# Patient Record
Sex: Female | Born: 1985 | Race: Black or African American | Hispanic: No | Marital: Single
Health system: Southern US, Academic
[De-identification: ages and names within clinical notes are randomized; demographics above are authoritative.]

## PROBLEM LIST (undated history)

## (undated) ENCOUNTER — Inpatient Hospital Stay (HOSPITAL_COMMUNITY): Payer: Self-pay

## (undated) ENCOUNTER — Telehealth

## (undated) ENCOUNTER — Encounter

## (undated) ENCOUNTER — Ambulatory Visit

## (undated) ENCOUNTER — Encounter: Attending: Nephrology | Primary: Nephrology

## (undated) ENCOUNTER — Inpatient Hospital Stay

## (undated) ENCOUNTER — Ambulatory Visit: Attending: Obstetrics & Gynecology | Primary: Obstetrics & Gynecology

## (undated) ENCOUNTER — Ambulatory Visit: Payer: MEDICARE

## (undated) ENCOUNTER — Telehealth: Payer: MEDICARE

## (undated) ENCOUNTER — Encounter: Attending: Obstetrics & Gynecology | Primary: Obstetrics & Gynecology

## (undated) ENCOUNTER — Encounter: Attending: Infectious Disease | Primary: Infectious Disease

## (undated) ENCOUNTER — Telehealth: Attending: Nephrology | Primary: Nephrology

## (undated) ENCOUNTER — Encounter: Attending: Emergency | Primary: Emergency

## (undated) ENCOUNTER — Ambulatory Visit: Payer: MEDICARE | Attending: Nephrology | Primary: Nephrology

## (undated) ENCOUNTER — Ambulatory Visit: Payer: MEDICARE | Attending: Obstetrics & Gynecology | Primary: Obstetrics & Gynecology

## (undated) ENCOUNTER — Encounter: Attending: Physician Assistant | Primary: Physician Assistant

## (undated) ENCOUNTER — Telehealth
Attending: Student in an Organized Health Care Education/Training Program | Primary: Student in an Organized Health Care Education/Training Program

## (undated) ENCOUNTER — Telehealth: Attending: Emergency | Primary: Emergency

## (undated) ENCOUNTER — Telehealth: Attending: Clinical | Primary: Clinical

## (undated) ENCOUNTER — Ambulatory Visit: Payer: MEDICARE | Attending: Infectious Disease | Primary: Infectious Disease

## (undated) ENCOUNTER — Ambulatory Visit: Payer: MEDICARE | Attending: Emergency | Primary: Emergency

## (undated) ENCOUNTER — Encounter: Payer: MEDICARE | Attending: Nephrology | Primary: Nephrology

## (undated) ENCOUNTER — Encounter: Attending: Maternal & Fetal Medicine | Primary: Maternal & Fetal Medicine

## (undated) ENCOUNTER — Encounter: Attending: Clinical | Primary: Clinical

## (undated) ENCOUNTER — Ambulatory Visit: Payer: MEDICARE | Attending: Hematology | Primary: Hematology

## (undated) ENCOUNTER — Encounter
Attending: Student in an Organized Health Care Education/Training Program | Primary: Student in an Organized Health Care Education/Training Program

## (undated) ENCOUNTER — Ambulatory Visit
Attending: Student in an Organized Health Care Education/Training Program | Primary: Student in an Organized Health Care Education/Training Program

## (undated) ENCOUNTER — Other Ambulatory Visit

## (undated) ENCOUNTER — Ambulatory Visit
Payer: MEDICARE | Attending: Student in an Organized Health Care Education/Training Program | Primary: Student in an Organized Health Care Education/Training Program

## (undated) ENCOUNTER — Ambulatory Visit: Payer: MEDICARE | Attending: Clinical | Primary: Clinical

## (undated) ENCOUNTER — Ambulatory Visit: Attending: Nephrology | Primary: Nephrology

## (undated) DIAGNOSIS — R519 Headache, unspecified: Secondary | ICD-10-CM

## (undated) DIAGNOSIS — N189 Chronic kidney disease, unspecified: Secondary | ICD-10-CM

## (undated) DIAGNOSIS — A0472 Enterocolitis due to Clostridium difficile, not specified as recurrent: Secondary | ICD-10-CM

## (undated) DIAGNOSIS — F329 Major depressive disorder, single episode, unspecified: Secondary | ICD-10-CM

## (undated) DIAGNOSIS — D649 Anemia, unspecified: Secondary | ICD-10-CM

## (undated) DIAGNOSIS — D573 Sickle-cell trait: Secondary | ICD-10-CM

## (undated) DIAGNOSIS — I10 Essential (primary) hypertension: Secondary | ICD-10-CM

## (undated) DIAGNOSIS — R51 Headache: Secondary | ICD-10-CM

## (undated) DIAGNOSIS — E11319 Type 2 diabetes mellitus with unspecified diabetic retinopathy without macular edema: Secondary | ICD-10-CM

## (undated) DIAGNOSIS — K219 Gastro-esophageal reflux disease without esophagitis: Secondary | ICD-10-CM

## (undated) DIAGNOSIS — F32A Depression, unspecified: Secondary | ICD-10-CM

## (undated) HISTORY — PX: KIDNEY TRANSPLANT: SHX239

## (undated) HISTORY — DX: Headache, unspecified: R51.9

## (undated) HISTORY — DX: Major depressive disorder, single episode, unspecified: F32.9

## (undated) HISTORY — DX: Sickle-cell trait: D57.3

## (undated) HISTORY — PX: TONSILLECTOMY AND ADENOIDECTOMY: SHX28

## (undated) HISTORY — PX: TONSILLECTOMY: SUR1361

## (undated) HISTORY — DX: Depression, unspecified: F32.A

## (undated) HISTORY — DX: Headache: R51

## (undated) MED ORDER — DIFICID 200 MG TABLET: 200 mg | tablet | Freq: Two times a day (BID) | 2 refills | 30 days

## (undated) MED ORDER — PEG 3350-ELECTROLYTES 236 GRAM-22.74 GRAM-6.74 GRAM-5.86 GRAM SOLUTION: mL | 0 refills | 0 days | Status: CN

---
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
## 2007-08-06 ENCOUNTER — Inpatient Hospital Stay (HOSPITAL_COMMUNITY): Admission: EM | Admit: 2007-08-06 | Discharge: 2007-08-08 | Payer: Self-pay | Admitting: Internal Medicine

## 2007-12-02 ENCOUNTER — Emergency Department (HOSPITAL_COMMUNITY): Admission: EM | Admit: 2007-12-02 | Discharge: 2007-12-02 | Payer: Self-pay | Admitting: Family Medicine

## 2008-02-25 ENCOUNTER — Other Ambulatory Visit: Admission: RE | Admit: 2008-02-25 | Discharge: 2008-02-25 | Payer: Self-pay | Admitting: Obstetrics and Gynecology

## 2009-10-15 ENCOUNTER — Emergency Department (HOSPITAL_COMMUNITY): Admission: EM | Admit: 2009-10-15 | Discharge: 2009-10-15 | Payer: Self-pay | Admitting: Family Medicine

## 2009-10-20 ENCOUNTER — Inpatient Hospital Stay (HOSPITAL_COMMUNITY): Admission: AD | Admit: 2009-10-20 | Discharge: 2009-10-20 | Payer: Self-pay | Admitting: Obstetrics & Gynecology

## 2009-10-20 ENCOUNTER — Ambulatory Visit: Payer: Self-pay | Admitting: Gynecology

## 2010-04-26 LAB — WET PREP, GENITAL: Yeast Wet Prep HPF POC: NONE SEEN

## 2010-04-26 LAB — GLUCOSE, CAPILLARY: Glucose-Capillary: 318 mg/dL — ABNORMAL HIGH (ref 70–99)

## 2010-04-26 LAB — GC/CHLAMYDIA PROBE AMP, GENITAL: Chlamydia, DNA Probe: POSITIVE — AB

## 2010-06-26 NOTE — H&P (Signed)
NAMELAURAASHLEY, [name]:  [id number]   MEDICAL RECORD NO.:  [id number]          PATIENT TYPE:  EMS   LOCATION:  [facility]                         FACILITY:  [facility]   PHYSICIAN:  Edythe Lynn, M.D.       DATE OF BIRTH:  10/08/1985   DATE OF ADMISSION:  08/05/2007  DATE OF DISCHARGE:                              HISTORY & PHYSICAL   PRIMARY CARE PHYSICIAN:  This patient does not have a primary care  physician.   PRIMARY ENDOCRINOLOGIST:  Dr. Elayne Snare.   CHIEF COMPLAINT:  Feeling sick.   HISTORY OF PRESENT ILLNESS:  Christine Reed is a 25 year old woman with past  medical history of diabetes mellitus type 1, who presents today to the  emergency room with complaints of sore throat, feeling sick, and that  was followed by abdominal pain and nausea.  She reports that her CBG  machine was reading high.  The patient states that she has been in  contact with her cousin who was diagnosed with swine flu at [place].  The patient denies any dyspnea or cough.   PAST MEDICAL HISTORY:  Diabetes mellitus type 1 and episode of DKA.   HOME MEDICATIONS:  1. Lantus 25 units twice a day.  2. Humalog sliding scale before each meal.   ALLERGIES:  No known drug allergies.   SOCIAL HISTORY:  The patient goes to college.  She does not smoke  cigarettes.  Does not drink alcohol.   FAMILY HISTORY:  Noncontributory.   REVIEW OF SYSTEMS:  As per HPI, all other systems reviewed negative.   PHYSICAL EXAM:  VITAL SIGNS:  On admission, temperature 101.1, blood  pressure 117/74, heart rate 130, respiratory rate above 25, and  saturation 97% on room air.  GENERAL APPEARANCE:  A well-developed, well-nourished in no acute  distress.  Alert and oriented to place, person, and time.  HEENT:  Head:  Normocephalic and atraumatic.  Pupils are equal, round,  reactive to light and accommodation.  Extraocular movements are intact.  Throat:  Clear.  NECK:  Supple.  No JVD.  CHEST:   Coarse but clear.  No wheezes or rhonchi or crackles.  HEART:  Tachycardiac and regular without murmurs, rubs, or gallops.  ABDOMEN:  Soft.  Minimal diffuse tenderness.  No rebound.  No guarding.  Bowel sounds are present.  LOWER EXTREMITIES:  Without edema.   Exam of the back of the throat indicate presence of some small vesicles.   ASSESSMENT AND PLAN:  1. Diabetic ketoacidosis.  Plan is to obtain STAT BMET to further      document.  The patient has ketones in the urine more than 80 and      she smells of acetone and she has CBGs above 500.  Her i-STAT in      the emergency room documented bicarbonate of 20 but she has got a      gap of about 14.  We will place the patient in step down unit.      Place her on  generous intravenous fluids as  well as oral water and      start her on a insulin drip.  2. Probable influenza.  Christine Reed has been in contact with someone      diagnosed with H1N1 novel influenza virus.  I have discussed her      case with Dr. Lars Mage from the infectious disease and we have      decided treat the patient with Tamiflu 75 mg twice a day for 5      days.  The patient's respiratory status will be closely monitored      and if she decompensates further we will send PCR testing from      nasal secretions to make sure that this indeed is the H1N1      influenza strain.      Edythe Lynn, M.D.  Electronically Signed     SL/MEDQ  D:  08/05/2007  T:  08/06/2007  Job:  [id number]   cc:   Elayne Snare, M.D.

## 2010-06-26 NOTE — Discharge Summary (Signed)
Christine Reed, Christine Reed                 ACCOUNT NO.:  [id number]   MEDICAL RECORD NO.:  [id number]         PATIENT TYPE:  LINP   LOCATION:                               FACILITY:  [facility]   PHYSICIAN:  Hind I Elsaid, MD      DATE OF BIRTH:  Aug 03, 1985   DATE OF ADMISSION:  08/06/2007  DATE OF DISCHARGE:  08/08/2007                               DISCHARGE SUMMARY   DISCHARGE DIAGNOSES:  1. Diabetic ketoacidosis.  2. Diabetes mellitus type 1.  3. Dehydration.  4. H1N1 virus,  swine flu positive.   MEDICATIONS:  1. Insulin Lantus 28 units in the morning and 25 units at bedtime.  2. Humalog 3 units t.i.d. subcu.  3. Insulin sliding-scale coverage.  4. Tamiflu 75 mg twice daily for 3 days.   DIAGNOSTICS:  Chest x-ray:  Bronchitic changes.   CONSULTATIONS:  None.   HISTORY OF PRESENT ILLNESS:  1. This is a 25 year old female with past medical history significant      for diabetes mellitus type 1, who presented to the emergency room      withfeeling sick, upper quadrant abdominal pain and nausea.  Her      CBG machine was reading very high.  She was admitted to step down .      The patient found to be in DKA.  The patient admitted to the      hospital and started on IV fluids and IV insulin drip with frequent      CBG.  The patient out of DKA and started resuming her diabetic      regimen.  Her CBG returned to baseline of 103 to 98.  Her      hemoglobin found to be 10.1 which is significantly high.  The      patient will be discharged with insulin Lantus 28 units subcu. in      the morning and 25 units subcu. at bedtime.  The patient was      advised to follow next week with Dr. Elayne Snare for further      adjustment of her CBG.  2. Possible H1N1 flu virus positive.  The patient started on Tamiflu.      At this time, H1N1 result is positive.  The patient was advised to      stay home and avoid public place during this duration of time.      During hospitalization, respiratory status  remained stable.  No      evidence of deteriorating on her breathing.   DISPOSITION:  The patient will be discharged home.   FOLLOW UP:  The patient will be asked to follow up with Dr. Elayne Snare  within 1 week.      Hind Franco Collet, MD  Electronically Signed     HIE/MEDQ  D:  08/08/2007  T:  08/08/2007  Job:  [id number]   cc:   Elayne Snare, M.D.  Fax: [phone]

## 2010-10-09 ENCOUNTER — Emergency Department (HOSPITAL_COMMUNITY)
Admission: EM | Admit: 2010-10-09 | Discharge: 2010-10-10 | Disposition: A | Discharge status: Home / Self Care | Payer: 59 | Attending: Emergency Medicine | Admitting: Emergency Medicine

## 2010-10-09 DIAGNOSIS — E109 Type 1 diabetes mellitus without complications: Secondary | ICD-10-CM | POA: Insufficient documentation

## 2010-10-09 DIAGNOSIS — Z794 Long term (current) use of insulin: Secondary | ICD-10-CM | POA: Insufficient documentation

## 2010-10-09 DIAGNOSIS — Z91199 Patient's noncompliance with other medical treatment and regimen due to unspecified reason: Secondary | ICD-10-CM | POA: Insufficient documentation

## 2010-10-09 DIAGNOSIS — Z9119 Patient's noncompliance with other medical treatment and regimen: Secondary | ICD-10-CM | POA: Insufficient documentation

## 2010-10-09 LAB — DIFFERENTIAL
Basophils Absolute: 0 10*3/uL (ref 0.0–0.1)
Eosinophils Absolute: 0.1 10*3/uL (ref 0.0–0.7)
Lymphocytes Relative: 25 % (ref 12–46)
Lymphs Abs: 3 10*3/uL (ref 0.7–4.0)
Neutrophils Relative %: 71 % (ref 43–77)

## 2010-10-09 LAB — POCT I-STAT, CHEM 8
BUN: 13 mg/dL (ref 6–23)
Calcium, Ion: 1.16 mmol/L (ref 1.12–1.32)
Chloride: 101 mEq/L (ref 96–112)
Potassium: 4.3 mEq/L (ref 3.5–5.1)

## 2010-10-09 LAB — GLUCOSE, CAPILLARY
Glucose-Capillary: 498 mg/dL — ABNORMAL HIGH (ref 70–99)
Glucose-Capillary: 515 mg/dL — ABNORMAL HIGH (ref 70–99)

## 2010-10-09 LAB — CBC
HCT: 34.2 % — ABNORMAL LOW (ref 36.0–46.0)
MCV: 84.2 fL (ref 78.0–100.0)
Platelets: 386 10*3/uL (ref 150–400)
RBC: 4.06 MIL/uL (ref 3.87–5.11)
WBC: 12.4 10*3/uL — ABNORMAL HIGH (ref 4.0–10.5)

## 2010-10-10 LAB — GLUCOSE, CAPILLARY: Glucose-Capillary: 307 mg/dL — ABNORMAL HIGH (ref 70–99)

## 2010-11-08 LAB — URINALYSIS, ROUTINE W REFLEX MICROSCOPIC
Glucose, UA: >1000 — AB
Ketones, ur: >80 — AB
Leukocytes, UA: NEGATIVE
Nitrite: NEGATIVE
Protein, ur: NEGATIVE
Urobilinogen, UA: 0.2

## 2010-11-08 LAB — DIFFERENTIAL
Basophils Absolute: 0
Basophils Relative: 0
Basophils Relative: 0
Eosinophils Absolute: 0.1
Eosinophils Relative: 1
Lymphocytes Relative: 17
Lymphs Abs: 0.7
Neutro Abs: 4.8
Neutrophils Relative %: 69
Neutrophils Relative %: 82 — ABNORMAL HIGH

## 2010-11-08 LAB — BASIC METABOLIC PANEL
BUN: 2 — ABNORMAL LOW
BUN: 5 — ABNORMAL LOW
BUN: 6
CO2: 15 — ABNORMAL LOW
CO2: 21
CO2: 22
CO2: 23
CO2: 24
CO2: 24
CO2: 24
CO2: 26
CO2: 27
Calcium: 8.2 — ABNORMAL LOW
Calcium: 8.4
Calcium: 8.7
Chloride: 104
Chloride: 104
Chloride: 104
Chloride: 104
Chloride: 105
Chloride: 106
Chloride: 108
Chloride: 108
Chloride: 108
Chloride: 109
Chloride: 112
Creatinine, Ser: 1.15
GFR calc Af Amer: >60
GFR calc Af Amer: >60
GFR calc Af Amer: >60
GFR calc Af Amer: >60
GFR calc Af Amer: >60
GFR calc Af Amer: >60
GFR calc Af Amer: >60
GFR calc Af Amer: >60
GFR calc non Af Amer: >60
GFR calc non Af Amer: >60
GFR calc non Af Amer: >60
GFR calc non Af Amer: >60
GFR calc non Af Amer: >60
GFR calc non Af Amer: >60
GFR calc non Af Amer: >60
Glucose, Bld: 281 — ABNORMAL HIGH
Glucose, Bld: 135 — ABNORMAL HIGH
Glucose, Bld: 138 — ABNORMAL HIGH
Glucose, Bld: 166 — ABNORMAL HIGH
Glucose, Bld: 171 — ABNORMAL HIGH
Glucose, Bld: 80
Potassium: 2.9 — ABNORMAL LOW
Potassium: 3.1 — ABNORMAL LOW
Potassium: 3.2 — ABNORMAL LOW
Potassium: 3.4 — ABNORMAL LOW
Potassium: 3.5
Potassium: 3.5
Potassium: 3.6
Potassium: 3.6
Potassium: 3.7
Potassium: 3.8
Potassium: 3.8
Potassium: 4.1
Sodium: 139
Sodium: 136
Sodium: 138
Sodium: 138
Sodium: 139
Sodium: 139
Sodium: 139
Sodium: 140
Sodium: 140
Sodium: 140
Sodium: 140

## 2010-11-08 LAB — IRON AND TIBC
Saturation Ratios: 8 — ABNORMAL LOW
TIBC: 293

## 2010-11-08 LAB — CBC
HCT: 37.7
HCT: 28.7 — ABNORMAL LOW
Hemoglobin: 10.1 — ABNORMAL LOW
Hemoglobin: 9.8 — ABNORMAL LOW
MCHC: 34.5
MCHC: 35.1
MCHC: 34.1
MCHC: 34.1
MCV: 90.1
Platelets: 301
RBC: 3.22 — ABNORMAL LOW
RBC: 3.2 — ABNORMAL LOW
RDW: 13.7
RDW: 13.3
WBC: 4.9

## 2010-11-08 LAB — POCT I-STAT, CHEM 8
Calcium, Ion: 1.08 — ABNORMAL LOW
Creatinine, Ser: 0.9
Glucose, Bld: 579
HCT: 39
Hemoglobin: 13.3
TCO2: 20

## 2010-11-08 LAB — RETICULOCYTES
RBC.: 3.57 — ABNORMAL LOW
Retic Count, Absolute: 39.3

## 2010-11-08 LAB — URINE MICROSCOPIC-ADD ON

## 2010-11-08 LAB — H1N1 SCREEN (PCR): H1N1 Virus Scrn: DETECTED

## 2010-11-08 LAB — FOLATE: Folate: 10

## 2011-06-02 ENCOUNTER — Emergency Department (HOSPITAL_COMMUNITY): Payer: 59

## 2011-06-02 ENCOUNTER — Encounter (HOSPITAL_COMMUNITY): Payer: Self-pay | Admitting: Emergency Medicine

## 2011-06-02 ENCOUNTER — Emergency Department (HOSPITAL_COMMUNITY)
Admission: EM | Admit: 2011-06-02 | Discharge: 2011-06-02 | Disposition: A | Discharge status: Home / Self Care | Payer: 59 | Attending: Emergency Medicine | Admitting: Emergency Medicine

## 2011-06-02 DIAGNOSIS — M545 Low back pain, unspecified: Secondary | ICD-10-CM | POA: Insufficient documentation

## 2011-06-02 DIAGNOSIS — E119 Type 2 diabetes mellitus without complications: Secondary | ICD-10-CM | POA: Insufficient documentation

## 2011-06-02 DIAGNOSIS — R51 Headache: Secondary | ICD-10-CM | POA: Insufficient documentation

## 2011-06-02 DIAGNOSIS — M4850XA Collapsed vertebra, not elsewhere classified, site unspecified, initial encounter for fracture: Secondary | ICD-10-CM

## 2011-06-02 DIAGNOSIS — M79609 Pain in unspecified limb: Secondary | ICD-10-CM | POA: Insufficient documentation

## 2011-06-02 DIAGNOSIS — R739 Hyperglycemia, unspecified: Secondary | ICD-10-CM

## 2011-06-02 DIAGNOSIS — M546 Pain in thoracic spine: Secondary | ICD-10-CM | POA: Insufficient documentation

## 2011-06-02 DIAGNOSIS — R079 Chest pain, unspecified: Secondary | ICD-10-CM | POA: Insufficient documentation

## 2011-06-02 DIAGNOSIS — S22009A Unspecified fracture of unspecified thoracic vertebra, initial encounter for closed fracture: Secondary | ICD-10-CM | POA: Insufficient documentation

## 2011-06-02 LAB — CBC
HCT: 35 % — ABNORMAL LOW (ref 36.0–46.0)
MCH: 29.3 pg (ref 26.0–34.0)
MCHC: 34.3 g/dL (ref 30.0–36.0)
MCV: 85.4 fL (ref 78.0–100.0)
RDW: 13.6 % (ref 11.5–15.5)

## 2011-06-02 LAB — DIFFERENTIAL
Basophils Absolute: 0 10*3/uL (ref 0.0–0.1)
Basophils Relative: 0 % (ref 0–1)
Eosinophils Absolute: 0 10*3/uL (ref 0.0–0.7)
Eosinophils Relative: 0 % (ref 0–5)
Monocytes Absolute: 1.4 10*3/uL — ABNORMAL HIGH (ref 0.1–1.0)
Monocytes Relative: 6 % (ref 3–12)
Neutro Abs: 21.1 10*3/uL — ABNORMAL HIGH (ref 1.7–7.7)

## 2011-06-02 LAB — BLOOD GAS, VENOUS
Bicarbonate: 21.3 mEq/L (ref 20.0–24.0)
FIO2: 0.21 %
pCO2, Ven: 39.6 mmHg — ABNORMAL LOW (ref 45.0–50.0)
pH, Ven: 7.35 — ABNORMAL HIGH (ref 7.250–7.300)
pO2, Ven: 53.8 mmHg — ABNORMAL HIGH (ref 30.0–45.0)

## 2011-06-02 LAB — URINALYSIS, ROUTINE W REFLEX MICROSCOPIC
Bilirubin Urine: NEGATIVE
Ketones, ur: 40 mg/dL — AB
Nitrite: NEGATIVE
Urobilinogen, UA: 0.2 mg/dL (ref 0.0–1.0)

## 2011-06-02 LAB — URINE MICROSCOPIC-ADD ON

## 2011-06-02 LAB — BASIC METABOLIC PANEL
BUN: 11 mg/dL (ref 6–23)
Chloride: 98 mEq/L (ref 96–112)
Creatinine, Ser: 0.63 mg/dL (ref 0.50–1.10)
GFR calc Af Amer: >90 mL/min (ref >90)
Glucose, Bld: 605 mg/dL (ref 70–99)

## 2011-06-02 LAB — POCT PREGNANCY, URINE: Preg Test, Ur: NEGATIVE

## 2011-06-02 LAB — GLUCOSE, CAPILLARY
Glucose-Capillary: >600 mg/dL (ref 70–99)
Glucose-Capillary: 370 mg/dL — ABNORMAL HIGH (ref 70–99)
Glucose-Capillary: 239 mg/dL — ABNORMAL HIGH (ref 70–99)

## 2011-06-02 MED ORDER — HYDROCODONE-ACETAMINOPHEN 5-325 MG PO TABS
1.0000 | ORAL_TABLET | Freq: Once | ORAL | Status: AC
  Administered 2011-06-02: 1 via ORAL
  Filled 2011-06-02: qty 1

## 2011-06-02 MED ORDER — HYDROCODONE-ACETAMINOPHEN 5-325 MG PO TABS: 1.0000 | ORAL_TABLET | Freq: Four times a day (QID) | ORAL | Status: AC | PRN

## 2011-06-02 MED ORDER — ONDANSETRON HCL 4 MG/2ML IJ SOLN: 4.0000 mg | Freq: Once | INTRAMUSCULAR | Status: DC

## 2011-06-02 MED ORDER — SODIUM CHLORIDE 0.9 % IV BOLUS (SEPSIS)
1000.0000 mL | Freq: Once | INTRAVENOUS | Status: AC
  Administered 2011-06-02: 1000 mL via INTRAVENOUS

## 2011-06-02 MED ORDER — SODIUM CHLORIDE 0.9 % IV SOLN
INTRAVENOUS | Status: DC
  Administered 2011-06-02: 4.9 [IU]/h via INTRAVENOUS
  Administered 2011-06-02: 2.1 [IU]/h via INTRAVENOUS
  Administered 2011-06-02: 3.1 [IU]/h via INTRAVENOUS
  Administered 2011-06-02: 5.5 [IU]/h via INTRAVENOUS
  Filled 2011-06-02: qty 1

## 2011-06-02 MED ORDER — FLUCONAZOLE 150 MG PO TABS
150.0000 mg | ORAL_TABLET | Freq: Once | ORAL | Status: AC
  Administered 2011-06-02: 150 mg via ORAL
  Filled 2011-06-02: qty 1
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
## 2011-06-02 NOTE — ED Notes (Signed)
2nd attempt to obtain labs pt remains in x-ray

## 2011-06-02 NOTE — ED Notes (Signed)
[id number] Expected date:<BR> Expected time:<BR> Means of arrival:<BR> Comments:<BR> For [facility] pt
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
## 2011-06-02 NOTE — ED Notes (Signed)
Pt now calm and resting, mother at bedside. Pt denies nausea at this time, will hold Zofran, call bell within reach.

## 2011-06-02 NOTE — ED Notes (Addendum)
Pt POC CBG reading HI. [facility] RN notified of results
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
## 2011-06-18 NOTE — ED Notes (Signed)
Patient brought in FMLA paper to be filled out.Unable to fill out/need to be filled out by PCP.Several attempts made to contact patient to notify her of same without any success.

## 2012-02-18 ENCOUNTER — Ambulatory Visit: Payer: 59 | Admitting: Emergency Medicine

## 2012-02-18 VITALS — BP 112/68 | HR 114 | Temp 98.7°F | Resp 16 | Ht 65.5 in | Wt 150.0 lb

## 2012-02-18 DIAGNOSIS — Z91199 Patient's noncompliance with other medical treatment and regimen due to unspecified reason: Secondary | ICD-10-CM

## 2012-02-18 DIAGNOSIS — E1065 Type 1 diabetes mellitus with hyperglycemia: Secondary | ICD-10-CM

## 2012-02-18 DIAGNOSIS — Z9119 Patient's noncompliance with other medical treatment and regimen: Secondary | ICD-10-CM

## 2012-02-18 DIAGNOSIS — IMO0002 Reserved for concepts with insufficient information to code with codable children: Secondary | ICD-10-CM

## 2012-02-18 DIAGNOSIS — L02214 Cutaneous abscess of groin: Secondary | ICD-10-CM

## 2012-02-18 DIAGNOSIS — L02219 Cutaneous abscess of trunk, unspecified: Secondary | ICD-10-CM

## 2012-02-18 DIAGNOSIS — IMO0001 Reserved for inherently not codable concepts without codable children: Secondary | ICD-10-CM

## 2012-02-18 LAB — POCT CBC
Lymph, poc: 2.5 (ref 0.6–3.4)
MCH, POC: 28.9 pg (ref 27–31.2)
MCHC: 32.4 g/dL (ref 31.8–35.4)
MCV: 89.1 fL (ref 80–97)
MID (cbc): 0.7 (ref 0–0.9)
POC LYMPH PERCENT: 17.8 %L (ref 10–50)
POC MID %: 4.7 %M (ref 0–12)
Platelet Count, POC: 366 10*3/uL (ref 142–424)
RBC: 4.12 M/uL (ref 4.04–5.48)
WBC: 14.2 10*3/uL — AB (ref 4.6–10.2)

## 2012-02-18 LAB — COMPREHENSIVE METABOLIC PANEL
Albumin: 3.7 g/dL (ref 3.5–5.2)
Alkaline Phosphatase: 107 U/L (ref 39–117)
BUN: 10 mg/dL (ref 6–23)
Calcium: 9.3 mg/dL (ref 8.4–10.5)
Creat: 0.76 mg/dL (ref 0.50–1.10)
Glucose, Bld: 393 mg/dL — ABNORMAL HIGH (ref 70–99)
Potassium: 4.6 mEq/L (ref 3.5–5.3)

## 2012-02-18 LAB — LIPID PANEL
HDL: 66 mg/dL (ref >39)
Total CHOL/HDL Ratio: 2.6 Ratio
Triglycerides: 127 mg/dL (ref <150)

## 2012-02-18 MED ORDER — HYDROCODONE-ACETAMINOPHEN 5-325 MG PO TABS: 1.0000 | ORAL_TABLET | ORAL | Status: DC | PRN

## 2012-02-18 MED ORDER — SULFAMETHOXAZOLE-TRIMETHOPRIM 800-160 MG PO TABS: 1.0000 | ORAL_TABLET | Freq: Two times a day (BID) | ORAL | Status: DC

## 2012-02-18 NOTE — Progress Notes (Signed)
[facility] [address] [place] [address] [phone]  Date:  02/18/2012   Name:  Christine Reed   DOB:  1985/12/01   MRN:  [id number]  PCP:  No primary provider on file.    Chief Complaint: Cyst   History of Present Illness:  Christine Reed is a 27 y.o. very pleasant female patient who presents with the following:  Noncompliant IDDM with abscess left mons.  Draining spontaneously at home.  No fever or chills.  Says her sugar is elevated but normally runs in 300-400 range.  Does not see an endocrinologist  There is no problem list on file for this patient.   Past Medical History  Diagnosis Date  . Diabetes mellitus     History reviewed. No pertinent past surgical history.  History  Substance Use Topics  . Smoking status: Never Smoker   . Smokeless tobacco: Not on file  . Alcohol Use: No    Family History  Problem Relation Age of Onset  . Cancer Maternal Grandmother   . Diabetes Maternal Grandfather     No Known Allergies  Medication list has been reviewed and updated.  Current Outpatient Prescriptions on File Prior to Visit  Medication Sig Dispense Refill  . insulin glargine (LANTUS) 100 UNIT/ML injection Inject into the skin at bedtime.      . insulin lispro (HUMALOG) 100 UNIT/ML injection Inject into the skin 3 (three) times daily before meals. Sliding scale        Review of Systems:  As per HPI, otherwise negative.    Physical Examination: Filed Vitals:   02/18/12 0931  BP: 112/68  Pulse: 114  Temp: 98.7 F (37.1 C)  Resp: 16   Filed Vitals:   02/18/12 0931  Height: 5' 5.5" (1.664 m)  Weight: 150 lb (68.04 kg)   Body mass index is 24.58 kg/(m^2). Ideal Body Weight: Weight in (lb) to have BMI = 25: 152.2    GEN: WDWN, NAD, Non-toxic, Alert & Oriented x 3 HEENT: Atraumatic, Normocephalic.  Ears and Nose: No external deformity. EXTR: No clubbing/cyanosis/edema NEURO: Normal gait.  PSYCH: Normally interactive.  Conversant. Not depressed or anxious appearing.  Calm demeanor.  GROIN:  Left mons abscess.  3 x 2 cm  Assessment and Plan: Aspirated and sent for culture Noncompliant IDDM Abscess groin Labs Septra vicodin Follow up tomorrow. Endocrine consult for management  Roselee Culver, MD

## 2012-02-18 NOTE — Patient Instructions (Signed)
Abscess An abscess is an infected area that contains a collection of pus and debris. It can occur in almost any part of the body. An abscess is also known as a furuncle or boil. CAUSES   An abscess occurs when tissue gets infected. This can occur from blockage of oil or sweat glands, infection of hair follicles, or a minor injury to the skin. As the body tries to fight the infection, pus collects in the area and creates pressure under the skin. This pressure causes pain. People with weakened immune systems have difficulty fighting infections and get certain abscesses more often.   SYMPTOMS Usually an abscess develops on the skin and becomes a painful mass that is red, warm, and tender. If the abscess forms under the skin, you may feel a moveable soft area under the skin. Some abscesses break open (rupture) on their own, but most will continue to get worse without care. The infection can spread deeper into the body and eventually into the bloodstream, causing you to feel ill.   DIAGNOSIS   Your caregiver will take your medical history and perform a physical exam. A sample of fluid may also be taken from the abscess to determine what is causing your infection. TREATMENT   Your caregiver may prescribe antibiotic medicines to fight the infection. However, taking antibiotics alone usually does not cure an abscess. Your caregiver may need to make a small cut (incision) in the abscess to drain the pus. In some cases, gauze is packed into the abscess to reduce pain and to continue draining the area. HOME CARE INSTRUCTIONS    Only take over-the-counter or prescription medicines for pain, discomfort, or fever as directed by your caregiver.   If you were prescribed antibiotics, take them as directed. Finish them even if you start to feel better.   If gauze is used, follow your caregiver's directions for changing the gauze.   To avoid spreading the infection:   Keep your draining abscess covered with a  bandage.   Wash your hands well.   Do not share personal care items, towels, or whirlpools with others.   Avoid skin contact with others.   Keep your skin and clothes clean around the abscess.   Keep all follow-up appointments as directed by your caregiver.  SEEK MEDICAL CARE IF:    You have increased pain, swelling, redness, fluid drainage, or bleeding.   You have muscle aches, chills, or a general ill feeling.   You have a fever.  MAKE SURE YOU:    Understand these instructions.   Will watch your condition.   Will get help right away if you are not doing well or get worse.  Document Released: 11/07/2004 Document Revised: 07/30/2011 Document Reviewed: 04/12/2011 [employer] Patient Information 2013 [employer].    

## 2012-02-18 NOTE — Progress Notes (Signed)
Reviewed and agree.

## 2012-02-19 ENCOUNTER — Ambulatory Visit (INDEPENDENT_AMBULATORY_CARE_PROVIDER_SITE_OTHER): Payer: 59 | Admitting: Emergency Medicine

## 2012-02-19 VITALS — BP 118/80 | HR 99 | Temp 98.2°F | Resp 16 | Ht 66.0 in | Wt 155.2 lb

## 2012-02-19 DIAGNOSIS — L02219 Cutaneous abscess of trunk, unspecified: Secondary | ICD-10-CM

## 2012-02-19 DIAGNOSIS — E1010 Type 1 diabetes mellitus with ketoacidosis without coma: Secondary | ICD-10-CM | POA: Insufficient documentation

## 2012-02-19 DIAGNOSIS — E1065 Type 1 diabetes mellitus with hyperglycemia: Secondary | ICD-10-CM

## 2012-02-19 DIAGNOSIS — L02214 Cutaneous abscess of groin: Secondary | ICD-10-CM

## 2012-02-19 DIAGNOSIS — IMO0001 Reserved for inherently not codable concepts without codable children: Secondary | ICD-10-CM

## 2012-02-19 DIAGNOSIS — IMO0002 Reserved for concepts with insufficient information to code with codable children: Secondary | ICD-10-CM

## 2012-02-19 MED ORDER — AMOXICILLIN 500 MG PO CAPS: 500.0000 mg | ORAL_CAPSULE | Freq: Three times a day (TID) | ORAL | Status: DC

## 2012-02-19 NOTE — Progress Notes (Signed)
[facility] [address], [place] [address] [phone]  Date:  02/19/2012   Name:  Christine Reed   DOB:  [birth year]   MRN:  [id number]  PCP:  No primary provider on file.    Chief Complaint: Wound Check   History of Present Illness:  Christine Reed is a 27 y.o. very pleasant female patient who presents with the following:  Wound check.  Little interval improvement since yesterday.  Not willing to have I&D.  No fever or chills.  There is no problem list on file for this patient.   Past Medical History  Diagnosis Date  . Diabetes mellitus     No past surgical history on file.  History  Substance Use Topics  . Smoking status: Never Smoker   . Smokeless tobacco: Not on file  . Alcohol Use: No    Family History  Problem Relation Age of Onset  . Cancer Maternal Grandmother   . Diabetes Maternal Grandfather     No Known Allergies  Medication list has been reviewed and updated.  Current Outpatient Prescriptions on File Prior to Visit  Medication Sig Dispense Refill  . HYDROcodone-acetaminophen (NORCO) 5-325 MG per tablet Take 1 tablet by mouth every 4 (four) hours as needed for pain.  30 tablet  0  . insulin glargine (LANTUS) 100 UNIT/ML injection Inject into the skin at bedtime.      . insulin lispro (HUMALOG) 100 UNIT/ML injection Inject into the skin 3 (three) times daily before meals. Sliding scale      . sulfamethoxazole-trimethoprim (BACTRIM DS,SEPTRA DS) 800-160 MG per tablet Take 1 tablet by mouth 2 (two) times daily.  20 tablet  0    Review of Systems:  As per HPI, otherwise negative.    Physical Examination: Filed Vitals:   02/19/12 1543  BP: 118/80  Pulse: 99  Temp: 98.2 F (36.8 C)  Resp: 16   Filed Vitals:   02/19/12 1543  Height: 5\' 6"  (1.676 m)  Weight: 155 lb 3.2 oz (70.398 kg)   Body mass index is 25.05 kg/(m^2). Ideal Body Weight: Weight in (lb) to have BMI = 25: 154.6    GEN: WDWN, NAD, Non-toxic, Alert &  Oriented x 3 HEENT: Atraumatic, Normocephalic.  Ears and Nose: No external deformity. EXTR: No clubbing/cyanosis/edema NEURO: Normal gait.  PSYCH: Normally interactive. Conversant. Not depressed or anxious appearing.  Calm demeanor.  GENITALIA:  Tender mass in pubis.  Draining.  Some improvement with treatment.  Assessment and Plan: Abscess Culture favors antibiotic change will add amoxicillin Follow up for failure to improve tomorrow  Roselee Culver, MD

## 2012-02-20 ENCOUNTER — Ambulatory Visit (INDEPENDENT_AMBULATORY_CARE_PROVIDER_SITE_OTHER): Payer: 59 | Admitting: Physician Assistant

## 2012-02-20 VITALS — BP 121/79 | HR 103 | Temp 97.5°F | Resp 18 | Ht 66.0 in | Wt 155.0 lb

## 2012-02-20 DIAGNOSIS — R1032 Left lower quadrant pain: Secondary | ICD-10-CM

## 2012-02-20 DIAGNOSIS — L0291 Cutaneous abscess, unspecified: Secondary | ICD-10-CM

## 2012-02-20 LAB — WOUND CULTURE

## 2012-02-20 NOTE — Patient Instructions (Signed)
Apply at warm compress to the area for 15-20 minutes 3-4 times daily.  Since the hydrocodone is causing nausea, use ibuprofen as needed for pain.  Continue the antibiotic as prescribed and work to get your blood sugar under control.

## 2012-02-20 NOTE — Progress Notes (Signed)
  Subjective:    Patient ID: Christine Reed, female    DOB: 1986/02/02, 27 y.o.   MRN: [id number]  HPI This 27 y.o. female presents for evaluation of cellulitis/abscess of the LEFT mons pubis.  See Dr. Tonette Bihari previous notes.  She has refused I&D at visits the last several days.  Wound culture was taken after needle aspiration at a previous visit, revealing Group B strep.  She's tolerating the antibiotics well, but has nausea with hydrocodone so has stopped taking it.  Her cousin, who is an NP at [facility] [employer], is present with her today.  No fever, chills.  She reports less pain today than yesterday.  She is reluctantly ready for I&D today.   Past Medical History  Diagnosis Date  . Diabetes mellitus     History reviewed. No pertinent past surgical history.  Prior to Admission medications   Medication Sig Start Date End Date Taking? Authorizing Provider  amoxicillin (AMOXIL) 500 MG capsule Take 1 capsule (500 mg total) by mouth 3 (three) times daily. 02/19/12  Yes Ellison Carwin, MD  insulin glargine (LANTUS) 100 UNIT/ML injection Inject into the skin at bedtime.   Yes Historical Provider, MD  insulin lispro (HUMALOG) 100 UNIT/ML injection Inject into the skin 3 (three) times daily before meals. Sliding scale   Yes Historical Provider, MD  sulfamethoxazole-trimethoprim (BACTRIM DS,SEPTRA DS) 800-160 MG per tablet Take 1 tablet by mouth 2 (two) times daily. 02/18/12  Yes Ellison Carwin, MD  HYDROcodone-acetaminophen (NORCO) 5-325 MG per tablet Take 1 tablet by mouth every 4 (four) hours as needed for pain. 02/18/12   Ellison Carwin, MD    No Known Allergies  History   Social History  . Marital Status: Single    Spouse Name: N/A    Number of Children: N/A  . Years of Education: N/A   Occupational History  . Call center   Social History Main Topics  . Smoking status: Never Smoker   . Smokeless tobacco: Not on file  . Alcohol Use: No  . Drug Use: No   Family History    Problem Relation Age of Onset  . Cancer Maternal Grandmother   . Diabetes Maternal Grandfather    Review of Systems As above.    Objective:   Physical Exam BP 121/79  Pulse 103  Temp 97.5 F (36.4 C) (Oral)  Resp 18  Ht 5\' 6"  (1.676 m)  Wt 155 lb (70.308 kg)  BMI 25.02 kg/m2  SpO2 99%  LMP 01/27/2012 WDWNBF, A&O x 3.  Verbal Consent Obtained. Local anesthesia with 4 cc of 2% lidocaine plain.  11 blade used to incise the lesion centrally.  Purulence expressed. Irrigated wound with 6 cc of 2% lidocaine and packed with 1/4 inch plain packing.  Cleansed and dressed.     Assessment & Plan:   1. Cellulitis and abscess   2. Left groin pain    Continue antibiotics.  Warm compresses.  RTC 48 hours for wound care, sooner if needed.

## 2012-02-22 ENCOUNTER — Ambulatory Visit (INDEPENDENT_AMBULATORY_CARE_PROVIDER_SITE_OTHER): Payer: 59 | Admitting: Physician Assistant

## 2012-02-22 VITALS — BP 110/80 | HR 110 | Temp 98.1°F | Resp 16 | Ht 66.0 in | Wt 155.0 lb

## 2012-02-22 DIAGNOSIS — L02219 Cutaneous abscess of trunk, unspecified: Secondary | ICD-10-CM

## 2012-02-22 DIAGNOSIS — L03319 Cellulitis of trunk, unspecified: Secondary | ICD-10-CM

## 2012-02-22 NOTE — Progress Notes (Signed)
Reviewed and agree.

## 2012-02-22 NOTE — Progress Notes (Signed)
  Subjective:    Patient ID: Christine Reed, female    DOB: August 02, 1985, 28 y.o.   MRN: [id number]  HPI This 27 y.o. female presents for evaluation of cellulitis/abscess LEFT groin/mons pubis, s/p I&D 02/20/2012.  She was initially started on Septra empirically, and then advised to change to Amoxicillin when the results of a culture obtained via needle aspiration revealed Group B strep.  Unfortunately, she did not start the Amoxicillin.  She notes some improvement since the I&D, but the area is still tender and she has pain when she's up for very long. No fever, chills.  Review of Systems As above.    Objective:   Physical Exam BP 110/80  Pulse 110  Temp 98.1 F (36.7 C) (Oral)  Resp 16  Ht 5\' 6"  (1.676 m)  Wt 155 lb (70.308 kg)  BMI 25.02 kg/m2  SpO2 100%  LMP 01/27/2012 WDWNBF, A&O x 3.  Accompanied by her mother. Dressing and packing removed.  No erythema.  LEFT inguinal nodes remain palpable, but are less tender and smaller than last exam.  Induration also lessened, but the area is quite tender. Small amount of purulence and moderate blood expressed.  Irrigated with 3 cc 1% lidocaine plain.  Gently repacked with 1/4 inch plain packing.  Dressed.      Assessment & Plan:   1. Cellulitis and abscess of trunk    Patient Instructions  Start the Amoxicillin. Continue using the warm compresses.   RTC 2 days for wound care.

## 2012-02-22 NOTE — Patient Instructions (Signed)
Start the Amoxicillin. Continue using the warm compresses.

## 2012-02-24 ENCOUNTER — Ambulatory Visit: Payer: 59 | Admitting: Physician Assistant

## 2012-02-24 VITALS — BP 116/80 | HR 109 | Temp 97.5°F | Resp 18 | Ht 65.5 in | Wt 149.9 lb

## 2012-02-24 DIAGNOSIS — L02219 Cutaneous abscess of trunk, unspecified: Secondary | ICD-10-CM

## 2012-02-24 DIAGNOSIS — L03319 Cellulitis of trunk, unspecified: Secondary | ICD-10-CM

## 2012-02-24 NOTE — Progress Notes (Signed)
  Subjective:    Patient ID: Christine Reed, female    DOB: [birth year], 27 y.o.   MRN: [id number]  HPI   Christine Reed is a 27 yr old female here for follow up on abscess I&D'd here 02/20/12.  Pt states overall she feels that she is improving but that "today is not a good day".  States she has been hurting more, feels "intense pressure" when she stands up.  Has been using 600mg  ibuprofen BID for pain relief.  Did not tolerate the Norco as originally prescribed.  States that she is taking the amoxicillin as directed and tolerating it well.  Denies nausea, vomiting, fever or chills.  Does state that her period started yesterday and is heavier than normal, wonders if this is related.    Review of Systems  Constitutional: Negative for fever and chills.  HENT: Negative.   Respiratory: Negative.   Cardiovascular: Negative.   Gastrointestinal: Negative.   Musculoskeletal: Negative.   Skin: Positive for wound (mons pubis).  Neurological: Negative.        Objective:   Physical Exam  Vitals reviewed. Constitutional: She is oriented to person, place, and time. She appears well-developed and well-nourished. No distress.  HENT:  Head: Normocephalic and atraumatic.  Eyes: Conjunctivae normal are normal. No scleral icterus.  Pulmonary/Chest: Effort normal.  Neurological: She is alert and oriented to person, place, and time.  Skin: Skin is warm and dry.     Psychiatric: She has a normal mood and affect. Her behavior is normal.      Filed Vitals:   02/24/12 2030  BP: 116/80  Pulse: 109  Temp: 97.5 F (36.4 C)  Resp: 18        Wound Care: Dressing and packing removed.  Packing saturated with blood.  Unable to express any purulent material from the wound.  The area is still quite tender.  Irrigated with 6cc 2% plain lidocaine.  Gently repacked with 1/4 inch plain packing.  The wound still accommodates several centimeters packing.  Dressing applied.  Pt tolerated well.    Assessment & Plan:    1. Cellulitis and abscess of trunk     Christine Reed is a 27 yr old female here for recheck of abscess that was drained 02/20/12.  Pt states that she is improving overall.  Still exquisitely tender. Unable to express any purulence from the wound.  Gently repacked and dressed.  Encouraged pt to continue full course of abx and to continue warm compresses.  Will have her RTC in 48 hours for recheck/repacking.  Fast track card given.

## 2012-02-26 ENCOUNTER — Ambulatory Visit (INDEPENDENT_AMBULATORY_CARE_PROVIDER_SITE_OTHER): Payer: 59 | Admitting: Physician Assistant

## 2012-02-26 VITALS — BP 116/76 | HR 97 | Temp 98.4°F | Resp 16 | Ht 65.0 in | Wt 153.0 lb

## 2012-02-26 DIAGNOSIS — L02219 Cutaneous abscess of trunk, unspecified: Secondary | ICD-10-CM

## 2012-02-26 DIAGNOSIS — L03319 Cellulitis of trunk, unspecified: Secondary | ICD-10-CM

## 2012-02-26 NOTE — Progress Notes (Signed)
Patient ID: Christine Reed MRN: [id number], DOB: 02-Mar-1985 27 y.o. Date of Encounter: 02/26/2012, 7:52 PM  Chief Complaint: Wound care   See previous note  HPI: 27 y.o. y/o female presents for wound care s/p I&D on 02/20/12.   Doing well No issues or complaints Afebrile/ no chills No nausea or vomiting Tolerating antibiotics.   Pain improved, but still complains of discomfort with standing or bending at the waist.   Daily dressing change Previous note reviewed  Past Medical History  Diagnosis Date  . Diabetes mellitus      Home Meds: Prior to Admission medications   Medication Sig Start Date End Date Taking? Authorizing Provider  amoxicillin (AMOXIL) 500 MG capsule Take 1 capsule (500 mg total) by mouth 3 (three) times daily. 02/19/12   Ellison Carwin, MD  HYDROcodone-acetaminophen (NORCO) 5-325 MG per tablet Take 1 tablet by mouth every 4 (four) hours as needed for pain. 02/18/12   Ellison Carwin, MD  ibuprofen (ADVIL,MOTRIN) 600 MG tablet Take 600 mg by mouth every 8 (eight) hours as needed.    Historical Provider, MD  insulin glargine (LANTUS) 100 UNIT/ML injection Inject into the skin at bedtime.    Historical Provider, MD  insulin lispro (HUMALOG) 100 UNIT/ML injection Inject into the skin 3 (three) times daily before meals. Sliding scale    Historical Provider, MD  sulfamethoxazole-trimethoprim (BACTRIM DS,SEPTRA DS) 800-160 MG per tablet Take 1 tablet by mouth 2 (two) times daily. 02/18/12   Ellison Carwin, MD    Allergies: No Known Allergies  ROS: Constitutional: Afebrile, no chills Cardiovascular: negative for chest pain or palpitations Dermatological: Positive for wound. Negative for erythema or warmth. Positive for pain.   GI: No nausea or vomiting   EXAM: Physical Exam: Blood pressure 116/76, pulse 97, temperature 98.4 F (36.9 C), resp. rate 16, height 5\' 5"  (1.651 m), weight 153 lb (69.4 kg), last menstrual period 02/23/2012., Body mass index is 25.46  kg/(m^2). General: Well developed, well nourished, in no acute distress. Nontoxic appearing. Head: Normocephalic, atraumatic, sclera non-icteric.  Neck: Supple. Lungs: Breathing is unlabored. Heart: Normal rate. Skin:  Warm and moist. Dressing and packing in place. Minimal induration.  No erythema, , or tenderness to palpation. Neuro: Alert and oriented X 3. Moves all extremities spontaneously. Normal gait.  Psych:  Responds to questions appropriately with a normal affect.       PROCEDURE: Dressing and packing removed. No purulence expressed Wound bed healthy Irrigated with 1% plain lidocaine 5 cc. Repacked with small amount of 1/4 plain packing.  Dressing applied  LAB: Culture: Group B strep  A/P: 27 y.o. y/o female with trunk cellulitis/abscess as above s/p I&D on 02/20/12.   Wound care per above Continue amoxicillin.   Pain well controlled Daily dressing changes Continue warm compresses  Recheck 48 hours  Signed, Georgiann Mccoy, PA-C 02/26/2012 7:52 PM

## 2012-02-29 ENCOUNTER — Ambulatory Visit (INDEPENDENT_AMBULATORY_CARE_PROVIDER_SITE_OTHER): Payer: 59 | Admitting: Physician Assistant

## 2012-02-29 VITALS — BP 120/79 | HR 98 | Temp 97.8°F | Resp 16 | Ht 65.0 in | Wt 153.0 lb

## 2012-02-29 DIAGNOSIS — L02219 Cutaneous abscess of trunk, unspecified: Secondary | ICD-10-CM

## 2012-02-29 DIAGNOSIS — L03319 Cellulitis of trunk, unspecified: Secondary | ICD-10-CM

## 2012-02-29 DIAGNOSIS — R102 Pelvic and perineal pain: Secondary | ICD-10-CM

## 2012-02-29 LAB — POCT URINE PREGNANCY: Preg Test, Ur: NEGATIVE

## 2012-02-29 LAB — POCT CBC
HCT, POC: 36 % — AB (ref 37.7–47.9)
Hemoglobin: 11.5 g/dL — AB (ref 12.2–16.2)
MCH, POC: 28.6 pg (ref 27–31.2)
MCV: 89.5 fL (ref 80–97)
RBC: 4.02 M/uL — AB (ref 4.04–5.48)
WBC: 8.2 10*3/uL (ref 4.6–10.2)

## 2012-02-29 NOTE — Progress Notes (Signed)
Patient ID: Christine Reed MRN: [id number], DOB: 09-26-1985 26 y.o. Date of Encounter: 02/29/2012, 3:33 PM  Primary Physician: Reginia Forts, MD  Chief Complaint: Wound care   See previous note  HPI: 27 y.o. y/o female presents for wound care s/p I&D on 02/20/12 Doing well No issues or complaints Afebrile/ no chills No nausea or vomiting Tolerating Amoxicillin Has finished Bactrim Pain continues with standing and when bending at the waist She does state this is slowly improving Daily dressing change Previous notes reviewed  Past Medical History  Diagnosis Date  . Diabetes mellitus      Home Meds: Prior to Admission medications   Medication Sig Start Date End Date Taking? Authorizing Provider  amoxicillin (AMOXIL) 500 MG capsule Take 1 capsule (500 mg total) by mouth 3 (three) times daily. 02/19/12  Yes Ellison Carwin, MD  HYDROcodone-acetaminophen (NORCO) 5-325 MG per tablet Take 1 tablet by mouth every 4 (four) hours as needed for pain. 02/18/12  Yes Ellison Carwin, MD  ibuprofen (ADVIL,MOTRIN) 600 MG tablet Take 600 mg by mouth every 8 (eight) hours as needed.   Yes Historical Provider, MD  insulin lispro (HUMALOG) 100 UNIT/ML injection Inject into the skin 3 (three) times daily before meals. Sliding scale   Yes Historical Provider, MD  sulfamethoxazole-trimethoprim (BACTRIM DS,SEPTRA DS) 800-160 MG per tablet Take 1 tablet by mouth 2 (two) times daily. 02/18/12  Yes Ellison Carwin, MD  insulin glargine (LANTUS) 100 UNIT/ML injection Inject into the skin at bedtime.    Historical Provider, MD    Allergies: No Known Allergies  ROS: Constitutional: Afebrile, no chills Cardiovascular: negative for chest pain or palpitations Dermatological: Positive for wound and pain. Negative for erythema, or warmth GI: No nausea or vomiting   EXAM: Physical Exam: Blood pressure 120/79, pulse 98, temperature 97.8 F (36.6 C), temperature source Oral, resp. rate 16, height 5\' 5"  (1.651  m), weight 153 lb (69.4 kg), last menstrual period 02/23/2012, SpO2 100.00%., Body mass index is 25.46 kg/(m^2). General: Well developed, well nourished, in no acute distress. Nontoxic appearing. Head: Normocephalic, atraumatic, sclera non-icteric.  Neck: Supple. Lungs: Breathing is unlabored. Heart: Normal rate. Skin:  Warm and moist. Dressing and packing in place. No induration or erythema. There is considerable tenderness to palpation superior to the incision site. There is no erythema, induration, fluctuance, swelling, or lesion along this location.  Neuro: Alert and oriented X 3. Moves all extremities spontaneously. Normal gait.  Psych:  Responds to questions appropriately with a normal affect.   PROCEDURE: Dressing and packing removed. No purulence expressed Wound bed healthy Wound bed has healed up to the skin surface Irrigated with 1% plain lidocaine 5 cc. Wound probed not revealing any tracks superiorly that would lead to a loculated lesion. No further packing required  LAB: Results for orders placed in visit on 02/29/12  POCT URINE PREGNANCY      Component Value Range   Preg Test, Ur Negative    POCT CBC      Component Value Range   WBC 8.2  4.6 - 10.2 K/uL   Lymph, poc 2.4  0.6 - 3.4   POC LYMPH PERCENT 29.6  10 - 50 %L   MID (cbc) 0.4  0 - 0.9   POC MID % 4.7  0 - 12 %M   POC Granulocyte 5.4  2 - 6.9   Granulocyte percent 65.7  37 - 80 %G   RBC 4.02 (*) 4.04 - 5.48 M/uL   Hemoglobin 11.5 (*) 12.2 -  16.2 g/dL   HCT, POC 36.0 (*) 37.7 - 47.9 %   MCV 89.5  80 - 97 fL   MCH, POC 28.6  27 - 31.2 pg   MCHC 31.9  31.8 - 35.4 g/dL   RDW, POC 13.6     Platelet Count, POC 455 (*) 142 - 424 K/uL   MPV 8.8  0 - 99.8 fL     Culture:   A/P: 27 y.o. y/o female with cellulitis/abscess as above s/p I&D on 02/20/12 -Christine Reed was in room with me for entire office visit, exam, and procedure -Wound care per above -Patient to have a pelvic ultrasound on 03/02/12 to rule out  loculation and evaluate etiology of her pain, as it is unclear at this time.  -Continue Amoxicillin -Given 4 Bactrim tabs -Incision site and surrounding tissue is unremarkable -RTC/ER precautions -Discussed with Dr. Linna Darner  Signed, Christell Faith, PA-C 02/29/2012 3:33 PM

## 2012-03-02 ENCOUNTER — Ambulatory Visit
Admission: RE | Admit: 2012-03-02 | Discharge: 2012-03-02 | Disposition: A | Discharge status: Home / Self Care | Payer: 59 | Source: Ambulatory Visit | Attending: Physician Assistant | Admitting: Physician Assistant

## 2012-03-02 DIAGNOSIS — R102 Pelvic and perineal pain: Secondary | ICD-10-CM

## 2012-03-02 NOTE — Addendum Note (Signed)
Addended by: Kem Boroughs D on: 03/02/2012 09:45 AM   Modules accepted: Orders

## 2012-03-03 ENCOUNTER — Telehealth: Payer: Self-pay

## 2012-03-03 NOTE — Telephone Encounter (Signed)
Gave pt results of her [place] tests and plan to continue warm compresses and RTC for recheck tomorrow. Pt reports no fevers and agreed to RTC tomorrow.

## 2012-03-03 NOTE — Telephone Encounter (Signed)
Please call patient with MRI results   [phone]

## 2012-03-04 ENCOUNTER — Ambulatory Visit (INDEPENDENT_AMBULATORY_CARE_PROVIDER_SITE_OTHER): Payer: 59 | Admitting: Physician Assistant

## 2012-03-04 VITALS — BP 112/80 | HR 86 | Temp 98.8°F | Resp 16 | Ht 65.0 in | Wt 153.0 lb

## 2012-03-04 DIAGNOSIS — R599 Enlarged lymph nodes, unspecified: Secondary | ICD-10-CM

## 2012-03-04 DIAGNOSIS — R1030 Lower abdominal pain, unspecified: Secondary | ICD-10-CM

## 2012-03-04 DIAGNOSIS — IMO0001 Reserved for inherently not codable concepts without codable children: Secondary | ICD-10-CM

## 2012-03-04 DIAGNOSIS — L02214 Cutaneous abscess of groin: Secondary | ICD-10-CM

## 2012-03-04 DIAGNOSIS — IMO0002 Reserved for concepts with insufficient information to code with codable children: Secondary | ICD-10-CM

## 2012-03-04 DIAGNOSIS — R109 Unspecified abdominal pain: Secondary | ICD-10-CM

## 2012-03-04 DIAGNOSIS — E1065 Type 1 diabetes mellitus with hyperglycemia: Secondary | ICD-10-CM

## 2012-03-04 DIAGNOSIS — L03319 Cellulitis of trunk, unspecified: Secondary | ICD-10-CM

## 2012-03-04 DIAGNOSIS — L02219 Cutaneous abscess of trunk, unspecified: Secondary | ICD-10-CM

## 2012-03-04 NOTE — Progress Notes (Addendum)
Patient ID: Christine Reed MRN: [id number], DOB: 1985-10-16 27 y.o. Date of Encounter: 03/04/2012, 7:39 PM  Primary Physician: Reginia Forts, MD  Chief Complaint: Wound care   See previous note  HPI: 27 y.o. y/o female presents for wound care s/p I&D on 02/20/12 Doing well Afebrile/ no chills No nausea or vomiting Pain improving Had pelvic and transvaginal ultrasound on 03/02/12 that revealed a questionable residual collapsed cyst vs hematoma, less likely abscess.  Has been using heating pad and heated wash cloth over area for warm compresses.  Taking ibuprofen once a day, usually in the morning, "After that, I don't need it."  Blood sugars are improving, but still uncontrolled. Fasting sugars have leveled off around 200-250. Regimen currently includes SSI and Lantus 25 units QHS. She has an appointment with endocrinology first part of February.   Past Medical History  Diagnosis Date  . Diabetes mellitus      Home Meds: Prior to Admission medications   Medication Sig Start Date End Date Taking? Authorizing Provider  ibuprofen (ADVIL,MOTRIN) 600 MG tablet Take 600 mg by mouth every 8 (eight) hours as needed.   Yes Historical Provider, MD  insulin glargine (LANTUS) 100 UNIT/ML injection Inject into the skin at bedtime.   Yes Historical Provider, MD  insulin lispro (HUMALOG) 100 UNIT/ML injection Inject into the skin 3 (three) times daily before meals. Sliding scale   Yes Historical Provider, MD  sulfamethoxazole-trimethoprim (BACTRIM DS,SEPTRA DS) 800-160 MG per tablet Take 1 tablet by mouth 2 (two) times daily. 02/18/12  Yes Ellison Carwin, MD  amoxicillin (AMOXIL) 500 MG capsule Take 1 capsule (500 mg total) by mouth 3 (three) times daily. 02/19/12   Ellison Carwin, MD  HYDROcodone-acetaminophen (NORCO) 5-325 MG per tablet Take 1 tablet by mouth every 4 (four) hours as needed for pain. 02/18/12   Ellison Carwin, MD    Allergies: No Known Allergies  ROS: Constitutional: Afebrile,  no chills Cardiovascular: negative for chest pain or palpitations Dermatological: positive for pain. negative for wound, erythema, or warmth  GI: No nausea or vomiting   EXAM: Physical Exam: Blood pressure 112/80, pulse 86, temperature 98.8 F (37.1 C), temperature source Oral, resp. rate 16, height 5\' 5"  (1.651 m), weight 153 lb (69.4 kg), last menstrual period 02/23/2012., Body mass index is 25.46 kg/(m^2). General: Well developed, well nourished, in no acute distress. Nontoxic appearing. Head: Normocephalic, atraumatic, sclera non-icteric.  Neck: Supple. Lungs: Breathing is unlabored. Heart: Normal rate. Skin:  Warm and moist. No induration or erythema. Tenderness to palpation remains superior to original incision site. This is slightly improved from last visit. No signs infection.  Neuro: Alert and oriented X 3. Moves all extremities spontaneously. Normal gait.  Psych:  Responds to questions appropriately with a normal affect.   Angie McFarland in room for exam  LAB: Culture: GBS  A/P: 27 y.o. y/o female with resolved cellulitis/abscess as above s/p I&D on 02/20/12, IDDM, and groin pain 1) Cellulitis/abscess groin -Resolved  2) Groin pain -Continue warm compresses -Ibuprofen bid prn -Recheck 03/10/12, sooner if worse -Discussed ultrasound results with patient again. Will plan to have a follow up ultrasound in one month. Order placed -Return to clinic precautions  3) IDDM -Referral has been made for patient to be seen by Endocrinology -At home fasting blood sugars have leveled off at 200-250 -Currently taking Lantus 25 units QHS -Increase Lantus by 2 units every 2 days until fasting blood sugar of 150 or seen by Endocrinology    Signed, Christell Faith,  PA-C 03/04/2012 7:39 PM    Discussed with patient during office visit that she had developed vaginal candidiasis secondary to her antibiotics. Forgot to send in Diflucan the previous night. Diflucan now sent in. Please call  patient and notify her that it has been sent in.  Christell Faith, PA-C 03/05/2012 10:17 AM

## 2012-03-05 ENCOUNTER — Other Ambulatory Visit: Payer: Self-pay | Admitting: Radiology

## 2012-03-05 DIAGNOSIS — R102 Pelvic and perineal pain: Secondary | ICD-10-CM

## 2012-03-05 MED ORDER — FLUCONAZOLE 150 MG PO TABS: 150.0000 mg | ORAL_TABLET | Freq: Once | ORAL | Status: DC

## 2012-03-05 NOTE — Addendum Note (Signed)
Addended by: Rise Mu on: 03/05/2012 10:17 AM   Modules accepted: Orders

## 2012-03-06 DIAGNOSIS — Z0271 Encounter for disability determination: Secondary | ICD-10-CM

## 2012-03-09 ENCOUNTER — Ambulatory Visit (INDEPENDENT_AMBULATORY_CARE_PROVIDER_SITE_OTHER): Payer: 59 | Admitting: Physician Assistant

## 2012-03-09 VITALS — BP 117/84 | HR 108 | Temp 98.0°F | Resp 12 | Ht 65.0 in | Wt 154.0 lb

## 2012-03-09 DIAGNOSIS — R1030 Lower abdominal pain, unspecified: Secondary | ICD-10-CM

## 2012-03-09 DIAGNOSIS — R109 Unspecified abdominal pain: Secondary | ICD-10-CM

## 2012-03-09 LAB — POCT CBC
Lymph, poc: 3.5 — AB (ref 0.6–3.4)
MCH, POC: 28.6 pg (ref 27–31.2)
MCHC: 32.2 g/dL (ref 31.8–35.4)
MCV: 89 fL (ref 80–97)
MID (cbc): 0.4 (ref 0–0.9)
POC LYMPH PERCENT: 46.9 %L (ref 10–50)
Platelet Count, POC: 389 10*3/uL (ref 142–424)
RDW, POC: 13.2 %
WBC: 7.5 10*3/uL (ref 4.6–10.2)

## 2012-03-09 MED ORDER — TRAMADOL HCL 50 MG PO TABS: 50.0000 mg | ORAL_TABLET | Freq: Three times a day (TID) | ORAL | Status: DC | PRN

## 2012-03-09 NOTE — Progress Notes (Signed)
Patient ID: Christine Reed MRN: [id number], DOB: 14-Jul-1985, 27 y.o. Date of Encounter: 03/09/2012, 8:50 PM  Primary Physician: Reginia Forts, MD  Chief Complaint: Follow up pelvic/groin pain  HPI: 27 y.o. year old female with history below presents for follow up of groin/pelvic pain. Since her last office visit on 03/04/12 her discomfort has remained "about the same." She had a pelvic ultrasound on 03/02/12 read as "11 x 5 x 10 mm subcutaneous collection in the left lateral to the mons pubis, without significant associated hypervascularity, question residual collapsed cyst versus hematoma, less likely  Abscess." However today her pain did worsen. She states it now radiates up her inguinal fold towards her left flank. She has remained afebrile and without chills. No swelling, or erythema. She has been using the heating pad and taking ibuprofen one to two times per day which helps. She has not been sexually active since any of this began in early January and had a negative urine HCG on 02/29/2012. She did have some mild spotting on 1/24 and 1/25 that resolved, otherwise no vaginal complaints. No dysuria, frequency, or urgency. "I feel fine other than this." She does have a routine gynecology office on 03/12/12.  Her fasting blood sugars have improved from 250 to 180. She has an appointment with endocrinology on 03/13/12. No polydipsia, polyuria, or nocturia. Has been increasing her Lantus by 2 units every two days. She does state that in the afternoon around lunch time her blood sugar will still spike around the 250 range.    Past Medical History  Diagnosis Date  . Diabetes mellitus      Home Meds: Prior to Admission medications   Medication Sig Start Date End Date Taking? Authorizing Provider         ibuprofen (ADVIL,MOTRIN) 600 MG tablet Take 600 mg by mouth every 8 (eight) hours as needed.   Yes Historical Provider, MD  insulin glargine (LANTUS) 100 UNIT/ML injection Inject into the skin at  bedtime.   Yes Historical Provider, MD  insulin lispro (HUMALOG) 100 UNIT/ML injection Inject into the skin 3 (three) times daily before meals. Sliding scale   Yes Historical Provider, MD                         Allergies: No Known Allergies  History   Social History  . Marital Status: Single    Spouse Name: N/A    Number of Children: N/A  . Years of Education: N/A   Occupational History  . Not on file.   Social History Main Topics  . Smoking status: Never Smoker   . Smokeless tobacco: Not on file  . Alcohol Use: No  . Drug Use: No  . Sexually Active:    Other Topics Concern  . Not on file   Social History Narrative  . No narrative on file     Review of Systems: Constitutional: negative for chills, fever, night sweats, weight changes, or fatigue  Cardiovascular: negative for chest pain or palpitations Respiratory: negative for hemoptysis, wheezing, shortness of breath, or cough Abdominal: positive for abdominal pain. negative for nausea, vomiting, or diarrhea Genitourinary: negative for dysuria, urinary frequency, urinary urgency, or nocturia Vaginal: positive for pelvic pain and spotting. Negative for dyspareunia, or discharge    Dermatological: negative for rash Neurologic: negative for headache, dizziness, or syncope All other systems reviewed and are otherwise negative with the exception to those above and in the HPI.   Physical Exam: Blood  pressure 117/84, pulse 108, temperature 98 F (36.7 C), temperature source Oral, resp. rate 12, height 5\' 5"  (1.651 m), weight 154 lb (69.854 kg), last menstrual period 02/23/2012, SpO2 99.00%., Body mass index is 25.63 kg/(m^2). General: Well developed, well nourished, in no acute distress. Well appearing.  Head: Normocephalic, atraumatic, eyes without discharge, sclera non-icteric, nares are without discharge.   Neck: Supple. Full ROM.  Lungs: Breathing is unlabored. Heart: Regular rate. Abdomen: Soft, non-distended. TTP  LLQ extending into left inguinal region and left mons pubis. No hepatosplenomegaly. No rebound/guarding. No obvious abdominal masses. Negative table jar. No erythema, induration, fluctuance, lesion, or wound along the mons pubis. Original incision site resolved.   Msk:  Strength and tone normal for age. Extremities/Skin: Warm and dry. No clubbing or cyanosis. No edema. No rashes or suspicious lesions. Neuro: Alert and oriented X 3. Moves all extremities spontaneously. Gait is normal. CNII-XII grossly in tact. Psych:  Responds to questions appropriately with a normal affect.   Timoteo Expose in room for exam.  Labs: Results for orders placed in visit on 03/09/12  POCT CBC      Component Value Range   WBC 7.5  4.6 - 10.2 K/uL   Lymph, poc 3.5 (*) 0.6 - 3.4   POC LYMPH PERCENT 46.9  10 - 50 %L   MID (cbc) 0.4  0 - 0.9   POC MID % 5.6  0 - 12 %M   POC Granulocyte 3.6  2 - 6.9   Granulocyte percent 47.5  37 - 80 %G   RBC 4.05  4.04 - 5.48 M/uL   Hemoglobin 11.6 (*) 12.2 - 16.2 g/dL   HCT, POC 36.0 (*) 37.7 - 47.9 %   MCV 89.0  80 - 97 fL   MCH, POC 28.6  27 - 31.2 pg   MCHC 32.2  31.8 - 35.4 g/dL   RDW, POC 13.2     Platelet Count, POC 389  142 - 424 K/uL   MPV 8.1  0 - 99.8 fL   Culture: GBS  ASSESSMENT AND PLAN:  27 y.o. year old female with resolved cellulitis/abscess as above s/p aspiration on 02/18/12 and  I&D on 02/20/12, groin pain, and IDDM.  1) Cellulitis/abscess s/p I&D 02/20/12 -Resolved  2) Groin pain -CT abdomen and pelvis 03/10/12 with call report -Ultram 50 mg 1 po tid prn pain #30 no RF -Warm compresses -Further evaluation and treatment pending CT -Discussed with patient there are no outward signs of infection or abscess that would indicate need for I&D at this time. She agrees with this plan.  -Discussed with Dr. Linna Darner  3) IDDM -Has appointment with endocrinology 03/13/12 -Fasting blood sugars have improved to 180 -Continue titration of Lantus, further treatment  deferred to endocrinology     Signed, Christell Faith, PA-C 03/09/2012 8:50 PM

## 2012-03-11 ENCOUNTER — Ambulatory Visit
Admission: RE | Admit: 2012-03-11 | Discharge: 2012-03-11 | Disposition: A | Discharge status: Home / Self Care | Payer: 59 | Source: Ambulatory Visit | Attending: Physician Assistant | Admitting: Physician Assistant

## 2012-03-11 ENCOUNTER — Telehealth: Payer: Self-pay | Admitting: Radiology

## 2012-03-11 DIAGNOSIS — R109 Unspecified abdominal pain: Secondary | ICD-10-CM

## 2012-03-11 DIAGNOSIS — R1030 Lower abdominal pain, unspecified: Secondary | ICD-10-CM

## 2012-03-11 MED ORDER — IOHEXOL 300 MG/ML  SOLN
100.0000 mL | Freq: Once | INTRAMUSCULAR | Status: AC | PRN
  Administered 2012-03-11: 100 mL via INTRAVENOUS

## 2012-03-11 NOTE — Telephone Encounter (Signed)
[facility] called, patients CT report is in system. I spoke to Christell Faith, [place] and he will call patient with CT report today. Please advise if anything further is needed with this patient. Thank you

## 2012-03-11 NOTE — Telephone Encounter (Signed)
Please see detailed note attached to CT report. I have already spoken to patient and will close this encounter.

## 2012-03-16 ENCOUNTER — Telehealth: Payer: Self-pay | Admitting: Physician Assistant

## 2012-03-16 NOTE — Telephone Encounter (Signed)
Spoke with patient. She is doing better. Discomfort continues to improve. She did see her gynecologist last week, but did not talk about the above. She will continue with current treatment and will call in one week with an update. She is released to go back to work. Please fax return to work letter to [phone].  Christell Faith, PA-C 03/16/2012 9:50 AM

## 2012-03-16 NOTE — Telephone Encounter (Signed)
Faxed

## 2012-03-20 ENCOUNTER — Telehealth: Payer: Self-pay

## 2012-03-20 NOTE — Telephone Encounter (Signed)
Please advise, you have released patient to return to work, however she does not feel she can return at this time.

## 2012-03-20 NOTE — Telephone Encounter (Signed)
Patient states she is not asking for an extension out of work, she wants all her records sent to her employer, she states they did not get all her records. Christine Reed

## 2012-03-20 NOTE — Telephone Encounter (Signed)
Patient had stated on the phone with me that she was feeling better and that she was ready to go back to work. Unfortunately, I do not have objective data to take her out of work at this time. If she feels that she cannot go back to work she will have to come back in and be reevaluated.

## 2012-03-20 NOTE — Telephone Encounter (Signed)
I will not send her records to her employer. She may request a printed copy of her records for pick up through medical records.

## 2012-03-20 NOTE — Telephone Encounter (Signed)
FOR RYAN PT WOULD LIKE TO SPEAK WITH YOU REGARDING HER DISABILITY. YOU HAD HER GOING BACK TO WORK ON Monday, BUT SHE IS QUITE SURE SHE ISN'T READY I DO HAVE PAPERWORK TO BE FILLED OUT ON HER IF YOU NEED PLEASE CALL [phone]

## 2012-03-24 NOTE — Telephone Encounter (Signed)
Spoke with patient. States this is a disability request and she has already spoken with Rollen Sox and was told her records were already ready for pickup. She just hasn't had a chance to come by and get them yet.

## 2012-04-13 ENCOUNTER — Telehealth: Payer: Self-pay | Admitting: Physician Assistant

## 2012-04-13 NOTE — Telephone Encounter (Signed)
Please call the patient. She has submitted a form requesting an accomodation to assist in performing her essential job functions. She has been cleared return to full duty. Based on that I cannot fill this form out. If she is still having issues she needs to let us know so we can refer her to the appropriate specialist.

## 2012-04-13 NOTE — Telephone Encounter (Signed)
Left message for her to call me back to advise.

## 2012-04-15 NOTE — Telephone Encounter (Signed)
Called [employer] again left message to advise can not fill out the form for her based on the exam performed, she should be cleared to return to full duty.

## 2012-05-12 ENCOUNTER — Telehealth: Payer: Self-pay

## 2012-05-12 NOTE — Telephone Encounter (Signed)
FOR RYAN: PT KNEW WE HAD DONE SOME FMLA PAPERS FOR HER AND IT WAS APPROVED BY HER JOB. HOWEVER SHE WAS ALSO OOW FEB 4TH,5TH,6 AND 7TH. WASN'T FEELING GOOD AND NEED THAT TIME OR ELSE SHE WILL LOSE HER JOB. PLEASE CALL [phone] AND LEAVE MESSAGE IF SHE DOESN'T ANSWER

## 2012-05-12 NOTE — Telephone Encounter (Signed)
Patient advised she was released to return prior to these dates. To do this now is considered fraudulent. Left message per her request. This WILL NOT be done for her. Discussed with Christell Faith.

## 2012-05-14 ENCOUNTER — Telehealth: Payer: Self-pay | Admitting: Radiology

## 2012-05-14 NOTE — Telephone Encounter (Signed)
Patient called back today upset she can not get work notes from Feb. 3-10th. She wants this, I advised her again we can not provide her work notes for these dates. Shealynn Saulnier

## 2012-05-14 NOTE — Telephone Encounter (Signed)
Reviewed all records pertaining to this issue. Appropriate steps have been made. Patient was advised to RTW on 03/16/2012 per Christell Faith, PA-C. Recorded are multiple conversations over the last couple of months where the patient has been advised we would be unable to write the note she is requesting.

## 2012-05-14 NOTE — Telephone Encounter (Signed)
Please see previous messages, patient upset because Christine Reed will not give her out of work from Christine Reed. Have advised patient she was cleared to return to work on Christine 3rd and this can not be extended. She became very angry on the phone, states she will talk to her lawyer, and she was raising her voice and cursing. To you FYI

## 2012-05-24 ENCOUNTER — Ambulatory Visit: Payer: 59

## 2012-06-16 ENCOUNTER — Telehealth: Payer: Self-pay

## 2013-02-11 HISTORY — PX: OTHER SURGICAL HISTORY: SHX169

## 2013-10-26 NOTE — Telephone Encounter (Signed)
ERROR

## 2015-04-11 LAB — OB RESULTS CONSOLE GC/CHLAMYDIA
Chlamydia: NEGATIVE
GC PROBE AMP, GENITAL: NEGATIVE

## 2015-04-11 LAB — CYTOLOGY - PAP
PAP SMEAR: NEGATIVE
Pap: NEGATIVE

## 2015-06-14 LAB — OB RESULTS CONSOLE HIV ANTIBODY (ROUTINE TESTING): HIV: NONREACTIVE

## 2015-06-14 LAB — OB RESULTS CONSOLE ABO/RH: RH TYPE: POSITIVE

## 2015-06-14 LAB — OB RESULTS CONSOLE HEPATITIS B SURFACE ANTIGEN: HEP B S AG: NEGATIVE

## 2015-06-14 LAB — OB RESULTS CONSOLE ANTIBODY SCREEN: ANTIBODY SCREEN: NEGATIVE

## 2015-06-14 LAB — OB RESULTS CONSOLE RPR: RPR: NONREACTIVE

## 2015-06-14 LAB — OB RESULTS CONSOLE RUBELLA ANTIBODY, IGM: RUBELLA: IMMUNE

## 2015-06-15 ENCOUNTER — Ambulatory Visit (HOSPITAL_COMMUNITY): Payer: Self-pay

## 2015-06-15 ENCOUNTER — Other Ambulatory Visit (HOSPITAL_COMMUNITY): Payer: Self-pay | Admitting: Obstetrics and Gynecology

## 2015-06-15 DIAGNOSIS — Z3A13 13 weeks gestation of pregnancy: Secondary | ICD-10-CM

## 2015-06-15 DIAGNOSIS — O24911 Unspecified diabetes mellitus in pregnancy, first trimester: Secondary | ICD-10-CM

## 2015-06-15 DIAGNOSIS — N19 Unspecified kidney failure: Secondary | ICD-10-CM

## 2015-06-15 DIAGNOSIS — Z3682 Encounter for antenatal screening for nuchal translucency: Secondary | ICD-10-CM

## 2015-06-16 ENCOUNTER — Encounter: Payer: Self-pay | Admitting: *Deleted

## 2015-06-16 DIAGNOSIS — O0990 Supervision of high risk pregnancy, unspecified, unspecified trimester: Secondary | ICD-10-CM

## 2015-06-16 DIAGNOSIS — O24919 Unspecified diabetes mellitus in pregnancy, unspecified trimester: Secondary | ICD-10-CM | POA: Insufficient documentation

## 2015-06-16 DIAGNOSIS — O24019 Pre-existing diabetes mellitus, type 1, in pregnancy, unspecified trimester: Secondary | ICD-10-CM

## 2015-06-21 ENCOUNTER — Encounter (HOSPITAL_COMMUNITY): Payer: Self-pay | Admitting: Obstetrics and Gynecology

## 2015-06-24 ENCOUNTER — Encounter (HOSPITAL_COMMUNITY): Payer: Self-pay | Admitting: *Deleted

## 2015-06-24 ENCOUNTER — Inpatient Hospital Stay (HOSPITAL_COMMUNITY)
Admission: AD | Admit: 2015-06-24 | Discharge: 2015-06-28 | DRG: 781 | Disposition: A | Discharge status: Home / Self Care | Payer: BLUE CROSS/BLUE SHIELD | Source: Ambulatory Visit | Attending: Obstetrics and Gynecology | Admitting: Obstetrics and Gynecology

## 2015-06-24 DIAGNOSIS — O99011 Anemia complicating pregnancy, first trimester: Secondary | ICD-10-CM | POA: Diagnosis present

## 2015-06-24 DIAGNOSIS — O24019 Pre-existing diabetes mellitus, type 1, in pregnancy, unspecified trimester: Secondary | ICD-10-CM

## 2015-06-24 DIAGNOSIS — O24011 Pre-existing diabetes mellitus, type 1, in pregnancy, first trimester: Secondary | ICD-10-CM | POA: Diagnosis present

## 2015-06-24 DIAGNOSIS — E1165 Type 2 diabetes mellitus with hyperglycemia: Secondary | ICD-10-CM | POA: Diagnosis present

## 2015-06-24 DIAGNOSIS — O10211 Pre-existing hypertensive chronic kidney disease complicating pregnancy, first trimester: Secondary | ICD-10-CM | POA: Diagnosis present

## 2015-06-24 DIAGNOSIS — D573 Sickle-cell trait: Secondary | ICD-10-CM | POA: Diagnosis present

## 2015-06-24 DIAGNOSIS — Z833 Family history of diabetes mellitus: Secondary | ICD-10-CM | POA: Diagnosis not present

## 2015-06-24 DIAGNOSIS — E1022 Type 1 diabetes mellitus with diabetic chronic kidney disease: Secondary | ICD-10-CM | POA: Diagnosis present

## 2015-06-24 DIAGNOSIS — O2301 Infections of kidney in pregnancy, first trimester: Secondary | ICD-10-CM | POA: Diagnosis present

## 2015-06-24 DIAGNOSIS — O1002 Pre-existing essential hypertension complicating childbirth: Secondary | ICD-10-CM | POA: Diagnosis present

## 2015-06-24 DIAGNOSIS — Z794 Long term (current) use of insulin: Secondary | ICD-10-CM

## 2015-06-24 DIAGNOSIS — I129 Hypertensive chronic kidney disease with stage 1 through stage 4 chronic kidney disease, or unspecified chronic kidney disease: Secondary | ICD-10-CM | POA: Diagnosis present

## 2015-06-24 DIAGNOSIS — O0990 Supervision of high risk pregnancy, unspecified, unspecified trimester: Secondary | ICD-10-CM

## 2015-06-24 DIAGNOSIS — Z3A11 11 weeks gestation of pregnancy: Secondary | ICD-10-CM | POA: Diagnosis not present

## 2015-06-24 DIAGNOSIS — N189 Chronic kidney disease, unspecified: Secondary | ICD-10-CM | POA: Diagnosis present

## 2015-06-24 DIAGNOSIS — IMO0002 Reserved for concepts with insufficient information to code with codable children: Secondary | ICD-10-CM | POA: Diagnosis present

## 2015-06-24 LAB — URINALYSIS, ROUTINE W REFLEX MICROSCOPIC
Bilirubin Urine: NEGATIVE
Glucose, UA: >1000 mg/dL — AB
Ketones, ur: NEGATIVE mg/dL
LEUKOCYTES UA: NEGATIVE
Nitrite: NEGATIVE
SPECIFIC GRAVITY, URINE: 1.02 (ref 1.005–1.030)
pH: 6.5 (ref 5.0–8.0)

## 2015-06-24 LAB — FERRITIN: Ferritin: 61 ng/mL (ref 11–307)

## 2015-06-24 LAB — GLUCOSE, CAPILLARY
GLUCOSE-CAPILLARY: 197 mg/dL — AB (ref 65–99)
GLUCOSE-CAPILLARY: 104 mg/dL — AB (ref 65–99)
Glucose-Capillary: 130 mg/dL — ABNORMAL HIGH (ref 65–99)
Glucose-Capillary: 54 mg/dL — ABNORMAL LOW (ref 65–99)
Glucose-Capillary: 183 mg/dL — ABNORMAL HIGH (ref 65–99)

## 2015-06-24 LAB — COMPREHENSIVE METABOLIC PANEL
ALT: 16 U/L (ref 14–54)
AST: 18 U/L (ref 15–41)
Albumin: 1.9 g/dL — ABNORMAL LOW (ref 3.5–5.0)
Alkaline Phosphatase: 48 U/L (ref 38–126)
Anion gap: 5 (ref 5–15)
BUN: 17 mg/dL (ref 6–20)
CHLORIDE: 109 mmol/L (ref 101–111)
CO2: 22 mmol/L (ref 22–32)
CREATININE: 1.51 mg/dL — AB (ref 0.44–1.00)
Calcium: 8.4 mg/dL — ABNORMAL LOW (ref 8.9–10.3)
GFR calc Af Amer: 54 mL/min — ABNORMAL LOW (ref >60)
GFR calc non Af Amer: 46 mL/min — ABNORMAL LOW (ref >60)
Glucose, Bld: 221 mg/dL — ABNORMAL HIGH (ref 65–99)
Potassium: 4.2 mmol/L (ref 3.5–5.1)
Sodium: 136 mmol/L (ref 135–145)
Total Bilirubin: 0.5 mg/dL (ref 0.3–1.2)
Total Protein: 5.5 g/dL — ABNORMAL LOW (ref 6.5–8.1)

## 2015-06-24 LAB — CBC WITH DIFFERENTIAL/PLATELET
BASOS PCT: 0 %
Basophils Absolute: 0 10*3/uL (ref 0.0–0.1)
EOS ABS: 0.1 10*3/uL (ref 0.0–0.7)
Eosinophils Relative: 1 %
HCT: 19.9 % — ABNORMAL LOW (ref 36.0–46.0)
HEMOGLOBIN: 7.1 g/dL — AB (ref 12.0–15.0)
LYMPHS ABS: 1.9 10*3/uL (ref 0.7–4.0)
Lymphocytes Relative: 20 %
MCH: 28.9 pg (ref 26.0–34.0)
MCHC: 35.7 g/dL (ref 30.0–36.0)
MCV: 80.9 fL (ref 78.0–100.0)
Monocytes Absolute: 0.2 10*3/uL (ref 0.1–1.0)
Monocytes Relative: 2 %
NEUTROS PCT: 77 %
Neutro Abs: 7.1 10*3/uL (ref 1.7–7.7)
Platelets: 275 10*3/uL (ref 150–400)
RBC: 2.46 MIL/uL — AB (ref 3.87–5.11)
RDW: 13.8 % (ref 11.5–15.5)
WBC: 9.4 10*3/uL (ref 4.0–10.5)

## 2015-06-24 LAB — TYPE AND SCREEN
ABO/RH(D): O POS
ANTIBODY SCREEN: NEGATIVE

## 2015-06-24 LAB — URINE MICROSCOPIC-ADD ON

## 2015-06-24 LAB — WET PREP, GENITAL
Sperm: NONE SEEN
TRICH WET PREP: NONE SEEN
YEAST WET PREP: NONE SEEN

## 2015-06-24 LAB — ABO/RH: ABO/RH(D): O POS

## 2015-06-24 MED ORDER — INSULIN ASPART 100 UNIT/ML ~~LOC~~ SOLN: 0.0000 [IU] | Freq: Every day | SUBCUTANEOUS | Status: DC

## 2015-06-24 MED ORDER — DOCUSATE SODIUM 100 MG PO CAPS
100.0000 mg | ORAL_CAPSULE | Freq: Every day | ORAL | Status: DC
  Administered 2015-06-25 – 2015-06-28 (×4): 100 mg via ORAL
  Filled 2015-06-24 (×6): qty 1

## 2015-06-24 MED ORDER — ZOLPIDEM TARTRATE 5 MG PO TABS: 5.0000 mg | ORAL_TABLET | Freq: Every evening | ORAL | Status: DC | PRN

## 2015-06-24 MED ORDER — CALCIUM CARBONATE ANTACID 500 MG PO CHEW
2.0000 | CHEWABLE_TABLET | ORAL | Status: DC | PRN
  Administered 2015-06-25: 400 mg via ORAL
  Filled 2015-06-24 (×2): qty 2

## 2015-06-24 MED ORDER — INSULIN NPH (HUMAN) (ISOPHANE) 100 UNIT/ML ~~LOC~~ SUSP
12.0000 [IU] | Freq: Every morning | SUBCUTANEOUS | Status: DC
  Administered 2015-06-25 – 2015-06-26 (×2): 12 [IU] via SUBCUTANEOUS
  Filled 2015-06-24: qty 10

## 2015-06-24 MED ORDER — PRENATAL MULTIVITAMIN CH
1.0000 | ORAL_TABLET | Freq: Every day | ORAL | Status: DC
  Administered 2015-06-25 – 2015-06-28 (×4): 1 via ORAL
  Filled 2015-06-24 (×6): qty 1

## 2015-06-24 MED ORDER — CEFAZOLIN SODIUM-DEXTROSE 2-4 GM/100ML-% IV SOLN
2.0000 g | Freq: Three times a day (TID) | INTRAVENOUS | Status: AC
  Administered 2015-06-24 – 2015-06-26 (×6): 2 g via INTRAVENOUS
  Filled 2015-06-24 (×6): qty 100

## 2015-06-24 MED ORDER — INSULIN ASPART 100 UNIT/ML ~~LOC~~ SOLN
0.0000 [IU] | Freq: Three times a day (TID) | SUBCUTANEOUS | Status: DC
  Administered 2015-06-24: 2 [IU] via SUBCUTANEOUS
  Administered 2015-06-25: 3 [IU] via SUBCUTANEOUS

## 2015-06-24 MED ORDER — ACETAMINOPHEN 325 MG PO TABS
650.0000 mg | ORAL_TABLET | ORAL | Status: DC | PRN
  Administered 2015-06-25 – 2015-06-27 (×4): 650 mg via ORAL
  Filled 2015-06-24 (×4): qty 2

## 2015-06-24 NOTE — Progress Notes (Signed)
Patient states " I don't feel real well, could we check my blood sugar?"  CBG 54, hypoglycemic protocol initiated.  Repeat CBG 104 with symptoms resolving.  Patient ordering her dinner.

## 2015-06-24 NOTE — H&P (Signed)
30 y.o. yo G1P0 [redacted]w[redacted]d who presented to MAU with cramping and bleeding for two days.  Her US shows IUP at 11 weeks with +FHTs.  Pt is now classified as Renal Diabetic- pt had recent 24 hour urine protein of 8 gms and Cr of 1.5.  Pt's A1C this past week was 8.3.  She has no hx of hypertension but her BPs on her first visit to the office and here are HTN range.  She is sickle cell trait positive.   Today, her glucose is 221 and her urine has WBCs on microscopic.  She also has a H/H of 7.1/19 and a WBC of 9.4.  She is afebile but her temp is 99.3.  I will admit her for glucose control and IV antibiotics for possible evolving kidney infection, given her renal compromise.    Past Medical History  Diagnosis Date  . Diabetes mellitus     diagnosed at age 12   Past Surgical History  Procedure Laterality Date  . Tonsillectomy and adenoidectomy    . Lipo suction  2015    Social History   Social History  . Marital Status: Single    Spouse Name: N/A  . Number of Children: N/A  . Years of Education: N/A   Occupational History  . Not on file.   Social History Main Topics  . Smoking status: Never Smoker   . Smokeless tobacco: Not on file  . Alcohol Use: No  . Drug Use: No  . Sexual Activity: Not on file   Other Topics Concern  . Not on file   Social History Narrative    No current facility-administered medications on file prior to encounter.   No current outpatient prescriptions on file prior to encounter.    Allergies  Allergen Reactions  . Iodine Rash    Oral and IV  . Nickel Rash   Filed Vitals:   06/24/15 1150 06/24/15 1209  BP: 138/94 135/86  Pulse: 94   Temp: 99.3 F (37.4 C)   TempSrc: Oral   Resp: 18   Height: 5\' 5"  (1.651 m)   Weight: 73.936 kg (163 lb)     Lungs: clear to ascultation Cor:  RRR Abdomen:  soft, nontender, nondistended. Ex:  no cords, erythema Pelvic:  Per fellow, closed cervix and normal size uterus for 11 weeks.  Results for orders placed  or performed during the hospital encounter of 06/24/15 (from the past 24 hour(s))  Urinalysis, Routine w reflex microscopic (not at [facility])     Status: Abnormal   Collection Time: 06/24/15 11:30 AM  Result Value Ref Range   Color, Urine YELLOW YELLOW   APPearance CLEAR CLEAR   Specific Gravity, Urine 1.020 1.005 - 1.030   pH 6.5 5.0 - 8.0   Glucose, UA >1000 (A) NEGATIVE mg/dL   Hgb urine dipstick MODERATE (A) NEGATIVE   Bilirubin Urine NEGATIVE NEGATIVE   Ketones, ur NEGATIVE NEGATIVE mg/dL   Protein, ur >300 (A) NEGATIVE mg/dL   Nitrite NEGATIVE NEGATIVE   Leukocytes, UA NEGATIVE NEGATIVE  Urine microscopic-add on     Status: Abnormal   Collection Time: 06/24/15 11:30 AM  Result Value Ref Range   Squamous Epithelial / LPF 0-5 (A) NONE SEEN   WBC, UA 6-30 0 - 5 WBC/hpf   RBC / HPF 0-5 0 - 5 RBC/hpf   Bacteria, UA FEW (A) NONE SEEN  Wet prep, genital     Status: Abnormal   Collection Time: 06/24/15 12:10 PM  Result  Value Ref Range   Yeast Wet Prep HPF POC NONE SEEN NONE SEEN   Trich, Wet Prep NONE SEEN NONE SEEN   Clue Cells Wet Prep HPF POC PRESENT (A) NONE SEEN   WBC, Wet Prep HPF POC MODERATE (A) NONE SEEN   Sperm NONE SEEN   Glucose, capillary     Status: Abnormal   Collection Time: 06/24/15 12:12 PM  Result Value Ref Range   Glucose-Capillary 197 (H) 65 - 99 mg/dL  Comprehensive metabolic panel     Status: Abnormal   Collection Time: 06/24/15 12:21 PM  Result Value Ref Range   Sodium 136 135 - 145 mmol/L   Potassium 4.2 3.5 - 5.1 mmol/L   Chloride 109 101 - 111 mmol/L   CO2 22 22 - 32 mmol/L   Glucose, Bld 221 (H) 65 - 99 mg/dL   BUN 17 6 - 20 mg/dL   Creatinine, Ser 1.51 (H) 0.44 - 1.00 mg/dL   Calcium 8.4 (L) 8.9 - 10.3 mg/dL   Total Protein 5.5 (L) 6.5 - 8.1 g/dL   Albumin 1.9 (L) 3.5 - 5.0 g/dL   AST 18 15 - 41 U/L   ALT 16 14 - 54 U/L   Alkaline Phosphatase 48 38 - 126 U/L   Total Bilirubin 0.5 0.3 - 1.2 mg/dL   GFR calc non Af Amer 46 (L) >60 mL/min   GFR  calc Af Amer 54 (L) >60 mL/min   Anion gap 5 5 - 15  CBC with Differential/Platelet     Status: Abnormal   Collection Time: 06/24/15 12:21 PM  Result Value Ref Range   WBC 9.4 4.0 - 10.5 K/uL   RBC 2.46 (L) 3.87 - 5.11 MIL/uL   Hemoglobin 7.1 (L) 12.0 - 15.0 g/dL   HCT 19.9 (L) 36.0 - 46.0 %   MCV 80.9 78.0 - 100.0 fL   MCH 28.9 26.0 - 34.0 pg   MCHC 35.7 30.0 - 36.0 g/dL   RDW 13.8 11.5 - 15.5 %   Platelets 275 150 - 400 K/uL   Neutrophils Relative % 77 %   Neutro Abs 7.1 1.7 - 7.7 K/uL   Lymphocytes Relative 20 %   Lymphs Abs 1.9 0.7 - 4.0 K/uL   Monocytes Relative 2 %   Monocytes Absolute 0.2 0.1 - 1.0 K/uL   Eosinophils Relative 1 %   Eosinophils Absolute 0.1 0.0 - 0.7 K/uL   Basophils Relative 0 %   Basophils Absolute 0.0 0.0 - 0.1 K/uL  Type and screen     Status: None   Collection Time: 06/24/15 12:21 PM  Result Value Ref Range   ABO/RH(D) O POS    Antibody Screen NEG    Sample Expiration 06/27/2015    [place] prelim- +FHTs  A:  30 y.o. G1P0 [redacted]w[redacted]d weeks with CHTN, chronic type 1 DM, class R for renal impairment.   P: 1.  Glucose control: pt admitted for control with sliding scale.  Plan for sugars to be above 60s but below 150s.  Diabetic diet and diabetes consult. 2.  Renal impairment: will have Renal in hospital consult done on Monday. 3.  Possible pyelo: will start Ancef for 48 hours. 4.  Anemia: Pt is sickle trait positive but her anemia may be driven by her kidney impairment.  Ferritin pending and will ask Renal for further recommendations. 5.  EKG for eval of heart secondary Type 1 DM. 6.  MFM consult in hospital consult on Monday.   7.  Pt  has optho and has been treated for "nerve damage" in past but was unable to tell me if it was proliferative diabetic retinopathy.  Pt will get visit with her optho post discharge.   8.  Possible hypertension: no treatment needed if BPs remain <150/100. 9.  Watch vaginal bleeding for now.    Darey Hershberger A

## 2015-06-24 NOTE — MAU Note (Signed)
Type 1 diabetic, had 24 hr urine for kidney functions, numbers came back abnormal.  So doctor wanted me to come into hospital  on Friday.  BS have been running in the 200's today and yesterday.  Today noticed bleeding and upper stomach burning.  Normal bowel movements.  Denies burning with urination.

## 2015-06-24 NOTE — MAU Provider Note (Signed)
MAU HISTORY AND PHYSICAL  Chief Complaint:  Vaginal Bleeding and Abdominal Cramping   Christine Reed is a 30 y.o.  G1P0 with IUP at [redacted]w[redacted]d presenting for Vaginal Bleeding and Abdominal Cramping  For the past 24 hours has had vaginal bleeding. Initially like a period, now spotting. Having mild upper abdominal discomfort, no pelvic pain/crampint. No fever or chills. No dysuria or flank pain. Mild nausea, no vomiting, no diarrhea. Has an appetite.  On nph and insulin regular. Takes n just in the morning. Glucose frequently around 200 when checks.  Previously [facility] patient, had u/s there, viable iup. Has [facility] scheduled 5/25.  Past Medical History  Diagnosis Date  . Diabetes mellitus     diagnosed at age 30    Past Surgical History  Procedure Laterality Date  . Tonsillectomy and adenoidectomy    . Lipo suction  2015    Family History  Problem Relation Age of Onset  . Cancer Maternal Grandmother     colon and breast  . Diabetes Maternal Grandfather   . Diabetes Mother   . Diabetes Father   . Diabetes Paternal Grandmother   . Diabetes Paternal Grandfather     Social History  Substance Use Topics  . Smoking status: Never Smoker   . Smokeless tobacco: None  . Alcohol Use: No    Allergies  Allergen Reactions  . Iodine Rash    Oral and IV  . Nickel Rash    Prescriptions prior to admission  Medication Sig Dispense Refill Last Dose  . diphenhydrAMINE (BENADRYL) 25 MG tablet Take 25 mg by mouth every 6 (six) hours as needed for allergies.    Past Month at Unknown time  . insulin NPH Human (HUMULIN N,NOVOLIN N) 100 UNIT/ML injection Inject 12 Units into the skin every morning.   06/24/2015 at Unknown time  . insulin regular (NOVOLIN R,HUMULIN R) 100 units/mL injection Inject into the skin 3 (three) times daily before meals.   06/24/2015 at Unknown time  . Prenatal Vit-Fe Fumarate-FA (MULTIVITAMIN-PRENATAL) 27-0.8 MG TABS tablet Take 1 tablet by mouth daily at 12 noon.    06/24/2015 at Unknown time    Review of Systems - Negative except for what is mentioned in HPI.  Physical Exam  Blood pressure 135/86, pulse 94, temperature 99.3 F (37.4 C), temperature source Oral, resp. rate 18, height 5\' 5"  (1.651 m), weight 163 lb (73.936 kg), last menstrual period 04/03/2015. GENERAL: Well-developed, well-nourished female in no acute distress.  LUNGS: Clear to auscultation bilaterally.  HEART: Regular rate and rhythm. ABDOMEN: Soft, nontender, nondistended, gravid.  EXTREMITIES: Nontender, no edema, 2+ distal pulses. GU: normal external genitalia, scant blood in vagina, cervix closed, normal cervix FHT:  160s   Labs: Results for orders placed or performed during the hospital encounter of 06/24/15 (from the past 24 hour(s))  Urinalysis, Routine w reflex microscopic (not at [facility])   Collection Time: 06/24/15 11:30 AM  Result Value Ref Range   Color, Urine YELLOW YELLOW   APPearance CLEAR CLEAR   Specific Gravity, Urine 1.020 1.005 - 1.030   pH 6.5 5.0 - 8.0   Glucose, UA >1000 (A) NEGATIVE mg/dL   Hgb urine dipstick MODERATE (A) NEGATIVE   Bilirubin Urine NEGATIVE NEGATIVE   Ketones, ur NEGATIVE NEGATIVE mg/dL   Protein, ur >300 (A) NEGATIVE mg/dL   Nitrite NEGATIVE NEGATIVE   Leukocytes, UA NEGATIVE NEGATIVE  Urine microscopic-add on   Collection Time: 06/24/15 11:30 AM  Result Value Ref Range  Squamous Epithelial / LPF 0-5 (A) NONE SEEN   WBC, UA 6-30 0 - 5 WBC/hpf   RBC / HPF 0-5 0 - 5 RBC/hpf   Bacteria, UA FEW (A) NONE SEEN  Wet prep, genital   Collection Time: 06/24/15 12:10 PM  Result Value Ref Range   Yeast Wet Prep HPF POC NONE SEEN NONE SEEN   Trich, Wet Prep NONE SEEN NONE SEEN   Clue Cells Wet Prep HPF POC PRESENT (A) NONE SEEN   WBC, Wet Prep HPF POC MODERATE (A) NONE SEEN   Sperm NONE SEEN   Glucose, capillary   Collection Time: 06/24/15 12:12 PM  Result Value Ref Range   Glucose-Capillary 197 (H) 65 - 99 mg/dL  Comprehensive  metabolic panel   Collection Time: 06/24/15 12:21 PM  Result Value Ref Range   Sodium 136 135 - 145 mmol/L   Potassium 4.2 3.5 - 5.1 mmol/L   Chloride 109 101 - 111 mmol/L   CO2 22 22 - 32 mmol/L   Glucose, Bld 221 (H) 65 - 99 mg/dL   BUN 17 6 - 20 mg/dL   Creatinine, Ser 1.51 (H) 0.44 - 1.00 mg/dL   Calcium 8.4 (L) 8.9 - 10.3 mg/dL   Total Protein 5.5 (L) 6.5 - 8.1 g/dL   Albumin 1.9 (L) 3.5 - 5.0 g/dL   AST 18 15 - 41 U/L   ALT 16 14 - 54 U/L   Alkaline Phosphatase 48 38 - 126 U/L   Total Bilirubin 0.5 0.3 - 1.2 mg/dL   GFR calc non Af Amer 46 (L) >60 mL/min   GFR calc Af Amer 54 (L) >60 mL/min   Anion gap 5 5 - 15  CBC with Differential/Platelet   Collection Time: 06/24/15 12:21 PM  Result Value Ref Range   WBC 9.4 4.0 - 10.5 K/uL   RBC 2.46 (L) 3.87 - 5.11 MIL/uL   Hemoglobin 7.1 (L) 12.0 - 15.0 g/dL   HCT 19.9 (L) 36.0 - 46.0 %   MCV 80.9 78.0 - 100.0 fL   MCH 28.9 26.0 - 34.0 pg   MCHC 35.7 30.0 - 36.0 g/dL   RDW 13.8 11.5 - 15.5 %   Platelets 275 150 - 400 K/uL   Neutrophils Relative % 77 %   Neutro Abs 7.1 1.7 - 7.7 K/uL   Lymphocytes Relative 20 %   Lymphs Abs 1.9 0.7 - 4.0 K/uL   Monocytes Relative 2 %   Monocytes Absolute 0.2 0.1 - 1.0 K/uL   Eosinophils Relative 1 %   Eosinophils Absolute 0.1 0.0 - 0.7 K/uL   Basophils Relative 0 %   Basophils Absolute 0.0 0.0 - 0.1 K/uL  Type and screen   Collection Time: 06/24/15 12:21 PM  Result Value Ref Range   ABO/RH(D) O POS    Antibody Screen NEG    Sample Expiration 06/27/2015     Imaging Studies:  No results found.  Assessment: Christine Reed is  30 y.o. G1P0 at [redacted]w[redacted]d presents with Vaginal Bleeding and Abdominal Cramping On exam small amount blood in vagina, but normal FHTs, so suspect probable subchorionic hematoma. Patient is chronically ill-appearing, glucose is ~200, and shows evidence of CKD (Cr 1.5 with reported proteinuria on recent 24-hour urine) and H 7.1 (likely multifactorial, with  poorly-controlled DM and CKD contributing). Do not think DKA given normal gap and bicarb. Discussed w/ Dr. Philis Pique, with plan for admission for glucose control.   Christine Reed 5/13/20171:32 PM

## 2015-06-24 NOTE — Progress Notes (Signed)
Noted that blood sugars have been 197, 130 mg/dl. With patient being pregnant at [redacted] weeks and insulin dependent, may want to consider changing CBG monitoring to fasting and 2 hour postprandials and use the Novolog correction scale to cover. Patient's insulin requirements may change due to pregnancy. Basal NPH may need to be split dosages in the morning and at HS. Called staff RN to speak to her about postprandial blood sugars.    Another formula used with pregnant patients with diabetes: If fasting blood sugar is >90 mg/dl and 2 hr. Postprandial glucose is >120 mg/dl: Calculate patient's insulin requirement based on patient's weight (kg) and # weeks gestation. Calculate total daily dose units of insulin per 24 hours from formula:  Body weight 73 kg. TDD = 73 x 0.7 units/kg = 51.1  Per TDD of 50 units, patient would get NPH 8 units every am and at HS. Correction scale: based on TDD would start at 91-120 mg/dl and the range would be 2 units to 14-16 units CHO coverage: 1:10 kg  Will continue to monitor blood sugars while in the hospital. Harvel Ricks RN BSN CDE

## 2015-06-25 LAB — CULTURE, OB URINE

## 2015-06-25 LAB — GLUCOSE, CAPILLARY
GLUCOSE-CAPILLARY: 222 mg/dL — AB (ref 65–99)
GLUCOSE-CAPILLARY: 154 mg/dL — AB (ref 65–99)
GLUCOSE-CAPILLARY: 195 mg/dL — AB (ref 65–99)
Glucose-Capillary: 166 mg/dL — ABNORMAL HIGH (ref 65–99)
Glucose-Capillary: 180 mg/dL — ABNORMAL HIGH (ref 65–99)
Glucose-Capillary: 214 mg/dL — ABNORMAL HIGH (ref 65–99)

## 2015-06-25 MED ORDER — INSULIN ASPART 100 UNIT/ML ~~LOC~~ SOLN
0.0000 [IU] | Freq: Three times a day (TID) | SUBCUTANEOUS | Status: DC
  Administered 2015-06-26: 11 [IU] via SUBCUTANEOUS

## 2015-06-25 MED ORDER — FAMOTIDINE 20 MG PO TABS
20.0000 mg | ORAL_TABLET | Freq: Two times a day (BID) | ORAL | Status: DC
  Administered 2015-06-25 – 2015-06-28 (×7): 20 mg via ORAL
  Filled 2015-06-25 (×7): qty 1

## 2015-06-25 MED ORDER — INSULIN ASPART 100 UNIT/ML ~~LOC~~ SOLN: 0.0000 [IU] | Freq: Every day | SUBCUTANEOUS | Status: DC

## 2015-06-25 MED ORDER — INSULIN ASPART 100 UNIT/ML ~~LOC~~ SOLN
0.0000 [IU] | Freq: Three times a day (TID) | SUBCUTANEOUS | Status: DC
  Administered 2015-06-25: 2 [IU] via SUBCUTANEOUS
  Administered 2015-06-25 (×2): 4 [IU] via SUBCUTANEOUS

## 2015-06-25 MED ORDER — INSULIN ASPART 100 UNIT/ML ~~LOC~~ SOLN: 0.0000 [IU] | Freq: Three times a day (TID) | SUBCUTANEOUS | Status: DC

## 2015-06-25 NOTE — Progress Notes (Signed)
30 y.o. G1P0 [redacted]w[redacted]d HD#1 admitted for 11 WKS, BLEEDING, BURNING STOMACH PAIN.  Pt currently stable with no c/o this morning.  Pt's sugar was 54 just before dinner last night- pt hadn't eaten at 6 pm- resolved with snack and food. Sugar at 2 pm had been 130. Postprandial sugar was 183 and AM fasting was 166.  Filed Vitals:   06/24/15 1419 06/24/15 1808 06/24/15 2153 06/25/15 0608  BP: 140/84 122/85 128/83 129/82  Pulse: 86 102 87 88  Temp: 98.4 F (36.9 C) 98.5 F (36.9 C) 99.9 F (37.7 C) 99.1 F (37.3 C)  TempSrc: Oral Axillary Oral Oral  Resp: 18 18 16 14   Height:      Weight:    72.577 kg (160 lb)  SpO2: 100% 100% 100% 100%    Lungs CTA Cor RRR Abd  Soft, gravid, nontender Ex SCDs FHTs  pending today.  Results for orders placed or performed during the hospital encounter of 06/24/15 (from the past 24 hour(s))  Urinalysis, Routine w reflex microscopic (not at [facility])     Status: Abnormal   Collection Time: 06/24/15 11:30 AM  Result Value Ref Range   Color, Urine YELLOW YELLOW   APPearance CLEAR CLEAR   Specific Gravity, Urine 1.020 1.005 - 1.030   pH 6.5 5.0 - 8.0   Glucose, UA >1000 (Christine Reed) NEGATIVE mg/dL   Hgb urine dipstick MODERATE (Christine Reed) NEGATIVE   Bilirubin Urine NEGATIVE NEGATIVE   Ketones, ur NEGATIVE NEGATIVE mg/dL   Protein, ur >300 (Christine Reed) NEGATIVE mg/dL   Nitrite NEGATIVE NEGATIVE   Leukocytes, UA NEGATIVE NEGATIVE  Urine microscopic-add on     Status: Abnormal   Collection Time: 06/24/15 11:30 AM  Result Value Ref Range   Squamous Epithelial / LPF 0-5 (Christine Reed) NONE SEEN   WBC, UA 6-30 0 - 5 WBC/hpf   RBC / HPF 0-5 0 - 5 RBC/hpf   Bacteria, UA FEW (Christine Reed) NONE SEEN  Wet prep, genital     Status: Abnormal   Collection Time: 06/24/15 12:10 PM  Result Value Ref Range   Yeast Wet Prep HPF POC NONE SEEN NONE SEEN   Trich, Wet Prep NONE SEEN NONE SEEN   Clue Cells Wet Prep HPF POC PRESENT (Christine Reed) NONE SEEN   WBC, Wet Prep HPF POC MODERATE (Christine Reed) NONE SEEN   Sperm NONE SEEN   Glucose,  capillary     Status: Abnormal   Collection Time: 06/24/15 12:12 PM  Result Value Ref Range   Glucose-Capillary 197 (H) 65 - 99 mg/dL  Comprehensive metabolic panel     Status: Abnormal   Collection Time: 06/24/15 12:21 PM  Result Value Ref Range   Sodium 136 135 - 145 mmol/L   Potassium 4.2 3.5 - 5.1 mmol/L   Chloride 109 101 - 111 mmol/L   CO2 22 22 - 32 mmol/L   Glucose, Bld 221 (H) 65 - 99 mg/dL   BUN 17 6 - 20 mg/dL   Creatinine, Ser 1.51 (H) 0.44 - 1.00 mg/dL   Calcium 8.4 (L) 8.9 - 10.3 mg/dL   Total Protein 5.5 (L) 6.5 - 8.1 g/dL   Albumin 1.9 (L) 3.5 - 5.0 g/dL   AST 18 15 - 41 U/L   ALT 16 14 - 54 U/L   Alkaline Phosphatase 48 38 - 126 U/L   Total Bilirubin 0.5 0.3 - 1.2 mg/dL   GFR calc non Af Amer 46 (L) >60 mL/min   GFR calc Af Amer 54 (L) >60 mL/min  Anion gap 5 5 - 15  CBC with Differential/Platelet     Status: Abnormal   Collection Time: 06/24/15 12:21 PM  Result Value Ref Range   WBC 9.4 4.0 - 10.5 K/uL   RBC 2.46 (L) 3.87 - 5.11 MIL/uL   Hemoglobin 7.1 (L) 12.0 - 15.0 g/dL   HCT 19.9 (L) 36.0 - 46.0 %   MCV 80.9 78.0 - 100.0 fL   MCH 28.9 26.0 - 34.0 pg   MCHC 35.7 30.0 - 36.0 g/dL   RDW 13.8 11.5 - 15.5 %   Platelets 275 150 - 400 K/uL   Neutrophils Relative % 77 %   Neutro Abs 7.1 1.7 - 7.7 K/uL   Lymphocytes Relative 20 %   Lymphs Abs 1.9 0.7 - 4.0 K/uL   Monocytes Relative 2 %   Monocytes Absolute 0.2 0.1 - 1.0 K/uL   Eosinophils Relative 1 %   Eosinophils Absolute 0.1 0.0 - 0.7 K/uL   Basophils Relative 0 %   Basophils Absolute 0.0 0.0 - 0.1 K/uL  Type and screen     Status: None   Collection Time: 06/24/15 12:21 PM  Result Value Ref Range   ABO/RH(D) O POS    Antibody Screen NEG    Sample Expiration 06/27/2015   ABO/Rh     Status: None   Collection Time: 06/24/15 12:21 PM  Result Value Ref Range   ABO/RH(D) O POS   Ferritin     Status: None   Collection Time: 06/24/15 12:25 PM  Result Value Ref Range   Ferritin 61 11 - 307 ng/mL   Glucose, capillary     Status: Abnormal   Collection Time: 06/24/15  2:15 PM  Result Value Ref Range   Glucose-Capillary 130 (H) 65 - 99 mg/dL  Glucose, capillary     Status: Abnormal   Collection Time: 06/24/15  5:59 PM  Result Value Ref Range   Glucose-Capillary 54 (L) 65 - 99 mg/dL  Glucose, capillary     Status: Abnormal   Collection Time: 06/24/15  6:32 PM  Result Value Ref Range   Glucose-Capillary 104 (H) 65 - 99 mg/dL  Glucose, capillary     Status: Abnormal   Collection Time: 06/24/15  9:52 PM  Result Value Ref Range   Glucose-Capillary 183 (H) 65 - 99 mg/dL  Glucose, capillary     Status: Abnormal   Collection Time: 06/25/15  8:31 AM  Result Value Ref Range   Glucose-Capillary 166 (H) 65 - 99 mg/dL    Christine Reed:  HD#1  [redacted]w[redacted]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  Here for glucose control and IV antibiotics- Day 2; still has low grade temps.  P: 1. Glucose control: pt admitted for control with sliding scale. Plan for sugars to be above 60s but below 150s. Diabetic diet and diabetes consult.  D/w pt again importance of eating on Christine Reed regular schedule.  Will d/w nurses to follow sliding scale ONLY FOR PREPRANDIAL SUGARS unless CBG>200. 2. Renal impairment: will have Renal in hospital consult done on Monday. 3. Possible pyelo: will start Ancef for 48 hours. 4. Anemia: Pt is sickle trait positive but her anemia may be driven by her kidney impairment. Ferritin is normal -61- and will ask Renal for further recommendations. 5. EKG for eval of heart secondary Type 1 DM- normal sinus rhythm. 6. MFM consult in hospital consult on Monday.  7. Pt has optho and has been treated for "nerve damage" in past but was unable  to tell me if it was proliferative diabetic retinopathy. Pt will get visit with her optho post discharge.  8. Possible hypertension: no treatment needed if BPs remain <150/100. 9. Watch vaginal bleeding for now.    Christine Reed Christine Reed

## 2015-06-25 NOTE — Progress Notes (Signed)
Consults ordered in [place].  However, due to weekend status, did NOT call MFM or Renal directly.  Will need to be done on Monday.

## 2015-06-25 NOTE — Plan of Care (Signed)
Problem: Skin Integrity: Goal: Risk for impaired skin integrity will decrease Outcome: Completed/Met Date Met:  06/25/15 Patient is very mobile and can take care of her daily care needs.

## 2015-06-26 ENCOUNTER — Ambulatory Visit (HOSPITAL_COMMUNITY): Payer: BLUE CROSS/BLUE SHIELD

## 2015-06-26 LAB — GLUCOSE, CAPILLARY
GLUCOSE-CAPILLARY: 130 mg/dL — AB (ref 65–99)
GLUCOSE-CAPILLARY: 132 mg/dL — AB (ref 65–99)
GLUCOSE-CAPILLARY: 316 mg/dL — AB (ref 65–99)
Glucose-Capillary: 144 mg/dL — ABNORMAL HIGH (ref 65–99)
Glucose-Capillary: 58 mg/dL — ABNORMAL LOW (ref 65–99)
Glucose-Capillary: 94 mg/dL (ref 65–99)

## 2015-06-26 LAB — PROTEIN / CREATININE RATIO, URINE
Creatinine, Urine: 121 mg/dL
Protein Creatinine Ratio: 6.19 mg/mg{Cre} — ABNORMAL HIGH (ref 0.00–0.15)
TOTAL PROTEIN, URINE: 749 mg/dL

## 2015-06-26 LAB — GC/CHLAMYDIA PROBE AMP (~~LOC~~) NOT AT ARMC
CHLAMYDIA, DNA PROBE: NEGATIVE
NEISSERIA GONORRHEA: NEGATIVE

## 2015-06-26 MED ORDER — INSULIN ASPART 100 UNIT/ML ~~LOC~~ SOLN
0.0000 [IU] | Freq: Four times a day (QID) | SUBCUTANEOUS | Status: DC
  Administered 2015-06-26: 2 [IU] via SUBCUTANEOUS

## 2015-06-26 MED ORDER — INSULIN NPH (HUMAN) (ISOPHANE) 100 UNIT/ML ~~LOC~~ SUSP
8.0000 [IU] | Freq: Two times a day (BID) | SUBCUTANEOUS | Status: DC
  Administered 2015-06-26 – 2015-06-28 (×4): 8 [IU] via SUBCUTANEOUS

## 2015-06-26 MED ORDER — INSULIN ASPART 100 UNIT/ML ~~LOC~~ SOLN
0.0000 [IU] | Freq: Three times a day (TID) | SUBCUTANEOUS | Status: DC
  Administered 2015-06-26: 3 [IU] via SUBCUTANEOUS
  Administered 2015-06-26: 4 [IU] via SUBCUTANEOUS
  Administered 2015-06-27: 3 [IU] via SUBCUTANEOUS
  Administered 2015-06-27 (×2): 1 [IU] via SUBCUTANEOUS
  Administered 2015-06-28 (×2): 3 [IU] via SUBCUTANEOUS

## 2015-06-26 MED ORDER — INSULIN ASPART 100 UNIT/ML ~~LOC~~ SOLN: 0.0000 [IU] | Freq: Three times a day (TID) | SUBCUTANEOUS | Status: DC

## 2015-06-26 NOTE — Progress Notes (Signed)
Diabetes Treatment Program Recommendations  ADA Standards of Care 2017 Diabetes in Pregnancy Target Glucose Ranges:  Fasting: 60 - 90 mg/dL Preprandial: 60 - 105 mg/dL 1 hr postprandial: Less than 140mg /dL (from first bite of meal) 2 hr postprandial: Less than 120 mg/dL (from first bit of meal)  Results for ERCILIA, RESCH (MRN [id number]) as of 06/26/2015 13:02  Ref. Range 06/25/2015 11:27 06/25/2015 14:08 06/25/2015 16:28 06/25/2015 19:14 06/25/2015 21:33 06/26/2015 00:05 06/26/2015 08:52  Glucose-Capillary Latest Ref Range: 65-99 mg/dL 222 (H) 154 (H) 195 (H) 180 (H) 214 (H) 130 (H) 316 (H)   Current diabetes medications orders:    Novolog moderate tid with meals and HS, NPH 12 units q AM  Note that patients blood sugars are greater than goal with fasting CBG=316 mg/dL.   According to medical record, patient weighs 73 kg and is 11wks 2days pregnant. She has history of Type 1 diabetes.  Based on "Diabetic Pregnant Patient" order set, patient's insulin needs are approximately 50 units total daily dose.   Please consider changing NPH to 8 units bid (in order to get 24 hour coverage).  Further patient will need carbohydrate coverage.  Consider 1 units for every 10 grams of CHO with meals.  Consider correcting blood sugars 2 post-prandial with 0-14 units correction scale (and d/c current correction scale).   Called and spoke with RN.  She states that she will let MD know upon rounding of diabetes coordinator's recommendations.  Thanks, Adah Perl, RN, BC-ADM Inpatient Diabetes Coordinator Pager [phone] (8a-5p)

## 2015-06-26 NOTE — Progress Notes (Signed)
Called and confirmed with Dr. Ouida Sills that post-prandial blood sugars are to be covered per MD order.  Thanks, Adah Perl, RN, BC-ADM Inpatient Diabetes Coordinator Pager [phone] (8a-5p)

## 2015-06-26 NOTE — Progress Notes (Addendum)
Called and spoke with Dr. Ouida Sills- regarding recommendations from Diabetes Coordinator.  Orders received.  Will follow.  Thanks, Adah Perl, RN, BC-ADM Inpatient Diabetes Coordinator Pager [phone] (8a-5p)

## 2015-06-26 NOTE — Consult Note (Addendum)
Renal Service Consult Note [facility]  SHILYNN RASCHER 06/26/2015 [place] Requesting Physician:  Dr Philis Pique  Reason for Consult:  Proteinuria HPI: The patient is a 30 y.o. year-old with type 1 DM, onset age 43 (52 now).  Patient recently found to be pregnant, estimated 11 weeks IUP.  First pregnancy, no hx of any prior HTN or renal disease.  UA shows > 300 proteinuria and serum albumin is low at 1.9.   Creat is 1.5, it was last tested here in Jan 2014 when it was 0.76.  Asked to see for renal insuff and proteinuria.       DM onset 2000.  Hx of "nerve damage" in the eye for which she got shots in her eye.  No known retinopathy or neuropathy.  Diabetic control per pt is off and on, has had recent A1C of 6, then 8, then 11.   Doesn't follow a healthy diet in general, likes to eat a lot of fast foods.  No hx of HTN.  BP's ok here.    NO hx CAD, pvd or CVA.  No abd pain, no dysuria, no nsaid's.      ROS  denies CP  no joint pain   no HA  no blurry vision  no rash  no diarrhea  no nausea/ vomiting  no dysuria  no difficulty voiding  no change in urine color    Past Medical History  Past Medical History  Diagnosis Date  . Diabetes mellitus     diagnosed at age 24   Past Surgical History  Past Surgical History  Procedure Laterality Date  . Tonsillectomy and adenoidectomy    . Lipo suction  2015   Family History  Family History  Problem Relation Age of Onset  . Cancer Maternal Grandmother     colon and breast  . Diabetes Maternal Grandfather   . Diabetes Mother   . Diabetes Father   . Diabetes Paternal Grandmother   . Diabetes Paternal Grandfather    Social History  reports that she has never smoked. She does not have any smokeless tobacco history on file. She reports that she does not drink alcohol or use illicit drugs. Allergies  Allergies  Allergen Reactions  . Iodine Rash    Oral and IV  . Nickel Rash   Home medications Prior to Admission  medications   Medication Sig Start Date End Date Taking? Authorizing Provider  diphenhydrAMINE (BENADRYL) 25 MG tablet Take 25 mg by mouth every 6 (six) hours as needed for allergies.    Yes Historical Provider, MD  insulin NPH Human (HUMULIN N,NOVOLIN N) 100 UNIT/ML injection Inject 12 Units into the skin every morning.   Yes Historical Provider, MD  insulin regular (NOVOLIN R,HUMULIN R) 100 units/mL injection Inject into the skin 3 (three) times daily before meals.   Yes Historical Provider, MD  Prenatal Vit-Fe Fumarate-FA (MULTIVITAMIN-PRENATAL) 27-0.8 MG TABS tablet Take 1 tablet by mouth daily at 12 noon.   Yes Historical Provider, MD   Liver Function Tests  Recent Labs Lab 06/24/15 1221  AST 18  ALT 16  ALKPHOS 48  BILITOT 0.5  PROT 5.5*  ALBUMIN 1.9*   No results for input(s): LIPASE, AMYLASE in the last 168 hours. CBC  Recent Labs Lab 06/24/15 1221  WBC 9.4  NEUTROABS 7.1  HGB 7.1*  HCT 19.9*  MCV 80.9  PLT 123XX123   Basic Metabolic Panel  Recent Labs Lab 06/24/15 1221  NA 136  K  4.2  CL 109  CO2 22  GLUCOSE 221*  BUN 17  CREATININE 1.51*  CALCIUM 8.4*    Filed Vitals:   06/25/15 2200 06/26/15 0547 06/26/15 1213 06/26/15 1259  BP: 123/82 129/77 117/76   Pulse: 91 91 94   Temp: 99.4 F (37.4 C) 98.9 F (37.2 C) 99.9 F (37.7 C) 98.8 F (37.1 C)  TempSrc: Oral Oral Oral Oral  Resp: 20 16 16    Height:      Weight:      SpO2: 100% 100% 100%    Exam Alert no distress No rash, cyanosis or gangrene Sclera anicteric, throat clear  No jvd or bruit Chest clear bilat RRR no MRG Abd soft ntnd no mass or ascites +bs GU defer MS no joint effusions or deformity Ext no LE or UE edema / no  wounds or ulcers Neuro is alert, Ox 3 , nf  UA > 1000 glu, >300 prot, 6-30 wbc, 0-5 rbc, 1.020 Alb 1.9   Glu 221  Date  Creat 2009  0.76 2012  0.80 2013  0.63 2014  0.76 May 2017 1.51   Assessment: 1. 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  Ne recent labs available. Last UA from 2013 had no protein, last creat from 2014 showed creat 0.76.  No indication for renal biopsy , this could be considered after delivery if there is concern for non-diabetic renal disease.  Supportive care.  Counseled that CKD in about 1/3 of cases can get worse/ progress w pregnancy.  No indication for BP medications. She may be at risk for VTE with significant hypoalbuminemia and neph syndrome. Will d/w my colleagues.  Avoid all ACEi/ ARB in pregnancy.  Get renal US, lipid panel and UPC ratio.  Will f/u tomorrow.  2. IUP 11 wks 3. TYpe 1 DM   Plan - as above  Kelly Splinter MD [employer] pager [phone]    cell [phone] 06/26/2015, 5:18 PM

## 2015-06-26 NOTE — Progress Notes (Signed)
Nutrition  Discussion with pt about typical diet at home and understanding of CHO counting Pt reports: ~ she understands her diabetic diet parameters, and how to count CHO's, but does  not follow diet or count CHO's ~ reports she has been instructed, prior to hospitalization, to dose insulin 3 units per 15 g of CHO ~ has had no appetite recently, but this is improved today and thinks the pepcid has helped  I reviewed changes in diet plan, now that she is pregnant. Pt repeated back to me twice the current CHO goals for each meal, (30 g B, 30 g snacks, 45-50 g L & D) I asked her to eliminate juice from diet and fruit at breakfast. If diagnosed with nephrotic syn, I asked her to moderate her protein intake, to 3 - 4  oz per meal and no more that 16 oz of milk each day. Typical protein prescription for nephrotic syn when pregnant is 1 - 1.2 g/kg/day (  70-80 g/day)  Pt to ask to speak to RD again if questions arise.  Christine Reed M.Fredderick Severance LDN Neonatal Nutrition Support Specialist/RD III Pager [phone]      Phone [phone]

## 2015-06-27 ENCOUNTER — Inpatient Hospital Stay (HOSPITAL_COMMUNITY): Payer: BLUE CROSS/BLUE SHIELD

## 2015-06-27 ENCOUNTER — Encounter (HOSPITAL_COMMUNITY): Payer: Self-pay | Admitting: *Deleted

## 2015-06-27 LAB — GLUCOSE, CAPILLARY
GLUCOSE-CAPILLARY: 108 mg/dL — AB (ref 65–99)
GLUCOSE-CAPILLARY: 77 mg/dL (ref 65–99)
GLUCOSE-CAPILLARY: 125 mg/dL — AB (ref 65–99)
GLUCOSE-CAPILLARY: 155 mg/dL — AB (ref 65–99)
Glucose-Capillary: 175 mg/dL — ABNORMAL HIGH (ref 65–99)
Glucose-Capillary: 118 mg/dL — ABNORMAL HIGH (ref 65–99)

## 2015-06-27 LAB — URINE MICROSCOPIC-ADD ON: Bacteria, UA: NONE SEEN

## 2015-06-27 LAB — LIPID PANEL
CHOLESTEROL: 232 mg/dL — AB (ref 0–200)
HDL: 69 mg/dL (ref >40)
LDL CALC: 131 mg/dL — AB (ref 0–99)
Total CHOL/HDL Ratio: 3.4 RATIO
Triglycerides: 162 mg/dL — ABNORMAL HIGH (ref <150)
VLDL: 32 mg/dL (ref 0–40)

## 2015-06-27 LAB — URINALYSIS, ROUTINE W REFLEX MICROSCOPIC
BILIRUBIN URINE: NEGATIVE
Glucose, UA: NEGATIVE mg/dL
KETONES UR: NEGATIVE mg/dL
Leukocytes, UA: NEGATIVE
NITRITE: NEGATIVE
SPECIFIC GRAVITY, URINE: 1.015 (ref 1.005–1.030)
pH: 7 (ref 5.0–8.0)

## 2015-06-27 MED ORDER — INSULIN ASPART 100 UNIT/ML ~~LOC~~ SOLN
2.0000 [IU] | Freq: Three times a day (TID) | SUBCUTANEOUS | Status: DC
  Administered 2015-06-27 – 2015-06-28 (×2): 2 [IU] via SUBCUTANEOUS

## 2015-06-27 NOTE — Progress Notes (Signed)
Nutrition: consult for low sodium diet education  Pt provided with " My food plan for Gestational Diabetes" which will aid in CHO counting as well as identification of high sodium foods to avoid. Additional handout onlow sodium diet proved to pt, which includes list of high sodium foods to avoid. Discussed daily sodium intake limits. Reading of labels.  Answered pts questions.  Weyman Rodney M.Fredderick Severance LDN Neonatal Nutrition Support Specialist/RD III Pager [phone]      Phone [phone]

## 2015-06-27 NOTE — Progress Notes (Signed)
30 y.o. G1P0 [redacted]w[redacted]d HD#3 admitted for 11 WKS, BLEEDING, BURNING STOMACH PAIN.  Pt currently stable with no c/o and no more bleeding.  Filed Vitals:   06/26/15 1259 06/26/15 1741 06/26/15 2145 06/27/15 0537  BP:  128/79 127/78 125/82  Pulse:  88 88 94  Temp: 98.8 F (37.1 C) 99 F (37.2 C) 98.6 F (37 C) 99 F (37.2 C)  TempSrc: Oral Oral Oral Oral  Resp:  16 16 15   Height:      Weight:      SpO2:  100% 100% 100%    Lungs CTA Cor RRR Abd  Soft, gravid, nontender Ex SCDs FHTs  present   Results for orders placed or performed during the hospital encounter of 06/24/15 (from the past 24 hour(s))  Glucose, capillary     Status: Abnormal   Collection Time: 06/26/15  8:52 AM  Result Value Ref Range   Glucose-Capillary 316 (H) 65 - 99 mg/dL   Comment 1 Notify RN   Glucose, capillary     Status: Abnormal   Collection Time: 06/26/15  1:36 PM  Result Value Ref Range   Glucose-Capillary 132 (H) 65 - 99 mg/dL  Glucose, capillary     Status: Abnormal   Collection Time: 06/26/15  3:40 PM  Result Value Ref Range   Glucose-Capillary 144 (H) 65 - 99 mg/dL  Glucose, capillary     Status: Abnormal   Collection Time: 06/26/15  8:27 PM  Result Value Ref Range   Glucose-Capillary 58 (L) 65 - 99 mg/dL  Glucose, capillary     Status: None   Collection Time: 06/26/15  9:50 PM  Result Value Ref Range   Glucose-Capillary 94 65 - 99 mg/dL   Comment 1 Document in Chart   Protein / creatinine ratio, urine     Status: Abnormal   Collection Time: 06/26/15 11:00 PM  Result Value Ref Range   Creatinine, Urine 121.00 mg/dL   Total Protein, Urine 749 mg/dL   Protein Creatinine Ratio 6.19 (H) 0.00 - 0.15 mg/mg[Cre]    A:  HD#3  [redacted]w[redacted]d with Type 1 DM and renal insufficiency.  P: 1.  Appreciate Renal consult.  Renal US done- awaiting report.  Would also appreciate recommendations regarding anemia from renal disease as well. 2.  Sugars are very brittle- varying between 58 to 316.  May need  endocrine consult.  3.   Urine culture was multiple species.  Ancef stopped at 48 hours.  Marc Sivertsen A

## 2015-06-27 NOTE — Consult Note (Signed)
MFM consult  30 yr old G1P0 at [redacted]w[redacted]d with type I diabetes class F referred by Dr. Philis Pique for consult.  Past OB hx: none PMH: diagnosed with type I diabetes at age 83; has had multiple episodes of DKA; borderline hypertension; nephrotic range proteinuria PSH: tonsillectomy; liposuction Medications: insulin, PNV Allergies: KNDA Social hx: negative  O: BPs 114/76-140/94  Labs significant for: urine protein/creatinine ratio of 6.19; creatinine 1.5, hemoglobin 7.1, ferritin 61  I counseled the patient as follows: 1. Diabetes: Discussed increased risks in pregnancy include: fetal macrosomia, fetal growth restriction, shoulder dystocia, and increased risk of requiring a Cesarean delivery. There is also an increased risk of developing preeclampsia during the pregnancy. There is an increased risk of congenital malformations related to level of diabetic control in the first trimester (we do not have a recent hemoblobin A1C) I discussed there is an increased risk of stillbirth, neonatal hypoglycemia, neonatal jaundice, and neonatal electrolyte disturbances. I recommend strict glucose control maintaining fasting blood sugars <90 and 2 hour postprandial values <120. Patient is followed by Endocrinology- however has not seen them since becoming pregnant. Is currently on  NPH 8 units in the am and qhs and sliding scale novolog with meals. Instructed to see her Endocrinologist asap and would recommend review of her sugars at least weekly. Recommend patient call immediately with sugars <60 or >200. Discussed risk of DKA. Encouraged regular eating times. I recommend starting fetal kick counts- at [redacted] weeks gestation. I recommend starting antenatal testing with either weekly biophysical profiles or twice weekly nonstress tests and weekly amniotic fluid index starting at 32 weeks.  I recommend following fetal growth every 4 weeks. I recommend delivery by estimated due date or sooner if clinically  indicated. Baseline LFTs and platelets are normal. Recommend check TSH, hemoglobin A1C, EKG, maternal echocardiogram The patient should have an Ophthamology exam if not recently performed. Recommend check hemoglobin A1C every trimester.  Given the increased risk of congenital anomalies recommend fetal echocardiogram at 18-20 weeks. 2. Proteinuria: - creatinine is 1.5 with P/C ratio of 6.19 - recommend follow creatinine at least monthly; discussed possibility of worsening renal function in pregnancy - likely due to poor diabetic control - is followed by Nephrology - discussed increased risk of fetal growth restriction and preeclampsia - recommend low dose aspirin 81mg /day- as use in a high risk population has been shown to reduce risk of preeclampsia by 30% - given nephrotic range proteinuria and hypoalbuminemia patient is at increased risk of thromboembolism - recommend prophylactic lovenox 40mg  qd until 6 weeks postpartum- instructed patient must hold 24 hours prior to induction and should hold if any concerns of bleeding, rupture of membranes, or labor until evaluated by OB 3. Anemia: - likely due to chronic kidney disease - will defer to Nephrology if erythropoietin is indicated - normal ferritin level - would recommend transfusion for hgb <7 3. Borderline hypertension: - patient does not have prior diagnosis of hypertension - has had 2 mild range blood pressures in the hospital I discussed that women with preexistent hypertension are at increased risk of adverse pregnancy outcome, including preeclampsia, abruption, fetal growth restriction, and perinatal death. The risk increases with severity of hypertension and presence of end-organ damage. Risk of superimposed preeclampsia is 10 to 25 %; risk of abruption is 0.7 to 1.5 %; and risk of fetal growth restriction is 8 to 16 %. I also discussed that risk of preterm delivery is increased and is usually iatrogenic from the complications listed  above. Risk  of preterm delivery in women with chronic hypertension is 12-34%. There is also an increased risk of requiring C section for the above reasons. Patient' hypertension is currently managed without medication. .I would recommend targeting therapy to keep systolic blood pressures 123456 and diastolic blood pressures 99991111. If medical management is required would recommend labetalol or nifedipine. I recommend obtaining baseline studies: EKG, 24 hour urine for total protein, CBC, AST, ALT, BUN, creatinine, and uric acid. I recommend fetal surveillance as above and low dose aspirin as above. Recommend close surveillance for the development of signs/symptoms of preeclampsia. 4. Patient has first trimester screen scheduled for 5/25. 5. Recommend offer maternal serum AFP at 15-20 weeks. 6. Recommend fetal anatomic survey at 18-20 weeks.  I spent a total of 45 minutes with the patient of which >50% was in face to face consultation. Please call with questions.  [place], MD

## 2015-06-27 NOTE — Progress Notes (Signed)
Update:  1.  Pt c/o CVAT this am- UA rechecked and was negative.  CC culture sent again.  Pain probably from back ache.  2.  Renal indicated no acute treatment now but suggested pt may need to have anticoagulation for higher risk of thrombosis in pregancy with serum albumin less than 2.  Will defer to MFM. Will need to watch BPs and Na- last Na on 5-13 was 136.    3.  MFM- pt still waiting for consult.  Will need their recommendations re: anemia from renal insufficiency and need for anticoag/prophylaxis for thromboembolism secondary low serum albumin.  4.  D/w DM coordinator- pt is very brittle and needs to have insulin only when she is actually eating, especially since she does not eat on a very regular schedule.  She agrees and will get ss insulin that will be based on pts carb intake with meals.  Can adjust scale as pt gets used to doing this.  Sugars today were much better with less lows and less very highs.  Pt was asking for mid afternoon snack at appropriate time.   5.  Will repeat CMET tomorrow for serum albumin and Na.

## 2015-06-27 NOTE — Progress Notes (Addendum)
To speak with patient regarding home insulin regimen.    Diabetes in Pregnancy Target Glucose Ranges:  Fasting: 60 - 90 mg/dL Preprandial: 60 - 105 mg/dL 1 hr postprandial: Less than 140mg /dL (from first bite of meal) 2 hr postprandial: Less than 120 mg/dL (from first bite of meal)  Insulin:   NPH 8 units with breakfast and 8 units at bedtime.  Novolog 1 unit for every 10 grams of carbohydrate.  Check blood sugars before eating and cover with Novolog if greater than 150 mg/dL. 150-200 mg/dL- 2 units,   201-250 mg/dL-4 units,  251-300 mg/dL-6 units,  301-350 mg/dL-8 units,  If greater than 351 mg/dL Give 11 units and Call MD for further orders-Cover patient's blood sugars prior to eating meal.  Per dietician Recommendations:  CHO goals for each meal  Breakfast- 30 grams CHO Snacks- 30 grams CHO Lunch-45-50 grams CHO Dinner- 45-50 grams CHO   **Eliminate juice from diet and fruit at breakfast.  Call for any blood sugars greater than 200 mg/dL or less than 60 mg/dL.  Please write down all blood sugars with times and bring to all doctor's appointments.  Please also bring glucose meter.   Reviewed instructions with Dr. Philis Pique and MD from MFM and they agreed with plan.  Patient see's Dr. Chalmers Cater and states that she is willing to see her during pregnancy.  Called and spoke with Dr. Almetta Lovely nurse regarding whether they can follow patient during pregnancy.  She states she will send message to Dr. Chalmers Cater to discuss. Patient already has appointment on June 1 at 10:45 a.  Nurse will ask Dr. Chalmers Cater if patient needs to be seen sooner.  She states that patient needs to know that she needs to be seen by Dr. Chalmers Cater at least monthly and maybe more often and that coming to appointments is very important.  She also asked that D/C summary be faxed to Dr. Chalmers Cater at [phone]. Caren Griffins.  Reviewed instructions with patient and explained importance of follow-up. She verbalized understanding and was able  to teach back.  Gave MD's copy of regimen and patient. Will follow-up on 06/28/15.   Thanks, Adah Perl, RN, BC-ADM Inpatient Diabetes Coordinator Pager [phone] (8a-5p)

## 2015-06-27 NOTE — Progress Notes (Addendum)
Diabetes Treatment Program Recommendations  ADA Standards of Care 2017 Diabetes in Pregnancy Target Glucose Ranges:  Fasting: 60 - 90 mg/dL Preprandial: 60 - 105 mg/dL 1 hr postprandial: Less than 140mg /dL (from first bite of meal) 2 hr postprandial: Less than 120 mg/dL (from first bite of meal)  Results for Christine Reed, Christine Reed (MRN [id number]) as of 06/27/2015 10:12  Ref. Range 06/26/2015 08:52 06/26/2015 13:36 06/26/2015 15:40 06/26/2015 20:27 06/26/2015 21:50 06/27/2015 08:17  Glucose-Capillary Latest Ref Range: 65-99 mg/dL 316 (H) 132 (H) 144 (H) 58 (L) 94 108 (H)   Note fasting blood sugar improved this morning.  Patient did have mild hypoglycemic episode on 06/26/15 of 58 mg/dL. However patient did receive 12 units NPH yesterday morning. NPH dose changed to 8 units bid yesterday and carbohydrate coverage added 1 unit for every 10 grams of CHO.  Discussed with Dr. Philis Pique and she would like to not cover postprandial blood sugars at this time and instead cover pre-prandials at 150 mg/dL in an effort to avoid hypoglycemia.   Per verbal order changed Novolog correction scale to start at 151 mg/dL per MD order.  Will see patient this afternoon to discuss insulin regimen with her at home.  Will need follow-up with CDE regarding insulin/diabetes management after d/c also.  Thanks, Adah Perl, RN, BC-ADM Inpatient Diabetes Coordinator Pager [phone] (8a-5p)

## 2015-06-27 NOTE — Progress Notes (Signed)
  [facility] Progress Note   Subjective: no complaints  Filed Vitals:   06/26/15 1741 06/26/15 2145 06/27/15 0537 06/27/15 0806  BP: 128/79 127/78 125/82   Pulse: 88 88 94   Temp: 99 F (37.2 C) 98.6 F (37 C) 99 F (37.2 C)   TempSrc: Oral Oral Oral   Resp: 16 16 15    Height:      Weight:    72.349 kg (159 lb 8 oz)  SpO2: 100% 100% 100%     Inpatient medications: . docusate sodium  100 mg Oral Daily  . famotidine  20 mg Oral BID  . insulin aspart  0-6 Units Subcutaneous TID WC  . insulin aspart  2-11 Units Subcutaneous TID WC  . insulin NPH Human  8 Units Subcutaneous BID AC & HS  . prenatal multivitamin  1 tablet Oral Q1200     acetaminophen, calcium carbonate, zolpidem  Exam: Alert no distress No rash, cyanosis or gangrene Sclera anicteric, throat clear  No jvd or bruit Chest clear bilat RRR no MRG Abd soft ntnd no mass or ascites +bs GU defer MS no joint effusions or deformity Ext no LE or UE edema / no wounds or ulcers Neuro is alert, Ox 3 , nf  UA > 1000 glu, >300 prot, 6-30 wbc, 0-5 rbc, 1.020 Alb 1.9 Glu 221  DateCreat 20090.76 20120.80 20130.63 20140.76 May 20171.51   Assessment: 1. Renal insufficiency - prob underlying diabetic nephropathy from long-standing DM type 1.  Main issues during pregnancy will be Na control, BP control. There is significantly increased risk of eclampsia and preeclampsia given underlying CKD. Another issue to consider is higher risk of venous thromboembolism in pregnant patients with nephrotic syndrome; some experts recommend treating with prophylactic anticoagulation particularly when serum albumin is less than 2. Will defer this decision to primary team.  Have arranged for renal f/u appt in mid June. Will sign off. Please call w questions.  2. IUP 11 wks 3. TYpe 1 DM   Plan - as above   Kelly Splinter  MD [facility] pager [phone]    cell [phone] 06/27/2015, 11:38 AM    Recent Labs Lab 06/24/15 1221  NA 136  K 4.2  CL 109  CO2 22  GLUCOSE 221*  BUN 17  CREATININE 1.51*  CALCIUM 8.4*    Recent Labs Lab 06/24/15 1221  AST 18  ALT 16  ALKPHOS 48  BILITOT 0.5  PROT 5.5*  ALBUMIN 1.9*    Recent Labs Lab 06/24/15 1221  WBC 9.4  NEUTROABS 7.1  HGB 7.1*  HCT 19.9*  MCV 80.9  PLT 275

## 2015-06-28 LAB — COMPREHENSIVE METABOLIC PANEL
ALT: 10 U/L — AB (ref 14–54)
AST: 16 U/L (ref 15–41)
Albumin: 1.7 g/dL — ABNORMAL LOW (ref 3.5–5.0)
Alkaline Phosphatase: 48 U/L (ref 38–126)
Anion gap: 6 (ref 5–15)
BILIRUBIN TOTAL: 0.6 mg/dL (ref 0.3–1.2)
BUN: 21 mg/dL — ABNORMAL HIGH (ref 6–20)
CALCIUM: 8 mg/dL — AB (ref 8.9–10.3)
CHLORIDE: 107 mmol/L (ref 101–111)
CO2: 20 mmol/L — ABNORMAL LOW (ref 22–32)
CREATININE: 1.42 mg/dL — AB (ref 0.44–1.00)
GFR, EST AFRICAN AMERICAN: 58 mL/min — AB (ref >60)
GFR, EST NON AFRICAN AMERICAN: 50 mL/min — AB (ref >60)
Glucose, Bld: 134 mg/dL — ABNORMAL HIGH (ref 65–99)
Potassium: 3.8 mmol/L (ref 3.5–5.1)
Sodium: 133 mmol/L — ABNORMAL LOW (ref 135–145)
TOTAL PROTEIN: 5.2 g/dL — AB (ref 6.5–8.1)

## 2015-06-28 LAB — GLUCOSE, CAPILLARY
GLUCOSE-CAPILLARY: 159 mg/dL — AB (ref 65–99)
GLUCOSE-CAPILLARY: 131 mg/dL — AB (ref 65–99)
Glucose-Capillary: 137 mg/dL — ABNORMAL HIGH (ref 65–99)

## 2015-06-28 LAB — URINE CULTURE

## 2015-06-28 MED ORDER — INSULIN ASPART 100 UNIT/ML ~~LOC~~ SOLN: 0.0000 [IU] | Freq: Three times a day (TID) | SUBCUTANEOUS | Status: DC

## 2015-06-28 MED ORDER — FAMOTIDINE 20 MG PO TABS: 20.0000 mg | ORAL_TABLET | Freq: Two times a day (BID) | ORAL | Status: DC

## 2015-06-28 MED ORDER — INSULIN LISPRO 100 UNIT/ML ~~LOC~~ SOLN: 2.0000 [IU] | Freq: Three times a day (TID) | SUBCUTANEOUS | Status: DC

## 2015-06-28 MED ORDER — ASPIRIN 81 MG PO CHEW: 81.0000 mg | CHEWABLE_TABLET | Freq: Once | ORAL | Status: DC

## 2015-06-28 MED ORDER — ENOXAPARIN SODIUM 40 MG/0.4ML ~~LOC~~ SOLN: 40.0000 mg | SUBCUTANEOUS | Status: DC

## 2015-06-28 MED ORDER — ENOXAPARIN SODIUM 40 MG/0.4ML ~~LOC~~ SOLN
40.0000 mg | SUBCUTANEOUS | Status: DC
  Administered 2015-06-28: 40 mg via SUBCUTANEOUS
  Filled 2015-06-28 (×2): qty 0.4

## 2015-06-28 MED ORDER — INSULIN NPH (HUMAN) (ISOPHANE) 100 UNIT/ML ~~LOC~~ SUSP: 8.0000 [IU] | Freq: Two times a day (BID) | SUBCUTANEOUS | Status: DC

## 2015-06-28 MED ORDER — ASPIRIN 81 MG PO CHEW
81.0000 mg | CHEWABLE_TABLET | Freq: Once | ORAL | Status: AC
  Administered 2015-06-28: 81 mg via ORAL
  Filled 2015-06-28: qty 1

## 2015-06-28 MED ORDER — INSULIN NPH (HUMAN) (ISOPHANE) 100 UNIT/ML ~~LOC~~ SUSP: SUBCUTANEOUS | Status: DC

## 2015-06-28 MED ORDER — INSULIN ASPART 100 UNIT/ML ~~LOC~~ SOLN: 2.0000 [IU] | Freq: Three times a day (TID) | SUBCUTANEOUS | Status: DC

## 2015-06-28 MED ORDER — INSULIN ISOPHANE HUMAN 100 UNIT/ML KWIKPEN: PEN_INJECTOR | SUBCUTANEOUS | Status: DC

## 2015-06-28 MED ORDER — INSULIN LISPRO 100 UNIT/ML (KWIKPEN): PEN_INJECTOR | SUBCUTANEOUS | Status: DC

## 2015-06-28 MED FILL — FAMOTIDINE 20 MG TABLET: 20 | 30 days supply | Qty: 60 | Fill #0

## 2015-06-28 MED FILL — UNIFINE PENTIPS 31GX3/16: 31G X 5 MM | 25 days supply | Qty: 100 | Fill #0

## 2015-06-28 MED FILL — BD UF INS SYR 1 ML 31GX5/16: 31G X 5/16" | 25 days supply | Qty: 100 | Fill #0

## 2015-06-28 MED FILL — HUMALOG 100 UNITS/ML KWIKPE: 100 | 45 days supply | Qty: 15 | Fill #0

## 2015-06-28 MED FILL — ENOXAPARIN 40 MG/0.4 ML SYR: 40 | 30 days supply | Qty: 12 | Fill #0

## 2015-06-28 NOTE — Progress Notes (Signed)
Patient ID: Christine Reed, female   DOB: May 06, 1985, 30 y.o.   MRN: [id number]   30 yo G1P0 @ 11+4 with Class F Diabetes mellitus HD#4  S: Pt overall feeling better. Comfortable with current plan for glucose management and anticoagulation O:  Filed Vitals:   06/27/15 1200 06/27/15 1757 06/27/15 2219 06/28/15 0524  BP: 124/80 129/86 123/77 130/82  Pulse: 91 92 99 94  Temp: 99 F (37.2 C) 100 F (37.8 C) 98.6 F (37 C) 99 F (37.2 C)  TempSrc: Oral Oral Oral Oral  Resp: 16 16 16 18   Height:      Weight:      SpO2: 100% 100% 100% 100%   AOX3 Normal work of breathing Results for orders placed or performed during the hospital encounter of 06/24/15 (from the past 24 hour(s))  Glucose, capillary     Status: None   Collection Time: 06/27/15 10:56 AM  Result Value Ref Range   Glucose-Capillary 77 65 - 99 mg/dL  Urinalysis, Routine w reflex microscopic (not at [facility])     Status: Abnormal   Collection Time: 06/27/15 11:00 AM  Result Value Ref Range   Color, Urine YELLOW YELLOW   APPearance CLEAR CLEAR   Specific Gravity, Urine 1.015 1.005 - 1.030   pH 7.0 5.0 - 8.0   Glucose, UA NEGATIVE NEGATIVE mg/dL   Hgb urine dipstick SMALL (A) NEGATIVE   Bilirubin Urine NEGATIVE NEGATIVE   Ketones, ur NEGATIVE NEGATIVE mg/dL   Protein, ur >300 (A) NEGATIVE mg/dL   Nitrite NEGATIVE NEGATIVE   Leukocytes, UA NEGATIVE NEGATIVE  Urine microscopic-add on     Status: Abnormal   Collection Time: 06/27/15 11:00 AM  Result Value Ref Range   Squamous Epithelial / LPF 0-5 (A) NONE SEEN   WBC, UA 0-5 0 - 5 WBC/hpf   RBC / HPF 0-5 0 - 5 RBC/hpf   Bacteria, UA NONE SEEN NONE SEEN  Glucose, capillary     Status: Abnormal   Collection Time: 06/27/15  1:25 PM  Result Value Ref Range   Glucose-Capillary 175 (H) 65 - 99 mg/dL  Glucose, capillary     Status: Abnormal   Collection Time: 06/27/15  3:22 PM  Result Value Ref Range   Glucose-Capillary 118 (H) 65 - 99 mg/dL  Glucose, capillary     Status:  Abnormal   Collection Time: 06/27/15  6:27 PM  Result Value Ref Range   Glucose-Capillary 125 (H) 65 - 99 mg/dL  Glucose, capillary     Status: Abnormal   Collection Time: 06/27/15  9:47 PM  Result Value Ref Range   Glucose-Capillary 155 (H) 65 - 99 mg/dL  Comprehensive metabolic panel     Status: Abnormal   Collection Time: 06/28/15  5:30 AM  Result Value Ref Range   Sodium 133 (L) 135 - 145 mmol/L   Potassium 3.8 3.5 - 5.1 mmol/L   Chloride 107 101 - 111 mmol/L   CO2 20 (L) 22 - 32 mmol/L   Glucose, Bld 134 (H) 65 - 99 mg/dL   BUN 21 (H) 6 - 20 mg/dL   Creatinine, Ser 1.42 (H) 0.44 - 1.00 mg/dL   Calcium 8.0 (L) 8.9 - 10.3 mg/dL   Total Protein 5.2 (L) 6.5 - 8.1 g/dL   Albumin 1.7 (L) 3.5 - 5.0 g/dL   AST 16 15 - 41 U/L   ALT 10 (L) 14 - 54 U/L   Alkaline Phosphatase 48 38 - 126 U/L   Total Bilirubin  0.6 0.3 - 1.2 mg/dL   GFR calc non Af Amer 50 (L) >60 mL/min   GFR calc Af Amer 58 (L) >60 mL/min   Anion gap 6 5 - 15  Glucose, capillary     Status: Abnormal   Collection Time: 06/28/15  8:52 AM  Result Value Ref Range   Glucose-Capillary 159 (H) 65 - 99 mg/dL   A/P: 30 yo G1P0 @ 11+4 with Class F Diabetes mellitus 1) Continue NPH & Novolog as recommended by Diabetes coordinator 2) ASA 81 mg 3) Start lovenox 40mg  SQ daily until 6 week PP 4) Obtain 12 lead EKG prior to Discharge 5) Last optho exam with Dr. Zadie Rhine approximately 1 year ago. D/W patient need for repeat exam in near future d/t pregnancy 6) Continue PNV, in addition to anemia of chronic disease pt also has sickle cell trait 7) Plan D/C home today

## 2015-06-28 NOTE — Progress Notes (Signed)
Call received from Dr. Almetta Lovely office. Patient has appointment at 10:15 am tomorrow 5/18 (needs to be there at 10:00a) for diabetes management.    Please fax d/c summary and labs to Dr. Almetta Lovely office, Attn: Caren [phone].  Thanks, Adah Perl, RN, BC-ADM Inpatient Diabetes Coordinator Pager [phone] (8a-5p)

## 2015-06-28 NOTE — Progress Notes (Signed)
D/c summary faxed to Dr. Almetta Lovely office at their request. Christine Reed

## 2015-06-28 NOTE — Discharge Summary (Signed)
Obstetric Discharge Summary Reason for Admission: Class F Diabetes Mellitus Prenatal Procedures: ultrasound Intrapartum Procedures: IV abx, Nephrology Consult, MFM consult, Diabetes Coordinator  HEMOGLOBIN  Date Value Ref Range Status  06/24/2015 7.1* 12.0 - 15.0 g/dL Final  03/09/2012 11.6* 12.2 - 16.2 g/dL Final   HCT  Date Value Ref Range Status  06/24/2015 19.9* 36.0 - 46.0 % Final   HCT, POC  Date Value Ref Range Status  03/09/2012 36.0* 37.7 - 47.9 % Final    Physical Exam:  General: alert, cooperative and appears stated age Results for orders placed or performed during the hospital encounter of 06/24/15 (from the past 24 hour(s))  Glucose, capillary     Status: None   Collection Time: 06/27/15 10:56 AM  Result Value Ref Range   Glucose-Capillary 77 65 - 99 mg/dL  Urinalysis, Routine w reflex microscopic (not at [facility])     Status: Abnormal   Collection Time: 06/27/15 11:00 AM  Result Value Ref Range   Color, Urine YELLOW YELLOW   APPearance CLEAR CLEAR   Specific Gravity, Urine 1.015 1.005 - 1.030   pH 7.0 5.0 - 8.0   Glucose, UA NEGATIVE NEGATIVE mg/dL   Hgb urine dipstick SMALL (A) NEGATIVE   Bilirubin Urine NEGATIVE NEGATIVE   Ketones, ur NEGATIVE NEGATIVE mg/dL   Protein, ur >300 (A) NEGATIVE mg/dL   Nitrite NEGATIVE NEGATIVE   Leukocytes, UA NEGATIVE NEGATIVE  Urine microscopic-add on     Status: Abnormal   Collection Time: 06/27/15 11:00 AM  Result Value Ref Range   Squamous Epithelial / LPF 0-5 (A) NONE SEEN   WBC, UA 0-5 0 - 5 WBC/hpf   RBC / HPF 0-5 0 - 5 RBC/hpf   Bacteria, UA NONE SEEN NONE SEEN  Glucose, capillary     Status: Abnormal   Collection Time: 06/27/15  1:25 PM  Result Value Ref Range   Glucose-Capillary 175 (H) 65 - 99 mg/dL  Glucose, capillary     Status: Abnormal   Collection Time: 06/27/15  3:22 PM  Result Value Ref Range   Glucose-Capillary 118 (H) 65 - 99 mg/dL  Glucose, capillary     Status: Abnormal   Collection Time: 06/27/15   6:27 PM  Result Value Ref Range   Glucose-Capillary 125 (H) 65 - 99 mg/dL  Glucose, capillary     Status: Abnormal   Collection Time: 06/27/15  9:47 PM  Result Value Ref Range   Glucose-Capillary 155 (H) 65 - 99 mg/dL  Comprehensive metabolic panel     Status: Abnormal   Collection Time: 06/28/15  5:30 AM  Result Value Ref Range   Sodium 133 (L) 135 - 145 mmol/L   Potassium 3.8 3.5 - 5.1 mmol/L   Chloride 107 101 - 111 mmol/L   CO2 20 (L) 22 - 32 mmol/L   Glucose, Bld 134 (H) 65 - 99 mg/dL   BUN 21 (H) 6 - 20 mg/dL   Creatinine, Ser 1.42 (H) 0.44 - 1.00 mg/dL   Calcium 8.0 (L) 8.9 - 10.3 mg/dL   Total Protein 5.2 (L) 6.5 - 8.1 g/dL   Albumin 1.7 (L) 3.5 - 5.0 g/dL   AST 16 15 - 41 U/L   ALT 10 (L) 14 - 54 U/L   Alkaline Phosphatase 48 38 - 126 U/L   Total Bilirubin 0.6 0.3 - 1.2 mg/dL   GFR calc non Af Amer 50 (L) >60 mL/min   GFR calc Af Amer 58 (L) >60 mL/min   Anion gap  6 5 - 15  Glucose, capillary     Status: Abnormal   Collection Time: 06/28/15  8:52 AM  Result Value Ref Range   Glucose-Capillary 159 (H) 65 - 99 mg/dL  '  Discharge Diagnoses: Class F Diabetes Mellitus, High Risk Pregnancy, Anemia of chronic disease  Discharge Information: Date: 06/28/2015 Activity: unrestricted Diet: routine Medications: PNV, ASA 81 MG, Lovenox 40mg , Pepcid 20mg , Humulog & Humulin Insulin at previously instructed doses Condition: improved Instructions: refer to practice specific booklet Discharge to: home Follow-up Information    Follow up with Donetta Potts, MD On 07/27/2015.   Specialty:  Nephrology   Why:  Arrive at 9:45 am on June 15th, 2017.  This is follow up appt with kidney doctor.     Contact information:   [place] [address] [phone]       Follow up with [place], [place], DO On 07/03/2015.   Specialty:  Family Medicine   Why:  You have an appointment with the [facility] @ 7:45 on 07/03/2015   Contact  information:   [place] [address] [phone]       Follow up with Jacelyn Pi, MD On 06/29/2015.   Specialty:  Endocrinology   Why:  Follow-up with Dr. Chalmers Cater at 10:15 on 06/29/2015   Contact information:   [place] [address] [phone]        Christine Reed H. 06/28/2015, 10:48 AM

## 2015-06-28 NOTE — Progress Notes (Signed)
Spoke with Caren Griffins from Dr. Almetta Lovely office.  She wanted to know when patient would be d/c'd.  Told her possibly today.  She states she will call back with appointment.  She states that Dr. Chalmers Cater will need to see patient at least every month.  Also talked with RN, Abby.  She states that correction scale had 2 values for 150-200 mg/dL.  Scale corrected as it was entered in error.  Called case manager Lannette.  Note that patient has insurance-[employer] through [employer] with high co-pays.  She has been unable to afford analog insulins due to high co-pay.  Attempting to find co-pay card for Novolog or voucher for Humalog.  Lanette states she will call financial counselor also.    Will follow.  Thanks, Adah Perl, RN, BC-ADM Inpatient Diabetes Coordinator Pager [phone] (8a-5p)

## 2015-06-28 NOTE — Progress Notes (Signed)
Spoke briefly with patient regarding home diabetes management.  She seems confident with diabetes regimen that has been prescribed. Reminded her of appointment tomorrow with Dr. Chalmers Cater.  She will be using Humalog insulin pen and Novolin NPH-vial.  Asked RN to call MD as patient will need insulin pen needles also at d/c.    Thanks, Adah Perl, RN, BC-ADM Inpatient Diabetes Coordinator Pager [phone] (8a-5p)

## 2015-06-28 NOTE — Care Management (Signed)
CM spoke to Dr. Mignon Pine regarding patient being sent home on Lovenox.  CM called [facility] outpatient pharmacy to find out cost when dc home.  Cost for patient per month will be 12$ month.  Reviewed this with patient and patient stated she will be able to afford that and verbalized understanding.  [facility] pharmacist stated that she should be able to go to any outpatient pharmacy that patient desires and get this copay.  Diabetes Coordinator will be back around noon to give patient voucher for insulin- humalog.

## 2015-06-28 NOTE — Care Management (Signed)
CM spoke to [place] here at [facility] regarding this patient asking her to see patient to see if patient would qualify for possible Medicaid.  Plans to see patient in room this am.  Will continue to follow.

## 2015-07-03 ENCOUNTER — Encounter: Payer: Self-pay | Admitting: Obstetrics and Gynecology

## 2015-07-03 ENCOUNTER — Encounter: Payer: Self-pay | Admitting: *Deleted

## 2015-07-06 ENCOUNTER — Ambulatory Visit (HOSPITAL_COMMUNITY)
Admission: RE | Admit: 2015-07-06 | Discharge: 2015-07-06 | Disposition: A | Discharge status: Home / Self Care | Payer: BLUE CROSS/BLUE SHIELD | Source: Ambulatory Visit | Attending: Obstetrics and Gynecology | Admitting: Obstetrics and Gynecology

## 2015-07-06 ENCOUNTER — Ambulatory Visit (HOSPITAL_COMMUNITY): Payer: Self-pay

## 2015-07-06 ENCOUNTER — Encounter (HOSPITAL_COMMUNITY): Payer: Self-pay

## 2015-07-06 VITALS — BP 136/80 | HR 91 | Wt 168.2 lb

## 2015-07-06 DIAGNOSIS — N19 Unspecified kidney failure: Secondary | ICD-10-CM

## 2015-07-06 DIAGNOSIS — Z36 Encounter for antenatal screening of mother: Secondary | ICD-10-CM | POA: Diagnosis not present

## 2015-07-06 DIAGNOSIS — Z3A12 12 weeks gestation of pregnancy: Secondary | ICD-10-CM | POA: Diagnosis not present

## 2015-07-06 DIAGNOSIS — O24011 Pre-existing diabetes mellitus, type 1, in pregnancy, first trimester: Secondary | ICD-10-CM | POA: Insufficient documentation

## 2015-07-06 DIAGNOSIS — O0991 Supervision of high risk pregnancy, unspecified, first trimester: Secondary | ICD-10-CM | POA: Diagnosis present

## 2015-07-06 DIAGNOSIS — Z3682 Encounter for antenatal screening for nuchal translucency: Secondary | ICD-10-CM

## 2015-07-06 DIAGNOSIS — Z3A13 13 weeks gestation of pregnancy: Secondary | ICD-10-CM

## 2015-07-06 DIAGNOSIS — O24911 Unspecified diabetes mellitus in pregnancy, first trimester: Secondary | ICD-10-CM

## 2015-07-06 DIAGNOSIS — O24019 Pre-existing diabetes mellitus, type 1, in pregnancy, unspecified trimester: Secondary | ICD-10-CM

## 2015-07-06 DIAGNOSIS — O0990 Supervision of high risk pregnancy, unspecified, unspecified trimester: Secondary | ICD-10-CM

## 2015-07-12 ENCOUNTER — Encounter (HOSPITAL_COMMUNITY): Payer: Self-pay | Admitting: *Deleted

## 2015-07-12 ENCOUNTER — Inpatient Hospital Stay (HOSPITAL_COMMUNITY): Payer: BLUE CROSS/BLUE SHIELD

## 2015-07-12 ENCOUNTER — Inpatient Hospital Stay (HOSPITAL_COMMUNITY)
Admission: AD | Admit: 2015-07-12 | Discharge: 2015-07-12 | Disposition: A | Discharge status: Home / Self Care | Payer: BLUE CROSS/BLUE SHIELD | Source: Ambulatory Visit | Attending: Obstetrics & Gynecology | Admitting: Obstetrics & Gynecology

## 2015-07-12 DIAGNOSIS — Z794 Long term (current) use of insulin: Secondary | ICD-10-CM | POA: Insufficient documentation

## 2015-07-12 DIAGNOSIS — Z7982 Long term (current) use of aspirin: Secondary | ICD-10-CM | POA: Diagnosis not present

## 2015-07-12 DIAGNOSIS — D649 Anemia, unspecified: Secondary | ICD-10-CM | POA: Diagnosis not present

## 2015-07-12 DIAGNOSIS — O468X1 Other antepartum hemorrhage, first trimester: Secondary | ICD-10-CM

## 2015-07-12 DIAGNOSIS — E109 Type 1 diabetes mellitus without complications: Secondary | ICD-10-CM | POA: Diagnosis not present

## 2015-07-12 DIAGNOSIS — O0991 Supervision of high risk pregnancy, unspecified, first trimester: Secondary | ICD-10-CM

## 2015-07-12 DIAGNOSIS — O209 Hemorrhage in early pregnancy, unspecified: Secondary | ICD-10-CM

## 2015-07-12 DIAGNOSIS — O99011 Anemia complicating pregnancy, first trimester: Secondary | ICD-10-CM | POA: Insufficient documentation

## 2015-07-12 DIAGNOSIS — O4691 Antepartum hemorrhage, unspecified, first trimester: Secondary | ICD-10-CM

## 2015-07-12 DIAGNOSIS — O24011 Pre-existing diabetes mellitus, type 1, in pregnancy, first trimester: Secondary | ICD-10-CM | POA: Insufficient documentation

## 2015-07-12 DIAGNOSIS — O10011 Pre-existing essential hypertension complicating pregnancy, first trimester: Secondary | ICD-10-CM | POA: Diagnosis not present

## 2015-07-12 DIAGNOSIS — O418X10 Other specified disorders of amniotic fluid and membranes, first trimester, not applicable or unspecified: Secondary | ICD-10-CM

## 2015-07-12 DIAGNOSIS — Z3A13 13 weeks gestation of pregnancy: Secondary | ICD-10-CM | POA: Diagnosis not present

## 2015-07-12 HISTORY — DX: Essential (primary) hypertension: I10

## 2015-07-12 LAB — COMPREHENSIVE METABOLIC PANEL
ALT: 17 U/L (ref 14–54)
ANION GAP: 6 (ref 5–15)
AST: 17 U/L (ref 15–41)
Albumin: 1.9 g/dL — ABNORMAL LOW (ref 3.5–5.0)
Alkaline Phosphatase: 38 U/L (ref 38–126)
BILIRUBIN TOTAL: 0.5 mg/dL (ref 0.3–1.2)
BUN: 19 mg/dL (ref 6–20)
CALCIUM: 8.3 mg/dL — AB (ref 8.9–10.3)
CO2: 22 mmol/L (ref 22–32)
CREATININE: 1.6 mg/dL — AB (ref 0.44–1.00)
Chloride: 110 mmol/L (ref 101–111)
GFR, EST AFRICAN AMERICAN: 50 mL/min — AB (ref >60)
GFR, EST NON AFRICAN AMERICAN: 43 mL/min — AB (ref >60)
GLUCOSE: 146 mg/dL — AB (ref 65–99)
POTASSIUM: 4.4 mmol/L (ref 3.5–5.1)
Sodium: 138 mmol/L (ref 135–145)
Total Protein: 5.4 g/dL — ABNORMAL LOW (ref 6.5–8.1)

## 2015-07-12 LAB — URINE MICROSCOPIC-ADD ON: WBC UA: NONE SEEN WBC/hpf (ref 0–5)

## 2015-07-12 LAB — URINALYSIS, ROUTINE W REFLEX MICROSCOPIC
BILIRUBIN URINE: NEGATIVE
GLUCOSE, UA: 250 mg/dL — AB
Ketones, ur: NEGATIVE mg/dL
Leukocytes, UA: NEGATIVE
Nitrite: NEGATIVE
PH: 7 (ref 5.0–8.0)
Protein, ur: >300 mg/dL — AB
SPECIFIC GRAVITY, URINE: 1.015 (ref 1.005–1.030)

## 2015-07-12 LAB — GLUCOSE, CAPILLARY: Glucose-Capillary: 147 mg/dL — ABNORMAL HIGH (ref 65–99)

## 2015-07-12 LAB — CBC
HEMATOCRIT: 18.1 % — AB (ref 36.0–46.0)
Hemoglobin: 6.5 g/dL — CL (ref 12.0–15.0)
MCH: 30.1 pg (ref 26.0–34.0)
MCHC: 35.9 g/dL (ref 30.0–36.0)
MCV: 83.8 fL (ref 78.0–100.0)
Platelets: 276 10*3/uL (ref 150–400)
RBC: 2.16 MIL/uL — ABNORMAL LOW (ref 3.87–5.11)
RDW: 14.3 % (ref 11.5–15.5)
WBC: 9.4 10*3/uL (ref 4.0–10.5)

## 2015-07-12 NOTE — MAU Note (Addendum)
C/o vaginal bleeding that started this AM; moderate, bright red bleeding; c/o high blood sugar; pt is type I diabetic; fasting sugar was 300 this Am -then pt took her insulin; [facility],pregnancy; elevated blood pressure;

## 2015-07-12 NOTE — MAU Provider Note (Signed)
History     CSN: [id number]  Arrival date and time: 07/12/15 [facility]   None      Chief Complaint  Patient presents with  . Vaginal Bleeding   HPI  Christine Reed is a 30 y.o. G1P0 at [redacted]w[redacted]d who presents with vaginal bleeding. PMH significant for type 1 DM & possible CKD. Pt was previously admitted to [facility] for BS control & nephrology consult 2 weeks ago. Was transferred to high risk clinic from [facility] ob but missed her first appt. Has appt with [place] 6/5.  Pt states she vomited this morning and immediately passed a large blood clot. Pt continued to bleed after that & reports bleeding through her clothes. States bleeding has slowed down since arriving to MAU. Reports some lower abdominal cramping.  Fasting BS was 300 this morning. States this is elevated from her norm. Took insulin PTA. States hasn't eaten since last night.   OB History    Gravida Para Term Preterm AB TAB SAB Ectopic Multiple Living   1               Past Medical History  Diagnosis Date  . Diabetes mellitus     diagnosed at age 87  . Hypertension     Past Surgical History  Procedure Laterality Date  . Tonsillectomy and adenoidectomy    . Lipo suction  2015    Family History  Problem Relation Age of Onset  . Cancer Maternal Grandmother     colon and breast  . Diabetes Maternal Grandfather   . Diabetes Mother   . Diabetes Father   . Diabetes Paternal Grandmother   . Diabetes Paternal Grandfather     Social History  Substance Use Topics  . Smoking status: Never Smoker   . Smokeless tobacco: Never Used  . Alcohol Use: No    Allergies:  Allergies  Allergen Reactions  . Iodine Rash    Oral and IV  . Nickel Rash    Prescriptions prior to admission  Medication Sig Dispense Refill Last Dose  . aspirin 81 MG chewable tablet Chew 1 tablet (81 mg total) by mouth once. 90 tablet 4 07/12/2015 at Unknown time  . enoxaparin (LOVENOX) 40 MG/0.4ML injection Inject 0.4 mLs (40 mg total) into the skin  daily. 30 Syringe 11 07/11/2015 at Unknown time  . famotidine (PEPCID) 20 MG tablet Take 1 tablet (20 mg total) by mouth 2 (two) times daily. 60 tablet 11 07/12/2015 at Unknown time  . insulin aspart (NOVOLOG) 100 UNIT/ML injection Inject 8 Units into the skin 2 (two) times daily before a meal.   07/12/2015 at Unknown time  . insulin lispro (HUMALOG KWIKPEN) 100 UNIT/ML KiwkPen Inject 2-11 units into skin 3 (three) times daily with meals 15 mL 11 07/12/2015 at Unknown time  . insulin NPH Human (HUMULIN N) 100 UNIT/ML injection 8 units into skin 2 (two) times a day at 8 am and 10pm 10 mL 11 07/12/2015 at Unknown time  . insulin NPH Human (HUMULIN N,NOVOLIN N) 100 UNIT/ML injection Inject 0.08 mLs (8 Units total) into the skin 2 (two) times daily at 8 am and 10 pm. 10 mL 11 07/12/2015 at Unknown time  . Insulin NPH, Human,, Isophane, (HUMULIN N KWIKPEN) 100 UNIT/ML Kiwkpen Inject 8 units into skin 2 (two) times a day at 8 am and 10 pm 15 mL 11 07/12/2015 at Unknown time  . Prenatal Vit-Fe Fumarate-FA (MULTIVITAMIN-PRENATAL) 27-0.8 MG TABS tablet Take 1 tablet by mouth daily at  12 noon.   07/11/2015 at Unknown time  . insulin aspart (NOVOLOG) 100 UNIT/ML injection Inject 0-6 Units into the skin 3 (three) times daily with meals. (Patient not taking: Reported on 07/12/2015) 10 mL 11 Not Taking at Unknown time  . insulin aspart (NOVOLOG) 100 UNIT/ML injection Inject 2-11 Units into the skin 3 (three) times daily with meals. (Patient not taking: Reported on 07/12/2015) 10 mL 11 Not Taking at Unknown time  . insulin lispro (HUMALOG) 100 UNIT/ML injection Inject 0.02-0.11 mLs (2-11 Units total) into the skin 3 (three) times daily before meals. (Patient not taking: Reported on 07/12/2015) 10 mL 11 Not Taking at Unknown time    Review of Systems  Constitutional: Negative.   Respiratory: Negative for shortness of breath.   Cardiovascular: Negative for palpitations.  Gastrointestinal: Positive for nausea and abdominal  pain. Negative for diarrhea and constipation.  Genitourinary: Negative for dysuria.       + vaginal bleeding  Neurological: Negative for headaches.   Physical Exam   Blood pressure 158/94, pulse 90, temperature 99.1 F (37.3 C), temperature source Oral, resp. rate 18, height 5\' 5"  (1.651 m), weight 167 lb (75.751 kg), last menstrual period 04/03/2015.  Physical Exam  Nursing note and vitals reviewed. Constitutional: She is oriented to person, place, and time. She appears well-developed and well-nourished. No distress.  HENT:  Head: Normocephalic and atraumatic.  Eyes: Conjunctivae are normal. Right eye exhibits no discharge. Left eye exhibits no discharge. No scleral icterus.  Neck: Normal range of motion.  Cardiovascular: Normal rate, regular rhythm and normal heart sounds.   No murmur heard. Respiratory: Effort normal and breath sounds normal. No respiratory distress. She has no wheezes.  GI: Soft. Bowel sounds are normal. She exhibits distension. There is no tenderness.  Genitourinary: There is bleeding (small amount of dark red blood) in the vagina.  Cervix closed  Neurological: She is alert and oriented to person, place, and time.  Skin: Skin is warm and dry. She is not diaphoretic.  Psychiatric: She has a normal mood and affect. Her behavior is normal. Judgment and thought content normal.    MAU Course  Procedures Results for orders placed or performed during the hospital encounter of 07/12/15 (from the past 24 hour(s))  Urinalysis, Routine w reflex microscopic (not at [facility])     Status: Abnormal   Collection Time: 07/12/15  8:45 AM  Result Value Ref Range   Color, Urine YELLOW YELLOW   APPearance CLEAR CLEAR   Specific Gravity, Urine 1.015 1.005 - 1.030   pH 7.0 5.0 - 8.0   Glucose, UA 250 (A) NEGATIVE mg/dL   Hgb urine dipstick LARGE (A) NEGATIVE   Bilirubin Urine NEGATIVE NEGATIVE   Ketones, ur NEGATIVE NEGATIVE mg/dL   Protein, ur >300 (A) NEGATIVE mg/dL   Nitrite  NEGATIVE NEGATIVE   Leukocytes, UA NEGATIVE NEGATIVE  Urine microscopic-add on     Status: Abnormal   Collection Time: 07/12/15  8:45 AM  Result Value Ref Range   Squamous Epithelial / LPF 0-5 (A) NONE SEEN   WBC, UA NONE SEEN 0 - 5 WBC/hpf   RBC / HPF 0-5 0 - 5 RBC/hpf   Bacteria, UA RARE (A) NONE SEEN  Glucose, capillary     Status: Abnormal   Collection Time: 07/12/15  9:16 AM  Result Value Ref Range   Glucose-Capillary 147 (H) 65 - 99 mg/dL  CBC     Status: Abnormal   Collection Time: 07/12/15  9:22 AM  Result  Value Ref Range   WBC 9.4 4.0 - 10.5 K/uL   RBC 2.16 (L) 3.87 - 5.11 MIL/uL   Hemoglobin 6.5 (LL) 12.0 - 15.0 g/dL   HCT 18.1 (L) 36.0 - 46.0 %   MCV 83.8 78.0 - 100.0 fL   MCH 30.1 26.0 - 34.0 pg   MCHC 35.9 30.0 - 36.0 g/dL   RDW 14.3 11.5 - 15.5 %   Platelets 276 150 - 400 K/uL  Comprehensive metabolic panel     Status: Abnormal   Collection Time: 07/12/15  9:22 AM  Result Value Ref Range   Sodium 138 135 - 145 mmol/L   Potassium 4.4 3.5 - 5.1 mmol/L   Chloride 110 101 - 111 mmol/L   CO2 22 22 - 32 mmol/L   Glucose, Bld 146 (H) 65 - 99 mg/dL   BUN 19 6 - 20 mg/dL   Creatinine, Ser 1.60 (H) 0.44 - 1.00 mg/dL   Calcium 8.3 (L) 8.9 - 10.3 mg/dL   Total Protein 5.4 (L) 6.5 - 8.1 g/dL   Albumin 1.9 (L) 3.5 - 5.0 g/dL   AST 17 15 - 41 U/L   ALT 17 14 - 54 U/L   Alkaline Phosphatase 38 38 - 126 U/L   Total Bilirubin 0.5 0.3 - 1.2 mg/dL   GFR calc non Af Amer 43 (L) >60 mL/min   GFR calc Af Amer 50 (L) >60 mL/min   Anion gap 6 5 - 15   US Ob Comp Less 14 Wks  07/12/2015  CLINICAL DATA:  Vaginal bleeding since this morning. Early pregnancy. EXAM: OBSTETRIC <14 WK [place] AND TRANSVAGINAL OB US TECHNIQUE: Both transabdominal and transvaginal ultrasound examinations were performed for complete evaluation of the gestation as well as the maternal uterus, adnexal regions, and pelvic cul-de-sac. Transvaginal technique was performed to assess early pregnancy. COMPARISON:   07/06/2015 FINDINGS: Intrauterine gestational sac: Single Yolk sac:  Not visualized Embryo:  Present Cardiac Activity: Present Heart Rate: 159  bpm CRL:  74  mm   13 w   4 d                  [place] EDC: 01/13/2016 Subchorionic hemorrhage: 4.7 x 3.1 x 3.2 cm subchorionic hemorrhage. Maternal uterus/adnexae: Unremarkable appearance of the ovaries. IMPRESSION: Single living IUP as above. Small to moderate size subchorionic hemorrhage. Electronically Signed   By: Logan Bores M.D.   On: 07/12/2015 11:08    MDM O positive CBG 147 FHT 168 by doppler Ultrasound shows small to moderate sized [place] Hgb 6.5 S/w Dr. Roselie Awkward regarding pt & labs. Will come to MAU & speak with patient regarding admission  Assessment and Plan  Vaginal bleeding with subchorionic hemorrhage at [redacted]w[redacted]d. Declines admission for observation at this time Class R DM, type 1, reports fair control but was elevated this AM Anemia of chronic disease, renal insufficiency. Refuses transfusion, will start iron supplement daily. Bleeding precautions given Continue current Lovenox dose per pharmacy recommendation F/U as scheduled at [facility] on 07/17/15 Woodroe Mode, MD 07/12/2015   A: 1. Type 1 diabetes mellitus during pregnancy, antepartum, first trimester   2. Supervision of high risk pregnancy, antepartum, first trimester   3. Vaginal bleeding in pregnancy, first trimester   4. Anemia affecting pregnancy in first trimester   5. Subchorionic hematoma in first trimester     P: Discharge home Pelvic rest Iron rich diet Continue lovenox Iron supplement -- ferrous sulfate 325 TID Discussed reasons to return Keep [facility] appt  on Monday  Jorje Guild 07/12/2015, 10:01 AM

## 2015-07-12 NOTE — Discharge Instructions (Signed)
Take iron supplement -- ferrous sulfate 325 mg, 3 times per day ° °Iron-Rich Diet °Iron is a mineral that helps your body to produce hemoglobin. Hemoglobin is a protein in your red blood cells that carries oxygen to your body's tissues. Eating too little iron may cause you to feel weak and tired, and it can increase your risk for infection. Eating enough iron is necessary for your body's metabolism, muscle function, and nervous system. °Iron is naturally found in many foods. It can also be added to foods or fortified in foods. There are two types of dietary iron: °· Heme iron. Heme iron is absorbed by the body more easily than nonheme iron. Heme iron is found in meat, poultry, and fish. °· Nonheme iron. Nonheme iron is found in dietary supplements, iron-fortified grains, beans, and vegetables. °You may need to follow an iron-rich diet if: °· You have been diagnosed with iron deficiency or iron-deficiency anemia. °· You have a condition that prevents you from absorbing dietary iron, such as: °¨ Infection in your intestines. °¨ Celiac disease. This involves long-lasting (chronic) inflammation of your intestines. °· You do not eat enough iron. °· You eat a diet that is high in foods that impair iron absorption. °· You have lost a lot of blood. °· You have heavy bleeding during your menstrual cycle. °· You are pregnant. °WHAT IS MY PLAN? °Your health care provider may help you to determine how much iron you need per day based on your condition. Generally, when a person consumes sufficient amounts of iron in the diet, the following iron needs are met: °· Men. °¨ 14-18 years old: 11 mg per day. °¨ 19-50 years old: 8 mg per day. °· Women.   °¨ 14-18 years old: 15 mg per day. °¨ 19-50 years old: 18 mg per day. °¨ Over 50 years old: 8 mg per day. °¨ Pregnant women: 27 mg per day. °¨ Breastfeeding women: 9 mg per day. °WHAT DO I NEED TO KNOW ABOUT AN IRON-RICH DIET? °· Eat fresh fruits and vegetables that are high in vitamin  C along with foods that are high in iron. This will help increase the amount of iron that your body absorbs from food, especially with foods containing nonheme iron. Foods that are high in vitamin C include oranges, peppers, tomatoes, and mango. °· Take iron supplements only as directed by your health care provider. Overdose of iron can be life-threatening. If you were prescribed iron supplements, take them with orange juice or a vitamin C supplement. °· Cook foods in pots and pans that are made from iron.   °· Eat nonheme iron-containing foods alongside foods that are high in heme iron. This helps to improve your iron absorption.   °· Certain foods and drinks contain compounds that impair iron absorption. Avoid eating these foods in the same meal as iron-rich foods or with iron supplements. These include: °¨ Coffee, black tea, and red wine. °¨ Milk, dairy products, and foods that are high in calcium. °¨ Beans, soybeans, and peas. °¨ Whole grains. °· When eating foods that contain both nonheme iron and compounds that impair iron absorption, follow these tips to absorb iron better.   °¨ Soak beans overnight before cooking. °¨ Soak whole grains overnight and drain them before using. °¨ Ferment flours before baking, such as using yeast in bread dough. °WHAT FOODS CAN I EAT? °Grains  °Iron-fortified breakfast cereal. Iron-fortified whole-wheat bread. Enriched rice. Sprouted grains. °Vegetables  °Spinach. Potatoes with skin. Green peas. Broccoli. Red and green bell peppers.   Fermented vegetables. Fruits Prunes. Raisins. Oranges. Strawberries. Mango. Grapefruit. Meats and Other Protein Sources Beef liver. Oysters. Beef. Shrimp. [place]. Chicken. [place]. Sardines. Chickpeas. Nuts. Tofu. Beverages Tomato juice. Fresh orange juice. Prune juice. Hibiscus tea. Fortified instant breakfast shakes. Condiments Tahini. Fermented soy sauce. Sweets and Desserts Black-strap molasses.  Other Wheat germ. The items listed  above may not be a complete list of recommended foods or beverages. Contact your dietitian for more options. WHAT FOODS ARE NOT RECOMMENDED? Grains Whole grains. Bran cereal. Bran flour. Oats. Vegetables Artichokes. Brussels sprouts. Kale. Fruits Blueberries. Raspberries. Strawberries. Figs. Meats and Other Protein Sources Soybeans. Products made from soy protein. Dairy Milk. Cream. Cheese. Yogurt. Cottage cheese. Beverages Coffee. Black tea. Red wine. Sweets and Desserts Cocoa. Chocolate. Ice cream. Other Basil. Oregano. Parsley. The items listed above may not be a complete list of foods and beverages to avoid. Contact your dietitian for more information.   This information is not intended to replace advice given to you by your health care provider. Make sure you discuss any questions you have with your health care provider.   Document Released: 09/11/2004 Document Revised: 02/18/2014 Document Reviewed: 08/25/2013 Elsevier Interactive Patient Education 2016 [place] Hematoma A subchorionic hematoma is a gathering of blood between the outer wall of the placenta and the inner wall of the womb (uterus). The placenta is the organ that connects the fetus to the wall of the uterus. The placenta performs the feeding, breathing (oxygen to the fetus), and waste removal (excretory work) of the fetus.  Subchorionic hematoma is the most common abnormality found on a result from ultrasonography done during the first trimester or early second trimester of pregnancy. If there has been little or no vaginal bleeding, early small hematomas usually shrink on their own and do not affect your baby or pregnancy. The blood is gradually absorbed over 1-2 weeks. When bleeding starts later in pregnancy or the hematoma is larger or occurs in an older pregnant woman, the outcome may not be as good. Larger hematomas may get bigger, which increases the chances for miscarriage.  Subchorionic hematoma also increases the risk of premature detachment of the placenta from the uterus, preterm (premature) labor, and stillbirth. HOME CARE INSTRUCTIONS  Stay on bed rest if your health care provider recommends this. Although bed rest will not prevent more bleeding or prevent a miscarriage, your health care provider may recommend bed rest until you are advised otherwise.  Avoid heavy lifting (more than 10 lb [4.5 kg]), exercise, sexual intercourse, or douching as directed by your health care provider.  Keep track of the number of pads you use each day and how soaked (saturated) they are. Write down this information.  Do not use tampons.  Keep all follow-up appointments as directed by your health care provider. Your health care provider may ask you to have follow-up blood tests or ultrasound tests or both. SEEK IMMEDIATE MEDICAL CARE IF:  You have severe cramps in your stomach, back, abdomen, or pelvis.  You have a fever.  You pass large clots or tissue. Save any tissue for your health care provider to look at.  Your bleeding increases or you become lightheaded, feel weak, or have fainting episodes.   This information is not intended to replace advice given to you by your health care provider. Make sure you discuss any questions you have with your health care provider.   Document Released: 05/15/2006 Document Revised: 02/18/2014 Document Reviewed: 08/27/2012 Elsevier Interactive Patient Education 2016 Elsevier  Inc.    Pelvic Rest Pelvic rest is sometimes recommended for women when:   The placenta is partially or completely covering the opening of the cervix (placenta previa).  There is bleeding between the uterine wall and the amniotic sac in the first trimester (subchorionic hemorrhage).  The cervix begins to open without labor starting (incompetent cervix, cervical insufficiency).  The labor is too early (preterm labor). HOME CARE INSTRUCTIONS  Do not have  sexual intercourse, stimulation, or an orgasm.  Do not use tampons, douche, or put anything in the vagina.  Do not lift anything over 10 pounds (4.5 kg).  Avoid strenuous activity or straining your pelvic muscles. SEEK MEDICAL CARE IF:  You have any vaginal bleeding during pregnancy. Treat this as a potential emergency.  You have cramping pain felt low in the stomach (stronger than menstrual cramps).  You notice vaginal discharge (watery, mucus, or bloody).  You have a low, dull backache.  There are regular contractions or uterine tightening. SEEK IMMEDIATE MEDICAL CARE IF: You have vaginal bleeding and have placenta previa.    This information is not intended to replace advice given to you by your health care provider. Make sure you discuss any questions you have with your health care provider.   Document Released: 05/25/2010 Document Revised: 04/22/2011 Document Reviewed: 08/01/2014 Elsevier Interactive Patient Education [employer].

## 2015-07-12 NOTE — MAU Note (Signed)
Critical value of Hemoglobin 6.5 reported by lab to RN and then to MAU provider;

## 2015-07-13 ENCOUNTER — Other Ambulatory Visit (HOSPITAL_COMMUNITY): Payer: Self-pay

## 2015-07-17 ENCOUNTER — Encounter: Payer: Self-pay | Admitting: Obstetrics and Gynecology

## 2015-07-17 ENCOUNTER — Encounter: Payer: Self-pay | Admitting: Family Medicine

## 2015-07-17 ENCOUNTER — Ambulatory Visit (INDEPENDENT_AMBULATORY_CARE_PROVIDER_SITE_OTHER): Payer: BLUE CROSS/BLUE SHIELD | Admitting: Obstetrics and Gynecology

## 2015-07-17 ENCOUNTER — Encounter: Payer: BLUE CROSS/BLUE SHIELD | Attending: Family Medicine | Admitting: Dietician

## 2015-07-17 VITALS — BP 133/85 | HR 95 | Wt 166.0 lb

## 2015-07-17 DIAGNOSIS — O24919 Unspecified diabetes mellitus in pregnancy, unspecified trimester: Secondary | ICD-10-CM

## 2015-07-17 DIAGNOSIS — E109 Type 1 diabetes mellitus without complications: Secondary | ICD-10-CM

## 2015-07-17 DIAGNOSIS — D638 Anemia in other chronic diseases classified elsewhere: Secondary | ICD-10-CM | POA: Diagnosis present

## 2015-07-17 DIAGNOSIS — O24012 Pre-existing diabetes mellitus, type 1, in pregnancy, second trimester: Secondary | ICD-10-CM

## 2015-07-17 DIAGNOSIS — O468X1 Other antepartum hemorrhage, first trimester: Secondary | ICD-10-CM

## 2015-07-17 DIAGNOSIS — O468X9 Other antepartum hemorrhage, unspecified trimester: Secondary | ICD-10-CM

## 2015-07-17 DIAGNOSIS — O418X90 Other specified disorders of amniotic fluid and membranes, unspecified trimester, not applicable or unspecified: Secondary | ICD-10-CM | POA: Insufficient documentation

## 2015-07-17 LAB — POCT URINALYSIS DIP (DEVICE)
Bilirubin Urine: NEGATIVE
GLUCOSE, UA: NEGATIVE mg/dL
Ketones, ur: NEGATIVE mg/dL
Leukocytes, UA: NEGATIVE
Nitrite: NEGATIVE
Specific Gravity, Urine: 1.02 (ref 1.005–1.030)
UROBILINOGEN UA: 0.2 mg/dL (ref 0.0–1.0)
pH: 5.5 (ref 5.0–8.0)

## 2015-07-17 LAB — CBC
HCT: 18.6 % — ABNORMAL LOW (ref 35.0–45.0)
Hemoglobin: 6.1 g/dL — CL (ref 11.7–15.5)
MCH: 29.5 pg (ref 27.0–33.0)
MCHC: 32.8 g/dL (ref 32.0–36.0)
MCV: 89.9 fL (ref 80.0–100.0)
MPV: 9 fL (ref 7.5–12.5)
PLATELETS: 291 10*3/uL (ref 140–400)
RBC: 2.07 MIL/uL — ABNORMAL LOW (ref 3.80–5.10)
RDW: 15.2 % — AB (ref 11.0–15.0)
WBC: 7.3 10*3/uL (ref 3.8–10.8)

## 2015-07-17 LAB — HEMOGLOBIN A1C
HEMOGLOBIN A1C: 6.2 % — AB (ref <5.7)
Mean Plasma Glucose: 131 mg/dL

## 2015-07-17 LAB — TSH: TSH: 3.07 m[IU]/L

## 2015-07-17 NOTE — Progress Notes (Signed)
Patient ID: Christine Reed, female   DOB: 1985/05/06, 30 y.o.   MRN: [id number]   Called by lab for a critical value.   Lab Results  Component Value Date   HGB 6.1* 07/17/2015   HGB 6.5* 07/12/2015   HGB 7.1* 06/24/2015    Anemia is longstanding and chronic. She will likely need feraheme in addition to possible other modalities to improve hgb.   Caren Macadam, MD

## 2015-07-17 NOTE — Progress Notes (Signed)
U/S scheduled 07/05 @ 815 am. Fetal echo scheduled with Dr Filbert Schilder 07/27 @ [place] appt scheduled with [place] 06/26 @10am . Endocrine appt scheduled with [facility] Endocrinology 07/05 @ 11am.

## 2015-07-17 NOTE — Progress Notes (Signed)
Diabetes Educator 07/17/15 Hx of type 1 DM for 17 years.  Has seen Dr. Chalmers Cater in the past.  Currently not consistently seeing endocrine MD.  Diagnosed as [redacted] weeks pregnant.  Currently, not consistently checking blood glucose.  Has the [employer] Prime meter and has 3 readings.  Last evening a 135 mg. On 6/5 at 5:am was 85 and 7:00am 90 mg/dl. Ha Insulin to Carb/Glucose scale as prescribed by Dr. Chalmers Cater in the past: NPH is 8 units in AM and 8 units in PM with Humalog quick pen pre-meals using carb count and sliding scale 10 carbs= 1 unit of Humalog Sliding scale: 100-150 = 2 units, 151-200 = 4 units, 201-250= 6 units, 251- 300 = 8 units, 301-350 = 11 units. Currently is posted to receive appointments for endocrinologist, eye exam.                                              Currently c/o "some vision issues" upon questioning.   Review of the changes of blood glucose with pregnancy.   Review of the glucose testing times and the glucose goals of 60-90 mg/dl fasting and 2 hr post meal of <120 mg/dl. Instructed to bring meter and blood glucose log to all clinic appointments. Review of need for regular walking/exercise for glucose control. Brief review of carbs, portion size, carb counting. Provided dietary guidelines and possible menu for carb restriction. Instructed to omit concentrated. Cooperative and demonstrates a willingness to work at controlling blood glucose. To bring her book to clinic Monday, June 12, and I will consult with MD regarding changes in Insulin. Maggie Ethyl Vila RN, RD, LDN

## 2015-07-17 NOTE — Progress Notes (Signed)
Subjective:  Christine Reed is a 30 y.o. G1P0 at [redacted]w[redacted]d being seen today for initial prenatal care.  She is currently monitored for the following issues for this high-risk pregnancy and has Diabetes mellitus, insulin dependent (IDDM), uncontrolled ([place]); Type 1 diabetes mellitus during pregnancy, antepartum; Supervision of high risk pregnancy, antepartum; and Uncontrolled diabetes mellitus ([place]) on her problem list.  Patient reports vaginal discharge. Says has chronic bv. After most recent dx didn't get treated..  Contractions: Not present. Vag. Bleeding: None.   . Denies leaking of fluid.   Takes NPH 8 units bid and humalog pen sliding scale. Checks glucose fasting in morning, before and 2 hours PP, and nighttime. And as needed  The following portions of the patient's history were reviewed and updated as appropriate: allergies, current medications, past family history, past medical history, past social history, past surgical history and problem list. Problem list updated.  Objective:   Filed Vitals:   07/17/15 0829  BP: 133/85  Pulse: 95  Weight: 166 lb (75.297 kg)    Fetal Status:           General:  Alert, oriented and cooperative. Patient is in no acute distress.  Skin: Skin is warm and dry. No rash noted.   Cardiovascular: Normal heart rate noted  Respiratory: Normal respiratory effort, no problems with respiration noted  Abdomen: Soft, gravid, appropriate for gestational age. Pain/Pressure: Present     Pelvic: Vag. Bleeding: None Vag D/C Character: White   Cervical exam deferred        Extremities: Normal range of motion.  Edema: Mild pitting, slight indentation  Mental Status: Normal mood and affect. Normal behavior. Normal judgment and thought content.   Urinalysis: Urine Protein: 3+ Urine Glucose: Negative  Assessment and Plan:  Pregnancy: G1P0 at [redacted]w[redacted]d  # Type R DM - long discussion w/ patient of risks - start writing down glucose values - dm educator today. Neither i  nor dm educator can access glucose recordings from this reader. Question whether patient is checking as asked. Will start recording in log book and return in 1 week w/ dm educator - anatomy scan and fetal echo - endocrine consult ordered - ophtho consult ordered - continue aspirin and lovenox - a1c, tsh, and tpo antibodies  # Anemia - of chronic disease - nephrology consult - repeat CBC, if less than 7 will recommend transfusion  # discharge - wet prep  F/u dm educator 1 week, hrob 2 wks  Preterm labor symptoms and general obstetric precautions including but not limited to vaginal bleeding, contractions, leaking of fluid and fetal movement were reviewed in detail with the patient. Please refer to After Visit Summary for other counseling recommendations.    Gwynne Edinger, MD

## 2015-07-17 NOTE — Progress Notes (Signed)
C/o of vaginal d/c w/ odor Urine: moderate blood

## 2015-07-18 LAB — THYROID PEROXIDASE ANTIBODY: THYROID PEROXIDASE ANTIBODY: 3 [IU]/mL (ref <9)

## 2015-07-18 LAB — WET PREP, GENITAL
Trich, Wet Prep: NONE SEEN
YEAST WET PREP: NONE SEEN

## 2015-07-19 ENCOUNTER — Telehealth: Payer: Self-pay | Admitting: General Practice

## 2015-07-19 DIAGNOSIS — B9689 Other specified bacterial agents as the cause of diseases classified elsewhere: Secondary | ICD-10-CM

## 2015-07-19 DIAGNOSIS — N760 Acute vaginitis: Principal | ICD-10-CM

## 2015-07-19 MED ORDER — METRONIDAZOLE 500 MG PO TABS: 500.0000 mg | ORAL_TABLET | Freq: Two times a day (BID) | ORAL | Status: DC

## 2015-07-19 NOTE — Telephone Encounter (Signed)
Per Dr Si Raider, patient has severe anemia and needs to come to hospital asap to be admitted for blood transfusion. Called patient and informed her of results & recommendations. Patient verbalized understanding & states she cannot come till Saturday because she cannot miss anymore work. Discussed importance of coming in asap. Patient verbalized understanding & states she cannot come any sooner but will come first thing Saturday morning. Patient asked about wet prep results. Informed patient of results & that we would send antibiotic to pharmacy. Patient verbalized understanding & had no questions

## 2015-07-20 LAB — HM DIABETES EYE EXAM

## 2015-07-22 ENCOUNTER — Inpatient Hospital Stay (HOSPITAL_COMMUNITY)
Admission: AD | Admit: 2015-07-22 | Discharge: 2015-07-22 | Disposition: A | Discharge status: Home / Self Care | Payer: BLUE CROSS/BLUE SHIELD | Source: Ambulatory Visit | Attending: Family Medicine | Admitting: Family Medicine

## 2015-07-22 ENCOUNTER — Encounter (HOSPITAL_COMMUNITY): Payer: Self-pay

## 2015-07-22 DIAGNOSIS — Z3A15 15 weeks gestation of pregnancy: Secondary | ICD-10-CM | POA: Diagnosis not present

## 2015-07-22 DIAGNOSIS — O162 Unspecified maternal hypertension, second trimester: Secondary | ICD-10-CM | POA: Insufficient documentation

## 2015-07-22 DIAGNOSIS — D509 Iron deficiency anemia, unspecified: Secondary | ICD-10-CM | POA: Diagnosis not present

## 2015-07-22 DIAGNOSIS — O99012 Anemia complicating pregnancy, second trimester: Secondary | ICD-10-CM

## 2015-07-22 DIAGNOSIS — E119 Type 2 diabetes mellitus without complications: Secondary | ICD-10-CM | POA: Diagnosis not present

## 2015-07-22 DIAGNOSIS — Z794 Long term (current) use of insulin: Secondary | ICD-10-CM | POA: Insufficient documentation

## 2015-07-22 DIAGNOSIS — D638 Anemia in other chronic diseases classified elsewhere: Secondary | ICD-10-CM

## 2015-07-22 DIAGNOSIS — Z7982 Long term (current) use of aspirin: Secondary | ICD-10-CM | POA: Insufficient documentation

## 2015-07-22 DIAGNOSIS — O10919 Unspecified pre-existing hypertension complicating pregnancy, unspecified trimester: Secondary | ICD-10-CM | POA: Diagnosis present

## 2015-07-22 DIAGNOSIS — O10912 Unspecified pre-existing hypertension complicating pregnancy, second trimester: Secondary | ICD-10-CM

## 2015-07-22 DIAGNOSIS — O24312 Unspecified pre-existing diabetes mellitus in pregnancy, second trimester: Secondary | ICD-10-CM | POA: Insufficient documentation

## 2015-07-22 LAB — RETICULOCYTES
RBC.: 2.04 MIL/uL — AB (ref 3.87–5.11)
RETIC CT PCT: 3.3 % — AB (ref 0.4–3.1)
Retic Count, Absolute: 67.3 10*3/uL (ref 19.0–186.0)

## 2015-07-22 LAB — GLUCOSE, CAPILLARY: Glucose-Capillary: 102 mg/dL — ABNORMAL HIGH (ref 65–99)

## 2015-07-22 LAB — VITAMIN B12: VITAMIN B 12: 376 pg/mL (ref 180–914)

## 2015-07-22 LAB — IRON AND TIBC
Iron: 68 ug/dL (ref 28–170)
SATURATION RATIOS: 22 % (ref 10.4–31.8)
TIBC: 309 ug/dL (ref 250–450)
UIBC: 241 ug/dL

## 2015-07-22 LAB — FERRITIN: FERRITIN: 56 ng/mL (ref 11–307)

## 2015-07-22 LAB — FOLATE: Folate: 22.2 ng/mL (ref >5.9)

## 2015-07-22 MED ORDER — SODIUM CHLORIDE 0.9 % IV SOLN
510.0000 mg | Freq: Once | INTRAVENOUS | Status: AC
  Administered 2015-07-22: 510 mg via INTRAVENOUS
  Filled 2015-07-22: qty 17

## 2015-07-22 MED ORDER — SODIUM CHLORIDE 0.9 % IV SOLN
INTRAVENOUS | Status: DC
  Administered 2015-07-22: 11:00:00 via INTRAVENOUS

## 2015-07-22 NOTE — MAU Note (Signed)
Patient here for possible blood transfusion. 

## 2015-07-22 NOTE — MAU Provider Note (Signed)
History     CSN: [id number]  Arrival date and time: 07/22/15 [id number]   First Provider Initiated Contact with Patient 07/22/15 1032      No chief complaint on file.  HPI   [name] is 30 y.o. female G1P0 @ [redacted]w[redacted]d with a history of DM presenting here for an iron transfusion. The patient has severe anemia and was instructed to come here for evaluation.  Patient taking iron TID with each meal.  She denies dizziness, shortness of breath, or chest pain.   OB History    Gravida Para Term Preterm AB TAB SAB Ectopic Multiple Living   1               Past Medical History  Diagnosis Date  . Diabetes mellitus     diagnosed at age 43  . Hypertension     Past Surgical History  Procedure Laterality Date  . Tonsillectomy and adenoidectomy    . Lipo suction  2015  . Tonsillectomy      Family History  Problem Relation Age of Onset  . Cancer Maternal Grandmother     colon and breast  . Diabetes Maternal Grandfather   . Diabetes Mother   . Diabetes Father   . Diabetes Paternal Grandmother   . Diabetes Paternal Grandfather     Social History  Substance Use Topics  . Smoking status: Never Smoker   . Smokeless tobacco: Never Used  . Alcohol Use: No    Allergies:  Allergies  Allergen Reactions  . Iodine Rash    Oral and IV  . Nickel Rash    Prescriptions prior to admission  Medication Sig Dispense Refill Last Dose  . aspirin 81 MG chewable tablet Chew 1 tablet (81 mg total) by mouth once. 90 tablet 4 07/22/2015 at Unknown time  . enoxaparin (LOVENOX) 40 MG/0.4ML injection Inject 0.4 mLs (40 mg total) into the skin daily. 30 Syringe 11 07/21/2015 at Unknown time  . famotidine (PEPCID) 20 MG tablet Take 1 tablet (20 mg total) by mouth 2 (two) times daily. 60 tablet 11 07/22/2015 at Unknown time  . insulin aspart (NOVOLOG) 100 UNIT/ML injection Inject 8 Units into the skin 2 (two) times daily before a meal.   07/22/2015 at Unknown time  . insulin lispro (HUMALOG KWIKPEN) 100  UNIT/ML KiwkPen Inject 2-11 units into skin 3 (three) times daily with meals 15 mL 11 07/22/2015 at Unknown time  . insulin NPH Human (HUMULIN N,NOVOLIN N) 100 UNIT/ML injection Inject 0.08 mLs (8 Units total) into the skin 2 (two) times daily at 8 am and 10 pm. 10 mL 11 07/22/2015 at Unknown time  . metroNIDAZOLE (FLAGYL) 500 MG tablet Take 1 tablet (500 mg total) by mouth 2 (two) times daily. 14 tablet 0 07/22/2015 at Unknown time  . Prenatal Vit-Fe Fumarate-FA (MULTIVITAMIN-PRENATAL) 27-0.8 MG TABS tablet Take 1 tablet by mouth daily at 12 noon.   07/22/2015 at Unknown time   Results for orders placed or performed during the hospital encounter of 07/22/15 (from the past 48 hour(s))  Vitamin B12     Status: None   Collection Time: 07/22/15 10:51 AM  Result Value Ref Range   Vitamin B-12 376 180 - 914 pg/mL    Comment: (NOTE) This assay is not validated for testing neonatal or myeloproliferative syndrome specimens for Vitamin B12 levels. Performed at [facility]   Iron and TIBC     Status: None   Collection Time: 07/22/15 10:51 AM  Result  Value Ref Range   Iron 68 28 - 170 ug/dL   TIBC 309 250 - 450 ug/dL   Saturation Ratios 22 10.4 - 31.8 %   UIBC 241 ug/dL    Comment: Performed at [facility]  Ferritin     Status: None   Collection Time: 07/22/15 10:51 AM  Result Value Ref Range   Ferritin 56 11 - 307 ng/mL    Comment: Performed at [facility]  Reticulocytes     Status: Abnormal   Collection Time: 07/22/15 10:52 AM  Result Value Ref Range   Retic Ct Pct 3.3 (H) 0.4 - 3.1 %   RBC. 2.04 (L) 3.87 - 5.11 MIL/uL   Retic Count, Manual 67.3 19.0 - 186.0 K/uL  Glucose, capillary     Status: Abnormal   Collection Time: 07/22/15 11:27 AM  Result Value Ref Range   Glucose-Capillary 102 (H) 65 - 99 mg/dL    Review of Systems  Constitutional: Positive for malaise/fatigue.  Respiratory: Negative for shortness of breath.   Cardiovascular: Negative for chest pain.   Gastrointestinal: Negative for abdominal pain.  Neurological: Negative for dizziness and weakness.   Physical Exam   Blood pressure 140/90, pulse 88, temperature 98.1 F (36.7 C), temperature source Oral, resp. rate 16, weight 170 lb (77.111 kg), last menstrual period 04/03/2015, SpO2 100 %.  Physical Exam  Constitutional: She is oriented to person, place, and time. She appears well-developed and well-nourished. No distress.  Cardiovascular: Normal rate and normal heart sounds.   Musculoskeletal: Normal range of motion.  Neurological: She is alert and oriented to person, place, and time.  Skin: She is not diaphoretic. There is pallor.    MAU Course  Procedures  None  MDM  Orthostatic vitals normal NS bolus Feraheme infusion 510 mg IV Pulse ox 100% on RA.   Anemia panel ordered and pending.  BP elevated in office and today in MAU:  5/31: 158/94 6/10: 131/91 6/10: 140/90  Assessment and Plan    A:   1. Anemia, chronic disease   2. Chronic hypertension in pregnancy, second trimester             P:  Discharge home in stable condition Continue ASA daily  Return to MAU with any worsening symptoms/ dizziness  Patient to return next Saturday for iron infusion; patient unable to go to the [facility] during the Week due to work schedule.  Follow up [facility] as scheduled    Lezlie Lye, NP 07/22/2015 5:20 PM

## 2015-07-22 NOTE — Discharge Instructions (Signed)
Anemia, Nonspecific Anemia is a condition in which the concentration of red blood cells or hemoglobin in the blood is below normal. Hemoglobin is a substance in red blood cells that carries oxygen to the tissues of the body. Anemia results in not enough oxygen reaching these tissues.  CAUSES  Common causes of anemia include:   Excessive bleeding. Bleeding may be internal or external. This includes excessive bleeding from periods (in women) or from the intestine.   Poor nutrition.   Chronic kidney, thyroid, and liver disease.  Bone marrow disorders that decrease red blood cell production.  Cancer and treatments for cancer.  HIV, AIDS, and their treatments.  Spleen problems that increase red blood cell destruction.  Blood disorders.  Excess destruction of red blood cells due to infection, medicines, and autoimmune disorders. SIGNS AND SYMPTOMS   Minor weakness.   Dizziness.   Headache.  Palpitations.   Shortness of breath, especially with exercise.   Paleness.  Cold sensitivity.  Indigestion.  Nausea.  Difficulty sleeping.  Difficulty concentrating. Symptoms may occur suddenly or they may develop slowly.  DIAGNOSIS  Additional blood tests are often needed. These help your health care provider determine the best treatment. Your health care provider will check your stool for blood and look for other causes of blood loss.  TREATMENT  Treatment varies depending on the cause of the anemia. Treatment can include:   Supplements of iron, vitamin B12, or folic acid.   Hormone medicines.   A blood transfusion. This may be needed if blood loss is severe.   Hospitalization. This may be needed if there is significant continual blood loss.   Dietary changes.  Spleen removal. HOME CARE INSTRUCTIONS Keep all follow-up appointments. It often takes many weeks to correct anemia, and having your health care provider check on your condition and your response to  treatment is very important. SEEK IMMEDIATE MEDICAL CARE IF:   You develop extreme weakness, shortness of breath, or chest pain.   You become dizzy or have trouble concentrating.  You develop heavy vaginal bleeding.   You develop a rash.   You have bloody or black, tarry stools.   You faint.   You vomit up blood.   You vomit repeatedly.   You have abdominal pain.  You have a fever or persistent symptoms for more than 2-3 days.   You have a fever and your symptoms suddenly get worse.   You are dehydrated.  MAKE SURE YOU:  Understand these instructions.  Will watch your condition.  Will get help right away if you are not doing well or get worse.   This information is not intended to replace advice given to you by your health care provider. Make sure you discuss any questions you have with your health care provider.   Document Released: 03/07/2004 Document Revised: 09/30/2012 Document Reviewed: 07/24/2012 Elsevier Interactive Patient Education 2016 [employer]  

## 2015-07-24 ENCOUNTER — Encounter: Payer: Self-pay | Admitting: Physician Assistant

## 2015-07-24 ENCOUNTER — Ambulatory Visit: Payer: BLUE CROSS/BLUE SHIELD

## 2015-07-24 DIAGNOSIS — E11319 Type 2 diabetes mellitus with unspecified diabetic retinopathy without macular edema: Secondary | ICD-10-CM | POA: Insufficient documentation

## 2015-07-25 ENCOUNTER — Telehealth: Payer: Self-pay

## 2015-07-25 NOTE — Telephone Encounter (Signed)
Per Dr. Nehemiah Settle, pt missed appt with [facility] and needs to bring in BS log book, pt needs to remain at office until provider reviews log due to pt may need insulin dosage change.  Called pt and LM to return call to the Clinics.

## 2015-07-25 NOTE — Telephone Encounter (Signed)
Pt return call and pt informed me that she has rescheduled her Diabetes Education appt for next Monday.  I advised pt to please bring her sugar log just in cause her insulin does not need to be changed before Monday.  Pt stated that she will try to bring in tomorrow before noon.  Notified front desk that pt may come before noon.

## 2015-07-28 MED FILL — FAMOTIDINE 20 MG TABLET: 20 | 30 days supply | Qty: 60 | Fill #1

## 2015-07-29 ENCOUNTER — Inpatient Hospital Stay (HOSPITAL_COMMUNITY)
Admission: AD | Admit: 2015-07-29 | Discharge: 2015-07-29 | Disposition: A | Discharge status: Home / Self Care | Payer: BLUE CROSS/BLUE SHIELD | Source: Ambulatory Visit | Attending: Obstetrics and Gynecology | Admitting: Obstetrics and Gynecology

## 2015-07-29 DIAGNOSIS — E109 Type 1 diabetes mellitus without complications: Secondary | ICD-10-CM | POA: Diagnosis not present

## 2015-07-29 DIAGNOSIS — N289 Disorder of kidney and ureter, unspecified: Secondary | ICD-10-CM | POA: Diagnosis not present

## 2015-07-29 DIAGNOSIS — O24919 Unspecified diabetes mellitus in pregnancy, unspecified trimester: Secondary | ICD-10-CM

## 2015-07-29 DIAGNOSIS — O26832 Pregnancy related renal disease, second trimester: Secondary | ICD-10-CM | POA: Diagnosis not present

## 2015-07-29 DIAGNOSIS — Z3A16 16 weeks gestation of pregnancy: Secondary | ICD-10-CM | POA: Diagnosis not present

## 2015-07-29 DIAGNOSIS — D649 Anemia, unspecified: Secondary | ICD-10-CM | POA: Diagnosis present

## 2015-07-29 DIAGNOSIS — O24012 Pre-existing diabetes mellitus, type 1, in pregnancy, second trimester: Secondary | ICD-10-CM | POA: Diagnosis not present

## 2015-07-29 DIAGNOSIS — O99012 Anemia complicating pregnancy, second trimester: Secondary | ICD-10-CM | POA: Diagnosis not present

## 2015-07-29 DIAGNOSIS — Z7901 Long term (current) use of anticoagulants: Secondary | ICD-10-CM | POA: Diagnosis not present

## 2015-07-29 DIAGNOSIS — Z79899 Other long term (current) drug therapy: Secondary | ICD-10-CM | POA: Insufficient documentation

## 2015-07-29 DIAGNOSIS — Z833 Family history of diabetes mellitus: Secondary | ICD-10-CM | POA: Insufficient documentation

## 2015-07-29 DIAGNOSIS — O0991 Supervision of high risk pregnancy, unspecified, first trimester: Secondary | ICD-10-CM

## 2015-07-29 DIAGNOSIS — O10912 Unspecified pre-existing hypertension complicating pregnancy, second trimester: Secondary | ICD-10-CM

## 2015-07-29 DIAGNOSIS — D509 Iron deficiency anemia, unspecified: Secondary | ICD-10-CM

## 2015-07-29 LAB — CBC
HCT: 16.5 % — ABNORMAL LOW (ref 36.0–46.0)
HEMOGLOBIN: 5.9 g/dL — AB (ref 12.0–15.0)
MCH: 30.9 pg (ref 26.0–34.0)
MCHC: 35.8 g/dL (ref 30.0–36.0)
MCV: 86.4 fL (ref 78.0–100.0)
PLATELETS: 236 10*3/uL (ref 150–400)
RBC: 1.91 MIL/uL — AB (ref 3.87–5.11)
RDW: 14.1 % (ref 11.5–15.5)
WBC: 11.3 10*3/uL — AB (ref 4.0–10.5)

## 2015-07-29 MED ORDER — SODIUM CHLORIDE 0.9 % IV SOLN
510.0000 mg | Freq: Once | INTRAVENOUS | Status: AC
  Administered 2015-07-29: 510 mg via INTRAVENOUS
  Filled 2015-07-29: qty 17

## 2015-07-29 NOTE — MAU Note (Signed)
RN spoke with Dr. Ilda Basset about pt's lab results and POC. Dr. Ilda Basset will discuss with pt at her scheduled appointment on Monday.

## 2015-07-29 NOTE — MAU Provider Note (Signed)
History     CSN: [id number]  Arrival date and time: 07/29/15 [id number]   First Provider Initiated Contact with Patient 07/29/15 [phone]      No chief complaint on file.  HPI Christine Reed 30 y.o.  [redacted]w[redacted]d Client comes to MAU today for second weekly iron infusion.  Had last infusion last Saturday.  Has HGB of 6 although other labs drawn for anemia panel seem to be within normal limits.  Client is now in [facility] as a transfer from [facility].  She has Type 1 diabetes which is difficult to control and renal insufficiency.  Was hospitalized once in this pregnancy on May 13.  Had renal consult done then and was seen this past Thursday by [facility].  Has another appointment at [facility] on July 15.  Has an appointment with a new endocrinologist on July 5 - Dr. Dwyane Dee.  (unable to see Dr. Bubba Camp as has an outstanding bill in that office.)  Next appointment in the [facility] is Monday with the nutritionist.  She will bring the diary of her blood sugars.  Has had lots of low blood sugars which she then treats and then her sugar rebounds and is high.  Has had some headaches with her low blood sugars which do not resolve when the blood sugar is back up.    OB History    Gravida Para Term Preterm AB TAB SAB Ectopic Multiple Living   1               Past Medical History  Diagnosis Date  . Diabetes mellitus     diagnosed at age 41  . Hypertension     Past Surgical History  Procedure Laterality Date  . Tonsillectomy and adenoidectomy    . Lipo suction  2015  . Tonsillectomy      Family History  Problem Relation Age of Onset  . Cancer Maternal Grandmother     colon and breast  . Diabetes Maternal Grandfather   . Diabetes Mother   . Diabetes Father   . Diabetes Paternal Grandmother   . Diabetes Paternal Grandfather     Social History  Substance Use Topics  . Smoking status: Never Smoker   . Smokeless tobacco: Never Used  . Alcohol Use: No    Allergies:   Allergies  Allergen Reactions  . Iodine Rash    Oral and IV  . Nickel Rash    Prescriptions prior to admission  Medication Sig Dispense Refill Last Dose  . aspirin 81 MG chewable tablet Chew 1 tablet (81 mg total) by mouth once. 90 tablet 4 07/29/2015 at Unknown time  . diphenhydrAMINE (BENADRYL) 25 MG tablet Take 25 mg by mouth every 6 (six) hours as needed for itching.   Past Week at Unknown time  . enoxaparin (LOVENOX) 40 MG/0.4ML injection Inject 0.4 mLs (40 mg total) into the skin daily. 30 Syringe 11 Past Week at Unknown time  . famotidine (PEPCID) 20 MG tablet Take 1 tablet (20 mg total) by mouth 2 (two) times daily. 60 tablet 11 07/28/2015 at Unknown time  . insulin lispro (HUMALOG KWIKPEN) 100 UNIT/ML KiwkPen Inject 2-11 units into skin 3 (three) times daily with meals 15 mL 11 07/29/2015 at Unknown time  . insulin NPH Human (HUMULIN N,NOVOLIN N) 100 UNIT/ML injection Inject 0.08 mLs (8 Units total) into the skin 2 (two) times daily at 8 am and 10 pm. 10 mL 11 07/29/2015 at Unknown time  .  Prenatal Vit-Fe Fumarate-FA (MULTIVITAMIN-PRENATAL) 27-0.8 MG TABS tablet Take 1 tablet by mouth daily at 12 noon.   07/29/2015 at Unknown time  . metroNIDAZOLE (FLAGYL) 500 MG tablet Take 1 tablet (500 mg total) by mouth 2 (two) times daily. (Patient not taking: Reported on 07/29/2015) 14 tablet 0 07/22/2015 at Unknown time    Review of Systems  Cardiovascular: Negative for palpitations.  Gastrointestinal: Negative for abdominal pain.       Has bruising from Lovenox injections.  Genitourinary:       No vaginal discharge. No vaginal bleeding. No dysuria.  Neurological: Positive for headaches. Negative for dizziness.   Physical Exam   Blood pressure 134/84, pulse 98, temperature 98 F (36.7 C), temperature source Oral, resp. rate 18, height 5\' 5"  (1.651 m), weight 167 lb 9.6 oz (76.023 kg), last menstrual period 04/03/2015.  Physical Exam  Nursing note and vitals reviewed. Constitutional: She  is oriented to person, place, and time. She appears well-developed and well-nourished.  HENT:  Head: Normocephalic.  Eyes: EOM are normal.  Neck: Neck supple.  GI: Soft. There is no tenderness.  Bruising noted at lovenox injection sites.  On the right side a firm area underneath the skin is palpated.  FHT by doppler is 155.  Musculoskeletal: Normal range of motion.  Neurological: She is alert and oriented to person, place, and time.  Skin: Skin is warm and dry.  Psychiatric: She has a normal mood and affect.    MAU Course  Procedures Results for orders placed or performed during the hospital encounter of 07/29/15 (from the past 24 hour(s))  CBC     Status: Abnormal   Collection Time: 07/29/15  9:43 AM  Result Value Ref Range   WBC 11.3 (H) 4.0 - 10.5 K/uL   RBC 1.91 (L) 3.87 - 5.11 MIL/uL   Hemoglobin 5.9 (LL) 12.0 - 15.0 g/dL   HCT 16.5 (L) 36.0 - 46.0 %   MCV 86.4 78.0 - 100.0 fL   MCH 30.9 26.0 - 34.0 pg   MCHC 35.8 30.0 - 36.0 g/dL   RDW 14.1 11.5 - 15.5 %   Platelets 236 150 - 400 K/uL    MDM Consult with Dr. Ilda Basset and will proceed with iron infusion today.  Iron infusion completed.   Assessment and Plan  16w pregnancy Diabetes Renal insufficiency  Plan Keep all scheduled appointments. Return with any cramping or bleeding.  Kwamane Whack 07/29/2015, 10:26 AM

## 2015-07-29 NOTE — Discharge Instructions (Signed)
Keep all your appointments as scheduled.  Bring your blood sugar recordings to your appointment on Monday. Continue to manage your blood sugars.  Do not drive if your blood sugar is low.

## 2015-07-29 NOTE — MAU Note (Signed)
Iron infusion

## 2015-07-31 ENCOUNTER — Encounter: Payer: BLUE CROSS/BLUE SHIELD | Admitting: Dietician

## 2015-07-31 ENCOUNTER — Ambulatory Visit (INDEPENDENT_AMBULATORY_CARE_PROVIDER_SITE_OTHER): Payer: BLUE CROSS/BLUE SHIELD | Admitting: Obstetrics and Gynecology

## 2015-07-31 VITALS — BP 136/88 | HR 96 | Temp 98.7°F | Wt 168.5 lb

## 2015-07-31 DIAGNOSIS — O24012 Pre-existing diabetes mellitus, type 1, in pregnancy, second trimester: Secondary | ICD-10-CM | POA: Diagnosis not present

## 2015-07-31 DIAGNOSIS — D638 Anemia in other chronic diseases classified elsewhere: Secondary | ICD-10-CM

## 2015-07-31 DIAGNOSIS — O10912 Unspecified pre-existing hypertension complicating pregnancy, second trimester: Secondary | ICD-10-CM | POA: Diagnosis not present

## 2015-07-31 DIAGNOSIS — E10319 Type 1 diabetes mellitus with unspecified diabetic retinopathy without macular edema: Secondary | ICD-10-CM

## 2015-07-31 DIAGNOSIS — O0992 Supervision of high risk pregnancy, unspecified, second trimester: Secondary | ICD-10-CM

## 2015-07-31 DIAGNOSIS — O24919 Unspecified diabetes mellitus in pregnancy, unspecified trimester: Secondary | ICD-10-CM | POA: Diagnosis not present

## 2015-07-31 LAB — POCT URINALYSIS DIP (DEVICE)
BILIRUBIN URINE: NEGATIVE
Glucose, UA: NEGATIVE mg/dL
KETONES UR: NEGATIVE mg/dL
LEUKOCYTES UA: NEGATIVE
Nitrite: NEGATIVE
Protein, ur: >=300 mg/dL — AB
Specific Gravity, Urine: 1.02 (ref 1.005–1.030)
Urobilinogen, UA: 0.2 mg/dL (ref 0.0–1.0)
pH: 6 (ref 5.0–8.0)

## 2015-07-31 NOTE — Progress Notes (Signed)
Subjective:  Christine Reed is a 30 y.o. G1P0 at [redacted]w[redacted]d being seen today for ongoing prenatal care.  She is currently monitored for the following issues for this high-risk pregnancy and has T1DM - Type F; Supervision of high risk pregnancy, antepartum; Anemia, chronic disease; Chronic hypertension with exacerbation during pregnancy in second trimester; and Diabetic retinopathy ([place]) on her problem list.  Patient reports no complaints.   Contractions: Irritability. Vag. Bleeding: None.  Movement: Absent. Denies leaking of fluid.   The following portions of the patient's history were reviewed and updated as appropriate: allergies, current medications, past family history, past medical history, past social history, past surgical history and problem list. Problem list updated.  Objective:   Filed Vitals:   07/31/15 0931  BP: 136/88  Pulse: 96  Temp: 98.7 F (37.1 C)  Weight: 168 lb 8 oz (76.431 kg)    Fetal Status: Fetal Heart Rate (bpm): 145   Movement: Absent     General:  Alert, oriented and cooperative. Patient is in no acute distress.  Skin: Skin is warm and dry. No rash noted.   Cardiovascular: Normal heart rate noted  Respiratory: Normal respiratory effort, no problems with respiration noted  Abdomen: Soft, gravid, appropriate for gestational age. Pain/Pressure: Absent     Pelvic:  Cervical exam deferred        Extremities: Normal range of motion.  Edema: None  Mental Status: Normal mood and affect. Normal behavior. Normal judgment and thought content.   Urinalysis: Urine Protein: 4+ Urine Glucose: Negative  Assessment and Plan:  Pregnancy: G1P0 at [redacted]w[redacted]d  1. Supervision of high risk pregnancy, antepartum, second trimester Routine care. Patient amenable to AFP today. Has anatomy scan with MFM on 7-5  2. Pre-existing type 1 diabetes mellitus during pregnancy in second trimester -BS log (<100, <120s x 3) with a few sporadic ones in the 170s-low 200s with patient able to remember  that it was diet related, forgot to take insulin dose, etc.  -Currently on NPH 8/8 and SSI carb counts 10:1 for Humalog and usually around 3-4/5-6/5-6 units for meals -Has early July Endocrine appointment -Stressed to her the importance of good DM control, to include maternal fetal risks to include risk of IUFD, anomalies and lower threshold for her going into DKA (high 100s) with pregnancy - Referral to Nutrition and Diabetes Services -continue ppx lovenox given baseline proteinuria  3. Diabetic retinopathy associated with type 1 diabetes mellitus, macular edema presence unspecified, unspecified retinopathy severity ([place]) Ask patient nv regarding optho visit  4. Anemia, chronic disease -Asymptomatic. Will send note to Nephro providers, re doing transfusion vs them doing EPO, given iron likely isn't to be too helpful given her decreased renal function. She states they took blood work from her the last time she was there a few days ago, such as to check for lupus; she has repeat appointment with them on 6/29  5. Diabetes mellitus complicating pregnancy, unspecified trimester ([place]) See above  6. Chronic hypertension with exacerbation during pregnancy in second trimester See above  Patient not understanding why such a high risk pregnancy given the fact that she feels fine. I told her that her baseline co morbidities puts her and fetus at risk for many issues,with the main ones being fetal anomalies, worsening of her cardiac and renal function, to include pre-eclampsia and preterm labor, whether medically indicated or spontaneous. I told her the best thing she can do is to continue to keep her appointments and have good BS control  and follow ours recommendations which she is very amenable to.   Preterm labor symptoms and general obstetric precautions including but not limited to vaginal bleeding, contractions, leaking of fluid and fetal movement were reviewed in detail with the patient. Please  refer to After Visit Summary for other counseling recommendations.   2wk RTC  Aletha Halim, MD

## 2015-07-31 NOTE — Progress Notes (Signed)
07/31/15 Diabetes Educator Seen for F/U today.  She is frustrated with feeling "tired and sick/nauseated all the time."  Has not seen the endocrinologist at this time has an appointment on July 5th with Dr. Dwyane Dee at [facility]. Fasting levels range from [id number]2.  Some of the extreme highs result when she awakens nauseated and vomiting, then will have a high when she checks glucose. Having issues with glucose levels being slow to respond to the juice that she uses to treat the lose.  Notes that with treating a 38 low, in 15 minutes it was 37 and in 15 minutes later, she had only come up to 38 mg/dl. Having problems rapidly responding and then ends up with over treating or accumulating glucose and with have highs in the 250, 339 range.   Recommended that she try using glucose gel R/T the juice box juice to treat her low. Insist that she is using food labels for her carb counting. Ask that since she will only use a diary to record blood glucose levels, that she design a page that will include columns for both her glucose reading and her carb count and the item that she used to treat the glucose low. Bring the new log to all clinic appointments and especially to appointment with the endo, Dr. Dwyane Dee. She needs to get beyond the nausea.  I feel that she can be more stable without the nausea.  She Shaneque Merkle need a slight change in her insulin/carb ratio.  Dr. Dwyane Dee can help [place] with this. Maggie Faatimah Spielberg, RN, RD, LDN

## 2015-08-02 MED FILL — ENOXAPARIN 40 MG/0.4 ML SYR: 40 | 30 days supply | Qty: 12 | Fill #1

## 2015-08-03 ENCOUNTER — Telehealth: Payer: Self-pay | Admitting: Obstetrics and Gynecology

## 2015-08-03 LAB — AFP, QUAD SCREEN
AFP: 19.8 ng/mL
Curr Gest Age: 16.3 weeks
Down Syndrome Scr Risk Est: 1:771 {titer}
HCG, Total: 56.5 IU/mL
INH: 176.5 pg/mL
Interpretation-AFP: NEGATIVE
MOM FOR HCG: 1.49
MoM for AFP: 0.67
MoM for INH: 1.07
Open Spina bifida: NEGATIVE
Tri 18 Scr Risk Est: NEGATIVE
Trisomy 18 (Edward) Syndrome Interp.: 1:42000 {titer}
UE3 MOM: 0.89
UE3 VALUE: 0.77 ng/mL

## 2015-08-03 NOTE — Telephone Encounter (Signed)
[facility] called and Dr. Marval Regal out of the office this and next week but they will ask another physician re: EPO vs doing a blood transfusion.  Durene Romans MD Attending Center for [employer] [facility] [employer])

## 2015-08-03 NOTE — Telephone Encounter (Signed)
[facility] called and recommended transfusion. Message sent to pool for two units PRBCs. Patient aware.  Durene Romans MD Attending Center for [employer])

## 2015-08-07 ENCOUNTER — Encounter: Payer: Self-pay | Admitting: Physician Assistant

## 2015-08-07 ENCOUNTER — Encounter: Payer: Self-pay | Admitting: Obstetrics & Gynecology

## 2015-08-07 ENCOUNTER — Telehealth: Payer: Self-pay | Admitting: Obstetrics and Gynecology

## 2015-08-07 DIAGNOSIS — E1121 Type 2 diabetes mellitus with diabetic nephropathy: Secondary | ICD-10-CM | POA: Insufficient documentation

## 2015-08-07 DIAGNOSIS — N183 Chronic kidney disease, stage 3 unspecified: Secondary | ICD-10-CM | POA: Insufficient documentation

## 2015-08-07 NOTE — Telephone Encounter (Signed)
OB Telephone Note Re: outpatient blood transfusion, house coverage states that MAU does this. I spoke to them and they said that she can show up when convenient for her  for two units of PRBCs, due to her work schedule. Patient called and told this information and may come by after work today to update her type and screen and get set up for two units for crossmatched blood and then come back at a particular date and time or she may come and do it all at once tonight.  Durene Romans MD Attending Center for [employer])

## 2015-08-12 ENCOUNTER — Encounter (HOSPITAL_COMMUNITY): Payer: Self-pay

## 2015-08-12 ENCOUNTER — Observation Stay (HOSPITAL_COMMUNITY)
Admission: AD | Admit: 2015-08-12 | Discharge: 2015-08-12 | Disposition: A | Discharge status: Home / Self Care | Payer: BLUE CROSS/BLUE SHIELD | Source: Ambulatory Visit | Attending: Family Medicine | Admitting: Family Medicine

## 2015-08-12 DIAGNOSIS — I129 Hypertensive chronic kidney disease with stage 1 through stage 4 chronic kidney disease, or unspecified chronic kidney disease: Secondary | ICD-10-CM | POA: Insufficient documentation

## 2015-08-12 DIAGNOSIS — D649 Anemia, unspecified: Secondary | ICD-10-CM | POA: Diagnosis not present

## 2015-08-12 DIAGNOSIS — Z3A18 18 weeks gestation of pregnancy: Secondary | ICD-10-CM | POA: Diagnosis not present

## 2015-08-12 DIAGNOSIS — Z7982 Long term (current) use of aspirin: Secondary | ICD-10-CM | POA: Diagnosis not present

## 2015-08-12 DIAGNOSIS — O24012 Pre-existing diabetes mellitus, type 1, in pregnancy, second trimester: Secondary | ICD-10-CM | POA: Insufficient documentation

## 2015-08-12 DIAGNOSIS — N183 Chronic kidney disease, stage 3 (moderate): Secondary | ICD-10-CM | POA: Insufficient documentation

## 2015-08-12 DIAGNOSIS — O10212 Pre-existing hypertensive chronic kidney disease complicating pregnancy, second trimester: Secondary | ICD-10-CM | POA: Insufficient documentation

## 2015-08-12 DIAGNOSIS — O99012 Anemia complicating pregnancy, second trimester: Principal | ICD-10-CM

## 2015-08-12 LAB — PREPARE RBC (CROSSMATCH)

## 2015-08-12 LAB — GLUCOSE, CAPILLARY
GLUCOSE-CAPILLARY: 101 mg/dL — AB (ref 65–99)
Glucose-Capillary: 100 mg/dL — ABNORMAL HIGH (ref 65–99)
Glucose-Capillary: 158 mg/dL — ABNORMAL HIGH (ref 65–99)

## 2015-08-12 LAB — CBC
HCT: 18 % — ABNORMAL LOW (ref 36.0–46.0)
HEMOGLOBIN: 6.6 g/dL — AB (ref 12.0–15.0)
MCH: 32 pg (ref 26.0–34.0)
MCHC: 36.7 g/dL — AB (ref 30.0–36.0)
MCV: 87.4 fL (ref 78.0–100.0)
Platelets: 235 10*3/uL (ref 150–400)
RBC: 2.06 MIL/uL — AB (ref 3.87–5.11)
RDW: 13.8 % (ref 11.5–15.5)
WBC: 11.6 10*3/uL — ABNORMAL HIGH (ref 4.0–10.5)

## 2015-08-12 LAB — HEMOGLOBIN AND HEMATOCRIT, BLOOD
HEMATOCRIT: 24.6 % — AB (ref 36.0–46.0)
Hemoglobin: 8.9 g/dL — ABNORMAL LOW (ref 12.0–15.0)

## 2015-08-12 MED ORDER — ACETAMINOPHEN 325 MG PO TABS: 650.0000 mg | ORAL_TABLET | ORAL | Status: DC | PRN

## 2015-08-12 MED ORDER — PRENATAL MULTIVITAMIN CH: 1.0000 | ORAL_TABLET | Freq: Every day | ORAL | Status: DC

## 2015-08-12 MED ORDER — SODIUM CHLORIDE 0.9 % IV SOLN
Freq: Once | INTRAVENOUS | Status: AC
  Administered 2015-08-12: 10:00:00 via INTRAVENOUS

## 2015-08-12 MED ORDER — CALCIUM CARBONATE ANTACID 500 MG PO CHEW: 2.0000 | CHEWABLE_TABLET | ORAL | Status: DC | PRN

## 2015-08-12 MED ORDER — ZOLPIDEM TARTRATE 5 MG PO TABS: 5.0000 mg | ORAL_TABLET | Freq: Every evening | ORAL | Status: DC | PRN

## 2015-08-12 MED ORDER — FAMOTIDINE 20 MG PO TABS: 20.0000 mg | ORAL_TABLET | Freq: Two times a day (BID) | ORAL | Status: DC

## 2015-08-12 MED ORDER — ACETAMINOPHEN 325 MG PO TABS
650.0000 mg | ORAL_TABLET | Freq: Once | ORAL | Status: AC
  Administered 2015-08-12: 650 mg via ORAL
  Filled 2015-08-12: qty 2

## 2015-08-12 MED ORDER — DOCUSATE SODIUM 100 MG PO CAPS: 100.0000 mg | ORAL_CAPSULE | Freq: Every day | ORAL | Status: DC

## 2015-08-12 MED ORDER — METRONIDAZOLE 500 MG PO TABS: 500.0000 mg | ORAL_TABLET | Freq: Two times a day (BID) | ORAL | Status: DC

## 2015-08-12 MED ORDER — INSULIN NPH (HUMAN) (ISOPHANE) 100 UNIT/ML ~~LOC~~ SUSP
8.0000 [IU] | Freq: Two times a day (BID) | SUBCUTANEOUS | Status: DC
  Filled 2015-08-12: qty 10

## 2015-08-12 MED ORDER — DIPHENHYDRAMINE HCL 25 MG PO CAPS
25.0000 mg | ORAL_CAPSULE | Freq: Once | ORAL | Status: AC
  Administered 2015-08-12: 25 mg via ORAL
  Filled 2015-08-12: qty 1

## 2015-08-12 MED ORDER — SODIUM CHLORIDE 0.9 % IV SOLN
INTRAVENOUS | Status: DC
  Administered 2015-08-12: 12:00:00 via INTRAVENOUS

## 2015-08-12 MED ORDER — INSULIN ASPART 100 UNIT/ML ~~LOC~~ SOLN
0.0000 [IU] | Freq: Three times a day (TID) | SUBCUTANEOUS | Status: DC
  Administered 2015-08-12: 0 [IU] via SUBCUTANEOUS
  Administered 2015-08-12: 3 [IU] via SUBCUTANEOUS

## 2015-08-12 NOTE — Discharge Summary (Signed)
OB Discharge Summary     Patient Name: Christine Reed DOB: Apr 09, 1985 MRN: [id number]  Date of admission: 08/12/2015 Delivering MD: This patient has no babies on file.  Date of discharge: 08/12/2015  Admitting diagnosis: BLOOD TRANSFUSION Intrauterine pregnancy: [redacted]w[redacted]d     Secondary diagnosis:  Active Problems:   Anemia affecting pregnancy in second trimester, antepartum  Additional problems: none     Discharge diagnosis: same                                   Hospital course:  Patient observed for blood transfusion.  Received 3 units of blood.  Hemoglobin now 8.9.  Patient discharged to home.  Physical exam  Filed Vitals:   08/12/15 1443 08/12/15 1459 08/12/15 1608 08/12/15 1638  BP: 135/80 152/91 158/90 152/89  Pulse: 79 80 76 77  Temp: 98.7 F (37.1 C) 98.4 F (36.9 C) 98.2 F (36.8 C)   TempSrc: Oral Oral Oral   Resp: 16 18 20    Height:      Weight:       Exam unchanged from this morning.  Labs: Lab Results  Component Value Date   WBC 11.6* 08/12/2015   HGB 8.9* 08/12/2015   HCT 24.6* 08/12/2015   MCV 87.4 08/12/2015   PLT 235 08/12/2015   CMP Latest Ref Rng 07/12/2015  Glucose 65 - 99 mg/dL 146(H)  BUN 6 - 20 mg/dL 19  Creatinine 0.44 - 1.00 mg/dL 1.60(H)  Sodium 135 - 145 mmol/L 138  Potassium 3.5 - 5.1 mmol/L 4.4  Chloride 101 - 111 mmol/L 110  CO2 22 - 32 mmol/L 22  Calcium 8.9 - 10.3 mg/dL 8.3(L)  Total Protein 6.5 - 8.1 g/dL 5.4(L)  Total Bilirubin 0.3 - 1.2 mg/dL 0.5  Alkaline Phos 38 - 126 U/L 38  AST 15 - 41 U/L 17  ALT 14 - 54 U/L 17    Discharge instruction: per After Visit Summary and "Baby and Me Booklet".  After visit meds:    Medication List    TAKE these medications        acetaminophen 500 MG tablet  Commonly known as:  TYLENOL  Take 500 mg by mouth every 6 (six) hours as needed for mild pain or headache.     aspirin 81 MG chewable tablet  Chew 81 mg by mouth daily.     diphenhydrAMINE 25 MG tablet  Commonly known as:   BENADRYL  Take 25 mg by mouth every 6 (six) hours as needed for itching or allergies.     enoxaparin 40 MG/0.4ML injection  Commonly known as:  LOVENOX  Inject 40 mg into the skin at bedtime.     famotidine 20 MG tablet  Commonly known as:  PEPCID  Take 1 tablet (20 mg total) by mouth 2 (two) times daily.     HUMALOG KWIKPEN 100 UNIT/ML KiwkPen  Generic drug:  insulin lispro  Inject 2-11 Units into the skin 3 (three) times daily with meals. Pt uses per sliding scale.     insulin NPH Human 100 UNIT/ML injection  Commonly known as:  HUMULIN N,NOVOLIN N  Inject 0.08 mLs (8 Units total) into the skin 2 (two) times daily at 8 am and 10 pm.     multivitamin-prenatal 27-0.8 MG Tabs tablet  Take 1 tablet by mouth daily.        Diet: routine diet  Activity: Advance as  tolerated. Pelvic rest for 6 weeks.   Follow up Appt:Future Appointments Date Time Provider Amanda  08/14/2015 10:45 AM [place]  08/16/2015 8:15 AM [place] [facility]  08/16/2015 11:15 AM Elayne Snare, MD [facility] None  08/28/2015 10:40 AM Gwynne Edinger, MD [facility]     08/12/2015 Marcelino Campos JEHIEL, DO

## 2015-08-12 NOTE — H&P (Signed)
Christine Reed is a 30 y.o. female presenting for blood transfusion due to anemia.  See note below for details.  Patient states she feels fine, but " a little tired".   Lives alone in an apartment but is currently staying with her mother "till she gets stronger".  Note from Dr Ilda Basset Office: Christine Reed is a 30 y.o. G1P0 at [redacted]w[redacted]d being seen today for ongoing prenatal care. She is currently monitored for the following issues for this high-risk pregnancy and has T1DM - Type F; Supervision of high risk pregnancy, antepartum; Anemia, chronic disease; Chronic hypertension with exacerbation during pregnancy in second trimester; and Diabetic retinopathy ([place]) on her problem list.  1. Supervision of high risk pregnancy, antepartum, second trimester Routine care. Patient amenable to AFP today. Has anatomy scan with MFM on 7-5  2. Pre-existing type 1 diabetes mellitus during pregnancy in second trimester -BS log (<100, <120s x 3) with a few sporadic ones in the 170s-low 200s with patient able to remember that it was diet related, forgot to take insulin dose, etc.  -Currently on NPH 8/8 and SSI carb counts 10:1 for Humalog and usually around 3-4/5-6/5-6 units for meals -Has early July Endocrine appointment -Stressed to her the importance of good DM control, to include maternal fetal risks to include risk of IUFD, anomalies and lower threshold for her going into DKA (high 100s) with pregnancy - Referral to Nutrition and Diabetes Services -continue ppx lovenox given baseline proteinuria  3. Diabetic retinopathy associated with type 1 diabetes mellitus, macular edema presence unspecified, unspecified retinopathy severity ([place]) Ask patient nv regarding optho visit  4. Anemia, chronic disease -Asymptomatic. Will send note to Nephro providers, re doing transfusion vs them doing EPO, given iron likely isn't to be too helpful given her decreased renal function. She states they took blood work from her the last  time she was there a few days ago, such as to check for lupus; she has repeat appointment with them on 6/29  5. Diabetes mellitus complicating pregnancy, unspecified trimester ([place]) See above  6. Chronic hypertension with exacerbation during pregnancy in second trimester See above  Patient not understanding why such a high risk pregnancy given the fact that she feels fine. I told her that her baseline co morbidities puts her and fetus at risk for many issues,with the main ones being fetal anomalies, worsening of her cardiac and renal function, to include pre-eclampsia and preterm labor, whether medically indicated or spontaneous. I told her the best thing she can do is to continue to keep her appointments and have good BS control and follow ours recommendations which she is very amenable to.   Maternal Medical History:  Reason for admission: Nausea.    OB History    Gravida Para Term Preterm AB TAB SAB Ectopic Multiple Living   1              Past Medical History  Diagnosis Date  . Diabetes mellitus     diagnosed at age 27  . Hypertension    Past Surgical History  Procedure Laterality Date  . Tonsillectomy and adenoidectomy    . Lipo suction  2015  . Tonsillectomy     Family History: family history includes Cancer in her maternal grandmother; Diabetes in her father, maternal grandfather, mother, paternal grandfather, and paternal grandmother. Social History:  reports that she has never smoked. She has never used smokeless tobacco. She reports that she does not drink alcohol or use illicit drugs.  Prenatal Transfer Tool  Maternal Diabetes: Yes:  Diabetes Type:  Pre-pregnancy, Insulin/Medication controlled Genetic Screening: Normal Maternal Ultrasounds/Referrals: Normal Fetal Ultrasounds or other Referrals:  None Maternal Substance Abuse:  No Significant Maternal Medications:  Meds include: Other:  Significant Maternal Lab Results:  None Other Comments:  Type I DM treated  by Endo and OB  Review of Systems  Constitutional: Negative for fever, chills and malaise/fatigue.  Respiratory: Negative for shortness of breath.   Gastrointestinal: Negative for nausea, vomiting, abdominal pain, diarrhea and constipation.  Musculoskeletal: Negative for back pain.  Neurological: Positive for weakness. Negative for dizziness and headaches.      Blood pressure 146/86, pulse 88, temperature 98.5 F (36.9 C), temperature source Oral, resp. rate 20, height 5\' 5"  (1.651 m), weight 168 lb (76.204 kg), last menstrual period 04/03/2015. Maternal Exam:  Abdomen: Patient reports no abdominal tenderness. Fundal height is 18.    Introitus: Amniotic fluid character: not assessed.     Fetal Exam Fetal Monitor Review: Baseline rate: 153 by doppler.      Physical Exam  Constitutional: She is oriented to person, place, and time. She appears well-developed and well-nourished. No distress.  HENT:  Head: Normocephalic.  Neck: Normal range of motion. Neck supple.  Cardiovascular: Normal rate, regular rhythm and normal heart sounds.  Exam reveals no gallop and no friction rub.   No murmur heard. Respiratory: Effort normal and breath sounds normal. No respiratory distress. She has no wheezes. She has no rales.  GI: Soft. She exhibits no distension. There is no tenderness. There is no rebound and no guarding.  Genitourinary:  Vaginal exam not indicated  Musculoskeletal: Normal range of motion. She exhibits edema (trace).  Neurological: She is alert and oriented to person, place, and time.  Skin: Skin is warm and dry.  Psychiatric: She has a normal mood and affect.    Prenatal labs: ABO, Rh: --/--/O POS, O POS (05/13 1221) Antibody: NEG (05/13 1221) Rubella: Immune (05/03 0000) RPR: Nonreactive (05/03 0000)  HBsAg: Negative (05/03 0000)  HIV: Non-reactive (05/03 0000)  GBS:     Assessment/Plan: [facility] at [redacted]w[redacted]d  Type I diabetes, takes insulin scheduled and SS with Carb  Counting Diabetic retinopathy Anemia Chronic Kidney Disease, renal insufficiency, Stage 3 (nephropathy) Chronic Hypertension with exacerbation in pregnancy  Admit to observation for Transfusion Transfuse 3 units PRBC Dr Nehemiah Settle has been in to see patient Plan discharge after transfusion    [facility] 08/12/2015, 9:52 AM

## 2015-08-13 LAB — TYPE AND SCREEN
ABO/RH(D): O POS
ANTIBODY SCREEN: NEGATIVE
UNIT DIVISION: 0
Unit division: 0
Unit division: 0

## 2015-08-14 ENCOUNTER — Ambulatory Visit: Payer: BLUE CROSS/BLUE SHIELD

## 2015-08-16 ENCOUNTER — Ambulatory Visit (INDEPENDENT_AMBULATORY_CARE_PROVIDER_SITE_OTHER): Payer: BLUE CROSS/BLUE SHIELD | Admitting: Endocrinology

## 2015-08-16 ENCOUNTER — Ambulatory Visit (HOSPITAL_COMMUNITY)
Admission: RE | Admit: 2015-08-16 | Discharge: 2015-08-16 | Disposition: A | Discharge status: Home / Self Care | Payer: BLUE CROSS/BLUE SHIELD | Source: Ambulatory Visit | Attending: Obstetrics and Gynecology | Admitting: Obstetrics and Gynecology

## 2015-08-16 ENCOUNTER — Encounter: Payer: Self-pay | Admitting: Endocrinology

## 2015-08-16 ENCOUNTER — Other Ambulatory Visit: Payer: Self-pay | Admitting: Obstetrics and Gynecology

## 2015-08-16 ENCOUNTER — Ambulatory Visit (HOSPITAL_COMMUNITY): Payer: BLUE CROSS/BLUE SHIELD

## 2015-08-16 VITALS — BP 128/82 | HR 89 | Ht 65.0 in | Wt 176.0 lb

## 2015-08-16 DIAGNOSIS — O468X9 Other antepartum hemorrhage, unspecified trimester: Secondary | ICD-10-CM

## 2015-08-16 DIAGNOSIS — O24919 Unspecified diabetes mellitus in pregnancy, unspecified trimester: Secondary | ICD-10-CM

## 2015-08-16 DIAGNOSIS — O0990 Supervision of high risk pregnancy, unspecified, unspecified trimester: Secondary | ICD-10-CM

## 2015-08-16 DIAGNOSIS — D638 Anemia in other chronic diseases classified elsewhere: Secondary | ICD-10-CM

## 2015-08-16 DIAGNOSIS — E1065 Type 1 diabetes mellitus with hyperglycemia: Secondary | ICD-10-CM | POA: Diagnosis not present

## 2015-08-16 DIAGNOSIS — O2692 Pregnancy related conditions, unspecified, second trimester: Secondary | ICD-10-CM | POA: Insufficient documentation

## 2015-08-16 DIAGNOSIS — O10012 Pre-existing essential hypertension complicating pregnancy, second trimester: Secondary | ICD-10-CM | POA: Diagnosis not present

## 2015-08-16 DIAGNOSIS — Z3A18 18 weeks gestation of pregnancy: Secondary | ICD-10-CM | POA: Insufficient documentation

## 2015-08-16 DIAGNOSIS — O24012 Pre-existing diabetes mellitus, type 1, in pregnancy, second trimester: Secondary | ICD-10-CM | POA: Diagnosis present

## 2015-08-16 DIAGNOSIS — O418X90 Other specified disorders of amniotic fluid and membranes, unspecified trimester, not applicable or unspecified: Secondary | ICD-10-CM

## 2015-08-16 DIAGNOSIS — Z36 Encounter for antenatal screening of mother: Secondary | ICD-10-CM | POA: Insufficient documentation

## 2015-08-16 NOTE — Progress Notes (Addendum)
Patient ID: Christine Reed, female   DOB: 1986-01-22, 30 y.o.   MRN: [id number]           Reason for Appointment : Consultation for Type 1 Diabetes  History of Present Illness          Diagnosis: Type 1 diabetes mellitus, date of diagnosis: 2000         Previous history:   She was initially started on NPH and Regular Insulin and subsequently was on Lantus and Humalog Usually has had poor control but records are not available for the last few years from her previous endocrinologist In April her Lantus was changed Novolin N presumably because of tendency to low sugars during the night  Recent history:   INSULIN regimen is: Humalog at meals carbohydrate coverage 1:10, correction factor II units per 50 mg over 150 NPH 8 units twice a day   Current management, blood sugar patterns and problems identified:    She was found to have early pregnancy in 5/17 and is now referred here for further management  She is using a Generic monitor for testing and keeping the record in a diary.  FASTING blood sugars are quite variable, recently ranging from 82-236, usually not below 100  Blood sugars are relatively lower at lunchtime and occasionally has low blood sugars  Suppertime blood sugars are recently low about half the time and otherwise mildly increased  Bedtime blood sugars are mildly increased but variable  She will tend to have a significant rebound sometimes when she has a low blood sugar  She thinks she is having hypoglycemia fairly frequently during the day and occasionally overnight.  She is usually treating low sugars with juice but sometimes this does not work quickly and at times will have severe symptoms where she would feel very weak and needs assistance or treatment  She is adjusting her Humalog insulin based on her abided intake and she thinks she is able to estimate her carbohydrates Fairly well with the materials she was given in the hospital.  Usually not adjusting insulin  for higher fat meals  Occasionally appears to have a low sugar after doing a correction for high reading   Glucose monitoring:  is being done 3 times a day         Glucometer:  [employer] Blood Glucose readings from meter review of diary:  Mean values apply above for all meters except median for One Touch  PRE-MEAL Fasting Lunch Dinner Bedtime Overall  Glucose range: 82-236  39-180  43-164  62-410    Mean/median:        Hypoglycemia:  occurs at Various times Factors causing hyperglycemia:Excessive insulin Symptoms of hypoglycemia:Weakness Treatment of hypoglycemia:As above          Self-care: The diet that the patient has been following is: Carbohydrate counting Usually eating cream of wheat at breakfast, mostly salad at lunch and dinner is usually at 5 PM with fish, rice and broccoli Snacks usually are apple or crackers          Exercise:  one hour dance lessons in the evenings after supper.  Usually having.  Fruit before going for exercise and no hypoglycemia with exercise          Dietician consultation: Most recent: 5/17 in the hospital .         CDE consultation:?  Diabetes labs:  Lab Results  Component Value Date   HGBA1C 6.2* 07/17/2015   HGBA1C 12.1 02/18/2012   HGBA1C *  08/07/2007    10.1 (NOTE)   The ADA recommends the following therapeutic goal for glycemic   control related to Hgb A1C measurement:   Goal of Therapy:   < 7.0% Hgb A1C   Reference: American Diabetes Association: Clinical Practice   Recommendations 2008, Diabetes Care,  2008, 31:(Suppl 1).   Lab Results  Component Value Date   LDLCALC 131* 06/27/2015   CREATININE 1.60* 07/12/2015   Microalbumin ratio 5011 done in 2/17  No results found for: MICRALBCREAT     Medication List       This list is accurate as of: 08/16/15  1:09 PM.  Always use your most recent med list.               acetaminophen 500 MG tablet  Commonly known as:  TYLENOL  Take 500 mg by mouth every 6 (six) hours as  needed for mild pain or headache.     aspirin 81 MG chewable tablet  Chew 81 mg by mouth daily.     diphenhydrAMINE 25 MG tablet  Commonly known as:  BENADRYL  Take 25 mg by mouth every 6 (six) hours as needed for itching or allergies.     enoxaparin 40 MG/0.4ML injection  Commonly known as:  LOVENOX  Inject 40 mg into the skin at bedtime.     famotidine 20 MG tablet  Commonly known as:  PEPCID  Take 1 tablet (20 mg total) by mouth 2 (two) times daily.     HUMALOG KWIKPEN 100 UNIT/ML KiwkPen  Generic drug:  insulin lispro  Inject 2-11 Units into the skin 3 (three) times daily with meals. Pt uses per sliding scale.     insulin NPH Human 100 UNIT/ML injection  Commonly known as:  HUMULIN N,NOVOLIN N  Inject 0.08 mLs (8 Units total) into the skin 2 (two) times daily at 8 am and 10 pm.     multivitamin-prenatal 27-0.8 MG Tabs tablet  Take 1 tablet by mouth daily.        Allergies:  Allergies  Allergen Reactions  . Iodine Rash  . Nickel Rash    Past Medical History  Diagnosis Date  . Diabetes mellitus     diagnosed at age 64  . Hypertension     Past Surgical History  Procedure Laterality Date  . Tonsillectomy and adenoidectomy    . Lipo suction  2015  . Tonsillectomy      Family History  Problem Relation Age of Onset  . Cancer Maternal Grandmother     colon and breast  . Diabetes Maternal Grandfather   . Diabetes Mother   . Diabetes Father   . Diabetes Paternal Grandmother   . Diabetes Paternal Grandfather     Social History:  reports that she has never smoked. She has never used smokeless tobacco. She reports that she does not drink alcohol or use illicit drugs.      Review of Systems  Eyes: Positive for blurred vision.       Better now  Cardiovascular: Positive for leg swelling.  Gastrointestinal: Negative for diarrhea.  Genitourinary: Positive for frequency and nocturia.  Neurological: Negative for numbness and tingling.        Lipids:     LABS:  Admission on 08/12/2015, Discharged on 08/12/2015  Component Date Value Ref Range Status  . ABO/RH(D) 08/12/2015 O POS   Final  . Antibody Screen 08/12/2015 NEG   Final  . Sample Expiration 08/12/2015 08/15/2015   Final  . Unit  Number 08/12/2015 [id number]   Final  . Blood Component Type 08/12/2015 RED CELLS,LR   Final  . Unit division 08/12/2015 00   Final  . Status of Unit 08/12/2015 ISSUED,FINAL   Final  . Transfusion Status 08/12/2015 OK TO TRANSFUSE   Final  . Crossmatch Result 08/12/2015 Compatible   Final  . Unit Number 08/12/2015 [id number]   Final  . Blood Component Type 08/12/2015 RED CELLS,LR   Final  . Unit division 08/12/2015 00   Final  . Status of Unit 08/12/2015 ISSUED,FINAL   Final  . Transfusion Status 08/12/2015 OK TO TRANSFUSE   Final  . Crossmatch Result 08/12/2015 Compatible   Final  . Unit Number 08/12/2015 [id number]   Final  . Blood Component Type 08/12/2015 RED CELLS,LR   Final  . Unit division 08/12/2015 00   Final  . Status of Unit 08/12/2015 ISSUED,FINAL   Final  . Transfusion Status 08/12/2015 OK TO TRANSFUSE   Final  . Crossmatch Result 08/12/2015 Compatible   Final  . WBC 08/12/2015 11.6* 4.0 - 10.5 K/uL Final  . RBC 08/12/2015 2.06* 3.87 - 5.11 MIL/uL Final  . Hemoglobin 08/12/2015 6.6* 12.0 - 15.0 g/dL Final   Comment: REPEATED TO VERIFY CRITICAL RESULT CALLED TO, READ BACK BY AND VERIFIED WITH: WANDA OSBORNE RN.@[email or web address]  ON 7.1.17 BY TCALDWELL MT   . HCT 08/12/2015 18.0* 36.0 - 46.0 % Final  . MCV 08/12/2015 87.4  78.0 - 100.0 fL Final  . MCH 08/12/2015 32.0  26.0 - 34.0 pg Final  . MCHC 08/12/2015 36.7* 30.0 - 36.0 g/dL Final  . RDW 08/12/2015 13.8  11.5 - 15.5 % Final  . Platelets 08/12/2015 235  150 - 400 K/uL Final  . Order Confirmation 08/12/2015 ORDER PROCESSED BY BLOOD BANK   Final  . Glucose-Capillary 08/12/2015 100* 65 - 99 mg/dL Final  . Comment 1 08/12/2015 Notify RN   Final  . Glucose-Capillary 08/12/2015 101*  65 - 99 mg/dL Final  . Comment 1 08/12/2015 Notify RN   Final  . Hemoglobin 08/12/2015 8.9* 12.0 - 15.0 g/dL Final   Comment: REPEATED TO VERIFY DELTA CHECK NOTED POST TRANSFUSION SPECIMEN   . HCT 08/12/2015 24.6* 36.0 - 46.0 % Final  . Glucose-Capillary 08/12/2015 158* 65 - 99 mg/dL Final  . Comment 1 08/12/2015 Notify RN   Final    Physical Examination:  BP 128/82 mmHg  Pulse 89  Ht 5\' 5"  (1.651 m)  Wt 176 lb (79.833 kg)  BMI 29.29 kg/m2  SpO2 97%  LMP 04/03/2015  GENERAL: Averagely built and nourished HEENT:         Eye exam shows normal external appearance. Fundus exam shows a few scattered exudates, no obvious hemorrhages  Oral exam shows normal mucosa .  NECK:         there is no lymphadenopathy.   Thyroid is not enlarged and no nodules felt.   LUNGS:         Chest is symmetrical. Lungs are clear to auscultation.Marland Kitchen   HEART:         Heart sounds:  S1 and S2 are normal. No murmurs or clicks heard., no S3 or S4.   ABDOMEN:  no distention present. Liver and spleen are not palpable. No other mass or tenderness present.  EXTREMITIES:     There is no edema. No skin lesions present.Marland Kitchen  NEUROLOGICAL:        Vibration sense is mildly reduced in toes. Ankle jerks are normal bilaterally.  Diabetic Foot Exam - Simple   Simple Foot Form  Diabetic Foot exam was performed with the following findings:  Yes   Visual Inspection  No deformities, no ulcerations, no other skin breakdown bilaterally:  Yes  Sensation Testing  Intact to touch and monofilament testing bilaterally:  Yes  Pulse Check  Posterior Tibialis and Dorsalis pulse intact bilaterally:  Yes  Comments          MUSCULOSKELETAL:       There is no enlargement or deformity of the joints.  SKIN:       No rash, lesions or abnormal pigmentation       ASSESSMENT:  Diabetes type 1,Poorly controlled Although her A1c is 6.2 her blood sugars are poorly controlled with tendency to hypoglycemia frequently Currently on NPH  and Humalog insulin She does appear to be doing fairly well with carbohydrate counting to assess her mealtime insulin requirement  Problems identified:  Variable blood sugars at all times  Inadequate control of fasting glucose with current NPH at bedtime along with occasional nocturnal hypoglycemia  Tendency to frequent hypoglycemia during the day probably related to use of the NPH in the morning along with inadequate protein intake at times especially breakfast  Tendency to relatively severe hypoglycemia occasionally although has not had lost consciousness for this  Difficult to assess carbohydrate coverage at meals that she does not have many postprandial readings on her monitor.  Currently using a Generic monitor which cannot be downloaded today  Probably getting excessive correction of high readings and also not reducing insulin when blood sugars are low normal before meals  Complications: Retinopathy, nephropathy  PLAN:    Switch NPH to Levemir insulin twice a day as below.  Patient was instructed on adjustment of Levemir insulin dose based on fasting blood sugar.  Also discussed how Levemir insulin is different from NPH with more consistent basal insulin action, less fluctuation and hypoglycemia and to provide ease of control of fasting blood sugar.  Will also be able to adjust mealtime insulin more accurately without defect of NPH peak during the day  Glucose target in the morning to be 90 or less  For now continue carbohydrate coverage 1:10 but this probably will need to be modified based on her postprandial blood sugars which will be analyzed on her download on the next visit  Start using Contour glucose meter  Discussed appropriate treatment of hypoglycemia, she can keep glucose gel and he and also will prescribe glucagon for mother to use if needed  Consultation with diabetes educator as soon as possible  Correction factor 1: 50 for now  Follow-up in 3  weeks  Balanced meals with some protein at each meal and also with snacks especially at bedtime  Continue having a carbohydrate snack for exercise  Patient Instructions   LEVEMIR insulin: Starts with 8 units morning and bedtime Increase the doses by 1 unit twice a day based on the fasting blood sugar every 3 days until morning sugars are consistently below 90  NOVOLOG or Humalog: Continue 1 unit per 10 g carbohydrate at breakfast and supper   Correction factor: Increased insulin by 1 unit for every 50 points over 100 If blood sugar is below 80 reduce the dose by 1 unit  Continue checking blood sugars before and after meals and somewhat bedtime.  Mark readings after meals on the meter    Counseling time on subjects discussed above is over 50% of today's 60 minute visit   Christine Reed 08/16/2015,  1:09 PM   Note: This note was prepared with Estate agent. Any transcriptional errors that result from this process are unintentional.

## 2015-08-16 NOTE — Patient Instructions (Addendum)
LEVEMIR insulin: Starts with 8 units morning and bedtime Increase the doses by 1 unit twice a day based on the fasting blood sugar every 3 days until morning sugars are consistently below 90  NOVOLOG or Humalog: Continue 1 unit per 10 g carbohydrate at breakfast and supper   Correction factor: Increased insulin by 1 unit for every 50 points over 100 If blood sugar is below 80 reduce the dose by 1 unit  Continue checking blood sugars before and after meals and somewhat bedtime.  Mark readings after meals on the meter

## 2015-08-17 ENCOUNTER — Telehealth: Payer: Self-pay | Admitting: Endocrinology

## 2015-08-17 ENCOUNTER — Other Ambulatory Visit: Payer: Self-pay

## 2015-08-17 MED ORDER — INSULIN DETEMIR 100 UNIT/ML FLEXPEN: PEN_INJECTOR | SUBCUTANEOUS | Status: DC

## 2015-08-17 MED ORDER — GLUCAGON (RDNA) 1 MG IJ KIT: 1.0000 mg | PACK | Freq: Once | INTRAMUSCULAR | Status: DC | PRN

## 2015-08-17 MED ORDER — GLUCOSE BLOOD VI STRP: ORAL_STRIP | Status: DC

## 2015-08-17 MED ORDER — BAYER CONTOUR NEXT MONITOR W/DEVICE KIT: PACK | Status: DC

## 2015-08-17 NOTE — Telephone Encounter (Signed)
I contacted the pt and left a vm advising glucagon can be administered sq.

## 2015-08-17 NOTE — Telephone Encounter (Signed)
Patient can't do Intravenous, with medication  glucagon (GLUCAGON EMERGENCY) 1 MG injection, wondering if she could do IM or subcutaneus

## 2015-08-18 ENCOUNTER — Encounter: Payer: Self-pay | Admitting: *Deleted

## 2015-08-28 ENCOUNTER — Ambulatory Visit (INDEPENDENT_AMBULATORY_CARE_PROVIDER_SITE_OTHER): Payer: BLUE CROSS/BLUE SHIELD | Admitting: Obstetrics and Gynecology

## 2015-08-28 ENCOUNTER — Encounter: Payer: Self-pay | Admitting: Clinical

## 2015-08-28 VITALS — BP 140/90 | HR 90 | Wt 171.0 lb

## 2015-08-28 DIAGNOSIS — O24012 Pre-existing diabetes mellitus, type 1, in pregnancy, second trimester: Secondary | ICD-10-CM

## 2015-08-28 DIAGNOSIS — O0992 Supervision of high risk pregnancy, unspecified, second trimester: Secondary | ICD-10-CM

## 2015-08-28 DIAGNOSIS — E1021 Type 1 diabetes mellitus with diabetic nephropathy: Secondary | ICD-10-CM

## 2015-08-28 LAB — POCT URINALYSIS DIP (DEVICE)
Bilirubin Urine: NEGATIVE
Glucose, UA: NEGATIVE mg/dL
Ketones, ur: NEGATIVE mg/dL
Leukocytes, UA: NEGATIVE
Nitrite: NEGATIVE
Protein, ur: >=300 mg/dL — AB
Specific Gravity, Urine: 1.02 (ref 1.005–1.030)
Urobilinogen, UA: 0.2 mg/dL (ref 0.0–1.0)
pH: 6 (ref 5.0–8.0)

## 2015-08-28 NOTE — Progress Notes (Signed)
Subjective:  Christine Reed is a 30 y.o. G1P0 at [redacted]w[redacted]d being seen today for ongoing prenatal care.  She is currently monitored for the following issues for this high-risk pregnancy and has T1DM - Type F; Supervision of high risk pregnancy, antepartum; Anemia, chronic disease; Chronic hypertension during pregnancy, antepartum; Diabetic retinopathy ([place]); Diabetic nephropathy (HCC); CKD (chronic kidney disease) stage 3, GFR 30-59 ml/min; and Anemia affecting pregnancy in second trimester, antepartum on her problem list.  Patient reports no complaints.  Contractions: Not present. Vag. Bleeding: None.  Movement: Present. Denies leaking of fluid.   The following portions of the patient's history were reviewed and updated as appropriate: allergies, current medications, past family history, past medical history, past social history, past surgical history and problem list. Problem list updated.  Objective:   Filed Vitals:   08/28/15 1151 08/28/15 1152  BP: 138/93 140/90  Pulse: 90   Weight: 171 lb (77.565 kg)     Fetal Status: Fetal Heart Rate (bpm): 145   Movement: Present     General:  Alert, oriented and cooperative. Patient is in no acute distress.  Skin: Skin is warm and dry. No rash noted.   Cardiovascular: Normal heart rate noted  Respiratory: Normal respiratory effort, no problems with respiration noted  Abdomen: Soft, gravid, appropriate for gestational age. Pain/Pressure: Present     Pelvic:  Cervical exam deferred        Extremities: Normal range of motion.  Edema: Trace  Mental Status: Normal mood and affect. Normal behavior. Normal judgment and thought content.   Urinalysis: Urine Protein: 3+ Urine Glucose: Negative  Assessment and Plan:  Pregnancy: G1P0 at [redacted]w[redacted]d  1. Supervision of high risk pregnancy in second trimester - [place] MFM OB FOLLOW UP; Future - echo scheduled  2. Diabetic nephropathy associated with type 1 diabetes mellitus ([place]) - followed by nephrology  3. t1dm -  glucose appropriate, followed by endocrine, on levemir and short-acting  4. Anemia - s/p transfusion 2 wks ago, followed by nephro, considering   5. chtn - bp appropriate, not on meds  Preterm labor symptoms and general obstetric precautions including but not limited to vaginal bleeding, contractions, leaking of fluid and fetal movement were reviewed in detail with the patient. Please refer to After Visit Summary for other counseling recommendations.  F/u 2 wks   Gwynne Edinger, MD

## 2015-08-28 NOTE — Progress Notes (Signed)
Urine: moderate blood Pt c/o stomach pain and bilateral arm pain

## 2015-09-01 MED FILL — FAMOTIDINE 20 MG TABLET: 20 | 30 days supply | Qty: 60 | Fill #2

## 2015-09-01 MED FILL — HUMALOG 100 UNITS/ML KWIKPE: 100 | 45 days supply | Qty: 15 | Fill #1

## 2015-09-07 ENCOUNTER — Ambulatory Visit (INDEPENDENT_AMBULATORY_CARE_PROVIDER_SITE_OTHER): Payer: BLUE CROSS/BLUE SHIELD | Admitting: Endocrinology

## 2015-09-07 ENCOUNTER — Encounter: Payer: Self-pay | Admitting: Endocrinology

## 2015-09-07 VITALS — BP 140/82 | HR 91 | Wt 178.0 lb

## 2015-09-07 DIAGNOSIS — E1065 Type 1 diabetes mellitus with hyperglycemia: Secondary | ICD-10-CM

## 2015-09-07 NOTE — Progress Notes (Signed)
Patient ID: Christine Reed, female   DOB: 01/20/1986, 30 y.o.   MRN: [id number]           Reason for Appointment : Follow-up for Type 1 Diabetes  History of Present Illness          Diagnosis: Type 1 diabetes mellitus, date of diagnosis: 2000         Previous history:   She was initially started on NPH and Regular Insulin and subsequently was on Lantus and Humalog Usually has had poor control but records are not available for the last few years from her previous endocrinologist In April her Lantus was changed Novolin N presumably because of tendency to low sugars during the night  Recent history:   INSULIN regimen is: Humalog at meals carbohydrate coverage 1:10, correction factor 1 units per 50 mg over 150 Levemir 9 units at 8 am and 9 pm  Current management, blood sugar patterns and problems identified:    She was switched from NPH to Levemir insulin on her initial consultation on 08/16/15  FASTING blood sugars are much less variable since then although almost consistently over 90  She has not adjusted her Levemir in the evening based on fasting readings as suggested on the last visit  Although she is not very active during the day she has a tendency to low blood sugars between about 10 AM-3 PM  She is not getting any protein to her breakfast and only eating a carbohydrate meal  She has sporadic high readings in the mornings before and after breakfast and sometimes after lunch or dinner and not clear why  Some of her high readings are rebound from a low sugar  Has not had any excessive overcorrection of high readings after changing her correction factor to 1:15  She has had a couple of significant episodes of low blood sugars, last night when she took 2 units for her 30 g carbohydrate intake which was after her dance class and pre-meal blood sugar of only 79  Glucose monitoring:  is being done 3-5 times a day         Glucometer:  Contour Blood Glucose readings from meter  review of download : Mean values apply above for all meters except median for One Touch  PRE-MEAL Fasting Lunch Dinner Bedtime Overall  Glucose range: 66-365  42-160  33-264  39-399    Mean/median: 149    135  122    Hypoglycemia:  occurs at Various times as above Factors causing hyperglycemia:Excessive insulin Symptoms of hypoglycemia:Weakness Treatment of hypoglycemia:As above          Self-care: The diet that the patient has been following is: Carbohydrate counting Usually eating cream of wheat at breakfast, mostly salad at lunch and dinner is usually at 8 PM with fish, rice and broccoli Snacks usually are apple or crackers          Exercise:  one hour dance lessons in the evenings after supper.  Usually having.  Fruit before going for exercise and no hypoglycemia with exercise          Dietician consultation: Most recent: 5/17 in the hospital .         CDE consultation:?  Diabetes labs:  Lab Results  Component Value Date   HGBA1C 6.2 (H) 07/17/2015   HGBA1C 12.1 02/18/2012   HGBA1C (H) 08/07/2007    10.1 (NOTE)   The ADA recommends the following therapeutic goal for glycemic   control related to Hgb  A1C measurement:   Goal of Therapy:   < 7.0% Hgb A1C   Reference: American Diabetes Association: Clinical Practice   Recommendations 2008, Diabetes Care,  2008, 31:(Suppl 1).   Lab Results  Component Value Date   LDLCALC 131 (H) 06/27/2015   CREATININE 1.60 (H) 07/12/2015   Microalbumin ratio 5011 done in 2/17  No results found for: [facility]     Medication List       Accurate as of 09/07/15  4:27 PM. Always use your most recent med list.          acetaminophen 500 MG tablet Commonly known as:  TYLENOL Take 500 mg by mouth every 6 (six) hours as needed for mild pain or headache.   aspirin 81 MG chewable tablet Chew 81 mg by mouth daily.   BAYER CONTOUR NEXT MONITOR w/Device Kit Use to check blood sugar 6 times per day.   diphenhydrAMINE 25 MG  tablet Commonly known as:  BENADRYL Take 25 mg by mouth every 6 (six) hours as needed for itching or allergies.   enoxaparin 40 MG/0.4ML injection Commonly known as:  LOVENOX Inject 40 mg into the skin at bedtime.   famotidine 20 MG tablet Commonly known as:  PEPCID Take 1 tablet (20 mg total) by mouth 2 (two) times daily.   glucagon 1 MG injection Commonly known as:  GLUCAGON EMERGENCY Inject 1 mg into the vein once as needed.   glucose blood test strip Commonly known as:  BAYER CONTOUR NEXT TEST Use to check blood sugar 6 times per day.   HUMALOG KWIKPEN 100 UNIT/ML KiwkPen Generic drug:  insulin lispro Inject 2-11 Units into the skin 3 (three) times daily with meals. Pt uses per sliding scale.   Insulin Detemir 100 UNIT/ML Pen Commonly known as:  LEVEMIR Inject 8 units twice daily.   multivitamin-prenatal 27-0.8 MG Tabs tablet Take 1 tablet by mouth daily.       Allergies:  Allergies  Allergen Reactions  . Iodine Rash  . Nickel Rash    Past Medical History:  Diagnosis Date  . Diabetes mellitus    diagnosed at age 21  . Hypertension     Past Surgical History:  Procedure Laterality Date  . lipo suction  2015  . TONSILLECTOMY    . TONSILLECTOMY AND ADENOIDECTOMY      Family History  Problem Relation Age of Onset  . Cancer Maternal Grandmother     colon and breast  . Diabetes Maternal Grandfather   . Diabetes Mother   . Diabetes Father   . Diabetes Paternal Grandmother   . Diabetes Paternal Grandfather     Social History:  reports that she has never smoked. She has never used smokeless tobacco. She reports that she does not drink alcohol or use drugs.      Review of Systems     LABS:  No visits with results within 1 Week(s) from this visit.  Latest known visit with results is:  Routine Prenatal on 08/28/2015  Component Date Value Ref Range Status  . Glucose, UA 08/28/2015 NEGATIVE  NEGATIVE mg/dL Final  . Bilirubin Urine 08/28/2015  NEGATIVE  NEGATIVE Final  . Ketones, ur 08/28/2015 NEGATIVE  NEGATIVE mg/dL Final  . Specific Gravity, Urine 08/28/2015 1.020  1.005 - 1.030 Final  . Hgb urine dipstick 08/28/2015 MODERATE* NEGATIVE Final  . pH 08/28/2015 6.0  5.0 - 8.0 Final  . Protein, ur 08/28/2015 >=300* NEGATIVE mg/dL Final  . Urobilinogen, UA 08/28/2015 0.2  0.0 -  1.0 mg/dL Final  . Nitrite 08/28/2015 NEGATIVE  NEGATIVE Final  . Leukocytes, UA 08/28/2015 NEGATIVE  NEGATIVE Final    Physical Examination:  LMP 04/03/2015   ASSESSMENT:  Diabetes type 1,Poorly controlled Although her A1c is 6.2 her blood sugars are poorly controlled with tendency to hypoglycemia frequently Currently on NPH and Humalog insulin She does appear to be doing fairly well with carbohydrate counting to assess her mealtime insulin requirement  Problems identified:  Not getting any protein at breakfast  Fasting blood sugars are still above target of 60-90  Blood sugars are tending to be lower during the day before lunch and supper  Occasional tendency to hypoglycemia late at night and relatively low readings overall recently after evening meal.  Some hypoglycemia is when she is more active in the evening with her dance class  Periodically he still has high readings at various times, some of them are rebound from low sugar    PLAN:    Switch doses of Levemir to 7 in the morning and 11 in the evening  May need to adjust evening dose further based on morning readings  Current back to 1:15, carbohydrate ratio at suppertime  May skip suppertime insulin in the evenings after dance lessons if blood sugar is low normal  Follow-up in 3 weeks  Balanced meals with some protein at each meal and also with snacks especially at breakfast, she thinks she can eat of lean meat in the morning  Try having a carbohydrate snack for exercise  Recommend consultation with nurse educator also  There are no Patient Instructions on file for this  visit.  Counseling time on subjects discussed above is over 50% of today's 25 minute visit   Vyron Fronczak 09/07/2015, 4:27 PM   Note: This note was prepared with Dragon voice recognition system technology. Any transcriptional errors that result from this process are unintentional.

## 2015-09-07 NOTE — Patient Instructions (Addendum)
Levemir 7 units am an 11 in pm  Add a protein  In am  Dinner carb ratio 1:15

## 2015-09-08 ENCOUNTER — Other Ambulatory Visit (HOSPITAL_COMMUNITY): Payer: Self-pay

## 2015-09-11 ENCOUNTER — Encounter (HOSPITAL_COMMUNITY)
Admission: RE | Admit: 2015-09-11 | Discharge: 2015-09-11 | Disposition: A | Discharge status: Home / Self Care | Payer: BLUE CROSS/BLUE SHIELD | Source: Ambulatory Visit | Attending: Nephrology | Admitting: Nephrology

## 2015-09-11 ENCOUNTER — Observation Stay (HOSPITAL_COMMUNITY)
Admission: AD | Admit: 2015-09-11 | Discharge: 2015-09-12 | DRG: 781 | Disposition: A | Discharge status: Home / Self Care | Payer: BLUE CROSS/BLUE SHIELD | Source: Ambulatory Visit | Attending: Obstetrics and Gynecology | Admitting: Obstetrics and Gynecology

## 2015-09-11 ENCOUNTER — Encounter (HOSPITAL_COMMUNITY): Payer: Self-pay | Admitting: Anesthesiology

## 2015-09-11 ENCOUNTER — Telehealth: Payer: Self-pay

## 2015-09-11 ENCOUNTER — Encounter (HOSPITAL_COMMUNITY): Payer: Self-pay

## 2015-09-11 DIAGNOSIS — Z794 Long term (current) use of insulin: Secondary | ICD-10-CM

## 2015-09-11 DIAGNOSIS — E109 Type 1 diabetes mellitus without complications: Secondary | ICD-10-CM

## 2015-09-11 DIAGNOSIS — Z3A22 22 weeks gestation of pregnancy: Secondary | ICD-10-CM

## 2015-09-11 DIAGNOSIS — N183 Chronic kidney disease, stage 3 unspecified: Secondary | ICD-10-CM

## 2015-09-11 DIAGNOSIS — O162 Unspecified maternal hypertension, second trimester: Secondary | ICD-10-CM

## 2015-09-11 DIAGNOSIS — O10912 Unspecified pre-existing hypertension complicating pregnancy, second trimester: Secondary | ICD-10-CM | POA: Diagnosis present

## 2015-09-11 DIAGNOSIS — D638 Anemia in other chronic diseases classified elsewhere: Secondary | ICD-10-CM

## 2015-09-11 DIAGNOSIS — D649 Anemia, unspecified: Secondary | ICD-10-CM | POA: Diagnosis not present

## 2015-09-11 DIAGNOSIS — O24012 Pre-existing diabetes mellitus, type 1, in pregnancy, second trimester: Secondary | ICD-10-CM

## 2015-09-11 DIAGNOSIS — Z809 Family history of malignant neoplasm, unspecified: Secondary | ICD-10-CM

## 2015-09-11 DIAGNOSIS — O24919 Unspecified diabetes mellitus in pregnancy, unspecified trimester: Secondary | ICD-10-CM

## 2015-09-11 DIAGNOSIS — E10319 Type 1 diabetes mellitus with unspecified diabetic retinopathy without macular edema: Secondary | ICD-10-CM | POA: Diagnosis present

## 2015-09-11 DIAGNOSIS — Z9889 Other specified postprocedural states: Secondary | ICD-10-CM | POA: Diagnosis not present

## 2015-09-11 DIAGNOSIS — R809 Proteinuria, unspecified: Secondary | ICD-10-CM | POA: Diagnosis not present

## 2015-09-11 DIAGNOSIS — Z833 Family history of diabetes mellitus: Secondary | ICD-10-CM | POA: Diagnosis not present

## 2015-09-11 DIAGNOSIS — E1021 Type 1 diabetes mellitus with diabetic nephropathy: Secondary | ICD-10-CM

## 2015-09-11 DIAGNOSIS — O99012 Anemia complicating pregnancy, second trimester: Principal | ICD-10-CM

## 2015-09-11 DIAGNOSIS — O0990 Supervision of high risk pregnancy, unspecified, unspecified trimester: Secondary | ICD-10-CM

## 2015-09-11 DIAGNOSIS — O10919 Unspecified pre-existing hypertension complicating pregnancy, unspecified trimester: Secondary | ICD-10-CM

## 2015-09-11 LAB — CBC
HCT: 22.3 % — ABNORMAL LOW (ref 36.0–46.0)
Hemoglobin: 7.8 g/dL — ABNORMAL LOW (ref 12.0–15.0)
MCH: 30.6 pg (ref 26.0–34.0)
MCHC: 35 g/dL (ref 30.0–36.0)
MCV: 87.5 fL (ref 78.0–100.0)
PLATELETS: 229 10*3/uL (ref 150–400)
RBC: 2.55 MIL/uL — AB (ref 3.87–5.11)
RDW: 12.4 % (ref 11.5–15.5)
WBC: 10.6 10*3/uL — ABNORMAL HIGH (ref 4.0–10.5)

## 2015-09-11 LAB — PREPARE RBC (CROSSMATCH)

## 2015-09-11 MED ORDER — INSULIN DETEMIR 100 UNIT/ML ~~LOC~~ SOLN
11.0000 [IU] | Freq: Every day | SUBCUTANEOUS | Status: DC
  Administered 2015-09-12: 7 [IU] via SUBCUTANEOUS
  Filled 2015-09-11: qty 0.11

## 2015-09-11 MED ORDER — LIDOCAINE HCL 1 % IJ SOLN
INTRAMUSCULAR | Status: AC
Start: 2015-09-11 — End: 2015-09-11
  Filled 2015-09-11: qty 20

## 2015-09-11 MED ORDER — CALCIUM CARBONATE ANTACID 500 MG PO CHEW: 2.0000 | CHEWABLE_TABLET | ORAL | Status: DC | PRN

## 2015-09-11 MED ORDER — DOCUSATE SODIUM 100 MG PO CAPS: 100.0000 mg | ORAL_CAPSULE | Freq: Every day | ORAL | Status: DC

## 2015-09-11 MED ORDER — ACETAMINOPHEN 325 MG PO TABS
650.0000 mg | ORAL_TABLET | Freq: Once | ORAL | Status: AC
  Administered 2015-09-11: 650 mg via ORAL
  Filled 2015-09-11: qty 2

## 2015-09-11 MED ORDER — FAMOTIDINE 20 MG PO TABS
20.0000 mg | ORAL_TABLET | Freq: Two times a day (BID) | ORAL | Status: DC
  Administered 2015-09-11: 20 mg via ORAL
  Filled 2015-09-11: qty 1

## 2015-09-11 MED ORDER — EPOETIN ALFA 20000 UNIT/ML IJ SOLN
20000.0000 [IU] | INTRAMUSCULAR | Status: DC
  Administered 2015-09-11: 20000 [IU] via SUBCUTANEOUS

## 2015-09-11 MED ORDER — DIPHENHYDRAMINE HCL 25 MG PO CAPS
25.0000 mg | ORAL_CAPSULE | Freq: Once | ORAL | Status: AC
  Administered 2015-09-11: 25 mg via ORAL
  Filled 2015-09-11: qty 1

## 2015-09-11 MED ORDER — SODIUM CHLORIDE 0.9 % IV SOLN
Freq: Once | INTRAVENOUS | Status: AC
  Administered 2015-09-11: 23:00:00 via INTRAVENOUS

## 2015-09-11 MED ORDER — ASPIRIN 81 MG PO CHEW
81.0000 mg | CHEWABLE_TABLET | Freq: Every day | ORAL | Status: DC
  Filled 2015-09-11 (×2): qty 1

## 2015-09-11 MED ORDER — ACETAMINOPHEN 325 MG PO TABS
650.0000 mg | ORAL_TABLET | ORAL | Status: DC | PRN
Start: 2015-09-11 — End: 2015-09-12

## 2015-09-11 MED ORDER — EPOETIN ALFA 20000 UNIT/ML IJ SOLN
INTRAMUSCULAR | Status: AC
  Filled 2015-09-11: qty 1

## 2015-09-11 MED ORDER — PRENATAL MULTIVITAMIN CH: 1.0000 | ORAL_TABLET | Freq: Every day | ORAL | Status: DC

## 2015-09-11 MED ORDER — INSULIN DETEMIR 100 UNIT/ML ~~LOC~~ SOLN
7.0000 [IU] | Freq: Every day | SUBCUTANEOUS | Status: DC
  Filled 2015-09-11: qty 0.07

## 2015-09-11 MED ORDER — INSULIN DETEMIR 100 UNIT/ML ~~LOC~~ SOLN
9.0000 [IU] | Freq: Two times a day (BID) | SUBCUTANEOUS | Status: DC
  Filled 2015-09-11 (×2): qty 0.09

## 2015-09-11 MED ORDER — ENOXAPARIN SODIUM 40 MG/0.4ML ~~LOC~~ SOLN: 40.0000 mg | Freq: Every day | SUBCUTANEOUS | Status: DC

## 2015-09-11 MED ORDER — ENOXAPARIN SODIUM 40 MG/0.4ML ~~LOC~~ SOLN
40.0000 mg | Freq: Every day | SUBCUTANEOUS | Status: DC
  Administered 2015-09-12: 40 mg via SUBCUTANEOUS
  Filled 2015-09-11: qty 0.4

## 2015-09-11 NOTE — Telephone Encounter (Signed)
Received call from [facility] regarding this patient hemoglobin 7.8. They would like to know if patient should get another transfusion since her iron level are 7.8. I have discuss with the provider who has reviewed the chart and at this time patient does not need another transfusion.Patient is scheduled to come in on 09/19/2015. This will be address at this time.

## 2015-09-11 NOTE — Telephone Encounter (Signed)
Returned call to patient in regards to patient symptoms patient stated she is having blurred vision and feeling very fatigue at this time. Per Dr.Mumuw patient should go ahead and get 1-2 units of blood. She has been instructed to go directly at this time.

## 2015-09-11 NOTE — Progress Notes (Signed)
Patient states she is [redacted] weeks pregnant and wants to be sure it is ok to receive Procrit today. States that she was told that Dr. Marval Regal would speak with md at the high risk clinic that she goes to. I called [facility] and spoke with Crystal, who in turn checked with Dr. Jimmy Footman. (Dr. Marval Regal out of office) and with Dr. Lanell Matar at the high risk clinic. Ok to give Procrit as ordered. Patient made aware and verbalizes understanding.

## 2015-09-11 NOTE — MAU Note (Signed)
Report called to [facility] unit regarding pt. RN receiving pt unable to take her right now. Pt to sit in lobby until able to go upstairs to room 306

## 2015-09-11 NOTE — H&P (Signed)
Christine Reed is a 30 y.o. female G1P0 at [redacted]w[redacted]d presenting for blood transfusion. Patient is being followed at [facility] at [facility] hospital. Prenatal care complicated by Type I DM- class F, retinopathy, CHTN and severe anemia. This is her second admission for blood transfusion during this pregnancy. She is also being followed by nephrology team and received a dose of epo this morning. Patient reports feeling generalized fatigue, feeling dizzy at times over the past week. She denies any chest pain or shortness of breath. She reports good fetal movement. She denies contractions, vaginal bleeding or leakage of fluid.  OB History    Gravida Para Term Preterm AB Living   1             SAB TAB Ectopic Multiple Live Births                 Past Medical History:  Diagnosis Date  . Diabetes mellitus    diagnosed at age 32  . Hypertension    Past Surgical History:  Procedure Laterality Date  . lipo suction  2015  . TONSILLECTOMY    . TONSILLECTOMY AND ADENOIDECTOMY     Family History: family history includes Cancer in her maternal grandmother; Diabetes in her father, maternal grandfather, mother, paternal grandfather, and paternal grandmother. Social History:  reports that she has never smoked. She has never used smokeless tobacco. She reports that she does not drink alcohol or use drugs.     Maternal Diabetes: Yes:  Diabetes Type:  Pre-pregnancy Genetic Screening: Normal Maternal Ultrasounds/Referrals: Normal Fetal Ultrasounds or other Referrals:  None Maternal Substance Abuse:  No Significant Maternal Medications:  None Significant Maternal Lab Results:  None Other Comments:  None  ROS History   Blood pressure 133/85, pulse 92, temperature 99.5 F (37.5 C), temperature source Oral, resp. rate 18, last menstrual period 04/03/2015, SpO2 100 %. Exam Physical Exam  GENERAL: Well-developed, well-nourished female in no acute distress.  HEENT: Normocephalic, atraumatic. Sclerae anicteric.   NECK: Supple. Normal thyroid.  LUNGS: Clear to auscultation bilaterally.  HEART: Regular rate and rhythm. ABDOMEN: Soft, nontender, gravid PELVIC: Not indicated EXTREMITIES: No cyanosis, clubbing, or edema, 2+ distal pulses.  Prenatal labs: ABO, Rh: --/--/O POS (07/01 [id number]) Antibody: NEG (07/01 [id number]) Rubella: Immune (05/03 0000) RPR: Nonreactive (05/03 0000)  HBsAg: Negative (05/03 0000)  HIV: Non-reactive (05/03 0000)  GBS:     Assessment/Plan: 30 yo G1P0 at [redacted]w[redacted]d with symptomatic anemia 1) Anemia - Will transfuse 2 units pRBC  2) Type I DM - Will restart current home regimen and monitor CBG - Will continue lovenox for prophylaxis secondary to severe proteinuria   3) CHTN - Will monitor BP and start methyldopa as recommended if needed  4) Fetal status - Will obtain fetal dopplers q shift - Routine antepartum care  Christine Reed 09/11/2015, 8:53 PM

## 2015-09-11 NOTE — MAU Note (Signed)
Pt to be a direct admit to women's unit

## 2015-09-11 NOTE — MAU Note (Signed)
Pt presents stating she is here for a blood transfusion. States her hemoglobin was 7 today and they sent her here for a blood transfusion. States she is having visual changes and dizziness and feeling off balance today. Denies pain. Denies vaginal bleeding or discharge. Reports good fetal movement.

## 2015-09-11 NOTE — Discharge Instructions (Signed)
Epoetin Alfa injection What is this medicine? EPOETIN ALFA (e POE e tin AL fa) helps your body make more red blood cells. This medicine is used to treat anemia caused by chronic kidney failure, cancer chemotherapy, or HIV-therapy. It may also be used before surgery if you have anemia. This medicine may be used for other purposes; ask your health care provider or pharmacist if you have questions. What should I tell my health care provider before I take this medicine? They need to know if you have any of these conditions: -blood clotting disorders -cancer patient not on chemotherapy -cystic fibrosis -heart disease, such as angina or heart failure -hemoglobin level of 12 g/dL or greater -high blood pressure -low levels of folate, iron, or vitamin B12 -seizures -an unusual or allergic reaction to erythropoietin, albumin, benzyl alcohol, hamster proteins, other medicines, foods, dyes, or preservatives -pregnant or trying to get pregnant -breast-feeding How should I use this medicine? This medicine is for injection into a vein or under the skin. It is usually given by a health care professional in a hospital or clinic setting. If you get this medicine at home, you will be taught how to prepare and give this medicine. Use exactly as directed. Take your medicine at regular intervals. Do not take your medicine more often than directed. It is important that you put your used needles and syringes in a special sharps container. Do not put them in a trash can. If you do not have a sharps container, call your pharmacist or healthcare provider to get one. Talk to your pediatrician regarding the use of this medicine in children. While this drug may be prescribed for selected conditions, precautions do apply. Overdosage: If you think you have taken too much of this medicine contact a poison control center or emergency room at once. NOTE: This medicine is only for you. Do not share this medicine with  others. What if I miss a dose? If you miss a dose, take it as soon as you can. If it is almost time for your next dose, take only that dose. Do not take double or extra doses. What may interact with this medicine? Do not take this medicine with any of the following medications: -darbepoetin alfa This list may not describe all possible interactions. Give your health care provider a list of all the medicines, herbs, non-prescription drugs, or dietary supplements you use. Also tell them if you smoke, drink alcohol, or use illegal drugs. Some items may interact with your medicine. What should I watch for while using this medicine? Visit your prescriber or health care professional for regular checks on your progress and for the needed blood tests and blood pressure measurements. It is especially important for the doctor to make sure your hemoglobin level is in the desired range, to limit the risk of potential side effects and to give you the best benefit. Keep all appointments for any recommended tests. Check your blood pressure as directed. Ask your doctor what your blood pressure should be and when you should contact him or her. As your body makes more red blood cells, you may need to take iron, folic acid, or vitamin B supplements. Ask your doctor or health care provider which products are right for you. If you have kidney disease continue dietary restrictions, even though this medication can make you feel better. Talk with your doctor or health care professional about the foods you eat and the vitamins that you take. What side effects may I notice   from receiving this medicine? Side effects that you should report to your doctor or health care professional as soon as possible: -allergic reactions like skin rash, itching or hives, swelling of the face, lips, or tongue -breathing problems -changes in vision -chest pain -confusion, trouble speaking or understanding -feeling faint or lightheaded,  falls -high blood pressure -muscle aches or pains -pain, swelling, warmth in the leg -rapid weight gain -severe headaches -sudden numbness or weakness of the face, arm or leg -trouble walking, dizziness, loss of balance or coordination -seizures (convulsions) -swelling of the ankles, feet, hands -unusually weak or tired Side effects that usually do not require medical attention (report to your doctor or health care professional if they continue or are bothersome): -diarrhea -fever, chills (flu-like symptoms) -headaches -nausea, vomiting -redness, stinging, or swelling at site where injected This list may not describe all possible side effects. Call your doctor for medical advice about side effects. You may report side effects to FDA at 1-800-FDA-1088. Where should I keep my medicine? Keep out of the reach of children. Store in a refrigerator between 2 and 8 degrees C (36 and 46 degrees F). Do not freeze or shake. Throw away any unused portion if using a single-dose vial. Multi-dose vials can be kept in the refrigerator for up to 21 days after the initial dose. Throw away unused medicine. NOTE: This sheet is a summary. It may not cover all possible information. If you have questions about this medicine, talk to your doctor, pharmacist, or health care provider.    2016, Elsevier/Gold Standard. (2008-01-12 10:25:44)  

## 2015-09-12 DIAGNOSIS — O99012 Anemia complicating pregnancy, second trimester: Secondary | ICD-10-CM | POA: Diagnosis not present

## 2015-09-12 DIAGNOSIS — E109 Type 1 diabetes mellitus without complications: Secondary | ICD-10-CM | POA: Diagnosis not present

## 2015-09-12 DIAGNOSIS — D649 Anemia, unspecified: Secondary | ICD-10-CM | POA: Diagnosis not present

## 2015-09-12 DIAGNOSIS — O24012 Pre-existing diabetes mellitus, type 1, in pregnancy, second trimester: Secondary | ICD-10-CM | POA: Diagnosis not present

## 2015-09-12 LAB — CBC WITH DIFFERENTIAL/PLATELET
Basophils Absolute: 0 10*3/uL (ref 0.0–0.1)
Basophils Relative: 0 %
Eosinophils Absolute: 0.2 10*3/uL (ref 0.0–0.7)
Eosinophils Relative: 2 %
HEMATOCRIT: 25 % — AB (ref 36.0–46.0)
HEMOGLOBIN: 9.1 g/dL — AB (ref 12.0–15.0)
LYMPHS ABS: 2.6 10*3/uL (ref 0.7–4.0)
Lymphocytes Relative: 23 %
MCH: 30.2 pg (ref 26.0–34.0)
MCHC: 36.4 g/dL — AB (ref 30.0–36.0)
MCV: 83.1 fL (ref 78.0–100.0)
MONOS PCT: 3 %
Monocytes Absolute: 0.4 10*3/uL (ref 0.1–1.0)
NEUTROS ABS: 8.1 10*3/uL — AB (ref 1.7–7.7)
NEUTROS PCT: 72 %
Platelets: 205 10*3/uL (ref 150–400)
RBC: 3.01 MIL/uL — AB (ref 3.87–5.11)
RDW: 13.5 % (ref 11.5–15.5)
WBC: 11.3 10*3/uL — AB (ref 4.0–10.5)

## 2015-09-12 LAB — GLUCOSE, CAPILLARY: GLUCOSE-CAPILLARY: 132 mg/dL — AB (ref 65–99)

## 2015-09-12 NOTE — Progress Notes (Addendum)
Inpatient Diabetes Program Recommendations  AACE/ADA: New Consensus Statement on Inpatient Glycemic Control (2015)  Target Ranges:  Prepandial:   less than 140 mg/dL      Peak postprandial:   less than 180 mg/dL (1-2 hours)      Critically ill patients:  140 - 180 mg/dL   NO CBG MONITORING in chart since admitted  Review of Glycemic Control  Diabetes history: DM1 (absolutely insulin dependant)  Outpatient Diabetes medications: Per home medication list in chart- Levemir 7 units QAM, Levemir 11 units QHS, Humalog 2-11 units TID with meals Current orders for Inpatient glycemic control: Levemir 7 units QAM, Levemir 11 units QHS  Inpatient Diabetes Program Recommendations: Correction (SSI): Please use the Diabetic Pregnant Patient order set to order CBGs with Novolog correction (0-16 units) QID (fasting and 2 hour post prandial). Insulin - Meal Coverage: Please order Novolog 0-5 units TID with meals (using 1 unit for every 15 grams of carbohydrates). Diet: Please change diet to Gestational Carbohydrate Modified.  NOTE: Talked with Lavella Lemons, RN and asked that she discuss recommendations with MD.  Thanks, Barnie Alderman, RN, MSN, CDE Diabetes Coordinator Inpatient Diabetes Program [phone] (Team Pager from [place] to [place]) [phone] ([facility] office) [phone] [facility] office) [phone] [facility] office)

## 2015-09-12 NOTE — Progress Notes (Signed)
Pt is discharged in the care of husband,with N.T. Escort. Denies any pain or discomfort. Diabetic teaching continues with good understanding. Pt understands all discharged instructions well.States she feel better now.

## 2015-09-12 NOTE — Discharge Summary (Signed)
Physician Discharge Summary  Patient ID: Christine Reed MRN: [id number] DOB/AGE: 18-May-1985 30 y.o.  Admit date: 09/11/2015 Discharge date: 09/12/2015  Admission Diagnoses:Chronic anemia, symptomatic  Discharge Diagnoses: same Active Problems:   Symptomatic anemia  Patient Active Problem List   Diagnosis Date Noted  . Symptomatic anemia 09/11/2015  . Anemia affecting pregnancy in second trimester, antepartum 08/12/2015  . Diabetic nephropathy ([place]) 08/07/2015  . CKD (chronic kidney disease) stage 3, GFR 30-59 ml/min 08/07/2015  . Diabetic retinopathy ([place]) 07/24/2015  . Chronic hypertension during pregnancy, antepartum 07/22/2015  . Anemia, chronic disease 07/17/2015  . T1DM - Type F 06/16/2015  . Supervision of high risk pregnancy, antepartum 06/16/2015     Discharged Condition: good  Hospital Course: Christine Reed is a 30 y.o. female G1P0 at 3w2dpresenting for blood transfusion. [facility] Prenatal care complicated by Type I DM- class F, retinopathy, CHTN and severe anemia. This is her second admission for blood transfusion during this pregnancy. She is also being followed by nephrology team and received a dose of epo this morning. Patient reports feeling generalized fatigue, feeling dizzy at times over the past week. She denies any chest pain or shortness of breath. She reports good fetal movement. She denies contractions, vaginal bleeding or leakage of fluid.          OB History    Gravida Para Term Preterm AB Living   1             SAB TAB Ectopic Multiple Live Births                     Past Medical History:  Diagnosis Date  . Diabetes mellitus    diagnosed at age 30 . Hypertension         Past Surgical History:  Procedure Laterality Date  . lipo suction  2015  . TONSILLECTOMY    . TONSILLECTOMY AND ADENOIDECTOMY     Family History: family history includes Cancer in her maternal grandmother;  Diabetes in her father, maternal grandfather, mother, paternal grandfather, and paternal grandmother. Social History:  reports that she has never smoked. She has never used smokeless tobacco. She reports that she does not drink alcohol or use drugs.     Maternal Diabetes: Yes:  Diabetes Type:  Pre-pregnancy Genetic Screening: Normal Maternal Ultrasounds/Referrals: Normal Fetal Ultrasounds or other Referrals:  None Maternal Substance Abuse:  No Significant Maternal Medications:  None Significant Maternal Lab Results:  None Other Comments:  None  ROS History   Blood pressure 133/85, pulse 92, temperature 99.5 F (37.5 C), temperature source Oral, resp. rate 18, last menstrual period 04/03/2015, SpO2 100 %. Exam Physical Exam  GENERAL: Well-developed, well-nourished female in no acute distress.  HEENT: Normocephalic, atraumatic. Sclerae anicteric.  NECK: Supple. Normal thyroid.  LUNGS: Clear to auscultation bilaterally.  HEART: Regular rate and rhythm. ABDOMEN: Soft, nontender, gravid PELVIC: Not indicated EXTREMITIES: No cyanosis, clubbing, or edema, 2+ distal pulses.  Prenatal labs: ABO, Rh: --/--/O POS (07/01 02060 Antibody: NEG (07/01 01561 Rubella: Immune (05/03 0000) RPR: Nonreactive (05/03 0000)  HBsAg: Negative (05/03 0000)  HIV: Non-reactive (05/03 0000)  GBS:     Assessment/Plan: 30yo G1P0 at 220w2dith symptomatic anemia 1) Anemia - Will transfuse 2 units pRBC  2) Type I DM - Will restart current home regimen and monitor CBG - Will continue lovenox for prophylaxis secondary to severe proteinuria  3) CHTN - Will monitor BP and start methyldopa as recommended if needed  4) Fetal status - Will obtain fetal dopplers q shift - Routine antepartum care  CONSTANT,PEGGY 09/11/2015, 8:53 PM   Consults: None  Significant Diagnostic Studies: labs:  CBC    Component Value Date/Time   WBC 11.3 (H) 09/12/2015 0721   RBC 3.01 (L)  09/12/2015 0721   HGB 9.1 (L) 09/12/2015 0721   HCT 25.0 (L) 09/12/2015 0721   PLT 205 09/12/2015 0721   MCV 83.1 09/12/2015 0721   MCV 89.0 03/09/2012 2049   MCH 30.2 09/12/2015 0721   MCHC 36.4 (H) 09/12/2015 0721   RDW 13.5 09/12/2015 0721   LYMPHSABS 2.6 09/12/2015 0721   MONOABS 0.4 09/12/2015 0721   EOSABS 0.2 09/12/2015 0721   BASOSABS 0.0 09/12/2015 0721     Treatments: transfusion 2 units PRBC  Discharge Exam: Blood pressure 129/80, pulse 82, temperature 98.6 F (37 C), temperature source Oral, resp. rate 16, height '5\' 5"'  (1.651 m), weight 80.1 kg (176 lb 8 oz), last menstrual period 04/03/2015, SpO2 100 %. General appearance: alert, cooperative and no distress Extremities: extremities normal, atraumatic, no cyanosis or edema  Disposition: 01-Home or Self Care     Medication List    TAKE these medications   acetaminophen 500 MG tablet Commonly known as:  TYLENOL Take 500 mg by mouth every 6 (six) hours as needed for mild pain or headache.   aspirin 81 MG chewable tablet Chew 81 mg by mouth daily.   BAYER CONTOUR NEXT MONITOR w/Device Kit Use to check blood sugar 6 times per day.   diphenhydrAMINE 25 MG tablet Commonly known as:  BENADRYL Take 25 mg by mouth every 6 (six) hours as needed for itching or allergies.   enoxaparin 40 MG/0.4ML injection Commonly known as:  LOVENOX Inject 40 mg into the skin at bedtime.   famotidine 20 MG tablet Commonly known as:  PEPCID Take 1 tablet (20 mg total) by mouth 2 (two) times daily.   glucagon 1 MG injection Commonly known as:  GLUCAGON EMERGENCY Inject 1 mg into the vein once as needed.   glucose blood test strip Commonly known as:  BAYER CONTOUR NEXT TEST Use to check blood sugar 6 times per day.   HUMALOG KWIKPEN 100 UNIT/ML KiwkPen Generic drug:  insulin lispro Inject 2-11 Units into the skin 3 (three) times daily with meals. Pt uses per sliding scale.   Insulin Detemir 100 UNIT/ML Pen Commonly known  as:  LEVEMIR Inject 8 units twice daily. What changed:  how much to take  how to take this  when to take this  additional instructions   multivitamin-prenatal 27-0.8 MG Tabs tablet Take 1 tablet by mouth daily.        Signed: Teonna Coonan 09/12/2015, 8:50 AM

## 2015-09-12 NOTE — Discharge Instructions (Signed)
Anemia, Nonspecific Anemia is a condition in which the concentration of red blood cells or hemoglobin in the blood is below normal. Hemoglobin is a substance in red blood cells that carries oxygen to the tissues of the body. Anemia results in not enough oxygen reaching these tissues.  CAUSES  Common causes of anemia include:   Excessive bleeding. Bleeding may be internal or external. This includes excessive bleeding from periods (in women) or from the intestine.   Poor nutrition.   Chronic kidney, thyroid, and liver disease.  Bone marrow disorders that decrease red blood cell production.  Cancer and treatments for cancer.  HIV, AIDS, and their treatments.  Spleen problems that increase red blood cell destruction.  Blood disorders.  Excess destruction of red blood cells due to infection, medicines, and autoimmune disorders. SIGNS AND SYMPTOMS   Minor weakness.   Dizziness.   Headache.  Palpitations.   Shortness of breath, especially with exercise.   Paleness.  Cold sensitivity.  Indigestion.  Nausea.  Difficulty sleeping.  Difficulty concentrating. Symptoms may occur suddenly or they may develop slowly.  DIAGNOSIS  Additional blood tests are often needed. These help your health care provider determine the best treatment. Your health care provider will check your stool for blood and look for other causes of blood loss.  TREATMENT  Treatment varies depending on the cause of the anemia. Treatment can include:   Supplements of iron, vitamin B12, or folic acid.   Hormone medicines.   A blood transfusion. This may be needed if blood loss is severe.   Hospitalization. This may be needed if there is significant continual blood loss.   Dietary changes.  Spleen removal. HOME CARE INSTRUCTIONS Keep all follow-up appointments. It often takes many weeks to correct anemia, and having your health care provider check on your condition and your response to  treatment is very important. SEEK IMMEDIATE MEDICAL CARE IF:   You develop extreme weakness, shortness of breath, or chest pain.   You become dizzy or have trouble concentrating.  You develop heavy vaginal bleeding.   You develop a rash.   You have bloody or black, tarry stools.   You faint.   You vomit up blood.   You vomit repeatedly.   You have abdominal pain.  You have a fever or persistent symptoms for more than 2-3 days.   You have a fever and your symptoms suddenly get worse.   You are dehydrated.  MAKE SURE YOU:  Understand these instructions.  Will watch your condition.  Will get help right away if you are not doing well or get worse.   This information is not intended to replace advice given to you by your health care provider. Make sure you discuss any questions you have with your health care provider.   Document Released: 03/07/2004 Document Revised: 09/30/2012 Document Reviewed: 07/24/2012 Elsevier Interactive Patient Education 2016 [employer]  

## 2015-09-13 LAB — TYPE AND SCREEN
ABO/RH(D): O POS
ANTIBODY SCREEN: NEGATIVE
UNIT DIVISION: 0
UNIT DIVISION: 0

## 2015-09-19 ENCOUNTER — Encounter (HOSPITAL_COMMUNITY): Payer: Self-pay

## 2015-09-19 ENCOUNTER — Ambulatory Visit (HOSPITAL_COMMUNITY)
Admission: RE | Admit: 2015-09-19 | Discharge: 2015-09-19 | Disposition: A | Discharge status: Home / Self Care | Payer: BLUE CROSS/BLUE SHIELD | Source: Ambulatory Visit | Attending: Obstetrics and Gynecology | Admitting: Obstetrics and Gynecology

## 2015-09-19 ENCOUNTER — Other Ambulatory Visit: Payer: Self-pay | Admitting: Obstetrics and Gynecology

## 2015-09-19 DIAGNOSIS — O24012 Pre-existing diabetes mellitus, type 1, in pregnancy, second trimester: Secondary | ICD-10-CM

## 2015-09-19 DIAGNOSIS — Z3A23 23 weeks gestation of pregnancy: Secondary | ICD-10-CM | POA: Diagnosis not present

## 2015-09-19 DIAGNOSIS — O10919 Unspecified pre-existing hypertension complicating pregnancy, unspecified trimester: Secondary | ICD-10-CM

## 2015-09-19 DIAGNOSIS — O10012 Pre-existing essential hypertension complicating pregnancy, second trimester: Secondary | ICD-10-CM | POA: Diagnosis not present

## 2015-09-19 DIAGNOSIS — Z1389 Encounter for screening for other disorder: Secondary | ICD-10-CM

## 2015-09-19 DIAGNOSIS — O0992 Supervision of high risk pregnancy, unspecified, second trimester: Secondary | ICD-10-CM

## 2015-09-19 DIAGNOSIS — Z36 Encounter for antenatal screening of mother: Secondary | ICD-10-CM | POA: Diagnosis not present

## 2015-09-19 HISTORY — DX: Chronic kidney disease, unspecified: N18.9

## 2015-09-27 ENCOUNTER — Ambulatory Visit (INDEPENDENT_AMBULATORY_CARE_PROVIDER_SITE_OTHER): Payer: BLUE CROSS/BLUE SHIELD | Admitting: Obstetrics & Gynecology

## 2015-09-27 ENCOUNTER — Encounter: Payer: Self-pay | Admitting: Obstetrics & Gynecology

## 2015-09-27 VITALS — BP 128/89 | HR 87 | Wt 178.1 lb

## 2015-09-27 DIAGNOSIS — D638 Anemia in other chronic diseases classified elsewhere: Secondary | ICD-10-CM

## 2015-09-27 DIAGNOSIS — N183 Chronic kidney disease, stage 3 unspecified: Secondary | ICD-10-CM

## 2015-09-27 DIAGNOSIS — O10919 Unspecified pre-existing hypertension complicating pregnancy, unspecified trimester: Secondary | ICD-10-CM

## 2015-09-27 DIAGNOSIS — O24912 Unspecified diabetes mellitus in pregnancy, second trimester: Secondary | ICD-10-CM

## 2015-09-27 DIAGNOSIS — O10912 Unspecified pre-existing hypertension complicating pregnancy, second trimester: Secondary | ICD-10-CM

## 2015-09-27 DIAGNOSIS — O0990 Supervision of high risk pregnancy, unspecified, unspecified trimester: Secondary | ICD-10-CM

## 2015-09-27 LAB — POCT URINALYSIS DIP (DEVICE)
Bilirubin Urine: NEGATIVE
GLUCOSE, UA: 100 mg/dL — AB
Ketones, ur: NEGATIVE mg/dL
Leukocytes, UA: NEGATIVE
NITRITE: NEGATIVE
PH: 7 (ref 5.0–8.0)
Specific Gravity, Urine: 1.025 (ref 1.005–1.030)
Urobilinogen, UA: 0.2 mg/dL (ref 0.0–1.0)

## 2015-09-27 NOTE — Progress Notes (Signed)
Subjective:  Christine Reed is a 30 y.o. S AA  G1P0 (son, [name])  at [redacted]w[redacted]d being seen today for ongoing prenatal care.  She is currently monitored for the following issues for this high-risk pregnancy and has T1DM - Type F; Supervision of high risk pregnancy, antepartum; Anemia, chronic disease; Chronic hypertension during pregnancy, antepartum; Diabetic retinopathy ([place]); Diabetic nephropathy (HCC); CKD (chronic kidney disease) stage 3, GFR 30-59 ml/min; Anemia affecting pregnancy in second trimester, antepartum; and Symptomatic anemia on her problem list.  Patient reports no complaints.  Contractions: Not present.  .  Movement: Present. Denies leaking of fluid.   The following portions of the patient's history were reviewed and updated as appropriate: allergies, current medications, past family history, past medical history, past social history, past surgical history and problem list. Problem list updated.  Objective:   Vitals:   09/27/15 1245  BP: 128/89  Pulse: 87  Weight: 178 lb 1.6 oz (80.8 kg)    Fetal Status: Fetal Heart Rate (bpm): 135   Movement: Present     General:  Alert, oriented and cooperative. Patient is in no acute distress.  Skin: Skin is warm and dry. No rash noted.   Cardiovascular: Normal heart rate noted  Respiratory: Normal respiratory effort, no problems with respiration noted  Abdomen: Soft, gravid, appropriate for gestational age. Pain/Pressure: Present     Pelvic:  Cervical exam deferred        Extremities: Normal range of motion.  Edema: Trace  Mental Status: Normal mood and affect. Normal behavior. Normal judgment and thought content.   Urinalysis:      Assessment and Plan:  Pregnancy: G1P0 at [redacted]w[redacted]d  1. Supervision of high risk pregnancy, antepartum, unspecified trimester - MFM u/s 10-03-25  2. CKD (chronic kidney disease) stage 3, GFR 30-59 ml/min   3. Chronic hypertension during pregnancy, antepartum - good BPs, on baby asa daily  4.  Anemia, chronic disease - taking MVI, on epo  5. Type F DM - appt with Dr. Dwyane Dee 09-29-15 - sugars are all over the place but overall, mostly in the 100s  Needs a note to wear sneakers at work.   Preterm labor symptoms and general obstetric precautions including but not limited to vaginal bleeding, contractions, leaking of fluid and fetal movement were reviewed in detail with the patient. Please refer to After Visit Summary for other counseling recommendations.  No Follow-up on file.   Emily Filbert, MD

## 2015-09-29 ENCOUNTER — Encounter: Payer: Self-pay | Admitting: Endocrinology

## 2015-09-29 ENCOUNTER — Ambulatory Visit (INDEPENDENT_AMBULATORY_CARE_PROVIDER_SITE_OTHER): Payer: BLUE CROSS/BLUE SHIELD | Admitting: Endocrinology

## 2015-09-29 ENCOUNTER — Other Ambulatory Visit: Payer: Self-pay | Admitting: Endocrinology

## 2015-09-29 ENCOUNTER — Encounter: Payer: BLUE CROSS/BLUE SHIELD | Attending: Family Medicine | Admitting: Nutrition

## 2015-09-29 VITALS — BP 110/72 | HR 107 | Ht 65.0 in | Wt 178.0 lb

## 2015-09-29 DIAGNOSIS — D638 Anemia in other chronic diseases classified elsewhere: Secondary | ICD-10-CM | POA: Insufficient documentation

## 2015-09-29 DIAGNOSIS — O24919 Unspecified diabetes mellitus in pregnancy, unspecified trimester: Secondary | ICD-10-CM | POA: Diagnosis present

## 2015-09-29 DIAGNOSIS — E1065 Type 1 diabetes mellitus with hyperglycemia: Secondary | ICD-10-CM

## 2015-09-29 DIAGNOSIS — O468X9 Other antepartum hemorrhage, unspecified trimester: Secondary | ICD-10-CM | POA: Insufficient documentation

## 2015-09-29 DIAGNOSIS — O24912 Unspecified diabetes mellitus in pregnancy, second trimester: Secondary | ICD-10-CM

## 2015-09-29 LAB — CULTURE, OB URINE: ORGANISM ID, BACTERIA: NO GROWTH

## 2015-09-29 LAB — POCT GLYCOSYLATED HEMOGLOBIN (HGB A1C): HEMOGLOBIN A1C: 6.2

## 2015-09-29 NOTE — Progress Notes (Signed)
Patient ID: Christine Reed, female   DOB: [birth year], 28 y.o.   MRN: [id number]           Reason for Appointment : Follow-up for Type 1 Diabetes  History of Present Illness          Diagnosis: Type 1 diabetes mellitus, date of diagnosis: 2000         Previous history:   She was initially started on NPH and Regular Insulin and subsequently was on Lantus and Humalog Usually has had poor control but records are not available for the last few years from her previous endocrinologist In April her Lantus was changed Novolin N presumably because of tendency to low sugars during the night  Recent history:   INSULIN regimen is: Humalog at meals carbohydrate coverage 1:10, correction factor 1 units per 50 mg over 150 Levemir 7 units at 8 am and 11 at 9 pm  A1c is in the upper normal range at 6.2 now  Current management, blood sugar patterns and problems identified:    She was switched from NPH to Levemir insulin on her initial consultation on 08/16/15  FASTING blood sugars are still not at target even though her Levemir was increased on the last visit, she does not know how to increase this on her own  Again fasting blood sugars are somewhat variable and not clear why she has some significantly high readings  She is eating a 30 g snack in between meals as directed by her previous nurse educator and not covering these with boluses  HYPOGLYCEMIA: This has been less often with sporadic low sugars mid morning and a couple of times after lunch, lowest reading 57.She does have less hypoglycemia compared to her last visit especially after meals but still will get occasional low readings when she is not eating as much as planned for her insulin dose regularly with her morning nausea now  She is not exercising much, only once a week going for her dance  POSTPRANDIAL readings are quite variable, usually not high compared to the preprandial readings with some sporadic high readings  She thinks she can  count carbohydrates fairly well  Glucose monitoring:  is being done 3-5 times a day         Glucometer:  Contour Blood Glucose readings from meter review of download  Mean values apply above for all meters except median for One Touch  PRE-MEAL Fasting Lunch Dinner Bedtime Overall  Glucose range: 71-295   119-182  132-278    Mean/median: 132   152  185  149     Hypoglycemia:  occurs As above Factors causing hyperglycemia: Excessive insulin, sometimes increased activity Symptoms of hypoglycemia:Weakness Treatment of hypoglycemia:As above          Self-care: The diet that the patient has been following is: Carbohydrate counting Usually eating cream of wheat at breakfast, mostly salad at lunch and dinner is usually at 8 PM with fish, rice and broccoli Snacks usually are apple or crackers          Exercise:  one hour dance lessons in the evenings after supper.  Usually having.  Fruit before going for exercise and no hypoglycemia with exercise          Dietician consultation: Most recent: 5/17 in the hospital .         CDE consultation:?  Wt Readings from Last 3 Encounters:  09/29/15 178 lb (80.7 kg)  09/27/15 178 lb 1.6 oz (80.8 kg)  09/19/15  178 lb 9.6 oz (81 kg)    Diabetes labs:  Lab Results  Component Value Date   HGBA1C 6.2 09/29/2015   HGBA1C 6.2 (H) 07/17/2015   HGBA1C 12.1 02/18/2012   Lab Results  Component Value Date   LDLCALC 131 (H) 06/27/2015   CREATININE 1.60 (H) 07/12/2015   Microalbumin ratio 5011 done in 2/17  No results found for: [facility]     Medication List       Accurate as of 09/29/15  1:14 PM. Always use your most recent med list.          acetaminophen 500 MG tablet Commonly known as:  TYLENOL Take 500 mg by mouth every 6 (six) hours as needed for mild pain or headache.   aspirin 81 MG chewable tablet Chew 81 mg by mouth daily.   BAYER CONTOUR NEXT MONITOR w/Device Kit Use to check blood sugar 6 times per day.     diphenhydrAMINE 25 MG tablet Commonly known as:  BENADRYL Take 25 mg by mouth every 6 (six) hours as needed for itching or allergies.   enoxaparin 40 MG/0.4ML injection Commonly known as:  LOVENOX Inject 40 mg into the skin at bedtime.   famotidine 20 MG tablet Commonly known as:  PEPCID Take 1 tablet (20 mg total) by mouth 2 (two) times daily.   glucagon 1 MG injection Commonly known as:  GLUCAGON EMERGENCY Inject 1 mg into the vein once as needed.   glucose blood test strip Commonly known as:  BAYER CONTOUR NEXT TEST Use to check blood sugar 6 times per day.   HUMALOG KWIKPEN 100 UNIT/ML KiwkPen Generic drug:  insulin lispro Inject 2-11 Units into the skin 3 (three) times daily with meals. Pt uses per sliding scale.   Insulin Detemir 100 UNIT/ML Pen Commonly known as:  LEVEMIR Inject 8 units twice daily.   multivitamin-prenatal 27-0.8 MG Tabs tablet Take 1 tablet by mouth daily.       Allergies:  Allergies  Allergen Reactions  . Iodine Rash  . Nickel Rash    Past Medical History:  Diagnosis Date  . Chronic kidney disease   . Diabetes mellitus    diagnosed at age 21  . Hypertension     Past Surgical History:  Procedure Laterality Date  . lipo suction  2015  . TONSILLECTOMY    . TONSILLECTOMY AND ADENOIDECTOMY      Family History  Problem Relation Age of Onset  . Cancer Maternal Grandmother     colon and breast  . Diabetes Maternal Grandfather   . Diabetes Mother   . Diabetes Father   . Diabetes Paternal Grandmother   . Diabetes Paternal Grandfather     Social History:  reports that she has never smoked. She has never used smokeless tobacco. She reports that she does not drink alcohol or use drugs.      Review of Systems     LABS:  Office Visit on 09/29/2015  Component Date Value Ref Range Status  . Hemoglobin A1C 09/29/2015 6.2   Final  Routine Prenatal on 09/27/2015  Component Date Value Ref Range Status  . Organism ID, Bacteria  09/29/2015 NO GROWTH   Final   Comment: NO GROUP B STREP (S.AGALACTIAE) ISOLATED Culture based screening of vaginal/anorectal swabs at 35 to [redacted] weeks gestation is required to rule out the carriage of Group B Streptococcus.   . Glucose, UA 09/27/2015 100* NEGATIVE mg/dL Final  . Bilirubin Urine 09/27/2015 NEGATIVE  NEGATIVE Final  .  Ketones, ur 09/27/2015 NEGATIVE  NEGATIVE mg/dL Final  . Specific Gravity, Urine 09/27/2015 1.025  1.005 - 1.030 Final  . Hgb urine dipstick 09/27/2015 SMALL* NEGATIVE Final  . pH 09/27/2015 7.0  5.0 - 8.0 Final  . Protein, ur 09/27/2015 >=300* NEGATIVE mg/dL Final  . Urobilinogen, UA 09/27/2015 0.2  0.0 - 1.0 mg/dL Final  . Nitrite 09/27/2015 NEGATIVE  NEGATIVE Final  . Leukocytes, UA 09/27/2015 NEGATIVE  NEGATIVE Final    Physical Examination:  BP 110/72   Pulse (!) 107   Ht _0  (1.651 m)   Wt 178 lb (80.7 kg)   LMP 04/03/2015   SpO2 98%   BMI 29.62 kg/m   ASSESSMENT:  Diabetes type 1,Previously uncontrolled See history of present illness for detailed discussion of current diabetes management, blood sugar patterns and problems identified Although her A1c is 6.2 her blood sugars are are not at target for her pregnancy She has variable readings at all times Does have mildly increased fasting readings overall and sporadic high post pen or readings Also tends to be having higher readings before supper and this may be related to an afternoon snack not covered by insulin She does have less hypoglycemia compared to her last visit especially after meals but still will get occasional low readings when she is not eating as much as planned for her insulin dose regularly with her morning nausea now   PLAN:    Switch doses of Levemir to 8 in the morning and 12 in the evening  May need to adjust evening Levemir dose further based on morning reading patterns, discussed target of under 90  She will be a good candidate for insulin pump especially since  she has had poor control prepregnancy also and will be interested in doing this long-term  No change in current mealtime coverage but she will need to take extra coverage for snacks if she has them during the day  Check blood sugars more often, currently not doing adequate monitoring and not consistently  She will have consultation with nurse educator today  Patient Instructions  Increase Levemir to 8 units in the morning and 12 in the evening  Check more sugars before and after evening meal  Need to keep blood sugars under 140 after meals and under 90 before breakfast   Counseling time on subjects discussed above is over 50% of today's 25 minute visit   Delyle Weider 09/29/2015, 1:14 PM   Note: This note was prepared with Dragon voice recognition system technology. Any transcriptional errors that result from this process are unintentional.

## 2015-09-29 NOTE — Patient Instructions (Signed)
Increase Levemir to 8 units in the morning and 12 in the evening  Check more sugars before and after evening meal  Need to keep blood sugars under 140 after meals and under 90 before breakfast

## 2015-10-04 ENCOUNTER — Encounter (HOSPITAL_COMMUNITY)
Admission: RE | Admit: 2015-10-04 | Discharge: 2015-10-04 | Disposition: A | Discharge status: Home / Self Care | Payer: BLUE CROSS/BLUE SHIELD | Source: Ambulatory Visit | Attending: Nephrology | Admitting: Nephrology

## 2015-10-04 DIAGNOSIS — N183 Chronic kidney disease, stage 3 unspecified: Secondary | ICD-10-CM

## 2015-10-04 DIAGNOSIS — D638 Anemia in other chronic diseases classified elsewhere: Secondary | ICD-10-CM | POA: Diagnosis not present

## 2015-10-04 LAB — CBC
HCT: 26.5 % — ABNORMAL LOW (ref 36.0–46.0)
Hemoglobin: 9.2 g/dL — ABNORMAL LOW (ref 12.0–15.0)
MCH: 30.1 pg (ref 26.0–34.0)
MCHC: 34.7 g/dL (ref 30.0–36.0)
MCV: 86.6 fL (ref 78.0–100.0)
PLATELETS: 244 10*3/uL (ref 150–400)
RBC: 3.06 MIL/uL — ABNORMAL LOW (ref 3.87–5.11)
RDW: 13.1 % (ref 11.5–15.5)
WBC: 13.2 10*3/uL — AB (ref 4.0–10.5)

## 2015-10-04 MED ORDER — EPOETIN ALFA 20000 UNIT/ML IJ SOLN
INTRAMUSCULAR | Status: AC
  Administered 2015-10-04: 20000 [IU]
  Filled 2015-10-04: qty 1

## 2015-10-04 MED ORDER — EPOETIN ALFA 20000 UNIT/ML IJ SOLN: 20000.0000 [IU] | INTRAMUSCULAR | Status: DC

## 2015-10-04 MED FILL — FAMOTIDINE 20 MG TABLET: 20 | 30 days supply | Qty: 60 | Fill #3

## 2015-10-06 ENCOUNTER — Encounter: Payer: Self-pay | Admitting: *Deleted

## 2015-10-06 ENCOUNTER — Telehealth: Payer: Self-pay | Admitting: Nutrition

## 2015-10-06 NOTE — Patient Instructions (Signed)
Take insulin for bedtime snacks Look over information on all of the pumps and call me if you have questions.

## 2015-10-06 NOTE — Progress Notes (Signed)
Opened in error

## 2015-10-06 NOTE — Assessment & Plan Note (Signed)
This patient is [redacted] weeks pregnant.  We reviewed her new insulin dose, and she re verbalized that she will take 8u of Levemir in the AM and 12u at HS. We discussed insulin pump therapy--the advantages and disadvantages of this.  She was shown the different models and we discussed the advantages of each model  She was given brochures on each model to review and she will let me know what model she chooses. We disussed how pump therapy works and the need to count carbohydrates.  She says she knows how  To do this, and that she takes 1u for every 10 grams.  She also does a correction and says adds 1 unit for every 50 points she is above 150.   She was congratulated on her hard work for doing this, although looking at her readings, I am not sure she is doing this.  I asked her if she gives insulin for her bedtime snack of 30 carbs and she does not.  She was told that she needs to do this with all food eaten, unless she is treating a low blood sugar.  She reported a good understanding of this and had no final questions.

## 2015-10-17 ENCOUNTER — Ambulatory Visit (INDEPENDENT_AMBULATORY_CARE_PROVIDER_SITE_OTHER): Payer: BLUE CROSS/BLUE SHIELD | Admitting: Certified Nurse Midwife

## 2015-10-17 ENCOUNTER — Inpatient Hospital Stay (HOSPITAL_COMMUNITY): Payer: BLUE CROSS/BLUE SHIELD

## 2015-10-17 ENCOUNTER — Encounter (HOSPITAL_COMMUNITY): Payer: Self-pay | Admitting: *Deleted

## 2015-10-17 ENCOUNTER — Inpatient Hospital Stay (HOSPITAL_COMMUNITY)
Admission: AD | Admit: 2015-10-17 | Discharge: 2015-10-22 | DRG: 765 | Disposition: A | Discharge status: Home / Self Care | Payer: BLUE CROSS/BLUE SHIELD | Source: Ambulatory Visit | Attending: Family Medicine | Admitting: Family Medicine

## 2015-10-17 VITALS — BP 148/95 | HR 88 | Wt 182.0 lb

## 2015-10-17 DIAGNOSIS — O24912 Unspecified diabetes mellitus in pregnancy, second trimester: Secondary | ICD-10-CM

## 2015-10-17 DIAGNOSIS — E669 Obesity, unspecified: Secondary | ICD-10-CM | POA: Diagnosis present

## 2015-10-17 DIAGNOSIS — I10 Essential (primary) hypertension: Secondary | ICD-10-CM

## 2015-10-17 DIAGNOSIS — Z23 Encounter for immunization: Secondary | ICD-10-CM

## 2015-10-17 DIAGNOSIS — O10912 Unspecified pre-existing hypertension complicating pregnancy, second trimester: Secondary | ICD-10-CM

## 2015-10-17 DIAGNOSIS — Z3A27 27 weeks gestation of pregnancy: Secondary | ICD-10-CM | POA: Diagnosis not present

## 2015-10-17 DIAGNOSIS — N183 Chronic kidney disease, stage 3 unspecified: Secondary | ICD-10-CM | POA: Diagnosis present

## 2015-10-17 DIAGNOSIS — D638 Anemia in other chronic diseases classified elsewhere: Secondary | ICD-10-CM | POA: Diagnosis present

## 2015-10-17 DIAGNOSIS — O24312 Unspecified pre-existing diabetes mellitus in pregnancy, second trimester: Secondary | ICD-10-CM | POA: Diagnosis not present

## 2015-10-17 DIAGNOSIS — E10319 Type 1 diabetes mellitus with unspecified diabetic retinopathy without macular edema: Secondary | ICD-10-CM | POA: Diagnosis present

## 2015-10-17 DIAGNOSIS — O114 Pre-existing hypertension with pre-eclampsia, complicating childbirth: Secondary | ICD-10-CM | POA: Diagnosis not present

## 2015-10-17 DIAGNOSIS — O1022 Pre-existing hypertensive chronic kidney disease complicating childbirth: Secondary | ICD-10-CM | POA: Diagnosis not present

## 2015-10-17 DIAGNOSIS — E1021 Type 1 diabetes mellitus with diabetic nephropathy: Secondary | ICD-10-CM | POA: Diagnosis present

## 2015-10-17 DIAGNOSIS — E119 Type 2 diabetes mellitus without complications: Secondary | ICD-10-CM | POA: Diagnosis not present

## 2015-10-17 DIAGNOSIS — O9902 Anemia complicating childbirth: Secondary | ICD-10-CM | POA: Diagnosis present

## 2015-10-17 DIAGNOSIS — I129 Hypertensive chronic kidney disease with stage 1 through stage 4 chronic kidney disease, or unspecified chronic kidney disease: Secondary | ICD-10-CM | POA: Diagnosis present

## 2015-10-17 DIAGNOSIS — Z7982 Long term (current) use of aspirin: Secondary | ICD-10-CM | POA: Diagnosis not present

## 2015-10-17 DIAGNOSIS — O10212 Pre-existing hypertensive chronic kidney disease complicating pregnancy, second trimester: Secondary | ICD-10-CM | POA: Diagnosis present

## 2015-10-17 DIAGNOSIS — O2402 Pre-existing diabetes mellitus, type 1, in childbirth: Secondary | ICD-10-CM | POA: Diagnosis present

## 2015-10-17 DIAGNOSIS — O24919 Unspecified diabetes mellitus in pregnancy, unspecified trimester: Secondary | ICD-10-CM

## 2015-10-17 DIAGNOSIS — IMO0002 Reserved for concepts with insufficient information to code with codable children: Secondary | ICD-10-CM

## 2015-10-17 DIAGNOSIS — O10919 Unspecified pre-existing hypertension complicating pregnancy, unspecified trimester: Secondary | ICD-10-CM

## 2015-10-17 DIAGNOSIS — Z6830 Body mass index (BMI) 30.0-30.9, adult: Secondary | ICD-10-CM | POA: Diagnosis not present

## 2015-10-17 DIAGNOSIS — Z833 Family history of diabetes mellitus: Secondary | ICD-10-CM | POA: Diagnosis not present

## 2015-10-17 DIAGNOSIS — Z794 Long term (current) use of insulin: Secondary | ICD-10-CM

## 2015-10-17 DIAGNOSIS — R188 Other ascites: Secondary | ICD-10-CM | POA: Diagnosis present

## 2015-10-17 DIAGNOSIS — E1121 Type 2 diabetes mellitus with diabetic nephropathy: Secondary | ICD-10-CM | POA: Diagnosis present

## 2015-10-17 DIAGNOSIS — R030 Elevated blood-pressure reading, without diagnosis of hypertension: Secondary | ICD-10-CM | POA: Diagnosis present

## 2015-10-17 DIAGNOSIS — N049 Nephrotic syndrome with unspecified morphologic changes: Secondary | ICD-10-CM

## 2015-10-17 DIAGNOSIS — O0992 Supervision of high risk pregnancy, unspecified, second trimester: Secondary | ICD-10-CM

## 2015-10-17 HISTORY — DX: Anemia, unspecified: D64.9

## 2015-10-17 HISTORY — DX: Type 2 diabetes mellitus with unspecified diabetic retinopathy without macular edema: E11.319

## 2015-10-17 LAB — COMPREHENSIVE METABOLIC PANEL
ALBUMIN: 1.4 g/dL — AB (ref 3.5–5.0)
ALT: 28 U/L (ref 14–54)
ANION GAP: 4 — AB (ref 5–15)
AST: 33 U/L (ref 15–41)
Alkaline Phosphatase: 57 U/L (ref 38–126)
BUN: 28 mg/dL — AB (ref 6–20)
CHLORIDE: 112 mmol/L — AB (ref 101–111)
CO2: 19 mmol/L — AB (ref 22–32)
Calcium: 8.2 mg/dL — ABNORMAL LOW (ref 8.9–10.3)
Creatinine, Ser: 1.9 mg/dL — ABNORMAL HIGH (ref 0.44–1.00)
GFR calc Af Amer: 40 mL/min — ABNORMAL LOW (ref >60)
GFR calc non Af Amer: 35 mL/min — ABNORMAL LOW (ref >60)
GLUCOSE: 180 mg/dL — AB (ref 65–99)
POTASSIUM: 4.9 mmol/L (ref 3.5–5.1)
SODIUM: 135 mmol/L (ref 135–145)
Total Bilirubin: 0.6 mg/dL (ref 0.3–1.2)
Total Protein: 4.6 g/dL — ABNORMAL LOW (ref 6.5–8.1)

## 2015-10-17 LAB — URINALYSIS, ROUTINE W REFLEX MICROSCOPIC
Bilirubin Urine: NEGATIVE
Glucose, UA: 500 mg/dL — AB
KETONES UR: NEGATIVE mg/dL
LEUKOCYTES UA: NEGATIVE
NITRITE: NEGATIVE
Specific Gravity, Urine: 1.02 (ref 1.005–1.030)
pH: 6 (ref 5.0–8.0)

## 2015-10-17 LAB — CBC
HEMATOCRIT: 23.3 % — AB (ref 36.0–46.0)
Hemoglobin: 8.3 g/dL — ABNORMAL LOW (ref 12.0–15.0)
MCH: 30.2 pg (ref 26.0–34.0)
MCHC: 35.6 g/dL (ref 30.0–36.0)
MCV: 84.7 fL (ref 78.0–100.0)
PLATELETS: 196 10*3/uL (ref 150–400)
RBC: 2.75 MIL/uL — ABNORMAL LOW (ref 3.87–5.11)
RDW: 14.9 % (ref 11.5–15.5)
WBC: 12.8 10*3/uL — AB (ref 4.0–10.5)

## 2015-10-17 LAB — POCT URINALYSIS DIP (DEVICE)
Bilirubin Urine: NEGATIVE
GLUCOSE, UA: 500 mg/dL — AB
KETONES UR: NEGATIVE mg/dL
Leukocytes, UA: NEGATIVE
Nitrite: NEGATIVE
Protein, ur: >=300 mg/dL — AB
SPECIFIC GRAVITY, URINE: 1.02 (ref 1.005–1.030)
Urobilinogen, UA: 0.2 mg/dL (ref 0.0–1.0)
pH: 6 (ref 5.0–8.0)

## 2015-10-17 LAB — GLUCOSE, CAPILLARY
GLUCOSE-CAPILLARY: 210 mg/dL — AB (ref 65–99)
GLUCOSE-CAPILLARY: 222 mg/dL — AB (ref 65–99)
Glucose-Capillary: 189 mg/dL — ABNORMAL HIGH (ref 65–99)
Glucose-Capillary: 207 mg/dL — ABNORMAL HIGH (ref 65–99)

## 2015-10-17 LAB — URINE MICROSCOPIC-ADD ON: RBC / HPF: NONE SEEN RBC/hpf (ref 0–5)

## 2015-10-17 LAB — PROTEIN / CREATININE RATIO, URINE
CREATININE, URINE: 87 mg/dL
PROTEIN CREATININE RATIO: 9.09 mg/mg{creat} — AB (ref 0.00–0.15)
TOTAL PROTEIN, URINE: 791 mg/dL

## 2015-10-17 MED ORDER — LABETALOL HCL 5 MG/ML IV SOLN: 20.0000 mg | INTRAVENOUS | Status: DC | PRN

## 2015-10-17 MED ORDER — NIFEDIPINE ER OSMOTIC RELEASE 30 MG PO TB24
30.0000 mg | ORAL_TABLET | Freq: Two times a day (BID) | ORAL | Status: DC
  Administered 2015-10-17 – 2015-10-19 (×5): 30 mg via ORAL
  Filled 2015-10-17 (×5): qty 1

## 2015-10-17 MED ORDER — FAMOTIDINE 20 MG PO TABS
20.0000 mg | ORAL_TABLET | Freq: Two times a day (BID) | ORAL | Status: DC
  Administered 2015-10-17 – 2015-10-19 (×4): 20 mg via ORAL
  Filled 2015-10-17 (×4): qty 1

## 2015-10-17 MED ORDER — ZOLPIDEM TARTRATE 5 MG PO TABS: 5.0000 mg | ORAL_TABLET | Freq: Every evening | ORAL | Status: DC | PRN

## 2015-10-17 MED ORDER — INSULIN DETEMIR 100 UNIT/ML ~~LOC~~ SOLN
18.0000 [IU] | Freq: Two times a day (BID) | SUBCUTANEOUS | Status: DC
  Administered 2015-10-17: 18 [IU] via SUBCUTANEOUS
  Filled 2015-10-17 (×2): qty 0.18

## 2015-10-17 MED ORDER — BETAMETHASONE SOD PHOS & ACET 6 (3-3) MG/ML IJ SUSP
12.0000 mg | INTRAMUSCULAR | Status: DC
  Administered 2015-10-17: 12 mg via INTRAMUSCULAR
  Filled 2015-10-17: qty 2

## 2015-10-17 MED ORDER — ACETAMINOPHEN 500 MG PO TABS: 500.0000 mg | ORAL_TABLET | Freq: Four times a day (QID) | ORAL | Status: DC | PRN

## 2015-10-17 MED ORDER — ASPIRIN 81 MG PO CHEW
81.0000 mg | CHEWABLE_TABLET | Freq: Every day | ORAL | Status: DC
  Administered 2015-10-18: 81 mg via ORAL
  Filled 2015-10-17: qty 1

## 2015-10-17 MED ORDER — LACTATED RINGERS IV SOLN
INTRAVENOUS | Status: DC
  Administered 2015-10-17: 15:00:00 via INTRAVENOUS

## 2015-10-17 MED ORDER — LABETALOL HCL 100 MG PO TABS
200.0000 mg | ORAL_TABLET | Freq: Once | ORAL | Status: AC
  Administered 2015-10-17: 200 mg via ORAL
  Filled 2015-10-17: qty 2

## 2015-10-17 MED ORDER — CALCIUM CARBONATE ANTACID 500 MG PO CHEW: 2.0000 | CHEWABLE_TABLET | ORAL | Status: DC | PRN

## 2015-10-17 MED ORDER — DOCUSATE SODIUM 100 MG PO CAPS
100.0000 mg | ORAL_CAPSULE | Freq: Every day | ORAL | Status: DC
Start: 2015-10-18 — End: 2015-10-18
  Administered 2015-10-18: 100 mg via ORAL
  Filled 2015-10-17: qty 1

## 2015-10-17 MED ORDER — HYDRALAZINE HCL 20 MG/ML IJ SOLN: 5.0000 mg | INTRAMUSCULAR | Status: DC | PRN

## 2015-10-17 MED ORDER — ACETAMINOPHEN 325 MG PO TABS
650.0000 mg | ORAL_TABLET | ORAL | Status: DC | PRN
  Administered 2015-10-18 (×2): 650 mg via ORAL
  Filled 2015-10-17 (×2): qty 2

## 2015-10-17 MED ORDER — TETANUS-DIPHTH-ACELL PERTUSSIS 5-2.5-18.5 LF-MCG/0.5 IM SUSP
0.5000 mL | Freq: Once | INTRAMUSCULAR | Status: AC
  Administered 2015-10-17: 0.5 mL via INTRAMUSCULAR

## 2015-10-17 MED ORDER — ENOXAPARIN SODIUM 40 MG/0.4ML ~~LOC~~ SOLN
40.0000 mg | Freq: Every day | SUBCUTANEOUS | Status: DC
  Administered 2015-10-17: 40 mg via SUBCUTANEOUS
  Filled 2015-10-17 (×2): qty 0.4

## 2015-10-17 MED ORDER — PRENATAL MULTIVITAMIN CH: 1.0000 | ORAL_TABLET | Freq: Every day | ORAL | Status: DC

## 2015-10-17 MED ORDER — INSULIN ASPART 100 UNIT/ML ~~LOC~~ SOLN
0.0000 [IU] | Freq: Three times a day (TID) | SUBCUTANEOUS | Status: DC
  Administered 2015-10-17 – 2015-10-18 (×2): 7 [IU] via SUBCUTANEOUS

## 2015-10-17 NOTE — MAU Note (Signed)
Sent from Clinic with elevated BP's, hyperglycemia. C/O "stomach ache."

## 2015-10-17 NOTE — Progress Notes (Signed)
Patient reports usual headaches but denies blurry vision or dizziness; states she overall does not feel well today

## 2015-10-17 NOTE — Addendum Note (Signed)
Addended by: Phillip Heal, DEMETRICE A on: 10/17/2015 02:57 PM   Modules accepted: Orders

## 2015-10-17 NOTE — Progress Notes (Signed)
Subjective:  Christine Reed is a 30 y.o. G1P0 at [redacted]w[redacted]d being seen today for ongoing prenatal care.  She is currently monitored for the following issues for this high-risk pregnancy and has T1DM - Type F; Supervision of high risk pregnancy, antepartum; Anemia, chronic disease; Chronic hypertension during pregnancy, antepartum; Diabetic retinopathy ([place]); Diabetic nephropathy (HCC); CKD (chronic kidney disease) stage 3, GFR 30-59 ml/min; Anemia affecting pregnancy in second trimester, antepartum; and Symptomatic anemia on her problem list.  Patient reports diarrhea and vomiting since last night, tolerating po liquids this am. Increased LE edema over the last week. FBS this am 340. States "I just don't feel well"..  Contractions: Not present. Vag. Bleeding: None.  Movement: Present. Denies leaking of fluid. She denies headache and epigastric pain. Blood glucose values: 228 after dinner, 94 at hs (last night) The following portions of the patient's history were reviewed and updated as appropriate: allergies, current medications, past family history, past medical history, past social history, past surgical history and problem list. Problem list updated.  Objective:   Vitals:   10/17/15 1005 10/17/15 1011  BP: (!) 156/91 (!) 148/95  Pulse: 88   Weight: 182 lb (82.6 kg)   Repeat BP: 145/95  Fetal Status: Fetal Heart Rate (bpm): 154   Movement: Present     General:  Alert, oriented and cooperative. Patient is in no acute distress.  Skin: Skin is warm and dry. No rash noted.   Cardiovascular: Normal heart rate noted  Respiratory: Normal respiratory effort, no problems with respiration noted  Abdomen: Soft, gravid, appropriate for gestational age. Pain/Pressure: Absent     Pelvic: Vag. Bleeding: None     Cervical exam deferred        Extremities: Normal range of motion.  Edema: Moderate pitting, indentation subsides rapidly  Mental Status: Normal mood and affect. Normal behavior. Normal judgment and  thought content.  3+ pitting edema of BLE, 1+ pitting edema of lower abdomen Urinalysis: Urine Protein: 4+ Urine Glucose: 3+  Assessment and Plan:  Pregnancy: G1P0 at [redacted]w[redacted]d  1. Supervision of high risk pregnancy in second trimester - RPR - HIV antibody (with reflex) - Tdap (BOOSTRIX) injection 0.5 mL; Inject 0.5 mLs into the muscle once. - Flu Vaccine QUAD 36+ mos IM (Fluarix, Quad PF)  2. Supervision of high risk pregnancy, antepartum, second trimester - growth [place] tomorrow  3. Diabetes mellitus complicating pregnancy, second trimester ([place]) - poor control vs DKA  4. Chronic hypertension during pregnancy, antepartum - worsening chtn vs Pre-e  Preterm labor symptoms and general obstetric precautions including but not limited to vaginal bleeding, contractions, leaking of fluid and fetal movement were reviewed in detail with the patient. Please refer to After Visit Summary for other counseling recommendations.   To MAU now for evaluation Follow Up in 1 week in [facility] [place]

## 2015-10-17 NOTE — Progress Notes (Signed)
Subjective Christine Reed is a 30 yo G1P0 at 63w3dGA who was sent to the MAU from clinic with elevated blood pressures, hyperglycemia and stomach ache.  Patient reports diarrhea and vomiting since last night, tolerating po liquids this am. She has increased LE edema over the last week. FBS this am 340.  POCT at this visit is 189.  She took her Levemir and Humalog this am.  Blood glucose values: 228 after dinner, 94 at hs (last night)  She denies contractions, vaginal bleeding, vaginal discharge and reports fetal movement.  FHR monitoring currently reassuring.  She is being monitored for the following issues during this high-risk pregnancy: T1DM - Type F diagnosed in 2000; Supervision of high risk pregnancy, antepartum; Anemia, chronic disease; Chronic hypertension during pregnancy, antepartum; Diabetic retinopathy ([place]; Diabetic nephropathy (HCC); CKD (chronic kidney disease) stage 3, GFR 30-59 ml/min; Anemia affecting pregnancy in second trimester, antepartum; and Symptomatic anemia on her problem list.  She is being followed by an endocrinologist and nephrologist for her diabetes and chronic kidney disease.  OB History    Gravida Para Term Preterm AB Living   1             SAB TAB Ectopic Multiple Live Births                 Past Medical History:  Diagnosis Date  . Anemia   . Chronic kidney disease   . Diabetes mellitus    diagnosed at age 30 . Diabetic retinopathy ([place]   . Hypertension    No current facility-administered medications on file prior to encounter.    Current Outpatient Prescriptions on File Prior to Encounter  Medication Sig Dispense Refill  . acetaminophen (TYLENOL) 500 MG tablet Take 500 mg by mouth every 6 (six) hours as needed for mild pain or headache.    .Marland Kitchenaspirin 81 MG chewable tablet Chew 81 mg by mouth daily.    . diphenhydrAMINE (BENADRYL) 25 MG tablet Take 25 mg by mouth every 6 (six) hours as needed for itching or allergies.     .Marland Kitchenenoxaparin (LOVENOX) 40  MG/0.4ML injection Inject 40 mg into the skin at bedtime.    . famotidine (PEPCID) 20 MG tablet Take 1 tablet (20 mg total) by mouth 2 (two) times daily. 60 tablet 11  . Insulin Detemir (LEVEMIR) 100 UNIT/ML Pen Inject 8 units twice daily. (Patient taking differently: Inject 8-12 Units into the skin 2 (two) times daily. Inject 8 units in the morning and 12 units at night) 15 mL 4  . insulin lispro (HUMALOG KWIKPEN) 100 UNIT/ML KiwkPen Inject 2-11 Units into the skin 3 (three) times daily with meals. Pt uses per sliding scale.    . Prenatal Vit-Fe Fumarate-FA (MULTIVITAMIN-PRENATAL) 27-0.8 MG TABS tablet Take 1 tablet by mouth daily.     . Blood Glucose Monitoring Suppl (BAYER CONTOUR NEXT MONITOR) w/Device KIT Use to check blood sugar 6 times per day. 1 kit 1  . glucagon (GLUCAGON EMERGENCY) 1 MG injection Inject 1 mg into the vein once as needed. 1 each 12  . glucose blood (BAYER CONTOUR NEXT TEST) test strip Use to check blood sugar 6 times per day. 200 each 2   Review of Systems  Constitutional: Negative for chills and fever.  HENT: Negative for tinnitus.   Eyes: Negative for blurred vision and double vision.  Respiratory: Negative for cough, hemoptysis and shortness of breath.   Cardiovascular: Positive for leg swelling. Negative for chest pain and  palpitations.  Gastrointestinal: Positive for diarrhea, nausea and vomiting. Negative for blood in stool.  Genitourinary: Negative for dysuria.  Skin: Negative for rash.  Neurological: Negative for headaches.   Objective Vitals:   10/17/15 1229 10/17/15 1238  BP: (!) 164/107 (!) 172/102  Pulse: 88 79  Resp:    Temp:     Results for orders placed or performed during the hospital encounter of 10/17/15 (from the past 24 hour(s))  Urinalysis, Routine w reflex microscopic (not at [facility])     Status: Abnormal   Collection Time: 10/17/15 11:02 AM  Result Value Ref Range   Color, Urine YELLOW YELLOW   APPearance CLEAR CLEAR   Specific Gravity,  Urine 1.020 1.005 - 1.030   pH 6.0 5.0 - 8.0   Glucose, UA 500 (A) NEGATIVE mg/dL   Hgb urine dipstick LARGE (A) NEGATIVE   Bilirubin Urine NEGATIVE NEGATIVE   Ketones, ur NEGATIVE NEGATIVE mg/dL   Protein, ur >300 (A) NEGATIVE mg/dL   Nitrite NEGATIVE NEGATIVE   Leukocytes, UA NEGATIVE NEGATIVE  Protein / creatinine ratio, urine     Status: None (Preliminary result)   Collection Time: 10/17/15 11:02 AM  Result Value Ref Range   Creatinine, Urine 87.00 mg/dL   Total Protein, Urine PENDING mg/dL   Protein Creatinine Ratio PENDING 0.00 - 0.15 mg/mg[Cre]  Urine microscopic-add on     Status: Abnormal   Collection Time: 10/17/15 11:02 AM  Result Value Ref Range   Squamous Epithelial / LPF 0-5 (A) NONE SEEN   WBC, UA 6-30 0 - 5 WBC/hpf   RBC / HPF NONE SEEN 0 - 5 RBC/hpf   Bacteria, UA RARE (A) NONE SEEN  Glucose, capillary     Status: Abnormal   Collection Time: 10/17/15 11:25 AM  Result Value Ref Range   Glucose-Capillary 189 (H) 65 - 99 mg/dL  CBC     Status: Abnormal   Collection Time: 10/17/15 11:58 AM  Result Value Ref Range   WBC 12.8 (H) 4.0 - 10.5 K/uL   RBC 2.75 (L) 3.87 - 5.11 MIL/uL   Hemoglobin 8.3 (L) 12.0 - 15.0 g/dL   HCT 23.3 (L) 36.0 - 46.0 %   MCV 84.7 78.0 - 100.0 fL   MCH 30.2 26.0 - 34.0 pg   MCHC 35.6 30.0 - 36.0 g/dL   RDW 14.9 11.5 - 15.5 %   Platelets 196 150 - 400 K/uL   The pt has a documented prenatal history of proteinuria.   Physical Exam  Constitutional: She is oriented to person, place, and time. She appears well-developed and well-nourished. No distress.  HENT:  Head: Normocephalic.  Eyes: EOM are normal.  Cardiovascular: Normal rate and regular rhythm.   Pulmonary/Chest: Effort normal and breath sounds normal. No respiratory distress. She has no wheezes.  Abdominal: Soft. Bowel sounds are normal. There is no guarding.  Abdomen gravid.    Musculoskeletal: Normal range of motion. She exhibits edema.  Neurological: She is alert and  oriented to person, place, and time.  Skin: Skin is warm and dry. No erythema.  Psychiatric: She has a normal mood and affect. Her behavior is normal.   Assessment and Plan 1. Chronic hypertension during pregnancy     Labetolol 24m PO TID  2. Supervision of high risk pregnancy, antepartum, second trimester     [place] BPP of fetus     Continue fetal monitoring  3. Diabetes mellitus complicating pregnancy, second trimester      Continue insulin regimen

## 2015-10-17 NOTE — H&P (Signed)
ANTEPARTUM ADMISSION HISTORY AND PHYSICAL NOTE   History of Present Illness: Christine Reed is a 30 y.o. G1P0 at 63w3dadmitted for severe range BP not controlled with oral agents and uncontrolled diabetes class F. Patient reports the fetal movement as active. Patient reports uterine contraction  activity as none. Patient reports  vaginal bleeding as none. Patient describes fluid per vagina as None.  Subjective Christine Reed is a 30yo G1P0 at 262w3dA who was sent to the MAU from clinic with elevated blood pressures, hyperglycemia and stomach ache.  Patient reports diarrhea and vomiting since last night, tolerating po liquids this am. She has increased LE edema over the last week. FBS this am 340.  POCT at this visit is 189.  She took her Levemir and Humalog this am.  Blood glucose values: 228 after dinner, 94 at hs (last night)  She is being followed by an endocrinologist and nephrologist for her diabetes and chronic kidney disease.  She has baseline labs with a baseline Cr of 1.4 and a Pr/Cr ratio of approximately 6.   She denies intractable headache, SOB, RUQ pain. She does endorse severe swelling.   She is on lovenox due to her protenuria.  Patient Active Problem List   Diagnosis Date Noted  . Chronic hypertension 10/17/2015  . Symptomatic anemia 09/11/2015  . Anemia affecting pregnancy in second trimester, antepartum 08/12/2015  . Diabetic nephropathy ([place]06/26/2017  . CKD (chronic kidney disease) stage 3, GFR 30-59 ml/min 08/07/2015  . Diabetic retinopathy ([place]06/01/2016  . Chronic hypertension during pregnancy, antepartum 07/22/2015  . Anemia, chronic disease 07/17/2015  . T1DM - Type F 06/16/2015  . Supervision of high risk pregnancy, antepartum 06/16/2015    Past Medical History:  Diagnosis Date  . Anemia   . Chronic kidney disease   . Diabetes mellitus    diagnosed at age 30. Diabetic retinopathy ([place]CP[place]  . Hypertension     Past Surgical History:   Procedure Laterality Date  . lipo suction  2015  . TONSILLECTOMY    . TONSILLECTOMY AND ADENOIDECTOMY      OB History  Gravida Para Term Preterm AB Living  1            SAB TAB Ectopic Multiple Live Births               # Outcome Date GA Lbr Len/2nd Weight Sex Delivery Anes PTL Lv  1 Current               Social History   Social History  . Marital status: Single    Spouse name: N/A  . Number of children: N/A  . Years of education: N/A   Social History Main Topics  . Smoking status: Never Smoker  . Smokeless tobacco: Never Used  . Alcohol use No  . Drug use: No  . Sexual activity: Not Currently   Other Topics Concern  . None   Social History Narrative  . None    Family History  Problem Relation Age of Onset  . Cancer Maternal Grandmother     colon and breast  . Diabetes Maternal Grandfather   . Diabetes Mother   . Diabetes Father   . Diabetes Paternal Grandmother   . Diabetes Paternal Grandfather     Allergies  Allergen Reactions  . Iodine Rash  . Nickel Rash    Prescriptions Prior to Admission  Medication Sig Dispense Refill Last Dose  . acetaminophen (TYLENOL) 500  MG tablet Take 500 mg by mouth every 6 (six) hours as needed for mild pain or headache.   Past Month at Unknown time  . aspirin 81 MG chewable tablet Chew 81 mg by mouth daily.   10/17/2015 at Unknown time  . diphenhydrAMINE (BENADRYL) 25 MG tablet Take 25 mg by mouth every 6 (six) hours as needed for itching or allergies.    Past Month at Unknown time  . enoxaparin (LOVENOX) 40 MG/0.4ML injection Inject 40 mg into the skin at bedtime.   10/16/2015 at Unknown time  . famotidine (PEPCID) 20 MG tablet Take 1 tablet (20 mg total) by mouth 2 (two) times daily. 60 tablet 11 10/17/2015 at Unknown time  . Insulin Detemir (LEVEMIR) 100 UNIT/ML Pen Inject 8 units twice daily. (Patient taking differently: Inject 8-12 Units into the skin 2 (two) times daily. Inject 8 units in the morning and 12 units at  night) 15 mL 4 10/17/2015 at Unknown time  . insulin lispro (HUMALOG KWIKPEN) 100 UNIT/ML KiwkPen Inject 2-11 Units into the skin 3 (three) times daily with meals. Pt uses per sliding scale.   10/17/2015 at Unknown time  . Prenatal Vit-Fe Fumarate-FA (MULTIVITAMIN-PRENATAL) 27-0.8 MG TABS tablet Take 1 tablet by mouth daily.    10/16/2015 at Unknown time  . [DISCONTINUED] Blood Glucose Monitoring Suppl (BAYER CONTOUR NEXT MONITOR) w/Device KIT Use to check blood sugar 6 times per day. 1 kit 1 Taking  . [DISCONTINUED] glucagon (GLUCAGON EMERGENCY) 1 MG injection Inject 1 mg into the vein once as needed. 1 each 12 Taking  . [DISCONTINUED] glucose blood (BAYER CONTOUR NEXT TEST) test strip Use to check blood sugar 6 times per day. 200 each 2 Taking   Review of Systems  Constitutional: Negative for chills and fever.  HENT: Negative for congestion.   Eyes: Negative for blurred vision and double vision.  Respiratory: Negative for cough and hemoptysis.   Cardiovascular: Negative for chest pain and palpitations.  Gastrointestinal: Negative for abdominal pain, heartburn, nausea and vomiting.  Genitourinary: Negative for dysuria.  Skin: Negative for itching and rash.  Neurological: Negative for headaches.  Endo/Heme/Allergies: Negative for environmental allergies. Does not bruise/bleed easily.      Vitals:  BP 146/92   Pulse 90   Temp 98.3 F (36.8 C) (Oral)   Resp 20   LMP 04/03/2015  Physical Examination: CONSTITUTIONAL: Well-developed, well-nourished female in no acute distress.  HENT:  Normocephalic, atraumatic, External right and left ear normal. Oropharynx is clear and moist EYES: Conjunctivae and EOM are normal. Pupils are equal, round, and reactive to light. No scleral icterus.  NECK: Normal range of motion, supple, no masses SKIN: Skin is warm and dry. No rash noted. Not diaphoretic. No erythema. No pallor. [place]: Alert and oriented to person, place, and time. Normal reflexes, muscle  tone coordination. No cranial nerve deficit noted. PSYCHIATRIC: Normal mood and affect. Normal behavior. Normal judgment and thought content. CARDIOVASCULAR: Normal heart rate noted, regular rhythm RESPIRATORY: Effort and breath sounds normal, no problems with respiration noted ABDOMEN: Soft, nontender, nondistended, gravid. MUSCULOSKELETAL: Normal range of motion. No edema and no tenderness. 2+ distal pulses.  Fetal Monitoring:Baseline: 140, minimal variability, decleration noted. bpm Tocometer: Flat  Labs:  Results for orders placed or performed during the hospital encounter of 10/17/15 (from the past 24 hour(s))  Urinalysis, Routine w reflex microscopic (not at [facility])   Collection Time: 10/17/15 11:02 AM  Result Value Ref Range   Color, Urine YELLOW YELLOW   APPearance CLEAR  CLEAR   Specific Gravity, Urine 1.020 1.005 - 1.030   pH 6.0 5.0 - 8.0   Glucose, UA 500 (A) NEGATIVE mg/dL   Hgb urine dipstick LARGE (A) NEGATIVE   Bilirubin Urine NEGATIVE NEGATIVE   Ketones, ur NEGATIVE NEGATIVE mg/dL   Protein, ur >300 (A) NEGATIVE mg/dL   Nitrite NEGATIVE NEGATIVE   Leukocytes, UA NEGATIVE NEGATIVE  Protein / creatinine ratio, urine   Collection Time: 10/17/15 11:02 AM  Result Value Ref Range   Creatinine, Urine 87.00 mg/dL   Total Protein, Urine 791 mg/dL   Protein Creatinine Ratio 9.09 (H) 0.00 - 0.15 mg/mg[Cre]  Urine microscopic-add on   Collection Time: 10/17/15 11:02 AM  Result Value Ref Range   Squamous Epithelial / LPF 0-5 (A) NONE SEEN   WBC, UA 6-30 0 - 5 WBC/hpf   RBC / HPF NONE SEEN 0 - 5 RBC/hpf   Bacteria, UA RARE (A) NONE SEEN  Glucose, capillary   Collection Time: 10/17/15 11:25 AM  Result Value Ref Range   Glucose-Capillary 189 (H) 65 - 99 mg/dL  CBC   Collection Time: 10/17/15 11:58 AM  Result Value Ref Range   WBC 12.8 (H) 4.0 - 10.5 K/uL   RBC 2.75 (L) 3.87 - 5.11 MIL/uL   Hemoglobin 8.3 (L) 12.0 - 15.0 g/dL   HCT 23.3 (L) 36.0 - 46.0 %   MCV 84.7 78.0  - 100.0 fL   MCH 30.2 26.0 - 34.0 pg   MCHC 35.6 30.0 - 36.0 g/dL   RDW 14.9 11.5 - 15.5 %   Platelets 196 150 - 400 K/uL  Comprehensive metabolic panel   Collection Time: 10/17/15 11:58 AM  Result Value Ref Range   Sodium 135 135 - 145 mmol/L   Potassium 4.9 3.5 - 5.1 mmol/L   Chloride 112 (H) 101 - 111 mmol/L   CO2 19 (L) 22 - 32 mmol/L   Glucose, Bld 180 (H) 65 - 99 mg/dL   BUN 28 (H) 6 - 20 mg/dL   Creatinine, Ser 1.90 (H) 0.44 - 1.00 mg/dL   Calcium 8.2 (L) 8.9 - 10.3 mg/dL   Total Protein 4.6 (L) 6.5 - 8.1 g/dL   Albumin 1.4 (L) 3.5 - 5.0 g/dL   AST 33 15 - 41 U/L   ALT 28 14 - 54 U/L   Alkaline Phosphatase 57 38 - 126 U/L   Total Bilirubin 0.6 0.3 - 1.2 mg/dL   GFR calc non Af Amer 35 (L) >60 mL/min   GFR calc Af Amer 40 (L) >60 mL/min   Anion gap 4 (L) 5 - 15  Results for orders placed or performed in visit on 10/17/15 (from the past 24 hour(s))  POCT urinalysis dip (device)   Collection Time: 10/17/15  9:59 AM  Result Value Ref Range   Glucose, UA 500 (A) NEGATIVE mg/dL   Bilirubin Urine NEGATIVE NEGATIVE   Ketones, ur NEGATIVE NEGATIVE mg/dL   Specific Gravity, Urine 1.020 1.005 - 1.030   Hgb urine dipstick MODERATE (A) NEGATIVE   pH 6.0 5.0 - 8.0   Protein, ur >=300 (A) NEGATIVE mg/dL   Urobilinogen, UA 0.2 0.0 - 1.0 mg/dL   Nitrite NEGATIVE NEGATIVE   Leukocytes, UA NEGATIVE NEGATIVE    Imaging Studies: [place] [facility] Ob Follow Up  Result Date: 09/19/2015 OBSTETRICAL ULTRASOUND: This exam was performed within a [facility]. The OB US report was generated in the AS system, and faxed to the ordering physician.  This report is  available in the [facility]. See the AS Obstetric US report via the Image Link.    Assessment and Plan: Patient Active Problem List   Diagnosis Date Noted  . Chronic hypertension 10/17/2015  . Symptomatic anemia 09/11/2015  . Anemia affecting pregnancy in second trimester, antepartum 08/12/2015  . Diabetic nephropathy  ([place]) 08/07/2015  . CKD (chronic kidney disease) stage 3, GFR 30-59 ml/min 08/07/2015  . Diabetic retinopathy ([place]) 07/24/2015  . Chronic hypertension during pregnancy, antepartum 07/22/2015  . Anemia, chronic disease 07/17/2015  . T1DM - Type F 06/16/2015  . Supervision of high risk pregnancy, antepartum 06/16/2015   Admit to Antenatal Routine antenatal care  #1. CHTN: Pt with Cr increased from 1.4 at baseline to  1.9 today. Pr/Cr ratio increased from 6->9. BP did not respond to labetolol 468m in MAU. Will start IV and start hydralazine protocol. Placed on scheduled procardia 358mBID starting this PM. #2: DM class F: will increase detemir from 8 units in AM and 12 units PM to 18 units BID. Continue humalog coverage.  #3: Betamethasone given - expect to increase sugars, but given severe increase in Cr may need to deliver early.  #4: non-reassuring FHTs, BPP check today and was 8/8   NiJacquiline DoeMD  OB Fellow Faculty Practice, [facility]

## 2015-10-18 ENCOUNTER — Inpatient Hospital Stay (HOSPITAL_COMMUNITY)
Admission: RE | Admit: 2015-10-18 | Discharge: 2015-10-18 | Disposition: A | Discharge status: Home / Self Care | Payer: BLUE CROSS/BLUE SHIELD | Source: Ambulatory Visit | Attending: Obstetrics and Gynecology | Admitting: Obstetrics and Gynecology

## 2015-10-18 ENCOUNTER — Inpatient Hospital Stay (HOSPITAL_COMMUNITY): Payer: BLUE CROSS/BLUE SHIELD

## 2015-10-18 ENCOUNTER — Encounter (HOSPITAL_COMMUNITY): Payer: BLUE CROSS/BLUE SHIELD

## 2015-10-18 DIAGNOSIS — O24012 Pre-existing diabetes mellitus, type 1, in pregnancy, second trimester: Secondary | ICD-10-CM

## 2015-10-18 DIAGNOSIS — O10212 Pre-existing hypertensive chronic kidney disease complicating pregnancy, second trimester: Principal | ICD-10-CM

## 2015-10-18 LAB — BASIC METABOLIC PANEL
ANION GAP: 5 (ref 5–15)
BUN: 29 mg/dL — ABNORMAL HIGH (ref 6–20)
CHLORIDE: 109 mmol/L (ref 101–111)
CO2: 18 mmol/L — AB (ref 22–32)
Calcium: 8 mg/dL — ABNORMAL LOW (ref 8.9–10.3)
Creatinine, Ser: 1.94 mg/dL — ABNORMAL HIGH (ref 0.44–1.00)
GFR calc non Af Amer: 34 mL/min — ABNORMAL LOW (ref >60)
GFR, EST AFRICAN AMERICAN: 39 mL/min — AB (ref >60)
Glucose, Bld: 231 mg/dL — ABNORMAL HIGH (ref 65–99)
POTASSIUM: 4.7 mmol/L (ref 3.5–5.1)
Sodium: 132 mmol/L — ABNORMAL LOW (ref 135–145)

## 2015-10-18 LAB — GLUCOSE, CAPILLARY
GLUCOSE-CAPILLARY: 114 mg/dL — AB (ref 65–99)
GLUCOSE-CAPILLARY: 114 mg/dL — AB (ref 65–99)
GLUCOSE-CAPILLARY: 125 mg/dL — AB (ref 65–99)
GLUCOSE-CAPILLARY: 132 mg/dL — AB (ref 65–99)
Glucose-Capillary: 247 mg/dL — ABNORMAL HIGH (ref 65–99)
Glucose-Capillary: 207 mg/dL — ABNORMAL HIGH (ref 65–99)
Glucose-Capillary: 142 mg/dL — ABNORMAL HIGH (ref 65–99)
Glucose-Capillary: 125 mg/dL — ABNORMAL HIGH (ref 65–99)
Glucose-Capillary: 120 mg/dL — ABNORMAL HIGH (ref 65–99)
Glucose-Capillary: 128 mg/dL — ABNORMAL HIGH (ref 65–99)
Glucose-Capillary: 163 mg/dL — ABNORMAL HIGH (ref 65–99)
Glucose-Capillary: 165 mg/dL — ABNORMAL HIGH (ref 65–99)

## 2015-10-18 LAB — COMPREHENSIVE METABOLIC PANEL
ALBUMIN: 1.4 g/dL — AB (ref 3.5–5.0)
ALT: 24 U/L (ref 14–54)
AST: 23 U/L (ref 15–41)
Alkaline Phosphatase: 49 U/L (ref 38–126)
Anion gap: 5 (ref 5–15)
BUN: 28 mg/dL — AB (ref 6–20)
CHLORIDE: 110 mmol/L (ref 101–111)
CO2: 19 mmol/L — AB (ref 22–32)
Calcium: 8.1 mg/dL — ABNORMAL LOW (ref 8.9–10.3)
Creatinine, Ser: 1.93 mg/dL — ABNORMAL HIGH (ref 0.44–1.00)
GFR calc Af Amer: 40 mL/min — ABNORMAL LOW (ref >60)
GFR calc non Af Amer: 34 mL/min — ABNORMAL LOW (ref >60)
GLUCOSE: 123 mg/dL — AB (ref 65–99)
POTASSIUM: 4.5 mmol/L (ref 3.5–5.1)
Sodium: 134 mmol/L — ABNORMAL LOW (ref 135–145)
Total Bilirubin: 0.3 mg/dL (ref 0.3–1.2)
Total Protein: 4.9 g/dL — ABNORMAL LOW (ref 6.5–8.1)

## 2015-10-18 LAB — MAGNESIUM
Magnesium: 4.1 mg/dL — ABNORMAL HIGH (ref 1.7–2.4)
Magnesium: 4.6 mg/dL — ABNORMAL HIGH (ref 1.7–2.4)

## 2015-10-18 LAB — CBC
HCT: 21.4 % — ABNORMAL LOW (ref 36.0–46.0)
Hemoglobin: 7.7 g/dL — ABNORMAL LOW (ref 12.0–15.0)
MCH: 31.2 pg (ref 26.0–34.0)
MCHC: 36 g/dL (ref 30.0–36.0)
MCV: 86.6 fL (ref 78.0–100.0)
Platelets: 221 10*3/uL (ref 150–400)
RBC: 2.47 MIL/uL — ABNORMAL LOW (ref 3.87–5.11)
RDW: 14.1 % (ref 11.5–15.5)
WBC: 16.6 10*3/uL — ABNORMAL HIGH (ref 4.0–10.5)

## 2015-10-18 MED ORDER — INSULIN ASPART 100 UNIT/ML ~~LOC~~ SOLN
4.0000 [IU] | Freq: Three times a day (TID) | SUBCUTANEOUS | Status: DC
  Administered 2015-10-18: 4 [IU] via SUBCUTANEOUS

## 2015-10-18 MED ORDER — DEXTROSE IN LACTATED RINGERS 5 % IV SOLN
INTRAVENOUS | Status: DC
  Administered 2015-10-18 – 2015-10-19 (×2): via INTRAVENOUS

## 2015-10-18 MED ORDER — BETAMETHASONE SOD PHOS & ACET 6 (3-3) MG/ML IJ SUSP
12.0000 mg | Freq: Once | INTRAMUSCULAR | Status: AC
  Administered 2015-10-18: 12 mg via INTRAMUSCULAR
  Filled 2015-10-18: qty 2

## 2015-10-18 MED ORDER — HYDRALAZINE HCL 20 MG/ML IJ SOLN: 5.0000 mg | INTRAMUSCULAR | Status: DC | PRN

## 2015-10-18 MED ORDER — FUROSEMIDE 10 MG/ML IJ SOLN
20.0000 mg | Freq: Once | INTRAMUSCULAR | Status: AC
  Administered 2015-10-18: 20 mg via INTRAVENOUS
  Filled 2015-10-18: qty 2

## 2015-10-18 MED ORDER — INSULIN DETEMIR 100 UNIT/ML ~~LOC~~ SOLN
23.0000 [IU] | Freq: Two times a day (BID) | SUBCUTANEOUS | Status: DC
  Administered 2015-10-18: 23 [IU] via SUBCUTANEOUS
  Filled 2015-10-18: qty 0.23

## 2015-10-18 MED ORDER — SODIUM CHLORIDE 0.9 % IV SOLN
INTRAVENOUS | Status: DC
  Administered 2015-10-18: 0.5 [IU]/h via INTRAVENOUS
  Administered 2015-10-18: 0.6 [IU]/h via INTRAVENOUS
  Administered 2015-10-18: 1.4 [IU]/h via INTRAVENOUS
  Administered 2015-10-18: 3.1 [IU]/h via INTRAVENOUS
  Filled 2015-10-18: qty 2.5

## 2015-10-18 MED ORDER — MAGNESIUM SULFATE BOLUS VIA INFUSION
4.0000 g | Freq: Once | INTRAVENOUS | Status: AC
  Administered 2015-10-18: 4 g via INTRAVENOUS
  Filled 2015-10-18: qty 500

## 2015-10-18 MED ORDER — MAGNESIUM SULFATE 50 % IJ SOLN
1.0000 g/h | INTRAVENOUS | Status: DC
  Administered 2015-10-18: 1 g/h via INTRAVENOUS
  Filled 2015-10-18: qty 80

## 2015-10-18 MED ORDER — LACTATED RINGERS IV SOLN
INTRAVENOUS | Status: DC
  Administered 2015-10-18 – 2015-10-19 (×2): via INTRAVENOUS

## 2015-10-18 MED ORDER — LABETALOL HCL 5 MG/ML IV SOLN
20.0000 mg | INTRAVENOUS | Status: DC | PRN
Start: 2015-10-18 — End: 2015-10-19

## 2015-10-18 MED ORDER — INSULIN DETEMIR 100 UNIT/ML ~~LOC~~ SOLN
23.0000 [IU] | Freq: Two times a day (BID) | SUBCUTANEOUS | Status: DC
  Filled 2015-10-18: qty 0.23

## 2015-10-18 NOTE — Consult Note (Signed)
Renal Service Consult Note [place] 10/18/2015 [place] D Requesting Physician:  Dr. Nehemiah Settle  Reason for Consult:  T1DM patient w CKD, neph range proteinuria and HTN HPI: The patient is a 30 y.o. year-old with hx of T1DM onset age 39.  Seen in May '17 at 11 wks IUP with Cr 1.5, proteinuria , Alb 1.9. UPC ratio then was 6 gm.  Now patient admitted for progressive LE edema, rising Cr of 1.9 and serum albumin low at 1.6.  She has gross diffuse swelling of the legs and BP's have been high.  She is getting po Procardia 30 bid , and prn IV meds which haven't been required. She is 27 weeks IUP and getting corticosteroids in preparation for possible delivery soon.    Patient w/o specific c/o's.  No CP, cough or SOB, no abd pain.  +leg and hand swelling.   Current meds: betamethsone acetate '12mg'$  today and yest, lovenox, pepcid, insulin, labetalol 200 mg po , ProcardiaXL 30 bid, IV Mg 4g bolus today, MVI, LR at 87.5 ml/min, MgSO4  40 gm in 500 mL LR at 12.5 ml/hr    ROS  denies CP  no joint pain   no HA  no blurry vision  no rash  no diarrhea  no nausea/ vomiting  no dysuria  no difficulty voiding  no change in urine color    Past Medical History  Past Medical History:  Diagnosis Date  . Anemia   . Chronic kidney disease   . Diabetes mellitus    diagnosed at age 77  . Diabetic retinopathy ([place])   . Hypertension    Past Surgical History  Past Surgical History:  Procedure Laterality Date  . lipo suction  2015  . TONSILLECTOMY    . TONSILLECTOMY AND ADENOIDECTOMY     Family History  Family History  Problem Relation Age of Onset  . Cancer Maternal Grandmother     colon and breast  . Diabetes Maternal Grandfather   . Diabetes Mother   . Diabetes Father   . Diabetes Paternal Grandmother   . Diabetes Paternal Grandfather    Social History  reports that she has never smoked. She has never used smokeless tobacco. She reports that she does  not drink alcohol or use drugs. Allergies  Allergies  Allergen Reactions  . Iodine Rash  . Nickel Rash   Home medications Prior to Admission medications   Medication Sig Start Date End Date Taking? Authorizing Provider  acetaminophen (TYLENOL) 500 MG tablet Take 500 mg by mouth every 6 (six) hours as needed for mild pain or headache.   Yes Historical Provider, MD  aspirin 81 MG chewable tablet Chew 81 mg by mouth daily.   Yes Historical Provider, MD  diphenhydrAMINE (BENADRYL) 25 MG tablet Take 25 mg by mouth every 6 (six) hours as needed for itching or allergies.    Yes Historical Provider, MD  enoxaparin (LOVENOX) 40 MG/0.4ML injection Inject 40 mg into the skin at bedtime.   Yes Historical Provider, MD  famotidine (PEPCID) 20 MG tablet Take 1 tablet (20 mg total) by mouth 2 (two) times daily. 06/28/15  Yes Vanessa Kick, MD  Insulin Detemir (LEVEMIR) 100 UNIT/ML Pen Inject 8 units twice daily. Patient taking differently: Inject 8-12 Units into the skin 2 (two) times daily. Inject 8 units in the morning and 12 units at night 08/17/15  Yes Elayne Snare, MD  insulin lispro (HUMALOG KWIKPEN) 100 UNIT/ML KiwkPen Inject 2-11 Units into  the skin 3 (three) times daily with meals. Pt uses per sliding scale.   Yes Historical Provider, MD  Prenatal Vit-Fe Fumarate-FA (MULTIVITAMIN-PRENATAL) 27-0.8 MG TABS tablet Take 1 tablet by mouth daily.    Yes Historical Provider, MD   Liver Function Tests  Recent Labs Lab 10/17/15 1158  AST 33  ALT 28  ALKPHOS 57  BILITOT 0.6  PROT 4.6*  ALBUMIN 1.4*   No results for input(s): LIPASE, AMYLASE in the last 168 hours. CBC  Recent Labs Lab 10/17/15 1158  WBC 12.8*  HGB 8.3*  HCT 23.3*  MCV 84.7  PLT 263   Basic Metabolic Panel  Recent Labs Lab 10/17/15 1158 10/18/15 0533  NA 135 132*  K 4.9 4.7  CL 112* 109  CO2 19* 18*  GLUCOSE 180* 231*  BUN 28* 29*  CREATININE 1.90* 1.94*  CALCIUM 8.2* 8.0*   Iron/TIBC/Ferritin/ %Sat    Component  Value Date/Time   IRON 68 07/22/2015 1051   TIBC 309 07/22/2015 1051   FERRITIN 56 07/22/2015 1051   IRONPCTSAT 22 07/22/2015 1051    Vitals:   10/18/15 1224 10/18/15 1315 10/18/15 1325 10/18/15 1335  BP: (!) 151/93 (!) 155/92 (!) 145/91 (!) 148/88  Pulse: 91 95 95 96  Resp: _0 Temp: 98.4 F (36.9 C) 98.7 F (37.1 C)    TempSrc: Oral Oral    SpO2:  100% 100% 99%  Weight:      Height:       Exam Gen alert, no distress, lying flat No rash, cyanosis or gangrene Sclera anicteric, throat clear  No jvd or bruits Chest clear bilat to the bases, no rales or wheezing RRR no MRG Abd soft ntnd, early gravid appearance, +bs, no ascites GU defer MS no joint changes Ext 2+ diffuse edema of LE's, 1+ edema of hands/ forearms / no wounds or ulcers Neuro is alert, Ox 3 , nf  Na 132 K 4.7  Cr 1.94 eGFR 39   Hb 8  plt 196 LFT's ok  Alb 1.4   Tprot 4.6 UA - >300 prot, 6-30 wbc, no rbc's,  Urine PCR = 9.0 mg/mg  Assessment: 1.  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  Delivery is planned as soon as steroid course is completed.  Has been seen by MFM and BP and anticoag issues are addressed by them.  Can get lasix for diuresis post-delivery if needed.  No other suggestions, agree w current plan 2.  HTN under control w po meds, has prn IV"s ordered but has not required  3.  Type 1 DM 4.  IUP 27 wks 4D 5.  Edema - diffuse edema due to Na retention/ CKD and hypoalbuminemia.    Plan - as above  Kelly Splinter MD [facility] pager [phone]    cell [phone] 10/18/2015, 1:41 PM

## 2015-10-18 NOTE — Progress Notes (Signed)
Patient ID: NAI MAGAZINE, female   DOB: September 09, 1985, 30 y.o.   MRN: [id number] [place] ANTEPARTUM NOTE  KENNETHIA LYKES is a 30 y.o. G1P0 at [redacted]w[redacted]d  who is admitted CHTN with superimposed preeclampsia. Fetal presentation is cephalic. Length of Stay:  1  Days  Subjective: No complaints - no headaches, abdominal pain, nausea, vomiting. Patient reports good fetal movement.   She reports no uterine contractions She reports no bleeding  She reports no loss of fluid per vagina.  Vitals:  Blood pressure 136/84, pulse 100, temperature 98.3 F (36.8 C), temperature source Oral, resp. rate 18, height 5\' 5"  (1.651 m), weight 180 lb (81.6 kg), last menstrual period 04/03/2015. Physical Examination:  General appearance - alert, well appearing, and in no distress Heart: regular rate, no murmur Lungs: clear to auscultation bilaterally, no wheezing.  Fundal Height:  size equals dates Extremities: extremities normal, atraumatic, no cyanosis or edema with DTRs 2+ bilaterally Membranes:intact  Fetal Monitoring:  Baseline: 140 bpm, Variability: Good {> 6 bpm), Accelerations: Non-reactive but appropriate for gestational age and Decelerations: Variable: moderate  Labs:  Results for orders placed or performed during the hospital encounter of 10/17/15 (from the past 24 hour(s))  Urinalysis, Routine w reflex microscopic (not at [facility])   Collection Time: 10/17/15 11:02 AM  Result Value Ref Range   Color, Urine YELLOW YELLOW   APPearance CLEAR CLEAR   Specific Gravity, Urine 1.020 1.005 - 1.030   pH 6.0 5.0 - 8.0   Glucose, UA 500 (A) NEGATIVE mg/dL   Hgb urine dipstick LARGE (A) NEGATIVE   Bilirubin Urine NEGATIVE NEGATIVE   Ketones, ur NEGATIVE NEGATIVE mg/dL   Protein, ur >300 (A) NEGATIVE mg/dL   Nitrite NEGATIVE NEGATIVE   Leukocytes, UA NEGATIVE NEGATIVE  Protein / creatinine ratio, urine   Collection Time: 10/17/15 11:02 AM  Result Value Ref Range   Creatinine, Urine 87.00 mg/dL   Total  Protein, Urine 791 mg/dL   Protein Creatinine Ratio 9.09 (H) 0.00 - 0.15 mg/mg[Cre]  Urine microscopic-add on   Collection Time: 10/17/15 11:02 AM  Result Value Ref Range   Squamous Epithelial / LPF 0-5 (A) NONE SEEN   WBC, UA 6-30 0 - 5 WBC/hpf   RBC / HPF NONE SEEN 0 - 5 RBC/hpf   Bacteria, UA RARE (A) NONE SEEN  Glucose, capillary   Collection Time: 10/17/15 11:25 AM  Result Value Ref Range   Glucose-Capillary 189 (H) 65 - 99 mg/dL  CBC   Collection Time: 10/17/15 11:58 AM  Result Value Ref Range   WBC 12.8 (H) 4.0 - 10.5 K/uL   RBC 2.75 (L) 3.87 - 5.11 MIL/uL   Hemoglobin 8.3 (L) 12.0 - 15.0 g/dL   HCT 23.3 (L) 36.0 - 46.0 %   MCV 84.7 78.0 - 100.0 fL   MCH 30.2 26.0 - 34.0 pg   MCHC 35.6 30.0 - 36.0 g/dL   RDW 14.9 11.5 - 15.5 %   Platelets 196 150 - 400 K/uL  Comprehensive metabolic panel   Collection Time: 10/17/15 11:58 AM  Result Value Ref Range   Sodium 135 135 - 145 mmol/L   Potassium 4.9 3.5 - 5.1 mmol/L   Chloride 112 (H) 101 - 111 mmol/L   CO2 19 (L) 22 - 32 mmol/L   Glucose, Bld 180 (H) 65 - 99 mg/dL   BUN 28 (H) 6 - 20 mg/dL   Creatinine, Ser 1.90 (H) 0.44 - 1.00 mg/dL   Calcium 8.2 (L) 8.9 -  10.3 mg/dL   Total Protein 4.6 (L) 6.5 - 8.1 g/dL   Albumin 1.4 (L) 3.5 - 5.0 g/dL   AST 33 15 - 41 U/L   ALT 28 14 - 54 U/L   Alkaline Phosphatase 57 38 - 126 U/L   Total Bilirubin 0.6 0.3 - 1.2 mg/dL   GFR calc non Af Amer 35 (L) >60 mL/min   GFR calc Af Amer 40 (L) >60 mL/min   Anion gap 4 (L) 5 - 15  Type and screen [place]   Collection Time: 10/17/15  3:25 PM  Result Value Ref Range   ABO/RH(D) O POS    Antibody Screen NEG    Sample Expiration 10/20/2015   Glucose, capillary   Collection Time: 10/17/15  5:48 PM  Result Value Ref Range   Glucose-Capillary 210 (H) 65 - 99 mg/dL   Comment 1 Document in Chart   Glucose, capillary   Collection Time: 10/17/15  8:44 PM  Result Value Ref Range   Glucose-Capillary 207 (H) 65 - 99 mg/dL   Glucose, capillary   Collection Time: 10/17/15 10:02 PM  Result Value Ref Range   Glucose-Capillary 222 (H) 65 - 99 mg/dL  Basic metabolic panel   Collection Time: 10/18/15  5:33 AM  Result Value Ref Range   Sodium 132 (L) 135 - 145 mmol/L   Potassium 4.7 3.5 - 5.1 mmol/L   Chloride 109 101 - 111 mmol/L   CO2 18 (L) 22 - 32 mmol/L   Glucose, Bld 231 (H) 65 - 99 mg/dL   BUN 29 (H) 6 - 20 mg/dL   Creatinine, Ser 1.94 (H) 0.44 - 1.00 mg/dL   Calcium 8.0 (L) 8.9 - 10.3 mg/dL   GFR calc non Af Amer 34 (L) >60 mL/min   GFR calc Af Amer 39 (L) >60 mL/min   Anion gap 5 5 - 15  Results for orders placed or performed in visit on 10/17/15 (from the past 24 hour(s))  POCT urinalysis dip (device)   Collection Time: 10/17/15  9:59 AM  Result Value Ref Range   Glucose, UA 500 (A) NEGATIVE mg/dL   Bilirubin Urine NEGATIVE NEGATIVE   Ketones, ur NEGATIVE NEGATIVE mg/dL   Specific Gravity, Urine 1.020 1.005 - 1.030   Hgb urine dipstick MODERATE (A) NEGATIVE   pH 6.0 5.0 - 8.0   Protein, ur >=300 (A) NEGATIVE mg/dL   Urobilinogen, UA 0.2 0.0 - 1.0 mg/dL   Nitrite NEGATIVE NEGATIVE   Leukocytes, UA NEGATIVE NEGATIVE    Imaging Studies:      Medications:  Scheduled . aspirin  81 mg Oral Daily  . betamethasone acetate-betamethasone sodium phosphate  12 mg Intramuscular Q24H  . docusate sodium  100 mg Oral Daily  . enoxaparin  40 mg Subcutaneous QHS  . famotidine  20 mg Oral BID  . insulin aspart  0-20 Units Subcutaneous TID WC  . insulin aspart  4 Units Subcutaneous TID WC  . insulin detemir  23 Units Subcutaneous BID  . NIFEdipine  30 mg Oral BID  . prenatal multivitamin  1 tablet Oral Q1200   I have reviewed the patient's current medications.  ASSESSMENT: Patient Active Problem List   Diagnosis Date Noted  . Nephrotic syndrome 10/17/2015  . Ascites 10/17/2015  . Symptomatic anemia 09/11/2015  . Anemia affecting pregnancy in second trimester, antepartum 08/12/2015  . Diabetic  nephropathy ([place]) 08/07/2015  . CKD (chronic kidney disease) stage 3, GFR 30-59 ml/min 08/07/2015  . Diabetic  retinopathy ([place]) 07/24/2015  . Chronic hypertension during pregnancy, antepartum 07/22/2015  . Anemia, chronic disease 07/17/2015  . T1DM - Type F 06/16/2015  . Supervision of high risk pregnancy, antepartum 06/16/2015    PLAN: 1.  CHTN with superimposed preeclampsia  Continue nifedipine  Consult MFM  Consult neonatology  BMZ second dose this afternoon  Will follow Cr. 2.  Nephrotic Syndrome  Nephrology consulted 3.  Ascites 4.  T1DM  Increase levemir to 23 units BID  Start premeal aspart 4 units with each meal in addition to SSI.  Continue routine antenatal care.   [place], DO 10/18/2015,7:33 AM

## 2015-10-18 NOTE — Progress Notes (Signed)
L&D Note  10/18/2015 - 2:36 PM  30 y.o. G1 [redacted]w[redacted]d. Pregnancy complicated by:  Patient Active Problem List   Diagnosis Date Noted  . Nephrotic syndrome 10/17/2015  . Ascites 10/17/2015  . Symptomatic anemia 09/11/2015  . Anemia affecting pregnancy in second trimester, antepartum 08/12/2015  . Diabetic nephropathy ([place]) 08/07/2015  . CKD (chronic kidney disease) stage 3, GFR 30-59 ml/min 08/07/2015  . Diabetic retinopathy ([place]) 07/24/2015  . Chronic hypertension during pregnancy, antepartum 07/22/2015  . Anemia, chronic disease 07/17/2015  . T1DM - Type F 06/16/2015  . Supervision of high risk pregnancy, antepartum 06/16/2015    Christine Reed is admitted for superimposed pre-eclampsia with severe features (BP, worsening proteinuria and kidney function)   Subjective:  She denies any s/s of pre-x or Mg toxicity  Objective:   Vitals:   10/18/15 1325 10/18/15 1335 10/18/15 1345 10/18/15 1405  BP: (!) 145/91 (!) 148/88 (!) 143/89   Pulse: 95 96 98   Resp: 18 20 18 20   Temp:      TempSrc:      SpO2: 100% 99% 99%   Weight:      Height:        Current Vital Signs 24h Vital Sign Ranges  T 98.7 F (37.1 C) Temp  Avg: 98.6 F (37 C)  Min: 98.3 F (36.8 C)  Max: 99 F (37.2 C)  BP (!) 143/89 BP  Min: 136/84  Max: 161/97  HR 98 Pulse  Avg: 89.7  Min: 80  Max: 100  RR 20 Resp  Avg: 18.5  Min: 16  Max: 20  SaO2 99 %   SpO2  Avg: 99.5 %  Min: 99 %  Max: 100 %       24 Hour I/O Current Shift I/O  Time Ins Outs No intake/output data recorded. 09/06 0701 - 09/06 1900 In: 118.6 [I.V.:118.6] Out: 350 [Urine:350]   FHR: 140 baseline, ? Occasional accels, no decel, mod variability Toco: quiet Gen: NAD, facial edema GU: foley with approx 150mL UOP (clear)  Labs:   Recent Labs Lab 10/17/15 1158  WBC 12.8*  HGB 8.3*  HCT 23.3*  PLT 196    Recent Labs Lab 10/17/15 1158 10/18/15 0533  NA 135 132*  K 4.9 4.7  CL 112* 109  CO2 19* 18*  BUN 28* 29*  CREATININE 1.90*  1.94*  CALCIUM 8.2* 8.0*  PROT 4.6*  --   BILITOT 0.6  --   ALKPHOS 57  --   ALT 28  --   AST 33  --   GLUCOSE 180* 231*   Results for Christine Reed, Christine Reed (MRN [id number]) as of 10/18/2015 14:38  Ref. Range 10/17/2015 20:44 10/17/2015 22:02 10/18/2015 09:04 10/18/2015 12:01 10/18/2015 13:43  Glucose-Capillary Latest Ref Range: 65 - 99 mg/dL 207 (H) 222 (H) 247 (H) 207 (H) 142 (H)    Medications Current Facility-Administered Medications  Medication Dose Route Frequency Provider Last Rate Last Dose  . acetaminophen (TYLENOL) tablet 650 mg  650 mg Oral Q4H PRN Waldemar Dickens, MD   650 mg at 10/18/15 [id number]  . dextrose 5 % in lactated ringers infusion   Intravenous Continuous [facility], DO      . famotidine (PEPCID) tablet 20 mg  20 mg Oral BID Waldemar Dickens, MD   20 mg at 10/18/15 1012  . hydrALAZINE (APRESOLINE) injection 5-10 mg  5-10 mg Intravenous Q20 Min PRN Waldemar Dickens, MD      . insulin regular (  NOVOLIN R,HUMULIN R) 250 Units in sodium chloride 0.9 % 250 mL (1 Units/mL) infusion   Intravenous Continuous [facility], DO      . labetalol (NORMODYNE,TRANDATE) injection 20-40 mg  20-40 mg Intravenous Q10 min PRN Lauralyn Primes [place], DO      . lactated ringers infusion   Intravenous Continuous Aletha Halim, MD 87.5 mL/hr at 10/18/15 1315    . magnesium sulfate 40 g in lactated ringers 500 mL (0.08 g/mL) OB infusion  1 g/hr Intravenous Continuous [facility], DO 12.5 mL/hr at 10/18/15 1346 1 g/hr at 10/18/15 1346  . NIFEdipine (PROCARDIA-XL/ADALAT-CC/NIFEDICAL-XL) 24 hr tablet 30 mg  30 mg Oral BID Waldemar Dickens, MD   30 mg at 10/18/15 1012   Radiology: Elevated dopplers, cephalic, AFI normal EFW [id number] with small AC @ 85, EFW 879gm  Assessment & Plan:  Pt stable *IUP: category I tracing, fetal status reassuring *superimposed pre-x with severe features: for delivery. BMZ #2 given early @ 10am today. Will try to get  24hrs of effect s/p injection before proceeding with delivery. Will continue with CMP/CBC q8h and Mg level q4h and proceed with delivery with any worsening features -see MFM for full details -continue current BP regimen *Preterm: seen by NICU already. Not in labor *DM1: d/w endocrine prior and about to start gtt per protocol *Renal: seen by renal; appreciate recs for postpartum care *PPx: SCDs, lovenox held; last dose @ @ 2200 yesterday (40mg ) *Analgesia: no needs currently  Durene Romans MD Attending Center for [facility]

## 2015-10-18 NOTE — Progress Notes (Signed)
MFM consult, staff note   Severe preeclampsia superimposed on CHTN with renal deterioration: By way of consultation, I spoke to ? Christine Reed ? about the risks of severe preeclampsia in pregnancy. Given that she has persistent severe hypertension/persistent headache/edematous changes, she has superimposed preeclampsia with severe features.   I reviewed hypertension as a cause of uteroplacental insufficiency, with increased risk of IUGR, oligohydramnios, and stillbirth. I told her that her about her diagnosis of preeclampsia, describing the triad of increased blood pressure, proteinuria, and abnormal edema. Finally, I spoke about hypertension increasing the risk of placental abruption, especially in the setting of severe preeclampsia.   I reviewed the essential tenets in the most recent guidelines for management of hypertension in pregnancy in accordance with the [place] of Obstetrics and Gynecology expert opinion. We talked about the medical treatment of hypertension in pregnancy. I told her that beta-blockers and calcium channel blockers are commonly used to treat hypertension in pregnancy, and that both are felt to be safe for use in pregnancy. She should have her blood pressure carefully followed, and her medications adjusted to keep her BP in the target range of around 130-149/70-99 mm/Hg; ie, HTN should be treated (medication adjustment as you have made) for 150/100 or greater measurements for blood pressures are noted owing to known renal disease in this patient and to be consistent with current ACOG/SMFM guidelines (ie, guidelines are 160/110 in absence of renal/cardiac disease during pregnancy).  Chronic Renal Disease with a Creatinine of 1.4 (moderate) baseline, now increased to 1.9 is indicative of end organ involvement: I then explained patients with chronic renal insufficiency are at increased risk for preterm birth, preeclampsia, fetal growth restriction, low birth weight and  perinatal mortality. I discussed the outcome of her pregnancy is directly related to the degree of renal insufficiency. With a creatinine of 1.4, her risks were 48% for preterm delivery, 50-80% for preeclampsia (80% owing to diad of HTN-CRD), 35% for fetal growth restriction, 50% miscarriage/pregnancy loss early, and 36% perinatal mortality. Much of these risks are due to inability to exhibit the normal intravascular volume expansion required to support the growing fetus and maternal well-being, noting at moderate degree of impairment (Cr 1.4), she had a 50% chance of exhibiting attenuated gestational increment in GFR as well as some attenuation of intravascular volume expansion.  Accordingly, her increase of Cr to 1.9 and P:C ratio of 9 with edematous changes effectively categorize her as having renal deterioration, not amenable to expectant management beyond 48 hour to facilitate antenatal corticosteroids for the fetus.    I explained it is also very important to maintain a diastolic blood pressure below 150mmHg to avoid renal deterioration and preterm birth.   Given a creatinine of 1.4, she had a 40% risk for renal deterioration during pregnancy, 20% risk of persistent renal deterioration postpartum, and 2% risk of rapid decline to end stage renal disease requiring dialysis. Therefore, I recommend close monitoring of her blood pressure with aggressive antihypertensive management to keep blood pressures < 150/100.   Hence, I discussed accelerating the course of BMZ and initiating MgSO4 for eclampsia prophylaxis for the mom and CP prophylaxis for the fetus.  Given moderate renal impairment, I recommend strict I/O's with IV fluids restricted to 100cc/hr.  I would give MgSO4 4gm bolus with 1g/hr drip until 24 hours postpartum.  If further renal deterioration occurs, then I would further restrict IV fluids to 75cc/hr.    Given the fetal heart tracing demonstrating sporadic but non-recurrent decelerations  spontaneously, she would likely benefit from a contraction stress test to determine if induction of labor would be appropriate versus proceeding with cesarean delivery.  Of course, if the tracing becomes nonreassuring then cesarean delivery would be indicated regardless of timing.  Impressions:   1. SIUP at [redacted]w[redacted]d 2. Superimposed preeclampsia with severe features of severe HTN requiring adjustment of antihypertensive medication and neurologic symptom of persistent headache 3. renal impairment with evolving renal deterioration due to preeclampsia (unstable for expectant management beyond 48 hrs)  Recommendations: 1. Recommend delivery upon completion of steroids 2. MgSO4 4gm load with 1g/hr gtt until 24 hours postpartum 3. insulin gtt while NPO and through the steroid administration 4. NICU consult 5. ultrasound for EFW (to facilitate NICU counseling) 6. CST prior to beginning IOL.  If positive, then c/s is indicated.  If negative then IOL may be entertained, noting that it is reasonable to consider delivery by cesarean given high risk for failed IOL given severe preeclampsia at <30 weeks.   7. treat severe HTN (150/100) with labetalol, Procardia, or hydralazine as per your discretion 8. would recommend CEFM 9. strict I/O's (foley warranted) 10.  labs q8 hours to trend creatinine and monitor magnesium levels 11. would restrict IV fluids to 100cc/hr provided Cr <2.0 and UOP is appropriate.   If oligouria develops, Cr 2.0 or greater or I/O demonstrate fluid positivity, recommend fluid restriction to 75cc/hr 12. 72 hour postoperative/postpartum inpatient surveillance. 13. If she requires delivery by cesarean, recommend LMWH prophylaxis x 2 weeks (nephrotic syndrome/preeclampsia/pregnancy/surgery increase risk for DVT/PE) 14.  Given severe preeclampsia prior to 34 weeks warranting delivery, recommend antiphospholipid antibody syndrome panel.  If positive, she will require LMWH x 6 weeks postpartum +  repeat APS panel in 12 weeks 15.  reconsult MFM or ask questions as desired, noting we will remain available.  Time Spent: I spent in excess of 60 minutes in consultation with this patient to review records, evaluate her case, and provide her with an adequate discussion and education.  More than 50% of this time was spent in direct face-to-face counseling.  It was a pleasure seeing your patient in the office today.  Thank you for consultation. Please do not hesitate to contact our service for any further questions.   Thank you,  Christine Reed, Christine Cheadle, MD, MS, FACOG Assistant Professor Section of [facility]

## 2015-10-18 NOTE — Consult Note (Signed)
Asked by Dr.Pickens to provide prenatal consultation for patient at risk for preterm delivery due to chronic hypertension with superimposed preeclampsia and poorly controlled class F type 1 diabetes (with nephropathy). Mother is 30 y.o. G1 who is now 27.[redacted] weeks EGA, was admitted yesterday and given BMZ; also on MgSO4 and Procardia.  Maternal condition escalating so 2nd dose of BMZ to be given today and plan is to delivery tomorrow, but may deliver at any time if maternal condition warrants.  Discussed with patient and her mother usual expectations for preterm infant at 50 - [redacted] weeks gestation, including possible needs for DR resuscitation, respiratory support, IV access, and blood products. Projected possible length of stay in NICU until 37 - [redacted] wks EGA and diiscussed advantages of feeding with mother's milk.  She plans to pump postnatally.  Patient and her mother were attentive, had no questions but expressed feeling somewhat overwhelmed.  Thank you for consulting Neonatology.  Total time 30 minutes.  JWimmer, MD

## 2015-10-18 NOTE — Progress Notes (Signed)
EFM removed d/t tech into perform bedside U/S.

## 2015-10-18 NOTE — Progress Notes (Signed)
Patient ID: Christine Reed, female   DOB: 08-06-1985, 30 y.o.   MRN: [id number]  Called Dr. Dwyane Dee, patient's endocrinologist, regarding insulin management and blood sugar management for the patient, due to blood sugars are increasing. Dr. Dwyane Dee was in agreement to start the insulin gtt protocol, and to d/c tonight's dose of long acting insulin (Levemir). Dr. Ronnie Derby partner is on call and will be available for further phone consultation if needed.    Zenda Alpers, DO  OB Fellow Center for [facility], [facility]

## 2015-10-18 NOTE — Progress Notes (Signed)
I spent time with Christine Reed and her SO, Christine Reed as they continue to process that their son, Christine Reed, will be born much earlier than expected. Christine Reed has a good support system, including her mother and great aunt who came at the end of our time together. They were fearful about the uncertainty and what to expect from such an early delivery.  I provided information about the NICU and what to expect and that although this is very unexpected and new to them, our team is accustomed to this.   We will check in on them tomorrow and offer support before/after delivery, but please also page as needs arise.  [place], [place] Pager, [phone] 7:30 PM    10/18/15 1900  Clinical Encounter Type  Visited With Patient  Visit Type Spiritual support  Referral From Nurse  Spiritual Encounters  Spiritual Needs Emotional

## 2015-10-19 ENCOUNTER — Encounter (HOSPITAL_COMMUNITY): Payer: Self-pay | Admitting: Certified Registered Nurse Anesthetist

## 2015-10-19 ENCOUNTER — Inpatient Hospital Stay (HOSPITAL_COMMUNITY): Payer: BLUE CROSS/BLUE SHIELD | Admitting: Certified Registered Nurse Anesthetist

## 2015-10-19 ENCOUNTER — Encounter (HOSPITAL_COMMUNITY)
Admission: AD | Disposition: A | Discharge status: Home / Self Care | Payer: Self-pay | Source: Ambulatory Visit | Attending: Family Medicine

## 2015-10-19 ENCOUNTER — Encounter (HOSPITAL_COMMUNITY): Payer: Self-pay | Admitting: *Deleted

## 2015-10-19 DIAGNOSIS — O9902 Anemia complicating childbirth: Secondary | ICD-10-CM

## 2015-10-19 DIAGNOSIS — D638 Anemia in other chronic diseases classified elsewhere: Secondary | ICD-10-CM

## 2015-10-19 DIAGNOSIS — O2402 Pre-existing diabetes mellitus, type 1, in childbirth: Secondary | ICD-10-CM

## 2015-10-19 DIAGNOSIS — N183 Chronic kidney disease, stage 3 (moderate): Secondary | ICD-10-CM

## 2015-10-19 DIAGNOSIS — O114 Pre-existing hypertension with pre-eclampsia, complicating childbirth: Secondary | ICD-10-CM

## 2015-10-19 DIAGNOSIS — Z7982 Long term (current) use of aspirin: Secondary | ICD-10-CM

## 2015-10-19 DIAGNOSIS — Z794 Long term (current) use of insulin: Secondary | ICD-10-CM

## 2015-10-19 DIAGNOSIS — I129 Hypertensive chronic kidney disease with stage 1 through stage 4 chronic kidney disease, or unspecified chronic kidney disease: Secondary | ICD-10-CM

## 2015-10-19 DIAGNOSIS — E1021 Type 1 diabetes mellitus with diabetic nephropathy: Secondary | ICD-10-CM

## 2015-10-19 DIAGNOSIS — O10212 Pre-existing hypertensive chronic kidney disease complicating pregnancy, second trimester: Secondary | ICD-10-CM

## 2015-10-19 DIAGNOSIS — Z3A27 27 weeks gestation of pregnancy: Secondary | ICD-10-CM

## 2015-10-19 DIAGNOSIS — O1022 Pre-existing hypertensive chronic kidney disease complicating childbirth: Secondary | ICD-10-CM

## 2015-10-19 LAB — COMPREHENSIVE METABOLIC PANEL
ALBUMIN: 1.4 g/dL — AB (ref 3.5–5.0)
ALBUMIN: 1.3 g/dL — AB (ref 3.5–5.0)
ALK PHOS: 48 U/L (ref 38–126)
ALT: 24 U/L (ref 14–54)
ALT: 23 U/L (ref 14–54)
ANION GAP: 8 (ref 5–15)
AST: 23 U/L (ref 15–41)
AST: 21 U/L (ref 15–41)
Alkaline Phosphatase: 41 U/L (ref 38–126)
Anion gap: 5 (ref 5–15)
BILIRUBIN TOTAL: 0.4 mg/dL (ref 0.3–1.2)
BUN: 28 mg/dL — AB (ref 6–20)
BUN: 26 mg/dL — ABNORMAL HIGH (ref 6–20)
CHLORIDE: 109 mmol/L (ref 101–111)
CO2: 20 mmol/L — ABNORMAL LOW (ref 22–32)
CO2: 17 mmol/L — AB (ref 22–32)
CREATININE: 1.87 mg/dL — AB (ref 0.44–1.00)
Calcium: 8 mg/dL — ABNORMAL LOW (ref 8.9–10.3)
Calcium: 7.6 mg/dL — ABNORMAL LOW (ref 8.9–10.3)
Chloride: 110 mmol/L (ref 101–111)
Creatinine, Ser: 1.64 mg/dL — ABNORMAL HIGH (ref 0.44–1.00)
GFR calc Af Amer: 41 mL/min — ABNORMAL LOW (ref >60)
GFR calc non Af Amer: 42 mL/min — ABNORMAL LOW (ref >60)
GFR, EST AFRICAN AMERICAN: 48 mL/min — AB (ref >60)
GFR, EST NON AFRICAN AMERICAN: 36 mL/min — AB (ref >60)
GLUCOSE: 59 mg/dL — AB (ref 65–99)
Glucose, Bld: 124 mg/dL — ABNORMAL HIGH (ref 65–99)
POTASSIUM: 4.5 mmol/L (ref 3.5–5.1)
Potassium: 4.4 mmol/L (ref 3.5–5.1)
SODIUM: 134 mmol/L — AB (ref 135–145)
Sodium: 135 mmol/L (ref 135–145)
TOTAL PROTEIN: 5.2 g/dL — AB (ref 6.5–8.1)
Total Bilirubin: 0.3 mg/dL (ref 0.3–1.2)
Total Protein: 4.5 g/dL — ABNORMAL LOW (ref 6.5–8.1)

## 2015-10-19 LAB — GLUCOSE, CAPILLARY
GLUCOSE-CAPILLARY: 62 mg/dL — AB (ref 65–99)
GLUCOSE-CAPILLARY: 121 mg/dL — AB (ref 65–99)
GLUCOSE-CAPILLARY: 135 mg/dL — AB (ref 65–99)
GLUCOSE-CAPILLARY: 140 mg/dL — AB (ref 65–99)
GLUCOSE-CAPILLARY: 102 mg/dL — AB (ref 65–99)
GLUCOSE-CAPILLARY: 140 mg/dL — AB (ref 65–99)
GLUCOSE-CAPILLARY: 226 mg/dL — AB (ref 65–99)
Glucose-Capillary: 109 mg/dL — ABNORMAL HIGH (ref 65–99)
Glucose-Capillary: 128 mg/dL — ABNORMAL HIGH (ref 65–99)
Glucose-Capillary: 158 mg/dL — ABNORMAL HIGH (ref 65–99)
Glucose-Capillary: 187 mg/dL — ABNORMAL HIGH (ref 65–99)
Glucose-Capillary: 189 mg/dL — ABNORMAL HIGH (ref 65–99)
Glucose-Capillary: 196 mg/dL — ABNORMAL HIGH (ref 65–99)
Glucose-Capillary: 267 mg/dL — ABNORMAL HIGH (ref 65–99)
Glucose-Capillary: 55 mg/dL — ABNORMAL LOW (ref 65–99)
Glucose-Capillary: 65 mg/dL (ref 65–99)
Glucose-Capillary: 65 mg/dL (ref 65–99)
Glucose-Capillary: 66 mg/dL (ref 65–99)
Glucose-Capillary: 86 mg/dL (ref 65–99)
Glucose-Capillary: 89 mg/dL (ref 65–99)
Glucose-Capillary: 91 mg/dL (ref 65–99)

## 2015-10-19 LAB — MAGNESIUM
MAGNESIUM: 8.5 mg/dL — AB (ref 1.7–2.4)
MAGNESIUM: 4.7 mg/dL — AB (ref 1.7–2.4)
MAGNESIUM: 4.7 mg/dL — AB (ref 1.7–2.4)
Magnesium: 5 mg/dL — ABNORMAL HIGH (ref 1.7–2.4)
Magnesium: 5.5 mg/dL — ABNORMAL HIGH (ref 1.7–2.4)

## 2015-10-19 LAB — CBC
HEMATOCRIT: 22 % — AB (ref 36.0–46.0)
HEMATOCRIT: 21.2 % — AB (ref 36.0–46.0)
HEMOGLOBIN: 7.7 g/dL — AB (ref 12.0–15.0)
Hemoglobin: 8 g/dL — ABNORMAL LOW (ref 12.0–15.0)
MCH: 30.3 pg (ref 26.0–34.0)
MCH: 30.7 pg (ref 26.0–34.0)
MCHC: 36.4 g/dL — AB (ref 30.0–36.0)
MCHC: 36.3 g/dL — ABNORMAL HIGH (ref 30.0–36.0)
MCV: 83.3 fL (ref 78.0–100.0)
MCV: 84.5 fL (ref 78.0–100.0)
PLATELETS: 247 10*3/uL (ref 150–400)
Platelets: 245 10*3/uL (ref 150–400)
RBC: 2.64 MIL/uL — ABNORMAL LOW (ref 3.87–5.11)
RBC: 2.51 MIL/uL — AB (ref 3.87–5.11)
RDW: 14.6 % (ref 11.5–15.5)
RDW: 14.8 % (ref 11.5–15.5)
WBC: 17.6 10*3/uL — ABNORMAL HIGH (ref 4.0–10.5)
WBC: 17.6 10*3/uL — ABNORMAL HIGH (ref 4.0–10.5)

## 2015-10-19 LAB — PREPARE RBC (CROSSMATCH)

## 2015-10-19 SURGERY — Surgical Case
Anesthesia: Spinal

## 2015-10-19 SURGERY — Surgical Case
Anesthesia: Regional

## 2015-10-19 MED ORDER — INSULIN ASPART 100 UNIT/ML ~~LOC~~ SOLN: 0.0000 [IU] | Freq: Every day | SUBCUTANEOUS | Status: DC

## 2015-10-19 MED ORDER — SIMETHICONE 80 MG PO CHEW
80.0000 mg | CHEWABLE_TABLET | Freq: Three times a day (TID) | ORAL | Status: DC
  Administered 2015-10-20 – 2015-10-22 (×7): 80 mg via ORAL
  Filled 2015-10-19 (×6): qty 1

## 2015-10-19 MED ORDER — DIBUCAINE 1 % RE OINT: 1.0000 "application " | TOPICAL_OINTMENT | RECTAL | Status: DC | PRN

## 2015-10-19 MED ORDER — INSULIN ASPART 100 UNIT/ML ~~LOC~~ SOLN
0.0000 [IU] | Freq: Three times a day (TID) | SUBCUTANEOUS | Status: DC
  Administered 2015-10-20 – 2015-10-21 (×3): 2 [IU] via SUBCUTANEOUS

## 2015-10-19 MED ORDER — FUROSEMIDE 10 MG/ML IJ SOLN
INTRAMUSCULAR | Status: AC
  Filled 2015-10-19: qty 2

## 2015-10-19 MED ORDER — MEPERIDINE HCL 25 MG/ML IJ SOLN: 6.2500 mg | INTRAMUSCULAR | Status: DC | PRN

## 2015-10-19 MED ORDER — SODIUM CHLORIDE 0.9 % IV SOLN
INTRAVENOUS | Status: DC | PRN
  Administered 2015-10-19: 16:00:00 via INTRAVENOUS

## 2015-10-19 MED ORDER — PHENYLEPHRINE HCL 10 MG/ML IJ SOLN
INTRAVENOUS | Status: DC | PRN
  Administered 2015-10-19: 30 ug/min via INTRAVENOUS

## 2015-10-19 MED ORDER — HYDROMORPHONE HCL 1 MG/ML IJ SOLN
0.2500 mg | INTRAMUSCULAR | Status: DC | PRN
Start: 2015-10-19 — End: 2015-10-19

## 2015-10-19 MED ORDER — NALOXONE HCL 0.4 MG/ML IJ SOLN: 0.4000 mg | INTRAMUSCULAR | Status: DC | PRN

## 2015-10-19 MED ORDER — SENNOSIDES-DOCUSATE SODIUM 8.6-50 MG PO TABS
2.0000 | ORAL_TABLET | ORAL | Status: DC
  Administered 2015-10-19 – 2015-10-21 (×3): 2 via ORAL
  Filled 2015-10-19 (×3): qty 2

## 2015-10-19 MED ORDER — SODIUM CHLORIDE 0.9 % IR SOLN
Status: DC | PRN
  Administered 2015-10-19 (×2): 1

## 2015-10-19 MED ORDER — NALOXONE HCL 2 MG/2ML IJ SOSY: 1.0000 ug/kg/h | PREFILLED_SYRINGE | INTRAVENOUS | Status: DC | PRN

## 2015-10-19 MED ORDER — PROMETHAZINE HCL 25 MG/ML IJ SOLN: 6.2500 mg | INTRAMUSCULAR | Status: DC | PRN

## 2015-10-19 MED ORDER — SOD CITRATE-CITRIC ACID 500-334 MG/5ML PO SOLN
ORAL | Status: AC
Start: 2015-10-19 — End: 2015-10-20
  Filled 2015-10-19: qty 15

## 2015-10-19 MED ORDER — MAGNESIUM SULFATE 50 % IJ SOLN
1.0000 g/h | INTRAVENOUS | Status: AC
  Administered 2015-10-19: 1 g/h via INTRAVENOUS
  Filled 2015-10-19: qty 80

## 2015-10-19 MED ORDER — NALBUPHINE HCL 10 MG/ML IJ SOLN: 5.0000 mg | INTRAMUSCULAR | Status: DC | PRN

## 2015-10-19 MED ORDER — FENTANYL CITRATE (PF) 100 MCG/2ML IJ SOLN
INTRAMUSCULAR | Status: DC | PRN
  Administered 2015-10-19: 20 ug via INTRATHECAL

## 2015-10-19 MED ORDER — KETOROLAC TROMETHAMINE 30 MG/ML IJ SOLN: 30.0000 mg | Freq: Four times a day (QID) | INTRAMUSCULAR | Status: DC | PRN

## 2015-10-19 MED ORDER — MENTHOL 3 MG MT LOZG: 1.0000 | LOZENGE | OROMUCOSAL | Status: DC | PRN

## 2015-10-19 MED ORDER — DEXTROSE IN LACTATED RINGERS 5 % IV SOLN
INTRAVENOUS | Status: DC
  Administered 2015-10-19: 21:00:00 via INTRAVENOUS

## 2015-10-19 MED ORDER — DEXTROSE IN LACTATED RINGERS 5 % IV SOLN
INTRAVENOUS | Status: DC | PRN
Start: 2015-10-19 — End: 2015-10-19
  Administered 2015-10-19: 15:00:00 via INTRAVENOUS

## 2015-10-19 MED ORDER — ACETAMINOPHEN 500 MG PO TABS
1000.0000 mg | ORAL_TABLET | Freq: Four times a day (QID) | ORAL | Status: AC
  Administered 2015-10-19 – 2015-10-20 (×3): 1000 mg via ORAL
  Filled 2015-10-19 (×3): qty 2

## 2015-10-19 MED ORDER — FUROSEMIDE 10 MG/ML IJ SOLN
INTRAMUSCULAR | Status: DC | PRN
  Administered 2015-10-19: 20 mg via INTRAVENOUS

## 2015-10-19 MED ORDER — SCOPOLAMINE 1 MG/3DAYS TD PT72: MEDICATED_PATCH | TRANSDERMAL | Status: DC | PRN

## 2015-10-19 MED ORDER — MORPHINE SULFATE-NACL 0.5-0.9 MG/ML-% IV SOSY
PREFILLED_SYRINGE | INTRAVENOUS | Status: AC
  Filled 2015-10-19: qty 1

## 2015-10-19 MED ORDER — OXYTOCIN 10 UNIT/ML IJ SOLN
INTRAMUSCULAR | Status: AC
  Filled 2015-10-19: qty 4

## 2015-10-19 MED ORDER — OXYTOCIN 10 UNIT/ML IJ SOLN
INTRAMUSCULAR | Status: DC | PRN
  Administered 2015-10-19: 40 [IU] via INTRAVENOUS

## 2015-10-19 MED ORDER — SODIUM CHLORIDE 0.9 % IV SOLN
INTRAVENOUS | Status: DC
  Administered 2015-10-19: 0.4 [IU]/h via INTRAVENOUS
  Administered 2015-10-19: 0.8 [IU]/h via INTRAVENOUS
  Administered 2015-10-19: 6.2 [IU]/h via INTRAVENOUS
  Administered 2015-10-19: 3.3 [IU]/h via INTRAVENOUS
  Administered 2015-10-20: 4.1 [IU]/h via INTRAVENOUS
  Administered 2015-10-20: 6.4 [IU]/h via INTRAVENOUS
  Administered 2015-10-20: 0.5 [IU]/h via INTRAVENOUS
  Administered 2015-10-20: 0.1 [IU]/h via INTRAVENOUS
  Administered 2015-10-20: 2.1 [IU]/h via INTRAVENOUS
  Filled 2015-10-19: qty 2.5

## 2015-10-19 MED ORDER — NALBUPHINE HCL 10 MG/ML IJ SOLN: 5.0000 mg | Freq: Once | INTRAMUSCULAR | Status: DC | PRN

## 2015-10-19 MED ORDER — ONDANSETRON HCL 4 MG/2ML IJ SOLN
INTRAMUSCULAR | Status: AC
  Filled 2015-10-19: qty 2

## 2015-10-19 MED ORDER — ACETAMINOPHEN 10 MG/ML IV SOLN
1000.0000 mg | Freq: Once | INTRAVENOUS | Status: AC
  Administered 2015-10-19: 1000 mg via INTRAVENOUS
  Filled 2015-10-19: qty 100

## 2015-10-19 MED ORDER — LACTATED RINGERS IV SOLN
INTRAVENOUS | Status: DC
  Administered 2015-10-19: 19:00:00 via INTRAVENOUS

## 2015-10-19 MED ORDER — SCOPOLAMINE 1 MG/3DAYS TD PT72
MEDICATED_PATCH | TRANSDERMAL | Status: DC | PRN
  Administered 2015-10-19: 1 via TRANSDERMAL

## 2015-10-19 MED ORDER — IBUPROFEN 600 MG PO TABS: 600.0000 mg | ORAL_TABLET | Freq: Four times a day (QID) | ORAL | Status: DC

## 2015-10-19 MED ORDER — WITCH HAZEL-GLYCERIN EX PADS: 1.0000 "application " | MEDICATED_PAD | CUTANEOUS | Status: DC | PRN

## 2015-10-19 MED ORDER — ZOLPIDEM TARTRATE 5 MG PO TABS: 5.0000 mg | ORAL_TABLET | Freq: Every evening | ORAL | Status: DC | PRN

## 2015-10-19 MED ORDER — SCOPOLAMINE 1 MG/3DAYS TD PT72: 1.0000 | MEDICATED_PATCH | Freq: Once | TRANSDERMAL | Status: DC

## 2015-10-19 MED ORDER — INSULIN DETEMIR 100 UNIT/ML ~~LOC~~ SOLN
4.0000 [IU] | Freq: Two times a day (BID) | SUBCUTANEOUS | Status: DC
  Filled 2015-10-19 (×2): qty 0.04

## 2015-10-19 MED ORDER — SIMETHICONE 80 MG PO CHEW
80.0000 mg | CHEWABLE_TABLET | ORAL | Status: DC
  Administered 2015-10-19 – 2015-10-21 (×3): 80 mg via ORAL
  Filled 2015-10-19 (×3): qty 1

## 2015-10-19 MED ORDER — CEFAZOLIN SODIUM-DEXTROSE 2-4 GM/100ML-% IV SOLN
INTRAVENOUS | Status: AC
  Filled 2015-10-19: qty 100

## 2015-10-19 MED ORDER — MORPHINE SULFATE (PF) 0.5 MG/ML IJ SOLN
INTRAMUSCULAR | Status: DC | PRN
  Administered 2015-10-19: .2 mg via INTRATHECAL

## 2015-10-19 MED ORDER — BUPIVACAINE IN DEXTROSE 0.75-8.25 % IT SOLN
INTRATHECAL | Status: DC | PRN
  Administered 2015-10-19: 1.4 mL via INTRATHECAL

## 2015-10-19 MED ORDER — DIPHENHYDRAMINE HCL 25 MG PO CAPS
25.0000 mg | ORAL_CAPSULE | Freq: Four times a day (QID) | ORAL | Status: DC | PRN
  Administered 2015-10-20: 25 mg via ORAL

## 2015-10-19 MED ORDER — SCOPOLAMINE 1 MG/3DAYS TD PT72
MEDICATED_PATCH | TRANSDERMAL | Status: AC
  Filled 2015-10-19: qty 1

## 2015-10-19 MED ORDER — ACETAMINOPHEN 325 MG PO TABS
650.0000 mg | ORAL_TABLET | ORAL | Status: DC | PRN
  Administered 2015-10-21: 650 mg via ORAL
  Filled 2015-10-19 (×2): qty 2

## 2015-10-19 MED ORDER — KETOROLAC TROMETHAMINE 30 MG/ML IJ SOLN: 30.0000 mg | Freq: Once | INTRAMUSCULAR | Status: DC

## 2015-10-19 MED ORDER — SIMETHICONE 80 MG PO CHEW: 80.0000 mg | CHEWABLE_TABLET | ORAL | Status: DC | PRN

## 2015-10-19 MED ORDER — FENTANYL CITRATE (PF) 100 MCG/2ML IJ SOLN
INTRAMUSCULAR | Status: AC
  Filled 2015-10-19: qty 2

## 2015-10-19 MED ORDER — TETANUS-DIPHTH-ACELL PERTUSSIS 5-2.5-18.5 LF-MCG/0.5 IM SUSP: 0.5000 mL | Freq: Once | INTRAMUSCULAR | Status: DC

## 2015-10-19 MED ORDER — OXYTOCIN 40 UNITS IN LACTATED RINGERS INFUSION - SIMPLE MED: 2.5000 [IU]/h | INTRAVENOUS | Status: AC

## 2015-10-19 MED ORDER — IBUPROFEN 600 MG PO TABS: 600.0000 mg | ORAL_TABLET | Freq: Four times a day (QID) | ORAL | Status: DC | PRN

## 2015-10-19 MED ORDER — ONDANSETRON HCL 4 MG/2ML IJ SOLN
4.0000 mg | Freq: Three times a day (TID) | INTRAMUSCULAR | Status: DC | PRN
Start: 2015-10-19 — End: 2015-10-22

## 2015-10-19 MED ORDER — COCONUT OIL OIL: 1.0000 "application " | TOPICAL_OIL | Status: DC | PRN

## 2015-10-19 MED ORDER — SODIUM CHLORIDE 0.9 % IV SOLN: 10.0000 mL/h | Freq: Once | INTRAVENOUS | Status: DC

## 2015-10-19 MED ORDER — DIPHENHYDRAMINE HCL 50 MG/ML IJ SOLN: 12.5000 mg | INTRAMUSCULAR | Status: DC | PRN

## 2015-10-19 MED ORDER — BUPIVACAINE HCL (PF) 0.5 % IJ SOLN
INTRAMUSCULAR | Status: DC | PRN
  Administered 2015-10-19: 20 mL

## 2015-10-19 MED ORDER — SODIUM CHLORIDE 0.9% FLUSH: 3.0000 mL | INTRAVENOUS | Status: DC | PRN

## 2015-10-19 MED ORDER — INSULIN DETEMIR 100 UNIT/ML ~~LOC~~ SOLN
4.0000 [IU] | Freq: Two times a day (BID) | SUBCUTANEOUS | Status: DC
  Administered 2015-10-20 – 2015-10-22 (×5): 4 [IU] via SUBCUTANEOUS
  Filled 2015-10-19 (×7): qty 0.04

## 2015-10-19 MED ORDER — PRENATAL MULTIVITAMIN CH
1.0000 | ORAL_TABLET | Freq: Every day | ORAL | Status: DC
  Administered 2015-10-20 – 2015-10-21 (×2): 1 via ORAL
  Filled 2015-10-19 (×2): qty 1

## 2015-10-19 MED ORDER — ONDANSETRON HCL 4 MG/2ML IJ SOLN
INTRAMUSCULAR | Status: DC | PRN
  Administered 2015-10-19: 4 mg via INTRAVENOUS

## 2015-10-19 MED ORDER — SOD CITRATE-CITRIC ACID 500-334 MG/5ML PO SOLN
30.0000 mL | Freq: Once | ORAL | Status: AC
  Administered 2015-10-19: 30 mL via ORAL

## 2015-10-19 MED ORDER — BUPIVACAINE HCL (PF) 0.5 % IJ SOLN
INTRAMUSCULAR | Status: AC
  Filled 2015-10-19: qty 30

## 2015-10-19 MED ORDER — CEFAZOLIN SODIUM-DEXTROSE 2-3 GM-% IV SOLR
INTRAVENOUS | Status: DC | PRN
  Administered 2015-10-19: 2 g via INTRAVENOUS

## 2015-10-19 MED ORDER — DIPHENHYDRAMINE HCL 25 MG PO CAPS
25.0000 mg | ORAL_CAPSULE | ORAL | Status: DC | PRN
  Filled 2015-10-19: qty 1

## 2015-10-19 SURGICAL SUPPLY — 41 items
APL SKNCLS STERI-STRIP NONHPOA (GAUZE/BANDAGES/DRESSINGS) ×1
BENZOIN TINCTURE PRP APPL 2/3 (GAUZE/BANDAGES/DRESSINGS) ×2 IMPLANT
CHLORAPREP W/TINT 26ML (MISCELLANEOUS) ×2 IMPLANT
CLAMP CORD UMBIL (MISCELLANEOUS) IMPLANT
CLOTH BEACON ORANGE TIMEOUT ST (SAFETY) ×2 IMPLANT
CLSR STERI-STRIP ANTIMIC 1/2X4 (GAUZE/BANDAGES/DRESSINGS) ×1 IMPLANT
DRAPE C SECTION CLR SCREEN (DRAPES) ×2 IMPLANT
DRSG OPSITE POSTOP 4X10 (GAUZE/BANDAGES/DRESSINGS) ×2 IMPLANT
ELECT REM PT RETURN 9FT ADLT (ELECTROSURGICAL) ×2
ELECTRODE REM PT RTRN 9FT ADLT (ELECTROSURGICAL) ×1 IMPLANT
EXTRACTOR VACUUM M CUP 4 TUBE (SUCTIONS) IMPLANT
GLOVE BIO SURGEON STRL SZ7.5 (GLOVE) ×2 IMPLANT
GLOVE BIOGEL PI IND STRL 7.0 (GLOVE) ×1 IMPLANT
GLOVE BIOGEL PI INDICATOR 7.0 (GLOVE) ×1
GOWN STRL REUS W/TWL 2XL LVL3 (GOWN DISPOSABLE) ×2 IMPLANT
GOWN STRL REUS W/TWL LRG LVL3 (GOWN DISPOSABLE) ×4 IMPLANT
KIT ABG SYR 3ML LUER SLIP (SYRINGE) IMPLANT
NDL HYPO 25X5/8 SAFETYGLIDE (NEEDLE) IMPLANT
NEEDLE HYPO 22GX1.5 SAFETY (NEEDLE) ×2 IMPLANT
NEEDLE HYPO 25X5/8 SAFETYGLIDE (NEEDLE) ×2 IMPLANT
NS IRRIG 1000ML POUR BTL (IV SOLUTION) ×2 IMPLANT
PACK C SECTION WH (CUSTOM PROCEDURE TRAY) ×2 IMPLANT
PAD OB MATERNITY 4.3X12.25 (PERSONAL CARE ITEMS) ×2 IMPLANT
PENCIL SMOKE EVAC W/HOLSTER (ELECTROSURGICAL) ×2 IMPLANT
RTRCTR C-SECT PINK 25CM LRG (MISCELLANEOUS) ×2 IMPLANT
STRIP CLOSURE SKIN 1/2X4 (GAUZE/BANDAGES/DRESSINGS) ×2 IMPLANT
SUT CHROMIC 1 CTX 36 (SUTURE) ×4 IMPLANT
SUT VIC AB 1 CT1 27 (SUTURE) ×4
SUT VIC AB 1 CT1 27XBRD ANTBC (SUTURE) ×2 IMPLANT
SUT VIC AB 2-0 CT1 (SUTURE) ×2 IMPLANT
SUT VIC AB 2-0 CT1 27 (SUTURE) ×2
SUT VIC AB 2-0 CT1 TAPERPNT 27 (SUTURE) ×1 IMPLANT
SUT VIC AB 3-0 CT1 27 (SUTURE) ×4
SUT VIC AB 3-0 CT1 TAPERPNT 27 (SUTURE) ×2 IMPLANT
SUT VIC AB 3-0 SH 27 (SUTURE)
SUT VIC AB 3-0 SH 27X BRD (SUTURE) IMPLANT
SUT VIC AB 4-0 KS 27 (SUTURE) ×2 IMPLANT
SYR BULB IRRIGATION 50ML (SYRINGE) ×2 IMPLANT
SYR CONTROL 10ML LL (SYRINGE) ×2 IMPLANT
TOWEL OR 17X24 6PK STRL BLUE (TOWEL DISPOSABLE) ×2 IMPLANT
TRAY FOLEY CATH SILVER 14FR (SET/KITS/TRAYS/PACK) ×2 IMPLANT

## 2015-10-19 NOTE — Progress Notes (Signed)
OB Attending  Spoke to pt and family about proceeding toward delivery. Pt has previously been counseled by MFM for indications/reasonings for delivery. Methods of delivery reviewed with pt. R/B of each reviewed. Following discussion, pt desires to proceed to delivery via c section. Nursing and anesthesia notified. Will proceed when OR available.

## 2015-10-19 NOTE — Anesthesia Postprocedure Evaluation (Signed)
Anesthesia Post Note  Patient: Christine Reed  Procedure(s) Performed: Procedure(s) (LRB): CESAREAN SECTION (N/A)  Patient location during evaluation: PACU Anesthesia Type: Spinal Level of consciousness: awake Pain management: pain level controlled Vital Signs Assessment: post-procedure vital signs reviewed and stable Respiratory status: spontaneous breathing Cardiovascular status: stable Postop Assessment: no headache, no backache, spinal receding, patient able to bend at knees and no signs of nausea or vomiting Anesthetic complications: no     Last Vitals:  Vitals:   10/19/15 1730 10/19/15 1745  BP: 130/83 131/86  Pulse: 86 81  Resp: 14 13  Temp:      Last Pain:  Vitals:   10/19/15 1705  TempSrc: Oral  PainSc:    Pain Goal: Patients Stated Pain Goal: 3 (10/18/15 0810)               Ryenne Lynam JR,JOHN [place] Flow

## 2015-10-19 NOTE — Op Note (Signed)
Cesarean Section Procedure Note  10/17/2015 - 10/19/2015  7:18 PM  PATIENT:  Christine Reed  30 y.o. female  PRE-OPERATIVE DIAGNOSIS:  CESAREAN SECTION  POST-OPERATIVE DIAGNOSIS:  CESAREAN SECTION  PROCEDURE:  Procedure(s): CESAREAN SECTION (N/A)  SURGEON:  Surgeon(s) and Role:    * Chancy Milroy, MD - Primary  ASSISTANTS: Ree Shay Dereka Lueras, DO  ANESTHESIA:   spinal  EBL:  600 cc  URINE OUTPUT: 200 cc clear urine in foley catheter  IVF: 300 cc LR  BLOOD ADMINISTERED:none  DRAINS: none   LOCAL MEDICATIONS USED:  20cc 0.5% MARCAINE     SPECIMEN:  Source of Specimen:  Placenta  DISPOSITION OF SPECIMEN:  PATHOLOGY   Procedure Details   The patient was seen in the Holding Room. The risks, benefits, complications, treatment options, and expected outcomes were discussed with the patient.  The patient concurred with the proposed plan, giving informed consent.  The site of surgery properly noted/marked. The patient was taken to Operating Room # 9, identified as Christine Reed and the procedure verified as C-Section Delivery. A Time Out was held and the above information confirmed.  After induction of anesthesia, the patient was draped and prepped in the usual sterile manner. A Pfannenstiel incision was made and carried down through the subcutaneous tissue to the fascia. Fascial incision was made and extended transversely. The fascia was separated from the underlying rectus tissue superiorly and inferiorly. The peritoneum was identified and entered. Peritoneal incision was extended longitudinally. The utero-vesical peritoneal reflection was incised transversely and the bladder flap was bluntly freed from the lower uterine segment. A LOW TRANSVERSE uterine incision was made. Delivered from breech footling presentation was a 830 gram (1 lb 13.3 oz) Female with Apgar scores of 3 at one minute and 6 at five minutes. After the umbilical cord was clamped and cut cord blood was obtained for  evaluation. The placenta was removed intact and appeared normal. The uterine outline, tubes and ovaries appeared normal. The uterine incision was closed with running locked sutures of 0 Vicryl. Hemostasis was observed. Lavage was carried out until clear. The fascia was then reapproximated with running sutures of 2.0 and Monocryl. The skin was reapproximated with 4.0 Vicryl.  Instrument, sponge, and needle counts were correct prior the abdominal closure and at the conclusion of the case.   Complications:  None; patient tolerated the procedure well.  COUNTS:  YES  PLAN OF CARE: Admit to inpatient   PATIENT DISPOSITION:  PACU - hemodynamically stable.   BABY DISPOSITION: NICU   Delay start of Pharmacological VTE agent (>24hrs) due to surgical blood loss or risk of bleeding: yes             Disposition: PACU - hemodynamically stable.         Condition: stable   [place], [place] Fellow 10/19/2015 17:54

## 2015-10-19 NOTE — Progress Notes (Signed)
Patient ID: Christine Reed, female   DOB: 1985-08-27, 30 y.o.   MRN: [id number]  ANTEPARTUM PROGRESS NOTE  Christine Reed is a 30 y.o. G1P0 at 20w5dwho is admitted for induction of labor due to Pre-eclamptic toxemia of pregnancy...  Estimated Date of Delivery: 01/13/16 Fetal presentation is cephalic.  Length of Stay:  2 Days. Admitted 10/17/2015  Subjective: Patient reports good fetal movement.  She reports no uterine contractions, no bleeding and no loss of fluid per vagina. Has significant swelling still in legs, hands.   Vitals:  Blood pressure (!) 153/92, pulse 92, temperature 98.3 F (36.8 C), temperature source Oral, resp. rate 20, height 5' 5" (1.651 m), weight 188 lb 11.2 oz (85.6 kg), last menstrual period 04/03/2015, SpO2 99 %. Physical Examination: CONSTITUTIONAL: Well-developed, well-nourished female in no acute distress, obese.  HEENT:  Normocephalic, atraumatic SKIN: Skin is warm and dry. No rash noted.  NLee Vining Alert and oriented to person, place, and time.  PSYCHIATRIC: Normal mood and affect.  CARDIOVASCULAR: Normal heart rate noted, regular rhythm RESPIRATORY: Effort and breath sounds normal, no problems with respiration noted ABDOMEN: Soft, nontender, nondistended, gravid. EXTREMITIES: Pitting edema b/l LE, SCDs in place      Results for orders placed or performed during the hospital encounter of 10/17/15 (from the past 48 hour(s))  Type and screen [facility]    Status: None   Collection Time: 10/17/15  3:25 PM  Result Value Ref Range   ABO/RH(D) O POS    Antibody Screen NEG    Sample Expiration 10/20/2015   Glucose, capillary     Status: Abnormal   Collection Time: 10/17/15  5:48 PM  Result Value Ref Range   Glucose-Capillary 210 (H) 65 - 99 mg/dL   Comment 1 Document in Chart   Glucose, capillary     Status: Abnormal   Collection Time: 10/17/15  8:44 PM  Result Value Ref Range   Glucose-Capillary 207 (H) 65 - 99 mg/dL  Glucose,  capillary     Status: Abnormal   Collection Time: 10/17/15 10:02 PM  Result Value Ref Range   Glucose-Capillary 222 (H) 65 - 99 mg/dL  Basic metabolic panel     Status: Abnormal   Collection Time: 10/18/15  5:33 AM  Result Value Ref Range   Sodium 132 (L) 135 - 145 mmol/L   Potassium 4.7 3.5 - 5.1 mmol/L   Chloride 109 101 - 111 mmol/L   CO2 18 (L) 22 - 32 mmol/L   Glucose, Bld 231 (H) 65 - 99 mg/dL   BUN 29 (H) 6 - 20 mg/dL   Creatinine, Ser 1.94 (H) 0.44 - 1.00 mg/dL   Calcium 8.0 (L) 8.9 - 10.3 mg/dL   GFR calc non Af Amer 34 (L) >60 mL/min   GFR calc Af Amer 39 (L) >60 mL/min    Comment: (NOTE) The eGFR has been calculated using the CKD EPI equation. This calculation has not been validated in all clinical situations. eGFR's persistently <60 mL/min signify possible Chronic Kidney Disease.    Anion gap 5 5 - 15  Glucose, capillary     Status: Abnormal   Collection Time: 10/18/15  9:04 AM  Result Value Ref Range   Glucose-Capillary 247 (H) 65 - 99 mg/dL  Glucose, capillary     Status: Abnormal   Collection Time: 10/18/15 12:01 PM  Result Value Ref Range   Glucose-Capillary 207 (H) 65 - 99 mg/dL  Glucose, capillary  Status: Abnormal   Collection Time: 10/18/15  1:43 PM  Result Value Ref Range   Glucose-Capillary 142 (H) 65 - 99 mg/dL  Glucose, capillary     Status: Abnormal   Collection Time: 10/18/15  2:59 PM  Result Value Ref Range   Glucose-Capillary 114 (H) 65 - 99 mg/dL  Glucose, capillary     Status: Abnormal   Collection Time: 10/18/15  4:05 PM  Result Value Ref Range   Glucose-Capillary 125 (H) 65 - 99 mg/dL  Glucose, capillary     Status: Abnormal   Collection Time: 10/18/15  5:07 PM  Result Value Ref Range   Glucose-Capillary 114 (H) 65 - 99 mg/dL  Glucose, capillary     Status: Abnormal   Collection Time: 10/18/15  6:06 PM  Result Value Ref Range   Glucose-Capillary 120 (H) 65 - 99 mg/dL  Magnesium     Status: Abnormal   Collection Time: 10/18/15   6:56 PM  Result Value Ref Range   Magnesium 4.1 (H) 1.7 - 2.4 mg/dL  Comprehensive metabolic panel     Status: Abnormal   Collection Time: 10/18/15  6:56 PM  Result Value Ref Range   Sodium 134 (L) 135 - 145 mmol/L   Potassium 4.5 3.5 - 5.1 mmol/L   Chloride 110 101 - 111 mmol/L   CO2 19 (L) 22 - 32 mmol/L   Glucose, Bld 123 (H) 65 - 99 mg/dL   BUN 28 (H) 6 - 20 mg/dL   Creatinine, Ser 1.93 (H) 0.44 - 1.00 mg/dL   Calcium 8.1 (L) 8.9 - 10.3 mg/dL   Total Protein 4.9 (L) 6.5 - 8.1 g/dL   Albumin 1.4 (L) 3.5 - 5.0 g/dL   AST 23 15 - 41 U/L   ALT 24 14 - 54 U/L   Alkaline Phosphatase 49 38 - 126 U/L   Total Bilirubin 0.3 0.3 - 1.2 mg/dL   GFR calc non Af Amer 34 (L) >60 mL/min   GFR calc Af Amer 40 (L) >60 mL/min    Comment: (NOTE) The eGFR has been calculated using the CKD EPI equation. This calculation has not been validated in all clinical situations. eGFR's persistently <60 mL/min signify possible Chronic Kidney Disease.    Anion gap 5 5 - 15  CBC     Status: Abnormal   Collection Time: 10/18/15  6:56 PM  Result Value Ref Range   WBC 16.6 (H) 4.0 - 10.5 K/uL   RBC 2.47 (L) 3.87 - 5.11 MIL/uL   Hemoglobin 7.7 (L) 12.0 - 15.0 g/dL   HCT 21.4 (L) 36.0 - 46.0 %   MCV 86.6 78.0 - 100.0 fL   MCH 31.2 26.0 - 34.0 pg   MCHC 36.0 30.0 - 36.0 g/dL   RDW 14.1 11.5 - 15.5 %   Platelets 221 150 - 400 K/uL  Glucose, capillary     Status: Abnormal   Collection Time: 10/18/15  7:09 PM  Result Value Ref Range   Glucose-Capillary 128 (H) 65 - 99 mg/dL  Glucose, capillary     Status: Abnormal   Collection Time: 10/18/15  8:12 PM  Result Value Ref Range   Glucose-Capillary 125 (H) 65 - 99 mg/dL  Glucose, capillary     Status: Abnormal   Collection Time: 10/18/15  9:12 PM  Result Value Ref Range   Glucose-Capillary 132 (H) 65 - 99 mg/dL  Glucose, capillary     Status: Abnormal   Collection Time: 10/18/15 10:13 PM  Result Value  Ref Range   Glucose-Capillary 163 (H) 65 - 99 mg/dL   Glucose, capillary     Status: Abnormal   Collection Time: 10/18/15 11:01 PM  Result Value Ref Range   Glucose-Capillary 165 (H) 65 - 99 mg/dL   Comment 1 Notify RN   Magnesium     Status: Abnormal   Collection Time: 10/18/15 11:04 PM  Result Value Ref Range   Magnesium 4.6 (H) 1.7 - 2.4 mg/dL  Glucose, capillary     Status: Abnormal   Collection Time: 10/19/15 12:13 AM  Result Value Ref Range   Glucose-Capillary 128 (H) 65 - 99 mg/dL  Glucose, capillary     Status: None   Collection Time: 10/19/15  1:07 AM  Result Value Ref Range   Glucose-Capillary 91 65 - 99 mg/dL  Glucose, capillary     Status: None   Collection Time: 10/19/15  2:12 AM  Result Value Ref Range   Glucose-Capillary 65 65 - 99 mg/dL  Magnesium     Status: Abnormal   Collection Time: 10/19/15  2:46 AM  Result Value Ref Range   Magnesium 5.0 (H) 1.7 - 2.4 mg/dL  Comprehensive metabolic panel     Status: Abnormal   Collection Time: 10/19/15  2:46 AM  Result Value Ref Range   Sodium 135 135 - 145 mmol/L   Potassium 4.5 3.5 - 5.1 mmol/L   Chloride 110 101 - 111 mmol/L   CO2 20 (L) 22 - 32 mmol/L   Glucose, Bld 59 (L) 65 - 99 mg/dL   BUN 28 (H) 6 - 20 mg/dL   Creatinine, Ser 1.87 (H) 0.44 - 1.00 mg/dL   Calcium 8.0 (L) 8.9 - 10.3 mg/dL   Total Protein 5.2 (L) 6.5 - 8.1 g/dL   Albumin 1.4 (L) 3.5 - 5.0 g/dL   AST 23 15 - 41 U/L   ALT 24 14 - 54 U/L   Alkaline Phosphatase 48 38 - 126 U/L   Total Bilirubin 0.4 0.3 - 1.2 mg/dL   GFR calc non Af Amer 36 (L) >60 mL/min   GFR calc Af Amer 41 (L) >60 mL/min    Comment: (NOTE) The eGFR has been calculated using the CKD EPI equation. This calculation has not been validated in all clinical situations. eGFR's persistently <60 mL/min signify possible Chronic Kidney Disease.    Anion gap 5 5 - 15  CBC     Status: Abnormal   Collection Time: 10/19/15  2:46 AM  Result Value Ref Range   WBC 17.6 (H) 4.0 - 10.5 K/uL   RBC 2.64 (L) 3.87 - 5.11 MIL/uL   Hemoglobin 8.0  (L) 12.0 - 15.0 g/dL   HCT 22.0 (L) 36.0 - 46.0 %   MCV 83.3 78.0 - 100.0 fL   MCH 30.3 26.0 - 34.0 pg   MCHC 36.4 (H) 30.0 - 36.0 g/dL   RDW 14.6 11.5 - 15.5 %   Platelets 247 150 - 400 K/uL  Glucose, capillary     Status: Abnormal   Collection Time: 10/19/15  2:53 AM  Result Value Ref Range   Glucose-Capillary 55 (L) 65 - 99 mg/dL  Glucose, capillary     Status: None   Collection Time: 10/19/15  3:04 AM  Result Value Ref Range   Glucose-Capillary 66 65 - 99 mg/dL  Glucose, capillary     Status: None   Collection Time: 10/19/15  4:08 AM  Result Value Ref Range   Glucose-Capillary 65 65 - 99 mg/dL  Glucose, capillary     Status: Abnormal   Collection Time: 10/19/15  5:08 AM  Result Value Ref Range   Glucose-Capillary 62 (L) 65 - 99 mg/dL  Glucose, capillary     Status: None   Collection Time: 10/19/15  6:10 AM  Result Value Ref Range   Glucose-Capillary 89 65 - 99 mg/dL  Glucose, capillary     Status: None   Collection Time: 10/19/15  7:05 AM  Result Value Ref Range   Glucose-Capillary 86 65 - 99 mg/dL  Magnesium     Status: Abnormal   Collection Time: 10/19/15  7:06 AM  Result Value Ref Range   Magnesium 5.5 (H) 1.7 - 2.4 mg/dL  Glucose, capillary     Status: Abnormal   Collection Time: 10/19/15  8:18 AM  Result Value Ref Range   Glucose-Capillary 109 (H) 65 - 99 mg/dL  Glucose, capillary     Status: Abnormal   Collection Time: 10/19/15  9:19 AM  Result Value Ref Range   Glucose-Capillary 121 (H) 65 - 99 mg/dL  Glucose, capillary     Status: Abnormal   Collection Time: 10/19/15 10:23 AM  Result Value Ref Range   Glucose-Capillary 135 (H) 65 - 99 mg/dL  Magnesium     Status: Abnormal   Collection Time: 10/19/15 10:42 AM  Result Value Ref Range   Magnesium 8.5 (HH) 1.7 - 2.4 mg/dL    Comment: RESULTS CONFIRMED BY MANUAL DILUTION CRITICAL RESULT CALLED TO, READ BACK BY AND VERIFIED WITH: WALLS,J _0  ON [id number] BY FLEMINGS   Comprehensive metabolic panel      Status: Abnormal   Collection Time: 10/19/15 10:42 AM  Result Value Ref Range   Sodium 134 (L) 135 - 145 mmol/L   Potassium 4.4 3.5 - 5.1 mmol/L   Chloride 109 101 - 111 mmol/L   CO2 17 (L) 22 - 32 mmol/L   Glucose, Bld 124 (H) 65 - 99 mg/dL   BUN 26 (H) 6 - 20 mg/dL   Creatinine, Ser 1.64 (H) 0.44 - 1.00 mg/dL   Calcium 7.6 (L) 8.9 - 10.3 mg/dL   Total Protein 4.5 (L) 6.5 - 8.1 g/dL   Albumin 1.3 (L) 3.5 - 5.0 g/dL   AST 21 15 - 41 U/L   ALT 23 14 - 54 U/L   Alkaline Phosphatase 41 38 - 126 U/L   Total Bilirubin 0.3 0.3 - 1.2 mg/dL   GFR calc non Af Amer 42 (L) >60 mL/min   GFR calc Af Amer 48 (L) >60 mL/min    Comment: (NOTE) The eGFR has been calculated using the CKD EPI equation. This calculation has not been validated in all clinical situations. eGFR's persistently <60 mL/min signify possible Chronic Kidney Disease.    Anion gap 8 5 - 15  CBC     Status: Abnormal   Collection Time: 10/19/15 10:42 AM  Result Value Ref Range   WBC 17.6 (H) 4.0 - 10.5 K/uL   RBC 2.51 (L) 3.87 - 5.11 MIL/uL   Hemoglobin 7.7 (L) 12.0 - 15.0 g/dL   HCT 21.2 (L) 36.0 - 46.0 %   MCV 84.5 78.0 - 100.0 fL   MCH 30.7 26.0 - 34.0 pg   MCHC 36.3 (H) 30.0 - 36.0 g/dL   RDW 14.8 11.5 - 15.5 %   Platelets 245 150 - 400 K/uL  Glucose, capillary     Status: Abnormal   Collection Time: 10/19/15 11:20 AM  Result Value Ref Range   Glucose-Capillary  158 (H) 65 - 99 mg/dL  Glucose, capillary     Status: Abnormal   Collection Time: 10/19/15 12:36 PM  Result Value Ref Range   Glucose-Capillary 196 (H) 65 - 99 mg/dL    [place] Mfm Fetal Bpp Wo Non Stress  Result Date: 10/17/2015 OBSTETRICAL ULTRASOUND: This exam was performed within a [facility]. The OB US report was generated in the AS system, and faxed to the ordering physician.  This report is available in the [facility]. See the AS Obstetric US report via the Image Link.  [place] Mfm Ob Follow Up  Result Date: 10/18/2015 OBSTETRICAL  ULTRASOUND: This exam was performed within a [facility]. The OB US report was generated in the AS system, and faxed to the ordering physician.  This report is available in the [facility]. See the AS Obstetric US report via the Image Link.  [place] Mfm Ua Cord Doppler  Result Date: 10/18/2015 OBSTETRICAL ULTRASOUND: This exam was performed within a [facility]. The OB US report was generated in the AS system, and faxed to the ordering physician.  This report is available in the [facility]. See the AS Obstetric US report via the Image Link.   Current scheduled medications . famotidine  20 mg Oral BID  . NIFEdipine  30 mg Oral BID    I have reviewed the patient's current medications.  ASSESSMENT: Patient Active Problem List   Diagnosis Date Noted  . Nephrotic syndrome 10/17/2015  . Ascites 10/17/2015  . Symptomatic anemia 09/11/2015  . Anemia affecting pregnancy in second trimester, antepartum 08/12/2015  . Diabetic nephropathy ([place]) 08/07/2015  . CKD (chronic kidney disease) stage 3, GFR 30-59 ml/min 08/07/2015  . Diabetic retinopathy ([place]) 07/24/2015  . Chronic hypertension during pregnancy, antepartum 07/22/2015  . Anemia, chronic disease 07/17/2015  . T1DM - Type F 06/16/2015  . Supervision of high risk pregnancy, antepartum 06/16/2015    PLAN: 30 y.o. G1P0 110w5dhere for preE with severe features and GDM C/F.   Plan to start induction today with cytotec. PreE protocol as is, stable Continue insulin gtt and glucostabilizer protocol  EKatherine Basset DO OB Fellow Faculty Practice, [facility]

## 2015-10-19 NOTE — Transfer of Care (Signed)
Immediate Anesthesia Transfer of Care Note  Patient: Christine Reed  Procedure(s) Performed: Procedure(s): CESAREAN SECTION (N/A)  Patient Location: PACU  Anesthesia Type:Spinal  Level of Consciousness: awake, alert  and oriented  Airway & Oxygen Therapy: Patient Spontanous Breathing  Post-op Assessment: Report given to RN and Post -op Vital signs reviewed and stable  Post vital signs: Reviewed and stable  Last Vitals:  Vitals:   10/19/15 1405 10/19/15 1506  BP: (!) 138/92 (!) 154/96  Pulse: 100 (!) 102  Resp: 18   Temp: 37.1 C     Last Pain:  Vitals:   10/19/15 1500  TempSrc:   PainSc: 5       Patients Stated Pain Goal: 3 (84/16/60 6301)  Complications: No apparent anesthesia complications

## 2015-10-19 NOTE — Anesthesia Procedure Notes (Signed)
Spinal  Patient location during procedure: OR Start time: 10/19/2015 3:30 PM End time: 10/19/2015 3:39 PM Staffing Anesthesiologist: Lyn Hollingshead Performed: anesthesiologist  Preanesthetic Checklist Completed: patient identified, surgical consent, pre-op evaluation, timeout performed, IV checked, risks and benefits discussed and monitors and equipment checked Spinal Block Patient position: sitting Prep: site prepped and draped and DuraPrep Patient monitoring: heart rate, cardiac monitor, continuous pulse ox and blood pressure Approach: midline Location: L3-4 Injection technique: single-shot Needle Needle type: Sprotte  Needle gauge: 24 G Needle length: 9 cm Assessment Sensory level: T4 Additional Notes Needed 18G Touhy X 1 to 6cm followed by 24g Sprotte to + CSF

## 2015-10-19 NOTE — Anesthesia Preprocedure Evaluation (Signed)
Anesthesia Evaluation  Patient identified by MRN, date of birth, ID band Patient awake    Reviewed: Allergy & Precautions, H&P , NPO status , Patient's Chart, lab work & pertinent test results  Airway Mallampati: II  TM Distance: >3 FB Neck ROM: full    Dental no notable dental hx.    Pulmonary neg pulmonary ROS,    Pulmonary exam normal        Cardiovascular hypertension, negative cardio ROS Normal cardiovascular exam     Neuro/Psych negative neurological ROS  negative psych ROS   GI/Hepatic negative GI ROS, Neg liver ROS,   Endo/Other  negative endocrine ROSdiabetes  Renal/GU      Musculoskeletal   Abdominal (+) + obese,   Peds  Hematology   Anesthesia Other Findings   Reproductive/Obstetrics (+) Pregnancy                             Anesthesia Physical Anesthesia Plan  ASA: II  Anesthesia Plan: Spinal   Post-op Pain Management:    Induction:   Airway Management Planned:   Additional Equipment:   Intra-op Plan:   Post-operative Plan:   Informed Consent: I have reviewed the patients History and Physical, chart, labs and discussed the procedure including the risks, benefits and alternatives for the proposed anesthesia with the patient or authorized representative who has indicated his/her understanding and acceptance.     Plan Discussed with:   Anesthesia Plan Comments:         Anesthesia Quick Evaluation

## 2015-10-19 NOTE — Progress Notes (Signed)
  [facility] Progress Note   Subjective: feels bad, has a HA. BP's not any different than yesterday.  Going for C-section now.     Vitals:   10/19/15 1023 10/19/15 1119 10/19/15 1202 10/19/15 1405  BP: (!) 152/83 (!) 141/84 (!) 153/92 (!) 138/92  Pulse: 92 94 92 100  Resp: _0 Temp:    98.8 F (37.1 C)  TempSrc:      SpO2:      Weight:      Height:        Inpatient medications: . sodium chloride  10 mL/hr Intravenous Once  . famotidine  20 mg Oral BID  . NIFEdipine  30 mg Oral BID  . sodium citrate-citric acid       . dextrose 5% lactated ringers 75 mL/hr (10/19/15 0930)  . insulin (NOVOLIN-R) infusion 2.6 Units/hr (10/19/15 1403)  . lactated ringers 50 mL/hr at 10/19/15 0300  . magnesium sulfate Stopped (10/19/15 1149)   acetaminophen, hydrALAZINE, labetalol  Exam: Gen alert, no distress, lying flat, facial edema 2+ No rash, cyanosis or gangrene Sclera anicteric, throat clear  No jvd or bruits Chest clear bilat to the bases, no rales or wheezing RRR no MRG Abd soft ntnd, early gravid appearance, +bs, no ascites GU foley w clear urine MS no joint changes Ext 2+ diffuse edema of LE's, 1-2+ edema of arms Neuro is alert, Ox 3 , nf  Na 132 K 4.7  Cr 1.94 eGFR 39   Hb 8  plt 196 LFT's ok  Alb 1.4   Tprot 4.6 UA - >300 prot, 6-30 wbc, no rbc's,  Urine PCR = 9.0 mg/mg  Assessment: 1.  CKD III d/t diab nephropathy w neph syndrome, superimposed preeclampsia and acute on CRF  2.  HTN - under control w po meds under control w po medication 3.  Type 1 DM 4.  IUP 27 wks 5D  5.  Edema/ vol overload - diffuse edema due to Na retention/ CKD and hypoalbuminemia. After delivery would stop IVF's.   Plan - will follow   Kelly Splinter MD [facility] pager [phone]    cell [phone] 10/19/2015, 3:06 PM    Recent Labs Lab 10/18/15 1856 10/19/15 0246 10/19/15 1042  NA 134* 135 134*  K 4.5 4.5 4.4  CL 110 110 109  CO2 19* 20*  17*  GLUCOSE 123* 59* 124*  BUN 28* 28* 26*  CREATININE 1.93* 1.87* 1.64*  CALCIUM 8.1* 8.0* 7.6*    Recent Labs Lab 10/18/15 1856 10/19/15 0246 10/19/15 1042  AST _1 ALT _2 ALKPHOS 49 48 41  BILITOT 0.3 0.4 0.3  PROT 4.9* 5.2* 4.5*  ALBUMIN 1.4* 1.4* 1.3*    Recent Labs Lab 10/18/15 1856 10/19/15 0246 10/19/15 1042  WBC 16.6* 17.6* 17.6*  HGB 7.7* 8.0* 7.7*  HCT 21.4* 22.0* 21.2*  MCV 86.6 83.3 84.5  PLT 221 247 245   Iron/TIBC/Ferritin/ %Sat    Component Value Date/Time   IRON 68 07/22/2015 1051   TIBC 309 07/22/2015 1051   FERRITIN 56 07/22/2015 1051   IRONPCTSAT 22 07/22/2015 1051

## 2015-10-20 LAB — COMPREHENSIVE METABOLIC PANEL
ALT: 28 U/L (ref 14–54)
ANION GAP: 6 (ref 5–15)
AST: 27 U/L (ref 15–41)
Albumin: 1.2 g/dL — ABNORMAL LOW (ref 3.5–5.0)
Alkaline Phosphatase: 42 U/L (ref 38–126)
BUN: 28 mg/dL — AB (ref 6–20)
CHLORIDE: 109 mmol/L (ref 101–111)
CO2: 18 mmol/L — ABNORMAL LOW (ref 22–32)
Calcium: 7.3 mg/dL — ABNORMAL LOW (ref 8.9–10.3)
Creatinine, Ser: 1.99 mg/dL — ABNORMAL HIGH (ref 0.44–1.00)
GFR calc Af Amer: 38 mL/min — ABNORMAL LOW (ref >60)
GFR, EST NON AFRICAN AMERICAN: 33 mL/min — AB (ref >60)
Glucose, Bld: 80 mg/dL (ref 65–99)
POTASSIUM: 4.2 mmol/L (ref 3.5–5.1)
Sodium: 133 mmol/L — ABNORMAL LOW (ref 135–145)
TOTAL PROTEIN: 4 g/dL — AB (ref 6.5–8.1)
Total Bilirubin: 0.3 mg/dL (ref 0.3–1.2)

## 2015-10-20 LAB — MAGNESIUM
MAGNESIUM: 5.2 mg/dL — AB (ref 1.7–2.4)
MAGNESIUM: 5.6 mg/dL — AB (ref 1.7–2.4)
MAGNESIUM: 5.8 mg/dL — AB (ref 1.7–2.4)
MAGNESIUM: 6.6 mg/dL — AB (ref 1.7–2.4)

## 2015-10-20 LAB — GLUCOSE, CAPILLARY
GLUCOSE-CAPILLARY: 130 mg/dL — AB (ref 65–99)
GLUCOSE-CAPILLARY: 130 mg/dL — AB (ref 65–99)
GLUCOSE-CAPILLARY: 163 mg/dL — AB (ref 65–99)
GLUCOSE-CAPILLARY: 221 mg/dL — AB (ref 65–99)
GLUCOSE-CAPILLARY: 67 mg/dL (ref 65–99)
GLUCOSE-CAPILLARY: 77 mg/dL (ref 65–99)
GLUCOSE-CAPILLARY: 97 mg/dL (ref 65–99)
Glucose-Capillary: 87 mg/dL (ref 65–99)
Glucose-Capillary: 74 mg/dL (ref 65–99)
Glucose-Capillary: 68 mg/dL (ref 65–99)
Glucose-Capillary: 70 mg/dL (ref 65–99)

## 2015-10-20 MED ORDER — OXYCODONE HCL 5 MG PO TABS
5.0000 mg | ORAL_TABLET | ORAL | Status: DC | PRN
  Administered 2015-10-20 – 2015-10-21 (×2): 5 mg via ORAL
  Filled 2015-10-20 (×4): qty 1

## 2015-10-20 MED ORDER — OXYCODONE HCL 5 MG PO TABS
10.0000 mg | ORAL_TABLET | ORAL | Status: DC | PRN
  Administered 2015-10-22 (×2): 10 mg via ORAL
  Filled 2015-10-20 (×2): qty 2

## 2015-10-20 MED ORDER — SODIUM CHLORIDE 0.9 % IV SOLN
INTRAVENOUS | Status: DC
  Administered 2015-10-20: 10:00:00 via INTRAVENOUS

## 2015-10-20 MED ORDER — INSULIN DETEMIR 100 UNIT/ML ~~LOC~~ SOLN
4.0000 [IU] | Freq: Once | SUBCUTANEOUS | Status: AC
  Administered 2015-10-20: 4 [IU] via SUBCUTANEOUS
  Filled 2015-10-20: qty 0.04

## 2015-10-20 MED ORDER — SODIUM CHLORIDE 0.9 % IV SOLN
Freq: Once | INTRAVENOUS | Status: AC
  Administered 2015-10-20: 20:00:00 via INTRAVENOUS

## 2015-10-20 MED ORDER — LABETALOL HCL 200 MG PO TABS
200.0000 mg | ORAL_TABLET | Freq: Two times a day (BID) | ORAL | Status: DC
  Administered 2015-10-20 – 2015-10-22 (×4): 200 mg via ORAL
  Filled 2015-10-20 (×4): qty 1

## 2015-10-20 MED ORDER — SODIUM CHLORIDE 0.9% FLUSH
3.0000 mL | INTRAVENOUS | Status: DC | PRN
  Administered 2015-10-20: 3 mL via INTRAVENOUS
  Filled 2015-10-20: qty 3

## 2015-10-20 MED ORDER — FUROSEMIDE 10 MG/ML IJ SOLN
40.0000 mg | Freq: Once | INTRAMUSCULAR | Status: AC
  Administered 2015-10-20: 40 mg via INTRAVENOUS
  Filled 2015-10-20: qty 4

## 2015-10-20 NOTE — Progress Notes (Signed)
Subjective: Postpartum Day 1: Cesarean Delivery for Breech, Chronic HTN with SIPE, deteriorating renal function 1.4->1.9, Class R/F diabetes type I Patient reports incisional pain and tolerating PO.    Objective: Vital signs in last 24 hours: Temp:  [97.3 F (36.3 C)-98.8 F (37.1 C)] 98.6 F (37 C) (09/08 0648) Pulse Rate:  [79-102] 89 (09/08 0648) Resp:  [12-20] 18 (09/08 0648) BP: (117-154)/(73-96) 132/78 (09/08 0648) SpO2:  [99 %-100 %] 100 % (09/08 3419)  Physical Exam:  General: alert, cooperative, fatigued and no distress Lochia: appropriate Uterine Fundus: difficult to feel, small Incision: healing well, no significant drainage, minimal drainage on dressing DVT Evaluation: No evidence of DVT seen on physical exam. No cords or calf tenderness. Calf/Ankle edema is present.1+   Recent Labs  10/19/15 1042 10/20/15 0553  HGB 7.7* 6.6*  HCT 21.2* 17.6*    Assessment/Plan: Status post Cesarean section. Postoperative course complicated by anemia, Chronic,   Continue current care   1. Mag til 7 pm d/c ordered 2. Glucose stabilizer d/c'd CBG q 2h, will d.c glucose infusion as she's taking po. 3 consider transfusion 2 units  Braulio Kiedrowski V 10/20/2015, 8:07 AM

## 2015-10-20 NOTE — Progress Notes (Signed)
Hypoglycemic Event  CBG: 68 mg/dl  Treatment: 15 GM carbohydrate snack  Symptoms: None  Follow-up CBG: Time:0722 CBG Result:77mg /dl  Possible Reasons for Event: Inadequate meal intake  Comments/MD notified:0740    Carianna Lague, Christeen Douglas

## 2015-10-20 NOTE — Progress Notes (Signed)
Hgb 6.6 Dr Glo Herring on Unit and was made aware.

## 2015-10-20 NOTE — Progress Notes (Signed)
I visited with Shekera and her mother and FOB as they were leaving NICU.  She shared that she is feeling a bit better after seeing Logan, but experiencing some pain.  She was in good spirits, but appeared tired.  I normalized feelings of divided attention for grandmother and father.    Please page as further needs arise.  Donald Prose. Elyn Peers, M.Div. [facility] Chaplain Pager [phone] Office [phone]

## 2015-10-20 NOTE — Progress Notes (Signed)
CRITICAL VALUE ALERT  Critical value received:  Magnesium 6.6  Date of notification:  10/20/2015  Time of notification:  1905  Critical value read back:Yes.    Nurse who received alert:  Carey Bullocks   MD notified (1st page):  ferguson  Time of first page:  1907  MD notified (2nd page):  Time of second page:  Responding MD:  Glo Herring  Time MD responded:  Phone call answered at [place]  No new orders received.

## 2015-10-20 NOTE — Lactation Note (Signed)
This note was copied from a baby's chart. Lactation Consultation Note  Patient Name: Christine Reed NTIRW'E Date: 10/20/2015 Reason for consult: Initial assessment   Attempted initial consult on first time mom of 11 hour old NICU infant born at [place]. Mom om MgSo4 and is a Type 1 Diabetic.   Mom was very drowsy. She roused to say she wanted to pump and by the time the pump was set up in room she was snoring. Will follow up later today to begin pumping.   Bf Resources Handout, Providing Milk for Your Baby in NICU Booklet, colostrum collection containers, Hand Expression Handout, and # stickers left at bedside.    Maternal Data Formula Feeding for Exclusion: Yes Reason for exclusion: Mother's choice to formula and breast feed on admission Has patient been taught Hand Expression?: No Does the patient have breastfeeding experience prior to this delivery?: No  Feeding    LATCH Score/Interventions                      Lactation Tools Discussed/Used     Consult Status Consult Status: Follow-up Date: 10/20/15 Follow-up type: In-patient    [name] 10/20/2015, 8:38 AM

## 2015-10-20 NOTE — Progress Notes (Signed)
Pt was very tired after returning from NICU this afternoon at around 1400. I assessed her then and gave heat packs. She states that she really wants to rest at this time. I have been rounding on pt and her mother hourly since then and will continue throughout my shift. Pt was not awakened during this time period as she has requested and needs rest. Araceli Bouche, Glynis Smiles

## 2015-10-20 NOTE — Progress Notes (Signed)
Inpatient Diabetes Program Recommendations  AACE/ADA: New Consensus Statement on Inpatient Glycemic Control (2015)  Target Ranges:  Prepandial:   less than 140 mg/dL      Peak postprandial:   less than 180 mg/dL (1-2 hours)      Critically ill patients:  140 - 180 mg/dL   Lab Results  Component Value Date   GLUCAP 77 10/20/2015   HGBA1C 6.2 09/29/2015    Review of Glycemic Control  Diabetes history: DM 1 Outpatient Diabetes medications: Levemir and Humalog (Sees Dr. Dwyane Dee outpatient for DM) Current orders for Inpatient glycemic control: Levemir 4 units BID, Novolog Moderate scale (0-15 units) and Novolog HS scale  Inpatient Diabetes Program Recommendations:  Correction (SSI): With patient's renal function and hystory of DM type 1, Please reduce correction scale to Novolog Sensitive TID and add Novolog 3 units meal coverage TID (patient to eat at least 50% of meals for meal coverage).  Thanks,  Tama Headings RN, MSN, [facility] Inpatient Diabetes Coordinator Team Pager [phone] (8a-5p)

## 2015-10-20 NOTE — Anesthesia Postprocedure Evaluation (Signed)
Anesthesia Post Note  Patient: Christine Reed  Procedure(s) Performed: * No procedures listed *  Patient location during evaluation: [facility] Unit Anesthesia Type: Spinal Level of consciousness: awake and alert, oriented and patient cooperative Pain management: pain level controlled Vital Signs Assessment: post-procedure vital signs reviewed and stable Respiratory status: spontaneous breathing Cardiovascular status: stable Postop Assessment: no headache, patient able to bend at knees, no signs of nausea or vomiting and spinal receding Anesthetic complications: no Comments: Pt states pain score of 1.     Last Vitals:  Vitals:   10/20/15 0505 10/20/15 0648  BP: 121/84 132/78  Pulse: 86 89  Resp: 17 18  Temp: 36.9 C 37 C    Last Pain:  Vitals:   10/20/15 0648  TempSrc: Oral  PainSc:    Pain Goal: Patients Stated Pain Goal: 3 (10/18/15 0810)               Rico Sheehan

## 2015-10-20 NOTE — Plan of Care (Signed)
Problem: Activity: Goal: Risk for activity intolerance will decrease Outcome: Not Progressing Pt is still sleeping well in between assessments, still on Mg.   Problem: Bowel/Gastric: Goal: Will not experience complications related to bowel motility Outcome: Progressing Passing gas without complications, no BM since delivery.

## 2015-10-20 NOTE — Progress Notes (Addendum)
   [facility] Progress Note   Subjective: Christine Reed delivered yest, looks better today, resting . Creat is 1.99 today    Vitals:   10/20/15 1900 10/20/15 2015 10/20/15 2030 10/20/15 2228  BP:  134/75 139/79 (!) 151/97  Pulse:  94 93 91  Resp: '16 18 16 15  '$ Temp:  99 F (37.2 C) 98.9 F (37.2 C) 99 F (37.2 C)  TempSrc:  Oral Oral Oral  SpO2:  100% 100% 100%  Weight:      Height:        Inpatient medications: . insulin aspart  0-15 Units Subcutaneous TID WC  . insulin aspart  0-5 Units Subcutaneous QHS  . insulin detemir  4 Units Subcutaneous BID  . labetalol  200 mg Oral BID  . prenatal multivitamin  1 tablet Oral Q1200  . scopolamine  1 patch Transdermal Once  . senna-docusate  2 tablet Oral Q24H  . simethicone  80 mg Oral TID PC  . simethicone  80 mg Oral Q24H  . Tdap  0.5 mL Intramuscular Once   . sodium chloride 50 mL/hr at 10/20/15 1014  . insulin (NOVOLIN-R) infusion Stopped (10/20/15 0541)  . lactated ringers Stopped (10/19/15 2038)  . naLOXone [facility]) adult infusion for PRURITIS     acetaminophen, coconut oil, witch hazel-glycerin **AND** dibucaine, diphenhydrAMINE **OR** diphenhydrAMINE, diphenhydrAMINE, menthol-cetylpyridinium, nalbuphine **OR** nalbuphine, nalbuphine **OR** nalbuphine, naLOXone (NARCAN) adult infusion for PRURITIS, naloxone **AND** sodium chloride flush, ondansetron (ZOFRAN) IV, oxyCODONE **OR** oxyCODONE, simethicone, sodium chloride flush, zolpidem  Exam: Gen alert, no distress, facial edema resolved No rash, cyanosis or gangrene Sclera anicteric, throat clear  No jvd or bruits Chest clear bilat to the bases, no rales or wheezing RRR no MRG Abd soft ntnd GU foley w clear urine Ext 1-2+edema   Na 132 K 4.7  Cr 1.94 eGFR 39   Hb 8  plt 196 LFT's ok  Alb 1.4   Tprot 4.6 UA - >300 prot, 6-30 wbc, no rbc's,  Urine PCR = 9.0 mg/mg  Assessment: 1.  CKD 3 w superimposed preeclampsia - sp delivery, POD #1 2.  HTN - under control  w po meda 3.  Type 1 DM 4.  Edema/ vol overload - looks better on exam 5.  S/P C-sect  Plan - will follow   Kelly Splinter MD [facility] pager [phone]    cell [phone] 10/20/2015, 11:01 PM    Recent Labs Lab 10/19/15 0246 10/19/15 1042 10/20/15 0553  NA 135 134* 133*  K 4.5 4.4 4.2  CL 110 109 109  CO2 20* 17* 18*  GLUCOSE 59* 124* 80  BUN 28* 26* 28*  CREATININE 1.87* 1.64* 1.99*  CALCIUM 8.0* 7.6* 7.3*    Recent Labs Lab 10/19/15 0246 10/19/15 1042 10/20/15 0553  AST '23 21 27  '$ ALT '24 23 28  '$ ALKPHOS 48 41 42  BILITOT 0.4 0.3 0.3  PROT 5.2* 4.5* 4.0*  ALBUMIN 1.4* 1.3* 1.2*    Recent Labs Lab 10/19/15 0246 10/19/15 1042 10/20/15 0553  WBC 17.6* 17.6* 20.8*  HGB 8.0* 7.7* 6.6*  HCT 22.0* 21.2* 17.6*  MCV 83.3 84.5 85.0  PLT 247 245 220   Iron/TIBC/Ferritin/ %Sat    Component Value Date/Time   IRON 68 07/22/2015 1051   TIBC 309 07/22/2015 1051   FERRITIN 56 07/22/2015 1051   IRONPCTSAT 22 07/22/2015 1051

## 2015-10-21 LAB — CBC
HCT: 24.9 % — ABNORMAL LOW (ref 36.0–46.0)
HEMOGLOBIN: 8.8 g/dL — AB (ref 12.0–15.0)
MCH: 29.9 pg (ref 26.0–34.0)
MCHC: 35.3 g/dL (ref 30.0–36.0)
MCV: 84.7 fL (ref 78.0–100.0)
PLATELETS: 209 10*3/uL (ref 150–400)
RBC: 2.94 MIL/uL — AB (ref 3.87–5.11)
RDW: 14.5 % (ref 11.5–15.5)
WBC: 19.5 10*3/uL — AB (ref 4.0–10.5)

## 2015-10-21 LAB — TYPE AND SCREEN
ABO/RH(D): O POS
Antibody Screen: NEGATIVE
UNIT DIVISION: 0
Unit division: 0

## 2015-10-21 LAB — BASIC METABOLIC PANEL
ANION GAP: 4 — AB (ref 5–15)
BUN: 30 mg/dL — ABNORMAL HIGH (ref 6–20)
CO2: 22 mmol/L (ref 22–32)
Calcium: 7.2 mg/dL — ABNORMAL LOW (ref 8.9–10.3)
Chloride: 109 mmol/L (ref 101–111)
Creatinine, Ser: 1.96 mg/dL — ABNORMAL HIGH (ref 0.44–1.00)
GFR calc Af Amer: 39 mL/min — ABNORMAL LOW (ref >60)
GFR, EST NON AFRICAN AMERICAN: 34 mL/min — AB (ref >60)
GLUCOSE: 121 mg/dL — AB (ref 65–99)
POTASSIUM: 4.3 mmol/L (ref 3.5–5.1)
SODIUM: 135 mmol/L (ref 135–145)

## 2015-10-21 LAB — GLUCOSE, CAPILLARY
GLUCOSE-CAPILLARY: 93 mg/dL (ref 65–99)
GLUCOSE-CAPILLARY: 122 mg/dL — AB (ref 65–99)
GLUCOSE-CAPILLARY: 87 mg/dL (ref 65–99)
Glucose-Capillary: 61 mg/dL — ABNORMAL LOW (ref 65–99)

## 2015-10-21 LAB — RPR: RPR Ser Ql: NONREACTIVE

## 2015-10-21 MED ORDER — FUROSEMIDE 10 MG/ML IJ SOLN
40.0000 mg | Freq: Once | INTRAMUSCULAR | Status: AC
  Administered 2015-10-21: 40 mg via INTRAVENOUS
  Filled 2015-10-21: qty 4

## 2015-10-21 NOTE — Progress Notes (Signed)
  [place] [facility] Progress Note   Subjective: No c/o's . Creat is 1.96, unchanged.  5 L uop yesterday w lasix 40 mg x 1  Vitals:   10/20/15 2326 10/21/15 0127 10/21/15 0605 10/21/15 1058  BP: (!) 153/97 (!) 156/85 (!) 143/84 127/79  Pulse: 96 89 (!) 104 93  Resp: _0 Temp: 98.9 F (37.2 C) 99 F (37.2 C) 99 F (37.2 C) 99.2 F (37.3 C)  TempSrc: Oral Oral Oral Oral  SpO2: 100% 100% 98% 97%  Weight:   85.7 kg (189 lb)   Height:        Inpatient medications: . insulin aspart  0-15 Units Subcutaneous TID WC  . insulin aspart  0-5 Units Subcutaneous QHS  . insulin detemir  4 Units Subcutaneous BID  . labetalol  200 mg Oral BID  . prenatal multivitamin  1 tablet Oral Q1200  . scopolamine  1 patch Transdermal Once  . senna-docusate  2 tablet Oral Q24H  . simethicone  80 mg Oral TID PC  . simethicone  80 mg Oral Q24H  . Tdap  0.5 mL Intramuscular Once   . sodium chloride 50 mL/hr at 10/20/15 1014  . insulin (NOVOLIN-R) infusion Stopped (10/20/15 0541)  . lactated ringers Stopped (10/19/15 2038)  . naLOXone [facility]) adult infusion for PRURITIS     acetaminophen, coconut oil, witch hazel-glycerin **AND** dibucaine, diphenhydrAMINE **OR** diphenhydrAMINE, diphenhydrAMINE, menthol-cetylpyridinium, nalbuphine **OR** nalbuphine, nalbuphine **OR** nalbuphine, naLOXone (NARCAN) adult infusion for PRURITIS, naloxone **AND** sodium chloride flush, ondansetron (ZOFRAN) IV, oxyCODONE **OR** oxyCODONE, simethicone, sodium chloride flush, zolpidem  Exam: Gen alert, no distress, facial edema a little better No rash, cyanosis or gangrene Sclera anicteric, throat clear  No jvd or bruits Chest clear bilat to the bases, no rales or wheezing RRR no MRG Abd soft ntnd Ext 1-2+edema UE's/ LE's   Na 132 K 4.7  Cr 1.94 eGFR 39   Hb 8  plt 196 LFT's ok  Alb 1.4   Tprot 4.6 UA - >300 prot, 6-30 wbc, no rbc's,  Urine PCR = 9.0 mg/mg  Assessment: 1.  CKD 3 w superimposed  preeclampsia - sp C-section, POD #2 2.  HTN - under control w po meds 3.  Type 1 DM 4.  Edema/ vol overload - still swollen  Plan - repeat 40 mg IV Lasix x 1, dc'd IVF's   Kelly Splinter MD [facility] pager [phone]    cell [phone] 10/21/2015, 2:59 PM    Recent Labs Lab 10/19/15 1042 10/20/15 0553 10/21/15 0748  NA 134* 133* 135  K 4.4 4.2 4.3  CL 109 109 109  CO2 17* 18* 22  GLUCOSE 124* 80 121*  BUN 26* 28* 30*  CREATININE 1.64* 1.99* 1.96*  CALCIUM 7.6* 7.3* 7.2*    Recent Labs Lab 10/19/15 0246 10/19/15 1042 10/20/15 0553  AST _1 ALT _2 ALKPHOS 48 41 42  BILITOT 0.4 0.3 0.3  PROT 5.2* 4.5* 4.0*  ALBUMIN 1.4* 1.3* 1.2*    Recent Labs Lab 10/19/15 1042 10/20/15 0553 10/21/15 0520  WBC 17.6* 20.8* 19.5*  HGB 7.7* 6.6* 8.8*  HCT 21.2* 17.6* 24.9*  MCV 84.5 85.0 84.7  PLT 245 220 209   Iron/TIBC/Ferritin/ %Sat    Component Value Date/Time   IRON 68 07/22/2015 1051   TIBC 309 07/22/2015 1051   FERRITIN 56 07/22/2015 1051   IRONPCTSAT 22 07/22/2015 1051

## 2015-10-21 NOTE — Progress Notes (Signed)
Subjective: Postpartum Day 2: Cesarean Delivery PT PREFERENCE,  Chtn, superimposed preeclampsia, ClassF DM Anemia at 27 wk5 d Patient reports tolerating PO, + flatus and no problems voiding.   Pt on Levimir 4 u/bid, and Novolog q meal,  Pt received 2 units prbc w/o difficulty Objective: Vital signs in last 24 hours: Temp:  [98.6 F (37 C)-99 F (37.2 C)] 99 F (37.2 C) (09/09 0605) Pulse Rate:  [89-104] 104 (09/09 0605) Resp:  [12-18] 18 (09/09 0605) BP: (125-156)/(75-97) 143/84 (09/09 0605) SpO2:  [98 %-100 %] 98 % (09/09 0605) Weight:  [189 lb (85.7 kg)-191 lb (86.6 kg)] 189 lb (85.7 kg) (09/09 7902)  Intake/Output Summary (Last 24 hours) at 10/21/15 4097 Last data filed at 10/21/15 0616  Gross per 24 hour  Intake          2259.91 ml  Output             5312 ml  Net         -3052.09 ml    Physical Exam:  General: alert, cooperative and appears older than stated age 82: appropriate Uterine Fundus: firm Incision: healing well, no significant drainage, no dehiscence, no significant erythema DVT Evaluation: No evidence of DVT seen on physical exam.  todays CBC post transfusion pending CBC Latest Ref Rng & Units 10/20/2015 10/19/2015 10/19/2015  WBC 4.0 - 10.5 K/uL 20.8(H) 17.6(H) 17.6(H)  Hemoglobin 12.0 - 15.0 g/dL 6.6(LL) 7.7(L) 8.0(L)  Hematocrit 36.0 - 46.0 % 17.6(L) 21.2(L) 22.0(L)  Platelets 150 - 400 K/uL 220 245 247   CBG (last 3)   Recent Labs  10/20/15 1746 10/20/15 2208 10/21/15 0231  GLUCAP 70 67 93      Assessment/Plan: Status post Cesarean section. Postoperative course complicated by anemia, transfusion 2 units, CBC pending, Renal function stable.  Diabetes on levimir 4 u bid and novolog q meal, breast fdg. Continue current care. Probable d/c Sunday Chadd Tollison V 10/21/2015, 6:30 AM

## 2015-10-21 NOTE — Plan of Care (Signed)
Problem: Life Cycle: Goal: Risk for postpartum hemorrhage will decrease Outcome: Completed/Met Date Met: 11-12-2015 Vaginal bleeding minimal. Goal: Chance of risk for complications during the postpartum period will decrease Outcome: Progressing Magnesium drip discontinued and patient started on po blood pressure medication.  Problem: Nutritional: Goal: Dietary intake will improve Outcome: Completed/Met Date Met: 2015-11-12 Tolerates a Renal Modified Carb diet well.  Problem: Role Relationship: Goal: Ability to demonstrate positive interaction with newborn will improve Outcome: Completed/Met Date Met: 2015/11/12 Infant is in NICU but patient goes down to visit.  Problem: Bowel/Gastric: Goal: Gastrointestinal status will improve Outcome: Completed/Met Date Met: 2015-11-12 Has passed flatus.  Problem: Respiratory: Goal: Ability to maintain adequate ventilation will improve Outcome: Completed/Met Date Met: Nov 12, 2015 Sat's 100% room air.No SOB or DOE with activity.

## 2015-10-22 LAB — BASIC METABOLIC PANEL
Anion gap: 4 — ABNORMAL LOW (ref 5–15)
BUN: 27 mg/dL — AB (ref 6–20)
CHLORIDE: 108 mmol/L (ref 101–111)
CO2: 23 mmol/L (ref 22–32)
CREATININE: 1.81 mg/dL — AB (ref 0.44–1.00)
Calcium: 7.5 mg/dL — ABNORMAL LOW (ref 8.9–10.3)
GFR calc Af Amer: 43 mL/min — ABNORMAL LOW (ref >60)
GFR calc non Af Amer: 37 mL/min — ABNORMAL LOW (ref >60)
GLUCOSE: 107 mg/dL — AB (ref 65–99)
POTASSIUM: 4 mmol/L (ref 3.5–5.1)
Sodium: 135 mmol/L (ref 135–145)

## 2015-10-22 LAB — GLUCOSE, CAPILLARY
GLUCOSE-CAPILLARY: 101 mg/dL — AB (ref 65–99)
Glucose-Capillary: 129 mg/dL — ABNORMAL HIGH (ref 65–99)

## 2015-10-22 MED ORDER — OXYCODONE HCL 5 MG PO TABS: 5.0000 mg | ORAL_TABLET | ORAL | 0 refills | Status: DC | PRN

## 2015-10-22 MED ORDER — INSULIN DETEMIR 100 UNIT/ML FLEXPEN: PEN_INJECTOR | SUBCUTANEOUS | 4 refills | Status: DC

## 2015-10-22 MED ORDER — INSULIN ASPART 100 UNIT/ML ~~LOC~~ SOLN: 0.0000 [IU] | Freq: Three times a day (TID) | SUBCUTANEOUS | 11 refills | Status: DC

## 2015-10-22 MED ORDER — ENOXAPARIN SODIUM 40 MG/0.4ML ~~LOC~~ SOLN
40.0000 mg | SUBCUTANEOUS | Status: DC
  Administered 2015-10-22: 40 mg via SUBCUTANEOUS
  Filled 2015-10-22 (×2): qty 0.4

## 2015-10-22 MED ORDER — LABETALOL HCL 200 MG PO TABS: 200.0000 mg | ORAL_TABLET | Freq: Two times a day (BID) | ORAL | 2 refills | Status: DC

## 2015-10-22 NOTE — Discharge Instructions (Signed)
Cesarean Delivery, Care After Refer to this sheet in the next few weeks. These instructions provide you with information on caring for yourself after your procedure. Your health care provider may also give you specific instructions. Your treatment has been planned according to current medical practices, but problems sometimes occur. Call your health care provider if you have any problems or questions after you go home. HOME CARE INSTRUCTIONS  If you have an On-Q pump, remove it on the 5th day after your surgery, by removing the dressing/bandage and pulling the pump out. Cover the site where the pump strings came out with a band-aid, as needed.  Only take over-the-counter or prescription medications as directed by your health care provider.  Do not drink alcohol, especially if you are breastfeeding or taking medication to relieve pain.  Do not  smoke tobacco.  Continue to use good perineal care. Good perineal care includes:  Wiping your perineum from front to back.  Keeping your perineum clean.  Check your surgical cut (incision) daily for increased redness, drainage, swelling, or separation of skin.  Shower and clean your incision gently with soap and water every day, by letting warm and soapy water run over the incision, and then pat it dry. If your health care provider says it is okay, leave the incision uncovered. Use a bandage (dressing) if the incision is draining fluid or appears irritated. If the adhesive strips across the incision do not fall off within 7 days, carefully peel them off, after a shower.  Hug a pillow when coughing or sneezing until your incision is healed. This helps to relieve pain.  Do not use tampons, douches or have sexual intercourse, until your health care provider says it is okay.  Wear a well-fitting bra that provides breast support.  Limit wearing support panties or control-top hose.  Drink enough fluids to keep your urine clear or pale  yellow.  Eat high-fiber foods such as whole grain cereals and breads, brown rice, beans, and fresh fruits and vegetables every day. These foods may help prevent or relieve constipation.  Resume activities such as climbing stairs, driving, lifting, exercising, or traveling as directed by your health care provider.  Try to have someone help you with your household activities and your newborn for at least a few days after you leave the hospital.  Rest as much as possible. Try to rest or take a nap when your newborn is sleeping.  Increase your activities gradually.  Do not lift more than 15lbs until directed by a provider.  Keep all of your scheduled postpartum appointments. It is very important to keep your scheduled follow-up appointments. At these appointments, your health care provider will be checking to make sure that you are healing physically and emotionally. SEEK MEDICAL CARE IF:   You are passing large clots from your vagina. Save any clots to show your health care provider.  You have a foul smelling discharge from your vagina.  You have trouble urinating.  You are urinating frequently.  You have pain when you urinate.  You have a change in your bowel movements.  You have increasing redness, pain, or swelling near your incision.  You have pus draining from your incision.  Your incision is separating.  You have painful, hard, or reddened breasts.  You have a severe headache.  You have blurred vision or see spots.  You feel sad or depressed.  You have thoughts of hurting yourself or your newborn.  You have questions about your  care, the care of your newborn, or medications.  You are dizzy or light-headed.  You have a rash.  You have pain, redness, or swelling at the site of the removed intravenous access (IV) tube.  You have nausea or vomiting.  You stopped breastfeeding and have not had a menstrual period within 12 weeks of stopping.  You are not  breastfeeding and have not had a menstrual period within 12 weeks of delivery.  You have a fever. SEEK IMMEDIATE MEDICAL CARE IF:  You have persistent pain.  You have chest pain.  You have shortness of breath.  You faint.  You have leg pain.  You have stomach pain.  Your vaginal bleeding saturates 2 or more sanitary pads in 1 hour. MAKE SURE YOU:   Understand these instructions.  Will watch your condition.  Will get help right away if you are not doing well or get worse. Document Released: 10/20/2001 Document Revised: 06/14/2013 Document Reviewed: 09/25/2011 [facility] Patient Information 2015 [place], [place]. This information is not intended to replace advice given to you by your health care provider. Make sure you discuss any questions you have with your health care provider.

## 2015-10-22 NOTE — Discharge Summary (Signed)
Obstetrical Discharge Summary  Date of Admission: 10/17/2015 Date of Discharge: 10/22/2015  Primary OB: [facility]  Gestational Age at Delivery: [redacted]w[redacted]d   Antepartum complications: poorly controlled DM1, chronic kidney disease, nephropathy, chronic hypertension, chronic anemia Reason for Admission: pre-eclampsia with severe features Date of Delivery: 10/19/2015  Delivered By: Dr. Arlina Robes, MD Delivery Type: primary cesarean section, low transverse incision Hospital complications/course: Patient was admitted from clinic and given the degree of her kidney dysfunction, in consultation with MFM, the decision was made to move towards delivery. Prior to this, she was placed on proacardia XL30mg  bid, to control her blood pressures, given a course of betamethasone (2nd dose given 12 hours early, given the degree of her pre-eclampsia), started on magnesium for maternal and fetal benefit and placed on an insulin gtt; endocrine and nephrology were also consulted. She underwent an uncomplicated c-section and received 24 hours of magnesium postpartum. Anesthesia: spinal Placenta: Delivered and expressed via active management. Intact: yes. To pathology: yes.  Laceration: n/a Episiotomy: none EBL: 625mL Baby: Liveborn female, APGARs 3/6/7 , weight 830 g.    Discharge Diagnosis: Delivered. Improving creatinine and anemia  Postpartum course:  *Postpartum/postop: routine care. Pt meeting all PP goals. Contraception briefly d/w pt but can follow up at [facility] visit. She is O POS *Renal: improving. Pt told to call [facility] sometime in the next 1-2wks for PP follow up. She was approximately 4L negative on her I/O on discharge. CMP Latest Ref Rng & Units 10/22/2015 10/21/2015 10/20/2015  Glucose 65 - 99 mg/dL 107(H) 121(H) 80  BUN 6 - 20 mg/dL 27(H) 30(H) 28(H)  Creatinine 0.44 - 1.00 mg/dL 1.81(H) 1.96(H) 1.99(H)  Sodium 135 - 145 mmol/L 135 135 133(L)  Potassium 3.5 - 5.1  mmol/L 4.0 4.3 4.2  Chloride 101 - 111 mmol/L 108 109 109  CO2 22 - 32 mmol/L 23 22 18(L)  Calcium 8.9 - 10.3 mg/dL 7.5(L) 7.2(L) 7.3(L)  Total Protein 6.5 - 8.1 g/dL - - 4.0(L)  Total Bilirubin 0.3 - 1.2 mg/dL - - 0.3  Alkaline Phos 38 - 126 U/L - - 42  AST 15 - 41 U/L - - 27  ALT 14 - 54 U/L - - 28   *cHTN with superimposed severe pre-x: continue current regimen until can be seen by nephrology.  She received 24hrs of postpartum Magnesium. *PPx: lovenox 40mg  qday x 2 weeks due to her degree of nephropathy. Antiphospholipid Ab syndrome panel ordered per MFM recs and if + then will do 60wsk and not two weeks of lovenox. Will call pt with panel since it takes time for it to come back *DM1: well controlled on current regimen *Anemia: she received 2 units PRBCs and was asymptomatic on discharge CBC Latest Ref Rng & Units 10/21/2015 10/20/2015 10/19/2015  WBC 4.0 - 10.5 K/uL 19.5(H) 20.8(H) 17.6(H)  Hemoglobin 12.0 - 15.0 g/dL 8.8(L) 6.6(LL) 7.7(L)  Hematocrit 36.0 - 46.0 % 24.9(L) 17.6(L) 21.2(L)  Platelets 150 - 400 K/uL 209 220 245   Discharge Vital Signs:  Current Vital Signs 24h Vital Sign Ranges  T 99.1 F (37.3 C) Temp  Avg: 99.3 F (37.4 C)  Min: 99.1 F (37.3 C)  Max: 99.7 F (37.6 C)  BP 138/83 BP  Min: 127/79  Max: 143/79  HR 84 Pulse  Avg: 86.8  Min: 84  Max: 93  RR 20 Resp  Avg: 18.8  Min: 18  Max: 20  SaO2 98 % Not Delivered SpO2  Avg: 98.6 %  Min: 97 %  Max: 100 %       24 Hour I/O Current Shift I/O  Time Ins Outs 09/09 0701 - 09/10 0700 In: 1560 [P.O.:1560] Out: 3400 [Urine:3400] No intake/output data recorded.   Discharge Exam:  General: NAD Abdomen: +BS, soft, nttp, nd. C/d/i  Perineum: deferred Skin:  Warm and dry.  Cardiovascular: S1, S2 normal, no murmur, rub or gallop, regular rate and rhythm Respiratory:  Clear to auscultation bilateral. Normal respiratory effort Extremities: no c/c/e  Disposition: Home  Rh Immune globulin given: not applicable Rubella  vaccine given: not applicable Tdap vaccine given in AP or PP setting: ordered Flu vaccine given in AP or PP setting: not applicable  Contraception: discussed patient and will follow up at [facility] visit  Plan:  Christine Reed was discharged to home in good condition. Follow-up appointment with Social Work an Therapist, sports visit in 7-10 days for a mood and BP visit  Discharge Medications:   Medication List    STOP taking these medications   aspirin 81 MG chewable tablet   HUMALOG KWIKPEN 100 UNIT/ML KiwkPen Generic drug:  insulin lispro     TAKE these medications   acetaminophen 500 MG tablet Commonly known as:  TYLENOL Take 500 mg by mouth every 6 (six) hours as needed for mild pain or headache.   diphenhydrAMINE 25 MG tablet Commonly known as:  BENADRYL Take 25 mg by mouth every 6 (six) hours as needed for itching or allergies.   enoxaparin 40 MG/0.4ML injection Commonly known as:  LOVENOX Inject 40 mg into the skin at bedtime.   famotidine 20 MG tablet Commonly known as:  PEPCID Take 1 tablet (20 mg total) by mouth 2 (two) times daily.   insulin aspart 100 UNIT/ML injection Commonly known as:  novoLOG Inject 0-15 Units into the skin 3 (three) times daily with meals. Based on carb counting 10:1 sliding scale   Insulin Detemir 100 UNIT/ML Pen Commonly known as:  LEVEMIR Inject 4 units twice daily. What changed:  additional instructions   labetalol 200 MG tablet Commonly known as:  NORMODYNE Take 1 tablet (200 mg total) by mouth 2 (two) times daily.   multivitamin-prenatal 27-0.8 MG Tabs tablet Take 1 tablet by mouth daily.   oxyCODONE 5 MG immediate release tablet Commonly known as:  Oxy IR/ROXICODONE Take 1-2 tablets (5-10 mg total) by mouth every 4 (four) hours as needed for severe pain.       Durene Romans MD Attending [facility])

## 2015-10-22 NOTE — Progress Notes (Signed)
Daily Post Partum Note  Christine Reed is a 30 y.o. G1P0101 POD#3 s/p pLTCS @ [redacted]w[redacted]d for severe pre-x  Pregnancy c/b CKD, DM1, nephropathy, cHTN 24hr/overnight events:  none  Subjective:  No s/s of pre-x, pain controlled with PO meds, minimal lochia, +flatus.   Objective:    Current Vital Signs 24h Vital Sign Ranges  T 99.1 F (37.3 C) Temp  Avg: 99.3 F (37.4 C)  Min: 99.1 F (37.3 C)  Max: 99.7 F (37.6 C)  BP 138/83 BP  Min: 127/79  Max: 143/79  HR 84 Pulse  Avg: 86.8  Min: 84  Max: 93  RR 20 Resp  Avg: 18.8  Min: 18  Max: 20  SaO2 98 % Not Delivered SpO2  Avg: 98.6 %  Min: 97 %  Max: 100 %       24 Hour I/O Current Shift I/O  Time Ins Outs 09/09 0701 - 09/10 0700 In: 1560 [P.O.:1560] Out: 3400 [Urine:3400] No intake/output data recorded.   Patient is 3766mL negative since admit.   General: NAD Abdomen: +BS, soft, nttp, nd. C/d/i  Perineum: deferred Skin:  Warm and dry.  Cardiovascular: S1, S2 normal, no murmur, rub or gallop, regular rate and rhythm Respiratory:  Clear to auscultation bilateral. Normal respiratory effort Extremities: no c/c/e  Medications Current Facility-Administered Medications  Medication Dose Route Frequency Provider Last Rate Last Dose  . acetaminophen (TYLENOL) tablet 650 mg  650 mg Oral Q4H PRN [facility], DO   650 mg at 10/21/15 1406  . coconut oil  1 application Topical PRN Lauralyn Primes [place], DO      . witch hazel-glycerin (TUCKS) pad 1 application  1 application Topical PRN [facility], DO       And  . dibucaine (NUPERCAINAL) 1 % rectal ointment 1 application  1 application Rectal PRN [facility] [place], DO      . diphenhydrAMINE (BENADRYL) injection 12.5 mg  12.5 mg Intravenous Q4H PRN Lyn Hollingshead, MD       Or  . diphenhydrAMINE (BENADRYL) capsule 25 mg  25 mg Oral Q4H PRN Lyn Hollingshead, MD      . diphenhydrAMINE (BENADRYL) capsule 25 mg  25 mg Oral Q6H PRN [facility], DO    25 mg at 10/20/15 2015  . enoxaparin (LOVENOX) injection 40 mg  40 mg Subcutaneous Q24H Aletha Halim, MD      . insulin aspart (novoLOG) injection 0-15 Units  0-15 Units Subcutaneous TID [facility], DO   2 Units at 10/21/15 1337  . insulin aspart (novoLOG) injection 0-5 Units  0-5 Units Subcutaneous QHS [facility], DO      . insulin detemir (LEVEMIR) injection 4 Units  4 Units Subcutaneous BID [facility] [place], [place]   4 Units at 10/21/15 2202  . insulin regular (NOVOLIN R,HUMULIN R) 250 Units in sodium chloride 0.9 % 250 mL (1 Units/mL) infusion   Intravenous Continuous Guss Bunde, MD   Stopped at 10/20/15 [phone]  . labetalol (NORMODYNE) tablet 200 mg  200 mg Oral BID Jonnie Kind, MD   200 mg at 10/21/15 2204  . lactated ringers infusion   Intravenous Continuous Lauralyn Primes [place], DO   Stopped at 10/19/15 2038  . menthol-cetylpyridinium (CEPACOL) lozenge 3 mg  1 lozenge Oral Q2H PRN [facility], DO      . nalbuphine (NUBAIN) injection 5 mg  5 mg Intravenous Q4H PRN Lyn Hollingshead, MD  Or  . nalbuphine (NUBAIN) injection 5 mg  5 mg Subcutaneous Q4H PRN Lyn Hollingshead, MD      . nalbuphine (NUBAIN) injection 5 mg  5 mg Intravenous Once PRN Lyn Hollingshead, MD       Or  . nalbuphine (NUBAIN) injection 5 mg  5 mg Subcutaneous Once PRN Lyn Hollingshead, MD      . naloxone [facility]) 2 mg in dextrose 5 % 250 mL infusion  1-4 mcg/kg/hr Intravenous Continuous PRN Lyn Hollingshead, MD      . naloxone [facility]) injection 0.4 mg  0.4 mg Intravenous PRN Lyn Hollingshead, MD       And  . sodium chloride flush (NS) 0.9 % injection 3 mL  3 mL Intravenous PRN Lyn Hollingshead, MD      . ondansetron (ZOFRAN) injection 4 mg  4 mg Intravenous Q8H PRN Lyn Hollingshead, MD      . oxyCODONE (Oxy IR/ROXICODONE) immediate release tablet 5 mg  5 mg Oral Q4H PRN Jenne Pane Degele, MD   5 mg at 10/21/15 2049   Or  . oxyCODONE (Oxy  IR/ROXICODONE) immediate release tablet 10 mg  10 mg Oral Q4H PRN Jenne Pane Degele, MD      . prenatal multivitamin tablet 1 tablet  1 tablet Oral [address] [place], DO   1 tablet at 10/21/15 1338  . scopolamine (TRANSDERM-SCOP) 1 MG/3DAYS 1.5 mg  1 patch Transdermal Once Lyn Hollingshead, MD      . senna-docusate (Senokot-S) tablet 2 tablet  2 tablet Oral Q24H [facility], DO   2 tablet at 10/21/15 2328  . simethicone (MYLICON) chewable tablet 80 mg  80 mg Oral TID [facility], DO   80 mg at 10/21/15 1758  . simethicone (MYLICON) chewable tablet 80 mg  80 mg Oral Q24H [facility], DO   80 mg at 10/21/15 2328  . simethicone (MYLICON) chewable tablet 80 mg  80 mg Oral PRN [facility], DO      . sodium chloride flush (NS) 0.9 % injection 3 mL  3 mL Intravenous PRN Guss Bunde, MD   3 mL at 10/20/15 0547  . Tdap (BOOSTRIX) injection 0.5 mL  0.5 mL Intramuscular Once [facility], DO      . zolpidem [facility]) tablet 5 mg  5 mg Oral QHS PRN Lauralyn Primes [place], [place]        Recent Labs Lab 10/19/15 1042 10/20/15 0553 10/21/15 0520  WBC 17.6* 20.8* 19.5*  HGB 7.7* 6.6* 8.8*  HCT 21.2* 17.6* 24.9*  PLT 245 220 209    Recent Labs Lab 10/20/15 0553 10/21/15 0748 10/22/15 0604  NA 133* 135 135  K 4.2 4.3 4.0  CL 109 109 108  CO2 18* 22 23  BUN 28* 30* 27*  CREATININE 1.99* 1.96* 1.81*  GLUCOSE 80 121* 107*  CALCIUM 7.3* 7.2* 7.5*   Assessment & Plan:  Pt doing well  *Postpartum/postop: routine care. Pt meeting all PP goals. BC briefly d/w pt but can follow up at [facility] visit O POS *Renal: improving. Pt told to call [facility] this week for PP follow up *cHTN with superimposed severe pre-x: continue current regimen until can be seen by nephrology *PPx: lovenox 40mg  qday. Antiphospholipid Ab syndrome panel ordered per MFM recs and if + then will do 60wsk and not two weeks of lovenox. Will call pt with  panel since it takes time for it to come back *DM1: well controlled  on current regimen *Dispo: later today. Message sent to SW to have them see her as patient is interested in this for mood check in the clinic.   Durene Romans MD Attending Center for [facility])

## 2015-10-22 NOTE — Progress Notes (Signed)

## 2015-10-22 NOTE — Clinical Social Work Maternal (Signed)
  CLINICAL SOCIAL WORK MATERNAL/CHILD NOTE  Patient Details  Name: Christine Reed MRN: [id number] Date of Birth: 1985-12-11  Date:  10/22/2015  Clinical Social Worker Initiating Note:   (Rashiya Lofland lcsw) Date/ Time Initiated:   /      Child's Name:   Christine Reed)   Legal Guardian:  Mother   Need for Interpreter:  None   Date of Referral:  10/22/15     Reason for Referral:  Parental Support of Premature Babies < 69 weeks/or Critically Ill babies    Referral Source:  Other (Comment) (CSW)   Address:   ([address] [place] [phone])  Phone number:  [phone]   Household Members:  Parents, Relatives   Natural Supports (not living in the home):  Extended Family, Spouse/significant other   Professional Supports: None   Employment: Part-time (temp work)   Type of Work:     Education:  Database administrator Resources:  [employer], Multimedia programmer   Other Resources:      Cultural/Religious Considerations Which May Impact Care:  None noted.  Strengths:  Pediatrician chosen , Ability to meet basic needs , Home prepared for child    Risk Factors/Current Problems:  Adjustment to Illness    Cognitive State:  Alert    Mood/Affect:  Calm , Relaxed        CSW Assessment: CSW met with pt and her mother to complete psychosocial/premature birth _0 .5 weeks.  Pt has a PMH significant for HTN, CKD and DM I and baby needed to be delivered early (C-Section) due to mom's co-morbidities.  Mom believes that her son, Christine Reed, was doing "well" and that she had been down to the NICU to see him this am.  Mom expressed that she is scared/anxious about having a sick baby but was thankful that she has "lots of support" around her.  CSW validated pt's feelings and assured pt that Christine Reed was in "good hands" and would be well-taken care of during his stay at [facility] NICU.  MGM agreed with this and hopes that her "first grandbaby" can come home soon.  Pt lives at home with her mother and uncle  and baby will also be d/c'ing to [facility] home when medically appropriate.  Mom reports that, along with her family, FOB is also very supportive of her/baby. (FOB asleep at pt's bedside during assessment.) Mom was working a "temp job" PTA, but  plans on seeking alternative  Employment at some point, but is not sure when.  Pt does not foresee any transportation difficulties with getting to/from hospital to care for baby/meet with medical team.  Per pt, she has all necessary supplies/equipment for caring for [facility] when he is able to d/c home and home is prepared.  Pt plans to breast feed and has rented a breast pump from the hospital to take home with her.  Pediatrician has been chosen.  Pt does not endorse any history of any mental illness/medications, however PPD was discussed and pt is agreeable to f/u with her MD for any changes in mood/behavior, post d/c.   Role of CSW/NICU babies explained.  Pt encouraged to reach out to NICU CSW for support/assistance. CSW will continue to follow baby in NICU and will be available to support MOB/FOB as necessary and appropriate.  CSW Plan/Description:  Psychosocial Support and Ongoing Assessment of Needs    Chakira Jachim, Miachel Roux, LCSW 10/22/2015, 2:40 PM

## 2015-10-22 NOTE — Plan of Care (Signed)
Problem: Education: Goal: Knowledge of disease and its progression will improve Outcome: Completed/Met Date Met: 10/22/15 Patient has long standing hx of Diabetes and renal dysfunction and understands diet and lifestyle limitations.  Problem: Nutritional: Goal: Ability to make appriopriate dietary choices will improve Outcome: Completed/Met Date Met: 10/22/15 Patient is on a Renal and Modified carb diet and has not been eating outside of her diet.

## 2015-10-22 NOTE — Lactation Note (Signed)
This note was copied from a baby's chart. Lactation Consultation Note  Patient Name: Christine Reed [id number] Date: 10/22/2015 Reason for consult: Follow-up assessment;NICU baby;Infant < 6lbs;Pump rental Infant is 52 hours old & seen by [facility] for follow-up assessment. NICU baby; mom was in her room when [facility] visited. Mom stated she has been pumping but not as often as she should be due to painful cramping and sees nothing when she pumps. Mom interested in doing [facility] loaner pump so completed paperwork, took $30 cash & issued mom a DEBP until 11/01/15. [place] referral to be sent today. Mom stated she was unsure of how to hand express so LC talked mom through it & mom did so on her right breast for a few minutes with no drops seen & then switched to her left breast where a couple drops were noted. Mom was very excited to see milk; encouraged mom to rub it into her nipple since it was just a couple drops. Reinforced importance of pumping 8-12x in 24hrs for 15-20 mins followed by hand expressing for ~5 mins. Encouraged mom to pump before leaving hospital today. Mom reports no questions at this time. Reminded mom of [facility] number to call if she has any questions once home.  Maternal Data Has patient been taught Hand Expression?: Yes (drops noted from left breast)  Feeding Feeding Type: Donor Breast Milk  LATCH Score/Interventions                      Lactation Tools Discussed/Used WIC Program: Yes   Consult Status Consult Status: Complete    [name] 10/22/2015, 11:57 AM

## 2015-10-23 LAB — CBC
HCT: 17.6 % — ABNORMAL LOW (ref 36.0–46.0)
Hemoglobin: 6.6 g/dL — CL (ref 12.0–15.0)
MCH: 31.9 pg (ref 26.0–34.0)
MCHC: 37.5 g/dL — AB (ref 30.0–36.0)
MCV: 85 fL (ref 78.0–100.0)
Platelets: 220 10*3/uL (ref 150–400)
RBC: 2.07 MIL/uL — ABNORMAL LOW (ref 3.87–5.11)
RDW: 14.8 % (ref 11.5–15.5)
WBC: 20.8 10*3/uL — ABNORMAL HIGH (ref 4.0–10.5)

## 2015-10-26 LAB — ANTIPHOSPHOLIPID SYNDROME EVAL, BLD
ANTICARDIOLIPIN IGM: 17 [MPL'U]/mL — AB (ref 0–12)
Anticardiolipin IgA: <9 APL U/mL (ref 0–11)
Anticardiolipin IgG: <9 GPL U/mL (ref 0–14)
DRVVT: 49.6 s — ABNORMAL HIGH (ref 0.0–47.0)
PTT Lupus Anticoagulant: 39.7 s (ref 0.0–51.9)
Phosphatydalserine, IgA: 1 APS IgA (ref 0–20)
Phosphatydalserine, IgG: 6 GPS IgG (ref 0–11)
Phosphatydalserine, IgM: 16 MPS IgM (ref 0–25)

## 2015-10-26 LAB — DRVVT MIX: dRVVT Mix: 42.8 s (ref 0.0–47.0)

## 2015-10-27 ENCOUNTER — Ambulatory Visit (INDEPENDENT_AMBULATORY_CARE_PROVIDER_SITE_OTHER): Payer: BLUE CROSS/BLUE SHIELD | Admitting: Clinical

## 2015-10-27 ENCOUNTER — Encounter (HOSPITAL_COMMUNITY): Payer: Self-pay | Admitting: Certified Nurse Midwife

## 2015-10-27 ENCOUNTER — Encounter: Payer: BLUE CROSS/BLUE SHIELD | Admitting: Family Medicine

## 2015-10-27 ENCOUNTER — Inpatient Hospital Stay (HOSPITAL_COMMUNITY)
Admission: AD | Admit: 2015-10-27 | Discharge: 2015-10-27 | Disposition: A | Discharge status: Home / Self Care | Payer: BLUE CROSS/BLUE SHIELD | Source: Ambulatory Visit | Attending: Obstetrics and Gynecology | Admitting: Obstetrics and Gynecology

## 2015-10-27 ENCOUNTER — Ambulatory Visit: Payer: BLUE CROSS/BLUE SHIELD | Admitting: General Practice

## 2015-10-27 VITALS — BP 133/85 | HR 93 | Ht 65.0 in | Wt 180.0 lb

## 2015-10-27 DIAGNOSIS — F4322 Adjustment disorder with anxiety: Secondary | ICD-10-CM | POA: Diagnosis not present

## 2015-10-27 DIAGNOSIS — G44201 Tension-type headache, unspecified, intractable: Secondary | ICD-10-CM

## 2015-10-27 DIAGNOSIS — R51 Headache: Secondary | ICD-10-CM | POA: Diagnosis present

## 2015-10-27 DIAGNOSIS — O1495 Unspecified pre-eclampsia, complicating the puerperium: Secondary | ICD-10-CM

## 2015-10-27 DIAGNOSIS — Z0130 Encounter for examination of blood pressure without abnormal findings: Secondary | ICD-10-CM

## 2015-10-27 DIAGNOSIS — G44209 Tension-type headache, unspecified, not intractable: Secondary | ICD-10-CM | POA: Diagnosis not present

## 2015-10-27 LAB — CBC
HEMATOCRIT: 20.6 % — AB (ref 36.0–46.0)
Hemoglobin: 7.3 g/dL — ABNORMAL LOW (ref 12.0–15.0)
MCH: 30.7 pg (ref 26.0–34.0)
MCHC: 35.4 g/dL (ref 30.0–36.0)
MCV: 86.6 fL (ref 78.0–100.0)
PLATELETS: 222 10*3/uL (ref 150–400)
RBC: 2.38 MIL/uL — ABNORMAL LOW (ref 3.87–5.11)
RDW: 14 % (ref 11.5–15.5)
WBC: 12 10*3/uL — AB (ref 4.0–10.5)

## 2015-10-27 LAB — URINALYSIS, ROUTINE W REFLEX MICROSCOPIC
BILIRUBIN URINE: NEGATIVE
GLUCOSE, UA: 100 mg/dL — AB
KETONES UR: NEGATIVE mg/dL
Leukocytes, UA: NEGATIVE
Nitrite: NEGATIVE
Specific Gravity, Urine: 1.02 (ref 1.005–1.030)
pH: 6 (ref 5.0–8.0)

## 2015-10-27 LAB — COMPREHENSIVE METABOLIC PANEL
ALBUMIN: 1.3 g/dL — AB (ref 3.5–5.0)
ALT: 21 U/L (ref 14–54)
ANION GAP: 1 — AB (ref 5–15)
AST: 26 U/L (ref 15–41)
Alkaline Phosphatase: 50 U/L (ref 38–126)
BILIRUBIN TOTAL: 0.7 mg/dL (ref 0.3–1.2)
BUN: 19 mg/dL (ref 6–20)
CHLORIDE: 112 mmol/L — AB (ref 101–111)
CO2: 24 mmol/L (ref 22–32)
Calcium: 7.8 mg/dL — ABNORMAL LOW (ref 8.9–10.3)
Creatinine, Ser: 1.63 mg/dL — ABNORMAL HIGH (ref 0.44–1.00)
GFR calc Af Amer: 49 mL/min — ABNORMAL LOW (ref >60)
GFR calc non Af Amer: 42 mL/min — ABNORMAL LOW (ref >60)
GLUCOSE: 117 mg/dL — AB (ref 65–99)
POTASSIUM: 4.4 mmol/L (ref 3.5–5.1)
SODIUM: 137 mmol/L (ref 135–145)
TOTAL PROTEIN: 4.2 g/dL — AB (ref 6.5–8.1)

## 2015-10-27 LAB — PROTEIN / CREATININE RATIO, URINE
Creatinine, Urine: 131 mg/dL
PROTEIN CREATININE RATIO: 7.54 mg/mg{creat} — AB (ref 0.00–0.15)
Total Protein, Urine: 988 mg/dL

## 2015-10-27 LAB — URINE MICROSCOPIC-ADD ON

## 2015-10-27 MED ORDER — AMLODIPINE BESYLATE 5 MG PO TABS
5.0000 mg | ORAL_TABLET | Freq: Once | ORAL | Status: AC
  Administered 2015-10-27: 5 mg via ORAL
  Filled 2015-10-27: qty 1

## 2015-10-27 MED ORDER — DEXAMETHASONE SODIUM PHOSPHATE 10 MG/ML IJ SOLN
10.0000 mg | Freq: Once | INTRAMUSCULAR | Status: AC
  Administered 2015-10-27: 10 mg via INTRAVENOUS
  Filled 2015-10-27: qty 1

## 2015-10-27 MED ORDER — METOCLOPRAMIDE HCL 5 MG/ML IJ SOLN
10.0000 mg | Freq: Once | INTRAMUSCULAR | Status: AC
  Administered 2015-10-27: 10 mg via INTRAVENOUS
  Filled 2015-10-27: qty 2

## 2015-10-27 MED ORDER — DIPHENHYDRAMINE HCL 50 MG/ML IJ SOLN
25.0000 mg | Freq: Once | INTRAMUSCULAR | Status: AC
  Administered 2015-10-27: 25 mg via INTRAVENOUS
  Filled 2015-10-27: qty 1

## 2015-10-27 MED ORDER — AMLODIPINE BESYLATE 5 MG PO TABS
5.0000 mg | ORAL_TABLET | Freq: Every day | ORAL | Status: DC
Start: 2015-10-27 — End: 2015-10-27
  Administered 2015-10-27: 5 mg via ORAL
  Filled 2015-10-27 (×2): qty 1

## 2015-10-27 MED ORDER — AMLODIPINE BESYLATE 5 MG PO TABS: 10.0000 mg | ORAL_TABLET | Freq: Every day | ORAL | 1 refills | Status: DC

## 2015-10-27 MED ORDER — SODIUM CHLORIDE 0.9 % IV SOLN: INTRAVENOUS | Status: DC

## 2015-10-27 MED FILL — ENOXAPARIN 40 MG/0.4 ML SYR: 40 | 30 days supply | Qty: 12 | Fill #2

## 2015-10-27 NOTE — MAU Note (Signed)
Pt presents today with a HA and blurred vision. Pt is 8 day PP after a primary c/section at 27 weeks.

## 2015-10-27 NOTE — Progress Notes (Signed)
Patient here for blood pressure check today. Patient reports blurry vision and headaches for past 3 days. Patient does not appear to feel well. +4 pitting edema in legs and ankles. Discussed with Dr Ilda Basset who recommends patient go to MAU for further evaluation of labs to ensure adequate kidney function & iron levels. MAU notified. Discussed with patient who states she is going to go to NICU first then go to MAU. Patient had no questions

## 2015-10-27 NOTE — BH Specialist Note (Signed)
  ASSESSMENT: Pt currently experiencing Adjustment disorder with anxious mood. Pt needs to f/u OB and [facility]. Pt would benefit from psychoeducation and brief therapeutic interventions regarding coping with symptoms of anxiety.  Stage of Change: contemplative  PLAN: 1. F/U with behavioral health clinician in one month, or as needed, if symptoms increase 2. Psychiatric Medications: none 3. Behavioral recommendations:   -Continue to visit son in NICU daily -Consider Worry hour strategy, as discussed in office visit, to prioritize life stressors/worries -Practice daily relaxation breathing technique, as practiced in office visit, as often as needed  SUBJECTIVE: Pt. referrred by Dr Rip Harbour, for symptoms of anxiety Pt. reports the following symptoms/concerns: Pt states that she is having a difficult time adjusting to being away from her newborn son in the NICU; copes by "crying a lot" and visiting him daily. Pt feels overwhelmed with stress and anxiety, has good support from family. Duration of problem: Over one week Severity: moderate   OBJECTIVE: Orientation & Cognition: Oriented x3. Thought processes normal and appropriate to situation. Mood: low, teary Affect: appropriate Appearance: appropriate Risk of harm to self or others: no known risk of harm to self or others Substance use: 13 Assessments administered: PHQ9: 7/ GAD7:   Diagnosis: Adjusment disorder with anxious mood CPT Code: F43.22  -------------------------------------------- Other(s) present in the room: none  Time spent with patient in exam room: 50 minutes, 9:40-9:30am  Depression screen [facility] 2/9 10/17/2015 08/28/2015  Decreased Interest 2 1  Down, Depressed, Hopeless 1 1  PHQ - 2 Score 3 2  Altered sleeping 3 1  Tired, decreased energy 3 1  Change in appetite 3 2  Feeling bad or failure about yourself  0 1  Trouble concentrating 2 2  Moving slowly or fidgety/restless 0 1  Suicidal thoughts 0 0  PHQ-9 Score 14 10   GAD  7 : Generalized Anxiety Score 10/17/2015 08/28/2015  Nervous, Anxious, on Edge 2 1  Control/stop worrying 1 0  Worry too much - different things 1 1  Trouble relaxing 3 1  Restless 2 0  Easily annoyed or irritable 3 2  Afraid - awful might happen 0 1  Total GAD 7 Score 12 6

## 2015-10-27 NOTE — MAU Provider Note (Signed)
Chief Complaint: Headache and Blurred Vision   First Provider Initiated Contact with Patient 10/27/15 1241      SUBJECTIVE HPI: Christine Reed is a 30 y.o. G1P0100 delivered 8 days ago via cesarean section 2/2 preeclampsia with severe features , who presents to [facility] reporting headaches and blurred vision. Patient is postpartum, but had a pregnancy and intrapartum complicated by preeclampsia with severe features, diabetic retinopathy, and CKD. She states starting 3 days ago, she developed headaches, that would only go away when she slept, and blurry vision which is worsening.    Past Medical History:  Diagnosis Date  . Anemia   . Chronic kidney disease   . Diabetes mellitus    diagnosed at age 69  . Diabetic retinopathy ([place])   . Hypertension    OB History  Gravida Para Term Preterm AB Living  1 1   1       SAB TAB Ectopic Multiple Live Births        0      # Outcome Date GA Lbr Len/2nd Weight Sex Delivery Anes PTL Lv  1 Preterm 10/19/15 [redacted]w[redacted]d  1 lb 13.3 oz (0.83 kg) M CS-LTranv Spinal  LIV     Birth Comments: preterm     Past Surgical History:  Procedure Laterality Date  . CESAREAN SECTION N/A 10/19/2015   Procedure: CESAREAN SECTION;  Surgeon: Chancy Milroy, MD;  Location: [facility];  Service: Obstetrics;  Laterality: N/A;  . lipo suction  2015  . TONSILLECTOMY    . TONSILLECTOMY AND ADENOIDECTOMY     Social History   Social History  . Marital status: Single    Spouse name: N/A  . Number of children: N/A  . Years of education: N/A   Occupational History  . Not on file.   Social History Main Topics  . Smoking status: Never Smoker  . Smokeless tobacco: Never Used  . Alcohol use No  . Drug use: No  . Sexual activity: Not Currently   Other Topics Concern  . Not on file   Social History Narrative  . No narrative on file   No current facility-administered medications on file prior to encounter.    Current Outpatient Prescriptions on  File Prior to Encounter  Medication Sig Dispense Refill  . acetaminophen (TYLENOL) 500 MG tablet Take 500 mg by mouth every 6 (six) hours as needed for mild pain or headache.    . diphenhydrAMINE (BENADRYL) 25 MG tablet Take 25 mg by mouth every 6 (six) hours as needed for itching or allergies.     Marland Kitchen enoxaparin (LOVENOX) 40 MG/0.4ML injection Inject 40 mg into the skin at bedtime.    . famotidine (PEPCID) 20 MG tablet Take 1 tablet (20 mg total) by mouth 2 (two) times daily. 60 tablet 11  . insulin aspart (NOVOLOG) 100 UNIT/ML injection Inject 0-15 Units into the skin 3 (three) times daily with meals. Based on carb counting 10:1 sliding scale 10 mL 11  . Insulin Detemir (LEVEMIR) 100 UNIT/ML Pen Inject 4 units twice daily. 15 mL 4  . labetalol (NORMODYNE) 200 MG tablet Take 1 tablet (200 mg total) by mouth 2 (two) times daily. 30 tablet 2  . oxyCODONE (OXY IR/ROXICODONE) 5 MG immediate release tablet Take 1-2 tablets (5-10 mg total) by mouth every 4 (four) hours as needed for severe pain. 40 tablet 0  . Prenatal Vit-Fe Fumarate-FA (MULTIVITAMIN-PRENATAL) 27-0.8 MG TABS tablet Take 1 tablet by mouth daily.  Allergies  Allergen Reactions  . Iodine Rash  . Nickel Rash    I have reviewed the past Medical Hx, Surgical Hx, Social Hx, Allergies and Medications.   REVIEW OF SYSTEMS  OPHTHALMIC: negative for -  decreased vision, double vision, photophobia or scotomata. +Blurred vision. RESPIRATORY: no cough, shortness of breath, or wheezing CARDIOVASCULAR: no chest pain or dyspnea on exertion GASTROINTESTINAL: no abdominal pain, change in bowel habits, or black or bloody stools negative for - epigastric or RUQ pain GENITO-URINARY: no dysuria, trouble voiding, or hematuria negative for - genital discharge, vulvar/vaginal symptoms or vaginal bleeding MUSKULOSKELETAL: negative for - gait disturbance or swelling in ankle - bilateral, foot - bilateral and leg - bilateral NEUROLOGICAL: negative  for - dizziness, gait disturbance,  numbness/tingling or visual changes. +headaches DERMATOLOGICAL: negative   OBJECTIVE Patient Vitals for the past 24 hrs:  BP Temp Temp src Pulse Resp  10/27/15 1341 155/87 - - - -  10/27/15 1302 151/94 - - 86 -  10/27/15 1233 131/85 98.5 F (36.9 C) Oral 85 18    PHYSICAL EXAM Constitutional: Well-developed, well-nourished female in no acute distress.  Cardiovascular: normal rate Respiratory: normal rate and effort.  GI: Abd soft, non-tender, gravid appropriate for gestational age. Pos BS x 4 MS: Extremities nontender, no edema, normal ROM Neurologic: Alert and oriented x 4.  GU: Neg CVAT.   LAB RESULTS Results for orders placed or performed during the hospital encounter of 10/27/15 (from the past 24 hour(s))  Urinalysis, Routine w reflex microscopic (not at [facility])     Status: Abnormal   Collection Time: 10/27/15 12:27 PM  Result Value Ref Range   Color, Urine YELLOW YELLOW   APPearance HAZY (A) CLEAR   Specific Gravity, Urine 1.020 1.005 - 1.030   pH 6.0 5.0 - 8.0   Glucose, UA 100 (A) NEGATIVE mg/dL   Hgb urine dipstick LARGE (A) NEGATIVE   Bilirubin Urine NEGATIVE NEGATIVE   Ketones, ur NEGATIVE NEGATIVE mg/dL   Protein, ur >300 (A) NEGATIVE mg/dL   Nitrite NEGATIVE NEGATIVE   Leukocytes, UA NEGATIVE NEGATIVE  Protein / creatinine ratio, urine     Status: Abnormal   Collection Time: 10/27/15 12:27 PM  Result Value Ref Range   Creatinine, Urine 131.00 mg/dL   Total Protein, Urine 988 mg/dL   Protein Creatinine Ratio 7.54 (H) 0.00 - 0.15 mg/mg[Cre]  Urine microscopic-add on     Status: Abnormal   Collection Time: 10/27/15 12:27 PM  Result Value Ref Range   Squamous Epithelial / LPF 0-5 (A) NONE SEEN   WBC, UA 6-30 0 - 5 WBC/hpf   RBC / HPF 0-5 0 - 5 RBC/hpf   Bacteria, UA FEW (A) NONE SEEN   Urine-Other AMORPHOUS URATES/PHOSPHATES   CBC     Status: Abnormal   Collection Time: 10/27/15 12:49 PM  Result Value Ref Range   WBC  12.0 (H) 4.0 - 10.5 K/uL   RBC 2.38 (L) 3.87 - 5.11 MIL/uL   Hemoglobin 7.3 (L) 12.0 - 15.0 g/dL   HCT 20.6 (L) 36.0 - 46.0 %   MCV 86.6 78.0 - 100.0 fL   MCH 30.7 26.0 - 34.0 pg   MCHC 35.4 30.0 - 36.0 g/dL   RDW 14.0 11.5 - 15.5 %   Platelets 222 150 - 400 K/uL  Comprehensive metabolic panel     Status: Abnormal   Collection Time: 10/27/15 12:49 PM  Result Value Ref Range   Sodium 137 135 - 145 mmol/L  Potassium 4.4 3.5 - 5.1 mmol/L   Chloride 112 (H) 101 - 111 mmol/L   CO2 24 22 - 32 mmol/L   Glucose, Bld 117 (H) 65 - 99 mg/dL   BUN 19 6 - 20 mg/dL   Creatinine, Ser 1.63 (H) 0.44 - 1.00 mg/dL   Calcium 7.8 (L) 8.9 - 10.3 mg/dL   Total Protein 4.2 (L) 6.5 - 8.1 g/dL   Albumin 1.3 (L) 3.5 - 5.0 g/dL   AST 26 15 - 41 U/L   ALT 21 14 - 54 U/L   Alkaline Phosphatase 50 38 - 126 U/L   Total Bilirubin 0.7 0.3 - 1.2 mg/dL   GFR calc non Af Amer 42 (L) >60 mL/min   GFR calc Af Amer 49 (L) >60 mL/min   Anion gap 1 (L) 5 - 15    IMAGING [place] [facility] Fetal Bpp Wo Non Stress  Result Date: 10/17/2015 OBSTETRICAL ULTRASOUND: This exam was performed within a [facility]. The OB US report was generated in the AS system, and faxed to the ordering physician.  This report is available in the [facility]. See the AS Obstetric US report via the Image Link.  [place] Mfm Ob Follow Up  Result Date: 10/18/2015 OBSTETRICAL ULTRASOUND: This exam was performed within a [facility]. The OB US report was generated in the AS system, and faxed to the ordering physician.  This report is available in the [facility]. See the AS Obstetric US report via the Image Link.  [place] [facility] Ua Cord Doppler  Result Date: 10/18/2015 OBSTETRICAL ULTRASOUND: This exam was performed within a  Ultrasound Department. The OB US report was generated in the AS system, and faxed to the ordering physician.  This report is available in the [facility]. See the AS Obstetric US report via the  Image Link.   MAU COURSE PreE workup - patient is refusing to be straight cath for urine, cannot use UPrCr ratio due to postpartum bleeding. Amlodipine No severe range BPs Has already receive magnesium gtt during intrapartum period, not candidate for now Migraine cocktail  3:56 PM - Patient states headache and symptoms have improved. BP stable, no severe range pressures.   MDM Plan of care reviewed with patient, including labs and tests ordered and medical treatment.   ASSESSMENT 1. Preeclampsia in postpartum period   2. Intractable tension-type headache, unspecified chronicity pattern     PLAN Discharge home in stable condition. Switching BP meds from Labetalol 200 BID to Amlodipine 10mg  qday (meds sent to pharmacy). Follow up in office on Monday.     Medication List    STOP taking these medications   labetalol 200 MG tablet Commonly known as:  NORMODYNE     TAKE these medications   acetaminophen 500 MG tablet Commonly known as:  TYLENOL Take 500 mg by mouth every 6 (six) hours as needed for mild pain or headache.   amLODipine 5 MG tablet Commonly known as:  NORVASC Take 2 tablets (10 mg total) by mouth daily. Start taking on:  10/28/2015   diphenhydrAMINE 25 MG tablet Commonly known as:  BENADRYL Take 25 mg by mouth every 6 (six) hours as needed for itching or allergies.   enoxaparin 40 MG/0.4ML injection Commonly known as:  LOVENOX Inject 40 mg into the skin at bedtime.   famotidine 20 MG tablet Commonly known as:  PEPCID Take 1 tablet (20 mg total) by mouth 2 (two) times daily.   insulin aspart  100 UNIT/ML injection Commonly known as:  novoLOG Inject 0-15 Units into the skin 3 (three) times daily with meals. Based on carb counting 10:1 sliding scale   Insulin Detemir 100 UNIT/ML Pen Commonly known as:  LEVEMIR Inject 4 units twice daily.   multivitamin-prenatal 27-0.8 MG Tabs tablet Take 1 tablet by mouth daily.   oxyCODONE 5 MG immediate  release tablet Commonly known as:  Oxy IR/ROXICODONE Take 1-2 tablets (5-10 mg total) by mouth every 4 (four) hours as needed for severe pain.        [place], [place] 10/27/2015  3:01 PM

## 2015-10-27 NOTE — Discharge Instructions (Signed)
Hypertension Hypertension is another name for high blood pressure. High blood pressure forces your heart to work harder to pump blood. A blood pressure reading has two numbers, which includes a higher number over a lower number (example: 110/72). HOME CARE   Have your blood pressure rechecked by your doctor.  Only take medicine as told by your doctor. Follow the directions carefully. The medicine does not work as well if you skip doses. Skipping doses also puts you at risk for problems.  Do not smoke.  Monitor your blood pressure at home as told by your doctor. GET HELP IF:  You think you are having a reaction to the medicine you are taking.  You have repeat headaches or feel dizzy.  You have puffiness (swelling) in your ankles.  You have trouble with your vision. GET HELP RIGHT AWAY IF:   You get a very bad headache and are confused.  You feel weak, numb, or faint.  You get chest or belly (abdominal) pain.  You throw up (vomit).  You cannot breathe very well. MAKE SURE YOU:   Understand these instructions.  Will watch your condition.  Will get help right away if you are not doing well or get worse.   This information is not intended to replace advice given to you by your health care provider. Make sure you discuss any questions you have with your health care provider.   Document Released: 07/17/2007 Document Revised: 02/02/2013 Document Reviewed: 11/20/2012 Elsevier Interactive Patient Education 2016 Elsevier Inc[employer]   Preeclampsia and Eclampsia Preeclampsia is a serious condition that develops only during pregnancy. It is also called toxemia of pregnancy. This condition causes high blood pressure along with other symptoms, such as swelling and headaches. These may develop as the condition gets worse. Preeclampsia may occur 20 weeks or later into your pregnancy.  Diagnosing and treating preeclampsia early is very important. If not treated early, it can cause serious  problems for you and your baby. One problem it can lead to is eclampsia, which is a condition that causes muscle jerking or shaking (convulsions) in the mother. Delivering your baby is the best treatment for preeclampsia or eclampsia.  RISK FACTORS The cause of preeclampsia is not known. You may be more likely to develop preeclampsia if you have certain risk factors. These include:   Being pregnant for the first time.  Having preeclampsia in a past pregnancy.  Having a family history of preeclampsia.  Having high blood pressure.  Being pregnant with twins or triplets.  Being 26 or older.  Being African American.  Having kidney disease or diabetes.  Having medical conditions such as lupus or blood diseases.  Being very overweight (obese). SIGNS AND SYMPTOMS  The earliest signs of preeclampsia are:  High blood pressure.  Increased protein in your urine. Your health care provider will check for this at every prenatal visit. Other symptoms that can develop include:   Severe headaches.  Sudden weight gain.  Swelling of your hands, face, legs, and feet.  Feeling sick to your stomach (nauseous) and throwing up (vomiting).  Vision problems (blurred or double vision).  Numbness in your face, arms, legs, and feet.  Dizziness.  Slurred speech.  Sensitivity to bright lights.  Abdominal pain. DIAGNOSIS  There are no screening tests for preeclampsia. Your health care provider will ask you about symptoms and check for signs of preeclampsia during your prenatal visits. You may also have tests, including:  Urine testing.  Blood testing.  Checking your baby's  heart rate.  Checking the health of your baby and your placenta using images created with sound waves (ultrasound). TREATMENT  You can work out the best treatment approach together with your health care provider. It is very important to keep all prenatal appointments. If you have an increased risk of preeclampsia, you  may need more frequent prenatal exams.  Your health care provider may prescribe bed rest.  You may have to eat as little salt as possible.  You may need to take medicine to lower your blood pressure if the condition does not respond to more conservative measures.  You may need to stay in the hospital if your condition is severe. There, treatment will focus on controlling your blood pressure and fluid retention. You may also need to take medicine to prevent seizures.  If the condition gets worse, your baby may need to be delivered early to protect you and the baby. You may have your labor started with medicine (be induced), or you may have a cesarean delivery.  Preeclampsia usually goes away after the baby is born. HOME CARE INSTRUCTIONS   Only take over-the-counter or prescription medicines as directed by your health care provider.  Lie on your left side while resting. This keeps pressure off your baby.  Elevate your feet while resting.  Get regular exercise. Ask your health care provider what type of exercise is safe for you.  Avoid caffeine and alcohol.  Do not smoke.  Drink 6-8 glasses of water every day.  Eat a balanced diet that is low in salt. Do not add salt to your food.  Avoid stressful situations as much as possible.  Get plenty of rest and sleep.  Keep all prenatal appointments and tests as scheduled. SEEK MEDICAL CARE IF:  You are gaining more weight than expected.  You have any headaches, abdominal pain, or nausea.  You are bruising more than usual.  You feel dizzy or light-headed. SEEK IMMEDIATE MEDICAL CARE IF:   You develop sudden or severe swelling anywhere in your body. This usually happens in the legs.  You gain 5 lb (2.3 kg) or more in a week.  You have a severe headache, dizziness, problems with your vision, or confusion.  You have severe abdominal pain.  You have lasting nausea or vomiting.  You have a seizure.  You have trouble moving  any part of your body.  You develop numbness in your body.  You have trouble speaking.  You have any abnormal bleeding.  You develop a stiff neck.  You pass out. MAKE SURE YOU:   Understand these instructions.  Will watch your condition.  Will get help right away if you are not doing well or get worse.   This information is not intended to replace advice given to you by your health care provider. Make sure you discuss any questions you have with your health care provider.   Document Released: 01/26/2000 Document Revised: 02/02/2013 Document Reviewed: 11/20/2012 Elsevier Interactive Patient Education [employer].

## 2015-10-30 NOTE — Progress Notes (Signed)
Received a call from [place], from [facility], and was informed that she did a home visit with the patient today and checked her BP which was 140/86.  Melissa informed me that the pt is currently taking Norvasc 5 mg bid starting it on Friday.  She also stated that pt is c/o daily headache, backache pain r/t epidural, and also fluid on her ankles and feet.  Notified Dr. Hulan Fray, received recommendation of f/u BP check in one week.  Melissa notified pt and pt stated that she will be able to come in on September 25th @ 1100.  [place] office notified to schedule appt.

## 2015-10-31 ENCOUNTER — Other Ambulatory Visit (HOSPITAL_COMMUNITY): Payer: Self-pay | Admitting: *Deleted

## 2015-10-31 NOTE — Telephone Encounter (Signed)
Pt states that her she is asking for a tandem pump and the paperwork that was received by insurance is for [employer]tronic and was denied please assist pt

## 2015-11-01 ENCOUNTER — Encounter (HOSPITAL_COMMUNITY)
Admission: RE | Admit: 2015-11-01 | Discharge: 2015-11-01 | Disposition: A | Discharge status: Home / Self Care | Payer: BLUE CROSS/BLUE SHIELD | Source: Ambulatory Visit | Attending: Nephrology | Admitting: Nephrology

## 2015-11-01 DIAGNOSIS — N183 Chronic kidney disease, stage 3 unspecified: Secondary | ICD-10-CM

## 2015-11-01 DIAGNOSIS — D638 Anemia in other chronic diseases classified elsewhere: Secondary | ICD-10-CM | POA: Insufficient documentation

## 2015-11-01 LAB — IRON AND TIBC
IRON: 55 ug/dL (ref 28–170)
Saturation Ratios: 29 % (ref 10.4–31.8)
TIBC: 190 ug/dL — ABNORMAL LOW (ref 250–450)
UIBC: 135 ug/dL

## 2015-11-01 LAB — RENAL FUNCTION PANEL
ALBUMIN: 1.5 g/dL — AB (ref 3.5–5.0)
Anion gap: 3 — ABNORMAL LOW (ref 5–15)
BUN: 13 mg/dL (ref 6–20)
CO2: 23 mmol/L (ref 22–32)
CREATININE: 1.59 mg/dL — AB (ref 0.44–1.00)
Calcium: 8 mg/dL — ABNORMAL LOW (ref 8.9–10.3)
Chloride: 113 mmol/L — ABNORMAL HIGH (ref 101–111)
GFR calc Af Amer: 50 mL/min — ABNORMAL LOW (ref >60)
GFR, EST NON AFRICAN AMERICAN: 43 mL/min — AB (ref >60)
Glucose, Bld: 132 mg/dL — ABNORMAL HIGH (ref 65–99)
PHOSPHORUS: 3.7 mg/dL (ref 2.5–4.6)
POTASSIUM: 4.1 mmol/L (ref 3.5–5.1)
Sodium: 139 mmol/L (ref 135–145)

## 2015-11-01 LAB — FERRITIN: Ferritin: 1227 ng/mL — ABNORMAL HIGH (ref 11–307)

## 2015-11-01 LAB — POCT HEMOGLOBIN-HEMACUE: Hemoglobin: 7.5 g/dL — ABNORMAL LOW (ref 12.0–15.0)

## 2015-11-01 MED ORDER — EPOETIN ALFA 10000 UNIT/ML IJ SOLN
INTRAMUSCULAR | Status: AC
  Filled 2015-11-01: qty 1

## 2015-11-01 MED ORDER — EPOETIN ALFA 10000 UNIT/ML IJ SOLN
10000.0000 [IU] | INTRAMUSCULAR | Status: DC
  Administered 2015-11-01: 10000 [IU] via SUBCUTANEOUS

## 2015-11-02 ENCOUNTER — Telehealth: Payer: Self-pay | Admitting: Obstetrics and Gynecology

## 2015-11-02 NOTE — Telephone Encounter (Signed)
D/w pt re: labs and no need for more than 2wks of lovenox. Pt doing well and seen recently by nephro and baby is up to 2lbs, extubated and doing well   Durene Romans MD Attending Center for [facility])

## 2015-11-06 ENCOUNTER — Ambulatory Visit: Payer: BLUE CROSS/BLUE SHIELD | Admitting: *Deleted

## 2015-11-06 VITALS — BP 136/87 | HR 105

## 2015-11-06 DIAGNOSIS — Z0130 Encounter for examination of blood pressure without abnormal findings: Secondary | ICD-10-CM

## 2015-11-06 NOTE — Progress Notes (Signed)
Patient presents for BP check. Stated she still has headaches daily, unchanged. Blurred vision most often in the mornings but not right now. Noted 3+ edema bilaterally to lower extremities. Has just started on lasix per her nephrologist. BP currently 130s/80s-90s. Discussed case with Dr Ilda Basset who was satisfied with blood pressures today. States patient should f/u with PCP. Relayed this to patient and advised to call or come in if symptoms worsen. Understanding voiced.

## 2015-11-07 MED FILL — FAMOTIDINE 20 MG TABLET: 20 | 30 days supply | Qty: 60 | Fill #4

## 2015-11-14 ENCOUNTER — Other Ambulatory Visit (INDEPENDENT_AMBULATORY_CARE_PROVIDER_SITE_OTHER): Payer: BLUE CROSS/BLUE SHIELD

## 2015-11-14 DIAGNOSIS — E1065 Type 1 diabetes mellitus with hyperglycemia: Secondary | ICD-10-CM | POA: Diagnosis not present

## 2015-11-14 LAB — BASIC METABOLIC PANEL
BUN: 17 mg/dL (ref 6–23)
CHLORIDE: 111 meq/L (ref 96–112)
CO2: 28 meq/L (ref 19–32)
CREATININE: 1.75 mg/dL — AB (ref 0.40–1.20)
Calcium: 8.1 mg/dL — ABNORMAL LOW (ref 8.4–10.5)
GFR: 44.47 mL/min — ABNORMAL LOW (ref >60.00)
Glucose, Bld: 109 mg/dL — ABNORMAL HIGH (ref 70–99)
Potassium: 4.2 mEq/L (ref 3.5–5.1)
Sodium: 143 mEq/L (ref 135–145)

## 2015-11-15 ENCOUNTER — Ambulatory Visit (HOSPITAL_COMMUNITY): Payer: BLUE CROSS/BLUE SHIELD

## 2015-11-15 ENCOUNTER — Encounter (HOSPITAL_COMMUNITY)
Admission: RE | Admit: 2015-11-15 | Discharge: 2015-11-15 | Disposition: A | Discharge status: Home / Self Care | Payer: BLUE CROSS/BLUE SHIELD | Source: Ambulatory Visit | Attending: Nephrology | Admitting: Nephrology

## 2015-11-15 DIAGNOSIS — N183 Chronic kidney disease, stage 3 unspecified: Secondary | ICD-10-CM

## 2015-11-15 DIAGNOSIS — D638 Anemia in other chronic diseases classified elsewhere: Secondary | ICD-10-CM | POA: Diagnosis not present

## 2015-11-15 LAB — POCT HEMOGLOBIN-HEMACUE: Hemoglobin: 8.1 g/dL — ABNORMAL LOW (ref 12.0–15.0)

## 2015-11-15 LAB — C-PEPTIDE

## 2015-11-15 LAB — FRUCTOSAMINE: FRUCTOSAMINE: 156 umol/L (ref 0–285)

## 2015-11-15 MED ORDER — EPOETIN ALFA 10000 UNIT/ML IJ SOLN
10000.0000 [IU] | INTRAMUSCULAR | Status: DC
  Administered 2015-11-15: 10000 [IU] via SUBCUTANEOUS

## 2015-11-15 MED ORDER — EPOETIN ALFA 10000 UNIT/ML IJ SOLN
INTRAMUSCULAR | Status: AC
  Filled 2015-11-15: qty 1

## 2015-11-17 ENCOUNTER — Encounter: Payer: Self-pay | Admitting: Endocrinology

## 2015-11-17 ENCOUNTER — Ambulatory Visit (INDEPENDENT_AMBULATORY_CARE_PROVIDER_SITE_OTHER): Payer: BLUE CROSS/BLUE SHIELD | Admitting: Endocrinology

## 2015-11-17 VITALS — BP 144/83 | HR 102 | Temp 98.1°F | Resp 14 | Ht 65.0 in | Wt 172.2 lb

## 2015-11-17 DIAGNOSIS — E1065 Type 1 diabetes mellitus with hyperglycemia: Secondary | ICD-10-CM

## 2015-11-17 NOTE — Patient Instructions (Signed)
6 Levemir in pm  No correction at bedtime unless sugars stay >240

## 2015-11-17 NOTE — Progress Notes (Signed)
Patient ID: Christine Reed, female   DOB: [birth year], 30 y.o.   MRN: [id number]           Reason for Appointment : Follow-up for Type 1 Diabetes  History of Present Illness          Diagnosis: Type 1 diabetes mellitus, date of diagnosis: 2000         Previous history:   She was initially started on NPH and Regular Insulin and subsequently was on Lantus and Humalog Usually has had poor control but records are not available for the last few years from her previous endocrinologist In April her Lantus was changed Novolin N presumably because of tendency to low sugars during the night  Recent history:   INSULIN regimen is: Humalog at meals carbohydrate coverage 1:10, correction factor 1 units per 50 mg over 150 Levemir 8 units twice a day  A1c when last checked is in the upper normal range at 6.2   Current management, blood sugar patterns and problems identified:    She had cesarean section on 10/19/15  She has continued her Levemir insulin although taking less in the evening compared to when she was last seen  Blood sugars have been significantly labile since she came back home with a standard deviation of 100  FASTING blood sugars are variable including periods of hypoglycemia overnight; however does tend to have significant rebound if she has a low sugar during the night  Blood sugars in the afternoons and around lunchtime are usually fairly good although she was having afternoon hypoglycemia last week 3 times  Tends to have mostly high readings after her evening meal but these are variable and also had hypoglycemia twice  Hypoglycemia is being treated usually with juice or cereal, she thinks that glucose tablets did not bring the blood sugar up fast enough She is generally compliant with taking her insulin at mealtimes although now since she does not have the Novolog FlexPen she may occasionally miss mealtime coverage  Glucose monitoring:  is being done 3-5 times a day          Glucometer:  Contour Blood Glucose readings from meter review of download  Mean values apply above for all meters except median for One Touch  PRE-MEAL Fasting Lunch Afternoon  Bedtime Overall  Glucose range: 56-395  94-130  57-190  47-360    Mean/median:     147+/-100    Hypoglycemia:  occurs As above Factors causing hyperglycemia: Excessive insulin, sometimes increased activity Symptoms of hypoglycemia:Weakness Treatment of hypoglycemia:As above          Self-care: The diet that the patient has been following is: Carbohydrate counting Usually eating cream of wheat at breakfast, mostly salad at lunch and dinner is usually at 8 PM with fish, rice and broccoli Snacks usually are apple or crackers          Exercise:  None recently          Dietician consultation: Most recent: 5/17 in the hospital .         CDE consultation:?  Wt Readings from Last 3 Encounters:  11/17/15 172 lb 3.2 oz (78.1 kg)  10/27/15 180 lb (81.6 kg)  10/22/15 185 lb (83.9 kg)    Diabetes labs:  Lab Results  Component Value Date   HGBA1C 6.2 09/29/2015   HGBA1C 6.2 (H) 07/17/2015   HGBA1C 12.1 02/18/2012   Lab Results  Component Value Date   LDLCALC 131 (H) 06/27/2015   CREATININE 1.75 (  H) 11/14/2015   Microalbumin ratio 5011 done in 2/17  No results found for: [facility]     Medication List       Accurate as of 11/17/15  9:15 AM. Always use your most recent med list.          acetaminophen 500 MG tablet Commonly known as:  TYLENOL Take 500 mg by mouth every 6 (six) hours as needed for mild pain or headache.   amLODipine 5 MG tablet Commonly known as:  NORVASC Take 2 tablets (10 mg total) by mouth daily.   diphenhydrAMINE 25 MG tablet Commonly known as:  BENADRYL Take 25 mg by mouth every 6 (six) hours as needed for itching or allergies.   famotidine 20 MG tablet Commonly known as:  PEPCID Take 1 tablet (20 mg total) by mouth 2 (two) times daily.   furosemide 20 MG  tablet Commonly known as:  LASIX Take 20 mg by mouth daily.   insulin aspart 100 UNIT/ML injection Commonly known as:  novoLOG Inject 0-15 Units into the skin 3 (three) times daily with meals. Based on carb counting 10:1 sliding scale   Insulin Detemir 100 UNIT/ML Pen Commonly known as:  LEVEMIR Inject 4 units twice daily.   multivitamin-prenatal 27-0.8 MG Tabs tablet Take 1 tablet by mouth daily.   oxyCODONE 5 MG immediate release tablet Commonly known as:  Oxy IR/ROXICODONE Take 1-2 tablets (5-10 mg total) by mouth every 4 (four) hours as needed for severe pain.       Allergies:  Allergies  Allergen Reactions  . Iodine Rash  . Nickel Rash    Past Medical History:  Diagnosis Date  . Anemia   . Chronic kidney disease   . Diabetes mellitus    diagnosed at age 33  . Diabetic retinopathy ([place])   . Hypertension     Past Surgical History:  Procedure Laterality Date  . CESAREAN SECTION N/A 10/19/2015   Procedure: CESAREAN SECTION;  Surgeon: Chancy Milroy, MD;  Location: [place];  Service: Obstetrics;  Laterality: N/A;  . lipo suction  2015  . TONSILLECTOMY    . TONSILLECTOMY AND ADENOIDECTOMY      Family History  Problem Relation Age of Onset  . Cancer Maternal Grandmother     colon and breast  . Diabetes Maternal Grandfather   . Diabetes Mother   . Diabetes Father   . Diabetes Paternal Grandmother   . Diabetes Paternal Grandfather     Social History:  reports that she has never smoked. She has never used smokeless tobacco. She reports that she does not drink alcohol or use drugs.      Review of Systems  She has renal insufficiency and nephrotic syndrome followed by nephrologist   LABS:  Hospital Outpatient Visit on 11/15/2015  Component Date Value Ref Range Status  . Hemoglobin 11/15/2015 8.1* 12.0 - 15.0 g/dL Final  Lab on 11/14/2015  Component Date Value Ref Range Status  . C-Peptide 11/15/2015 <0.1* 1.1 - 4.4 ng/mL Final  . Sodium  11/14/2015 143  135 - 145 mEq/L Final  . Potassium 11/14/2015 4.2  3.5 - 5.1 mEq/L Final  . Chloride 11/14/2015 111  96 - 112 mEq/L Final  . CO2 11/14/2015 28  19 - 32 mEq/L Final  . Glucose, Bld 11/14/2015 109* 70 - 99 mg/dL Final  . BUN 11/14/2015 17  6 - 23 mg/dL Final  . Creatinine, Ser 11/14/2015 1.75* 0.40 - 1.20 mg/dL Final  . Calcium 11/14/2015 8.1*  8.4 - 10.5 mg/dL Final  . GFR 11/14/2015 44.47* >60.00 mL/min Final  . Fructosamine 11/15/2015 156  0 - 285 umol/L Final   Comment: Published reference interval for apparently healthy subjects between age 58 and 15 is 85 - 285 umol/L and in a poorly controlled diabetic population is 228 - 563 umol/L with a mean of 396 umol/L.     Physical Examination:  BP (!) 144/83   Pulse (!) 102   Temp 98.1 F (36.7 C)   Resp 14   Ht 5\' 5"  (1.651 m)   Wt 172 lb 3.2 oz (78.1 kg)   SpO2 98%   BMI 28.66 kg/m   ASSESSMENT:  Diabetes type 1 With labile blood sugars See history of present illness for detailed discussion of current diabetes management, blood sugar patterns and problems identified  Since her pregnancy her blood sugars have been more labile Has had less requirement for evening basal insulin but still is having some tendency to overnight hypoglycemia Postprandial readings are quite variable in the evenings after supper although recently better Has significant rebound after low blood sugars if she has hypoglycemia during the night or early morning  She is generally compliant with taking her insulin at mealtimes although now since she does not have the Novolog FlexPen she may occasionally miss mealtime coverage  Although her fructosamine is only 152 this is falsely low because of her low albumin  PLAN:    Reduce Levemir to 6 units in evening for now  She will start the [employer] insulin pump when available, discussed how this will be helpful in her control and avoiding hypoglycemia  She will get the NovoLog FlexPen to  improve compliance with mealtime insulin  No change in carbohydrate ratio  Avoid correcting high readings at bedtime unless over 240  Continue follow-up with nephrologist for hypertension  There are no Patient Instructions on file for this visit.  Counseling time on subjects discussed above is over 50% of today's 25 minute visit   Arieliz Latino 11/17/2015, 9:15 AM   Note: This note was prepared with Estate agent. Any transcriptional errors that result from this process are unintentional.

## 2015-11-26 ENCOUNTER — Other Ambulatory Visit: Payer: Self-pay | Admitting: Family Medicine

## 2015-11-27 ENCOUNTER — Encounter: Payer: Self-pay | Admitting: Family Medicine

## 2015-11-29 ENCOUNTER — Encounter (HOSPITAL_COMMUNITY)
Admission: RE | Admit: 2015-11-29 | Discharge: 2015-11-29 | Disposition: A | Discharge status: Home / Self Care | Payer: BLUE CROSS/BLUE SHIELD | Source: Ambulatory Visit | Attending: Nephrology | Admitting: Nephrology

## 2015-11-29 DIAGNOSIS — N183 Chronic kidney disease, stage 3 unspecified: Secondary | ICD-10-CM

## 2015-11-29 DIAGNOSIS — D638 Anemia in other chronic diseases classified elsewhere: Secondary | ICD-10-CM

## 2015-11-29 LAB — CBC
HCT: 24.9 % — ABNORMAL LOW (ref 36.0–46.0)
HEMOGLOBIN: 8.6 g/dL — AB (ref 12.0–15.0)
MCH: 30 pg (ref 26.0–34.0)
MCHC: 34.5 g/dL (ref 30.0–36.0)
MCV: 86.8 fL (ref 78.0–100.0)
Platelets: 260 10*3/uL (ref 150–400)
RBC: 2.87 MIL/uL — ABNORMAL LOW (ref 3.87–5.11)
RDW: 12.7 % (ref 11.5–15.5)
WBC: 8.9 10*3/uL (ref 4.0–10.5)

## 2015-11-29 MED ORDER — EPOETIN ALFA 10000 UNIT/ML IJ SOLN
10000.0000 [IU] | INTRAMUSCULAR | Status: DC
  Administered 2015-11-29: 10000 [IU] via SUBCUTANEOUS

## 2015-11-29 MED ORDER — EPOETIN ALFA 10000 UNIT/ML IJ SOLN
INTRAMUSCULAR | Status: AC
  Filled 2015-11-29: qty 1

## 2015-11-30 ENCOUNTER — Encounter: Payer: Self-pay | Admitting: Family

## 2015-11-30 ENCOUNTER — Ambulatory Visit (INDEPENDENT_AMBULATORY_CARE_PROVIDER_SITE_OTHER): Payer: Self-pay | Admitting: Clinical

## 2015-11-30 ENCOUNTER — Ambulatory Visit (INDEPENDENT_AMBULATORY_CARE_PROVIDER_SITE_OTHER): Payer: BLUE CROSS/BLUE SHIELD | Admitting: Family

## 2015-11-30 DIAGNOSIS — F4322 Adjustment disorder with anxiety: Secondary | ICD-10-CM

## 2015-11-30 MED ORDER — AMLODIPINE BESYLATE 10 MG PO TABS: 10.0000 mg | ORAL_TABLET | Freq: Every day | ORAL | 0 refills | Status: DC

## 2015-11-30 NOTE — BH Specialist Note (Signed)
Session Start time: 10:15   End Time: 10:35 Total Time:  20 minutes Type of Service: [place]: No.   Interpreter Name & Language: n/a # [facility] Visits July 2017-June 2018: 1st   SUBJECTIVE: Christine Reed is a 30 y.o. female  Pt. was referred by Kathrine Haddock for:  anxiety and depression. Pt. reports the following symptoms/concerns: Pt states that she is feeling much better since she has been able to see her baby daily in NICU; doing daily relaxation breathing exercises.  Duration of problem:  Over one month Severity: mild Previous treatment: none   OBJECTIVE: Mood: Appropriate & Affect: Appropriate Risk of harm to self or others: no known risk of harm to self or others Assessments administered: PHQ9: 8/ GAD7: 10  LIFE CONTEXT:  Family & Social: Supportive friends and family  School/ Work: [employer] Self-Care: Sleeping and eating well, staying busy and active Life changes: Baby in NICU What is important to pt/family (values): Family   GOALS ADDRESSED:  -Alleviate symptoms of anxiety  INTERVENTIONS: Strength-based   ASSESSMENT:  Pt currently experiencing Adjustment disorder with anxious mood.  Pt may benefit from continued brief interventions regarding coping with anxiety.      PLAN: 1. F/U with behavioral health clinician: as needed 2. Behavioral Health meds: none 3. Behavioral recommendations:  -Continue daily relaxation breathing -Consider using calming apps for additional self-care -Continue visiting son daily, and continue coaching cheer team 4. Referral: Brief Counseling/Psychotherapy 5. From scale of 1-10, how likely are you to follow plan: [place]:   Warm Hand Off Completed.        Depression screen [facility] 2/9 11/30/2015 10/27/2015 10/17/2015 08/28/2015  Decreased Interest 0 1 2 1   Down, Depressed, Hopeless 0 1 1 1   PHQ - 2 Score 0 2 3 2    Altered sleeping 2 1 3 1   Tired, decreased energy 2 2 3 1   Change in appetite 2 1 3 2   Feeling bad or failure about yourself  0 0 0 1  Trouble concentrating 1 1 2 2   Moving slowly or fidgety/restless 1 0 0 1  Suicidal thoughts 0 0 0 0  PHQ-9 Score 8 7 14 10    GAD 7 : Generalized Anxiety Score 11/30/2015 10/27/2015 10/17/2015 08/28/2015  Nervous, Anxious, on Edge 2 2 2 1   Control/stop worrying 2 2 1  0  Worry too much - different things 2 1 1 1   Trouble relaxing 2 2 3 1   Restless 2 2 2  0  Easily annoyed or irritable 2 2 3 2   Afraid - awful might happen 0 2 0 1  Total GAD 7 Score 12 13 12  6

## 2015-11-30 NOTE — Progress Notes (Signed)
Subjective:     Christine Reed is a 30 y.o. female who presents for a postpartum visit. She is 6 weeks postpartum following a low cervical transverse Cesarean section. I have fully reviewed the prenatal and intrapartum course. The delivery was at 27 gestational weeks. Outcome: c-section low transverse. Anesthesia: Spinal . Postpartum course has been uncomplicated. Baby's course has been complicated by admission in NICU, reports Rolla Plate is doing well, now 3 lbs. Baby is feeding by bottle. Bleeding not at this time. Bowel function is normal. Bladder function is normal. Patient is not sexually active. Contraception method is none.  Desires IUD, but would like to reschedule.  Patient reports getting follow-up for diabetes and renal disease with endocrinologist and nephrologist since delivery with scheduled follow-up.  Needs refill of Norvasc.  Postpartum depression screening: positive.  The following portions of the patient's history were reviewed and updated as appropriate: allergies, current medications, past family history, past medical history, past social history, past surgical history and problem list.  Review of Systems Pertinent items are noted in HPI.   Objective:   Vitals:   11/30/15 0904  BP: 117/76  Pulse: 89      Wt 164 lb (74.4 kg)   BMI 27.29 kg/m   General:  alert, cooperative and appears stated age   Breasts:  inspection negative, no nipple discharge or bleeding, no masses or nodularity palpable  Lungs: clear to auscultation bilaterally  Heart:  regular rate and rhythm, S1, S2 normal, no murmur, click, rub or gallop  Abdomen: soft, non-tender; bowel sounds normal; no masses,  no organomegaly; incision site without signs of infection; well approximated  Pelvic exam not indicated - not bleeding, no pain at vaginal area      Assessment:     Normal postpartum exam. Pap smear not done at today's visit.   Plan:    1. Contraception: plans to reschedule for IUD 2. RX Norvasc 10 mg  QD 3. Follow up in: 2 weeks or as needed.    Venia Carbon Michiel Cowboy, CNM

## 2015-12-07 MED FILL — FAMOTIDINE 20 MG TABLET: 20 | 30 days supply | Qty: 60 | Fill #5

## 2015-12-13 ENCOUNTER — Encounter (HOSPITAL_COMMUNITY)
Admission: RE | Admit: 2015-12-13 | Discharge: 2015-12-13 | Disposition: A | Discharge status: Home / Self Care | Payer: BLUE CROSS/BLUE SHIELD | Source: Ambulatory Visit | Attending: Nephrology | Admitting: Nephrology

## 2015-12-13 DIAGNOSIS — N183 Chronic kidney disease, stage 3 unspecified: Secondary | ICD-10-CM

## 2015-12-13 DIAGNOSIS — D638 Anemia in other chronic diseases classified elsewhere: Secondary | ICD-10-CM | POA: Diagnosis not present

## 2015-12-13 LAB — POCT HEMOGLOBIN-HEMACUE: HEMOGLOBIN: 8.9 g/dL — AB (ref 12.0–15.0)

## 2015-12-13 MED ORDER — EPOETIN ALFA 10000 UNIT/ML IJ SOLN
10000.0000 [IU] | INTRAMUSCULAR | Status: DC
  Administered 2015-12-13: 10000 [IU] via SUBCUTANEOUS

## 2015-12-13 MED ORDER — EPOETIN ALFA 10000 UNIT/ML IJ SOLN
INTRAMUSCULAR | Status: AC
  Filled 2015-12-13: qty 1

## 2015-12-14 ENCOUNTER — Other Ambulatory Visit: Payer: Self-pay | Admitting: Endocrinology

## 2015-12-15 ENCOUNTER — Other Ambulatory Visit (HOSPITAL_COMMUNITY): Payer: Self-pay | Admitting: Nephrology

## 2015-12-15 DIAGNOSIS — N049 Nephrotic syndrome with unspecified morphologic changes: Secondary | ICD-10-CM

## 2015-12-21 ENCOUNTER — Ambulatory Visit: Payer: Self-pay | Admitting: Family Medicine

## 2015-12-25 ENCOUNTER — Other Ambulatory Visit: Payer: Self-pay | Admitting: Radiology

## 2015-12-26 ENCOUNTER — Encounter (HOSPITAL_COMMUNITY): Payer: Self-pay

## 2015-12-26 ENCOUNTER — Ambulatory Visit (HOSPITAL_COMMUNITY)
Admission: RE | Admit: 2015-12-26 | Discharge: 2015-12-26 | Disposition: A | Discharge status: Home / Self Care | Payer: BLUE CROSS/BLUE SHIELD | Source: Ambulatory Visit | Attending: Nephrology | Admitting: Nephrology

## 2015-12-26 DIAGNOSIS — N183 Chronic kidney disease, stage 3 (moderate): Secondary | ICD-10-CM | POA: Diagnosis not present

## 2015-12-26 DIAGNOSIS — E1121 Type 2 diabetes mellitus with diabetic nephropathy: Secondary | ICD-10-CM | POA: Insufficient documentation

## 2015-12-26 DIAGNOSIS — Z794 Long term (current) use of insulin: Secondary | ICD-10-CM | POA: Insufficient documentation

## 2015-12-26 DIAGNOSIS — N049 Nephrotic syndrome with unspecified morphologic changes: Secondary | ICD-10-CM

## 2015-12-26 DIAGNOSIS — D649 Anemia, unspecified: Secondary | ICD-10-CM | POA: Insufficient documentation

## 2015-12-26 DIAGNOSIS — I129 Hypertensive chronic kidney disease with stage 1 through stage 4 chronic kidney disease, or unspecified chronic kidney disease: Secondary | ICD-10-CM | POA: Insufficient documentation

## 2015-12-26 DIAGNOSIS — E1122 Type 2 diabetes mellitus with diabetic chronic kidney disease: Secondary | ICD-10-CM | POA: Insufficient documentation

## 2015-12-26 LAB — PROTIME-INR
INR: 0.92
Prothrombin Time: 12.3 seconds (ref 11.4–15.2)

## 2015-12-26 LAB — CBC
HEMATOCRIT: 27.8 % — AB (ref 36.0–46.0)
HEMOGLOBIN: 9.6 g/dL — AB (ref 12.0–15.0)
MCH: 30 pg (ref 26.0–34.0)
MCHC: 34.5 g/dL (ref 30.0–36.0)
MCV: 86.9 fL (ref 78.0–100.0)
Platelets: 270 10*3/uL (ref 150–400)
RBC: 3.2 MIL/uL — ABNORMAL LOW (ref 3.87–5.11)
RDW: 12.8 % (ref 11.5–15.5)
WBC: 7.5 10*3/uL (ref 4.0–10.5)

## 2015-12-26 LAB — APTT: APTT: 29 s (ref 24–36)

## 2015-12-26 LAB — GLUCOSE, CAPILLARY: GLUCOSE-CAPILLARY: 192 mg/dL — AB (ref 65–99)

## 2015-12-26 LAB — PREGNANCY, URINE: PREG TEST UR: NEGATIVE

## 2015-12-26 MED ORDER — FENTANYL CITRATE (PF) 100 MCG/2ML IJ SOLN
INTRAMUSCULAR | Status: AC | PRN
  Administered 2015-12-26: 50 ug via INTRAVENOUS
  Administered 2015-12-26 (×2): 25 ug via INTRAVENOUS

## 2015-12-26 MED ORDER — SODIUM CHLORIDE 0.9 % IV SOLN
INTRAVENOUS | Status: AC | PRN
Start: 2015-12-26 — End: 2015-12-26
  Administered 2015-12-26: 10 mL/h via INTRAVENOUS

## 2015-12-26 MED ORDER — AMLODIPINE BESYLATE 10 MG PO TABS
10.0000 mg | ORAL_TABLET | Freq: Once | ORAL | Status: AC
  Administered 2015-12-26: 10 mg via ORAL
  Filled 2015-12-26: qty 1

## 2015-12-26 MED ORDER — SODIUM CHLORIDE 0.9 % IV SOLN: INTRAVENOUS | Status: DC

## 2015-12-26 MED ORDER — LIDOCAINE HCL 1 % IJ SOLN
INTRAMUSCULAR | Status: AC
  Filled 2015-12-26: qty 20

## 2015-12-26 MED ORDER — MIDAZOLAM HCL 2 MG/2ML IJ SOLN
INTRAMUSCULAR | Status: AC
  Filled 2015-12-26: qty 2

## 2015-12-26 MED ORDER — FENTANYL CITRATE (PF) 100 MCG/2ML IJ SOLN
INTRAMUSCULAR | Status: AC
  Filled 2015-12-26: qty 2

## 2015-12-26 MED ORDER — MIDAZOLAM HCL 2 MG/2ML IJ SOLN
INTRAMUSCULAR | Status: AC | PRN
Start: 2015-12-26 — End: 2015-12-26
  Administered 2015-12-26 (×2): 0.5 mg via INTRAVENOUS
  Administered 2015-12-26: 1 mg via INTRAVENOUS

## 2015-12-26 NOTE — H&P (Signed)
Chief Complaint: Patient was seen in consultation today for random renal biopsy at the request of [place]  Referring Physician(s): Coladonato,Joseph  Supervising Physician: Marybelle Killings  Patient Status: [facility] - Out-pt  History of Present Illness: Christine Reed is a 30 y.o. female   Approx 10 week postpartum (10/19/2015) Known CKD stage III Has been followed for proteinuria since May 2017 Hx Uncontrolled HTN and Diabetes  Follows with Dr Marval Regal Persistent proteinuria worsening  Nephrotic syndrome Now scheduled for random renal biopsy   Past Medical History:  Diagnosis Date  . Anemia   . Chronic kidney disease   . Diabetes mellitus    diagnosed at age 31  . Diabetic retinopathy ([place])   . Hypertension     Past Surgical History:  Procedure Laterality Date  . CESAREAN SECTION N/A 10/19/2015   Procedure: CESAREAN SECTION;  Surgeon: Chancy Milroy, MD;  Location: [place];  Service: Obstetrics;  Laterality: N/A;  . lipo suction  2015  . TONSILLECTOMY    . TONSILLECTOMY AND ADENOIDECTOMY      Allergies: Iodine and Nickel  Medications: Prior to Admission medications   Medication Sig Start Date End Date Taking? Authorizing Provider  acetaminophen (TYLENOL) 500 MG tablet Take 500 mg by mouth every 6 (six) hours as needed for mild pain or headache.   Yes Historical Provider, MD  amLODipine (NORVASC) 10 MG tablet Take 1 tablet (10 mg total) by mouth daily. 11/30/15  Yes Gwen Pounds, CNM  diphenhydrAMINE (BENADRYL) 25 MG tablet Take 25 mg by mouth every 6 (six) hours as needed for itching or allergies.    Yes Historical Provider, MD  famotidine (PEPCID) 20 MG tablet Take 1 tablet (20 mg total) by mouth 2 (two) times daily. 06/28/15  Yes Vanessa Kick, MD  furosemide (LASIX) 20 MG tablet Take 20 mg by mouth daily.   Yes Historical Provider, MD  insulin aspart (NOVOLOG) 100 UNIT/ML injection Inject 0-15 Units into the skin 3 (three) times daily with  meals. Based on carb counting 10:1 sliding scale 10/22/15  Yes Aletha Halim, MD  Insulin Detemir (LEVEMIR) 100 UNIT/ML Pen Inject 4 units twice daily. Patient taking differently: Inject 8 units twice daily. 10/22/15  Yes Aletha Halim, MD  Prenatal Vit-Fe Fumarate-FA (MULTIVITAMIN-PRENATAL) 27-0.8 MG TABS tablet Take 1 tablet by mouth daily.    Yes Historical Provider, MD  BAYER CONTOUR NEXT TEST test strip USE TEST STRIPS TO CHECK BLOOD SUGAR 6 TIMES DAILY. 12/15/15   Elayne Snare, MD     Family History  Problem Relation Age of Onset  . Cancer Maternal Grandmother     colon and breast  . Diabetes Maternal Grandfather   . Diabetes Mother   . Diabetes Father   . Diabetes Paternal Grandmother   . Diabetes Paternal Grandfather     Social History   Social History  . Marital status: Single    Spouse name: N/A  . Number of children: N/A  . Years of education: N/A   Social History Main Topics  . Smoking status: Never Smoker  . Smokeless tobacco: Never Used  . Alcohol use No  . Drug use: No  . Sexual activity: Not Currently   Other Topics Concern  . None   Social History Narrative  . None    Review of Systems: A 12 point ROS discussed and pertinent positives are indicated in the HPI above.  All other systems are negative.  Review of Systems  Constitutional: Negative for activity  change, fatigue and fever.  Respiratory: Negative for shortness of breath.   Gastrointestinal: Negative for abdominal pain.  Neurological: Negative for weakness.  Psychiatric/Behavioral: Negative for behavioral problems and confusion.    Vital Signs: BP (!) 141/95   Pulse 84   Temp 98.8 F (37.1 C) (Oral)   Resp 16   Ht 5\' 5"  (1.651 m)   Wt 160 lb (72.6 kg)   LMP 12/11/2015   SpO2 100%   Breastfeeding? No   BMI 26.63 kg/m   Physical Exam  Constitutional: She is oriented to person, place, and time. She appears well-nourished.  Cardiovascular: Normal rate and regular rhythm.     Pulmonary/Chest: Effort normal and breath sounds normal.  Abdominal: Soft. Bowel sounds are normal.  Musculoskeletal: Normal range of motion.  Neurological: She is alert and oriented to person, place, and time.  Skin: Skin is warm and dry.  Psychiatric: She has a normal mood and affect. Her behavior is normal. Judgment and thought content normal.  Nursing note and vitals reviewed.   Mallampati Score:  MD Evaluation Airway: WNL Heart: WNL Abdomen: WNL Chest/ Lungs: WNL ASA  Classification: 3 Mallampati/Airway Score: One  Imaging: No results found.  Labs:  CBC:  Recent Labs  10/21/15 0520 10/27/15 1249  11/15/15 1212 11/29/15 1302 12/13/15 1234 12/26/15 0610  WBC 19.5* 12.0*  --   --  8.9  --  7.5  HGB 8.8* 7.3*  < > 8.1* 8.6* 8.9* 9.6*  HCT 24.9* 20.6*  --   --  24.9*  --  27.8*  PLT 209 222  --   --  260  --  270  < > = values in this interval not displayed.  COAGS:  Recent Labs  12/26/15 0610  INR 0.92    BMP:  Recent Labs  10/21/15 0748 10/22/15 0604 10/27/15 1249 11/01/15 1355 11/14/15 1012  NA 135 135 137 139 143  K 4.3 4.0 4.4 4.1 4.2  CL 109 108 112* 113* 111  CO2 22 23 24 23 28   GLUCOSE 121* 107* 117* 132* 109*  BUN 30* 27* 19 13 17   CALCIUM 7.2* 7.5* 7.8* 8.0* 8.1*  CREATININE 1.96* 1.81* 1.63* 1.59* 1.75*  GFRNONAA 34* 37* 42* 43*  --   GFRAA 39* 43* 49* 50*  --     LIVER FUNCTION TESTS:  Recent Labs  10/19/15 0246 10/19/15 1042 10/20/15 0553 10/27/15 1249 11/01/15 1355  BILITOT 0.4 0.3 0.3 0.7  --   AST 23 21 27 26   --   ALT 24 23 28 21   --   ALKPHOS 48 41 42 50  --   PROT 5.2* 4.5* 4.0* 4.2*  --   ALBUMIN 1.4* 1.3* 1.2* 1.3* 1.5*    TUMOR MARKERS: No results for input(s): AFPTM, CEA, CA199, CHROMGRNA in the last 8760 hours.  Assessment and Plan:  CKD III Persistent proteinuria Nephrotic syndrome Now scheduled for random renal biopsy Risks and Benefits discussed with the patient including, but not limited to  bleeding, infection, damage to adjacent structures or low yield requiring additional tests. All of the patient's questions were answered, patient is agreeable to proceed. Consent signed and in chart.   Thank you for this interesting consult.  I greatly enjoyed meeting [name] and look forward to participating in their care.  A copy of this report was sent to the requesting provider on this date.  Electronically Signed: Rosann Gorum A 12/26/2015, 7:20 AM   I spent a total of  30  Minutes   in face to face in clinical consultation, greater than 50% of which was counseling/coordinating care for random renal biopsy

## 2015-12-26 NOTE — Discharge Instructions (Signed)
Percutaneous Kidney Biopsy, Care After °This sheet gives you information about how to care for yourself after your procedure. Your health care provider may also give you more specific instructions. If you have problems or questions, contact your health care provider. °What can I expect after the procedure? °After the procedure, it is common to have: °· Pain or soreness near the area where the needle went through your skin (biopsy site). °· Bright pink or cloudy urine for 24 hours after the procedure. °Follow these instructions at home: °Activity  °· Return to your normal activities as told by your health care provider. Ask your health care provider what activities are safe for you. °· Do not drive for 24 hours if you were given a medicine to help you relax (sedative). °· Do not lift anything that is heavier than 10 lb (4.5 kg) until your health care provider tells you that it is safe. °· Avoid activities that take a lot of effort (are strenuous) until your health care provider approves. Most people will have to wait 2 weeks before returning to activities such as exercise or sexual intercourse. °General instructions  °· Take over-the-counter and prescription medicines only as told by your health care provider. °· You may eat and drink after your procedure. Follow instructions from your health care provider about eating or drinking restrictions. °· Check your biopsy site every day for signs of infection. Check for: °¨ More redness, swelling, or pain. °¨ More fluid or blood. °¨ Warmth. °¨ Pus or a bad smell. °· Keep all follow-up visits as told by your health care provider. This is important. °Contact a health care provider if: °· You have more redness, swelling, or pain around your biopsy site. °· You have more fluid or blood coming from your biopsy site. °· Your biopsy site feels warm to the touch. °· You have pus or a bad smell coming from your biopsy site. °· You have blood in your urine more than 24 hours after  your procedure. °Get help right away if: °· You have dark red or brown urine. °· You have a fever. °· You are unable to urinate. °· You feel burning when you urinate. °· You feel faint. °· You have severe pain in your abdomen or side. °This information is not intended to replace advice given to you by your health care provider. Make sure you discuss any questions you have with your health care provider. °Document Released: 09/30/2012 Document Revised: 11/10/2015 Document Reviewed: 11/10/2015 °Elsevier Interactive Patient Education © 2017 [employer] ° °

## 2015-12-26 NOTE — Procedures (Signed)
Random renal core biopsy 16 gauge times 2 No comp/EBL

## 2015-12-27 ENCOUNTER — Encounter (HOSPITAL_COMMUNITY): Payer: Self-pay

## 2015-12-27 ENCOUNTER — Other Ambulatory Visit (HOSPITAL_COMMUNITY): Payer: Self-pay | Admitting: *Deleted

## 2015-12-28 ENCOUNTER — Inpatient Hospital Stay (HOSPITAL_COMMUNITY): Admission: RE | Admit: 2015-12-28 | Payer: Self-pay | Source: Ambulatory Visit

## 2016-01-01 ENCOUNTER — Encounter (HOSPITAL_COMMUNITY): Payer: Self-pay

## 2016-01-01 ENCOUNTER — Encounter: Payer: Self-pay | Admitting: Student

## 2016-01-01 ENCOUNTER — Ambulatory Visit (INDEPENDENT_AMBULATORY_CARE_PROVIDER_SITE_OTHER): Payer: BLUE CROSS/BLUE SHIELD | Admitting: Student

## 2016-01-01 VITALS — BP 130/81 | HR 65 | Wt 167.5 lb

## 2016-01-01 DIAGNOSIS — Z30430 Encounter for insertion of intrauterine contraceptive device: Secondary | ICD-10-CM

## 2016-01-01 DIAGNOSIS — Z3202 Encounter for pregnancy test, result negative: Secondary | ICD-10-CM

## 2016-01-01 LAB — POCT PREGNANCY, URINE: Preg Test, Ur: NEGATIVE

## 2016-01-01 MED ORDER — LEVONORGESTREL 18.6 MCG/DAY IU IUD
INTRAUTERINE_SYSTEM | Freq: Once | INTRAUTERINE | Status: AC
  Administered 2016-01-01: 15:00:00 via INTRAUTERINE

## 2016-01-01 NOTE — Progress Notes (Signed)
   [facility] PROCEDURE NOTE  Christine Reed is a 30 y.o. G1P0100 here for [place] IUD insertion. No GYN concerns.  Last pap smear was on 04/11/15 and was normal.  IUD Insertion Procedure Note Patient identified, informed consent performed, consent signed.   Discussed risks of irregular bleeding, cramping, infection, malpositioning or misplacement of the IUD outside the uterus which may require further procedure such as laparoscopy. Time out was performed.  Urine pregnancy test negative.  Speculum placed in the vagina.  Cervix visualized.  Cleaned with Betadine x 2.  Grasped anteriorly with a single tooth tenaculum.  Uterus sounded to 7 cm. Liletta IUD placed per manufacturer's recommendations.  Strings trimmed to 3 cm. Tenaculum was removed, good hemostasis noted.  Patient tolerated procedure well.   Patient was given post-procedure instructions.  She was advised to have backup contraception for one week.  Patient was also asked to check IUD strings periodically and follow up in 4 weeks for IUD check.

## 2016-01-01 NOTE — Addendum Note (Signed)
Addended by: Phillip Heal, DEMETRICE A on: 01/01/2016 03:22 PM   Modules accepted: Orders

## 2016-01-01 NOTE — Patient Instructions (Signed)
Intrauterine Device Insertion, Care After Refer to this sheet in the next few weeks. These instructions provide you with information on caring for yourself after your procedure. Your health care provider may also give you more specific instructions. Your treatment has been planned according to current medical practices, but problems sometimes occur. Call your health care provider if you have any problems or questions after your procedure. WHAT TO EXPECT AFTER THE PROCEDURE Insertion of the IUD may cause some discomfort, such as cramping. The cramping should improve after the IUD is in place. You may have bleeding after the procedure. This is normal. It varies from light spotting for a few days to menstrual-like bleeding. When the IUD is in place, a string will extend past the cervix into the vagina for 1-2 inches. The strings should not bother you or your partner. If they do, talk to your health care provider.  HOME CARE INSTRUCTIONS   Check your intrauterine device (IUD) to make sure it is in place before you resume sexual activity. You should be able to feel the strings. If you cannot feel the strings, something may be wrong. The IUD may have fallen out of the uterus, or the uterus may have been punctured (perforated) during placement. Also, if the strings are getting longer, it may mean that the IUD is being forced out of the uterus. You no longer have full protection from pregnancy if any of these problems occur.  You may resume sexual intercourse if you are not having problems with the IUD. The copper IUD is considered immediately effective, and the hormone IUD works right away if inserted within 7 days of your period starting. You will need to use a backup method of birth control for 7 days if the IUD in inserted at any other time in your cycle.  Continue to check that the IUD is still in place by feeling for the strings after every menstrual period.  You may need to take pain medicine such as  acetaminophen or ibuprofen. Only take medicines as directed by your health care provider. SEEK MEDICAL CARE IF:   You have bleeding that is heavier or lasts longer than a normal menstrual cycle.  You have a fever.  You have increasing cramps or abdominal pain not relieved with medicine.  You have abdominal pain that does not seem to be related to the same area of earlier cramping and pain.  You are lightheaded, unusually weak, or faint.  You have abnormal vaginal discharge or smells.  You have pain during sexual intercourse.  You cannot feel the IUD strings, or the IUD string has gotten longer.  You feel the IUD at the opening of the cervix in the vagina.  You think you are pregnant, or you miss your menstrual period.  The IUD string is hurting your sex partner. MAKE SURE YOU:  Understand these instructions.  Will watch your condition.  Will get help right away if you are not doing well or get worse. This information is not intended to replace advice given to you by your health care provider. Make sure you discuss any questions you have with your health care provider. Document Released: 09/26/2010 Document Revised: 11/18/2012 Document Reviewed: 07/19/2012 Elsevier Interactive Patient Education  2017 [employer].

## 2016-01-02 ENCOUNTER — Inpatient Hospital Stay (HOSPITAL_COMMUNITY): Admission: RE | Admit: 2016-01-02 | Payer: Self-pay | Source: Ambulatory Visit

## 2016-01-02 ENCOUNTER — Other Ambulatory Visit: Payer: Self-pay | Admitting: Family

## 2016-01-12 ENCOUNTER — Other Ambulatory Visit: Payer: BLUE CROSS/BLUE SHIELD

## 2016-01-17 ENCOUNTER — Ambulatory Visit: Payer: BLUE CROSS/BLUE SHIELD | Admitting: Endocrinology

## 2016-01-17 ENCOUNTER — Other Ambulatory Visit (INDEPENDENT_AMBULATORY_CARE_PROVIDER_SITE_OTHER): Payer: BLUE CROSS/BLUE SHIELD

## 2016-01-17 DIAGNOSIS — E1065 Type 1 diabetes mellitus with hyperglycemia: Secondary | ICD-10-CM | POA: Diagnosis not present

## 2016-01-17 LAB — LIPID PANEL
CHOL/HDL RATIO: 3
Cholesterol: 203 mg/dL — ABNORMAL HIGH (ref 0–200)
HDL: 74.9 mg/dL (ref >39.00)
LDL CALC: 106 mg/dL — AB (ref 0–99)
NonHDL: 128.57
Triglycerides: 113 mg/dL (ref 0.0–149.0)
VLDL: 22.6 mg/dL (ref 0.0–40.0)

## 2016-01-17 LAB — BASIC METABOLIC PANEL WITH GFR
BUN: 14 mg/dL (ref 6–23)
CO2: 24 meq/L (ref 19–32)
Calcium: 8 mg/dL — ABNORMAL LOW (ref 8.4–10.5)
Chloride: 109 meq/L (ref 96–112)
Creatinine, Ser: 1.81 mg/dL — ABNORMAL HIGH (ref 0.40–1.20)
GFR: 42.72 mL/min — ABNORMAL LOW
Glucose, Bld: 215 mg/dL — ABNORMAL HIGH (ref 70–99)
Potassium: 4 meq/L (ref 3.5–5.1)
Sodium: 136 meq/L (ref 135–145)

## 2016-01-17 LAB — HEMOGLOBIN A1C: Hgb A1c MFr Bld: 7.9 % — ABNORMAL HIGH (ref 4.6–6.5)

## 2016-01-23 ENCOUNTER — Encounter: Payer: Self-pay | Admitting: Endocrinology

## 2016-01-23 ENCOUNTER — Ambulatory Visit (INDEPENDENT_AMBULATORY_CARE_PROVIDER_SITE_OTHER): Payer: BLUE CROSS/BLUE SHIELD | Admitting: Endocrinology

## 2016-01-23 VITALS — BP 118/80 | HR 103 | Ht 65.0 in | Wt 161.0 lb

## 2016-01-23 DIAGNOSIS — E1065 Type 1 diabetes mellitus with hyperglycemia: Secondary | ICD-10-CM | POA: Diagnosis not present

## 2016-01-23 NOTE — Patient Instructions (Addendum)
10 Levemir in am  More sugars before supper  Keep insulin at room temp  Check sugar before dancing

## 2016-01-23 NOTE — Progress Notes (Signed)
Patient ID: Christine Reed, female   DOB: 08-May-1985, 30 y.o.   MRN: [id number]           Reason for Appointment : Follow-up for Type 1 Diabetes  History of Present Illness          Diagnosis: Type 1 diabetes mellitus, date of diagnosis: 2000         Previous history:   She was initially started on NPH and Regular Insulin and subsequently was on Lantus and Humalog Usually has had poor control but records are not available for the last few years from her previous endocrinologist In April her Lantus was changed Novolin N presumably because of tendency to low sugars during the night  Recent history:   INSULIN regimen is: Novolog at meals carbohydrate coverage 1:10, correction factor 1 units per 50 mg over 150 Levemir 8-morning and 6 units in evening  A1c previously was 6.2 but now up to 7.9  Current management, blood sugar patterns and problems identified:    She has checked her blood sugars less often than before and at irregular times  Since her sugars were relatively low fasting on the last visit her evening Levemir was reduced  She now said that she is noncompliant with her NOVOLOG since she is keeping this refrigerated and forgets to take it  She did not know that she can keep it at room temperature  FASTING blood sugars are checked somewhat irregularly and are variable but mostly slightly high.  She is checking only a couple of readings in the afternoon but usually not at suppertime which is around 8-10 PM  Since she is forgetting to take her insulin at suppertime usually she will take correction doses of NovoLog at bedtime when blood sugars are usually quite high  Has had low sugars with over correcting a high reading of 435 once She does have an insulin pump available but she has not scheduled her training  Glucose monitoring:  is being done 3-5 times a day         Glucometer:  Contour Blood Glucose readings from meter review of download  Mean values apply above for  all meters except median for One Touch  PRE-MEAL Fasting Lunch Dinner Bedtime Overall  Glucose range: 67-217    170-539  39-539   Mean/median:     224    Hypoglycemia:  occurs As above Factors causing hyperglycemia: Excessive insulin, sometimes increased activity Symptoms of hypoglycemia:Weakness Treatment of hypoglycemia:As above          Self-care: The diet that the patient has been following is: Carbohydrate counting Usually eating cream of wheat at breakfast, mostly salad at lunch and dinner is usually at 8-10 PM  Snacks usually are apple or crackers          Exercise: dancing 3x weekly           Dietician consultation: Most recent: 5/17 in the hospital .         CDE consultation:?  Wt Readings from Last 3 Encounters:  01/23/16 161 lb (73 kg)  01/01/16 167 lb 8 oz (76 kg)  12/26/15 160 lb (72.6 kg)    Diabetes labs:  Lab Results  Component Value Date   HGBA1C 7.9 (H) 01/17/2016   HGBA1C 6.2 09/29/2015   HGBA1C 6.2 (H) 07/17/2015   Lab Results  Component Value Date   LDLCALC 106 (H) 01/17/2016   CREATININE 1.81 (H) 01/17/2016   Microalbumin ratio 5011 done in 2/17  No  results found for: [facility]     Medication List       Accurate as of 01/23/16  9:54 AM. Always use your most recent med list.          acetaminophen 500 MG tablet Commonly known as:  TYLENOL Take 500 mg by mouth every 6 (six) hours as needed for mild pain or headache.   amLODipine 10 MG tablet Commonly known as:  NORVASC TAKE ONE TABLET BY MOUTH ONCE DAILY   BAYER CONTOUR NEXT TEST test strip Generic drug:  glucose blood USE TEST STRIPS TO CHECK BLOOD SUGAR 6 TIMES DAILY.   diphenhydrAMINE 25 MG tablet Commonly known as:  BENADRYL Take 25 mg by mouth every 6 (six) hours as needed for itching or allergies.   famotidine 20 MG tablet Commonly known as:  PEPCID Take 1 tablet (20 mg total) by mouth 2 (two) times daily.   furosemide 20 MG tablet Commonly known as:  LASIX Take  20 mg by mouth daily.   insulin aspart 100 UNIT/ML injection Commonly known as:  novoLOG Inject 0-15 Units into the skin 3 (three) times daily with meals. Based on carb counting 10:1 sliding scale   Insulin Detemir 100 UNIT/ML Pen Commonly known as:  LEVEMIR Inject 4 units twice daily.   multivitamin-prenatal 27-0.8 MG Tabs tablet Take 1 tablet by mouth daily.       Allergies:  Allergies  Allergen Reactions  . Iodine Rash  . Nickel Rash    Past Medical History:  Diagnosis Date  . Anemia   . Chronic kidney disease   . Diabetes mellitus    diagnosed at age 89  . Diabetic retinopathy ([place])   . Hypertension     Past Surgical History:  Procedure Laterality Date  . CESAREAN SECTION N/A 10/19/2015   Procedure: CESAREAN SECTION;  Surgeon: Chancy Milroy, MD;  Location: [place];  Service: Obstetrics;  Laterality: N/A;  . lipo suction  2015  . TONSILLECTOMY    . TONSILLECTOMY AND ADENOIDECTOMY      Family History  Problem Relation Age of Onset  . Cancer Maternal Grandmother     colon and breast  . Diabetes Maternal Grandfather   . Diabetes Mother   . Diabetes Father   . Diabetes Paternal Grandmother   . Diabetes Paternal Grandfather     Social History:  reports that she has never smoked. She has never used smokeless tobacco. She reports that she does not drink alcohol or use drugs.      Review of Systems   Today she is asking about numbness in the fingers of her right hand. This is mostly in the first fourth fingers and initially was mostly on waking up but now is in most of the time. No pain or tingling. Has not discussed with PCP   She has renal insufficiency and nephrotic syndrome followed by nephrologist   LABS:  Lab on 01/17/2016  Component Date Value Ref Range Status  . Sodium 01/17/2016 136  135 - 145 mEq/L Final  . Potassium 01/17/2016 4.0  3.5 - 5.1 mEq/L Final  . Chloride 01/17/2016 109  96 - 112 mEq/L Final  . CO2 01/17/2016 24  19  - 32 mEq/L Final  . Glucose, Bld 01/17/2016 215* 70 - 99 mg/dL Final  . BUN 01/17/2016 14  6 - 23 mg/dL Final  . Creatinine, Ser 01/17/2016 1.81* 0.40 - 1.20 mg/dL Final  . Calcium 01/17/2016 8.0* 8.4 - 10.5 mg/dL Final  .  GFR 01/17/2016 42.72* >60.00 mL/min Final  . Cholesterol 01/17/2016 203* 0 - 200 mg/dL Final  . Triglycerides 01/17/2016 113.0  0.0 - 149.0 mg/dL Final  . HDL 01/17/2016 74.90  >39.00 mg/dL Final  . VLDL 01/17/2016 22.6  0.0 - 40.0 mg/dL Final  . LDL Cholesterol 01/17/2016 106* 0 - 99 mg/dL Final  . Total CHOL/HDL Ratio 01/17/2016 3   Final  . NonHDL 01/17/2016 128.57   Final  . Hgb A1c MFr Bld 01/17/2016 7.9* 4.6 - 6.5 % Final    Physical Examination:  BP 118/80   Pulse (!) 103   Ht 5\' 5"  (1.651 m)   Wt 161 lb (73 kg)   LMP 12/11/2015   SpO2 98%   BMI 26.79 kg/m   She has normal monofilament sensation on the right fingers Tinel's sign negative No atrophy of the thenar or finger muscles  ASSESSMENT:  Diabetes type 1 With labile blood sugars See history of present illness for detailed discussion of current diabetes management, blood sugar patterns and problems identified  Since her pregnancy her blood sugars have been Poorly controlled She is also erratic with her regimen of checking blood sugars and taking mealtime insulin She thinks that she forgets to take her mealtime insulin because she does not have it on her kitchen counter and is keeping the vial refrigerated Advised her not to take excessive doses of insulin at bedtime Needs to also check sugars before going for exercise To adjust her morning Levemir will have to have her check blood sugars before supper regularly also  She does have the [employer] insulin pump and needs to be started on this    PLAN:    Start checking blood sugars 4 times a day  She can keep her Novolog at room temperature so that she can remember to take her insulin with every meal especially suppertime  Increase  LEVEMIR to 10 units in the morning since she probably has higher readings later in the day  Avoid full correction doses at bedtime  She will be set up to see Vaughan Basta for pump training  Check blood sugars pre-exercise  Call if getting excessive low sugars  Wrist splint for probable right carpal tunnel  Patient Instructions  10 Levemir in am  More sugars before supper  Check sugar before dancing   Counseling time on subjects discussed above is over 50% of today's 25 minute visit   Brigetta Beckstrom 01/23/2016, 9:54 AM   Note: This note was prepared with Dragon voice recognition system technology. Any transcriptional errors that result from this process are unintentional.

## 2016-01-30 ENCOUNTER — Encounter: Payer: Self-pay | Admitting: Student

## 2016-01-30 ENCOUNTER — Ambulatory Visit (INDEPENDENT_AMBULATORY_CARE_PROVIDER_SITE_OTHER): Payer: BLUE CROSS/BLUE SHIELD | Admitting: Student

## 2016-01-30 VITALS — BP 121/81 | HR 90 | Wt 156.3 lb

## 2016-01-30 DIAGNOSIS — Z30431 Encounter for routine checking of intrauterine contraceptive device: Secondary | ICD-10-CM

## 2016-01-30 NOTE — Patient Instructions (Signed)

## 2016-01-30 NOTE — Progress Notes (Signed)
     GYNECOLOGY OFFICE PROGRESS NOTE  History:  30 y.o. G1P0100 here today for today for IUD string check; Liletta IUD was placed  01/01/16. Patient reports vaginal bleeding daily since insertion.   The following portions of the patient's history were reviewed and updated as appropriate: allergies, current medications, past family history, past medical history, past social history, past surgical history and problem list.   Review of Systems:  Pertinent items are noted in HPI.   Objective:  Physical Exam Blood pressure 121/81, pulse 90, weight 156 lb 4.8 oz (70.9 kg), last menstrual period 12/31/2015, not currently breastfeeding. CONSTITUTIONAL: Well-developed, well-nourished female in no acute distress.  HENT:  Normocephalic, atraumatic. External right and left ear normal. Oropharynx is clear and moist EYES: Conjunctivae and EOM are normal. Pupils are equal, round, and reactive to light. No scleral icterus.  NECK: Normal range of motion, supple, no masses CARDIOVASCULAR: Normal heart rate noted RESPIRATORY: Effort and breath sounds normal, no problems with respiration noted ABDOMEN: Soft, no distention noted.   PELVIC: Normal appearing external genitalia; normal appearing vaginal mucosa and cervix.  IUD strings visualized, about 3 cm in length outside cervix.   Assessment & Plan:  Normal IUD check. Discussed incidence of unscheduled bleeding for the first 3-6 months (as discussed prior to IUD insertion). Management of unscheduled bleeding complicated by pt's J6EG, renal disease, & hypertension. Patient agreeable to continuing with the IUD. Will call for follow up if symptoms worsen or she would like it removed.  Patient to keep IUD in place for 3 years; can come in for removal if she desires pregnancy within the next 3 years. Routine preventative health maintenance measures emphasized.  Jorje Guild, NP

## 2016-02-15 ENCOUNTER — Other Ambulatory Visit: Payer: Self-pay | Admitting: Family

## 2016-02-26 ENCOUNTER — Encounter: Payer: Self-pay | Admitting: Nutrition

## 2016-02-27 ENCOUNTER — Ambulatory Visit: Payer: Self-pay | Admitting: Endocrinology

## 2016-02-28 ENCOUNTER — Ambulatory Visit: Payer: Self-pay | Admitting: Endocrinology

## 2016-03-01 ENCOUNTER — Emergency Department (HOSPITAL_COMMUNITY): Payer: Medicaid Other

## 2016-03-01 ENCOUNTER — Ambulatory Visit (INDEPENDENT_AMBULATORY_CARE_PROVIDER_SITE_OTHER)
Admission: EM | Admit: 2016-03-01 | Discharge: 2016-03-01 | Disposition: A | Discharge status: Home / Self Care | Payer: Medicaid Other | Source: Home / Self Care | Attending: Family Medicine | Admitting: Family Medicine

## 2016-03-01 ENCOUNTER — Inpatient Hospital Stay (HOSPITAL_COMMUNITY)
Admission: EM | Admit: 2016-03-01 | Discharge: 2016-03-04 | DRG: 638 | Disposition: A | Discharge status: Home / Self Care | Payer: Medicaid Other | Attending: Family Medicine | Admitting: Family Medicine

## 2016-03-01 ENCOUNTER — Encounter (HOSPITAL_COMMUNITY): Payer: Self-pay | Admitting: *Deleted

## 2016-03-01 ENCOUNTER — Encounter (HOSPITAL_COMMUNITY): Payer: Self-pay

## 2016-03-01 DIAGNOSIS — Z800 Family history of malignant neoplasm of digestive organs: Secondary | ICD-10-CM

## 2016-03-01 DIAGNOSIS — I129 Hypertensive chronic kidney disease with stage 1 through stage 4 chronic kidney disease, or unspecified chronic kidney disease: Secondary | ICD-10-CM | POA: Diagnosis present

## 2016-03-01 DIAGNOSIS — E1021 Type 1 diabetes mellitus with diabetic nephropathy: Secondary | ICD-10-CM | POA: Diagnosis present

## 2016-03-01 DIAGNOSIS — R197 Diarrhea, unspecified: Secondary | ICD-10-CM | POA: Diagnosis not present

## 2016-03-01 DIAGNOSIS — D631 Anemia in chronic kidney disease: Secondary | ICD-10-CM | POA: Diagnosis present

## 2016-03-01 DIAGNOSIS — E1022 Type 1 diabetes mellitus with diabetic chronic kidney disease: Secondary | ICD-10-CM | POA: Diagnosis present

## 2016-03-01 DIAGNOSIS — D638 Anemia in other chronic diseases classified elsewhere: Secondary | ICD-10-CM | POA: Diagnosis present

## 2016-03-01 DIAGNOSIS — Z794 Long term (current) use of insulin: Secondary | ICD-10-CM | POA: Diagnosis not present

## 2016-03-01 DIAGNOSIS — E860 Dehydration: Secondary | ICD-10-CM

## 2016-03-01 DIAGNOSIS — N049 Nephrotic syndrome with unspecified morphologic changes: Secondary | ICD-10-CM

## 2016-03-01 DIAGNOSIS — J101 Influenza due to other identified influenza virus with other respiratory manifestations: Secondary | ICD-10-CM | POA: Diagnosis present

## 2016-03-01 DIAGNOSIS — Z79899 Other long term (current) drug therapy: Secondary | ICD-10-CM

## 2016-03-01 DIAGNOSIS — N179 Acute kidney failure, unspecified: Secondary | ICD-10-CM | POA: Diagnosis present

## 2016-03-01 DIAGNOSIS — Z803 Family history of malignant neoplasm of breast: Secondary | ICD-10-CM

## 2016-03-01 DIAGNOSIS — Z833 Family history of diabetes mellitus: Secondary | ICD-10-CM | POA: Diagnosis not present

## 2016-03-01 DIAGNOSIS — R6889 Other general symptoms and signs: Secondary | ICD-10-CM | POA: Diagnosis present

## 2016-03-01 DIAGNOSIS — E10319 Type 1 diabetes mellitus with unspecified diabetic retinopathy without macular edema: Secondary | ICD-10-CM | POA: Diagnosis present

## 2016-03-01 DIAGNOSIS — J111 Influenza due to unidentified influenza virus with other respiratory manifestations: Secondary | ICD-10-CM

## 2016-03-01 DIAGNOSIS — N183 Chronic kidney disease, stage 3 unspecified: Secondary | ICD-10-CM | POA: Diagnosis present

## 2016-03-01 DIAGNOSIS — R112 Nausea with vomiting, unspecified: Secondary | ICD-10-CM

## 2016-03-01 DIAGNOSIS — E1010 Type 1 diabetes mellitus with ketoacidosis without coma: Secondary | ICD-10-CM | POA: Diagnosis present

## 2016-03-01 DIAGNOSIS — N184 Chronic kidney disease, stage 4 (severe): Secondary | ICD-10-CM | POA: Diagnosis present

## 2016-03-01 LAB — I-STAT VENOUS BLOOD GAS, ED
Acid-base deficit: 12 mmol/L — ABNORMAL HIGH (ref 0.0–2.0)
BICARBONATE: 13.6 mmol/L — AB (ref 20.0–28.0)
O2 Saturation: 78 %
PH VEN: 7.274 (ref 7.250–7.430)
PO2 VEN: 47 mmHg — AB (ref 32.0–45.0)
TCO2: 14 mmol/L (ref 0–100)
pCO2, Ven: 29.4 mmHg — ABNORMAL LOW (ref 44.0–60.0)

## 2016-03-01 LAB — CBC WITH DIFFERENTIAL/PLATELET
Basophils Absolute: 0 10*3/uL (ref 0.0–0.1)
Basophils Relative: 0 %
Eosinophils Absolute: 0 10*3/uL (ref 0.0–0.7)
Eosinophils Relative: 0 %
HCT: 22.3 % — ABNORMAL LOW (ref 36.0–46.0)
Hemoglobin: 7.8 g/dL — ABNORMAL LOW (ref 12.0–15.0)
Lymphocytes Relative: 23 %
Lymphs Abs: 1.3 10*3/uL (ref 0.7–4.0)
MCH: 30.4 pg (ref 26.0–34.0)
MCHC: 35 g/dL (ref 30.0–36.0)
MCV: 86.8 fL (ref 78.0–100.0)
Monocytes Absolute: 0.4 10*3/uL (ref 0.1–1.0)
Monocytes Relative: 6 %
Neutro Abs: 3.9 10*3/uL (ref 1.7–7.7)
Neutrophils Relative %: 71 %
Platelets: 237 10*3/uL (ref 150–400)
RBC: 2.57 MIL/uL — ABNORMAL LOW (ref 3.87–5.11)
RDW: 13 % (ref 11.5–15.5)
WBC: 5.6 10*3/uL (ref 4.0–10.5)

## 2016-03-01 LAB — POCT I-STAT, CHEM 8
BUN: 31 mg/dL — ABNORMAL HIGH (ref 6–20)
CHLORIDE: 109 mmol/L (ref 101–111)
Calcium, Ion: 1.21 mmol/L (ref 1.15–1.40)
Creatinine, Ser: 3.3 mg/dL — ABNORMAL HIGH (ref 0.44–1.00)
Glucose, Bld: 504 mg/dL (ref 65–99)
HEMATOCRIT: 23 % — AB (ref 36.0–46.0)
Hemoglobin: 7.8 g/dL — ABNORMAL LOW (ref 12.0–15.0)
POTASSIUM: 5.2 mmol/L — AB (ref 3.5–5.1)
SODIUM: 136 mmol/L (ref 135–145)
TCO2: 19 mmol/L (ref 0–100)

## 2016-03-01 LAB — COMPREHENSIVE METABOLIC PANEL
ALBUMIN: 1.9 g/dL — AB (ref 3.5–5.0)
ALT: 25 U/L (ref 14–54)
ANION GAP: 9 (ref 5–15)
AST: 27 U/L (ref 15–41)
Alkaline Phosphatase: 63 U/L (ref 38–126)
BILIRUBIN TOTAL: 0.9 mg/dL (ref 0.3–1.2)
BUN: 38 mg/dL — ABNORMAL HIGH (ref 6–20)
CHLORIDE: 109 mmol/L (ref 101–111)
CO2: 17 mmol/L — AB (ref 22–32)
Calcium: 8.1 mg/dL — ABNORMAL LOW (ref 8.9–10.3)
Creatinine, Ser: 3.12 mg/dL — ABNORMAL HIGH (ref 0.44–1.00)
GFR calc Af Amer: 22 mL/min — ABNORMAL LOW (ref >60)
GFR calc non Af Amer: 19 mL/min — ABNORMAL LOW (ref >60)
GLUCOSE: 507 mg/dL — AB (ref 65–99)
POTASSIUM: 5.3 mmol/L — AB (ref 3.5–5.1)
SODIUM: 135 mmol/L (ref 135–145)
TOTAL PROTEIN: 5.1 g/dL — AB (ref 6.5–8.1)

## 2016-03-01 LAB — POCT URINALYSIS DIP (DEVICE)
Bilirubin Urine: NEGATIVE
Glucose, UA: 500 mg/dL — AB
Ketones, ur: 15 mg/dL — AB
Leukocytes, UA: NEGATIVE
Nitrite: NEGATIVE
Protein, ur: >=300 mg/dL — AB
Specific Gravity, Urine: >=1.03 (ref 1.005–1.030)
Urobilinogen, UA: 0.2 mg/dL (ref 0.0–1.0)
pH: 6 (ref 5.0–8.0)

## 2016-03-01 LAB — RAPID URINE DRUG SCREEN, HOSP PERFORMED
AMPHETAMINES: NOT DETECTED
Barbiturates: NOT DETECTED
Benzodiazepines: NOT DETECTED
Cocaine: NOT DETECTED
Opiates: NOT DETECTED
TETRAHYDROCANNABINOL: NOT DETECTED

## 2016-03-01 LAB — BASIC METABOLIC PANEL
ANION GAP: 7 (ref 5–15)
BUN: 31 mg/dL — ABNORMAL HIGH (ref 6–20)
CALCIUM: 7.9 mg/dL — AB (ref 8.9–10.3)
CO2: 16 mmol/L — AB (ref 22–32)
Chloride: 113 mmol/L — ABNORMAL HIGH (ref 101–111)
Creatinine, Ser: 2.81 mg/dL — ABNORMAL HIGH (ref 0.44–1.00)
GFR calc non Af Amer: 22 mL/min — ABNORMAL LOW (ref >60)
GFR, EST AFRICAN AMERICAN: 25 mL/min — AB (ref >60)
Glucose, Bld: 205 mg/dL — ABNORMAL HIGH (ref 65–99)
POTASSIUM: 4 mmol/L (ref 3.5–5.1)
Sodium: 136 mmol/L (ref 135–145)

## 2016-03-01 LAB — CBG MONITORING, ED
GLUCOSE-CAPILLARY: 504 mg/dL — AB (ref 65–99)
GLUCOSE-CAPILLARY: 269 mg/dL — AB (ref 65–99)
Glucose-Capillary: 360 mg/dL — ABNORMAL HIGH (ref 65–99)
Glucose-Capillary: 228 mg/dL — ABNORMAL HIGH (ref 65–99)

## 2016-03-01 LAB — HCG, SERUM, QUALITATIVE: Preg, Serum: NEGATIVE

## 2016-03-01 LAB — INFLUENZA PANEL BY PCR (TYPE A & B)
Influenza A By PCR: POSITIVE — AB
Influenza B By PCR: NEGATIVE

## 2016-03-01 LAB — GLUCOSE, CAPILLARY
GLUCOSE-CAPILLARY: 140 mg/dL — AB (ref 65–99)
Glucose-Capillary: 186 mg/dL — ABNORMAL HIGH (ref 65–99)

## 2016-03-01 LAB — POCT PREGNANCY, URINE: Preg Test, Ur: NEGATIVE

## 2016-03-01 LAB — PREGNANCY, URINE: Preg Test, Ur: NEGATIVE

## 2016-03-01 LAB — RAPID STREP SCREEN (MED CTR MEBANE ONLY): Streptococcus, Group A Screen (Direct): NEGATIVE

## 2016-03-01 LAB — LIPASE, BLOOD: Lipase: 10 U/L — ABNORMAL LOW (ref 11–51)

## 2016-03-01 MED ORDER — SODIUM CHLORIDE 0.9 % IV SOLN
INTRAVENOUS | Status: DC
  Administered 2016-03-01: 22:00:00 via INTRAVENOUS

## 2016-03-01 MED ORDER — SODIUM CHLORIDE 0.9 % IV SOLN
INTRAVENOUS | Status: DC
  Administered 2016-03-01: 4.4 [IU]/h via INTRAVENOUS
  Filled 2016-03-01: qty 2.5

## 2016-03-01 MED ORDER — DEXTROSE-NACL 5-0.45 % IV SOLN
INTRAVENOUS | Status: DC
  Administered 2016-03-01 – 2016-03-02 (×2): via INTRAVENOUS

## 2016-03-01 MED ORDER — ONDANSETRON 4 MG PO TBDP
ORAL_TABLET | ORAL | Status: AC
  Filled 2016-03-01: qty 1

## 2016-03-01 MED ORDER — SODIUM CHLORIDE 0.9 % IV SOLN
INTRAVENOUS | Status: DC
  Administered 2016-03-01: 21:00:00 via INTRAVENOUS

## 2016-03-01 MED ORDER — OSELTAMIVIR PHOSPHATE 30 MG PO CAPS
30.0000 mg | ORAL_CAPSULE | Freq: Once | ORAL | Status: AC
  Administered 2016-03-01: 30 mg via ORAL
  Filled 2016-03-01 (×2): qty 1

## 2016-03-01 MED ORDER — HYDRALAZINE HCL 20 MG/ML IJ SOLN: 10.0000 mg | INTRAMUSCULAR | Status: DC | PRN

## 2016-03-01 MED ORDER — ONDANSETRON HCL 4 MG/2ML IJ SOLN
4.0000 mg | Freq: Once | INTRAMUSCULAR | Status: AC
  Administered 2016-03-01: 4 mg via INTRAMUSCULAR

## 2016-03-01 MED ORDER — HEPARIN SODIUM (PORCINE) 5000 UNIT/ML IJ SOLN
5000.0000 [IU] | Freq: Three times a day (TID) | INTRAMUSCULAR | Status: DC
  Administered 2016-03-01 – 2016-03-02 (×4): 5000 [IU] via SUBCUTANEOUS
  Filled 2016-03-01 (×6): qty 1

## 2016-03-01 MED ORDER — INSULIN REGULAR HUMAN 100 UNIT/ML IJ SOLN
INTRAMUSCULAR | Status: DC
  Filled 2016-03-01: qty 2.5

## 2016-03-01 MED ORDER — OSELTAMIVIR PHOSPHATE 30 MG PO CAPS
30.0000 mg | ORAL_CAPSULE | Freq: Every day | ORAL | Status: DC
Start: 2016-03-02 — End: 2016-03-04
  Administered 2016-03-02 – 2016-03-04 (×3): 30 mg via ORAL
  Filled 2016-03-01 (×4): qty 1

## 2016-03-01 MED ORDER — SODIUM CHLORIDE 0.9 % IV BOLUS (SEPSIS)
1000.0000 mL | Freq: Once | INTRAVENOUS | Status: AC
  Administered 2016-03-01: 1000 mL via INTRAVENOUS

## 2016-03-01 MED ORDER — ONDANSETRON HCL 4 MG/2ML IJ SOLN
INTRAMUSCULAR | Status: AC
  Filled 2016-03-01: qty 2

## 2016-03-01 MED ORDER — DEXTROSE-NACL 5-0.45 % IV SOLN
INTRAVENOUS | Status: DC
  Administered 2016-03-01: 21:00:00 via INTRAVENOUS

## 2016-03-01 NOTE — ED Provider Notes (Signed)
[facility] DEPT Provider Note   CSN: [id number] Arrival date & time: 03/01/16  1519     History   Chief Complaint Chief Complaint  Patient presents with  . Hyperglycemia  . URI  . Diarrhea    HPI ZYARA RILING is a 31 y.o. female.  HPI Patient presents with nausea vomiting diarrhea and hyperglycemia. Seen at urgent care and sent in for likely DKA. Sugar 500. Creatinine up from around 1.5-3.3. Has had some fevers. Has had cough and sore throat. Mild upper abdominal pain. Decreased appetite. Family members have had the nausea vomiting diarrhea 2. She's had very little appetite. Past Medical History:  Diagnosis Date  . Anemia   . Chronic kidney disease   . Diabetes mellitus    diagnosed at age 48  . Diabetic retinopathy ([place])   . Hypertension     Patient Active Problem List   Diagnosis Date Noted  . Nephrotic syndrome 10/17/2015  . Ascites 10/17/2015  . Symptomatic anemia 09/11/2015  . Anemia affecting pregnancy in second trimester, antepartum 08/12/2015  . Diabetic nephropathy ([place]) 08/07/2015  . CKD (chronic kidney disease) stage 3, GFR 30-59 ml/min 08/07/2015  . Diabetic retinopathy ([place]) 07/24/2015  . Chronic hypertension during pregnancy, antepartum 07/22/2015  . Anemia, chronic disease 07/17/2015  . T1DM - Type F 06/16/2015  . Supervision of high risk pregnancy, antepartum 06/16/2015    Past Surgical History:  Procedure Laterality Date  . CESAREAN SECTION N/A 10/19/2015   Procedure: CESAREAN SECTION;  Surgeon: Chancy Milroy, MD;  Location: [place];  Service: Obstetrics;  Laterality: N/A;  . lipo suction  2015  . TONSILLECTOMY    . TONSILLECTOMY AND ADENOIDECTOMY      OB History    Gravida Para Term Preterm AB Living   1 1   1        SAB TAB Ectopic Multiple Live Births         0         Home Medications    Prior to Admission medications   Medication Sig Start Date End Date Taking? Authorizing Provider  acetaminophen (TYLENOL) 500  MG tablet Take 500 mg by mouth every 6 (six) hours as needed for mild pain or headache.   Yes Historical Provider, MD  amLODipine (NORVASC) 10 MG tablet TAKE ONE TABLET BY MOUTH ONCE DAILY 02/16/16  Yes Gwen Pounds, CNM  benazepril (LOTENSIN) 10 MG tablet Take 10 mg by mouth daily.  01/16/16  Yes Historical Provider, MD  Dextromethorphan-Guaifenesin (TUSSIN DM PO) Take 10 mLs by mouth every 6 (six) hours as needed (cough/ cold symptoms).   Yes Historical Provider, MD  diphenhydrAMINE (BENADRYL) 25 MG tablet Take 25 mg by mouth every 6 (six) hours as needed for itching or allergies.    Yes Historical Provider, MD  furosemide (LASIX) 20 MG tablet Take 20 mg by mouth daily.   Yes Historical Provider, MD  Guaifenesin (MUCINEX MAXIMUM STRENGTH) 1200 MG TB12 Take 1,200 mg by mouth 2 (two) times daily as needed (cough).   Yes Historical Provider, MD  insulin aspart (NOVOLOG) 100 UNIT/ML injection Inject 0-15 Units into the skin 3 (three) times daily with meals. Based on carb counting 10:1 sliding scale 10/22/15  Yes Aletha Halim, MD  Insulin Detemir (LEVEMIR) 100 UNIT/ML Pen Inject 4 units twice daily. Patient taking differently: Inject 8-10 Units into the skin See admin instructions. Inject 10 units subcutaneously ever morning and 8 units at bedtime 10/22/15  Yes Aletha Halim, MD  BAYER CONTOUR NEXT TEST test strip USE TEST STRIPS TO CHECK BLOOD SUGAR 6 TIMES DAILY. 12/15/15   Elayne Snare, MD  famotidine (PEPCID) 20 MG tablet Take 1 tablet (20 mg total) by mouth 2 (two) times daily. Patient not taking: Reported on 03/01/2016 06/28/15   Vanessa Kick, MD    Family History Family History  Problem Relation Age of Onset  . Cancer Maternal Grandmother     colon and breast  . Diabetes Maternal Grandfather   . Diabetes Mother   . Diabetes Father   . Diabetes Paternal Grandmother   . Diabetes Paternal Grandfather     Social History Social History  Substance Use Topics  . Smoking status: Never Smoker    . Smokeless tobacco: Never Used  . Alcohol use No     Allergies   Iodine and Nickel   Review of Systems Review of Systems  Constitutional: Positive for appetite change.  HENT: Positive for congestion and sore throat.   Eyes: Negative for visual disturbance.  Respiratory: Positive for cough.   Gastrointestinal: Positive for diarrhea, nausea and vomiting.  Endocrine: Positive for polyuria.  Genitourinary: Negative for flank pain.  Musculoskeletal: Negative for back pain.  Skin: Negative for rash.  Neurological: Positive for weakness. Negative for numbness.  Psychiatric/Behavioral: Negative for confusion.     Physical Exam Updated Vital Signs BP 129/90   Pulse 101   Temp 98.9 F (37.2 C) (Oral)   Resp 16   Ht 5\' 5"  (1.651 m)   Wt 146 lb (66.2 kg)   LMP 02/18/2016 (Exact Date)   SpO2 100%   BMI 24.30 kg/m   Physical Exam  Constitutional: She appears well-developed.  HENT:  Head: Normocephalic.  Mouth/Throat: No oropharyngeal exudate.  Mild posterior pharyngeal erythema without exudate.  Neck: Neck supple.  Cardiovascular:  Mild tachycardia  Pulmonary/Chest: Effort normal.  Abdominal: Soft. There is no tenderness.  Musculoskeletal: She exhibits no edema.  Neurological: She is alert.  Skin: Skin is warm.  Psychiatric: She has a normal mood and affect.     ED Treatments / Results  Labs (all labs ordered are listed, but only abnormal results are displayed) Labs Reviewed  CBC WITH DIFFERENTIAL/PLATELET - Abnormal; Notable for the following:       Result Value   RBC 2.57 (*)    Hemoglobin 7.8 (*)    HCT 22.3 (*)    All other components within normal limits  COMPREHENSIVE METABOLIC PANEL - Abnormal; Notable for the following:    Potassium 5.3 (*)    CO2 17 (*)    Glucose, Bld 507 (*)    BUN 38 (*)    Creatinine, Ser 3.12 (*)    Calcium 8.1 (*)    Total Protein 5.1 (*)    Albumin 1.9 (*)    GFR calc non Af Amer 19 (*)    GFR calc Af Amer 22 (*)     All other components within normal limits  INFLUENZA PANEL BY PCR (TYPE A & B) - Abnormal; Notable for the following:    Influenza A By PCR POSITIVE (*)    All other components within normal limits  CBG MONITORING, ED - Abnormal; Notable for the following:    Glucose-Capillary 504 (*)    All other components within normal limits  I-STAT VENOUS BLOOD GAS, ED - Abnormal; Notable for the following:    pCO2, Ven 29.4 (*)    pO2, Ven 47.0 (*)    Bicarbonate 13.6 (*)  Acid-base deficit 12.0 (*)    All other components within normal limits  CBG MONITORING, ED - Abnormal; Notable for the following:    Glucose-Capillary 360 (*)    All other components within normal limits  RAPID STREP SCREEN (NOT AT [facility])  CULTURE, GROUP A STREP ([place])  CULTURE, GROUP A STREP [facility])  BLOOD GAS, VENOUS  LIPASE, BLOOD    EKG  EKG Interpretation None       Radiology Dg Chest 2 View  Result Date: 03/01/2016 CLINICAL DATA:  X 3 days patient reports cough, fever, diarrhea, vomiting. EXAM: CHEST  2 VIEW COMPARISON:  None. FINDINGS: Normal mediastinum and cardiac silhouette. Normal pulmonary vasculature. No evidence of effusion, infiltrate, or pneumothorax. No acute bony abnormality. IMPRESSION: No acute cardiopulmonary process. Electronically Signed   By: Suzy Bouchard M.D.   On: 03/01/2016 18:31    Procedures Procedures (including critical care time)  Medications Ordered in ED Medications  dextrose 5 %-0.45 % sodium chloride infusion (not administered)  insulin regular (NOVOLIN R,HUMULIN R) 250 Units in sodium chloride 0.9 % 250 mL (1 Units/mL) infusion (3 Units/hr Intravenous Rate/Dose Change 03/01/16 1848)  sodium chloride 0.9 % bolus 1,000 mL (0 mLs Intravenous Stopped 03/01/16 1845)    And  sodium chloride 0.9 % bolus 1,000 mL (1,000 mLs Intravenous New Bag/Given 03/01/16 1853)    And  0.9 %  sodium chloride infusion (not administered)  oseltamivir (TAMIFLU) capsule 30 mg (not administered)      Initial Impression / Assessment and Plan / ED Course  I have reviewed the triage vital signs and the nursing notes.  Pertinent labs & imaging results that were available during my care of the patient were reviewed by me and considered in my medical decision making (see chart for details).     Patient sent from urgent care. Nausea vomiting and diarrhea. Sugars high. Has had fevers. Found to be in a mild DKA. Bicarbonate 13 with normal pH. Also has influenza. Creatinine increased to 3.3 from a baseline of around 1.6. Will admit to internal medicine.  CRITICAL CARE Performed by: Mackie Pai Total critical care time: 30 minutes Critical care time was exclusive of separately billable procedures and treating other patients. Critical care was necessary to treat or prevent imminent or life-threatening deterioration. Critical care was time spent personally by me on the following activities: development of treatment plan with patient and/or surrogate as well as nursing, discussions with consultants, evaluation of patient's response to treatment, examination of patient, obtaining history from patient or surrogate, ordering and performing treatments and interventions, ordering and review of laboratory studies, ordering and review of radiographic studies, pulse oximetry and re-evaluation of patient's condition.   Final Clinical Impressions(s) / ED Diagnoses   Final diagnoses:  Influenza  Type 1 diabetes mellitus with ketoacidosis without coma ([place])  Acute kidney injury [facility])    New Prescriptions New Prescriptions   No medications on file     Davonna Belling, MD 03/01/16 [place]

## 2016-03-01 NOTE — Discharge Instructions (Addendum)
While here in clinic, a UA, Ua-pregnancy test, I-stat were obtained. You have also been given an injection of Zofran for nausea. Your laboratory values are abnormal and I recommend you go to the Emergency Room for further treatment and evaluation. Your Potassium was 5.2, BUN 31, SrCR 3.3, Glucose 504, and Hgb and Hct were 7.8 and 23 respectively. Please go to the ER for further treatment.

## 2016-03-01 NOTE — ED Notes (Signed)
Patient transported to X-ray 

## 2016-03-01 NOTE — ED Triage Notes (Signed)
Pt sent here from UC for poss ketoacidosis.  Type I diabetic, cbg of 504.  Initially was seen at [facility] for diarrhea, vomiting and upper respiratory s/s x 2 days.

## 2016-03-01 NOTE — H&P (Signed)
History and Physical    Christine Reed UXL:[id number] DOB: [birth year] DOA: 03/01/2016  PCP: Reginia Forts, MD  Patient coming from: Home.  Chief Complaint: Nausea vomiting and sore throat.  HPI: Christine Reed is a 31 y.o. female with history of diabetes mellitus type 1, hypertension, chronic kidney disease stage 3-4 was referred to the ER from urgent care center after patient was found to be having markedly elevated blood sugar. Patient states over the last 2 days patient has been having sore throat with nausea and vomiting. Had one episode of diarrhea. Has been having some lower abdominal discomfort. In the ER patient is found to have blood sugar more than 500 with VBG showing pH of 7.2. Patient was started on IV fluid bolus and IV insulin infusion for DKA. Patient's influenza PCR turned out to be positive for influenza A and was started on Tamiflu. Patient is being admitted for DKA and influenza.   ED Course: IV insulin infusion was started along with fluids for DKA. Tamiflu for influenza A.  Review of Systems: As per HPI, rest all negative.   Past Medical History:  Diagnosis Date  . Anemia   . Chronic kidney disease   . Diabetes mellitus    diagnosed at age 67  . Diabetic retinopathy ([place])   . Hypertension     Past Surgical History:  Procedure Laterality Date  . CESAREAN SECTION N/A 10/19/2015   Procedure: CESAREAN SECTION;  Surgeon: Chancy Milroy, MD;  Location: [place];  Service: Obstetrics;  Laterality: N/A;  . lipo suction  2015  . TONSILLECTOMY    . TONSILLECTOMY AND ADENOIDECTOMY       reports that she has never smoked. She has never used smokeless tobacco. She reports that she does not drink alcohol or use drugs.  Allergies  Allergen Reactions  . Iodine Rash  . Nickel Rash    Family History  Problem Relation Age of Onset  . Cancer Maternal Grandmother     colon and breast  . Diabetes Maternal Grandfather   . Diabetes Mother   . Diabetes Father     . Diabetes Paternal Grandmother   . Diabetes Paternal Grandfather     Prior to Admission medications   Medication Sig Start Date End Date Taking? Authorizing Provider  acetaminophen (TYLENOL) 500 MG tablet Take 500 mg by mouth every 6 (six) hours as needed for mild pain or headache.   Yes Historical Provider, MD  amLODipine (NORVASC) 10 MG tablet TAKE ONE TABLET BY MOUTH ONCE DAILY 02/16/16  Yes Gwen Pounds, CNM  benazepril (LOTENSIN) 10 MG tablet Take 10 mg by mouth daily.  01/16/16  Yes Historical Provider, MD  Dextromethorphan-Guaifenesin (TUSSIN DM PO) Take 10 mLs by mouth every 6 (six) hours as needed (cough/ cold symptoms).   Yes Historical Provider, MD  diphenhydrAMINE (BENADRYL) 25 MG tablet Take 25 mg by mouth every 6 (six) hours as needed for itching or allergies.    Yes Historical Provider, MD  furosemide (LASIX) 20 MG tablet Take 20 mg by mouth daily.   Yes Historical Provider, MD  Guaifenesin (MUCINEX MAXIMUM STRENGTH) 1200 MG TB12 Take 1,200 mg by mouth 2 (two) times daily as needed (cough).   Yes Historical Provider, MD  insulin aspart (NOVOLOG) 100 UNIT/ML injection Inject 0-15 Units into the skin 3 (three) times daily with meals. Based on carb counting 10:1 sliding scale 10/22/15  Yes Aletha Halim, MD  Insulin Detemir (LEVEMIR) 100 UNIT/ML Pen Inject 4 units  twice daily. Patient taking differently: Inject 8-10 Units into the skin See admin instructions. Inject 10 units subcutaneously ever morning and 8 units at bedtime 10/22/15  Yes Aletha Halim, MD  BAYER CONTOUR NEXT TEST test strip USE TEST STRIPS TO CHECK BLOOD SUGAR 6 TIMES DAILY. 12/15/15   Elayne Snare, MD  famotidine (PEPCID) 20 MG tablet Take 1 tablet (20 mg total) by mouth 2 (two) times daily. Patient not taking: Reported on 03/01/2016 06/28/15   Vanessa Kick, MD    Physical Exam: Vitals:   03/01/16 2030 03/01/16 2045 03/01/16 2100 03/01/16 2115  BP: 129/79 133/79 126/76 126/79  Pulse: 98 96 96 97  Resp:       Temp:      TempSrc:      SpO2: 100% 100% 100% 100%  Weight:      Height:          Constitutional: Moderately built and nourished. Vitals:   03/01/16 2030 03/01/16 2045 03/01/16 2100 03/01/16 2115  BP: 129/79 133/79 126/76 126/79  Pulse: 98 96 96 97  Resp:      Temp:      TempSrc:      SpO2: 100% 100% 100% 100%  Weight:      Height:       Eyes: Anicteric. No pallor. ENMT: No discharge from the ears eyes nose and mouth. Neck: No mass felt. No JVD appreciated. Respiratory: No rhonchi or crepitations. Cardiovascular: S1-S2 heard no murmurs appreciated. Abdomen: Soft nontender bowel sounds present. No guarding or rigidity. Musculoskeletal: No edema. No joint effusion. Skin: No rash. Skin appears warm. Neurologic: Alert awake oriented to time place and person. Moves all extremities. Psychiatric: Appears normal. Normal affect.   Labs on Admission: I have personally reviewed following labs and imaging studies  CBC:  Recent Labs Lab 03/01/16 1452 03/01/16 1804  WBC  --  5.6  NEUTROABS  --  3.9  HGB 7.8* 7.8*  HCT 23.0* 22.3*  MCV  --  86.8  PLT  --  101   Basic Metabolic Panel:  Recent Labs Lab 03/01/16 1452 03/01/16 1804  NA 136 135  K 5.2* 5.3*  CL 109 109  CO2  --  17*  GLUCOSE 504* 507*  BUN 31* 38*  CREATININE 3.30* 3.12*  CALCIUM  --  8.1*   GFR: Estimated Creatinine Clearance: 24.2 mL/min (by C-G formula based on SCr of 3.12 mg/dL (H)). Liver Function Tests:  Recent Labs Lab 03/01/16 1804  AST 27  ALT 25  ALKPHOS 63  BILITOT 0.9  PROT 5.1*  ALBUMIN 1.9*    Recent Labs Lab 03/01/16 1804  LIPASE 10*   No results for input(s): AMMONIA in the last 168 hours. Coagulation Profile: No results for input(s): INR, PROTIME in the last 168 hours. Cardiac Enzymes: No results for input(s): CKTOTAL, CKMB, CKMBINDEX, TROPONINI in the last 168 hours. BNP (last 3 results) No results for input(s): PROBNP in the last 8760 hours. HbA1C: No results  for input(s): HGBA1C in the last 72 hours. CBG:  Recent Labs Lab 03/01/16 1724 03/01/16 1841 03/01/16 1948 03/01/16 2108  GLUCAP 504* 360* 269* 228*   Lipid Profile: No results for input(s): CHOL, HDL, LDLCALC, TRIG, CHOLHDL, LDLDIRECT in the last 72 hours. Thyroid Function Tests: No results for input(s): TSH, T4TOTAL, FREET4, T3FREE, THYROIDAB in the last 72 hours. Anemia Panel: No results for input(s): VITAMINB12, FOLATE, FERRITIN, TIBC, IRON, RETICCTPCT in the last 72 hours. Urine analysis:    Component Value Date/Time  COLORURINE YELLOW 10/27/2015 1227   APPEARANCEUR HAZY (A) 10/27/2015 1227   LABSPEC >=1.030 03/01/2016 1421   PHURINE 6.0 03/01/2016 1421   GLUCOSEU 500 (A) 03/01/2016 1421   HGBUR MODERATE (A) 03/01/2016 1421   BILIRUBINUR NEGATIVE 03/01/2016 1421   KETONESUR 15 (A) 03/01/2016 1421   PROTEINUR >=300 (A) 03/01/2016 1421   UROBILINOGEN 0.2 03/01/2016 1421   NITRITE NEGATIVE 03/01/2016 1421   LEUKOCYTESUR NEGATIVE 03/01/2016 1421   Sepsis Labs: @LABRCNTIP (procalcitonin:4,lacticidven:4) ) Recent Results (from the past 240 hour(s))  Rapid strep screen (not at [facility])     Status: None   Collection Time: 03/01/16  5:17 PM  Result Value Ref Range Status   Streptococcus, Group A Screen (Direct) NEGATIVE NEGATIVE Final    Comment: (NOTE) A Rapid Antigen test may result negative if the antigen level in the sample is below the detection level of this test. The FDA has not cleared this test as a stand-alone test therefore the rapid antigen negative result has reflexed to a Group A Strep culture.      Radiological Exams on Admission: Dg Chest 2 View  Result Date: 03/01/2016 CLINICAL DATA:  X 3 days patient reports cough, fever, diarrhea, vomiting. EXAM: CHEST  2 VIEW COMPARISON:  None. FINDINGS: Normal mediastinum and cardiac silhouette. Normal pulmonary vasculature. No evidence of effusion, infiltrate, or pneumothorax. No acute bony abnormality. IMPRESSION:  No acute cardiopulmonary process. Electronically Signed   By: Suzy Bouchard M.D.   On: 03/01/2016 18:31    Assessment/Plan Principal Problem:   Type 1 diabetes mellitus with ketoacidosis without coma (HCC) Active Problems:   Anemia, chronic disease   CKD (chronic kidney disease) stage 3, GFR 30-59 ml/min   Nephrotic syndrome   DKA, type 1 (HCC)   Nausea & vomiting   Influenza A    1. DKA in diabetes mellitus type 1 - probably precipitated by influenza. Patient's anion gap is not markedly elevated but given the signs and symptoms and elevated blood sugar patient probably is developing early DKA for which patient is started on IV fluids as bolus and infusion and IV insulin infusion. Closely follow metabolic panel.  2. Influenza A+ probably causing nausea and vomiting and sore throat - I have discussed with pharmacy to dose Tamiflu given patient's chronic kidney disease. 3. Hypertension - since patient is nothing by mouth I have placed patient on when necessary IV hydralazine. 4. 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 Patient also takes Lasix which needs to be restarted once clinically appropriate. 5. Chronic anemia probably from chronic kidney disease - follow CBC.  Urine pregnancy is pending.   DVT prophylaxis: Heparin. Code Status: Full code.  Family Communication: Discussed with patient.  Disposition Plan: Home.  Consults called: None.  Admission status: Inpatient.    Rise Patience MD [facility] Pager [phone].  If 7PM-7AM, please contact night-coverage [email or web address] Password [facility]  03/01/2016, 9:51 PM

## 2016-03-01 NOTE — ED Provider Notes (Signed)
CSN: [id number]     Arrival date & time 03/01/16  1120 History   First MD Initiated Contact with Patient 03/01/16 1356     Chief Complaint  Patient presents with  . Vomiting   (Consider location/radiation/quality/duration/timing/severity/associated sxs/prior Treatment) 31 year old female presents to clinic with 3 day history of nausea, vomiting, and diarrhea, with abdominal pain along with cough. She reports she has been vomiting 5-6 times a day and had multiple episodes of diarrhea on the first two days but not today. She has not been able to eat but has had some water. She is also type 1 diabetic and reports her blood sugars have been elevated in th 400 mg/dl range.    The history is provided by the patient.    Past Medical History:  Diagnosis Date  . Anemia   . Chronic kidney disease   . Diabetes mellitus    diagnosed at age 89  . Diabetic retinopathy ([place])   . Hypertension    Past Surgical History:  Procedure Laterality Date  . CESAREAN SECTION N/A 10/19/2015   Procedure: CESAREAN SECTION;  Surgeon: Chancy Milroy, MD;  Location: [place];  Service: Obstetrics;  Laterality: N/A;  . lipo suction  2015  . TONSILLECTOMY    . TONSILLECTOMY AND ADENOIDECTOMY     Family History  Problem Relation Age of Onset  . Cancer Maternal Grandmother     colon and breast  . Diabetes Maternal Grandfather   . Diabetes Mother   . Diabetes Father   . Diabetes Paternal Grandmother   . Diabetes Paternal Grandfather    Social History  Substance Use Topics  . Smoking status: Never Smoker  . Smokeless tobacco: Never Used  . Alcohol use No   OB History    Gravida Para Term Preterm AB Living   1 1   1        SAB TAB Ectopic Multiple Live Births         0       Review of Systems  Constitutional: Positive for activity change, appetite change, chills, fatigue and fever.  HENT: Positive for congestion and rhinorrhea. Negative for sinus pressure and sore throat.   Respiratory:  Positive for cough. Negative for shortness of breath and wheezing.   Cardiovascular: Negative for chest pain.  Gastrointestinal: Positive for abdominal pain, diarrhea, nausea and vomiting.  Genitourinary: Negative.   Musculoskeletal: Positive for arthralgias and myalgias.  Neurological: Positive for dizziness and weakness. Negative for syncope, speech difficulty and numbness.    Allergies  Iodine and Nickel  Home Medications   Prior to Admission medications   Medication Sig Start Date End Date Taking? Authorizing Provider  acetaminophen (TYLENOL) 500 MG tablet Take 500 mg by mouth every 6 (six) hours as needed for mild pain or headache.   Yes Historical Provider, MD  amLODipine (NORVASC) 10 MG tablet TAKE ONE TABLET BY MOUTH ONCE DAILY 02/16/16  Yes Gwen Pounds, CNM  BAYER CONTOUR NEXT TEST test strip USE TEST STRIPS TO CHECK BLOOD SUGAR 6 TIMES DAILY. 12/15/15  Yes Elayne Snare, MD  benazepril (LOTENSIN) 10 MG tablet Take 1 tablet by mouth daily. 01/16/16  Yes Historical Provider, MD  diphenhydrAMINE (BENADRYL) 25 MG tablet Take 25 mg by mouth every 6 (six) hours as needed for itching or allergies.    Yes Historical Provider, MD  famotidine (PEPCID) 20 MG tablet Take 1 tablet (20 mg total) by mouth 2 (two) times daily. 06/28/15  Yes Vanessa Kick,  MD  furosemide (LASIX) 20 MG tablet Take 20 mg by mouth daily.   Yes Historical Provider, MD  insulin aspart (NOVOLOG) 100 UNIT/ML injection Inject 0-15 Units into the skin 3 (three) times daily with meals. Based on carb counting 10:1 sliding scale 10/22/15  Yes Aletha Halim, MD  Insulin Detemir (LEVEMIR) 100 UNIT/ML Pen Inject 4 units twice daily. Patient taking differently: 6-8 Units. Inject 8 units twice daily. 10/22/15  Yes Aletha Halim, MD  Prenatal Vit-Fe Fumarate-FA (MULTIVITAMIN-PRENATAL) 27-0.8 MG TABS tablet Take 1 tablet by mouth daily.    Yes Historical Provider, MD   Meds Ordered and Administered this Visit   Medications   ondansetron (ZOFRAN) injection 4 mg (4 mg Intramuscular Given 03/01/16 1412)    BP 138/85 (BP Location: Left Arm)   Pulse 112   Temp 100.7 F (38.2 C) (Oral)   Resp 16   LMP 02/18/2016 (Exact Date)   SpO2 98%   Breastfeeding? No  No data found.   Physical Exam  Constitutional: She is oriented to person, place, and time. She appears well-developed and well-nourished. She has a sickly appearance. She appears ill. She appears distressed.  HENT:  Head: Normocephalic.  Right Ear: External ear normal.  Left Ear: External ear normal.  Nose: Nose normal.  Mouth/Throat: Oropharynx is clear and moist.  Eyes: Pupils are equal, round, and reactive to light.  Cardiovascular: Regular rhythm.  Tachycardia present.   Pulmonary/Chest: Effort normal and breath sounds normal. No respiratory distress. She has no wheezes.  Abdominal: Soft. Bowel sounds are decreased. There is tenderness in the epigastric area. There is no rigidity, no rebound, no guarding, no CVA tenderness, no tenderness at McBurney's point and negative Murphy's sign.  Neurological: She is alert and oriented to person, place, and time.  Skin: Skin is warm and dry. Capillary refill takes less than 2 seconds. She is not diaphoretic. No erythema. No pallor.  Psychiatric: She has a normal mood and affect.  Nursing note and vitals reviewed.   Urgent Care Course     Procedures (including critical care time)  Labs Review Labs Reviewed  POCT URINALYSIS DIP (DEVICE) - Abnormal; Notable for the following:       Result Value   Glucose, UA 500 (*)    Ketones, ur 15 (*)    Hgb urine dipstick MODERATE (*)    Protein, ur >=300 (*)    All other components within normal limits  POCT I-STAT, CHEM 8 - Abnormal; Notable for the following:    Potassium 5.2 (*)    BUN 31 (*)    Creatinine, Ser 3.30 (*)    Glucose, Bld 504 (*)    Hemoglobin 7.8 (*)    HCT 23.0 (*)    All other components within normal limits  POCT PREGNANCY, URINE     Imaging Review No results found.   Visual Acuity Review  Right Eye Distance:   Left Eye Distance:   Bilateral Distance:    Right Eye Near:   Left Eye Near:    Bilateral Near:         MDM   1. Dehydration   2. Diabetic ketoacidosis without coma associated with type 1 diabetes mellitus ([place])   While here in clinic, a UA, Ua-pregnancy test, I-stat were obtained. You have also been given an injection of Zofran for nausea. Your laboratory values are abnormal and I recommend you go to the Emergency Room for further treatment and evaluation. Your Potassium was 5.2, BUN 31, SrCR 3.3,  Glucose 504, and Hgb and Hct were 7.8 and 23 respectively. Please go to the ER for further treatment.     Barnet Glasgow, NP 03/01/16 1504

## 2016-03-01 NOTE — ED Triage Notes (Signed)
Pt is diabetic and has had a stomach virus since wed. Vomiting, nausea, diarrhea, sore throat, cough, loss of appetite, fever but unsure of degree. Been taking tylenol, mucinex, immodium and phenergan. Helped with cough but nothing else.

## 2016-03-01 NOTE — ED Notes (Signed)
Pt still roomed in ED; admitting provider at bedside.

## 2016-03-02 DIAGNOSIS — E1010 Type 1 diabetes mellitus with ketoacidosis without coma: Principal | ICD-10-CM

## 2016-03-02 LAB — MRSA PCR SCREENING: MRSA by PCR: NEGATIVE

## 2016-03-02 LAB — BASIC METABOLIC PANEL
ANION GAP: 8 (ref 5–15)
ANION GAP: 5 (ref 5–15)
Anion gap: 7 (ref 5–15)
BUN: 31 mg/dL — ABNORMAL HIGH (ref 6–20)
BUN: 29 mg/dL — ABNORMAL HIGH (ref 6–20)
BUN: 27 mg/dL — ABNORMAL HIGH (ref 6–20)
CALCIUM: 8 mg/dL — AB (ref 8.9–10.3)
CHLORIDE: 116 mmol/L — AB (ref 101–111)
CHLORIDE: 116 mmol/L — AB (ref 101–111)
CHLORIDE: 115 mmol/L — AB (ref 101–111)
CO2: 17 mmol/L — AB (ref 22–32)
CO2: 17 mmol/L — AB (ref 22–32)
CO2: 18 mmol/L — ABNORMAL LOW (ref 22–32)
Calcium: 8 mg/dL — ABNORMAL LOW (ref 8.9–10.3)
Calcium: 8.1 mg/dL — ABNORMAL LOW (ref 8.9–10.3)
Creatinine, Ser: 2.68 mg/dL — ABNORMAL HIGH (ref 0.44–1.00)
Creatinine, Ser: 2.64 mg/dL — ABNORMAL HIGH (ref 0.44–1.00)
Creatinine, Ser: 2.64 mg/dL — ABNORMAL HIGH (ref 0.44–1.00)
GFR calc Af Amer: 27 mL/min — ABNORMAL LOW (ref >60)
GFR calc non Af Amer: 23 mL/min — ABNORMAL LOW (ref >60)
GFR calc non Af Amer: 23 mL/min — ABNORMAL LOW (ref >60)
GFR calc non Af Amer: 23 mL/min — ABNORMAL LOW (ref >60)
GFR, EST AFRICAN AMERICAN: 27 mL/min — AB (ref >60)
GFR, EST AFRICAN AMERICAN: 27 mL/min — AB (ref >60)
GLUCOSE: 110 mg/dL — AB (ref 65–99)
GLUCOSE: 132 mg/dL — AB (ref 65–99)
GLUCOSE: 175 mg/dL — AB (ref 65–99)
POTASSIUM: 4.2 mmol/L (ref 3.5–5.1)
POTASSIUM: 4.1 mmol/L (ref 3.5–5.1)
Potassium: 4.3 mmol/L (ref 3.5–5.1)
Sodium: 141 mmol/L (ref 135–145)
Sodium: 140 mmol/L (ref 135–145)
Sodium: 138 mmol/L (ref 135–145)

## 2016-03-02 LAB — GLUCOSE, CAPILLARY
GLUCOSE-CAPILLARY: 108 mg/dL — AB (ref 65–99)
GLUCOSE-CAPILLARY: 122 mg/dL — AB (ref 65–99)
GLUCOSE-CAPILLARY: 125 mg/dL — AB (ref 65–99)
GLUCOSE-CAPILLARY: 340 mg/dL — AB (ref 65–99)
Glucose-Capillary: 108 mg/dL — ABNORMAL HIGH (ref 65–99)
Glucose-Capillary: 111 mg/dL — ABNORMAL HIGH (ref 65–99)
Glucose-Capillary: 138 mg/dL — ABNORMAL HIGH (ref 65–99)
Glucose-Capillary: 147 mg/dL — ABNORMAL HIGH (ref 65–99)
Glucose-Capillary: 143 mg/dL — ABNORMAL HIGH (ref 65–99)
Glucose-Capillary: 174 mg/dL — ABNORMAL HIGH (ref 65–99)
Glucose-Capillary: 191 mg/dL — ABNORMAL HIGH (ref 65–99)

## 2016-03-02 LAB — CBC WITH DIFFERENTIAL/PLATELET
BASOS PCT: 0 %
Basophils Absolute: 0 10*3/uL (ref 0.0–0.1)
Eosinophils Absolute: 0.1 10*3/uL (ref 0.0–0.7)
Eosinophils Relative: 1 %
HEMATOCRIT: 18.9 % — AB (ref 36.0–46.0)
HEMOGLOBIN: 6.5 g/dL — AB (ref 12.0–15.0)
LYMPHS PCT: 43 %
Lymphs Abs: 1.9 10*3/uL (ref 0.7–4.0)
MCH: 29.5 pg (ref 26.0–34.0)
MCHC: 34.4 g/dL (ref 30.0–36.0)
MCV: 85.9 fL (ref 78.0–100.0)
MONO ABS: 0.3 10*3/uL (ref 0.1–1.0)
MONOS PCT: 8 %
NEUTROS ABS: 2.1 10*3/uL (ref 1.7–7.7)
NEUTROS PCT: 48 %
Platelets: 215 10*3/uL (ref 150–400)
RBC: 2.2 MIL/uL — ABNORMAL LOW (ref 3.87–5.11)
RDW: 13.1 % (ref 11.5–15.5)
WBC: 4.4 10*3/uL (ref 4.0–10.5)

## 2016-03-02 LAB — CBC
HCT: 21.9 % — ABNORMAL LOW (ref 36.0–46.0)
Hemoglobin: 7.6 g/dL — ABNORMAL LOW (ref 12.0–15.0)
MCH: 29.3 pg (ref 26.0–34.0)
MCHC: 34.7 g/dL (ref 30.0–36.0)
MCV: 84.6 fL (ref 78.0–100.0)
PLATELETS: 210 10*3/uL (ref 150–400)
RBC: 2.59 MIL/uL — ABNORMAL LOW (ref 3.87–5.11)
RDW: 14 % (ref 11.5–15.5)
WBC: 5.8 10*3/uL (ref 4.0–10.5)

## 2016-03-02 LAB — PREPARE RBC (CROSSMATCH)

## 2016-03-02 LAB — ABO/RH: ABO/RH(D): O POS

## 2016-03-02 MED ORDER — INSULIN ASPART 100 UNIT/ML ~~LOC~~ SOLN
3.0000 [IU] | Freq: Three times a day (TID) | SUBCUTANEOUS | Status: DC
  Administered 2016-03-02 – 2016-03-04 (×5): 3 [IU] via SUBCUTANEOUS

## 2016-03-02 MED ORDER — ACETAMINOPHEN 325 MG PO TABS
650.0000 mg | ORAL_TABLET | Freq: Once | ORAL | Status: AC
  Administered 2016-03-02: 650 mg via ORAL
  Filled 2016-03-02: qty 2

## 2016-03-02 MED ORDER — INSULIN DETEMIR 100 UNIT/ML ~~LOC~~ SOLN
4.0000 [IU] | Freq: Every day | SUBCUTANEOUS | Status: DC
  Administered 2016-03-02: 4 [IU] via SUBCUTANEOUS
  Filled 2016-03-02: qty 0.04

## 2016-03-02 MED ORDER — FUROSEMIDE 10 MG/ML IJ SOLN
20.0000 mg | Freq: Once | INTRAMUSCULAR | Status: AC
  Administered 2016-03-02: 20 mg via INTRAVENOUS
  Filled 2016-03-02: qty 2

## 2016-03-02 MED ORDER — INSULIN ASPART 100 UNIT/ML ~~LOC~~ SOLN
0.0000 [IU] | Freq: Three times a day (TID) | SUBCUTANEOUS | Status: DC
  Administered 2016-03-02: 7 [IU] via SUBCUTANEOUS
  Administered 2016-03-02: 2 [IU] via SUBCUTANEOUS
  Administered 2016-03-03: 1 [IU] via SUBCUTANEOUS
  Administered 2016-03-03: 7 [IU] via SUBCUTANEOUS
  Administered 2016-03-04: 1 [IU] via SUBCUTANEOUS

## 2016-03-02 MED ORDER — DIPHENHYDRAMINE HCL 25 MG PO CAPS
25.0000 mg | ORAL_CAPSULE | Freq: Once | ORAL | Status: AC
  Administered 2016-03-02: 25 mg via ORAL
  Filled 2016-03-02: qty 1

## 2016-03-02 MED ORDER — SODIUM CHLORIDE 0.9 % IV SOLN
Freq: Once | INTRAVENOUS | Status: AC
  Administered 2016-03-02: 11:00:00 via INTRAVENOUS

## 2016-03-02 MED ORDER — SODIUM CHLORIDE 0.9 % IV SOLN
INTRAVENOUS | Status: DC
  Administered 2016-03-02 – 2016-03-03 (×4): via INTRAVENOUS

## 2016-03-02 MED ORDER — SODIUM CHLORIDE 0.9 % IV SOLN: Freq: Once | INTRAVENOUS | Status: DC

## 2016-03-02 NOTE — Progress Notes (Signed)
Initial Nutrition Assessment  DOCUMENTATION CODES:   Severe malnutrition in context of acute illness/injury  INTERVENTION:  - Diet advancement as medically feasible. - RD will provide diet education, if needed, prior to d/c.  NUTRITION DIAGNOSIS:   Inadequate oral intake related to inability to eat as evidenced by NPO status.  GOAL:   Patient will meet greater than or equal to 90% of their needs  MONITOR:   Diet advancement, Weight trends, Labs, I & O's  REASON FOR ASSESSMENT:   Malnutrition Screening Tool  ASSESSMENT:   31 y.o. female with history of diabetes mellitus type 1, hypertension, chronic kidney disease stage 3-4 was referred to the ER from urgent care center after patient was found to be having markedly elevated blood sugar. Patient states over the last 2 days patient has been having sore throat with nausea and vomiting. Had one episode of diarrhea. Has been having some lower abdominal discomfort. In the ER patient is found to have blood sugar more than 500 with VBG showing pH of 7.2. Patient was started on IV fluid bolus and IV insulin infusion for DKA. Patient's influenza PCR turned out to be positive for influenza A and was started on Tamiflu. Patient is being admitted for DKA and influenza.   Pt seen for MST. BMI indicates normal weight/borderline overweight. Per notes, pt recently became pregnant; nutrition needs not adjusted for this based on pt still in the first trimester. She has been NPO since admission. Per pt report she has had decreased appetite and intakes for several days and had nothing PO in the 2 days leading up to hospitalization. Pt with ongoing N/V for 2 days PTA, no emesis since admission.   Unable to obtain much further information from pt at this time and unable to complete physical assessment at this time; will attempt to complete at follow-up and will also monitor for education-related needs at that time. Per chart review, pt has lost 8 lbs (5%  body weight) in the past 1 month which is significant for time frame. She meets criteria for severe malnutrition based on weight loss and <50% intakes for >/=5 days.  Medications reviewed; 20 mg IV Lasix x1 dose today, sliding scale Novolog, 3 units Novolog TID, 4 units Levemir/day. Labs reviewed; CBGs: 108-340 mg/dL, Cl: 116 mmol/L, BUN: 29 mg/dL, creatinine: 2.64 mg/dL, Ca: 8 mg/dL, GFR: 23 mL/min.   IVF: NS @ 75 mL/hr.    Diet Order:  Diet Carb Modified Fluid consistency: Thin; Room service appropriate? Yes  Skin:  Reviewed, no issues  Last BM:  1/17 (PTA)  Height:   Ht Readings from Last 1 Encounters:  03/01/16 5\' 5"  (1.651 m)    Weight:   Wt Readings from Last 1 Encounters:  03/01/16 148 lb 9.6 oz (67.4 kg)    Ideal Body Weight:  56.82 kg  BMI:  Body mass index is 24.73 kg/m.  Estimated Nutritional Needs:   Kcal:  1550-1750 (23-26 kcal/kg)  Protein:  65-75 grams  Fluid:  >/= 1.7 L/day  EDUCATION NEEDS:   Education needs no appropriate at this time    Jarome Matin, MS, RD, LDN, [facility] Clinical Dietitian Pager # [phone] After hours/weekend pager # [phone]

## 2016-03-02 NOTE — Plan of Care (Signed)
Problem: Metabolic: Goal: Ability to maintain appropriate glucose levels will improve Outcome: Progressing Pt started on an insulin drip to control CBG within normal range

## 2016-03-02 NOTE — Progress Notes (Signed)
PROGRESS NOTE    Christine Reed  JME:[id number] DOB: 1986-01-11 DOA: 03/01/2016 PCP: Reginia Forts, MD    Brief Narrative:  31 y/o ? 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 Pregnancy complicated recently by superimposed preeclampsia Previous admission 2009 for DKA  Patient referred from urgent care center's blood sugars in the 400 range with a complaint of nausea for throat Found to have that sugar more than 500 pH 7.2 and started on treatment for DKA Flu PCR was positive Baseline BUN/creatinine 14/1.8  on admission 31/3.3 Baseline hemoglobin was 7.8  on admission found to be 6.5--> transfused 1 unit packed red blood cells   Assessment & Plan:   Principal Problem:   Type 1 diabetes mellitus with ketoacidosis without coma (HCC) Active Problems:   Anemia, chronic disease   CKD (chronic kidney disease) stage 3, GFR 30-59 ml/min   Nephrotic syndrome   DKA, type 1 (HCC)   Nausea & vomiting   Influenza A   Mild DKA-anion gap has closed. See below Type 1 diabetes mellitus, nephropathy, retinopathy-placed on 4 units of Levemir daily at bedtime-home dose is 10 a.m. 8 PM. Continue sliding scale coverage and mealtime coverage H. influenzae A- continue Tamiflu, Tylenol for symptomatic relief Anemia in the setting of recent Pregnancy/dilutional component secondary to fluid for DKA-Hemoglobin 7.2-->6.5, transfuse 1 U prbc.  Baseline 8.9.  Repeat hemoglobin later Prior preeclampsia-the pressures are stable Acute kidney injury/biopsy-proven diabetic glomerulopathy- on admission 31/3.3, continue IV fluid 75 cc per hour currently 29/2.6.  Will probably need follow-up as an outpatient with nephrology Dr. Loletha Grayer   DVT prophylaxis: Lovenox Code Status: Full Family Communication: Discussed with mother Disposition  Plan: Inpatient stepped-down today   Consultants:   None  Procedures:     Antimicrobials:   Tamiflu    Subjective: Feels tired. Hungry as well No nausea no vomiting No chest pain No blurred nor double vision  Objective: Vitals:   03/01/16 2321 03/02/16 0000 03/02/16 0400 03/02/16 0722  BP: 113/73 120/77 124/76 131/80  Pulse: (!) 101 100 95 92  Resp: 16 18 16 15   Temp: 99.5 F (37.5 C)  98.5 F (36.9 C) 98.4 F (36.9 C)  TempSrc: Oral  Oral Oral  SpO2: 100% 100% 100% 100%  Weight:      Height:        Intake/Output Summary (Last 24 hours) at 03/02/16 0746 Last data filed at 03/02/16 0600  Gross per 24 hour  Intake          3717.32 ml  Output              800 ml  Net          2917.32 ml   Filed Weights   03/01/16 1602 03/01/16 2115  Weight: 66.2 kg (146 lb) 67.4 kg (148 lb 9.6 oz)    Examination:  General exam: Tired-appearing Respiratory system: Clear to auscultation Cardiovascular system: S1 & S2 heard, slightly tachycardic Gastrointestinal system: Abdomen is nondistended, soft and nontender Central nervous system: Alert and oriented. No focal neurological deficits. Extremities: Symmetric 5 x 5 power. Skin: No rashes, lesions or ulcers Psychiatry: Judgement and insight appear normal. Mood & affect appropriate.     Data Reviewed: I have personally reviewed following labs and imaging studies  CBC:  Recent Labs Lab 03/01/16 1452 03/01/16 1804 03/02/16 0543  WBC  --  5.6 4.4  NEUTROABS  --  3.9 2.1  HGB 7.8* 7.8* 6.5*  HCT 23.0* 22.3* 18.9*  MCV  --  86.8 85.9  PLT  --  237 782   Basic Metabolic Panel:  Recent Labs Lab 03/01/16 1452 03/01/16 1804 03/01/16 2209 03/02/16 0219 03/02/16 0543  NA 136 135 136 141 140  K 5.2* 5.3* 4.0 4.3 4.2  CL 109 109 113* 116* 116*  CO2  --  17* 16* 17* 17*  GLUCOSE 504* 507* 205* 110* 132*  BUN 31* 38* 31* 31* 29*  CREATININE 3.30* 3.12* 2.81* 2.68* 2.64*  CALCIUM  --  8.1* 7.9* 8.0* 8.0*    GFR: Estimated Creatinine Clearance: 28.5 mL/min (by C-G formula based on SCr of 2.64 mg/dL (H)). Liver Function Tests:  Recent Labs Lab 03/01/16 1804  AST 27  ALT 25  ALKPHOS 63  BILITOT 0.9  PROT 5.1*  ALBUMIN 1.9*    Recent Labs Lab 03/01/16 1804  LIPASE 10*   No results for input(s): AMMONIA in the last 168 hours. Coagulation Profile: No results for input(s): INR, PROTIME in the last 168 hours. Cardiac Enzymes: No results for input(s): CKTOTAL, CKMB, CKMBINDEX, TROPONINI in the last 168 hours. BNP (last 3 results) No results for input(s): PROBNP in the last 8760 hours. HbA1C: No results for input(s): HGBA1C in the last 72 hours. CBG:  Recent Labs Lab 03/02/16 0230 03/02/16 0336 03/02/16 0503 03/02/16 0613 03/02/16 0719  GLUCAP 108* 111* 122* 138* 147*   Lipid Profile: No results for input(s): CHOL, HDL, LDLCALC, TRIG, CHOLHDL, LDLDIRECT in the last 72 hours. Thyroid Function Tests: No results for input(s): TSH, T4TOTAL, FREET4, T3FREE, THYROIDAB in the last 72 hours. Anemia Panel: No results for input(s): VITAMINB12, FOLATE, FERRITIN, TIBC, IRON, RETICCTPCT in the last 72 hours. Sepsis Labs: No results for input(s): PROCALCITON, LATICACIDVEN in the last 168 hours.  Recent Results (from the past 240 hour(s))  Rapid strep screen (not at [facility])     Status: None   Collection Time: 03/01/16  5:17 PM  Result Value Ref Range Status   Streptococcus, Group A Screen (Direct) NEGATIVE NEGATIVE Final    Comment: (NOTE) A Rapid Antigen test may result negative if the antigen level in the sample is below the detection level of this test. The FDA has not cleared this test as a stand-alone test therefore the rapid antigen negative result has reflexed to a Group A Strep culture.   MRSA PCR Screening     Status: None   Collection Time: 03/01/16  9:59 PM  Result Value Ref Range Status   MRSA by PCR NEGATIVE NEGATIVE Final    Comment:        The GeneXpert MRSA  Assay (FDA approved for NASAL specimens only), is one component of a comprehensive MRSA colonization surveillance program. It is not intended to diagnose MRSA infection nor to guide or monitor treatment for MRSA infections.          Radiology Studies: Dg Chest 2 View  Result Date: 03/01/2016 CLINICAL DATA:  X 3 days patient reports cough, fever, diarrhea, vomiting. EXAM: CHEST  2 VIEW COMPARISON:  None. FINDINGS: Normal mediastinum and cardiac silhouette. Normal pulmonary vasculature. No evidence of effusion, infiltrate, or pneumothorax. No acute bony abnormality. IMPRESSION: No acute cardiopulmonary process. Electronically Signed   By: Suzy Bouchard M.D.   On: 03/01/2016 18:31        Scheduled Meds: . sodium chloride   Intravenous Once  . heparin  5,000  Units Subcutaneous Q8H  . oseltamivir  30 mg Oral Daily   Continuous Infusions: . sodium chloride Stopped (03/02/16 0447)  . dextrose 5 % and 0.45% NaCl 125 mL/hr at 03/02/16 0525  . insulin (NOVOLIN-R) infusion 0.5 Units/hr (03/02/16 0342)     LOS: 1 day    Time spent: [place], [place], MD [facility] Pager [phone]  If 7PM-7AM, please contact night-coverage [email or web address] Password [facility] 03/02/2016, 7:46 AM

## 2016-03-02 NOTE — Progress Notes (Signed)
CRITICAL VALUE ALERT  Critical value received: hgb 6.5  Date of notification:  03/02/2016  Time of notification:  06:39  Critical value read back:Yes  Nurse who received alert: Malen Gauze  MD notified (1st page): M. Lynch   Time of first page:  06:47  MD notified (2nd page):   Time of second page:  Responding MD:  M.Donnal Debar  Time MD responded:  06:54

## 2016-03-03 LAB — GLUCOSE, CAPILLARY
Glucose-Capillary: 316 mg/dL — ABNORMAL HIGH (ref 65–99)
Glucose-Capillary: 143 mg/dL — ABNORMAL HIGH (ref 65–99)
Glucose-Capillary: 77 mg/dL (ref 65–99)
Glucose-Capillary: 128 mg/dL — ABNORMAL HIGH (ref 65–99)

## 2016-03-03 LAB — COMPREHENSIVE METABOLIC PANEL
ALT: 43 U/L (ref 14–54)
AST: 69 U/L — ABNORMAL HIGH (ref 15–41)
Albumin: 1.6 g/dL — ABNORMAL LOW (ref 3.5–5.0)
Alkaline Phosphatase: 52 U/L (ref 38–126)
Anion gap: 3 — ABNORMAL LOW (ref 5–15)
BUN: 27 mg/dL — ABNORMAL HIGH (ref 6–20)
CO2: 19 mmol/L — ABNORMAL LOW (ref 22–32)
Calcium: 7.7 mg/dL — ABNORMAL LOW (ref 8.9–10.3)
Chloride: 114 mmol/L — ABNORMAL HIGH (ref 101–111)
Creatinine, Ser: 2.55 mg/dL — ABNORMAL HIGH (ref 0.44–1.00)
GFR calc Af Amer: 28 mL/min — ABNORMAL LOW (ref >60)
GFR calc non Af Amer: 24 mL/min — ABNORMAL LOW (ref >60)
Glucose, Bld: 282 mg/dL — ABNORMAL HIGH (ref 65–99)
Potassium: 4.3 mmol/L (ref 3.5–5.1)
Sodium: 136 mmol/L (ref 135–145)
Total Bilirubin: 0.7 mg/dL (ref 0.3–1.2)
Total Protein: 4.5 g/dL — ABNORMAL LOW (ref 6.5–8.1)

## 2016-03-03 LAB — TYPE AND SCREEN
ABO/RH(D): O POS
Antibody Screen: NEGATIVE
UNIT DIVISION: 0

## 2016-03-03 LAB — CBC
HCT: 22 % — ABNORMAL LOW (ref 36.0–46.0)
HEMATOCRIT: 19.5 % — AB (ref 36.0–46.0)
Hemoglobin: 6.7 g/dL — CL (ref 12.0–15.0)
Hemoglobin: 7.6 g/dL — ABNORMAL LOW (ref 12.0–15.0)
MCH: 29.6 pg (ref 26.0–34.0)
MCH: 29.1 pg (ref 26.0–34.0)
MCHC: 34.4 g/dL (ref 30.0–36.0)
MCHC: 34.5 g/dL (ref 30.0–36.0)
MCV: 86.3 fL (ref 78.0–100.0)
MCV: 84.3 fL (ref 78.0–100.0)
Platelets: 218 10*3/uL (ref 150–400)
Platelets: 206 10*3/uL (ref 150–400)
RBC: 2.26 MIL/uL — AB (ref 3.87–5.11)
RBC: 2.61 MIL/uL — ABNORMAL LOW (ref 3.87–5.11)
RDW: 13.1 % (ref 11.5–15.5)
RDW: 14.3 % (ref 11.5–15.5)
WBC: 4.2 10*3/uL (ref 4.0–10.5)
WBC: 5 10*3/uL (ref 4.0–10.5)

## 2016-03-03 MED ORDER — INSULIN DETEMIR 100 UNIT/ML ~~LOC~~ SOLN
6.0000 [IU] | Freq: Two times a day (BID) | SUBCUTANEOUS | Status: DC
  Administered 2016-03-03 – 2016-03-04 (×3): 6 [IU] via SUBCUTANEOUS
  Filled 2016-03-03 (×3): qty 0.06

## 2016-03-03 NOTE — Progress Notes (Signed)
PROGRESS NOTE    Christine Reed  IWO:[id number] DOB: 01/30/1986 DOA: 03/01/2016 PCP: Reginia Forts, MD    Brief Narrative:   31 y/o ? 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 Pregnancy complicated recently by superimposed preeclampsia Previous admission 2009 for DKA  Patient referred from urgent care center's blood sugars in the 400 range with a complaint of nausea for throat Found to have that sugar more than 500 pH 7.2 and started on treatment for DKA Flu PCR was positive Baseline BUN/creatinine 14/1.8  on admission 31/3.3 Baseline hemoglobin was 7.8  on admission found to be 6.5--> transfused 1 unit packed red blood cells   Assessment & Plan:   Principal Problem:   Type 1 diabetes mellitus with ketoacidosis without coma (HCC) Active Problems:   Anemia, chronic disease   CKD (chronic kidney disease) stage 3, GFR 30-59 ml/min   Nephrotic syndrome   DKA, type 1 (HCC)   Nausea & vomiting   Influenza A   Mild DKA-anion gap has closed. See below Type 1 diabetes mellitus, nephropathy, retinopathy-placed on 4 units of Levemir daily at bedtime-home dose is 10 a.m. 8 PM. Sugars 200-300, so increased levemir to 6 bid-continue to adjust as prn H. influenzae A- continue Tamiflu, Tylenol for symptomatic relief Anemia in the setting of recent Pregnancy/dilutional component secondary to fluid for DKA-Hemoglobin 7.2-->6.5, transfuse 1 U prbc 1/20-->7.6.  Baseline 8.9.  Repeat hemoglobin am 1/22 Prior preeclampsia-the pressures are stable Acute kidney injury/biopsy-proven diabetic glomerulopathy- on admission 31/3.3, continue IV fluid 75 cc per hour currently 29/2.6--->27/2.55.  Will probably need follow-up as an outpatient with nephrology Dr. Loletha Grayer   DVT prophylaxis: Lovenox Code Status: Full Family  Communication: Discussed with mother Disposition Plan: Inpatient stepped-down today   Consultants:   None  Procedures:     Antimicrobials:   Tamiflu    Subjective:  Looks, feels better A little stronger  still with mild cough No cp No n/v   Objective: Vitals:   03/03/16 0004 03/03/16 0343 03/03/16 0751 03/03/16 0809  BP: (!) 129/95 (!) 142/94  128/88  Pulse: 97 81 87 91  Resp: 14 14 12 14   Temp: 98.6 F (37 C) 98.5 F (36.9 C)  97.9 F (36.6 C)  TempSrc: Oral Oral Oral Oral  SpO2: 100% 97% 100% 100%  Weight:      Height:        Intake/Output Summary (Last 24 hours) at 03/03/16 0934 Last data filed at 03/03/16 0800  Gross per 24 hour  Intake             2200 ml  Output             3325 ml  Net            -1125 ml   Filed Weights   03/01/16 1602 03/01/16 2115  Weight: 66.2 kg (146 lb) 67.4 kg (148 lb 9.6 oz)    Examination:  General exam: appears to feel and look better Respiratory system: Clear to auscultation bilaterally Cardiovascular system: S1 & S2 heard, slightly tachycardic Gastrointestinal system: Abdomen is nondistended, soft and nontender Central nervous system: Alert and oriented. No focal neurological deficits. Extremities: Symmetric 5 x 5 power. Skin: No rashes, lesions or ulcers  Data Reviewed: I have personally reviewed following labs and imaging studies  CBC:  Recent Labs Lab 03/01/16 1804 03/02/16 0543 03/02/16  6213 03/02/16 1548 03/03/16 0251  WBC 5.6 4.4 4.2 5.8 5.0  NEUTROABS 3.9 2.1  --   --   --   HGB 7.8* 6.5* 6.7* 7.6* 7.6*  HCT 22.3* 18.9* 19.5* 21.9* 22.0*  MCV 86.8 85.9 86.3 84.6 84.3  PLT 237 215 218 210 086   Basic Metabolic Panel:  Recent Labs Lab 03/01/16 2209 03/02/16 0219 03/02/16 0543 03/02/16 0927 03/03/16 0251  NA 136 141 140 138 136  K 4.0 4.3 4.2 4.1 4.3  CL 113* 116* 116* 115* 114*  CO2 16* 17* 17* 18* 19*  GLUCOSE 205* 110* 132* 175* 282*  BUN 31* 31* 29* 27* 27*  CREATININE 2.81*  2.68* 2.64* 2.64* 2.55*  CALCIUM 7.9* 8.0* 8.0* 8.1* 7.7*   GFR: Estimated Creatinine Clearance: 29.6 mL/min (by C-G formula based on SCr of 2.55 mg/dL (H)). Liver Function Tests:  Recent Labs Lab 03/01/16 1804 03/03/16 0251  AST 27 69*  ALT 25 43  ALKPHOS 63 52  BILITOT 0.9 0.7  PROT 5.1* 4.5*  ALBUMIN 1.9* 1.6*    Recent Labs Lab 03/01/16 1804  LIPASE 10*   No results for input(s): AMMONIA in the last 168 hours. Coagulation Profile: No results for input(s): INR, PROTIME in the last 168 hours. Cardiac Enzymes: No results for input(s): CKTOTAL, CKMB, CKMBINDEX, TROPONINI in the last 168 hours. BNP (last 3 results) No results for input(s): PROBNP in the last 8760 hours. HbA1C: No results for input(s): HGBA1C in the last 72 hours. CBG:  Recent Labs Lab 03/02/16 0825 03/02/16 0936 03/02/16 1247 03/02/16 1636 03/03/16 0807  GLUCAP 143* 174* 340* 191* 316*   Lipid Profile: No results for input(s): CHOL, HDL, LDLCALC, TRIG, CHOLHDL, LDLDIRECT in the last 72 hours. Thyroid Function Tests: No results for input(s): TSH, T4TOTAL, FREET4, T3FREE, THYROIDAB in the last 72 hours. Anemia Panel: No results for input(s): VITAMINB12, FOLATE, FERRITIN, TIBC, IRON, RETICCTPCT in the last 72 hours. Sepsis Labs: No results for input(s): PROCALCITON, LATICACIDVEN in the last 168 hours.  Recent Results (from the past 240 hour(s))  Rapid strep screen (not at [facility])     Status: None   Collection Time: 03/01/16  5:17 PM  Result Value Ref Range Status   Streptococcus, Group A Screen (Direct) NEGATIVE NEGATIVE Final    Comment: (NOTE) A Rapid Antigen test may result negative if the antigen level in the sample is below the detection level of this test. The FDA has not cleared this test as a stand-alone test therefore the rapid antigen negative result has reflexed to a Group A Strep culture.   Culture, group A strep     Status: None (Preliminary result)   Collection Time: 03/01/16   5:17 PM  Result Value Ref Range Status   Specimen Description THROAT  Final   Special Requests NONE  Final   Culture TOO YOUNG TO READ  Final   Report Status PENDING  Incomplete  MRSA PCR Screening     Status: None   Collection Time: 03/01/16  9:59 PM  Result Value Ref Range Status   MRSA by PCR NEGATIVE NEGATIVE Final    Comment:        The GeneXpert MRSA Assay (FDA approved for NASAL specimens only), is one component of a comprehensive MRSA colonization surveillance program. It is not intended to diagnose MRSA infection nor to guide or monitor treatment for MRSA infections.          Radiology Studies: Dg Chest 2 View  Result Date:  03/01/2016 CLINICAL DATA:  X 3 days patient reports cough, fever, diarrhea, vomiting. EXAM: CHEST  2 VIEW COMPARISON:  None. FINDINGS: Normal mediastinum and cardiac silhouette. Normal pulmonary vasculature. No evidence of effusion, infiltrate, or pneumothorax. No acute bony abnormality. IMPRESSION: No acute cardiopulmonary process. Electronically Signed   By: Suzy Bouchard M.D.   On: 03/01/2016 18:31        Scheduled Meds: . sodium chloride   Intravenous Once  . heparin  5,000 Units Subcutaneous Q8H  . insulin aspart  0-9 Units Subcutaneous TID WC  . insulin aspart  3 Units Subcutaneous TID WC  . insulin detemir  4 Units Subcutaneous QHS  . oseltamivir  30 mg Oral Daily   Continuous Infusions: . sodium chloride 75 mL/hr at 03/02/16 2145     LOS: 2 days    Time spent: [place], [place], MD [facility] Pager [phone]  If 7PM-7AM, please contact night-coverage [email or web address] Password [facility] 03/03/2016, 9:34 AM

## 2016-03-04 ENCOUNTER — Encounter: Payer: Self-pay | Admitting: Family Medicine

## 2016-03-04 LAB — RETICULOCYTES
RBC.: 2.6 MIL/uL — ABNORMAL LOW (ref 3.87–5.11)
RETIC CT PCT: 0.9 % (ref 0.4–3.1)
Retic Count, Absolute: 23.4 10*3/uL (ref 19.0–186.0)

## 2016-03-04 LAB — GLUCOSE, CAPILLARY
Glucose-Capillary: 189 mg/dL — ABNORMAL HIGH (ref 65–99)
Glucose-Capillary: 136 mg/dL — ABNORMAL HIGH (ref 65–99)

## 2016-03-04 LAB — CBC WITH DIFFERENTIAL/PLATELET
Basophils Absolute: 0 10*3/uL (ref 0.0–0.1)
Basophils Relative: 0 %
Eosinophils Absolute: 0.1 10*3/uL (ref 0.0–0.7)
Eosinophils Relative: 2 %
HEMATOCRIT: 20.5 % — AB (ref 36.0–46.0)
HEMOGLOBIN: 7.2 g/dL — AB (ref 12.0–15.0)
LYMPHS ABS: 2.5 10*3/uL (ref 0.7–4.0)
LYMPHS PCT: 48 %
MCH: 29.5 pg (ref 26.0–34.0)
MCHC: 35.1 g/dL (ref 30.0–36.0)
MCV: 84 fL (ref 78.0–100.0)
MONOS PCT: 6 %
Monocytes Absolute: 0.3 10*3/uL (ref 0.1–1.0)
NEUTROS PCT: 44 %
Neutro Abs: 2.3 10*3/uL (ref 1.7–7.7)
Platelets: 208 10*3/uL (ref 150–400)
RBC: 2.44 MIL/uL — AB (ref 3.87–5.11)
RDW: 13.7 % (ref 11.5–15.5)
WBC: 5.3 10*3/uL (ref 4.0–10.5)

## 2016-03-04 LAB — BASIC METABOLIC PANEL
Anion gap: 4 — ABNORMAL LOW (ref 5–15)
BUN: 20 mg/dL (ref 6–20)
CHLORIDE: 117 mmol/L — AB (ref 101–111)
CO2: 18 mmol/L — AB (ref 22–32)
Calcium: 7.7 mg/dL — ABNORMAL LOW (ref 8.9–10.3)
Creatinine, Ser: 2.19 mg/dL — ABNORMAL HIGH (ref 0.44–1.00)
GFR calc non Af Amer: 29 mL/min — ABNORMAL LOW (ref >60)
GFR, EST AFRICAN AMERICAN: 34 mL/min — AB (ref >60)
Glucose, Bld: 155 mg/dL — ABNORMAL HIGH (ref 65–99)
POTASSIUM: 3.8 mmol/L (ref 3.5–5.1)
SODIUM: 139 mmol/L (ref 135–145)

## 2016-03-04 LAB — CULTURE, GROUP A STREP (THRC)

## 2016-03-04 LAB — IRON AND TIBC
IRON: 62 ug/dL (ref 28–170)
SATURATION RATIOS: 36 % — AB (ref 10.4–31.8)
TIBC: 171 ug/dL — AB (ref 250–450)
UIBC: 109 ug/dL

## 2016-03-04 LAB — FOLATE: Folate: 23 ng/mL (ref >5.9)

## 2016-03-04 LAB — FERRITIN: Ferritin: 841 ng/mL — ABNORMAL HIGH (ref 11–307)

## 2016-03-04 LAB — VITAMIN B12: VITAMIN B 12: 1257 pg/mL — AB (ref 180–914)

## 2016-03-04 MED ORDER — FUROSEMIDE 20 MG PO TABS: 20.0000 mg | ORAL_TABLET | ORAL | 0 refills | Status: DC

## 2016-03-04 MED ORDER — OSELTAMIVIR PHOSPHATE 30 MG PO CAPS: 30.0000 mg | ORAL_CAPSULE | Freq: Every day | ORAL | 0 refills | Status: DC

## 2016-03-04 NOTE — Progress Notes (Signed)
Discharge instructions, RX's, follow up appts, and school note explained and provided to patient. Patient left floor via wheelchair accompanied by staff no c/o pain or shortness of breath at d/c.  Araminta Zorn, Tivis Ringer, RN

## 2016-03-04 NOTE — Plan of Care (Signed)
Problem: Tissue Perfusion: Goal: Risk factors for ineffective tissue perfusion will decrease Outcome: Not Progressing Patient refusing heparin injections, stating they " hurt too much." Patient educated that they are important for preventing blood clots, but she still does not want them.

## 2016-03-04 NOTE — Discharge Summary (Signed)
Physician Discharge Summary  Christine Reed CNO:[id number] DOB: 1986/01/25 DOA: 03/01/2016  PCP: states she has none   Admit date: 03/01/2016 Discharge date: 03/04/2016  Time spent: 35 minutes  Recommendations for Outpatient Follow-up:  1. Recommend completion of Tamiflu 1/24 2. Changed Lasix(20 OD to every other day 3. Given Aranesp prior to discharge  Discharge Diagnoses:  Principal Problem:   Type 1 diabetes mellitus with ketoacidosis without coma ([place]) Active Problems:   Anemia, chronic disease   CKD (chronic kidney disease) stage 3, GFR 30-59 ml/min   Nephrotic syndrome   DKA, type 1 (HCC)   Nausea & vomiting   Influenza A   Discharge Condition: improved  Diet recommendation: diabetic  Filed Weights   03/01/16 1602 03/01/16 2115  Weight: 66.2 kg (146 lb) 67.4 kg (148 lb 9.6 oz)    History of present illness:   31 y/o ? 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 See below Type 1 diabetes mellitus, nephropathy, retinopathy-placed on 4 units of Levemir daily at bedtime-home dose is 10 a.m. 8 PM. Sugars 200-300, so increased levemir to 6 bid-changed on discharge to 10 u AM and  8u afternoon H. influenzae A- continue Tamiflu, Tylenol for symptomatic relief--stop date for Tamiflu  1/24 Anemia in the setting of recent Pregnancy/dilutional component secondary to fluid for DKA-Hemoglobin 7.2-->6.5, transfuse 1 U prbc 1/20-->7.6.  Baseline 8.9.  Repeat hemoglobin am 1/22--gets erythropoietin every 2 weeks so given a shot prior to discharge Prior preeclampsia-the pressures are stable Acute kidney injury/biopsy-proven diabetic glomerulopathy- on admission 31/3.3, continue IV fluid 75 cc per hour currently 29/2.6--->27/2.55.   changed Lasix dose to every other day on discharge    Consultations:  none  Discharge Exam: Vitals:   03/04/16 0351 03/04/16 0747  BP: (!) 136/95 (!) 138/95  Pulse: 79 92  Resp: 17 19  Temp: 98.3 F (36.8 C) 98.2 F (36.8 C)   Unremarkable  Tolerating diet Feels better   General: Alert pleasant oriented no apparent distress Cardiovascular: S1-S2 no murmur rub or gallop Respiratory: Clinically clear no added sound  Discharge Instructions   Discharge Instructions    Diet - low sodium heart healthy    Complete by:  As directed    Discharge instructions    Complete by:  As directed    Take meds as directed Finish the tamiflu Every other day lasix Need labs in about 2 weeks   Increase activity slowly    Complete by:  As directed      Current Discharge Medication List    START taking these medications   Details  oseltamivir (TAMIFLU) 30 MG capsule Take 1 capsule (30 mg total) by mouth daily. Qty: 4 capsule, Refills: 0      CONTINUE these medications which have CHANGED   Details  furosemide (LASIX) 20 MG tablet Take 1 tablet (20 mg total) by mouth  every other day. Qty: 30 tablet, Refills: 0      CONTINUE these medications which have NOT CHANGED   Details  acetaminophen (TYLENOL) 500 MG tablet Take 500 mg by mouth every 6 (six) hours as needed for mild pain or headache.    amLODipine (NORVASC) 10 MG tablet TAKE ONE TABLET BY MOUTH ONCE DAILY Qty: 30 tablet, Refills: 0    benazepril (LOTENSIN) 10 MG tablet Take 10 mg  by mouth daily.  Refills: 1    Dextromethorphan-Guaifenesin (TUSSIN DM PO) Take 10 mLs by mouth every 6 (six) hours as needed (cough/ cold symptoms).    diphenhydrAMINE (BENADRYL) 25 MG tablet Take 25 mg by mouth every 6 (six) hours as needed for itching or allergies.     Insulin Detemir (LEVEMIR) 100 UNIT/ML Pen Inject 4 units twice daily. Qty: 15 mL, Refills: 4    famotidine (PEPCID) 20 MG tablet Take 1 tablet (20 mg total) by mouth 2 (two) times daily. Qty: 60 tablet, Refills: 11      STOP taking these medications     Guaifenesin (MUCINEX MAXIMUM STRENGTH) 1200 MG TB12      insulin aspart (NOVOLOG) 100 UNIT/ML injection      BAYER CONTOUR NEXT TEST test strip        Allergies  Allergen Reactions  . Iodine Rash  . Nickel Rash      The results of significant diagnostics from this hospitalization (including imaging, microbiology, ancillary and laboratory) are listed below for reference.    Significant Diagnostic Studies: Dg Chest 2 View  Result Date: 03/01/2016 CLINICAL DATA:  X 3 days patient reports cough, fever, diarrhea, vomiting. EXAM: CHEST  2 VIEW COMPARISON:  None. FINDINGS: Normal mediastinum and cardiac silhouette. Normal pulmonary vasculature. No evidence of effusion, infiltrate, or pneumothorax. No acute bony abnormality. IMPRESSION: No acute cardiopulmonary process. Electronically Signed   By: Suzy Bouchard M.D.   On: 03/01/2016 18:31    Microbiology: Recent Results (from the past 240 hour(s))  Rapid strep screen (not at [facility])     Status: None   Collection Time: 03/01/16  5:17 PM  Result Value Ref Range Status   Streptococcus, Group A Screen (Direct) NEGATIVE NEGATIVE Final    Comment: (NOTE) A Rapid Antigen test may result negative if the antigen level in the sample is below the detection level of this test. The FDA has not cleared this test as a stand-alone test therefore the rapid antigen negative result has reflexed to a Group A Strep culture.    Culture, group A strep     Status: None (Preliminary result)   Collection Time: 03/01/16  5:17 PM  Result Value Ref Range Status   Specimen Description THROAT  Final   Special Requests NONE  Final   Culture CULTURE REINCUBATED FOR BETTER GROWTH  Final   Report Status PENDING  Incomplete  MRSA PCR Screening     Status: None   Collection Time: 03/01/16  9:59 PM  Result Value Ref Range Status   MRSA by PCR NEGATIVE NEGATIVE Final    Comment:        The GeneXpert MRSA Assay (FDA approved for NASAL specimens only), is one component of a comprehensive MRSA colonization surveillance program. It is not intended to diagnose MRSA infection nor to guide or monitor treatment for MRSA infections.      Labs: Basic Metabolic Panel:  Recent Labs Lab 03/02/16 0219 03/02/16 0543 03/02/16 0927 03/03/16 0251 03/04/16 0345  NA 141 140 138 136 139  K 4.3 4.2 4.1 4.3 3.8  CL 116* 116* 115* 114* 117*  CO2 17* 17* 18* 19* 18*  GLUCOSE 110* 132* 175* 282* 155*  BUN 31* 29* 27* 27* 20  CREATININE 2.68* 2.64* 2.64* 2.55* 2.19*  CALCIUM 8.0* 8.0* 8.1* 7.7* 7.7*   Liver Function Tests:  Recent Labs Lab 03/01/16 1804 03/03/16 0251  AST 27 69*  ALT 25 43  ALKPHOS 63 52  BILITOT 0.9 0.7  PROT 5.1* 4.5*  ALBUMIN 1.9* 1.6*    Recent Labs Lab 03/01/16 1804  LIPASE 10*   No results for input(s): AMMONIA in the last 168 hours. CBC:  Recent Labs Lab 03/01/16 1804 03/02/16 0543 03/02/16 0927 03/02/16 1548 03/03/16 0251 03/04/16 0345  WBC 5.6 4.4 4.2 5.8 5.0 5.3  NEUTROABS 3.9 2.1  --   --   --  2.3  HGB 7.8* 6.5* 6.7* 7.6* 7.6* 7.2*  HCT 22.3* 18.9* 19.5* 21.9* 22.0* 20.5*  MCV 86.8 85.9 86.3 84.6 84.3 84.0  PLT 237 215 218 210 206 208   Cardiac Enzymes: No results for input(s): CKTOTAL, CKMB, CKMBINDEX, TROPONINI in the last 168 hours. BNP: BNP (last 3 results) No results for input(s): BNP in the last 8760 hours.  ProBNP (last 3 results) No results for input(s):  PROBNP in the last 8760 hours.  CBG:  Recent Labs Lab 03/03/16 0807 03/03/16 1315 03/03/16 1734 03/03/16 2016 03/04/16 0744  GLUCAP 316* 143* 77 128* 136*       Signed:  Nita Sells MD   [facility] 03/04/2016, 8:16 AM

## 2016-03-20 NOTE — Telephone Encounter (Signed)
Pt said she has not had 3 days in a row when she can come in to start on this pump.  She promised to check her schedule when she leaves school to day and call me Monday to set up an appt. For the pump start

## 2016-04-12 ENCOUNTER — Other Ambulatory Visit: Payer: Self-pay | Admitting: Family

## 2016-07-01 ENCOUNTER — Ambulatory Visit (HOSPITAL_COMMUNITY)
Admission: RE | Admit: 2016-07-01 | Discharge: 2016-07-01 | Disposition: A | Discharge status: Home / Self Care | Payer: Medicaid Other | Source: Ambulatory Visit | Attending: Family Medicine | Admitting: Family Medicine

## 2016-07-01 DIAGNOSIS — N189 Chronic kidney disease, unspecified: Secondary | ICD-10-CM | POA: Diagnosis present

## 2016-07-01 DIAGNOSIS — D631 Anemia in chronic kidney disease: Secondary | ICD-10-CM | POA: Insufficient documentation

## 2016-07-01 LAB — ABO/RH: ABO/RH(D): O POS

## 2016-07-01 LAB — PREPARE RBC (CROSSMATCH)

## 2016-07-01 MED ORDER — SODIUM CHLORIDE 0.9 % IV SOLN
Freq: Once | INTRAVENOUS | Status: AC
  Administered 2016-07-01: 13:00:00 via INTRAVENOUS

## 2016-07-01 NOTE — Discharge Instructions (Signed)
Blood Transfusion, Adult, Care After This sheet gives you information about how to care for yourself after your procedure. Your health care provider may also give you more specific instructions. If you have problems or questions, contact your health care provider. What can I expect after the procedure? After your procedure, it is common to have:  Bruising and soreness where the IV tube was inserted.  Headache. Follow these instructions at home:  Take over-the-counter and prescription medicines only as told by your health care provider.  Return to your normal activities as told by your health care provider.  Follow instructions from your health care provider about how to take care of your IV insertion site. Make sure you:  Wash your hands with soap and water before you change your bandage (dressing). If soap and water are not available, use hand sanitizer.  Change your dressing as told by your health care provider.  Check your IV insertion site every day for signs of infection. Check for:  More redness, swelling, or pain.  More fluid or blood.  Warmth.  Pus or a bad smell. Contact a health care provider if:  You have more redness, swelling, or pain around the IV insertion site.  You have more fluid or blood coming from the IV insertion site.  Your IV insertion site feels warm to the touch.  You have pus or a bad smell coming from the IV insertion site.  Your urine turns pink, red, or brown.  You feel weak after doing your normal activities. Get help right away if:  You have signs of a serious allergic or immune system reaction, including:  Itchiness.  Hives.  Trouble breathing.  Anxiety.  Chest or lower back pain.  Fever, flushing, and chills.  Rapid pulse.  Rash.  Diarrhea.  Vomiting.  Dark urine.  Serious headache.  Dizziness.  Stiff neck.  Yellow coloration of the face or the white parts of the eyes (jaundice). This information is not  intended to replace advice given to you by your health care provider. Make sure you discuss any questions you have with your health care provider. Document Released: 02/18/2014 Document Revised: 09/27/2015 Document Reviewed: 08/14/2015 Elsevier Interactive Patient Education  2017 Elsevier Inc.  

## 2016-07-01 NOTE — Progress Notes (Signed)
Diagnosis: Anemia D63.1  Provider: Dr. Marval Regal  Procedure: Pt received 1 unit of PRBCs  Pt tolerated procedure well.  Post procedure: Pt alert, oriented and ambulatory to home at discharge.  D/C instructions given with verbal understanding.

## 2016-07-02 LAB — TYPE AND SCREEN
ABO/RH(D): O POS
ANTIBODY SCREEN: NEGATIVE
UNIT DIVISION: 0

## 2016-07-02 LAB — BPAM RBC
BLOOD PRODUCT EXPIRATION DATE: 201806122359
ISSUE DATE / TIME: 201805211231
UNIT TYPE AND RH: 5100

## 2016-07-22 ENCOUNTER — Other Ambulatory Visit: Payer: Self-pay | Admitting: Obstetrics and Gynecology

## 2016-07-25 LAB — CYTOLOGY - PAP

## 2016-08-01 ENCOUNTER — Other Ambulatory Visit (HOSPITAL_COMMUNITY): Payer: Self-pay | Admitting: *Deleted

## 2016-08-02 ENCOUNTER — Encounter (HOSPITAL_COMMUNITY)
Admission: RE | Admit: 2016-08-02 | Discharge: 2016-08-02 | Disposition: A | Discharge status: Home / Self Care | Payer: Medicaid Other | Source: Ambulatory Visit | Attending: Nephrology | Admitting: Nephrology

## 2016-08-02 DIAGNOSIS — D631 Anemia in chronic kidney disease: Secondary | ICD-10-CM | POA: Insufficient documentation

## 2016-08-02 DIAGNOSIS — N183 Chronic kidney disease, stage 3 unspecified: Secondary | ICD-10-CM

## 2016-08-02 DIAGNOSIS — D638 Anemia in other chronic diseases classified elsewhere: Secondary | ICD-10-CM

## 2016-08-02 LAB — RENAL FUNCTION PANEL
Albumin: 2.2 g/dL — ABNORMAL LOW (ref 3.5–5.0)
Anion gap: 5 (ref 5–15)
BUN: 28 mg/dL — ABNORMAL HIGH (ref 6–20)
CO2: 22 mmol/L (ref 22–32)
Calcium: 8.5 mg/dL — ABNORMAL LOW (ref 8.9–10.3)
Chloride: 115 mmol/L — ABNORMAL HIGH (ref 101–111)
Creatinine, Ser: 3.95 mg/dL — ABNORMAL HIGH (ref 0.44–1.00)
GFR, EST AFRICAN AMERICAN: 17 mL/min — AB (ref >60)
GFR, EST NON AFRICAN AMERICAN: 14 mL/min — AB (ref >60)
Glucose, Bld: 71 mg/dL (ref 65–99)
POTASSIUM: 4.2 mmol/L (ref 3.5–5.1)
Phosphorus: 4.1 mg/dL (ref 2.5–4.6)
Sodium: 142 mmol/L (ref 135–145)

## 2016-08-02 LAB — CBC
HCT: 22.4 % — ABNORMAL LOW (ref 36.0–46.0)
Hemoglobin: 7.8 g/dL — ABNORMAL LOW (ref 12.0–15.0)
MCH: 30.6 pg (ref 26.0–34.0)
MCHC: 34.8 g/dL (ref 30.0–36.0)
MCV: 87.8 fL (ref 78.0–100.0)
PLATELETS: 396 10*3/uL (ref 150–400)
RBC: 2.55 MIL/uL — ABNORMAL LOW (ref 3.87–5.11)
RDW: 12.2 % (ref 11.5–15.5)
WBC: 10.3 10*3/uL (ref 4.0–10.5)

## 2016-08-02 LAB — IRON AND TIBC
IRON: 118 ug/dL (ref 28–170)
Saturation Ratios: 50 % — ABNORMAL HIGH (ref 10.4–31.8)
TIBC: 238 ug/dL — AB (ref 250–450)
UIBC: 120 ug/dL

## 2016-08-02 LAB — PHOSPHORUS: PHOSPHORUS: 4.2 mg/dL (ref 2.5–4.6)

## 2016-08-02 LAB — FERRITIN: Ferritin: 1040 ng/mL — ABNORMAL HIGH (ref 11–307)

## 2016-08-02 MED ORDER — EPOETIN ALFA 20000 UNIT/ML IJ SOLN
INTRAMUSCULAR | Status: AC
  Filled 2016-08-02: qty 1

## 2016-08-02 MED ORDER — EPOETIN ALFA 20000 UNIT/ML IJ SOLN
20000.0000 [IU] | INTRAMUSCULAR | Status: DC
  Administered 2016-08-02: 20000 [IU] via SUBCUTANEOUS

## 2016-08-16 ENCOUNTER — Encounter (HOSPITAL_COMMUNITY)
Admission: RE | Admit: 2016-08-16 | Discharge: 2016-08-16 | Disposition: A | Discharge status: Home / Self Care | Payer: Medicaid Other | Source: Ambulatory Visit | Attending: Nephrology | Admitting: Nephrology

## 2016-08-16 DIAGNOSIS — N183 Chronic kidney disease, stage 3 unspecified: Secondary | ICD-10-CM

## 2016-08-16 DIAGNOSIS — D631 Anemia in chronic kidney disease: Secondary | ICD-10-CM | POA: Diagnosis present

## 2016-08-16 DIAGNOSIS — D638 Anemia in other chronic diseases classified elsewhere: Secondary | ICD-10-CM

## 2016-08-16 LAB — POCT HEMOGLOBIN-HEMACUE: Hemoglobin: 7.8 g/dL — ABNORMAL LOW (ref 12.0–15.0)

## 2016-08-16 MED ORDER — EPOETIN ALFA 20000 UNIT/ML IJ SOLN
INTRAMUSCULAR | Status: AC
  Administered 2016-08-16: 20000 [IU] via SUBCUTANEOUS
  Filled 2016-08-16: qty 1

## 2016-08-16 MED ORDER — EPOETIN ALFA 20000 UNIT/ML IJ SOLN
20000.0000 [IU] | INTRAMUSCULAR | Status: DC
  Administered 2016-08-16: 20000 [IU] via SUBCUTANEOUS

## 2016-08-16 NOTE — Progress Notes (Signed)
Hemocue 7.8 today and was 7.8 two weeks ago.  Pt denies Chest pain or seeing any bleeding.  Reported all of the above to [place] at [facility] and no new orders received.

## 2016-08-26 ENCOUNTER — Telehealth: Payer: Self-pay | Admitting: Family Medicine

## 2016-08-26 NOTE — Telephone Encounter (Signed)
Please advise if okay to refill? Last OV was 01/2016 and I do not see a note for a follow up visit.

## 2016-08-26 NOTE — Telephone Encounter (Signed)
**  Remind patient they can make refill requests via MyChart**  Medication refill request (Name & Dosage):  Glucometer Contour Next meter  Preferred pharmacy (Name & Address):  [facility] [address]  Other comments (if applicable):

## 2016-08-26 NOTE — Telephone Encounter (Signed)
Patient needs to be scheduled for follow up before refilling prescriptions

## 2016-08-26 NOTE — Telephone Encounter (Signed)
Called patient and left a voice message that she needs to call us back to make a follow up appointment before we can refill this prescription per Dr. Dwyane Dee.

## 2016-08-30 ENCOUNTER — Encounter (HOSPITAL_COMMUNITY)
Admission: RE | Admit: 2016-08-30 | Discharge: 2016-08-30 | Disposition: A | Discharge status: Home / Self Care | Payer: Medicaid Other | Source: Ambulatory Visit | Attending: Nephrology | Admitting: Nephrology

## 2016-08-30 DIAGNOSIS — N183 Chronic kidney disease, stage 3 unspecified: Secondary | ICD-10-CM

## 2016-08-30 DIAGNOSIS — D638 Anemia in other chronic diseases classified elsewhere: Secondary | ICD-10-CM

## 2016-08-30 LAB — CBC
HCT: 25.7 % — ABNORMAL LOW (ref 36.0–46.0)
Hemoglobin: 8.9 g/dL — ABNORMAL LOW (ref 12.0–15.0)
MCH: 30.2 pg (ref 26.0–34.0)
MCHC: 34.6 g/dL (ref 30.0–36.0)
MCV: 87.1 fL (ref 78.0–100.0)
PLATELETS: 303 10*3/uL (ref 150–400)
RBC: 2.95 MIL/uL — AB (ref 3.87–5.11)
RDW: 13.2 % (ref 11.5–15.5)
WBC: 6.7 10*3/uL (ref 4.0–10.5)

## 2016-08-30 LAB — PHOSPHORUS: PHOSPHORUS: 4.8 mg/dL — AB (ref 2.5–4.6)

## 2016-08-30 MED ORDER — EPOETIN ALFA 20000 UNIT/ML IJ SOLN
20000.0000 [IU] | INTRAMUSCULAR | Status: DC
  Administered 2016-08-30: 20000 [IU] via SUBCUTANEOUS

## 2016-08-30 MED ORDER — EPOETIN ALFA 20000 UNIT/ML IJ SOLN
INTRAMUSCULAR | Status: AC
  Administered 2016-08-30: 20000 [IU] via SUBCUTANEOUS
  Filled 2016-08-30: qty 1

## 2016-09-03 ENCOUNTER — Other Ambulatory Visit: Payer: Self-pay | Admitting: Endocrinology

## 2016-09-03 DIAGNOSIS — E1065 Type 1 diabetes mellitus with hyperglycemia: Secondary | ICD-10-CM

## 2016-09-04 ENCOUNTER — Other Ambulatory Visit (INDEPENDENT_AMBULATORY_CARE_PROVIDER_SITE_OTHER): Payer: Medicaid Other

## 2016-09-04 DIAGNOSIS — E1065 Type 1 diabetes mellitus with hyperglycemia: Secondary | ICD-10-CM

## 2016-09-04 LAB — MICROALBUMIN / CREATININE URINE RATIO
Creatinine,U: 103.7 mg/dL
Microalb Creat Ratio: 90.6 mg/g — ABNORMAL HIGH (ref 0.0–30.0)
Microalb, Ur: 93.9 mg/dL — ABNORMAL HIGH (ref 0.0–1.9)

## 2016-09-04 LAB — LIPID PANEL
CHOLESTEROL: 197 mg/dL (ref 0–200)
HDL: 82.7 mg/dL (ref >39.00)
LDL Cholesterol: 103 mg/dL — ABNORMAL HIGH (ref 0–99)
NonHDL: 114.03
TRIGLYCERIDES: 54 mg/dL (ref 0.0–149.0)
Total CHOL/HDL Ratio: 2
VLDL: 10.8 mg/dL (ref 0.0–40.0)

## 2016-09-04 LAB — HEMOGLOBIN A1C: HEMOGLOBIN A1C: 7.8 % — AB (ref 4.6–6.5)

## 2016-09-04 LAB — GLUCOSE, RANDOM: GLUCOSE: 129 mg/dL — AB (ref 70–99)

## 2016-09-06 ENCOUNTER — Other Ambulatory Visit: Payer: Self-pay

## 2016-09-06 DIAGNOSIS — Z01810 Encounter for preprocedural cardiovascular examination: Secondary | ICD-10-CM

## 2016-09-06 DIAGNOSIS — N185 Chronic kidney disease, stage 5: Secondary | ICD-10-CM

## 2016-09-09 ENCOUNTER — Ambulatory Visit (INDEPENDENT_AMBULATORY_CARE_PROVIDER_SITE_OTHER): Payer: Medicaid Other | Admitting: Endocrinology

## 2016-09-09 ENCOUNTER — Encounter: Payer: Self-pay | Admitting: Endocrinology

## 2016-09-09 ENCOUNTER — Other Ambulatory Visit: Payer: Self-pay

## 2016-09-09 VITALS — BP 120/78 | HR 72 | Ht 65.0 in | Wt 149.4 lb

## 2016-09-09 DIAGNOSIS — E1021 Type 1 diabetes mellitus with diabetic nephropathy: Secondary | ICD-10-CM

## 2016-09-09 DIAGNOSIS — E1065 Type 1 diabetes mellitus with hyperglycemia: Secondary | ICD-10-CM | POA: Diagnosis not present

## 2016-09-09 MED ORDER — GLUCOSE BLOOD VI STRP: ORAL_STRIP | 3 refills | Status: DC

## 2016-09-09 MED ORDER — ACCU-CHEK AVIVA PLUS W/DEVICE KIT: PACK | 1 refills | Status: DC

## 2016-09-09 NOTE — Patient Instructions (Signed)
Divide Carbs by 8   Levemir 11 in am and 10 and 10 at dinner

## 2016-09-09 NOTE — Progress Notes (Signed)
Patient ID: Christine Reed, female   DOB: 1985/05/10, 31 y.o.   MRN: [id number]           Reason for Appointment : Follow-up for Type 1 Diabetes  History of Present Illness          Diagnosis: Type 1 diabetes mellitus, date of diagnosis: 2000         Previous history:   She was initially started on NPH and Regular Insulin and subsequently was on Lantus and Humalog Usually has had poor control but records are not available for the last few years from her previous endocrinologist In April her Lantus was changed Novolin N presumably because of tendency to low sugars during the night  Recent history:   INSULIN regimen is: Novolog at meals carbohydrate coverage 1:10, correction factor 1 units per 50 mg over 150 Levemir 10-morning and 10 units in evening at bedtime  She has not been seen in follow-up for the last 8 months  A1c is 7.8, similar to her previous level  Current management, blood sugar patterns and problems identified:    She has checked her blood sugars with her family members monitor and cannot identify her blood sugars on this  She is not having a meter of her own as she lost it and did not ask for another one  She was told to start the Medtronic insulin pump that she has on her last visit but she did not want to do this and does not want to consider an insulin pump at this time  Not clear how often she is checking her blood sugars  HIGHEST blood sugars are reportedly after supper at bedtime when she does not know why as she does not have any unusual dietary habits at dinnertime or later  She is taking her insulin very consistently  She thinks her blood sugars are not getting low and only if she is skipping her lunch occasionally, she thinks she can have symptoms when blood sugars get low although previously would have blood sugars as low as 35   Glucose monitoring:  is being done 3-5 times a day         Glucometer:  Contour Blood Glucose readings from recall  Mean  values apply above for all meters except median for One Touch  PRE-MEAL Fasting Lunch Dinner Bedtime Overall  Glucose range: 100-170 160 62-160 200-400   Mean/median:         Hypoglycemia:  occurs As above Factors causing hypoglycemia: Excessive insulin, sometimes increased activity Symptoms of hypoglycemia:Weakness Treatment of hypoglycemia: As above          Self-care: The diet that the patient has been following is: Carbohydrate counting Usually eating cream of wheat at breakfast, mostly salad at lunch and dinner is usually at 8-10 PM  Snacks usually are apple or crackers          Exercise: May go  dancing.  2 3x weekly           Dietician consultation: Most recent: 5/17 in the hospital .         CDE consultation:?  Wt Readings from Last 3 Encounters:  09/09/16 149 lb 6.4 oz (67.8 kg)  03/01/16 148 lb 9.6 oz (67.4 kg)  01/30/16 156 lb 4.8 oz (70.9 kg)    Diabetes labs:  Lab Results  Component Value Date   HGBA1C 7.8 (H) 09/04/2016   HGBA1C 7.9 (H) 01/17/2016   HGBA1C 6.2 09/29/2015   Lab Results  Component Value Date   MICROALBUR 93.9 (H) 09/04/2016   LDLCALC 103 (H) 09/04/2016   CREATININE 3.95 (H) 08/02/2016   Microalbumin ratio 5011 done in 2/17  Lab Results  Component Value Date   MICRALBCREAT 90.6 (H) 09/04/2016     Allergies as of 09/09/2016      Reactions   Iodine Rash   Nickel Rash      Medication List       Accurate as of 09/09/16  5:09 PM. Always use your most recent med list.          ACCU-CHEK AVIVA PLUS w/Device Kit Use to check blood sugars 5 times daily   acetaminophen 500 MG tablet Commonly known as:  TYLENOL Take 500 mg by mouth every 6 (six) hours as needed for mild pain or headache.   amLODipine 10 MG tablet Commonly known as:  NORVASC TAKE ONE TABLET BY MOUTH ONCE DAILY   diphenhydrAMINE 25 MG tablet Commonly known as:  BENADRYL Take 25 mg by mouth every 6 (six) hours as needed for itching or allergies.   furosemide 20  MG tablet Commonly known as:  LASIX Take 1 tablet (20 mg total) by mouth every other day.   glucose blood test strip Commonly known as:  ACCU-CHEK AVIVA PLUS Use to check blood sugars 5 times daily.   Insulin Detemir 100 UNIT/ML Pen Commonly known as:  LEVEMIR Inject 4 units twice daily.   NOVOLOG 100 UNIT/ML injection Generic drug:  insulin aspart Inject 4-12 Units into the skin 3 (three) times daily before meals.       Allergies:  Allergies  Allergen Reactions  . Iodine Rash  . Nickel Rash    Past Medical History:  Diagnosis Date  . Anemia   . Chronic kidney disease   . Diabetes mellitus    diagnosed at age 61  . Diabetic retinopathy ([facility])   . Hypertension     Past Surgical History:  Procedure Laterality Date  . CESAREAN SECTION N/A 10/19/2015   Procedure: CESAREAN SECTION;  Surgeon: Chancy Milroy, MD;  Location: [facility];  Service: Obstetrics;  Laterality: N/A;  . lipo suction  2015  . TONSILLECTOMY    . TONSILLECTOMY AND ADENOIDECTOMY      Family History  Problem Relation Age of Onset  . Cancer Maternal Grandmother        colon and breast  . Diabetes Maternal Grandfather   . Diabetes Mother   . Diabetes Father   . Diabetes Paternal Grandmother   . Diabetes Paternal Grandfather     Social History:  reports that she has never smoked. She has never used smokeless tobacco. She reports that she does not drink alcohol or use drugs.      Review of Systems   She has renal insufficiency and nephrotic syndrome followed by nephrologist She has been told that she will need dialysis now  LABS:  Lab on 09/04/2016  Component Date Value Ref Range Status  . Hgb A1c MFr Bld 09/04/2016 7.8* 4.6 - 6.5 % Final   Glycemic Control Guidelines for People with Diabetes:Non Diabetic:  <6%Goal of Therapy: <7%Additional Action Suggested:  >8%   . Glucose, Bld 09/04/2016 129* 70 - 99 mg/dL Final  . Cholesterol 09/04/2016 197  0 - 200 mg/dL Final   ATP III  Classification       Desirable:  < 200 mg/dL               Borderline High:  200 -  239 mg/dL          High:  > = 240 mg/dL  . Triglycerides 09/04/2016 54.0  0.0 - 149.0 mg/dL Final   Normal:  <150 mg/dLBorderline High:  150 - 199 mg/dL  . HDL 09/04/2016 82.70  >39.00 mg/dL Final  . VLDL 09/04/2016 10.8  0.0 - 40.0 mg/dL Final  . LDL Cholesterol 09/04/2016 103* 0 - 99 mg/dL Final  . Total CHOL/HDL Ratio 09/04/2016 2   Final                  Men          Women1/2 Average Risk     3.4          3.3Average Risk          5.0          4.42X Average Risk          9.6          7.13X Average Risk          15.0          11.0                      . NonHDL 09/04/2016 114.03   Final   NOTE:  Non-HDL goal should be 30 mg/dL higher than patient's LDL goal (i.e. LDL goal of < 70 mg/dL, would have non-HDL goal of < 100 mg/dL)  . Microalb, Ur 09/04/2016 93.9* 0.0 - 1.9 mg/dL Final  . Creatinine,U 09/04/2016 103.7  mg/dL Final  . Microalb Creat Ratio 09/04/2016 90.6* 0.0 - 30.0 mg/g Final    Physical Examination:  BP 120/78   Pulse 72   Ht '5\' 5"'$  (1.651 m)   Wt 149 lb 6.4 oz (67.8 kg)   BMI 24.86 kg/m   Diabetic Foot Exam - Simple   Simple Foot Form Diabetic Foot exam was performed with the following findings:  Yes   Visual Inspection No deformities, no ulcerations, no other skin breakdown bilaterally:  Yes Sensation Testing Intact to touch and monofilament testing bilaterally:  Yes Pulse Check Posterior Tibialis and Dorsalis pulse intact bilaterally:  Yes Comments     ASSESSMENT:  Diabetes type 1 With labile blood sugars See history of present illness for detailed discussion of current diabetes management, blood sugar patterns and problems identified  Her A1c is still nearly 8% She has not brought her monitor and is currently not using a glucose monitor that is her own She claims that her blood sugars are fairly good except after supper but doubt if they are consistent Not clear if she is  getting only a short duration of action of Levemir in the morning since blood sugars are much higher late in the evening She may not be getting enough coverage for her evening meal also Although she is counting carbohydrates not clear how accurate she is with this   PLAN:    Start checking blood sugars 4 times a day, will start with a Accu-Chek meter  Also she can check coverage for the freestyle Libre systems  Discussed in detail the benefits of using a closed loop insulin pump systems such as the 670 Medtronic and discussed how this works Today discussed that we need to get her blood sugars better controlled and consistently because of her progressive renal failure also  She was given a brochure on the 6 70 pump and she will call to get the insurance coverage verified  Explained  to her that it's worthwhile using the pump compared to insulin injections especially for long-term improvement in her control and prevention of complications  In the meantime she will need to increase her Levemir to 11 units in the morning  She can try taking her evening dose at dinnertime  She will need to bring her monitor with blood sugars at the times indicated for download on each visit  She needs to try covering her evening meal with the 1:8 ratio  Patient Instructions  Divide Carbs by 8   Levemir 11 in am and 10 and 10 at dinner   Counseling time on subjects discussed above is over 50% of today's 25 minute visit   Jaclene Bartelt 09/09/2016, 5:09 PM   Note: This note was prepared with Dragon voice recognition system technology. Any transcriptional errors that result from this process are unintentional.

## 2016-09-17 ENCOUNTER — Encounter (HOSPITAL_COMMUNITY)
Admission: RE | Admit: 2016-09-17 | Discharge: 2016-09-17 | Disposition: A | Discharge status: Home / Self Care | Payer: Medicaid Other | Source: Ambulatory Visit | Attending: Nephrology | Admitting: Nephrology

## 2016-09-17 DIAGNOSIS — D638 Anemia in other chronic diseases classified elsewhere: Secondary | ICD-10-CM

## 2016-09-17 DIAGNOSIS — N183 Chronic kidney disease, stage 3 unspecified: Secondary | ICD-10-CM

## 2016-09-17 DIAGNOSIS — D631 Anemia in chronic kidney disease: Secondary | ICD-10-CM | POA: Insufficient documentation

## 2016-09-17 LAB — COMPREHENSIVE METABOLIC PANEL
ALK PHOS: 71 U/L (ref 38–126)
ALT: 26 U/L (ref 14–54)
ANION GAP: 7 (ref 5–15)
AST: 26 U/L (ref 15–41)
Albumin: 2.2 g/dL — ABNORMAL LOW (ref 3.5–5.0)
BUN: 35 mg/dL — AB (ref 6–20)
CO2: 18 mmol/L — ABNORMAL LOW (ref 22–32)
CREATININE: 4.71 mg/dL — AB (ref 0.44–1.00)
Calcium: 8.5 mg/dL — ABNORMAL LOW (ref 8.9–10.3)
Chloride: 114 mmol/L — ABNORMAL HIGH (ref 101–111)
GFR, EST AFRICAN AMERICAN: 14 mL/min — AB (ref >60)
GFR, EST NON AFRICAN AMERICAN: 12 mL/min — AB (ref >60)
GLUCOSE: 147 mg/dL — AB (ref 65–99)
Potassium: 4.1 mmol/L (ref 3.5–5.1)
SODIUM: 139 mmol/L (ref 135–145)
Total Bilirubin: 0.5 mg/dL (ref 0.3–1.2)
Total Protein: 5.4 g/dL — ABNORMAL LOW (ref 6.5–8.1)

## 2016-09-17 LAB — POCT HEMOGLOBIN-HEMACUE: Hemoglobin: 9.3 g/dL — ABNORMAL LOW (ref 12.0–15.0)

## 2016-09-17 MED ORDER — EPOETIN ALFA 20000 UNIT/ML IJ SOLN
INTRAMUSCULAR | Status: AC
  Administered 2016-09-17: 20000 [IU]
  Filled 2016-09-17: qty 1

## 2016-09-17 MED ORDER — EPOETIN ALFA 20000 UNIT/ML IJ SOLN: 20000.0000 [IU] | INTRAMUSCULAR | Status: DC

## 2016-09-18 ENCOUNTER — Encounter: Payer: Self-pay | Admitting: Surgery

## 2016-09-30 ENCOUNTER — Encounter: Payer: Self-pay | Admitting: Surgery

## 2016-09-30 ENCOUNTER — Ambulatory Visit (INDEPENDENT_AMBULATORY_CARE_PROVIDER_SITE_OTHER): Payer: Medicaid Other | Admitting: Surgery

## 2016-09-30 ENCOUNTER — Ambulatory Visit (HOSPITAL_COMMUNITY)
Admission: RE | Admit: 2016-09-30 | Discharge: 2016-09-30 | Disposition: A | Discharge status: Home / Self Care | Payer: Medicaid Other | Source: Ambulatory Visit | Attending: Surgery | Admitting: Surgery

## 2016-09-30 ENCOUNTER — Ambulatory Visit (INDEPENDENT_AMBULATORY_CARE_PROVIDER_SITE_OTHER)
Admission: RE | Admit: 2016-09-30 | Discharge: 2016-09-30 | Disposition: A | Discharge status: Home / Self Care | Payer: Medicaid Other | Source: Ambulatory Visit | Attending: Surgery | Admitting: Surgery

## 2016-09-30 VITALS — BP 155/101 | HR 93 | Ht 65.0 in | Wt 153.4 lb

## 2016-09-30 DIAGNOSIS — N185 Chronic kidney disease, stage 5: Secondary | ICD-10-CM | POA: Diagnosis present

## 2016-09-30 DIAGNOSIS — N184 Chronic kidney disease, stage 4 (severe): Secondary | ICD-10-CM | POA: Diagnosis not present

## 2016-09-30 DIAGNOSIS — Z01810 Encounter for preprocedural cardiovascular examination: Secondary | ICD-10-CM | POA: Insufficient documentation

## 2016-09-30 NOTE — Progress Notes (Signed)
 [facility]  Patient name: Christine Reed MRN: [id number] DOB: 04/19/1985 Sex: female   REQUESTING PROVIDER:    Dr. Coladonato   REASON FOR CONSULT:    HD access  HISTORY OF PRESENT ILLNESS:   Christine Reed is a 31 y.o. female, who is Referred today for evaluation of dialysis access.  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  She is right-handed.  PAST MEDICAL HISTORY    Past Medical History:  Diagnosis Date  . Anemia   . Chronic kidney disease   . Diabetes mellitus    diagnosed at age 13  . Diabetic retinopathy ([facility])   . Hypertension      FAMILY HISTORY   Family History  Problem Relation Age of Onset  . Cancer Maternal Grandmother        colon and breast  . Diabetes Maternal Grandfather   . Diabetes Mother   . Diabetes Father   . Diabetes Paternal Grandmother   . Diabetes Paternal Grandfather     SOCIAL HISTORY:   Social History   Social History  . Marital status: Single    Spouse name: N/A  . Number of children: N/A  . Years of education: N/A   Occupational History  . Not on file.   Social History Main Topics  . Smoking status: Never Smoker  . Smokeless tobacco: Never Used  . Alcohol use No  . Drug use: No  . Sexual activity: Not Currently   Other Topics Concern  . Not on file   Social History Narrative  . No narrative on file    ALLERGIES:    Allergies  Allergen Reactions  . Iodine Rash  . Nickel Rash    CURRENT MEDICATIONS:    Current Outpatient Prescriptions  Medication Sig Dispense Refill  . acetaminophen (TYLENOL) 500 MG tablet Take 500 mg by mouth every 6 (six) hours as needed for mild pain or headache.    . amLODipine (NORVASC) 10 MG tablet TAKE ONE TABLET BY MOUTH ONCE DAILY 30 tablet 0  . Blood Glucose Monitoring Suppl (ACCU-CHEK AVIVA PLUS) w/Device KIT Use to check blood sugars 5 times daily 1 kit 1  .  diphenhydrAMINE (BENADRYL) 25 MG tablet Take 25 mg by mouth every 6 (six) hours as needed for itching or allergies.     . furosemide (LASIX) 40 MG tablet Take 40 mg by mouth.    . glucose blood (ACCU-CHEK AVIVA PLUS) test strip Use to check blood sugars 5 times daily. 150 each 3  . insulin aspart (NOVOLOG) 100 UNIT/ML injection Inject 4-12 Units into the skin 3 (three) times daily before meals.    . Insulin Detemir (LEVEMIR) 100 UNIT/ML Pen Inject 4 units twice daily. (Patient taking differently: Inject 10 Units into the skin See admin instructions. Inject 10 units subcutaneously ever morning and 10 units at bedtime) 15 mL 4   No current facility-administered medications for this visit.     REVIEW OF SYSTEMS:   [X] denotes positive finding, [ ] denotes negative finding Cardiac  Comments:  Chest pain or chest pressure:    Shortness of breath upon exertion:    Short of breath when lying flat:    Irregular heart rhythm:        Vascular    Pain in calf, thigh, or hip brought on by ambulation:    Pain in feet at night that wakes you up from your sleep:       Blood clot in your veins:    Leg swelling:         Pulmonary    Oxygen at home:    Productive cough:     Wheezing:         Neurologic    Sudden weakness in arms or legs:     Sudden numbness in arms or legs:     Sudden onset of difficulty speaking or slurred speech:    Temporary loss of vision in one eye:     Problems with dizziness:         Gastrointestinal    Blood in stool:      Vomited blood:         Genitourinary    Burning when urinating:     Blood in urine:        Psychiatric    Major depression:         Hematologic    Bleeding problems:    Problems with blood clotting too easily:        Skin    Rashes or ulcers:        Constitutional    Fever or chills:     PHYSICAL EXAM:   Vitals:   09/30/16 1036  BP: (!) 155/101  Pulse: 93  SpO2: 100%  Weight: 153 lb 6.4 oz (69.6 kg)  Height: 5' 5" (1.651 m)     GENERAL: The patient is a well-nourished female, in no acute distress. The vital signs are documented above. CARDIAC: There is a regular rate and rhythm.  VASCULAR: Palpable right radial and brachial pulse PULMONARY: Nonlabored respirations MUSCULOSKELETAL: There are no major deformities or cyanosis. NEUROLOGIC: No focal weakness or paresthesias are detected. SKIN: There are no ulcers or rashes noted. PSYCHIATRIC: The patient has a normal affect.  STUDIES:   I have reviewed her vascular lab studies.  She has a high takeoff of the right radial artery in the axilla. Her best vein is the right cephalic vein with diameter measurements from 0.31-0.24  ASSESSMENT and PLAN   Chronic renal insufficiency: We discussed proceeded with a right brachiocephalic fistula.  Risks and benefits of procedure discussed with the patient clinically the risk of non-maturity, the risk of steal, at the need for future interventions.  Her procedure is been scheduled for Thursday, August 30   Wells Brabham, MD [facility] [place] Tel [phone] Pager [phone] 

## 2016-10-01 ENCOUNTER — Encounter (HOSPITAL_COMMUNITY)
Admission: RE | Admit: 2016-10-01 | Discharge: 2016-10-01 | Disposition: A | Discharge status: Home / Self Care | Payer: Medicaid Other | Source: Ambulatory Visit | Attending: Nephrology | Admitting: Nephrology

## 2016-10-01 DIAGNOSIS — N183 Chronic kidney disease, stage 3 unspecified: Secondary | ICD-10-CM

## 2016-10-01 DIAGNOSIS — D638 Anemia in other chronic diseases classified elsewhere: Secondary | ICD-10-CM

## 2016-10-01 LAB — COMPREHENSIVE METABOLIC PANEL
ALT: 38 U/L (ref 14–54)
ANION GAP: 6 (ref 5–15)
AST: 29 U/L (ref 15–41)
Albumin: 2.4 g/dL — ABNORMAL LOW (ref 3.5–5.0)
Alkaline Phosphatase: 68 U/L (ref 38–126)
BUN: 42 mg/dL — ABNORMAL HIGH (ref 6–20)
CO2: 20 mmol/L — ABNORMAL LOW (ref 22–32)
Calcium: 8.5 mg/dL — ABNORMAL LOW (ref 8.9–10.3)
Chloride: 111 mmol/L (ref 101–111)
Creatinine, Ser: 5.44 mg/dL — ABNORMAL HIGH (ref 0.44–1.00)
GFR calc Af Amer: 11 mL/min — ABNORMAL LOW (ref >60)
GFR, EST NON AFRICAN AMERICAN: 10 mL/min — AB (ref >60)
Glucose, Bld: 275 mg/dL — ABNORMAL HIGH (ref 65–99)
POTASSIUM: 4.5 mmol/L (ref 3.5–5.1)
Sodium: 137 mmol/L (ref 135–145)
TOTAL PROTEIN: 5.7 g/dL — AB (ref 6.5–8.1)
Total Bilirubin: 0.8 mg/dL (ref 0.3–1.2)

## 2016-10-01 LAB — FERRITIN: Ferritin: 186 ng/mL (ref 11–307)

## 2016-10-01 LAB — IRON AND TIBC
Iron: 76 ug/dL (ref 28–170)
SATURATION RATIOS: 28 % (ref 10.4–31.8)
TIBC: 267 ug/dL (ref 250–450)
UIBC: 191 ug/dL

## 2016-10-01 LAB — CBC
HCT: 29.5 % — ABNORMAL LOW (ref 36.0–46.0)
Hemoglobin: 10 g/dL — ABNORMAL LOW (ref 12.0–15.0)
MCH: 29.6 pg (ref 26.0–34.0)
MCHC: 33.9 g/dL (ref 30.0–36.0)
MCV: 87.3 fL (ref 78.0–100.0)
PLATELETS: 274 10*3/uL (ref 150–400)
RBC: 3.38 MIL/uL — ABNORMAL LOW (ref 3.87–5.11)
RDW: 13.7 % (ref 11.5–15.5)
WBC: 10.5 10*3/uL (ref 4.0–10.5)

## 2016-10-01 LAB — PHOSPHORUS: Phosphorus: 3.5 mg/dL (ref 2.5–4.6)

## 2016-10-01 MED ORDER — EPOETIN ALFA 20000 UNIT/ML IJ SOLN
INTRAMUSCULAR | Status: AC
  Filled 2016-10-01: qty 1

## 2016-10-01 MED ORDER — EPOETIN ALFA 20000 UNIT/ML IJ SOLN
20000.0000 [IU] | INTRAMUSCULAR | Status: DC
  Administered 2016-10-01: 20000 [IU] via SUBCUTANEOUS

## 2016-10-03 ENCOUNTER — Other Ambulatory Visit: Payer: Self-pay | Admitting: *Deleted

## 2016-10-08 ENCOUNTER — Encounter (HOSPITAL_COMMUNITY): Payer: Self-pay | Admitting: *Deleted

## 2016-10-08 NOTE — Progress Notes (Signed)
Pt denies SOB, chest pain, and being under the care of a cardiologist. Pt denies having a stress test, echo and cardiac cath. Pt denies having an EKG and chest x ray within the last year. Pt stated that MD advised that she take half dose of Levemir insulin ( 5 units) the night before surgery and no insulin DOS. Pt made aware to check BG every 2 hours prior to arrival to hospital on DOS. Pt made aware to treat a BG < 70 with 4 glucose tabs, wait 15 minutes after intervention to recheck BG, if BG remains < 70, call Short Stay unit to speak with a nurse. Pt made aware to stop taking otc vitamins, fish oil, herbal medications and NSAID's such as Advil, Aleve, Motrin, Ibuprofen, BC and Goody Powder. Pt verbalized understanding of all pre-op instructions.

## 2016-10-09 ENCOUNTER — Telehealth: Payer: Self-pay | Admitting: Nutrition

## 2016-10-09 NOTE — Telephone Encounter (Signed)
I phoned patient (3rd call for this), to see if she is ready to start her new pump.  She reported that she did not want to do pump therapy, and she is happy with injections.

## 2016-10-10 ENCOUNTER — Ambulatory Visit (HOSPITAL_COMMUNITY): Payer: Medicaid Other | Admitting: Anesthesiology

## 2016-10-10 ENCOUNTER — Encounter (HOSPITAL_COMMUNITY): Payer: Self-pay | Admitting: *Deleted

## 2016-10-10 ENCOUNTER — Encounter (HOSPITAL_COMMUNITY)
Admission: RE | Disposition: A | Discharge status: Home / Self Care | Payer: Self-pay | Source: Ambulatory Visit | Attending: Surgery

## 2016-10-10 ENCOUNTER — Ambulatory Visit (HOSPITAL_COMMUNITY)
Admission: RE | Admit: 2016-10-10 | Discharge: 2016-10-10 | Disposition: A | Discharge status: Home / Self Care | Payer: Medicaid Other | Source: Ambulatory Visit | Attending: Surgery | Admitting: Surgery

## 2016-10-10 DIAGNOSIS — K219 Gastro-esophageal reflux disease without esophagitis: Secondary | ICD-10-CM | POA: Diagnosis not present

## 2016-10-10 DIAGNOSIS — Z888 Allergy status to other drugs, medicaments and biological substances status: Secondary | ICD-10-CM | POA: Insufficient documentation

## 2016-10-10 DIAGNOSIS — Z794 Long term (current) use of insulin: Secondary | ICD-10-CM | POA: Diagnosis not present

## 2016-10-10 DIAGNOSIS — E1022 Type 1 diabetes mellitus with diabetic chronic kidney disease: Secondary | ICD-10-CM | POA: Diagnosis present

## 2016-10-10 DIAGNOSIS — N183 Chronic kidney disease, stage 3 unspecified: Secondary | ICD-10-CM

## 2016-10-10 DIAGNOSIS — I129 Hypertensive chronic kidney disease with stage 1 through stage 4 chronic kidney disease, or unspecified chronic kidney disease: Secondary | ICD-10-CM | POA: Insufficient documentation

## 2016-10-10 DIAGNOSIS — N184 Chronic kidney disease, stage 4 (severe): Secondary | ICD-10-CM | POA: Insufficient documentation

## 2016-10-10 DIAGNOSIS — N185 Chronic kidney disease, stage 5: Secondary | ICD-10-CM | POA: Diagnosis not present

## 2016-10-10 DIAGNOSIS — Z79899 Other long term (current) drug therapy: Secondary | ICD-10-CM | POA: Diagnosis not present

## 2016-10-10 DIAGNOSIS — Z91048 Other nonmedicinal substance allergy status: Secondary | ICD-10-CM | POA: Diagnosis not present

## 2016-10-10 DIAGNOSIS — E10319 Type 1 diabetes mellitus with unspecified diabetic retinopathy without macular edema: Secondary | ICD-10-CM | POA: Insufficient documentation

## 2016-10-10 HISTORY — PX: AV FISTULA PLACEMENT: SHX1204

## 2016-10-10 HISTORY — DX: Gastro-esophageal reflux disease without esophagitis: K21.9

## 2016-10-10 LAB — POCT I-STAT 4, (NA,K, GLUC, HGB,HCT)
Glucose, Bld: 164 mg/dL — ABNORMAL HIGH (ref 65–99)
HCT: 27 % — ABNORMAL LOW (ref 36.0–46.0)
Hemoglobin: 9.2 g/dL — ABNORMAL LOW (ref 12.0–15.0)
POTASSIUM: 3.9 mmol/L (ref 3.5–5.1)
SODIUM: 139 mmol/L (ref 135–145)

## 2016-10-10 LAB — HCG, SERUM, QUALITATIVE: PREG SERUM: NEGATIVE

## 2016-10-10 LAB — GLUCOSE, CAPILLARY: Glucose-Capillary: 184 mg/dL — ABNORMAL HIGH (ref 65–99)

## 2016-10-10 SURGERY — ARTERIOVENOUS (AV) FISTULA CREATION
Anesthesia: Monitor Anesthesia Care | Site: Arm Lower | Laterality: Right

## 2016-10-10 MED ORDER — FENTANYL CITRATE (PF) 100 MCG/2ML IJ SOLN
INTRAMUSCULAR | Status: DC | PRN
  Administered 2016-10-10 (×3): 25 ug via INTRAVENOUS
  Administered 2016-10-10 (×2): 50 ug via INTRAVENOUS

## 2016-10-10 MED ORDER — MIDAZOLAM HCL 2 MG/2ML IJ SOLN
INTRAMUSCULAR | Status: AC
  Filled 2016-10-10: qty 2

## 2016-10-10 MED ORDER — PHENYLEPHRINE HCL 10 MG/ML IJ SOLN
INTRAMUSCULAR | Status: DC | PRN
  Administered 2016-10-10: 120 ug via INTRAVENOUS

## 2016-10-10 MED ORDER — HEMOSTATIC AGENTS (NO CHARGE) OPTIME
TOPICAL | Status: DC | PRN
  Administered 2016-10-10: 1 via TOPICAL

## 2016-10-10 MED ORDER — LIDOCAINE-EPINEPHRINE (PF) 1 %-1:200000 IJ SOLN
INTRAMUSCULAR | Status: DC | PRN
  Administered 2016-10-10: 6.6 mL

## 2016-10-10 MED ORDER — MIDAZOLAM HCL 5 MG/5ML IJ SOLN
INTRAMUSCULAR | Status: DC | PRN
  Administered 2016-10-10: 2 mg via INTRAVENOUS

## 2016-10-10 MED ORDER — CHLORHEXIDINE GLUCONATE 4 % EX LIQD: 60.0000 mL | Freq: Once | CUTANEOUS | Status: DC

## 2016-10-10 MED ORDER — ONDANSETRON HCL 4 MG/2ML IJ SOLN
INTRAMUSCULAR | Status: AC
  Filled 2016-10-10: qty 2

## 2016-10-10 MED ORDER — HEPARIN SODIUM (PORCINE) 1000 UNIT/ML IJ SOLN
INTRAMUSCULAR | Status: AC
  Filled 2016-10-10: qty 1

## 2016-10-10 MED ORDER — PROPOFOL 1000 MG/100ML IV EMUL
INTRAVENOUS | Status: AC
  Filled 2016-10-10: qty 100

## 2016-10-10 MED ORDER — PROPOFOL 500 MG/50ML IV EMUL
INTRAVENOUS | Status: DC | PRN
  Administered 2016-10-10: 100 ug/kg/min via INTRAVENOUS

## 2016-10-10 MED ORDER — PROPOFOL 10 MG/ML IV BOLUS
INTRAVENOUS | Status: AC
  Filled 2016-10-10: qty 20

## 2016-10-10 MED ORDER — PROTAMINE SULFATE 10 MG/ML IV SOLN
INTRAVENOUS | Status: DC | PRN
  Administered 2016-10-10: 5 mg via INTRAVENOUS
  Administered 2016-10-10 (×2): 10 mg via INTRAVENOUS

## 2016-10-10 MED ORDER — OXYCODONE HCL 5 MG PO TABS: 5.0000 mg | ORAL_TABLET | Freq: Once | ORAL | Status: DC | PRN

## 2016-10-10 MED ORDER — OXYCODONE HCL 5 MG/5ML PO SOLN: 5.0000 mg | Freq: Once | ORAL | Status: DC | PRN

## 2016-10-10 MED ORDER — ONDANSETRON HCL 4 MG/2ML IJ SOLN: 4.0000 mg | Freq: Four times a day (QID) | INTRAMUSCULAR | Status: DC | PRN

## 2016-10-10 MED ORDER — HEPARIN SODIUM (PORCINE) 1000 UNIT/ML IJ SOLN
INTRAMUSCULAR | Status: DC | PRN
  Administered 2016-10-10: 3000 [IU] via INTRAVENOUS

## 2016-10-10 MED ORDER — DEXTROSE 5 % IV SOLN
1.5000 g | INTRAVENOUS | Status: AC
  Administered 2016-10-10: 1.5 g via INTRAVENOUS

## 2016-10-10 MED ORDER — SODIUM CHLORIDE 0.9 % IV SOLN
INTRAVENOUS | Status: DC | PRN
  Administered 2016-10-10: 500 mL

## 2016-10-10 MED ORDER — ONDANSETRON HCL 4 MG/2ML IJ SOLN
INTRAMUSCULAR | Status: DC | PRN
  Administered 2016-10-10: 4 mg via INTRAVENOUS

## 2016-10-10 MED ORDER — OXYCODONE HCL 5 MG PO TABS: 5.0000 mg | ORAL_TABLET | Freq: Four times a day (QID) | ORAL | 0 refills | Status: DC | PRN

## 2016-10-10 MED ORDER — FENTANYL CITRATE (PF) 100 MCG/2ML IJ SOLN
INTRAMUSCULAR | Status: AC
  Administered 2016-10-10: 50 ug via INTRAVENOUS
  Filled 2016-10-10: qty 2

## 2016-10-10 MED ORDER — 0.9 % SODIUM CHLORIDE (POUR BTL) OPTIME
TOPICAL | Status: DC | PRN
  Administered 2016-10-10: 1000 mL

## 2016-10-10 MED ORDER — FENTANYL CITRATE (PF) 100 MCG/2ML IJ SOLN
25.0000 ug | INTRAMUSCULAR | Status: DC | PRN
  Administered 2016-10-10: 50 ug via INTRAVENOUS

## 2016-10-10 MED ORDER — FENTANYL CITRATE (PF) 250 MCG/5ML IJ SOLN
INTRAMUSCULAR | Status: AC
  Filled 2016-10-10: qty 5

## 2016-10-10 MED ORDER — LIDOCAINE-EPINEPHRINE (PF) 1 %-1:200000 IJ SOLN
INTRAMUSCULAR | Status: AC
  Filled 2016-10-10: qty 30

## 2016-10-10 MED ORDER — DEXTROSE 5 % IV SOLN
INTRAVENOUS | Status: AC
  Filled 2016-10-10: qty 1.5

## 2016-10-10 MED ORDER — SODIUM CHLORIDE 0.9 % IV SOLN
INTRAVENOUS | Status: DC
  Administered 2016-10-10: 07:00:00 via INTRAVENOUS

## 2016-10-10 MED ORDER — LIDOCAINE 2% (20 MG/ML) 5 ML SYRINGE
INTRAMUSCULAR | Status: AC
  Filled 2016-10-10: qty 5

## 2016-10-10 SURGICAL SUPPLY — 32 items
ADH SKN CLS APL DERMABOND .7 (GAUZE/BANDAGES/DRESSINGS) ×1
ARMBAND PINK RESTRICT EXTREMIT (MISCELLANEOUS) ×2 IMPLANT
CANISTER SUCT 3000ML PPV (MISCELLANEOUS) ×2 IMPLANT
CLIP VESOCCLUDE MED 6/CT (CLIP) ×2 IMPLANT
CLIP VESOCCLUDE SM WIDE 6/CT (CLIP) ×2 IMPLANT
COVER PROBE W GEL 5X96 (DRAPES) ×2 IMPLANT
DERMABOND ADVANCED (GAUZE/BANDAGES/DRESSINGS) ×1
DERMABOND ADVANCED .7 DNX12 (GAUZE/BANDAGES/DRESSINGS) ×1 IMPLANT
ELECT REM PT RETURN 9FT ADLT (ELECTROSURGICAL) ×2
ELECTRODE REM PT RTRN 9FT ADLT (ELECTROSURGICAL) ×1 IMPLANT
GLOVE BIO SURGEON STRL SZ 6.5 (GLOVE) ×3 IMPLANT
GLOVE BIOGEL PI IND STRL 6.5 (GLOVE) IMPLANT
GLOVE BIOGEL PI IND STRL 7.5 (GLOVE) ×1 IMPLANT
GLOVE BIOGEL PI INDICATOR 6.5 (GLOVE) ×4
GLOVE BIOGEL PI INDICATOR 7.5 (GLOVE) ×1
GLOVE SURG SS PI 7.5 STRL IVOR (GLOVE) ×2 IMPLANT
GOWN STRL REUS W/ TWL LRG LVL3 (GOWN DISPOSABLE) ×2 IMPLANT
GOWN STRL REUS W/ TWL XL LVL3 (GOWN DISPOSABLE) ×1 IMPLANT
GOWN STRL REUS W/TWL LRG LVL3 (GOWN DISPOSABLE) ×6
GOWN STRL REUS W/TWL XL LVL3 (GOWN DISPOSABLE) ×2
HEMOSTAT SNOW SURGICEL 2X4 (HEMOSTASIS) ×1 IMPLANT
KIT BASIN OR (CUSTOM PROCEDURE TRAY) ×2 IMPLANT
KIT ROOM TURNOVER OR (KITS) ×2 IMPLANT
NS IRRIG 1000ML POUR BTL (IV SOLUTION) ×2 IMPLANT
PACK CV ACCESS (CUSTOM PROCEDURE TRAY) ×2 IMPLANT
PAD ARMBOARD 7.5X6 YLW CONV (MISCELLANEOUS) ×4 IMPLANT
SUT PROLENE 6 0 CC (SUTURE) ×3 IMPLANT
SUT VIC AB 3-0 SH 27 (SUTURE) ×2
SUT VIC AB 3-0 SH 27X BRD (SUTURE) ×1 IMPLANT
SUT VICRYL 4-0 PS2 18IN ABS (SUTURE) ×2 IMPLANT
UNDERPAD 30X30 (UNDERPADS AND DIAPERS) ×2 IMPLANT
WATER STERILE IRR 1000ML POUR (IV SOLUTION) ×2 IMPLANT

## 2016-10-10 NOTE — Op Note (Signed)
    Patient name: Christine Reed MRN: [id number] DOB: December 22, 1985 Sex: female  10/10/2016 Pre-operative Diagnosis: CKD Post-operative diagnosis:  Same Surgeon:  Annamarie Major Assistants:  S. Rhyne Procedure:   Right upper arm radiocephalic fistula Anesthesia:  Mac Blood Loss:  See anesthesia record Specimens:  None  Findings:  3 mm vein.  I used the right radial artery at the antecubital crease.  The brachial artery was deep and I did not like the position for fistula creation  Indications:  Patient comes in today for dialysis access.  Procedure:  The patient was identified in the holding area and taken to [facility]  The patient was then placed supine on the table. MAC anesthesia was administered.  The patient was prepped and draped in the usual sterile fashion.  A time out was called and antibiotics were administered.  Ultrasound was used to evaluate the cephalic vein.  It appeared to be of adequate caliber.  The radial artery and brachial artery were over top of each other in the antecubital space.  They appeared to be of the same diameter on ultrasound.  Next, a transverse incision was made at the antecubital crease.  I dissected out the cephalic vein.  This was a scarred and 3 mm vein but appeared to be healthy.  I then dissected out the radial artery.  This was a 2.5 mm artery.  I then exposed the brachial artery deeper to this.  I did not like the depth of the artery and the tunnel the fistula would have to create and therefore I elected to use a radial artery.  3000 units of heparin were given.  The vein was marked for orientation and ligated distally with 3-0 silk tie.  The radial artery was then occluded with Serafin clamps.  A #11 blade was used to make an arteriotomy which was extended longitudinally with Potts scissors.  The vein was cut the appropriate length and spatulated to fit the size of the arteriotomy.  A running anastomosis was created with 6-0 Prolene.  Prior to completion,  the appropriate flushing maneuvers were performed and the anastomosis was completed.  The patient had a good thrill within the fistula.  25 mg of protamine was given.  The incision was closed with 2 layers of 3-0 Vicryl followed by Dermabond.   Disposition:  To PACU stable   V. Annamarie Major, M.D. [facility] [place] Office: [phone] Pager:  [phone]

## 2016-10-10 NOTE — Anesthesia Postprocedure Evaluation (Signed)
Anesthesia Post Note  Patient: Christine Reed  Procedure(s) Performed: Procedure(s) (LRB): CREATION OF RIGHT ARM RADIOCEPHALIC ARTERIOVENOUS (AV) FISTULA (Right)     Patient location during evaluation: PACU Anesthesia Type: MAC Level of consciousness: awake and alert Pain management: pain level controlled Vital Signs Assessment: post-procedure vital signs reviewed and stable Respiratory status: spontaneous breathing, nonlabored ventilation, respiratory function stable and patient connected to nasal cannula oxygen Cardiovascular status: stable and blood pressure returned to baseline Postop Assessment: no signs of nausea or vomiting Anesthetic complications: no    Last Vitals:  Vitals:   10/10/16 1029 10/10/16 1036  BP:  128/84  Pulse: 89 90  Resp:  14  Temp:    SpO2: 100% 100%    Last Pain:  Vitals:   10/10/16 1036  TempSrc:   PainSc: [place]

## 2016-10-10 NOTE — Interval H&P Note (Signed)
History and Physical Interval Note:  10/10/2016 7:25 AM  Christine Reed  has presented today for surgery, with the diagnosis of chronic kidney disease stage 5  The various methods of treatment have been discussed with the patient and family. After consideration of risks, benefits and other options for treatment, the patient has consented to  Procedure(s): ARTERIOVENOUS (AV) FISTULA CREATION RIGHT ARM (Right) as a surgical intervention .  The patient's history has been reviewed, patient examined, no change in status, stable for surgery.  I have reviewed the patient's chart and labs.  Questions were answered to the patient's satisfaction.     Annamarie Major

## 2016-10-10 NOTE — H&P (View-Only) (Signed)
 [facility]  Patient name: Christine Reed MRN: [id number] DOB: 05/03/1985 Sex: female   REQUESTING PROVIDER:    Dr. Coladonato   REASON FOR CONSULT:    HD access  HISTORY OF PRESENT ILLNESS:   Christine Reed is a 31 y.o. female, who is Referred today for evaluation of dialysis access.  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  She is right-handed.  PAST MEDICAL HISTORY    Past Medical History:  Diagnosis Date  . Anemia   . Chronic kidney disease   . Diabetes mellitus    diagnosed at age 13  . Diabetic retinopathy (HCC)   . Hypertension      FAMILY HISTORY   Family History  Problem Relation Age of Onset  . Cancer Maternal Grandmother        colon and breast  . Diabetes Maternal Grandfather   . Diabetes Mother   . Diabetes Father   . Diabetes Paternal Grandmother   . Diabetes Paternal Grandfather     SOCIAL HISTORY:   Social History   Social History  . Marital status: Single    Spouse name: N/A  . Number of children: N/A  . Years of education: N/A   Occupational History  . Not on file.   Social History Main Topics  . Smoking status: Never Smoker  . Smokeless tobacco: Never Used  . Alcohol use No  . Drug use: No  . Sexual activity: Not Currently   Other Topics Concern  . Not on file   Social History Narrative  . No narrative on file    ALLERGIES:    Allergies  Allergen Reactions  . Iodine Rash  . Nickel Rash    CURRENT MEDICATIONS:    Current Outpatient Prescriptions  Medication Sig Dispense Refill  . acetaminophen (TYLENOL) 500 MG tablet Take 500 mg by mouth every 6 (six) hours as needed for mild pain or headache.    . amLODipine (NORVASC) 10 MG tablet TAKE ONE TABLET BY MOUTH ONCE DAILY 30 tablet 0  . Blood Glucose Monitoring Suppl (ACCU-CHEK AVIVA PLUS) w/Device KIT Use to check blood sugars 5 times daily 1 kit 1  .  diphenhydrAMINE (BENADRYL) 25 MG tablet Take 25 mg by mouth every 6 (six) hours as needed for itching or allergies.     . furosemide (LASIX) 40 MG tablet Take 40 mg by mouth.    . glucose blood (ACCU-CHEK AVIVA PLUS) test strip Use to check blood sugars 5 times daily. 150 each 3  . insulin aspart (NOVOLOG) 100 UNIT/ML injection Inject 4-12 Units into the skin 3 (three) times daily before meals.    . Insulin Detemir (LEVEMIR) 100 UNIT/ML Pen Inject 4 units twice daily. (Patient taking differently: Inject 10 Units into the skin See admin instructions. Inject 10 units subcutaneously ever morning and 10 units at bedtime) 15 mL 4   No current facility-administered medications for this visit.     REVIEW OF SYSTEMS:   [X] denotes positive finding, [ ] denotes negative finding Cardiac  Comments:  Chest pain or chest pressure:    Shortness of breath upon exertion:    Short of breath when lying flat:    Irregular heart rhythm:        Vascular    Pain in calf, thigh, or hip brought on by ambulation:    Pain in feet at night that wakes you up from your sleep:       Blood clot in your veins:    Leg swelling:         Pulmonary    Oxygen at home:    Productive cough:     Wheezing:         Neurologic    Sudden weakness in arms or legs:     Sudden numbness in arms or legs:     Sudden onset of difficulty speaking or slurred speech:    Temporary loss of vision in one eye:     Problems with dizziness:         Gastrointestinal    Blood in stool:      Vomited blood:         Genitourinary    Burning when urinating:     Blood in urine:        Psychiatric    Major depression:         Hematologic    Bleeding problems:    Problems with blood clotting too easily:        Skin    Rashes or ulcers:        Constitutional    Fever or chills:     PHYSICAL EXAM:   Vitals:   09/30/16 1036  BP: (!) 155/101  Pulse: 93  SpO2: 100%  Weight: 153 lb 6.4 oz (69.6 kg)  Height: 5' 5" (1.651 m)     GENERAL: The patient is a well-nourished female, in no acute distress. The vital signs are documented above. CARDIAC: There is a regular rate and rhythm.  VASCULAR: Palpable right radial and brachial pulse PULMONARY: Nonlabored respirations MUSCULOSKELETAL: There are no major deformities or cyanosis. NEUROLOGIC: No focal weakness or paresthesias are detected. SKIN: There are no ulcers or rashes noted. PSYCHIATRIC: The patient has a normal affect.  STUDIES:   I have reviewed her vascular lab studies.  She has a high takeoff of the right radial artery in the axilla. Her best vein is the right cephalic vein with diameter measurements from 0.31-0.24  ASSESSMENT and PLAN   Chronic renal insufficiency: We discussed proceeded with a right brachiocephalic fistula.  Risks and benefits of procedure discussed with the patient clinically the risk of non-maturity, the risk of steal, at the need for future interventions.  Her procedure is been scheduled for Thursday, August 30   Wells Admire Bunnell, MD [facility] [place] Tel [phone] Pager [phone] 

## 2016-10-10 NOTE — Transfer of Care (Signed)
Immediate Anesthesia Transfer of Care Note  Patient: Christine Reed  Procedure(s) Performed: Procedure(s): CREATION OF RIGHT ARM BRACHIOCEPHALIC ARTERIOVENOUS (AV) FISTULA (Right)  Patient Location: PACU  Anesthesia Type:MAC  Level of Consciousness: awake, patient cooperative and responds to stimulation  Airway & Oxygen Therapy: Patient Spontanous Breathing and Patient connected to nasal cannula oxygen  Post-op Assessment: Report given to RN and Post -op Vital signs reviewed and stable  Post vital signs: Reviewed and stable  Last Vitals:  Vitals:   10/10/16 0618 10/10/16 0619  BP:  136/85  Pulse: 89   Resp: 18   Temp: 36.8 C   SpO2: 100%     Last Pain:  Vitals:   10/10/16 0618  TempSrc: Oral      Patients Stated Pain Goal: 3 (08/65/78 4696)  Complications: No apparent anesthesia complications

## 2016-10-10 NOTE — Anesthesia Preprocedure Evaluation (Signed)
Anesthesia Evaluation  Patient identified by MRN, date of birth, ID band Patient awake    Reviewed: Allergy & Precautions, H&P , NPO status , Patient's Chart, lab work & pertinent test results  Airway Mallampati: II   Neck ROM: full    Dental   Pulmonary neg pulmonary ROS,    breath sounds clear to auscultation       Cardiovascular hypertension,  Rhythm:regular Rate:Normal     Neuro/Psych    GI/Hepatic GERD  ,  Endo/Other  diabetes, Type 1, Insulin Dependent  Renal/GU ESRFRenal disease     Musculoskeletal   Abdominal   Peds  Hematology  (+) anemia ,   Anesthesia Other Findings   Reproductive/Obstetrics                             Anesthesia Physical Anesthesia Plan  ASA: II  Anesthesia Plan: MAC   Post-op Pain Management:    Induction: Intravenous  PONV Risk Score and Plan: 2 and Ondansetron, Dexamethasone, Propofol infusion and Midazolam  Airway Management Planned: Simple Face Mask  Additional Equipment:   Intra-op Plan:   Post-operative Plan:   Informed Consent: I have reviewed the patients History and Physical, chart, labs and discussed the procedure including the risks, benefits and alternatives for the proposed anesthesia with the patient or authorized representative who has indicated his/her understanding and acceptance.     Plan Discussed with: CRNA, Anesthesiologist and Surgeon  Anesthesia Plan Comments:         Anesthesia Quick Evaluation

## 2016-10-10 NOTE — Discharge Instructions (Addendum)
° °  [facility]  Discharge Instructions  AV Fistula or Graft Surgery for Dialysis Access  Please refer to the following instructions for your post-procedure care. Your surgeon or physician assistant will discuss any changes with you.  Activity  You may drive the day following your surgery, if you are comfortable and no longer taking prescription pain medication. Resume full activity as the soreness in your incision resolves.  Bathing/Showering  You may shower after you go home. Keep your incision dry for 48 hours. Do not soak in a bathtub, hot tub, or swim until the incision heals completely. You may not shower if you have a hemodialysis catheter.  Incision Care  Clean your incision with mild soap and water after 48 hours. Pat the area dry with a clean towel. You do not need a bandage unless otherwise instructed. Do not apply any ointments or creams to your incision. You may have skin glue on your incision. Do not peel it off. It will come off on its own in about one week. Your arm may swell a bit after surgery. To reduce swelling use pillows to elevate your arm so it is above your heart. Your doctor will tell you if you need to lightly wrap your arm with an ACE bandage.  Diet  Resume your normal diet. There are not special food restrictions following this procedure. In order to heal from your surgery, it is CRITICAL to get adequate nutrition. Your body requires vitamins, minerals, and protein. Vegetables are the best source of vitamins and minerals. Vegetables also provide the perfect balance of protein. Processed food has little nutritional value, so try to avoid this.  Medications  Resume taking all of your medications. If your incision is causing pain, you may take over-the counter pain relievers such as acetaminophen (Tylenol). If you were prescribed a stronger pain medication, please be aware these medications can cause nausea and constipation. Prevent  nausea by taking the medication with a snack or meal. Avoid constipation by drinking plenty of fluids and eating foods with high amount of fiber, such as fruits, vegetables, and grains. Do not take Tylenol if you are taking prescription pain medications.  Follow up Your surgeon may want to see you in the office following your access surgery. If so, this will be arranged at the time of your surgery.  Please call us immediately for any of the following conditions:  Increased pain, redness, drainage (pus) from your incision site Fever of 101 degrees or higher Severe or worsening pain at your incision site Hand pain or numbness.  Reduce your risk of vascular disease:  Stop smoking. If you would like help, call [facility] at 1-800-QUIT-NOW 660-692-9756) or [place] [facility] [place] your cholesterol Maintain a desired weight Control your diabetes Keep your blood pressure down  Dialysis  It will take several weeks to several months for your new dialysis access to be ready for use. Your surgeon will determine when it is OK to use it. Your nephrologist will continue to direct your dialysis. You can continue to use your Permcath until your new access is ready for use.   10/10/2016 Christine Reed [id number] [birth year]  Surgeon(s): Serafina Mitchell, MD  Procedure(s): CREATION OF RIGHT UPPER ARM RADIAL CEPHALIC ARTERIOVENOUS (AV) FISTULA  x Do not stick fistula for 12 weeks    If you have any questions, please call the office at [phone].

## 2016-10-11 ENCOUNTER — Telehealth: Payer: Self-pay | Admitting: Surgery

## 2016-10-11 ENCOUNTER — Encounter (HOSPITAL_COMMUNITY): Payer: Self-pay | Admitting: Surgery

## 2016-10-11 NOTE — Telephone Encounter (Signed)
-----   Message from Mena Goes, RN sent at 10/10/2016 11:23 AM EDT ----- Regarding: 4-6 weeks w/ duplex   ----- Message ----- From: Serafina Mitchell, MD Sent: 10/10/2016   9:01 AM To: Vvs Charge Pool  10-10-2016:  Surgeon:  Annamarie Major Assistants:  S. Rhyne Procedure:   Right upper arm radiocephalic fistula   F/u 4-6 weeks with duplex

## 2016-10-11 NOTE — Telephone Encounter (Signed)
Spoke to pt for appt 10/15 [place] & OV, mailed letter

## 2016-10-15 ENCOUNTER — Inpatient Hospital Stay (HOSPITAL_COMMUNITY): Admission: RE | Admit: 2016-10-15 | Payer: Self-pay | Source: Ambulatory Visit

## 2016-10-22 ENCOUNTER — Ambulatory Visit: Payer: Self-pay | Admitting: Endocrinology

## 2016-10-23 ENCOUNTER — Ambulatory Visit
Admission: RE | Admit: 2016-10-23 | Discharge: 2016-10-23 | Disposition: A | Discharge status: Home / Self Care | Payer: MEDICAID

## 2016-10-23 ENCOUNTER — Ambulatory Visit: Admission: RE | Admit: 2016-10-23 | Discharge: 2016-10-23 | Payer: MEDICAID

## 2016-10-23 ENCOUNTER — Ambulatory Visit
Admission: RE | Admit: 2016-10-23 | Discharge: 2016-10-23 | Disposition: A | Discharge status: Home / Self Care | Payer: MEDICAID | Attending: Nephrology | Admitting: Nephrology

## 2016-10-23 DIAGNOSIS — N186 End stage renal disease: Secondary | ICD-10-CM

## 2016-10-23 DIAGNOSIS — E1022 Type 1 diabetes mellitus with diabetic chronic kidney disease: Secondary | ICD-10-CM

## 2016-10-23 DIAGNOSIS — Z01818 Encounter for other preprocedural examination: Secondary | ICD-10-CM

## 2016-10-23 DIAGNOSIS — Z9289 Personal history of other medical treatment: Secondary | ICD-10-CM

## 2016-10-23 DIAGNOSIS — Z7289 Other problems related to lifestyle: Secondary | ICD-10-CM

## 2016-10-23 DIAGNOSIS — I10 Essential (primary) hypertension: Secondary | ICD-10-CM

## 2016-10-23 DIAGNOSIS — E139 Other specified diabetes mellitus without complications: Principal | ICD-10-CM

## 2016-10-23 DIAGNOSIS — I158 Other secondary hypertension: Secondary | ICD-10-CM

## 2016-10-23 DIAGNOSIS — D573 Sickle-cell trait: Secondary | ICD-10-CM

## 2016-10-23 DIAGNOSIS — E108 Type 1 diabetes mellitus with unspecified complications: Principal | ICD-10-CM

## 2016-10-23 DIAGNOSIS — Z114 Encounter for screening for human immunodeficiency virus [HIV]: Secondary | ICD-10-CM

## 2016-10-23 DIAGNOSIS — Z6825 Body mass index (BMI) 25.0-25.9, adult: Secondary | ICD-10-CM

## 2016-10-23 DIAGNOSIS — N185 Chronic kidney disease, stage 5: Secondary | ICD-10-CM

## 2016-10-23 DIAGNOSIS — Z8759 Personal history of other complications of pregnancy, childbirth and the puerperium: Secondary | ICD-10-CM

## 2016-10-24 ENCOUNTER — Ambulatory Visit: Payer: Self-pay | Admitting: Endocrinology

## 2016-11-04 ENCOUNTER — Encounter (HOSPITAL_COMMUNITY)
Admission: RE | Admit: 2016-11-04 | Discharge: 2016-11-04 | Disposition: A | Discharge status: Home / Self Care | Payer: Self-pay | Source: Ambulatory Visit | Attending: Nephrology | Admitting: Nephrology

## 2016-11-04 DIAGNOSIS — D631 Anemia in chronic kidney disease: Secondary | ICD-10-CM | POA: Insufficient documentation

## 2016-11-04 DIAGNOSIS — N183 Chronic kidney disease, stage 3 (moderate): Secondary | ICD-10-CM | POA: Insufficient documentation

## 2016-11-13 ENCOUNTER — Other Ambulatory Visit: Payer: Self-pay

## 2016-11-13 DIAGNOSIS — Z48812 Encounter for surgical aftercare following surgery on the circulatory system: Secondary | ICD-10-CM

## 2016-11-13 DIAGNOSIS — N183 Chronic kidney disease, stage 3 unspecified: Secondary | ICD-10-CM

## 2016-11-25 ENCOUNTER — Encounter (HOSPITAL_COMMUNITY): Payer: Self-pay

## 2016-11-25 ENCOUNTER — Encounter: Payer: Self-pay | Admitting: Surgery

## 2016-11-29 ENCOUNTER — Encounter: Payer: Self-pay | Admitting: Endocrinology

## 2016-11-29 ENCOUNTER — Telehealth: Payer: Self-pay

## 2016-11-29 ENCOUNTER — Other Ambulatory Visit: Payer: Self-pay

## 2016-11-29 ENCOUNTER — Ambulatory Visit (INDEPENDENT_AMBULATORY_CARE_PROVIDER_SITE_OTHER): Payer: Self-pay | Admitting: Endocrinology

## 2016-11-29 VITALS — BP 138/82 | HR 101 | Ht 65.0 in | Wt 155.0 lb

## 2016-11-29 DIAGNOSIS — E1065 Type 1 diabetes mellitus with hyperglycemia: Secondary | ICD-10-CM

## 2016-11-29 LAB — POCT GLYCOSYLATED HEMOGLOBIN (HGB A1C): Hemoglobin A1C: 8.7

## 2016-11-29 MED ORDER — INSULIN GLARGINE 300 UNIT/ML ~~LOC~~ SOPN: 26.0000 [IU] | PEN_INJECTOR | Freq: Every day | SUBCUTANEOUS | 1 refills | Status: DC

## 2016-11-29 MED ORDER — ACCU-CHEK FASTCLIX LANCETS MISC: 0 refills | Status: DC

## 2016-11-29 MED ORDER — FREESTYLE LANCETS MISC: 12 refills | Status: DC

## 2016-11-29 MED ORDER — GLUCOSE BLOOD VI STRP: ORAL_STRIP | 3 refills | Status: DC

## 2016-11-29 NOTE — Progress Notes (Signed)
Patient ID: Christine Reed, female   DOB: [birth year], 31 y.o.   MRN: [id number]           Reason for Appointment : Follow-up for Type 1 Diabetes  History of Present Illness          Diagnosis: Type 1 diabetes mellitus, date of diagnosis: 2000         Previous history:   She was initially started on NPH and Regular Insulin and subsequently was on Lantus and Humalog Usually has had poor control but records are not available for the last few years from her previous endocrinologist In April her Lantus was changed Novolin N presumably because of tendency to low sugars during the night  Recent history:   INSULIN regimen is: Novolog at meals carbohydrate coverage 1:10, correction factor 1 units per 50 mg over 150 Levemir 10-morning and 11 units in evening at bedtime  She has not been seen in follow-up for the last 8 months  A1c is 7.8, similar to her previous level  Current management, blood sugar patterns and problems identified:    She has checked her blood sugars only in the last 10 days or so with the [employer] monitor  She was given a prescription for the Accu-Chek meter which he claims that [employer] did not give to her  Also she says that she is feeling very tired and she does not take her blood sugars much because of this  She said that after dialysis she gets very hungry and then she will eat frequently without taking insulin  Otherwise she may not have much appetite and may not eat consistently, does have one low normal blood sugar at lunchtime  She is checking her blood sugars mostly FASTING and these are mostly high but has 3 readings below 140  She has only sporadic blood sugar test late at night and these are markedly increased  Again she does not give a good history about whether she is taking both her Levemir and NovoLog consistently when she needs to  She completely refuses the idea of her freestyle libre sensor or any kind of pump and she does not want any object on  her body Also she did not increase her basal insulin as directed on her last visit in the morning, not clear if her Levemir is lasting all day    Glucose monitoring:  is being done 3-5 times a day         Glucometer:  Contour Blood Glucose readings from   Mean values apply above for all meters except median for One Touch  PRE-MEAL Fasting Lunch Dinner Bedtime Overall  Glucose range:  93-3 31  64-297   3 80-600    Average     267     Minimal hypoglycemia  Factors causing hypoglycemia: Excessive insulin, sometimes increased activity Symptoms of hypoglycemia:Weakness Treatment of hypoglycemia: As above          Self-care: The diet that the patient has been following is: Carbohydrate counting Usually eating cream of wheat at breakfast, mostly salad at lunch and dinner is usually at 8-10 PM  Snacks usually are apple or crackers          Exercise: none recently          Dietician consultation: Most recent: 5/17 in the hospital .         CDE consultation:?  Wt Readings from Last 3 Encounters:  11/29/16 155 lb (70.3 kg)  10/10/16 162 lb (73.5  kg)  09/30/16 153 lb 6.4 oz (69.6 kg)    Diabetes labs:  Lab Results  Component Value Date   HGBA1C 7.8 (H) 09/04/2016   HGBA1C 7.9 (H) 01/17/2016   HGBA1C 6.2 09/29/2015   Lab Results  Component Value Date   MICROALBUR 93.9 (H) 09/04/2016   LDLCALC 103 (H) 09/04/2016   CREATININE 5.44 (H) 10/01/2016   Microalbumin ratio 5011 done in 2/17  Lab Results  Component Value Date   MICRALBCREAT 90.6 (H) 09/04/2016     Allergies as of 11/29/2016      Reactions   Iodine Rash   Nickel Rash      Medication List       Accurate as of 11/29/16 10:43 AM. Always use your most recent med list.          ACCU-CHEK AVIVA PLUS w/Device Kit Use to check blood sugars 5 times daily   acetaminophen 500 MG tablet Commonly known as:  TYLENOL Take 1,000 mg by mouth every 6 (six) hours as needed for mild pain or headache.   amLODipine 10  MG tablet Commonly known as:  NORVASC TAKE ONE TABLET BY MOUTH ONCE DAILY   diphenhydrAMINE 25 MG tablet Commonly known as:  BENADRYL Take 25 mg by mouth every 6 (six) hours as needed for itching or allergies.   glucose blood test strip Commonly known as:  ACCU-CHEK AVIVA PLUS Use to check blood sugars 5 times daily.   Insulin Detemir 100 UNIT/ML Pen Commonly known as:  LEVEMIR Inject 4 units twice daily.   NOVOLOG 100 UNIT/ML injection Generic drug:  insulin aspart Inject 0-20 Units into the skin 3 (three) times daily before meals. Sliding scale       Allergies:  Allergies  Allergen Reactions  . Iodine Rash  . Nickel Rash    Past Medical History:  Diagnosis Date  . Anemia   . Chronic kidney disease   . Diabetes mellitus    diagnosed at age 14; Type 1  . Diabetic retinopathy ([place])   . GERD (gastroesophageal reflux disease)   . Hypertension     Past Surgical History:  Procedure Laterality Date  . AV FISTULA PLACEMENT Right 10/10/2016   Procedure: CREATION OF RIGHT ARM RADIOCEPHALIC ARTERIOVENOUS (AV) FISTULA;  Surgeon: Serafina Mitchell, MD;  Location: [facility];  Service: Vascular;  Laterality: Right;  . CESAREAN SECTION N/A 10/19/2015   Procedure: CESAREAN SECTION;  Surgeon: Chancy Milroy, MD;  Location: [facility];  Service: Obstetrics;  Laterality: N/A;  . lipo suction  2015  . TONSILLECTOMY    . TONSILLECTOMY AND ADENOIDECTOMY      Family History  Problem Relation Age of Onset  . Cancer Maternal Grandmother        colon and breast  . Diabetes Maternal Grandfather   . Diabetes Mother   . Diabetes Father   . Diabetes Paternal Grandmother   . Diabetes Paternal Grandfather     Social History:  reports that she has never smoked. She has never used smokeless tobacco. She reports that she does not drink alcohol or use drugs.      Review of Systems   She has renal insufficiency and nephrotic syndrome followed by nephrologist She has been told that  she will need dialysis now  LABS:  No visits with results within 1 Week(s) from this visit.  Latest known visit with results is:  Admission on 10/10/2016, Discharged on 10/10/2016  Component Date Value Ref Range Status  . Preg,  Serum 10/10/2016 NEGATIVE  NEGATIVE Final   Comment:        THE SENSITIVITY OF THIS METHODOLOGY IS >10 mIU/mL.   Marland Kitchen Sodium 10/10/2016 139  135 - 145 mmol/L Final  . Potassium 10/10/2016 3.9  3.5 - 5.1 mmol/L Final  . Glucose, Bld 10/10/2016 164* 65 - 99 mg/dL Final  . HCT 10/10/2016 27.0* 36.0 - 46.0 % Final  . Hemoglobin 10/10/2016 9.2* 12.0 - 15.0 g/dL Final  . Glucose-Capillary 10/10/2016 184* 65 - 99 mg/dL Final  . Comment 1 10/10/2016 Notify RN   Final  . Comment 2 10/10/2016 Document in Chart   Final    Physical Examination:  BP 138/82 (BP Location: Left Arm, Patient Position: Sitting, Cuff Size: Normal)   Pulse (!) 101   Ht '5\' 5"'  (1.651 m)   Wt 155 lb (70.3 kg)   SpO2 97%   BMI 25.79 kg/m    ASSESSMENT:  Diabetes type 1 With labile blood sugars See history of present illness for detailed discussion of current diabetes management, blood sugar patterns and problems identified  Her A1c is Now over 8%  She has not  been motivated to do much for her diabetes care Also she claimed that because of her fatigue and variable appetite she is not able to take insulin and check her sugars as directed Only recently has started using the [employer] meter difficult to identify any consistent pattern She has much higher readings at night but has only 3 readings for the last month Also she did not increase her basal insulin as directed on her last visit in the morning   influenza vaccination was refused by patient today  PLAN:    Start checking blood sugars 4 times a day, will start with a Accu-Chek meter that was given today  She will increase her carbohydrate coverage to the 1:8 ratio instead of 1:10 and this was emphasized  She does need to take as  many injections as needed for her NovoLog to cover all her meals and snacks especially when she is overeating in the afternoon after diagnosis  She will increase her Levemir to 12 units and been finished she will replace this with Toujeo  Discussed that this will hopefully give her better 24 control more effectively  The dose will be adjusted based on her fasting blood sugar patterns, to start with 24 units  She will see the diabetes educator for further evaluation and help with day-to-day management including diet  To be sure and take insulin for meals before eating   There are no Patient Instructions on file for this visit.  Counseling time on subjects discussed above is over 50% of today's 25 minute visit   Darean Rote 11/29/2016, 10:43 AM   Note: This note was prepared with Estate agent. Any transcriptional errors that result from this process are unintentional.

## 2016-11-29 NOTE — Patient Instructions (Addendum)
Take 1 unit per 8g carbs Novolog for all carbs u eat  Levemir in am will be 12 units   Toujeo 24 units to start when Levemir started and go up to 26 if am sugar >150

## 2016-12-06 NOTE — Telephone Encounter (Signed)
Error

## 2016-12-09 ENCOUNTER — Other Ambulatory Visit: Payer: Self-pay

## 2016-12-09 MED ORDER — ACCU-CHEK FASTCLIX LANCETS MISC: 0 refills | Status: DC

## 2016-12-16 ENCOUNTER — Encounter: Payer: Self-pay | Admitting: Surgery

## 2016-12-16 ENCOUNTER — Ambulatory Visit (HOSPITAL_COMMUNITY)
Admission: RE | Admit: 2016-12-16 | Discharge: 2016-12-16 | Disposition: A | Discharge status: Home / Self Care | Payer: Medicaid Other | Source: Ambulatory Visit | Attending: Surgery | Admitting: Surgery

## 2016-12-16 ENCOUNTER — Ambulatory Visit (INDEPENDENT_AMBULATORY_CARE_PROVIDER_SITE_OTHER): Payer: Self-pay | Admitting: Surgery

## 2016-12-16 VITALS — BP 151/99 | HR 85 | Temp 97.4°F | Resp 16 | Ht 65.0 in | Wt 149.0 lb

## 2016-12-16 DIAGNOSIS — N186 End stage renal disease: Secondary | ICD-10-CM

## 2016-12-16 DIAGNOSIS — Z992 Dependence on renal dialysis: Secondary | ICD-10-CM

## 2016-12-16 DIAGNOSIS — I82611 Acute embolism and thrombosis of superficial veins of right upper extremity: Secondary | ICD-10-CM | POA: Diagnosis not present

## 2016-12-16 DIAGNOSIS — Z48812 Encounter for surgical aftercare following surgery on the circulatory system: Secondary | ICD-10-CM | POA: Insufficient documentation

## 2016-12-16 DIAGNOSIS — N183 Chronic kidney disease, stage 3 unspecified: Secondary | ICD-10-CM

## 2016-12-16 NOTE — Progress Notes (Signed)
   Patient name: Christine Reed MRN: [id number] DOB: 02-20-1985 Sex: female  REASON FOR VISIT:     post op  HISTORY OF PRESENT ILLNESS:   Christine Reed is a 31 y.o. female returns today for her first postoperative visit.  She is status post right upper arm radiocephalic fistula (high takeoff of the right radial artery).  She has started dialysis through a catheter.  She has no complaints of steal  CURRENT MEDICATIONS:    Current Outpatient Medications  Medication Sig Dispense Refill  . ACCU-CHEK FASTCLIX LANCETS MISC As directed 100 each 0  . acetaminophen (TYLENOL) 500 MG tablet Take 1,000 mg by mouth every 6 (six) hours as needed for mild pain or headache.     Marland Kitchen amLODipine (NORVASC) 10 MG tablet TAKE ONE TABLET BY MOUTH ONCE DAILY 30 tablet 0  . Blood Glucose Monitoring Suppl (ACCU-CHEK AVIVA PLUS) w/Device KIT Use to check blood sugars 5 times daily (Patient taking differently: 1 each by Other route 4 (four) times daily. Use to check blood sugars 3-4 times daily) 1 kit 1  . diphenhydrAMINE (BENADRYL) 25 MG tablet Take 25 mg by mouth every 6 (six) hours as needed for itching or allergies.     Marland Kitchen glucose blood (ACCU-CHEK AVIVA PLUS) test strip Use to check blood sugars four times daily 150 each 3  . insulin aspart (NOVOLOG) 100 UNIT/ML injection Inject 0-20 Units into the skin 3 (three) times daily before meals. Sliding scale    . Insulin Detemir (LEVEMIR) 100 UNIT/ML Pen Inject 4 units twice daily. (Patient taking differently: Inject 10 Units into the skin 2 (two) times daily. 10 units in the morning & 10 units in the evening.) 15 mL 4  . Insulin Glargine (TOUJEO SOLOSTAR) 300 UNIT/ML SOPN Inject 26 Units into the skin daily. 3 mL 1   No current facility-administered medications for this visit.     REVIEW OF SYSTEMS:   _0  denotes positive finding, _1  denotes negative finding Cardiac  Comments:  Chest pain or chest pressure:    Shortness of breath  upon exertion:    Short of breath when lying flat:    Irregular heart rhythm:    Constitutional    Fever or chills:      PHYSICAL EXAM:   Vitals:   12/16/16 0924  BP: (!) 151/99  Pulse: 85  Resp: 16  Temp: (!) 97.4 F (36.3 C)  SpO2: 95%  Weight: 149 lb (67.6 kg)  Height: _2  (1.651 m)    GENERAL: The patient is a well-nourished female, in no acute distress. The vital signs are documented above. CARDIOVASCULAR: There is a regular rate and rhythm. PULMONARY: Non-labored respirations Palpable thrill within the fistula  STUDIES:   Duplex reveals that the vein has developed nicely.  Most diameter measurements are 0.9 cm.  There is soft and minimally echogenic thrombus about 3 cm in length up near the shoulder that does not cause any hemodynamic significance   MEDICAL ISSUES:   The fistula will be ready for use on 01/03/2017  Annamarie Major, MD [facility] [phone] Pager [phone]

## 2016-12-27 ENCOUNTER — Emergency Department (HOSPITAL_COMMUNITY)
Admission: EM | Admit: 2016-12-27 | Discharge: 2016-12-27 | Disposition: A | Discharge status: Home / Self Care | Payer: Medicaid Other | Attending: Emergency Medicine | Admitting: Emergency Medicine

## 2016-12-27 ENCOUNTER — Other Ambulatory Visit: Payer: Self-pay

## 2016-12-27 ENCOUNTER — Emergency Department (HOSPITAL_COMMUNITY): Payer: Medicaid Other

## 2016-12-27 ENCOUNTER — Encounter (HOSPITAL_COMMUNITY): Payer: Self-pay | Admitting: *Deleted

## 2016-12-27 DIAGNOSIS — E1022 Type 1 diabetes mellitus with diabetic chronic kidney disease: Secondary | ICD-10-CM | POA: Insufficient documentation

## 2016-12-27 DIAGNOSIS — I120 Hypertensive chronic kidney disease with stage 5 chronic kidney disease or end stage renal disease: Secondary | ICD-10-CM | POA: Diagnosis present

## 2016-12-27 DIAGNOSIS — Z992 Dependence on renal dialysis: Secondary | ICD-10-CM | POA: Diagnosis not present

## 2016-12-27 DIAGNOSIS — N186 End stage renal disease: Secondary | ICD-10-CM | POA: Insufficient documentation

## 2016-12-27 DIAGNOSIS — R5381 Other malaise: Secondary | ICD-10-CM | POA: Diagnosis not present

## 2016-12-27 DIAGNOSIS — I10 Essential (primary) hypertension: Secondary | ICD-10-CM

## 2016-12-27 DIAGNOSIS — R7309 Other abnormal glucose: Secondary | ICD-10-CM

## 2016-12-27 DIAGNOSIS — Z79899 Other long term (current) drug therapy: Secondary | ICD-10-CM | POA: Diagnosis not present

## 2016-12-27 LAB — CBC
HEMATOCRIT: 29.7 % — AB (ref 36.0–46.0)
Hemoglobin: 9.8 g/dL — ABNORMAL LOW (ref 12.0–15.0)
MCH: 29.5 pg (ref 26.0–34.0)
MCHC: 33 g/dL (ref 30.0–36.0)
MCV: 89.5 fL (ref 78.0–100.0)
PLATELETS: 316 10*3/uL (ref 150–400)
RBC: 3.32 MIL/uL — AB (ref 3.87–5.11)
RDW: 15.5 % (ref 11.5–15.5)
WBC: 9.4 10*3/uL (ref 4.0–10.5)

## 2016-12-27 LAB — I-STAT BETA HCG BLOOD, ED (MC, WL, AP ONLY): I-stat hCG, quantitative: <5 m[IU]/mL (ref <5)

## 2016-12-27 LAB — URINALYSIS, ROUTINE W REFLEX MICROSCOPIC
BILIRUBIN URINE: NEGATIVE
Bacteria, UA: NONE SEEN
Glucose, UA: >=500 mg/dL — AB
HGB URINE DIPSTICK: NEGATIVE
KETONES UR: NEGATIVE mg/dL
LEUKOCYTES UA: NEGATIVE
NITRITE: NEGATIVE
PH: 9 — AB (ref 5.0–8.0)
Protein, ur: >=300 mg/dL — AB
Specific Gravity, Urine: 1.013 (ref 1.005–1.030)

## 2016-12-27 LAB — CBG MONITORING, ED
Glucose-Capillary: 152 mg/dL — ABNORMAL HIGH (ref 65–99)
Glucose-Capillary: 222 mg/dL — ABNORMAL HIGH (ref 65–99)
Glucose-Capillary: 312 mg/dL — ABNORMAL HIGH (ref 65–99)

## 2016-12-27 LAB — BASIC METABOLIC PANEL
Anion gap: 8 (ref 5–15)
BUN: 30 mg/dL — ABNORMAL HIGH (ref 6–20)
CHLORIDE: 107 mmol/L (ref 101–111)
CO2: 25 mmol/L (ref 22–32)
CREATININE: 6.1 mg/dL — AB (ref 0.44–1.00)
Calcium: 9 mg/dL (ref 8.9–10.3)
GFR calc non Af Amer: 8 mL/min — ABNORMAL LOW (ref >60)
GFR, EST AFRICAN AMERICAN: 10 mL/min — AB (ref >60)
Glucose, Bld: 124 mg/dL — ABNORMAL HIGH (ref 65–99)
POTASSIUM: 4 mmol/L (ref 3.5–5.1)
SODIUM: 140 mmol/L (ref 135–145)

## 2016-12-27 LAB — PREGNANCY, URINE: PREG TEST UR: NEGATIVE

## 2016-12-27 MED ORDER — LABETALOL HCL 5 MG/ML IV SOLN
10.0000 mg | Freq: Once | INTRAVENOUS | Status: DC
  Filled 2016-12-27: qty 4

## 2016-12-27 MED ORDER — CLONIDINE HCL 0.1 MG PO TABS
0.1000 mg | ORAL_TABLET | Freq: Once | ORAL | Status: AC
  Administered 2016-12-27: 0.1 mg via ORAL
  Filled 2016-12-27: qty 1

## 2016-12-27 MED ORDER — INSULIN ASPART 100 UNIT/ML ~~LOC~~ SOLN: 0.0000 [IU] | Freq: Three times a day (TID) | SUBCUTANEOUS | 0 refills | Status: DC

## 2016-12-27 MED ORDER — AMLODIPINE BESYLATE 5 MG PO TABS
10.0000 mg | ORAL_TABLET | Freq: Once | ORAL | Status: AC
  Administered 2016-12-27: 10 mg via ORAL
  Filled 2016-12-27: qty 2

## 2016-12-27 NOTE — ED Notes (Signed)
The pt does not want anyhting else done  She just wants to go home.  Talking with dr Ellender Hose

## 2016-12-27 NOTE — ED Provider Notes (Signed)
[facility] EMERGENCY DEPARTMENT Provider Note   CSN: [id number] Arrival date & time: 12/27/16  3536     History   Chief Complaint Chief Complaint  Patient presents with  . Hypertension    HPI Christine Reed is a 31 y.o. female.  HPI   Christine Reed is a 31 y.o. female, with a history of type I DM, HTN, anemia, and GERD, and end-stage renal disease on dialysis, presenting to the ED with generalized malaise beginning this morning. States, "I just don't feel right."  Took her levemir and sliding scale Novolog around 8pm last night after eating dinner, as she normally would. Went to bed around 11PM.  States she woke up in the middle the night around 3 AM, "did not feel right," her blood sugar read "high" on her home meter, and patient took 10 units of her NovoLog.   Woke up again around 530 to 6 AM and noted that her blood sugar was 21.  States she felt "off."  Went to eat some yogurt and then from there her memory is hazy until the paramedics arrived.  BP 240/120 with EMS. Mother notes that the patient at one point was on the sofa and on 2 occasions "started shaking" for a few seconds and then was groggy afterward, however, patient states she remembers shaking and thus does not think she had seizure. Had chocolate milk, juice, cream of wheat, and oral glucose while ems at house between 8-9am.  Took 8 units novolog at 12:30pm due to BG over 400. Usually runs in low 200s in afternoon.   Was scheduled for dialysis today at 1320, but did not go because she was here in the ED.   Has had intermittent generalized headache today, no headache currently.   Denies recent illness, fever/chills, N/V/D, SOB, CP, abdominal pain, neuro deficits, dizziness, or any other complaints.    Goes to [employer] [facility] [employer] on [address]. On a Mon, Wed, Fri schedule. Went to dialysis as she normally would on Wednesday, November 14. States that because of the upcoming Thanksgiving holiday, she  will be on a temporary Sunday, Tuesday, Friday schedule and thus her next scheduled dialysis is November 18.  She has an appointment with her endocrinologist, Dr. Dwyane Dee, on Monday, November 19.    Past Medical History:  Diagnosis Date  . Anemia   . Chronic kidney disease   . Diabetes mellitus    diagnosed at age 29; Type 1  . Diabetic retinopathy ([place])   . GERD (gastroesophageal reflux disease)   . Hypertension     Patient Active Problem List   Diagnosis Date Noted  . DKA, type 1 ([place]) 03/01/2016  . Nausea & vomiting 03/01/2016  . Influenza A 03/01/2016  . Nephrotic syndrome 10/17/2015  . Ascites 10/17/2015  . Symptomatic anemia 09/11/2015  . Anemia affecting pregnancy in second trimester, antepartum 08/12/2015  . Diabetic nephropathy ([place]) 08/07/2015  . CKD (chronic kidney disease) stage 3, GFR 30-59 ml/min ([facility]) 08/07/2015  . Diabetic retinopathy ([place]) 07/24/2015  . Chronic hypertension during pregnancy, antepartum 07/22/2015  . Anemia, chronic disease 07/17/2015  . T1DM - Type F 06/16/2015  . Supervision of high risk pregnancy, antepartum 06/16/2015  . Type 1 diabetes mellitus with ketoacidosis without coma ([place]) 02/19/2012    Past Surgical History:  Procedure Laterality Date  . CESAREAN SECTION N/A 10/19/2015   Performed by Chancy Milroy, MD at [place]  . CREATION OF RIGHT ARM RADIOCEPHALIC ARTERIOVENOUS (AV) FISTULA  Right 10/10/2016   Performed by Serafina Mitchell, MD at [facility]  2015  . TONSILLECTOMY    . TONSILLECTOMY AND ADENOIDECTOMY      OB History    Gravida Para Term Preterm AB Living   _0 SAB TAB Ectopic Multiple Live Births         0         Home Medications    Prior to Admission medications   Medication Sig Start Date End Date Taking? Authorizing Provider  acetaminophen (TYLENOL) 500 MG tablet Take 1,000 mg by mouth every 6 (six) hours as needed for mild pain or headache.    Yes [provider]    amLODipine (NORVASC) 10 MG tablet TAKE ONE TABLET BY MOUTH ONCE DAILY 04/16/16  Yes Kathrine Haddock N, CNM  buPROPion (WELLBUTRIN XL) 150 MG 24 hr tablet Take 150 mg daily by mouth.   Yes [provider]  diphenhydrAMINE (BENADRYL) 25 MG tablet Take 25 mg by mouth every 6 (six) hours as needed for itching or allergies.    Yes [provider]  hydrOXYzine (VISTARIL) 25 MG capsule Take 25-50 mg at bedtime as needed by mouth (FOR SLEEP).   Yes [provider]  insulin aspart (NOVOLOG) 100 UNIT/ML injection Inject 0-20 Units into the skin 3 (three) times daily before meals. Sliding scale   Yes [provider]  Insulin Detemir (LEVEMIR) 100 UNIT/ML Pen Inject 4 units twice daily. Patient taking differently: Inject 10 Units into the skin 2 (two) times daily. 10 units in the morning & 10 units in the evening. 10/22/15  Yes Aletha Halim, MD  ACCU-CHEK FASTCLIX LANCETS MISC As directed 12/09/16   Elayne Snare, MD  Blood Glucose Monitoring Suppl (ACCU-CHEK AVIVA PLUS) w/Device KIT Use to check blood sugars 5 times daily Patient taking differently: 1 each by Other route 4 (four) times daily. Use to check blood sugars 3-4 times daily 09/09/16   Elayne Snare, MD  glucose blood (ACCU-CHEK AVIVA PLUS) test strip Use to check blood sugars four times daily 11/29/16   Elayne Snare, MD  Insulin Glargine (TOUJEO SOLOSTAR) 300 UNIT/ML SOPN Inject 26 Units into the skin daily. Patient not taking: Reported on 12/27/2016 11/29/16   Elayne Snare, MD    Family History Family History  Problem Relation Age of Onset  . Cancer Maternal Grandmother        colon and breast  . Diabetes Maternal Grandfather   . Diabetes Mother   . Diabetes Father   . Diabetes Paternal Grandmother   . Diabetes Paternal Grandfather     Social History Social History   Tobacco Use  . Smoking status: Never Smoker  . Smokeless tobacco: Never Used  Substance Use Topics  . Alcohol use: No  . Drug use: No      Allergies   Iodine and Nickel   Review of Systems Review of Systems  Constitutional: Negative for chills, diaphoresis and fever.       "I don't feel right."  Respiratory: Negative for shortness of breath.   Cardiovascular: Negative for chest pain.  Gastrointestinal: Negative for abdominal pain, diarrhea, nausea and vomiting.  Neurological: Negative for weakness and numbness.  All other systems reviewed and are negative.    Physical Exam Updated Vital Signs BP (!) 181/99 (BP Location: Left Arm)   Pulse 91   Temp 97.6 F (36.4 C) (Oral)   Resp 16  SpO2 100%   Physical Exam  Constitutional: She is oriented to person, place, and time. She appears well-developed and well-nourished. No distress.  HENT:  Head: Normocephalic and atraumatic.  Eyes: Conjunctivae and EOM are normal. Pupils are equal, round, and reactive to light.  Neck: Neck supple.  Cardiovascular: Normal rate, regular rhythm, normal heart sounds and intact distal pulses.  Pulmonary/Chest: Effort normal and breath sounds normal. No respiratory distress.  No increased work of breathing noted.  Patient speaks in full sentences without difficulty. Ambulated without dizziness, shortness of breath, chest pain, or other complaint.  No noted increased work of breathing or distress during ambulation.  Abdominal: Soft. There is no tenderness. There is no guarding.  Musculoskeletal: She exhibits no edema.  Lymphadenopathy:    She has no cervical adenopathy.  Neurological: She is alert and oriented to person, place, and time.  No sensory deficits.  No noted speech deficits. No aphasia. Patient handles oral secretions without difficulty. No noted swallowing defects.  Equal grip strength bilaterally. Strength 5/5 in the upper extremities. Strength 5/5 with flexion and extension of the hips, knees, and ankles bilaterally.  Patellar DTRs 2+ bilaterally. Negative Romberg. No gait disturbance.  Coordination intact  including heel to shin and finger to nose.  Cranial nerves III-XII grossly intact.  No facial droop.   Skin: Skin is warm and dry. Capillary refill takes less than 2 seconds. She is not diaphoretic.  Psychiatric: She has a normal mood and affect. Her behavior is normal.  Nursing note and vitals reviewed.    ED Treatments / Results  Labs (all labs ordered are listed, but only abnormal results are displayed) Labs Reviewed  BASIC METABOLIC PANEL - Abnormal; Notable for the following components:      Result Value   Glucose, Bld 124 (*)    BUN 30 (*)    Creatinine, Ser 6.10 (*)    GFR calc non Af Amer 8 (*)    GFR calc Af Amer 10 (*)    All other components within normal limits  CBC - Abnormal; Notable for the following components:   RBC 3.32 (*)    Hemoglobin 9.8 (*)    HCT 29.7 (*)    All other components within normal limits  URINALYSIS, ROUTINE W REFLEX MICROSCOPIC - Abnormal; Notable for the following components:   pH 9.0 (*)    Glucose, UA >=500 (*)    Protein, ur >=300 (*)    Squamous Epithelial / LPF 0-5 (*)    All other components within normal limits  CBG MONITORING, ED - Abnormal; Notable for the following components:   Glucose-Capillary 152 (*)    All other components within normal limits  CBG MONITORING, ED - Abnormal; Notable for the following components:   Glucose-Capillary 222 (*)    All other components within normal limits  I-STAT BETA HCG BLOOD, ED ([facility], [facility], [facility] ONLY)  POC URINE PREG, ED    EKG  EKG Interpretation None       Radiology No results found.  Procedures Procedures (including critical care time)  Medications Ordered in ED Medications  amLODipine (NORVASC) tablet 10 mg (10 mg Oral Given 12/27/16 1613)     Initial Impression / Assessment and Plan / ED Course  I have reviewed the triage vital signs and the nursing notes.  Pertinent labs & imaging results that were available during my care of the patient were reviewed by me and  considered in my medical decision making (see chart for details).  Clinical Course as of Dec 27 1701  Fri Dec 27, 2016  1615 Spoke with the patient about the proposed plan. Patient does not want an IV.  Will consult nephrology for proposed plan of adding additional PO BP medication.   [SJ]  [id number] Spoke with Dr. Joelyn Oms, nephrologist.  Recommends staying with her current regimen of home medications. Can have a dose of clonidine here.  States patient should call the dialysis center to try to get in for tomorrow.   [SJ]  [id number] Spoke with patient about the revised plan. Patient agrees to the plan and states she is ok with the plan for head CT.   [SJ]    Clinical Course User Index [SJ] Dorrance Sellick C, PA-C    Patient presents with symptomatic hypertension.  Low suspicion for hypertensive emergency.  No neurologic abnormalities noted.  Patient does not have noted indications for emergent dialysis. Although patient states she remembers the shaking event, her family is suspicious patient had a seizure and does not truly remember the event.  Suspect patient's hypoglycemic episode was due to the extra dose of insulin she took early this morning. Will have patient decrease her sliding scale regimen by one unit. Patient states she is out of her Novolog and requests a refiill, so we will write for this.    End of shift patient care handoff report given to Fenton Foy, EM resident.  Plan: Review response to clonidine. Review head CT. Discharge patient.   Findings and plan of care discussed with Duffy Bruce, MD.   Vitals:   12/27/16 1530 12/27/16 1600 12/27/16 1613 12/27/16 1615  BP: (!) 160/107 (!) 168/106 (!) 168/106 (!) 173/99  Pulse:  94    Resp:    17  Temp:      TempSrc:      SpO2:  96%       Final Clinical Impressions(s) / ED Diagnoses   Final diagnoses:  None    ED Discharge Orders    None        Lorayne Bender, PA-C 12/27/16 1704    Duffy Bruce, MD 12/28/16 2102

## 2016-12-27 NOTE — ED Notes (Signed)
The pt has agreed to wait  For the c-t results   But she is ready to go

## 2016-12-27 NOTE — Discharge Instructions (Addendum)
Please decrease your sliding scale insulin by ONE UNIT for each blood sugar range, starting today.  Please call your dialysis center to get in for a session tomorrow, November 17th.

## 2016-12-27 NOTE — ED Notes (Signed)
CBG : 312

## 2016-12-27 NOTE — ED Notes (Signed)
Offered food pt not interested

## 2016-12-27 NOTE — ED Notes (Signed)
Pt has not had her c-t yet  She does not want to wait any longer  She is hungry  I offered her a sandwich but she refused  She just wants to go home.

## 2016-12-27 NOTE — ED Triage Notes (Addendum)
Pt in via [facility], per report pt CBG read high this am & took insulin, pt went back to bed & found by her mother to have CBG 21 & pt given food and CBG 84 pta, pt A&O x4, pt c/o high blood pressure, denies n/v/d, pt gets HD and scheduled today

## 2016-12-27 NOTE — ED Notes (Signed)
Pt CBG was 152, notified Wendy(RN)

## 2016-12-27 NOTE — ED Notes (Signed)
The pt refuses the iv and iv med and she does not want anymore needle sticks

## 2016-12-30 ENCOUNTER — Ambulatory Visit: Payer: Self-pay | Admitting: Endocrinology

## 2017-01-01 ENCOUNTER — Encounter: Payer: Self-pay | Admitting: Nephrology

## 2017-01-09 ENCOUNTER — Ambulatory Visit
Admission: RE | Admit: 2017-01-09 | Discharge: 2017-01-22 | Disposition: A | Discharge status: Home / Self Care | Payer: MEDICAID

## 2017-01-09 ENCOUNTER — Ambulatory Visit
Admission: RE | Admit: 2017-01-09 | Discharge: 2017-01-09 | Disposition: A | Discharge status: Home / Self Care | Payer: MEDICAID

## 2017-01-09 DIAGNOSIS — N186 End stage renal disease: Secondary | ICD-10-CM

## 2017-01-09 DIAGNOSIS — R93429 Abnormal radiologic findings on diagnostic imaging of unspecified kidney: Principal | ICD-10-CM

## 2017-01-09 DIAGNOSIS — Z01818 Encounter for other preprocedural examination: Secondary | ICD-10-CM

## 2017-01-09 DIAGNOSIS — Z01810 Encounter for preprocedural cardiovascular examination: Secondary | ICD-10-CM

## 2017-01-13 ENCOUNTER — Other Ambulatory Visit: Payer: Self-pay

## 2017-01-13 ENCOUNTER — Telehealth: Payer: Self-pay | Admitting: Endocrinology

## 2017-01-13 MED ORDER — INSULIN DETEMIR 100 UNIT/ML FLEXPEN: PEN_INJECTOR | SUBCUTANEOUS | 0 refills | Status: DC

## 2017-01-13 NOTE — Telephone Encounter (Signed)
We can only give her 1 box of Levemir and she needs to make appointment for follow-up.  Let her know that if she does not come regularly she will be dismissed

## 2017-01-13 NOTE — Telephone Encounter (Signed)
Called patient and she stated that she never went on the [facility] and stated that the Toujeo is not covered by her insurance. She stated that she has been taking the Levemir and it is covered by her insurance and she is currently taking 10 units in the AM and 11 units in the PM.   Please advise if okay to fill the Levemir since she is out and if the current dose is okay.

## 2017-01-13 NOTE — Telephone Encounter (Signed)
I called the patient and let her know that I have sent over one box of the Levemir. I also made her an appointment to come in tomorrow at 4:15pm.

## 2017-01-13 NOTE — Telephone Encounter (Signed)
Pt called and insurance does not cover insulin. Needs to speak with someone to see if there is an a different insulin they will cover. Please advise

## 2017-01-14 ENCOUNTER — Ambulatory Visit: Payer: Self-pay | Admitting: Endocrinology

## 2017-02-06 ENCOUNTER — Ambulatory Visit
Admission: RE | Admit: 2017-02-06 | Discharge: 2017-02-06 | Disposition: A | Discharge status: Home / Self Care | Payer: MEDICAID

## 2017-02-06 ENCOUNTER — Ambulatory Visit
Admission: RE | Admit: 2017-02-06 | Discharge: 2017-02-06 | Disposition: A | Discharge status: Home / Self Care | Payer: MEDICAID | Attending: Registered" | Admitting: Registered"

## 2017-02-06 ENCOUNTER — Ambulatory Visit
Admission: RE | Admit: 2017-02-06 | Discharge: 2017-02-06 | Disposition: A | Discharge status: Home / Self Care | Payer: MEDICAID | Attending: Transplant Surgery | Admitting: Transplant Surgery

## 2017-02-06 ENCOUNTER — Ambulatory Visit
Admission: RE | Admit: 2017-02-06 | Discharge: 2017-02-06 | Disposition: A | Discharge status: Home / Self Care | Payer: MEDICAID | Attending: Pain Medicine | Admitting: Pain Medicine

## 2017-02-06 DIAGNOSIS — N186 End stage renal disease: Secondary | ICD-10-CM

## 2017-02-06 DIAGNOSIS — Z01810 Encounter for preprocedural cardiovascular examination: Secondary | ICD-10-CM

## 2017-02-06 DIAGNOSIS — Z01818 Encounter for other preprocedural examination: Secondary | ICD-10-CM

## 2017-02-06 DIAGNOSIS — R93429 Abnormal radiologic findings on diagnostic imaging of unspecified kidney: Principal | ICD-10-CM

## 2017-02-06 DIAGNOSIS — N185 Chronic kidney disease, stage 5: Principal | ICD-10-CM

## 2017-02-06 DIAGNOSIS — Z7682 Awaiting organ transplant status: Secondary | ICD-10-CM

## 2017-02-21 ENCOUNTER — Emergency Department (HOSPITAL_COMMUNITY): Payer: Medicare Other

## 2017-02-21 ENCOUNTER — Inpatient Hospital Stay (HOSPITAL_COMMUNITY)
Admission: EM | Admit: 2017-02-21 | Discharge: 2017-02-24 | DRG: 871 | Disposition: A | Discharge status: Home / Self Care | Payer: Medicare Other | Attending: Family Medicine | Admitting: Family Medicine

## 2017-02-21 ENCOUNTER — Other Ambulatory Visit: Payer: Self-pay

## 2017-02-21 ENCOUNTER — Encounter (HOSPITAL_COMMUNITY): Payer: Self-pay | Admitting: Emergency Medicine

## 2017-02-21 DIAGNOSIS — N19 Unspecified kidney failure: Secondary | ICD-10-CM | POA: Diagnosis present

## 2017-02-21 DIAGNOSIS — E860 Dehydration: Secondary | ICD-10-CM | POA: Diagnosis present

## 2017-02-21 DIAGNOSIS — E1110 Type 2 diabetes mellitus with ketoacidosis without coma: Secondary | ICD-10-CM | POA: Diagnosis present

## 2017-02-21 DIAGNOSIS — E1010 Type 1 diabetes mellitus with ketoacidosis without coma: Secondary | ICD-10-CM | POA: Diagnosis present

## 2017-02-21 DIAGNOSIS — K219 Gastro-esophageal reflux disease without esophagitis: Secondary | ICD-10-CM | POA: Diagnosis present

## 2017-02-21 DIAGNOSIS — D573 Sickle-cell trait: Secondary | ICD-10-CM | POA: Diagnosis present

## 2017-02-21 DIAGNOSIS — A4189 Other specified sepsis: Principal | ICD-10-CM | POA: Diagnosis present

## 2017-02-21 DIAGNOSIS — N2581 Secondary hyperparathyroidism of renal origin: Secondary | ICD-10-CM | POA: Diagnosis present

## 2017-02-21 DIAGNOSIS — N186 End stage renal disease: Secondary | ICD-10-CM | POA: Diagnosis present

## 2017-02-21 DIAGNOSIS — E875 Hyperkalemia: Secondary | ICD-10-CM

## 2017-02-21 DIAGNOSIS — Z992 Dependence on renal dialysis: Secondary | ICD-10-CM

## 2017-02-21 DIAGNOSIS — F329 Major depressive disorder, single episode, unspecified: Secondary | ICD-10-CM | POA: Diagnosis present

## 2017-02-21 DIAGNOSIS — Z79899 Other long term (current) drug therapy: Secondary | ICD-10-CM

## 2017-02-21 DIAGNOSIS — E10319 Type 1 diabetes mellitus with unspecified diabetic retinopathy without macular edema: Secondary | ICD-10-CM | POA: Diagnosis present

## 2017-02-21 DIAGNOSIS — E1022 Type 1 diabetes mellitus with diabetic chronic kidney disease: Secondary | ICD-10-CM | POA: Diagnosis present

## 2017-02-21 DIAGNOSIS — D638 Anemia in other chronic diseases classified elsewhere: Secondary | ICD-10-CM | POA: Diagnosis present

## 2017-02-21 DIAGNOSIS — R6889 Other general symptoms and signs: Secondary | ICD-10-CM | POA: Diagnosis present

## 2017-02-21 DIAGNOSIS — A419 Sepsis, unspecified organism: Secondary | ICD-10-CM | POA: Diagnosis present

## 2017-02-21 DIAGNOSIS — D631 Anemia in chronic kidney disease: Secondary | ICD-10-CM | POA: Diagnosis present

## 2017-02-21 DIAGNOSIS — R112 Nausea with vomiting, unspecified: Secondary | ICD-10-CM | POA: Diagnosis present

## 2017-02-21 DIAGNOSIS — B349 Viral infection, unspecified: Secondary | ICD-10-CM

## 2017-02-21 DIAGNOSIS — I120 Hypertensive chronic kidney disease with stage 5 chronic kidney disease or end stage renal disease: Secondary | ICD-10-CM | POA: Diagnosis present

## 2017-02-21 LAB — CBC WITH DIFFERENTIAL/PLATELET
BASOS PCT: 0 %
Basophils Absolute: 0 10*3/uL (ref 0.0–0.1)
EOS ABS: 0 10*3/uL (ref 0.0–0.7)
EOS PCT: 0 %
HCT: 35.3 % — ABNORMAL LOW (ref 36.0–46.0)
Hemoglobin: 12.3 g/dL (ref 12.0–15.0)
Lymphocytes Relative: 8 %
Lymphs Abs: 1.3 10*3/uL (ref 0.7–4.0)
MCH: 30.8 pg (ref 26.0–34.0)
MCHC: 34.8 g/dL (ref 30.0–36.0)
MCV: 88.5 fL (ref 78.0–100.0)
MONO ABS: 1.6 10*3/uL — AB (ref 0.1–1.0)
MONOS PCT: 9 %
Neutro Abs: 13.6 10*3/uL — ABNORMAL HIGH (ref 1.7–7.7)
Neutrophils Relative %: 83 %
PLATELETS: 269 10*3/uL (ref 150–400)
RBC: 3.99 MIL/uL (ref 3.87–5.11)
RDW: 15.8 % — AB (ref 11.5–15.5)
WBC: 16.5 10*3/uL — ABNORMAL HIGH (ref 4.0–10.5)

## 2017-02-21 LAB — COMPREHENSIVE METABOLIC PANEL
ALBUMIN: 3.4 g/dL — AB (ref 3.5–5.0)
ALK PHOS: 99 U/L (ref 38–126)
ALT: 29 U/L (ref 14–54)
AST: 30 U/L (ref 15–41)
Anion gap: 17 — ABNORMAL HIGH (ref 5–15)
BILIRUBIN TOTAL: 1 mg/dL (ref 0.3–1.2)
BUN: 53 mg/dL — ABNORMAL HIGH (ref 6–20)
CALCIUM: 8.4 mg/dL — AB (ref 8.9–10.3)
CO2: 21 mmol/L — AB (ref 22–32)
CREATININE: 8.96 mg/dL — AB (ref 0.44–1.00)
Chloride: 95 mmol/L — ABNORMAL LOW (ref 101–111)
GFR calc Af Amer: 6 mL/min — ABNORMAL LOW (ref >60)
GFR calc non Af Amer: 5 mL/min — ABNORMAL LOW (ref >60)
GLUCOSE: 366 mg/dL — AB (ref 65–99)
Potassium: 4.9 mmol/L (ref 3.5–5.1)
SODIUM: 133 mmol/L — AB (ref 135–145)
Total Protein: 7.1 g/dL (ref 6.5–8.1)

## 2017-02-21 LAB — CBG MONITORING, ED: Glucose-Capillary: 421 mg/dL — ABNORMAL HIGH (ref 65–99)

## 2017-02-21 LAB — I-STAT BETA HCG BLOOD, ED (MC, WL, AP ONLY)

## 2017-02-21 LAB — I-STAT CG4 LACTIC ACID, ED: Lactic Acid, Venous: 1.71 mmol/L (ref 0.5–1.9)

## 2017-02-21 MED ORDER — ONDANSETRON HCL 4 MG/2ML IJ SOLN
4.0000 mg | Freq: Once | INTRAMUSCULAR | Status: AC
  Administered 2017-02-21: 4 mg via INTRAVENOUS
  Filled 2017-02-21: qty 2

## 2017-02-21 MED ORDER — SODIUM CHLORIDE 0.9 % IV BOLUS (SEPSIS)
250.0000 mL | Freq: Once | INTRAVENOUS | Status: AC
  Administered 2017-02-21: 250 mL via INTRAVENOUS

## 2017-02-21 MED ORDER — ACETAMINOPHEN 325 MG PO TABS
650.0000 mg | ORAL_TABLET | Freq: Once | ORAL | Status: AC
  Administered 2017-02-21: 650 mg via ORAL
  Filled 2017-02-21: qty 2

## 2017-02-21 NOTE — ED Provider Notes (Signed)
[facility] EMERGENCY DEPARTMENT Provider Note   CSN: [id number] Arrival date & time: 02/21/17  1308     History   Chief Complaint Chief Complaint  Patient presents with  . Influenza    HPI Christine Reed is a 32 y.o. female.  The history is provided by the patient and medical records.  Influenza  Presenting symptoms: cough, diarrhea, nausea, sore throat and vomiting   Associated symptoms: nasal congestion      [name] with hx of anemia, CKD, DM, GERD, HTN, presenting to the ED for flu like symptoms.  States these were of sudden onset yesterday afternoon.  Specifically she has had cough, nasal congestion, sore throat, nausea, vomiting, and diarrhea.  States she has had very little oral intake.  Mother reports she is very fatigued, was up all night last night vomiting.  Her son has also been sick with similar symptoms.  She reports feeling warm but unsure of fever.  No abdominal pain, chest pain, or SOB.  Patient is on HD, usually MWF but did not go today because she was not feeling well.  She did get full dialysis Wednesday.  She idd not get a flu shot this past season--- states she got one the year before but still ended up with the flu so opted out.  Past Medical History:  Diagnosis Date  . Anemia   . Chronic kidney disease   . Diabetes mellitus    diagnosed at age 70; Type 1  . Diabetic retinopathy ([place])   . GERD (gastroesophageal reflux disease)   . Hypertension     Patient Active Problem List   Diagnosis Date Noted  . DKA, type 1 ([place]) 03/01/2016  . Nausea & vomiting 03/01/2016  . Influenza A 03/01/2016  . Nephrotic syndrome 10/17/2015  . Ascites 10/17/2015  . Symptomatic anemia 09/11/2015  . Anemia affecting pregnancy in second trimester, antepartum 08/12/2015  . Diabetic nephropathy ([place]) 08/07/2015  . CKD (chronic kidney disease) stage 3, GFR 30-59 ml/min (HCC) 08/07/2015  . Diabetic retinopathy ([place]) 07/24/2015  . Chronic hypertension  during pregnancy, antepartum 07/22/2015  . Anemia, chronic disease 07/17/2015  . T1DM - Type F 06/16/2015  . Supervision of high risk pregnancy, antepartum 06/16/2015  . Type 1 diabetes mellitus with ketoacidosis without coma ([place]) 02/19/2012    Past Surgical History:  Procedure Laterality Date  . AV FISTULA PLACEMENT Right 10/10/2016   Procedure: CREATION OF RIGHT ARM RADIOCEPHALIC ARTERIOVENOUS (AV) FISTULA;  Surgeon: Serafina Mitchell, MD;  Location: [facility];  Service: Vascular;  Laterality: Right;  . CESAREAN SECTION N/A 10/19/2015   Procedure: CESAREAN SECTION;  Surgeon: Chancy Milroy, MD;  Location: [facility];  Service: Obstetrics;  Laterality: N/A;  . lipo suction  2015  . TONSILLECTOMY    . TONSILLECTOMY AND ADENOIDECTOMY      OB History    Gravida Para Term Preterm AB Living   '1 1   1       ' SAB TAB Ectopic Multiple Live Births         0         Home Medications    Prior to Admission medications   Medication Sig Start Date End Date Taking? Authorizing Provider  amLODipine (NORVASC) 10 MG tablet TAKE ONE TABLET BY MOUTH ONCE DAILY 04/16/16  Yes Kathrine Haddock N, CNM  buPROPion (WELLBUTRIN XL) 150 MG 24 hr tablet Take 150 mg daily by mouth.   Yes [provider]  insulin  aspart (NOVOLOG) 100 UNIT/ML injection Inject 0-20 Units 3 (three) times daily before meals into the skin. Sliding scale 12/27/16  Yes Joy, Shawn C, PA-C  Insulin Detemir (LEVEMIR) 100 UNIT/ML Pen Inject 10 units in the morning and inject 11 units in the evening. Patient taking differently: Inject 10-11 Units into the skin See admin instructions. Inject 10 units in the morning and inject 11 units in the evening. 01/13/17  Yes Elayne Snare, MD  LOSARTAN POTASSIUM PO Take 1 tablet by mouth daily.   Yes [provider]  ACCU-CHEK FASTCLIX LANCETS MISC As directed 12/09/16   Elayne Snare, MD  Blood Glucose Monitoring Suppl (ACCU-CHEK AVIVA PLUS) w/Device KIT Use to check blood sugars 5 times  daily Patient taking differently: 1 each by Other route 4 (four) times daily. Use to check blood sugars 3-4 times daily 09/09/16   Elayne Snare, MD  glucose blood (ACCU-CHEK AVIVA PLUS) test strip Use to check blood sugars four times daily 11/29/16   Elayne Snare, MD  Insulin Glargine (TOUJEO SOLOSTAR) 300 UNIT/ML SOPN Inject 26 Units into the skin daily. Patient not taking: Reported on 12/27/2016 11/29/16   Elayne Snare, MD    Family History Family History  Problem Relation Age of Onset  . Cancer Maternal Grandmother        colon and breast  . Diabetes Maternal Grandfather   . Diabetes Mother   . Diabetes Father   . Diabetes Paternal Grandmother   . Diabetes Paternal Grandfather     Social History Social History   Tobacco Use  . Smoking status: Never Smoker  . Smokeless tobacco: Never Used  Substance Use Topics  . Alcohol use: No  . Drug use: No     Allergies   Ivp dye [iodinated diagnostic agents]; Iodine; and Nickel   Review of Systems Review of Systems  HENT: Positive for congestion and sore throat.   Respiratory: Positive for cough.   Gastrointestinal: Positive for diarrhea, nausea and vomiting.  All other systems reviewed and are negative.    Physical Exam Updated Vital Signs BP 131/80 (BP Location: Left Arm)   Pulse (!) 101   Temp 100.1 F (37.8 C) (Oral)   Resp 18   SpO2 97%   Physical Exam  Constitutional: She is oriented to person, place, and time. She appears well-developed and well-nourished.  Appears fatigued  HENT:  Head: Normocephalic and atraumatic.  Mouth/Throat: Oropharynx is clear and moist.  Eyes: Conjunctivae and EOM are normal. Pupils are equal, round, and reactive to light.  Neck: Normal range of motion.  Cardiovascular: Regular rhythm and normal heart sounds. Tachycardia present.  Pulmonary/Chest: Effort normal and breath sounds normal. No stridor. No respiratory distress.  Dialysis catheter right chest wall, site appears clean Dry  cough on exam Lungs clear, no distress  Abdominal: Soft. Bowel sounds are normal. There is no tenderness. There is no rebound.  Musculoskeletal: Normal range of motion.  Neurological: She is alert and oriented to person, place, and time.  Skin: Skin is warm and dry.  Psychiatric: She has a normal mood and affect.  Nursing note and vitals reviewed.    ED Treatments / Results  Labs (all labs ordered are listed, but only abnormal results are displayed) Labs Reviewed  COMPREHENSIVE METABOLIC PANEL - Abnormal; Notable for the following components:      Result Value   Sodium 133 (*)    Chloride 95 (*)    CO2 21 (*)    Glucose, Bld 366 (*)  BUN 53 (*)    Creatinine, Ser 8.96 (*)    Calcium 8.4 (*)    Albumin 3.4 (*)    GFR calc non Af Amer 5 (*)    GFR calc Af Amer 6 (*)    Anion gap 17 (*)    All other components within normal limits  CBC WITH DIFFERENTIAL/PLATELET - Abnormal; Notable for the following components:   WBC 16.5 (*)    HCT 35.3 (*)    RDW 15.8 (*)    Neutro Abs 13.6 (*)    Monocytes Absolute 1.6 (*)    All other components within normal limits  LACTIC ACID, PLASMA - Abnormal; Notable for the following components:   Lactic Acid, Venous 3.7 (*)    All other components within normal limits  BASIC METABOLIC PANEL - Abnormal; Notable for the following components:   Sodium 129 (*)    Chloride 97 (*)    CO2 16 (*)    Glucose, Bld 394 (*)    BUN 67 (*)    Creatinine, Ser 10.27 (*)    Calcium 7.9 (*)    GFR calc non Af Amer 5 (*)    GFR calc Af Amer 5 (*)    Anion gap 16 (*)    All other components within normal limits  CBG MONITORING, ED - Abnormal; Notable for the following components:   Glucose-Capillary 421 (*)    All other components within normal limits  I-STAT CHEM 8, ED - Abnormal; Notable for the following components:   Sodium 130 (*)    Potassium 6.1 (*)    Chloride 97 (*)    BUN 55 (*)    Creatinine, Ser 10.70 (*)    Glucose, Bld 515 (*)     Calcium, Ion 0.96 (*)    TCO2 21 (*)    Hemoglobin 11.2 (*)    HCT 33.0 (*)    All other components within normal limits  CBG MONITORING, ED - Abnormal; Notable for the following components:   Glucose-Capillary 473 (*)    All other components within normal limits  CULTURE, BLOOD (ROUTINE X 2)  CULTURE, BLOOD (ROUTINE X 2)  URINE CULTURE  RESPIRATORY PANEL BY PCR  RAPID STREP SCREEN (NOT AT [facility])  INFLUENZA PANEL BY PCR (TYPE A & B)  LACTIC ACID, PLASMA  PROCALCITONIN  URINALYSIS, ROUTINE W REFLEX MICROSCOPIC  HIV ANTIBODY (ROUTINE TESTING)  BASIC METABOLIC PANEL  BASIC METABOLIC PANEL  BASIC METABOLIC PANEL  I-STAT CG4 LACTIC ACID, ED  I-STAT BETA HCG BLOOD, ED ([facility], [facility], [facility] ONLY)    EKG  EKG Interpretation None       Radiology Dg Chest 2 View  Result Date: 02/21/2017 CLINICAL DATA:  Cough and fever.  Renal failure. EXAM: CHEST  2 VIEW COMPARISON:  March 01, 2016 FINDINGS: Central catheter tip is at the cavoatrial junction. No pneumothorax. Lungs are clear. Heart size and pulmonary vascularity are normal. No adenopathy. No bone lesions. IMPRESSION: Central catheter tip at cavoatrial junction. No pneumothorax. Lungs clear. Electronically Signed   By: Lowella Grip III M.D.   On: 02/21/2017 14:46    Procedures Procedures (including critical care time)  Medications Ordered in ED Medications  acetaminophen (TYLENOL) tablet 650 mg (not administered)  ondansetron (ZOFRAN) injection 4 mg (not administered)  amLODipine (NORVASC) tablet 10 mg (not administered)  buPROPion (WELLBUTRIN XL) 24 hr tablet 150 mg (not administered)  losartan (COZAAR) tablet 25 mg (not administered)  dextromethorphan-guaiFENesin (MUCINEX DM) 30-600 MG per 12 hr  tablet 1 tablet (not administered)  albuterol (PROVENTIL) (2.5 MG/3ML) 0.083% nebulizer solution 2.5 mg (not administered)  0.9 %  sodium chloride infusion ( Intravenous New Bag/Given 02/22/17 0351)  dextrose 5 %-0.45 % sodium chloride  infusion (not administered)  insulin regular (NOVOLIN R,HUMULIN R) 100 Units in sodium chloride 0.9 % 100 mL (1 Units/mL) infusion (4.1 Units/hr Intravenous New Bag/Given 02/22/17 0430)  heparin injection 5,000 Units (not administered)  hydrALAZINE (APRESOLINE) injection 5 mg (not administered)  zolpidem (AMBIEN) tablet 5 mg (not administered)  sodium chloride 0.9 % bolus 500 mL (not administered)  sodium chloride 0.9 % bolus 250 mL (0 mLs Intravenous Stopped 02/22/17 0052)  ondansetron (ZOFRAN) injection 4 mg (4 mg Intravenous Given 02/21/17 2331)  acetaminophen (TYLENOL) tablet 650 mg (650 mg Oral Given 02/21/17 2331)  insulin aspart (novoLOG) injection 10 Units (10 Units Intravenous Given 02/22/17 0253)  calcium gluconate 2 g in sodium chloride 0.9 % 100 mL IVPB (0 g Intravenous Stopped 02/22/17 0451)     Initial Impression / Assessment and Plan / ED Course  I have reviewed the triage vital signs and the nursing notes.  Pertinent labs & imaging results that were available during my care of the patient were reviewed by me and considered in my medical decision making (see chart for details).  32 year old female here with flulike symptoms for the past 24 hours.  Specifically she has had nasal congestion, sore throat, cough, nausea, vomiting, and diarrhea.  Mother reports she has had very little sleep as she was up vomiting.  She did miss dialysis today as she was not feeling well.  Patient does appear very tired on exam but is not lethargic.  She does have a low-grade fever and is tachycardic.  She denies any chest pain or shortness of breath presently.  Labs with hyperglycemia and elevated anion gap of 17.  Bicarb is mildly low at 21.  Although patient missed dialysis today, she does appear somewhat clinically dry, which I suspect is from her vomiting.  Her chest x-ray is clear and she does not have any signs of pulmonary edema.  We will plan for flu swab, 250cc bolus, zofran.  Will reassess.  Repeat  chemistry panel here with improved anion gap, however now K+ is 6.1.  EKG with peaked t-waves.  Patient did miss dialysis yesterday and will not get dialyzed again until Monday (2 days from now).  Will give dose of insulin here to try to help lower K+, but she will require admission.  Discussed with Dr. Blaine Hamper-- he will admit for ongoing care.  Have also spoken with Dr. Justin Mend (nephrology), he will help arrange dialysis for today.  Final Clinical Impressions(s) / ED Diagnoses   Final diagnoses:  Viral syndrome  Hyperkalemia    ED Discharge Orders    None       Larene Pickett, PA-C 02/22/17 0086    Veryl Speak, MD 02/22/17 [phone]

## 2017-02-21 NOTE — ED Triage Notes (Signed)
Pt to ER for evaluation of flu like symptoms onset yesterday - cough, sore throat, fever, nausea, and vomiting. Pt is on dialysis and is diabetic. Reports body aches. VSS

## 2017-02-22 ENCOUNTER — Other Ambulatory Visit: Payer: Self-pay

## 2017-02-22 DIAGNOSIS — F329 Major depressive disorder, single episode, unspecified: Secondary | ICD-10-CM | POA: Diagnosis present

## 2017-02-22 DIAGNOSIS — K219 Gastro-esophageal reflux disease without esophagitis: Secondary | ICD-10-CM | POA: Diagnosis present

## 2017-02-22 DIAGNOSIS — N19 Unspecified kidney failure: Secondary | ICD-10-CM | POA: Diagnosis not present

## 2017-02-22 DIAGNOSIS — N186 End stage renal disease: Secondary | ICD-10-CM

## 2017-02-22 DIAGNOSIS — D638 Anemia in other chronic diseases classified elsewhere: Secondary | ICD-10-CM | POA: Diagnosis not present

## 2017-02-22 DIAGNOSIS — E875 Hyperkalemia: Secondary | ICD-10-CM | POA: Insufficient documentation

## 2017-02-22 DIAGNOSIS — R6889 Other general symptoms and signs: Secondary | ICD-10-CM | POA: Diagnosis not present

## 2017-02-22 DIAGNOSIS — E10319 Type 1 diabetes mellitus with unspecified diabetic retinopathy without macular edema: Secondary | ICD-10-CM | POA: Diagnosis present

## 2017-02-22 DIAGNOSIS — B349 Viral infection, unspecified: Secondary | ICD-10-CM | POA: Diagnosis present

## 2017-02-22 DIAGNOSIS — E860 Dehydration: Secondary | ICD-10-CM | POA: Diagnosis present

## 2017-02-22 DIAGNOSIS — N2581 Secondary hyperparathyroidism of renal origin: Secondary | ICD-10-CM | POA: Diagnosis present

## 2017-02-22 DIAGNOSIS — I120 Hypertensive chronic kidney disease with stage 5 chronic kidney disease or end stage renal disease: Secondary | ICD-10-CM | POA: Diagnosis present

## 2017-02-22 DIAGNOSIS — E1110 Type 2 diabetes mellitus with ketoacidosis without coma: Secondary | ICD-10-CM | POA: Diagnosis present

## 2017-02-22 DIAGNOSIS — A419 Sepsis, unspecified organism: Secondary | ICD-10-CM | POA: Diagnosis not present

## 2017-02-22 DIAGNOSIS — E1022 Type 1 diabetes mellitus with diabetic chronic kidney disease: Secondary | ICD-10-CM | POA: Diagnosis present

## 2017-02-22 DIAGNOSIS — D573 Sickle-cell trait: Secondary | ICD-10-CM | POA: Diagnosis present

## 2017-02-22 DIAGNOSIS — Z992 Dependence on renal dialysis: Secondary | ICD-10-CM | POA: Diagnosis not present

## 2017-02-22 DIAGNOSIS — D631 Anemia in chronic kidney disease: Secondary | ICD-10-CM | POA: Diagnosis present

## 2017-02-22 DIAGNOSIS — R112 Nausea with vomiting, unspecified: Secondary | ICD-10-CM

## 2017-02-22 DIAGNOSIS — Z79899 Other long term (current) drug therapy: Secondary | ICD-10-CM | POA: Diagnosis not present

## 2017-02-22 DIAGNOSIS — E1010 Type 1 diabetes mellitus with ketoacidosis without coma: Secondary | ICD-10-CM

## 2017-02-22 DIAGNOSIS — A4189 Other specified sepsis: Secondary | ICD-10-CM | POA: Diagnosis present

## 2017-02-22 LAB — CBG MONITORING, ED
GLUCOSE-CAPILLARY: 473 mg/dL — AB (ref 65–99)
GLUCOSE-CAPILLARY: 152 mg/dL — AB (ref 65–99)
GLUCOSE-CAPILLARY: 204 mg/dL — AB (ref 65–99)
GLUCOSE-CAPILLARY: 177 mg/dL — AB (ref 65–99)
GLUCOSE-CAPILLARY: 180 mg/dL — AB (ref 65–99)
GLUCOSE-CAPILLARY: 132 mg/dL — AB (ref 65–99)
Glucose-Capillary: 238 mg/dL — ABNORMAL HIGH (ref 65–99)
Glucose-Capillary: 132 mg/dL — ABNORMAL HIGH (ref 65–99)
Glucose-Capillary: 178 mg/dL — ABNORMAL HIGH (ref 65–99)
Glucose-Capillary: 110 mg/dL — ABNORMAL HIGH (ref 65–99)

## 2017-02-22 LAB — URINALYSIS, ROUTINE W REFLEX MICROSCOPIC
Bilirubin Urine: NEGATIVE
HGB URINE DIPSTICK: NEGATIVE
Ketones, ur: 5 mg/dL — AB
LEUKOCYTES UA: NEGATIVE
NITRITE: NEGATIVE
Protein, ur: >=300 mg/dL — AB
Specific Gravity, Urine: 1.016 (ref 1.005–1.030)
pH: 5 (ref 5.0–8.0)

## 2017-02-22 LAB — RESPIRATORY PANEL BY PCR
Adenovirus: NOT DETECTED
BORDETELLA PERTUSSIS-RVPCR: NOT DETECTED
CHLAMYDOPHILA PNEUMONIAE-RVPPCR: NOT DETECTED
CORONAVIRUS HKU1-RVPPCR: NOT DETECTED
Coronavirus 229E: NOT DETECTED
Coronavirus NL63: NOT DETECTED
Coronavirus OC43: NOT DETECTED
INFLUENZA A-RVPPCR: NOT DETECTED
INFLUENZA B-RVPPCR: NOT DETECTED
Metapneumovirus: NOT DETECTED
Mycoplasma pneumoniae: NOT DETECTED
PARAINFLUENZA VIRUS 3-RVPPCR: NOT DETECTED
PARAINFLUENZA VIRUS 4-RVPPCR: NOT DETECTED
Parainfluenza Virus 1: NOT DETECTED
Parainfluenza Virus 2: NOT DETECTED
RHINOVIRUS / ENTEROVIRUS - RVPPCR: NOT DETECTED
Respiratory Syncytial Virus: NOT DETECTED

## 2017-02-22 LAB — BASIC METABOLIC PANEL
Anion gap: 16 — ABNORMAL HIGH (ref 5–15)
Anion gap: 14 (ref 5–15)
Anion gap: 15 (ref 5–15)
Anion gap: 15 (ref 5–15)
BUN: 67 mg/dL — AB (ref 6–20)
BUN: 66 mg/dL — ABNORMAL HIGH (ref 6–20)
BUN: 65 mg/dL — ABNORMAL HIGH (ref 6–20)
BUN: 64 mg/dL — ABNORMAL HIGH (ref 6–20)
CALCIUM: 7.9 mg/dL — AB (ref 8.9–10.3)
CALCIUM: 8.4 mg/dL — AB (ref 8.9–10.3)
CALCIUM: 8.2 mg/dL — AB (ref 8.9–10.3)
CALCIUM: 8.1 mg/dL — AB (ref 8.9–10.3)
CHLORIDE: 102 mmol/L (ref 101–111)
CHLORIDE: 104 mmol/L (ref 101–111)
CO2: 16 mmol/L — AB (ref 22–32)
CO2: 20 mmol/L — AB (ref 22–32)
CO2: 19 mmol/L — AB (ref 22–32)
CO2: 17 mmol/L — AB (ref 22–32)
CREATININE: 10.27 mg/dL — AB (ref 0.44–1.00)
CREATININE: 9.91 mg/dL — AB (ref 0.44–1.00)
CREATININE: 10.47 mg/dL — AB (ref 0.44–1.00)
Chloride: 97 mmol/L — ABNORMAL LOW (ref 101–111)
Chloride: 100 mmol/L — ABNORMAL LOW (ref 101–111)
Creatinine, Ser: 10.09 mg/dL — ABNORMAL HIGH (ref 0.44–1.00)
GFR calc Af Amer: 5 mL/min — ABNORMAL LOW (ref >60)
GFR calc Af Amer: 5 mL/min — ABNORMAL LOW (ref >60)
GFR calc Af Amer: 5 mL/min — ABNORMAL LOW (ref >60)
GFR calc Af Amer: 5 mL/min — ABNORMAL LOW (ref >60)
GFR calc non Af Amer: 5 mL/min — ABNORMAL LOW (ref >60)
GFR calc non Af Amer: 5 mL/min — ABNORMAL LOW (ref >60)
GFR calc non Af Amer: 4 mL/min — ABNORMAL LOW (ref >60)
GFR calc non Af Amer: 5 mL/min — ABNORMAL LOW (ref >60)
GLUCOSE: 394 mg/dL — AB (ref 65–99)
GLUCOSE: 137 mg/dL — AB (ref 65–99)
GLUCOSE: 187 mg/dL — AB (ref 65–99)
GLUCOSE: 120 mg/dL — AB (ref 65–99)
Potassium: 4.4 mmol/L (ref 3.5–5.1)
Potassium: 4.9 mmol/L (ref 3.5–5.1)
Potassium: 4.5 mmol/L (ref 3.5–5.1)
Potassium: 4.9 mmol/L (ref 3.5–5.1)
Sodium: 129 mmol/L — ABNORMAL LOW (ref 135–145)
Sodium: 134 mmol/L — ABNORMAL LOW (ref 135–145)
Sodium: 136 mmol/L (ref 135–145)
Sodium: 136 mmol/L (ref 135–145)

## 2017-02-22 LAB — I-STAT CHEM 8, ED
BUN: 55 mg/dL — ABNORMAL HIGH (ref 6–20)
CALCIUM ION: 0.96 mmol/L — AB (ref 1.15–1.40)
CHLORIDE: 97 mmol/L — AB (ref 101–111)
Creatinine, Ser: 10.7 mg/dL — ABNORMAL HIGH (ref 0.44–1.00)
GLUCOSE: 515 mg/dL — AB (ref 65–99)
HCT: 33 % — ABNORMAL LOW (ref 36.0–46.0)
Hemoglobin: 11.2 g/dL — ABNORMAL LOW (ref 12.0–15.0)
POTASSIUM: 6.1 mmol/L — AB (ref 3.5–5.1)
SODIUM: 130 mmol/L — AB (ref 135–145)
TCO2: 21 mmol/L — ABNORMAL LOW (ref 22–32)

## 2017-02-22 LAB — INFLUENZA PANEL BY PCR (TYPE A & B)
INFLBPCR: NEGATIVE
Influenza A By PCR: NEGATIVE

## 2017-02-22 LAB — PROCALCITONIN: Procalcitonin: 68.61 ng/mL

## 2017-02-22 LAB — LACTIC ACID, PLASMA
LACTIC ACID, VENOUS: 2.4 mmol/L — AB (ref 0.5–1.9)
Lactic Acid, Venous: 3.7 mmol/L (ref 0.5–1.9)

## 2017-02-22 LAB — RAPID STREP SCREEN (MED CTR MEBANE ONLY): Streptococcus, Group A Screen (Direct): NEGATIVE

## 2017-02-22 LAB — MRSA PCR SCREENING: MRSA by PCR: NEGATIVE

## 2017-02-22 LAB — GLUCOSE, CAPILLARY
GLUCOSE-CAPILLARY: 108 mg/dL — AB (ref 65–99)
GLUCOSE-CAPILLARY: 110 mg/dL — AB (ref 65–99)

## 2017-02-22 MED ORDER — DM-GUAIFENESIN ER 30-600 MG PO TB12
1.0000 | ORAL_TABLET | Freq: Two times a day (BID) | ORAL | Status: DC | PRN
  Administered 2017-02-22 – 2017-02-23 (×2): 1 via ORAL
  Filled 2017-02-22 (×2): qty 1

## 2017-02-22 MED ORDER — SODIUM CHLORIDE 0.9 % IV BOLUS (SEPSIS)
500.0000 mL | Freq: Once | INTRAVENOUS | Status: AC
  Administered 2017-02-22: 500 mL via INTRAVENOUS

## 2017-02-22 MED ORDER — ALBUTEROL SULFATE (2.5 MG/3ML) 0.083% IN NEBU: 2.5000 mg | INHALATION_SOLUTION | RESPIRATORY_TRACT | Status: DC | PRN

## 2017-02-22 MED ORDER — ZOLPIDEM TARTRATE 5 MG PO TABS
5.0000 mg | ORAL_TABLET | Freq: Every evening | ORAL | Status: DC | PRN
  Administered 2017-02-22: 5 mg via ORAL
  Filled 2017-02-22: qty 1

## 2017-02-22 MED ORDER — SODIUM CHLORIDE 0.9 % IV SOLN: 62.5000 mg | INTRAVENOUS | Status: DC

## 2017-02-22 MED ORDER — AMLODIPINE BESYLATE 10 MG PO TABS
10.0000 mg | ORAL_TABLET | Freq: Every day | ORAL | Status: DC
  Administered 2017-02-22 – 2017-02-23 (×2): 10 mg via ORAL
  Filled 2017-02-22 (×2): qty 1

## 2017-02-22 MED ORDER — ONDANSETRON HCL 4 MG/2ML IJ SOLN
4.0000 mg | Freq: Three times a day (TID) | INTRAMUSCULAR | Status: DC | PRN
Start: 2017-02-22 — End: 2017-02-24

## 2017-02-22 MED ORDER — SODIUM CHLORIDE 0.9 % IV SOLN
INTRAVENOUS | Status: DC
  Administered 2017-02-23: 04:00:00 via INTRAVENOUS

## 2017-02-22 MED ORDER — HYDRALAZINE HCL 20 MG/ML IJ SOLN: 5.0000 mg | INTRAMUSCULAR | Status: DC | PRN

## 2017-02-22 MED ORDER — SODIUM CHLORIDE 0.9 % IV SOLN
INTRAVENOUS | Status: DC
  Administered 2017-02-22: 04:00:00 via INTRAVENOUS

## 2017-02-22 MED ORDER — DEXTROSE-NACL 5-0.45 % IV SOLN
INTRAVENOUS | Status: DC
  Administered 2017-02-22: 06:00:00 via INTRAVENOUS

## 2017-02-22 MED ORDER — INSULIN ASPART 100 UNIT/ML ~~LOC~~ SOLN
10.0000 [IU] | Freq: Once | SUBCUTANEOUS | Status: AC
  Administered 2017-02-22: 10 [IU] via INTRAVENOUS
  Filled 2017-02-22: qty 1

## 2017-02-22 MED ORDER — HEPARIN SODIUM (PORCINE) 5000 UNIT/ML IJ SOLN
5000.0000 [IU] | Freq: Three times a day (TID) | INTRAMUSCULAR | Status: DC
  Administered 2017-02-22 – 2017-02-24 (×7): 5000 [IU] via SUBCUTANEOUS
  Filled 2017-02-22 (×7): qty 1

## 2017-02-22 MED ORDER — OSELTAMIVIR PHOSPHATE 75 MG PO CAPS: 75.0000 mg | ORAL_CAPSULE | Freq: Two times a day (BID) | ORAL | Status: DC

## 2017-02-22 MED ORDER — INSULIN DETEMIR 100 UNIT/ML ~~LOC~~ SOLN
8.0000 [IU] | Freq: Every day | SUBCUTANEOUS | Status: DC
  Filled 2017-02-22: qty 0.08

## 2017-02-22 MED ORDER — INSULIN DETEMIR 100 UNIT/ML ~~LOC~~ SOLN
10.0000 [IU] | Freq: Two times a day (BID) | SUBCUTANEOUS | Status: DC
  Administered 2017-02-22 – 2017-02-24 (×4): 10 [IU] via SUBCUTANEOUS
  Filled 2017-02-22 (×5): qty 0.1

## 2017-02-22 MED ORDER — SODIUM CHLORIDE 0.9 % IV SOLN
2.0000 g | Freq: Once | INTRAVENOUS | Status: AC
  Administered 2017-02-22: 2 g via INTRAVENOUS
  Filled 2017-02-22: qty 20

## 2017-02-22 MED ORDER — AMLODIPINE BESYLATE 5 MG PO TABS
10.0000 mg | ORAL_TABLET | Freq: Every day | ORAL | Status: DC
  Filled 2017-02-22: qty 2

## 2017-02-22 MED ORDER — LOSARTAN POTASSIUM 25 MG PO TABS
25.0000 mg | ORAL_TABLET | Freq: Every day | ORAL | Status: DC
  Filled 2017-02-22: qty 1

## 2017-02-22 MED ORDER — LOSARTAN POTASSIUM 50 MG PO TABS
25.0000 mg | ORAL_TABLET | Freq: Every day | ORAL | Status: DC
  Administered 2017-02-22 – 2017-02-23 (×2): 25 mg via ORAL
  Filled 2017-02-22 (×3): qty 1

## 2017-02-22 MED ORDER — INSULIN ASPART 100 UNIT/ML ~~LOC~~ SOLN: 0.0000 [IU] | Freq: Three times a day (TID) | SUBCUTANEOUS | Status: DC

## 2017-02-22 MED ORDER — ACETAMINOPHEN 325 MG PO TABS
650.0000 mg | ORAL_TABLET | Freq: Four times a day (QID) | ORAL | Status: DC | PRN
  Administered 2017-02-22 – 2017-02-24 (×4): 650 mg via ORAL
  Filled 2017-02-22 (×4): qty 2

## 2017-02-22 MED ORDER — BUPROPION HCL ER (XL) 150 MG PO TB24
150.0000 mg | ORAL_TABLET | Freq: Every day | ORAL | Status: DC
  Administered 2017-02-22 – 2017-02-24 (×3): 150 mg via ORAL
  Filled 2017-02-22 (×3): qty 1

## 2017-02-22 MED ORDER — CALCITRIOL 0.25 MCG PO CAPS: 1.5000 ug | ORAL_CAPSULE | ORAL | Status: DC

## 2017-02-22 MED ORDER — SODIUM CHLORIDE 0.9 % IV SOLN
INTRAVENOUS | Status: DC
  Administered 2017-02-22: 4.1 [IU]/h via INTRAVENOUS
  Filled 2017-02-22: qty 1

## 2017-02-22 MED ORDER — INSULIN ASPART 100 UNIT/ML ~~LOC~~ SOLN
0.0000 [IU] | Freq: Three times a day (TID) | SUBCUTANEOUS | Status: DC
  Administered 2017-02-23: 2 [IU] via SUBCUTANEOUS

## 2017-02-22 MED ORDER — INSULIN ASPART 100 UNIT/ML ~~LOC~~ SOLN
0.0000 [IU] | SUBCUTANEOUS | Status: DC
  Administered 2017-02-22: 2 [IU] via SUBCUTANEOUS
  Filled 2017-02-22: qty 1

## 2017-02-22 NOTE — H&P (Signed)
History and Physical    Christine Reed Christine Reed:[id number] DOB: [birth year] DOA: 02/21/2017  Referring MD/NP/PA:   PCP: Merrilee Seashore, MD   Patient coming from:  The patient is coming from home.  At baseline, pt is independent for most of ADL.  Chief Complaint: Flulike symptoms  HPI: Christine Reed is a 32 y.o. female with medical history significant of hypertension, type 1 diabetes, DKA, GERD, depression, anemia, ESRD-HD (MWF), who presents with flulike symptoms.  Patient states that she started having flulike symptoms since yesterday, including fever, chill, cough, sore throat, nausea, vomiting. She coughs up yellow colored mucus. No CP or SOB. She vomited at least 5 times, no blood in the vomitus. She had one loose stool bowel movement. Currently no diarrhea or abdominal pain. Patient states that she feels so weak that she missed dialysis on Friday. Patient denies symptoms of UTI or unilateral weakness. Of note, patient's son has been sick recently with similar symptoms except for lack of nausea or vomiting.  ED Course: pt was found to have WBC 16.5, lactic acid 1.71, positive Flu PCR for Flu A, negative pregnancy test, potassium 6.1, creatinine 10.7, BUN 55, bicarbonate 21, elevated anion gap 17, temperature 100.1, tachycardia, tachypnea, oxygen saturation 95% on room air, chest x-ray negative. Pt is admitted to SDU as inpt.  Review of Systems:   General: has fevers, chills, no body weight gain, has poor appetite, has fatigue HEENT: no blurry vision, hearing changes or sore throat Respiratory: no dyspnea, has coughing, no wheezing CV: no chest pain, no palpitations GI: has nausea, vomiting, no abdominal pain, diarrhea, constipation GU: no dysuria, burning on urination, increased urinary frequency, hematuria  Ext: has leg edema Neuro: no unilateral weakness, numbness, or tingling, no vision change or hearing loss Skin: no rash, no skin tear. MSK: No muscle spasm, no deformity, no  limitation of range of movement in spin Heme: No easy bruising.  Travel history: No recent long distant travel.  Allergy:  Allergies  Allergen Reactions  . Ivp Dye [Iodinated Diagnostic Agents] Other (See Comments)    Burning   . Iodine Rash  . Nickel Rash    Past Medical History:  Diagnosis Date  . Anemia   . Chronic kidney disease   . Diabetes mellitus    diagnosed at age 20; Type 1  . Diabetic retinopathy ([place])   . GERD (gastroesophageal reflux disease)   . Hypertension     Past Surgical History:  Procedure Laterality Date  . AV FISTULA PLACEMENT Right 10/10/2016   Procedure: CREATION OF RIGHT ARM RADIOCEPHALIC ARTERIOVENOUS (AV) FISTULA;  Surgeon: Serafina Mitchell, MD;  Location: [facility];  Service: Vascular;  Laterality: Right;  . CESAREAN SECTION N/A 10/19/2015   Procedure: CESAREAN SECTION;  Surgeon: Chancy Milroy, MD;  Location: [facility];  Service: Obstetrics;  Laterality: N/A;  . lipo suction  2015  . TONSILLECTOMY    . TONSILLECTOMY AND ADENOIDECTOMY      Social History:  reports that  has never smoked. she has never used smokeless tobacco. She reports that she does not drink alcohol or use drugs.  Family History:  Family History  Problem Relation Age of Onset  . Cancer Maternal Grandmother        colon and breast  . Diabetes Maternal Grandfather   . Diabetes Mother   . Diabetes Father   . Diabetes Paternal Grandmother   . Diabetes Paternal Grandfather      Prior to Admission medications  Medication Sig Start Date End Date Taking? Authorizing Provider  amLODipine (NORVASC) 10 MG tablet TAKE ONE TABLET BY MOUTH ONCE DAILY 04/16/16  Yes Kathrine Haddock N, CNM  buPROPion (WELLBUTRIN XL) 150 MG 24 hr tablet Take 150 mg daily by mouth.   Yes [provider]  insulin aspart (NOVOLOG) 100 UNIT/ML injection Inject 0-20 Units 3 (three) times daily before meals into the skin. Sliding scale 12/27/16  Yes Joy, Shawn C, PA-C  Insulin Detemir (LEVEMIR)  100 UNIT/ML Pen Inject 10 units in the morning and inject 11 units in the evening. Patient taking differently: Inject 10-11 Units into the skin See admin instructions. Inject 10 units in the morning and inject 11 units in the evening. 01/13/17  Yes Elayne Snare, MD  LOSARTAN POTASSIUM PO Take 1 tablet by mouth daily.   Yes [provider]  ACCU-CHEK FASTCLIX LANCETS MISC As directed 12/09/16   Elayne Snare, MD  Blood Glucose Monitoring Suppl (ACCU-CHEK AVIVA PLUS) w/Device KIT Use to check blood sugars 5 times daily Patient taking differently: 1 each by Other route 4 (four) times daily. Use to check blood sugars 3-4 times daily 09/09/16   Elayne Snare, MD  glucose blood (ACCU-CHEK AVIVA PLUS) test strip Use to check blood sugars four times daily 11/29/16   Elayne Snare, MD  Insulin Glargine (TOUJEO SOLOSTAR) 300 UNIT/ML SOPN Inject 26 Units into the skin daily. Patient not taking: Reported on 12/27/2016 11/29/16   Elayne Snare, MD    Physical Exam: Vitals:   02/22/17 0245 02/22/17 0315 02/22/17 0345 02/22/17 0425  BP: 135/84 128/79 127/79   Pulse: 99 (!) 107 (!) 101   Resp: (!) 21 (!) 23 19   Temp:      TempSrc:      SpO2: 95% 96% 96%   Weight:    67.6 kg (149 lb)  Height:    '5\' 5"'  (1.651 m)   General: Not in acute distress HEENT:       Eyes: PERRL, EOMI, no scleral icterus.       ENT: No discharge from the ears and nose, has pharynx injection, no tonsillar enlargement.        Neck: No JVD, no bruit, no mass felt. Heme: No neck lymph node enlargement. Cardiac: S1/S2, RRR, No murmurs, No gallops or rubs. Respiratory:  No rales, wheezing, rhonchi or rubs. GI: Soft, nondistended, nontender, no rebound pain, no organomegaly, BS present. GU: No hematuria Ext: 1+ pitting leg edema bilaterally. 2+DP/PT pulse bilaterally. Musculoskeletal: No joint deformities, No joint redness or warmth, no limitation of ROM in spin. Skin: No rashes.  Neuro: Alert, oriented X3, cranial nerves II-XII  grossly intact, moves all extremities normally. Psych: Patient is not psychotic, no suicidal or hemocidal ideation.  Labs on Admission: I have personally reviewed following labs and imaging studies  CBC: Recent Labs  Lab 02/21/17 1416 02/22/17 0232  WBC 16.5*  --   NEUTROABS 13.6*  --   HGB 12.3 11.2*  HCT 35.3* 33.0*  MCV 88.5  --   PLT 269  --    Basic Metabolic Panel: Recent Labs  Lab 02/21/17 1416 02/22/17 0232  NA 133* 130*  K 4.9 6.1*  CL 95* 97*  CO2 21*  --   GLUCOSE 366* 515*  BUN 53* 55*  CREATININE 8.96* 10.70*  CALCIUM 8.4*  --    GFR: Estimated Creatinine Clearance: 7 mL/min (A) (by C-G formula based on SCr of 10.7 mg/dL (H)). Liver Function Tests: Recent Labs  Lab  02/21/17 1416  AST 30  ALT 29  ALKPHOS 99  BILITOT 1.0  PROT 7.1  ALBUMIN 3.4*   No results for input(s): LIPASE, AMYLASE in the last 168 hours. No results for input(s): AMMONIA in the last 168 hours. Coagulation Profile: No results for input(s): INR, PROTIME in the last 168 hours. Cardiac Enzymes: No results for input(s): CKTOTAL, CKMB, CKMBINDEX, TROPONINI in the last 168 hours. BNP (last 3 results) No results for input(s): PROBNP in the last 8760 hours. HbA1C: No results for input(s): HGBA1C in the last 72 hours. CBG: Recent Labs  Lab 02/21/17 2225 02/22/17 0323  GLUCAP 421* 473*   Lipid Profile: No results for input(s): CHOL, HDL, LDLCALC, TRIG, CHOLHDL, LDLDIRECT in the last 72 hours. Thyroid Function Tests: No results for input(s): TSH, T4TOTAL, FREET4, T3FREE, THYROIDAB in the last 72 hours. Anemia Panel: No results for input(s): VITAMINB12, FOLATE, FERRITIN, TIBC, IRON, RETICCTPCT in the last 72 hours. Urine analysis:    Component Value Date/Time   COLORURINE YELLOW 12/27/2016 [place] 12/27/2016 1207   LABSPEC 1.013 12/27/2016 1207   PHURINE 9.0 (H) 12/27/2016 1207   GLUCOSEU >=500 (A) 12/27/2016 1207   HGBUR NEGATIVE 12/27/2016 1207    [place] 12/27/2016 1207   [place] 12/27/2016 1207   PROTEINUR >=300 (A) 12/27/2016 1207   UROBILINOGEN 0.2 03/01/2016 1421   NITRITE NEGATIVE 12/27/2016 1207   LEUKOCYTESUR NEGATIVE 12/27/2016 1207   Sepsis Labs: '@LABRCNTIP' (procalcitonin:4,lacticidven:4) )No results found for this or any previous visit (from the past 240 hour(s)).   Radiological Exams on Admission: Dg Chest 2 View  Result Date: 02/21/2017 CLINICAL DATA:  Cough and fever.  Renal failure. EXAM: CHEST  2 VIEW COMPARISON:  March 01, 2016 FINDINGS: Central catheter tip is at the cavoatrial junction. No pneumothorax. Lungs are clear. Heart size and pulmonary vascularity are normal. No adenopathy. No bone lesions. IMPRESSION: Central catheter tip at cavoatrial junction. No pneumothorax. Lungs clear. Electronically Signed   By: Lowella Grip III M.D.   On: 02/21/2017 14:46     EKG: Independently reviewed.  Sinus tachycardia, QTC 480, LAE, low voltage, mild T-wave peaking in II, V4-V6   Assessment/Plan Principal Problem:   DKA (diabetic ketoacidoses) (HCC) Active Problems:   Type 1 diabetes mellitus with ketoacidosis without coma (HCC)   Anemia, chronic disease   Nausea & vomiting   Flu-like symptoms   Sepsis ([place])   ESRD on hemodialysis ([place])   Uremia   DKA (diabetic ketoacidoses) ([place]): Initial BMP showed AG 17, the repeated BMP showed AG of 12, but blood sugar has increased up to 515. Will treat pt as DAK. This is likely triggered by viral infection.  - Admit to stepdown  - 250 mL of NS bolus in ED - start DKA protocol with BMP q4h - IVF: NS 75 cc/h; will switch to D5-1/2NS when CBG<250 - replete K as needed - Zofran prn nausea  - NPO   Flu-like symptoms and sepsis: pt has sick contact with her son, who has similar symptoms, likely due to viral infection. Flu pcr negagive, chest x-ray negative. Patient admits criteria for sepsis with leukocytosis, tachycardia and tachypnea. Lactic  acid is normal. Since patient is dialysis patient, will give aggressive IV fluids treatment. We'll hold antibiotics now. -Supportive care -IV fluid as above -When necessary Zofran for nausea -Tylenol for fever and pain -f/u spiratory virus panel and rapid strep screen test  Type 1 diabetes mellitus with ketoacidosis without coma ([place]): Last A1c 8.7,  poorly controled. Patient is taking Levemir and NovoLog at home. -On DKA protocol now  Anemia, chronic disease: hgb stable, 11.2 -f/u by CBC  Nausea & vomiting: possibley due to viral infection -prn zofran -check lipase   ESRD on hemodialysis (MWF) and Uremia: [name] on Friday. -EDP will consult renal for HD.  Hyperkalemia: Potassium 6.1 with mild T-wave peaking -treated with 2 g of calcium chloride -on insulin gtt, expecting improvement   DVT ppx: SQ Heparin     Code Status: Full code Family Communication: None at bed side.       Disposition Plan:  Anticipate discharge back to previous home environment Consults called:  none Admission status:    SDU/inpation       Date of Service 02/22/2017    Ivor Costa [facility] Pager [phone]  If 7PM-7AM, please contact night-coverage [email or web address] Password [facility] 02/22/2017, 4:59 AM

## 2017-02-22 NOTE — ED Notes (Signed)
Attempted secondary IV access x1, vein blew.

## 2017-02-22 NOTE — ED Notes (Signed)
Paged MD regarding glucostabilizer orders. Glucostabilizer wants to increase insulin to 1.4, per Dr. Quincy Simmonds ok to follow this order and will give levemir when it arrives from pharmacy. Will recheck CBG in 1 hour.

## 2017-02-22 NOTE — Progress Notes (Signed)
Patient lying in bed, complained of mid-back pain, medicated with tylenol.  Temperature orally is 99.6.  Right subclavian HDport intact, gauze dress present with occlusive, transparent dressing over top.  Right arm AV fistula positive bruit and thrill.  Left forearm PIV patient and intact. Normal saline infusing at 75 ml/hr.  Coughing noted (nonproductive) dry sounding, Mucinex given per prn order along with Ambien for insomnia.  Safety and comfort measures maintained.  Call bell within reach.  Will continue to monitor.

## 2017-02-22 NOTE — ED Notes (Signed)
Pt lunch tray ordered.

## 2017-02-22 NOTE — Consult Note (Signed)
[facility] Renal Consultation Note  Indication for Consultation:  Management of ESRD/hemodialysis; anemia, hypertension/volume and secondary hyperparathyroidism  HPI: Christine Reed is a 32 y.o. female with  ESRD 2/2 DM type 1 (dx 2000) //HTN( first HD 11/13/16 at [facility]),high risk preg with Csection/pre-eclampsia at 27 weeks - 10/2015 ,sickle cell trait, situational anxiety( on Wellbutrin)  Admitted with DKA, Flu like syndrome  = N/V, sore throat, chills,cough dry  (young son with similar symptoms), [name] yest sec symptoms  ,K=6.1 in Er  Bs and calcium tx with K this am 4.9 .Denies sob, cxr =no vol issues or on exam.     Currently in ER  Denying sob, cp or abdominal pain, tolerating po fluids.  BS now 137.She reports last HD without problems using RFA AVF. Denying uti symptoms ,comntiues to make urine. Noted compliant with OP HD schedule and contemplating   Home HD with dw With Dr Joelyn Oms .      Past Medical History:  Diagnosis Date  . Anemia   . Chronic kidney disease   . Diabetes mellitus    diagnosed at age 8; Type 1  . Diabetic retinopathy ([place])   . GERD (gastroesophageal reflux disease)   . Hypertension     Past Surgical History:  Procedure Laterality Date  . AV FISTULA PLACEMENT Right 10/10/2016   Procedure: CREATION OF RIGHT ARM RADIOCEPHALIC ARTERIOVENOUS (AV) FISTULA;  Surgeon: Serafina Mitchell, MD;  Location: [facility];  Service: Vascular;  Laterality: Right;  . CESAREAN SECTION N/A 10/19/2015   Procedure: CESAREAN SECTION;  Surgeon: Chancy Milroy, MD;  Location: [facility];  Service: Obstetrics;  Laterality: N/A;  . lipo suction  2015  . TONSILLECTOMY    . TONSILLECTOMY AND ADENOIDECTOMY        Family History  Problem Relation Age of Onset  . Cancer Maternal Grandmother        colon and breast  . Diabetes Maternal Grandfather   . Diabetes Mother   . Diabetes Father   . Diabetes Paternal Grandmother   . Diabetes Paternal Grandfather        reports that  has never smoked. she has never used smokeless tobacco. She reports that she does not drink alcohol or use drugs.   Allergies  Allergen Reactions  . Ivp Dye [Iodinated Diagnostic Agents] Other (See Comments)    Burning   . Iodine Rash  . Nickel Rash    Prior to Admission medications   Medication Sig Start Date End Date Taking? Authorizing Provider  amLODipine (NORVASC) 10 MG tablet TAKE ONE TABLET BY MOUTH ONCE DAILY 04/16/16  Yes Kathrine Haddock N, CNM  buPROPion (WELLBUTRIN XL) 150 MG 24 hr tablet Take 150 mg daily by mouth.   Yes [provider]  insulin aspart (NOVOLOG) 100 UNIT/ML injection Inject 0-20 Units 3 (three) times daily before meals into the skin. Sliding scale 12/27/16  Yes Joy, Shawn C, PA-C  Insulin Detemir (LEVEMIR) 100 UNIT/ML Pen Inject 10 units in the morning and inject 11 units in the evening. Patient taking differently: Inject 10-11 Units into the skin See admin instructions. Inject 10 units in the morning and inject 11 units in the evening. 01/13/17  Yes Elayne Snare, MD  losartan (COZAAR) 25 MG tablet Take 25 mg by mouth at bedtime.    Yes [provider]  [place] As directed 12/09/16   Elayne Snare, MD  Blood Glucose Monitoring Suppl (ACCU-CHEK AVIVA PLUS) w/Device KIT Use to  check blood sugars 5 times daily Patient taking differently: 1 each by Other route 4 (four) times daily. Use to check blood sugars 3-4 times daily 09/09/16   Elayne Snare, MD  glucose blood (ACCU-CHEK AVIVA PLUS) test strip Use to check blood sugars four times daily 11/29/16   Elayne Snare, MD  Insulin Glargine (TOUJEO SOLOSTAR) 300 UNIT/ML SOPN Inject 26 Units into the skin daily. Patient not taking: Reported on 12/27/2016 11/29/16   Elayne Snare, MD      Results for orders placed or performed during the hospital encounter of 02/21/17 (from the past 48 hour(s))  Influenza panel by PCR (type A & B)     Status: None   Collection Time: 02/21/17  12:46 AM  Result Value Ref Range   Influenza A By PCR NEGATIVE NEGATIVE   Influenza B By PCR NEGATIVE NEGATIVE    Comment: (NOTE) The Xpert Xpress Flu assay is intended as an aid in the diagnosis of  influenza and should not be used as a sole basis for treatment.  This  assay is FDA approved for nasopharyngeal swab specimens only. Nasal  washings and aspirates are unacceptable for Xpert Xpress Flu testing.   Comprehensive metabolic panel     Status: Abnormal   Collection Time: 02/21/17  2:16 PM  Result Value Ref Range   Sodium 133 (L) 135 - 145 mmol/L   Potassium 4.9 3.5 - 5.1 mmol/L   Chloride 95 (L) 101 - 111 mmol/L   CO2 21 (L) 22 - 32 mmol/L   Glucose, Bld 366 (H) 65 - 99 mg/dL   BUN 53 (H) 6 - 20 mg/dL   Creatinine, Ser 8.96 (H) 0.44 - 1.00 mg/dL   Calcium 8.4 (L) 8.9 - 10.3 mg/dL   Total Protein 7.1 6.5 - 8.1 g/dL   Albumin 3.4 (L) 3.5 - 5.0 g/dL   AST 30 15 - 41 U/L   ALT 29 14 - 54 U/L   Alkaline Phosphatase 99 38 - 126 U/L   Total Bilirubin 1.0 0.3 - 1.2 mg/dL   GFR calc non Af Amer 5 (L) >60 mL/min   GFR calc Af Amer 6 (L) >60 mL/min    Comment: (NOTE) The eGFR has been calculated using the CKD EPI equation. This calculation has not been validated in all clinical situations. eGFR's persistently <60 mL/min signify possible Chronic Kidney Disease.    Anion gap 17 (H) 5 - 15  CBC with Differential     Status: Abnormal   Collection Time: 02/21/17  2:16 PM  Result Value Ref Range   WBC 16.5 (H) 4.0 - 10.5 K/uL   RBC 3.99 3.87 - 5.11 MIL/uL   Hemoglobin 12.3 12.0 - 15.0 g/dL   HCT 35.3 (L) 36.0 - 46.0 %   MCV 88.5 78.0 - 100.0 fL   MCH 30.8 26.0 - 34.0 pg   MCHC 34.8 30.0 - 36.0 g/dL   RDW 15.8 (H) 11.5 - 15.5 %   Platelets 269 150 - 400 K/uL   Neutrophils Relative % 83 %   Neutro Abs 13.6 (H) 1.7 - 7.7 K/uL   Lymphocytes Relative 8 %   Lymphs Abs 1.3 0.7 - 4.0 K/uL   Monocytes Relative 9 %   Monocytes Absolute 1.6 (H) 0.1 - 1.0 K/uL   Eosinophils Relative 0  %   Eosinophils Absolute 0.0 0.0 - 0.7 K/uL   Basophils Relative 0 %   Basophils Absolute 0.0 0.0 - 0.1 K/uL  I-Stat CG4 Lactic Acid, ED  Status: None   Collection Time: 02/21/17  2:33 PM  Result Value Ref Range   Lactic Acid, Venous 1.71 0.5 - 1.9 mmol/L  I-Stat beta hCG blood, ED     Status: None   Collection Time: 02/21/17  2:43 PM  Result Value Ref Range   I-stat hCG, quantitative <5.0 <5 mIU/mL   Comment 3            Comment:   GEST. AGE      CONC.  (mIU/mL)   <=1 WEEK        5 - 50     2 WEEKS       50 - 500     3 WEEKS       100 - 10,000     4 WEEKS     1,000 - 30,000        FEMALE AND NON-PREGNANT FEMALE:     LESS THAN 5 mIU/mL   CBG monitoring, ED     Status: Abnormal   Collection Time: 02/21/17 10:25 PM  Result Value Ref Range   Glucose-Capillary 421 (H) 65 - 99 mg/dL  I-stat chem 8, ed     Status: Abnormal   Collection Time: 02/22/17  2:32 AM  Result Value Ref Range   Sodium 130 (L) 135 - 145 mmol/L   Potassium 6.1 (H) 3.5 - 5.1 mmol/L   Chloride 97 (L) 101 - 111 mmol/L   BUN 55 (H) 6 - 20 mg/dL   Creatinine, Ser 10.70 (H) 0.44 - 1.00 mg/dL   Glucose, Bld 515 (HH) 65 - 99 mg/dL   Calcium, Ion 0.96 (L) 1.15 - 1.40 mmol/L   TCO2 21 (L) 22 - 32 mmol/L   Hemoglobin 11.2 (L) 12.0 - 15.0 g/dL   HCT 33.0 (L) 36.0 - 46.0 %   Comment NOTIFIED PHYSICIAN   CBG monitoring, ED     Status: Abnormal   Collection Time: 02/22/17  3:23 AM  Result Value Ref Range   Glucose-Capillary 473 (H) 65 - 99 mg/dL  Lactic acid, plasma     Status: Abnormal   Collection Time: 02/22/17  3:54 AM  Result Value Ref Range   Lactic Acid, Venous 3.7 (HH) 0.5 - 1.9 mmol/L    Comment: CRITICAL RESULT CALLED TO, READ BACK BY AND VERIFIED WITH: Kasandra Knudsen [phone] [facility]   Procalcitonin     Status: None   Collection Time: 02/22/17  3:54 AM  Result Value Ref Range   Procalcitonin 68.61 ng/mL    Comment:        Interpretation: PCT >= 10 ng/mL: Important systemic inflammatory  response, almost exclusively due to severe bacterial sepsis or septic shock. (NOTE)       Sepsis PCT Algorithm           Lower Respiratory Tract                                      Infection PCT Algorithm    ----------------------------     ----------------------------         PCT < 0.25 ng/mL                PCT < 0.10 ng/mL         Strongly encourage             Strongly discourage   discontinuation of antibiotics    initiation of antibiotics    ----------------------------     -----------------------------  PCT 0.25 - 0.50 ng/mL            PCT 0.10 - 0.25 ng/mL               OR       >80% decrease in PCT            Discourage initiation of                                            antibiotics      Encourage discontinuation           of antibiotics    ----------------------------     -----------------------------         PCT >= 0.50 ng/mL              PCT 0.26 - 0.50 ng/mL                AND       <80% decrease in PCT             Encourage initiation of                                             antibiotics       Encourage continuation           of antibiotics    ----------------------------     -----------------------------        PCT >= 0.50 ng/mL                  PCT > 0.50 ng/mL               AND         increase in PCT                  Strongly encourage                                      initiation of antibiotics    Strongly encourage escalation           of antibiotics                                     -----------------------------                                           PCT <= 0.25 ng/mL                                                 OR                                        > 80% decrease in PCT  Discontinue / Do not initiate                                             antibiotics   Basic metabolic panel     Status: Abnormal   Collection Time: 02/22/17  3:54 AM  Result Value Ref Range   Sodium 129 (L) 135 - 145  mmol/L   Potassium 4.4 3.5 - 5.1 mmol/L    Comment: NO VISIBLE HEMOLYSIS   Chloride 97 (L) 101 - 111 mmol/L   CO2 16 (L) 22 - 32 mmol/L   Glucose, Bld 394 (H) 65 - 99 mg/dL   BUN 67 (H) 6 - 20 mg/dL   Creatinine, Ser 10.27 (H) 0.44 - 1.00 mg/dL   Calcium 7.9 (L) 8.9 - 10.3 mg/dL   GFR calc non Af Amer 5 (L) >60 mL/min   GFR calc Af Amer 5 (L) >60 mL/min    Comment: (NOTE) The eGFR has been calculated using the CKD EPI equation. This calculation has not been validated in all clinical situations. eGFR's persistently <60 mL/min signify possible Chronic Kidney Disease.    Anion gap 16 (H) 5 - 15  CBG monitoring, ED     Status: Abnormal   Collection Time: 02/22/17  5:57 AM  Result Value Ref Range   Glucose-Capillary 238 (H) 65 - 99 mg/dL  Rapid Strep Screen (Not at [facility])     Status: None   Collection Time: 02/22/17  6:10 AM  Result Value Ref Range   Streptococcus, Group A Screen (Direct) NEGATIVE NEGATIVE    Comment: (NOTE) A Rapid Antigen test may result negative if the antigen level in the sample is below the detection level of this test. The FDA has not cleared this test as a stand-alone test therefore the rapid antigen negative result has reflexed to a Group A Strep culture.   Lactic acid, plasma     Status: Abnormal   Collection Time: 02/22/17  6:26 AM  Result Value Ref Range   Lactic Acid, Venous 2.4 (HH) 0.5 - 1.9 mmol/L    Comment: CRITICAL RESULT CALLED TO, READ BACK BY AND VERIFIED WITHSuan Halter [phone] [facility]   CBG monitoring, ED     Status: Abnormal   Collection Time: 02/22/17  6:59 AM  Result Value Ref Range   Glucose-Capillary 152 (H) 65 - 99 mg/dL  Basic metabolic panel     Status: Abnormal   Collection Time: 02/22/17  7:45 AM  Result Value Ref Range   Sodium 134 (L) 135 - 145 mmol/L   Potassium 4.9 3.5 - 5.1 mmol/L   Chloride 100 (L) 101 - 111 mmol/L   CO2 20 (L) 22 - 32 mmol/L   Glucose, Bld 137 (H) 65 - 99 mg/dL   BUN 66 (H) 6 - 20 mg/dL    Creatinine, Ser 9.91 (H) 0.44 - 1.00 mg/dL   Calcium 8.4 (L) 8.9 - 10.3 mg/dL   GFR calc non Af Amer 5 (L) >60 mL/min   GFR calc Af Amer 5 (L) >60 mL/min    Comment: (NOTE) The eGFR has been calculated using the CKD EPI equation. This calculation has not been validated in all clinical situations. eGFR's persistently <60 mL/min signify possible Chronic Kidney Disease.    Anion gap 14 5 - 15  CBG monitoring, ED     Status: Abnormal   Collection  Time: 02/22/17  7:54 AM  Result Value Ref Range   Glucose-Capillary 132 (H) 65 - 99 mg/dL  Urinalysis, Routine w reflex microscopic     Status: Abnormal   Collection Time: 02/22/17  7:56 AM  Result Value Ref Range   Color, Urine YELLOW YELLOW   APPearance HAZY (A) CLEAR   Specific Gravity, Urine 1.016 1.005 - 1.030   pH 5.0 5.0 - 8.0   Glucose, UA >=500 (A) NEGATIVE mg/dL   Hgb urine dipstick NEGATIVE NEGATIVE   Bilirubin Urine NEGATIVE NEGATIVE   Ketones, ur 5 (A) NEGATIVE mg/dL   Protein, ur >=300 (A) NEGATIVE mg/dL   Nitrite NEGATIVE NEGATIVE   Leukocytes, UA NEGATIVE NEGATIVE   RBC / HPF 0-5 0 - 5 RBC/hpf   WBC, UA 0-5 0 - 5 WBC/hpf   Bacteria, UA RARE (A) NONE SEEN   Squamous Epithelial / LPF 0-5 (A) NONE SEEN   Budding Yeast PRESENT    Granular Casts, UA PRESENT   CBG monitoring, ED     Status: Abnormal   Collection Time: 02/22/17  9:17 AM  Result Value Ref Range   Glucose-Capillary 204 (H) 65 - 99 mg/dL     ROS: see hpi   Physical Exam: Vitals:   02/22/17 0756 02/22/17 0900  BP: (!) 126/96 134/84  Pulse: 97 95  Resp: 20 19  Temp: 98.8 F (37.1 C)   SpO2: 97% 97%     General: alert [place] AAF, NAD , OX4  Appropriate , WD, WN  HEENT: [place] , MMM, EOMI , Non icteric  Neck: supple, no jvd Heart: Tachy reg  No rub, mur gallop Lungs: CTA  bilat , non labored breathing  Abdomen: Soft , nt, nd  bs pos  Extremities: No pedal edema Skin: No overt rash , warm dry Neuro: Alert OX4 , Moves all extrem ,no acute focal deficits  appreciated  Dialysis Access: Pos bruit RFA AVF   OP Dialysis Orders: Center: [facility] MWF . EDW 65kg Bath 2k,2.5ca  Time 4hr . Access [email or web address]     Heparin 2400 Calcitriol 1.5 mcg po q HD Mircera 150 mcg  q 2 wks (last on 02/12/17 ) Venofer 50 mg q wkly hd    Assessment/Plan 1. DKA (ho DM type 1)- per addmit 2. Viral Syndrome N/V - per admit 3. Hyperkalemia - missed hd and ^ BS =K6.1  On admit yest  Improved 4.9  With meds  HD today  4. ESRD -  Missed last hd (02/21/17) MWF schedule hd today  Reck labs pre hd 5. Hypertension/volume  - Norvasc 10 mg  hs  / Losartan 25 mg hs   At home / cxr shows no excess vol  Or on exam 6. Anemia  - hgb 11.2 no esa need this week (last given 02/12/17 ) fu hgb trend weekly fe on hd weds. 7. Metabolic bone disease -  Po vit d on hd / no Phos  binder as op (pth last 28 )  Fu phos /ca    Ernest Haber, PA-C [place] [phone] 02/22/2017, 10:01 AM   Pt seen, examined and agree w A/P as above. ESRD pt w/ viral syndrome and DKA.  K improved w/ insulin as expected.  Due to staffing issues will not get to HD today, plan HD tomorrow. No serious electrolyte or vol issues.  Will follow.  Kelly Splinter MD [employer] pager [phone]   02/22/2017, 7:41 PM

## 2017-02-22 NOTE — ED Notes (Signed)
Pt ambulated to restroom without difficulty

## 2017-02-22 NOTE — ED Notes (Signed)
Date and time results received: 02/22/17 5:20 AM (use smartphrase ".now" to insert current time)  Test: Lactic Acid  Critical Value: 3.7  Name of Provider Notified: Dr. Blaine Hamper  Orders Received? Or Actions Taken?: see new orders, plan for 250 ML bolus NS

## 2017-02-22 NOTE — ED Notes (Signed)
Carb modified breakfast tray ordered @ 1002

## 2017-02-22 NOTE — ED Notes (Signed)
Per glucostablizer, Rate stays at 1.2

## 2017-02-22 NOTE — Progress Notes (Signed)
[facility]   Patient admitted after midnight see H&P for details   Patient seen, chart reviewed. Patient is 33 y/o F with complicated medical hx, who was admitted for DKA in setting of viral infection. Patient was given IV and insulin ggt. Glucose has improved, anion gap is closed and patient clinically better. Patient [name] yesterday.   Impression & Plan: DKA Transition to subcutaneous insulin, keep insulin ggt for 2 hr after receiving Lantus. Start SSI. Advance diet to carb modify. Continue to monitor CBG's. When patient tolerating diet and off insulin ggt can d/c IVF   ESRD  HD per renal - for dialysis today  Rest per H&P   Chipper Oman, MD

## 2017-02-23 LAB — URINE CULTURE

## 2017-02-23 LAB — GLUCOSE, CAPILLARY
GLUCOSE-CAPILLARY: 80 mg/dL (ref 65–99)
GLUCOSE-CAPILLARY: 236 mg/dL — AB (ref 65–99)
Glucose-Capillary: 48 mg/dL — ABNORMAL LOW (ref 65–99)
Glucose-Capillary: 138 mg/dL — ABNORMAL HIGH (ref 65–99)
Glucose-Capillary: 60 mg/dL — ABNORMAL LOW (ref 65–99)
Glucose-Capillary: 422 mg/dL — ABNORMAL HIGH (ref 65–99)

## 2017-02-23 MED ORDER — INSULIN ASPART 100 UNIT/ML ~~LOC~~ SOLN
8.0000 [IU] | Freq: Once | SUBCUTANEOUS | Status: AC
  Administered 2017-02-23: 8 [IU] via SUBCUTANEOUS

## 2017-02-23 MED ORDER — PNEUMOCOCCAL VAC POLYVALENT 25 MCG/0.5ML IJ INJ: 0.5000 mL | INJECTION | INTRAMUSCULAR | Status: DC

## 2017-02-23 NOTE — Progress Notes (Signed)
[facility]   Patient seen and examined - has no complaints   VSS  She has significantly improved, glucose are much better. Tolerating diet well. Patient remains afebrile since hospitalization. Viral panel negative. Patient due for HD today. If tolerated well with no issues, can be d/c after HD. See d/c summary for details.   Chipper Oman, MD

## 2017-02-23 NOTE — Progress Notes (Signed)
Patient resting peacefully. Normal saline continues to infuse via left arm PIV.  No signs or symptoms of infiltration noted.  Will continue to monitor patient. Call bell remains within reach.

## 2017-02-23 NOTE — Progress Notes (Signed)
[facility] Progress Note  Subjective: feeling much better  Vitals:   02/23/17 0715 02/23/17 0800 02/23/17 0900 02/23/17 1200  BP:  130/84 118/74 (!) 143/87  Pulse: (!) 101 99 99 96  Resp: (!) 7 17 14 17   Temp: 99.8 F (37.7 C)     TempSrc: Oral     SpO2: 95% 96% 96% 96%  Weight:      Height:        Inpatient medications: . amLODipine  10 mg Oral QHS  . buPROPion  150 mg Oral Daily  . [START ON 02/24/2017] calcitRIOL  1.5 mcg Oral Q M,W,F-HD  . heparin  5,000 Units Subcutaneous Q8H  . insulin aspart  0-15 Units Subcutaneous TID WC  . insulin detemir  10 Units Subcutaneous BID  . losartan  25 mg Oral QHS  . [START ON 02/24/2017] pneumococcal 23 valent vaccine  0.5 mL Intramuscular Tomorrow-1000   . [START ON 02/26/2017] ferric gluconate (FERRLECIT/NULECIT) IV     acetaminophen, albuterol, dextromethorphan-guaiFENesin, hydrALAZINE, ondansetron (ZOFRAN) IV, zolpidem  Exam: Alert, looks better No jvd Chest clear bilat RRR Abd soft ntnd Ext no edema RFA AVF+bruit  Dialysis: [facility] MWF 4h  2/2.5  65kg   RUA AVF   Hep 2400 -Calcitriol 1.5 mcg po q HD -Mircera 150 mcg  q 2 wks (last on 02/12/17 ) -Venofer 50 mg q wkly hd      Assessment: 1. DKA (ho DM type 1)- per admit, doing better, ok for dc per primary MD 2. Viral Syndrome N/V - resolved 3. Hyperkalemia - resolved 4. ESRD - MWF HD 5. Hypertension/volume  - on norvasc/ losartan at home, up 2-3kg 6. Anemia of CKD  - hgb 11.2 no esa need this week (last given 02/12/17 ) fu hgb trend weekly fe on hd weds. 7. Metabolic bone disease -  Po vit d on hd / no Phos  binder as op (pth last 61 )  Fu phos /ca  8. Dipso - stable for dc from renal standpoint after HD today  Plan - HD today this afternoon , UF to dry wt   Kelly Splinter MD [place] pager [phone]   02/23/2017, 1:11 PM   Recent Labs  Lab 02/22/17 0745 02/22/17 1234 02/22/17 1601  NA 134* 136 136  K 4.9 4.5 4.9  CL 100*  102 104  CO2 20* 19* 17*  GLUCOSE 137* 187* 120*  BUN 66* 65* 64*  CREATININE 9.91* 10.47* 10.09*  CALCIUM 8.4* 8.2* 8.1*   Recent Labs  Lab 02/21/17 1416  AST 30  ALT 29  ALKPHOS 99  BILITOT 1.0  PROT 7.1  ALBUMIN 3.4*   Recent Labs  Lab 02/21/17 1416 02/22/17 0232  WBC 16.5*  --   NEUTROABS 13.6*  --   HGB 12.3 11.2*  HCT 35.3* 33.0*  MCV 88.5  --   PLT 269  --    Iron/TIBC/Ferritin/ %Sat    Component Value Date/Time   IRON 76 10/01/2016 1352   TIBC 267 10/01/2016 1352   FERRITIN 186 10/01/2016 1352   IRONPCTSAT 28 10/01/2016 1352   '

## 2017-02-23 NOTE — Plan of Care (Signed)
Pt has had no complaints of pain during the shift.   Pt has no issues with skin integrity or breakdown during the shift. Pt is able to reposition herself with no problem.

## 2017-02-24 DIAGNOSIS — Z992 Dependence on renal dialysis: Secondary | ICD-10-CM

## 2017-02-24 DIAGNOSIS — D638 Anemia in other chronic diseases classified elsewhere: Secondary | ICD-10-CM

## 2017-02-24 DIAGNOSIS — N186 End stage renal disease: Secondary | ICD-10-CM

## 2017-02-24 DIAGNOSIS — R6889 Other general symptoms and signs: Secondary | ICD-10-CM

## 2017-02-24 DIAGNOSIS — E875 Hyperkalemia: Secondary | ICD-10-CM

## 2017-02-24 LAB — CULTURE, GROUP A STREP (THRC)

## 2017-02-24 LAB — CBC WITH DIFFERENTIAL/PLATELET
BASOS ABS: 0 10*3/uL (ref 0.0–0.1)
Basophils Relative: 0 %
EOS PCT: 1 %
Eosinophils Absolute: 0.1 10*3/uL (ref 0.0–0.7)
HEMATOCRIT: 34.6 % — AB (ref 36.0–46.0)
Hemoglobin: 11.8 g/dL — ABNORMAL LOW (ref 12.0–15.0)
LYMPHS PCT: 20 %
Lymphs Abs: 1.5 10*3/uL (ref 0.7–4.0)
MCH: 30.3 pg (ref 26.0–34.0)
MCHC: 34.1 g/dL (ref 30.0–36.0)
MCV: 88.9 fL (ref 78.0–100.0)
MONOS PCT: 7 %
Monocytes Absolute: 0.5 10*3/uL (ref 0.1–1.0)
NEUTROS ABS: 5.2 10*3/uL (ref 1.7–7.7)
Neutrophils Relative %: 72 %
PLATELETS: 219 10*3/uL (ref 150–400)
RBC: 3.89 MIL/uL (ref 3.87–5.11)
RDW: 15.8 % — AB (ref 11.5–15.5)
WBC: 7.3 10*3/uL (ref 4.0–10.5)

## 2017-02-24 LAB — GLUCOSE, CAPILLARY
GLUCOSE-CAPILLARY: 107 mg/dL — AB (ref 65–99)
Glucose-Capillary: 57 mg/dL — ABNORMAL LOW (ref 65–99)

## 2017-02-24 MED ORDER — SODIUM CHLORIDE 0.9 % IV BOLUS (SEPSIS)
500.0000 mL | Freq: Once | INTRAVENOUS | Status: AC
  Administered 2017-02-24: 500 mL via INTRAVENOUS

## 2017-02-24 MED ORDER — DM-GUAIFENESIN ER 30-600 MG PO TB12: 1.0000 | ORAL_TABLET | Freq: Two times a day (BID) | ORAL | 0 refills | Status: DC | PRN

## 2017-02-24 MED ORDER — RENA-VITE PO TABS: 1.0000 | ORAL_TABLET | Freq: Every day | ORAL | Status: DC

## 2017-02-24 NOTE — Care Management Note (Addendum)
Case Management Note  Patient Details  Name: Christine Reed MRN: [id number] Date of Birth: [birth year]  Subjective/Objective:  From home, presents with DKA, has hx of ESRD, HD patient.  Patient was on insulin drip, glucose improved and anion gap closed.  Now off insulin drip.  May be for possible dc today. She has a PCP and medication coverage thru Medicaid.  She will be dc today. No needs.                 Action/Plan: DC home today, no needs.  Expected Discharge Date:                  Expected Discharge Plan:  Home/Self Care  In-House Referral:     Discharge planning Services  CM Consult  Post Acute Care Choice:    Choice offered to:     DME Arranged:    DME Agency:     HH Arranged:    HH Agency:     Status of Service:  In process, will continue to follow  If discussed at Long Length of Stay Meetings, dates discussed:    Additional Comments:  Zenon Mayo, RN 02/24/2017, 9:25 AM

## 2017-02-24 NOTE — Discharge Summary (Signed)
Physician Discharge Summary  Christine Reed  IWL:[id number]  DOB: 10-24-1985  DOA: 02/21/2017 PCP: Merrilee Seashore, MD  Admit date: 02/21/2017 Discharge date: 02/24/2017  Admitted From: Home  Disposition:  Home   Recommendations for Outpatient Follow-up:  1. Follow up with PCP in 1-2 weeks 2. Please obtain BMP/CBC in one week to monitor Hgb and Cr  3. Continue hemodialysis as schedule  4. Follow up with nephrology   Discharge Condition: Stable  CODE STATUS: Full Code  Diet recommendation: Heart Healthy / Carb Modified    Brief/Interim Summary: For full details see H&P/progress note but in brief, Christine Reed is a 32 year old female with medical history of end-stage renal disease, diabetes mellitus type 1, depression and hypertension who presented to the emergency department complaining of flulike symptoms.  Upon ED evaluation patient was found to have hyperglycemia with anion gap of 17 elevated WBC to 16.5 with elevated potassium to 6.1 and creatinine of 10.7.  Patient had mild fever and was tachycardic, flu PCR was negative and patient was admitted for working diagnosis of DKA with possible sepsis.  Patient was treated with insulin drip subsequently transitioned to subcutaneous insulin, antibiotic was not given due to suspected cause of sepsis was viral syndrome.  Patient was dialyzed during hospital stay and clinically improved.  Patient deemed to be stable for discharge and follow-up as an outpatient.  Subjective: Patient seen and examined, she denies any chest pain shortness of breath and palpitation.  Today complaining of headaches.  No acute events overnight.  She had a short course of dialysis yesterday.  Low-grade fever yesterday afternoon.  Discharge Diagnoses/Hospital Course:  DKA/type 1 diabetes mellitus - in setting of dehydration from viral syndrome Treated with insulin drip, transitioned to subcutaneous insulin CBGs stable Resume insulin home dose. Follow-up with  PCP  Sepsis due to viral syndrome - sepsis physiology has resolved Supportive treatment No need for antibiotics at this time  End-stage renal disease on hemodialysis She was dialyzed during hospital stay, will continue her current dialysis schedule per nephrology  Anemia of chronic disease due to end-stage renal disease Hemoglobin stable Follow-up as an outpatient  Hyperkalemia Initially treated with calcium gluconate 2 g and insulin Dialysis Resolved  All other chronic medical condition were stable during the hospitalization.  On the day of the discharge the patient's vitals were stable, and no other acute medical condition were reported by patient. the patient was felt safe to be discharge to home   Discharge Instructions  You were cared for by a hospitalist during your hospital stay. If you have any questions about your discharge medications or the care you received while you were in the hospital after you are discharged, you can call the unit and asked to speak with the hospitalist on call if the hospitalist that took care of you is not available. Once you are discharged, your primary care physician will handle any further medical issues. Please note that NO REFILLS for any discharge medications will be authorized once you are discharged, as it is imperative that you return to your primary care physician (or establish a relationship with a primary care physician if you do not have one) for your aftercare needs so that they can reassess your need for medications and monitor your lab values.  Discharge Instructions    Call MD for:  difficulty breathing, headache or visual disturbances   Complete by:  As directed    Call MD for:  extreme fatigue   Complete by:  As directed    Call MD for:  hives   Complete by:  As directed    Call MD for:  persistant dizziness or light-headedness   Complete by:  As directed    Call MD for:  persistant nausea and vomiting   Complete by:  As  directed    Call MD for:  redness, tenderness, or signs of infection (pain, swelling, redness, odor or green/yellow discharge around incision site)   Complete by:  As directed    Call MD for:  severe uncontrolled pain   Complete by:  As directed    Call MD for:  temperature >100.4   Complete by:  As directed    Diet - low sodium heart healthy   Complete by:  As directed    Increase activity slowly   Complete by:  As directed      Allergies as of 02/24/2017      Reactions   Ivp Dye [iodinated Diagnostic Agents] Other (See Comments)   Burning   Iodine Rash   Nickel Rash      Medication List    STOP taking these medications   Insulin Glargine 300 UNIT/ML Sopn Commonly known as:  TOUJEO SOLOSTAR     TAKE these medications   ACCU-CHEK AVIVA PLUS w/Device Kit Use to check blood sugars 5 times daily What changed:    how much to take  how to take this  when to take this  additional instructions   ACCU-CHEK FASTCLIX LANCETS Misc As directed   amLODipine 10 MG tablet Commonly known as:  NORVASC TAKE ONE TABLET BY MOUTH ONCE DAILY   buPROPion 150 MG 24 hr tablet Commonly known as:  WELLBUTRIN XL Take 150 mg daily by mouth.   dextromethorphan-guaiFENesin 30-600 MG 12hr tablet Commonly known as:  MUCINEX DM Take 1 tablet by mouth 2 (two) times daily as needed for cough.   glucose blood test strip Commonly known as:  ACCU-CHEK AVIVA PLUS Use to check blood sugars four times daily   insulin aspart 100 UNIT/ML injection Commonly known as:  NOVOLOG Inject 0-20 Units 3 (three) times daily before meals into the skin. Sliding scale   Insulin Detemir 100 UNIT/ML Pen Commonly known as:  LEVEMIR Inject 10 units in the morning and inject 11 units in the evening. What changed:    how much to take  how to take this  when to take this  additional instructions   losartan 25 MG tablet Commonly known as:  COZAAR Take 25 mg by mouth at bedtime.      Follow-up  Information    Merrilee Seashore, MD. Schedule an appointment as soon as possible for a visit in 1 week(s).   Specialty:  Internal Medicine Why:  Hospital follow up Contact information: [place] [address] [phone]          Allergies  Allergen Reactions  . Ivp Dye [Iodinated Diagnostic Agents] Other (See Comments)    Burning   . Iodine Rash  . Nickel Rash    Consultations:  Nephrology    Procedures/Studies: Dg Chest 2 View  Result Date: 02/21/2017 CLINICAL DATA:  Cough and fever.  Renal failure. EXAM: CHEST  2 VIEW COMPARISON:  March 01, 2016 FINDINGS: Central catheter tip is at the cavoatrial junction. No pneumothorax. Lungs are clear. Heart size and pulmonary vascularity are normal. No adenopathy. No bone lesions. IMPRESSION: Central catheter tip at cavoatrial junction. No pneumothorax. Lungs clear. Electronically Signed  By: Lowella Grip III M.D.   On: 02/21/2017 14:46    Discharge Exam: Vitals:   02/24/17 0400 02/24/17 0731  BP: 130/75 133/79  Pulse: (!) 103 (!) 104  Resp: 19 16  Temp:  99.7 F (37.6 C)  SpO2: 96% 96%   Vitals:   02/24/17 0100 02/24/17 0353 02/24/17 0400 02/24/17 0731  BP: 140/83  130/75 133/79  Pulse: 94  (!) 103 (!) 104  Resp: (!) '21  19 16  ' Temp:  99 F (37.2 C)  99.7 F (37.6 C)  TempSrc:  Oral  Oral  SpO2: 95%  96% 96%  Weight:      Height:        General: Pt is alert, awake, not in acute distress Cardiovascular: RRR, S1/S2 +, no rubs, no gallops Respiratory: CTA bilaterally, no wheezing, no rhonchi Abdominal: Soft, NT, ND, bowel sounds + Extremities: no edema, no cyanosis  The results of significant diagnostics from this hospitalization (including imaging, microbiology, ancillary and laboratory) are listed below for reference.     Microbiology: Recent Results (from the past 240 hour(s))  Respiratory Panel by PCR     Status: None   Collection Time: 02/21/17  7:34 AM  Result  Value Ref Range Status   Adenovirus NOT DETECTED NOT DETECTED Final   Coronavirus 229E NOT DETECTED NOT DETECTED Final   Coronavirus HKU1 NOT DETECTED NOT DETECTED Final   Coronavirus NL63 NOT DETECTED NOT DETECTED Final   Coronavirus OC43 NOT DETECTED NOT DETECTED Final   Metapneumovirus NOT DETECTED NOT DETECTED Final   Rhinovirus / Enterovirus NOT DETECTED NOT DETECTED Final   Influenza A NOT DETECTED NOT DETECTED Final   Influenza B NOT DETECTED NOT DETECTED Final   Parainfluenza Virus 1 NOT DETECTED NOT DETECTED Final   Parainfluenza Virus 2 NOT DETECTED NOT DETECTED Final   Parainfluenza Virus 3 NOT DETECTED NOT DETECTED Final   Parainfluenza Virus 4 NOT DETECTED NOT DETECTED Final   Respiratory Syncytial Virus NOT DETECTED NOT DETECTED Final   Bordetella pertussis NOT DETECTED NOT DETECTED Final   Chlamydophila pneumoniae NOT DETECTED NOT DETECTED Final   Mycoplasma pneumoniae NOT DETECTED NOT DETECTED Final  Culture, blood (x 2)     Status: None (Preliminary result)   Collection Time: 02/22/17  4:15 AM  Result Value Ref Range Status   Specimen Description BLOOD RIGHT HAND  Final   Special Requests   Final    BOTTLES DRAWN AEROBIC AND ANAEROBIC Blood Culture adequate volume   Culture NO GROWTH 1 DAY  Final   Report Status PENDING  Incomplete  Culture, blood (x 2)     Status: None (Preliminary result)   Collection Time: 02/22/17  5:21 AM  Result Value Ref Range Status   Specimen Description BLOOD LEFT FOREARM  Final   Special Requests IN PEDIATRIC BOTTLE Blood Culture adequate volume  Final   Culture NO GROWTH 1 DAY  Final   Report Status PENDING  Incomplete  Rapid Strep Screen (Not at [facility])     Status: None   Collection Time: 02/22/17  6:10 AM  Result Value Ref Range Status   Streptococcus, Group A Screen (Direct) NEGATIVE NEGATIVE Final    Comment: (NOTE) A Rapid Antigen test may result negative if the antigen level in the sample is below the detection level of this  test. The FDA has not cleared this test as a stand-alone test therefore the rapid antigen negative result has reflexed to a Group A Strep culture.  Culture, group A strep     Status: None (Preliminary result)   Collection Time: 02/22/17  6:10 AM  Result Value Ref Range Status   Specimen Description THROAT  Final   Special Requests NONE Reflexed from [phone]  Final   Culture CULTURE REINCUBATED FOR BETTER GROWTH  Final   Report Status PENDING  Incomplete  Urine Culture     Status: Abnormal   Collection Time: 02/22/17  7:56 AM  Result Value Ref Range Status   Specimen Description URINE, CLEAN CATCH  Final   Special Requests NONE  Final   Culture MULTIPLE SPECIES PRESENT, SUGGEST RECOLLECTION (A)  Final   Report Status 02/23/2017 FINAL  Final  MRSA PCR Screening     Status: None   Collection Time: 02/22/17  6:38 PM  Result Value Ref Range Status   MRSA by PCR NEGATIVE NEGATIVE Final    Comment:        The GeneXpert MRSA Assay (FDA approved for NASAL specimens only), is one component of a comprehensive MRSA colonization surveillance program. It is not intended to diagnose MRSA infection nor to guide or monitor treatment for MRSA infections.      Labs: BNP (last 3 results) No results for input(s): BNP in the last 8760 hours. Basic Metabolic Panel: Recent Labs  Lab 02/21/17 1416 02/22/17 0232 02/22/17 0354 02/22/17 0745 02/22/17 1234 02/22/17 1601  NA 133* 130* 129* 134* 136 136  K 4.9 6.1* 4.4 4.9 4.5 4.9  CL 95* 97* 97* 100* 102 104  CO2 21*  --  16* 20* 19* 17*  GLUCOSE 366* 515* 394* 137* 187* 120*  BUN 53* 55* 67* 66* 65* 64*  CREATININE 8.96* 10.70* 10.27* 9.91* 10.47* 10.09*  CALCIUM 8.4*  --  7.9* 8.4* 8.2* 8.1*   Liver Function Tests: Recent Labs  Lab 02/21/17 1416  AST 30  ALT 29  ALKPHOS 99  BILITOT 1.0  PROT 7.1  ALBUMIN 3.4*   No results for input(s): LIPASE, AMYLASE in the last 168 hours. No results for input(s): AMMONIA in the last 168  hours. CBC: Recent Labs  Lab 02/21/17 1416 02/22/17 0232 02/24/17 0832  WBC 16.5*  --  7.3  NEUTROABS 13.6*  --  5.2  HGB 12.3 11.2* 11.8*  HCT 35.3* 33.0* 34.6*  MCV 88.5  --  88.9  PLT 269  --  219   Cardiac Enzymes: No results for input(s): CKTOTAL, CKMB, CKMBINDEX, TROPONINI in the last 168 hours. BNP: Invalid input(s): POCBNP CBG: Recent Labs  Lab 02/23/17 0618 02/23/17 1154 02/23/17 1544 02/23/17 2053 02/24/17 0730  GLUCAP 138* 80 236* 422* 107*   D-Dimer No results for input(s): DDIMER in the last 72 hours. Hgb A1c No results for input(s): HGBA1C in the last 72 hours. Lipid Profile No results for input(s): CHOL, HDL, LDLCALC, TRIG, CHOLHDL, LDLDIRECT in the last 72 hours. Thyroid function studies No results for input(s): TSH, T4TOTAL, T3FREE, THYROIDAB in the last 72 hours.  Invalid input(s): FREET3 Anemia work up No results for input(s): VITAMINB12, FOLATE, FERRITIN, TIBC, IRON, RETICCTPCT in the last 72 hours. Urinalysis    Component Value Date/Time   COLORURINE YELLOW 02/22/2017 0756   APPEARANCEUR HAZY (A) 02/22/2017 0756   LABSPEC 1.016 02/22/2017 0756   PHURINE 5.0 02/22/2017 0756   GLUCOSEU >=500 (A) 02/22/2017 0756   HGBUR NEGATIVE 02/22/2017 0756   BILIRUBINUR NEGATIVE 02/22/2017 0756   KETONESUR 5 (A) 02/22/2017 0756   PROTEINUR >=300 (A) 02/22/2017 0756   UROBILINOGEN 0.2 03/01/2016  [place] 02/22/2017 0756   LEUKOCYTESUR NEGATIVE 02/22/2017 0756   Sepsis Labs Invalid input(s): PROCALCITONIN,  WBC,  LACTICIDVEN Microbiology Recent Results (from the past 240 hour(s))  Respiratory Panel by PCR     Status: None   Collection Time: 02/21/17  7:34 AM  Result Value Ref Range Status   Adenovirus NOT DETECTED NOT DETECTED Final   Coronavirus 229E NOT DETECTED NOT DETECTED Final   Coronavirus HKU1 NOT DETECTED NOT DETECTED Final   Coronavirus NL63 NOT DETECTED NOT DETECTED Final   Coronavirus OC43 NOT DETECTED NOT DETECTED  Final   Metapneumovirus NOT DETECTED NOT DETECTED Final   Rhinovirus / Enterovirus NOT DETECTED NOT DETECTED Final   Influenza A NOT DETECTED NOT DETECTED Final   Influenza B NOT DETECTED NOT DETECTED Final   Parainfluenza Virus 1 NOT DETECTED NOT DETECTED Final   Parainfluenza Virus 2 NOT DETECTED NOT DETECTED Final   Parainfluenza Virus 3 NOT DETECTED NOT DETECTED Final   Parainfluenza Virus 4 NOT DETECTED NOT DETECTED Final   Respiratory Syncytial Virus NOT DETECTED NOT DETECTED Final   Bordetella pertussis NOT DETECTED NOT DETECTED Final   Chlamydophila pneumoniae NOT DETECTED NOT DETECTED Final   Mycoplasma pneumoniae NOT DETECTED NOT DETECTED Final  Culture, blood (x 2)     Status: None (Preliminary result)   Collection Time: 02/22/17  4:15 AM  Result Value Ref Range Status   Specimen Description BLOOD RIGHT HAND  Final   Special Requests   Final    BOTTLES DRAWN AEROBIC AND ANAEROBIC Blood Culture adequate volume   Culture NO GROWTH 1 DAY  Final   Report Status PENDING  Incomplete  Culture, blood (x 2)     Status: None (Preliminary result)   Collection Time: 02/22/17  5:21 AM  Result Value Ref Range Status   Specimen Description BLOOD LEFT FOREARM  Final   Special Requests IN PEDIATRIC BOTTLE Blood Culture adequate volume  Final   Culture NO GROWTH 1 DAY  Final   Report Status PENDING  Incomplete  Rapid Strep Screen (Not at [facility])     Status: None   Collection Time: 02/22/17  6:10 AM  Result Value Ref Range Status   Streptococcus, Group A Screen (Direct) NEGATIVE NEGATIVE Final    Comment: (NOTE) A Rapid Antigen test may result negative if the antigen level in the sample is below the detection level of this test. The FDA has not cleared this test as a stand-alone test therefore the rapid antigen negative result has reflexed to a Group A Strep culture.   Culture, group A strep     Status: None (Preliminary result)   Collection Time: 02/22/17  6:10 AM  Result Value Ref  Range Status   Specimen Description THROAT  Final   Special Requests NONE Reflexed from [phone]  Final   Culture CULTURE REINCUBATED FOR BETTER GROWTH  Final   Report Status PENDING  Incomplete  Urine Culture     Status: Abnormal   Collection Time: 02/22/17  7:56 AM  Result Value Ref Range Status   Specimen Description URINE, CLEAN CATCH  Final   Special Requests NONE  Final   Culture MULTIPLE SPECIES PRESENT, SUGGEST RECOLLECTION (A)  Final   Report Status 02/23/2017 FINAL  Final  MRSA PCR Screening     Status: None   Collection Time: 02/22/17  6:38 PM  Result Value Ref Range Status   MRSA by PCR NEGATIVE NEGATIVE Final    Comment:  The GeneXpert MRSA Assay (FDA approved for NASAL specimens only), is one component of a comprehensive MRSA colonization surveillance program. It is not intended to diagnose MRSA infection nor to guide or monitor treatment for MRSA infections.      Time coordinating discharge: 32 minutes  SIGNED:  Chipper Oman, MD  [facility] 02/24/2017, 10:14 AM  Pager please text page via  [email or web address]

## 2017-02-24 NOTE — Progress Notes (Signed)
Subjective: Interval History: has complaints still does not feel good.  Objective: Vital signs in last 24 hours: Temp:  [99 F (37.2 C)-101.2 F (38.4 C)] 99.7 F (37.6 C) (01/14 0731) Pulse Rate:  [94-107] 104 (01/14 0731) Resp:  [0-21] 16 (01/14 0731) BP: (130-164)/(75-92) 133/79 (01/14 0731) SpO2:  [95 %-98 %] 96 % (01/14 0731) Weight:  [66.7 kg (147 lb 0.8 oz)] 66.7 kg (147 lb 0.8 oz) (01/13 1600) Weight change:   Intake/Output from previous day: 01/13 0701 - 01/14 0700 In: 120 [P.O.:120] Out: -  Intake/Output this shift: No intake/output data recorded.  General appearance: alert, cooperative and no distress Resp: clear to auscultation bilaterally Chest wall: IJ cath Cardio: S1, S2 normal and systolic murmur: holosystolic 2/6, blowing at apex GI: soft, non-tender; bowel sounds normal; no masses,  no organomegaly Extremities: extremities normal, atraumatic, no cyanosis or edema  Lab Results: Recent Labs    02/21/17 1416 02/22/17 0232 02/24/17 0832  WBC 16.5*  --  7.3  HGB 12.3 11.2* 11.8*  HCT 35.3* 33.0* 34.6*  PLT 269  --  219   BMET:  Recent Labs    02/22/17 1234 02/22/17 1601  NA 136 136  K 4.5 4.9  CL 102 104  CO2 19* 17*  GLUCOSE 187* 120*  BUN 65* 64*  CREATININE 10.47* 10.09*  CALCIUM 8.2* 8.1*   No results for input(s): PTH in the last 72 hours. Iron Studies: No results for input(s): IRON, TIBC, TRANSFERRIN, FERRITIN in the last 72 hours.  Studies/Results: No results found.  I have reviewed the patient's current medications.  Assessment/Plan: 1 ESRD MWF usually, Had HD last pm 2 HTN controlled 3 anemia stable 4 HPTH vit D 5 DM better control, will need to monitor closely at home P HD Wed, to do HT.  Monitor glu, ok to d/c    LOS: 2 days   Jeneen Rinks Manar Smalling 02/24/2017,9:39 AM

## 2017-02-24 NOTE — Progress Notes (Signed)
Patient is ready for discharge. Patient has all of her belongings. Patient has had all discharge instructions and questions answered. Patient is alert and oriented and her VS are stable. Patient IV has been dc'd and pt. Tolerated well. Telemetry has been called and DC'd Patient will leave the unit via wheelchair and meet her mother at the front entrance. Patient's mother will transport her from the hospital.

## 2017-02-25 LAB — HIV ANTIBODY (ROUTINE TESTING W REFLEX): HIV Screen 4th Generation wRfx: NONREACTIVE

## 2017-02-27 LAB — CULTURE, BLOOD (ROUTINE X 2)
CULTURE: NO GROWTH
Culture: NO GROWTH
Special Requests: ADEQUATE
Special Requests: ADEQUATE

## 2017-04-24 ENCOUNTER — Other Ambulatory Visit: Payer: Self-pay

## 2017-04-24 ENCOUNTER — Telehealth: Payer: Self-pay | Admitting: Endocrinology

## 2017-04-24 MED ORDER — INSULIN DETEMIR 100 UNIT/ML FLEXPEN: PEN_INJECTOR | SUBCUTANEOUS | 0 refills | Status: DC

## 2017-04-24 MED ORDER — INSULIN ASPART 100 UNIT/ML ~~LOC~~ SOLN: 0.0000 [IU] | Freq: Three times a day (TID) | SUBCUTANEOUS | 3 refills | Status: DC

## 2017-04-24 NOTE — Telephone Encounter (Signed)
Done

## 2017-04-24 NOTE — Telephone Encounter (Signed)
Patient needs RX for [place] Pen & Novalog Vial sent to [facility] at [facility]

## 2017-05-08 ENCOUNTER — Telehealth: Payer: Self-pay | Admitting: Endocrinology

## 2017-05-08 ENCOUNTER — Telehealth: Payer: Self-pay | Admitting: Neurology

## 2017-05-08 ENCOUNTER — Encounter: Payer: Self-pay | Admitting: Neurology

## 2017-05-08 ENCOUNTER — Ambulatory Visit (INDEPENDENT_AMBULATORY_CARE_PROVIDER_SITE_OTHER): Payer: Medicare Other | Admitting: Neurology

## 2017-05-08 VITALS — BP 139/93 | Ht 65.0 in | Wt 144.0 lb

## 2017-05-08 DIAGNOSIS — IMO0002 Reserved for concepts with insufficient information to code with codable children: Secondary | ICD-10-CM | POA: Insufficient documentation

## 2017-05-08 DIAGNOSIS — G43709 Chronic migraine without aura, not intractable, without status migrainosus: Secondary | ICD-10-CM

## 2017-05-08 DIAGNOSIS — R519 Headache, unspecified: Secondary | ICD-10-CM

## 2017-05-08 DIAGNOSIS — R51 Headache: Secondary | ICD-10-CM

## 2017-05-08 MED ORDER — ONDANSETRON 4 MG PO TBDP: 4.0000 mg | ORAL_TABLET | Freq: Three times a day (TID) | ORAL | 6 refills | Status: DC | PRN

## 2017-05-08 MED ORDER — RIZATRIPTAN BENZOATE 5 MG PO TBDP: 5.0000 mg | ORAL_TABLET | ORAL | 12 refills | Status: DC | PRN

## 2017-05-08 MED ORDER — PROPRANOLOL HCL ER 120 MG PO CP24: 120.0000 mg | ORAL_CAPSULE | Freq: Every day | ORAL | 11 refills | Status: DC

## 2017-05-08 NOTE — Progress Notes (Signed)
PATIENT: Christine Reed DOB: 05/30/1985  Chief Complaint  Patient presents with  . Headache    She estimates five headaches days per week.  She is also using Tyelnol 325m, 1-3 tablets five days per week. Headaches are worse on days she has dialysis.  She sometimes has nausea, vomiting, blurred vision and light/noise sensitivity.  . Nephrology    Christine Agent MD (referring MD)  . PCP    Christine Seashore MD     HISTORICAL  Christine MINARDIis a 32year old female, seen in refer by her nephrologist Dr. SRexene Reed for evaluation of headaches, his primary care physician is Dr. RMerrilee Reed initial evaluation was on May 08, 2017.  She has past medical history of hypertension, type 1 diabetes, insulin-dependent, end-stage renal disease, on dialysis since September 2018, Monday Wednesday and Fridays, [facility] She reported a history of migraine headaches since young, is usually right retro-orbital area severe pounding headache with associated light noise sensitivity, nauseous, on dialysis, she has migraine headache once or twice each months, but since she started dialysis in September 2018, she has daily migraine headaches, getting worse after each dialysis section.  Before she go on dialysis, she is now taking 3 tablets of Tylenol, 1 Benadryl, dialysis last about 4 hours, at the end of 3 hours, she began to notice gradually building up of pounding headache at bilateral retro-orbital area, sometimes with light noise sensitivity, nauseous, often she has to abort dialysis half to 1 hour early, the headache would go on rest of the day, she will take 2 extra dose of Tylenol.  In the days that she does not have dialysis, her headache is better, but still has mild to moderate retro-orbital area pressure headaches, sometimes pounding.  She usually take Tylenol 2 tablets every morning.  She complains of blurry vision during intense  headaches, no lateralized motor or sensory deficit,  CT head without contrast in 2018 showed no acute abnormality.  Laboratory evaluation in October 2018 showed negative hepatitis B surface antigen, hepatitis B core antibody, hemoglobin of 11.2, creatinine of 7.4 A1c was 9.6,  REVIEW OF SYSTEMS: Full 14 system review of systems performed and notable only for fatigue  ALLERGIES: Allergies  Allergen Reactions  . Ivp Dye [Iodinated Diagnostic Agents] Other (See Comments)    Burning   . Iodine Rash  . Nickel Rash    HOME MEDICATIONS: Current Outpatient Medications  Medication Sig Dispense Refill  . ACCU-CHEK FASTCLIX LANCETS MISC As directed 100 each 0  . amLODipine (NORVASC) 10 MG tablet TAKE ONE TABLET BY MOUTH ONCE DAILY 30 tablet 0  . Blood Glucose Monitoring Suppl (ACCU-CHEK AVIVA PLUS) w/Device KIT Use to check blood sugars 5 times daily (Patient taking differently: 1 each by Other route 4 (four) times daily. Use to check blood sugars 3-4 times daily) 1 kit 1  . buPROPion (WELLBUTRIN XL) 150 MG 24 hr tablet Take 150 mg daily by mouth.    .Marland Kitchenglucose blood (ACCU-CHEK AVIVA PLUS) test strip Use to check blood sugars four times daily 150 each 3  . insulin aspart (NOVOLOG) 100 UNIT/ML injection Inject 0-20 Units into the skin 3 (three) times daily before meals. Sliding scale 10 mL 3  . Insulin Detemir (LEVEMIR) 100 UNIT/ML Pen Inject 10 units in the morning and inject 11 units in the evening. 15 mL 0  . losartan (COZAAR) 25 MG tablet Take 25 mg  by mouth at bedtime.      No current facility-administered medications for this visit.     PAST MEDICAL HISTORY: Past Medical History:  Diagnosis Date  . Anemia   . Chronic kidney disease   . Depression   . Diabetes mellitus    diagnosed at age 32; Type 1  . Diabetic retinopathy ([place])   . GERD (gastroesophageal reflux disease)   . Headache   . Hypertension   . Sickle cell trait ([place])     PAST SURGICAL HISTORY: Past Surgical  History:  Procedure Laterality Date  . AV FISTULA PLACEMENT Right 10/10/2016   Procedure: CREATION OF RIGHT ARM RADIOCEPHALIC ARTERIOVENOUS (AV) FISTULA;  Surgeon: Serafina Mitchell, MD;  Location: [facility];  Service: Vascular;  Laterality: Right;  . CESAREAN SECTION N/A 10/19/2015   Procedure: CESAREAN SECTION;  Surgeon: Chancy Milroy, MD;  Location: [facility];  Service: Obstetrics;  Laterality: N/A;  . lipo suction  2015  . TONSILLECTOMY    . TONSILLECTOMY AND ADENOIDECTOMY      FAMILY HISTORY: Family History  Problem Relation Age of Onset  . Cancer Maternal Grandmother        colon and breast  . Hypertension Maternal Grandmother   . Diabetes Maternal Grandfather   . Hypertension Maternal Grandfather   . Diabetes Mother   . Hypertension Mother   . Diabetes Father   . Hypertension Father   . Sickle cell trait Father   . Diabetes Paternal Grandmother   . Hypertension Paternal Grandmother   . Diabetes Paternal Grandfather   . Hypertension Paternal Grandfather     SOCIAL HISTORY:  Social History   Socioeconomic History  . Marital status: Single    Spouse name: Not on file  . Number of children: 1  . Years of education: some college  . Highest education level: Not on file  Occupational History  . Occupation: Disabled  Social Needs  . Financial resource strain: Not on file  . Food insecurity:    Worry: Not on file    Inability: Not on file  . Transportation needs:    Medical: Not on file    Non-medical: Not on file  Tobacco Use  . Smoking status: Never Smoker  . Smokeless tobacco: Never Used  Substance and Sexual Activity  . Alcohol use: Yes    Comment: very rarely  . Drug use: No  . Sexual activity: Not Currently  Lifestyle  . Physical activity:    Days per week: Not on file    Minutes per session: Not on file  . Stress: Not on file  Relationships  . Social connections:    Talks on phone: Not on file    Gets together: Not on file    Attends  religious service: Not on file    Active member of club or organization: Not on file    Attends meetings of clubs or organizations: Not on file    Relationship status: Not on file  . Intimate partner violence:    Fear of current or ex partner: Not on file    Emotionally abused: Not on file    Physically abused: Not on file    Forced sexual activity: Not on file  Other Topics Concern  . Not on file  Social History Narrative   Lives at home with mother.   Right-handed.   4-5 cups caffeine per week.     PHYSICAL EXAM   Vitals:   05/08/17 0845  Weight: 144  lb (65.3 kg)  Height: '5\' 5"'  (1.651 m)    Not recorded      Body mass index is 23.96 kg/m.  PHYSICAL EXAMNIATION:  Gen: NAD, conversant, well nourised, obese, well groomed                     Cardiovascular: Regular rate rhythm, no peripheral edema, warm, nontender. Eyes: Conjunctivae clear without exudates or hemorrhage Neck: Supple, no carotid bruits. Pulmonary: Clear to auscultation bilaterally   NEUROLOGICAL EXAM:  MENTAL STATUS: Speech:    Speech is normal; fluent and spontaneous with normal comprehension.  Cognition:     Orientation to time, place and person     Normal recent and remote memory     Normal Attention span and concentration     Normal Language, naming, repeating,spontaneous speech     Fund of knowledge   CRANIAL NERVES: CN II: Visual fields are full to confrontation. Fundoscopic exam is normal with sharp discs and no vascular changes. Pupils are round equal and briskly reactive to light. CN III, IV, VI: extraocular movement are normal. No ptosis. CN V: Facial sensation is intact to pinprick in all 3 divisions bilaterally. Corneal responses are intact.  CN VII: Face is symmetric with normal eye closure and smile. CN VIII: Hearing is normal to rubbing fingers CN IX, X: Palate elevates symmetrically. Phonation is normal. CN XI: Head turning and shoulder shrug are intact CN XII: Tongue is  midline with normal movements and no atrophy.  MOTOR: There is no pronator drift of out-stretched arms. Muscle bulk and tone are normal. Muscle strength is normal.  REFLEXES: Reflexes are 2+ and symmetric at the biceps, triceps, knees, and ankles. Plantar responses are flexor.  SENSORY: Intact to light touch, pinprick, positional sensation and vibratory sensation are intact in fingers and toes.  COORDINATION: Rapid alternating movements and fine finger movements are intact. There is no dysmetria on finger-to-nose and heel-knee-shin.    GAIT/STANCE: Posture is normal. Gait is steady with normal steps, base, arm swing, and turning. Heel and toe walking are normal. Tandem gait is normal.  Romberg is absent.   DIAGNOSTIC DATA (LABS, IMAGING, TESTING) - I reviewed patient records, labs, notes, testing and imaging myself where available.   ASSESSMENT AND PLAN  Christine Reed is a 32 y.o. female    Worsening persistent daily headaches  chronic migraine, with medicine rebound headaches component  Worsening headache at the end of dialysis section, suggestive of a vascular component,  Inderal XL 120 mg every day as preventive medications, Maxalt 5 mg as needed for abortive treatment,  MRI of the brain, MRA of the neck  Stop daily Tylenol use  Marcial Pacas, M.D. Ph.D.  [facility] [address] [place], [place] [address] Ph: [phone] Fax: [phone]  HX:[id number], Meredith Leeds, MD,  Merrilee Seashore, MD

## 2017-05-08 NOTE — Telephone Encounter (Signed)
Patient stated the pharmacy have not received Novalog  send to [place], [place] - [address]

## 2017-05-08 NOTE — Telephone Encounter (Signed)
Medicare/medicaid order sent to [facility] they will contact the pt to schedule.

## 2017-05-09 MED ORDER — INSULIN ASPART 100 UNIT/ML ~~LOC~~ SOLN: 0.0000 [IU] | Freq: Three times a day (TID) | SUBCUTANEOUS | 3 refills | Status: DC

## 2017-05-09 NOTE — Telephone Encounter (Signed)
Rf sent. See meds.  

## 2017-05-14 ENCOUNTER — Telehealth: Payer: Self-pay | Admitting: Neurology

## 2017-05-14 MED ORDER — NORTRIPTYLINE HCL 10 MG PO CAPS: 10.0000 mg | ORAL_CAPSULE | Freq: Every day | ORAL | 11 refills | Status: DC

## 2017-05-14 NOTE — Telephone Encounter (Signed)
Left message requesting a return call.

## 2017-05-14 NOTE — Addendum Note (Signed)
Addended by: Desmond Lope on: 05/14/2017 04:37 PM   Modules accepted: Orders

## 2017-05-14 NOTE — Telephone Encounter (Signed)
Per vo by Dr. Krista Blue, discontinue propranolol.  She will provide patient with new prescription for nortriptyline 10mg  at bedtime.  She should not take this new medication until her facial/throat swelling has resolved.  Propranolol has been removed from her medication list and added as an allergy.

## 2017-05-14 NOTE — Telephone Encounter (Signed)
Pt called stating she started taking propranolol ER (INDERAL LA) 120 MG 24 hr capsule on Monday 4/1 but has made her face swell. Please call to advise

## 2017-05-14 NOTE — Telephone Encounter (Signed)
Spoke to patient - she is agreeable to this medication change and verbalized understanding to hold off starting until her swelling has resolved.

## 2017-05-14 NOTE — Addendum Note (Signed)
Addended by: Noberto Retort C on: 05/14/2017 04:46 PM   Modules accepted: Orders

## 2017-05-14 NOTE — Telephone Encounter (Signed)
Spoke to patient - she has been taking propranolol ER for four days.  States she has been experiencing facial and throat swelling since starting the medication.  She denies any difficulty swallowing or breathing.  However, says the symptoms are significant enough that she has been using Benadryl daily.  Says the only other new medication used was one dose of rizatriptan a few days ago.  No other medications have been started by other providers.  She has not used any new OTC medications or vitamins.  No changes to her environment (ex. detergents, facial wash, etc).

## 2017-05-15 ENCOUNTER — Emergency Department (HOSPITAL_COMMUNITY)
Admission: EM | Admit: 2017-05-15 | Discharge: 2017-05-15 | Disposition: A | Discharge status: Home / Self Care | Payer: Medicare Other | Attending: Emergency Medicine | Admitting: Emergency Medicine

## 2017-05-15 ENCOUNTER — Other Ambulatory Visit: Payer: Self-pay

## 2017-05-15 ENCOUNTER — Encounter (HOSPITAL_COMMUNITY): Payer: Self-pay | Admitting: *Deleted

## 2017-05-15 DIAGNOSIS — Z992 Dependence on renal dialysis: Secondary | ICD-10-CM | POA: Diagnosis not present

## 2017-05-15 DIAGNOSIS — R7989 Other specified abnormal findings of blood chemistry: Secondary | ICD-10-CM | POA: Diagnosis not present

## 2017-05-15 DIAGNOSIS — I120 Hypertensive chronic kidney disease with stage 5 chronic kidney disease or end stage renal disease: Secondary | ICD-10-CM | POA: Insufficient documentation

## 2017-05-15 DIAGNOSIS — Z79899 Other long term (current) drug therapy: Secondary | ICD-10-CM | POA: Diagnosis not present

## 2017-05-15 DIAGNOSIS — E10649 Type 1 diabetes mellitus with hypoglycemia without coma: Secondary | ICD-10-CM | POA: Insufficient documentation

## 2017-05-15 DIAGNOSIS — Z794 Long term (current) use of insulin: Secondary | ICD-10-CM | POA: Diagnosis not present

## 2017-05-15 DIAGNOSIS — E162 Hypoglycemia, unspecified: Secondary | ICD-10-CM

## 2017-05-15 DIAGNOSIS — D573 Sickle-cell trait: Secondary | ICD-10-CM | POA: Insufficient documentation

## 2017-05-15 DIAGNOSIS — N186 End stage renal disease: Secondary | ICD-10-CM

## 2017-05-15 DIAGNOSIS — R945 Abnormal results of liver function studies: Secondary | ICD-10-CM

## 2017-05-15 LAB — CBC WITH DIFFERENTIAL/PLATELET
Basophils Absolute: 0 10*3/uL (ref 0.0–0.1)
Basophils Relative: 0 %
Eosinophils Absolute: 0 10*3/uL (ref 0.0–0.7)
Eosinophils Relative: 0 %
HCT: 37.7 % (ref 36.0–46.0)
Hemoglobin: 13.4 g/dL (ref 12.0–15.0)
Lymphocytes Relative: 13 %
Lymphs Abs: 1.5 10*3/uL (ref 0.7–4.0)
MCH: 32.8 pg (ref 26.0–34.0)
MCHC: 35.5 g/dL (ref 30.0–36.0)
MCV: 92.2 fL (ref 78.0–100.0)
Monocytes Absolute: 0.3 10*3/uL (ref 0.1–1.0)
Monocytes Relative: 3 %
Neutro Abs: 9.7 10*3/uL — ABNORMAL HIGH (ref 1.7–7.7)
Neutrophils Relative %: 84 %
Platelets: 294 10*3/uL (ref 150–400)
RBC: 4.09 MIL/uL (ref 3.87–5.11)
RDW: 19.4 % — ABNORMAL HIGH (ref 11.5–15.5)
WBC: 11.6 10*3/uL — ABNORMAL HIGH (ref 4.0–10.5)

## 2017-05-15 LAB — COMPREHENSIVE METABOLIC PANEL
ALT: 160 U/L — ABNORMAL HIGH (ref 14–54)
AST: 164 U/L — ABNORMAL HIGH (ref 15–41)
Albumin: 3.1 g/dL — ABNORMAL LOW (ref 3.5–5.0)
Alkaline Phosphatase: 127 U/L — ABNORMAL HIGH (ref 38–126)
Anion gap: 15 (ref 5–15)
BUN: 58 mg/dL — ABNORMAL HIGH (ref 6–20)
CO2: 23 mmol/L (ref 22–32)
Calcium: 7.8 mg/dL — ABNORMAL LOW (ref 8.9–10.3)
Chloride: 95 mmol/L — ABNORMAL LOW (ref 101–111)
Creatinine, Ser: 5.96 mg/dL — ABNORMAL HIGH (ref 0.44–1.00)
GFR calc Af Amer: 10 mL/min — ABNORMAL LOW (ref >60)
GFR calc non Af Amer: 9 mL/min — ABNORMAL LOW (ref >60)
Glucose, Bld: 357 mg/dL — ABNORMAL HIGH (ref 65–99)
Potassium: 5.6 mmol/L — ABNORMAL HIGH (ref 3.5–5.1)
Sodium: 133 mmol/L — ABNORMAL LOW (ref 135–145)
Total Bilirubin: 1 mg/dL (ref 0.3–1.2)
Total Protein: 6.7 g/dL (ref 6.5–8.1)

## 2017-05-15 LAB — CBG MONITORING, ED
Glucose-Capillary: 299 mg/dL — ABNORMAL HIGH (ref 65–99)
Glucose-Capillary: 121 mg/dL — ABNORMAL HIGH (ref 65–99)

## 2017-05-15 LAB — ACETAMINOPHEN LEVEL

## 2017-05-15 MED ORDER — SODIUM BICARBONATE 8.4 % IV SOLN
50.0000 meq | Freq: Once | INTRAVENOUS | Status: DC
Start: 2017-05-15 — End: 2017-05-15
  Filled 2017-05-15: qty 50

## 2017-05-15 MED ORDER — SODIUM POLYSTYRENE SULFONATE 15 GM/60ML PO SUSP
30.0000 g | Freq: Once | ORAL | Status: DC
Start: 2017-05-15 — End: 2017-05-15
  Filled 2017-05-15: qty 120

## 2017-05-15 MED ORDER — SODIUM CHLORIDE 0.9 % IV SOLN
1.0000 g | Freq: Once | INTRAVENOUS | Status: DC
  Filled 2017-05-15: qty 10

## 2017-05-15 NOTE — ED Notes (Signed)
Pt stable, ambulatory, states understanding of discharge instructions 

## 2017-05-15 NOTE — ED Notes (Signed)
Pt given water and a sandwich.   CBG 121

## 2017-05-15 NOTE — Discharge Instructions (Signed)
Please follow up with your primary doctor to have your LFTs rechecked Avoid any Tylenol or alcohol Please take your potassium binders regularly and go to dialysis tomorrow Return if worsening

## 2017-05-15 NOTE — ED Triage Notes (Signed)
Pt in stating that this morning her glucose level at home was 30, last night she was reading high, states she went to dialysis yesterday and they told her she had too much fluid and was needing to come back today to have more taken off, states she just feels bad, no distress noted

## 2017-05-15 NOTE — ED Provider Notes (Signed)
[facility] EMERGENCY DEPARTMENT Provider Note   CSN: [id number] Arrival date & time: 05/15/17  0831     History   Chief Complaint Chief Complaint  Patient presents with  . Hypoglycemia    HPI Christine Reed is a 32 y.o. female who presents with hypoglycemia and generalized malaise. PMH significant for IDDM, ESRD on dialysis M/W/F. She last dialyzed yesterday. She states that over the past week she has had generalized malaise. She also has had more diarrhea than normal. Her mother is at bedside and states that they are unsure if it's a medication reaction because she was started on a new medicine (Propranolol) for headaches but was taken off due to a possible allergic reaction. She had facial swelling. Yesterday she went to dialysis and finished her session. She was told to come back today for another session but this morning was hypoglycemic to 37 and felt too weak to go. Last night her glucose was in the 500s and she took extra insulin. Her glucose improved after PO intake however her malaise did not so she came to the ED. She states she does take quite a bit of Tylenol because of her chronic migraines but hasn't taken any for a couple of days after being started on Propranolol and Maaxalt. She denies fever, chills, chest pain, SOB, abdominal pain, N/V.   PCP: Dr. Ashby Dawes Nephrologist:  Endocrinologist: Dr. Dwyane Dee Neurologist: Dr. Krista Blue  HPI  Past Medical History:  Diagnosis Date  . Anemia   . Chronic kidney disease   . Depression   . Diabetes mellitus    diagnosed at age 51; Type 1  . Diabetic retinopathy ([place])   . GERD (gastroesophageal reflux disease)   . Headache   . Hypertension   . Sickle cell trait [facility])     Patient Active Problem List   Diagnosis Date Noted  . Chronic migraine 05/08/2017  . Acute intractable headache 05/08/2017  . Sepsis ([place]) 02/22/2017  . ESRD on hemodialysis ([place]) 02/22/2017  . Uremia 02/22/2017  . DKA (diabetic  ketoacidoses) ([place]) 02/22/2017  . Hyperkalemia   . DKA, type 1 ([place]) 03/01/2016  . Nausea & vomiting 03/01/2016  . Flu-like symptoms 03/01/2016  . Nephrotic syndrome 10/17/2015  . Ascites 10/17/2015  . Symptomatic anemia 09/11/2015  . Anemia affecting pregnancy in second trimester, antepartum 08/12/2015  . Diabetic nephropathy ([place]) 08/07/2015  . CKD (chronic kidney disease) stage 3, GFR 30-59 ml/min ([facility]) 08/07/2015  . Diabetic retinopathy ([place]) 07/24/2015  . Chronic hypertension during pregnancy, antepartum 07/22/2015  . Anemia, chronic disease 07/17/2015  . T1DM - Type F 06/16/2015  . Supervision of high risk pregnancy, antepartum 06/16/2015  . Type 1 diabetes mellitus with ketoacidosis without coma ([place]) 02/19/2012    Past Surgical History:  Procedure Laterality Date  . AV FISTULA PLACEMENT Right 10/10/2016   Procedure: CREATION OF RIGHT ARM RADIOCEPHALIC ARTERIOVENOUS (AV) FISTULA;  Surgeon: Serafina Mitchell, MD;  Location: [facility];  Service: Vascular;  Laterality: Right;  . CESAREAN SECTION N/A 10/19/2015   Procedure: CESAREAN SECTION;  Surgeon: Chancy Milroy, MD;  Location: [facility];  Service: Obstetrics;  Laterality: N/A;  . lipo suction  2015  . TONSILLECTOMY    . TONSILLECTOMY AND ADENOIDECTOMY       OB History    Gravida  1   Para  1   Term      Preterm  1   AB      Living  SAB      TAB      Ectopic      Multiple  0   Live Births               Home Medications    Prior to Admission medications   Medication Sig Start Date End Date Taking? Authorizing Provider  ACCU-CHEK FASTCLIX LANCETS MISC As directed 12/09/16   Elayne Snare, MD  amLODipine (NORVASC) 10 MG tablet TAKE ONE TABLET BY MOUTH ONCE DAILY 04/16/16   Gwen Pounds, CNM  Blood Glucose Monitoring Suppl (ACCU-CHEK AVIVA PLUS) w/Device KIT Use to check blood sugars 5 times daily Patient taking differently: 1 each by Other route 4 (four) times daily. Use to check blood  sugars 3-4 times daily 09/09/16   Elayne Snare, MD  buPROPion (WELLBUTRIN XL) 150 MG 24 hr tablet Take 150 mg daily by mouth.    [provider]  glucose blood (ACCU-CHEK AVIVA PLUS) test strip Use to check blood sugars four times daily 11/29/16   Elayne Snare, MD  insulin aspart (NOVOLOG) 100 UNIT/ML injection Inject 0-20 Units into the skin 3 (three) times daily before meals. Sliding scale 05/09/17   Elayne Snare, MD  Insulin Detemir (LEVEMIR) 100 UNIT/ML Pen Inject 10 units in the morning and inject 11 units in the evening. 04/24/17   Elayne Snare, MD  losartan (COZAAR) 25 MG tablet Take 25 mg by mouth at bedtime.     [provider]  nortriptyline (PAMELOR) 10 MG capsule Take 1 capsule (10 mg total) by mouth at bedtime. 05/14/17   Marcial Pacas, MD  ondansetron (ZOFRAN ODT) 4 MG disintegrating tablet Take 1 tablet (4 mg total) by mouth every 8 (eight) hours as needed. 05/08/17   Marcial Pacas, MD  rizatriptan (MAXALT-MLT) 5 MG disintegrating tablet Take 1 tablet (5 mg total) by mouth as needed. May repeat in 2 hours if needed 05/08/17   Marcial Pacas, MD    Family History Family History  Problem Relation Age of Onset  . Cancer Maternal Grandmother        colon and breast  . Hypertension Maternal Grandmother   . Diabetes Maternal Grandfather   . Hypertension Maternal Grandfather   . Diabetes Mother   . Hypertension Mother   . Diabetes Father   . Hypertension Father   . Sickle cell trait Father   . Diabetes Paternal Grandmother   . Hypertension Paternal Grandmother   . Diabetes Paternal Grandfather   . Hypertension Paternal Grandfather     Social History Social History   Tobacco Use  . Smoking status: Never Smoker  . Smokeless tobacco: Never Used  Substance Use Topics  . Alcohol use: Yes    Comment: very rarely  . Drug use: No     Allergies   Ivp dye [iodinated diagnostic agents]; Propranolol hcl er; Iodine; and Nickel   Review of Systems Review of Systems    Constitutional: Negative for chills and fever.       +malaise  Respiratory: Negative for shortness of breath.   Cardiovascular: Negative for chest pain.  Gastrointestinal: Positive for diarrhea. Negative for abdominal pain, nausea and vomiting.  Genitourinary: Negative for dysuria.  Neurological: Positive for headaches (chronic).  All other systems reviewed and are negative.    Physical Exam Updated Vital Signs BP (!) 152/93 (BP Location: Left Arm)   Pulse 93   Temp 97.8 F (36.6 C) (Oral)   Resp 16   SpO2 100%   Physical Exam  Constitutional: She is oriented to person, place, and time. She appears well-developed and well-nourished. No distress.  HENT:  Head: Normocephalic and atraumatic.  Eyes: Pupils are equal, round, and reactive to light. Conjunctivae are normal. Right eye exhibits no discharge. Left eye exhibits no discharge. No scleral icterus.  Neck: Normal range of motion.  Cardiovascular: Normal rate and regular rhythm. Exam reveals no gallop and no friction rub.  Murmur heard. Pulmonary/Chest: Effort normal and breath sounds normal. No respiratory distress.  Abdominal: Soft. Bowel sounds are normal. She exhibits no distension. There is no tenderness.  Musculoskeletal:  Palpable thrill over right AVF  Neurological: She is alert and oriented to person, place, and time.  Skin: Skin is warm and dry.  Psychiatric: She has a normal mood and affect. Her behavior is normal.  Nursing note and vitals reviewed.    ED Treatments / Results  Labs (all labs ordered are listed, but only abnormal results are displayed) Labs Reviewed  CBC WITH DIFFERENTIAL/PLATELET - Abnormal; Notable for the following components:      Result Value   WBC 11.6 (*)    RDW 19.4 (*)    Neutro Abs 9.7 (*)    All other components within normal limits  COMPREHENSIVE METABOLIC PANEL - Abnormal; Notable for the following components:   Sodium 133 (*)    Potassium 5.6 (*)    Chloride 95 (*)     Glucose, Bld 357 (*)    BUN 58 (*)    Creatinine, Ser 5.96 (*)    Calcium 7.8 (*)    Albumin 3.1 (*)    AST 164 (*)    ALT 160 (*)    Alkaline Phosphatase 127 (*)    GFR calc non Af Amer 9 (*)    GFR calc Af Amer 10 (*)    All other components within normal limits  ACETAMINOPHEN LEVEL - Abnormal; Notable for the following components:   Acetaminophen (Tylenol), Serum <10 (*)    All other components within normal limits  CBG MONITORING, ED - Abnormal; Notable for the following components:   Glucose-Capillary 299 (*)    All other components within normal limits  CBG MONITORING, ED - Abnormal; Notable for the following components:   Glucose-Capillary 121 (*)    All other components within normal limits  HEPATITIS PANEL, ACUTE    EKG None  Radiology No results found.  Procedures Procedures (including critical care time)  Medications Ordered in ED Medications - No data to display   Initial Impression / Assessment and Plan / ED Course  I have reviewed the triage vital signs and the nursing notes.  Pertinent labs & imaging results that were available during my care of the patient were reviewed by me and considered in my medical decision making (see chart for details).  32 year old female presents with generalized malaise and episode of hypoglycemia. She is hypertensive but otherwise vitals are normal. Exam is overall unremarkable. Her abdomen is non-tender. CBC is remarkable for mild leukocytosis of 11.6. CMP has multiple derangements. Notably her LFTs are elevated from baseline. She does admit to taking a lot of Tylenol for her headaches but hasn't been taking it recently. Tylenol level is undetectable. Hepatitis panel was sent. Discussed with Nephrology PA who advised the patient should follow up for her regular dialysis session tomorrow. Her potassium is 5.7 today however she is admittedly not taking her potassium binders as prescribed. She refused to take Kayexalate, bicarb, Ca  gluconate. She prefers to take  her medicine at home and follow up tomorrow. She was also advised to follow up with her PCP for recheck of her LFTs. Return precautions were given.  Final Clinical Impressions(s) / ED Diagnoses   Final diagnoses:  Elevated LFTs  Hypoglycemia  ESRD (end stage renal disease) [facility])    ED Discharge Orders    None       Recardo Evangelist, PA-C 05/15/17 Doran Heater    Virgel Manifold, MD 05/16/17 [phone]

## 2017-05-15 NOTE — ED Notes (Signed)
Pt refusing medications at this time.  Requesting to speak to MD/PA.  Claiborne Billings PA notified.

## 2017-05-16 LAB — HEPATITIS PANEL, ACUTE
HCV Ab: <0.1 s/co ratio (ref 0.0–0.9)
Hep A IgM: NEGATIVE
Hep B C IgM: NEGATIVE
Hepatitis B Surface Ag: NEGATIVE

## 2017-05-19 ENCOUNTER — Encounter: Payer: Self-pay | Admitting: *Deleted

## 2017-05-20 ENCOUNTER — Ambulatory Visit: Admit: 2017-05-20 | Discharge: 2017-05-21 | Payer: MEDICARE | Attending: Clinical | Primary: Clinical

## 2017-05-20 DIAGNOSIS — F331 Major depressive disorder, recurrent, moderate: Principal | ICD-10-CM

## 2017-05-29 ENCOUNTER — Other Ambulatory Visit (INDEPENDENT_AMBULATORY_CARE_PROVIDER_SITE_OTHER): Payer: Self-pay

## 2017-05-29 ENCOUNTER — Other Ambulatory Visit: Payer: Self-pay

## 2017-05-29 DIAGNOSIS — E1065 Type 1 diabetes mellitus with hyperglycemia: Secondary | ICD-10-CM

## 2017-05-29 LAB — GLUCOSE, RANDOM: GLUCOSE: 135 mg/dL — AB (ref 70–99)

## 2017-05-30 LAB — FRUCTOSAMINE: FRUCTOSAMINE: 471 umol/L — AB (ref 0–285)

## 2017-06-02 NOTE — Progress Notes (Signed)
Patient ID: Christine Reed, female   DOB: [birth year], 32 y.o.   MRN: [id number]           Reason for Appointment : Follow-up for Type 1 Diabetes  History of Present Illness          Diagnosis: Type 1 diabetes mellitus, date of diagnosis: 2000         Previous history:   She was initially started on NPH and Regular Insulin and subsequently was on Lantus and Humalog Usually has had poor control but records are not available for the last few years from her previous endocrinologist In April her Lantus was changed Novolin N presumably because of tendency to low sugars during the night  Recent history:   INSULIN regimen is: Novolog at meals carbohydrate coverage 1:10, correction factor 1 units per 50 mg over 150 Levemir 10-morning and 10 units in evening at bedtime  She has not been seen in follow-up since 11/2016  A1c not done since she is only doing the labs that were supposed to be done for her 01/2017 visit  Eaing at 11 am and ? pm  Current management, blood sugar patterns and problems identified:    She has checked her blood sugars again with the [employer] monitor despite informing her that she needs to use the Accu-Chek meter  She did not have the right testis for the Accu-Chek but did not request a prescription again  She is checking her blood sugars somewhat irregularly and mostly in the morning with another reading randomly in the evening  Although her blood sugars are looking reasonably good in the middle of April they have been mostly high in the last week for no particular reason except for 1 day  She is now on DIALYSIS  She claims that when she goes for dialysis she will get hypoglycemic relatively frequently within a couple of hours; she starts dialysis at about noon  For this reason she recently has been trying to keep her morning sugars high and not take any NovoLog since she thinks NovoLog will drop her sugar low in the afternoon  She is approximately counting her  carbohydrates and taking 1 unit for each start but not doing any formal carbohydrate counting  Also not taking any correction doses for any high readings  Mealtimes: She is mostly eating at 11 AM and occasionally in the evening if she has an appetite  She thinks she had an occasional low sugar overnight and reduced her LEVEMIR by 1 unit in the evening  Hypoglycemia: She has had 2 episodes, both of them related to over correcting her fasting blood sugar of over 300   Glucose monitoring:  is being done 3-5 times a day         Glucometer:  [employer] brand  Blood Glucose readings from download  Mean values apply above for all meters except median for One Touch  PRE-MEAL Fasting Lunch  evening Bedtime Overall  Glucose range:  70-500+      Mean/median:  166   190  339  245+/-165    Factors causing hypoglycemia: Excessive insulin for high sugars, occasionally overnight, Symptoms of hypoglycemia:Weakness Treatment of hypoglycemia: As above          Self-care: The diet that the patient has been following is: Carbohydrate counting Usually eating cream of wheat at breakfast, mostly salad at lunch and dinner is usually at 8-10 PM  Snacks usually are apple or crackers  Exercise: none          Dietician consultation: Most recent: 5/17 in the hospital .          CDE consultation:?  Wt Readings from Last 3 Encounters:  06/03/17 142 lb 6.4 oz (64.6 kg)  05/08/17 144 lb (65.3 kg)  02/23/17 147 lb 0.8 oz (66.7 kg)    Diabetes labs:  Lab Results  Component Value Date   HGBA1C 8.7 11/29/2016   HGBA1C 7.8 (H) 09/04/2016   HGBA1C 7.9 (H) 01/17/2016   Lab Results  Component Value Date   MICROALBUR 93.9 (H) 09/04/2016   LDLCALC 103 (H) 09/04/2016   CREATININE 5.96 (H) 05/15/2017   Microalbumin ratio 5011 done in 2/17  Lab Results  Component Value Date   MICRALBCREAT 90.6 (H) 09/04/2016     Allergies as of 06/03/2017      Reactions   Ivp Dye [iodinated Diagnostic Agents]  Other (See Comments)   Burning   Propranolol Hcl Er Swelling   Iodine Rash   Nickel Rash      Medication List        Accurate as of 06/03/17  9:16 PM. Always use your most recent med list.          ACCU-CHEK AVIVA PLUS w/Device Kit Use to check blood sugars 5 times daily   ACCU-CHEK FASTCLIX LANCETS Misc As directed   amLODipine 10 MG tablet Commonly known as:  NORVASC TAKE ONE TABLET BY MOUTH ONCE DAILY   buPROPion 150 MG 24 hr tablet Commonly known as:  WELLBUTRIN XL Take 150 mg daily by mouth.   glucagon 1 MG injection Commonly known as:  GLUCAGON EMERGENCY Inject 1 mg into the skin once as needed for up to 1 dose. Inject '1mg'$  into skin once as needed for low blood sugar.   glucose blood test strip Commonly known as:  ACCU-CHEK AVIVA PLUS Use to check blood sugars four times daily   insulin aspart 100 UNIT/ML injection Commonly known as:  NOVOLOG Inject 0-20 Units into the skin 3 (three) times daily before meals. Sliding scale   Insulin Detemir 100 UNIT/ML Pen Commonly known as:  LEVEMIR Inject 10 units in the morning and inject 11 units in the evening.   losartan 25 MG tablet Commonly known as:  COZAAR Take 25 mg by mouth at bedtime.   nortriptyline 10 MG capsule Commonly known as:  PAMELOR Take 1 capsule (10 mg total) by mouth at bedtime.   ondansetron 4 MG disintegrating tablet Commonly known as:  ZOFRAN ODT Take 1 tablet (4 mg total) by mouth every 8 (eight) hours as needed.   rizatriptan 5 MG disintegrating tablet Commonly known as:  MAXALT-MLT Take 1 tablet (5 mg total) by mouth as needed. May repeat in 2 hours if needed   sevelamer 800 MG tablet Commonly known as:  RENAGEL TAKE 1 TABLET BY MOUTH THREE TIMES A DAY WITH MEALS       Allergies:  Allergies  Allergen Reactions  . Ivp Dye [Iodinated Diagnostic Agents] Other (See Comments)    Burning   . Propranolol Hcl Er Swelling  . Iodine Rash  . Nickel Rash    Past Medical History:    Diagnosis Date  . Anemia   . Chronic kidney disease   . Depression   . Diabetes mellitus    diagnosed at age 69; Type 1  . Diabetic retinopathy ([place])   . GERD (gastroesophageal reflux disease)   . Headache   . Hypertension   .  Sickle cell trait [facility])     Past Surgical History:  Procedure Laterality Date  . AV FISTULA PLACEMENT Right 10/10/2016   Procedure: CREATION OF RIGHT ARM RADIOCEPHALIC ARTERIOVENOUS (AV) FISTULA;  Surgeon: Serafina Mitchell, MD;  Location: [place];  Service: Vascular;  Laterality: Right;  . CESAREAN SECTION N/A 10/19/2015   Procedure: CESAREAN SECTION;  Surgeon: Chancy Milroy, MD;  Location: [place];  Service: Obstetrics;  Laterality: N/A;  . lipo suction  2015  . TONSILLECTOMY    . TONSILLECTOMY AND ADENOIDECTOMY      Family History  Problem Relation Age of Onset  . Cancer Maternal Grandmother        colon and breast  . Hypertension Maternal Grandmother   . Diabetes Maternal Grandfather   . Hypertension Maternal Grandfather   . Diabetes Mother   . Hypertension Mother   . Diabetes Father   . Hypertension Father   . Sickle cell trait Father   . Diabetes Paternal Grandmother   . Hypertension Paternal Grandmother   . Diabetes Paternal Grandfather   . Hypertension Paternal Grandfather     Social History:  reports that she has never smoked. She has never used smokeless tobacco. She reports that she drinks alcohol. She reports that she does not use drugs.      Review of Systems   She has she had increased liver functions in the ER renal insufficiency and nephrotic syndrome followed by nephrologist at dialysis   She had increased liver functions in the ER recently, she was told to follow-up with PCP but she has not done so   Also complaining of some night sweats, does not check her sugar at those times  LABS:  Appointment on 05/29/2017  Component Date Value Ref Range Status  . Fructosamine 05/29/2017 471* 0 - 285 umol/L Final    Comment: Published reference interval for apparently healthy subjects between age 17 and 72 is 61 - 285 umol/L and in a poorly controlled diabetic population is 228 - 563 umol/L with a mean of 396 umol/L.   Marland Kitchen Glucose, Bld 05/29/2017 135* 70 - 99 mg/dL Final    Physical Examination:  BP 130/80 (BP Location: Left Arm, Patient Position: Sitting, Cuff Size: Normal)   Pulse 90   Ht '5\' 5"'$  (1.651 m)   Wt 142 lb 6.4 oz (64.6 kg)   SpO2 99%   BMI 23.70 kg/m    ASSESSMENT:  Diabetes type 1 With labile blood sugars See history of present illness for detailed discussion of current diabetes management, blood sugar patterns and problems identified  Her fructosamine is high indicating poor control  She does not follow-up regularly as directed  In the last week her blood sugars are all consistently high and not clear if she is taking her insulin as directed  Since she is more afraid of hypoglycemia and does not understand the duration of action of NovoLog she is not appropriately taking her insulin regimen  Fasting readings are fairly consistently high and also in the last week evening blood sugars are high Difficult to know what readings are postprandial She is only approximately taking mealtime coverage and not clear if she is doing this consistently   Abnormal liver function: Emphasized the need to have her follow-up with her PCP for this and other issues  PLAN:    Start checking blood sugars 4 times a day, will start with a Accu-Chek meter for which a prescription was sent again  She will keep a  detailed record of her food intake, pre-and post meal blood sugar and insulin doses and review with nurse educator to see how she is calculating her insulin doses for meals  Consistent monitoring after meals  She will increase her Levemir to 12 units in the morning  However on dialysis days she will take only 8 units in the morning  She does need to take mealtime insulin at all times  regardless of whether she is having dialysis or not  Also for high fat meals she will add extra 2 to 3 units such as for fast food  She may use less insulin for correction and probably take 1 unit for about 70 mg  Protein snack at bedtime  Patient Instructions  Take levemir 12 in am and on dialysis days take 8  Take 11 Levemir in pm daily  Extra 2-3 units for hi fat meals See PCP for liver asap  Check sugar 4x daily          Elayne Snare 06/03/2017, 9:16 PM   Note: This note was prepared with Dragon voice recognition system technology. Any transcriptional errors that result from this process are unintentional.

## 2017-06-03 ENCOUNTER — Ambulatory Visit (INDEPENDENT_AMBULATORY_CARE_PROVIDER_SITE_OTHER): Payer: Medicare Other | Admitting: Endocrinology

## 2017-06-03 ENCOUNTER — Encounter: Payer: Self-pay | Admitting: Endocrinology

## 2017-06-03 VITALS — BP 130/80 | HR 90 | Ht 65.0 in | Wt 142.4 lb

## 2017-06-03 DIAGNOSIS — E1065 Type 1 diabetes mellitus with hyperglycemia: Secondary | ICD-10-CM | POA: Diagnosis not present

## 2017-06-03 MED ORDER — GLUCAGON (RDNA) 1 MG IJ KIT: 1.0000 mg | PACK | Freq: Once | INTRAMUSCULAR | 12 refills | Status: AC | PRN

## 2017-06-03 MED ORDER — GLUCOSE BLOOD VI STRP: ORAL_STRIP | 3 refills | Status: DC

## 2017-06-03 NOTE — Patient Instructions (Addendum)
Take levemir 12 in am and on dialysis days take 8  Take 11 Levemir in pm daily  Extra 2-3 units for hi fat meals See PCP for liver asap  Check sugar 4x daily

## 2017-06-27 ENCOUNTER — Encounter: Payer: Self-pay | Admitting: Family Medicine

## 2017-07-02 ENCOUNTER — Encounter: Payer: Self-pay | Admitting: Family Medicine

## 2017-07-02 ENCOUNTER — Ambulatory Visit: Admit: 2017-07-02 | Discharge: 2017-07-03 | Payer: MEDICARE

## 2017-07-02 DIAGNOSIS — Z7682 Awaiting organ transplant status: Principal | ICD-10-CM

## 2017-07-09 ENCOUNTER — Telehealth: Payer: Self-pay | Admitting: Neurology

## 2017-07-09 NOTE — Telephone Encounter (Signed)
Pt requesting a call back to be advised on taking tylenol for body aches and such. Stating on her last office visit she had been told to not take this medication for different reason, please call to advise. Pt also requesting to discuss her MRI.

## 2017-07-09 NOTE — Telephone Encounter (Signed)
Spoke to patient - she was instructed by Dr. Krista Blue to stop daily use of NSAIDS.  She wanted to know if she could take Tylenol sparingly when she had body aches, menstrual cramps, etc.  I explained that infrequent use of NSAIDS is okay but daily use can cause her headaches to worsen.  She verbalized understanding.  Additionally, she requested [place] phone number to call and schedule her ordered scans.  This information was provided to her.

## 2017-07-15 ENCOUNTER — Other Ambulatory Visit: Payer: Self-pay | Admitting: Neurology

## 2017-07-15 ENCOUNTER — Ambulatory Visit
Admission: RE | Admit: 2017-07-15 | Discharge: 2017-07-15 | Disposition: A | Discharge status: Home / Self Care | Payer: Medicare Other | Source: Ambulatory Visit | Attending: Neurology | Admitting: Neurology

## 2017-07-15 DIAGNOSIS — R519 Headache, unspecified: Secondary | ICD-10-CM

## 2017-07-15 DIAGNOSIS — IMO0002 Reserved for concepts with insufficient information to code with codable children: Secondary | ICD-10-CM

## 2017-07-15 DIAGNOSIS — G43709 Chronic migraine without aura, not intractable, without status migrainosus: Secondary | ICD-10-CM | POA: Diagnosis not present

## 2017-07-15 DIAGNOSIS — R51 Headache: Principal | ICD-10-CM

## 2017-07-17 ENCOUNTER — Other Ambulatory Visit: Payer: Self-pay

## 2017-07-21 NOTE — Progress Notes (Deleted)
Patient ID: Christine Reed, female   DOB: [birth year], 32 y.o.   MRN: [id number]           Reason for Appointment : Follow-up for Type 1 Diabetes  History of Present Illness          Diagnosis: Type 1 diabetes mellitus, date of diagnosis: 2000         Previous history:   She was initially started on NPH and Regular Insulin and subsequently was on Lantus and Humalog Usually has had poor control but records are not available for the last few years from her previous endocrinologist In April her Lantus was changed Novolin N presumably because of tendency to low sugars during the night  Recent history:   INSULIN regimen is: Novolog at meals carbohydrate coverage 1:10, correction factor 1 units per 50 mg over 150 Levemir 10-morning and 10 units in evening at bedtime  She has not been seen in follow-up since 11/2016  A1c not done since she is only doing the labs that were supposed to be done for her 01/2017 visit  Eaing at 11 am and ? pm  Current management, blood sugar patterns and problems identified:    She has checked her blood sugars again with the [employer] monitor despite informing her that she needs to use the Accu-Chek meter  She did not have the right testis for the Accu-Chek but did not request a prescription again  She is checking her blood sugars somewhat irregularly and mostly in the morning with another reading randomly in the evening  Although her blood sugars are looking reasonably good in the middle of April they have been mostly high in the last week for no particular reason except for 1 day  She is now on DIALYSIS  She claims that when she goes for dialysis she will get hypoglycemic relatively frequently within a couple of hours; she starts dialysis at about noon  For this reason she recently has been trying to keep her morning sugars high and not take any NovoLog since she thinks NovoLog will drop her sugar low in the afternoon  She is approximately counting her  carbohydrates and taking 1 unit for each start but not doing any formal carbohydrate counting  Also not taking any correction doses for any high readings  Mealtimes: She is mostly eating at 11 AM and occasionally in the evening if she has an appetite  She thinks she had an occasional low sugar overnight and reduced her LEVEMIR by 1 unit in the evening  Hypoglycemia: She has had 2 episodes, both of them related to over correcting her fasting blood sugar of over 300   Glucose monitoring:  is being done 3-5 times a day         Glucometer:  [employer] brand  Blood Glucose readings from download  Mean values apply above for all meters except median for One Touch  PRE-MEAL Fasting Lunch  evening Bedtime Overall  Glucose range:  70-500+      Mean/median:  166   190  339  245+/-165    Factors causing hypoglycemia: Excessive insulin for high sugars, occasionally overnight, Symptoms of hypoglycemia:Weakness Treatment of hypoglycemia: As above          Self-care: The diet that the patient has been following is: Carbohydrate counting Usually eating cream of wheat at breakfast, mostly salad at lunch and dinner is usually at 8-10 PM  Snacks usually are apple or crackers  Exercise: none          Dietician consultation: Most recent: 5/17 in the hospital .          CDE consultation:?  Wt Readings from Last 3 Encounters:  06/03/17 142 lb 6.4 oz (64.6 kg)  05/08/17 144 lb (65.3 kg)  02/23/17 147 lb 0.8 oz (66.7 kg)    Diabetes labs:  Lab Results  Component Value Date   HGBA1C 8.7 11/29/2016   HGBA1C 7.8 (H) 09/04/2016   HGBA1C 7.9 (H) 01/17/2016   Lab Results  Component Value Date   MICROALBUR 93.9 (H) 09/04/2016   LDLCALC 103 (H) 09/04/2016   CREATININE 5.96 (H) 05/15/2017   Microalbumin ratio 5011 done in 2/17  Lab Results  Component Value Date   MICRALBCREAT 90.6 (H) 09/04/2016     Allergies as of 07/22/2017      Reactions   Ivp Dye [iodinated Diagnostic Agents]  Other (See Comments)   Burning   Propranolol Hcl Er Swelling   Iodine Rash   Nickel Rash      Medication List        Accurate as of 07/21/17  9:30 PM. Always use your most recent med list.          ACCU-CHEK AVIVA PLUS w/Device Kit Use to check blood sugars 5 times daily   ACCU-CHEK FASTCLIX LANCETS Misc As directed   amLODipine 10 MG tablet Commonly known as:  NORVASC TAKE ONE TABLET BY MOUTH ONCE DAILY   buPROPion 150 MG 24 hr tablet Commonly known as:  WELLBUTRIN XL Take 150 mg daily by mouth.   glucagon 1 MG injection Commonly known as:  GLUCAGON EMERGENCY Inject 1 mg into the skin once as needed for up to 1 dose. Inject 52m into skin once as needed for low blood sugar.   glucose blood test strip Commonly known as:  ACCU-CHEK AVIVA PLUS Use to check blood sugars four times daily   insulin aspart 100 UNIT/ML injection Commonly known as:  NOVOLOG Inject 0-20 Units into the skin 3 (three) times daily before meals. Sliding scale   Insulin Detemir 100 UNIT/ML Pen Commonly known as:  LEVEMIR Inject 10 units in the morning and inject 11 units in the evening.   losartan 25 MG tablet Commonly known as:  COZAAR Take 25 mg by mouth at bedtime.   nortriptyline 10 MG capsule Commonly known as:  PAMELOR Take 1 capsule (10 mg total) by mouth at bedtime.   ondansetron 4 MG disintegrating tablet Commonly known as:  ZOFRAN ODT Take 1 tablet (4 mg total) by mouth every 8 (eight) hours as needed.   rizatriptan 5 MG disintegrating tablet Commonly known as:  MAXALT-MLT Take 1 tablet (5 mg total) by mouth as needed. May repeat in 2 hours if needed   sevelamer 800 MG tablet Commonly known as:  RENAGEL TAKE 1 TABLET BY MOUTH THREE TIMES A DAY WITH MEALS       Allergies:  Allergies  Allergen Reactions  . Ivp Dye [Iodinated Diagnostic Agents] Other (See Comments)    Burning   . Propranolol Hcl Er Swelling  . Iodine Rash  . Nickel Rash    Past Medical History:    Diagnosis Date  . Anemia   . Chronic kidney disease   . Depression   . Diabetes mellitus    diagnosed at age 89 Type 1  . [place]   . GERD (gastroesophageal reflux disease)   . Headache   . Hypertension   .  Sickle cell trait [facility])     Past Surgical History:  Procedure Laterality Date  . AV FISTULA PLACEMENT Right 10/10/2016   Procedure: CREATION OF RIGHT ARM RADIOCEPHALIC ARTERIOVENOUS (AV) FISTULA;  Surgeon: Serafina Mitchell, MD;  Location: [place];  Service: Vascular;  Laterality: Right;  . CESAREAN SECTION N/A 10/19/2015   Procedure: CESAREAN SECTION;  Surgeon: Chancy Milroy, MD;  Location: [place];  Service: Obstetrics;  Laterality: N/A;  . lipo suction  2015  . TONSILLECTOMY    . TONSILLECTOMY AND ADENOIDECTOMY      Family History  Problem Relation Age of Onset  . Cancer Maternal Grandmother        colon and breast  . Hypertension Maternal Grandmother   . Diabetes Maternal Grandfather   . Hypertension Maternal Grandfather   . Diabetes Mother   . Hypertension Mother   . Diabetes Father   . Hypertension Father   . Sickle cell trait Father   . Diabetes Paternal Grandmother   . Hypertension Paternal Grandmother   . Diabetes Paternal Grandfather   . Hypertension Paternal Grandfather     Social History:  reports that she has never smoked. She has never used smokeless tobacco. She reports that she drinks alcohol. She reports that she does not use drugs.      Review of Systems   She has she had increased liver functions in the ER renal insufficiency and nephrotic syndrome followed by nephrologist at dialysis   She had increased liver functions in the ER recently, she was told to follow-up with PCP but she has not done so   Also complaining of some night sweats, does not check her sugar at those times  LABS:  No visits with results within 1 Week(s) from this visit.  Latest known visit with results is:  Appointment on 05/29/2017   Component Date Value Ref Range Status  . Fructosamine 05/29/2017 471* 0 - 285 umol/L Final   Comment: Published reference interval for apparently healthy subjects between age 62 and 78 is 49 - 285 umol/L and in a poorly controlled diabetic population is 228 - 563 umol/L with a mean of 396 umol/L.   Marland Kitchen Glucose, Bld 05/29/2017 135* 70 - 99 mg/dL Final    Physical Examination:  There were no vitals taken for this visit.   ASSESSMENT:  Diabetes type 1 With labile blood sugars See history of present illness for detailed discussion of current diabetes management, blood sugar patterns and problems identified  Her fructosamine is high indicating poor control  She does not follow-up regularly as directed  In the last week her blood sugars are all consistently high and not clear if she is taking her insulin as directed  Since she is more afraid of hypoglycemia and does not understand the duration of action of NovoLog she is not appropriately taking her insulin regimen  Fasting readings are fairly consistently high and also in the last week evening blood sugars are high Difficult to know what readings are postprandial She is only approximately taking mealtime coverage and not clear if she is doing this consistently   Abnormal liver function: Emphasized the need to have her follow-up with her PCP for this and other issues  PLAN:    Start checking blood sugars 4 times a day, will start with a Accu-Chek meter for which a prescription was sent again  She will keep a detailed record of her food intake, pre-and post meal blood sugar and insulin doses and review  with nurse educator to see how she is calculating her insulin doses for meals  Consistent monitoring after meals  She will increase her Levemir to 12 units in the morning  However on dialysis days she will take only 8 units in the morning  She does need to take mealtime insulin at all times regardless of whether she is having  dialysis or not  Also for high fat meals she will add extra 2 to 3 units such as for fast food  She may use less insulin for correction and probably take 1 unit for about 70 mg  Protein snack at bedtime  There are no Patient Instructions on file for this visit.     Elayne Snare 07/21/2017, 9:30 PM   Note: This note was prepared with Dragon voice recognition system technology. Any transcriptional errors that result from this process are unintentional.

## 2017-07-22 ENCOUNTER — Ambulatory Visit: Payer: Self-pay | Admitting: Endocrinology

## 2017-07-26 ENCOUNTER — Other Ambulatory Visit: Payer: Self-pay | Admitting: Obstetrics and Gynecology

## 2017-07-26 DIAGNOSIS — N6320 Unspecified lump in the left breast, unspecified quadrant: Secondary | ICD-10-CM

## 2017-08-05 ENCOUNTER — Ambulatory Visit
Admission: RE | Admit: 2017-08-05 | Discharge: 2017-08-05 | Disposition: A | Discharge status: Home / Self Care | Payer: Medicare Other | Source: Ambulatory Visit | Attending: Obstetrics and Gynecology | Admitting: Obstetrics and Gynecology

## 2017-08-05 ENCOUNTER — Other Ambulatory Visit: Payer: Self-pay | Admitting: Obstetrics and Gynecology

## 2017-08-05 DIAGNOSIS — N6320 Unspecified lump in the left breast, unspecified quadrant: Secondary | ICD-10-CM

## 2017-08-05 DIAGNOSIS — R599 Enlarged lymph nodes, unspecified: Secondary | ICD-10-CM

## 2017-08-07 ENCOUNTER — Ambulatory Visit
Admission: RE | Admit: 2017-08-07 | Discharge: 2017-08-07 | Disposition: A | Discharge status: Home / Self Care | Payer: Medicare Other | Source: Ambulatory Visit | Attending: Obstetrics and Gynecology | Admitting: Obstetrics and Gynecology

## 2017-08-07 ENCOUNTER — Other Ambulatory Visit: Payer: Self-pay | Admitting: Obstetrics and Gynecology

## 2017-08-07 DIAGNOSIS — R599 Enlarged lymph nodes, unspecified: Secondary | ICD-10-CM

## 2017-08-07 DIAGNOSIS — N6320 Unspecified lump in the left breast, unspecified quadrant: Secondary | ICD-10-CM

## 2017-08-13 ENCOUNTER — Ambulatory Visit: Payer: Medicare Other | Admitting: Neurology

## 2017-08-15 ENCOUNTER — Other Ambulatory Visit: Payer: Self-pay | Admitting: Endocrinology

## 2017-09-18 ENCOUNTER — Encounter: Payer: Self-pay | Admitting: Neurology

## 2017-09-18 ENCOUNTER — Ambulatory Visit (INDEPENDENT_AMBULATORY_CARE_PROVIDER_SITE_OTHER): Payer: Medicare Other | Admitting: Neurology

## 2017-09-18 VITALS — BP 126/78 | HR 92 | Ht 65.0 in | Wt 145.5 lb

## 2017-09-18 DIAGNOSIS — IMO0002 Reserved for concepts with insufficient information to code with codable children: Secondary | ICD-10-CM

## 2017-09-18 DIAGNOSIS — G43709 Chronic migraine without aura, not intractable, without status migrainosus: Secondary | ICD-10-CM

## 2017-09-18 MED ORDER — FREMANEZUMAB-VFRM 225 MG/1.5ML ~~LOC~~ SOSY: 225.0000 mg | PREFILLED_SYRINGE | SUBCUTANEOUS | 11 refills | Status: DC

## 2017-09-18 MED ORDER — DILTIAZEM HCL ER COATED BEADS 120 MG PO CP24: 120.0000 mg | ORAL_CAPSULE | Freq: Every day | ORAL | 11 refills | Status: DC

## 2017-09-18 MED ORDER — NORTRIPTYLINE HCL 10 MG PO CAPS: 20.0000 mg | ORAL_CAPSULE | Freq: Every day | ORAL | 11 refills | Status: DC

## 2017-09-18 NOTE — Progress Notes (Signed)
PATIENT: Christine Reed DOB: 04-Mar-1985  Chief Complaint  Patient presents with  . Migraine    She would like to review the results of her MRI and MRA.  She had an adverse reaction to propranolol.  She never started the nortriptyline 63m sent to the pharmacy for her.  She is now getting headaches nearly everyday and not just after dialysis.  Maxalt helps some of the time.      HISTORICAL  Christine Reed a [age]year old female, seen in refer by her nephrologist Dr. SJoelyn Oms RMeredith Leeds for evaluation of headaches, his primary care physician is Dr. RMerrilee Seashore initial evaluation was on May 08, 2017.  She has past medical history of hypertension, type 1 diabetes, insulin-dependent, end-stage renal disease, on dialysis since September 2018, Monday Wednesday and Fridays, [facility] She reported a history of migraine headaches since young, is usually right retro-orbital area severe pounding headache with associated light noise sensitivity, nauseous, on dialysis, she has migraine headache once or twice each months, but since she started dialysis in September 2018, she has daily migraine headaches, getting worse after each dialysis section.  Before she go on dialysis, she is now taking 3 tablets of Tylenol, 1 Benadryl, dialysis last about 4 hours, at the end of 3 hours, she began to notice gradually building up of pounding headache at bilateral retro-orbital area, sometimes with light noise sensitivity, nauseous, often she has to abort dialysis half to 1 hour early, the headache would go on rest of the day, she will take 2 extra dose of Tylenol.  In the days that she does not have dialysis, her headache is better, but still has mild to moderate retro-orbital area pressure headaches, sometimes pounding.  She usually take Tylenol 2 tablets every morning.  She complains of blurry vision during intense headaches, no lateralized motor or sensory  deficit,  CT head without contrast in 2018 showed no acute abnormality.  Laboratory evaluation in October 2018 showed negative hepatitis B surface antigen, hepatitis B core antibody, hemoglobin of 11.2, creatinine of 7.4 A1c was 9.6,  UPDATE September 18 2017: I personally reviewed MRI brain, MRA brain that was normal in June 2019  She could not tolerate Inderal because swelling of her throat, and stopped taking it, she is on 2 hypertension medication including amlodipine, she continue have headaches after each dialysis, taking Maxalt after she gets home from dialysis, taking a nap, for her bilateral frontal pounding headache with associated light noise sensitivity, movement made it worse, she often take Benadryl Sudafed before dialysis.  She has stopped frequent other over-the-counter medication use.  REVIEW OF SYSTEMS: Full 14 system review of systems performed and notable only for  Headache, excessive sweating, insomnia ALLERGIES: Allergies  Allergen Reactions  . Ivp Dye [Iodinated Diagnostic Agents] Other (See Comments)    Burning   . Propranolol Hcl Er Swelling  . Iodine Rash  . Nickel Rash    HOME MEDICATIONS: Current Outpatient Medications  Medication Sig Dispense Refill  . ACCU-CHEK FASTCLIX LANCETS MISC As directed 100 each 0  . amLODipine (NORVASC) 10 MG tablet TAKE ONE TABLET BY MOUTH ONCE DAILY 30 tablet 0  . Blood Glucose Monitoring Suppl (ACCU-CHEK AVIVA PLUS) w/Device KIT Use to check blood sugars 5 times daily (Patient taking differently: 1 each by Other route 4 (four) times daily. Use to check blood sugars 3-4 times daily) 1 kit 1  . buPROPion ([facility]  XL) 150 MG 24 hr tablet Take 150 mg daily by mouth.    Marland Kitchen glucagon (GLUCAGON EMERGENCY) 1 MG injection Inject 1 mg into the skin once as needed for up to 1 dose. Inject 4m into skin once as needed for low blood sugar. 1 each 12  . glucose blood (ACCU-CHEK AVIVA PLUS) test strip Use to check blood sugars four times daily  150 each 3  . insulin aspart (NOVOLOG) 100 UNIT/ML injection Inject 0-20 Units into the skin 3 (three) times daily before meals. Sliding scale 10 mL 3  . LEVEMIR FLEXTOUCH 100 UNIT/ML Pen INJECT 10 UNITS INTO THE SKIN IN THE MORNING AND INJECT 11 UNITS INTO THE SKIN IN THE EVENING. 3 pen 3  . losartan (COZAAR) 25 MG tablet Take 25 mg by mouth at bedtime.     . nortriptyline (PAMELOR) 10 MG capsule Take 1 capsule (10 mg total) by mouth at bedtime. 30 capsule 11  . ondansetron (ZOFRAN ODT) 4 MG disintegrating tablet Take 1 tablet (4 mg total) by mouth every 8 (eight) hours as needed. 20 tablet 6  . rizatriptan (MAXALT-MLT) 5 MG disintegrating tablet Take 1 tablet (5 mg total) by mouth as needed. May repeat in 2 hours if needed 15 tablet 12  . sevelamer (RENAGEL) 800 MG tablet TAKE 1 TABLET BY MOUTH THREE TIMES A DAY WITH MEALS  12   No current facility-administered medications for this visit.     PAST MEDICAL HISTORY: Past Medical History:  Diagnosis Date  . Anemia   . Chronic kidney disease   . Depression   . Diabetes mellitus    diagnosed at age 32 Type 1  . Diabetic retinopathy ([place]   . GERD (gastroesophageal reflux disease)   . Headache   . Hypertension   . Sickle cell trait ([place]     PAST SURGICAL HISTORY: Past Surgical History:  Procedure Laterality Date  . AV FISTULA PLACEMENT Right 10/10/2016   Procedure: CREATION OF RIGHT ARM RADIOCEPHALIC ARTERIOVENOUS (AV) FISTULA;  Surgeon: BSerafina Mitchell MD;  [facility]  Service: Vascular;  Laterality: Right;  . CESAREAN SECTION N/A 10/19/2015   Procedure: CESAREAN SECTION;  Surgeon: MChancy Milroy MD;  [facility]  Service: Obstetrics;  Laterality: N/A;  . lipo suction  2015  . TONSILLECTOMY    . TONSILLECTOMY AND ADENOIDECTOMY      FAMILY HISTORY: Family History  Problem Relation Age of Onset  . Cancer Maternal Grandmother        colon and breast  . Hypertension Maternal Grandmother   . Breast  cancer Maternal Grandmother   . Diabetes Maternal Grandfather   . Hypertension Maternal Grandfather   . Diabetes Mother   . Hypertension Mother   . Diabetes Father   . Hypertension Father   . Sickle cell trait Father   . Diabetes Paternal Grandmother   . Hypertension Paternal Grandmother   . Diabetes Paternal Grandfather   . Hypertension Paternal Grandfather     SOCIAL HISTORY:  Social History   Socioeconomic History  . Marital status: Single    Spouse name: Not on file  . Number of children: 1  . Years of education: some college  . Highest education level: Not on file  Occupational History  . Occupation: Disabled  Social Needs  . Financial resource strain: Not on file  . Food insecurity:    Worry: Not on file    Inability: Not on file  . Transportation needs:  Medical: Not on file    Non-medical: Not on file  Tobacco Use  . Smoking status: Never Smoker  . Smokeless tobacco: Never Used  Substance and Sexual Activity  . Alcohol use: Yes    Comment: very rarely  . Drug use: No  . Sexual activity: Not Currently  Lifestyle  . Physical activity:    Days per week: Not on file    Minutes per session: Not on file  . Stress: Not on file  Relationships  . Social connections:    Talks on phone: Not on file    Gets together: Not on file    Attends religious service: Not on file    Active member of club or organization: Not on file    Attends meetings of clubs or organizations: Not on file    Relationship status: Not on file  . Intimate partner violence:    Fear of current or ex partner: Not on file    Emotionally abused: Not on file    Physically abused: Not on file    Forced sexual activity: Not on file  Other Topics Concern  . Not on file  Social History Narrative   Lives at home with mother.   Right-handed.   4-5 cups caffeine per week.     PHYSICAL EXAM   Vitals:   09/18/17 0824  BP: 126/78  Pulse: 92  Weight: 145 lb 8 oz (66 kg)  Height: '5\' 5"'   (1.651 m)    Not recorded      Body mass index is 24.21 kg/m.  PHYSICAL EXAMNIATION:  Gen: NAD, conversant, well nourised, obese, well groomed                     Cardiovascular: Regular rate rhythm, no peripheral edema, warm, nontender. Eyes: Conjunctivae clear without exudates or hemorrhage Neck: Supple, no carotid bruits. Pulmonary: Clear to auscultation bilaterally   NEUROLOGICAL EXAM:  MENTAL STATUS: Speech:    Speech is normal; fluent and spontaneous with normal comprehension.  Cognition:     Orientation to time, place and person     Normal recent and remote memory     Normal Attention span and concentration     Normal Language, naming, repeating,spontaneous speech     Fund of knowledge   CRANIAL NERVES: CN II: Visual fields are full to confrontation. Fundoscopic exam is normal with sharp discs and no vascular changes. Pupils are round equal and briskly reactive to light. CN III, IV, VI: extraocular movement are normal. No ptosis. CN V: Facial sensation is intact to pinprick in all 3 divisions bilaterally. Corneal responses are intact.  CN VII: Face is symmetric with normal eye closure and smile. CN VIII: Hearing is normal to rubbing fingers CN IX, X: Palate elevates symmetrically. Phonation is normal. CN XI: Head turning and shoulder shrug are intact CN XII: Tongue is midline with normal movements and no atrophy.  MOTOR: There is no pronator drift of out-stretched arms. Muscle bulk and tone are normal. Muscle strength is normal.  REFLEXES: Reflexes are 2+ and symmetric at the biceps, triceps, knees, and ankles. Plantar responses are flexor.  SENSORY: Intact to light touch, pinprick, positional sensation and vibratory sensation are intact in fingers and toes.  COORDINATION: Rapid alternating movements and fine finger movements are intact. There is no dysmetria on finger-to-nose and heel-knee-shin.    GAIT/STANCE: Posture is normal. Gait is steady with normal  steps, base, arm swing, and turning. Heel and toe walking are  normal. Tandem gait is normal.  Romberg is absent.   DIAGNOSTIC DATA (LABS, IMAGING, TESTING) - I reviewed patient records, labs, notes, testing and imaging myself where available.   ASSESSMENT AND PLAN  NAVEYAH IACOVELLI is a 32 y.o. female    Worsening persistent daily headaches  chronic migraine, with medicine rebound headaches component, I have advised her stop Sudafed  Worsening headache at the end of dialysis section, suggestive of a vascular component,  Could not tolerate Inderal XL 120 mg every day due to swelling.   She is already on calcium channel blocker amlodipine  Add on nortriptyline 10 mg titrating to 20 mg every night as migraine prevention  Add on Ajovy 257m every month as migraine prevention  MAXALT as needed.  YMarcial Pacas M.D. Ph.D.  [facility] [address] [place] [address]Ph: [phone]: [phone] [id number] RMeredith Leeds MD,  RMerrilee Seashore MD

## 2017-09-22 ENCOUNTER — Telehealth: Payer: Self-pay | Admitting: *Deleted

## 2017-09-22 NOTE — Telephone Encounter (Signed)
PA approved for Ajovy. [employer] [id number]. Member ID: [id number].  Valid through 02/10/18.  SW#[id number].

## 2017-11-18 ENCOUNTER — Ambulatory Visit (INDEPENDENT_AMBULATORY_CARE_PROVIDER_SITE_OTHER): Payer: Medicare Other | Admitting: Neurology

## 2017-11-18 ENCOUNTER — Encounter: Payer: Self-pay | Admitting: Neurology

## 2017-11-18 ENCOUNTER — Telehealth: Payer: Self-pay | Admitting: Neurology

## 2017-11-18 VITALS — BP 139/83 | HR 100 | Ht 65.0 in | Wt 154.0 lb

## 2017-11-18 DIAGNOSIS — G43709 Chronic migraine without aura, not intractable, without status migrainosus: Secondary | ICD-10-CM

## 2017-11-18 DIAGNOSIS — IMO0002 Reserved for concepts with insufficient information to code with codable children: Secondary | ICD-10-CM

## 2017-11-18 NOTE — Telephone Encounter (Signed)
Patient estimates drinking only one small bottle of water per day, regardless of dialysis.  She was instructed to increase her water intake to remove dehydration as a contributing factor to her headaches.  She gets her dialysis at [facility] [phone]).  Says she is under the care of Dr. Pearson Grippe.

## 2017-11-18 NOTE — Progress Notes (Addendum)
PATIENT: Christine Reed DOB: [birth year]  Chief Complaint  Patient presents with  . Migraine    She is still having migraines three days per week, after having dialysis.  She is still taking nortriptyline but has been unable to start Ajovy yet.  Maxalt is only mildly helpful.     HISTORICAL  Christine Reed is a 32 year old female, seen in refer by her nephrologist Dr. Joelyn Oms, Meredith Leeds, for evaluation of headaches, his primary care physician is Dr. Merrilee Seashore, initial evaluation was on May 08, 2017.  She has past medical history of hypertension, type 1 diabetes, insulin-dependent, end-stage renal disease, on dialysis since September 2018, Monday Wednesday and Fridays, she is on the list for kidney transplant at [facility]  She reported a history of migraine headaches since young, is usually right retro-orbital area severe pounding headache with associated light noise sensitivity, nauseous, on dialysis, she has migraine headache once or twice each months, but since she started dialysis in September 2018, she has daily migraine headaches, getting worse after each dialysis section.  Before she go on dialysis, she is now taking 3 tablets of Tylenol, 1 Benadryl, dialysis last about 4 hours, at the end of 3 hours, she began to notice gradually building up of pounding headache at bilateral retro-orbital area, sometimes with light noise sensitivity, nauseous, often she has to abort dialysis half to 1 hour early, the headache would go on rest of the day, she will take 2 extra dose of Tylenol.  In the days that she does not have dialysis, her headache is better, but still has mild to moderate retro-orbital area pressure headaches, sometimes pounding.  She usually take Tylenol 2 tablets every morning.  She complains of blurry vision during intense headaches, no lateralized motor or sensory deficit,  CT head without contrast in 2018 showed no acute abnormality.  Laboratory evaluation in  October 2018 showed negative hepatitis B surface antigen, hepatitis B core antibody, hemoglobin of 11.2, creatinine of 7.4 A1c was 9.6,  UPDATE September 18 2017: I personally reviewed MRI brain, MRA brain that was normal in June 2019  She could not tolerate Inderal because swelling of her throat, and stopped taking it, she is on 2 hypertension medication including amlodipine, she continue have headaches after each dialysis, taking Maxalt after she gets home from dialysis, taking a nap, for her bilateral frontal pounding headache with associated light noise sensitivity, movement made it worse, she often take Benadryl Sudafed before dialysis.  She has stopped frequent other over-the-counter medication use.  UPDATE Nov 18 2017: She is now taking Nortriptyline 68m2 tabs qhs, she did not notice significant improvement, maxalt, she still has headache at each of HD section, she has to stop HD 30-60 minutes earlier, as soon as the needles is taken out from her arm, she feels better. She never filled her ajovy, she has stopped her frequent Tylenol use,   REVIEW OF SYSTEMS: Full 14 system review of systems performed and notable only for as above   ALLERGIES: Allergies  Allergen Reactions  . Ivp Dye [Iodinated Diagnostic Agents] Other (See Comments)    Burning   . Propranolol Hcl Er Swelling  . Iodine Rash  . Nickel Rash    HOME MEDICATIONS: Current Outpatient Medications  Medication Sig Dispense Refill  . ACCU-CHEK FASTCLIX LANCETS MISC As directed 100 each 0  . amLODipine (NORVASC) 10 MG tablet TAKE ONE TABLET BY MOUTH ONCE DAILY 30 tablet 0  . Blood Glucose  Monitoring Suppl (ACCU-CHEK AVIVA PLUS) w/Device KIT Use to check blood sugars 5 times daily (Patient taking differently: 1 each by Other route 4 (four) times daily. Use to check blood sugars 3-4 times daily) 1 kit 1  . buPROPion (WELLBUTRIN XL) 150 MG 24 hr tablet Take 150 mg daily by mouth.    . Fremanezumab-vfrm (AJOVY) 225 MG/1.5ML SOSY  Inject 225 mg into the skin every 30 (thirty) days. 1 Syringe 11  . glucagon (GLUCAGON EMERGENCY) 1 MG injection Inject 1 mg into the skin once as needed for up to 1 dose. Inject 47m into skin once as needed for low blood sugar. 1 each 12  . glucose blood (ACCU-CHEK AVIVA PLUS) test strip Use to check blood sugars four times daily 150 each 3  . insulin aspart (NOVOLOG) 100 UNIT/ML injection Inject 0-20 Units into the skin 3 (three) times daily before meals. Sliding scale 10 mL 3  . LEVEMIR FLEXTOUCH 100 UNIT/ML Pen INJECT 10 UNITS INTO THE SKIN IN THE MORNING AND INJECT 11 UNITS INTO THE SKIN IN THE EVENING. 3 pen 3  . losartan (COZAAR) 25 MG tablet Take 25 mg by mouth at bedtime.     . nortriptyline (PAMELOR) 10 MG capsule Take 2 capsules (20 mg total) by mouth at bedtime. 60 capsule 11  . ondansetron (ZOFRAN ODT) 4 MG disintegrating tablet Take 1 tablet (4 mg total) by mouth every 8 (eight) hours as needed. 20 tablet 6  . rizatriptan (MAXALT-MLT) 5 MG disintegrating tablet Take 1 tablet (5 mg total) by mouth as needed. May repeat in 2 hours if needed 15 tablet 12  . sevelamer (RENAGEL) 800 MG tablet TAKE 1 TABLET BY MOUTH THREE TIMES A DAY WITH MEALS  12   No current facility-administered medications for this visit.     PAST MEDICAL HISTORY: Past Medical History:  Diagnosis Date  . Anemia   . Chronic kidney disease   . Depression   . Diabetes mellitus    diagnosed at age 32 Type 1  . Diabetic retinopathy ([place]   . GERD (gastroesophageal reflux disease)   . Headache   . Hypertension   . Sickle cell trait ([place]     PAST SURGICAL HISTORY: Past Surgical History:  Procedure Laterality Date  . AV FISTULA PLACEMENT Right 10/10/2016   Procedure: CREATION OF RIGHT ARM RADIOCEPHALIC ARTERIOVENOUS (AV) FISTULA;  Surgeon: BSerafina Mitchell MD;  [place] [facility]  Service: Vascular;  Laterality: Right;  . CESAREAN SECTION N/A 10/19/2015   Procedure: CESAREAN SECTION;  Surgeon: MChancy Milroy  MD;  [place] [facility]  Service: Obstetrics;  Laterality: N/A;  . lipo suction  2015  . TONSILLECTOMY    . TONSILLECTOMY AND ADENOIDECTOMY      FAMILY HISTORY: Family History  Problem Relation Age of Onset  . Cancer Maternal Grandmother        colon and breast  . Hypertension Maternal Grandmother   . Breast cancer Maternal Grandmother   . Diabetes Maternal Grandfather   . Hypertension Maternal Grandfather   . Diabetes Mother   . Hypertension Mother   . Diabetes Father   . Hypertension Father   . Sickle cell trait Father   . Diabetes Paternal Grandmother   . Hypertension Paternal Grandmother   . Diabetes Paternal Grandfather   . Hypertension Paternal Grandfather     SOCIAL HISTORY:  Social History   Socioeconomic History  . Marital status: Single    Spouse name: Not on file  .  Number of children: 1  . Years of education: some college  . Highest education level: Not on file  Occupational History  . Occupation: Disabled  Social Needs  . Financial resource strain: Not on file  . Food insecurity:    Worry: Not on file    Inability: Not on file  . Transportation needs:    Medical: Not on file    Non-medical: Not on file  Tobacco Use  . Smoking status: Never Smoker  . Smokeless tobacco: Never Used  Substance and Sexual Activity  . Alcohol use: Yes    Comment: very rarely  . Drug use: No  . Sexual activity: Not Currently  Lifestyle  . Physical activity:    Days per week: Not on file    Minutes per session: Not on file  . Stress: Not on file  Relationships  . Social connections:    Talks on phone: Not on file    Gets together: Not on file    Attends religious service: Not on file    Active member of club or organization: Not on file    Attends meetings of clubs or organizations: Not on file    Relationship status: Not on file  . Intimate partner violence:    Fear of current or ex partner: Not on file    Emotionally abused: Not on file     Physically abused: Not on file    Forced sexual activity: Not on file  Other Topics Concern  . Not on file  Social History Narrative   Lives at home with mother.   Right-handed.   4-5 cups caffeine per week.     PHYSICAL EXAM   Vitals:   11/18/17 1319  BP: 139/83  Pulse: 100  Weight: 154 lb (69.9 kg)  Height: '5\' 5"'  (1.651 m)    Not recorded      Body mass index is 25.63 kg/m.  PHYSICAL EXAMNIATION:  Gen: NAD, conversant, well nourised, obese, well groomed                     Cardiovascular: Regular rate rhythm, no peripheral edema, warm, nontender. Eyes: Conjunctivae clear without exudates or hemorrhage Neck: Supple, no carotid bruits. Pulmonary: Clear to auscultation bilaterally   NEUROLOGICAL EXAM:  MENTAL STATUS: Speech:    Speech is normal; fluent and spontaneous with normal comprehension.  Cognition:     Orientation to time, place and person     Normal recent and remote memory     Normal Attention span and concentration     Normal Language, naming, repeating,spontaneous speech     Fund of knowledge   CRANIAL NERVES: CN II: Visual fields are full to confrontation. Fundoscopic exam is normal with sharp discs and no vascular changes. Pupils are round equal and briskly reactive to light. CN III, IV, VI: extraocular movement are normal. No ptosis. CN V: Facial sensation is intact to pinprick in all 3 divisions bilaterally. Corneal responses are intact.  CN VII: Face is symmetric with normal eye closure and smile. CN VIII: Hearing is normal to rubbing fingers CN IX, X: Palate elevates symmetrically. Phonation is normal. CN XI: Head turning and shoulder shrug are intact CN XII: Tongue is midline with normal movements and no atrophy.  MOTOR: There is no pronator drift of out-stretched arms. Muscle bulk and tone are normal. Muscle strength is normal.  REFLEXES: Reflexes are 2+ and symmetric at the biceps, triceps, knees, and ankles. Plantar responses are  flexor.  SENSORY: Intact to light touch, pinprick, positional sensation and vibratory sensation are intact in fingers and toes.  COORDINATION: Rapid alternating movements and fine finger movements are intact. There is no dysmetria on finger-to-nose and heel-knee-shin.    GAIT/STANCE: Posture is normal. Gait is steady with normal steps, base, arm swing, and turning. Heel and toe walking are normal. Tandem gait is normal.  Romberg is absent.   DIAGNOSTIC DATA (LABS, IMAGING, TESTING) - I reviewed patient records, labs, notes, testing and imaging myself where available.   ASSESSMENT AND PLAN  Christine Reed is a 32 y.o. female    Worsening persistent daily headaches  chronic migraine, with medicine rebound headaches component, I have advised her stop daily Sudafed and Tylenol, Benadryl use  Worsening headache at the end of dialysis section, suggestive of a vascular component,  Could not tolerate Inderal XL 120 mg every day due to allergic reaction, throat swelling.   She is already on calcium channel blocker amlodipine  Is able to tolerate nortriptyline 10 mg titrating to 20 mg every night as migraine prevention  Add on Ajovy 276m every month as migraine prevention  MAXALT as needed.  Also advised her to increase water intake during dialysis, the headache at the end of HD could due to too much fluid is removed from HD.  I was able to discuss above with her HD facility nurse, her HD physician is Dr. SJoelyn Oms RThurmond [phone]  YMarcial Pacas M.D. Ph.D.  [facility] [address] [place]idgecrest [place] [address]Ph: [phone]: [phone] [id number] RMeredith Leeds MD,  RMerrilee Seashore MD

## 2017-11-18 NOTE — Telephone Encounter (Signed)
Can you ask her how much water does she drink regularly on day of dialysis and the day of not receiving HD.  Which HD facility, number for me to talk with staff there?

## 2017-11-29 IMAGING — US US RENAL
1 series · 15 of 25 positions shown · non-contrast
Comparison: 03/11/2012 CT abdomen/ pelvis.

CLINICAL DATA: Chronic kidney disease. Type 1 diabetes.
First-trimester of pregnancy.

EXAM:
RENAL / URINARY TRACT ULTRASOUND COMPLETE

[Series 1: us renal · 15 of 43 slices shown]
[im 1/43]
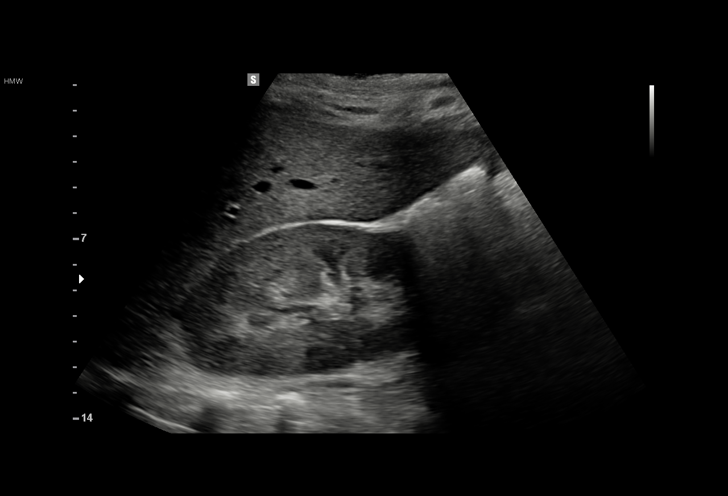
[im 4/43]
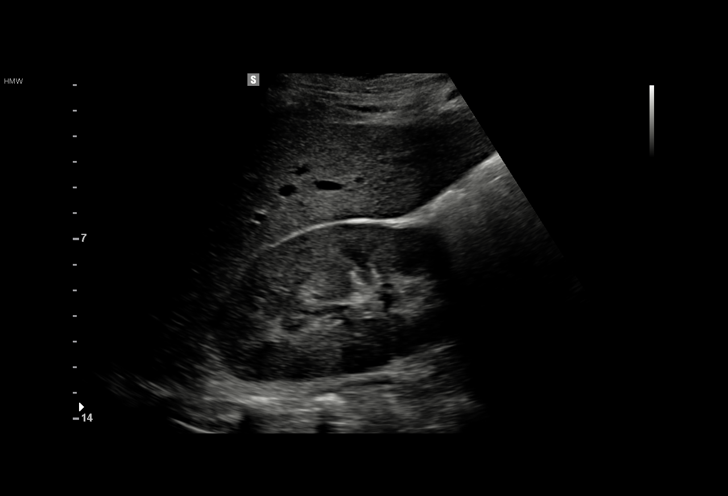
[im 8/43]
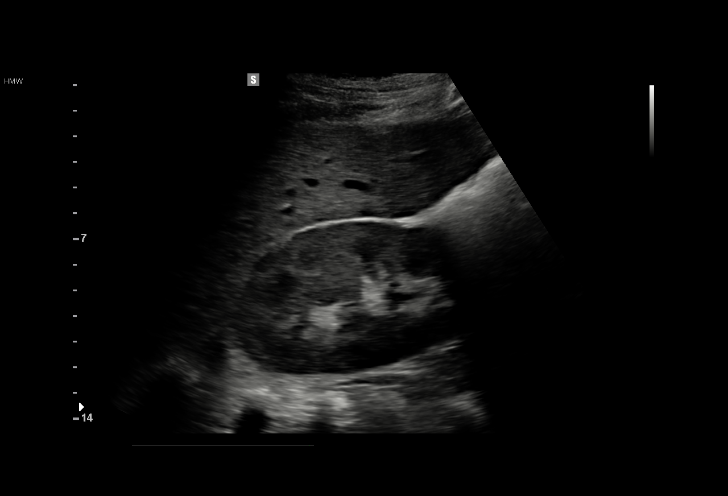
[im 9/43]
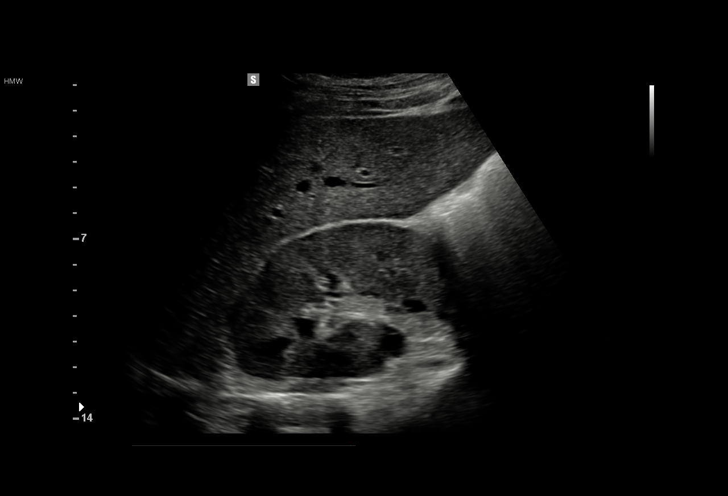
[im 13/43]
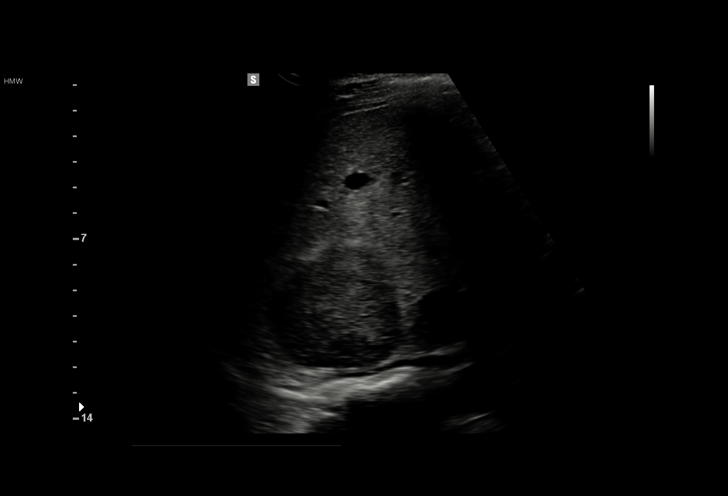
[im 16/43]
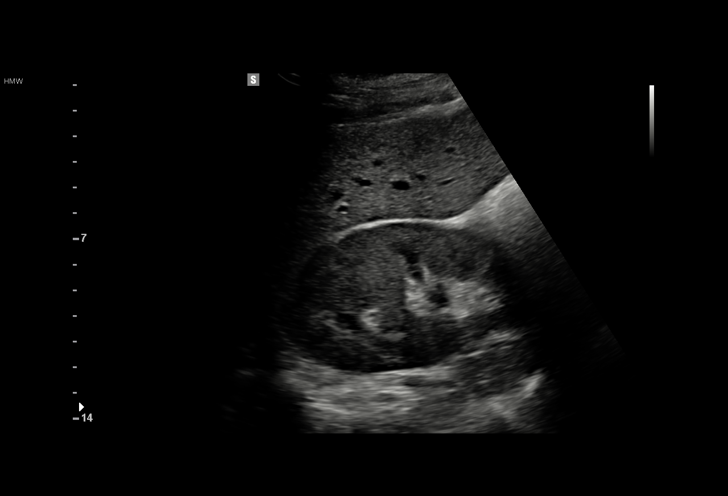
[im 18/43]
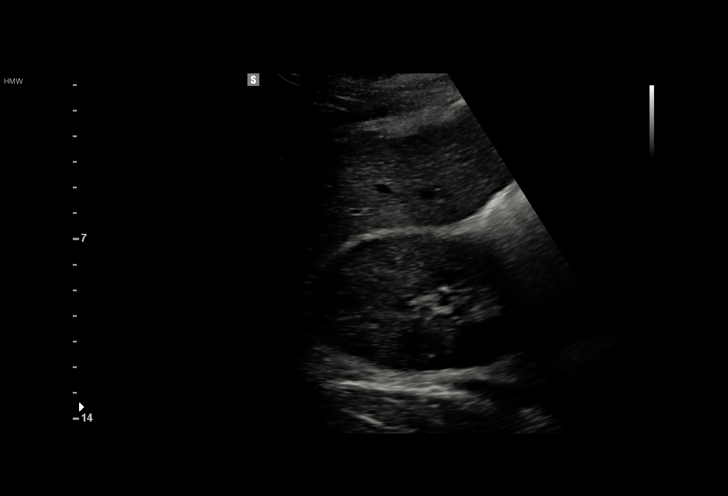
[im 22/43]
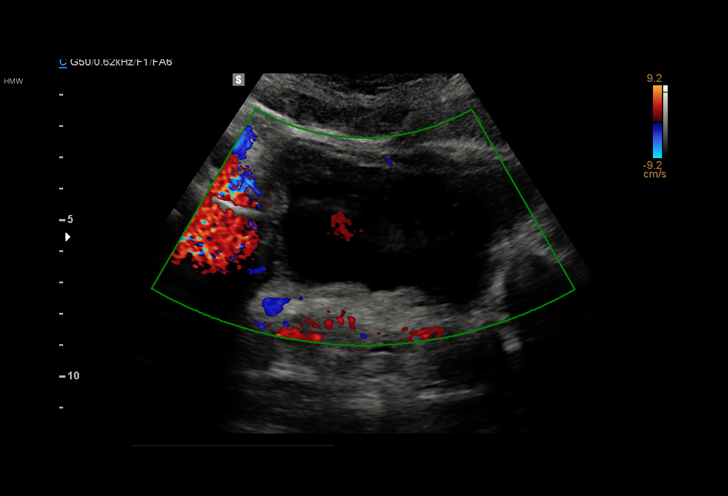
[im 25/43]
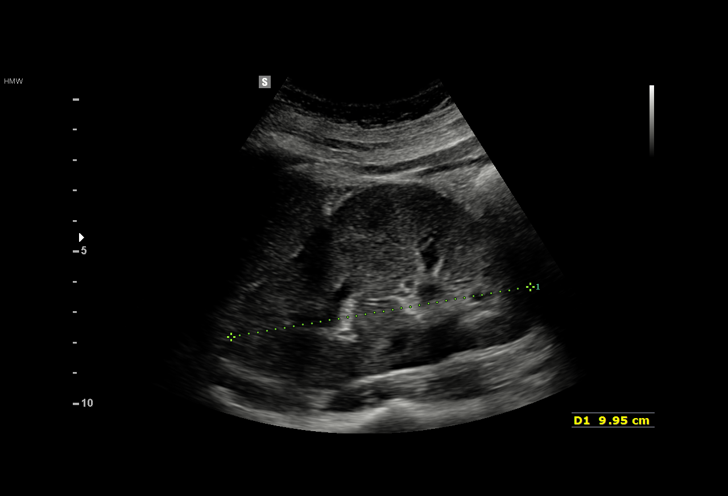
[im 27/43]
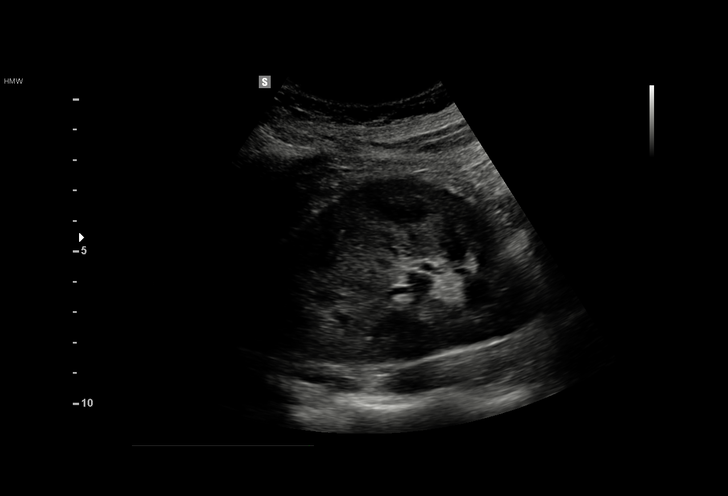
[im 30/43]
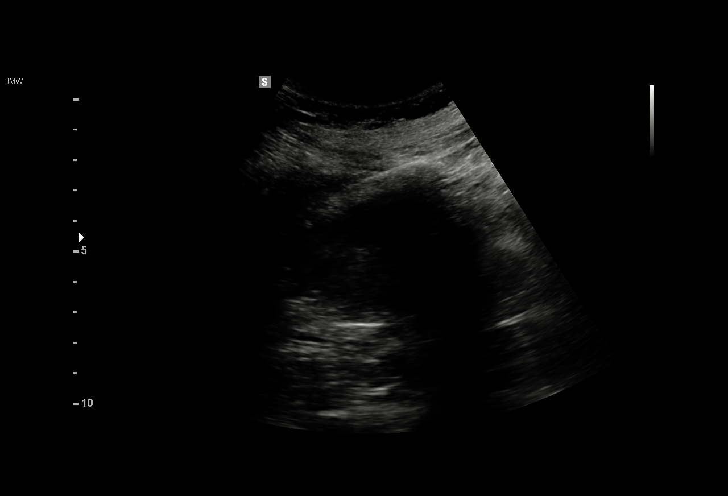
[im 34/43]
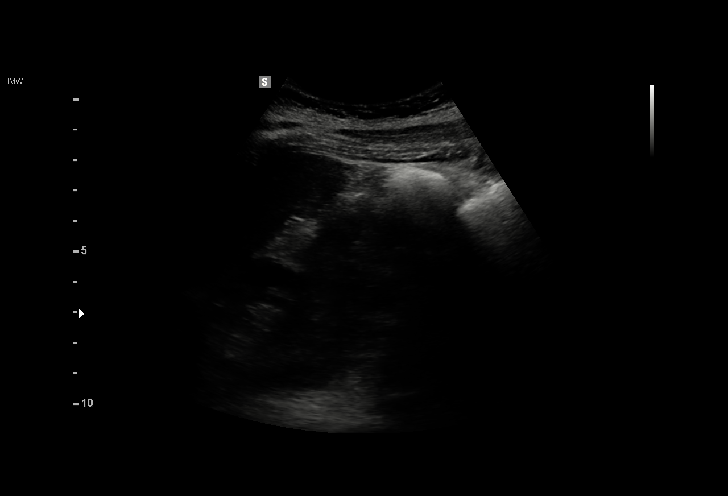
[im 36/43]
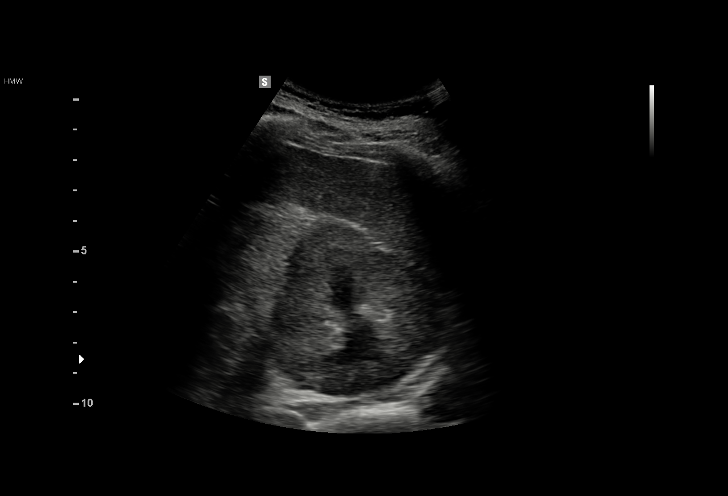
[im 39/43]
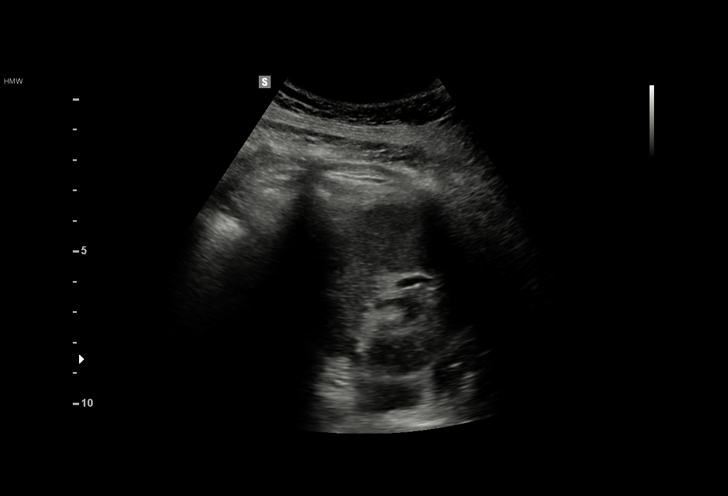
[im 43/43]
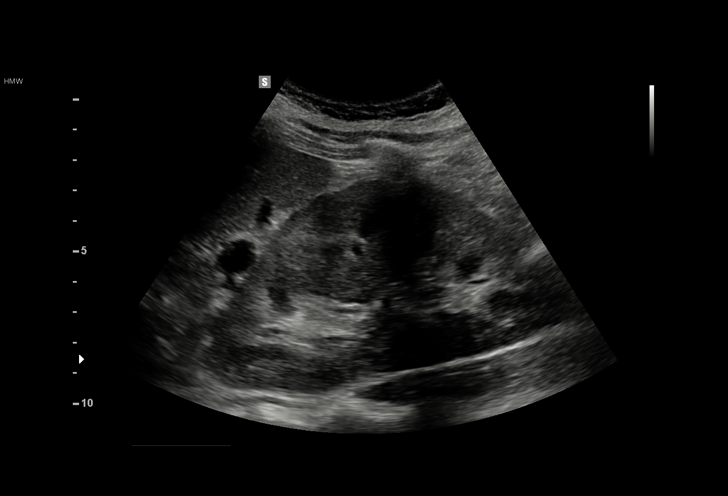

[15 of 25 positions shown; findings below may reference images not displayed]

FINDINGS: Right Kidney:

Length: 9.7 cm. Echogenicity within normal limits. No mass or
hydronephrosis visualized.

Left Kidney:

Length: 10.0 cm. Echogenicity within normal limits. No mass or
hydronephrosis visualized.

Bladder:

Appears normal for degree of bladder distention.
IMPRESSION: Normal kidneys and bladder.  No hydronephrosis.

## 2017-12-08 IMAGING — US US MFM FETAL NUCHAL TRANSLUCENCY
1 series · 15 of 28 positions shown · non-contrast
Comparison: none

[Series 1: us mfm fetal nuchal translucency · 53 acquisitions, 15 frames shown]
[im 1/53]
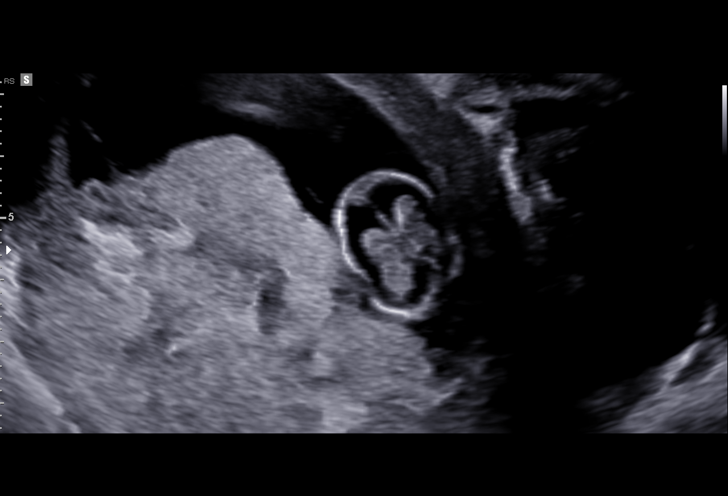
[im 4/53]
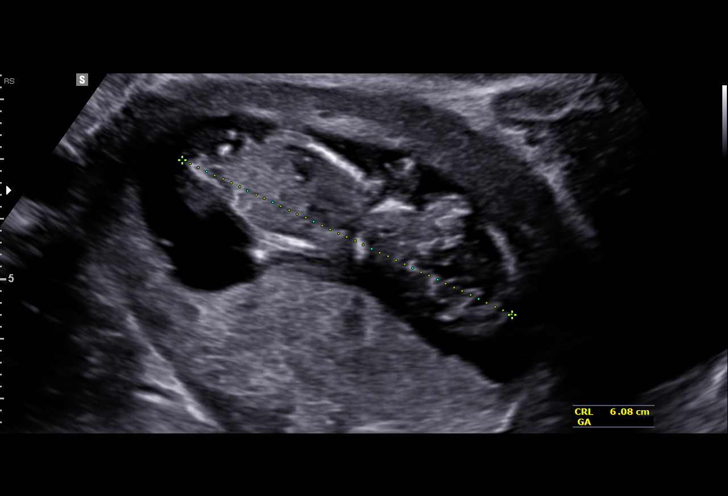
[im 8/53]
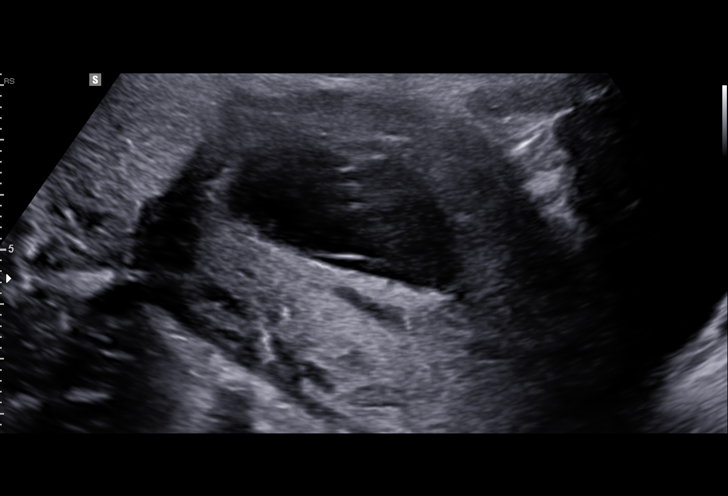
[im 12/53]
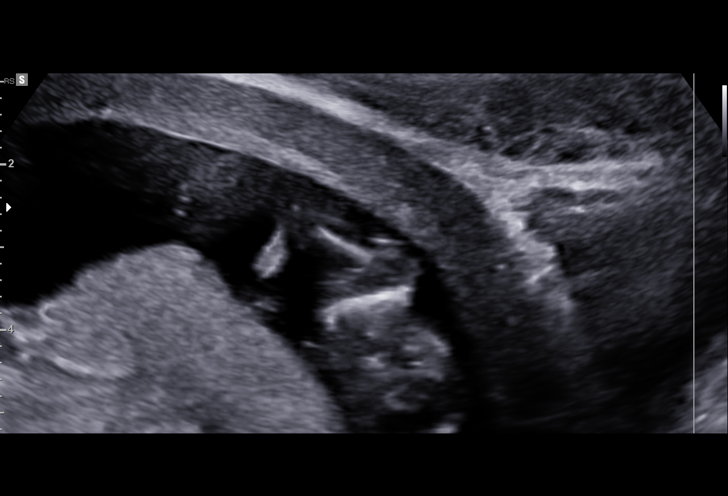
[im 16/53]
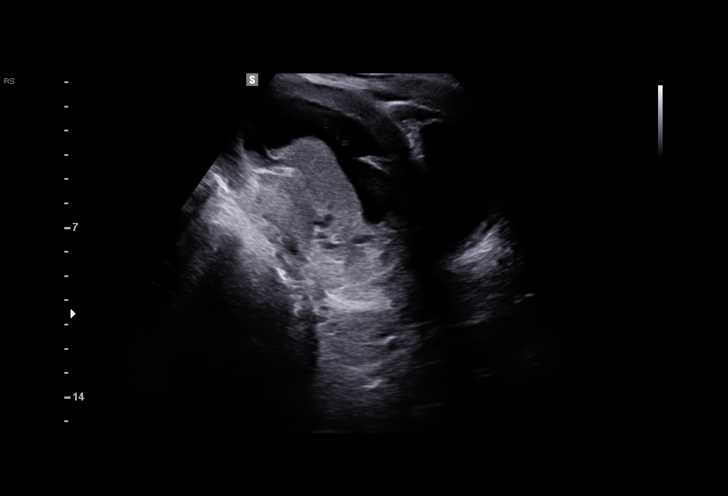
[im 20/53]
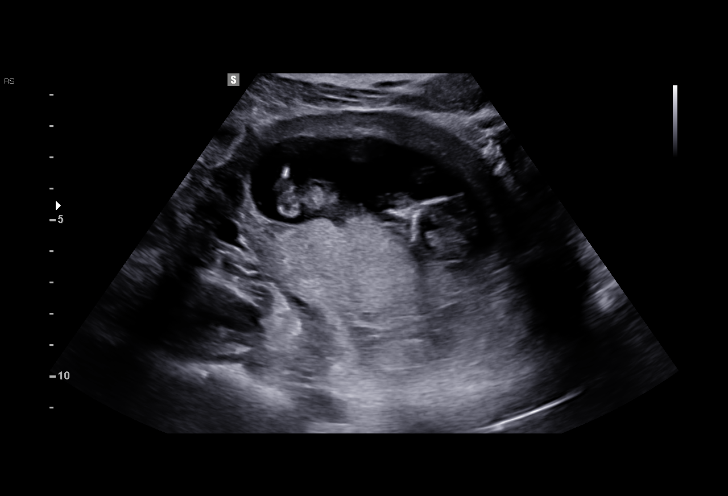
[im 24/53]
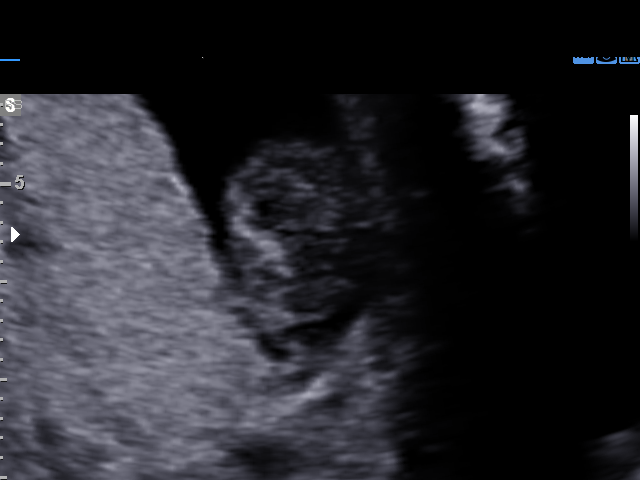
[im 27/53]
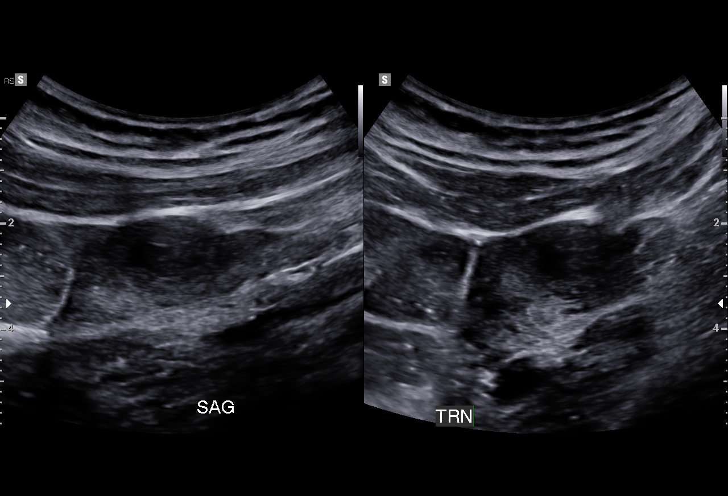
[im 29/53]
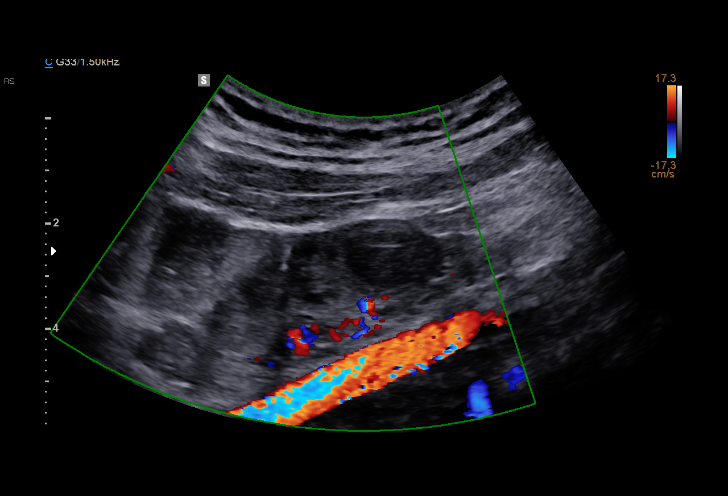
[im 33/53]
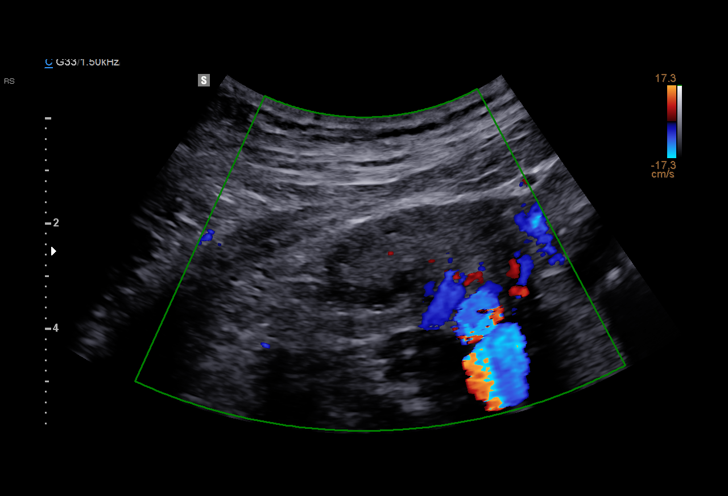
[im 37/53]
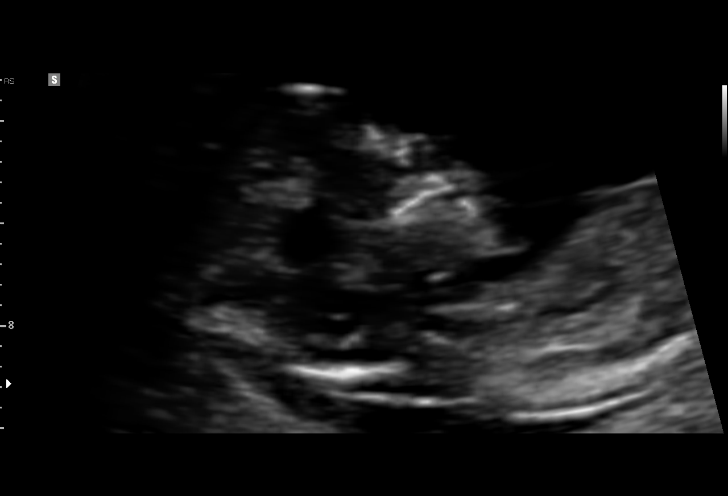
[im 41/53]
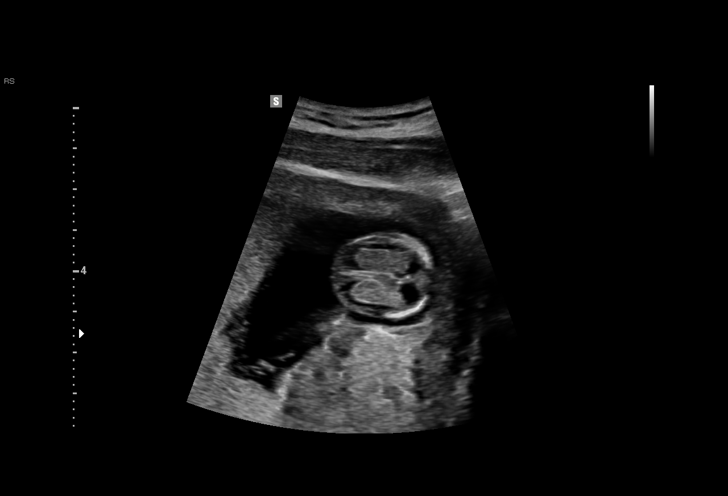
[im 45/53]
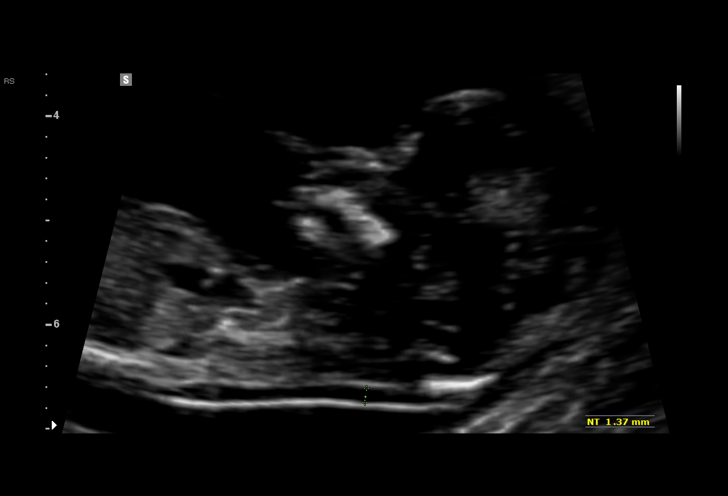
[im 49/53]
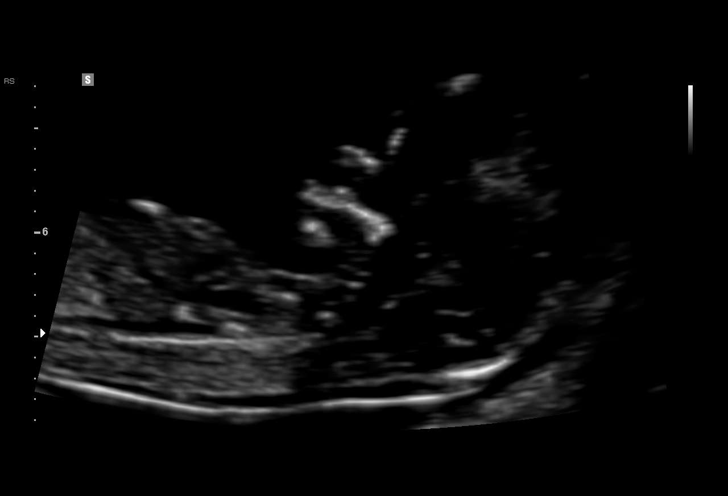
[im 53/53]
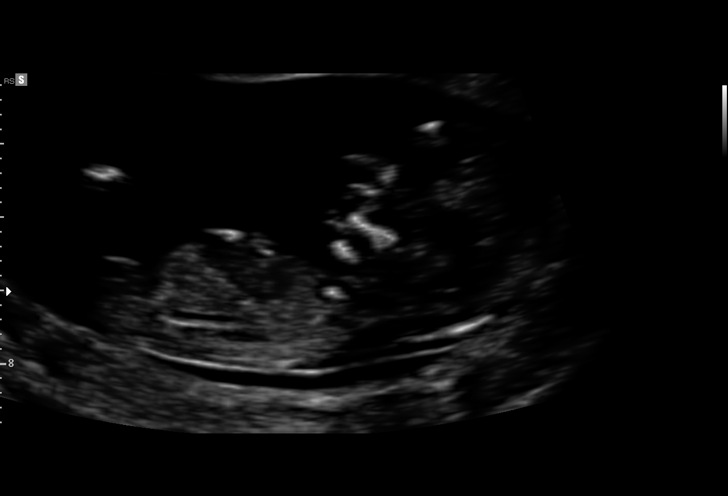

[15 of 28 positions shown; findings below may reference images not displayed]

Road; [HOSPITAL]

TRANSLUCENCY

1  LIO GELB            [id number]      [id number]     [id number]
Indications

12 weeks gestation of pregnancy
First trimester aneuploidy screen (NT)         Z36
Pre-existing diabetes, type 1, in pregnancy,
first trimester
Renal failure
OB History

Gravidity:    1
Fetal Evaluation

Num Of Fetuses:     1
Fetal Heart         162
Rate(bpm):
Cardiac Activity:   Observed
Presentation:       Variable
Biometry

CRL:      63.5  mm     G. Age:  12w 4d                  EDD:   01/14/16
Gestational Age

LMP:           13w 3d       Date:   04/03/15                 EDD:   01/08/16
Best:          12w 5d    Det. By:   Early Ultrasound         EDD:   01/13/16
(06/13/15)
1st Trimester Genetic Sonogram Screening

CRL:            63.5  mm    G. Age:   12w 4d                 EDD:   01/14/16
Nuc Trans:       1.6  mm
Nasal Bone:                 Present
Anatomy

Stomach:               Appears normal, left   Bladder:                Appears normal
sided
Cervix Uterus Adnexa

Uterus
No abnormality visualized.

Left Ovary
Within normal limits.

Right Ovary
Within normal limits.

Adnexa:       No abnormality visualized. No adnexal mass
visualized.
Impression

Single IUP at 12w 5d
Normal NT (1.6 mm).  Nasal bone was visualized.
First trimester aneuploidy screen performed as noted above.
Please do not draw triple/quad screen, though patient should
be offered [facility]SA[facility] for neural tube defect screening.
Recommendations

Recommend ultrasound for fetal anatomy at 18-20 weeks
gestation

## 2018-01-16 ENCOUNTER — Telehealth: Payer: Self-pay | Admitting: *Deleted

## 2018-01-16 NOTE — Telephone Encounter (Signed)
Pt calling to report that she had prenatal care here and had an IUD inserted after.  Pt reports that she is having problems with her IUD and was told by her current gyn to contact this office since it was inserted here.   Pt requesting a call back.

## 2018-01-19 ENCOUNTER — Inpatient Hospital Stay (HOSPITAL_COMMUNITY)
Admission: AD | Admit: 2018-01-19 | Discharge: 2018-01-19 | Disposition: A | Discharge status: Home / Self Care | Payer: Medicare Other | Source: Ambulatory Visit | Attending: Obstetrics and Gynecology | Admitting: Obstetrics and Gynecology

## 2018-01-19 ENCOUNTER — Encounter (HOSPITAL_COMMUNITY): Payer: Self-pay | Admitting: Student

## 2018-01-19 ENCOUNTER — Inpatient Hospital Stay (HOSPITAL_COMMUNITY): Payer: Medicare Other

## 2018-01-19 DIAGNOSIS — N938 Other specified abnormal uterine and vaginal bleeding: Secondary | ICD-10-CM

## 2018-01-19 DIAGNOSIS — Z975 Presence of (intrauterine) contraceptive device: Secondary | ICD-10-CM | POA: Diagnosis not present

## 2018-01-19 DIAGNOSIS — N939 Abnormal uterine and vaginal bleeding, unspecified: Secondary | ICD-10-CM | POA: Insufficient documentation

## 2018-01-19 DIAGNOSIS — D500 Iron deficiency anemia secondary to blood loss (chronic): Secondary | ICD-10-CM | POA: Diagnosis not present

## 2018-01-19 DIAGNOSIS — T8332XA Displacement of intrauterine contraceptive device, initial encounter: Secondary | ICD-10-CM

## 2018-01-19 LAB — POCT PREGNANCY, URINE: Preg Test, Ur: NEGATIVE

## 2018-01-19 LAB — URINALYSIS, ROUTINE W REFLEX MICROSCOPIC

## 2018-01-19 LAB — CBC
HCT: 25.9 % — ABNORMAL LOW (ref 36.0–46.0)
HEMOGLOBIN: 8.9 g/dL — AB (ref 12.0–15.0)
MCH: 31.9 pg (ref 26.0–34.0)
MCHC: 34.4 g/dL (ref 30.0–36.0)
MCV: 92.8 fL (ref 80.0–100.0)
NRBC: 0 % (ref 0.0–0.2)
PLATELETS: 266 10*3/uL (ref 150–400)
RBC: 2.79 MIL/uL — AB (ref 3.87–5.11)
RDW: 13.5 % (ref 11.5–15.5)
WBC: 11.9 10*3/uL — ABNORMAL HIGH (ref 4.0–10.5)

## 2018-01-19 LAB — URINALYSIS, MICROSCOPIC (REFLEX): RBC / HPF: >50 RBC/hpf (ref 0–5)

## 2018-01-19 MED ORDER — MEGESTROL ACETATE 40 MG PO TABS: 40.0000 mg | ORAL_TABLET | Freq: Two times a day (BID) | ORAL | 0 refills | Status: DC

## 2018-01-19 NOTE — MAU Provider Note (Signed)
History     CSN: [id number]  Arrival date and time: 01/19/18 4010   First Provider Initiated Contact with Patient 01/19/18 [phone]      Chief Complaint  Patient presents with  . Vaginal Bleeding   32 y.o. Female here with heavy VB x3 weeks. Bleeding is daily. She is changing super tampons q2-4 hrs and passing quarter sized clots. Having menstrual cramps intermittently. She uses Tylenol and helps. Worse at night. She had Mirena IUD in place since 12/2015.   Past Medical History:  Diagnosis Date  . Anemia   . Chronic kidney disease    Pt is on dialysis  . Depression   . Diabetes mellitus    diagnosed at age 61; Type 1  . Diabetic retinopathy ([place])   . GERD (gastroesophageal reflux disease)   . Headache   . Hypertension   . Sickle cell trait [facility])     Past Surgical History:  Procedure Laterality Date  . AV FISTULA PLACEMENT Right 10/10/2016   Procedure: CREATION OF RIGHT ARM RADIOCEPHALIC ARTERIOVENOUS (AV) FISTULA;  Surgeon: Serafina Mitchell, MD;  Location: [place];  Service: Vascular;  Laterality: Right;  . CESAREAN SECTION N/A 10/19/2015   Procedure: CESAREAN SECTION;  Surgeon: Chancy Milroy, MD;  Location: [place];  Service: Obstetrics;  Laterality: N/A;  . lipo suction  2015  . TONSILLECTOMY AND ADENOIDECTOMY      Family History  Problem Relation Age of Onset  . Cancer Maternal Grandmother        colon and breast  . Hypertension Maternal Grandmother   . Breast cancer Maternal Grandmother   . Diabetes Maternal Grandfather   . Hypertension Maternal Grandfather   . Diabetes Mother   . Hypertension Mother   . Diabetes Father   . Hypertension Father   . Sickle cell trait Father   . Diabetes Paternal Grandmother   . Hypertension Paternal Grandmother   . Diabetes Paternal Grandfather   . Hypertension Paternal Grandfather     Social History   Tobacco Use  . Smoking status: Never Smoker  . Smokeless tobacco: Never Used  Substance Use Topics  . Alcohol  use: Yes    Comment: very rarely  . Drug use: No    Allergies:  Allergies  Allergen Reactions  . Ivp Dye [Iodinated Diagnostic Agents] Other (See Comments)    Burning   . Propranolol Hcl Er Swelling  . Iodine Rash  . Nickel Rash    Medications Prior to Admission  Medication Sig Dispense Refill Last Dose  . ACCU-CHEK FASTCLIX LANCETS MISC As directed 100 each 0 Taking  . amLODipine (NORVASC) 10 MG tablet TAKE ONE TABLET BY MOUTH ONCE DAILY 30 tablet 0 Taking  . Blood Glucose Monitoring Suppl (ACCU-CHEK AVIVA PLUS) w/Device KIT Use to check blood sugars 5 times daily (Patient taking differently: 1 each by Other route 4 (four) times daily. Use to check blood sugars 3-4 times daily) 1 kit 1 Taking  . buPROPion (WELLBUTRIN XL) 150 MG 24 hr tablet Take 150 mg daily by mouth.   Taking  . Fremanezumab-vfrm (AJOVY) 225 MG/1.5ML SOSY Inject 225 mg into the skin every 30 (thirty) days. 1 Syringe 11 Taking  . glucagon (GLUCAGON EMERGENCY) 1 MG injection Inject 1 mg into the skin once as needed for up to 1 dose. Inject 26m into skin once as needed for low blood sugar. 1 each 12 Taking  . glucose blood (ACCU-CHEK AVIVA PLUS) test strip Use to check blood sugars four  times daily 150 each 3 Taking  . insulin aspart (NOVOLOG) 100 UNIT/ML injection Inject 0-20 Units into the skin 3 (three) times daily before meals. Sliding scale 10 mL 3 Taking  . LEVEMIR FLEXTOUCH 100 UNIT/ML Pen INJECT 10 UNITS INTO THE SKIN IN THE MORNING AND INJECT 11 UNITS INTO THE SKIN IN THE EVENING. 3 pen 3 Taking  . losartan (COZAAR) 25 MG tablet Take 25 mg by mouth at bedtime.    Taking  . nortriptyline (PAMELOR) 10 MG capsule Take 2 capsules (20 mg total) by mouth at bedtime. 60 capsule 11 Taking  . ondansetron (ZOFRAN ODT) 4 MG disintegrating tablet Take 1 tablet (4 mg total) by mouth every 8 (eight) hours as needed. 20 tablet 6 Taking  . rizatriptan (MAXALT-MLT) 5 MG disintegrating tablet Take 1 tablet (5 mg total) by mouth  as needed. May repeat in 2 hours if needed 15 tablet 12 Taking  . sevelamer (RENAGEL) 800 MG tablet TAKE 1 TABLET BY MOUTH THREE TIMES A DAY WITH MEALS  12 Taking    Review of Systems  Constitutional: Negative for fever.  Gastrointestinal: Negative for abdominal pain.  Genitourinary: Positive for vaginal bleeding. Negative for dysuria.  Neurological: Negative for dizziness, syncope and light-headedness.   Physical Exam   Blood pressure (!) 166/94, pulse 99, temperature 97.9 F (36.6 C), resp. rate 16, height '5\' 5"'  (1.651 m), weight 30.8 kg, last menstrual period 01/03/2018.  Physical Exam  Constitutional: She is oriented to person, place, and time. She appears well-developed and well-nourished. No distress.  HENT:  Head: Normocephalic and atraumatic.  Neck: Normal range of motion.  Respiratory: Effort normal. No respiratory distress.  Genitourinary:  Genitourinary Comments: External: no lesions or erythema Vagina: rugated, pink, moist, small amt bloody discharge Uterus: non enlarged, anteverted, non tender, no CMT Adnexae: no masses, no tenderness left, no tenderness right Cervix nml, no strings seen   Musculoskeletal: Normal range of motion.  Neurological: She is alert and oriented to person, place, and time.  Skin: Skin is warm and dry.  Psychiatric: She has a normal mood and affect.   Results for orders placed or performed during the hospital encounter of 01/19/18 (from the past 24 hour(s))  Urinalysis, Routine w reflex microscopic     Status: Abnormal   Collection Time: 01/19/18  8:28 AM  Result Value Ref Range   Color, Urine RED (A) YELLOW   APPearance HAZY (A) CLEAR   Specific Gravity, Urine  1.005 - 1.030    TEST NOT REPORTED DUE TO COLOR INTERFERENCE OF URINE PIGMENT   pH  5.0 - 8.0    TEST NOT REPORTED DUE TO COLOR INTERFERENCE OF URINE PIGMENT   Glucose, UA (A) NEGATIVE mg/dL    TEST NOT REPORTED DUE TO COLOR INTERFERENCE OF URINE PIGMENT   Hgb urine dipstick  (A) NEGATIVE    TEST NOT REPORTED DUE TO COLOR INTERFERENCE OF URINE PIGMENT   Bilirubin Urine (A) NEGATIVE    TEST NOT REPORTED DUE TO COLOR INTERFERENCE OF URINE PIGMENT   Ketones, ur (A) NEGATIVE mg/dL    TEST NOT REPORTED DUE TO COLOR INTERFERENCE OF URINE PIGMENT   Protein, ur (A) NEGATIVE mg/dL    TEST NOT REPORTED DUE TO COLOR INTERFERENCE OF URINE PIGMENT   Nitrite (A) NEGATIVE    TEST NOT REPORTED DUE TO COLOR INTERFERENCE OF URINE PIGMENT   Leukocytes, UA (A) NEGATIVE    TEST NOT REPORTED DUE TO COLOR INTERFERENCE OF URINE PIGMENT  Urinalysis, Microscopic (reflex)  Status: Abnormal   Collection Time: 01/19/18  8:28 AM  Result Value Ref Range   RBC / HPF >50 0 - 5 RBC/hpf   WBC, UA 0-5 0 - 5 WBC/hpf   Bacteria, UA RARE (A) NONE SEEN   Squamous Epithelial / LPF 0-5 0 - 5  Pregnancy, urine POC     Status: None   Collection Time: 01/19/18  8:30 AM  Result Value Ref Range   Preg Test, Ur NEGATIVE NEGATIVE  CBC     Status: Abnormal   Collection Time: 01/19/18  8:38 AM  Result Value Ref Range   WBC 11.9 (H) 4.0 - 10.5 K/uL   RBC 2.79 (L) 3.87 - 5.11 MIL/uL   Hemoglobin 8.9 (L) 12.0 - 15.0 g/dL   HCT 25.9 (L) 36.0 - 46.0 %   MCV 92.8 80.0 - 100.0 fL   MCH 31.9 26.0 - 34.0 pg   MCHC 34.4 30.0 - 36.0 g/dL   RDW 13.5 11.5 - 15.5 %   Platelets 266 150 - 400 K/uL   nRBC 0.0 0.0 - 0.2 %   US Pelvic Complete With Transvaginal  Result Date: 01/19/2018 CLINICAL DATA:  Vaginal bleeding.  IUD string lost EXAM: TRANSABDOMINAL AND TRANSVAGINAL ULTRASOUND OF PELVIS TECHNIQUE: Both transabdominal and transvaginal ultrasound examinations of the pelvis were performed. Transabdominal technique was performed for global imaging of the pelvis including uterus, ovaries, adnexal regions, and pelvic cul-de-sac. It was necessary to proceed with endovaginal exam following the transabdominal exam to visualize the uterus, endometrium, ovaries and adnexa. COMPARISON:  None FINDINGS: Uterus  Measurements: 8.2 x 4.7 x 5.7 cm = volume: 113 mL. No fibroids or other mass visualized. Endometrium Thickness: 2 mm in thickness.  No IUD visualized. Right ovary Measurements: 3.2 x 1.8 x 3.5 cm = volume: 10.4 mL. Normal appearance/no adnexal mass. Left ovary Measurements: 3.4 x 2.6 x 3.4 cm = volume: 15.3 mL. Normal appearance/no adnexal mass. Other findings No abnormal free fluid. IMPRESSION: IUD not visualized within the endometrium.  No acute findings. Electronically Signed   By: Rolm Baptise M.D.   On: 01/19/2018 10:20   MAU Course  Procedures  MDM Labs and [place] ordered and reviewed. No evidence of pregnancy. [place] normal except NO IUD seen. Discussed findings with pt. She reports strings were seen at last annual visit in June. Will start Megace for VB. I recommend she wait until she can get effective BC before resuming IC considering she is waiting for kidney transplant. Increase Fe rich foods and Fe supplement for anemia. Stable for discharge home.   Assessment and Plan   1. Abnormal uterine bleeding (AUB)   2. Vagina bleeding   3. IUD strings lost   4. Anemia, blood loss    Discharge home Follow up with [facility] in 2 weeks Rx Megace Bleeding precautions  Allergies as of 01/19/2018      Reactions   Ivp Dye [iodinated Diagnostic Agents] Other (See Comments)   Burning   Propranolol Hcl Er Swelling   Iodine Rash   Nickel Rash      Medication List    STOP taking these medications   ondansetron 4 MG disintegrating tablet Commonly known as:  ZOFRAN-ODT     TAKE these medications   ACCU-CHEK AVIVA PLUS w/Device Kit Use to check blood sugars 5 times daily What changed:    how much to take  how to take this  when to take this  additional instructions   ACCU-CHEK FASTCLIX LANCETS Misc  As directed   amLODipine 10 MG tablet Commonly known as:  NORVASC TAKE ONE TABLET BY MOUTH ONCE DAILY   buPROPion 150 MG 24 hr tablet Commonly known as:  WELLBUTRIN XL Take  150 mg daily by mouth.   CARTIA XT 120 MG 24 hr capsule Generic drug:  diltiazem Take 120 mg by mouth at bedtime.   Fremanezumab-vfrm 225 MG/1.5ML Sosy Inject 225 mg into the skin every 30 (thirty) days.   glucagon 1 MG injection Inject 1 mg into the skin once as needed for up to 1 dose. Inject 84m into skin once as needed for low blood sugar.   glucose blood test strip Use to check blood sugars four times daily   insulin aspart 100 UNIT/ML injection Commonly known as:  novoLOG Inject 0-20 Units into the skin 3 (three) times daily before meals. Sliding scale What changed:  how much to take   LEVEMIR FLEXTOUCH 100 UNIT/ML Pen Generic drug:  Insulin Detemir INJECT 10 UNITS INTO THE SKIN IN THE MORNING AND INJECT 11 UNITS INTO THE SKIN IN THE EVENING. What changed:  See the new instructions.   losartan 25 MG tablet Commonly known as:  COZAAR Take 50 mg by mouth at bedtime.   megestrol 40 MG tablet Commonly known as:  MEGACE Take 1 tablet (40 mg total) by mouth 2 (two) times daily.   nortriptyline 10 MG capsule Commonly known as:  PAMELOR Take 2 capsules (20 mg total) by mouth at bedtime.   rizatriptan 5 MG disintegrating tablet Commonly known as:  MAXALT-MLT Take 1 tablet (5 mg total) by mouth as needed. May repeat in 2 hours if needed   sevelamer 800 MG tablet Commonly known as:  RENAGEL Take 2,400 mg by mouth 3 (three) times daily with meals. 3 tablets with each meal and snacks      MJulianne Handler CNM 01/19/2018, 8:52 AM

## 2018-01-19 NOTE — MAU Note (Signed)
Pt presents to MAU with complaints of heavy vaginal bleeding that started 3 weeks. Lower abdominal cramping. States she does have and IUD in place

## 2018-01-19 NOTE — Discharge Instructions (Signed)

## 2018-01-20 NOTE — Telephone Encounter (Signed)
Called pt to see if she needed to be seen for her IUD, Pt stated that she went to the ER Yesterday and the IUD "fell out". Advised pt that if she needed anything else to give our office a call. Pt verbalized understanding and had no questions.

## 2018-01-29 ENCOUNTER — Other Ambulatory Visit: Payer: Self-pay | Admitting: Obstetrics and Gynecology

## 2018-01-29 DIAGNOSIS — N6310 Unspecified lump in the right breast, unspecified quadrant: Secondary | ICD-10-CM

## 2018-02-05 ENCOUNTER — Other Ambulatory Visit: Payer: Self-pay | Admitting: Obstetrics and Gynecology

## 2018-02-05 ENCOUNTER — Ambulatory Visit
Admission: RE | Admit: 2018-02-05 | Discharge: 2018-02-05 | Disposition: A | Discharge status: Home / Self Care | Payer: Medicare Other | Source: Ambulatory Visit | Attending: Obstetrics and Gynecology | Admitting: Obstetrics and Gynecology

## 2018-02-05 DIAGNOSIS — N6310 Unspecified lump in the right breast, unspecified quadrant: Secondary | ICD-10-CM

## 2018-02-05 DIAGNOSIS — R2231 Localized swelling, mass and lump, right upper limb: Secondary | ICD-10-CM

## 2018-02-10 ENCOUNTER — Other Ambulatory Visit: Payer: Medicare Other

## 2018-02-17 ENCOUNTER — Ambulatory Visit
Admission: RE | Admit: 2018-02-17 | Discharge: 2018-02-17 | Disposition: A | Discharge status: Home / Self Care | Payer: Medicare Other | Source: Ambulatory Visit | Attending: Obstetrics and Gynecology | Admitting: Obstetrics and Gynecology

## 2018-02-17 DIAGNOSIS — N6310 Unspecified lump in the right breast, unspecified quadrant: Secondary | ICD-10-CM

## 2018-02-17 DIAGNOSIS — R2231 Localized swelling, mass and lump, right upper limb: Secondary | ICD-10-CM

## 2018-02-25 ENCOUNTER — Encounter: Admit: 2018-02-25 | Discharge: 2018-02-25 | Payer: MEDICARE | Attending: Nephrology | Primary: Nephrology

## 2018-03-04 DIAGNOSIS — Z01818 Encounter for other preprocedural examination: Secondary | ICD-10-CM

## 2018-03-04 DIAGNOSIS — Z7682 Awaiting organ transplant status: Principal | ICD-10-CM

## 2018-03-04 DIAGNOSIS — N186 End stage renal disease: Secondary | ICD-10-CM

## 2018-03-23 ENCOUNTER — Ambulatory Visit: Payer: Medicare Other | Admitting: Neurology

## 2018-03-26 ENCOUNTER — Ambulatory Visit: Admit: 2018-03-26 | Discharge: 2018-04-08 | Payer: MEDICARE

## 2018-03-26 ENCOUNTER — Ambulatory Visit: Admit: 2018-03-26 | Discharge: 2018-03-26 | Payer: MEDICARE

## 2018-03-26 ENCOUNTER — Ambulatory Visit: Admit: 2018-03-26 | Discharge: 2018-03-27 | Payer: MEDICARE

## 2018-03-26 ENCOUNTER — Other Ambulatory Visit: Admit: 2018-03-26 | Discharge: 2018-03-26 | Payer: MEDICARE

## 2018-03-26 ENCOUNTER — Ambulatory Visit: Admit: 2018-03-26 | Discharge: 2018-04-24 | Payer: MEDICARE

## 2018-03-26 ENCOUNTER — Institutional Professional Consult (permissible substitution): Admit: 2018-03-26 | Discharge: 2018-03-26 | Payer: MEDICARE

## 2018-03-26 DIAGNOSIS — Z7682 Awaiting organ transplant status: Principal | ICD-10-CM

## 2018-03-26 DIAGNOSIS — N186 End stage renal disease: Secondary | ICD-10-CM

## 2018-03-26 DIAGNOSIS — Z01810 Encounter for preprocedural cardiovascular examination: Secondary | ICD-10-CM

## 2018-03-26 DIAGNOSIS — Z992 Dependence on renal dialysis: Secondary | ICD-10-CM

## 2018-03-26 DIAGNOSIS — Z01818 Encounter for other preprocedural examination: Secondary | ICD-10-CM

## 2018-03-26 DIAGNOSIS — E1022 Type 1 diabetes mellitus with diabetic chronic kidney disease: Secondary | ICD-10-CM

## 2018-04-06 MED ORDER — METOPROLOL SUCCINATE ER 50 MG TABLET,EXTENDED RELEASE 24 HR
ORAL_TABLET | Freq: Every day | ORAL | 2 refills | 0.00000 days | Status: CP
Start: 2018-04-06 — End: 2018-07-08

## 2018-04-23 DIAGNOSIS — Z7682 Awaiting organ transplant status: Principal | ICD-10-CM

## 2018-04-23 DIAGNOSIS — N186 End stage renal disease: Principal | ICD-10-CM

## 2018-04-23 DIAGNOSIS — Z01818 Encounter for other preprocedural examination: Principal | ICD-10-CM

## 2018-04-29 ENCOUNTER — Telehealth: Payer: Self-pay | Admitting: Neurology

## 2018-04-29 NOTE — Telephone Encounter (Signed)
Fail to reach her

## 2018-04-30 ENCOUNTER — Ambulatory Visit: Payer: Medicare Other | Admitting: Neurology

## 2018-05-06 ENCOUNTER — Encounter: Admit: 2018-05-06 | Discharge: 2018-05-06 | Payer: MEDICARE | Attending: Nephrology | Primary: Nephrology

## 2018-05-12 DIAGNOSIS — Z7682 Awaiting organ transplant status: Principal | ICD-10-CM

## 2018-05-12 DIAGNOSIS — N186 End stage renal disease: Principal | ICD-10-CM

## 2018-05-12 DIAGNOSIS — Z01818 Encounter for other preprocedural examination: Principal | ICD-10-CM

## 2018-06-03 ENCOUNTER — Encounter: Admit: 2018-06-03 | Discharge: 2018-06-03 | Payer: MEDICARE | Attending: Nephrology | Primary: Nephrology

## 2018-06-08 DIAGNOSIS — Z01818 Encounter for other preprocedural examination: Secondary | ICD-10-CM

## 2018-06-08 DIAGNOSIS — Z7682 Awaiting organ transplant status: Principal | ICD-10-CM

## 2018-06-08 DIAGNOSIS — N186 End stage renal disease: Secondary | ICD-10-CM

## 2018-06-09 ENCOUNTER — Telehealth: Admit: 2018-06-09 | Discharge: 2018-06-10 | Payer: MEDICARE

## 2018-06-09 DIAGNOSIS — Z992 Dependence on renal dialysis: Secondary | ICD-10-CM

## 2018-06-09 DIAGNOSIS — N186 End stage renal disease: Secondary | ICD-10-CM

## 2018-06-09 DIAGNOSIS — E1022 Type 1 diabetes mellitus with diabetic chronic kidney disease: Principal | ICD-10-CM

## 2018-06-09 DIAGNOSIS — I120 Hypertensive chronic kidney disease with stage 5 chronic kidney disease or end stage renal disease: Secondary | ICD-10-CM

## 2018-06-09 DIAGNOSIS — N185 Chronic kidney disease, stage 5: Secondary | ICD-10-CM

## 2018-06-09 DIAGNOSIS — I429 Cardiomyopathy, unspecified: Secondary | ICD-10-CM

## 2018-07-08 ENCOUNTER — Encounter: Admit: 2018-07-08 | Discharge: 2018-07-08 | Payer: MEDICARE | Attending: Nephrology | Primary: Nephrology

## 2018-07-08 MED ORDER — METOPROLOL SUCCINATE ER 50 MG TABLET,EXTENDED RELEASE 24 HR: tablet | 2 refills | 0 days | Status: AC

## 2018-07-08 MED ORDER — METOPROLOL SUCCINATE ER 50 MG TABLET,EXTENDED RELEASE 24 HR
ORAL_TABLET | Freq: Every day | ORAL | 2 refills | 0.00000 days | Status: CP
Start: 2018-07-08 — End: 2018-10-06

## 2018-07-14 DIAGNOSIS — N186 End stage renal disease: Secondary | ICD-10-CM

## 2018-07-14 DIAGNOSIS — Z7682 Awaiting organ transplant status: Principal | ICD-10-CM

## 2018-07-14 DIAGNOSIS — Z01818 Encounter for other preprocedural examination: Secondary | ICD-10-CM

## 2018-08-04 IMAGING — CR DG CHEST 2V
2 series · 2 of 2 positions shown · non-contrast
Comparison: None.

CLINICAL DATA: X 3 days patient reports cough, fever, diarrhea,
vomiting.

EXAM:
CHEST  2 VIEW

[chest pa]
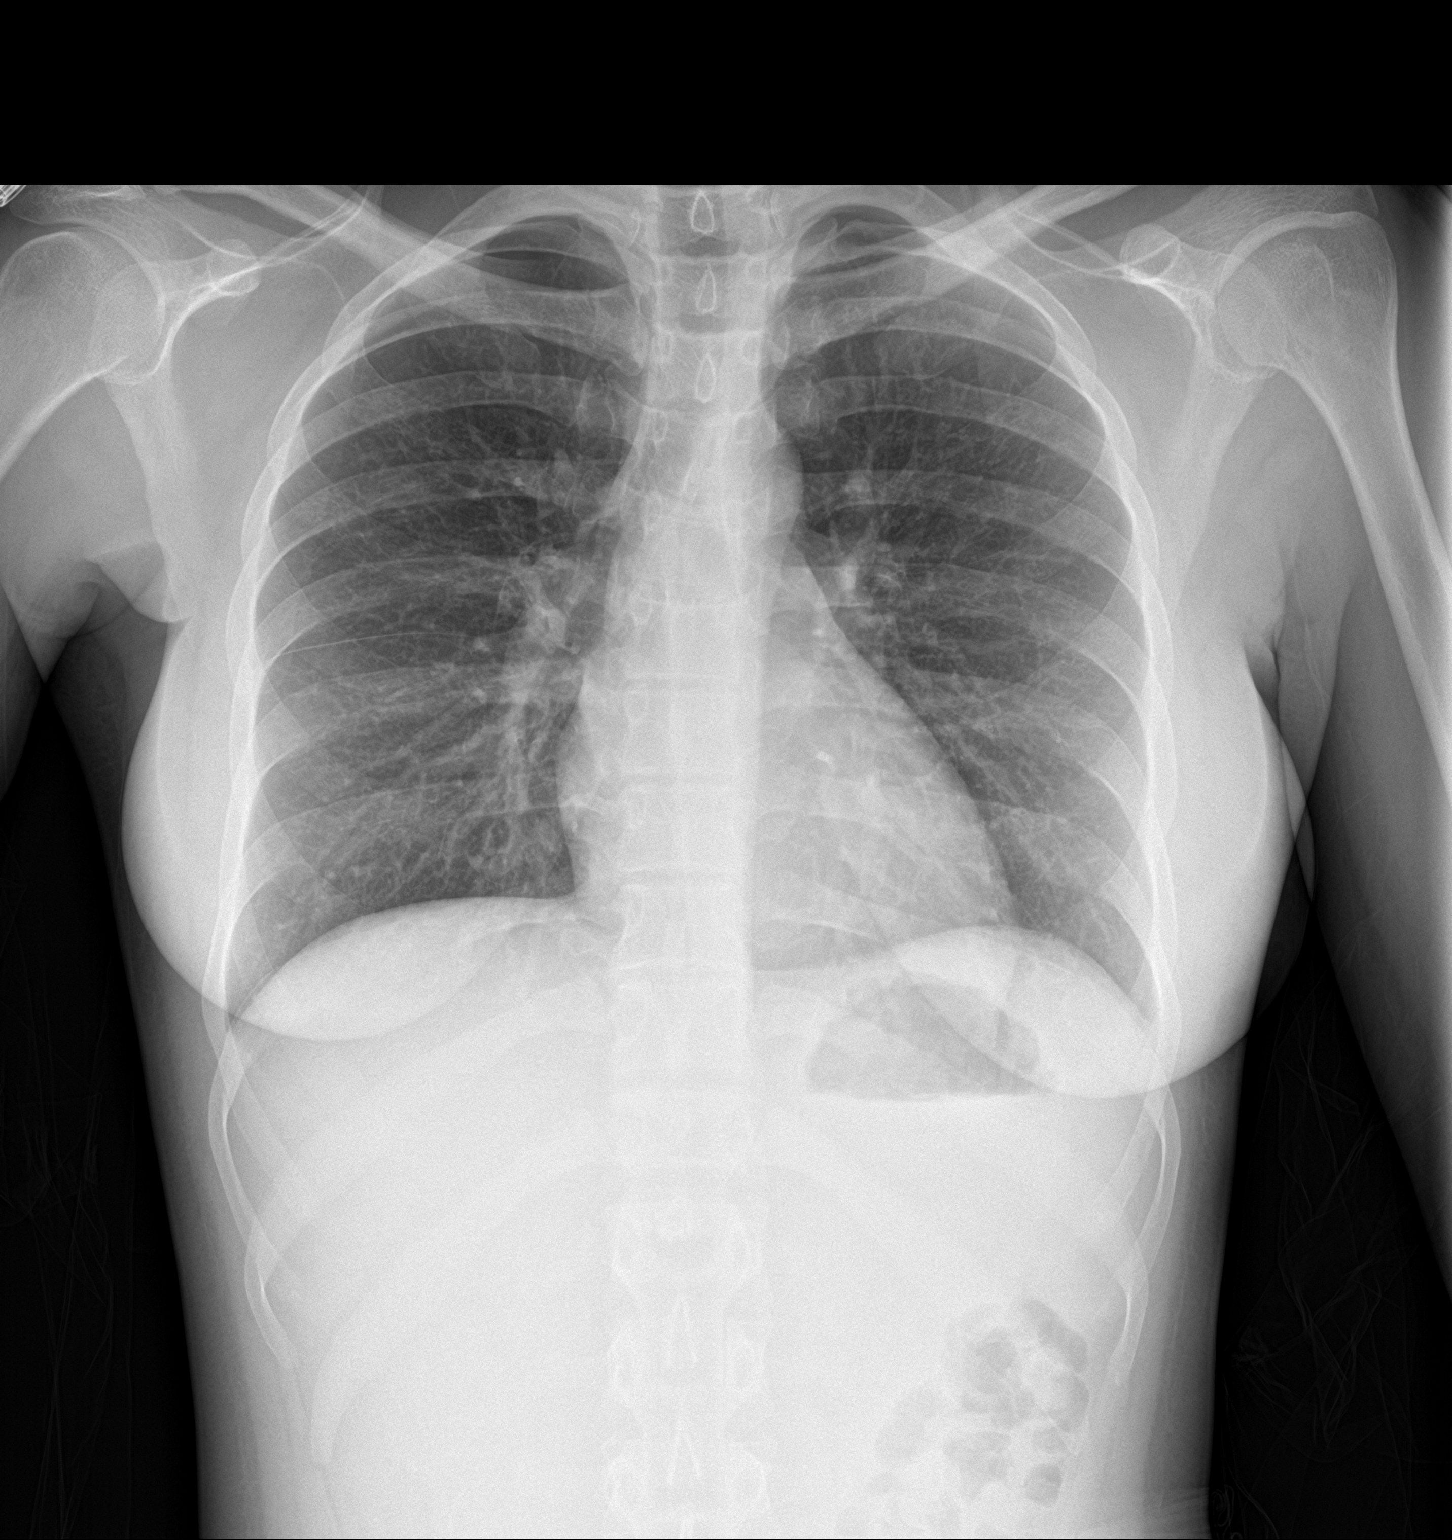

[chest lat]
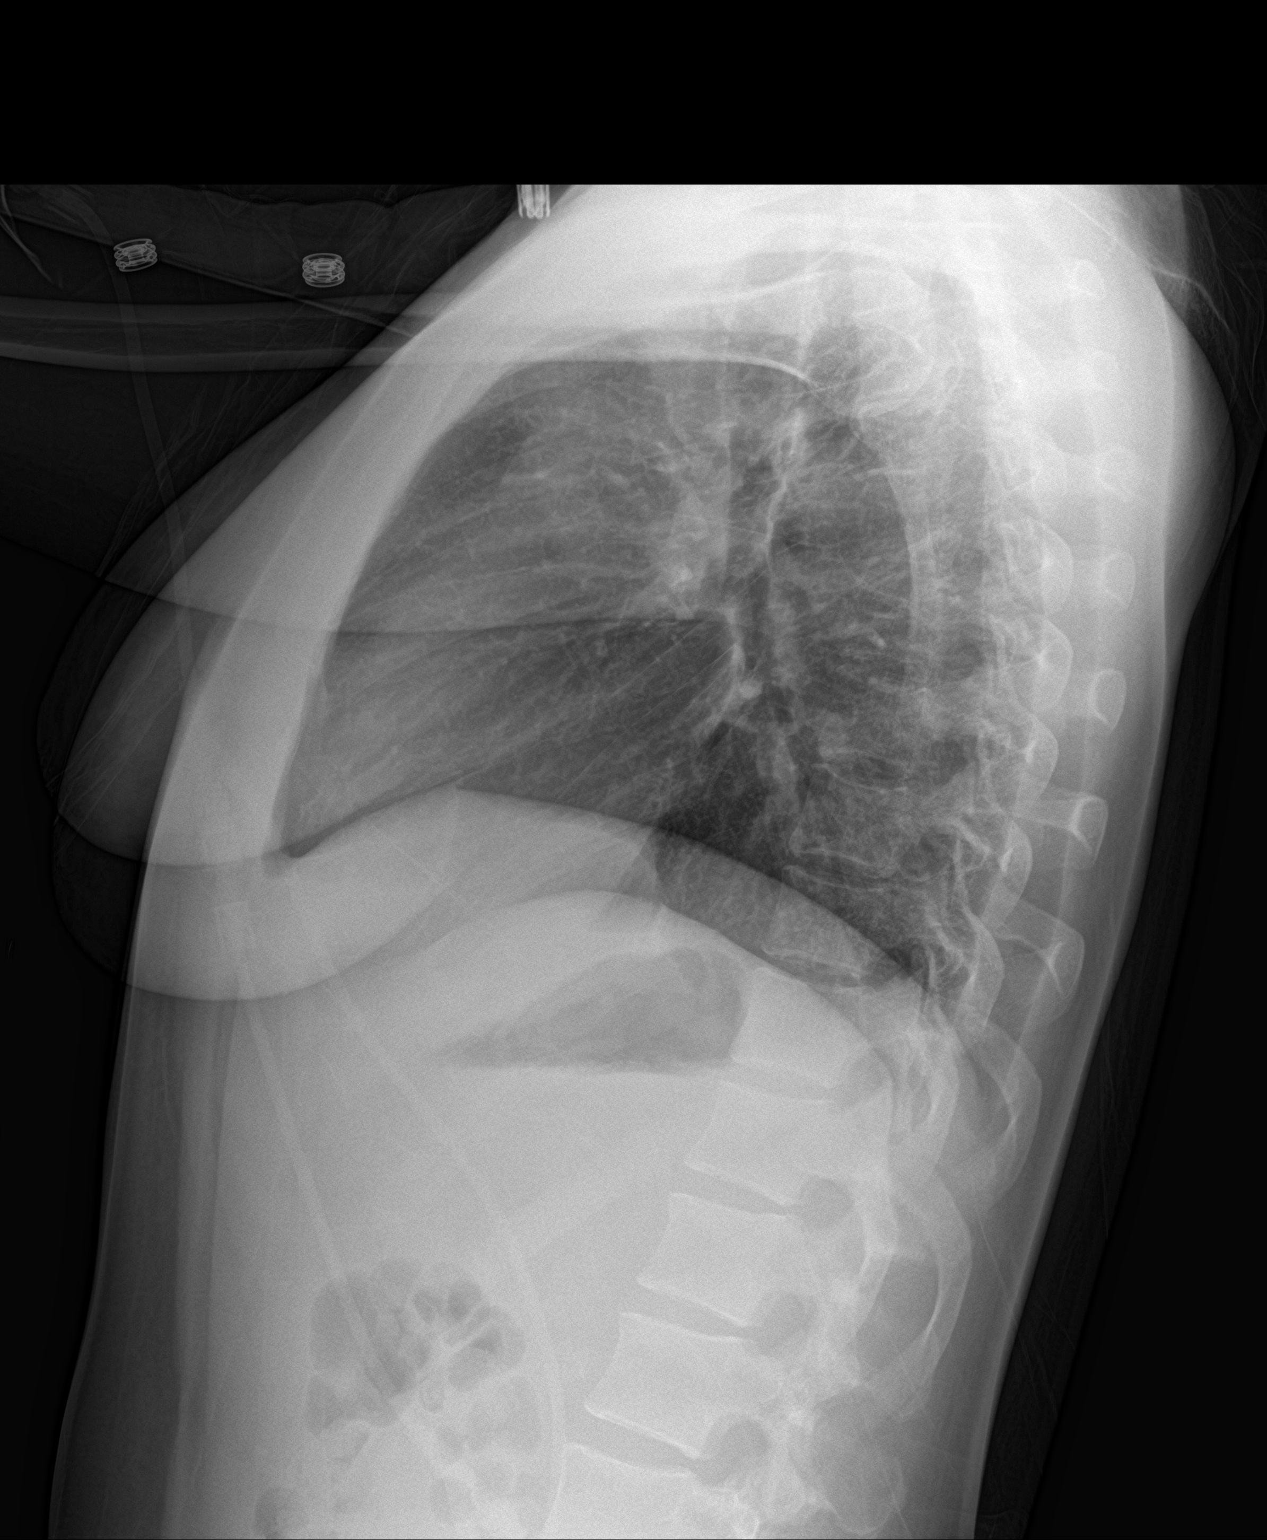

[2 of 2 positions shown; findings below may reference images not displayed]

FINDINGS: Normal mediastinum and cardiac silhouette. Normal pulmonary
vasculature. No evidence of effusion, infiltrate, or pneumothorax.
No acute bony abnormality.
IMPRESSION: No acute cardiopulmonary process.

## 2018-08-18 ENCOUNTER — Ambulatory Visit: Admit: 2018-08-18 | Discharge: 2018-08-18 | Payer: MEDICARE

## 2018-08-18 DIAGNOSIS — Z01810 Encounter for preprocedural cardiovascular examination: Secondary | ICD-10-CM

## 2018-08-18 DIAGNOSIS — I158 Other secondary hypertension: Secondary | ICD-10-CM

## 2018-08-18 DIAGNOSIS — N186 End stage renal disease: Secondary | ICD-10-CM

## 2018-08-18 DIAGNOSIS — I429 Cardiomyopathy, unspecified: Principal | ICD-10-CM

## 2018-08-18 DIAGNOSIS — N185 Chronic kidney disease, stage 5: Secondary | ICD-10-CM

## 2018-08-18 DIAGNOSIS — Z992 Dependence on renal dialysis: Secondary | ICD-10-CM

## 2018-08-18 DIAGNOSIS — E1022 Type 1 diabetes mellitus with diabetic chronic kidney disease: Secondary | ICD-10-CM

## 2018-08-18 DIAGNOSIS — Z7682 Awaiting organ transplant status: Principal | ICD-10-CM

## 2018-08-28 MED ORDER — PREDNISONE 10 MG TABLET
ORAL_TABLET | Freq: Every day | ORAL | 0 refills | 15.00000 days | Status: CP
Start: 2018-08-28 — End: ?

## 2018-09-02 ENCOUNTER — Encounter: Admit: 2018-09-02 | Discharge: 2018-09-02 | Payer: MEDICARE | Attending: Nephrology | Primary: Nephrology

## 2018-09-08 ENCOUNTER — Ambulatory Visit: Payer: Medicare Other

## 2018-09-08 ENCOUNTER — Ambulatory Visit: Payer: Medicare Other | Admitting: Endocrinology

## 2018-09-08 DIAGNOSIS — Z7682 Awaiting organ transplant status: Principal | ICD-10-CM

## 2018-09-08 DIAGNOSIS — Z01818 Encounter for other preprocedural examination: Secondary | ICD-10-CM

## 2018-09-08 DIAGNOSIS — N186 End stage renal disease: Secondary | ICD-10-CM

## 2018-09-09 ENCOUNTER — Other Ambulatory Visit: Payer: Self-pay | Admitting: Endocrinology

## 2018-09-09 ENCOUNTER — Ambulatory Visit: Payer: Medicare Other

## 2018-09-09 ENCOUNTER — Other Ambulatory Visit: Payer: Self-pay

## 2018-09-09 DIAGNOSIS — E1065 Type 1 diabetes mellitus with hyperglycemia: Secondary | ICD-10-CM

## 2018-09-09 LAB — HEMOGLOBIN A1C: Hgb A1c MFr Bld: 7.9 % — ABNORMAL HIGH (ref 4.6–6.5)

## 2018-09-09 LAB — LIPID PANEL
Cholesterol: 143 mg/dL (ref 0–200)
HDL: 66.9 mg/dL (ref >39.00)
LDL Cholesterol: 52 mg/dL (ref 0–99)
NonHDL: 76.36
Total CHOL/HDL Ratio: 2
Triglycerides: 122 mg/dL (ref 0.0–149.0)
VLDL: 24.4 mg/dL (ref 0.0–40.0)

## 2018-09-09 LAB — GLUCOSE, RANDOM: Glucose, Bld: 337 mg/dL — ABNORMAL HIGH (ref 70–99)

## 2018-09-10 ENCOUNTER — Other Ambulatory Visit: Payer: Self-pay

## 2018-09-10 LAB — FRUCTOSAMINE: Fructosamine: 539 umol/L — ABNORMAL HIGH (ref 0–285)

## 2018-09-10 MED ORDER — INSULIN ASPART 100 UNIT/ML ~~LOC~~ SOLN: 0.0000 [IU] | Freq: Three times a day (TID) | SUBCUTANEOUS | 3 refills | Status: AC

## 2018-09-10 MED ORDER — LEVEMIR FLEXTOUCH 100 UNIT/ML ~~LOC~~ SOPN: PEN_INJECTOR | SUBCUTANEOUS | 3 refills | Status: AC

## 2018-09-11 ENCOUNTER — Encounter: Payer: Self-pay | Admitting: Endocrinology

## 2018-09-11 ENCOUNTER — Ambulatory Visit (INDEPENDENT_AMBULATORY_CARE_PROVIDER_SITE_OTHER): Payer: Medicare Other | Admitting: Endocrinology

## 2018-09-11 ENCOUNTER — Other Ambulatory Visit: Payer: Self-pay

## 2018-09-11 DIAGNOSIS — N186 End stage renal disease: Secondary | ICD-10-CM | POA: Diagnosis not present

## 2018-09-11 DIAGNOSIS — Z992 Dependence on renal dialysis: Secondary | ICD-10-CM | POA: Diagnosis not present

## 2018-09-11 DIAGNOSIS — E1065 Type 1 diabetes mellitus with hyperglycemia: Secondary | ICD-10-CM

## 2018-09-11 NOTE — Progress Notes (Signed)
Patient ID: Christine Reed, female   DOB: 08-25-1985, 33 y.o.   MRN: [id number]           Reason for Appointment : Follow-up for Type 1 Diabetes  History of Present Illness          Diagnosis: Type 1 diabetes mellitus, date of diagnosis: 2000         Previous history:   She was initially started on NPH and Regular Insulin and subsequently was on Lantus and Humalog Usually has had poor control but records are not available for the last few years from her previous endocrinologist In April her Lantus was changed Novolin N presumably because of tendency to low sugars during the night  Recent history:   INSULIN regimen is: Novolog at meals carbohydrate coverage 1: 15, correction factor 1 units per 50 mg over 150 Levemir 10-morning and 11 units in evening at bedtime  She has not been seen in follow-up since 05/2017  A1c is 7.9, previously 8.7 However fructosamine is 539 checked because of her being in ESRD  Current management, blood sugar patterns and problems identified:    She is still checking her blood sugars again with the [employer] monitor despite having the test as prescribed for the Accu-Chek meter before  Also is not coming back regularly for appointments as recommended  Currently monitoring is done minimally after meals and mostly in the mornings and home occasionally before dinner  Also his fasting blood sugars are quite variable and recently about half of them are high and half of them are near normal  However she is having HYPOGLYCEMIA overnight by history with recent low sugar of 54 at 2 AM  Blood sugars at suppertime recently very high when checked  Not clear if her sugars are higher after dinner recently but she thinks they are mostly around 200 average  Mealtime insulin can be taken before or after eating and not consistently ahead of time  She thinks he is regular with her Levemir  Hypoglycemia: She has had 2 episodes, both of them related to over correcting her  fasting blood sugar of over 300   Glucose monitoring:  is being done 3-5 times a day         Glucometer:  [employer] brand  Blood Glucose readings from download   PRE-MEAL Fasting Lunch Dinner Bedtime Overall  Glucose range:  99-478   258-397    Mean/median:      238   POST-MEAL PC Breakfast PC Lunch PC Dinner  Glucose range:     Mean/median:       Previous readings:  PRE-MEAL Fasting Lunch  evening Bedtime Overall  Glucose range:  70-500+      Mean/median:  166   190  339  245+/-165    Factors causing hypoglycemia: Excessive insulin for high sugars, occasionally overnight, Symptoms of hypoglycemia:Weakness Treatment of hypoglycemia: As above          Self-care: The diet that the patient has been following is: Carbohydrate counting Usually eating cream of wheat at breakfast, mostly salad at lunch and dinner is usually at 8-10 PM  Snacks usually are apple or crackers          Exercise: none          Dietician consultation: Most recent: 5/17 in the hospital .          CDE consultation:?  Wt Readings from Last 3 Encounters:  01/19/18 68 lb (30.8 kg)  11/18/17 154 lb (69.9  kg)  09/18/17 145 lb 8 oz (66 kg)    Diabetes labs:  Lab Results  Component Value Date   HGBA1C 7.9 (H) 09/09/2018   HGBA1C 8.7 11/29/2016   HGBA1C 7.8 (H) 09/04/2016   Lab Results  Component Value Date   MICROALBUR 93.9 (H) 09/04/2016   LDLCALC 52 09/09/2018   CREATININE 5.96 (H) 05/15/2017   Microalbumin ratio 5011 done in 2/17  Lab Results  Component Value Date   MICRALBCREAT 90.6 (H) 09/04/2016     Allergies as of 09/11/2018      Reactions   Ivp Dye [iodinated Diagnostic Agents] Other (See Comments)   Burning   Propranolol Hcl Er Swelling   Iodine Rash   Nickel Rash      Medication List       Accurate as of September 11, 2018 11:59 PM. If you have any questions, ask your nurse or doctor.        Accu-Chek Aviva Plus w/Device Kit Use to check blood sugars 5 times daily What  changed:   how much to take  how to take this  when to take this  additional instructions   Accu-Chek FastClix Lancets Misc As directed   amLODipine 10 MG tablet Commonly known as: NORVASC TAKE ONE TABLET BY MOUTH ONCE DAILY   buPROPion 150 MG 24 hr tablet Commonly known as: WELLBUTRIN XL Take 150 mg daily by mouth.   Cartia XT 120 MG 24 hr capsule Generic drug: diltiazem Take 120 mg by mouth at bedtime.   Fremanezumab-vfrm 225 MG/1.5ML Sosy Commonly known as: Ajovy Inject 225 mg into the skin every 30 (thirty) days.   glucagon 1 MG injection Commonly known as: Glucagon Emergency Inject 1 mg into the skin once as needed for up to 1 dose. Inject 44m into skin once as needed for low blood sugar.   glucose blood test strip Commonly known as: Accu-Chek Aviva Plus Use to check blood sugars four times daily   insulin aspart 100 UNIT/ML injection Commonly known as: NovoLOG Inject 0-20 Units into the skin 3 (three) times daily before meals. Sliding scale   Levemir FlexTouch 100 UNIT/ML Pen Generic drug: Insulin Detemir INJECT 10 UNITS INTO THE SKIN IN THE MORNING AND INJECT 11 UNITS INTO THE SKIN IN THE EVENING.   losartan 25 MG tablet Commonly known as: COZAAR Take 50 mg by mouth at bedtime.   megestrol 40 MG tablet Commonly known as: MEGACE Take 1 tablet (40 mg total) by mouth 2 (two) times daily.   nortriptyline 10 MG capsule Commonly known as: PAMELOR Take 2 capsules (20 mg total) by mouth at bedtime.   rizatriptan 5 MG disintegrating tablet Commonly known as: Maxalt-MLT Take 1 tablet (5 mg total) by mouth as needed. May repeat in 2 hours if needed   sevelamer 800 MG tablet Commonly known as: RENAGEL Take 2,400 mg by mouth 3 (three) times daily with meals. 3 tablets with each meal and snacks       Allergies:  Allergies  Allergen Reactions  . Ivp Dye [Iodinated Diagnostic Agents] Other (See Comments)    Burning   . Propranolol Hcl Er Swelling  .  Iodine Rash  . Nickel Rash    Past Medical History:  Diagnosis Date  . Anemia   . Chronic kidney disease    Pt is on dialysis  . Depression   . Diabetes mellitus    diagnosed at age 33 Type 1  . Diabetic retinopathy ([place]   . GERD (  gastroesophageal reflux disease)   . Headache   . Hypertension   . Sickle cell trait [facility])     Past Surgical History:  Procedure Laterality Date  . AV FISTULA PLACEMENT Right 10/10/2016   Procedure: CREATION OF RIGHT ARM RADIOCEPHALIC ARTERIOVENOUS (AV) FISTULA;  Surgeon: Serafina Mitchell, MD;  Location: [place];  Service: Vascular;  Laterality: Right;  . CESAREAN SECTION N/A 10/19/2015   Procedure: CESAREAN SECTION;  Surgeon: Chancy Milroy, MD;  Location: [place];  Service: Obstetrics;  Laterality: N/A;  . lipo suction  2015  . TONSILLECTOMY AND ADENOIDECTOMY      Family History  Problem Relation Age of Onset  . Cancer Maternal Grandmother        colon and breast  . Hypertension Maternal Grandmother   . Breast cancer Maternal Grandmother   . Diabetes Maternal Grandfather   . Hypertension Maternal Grandfather   . Diabetes Mother   . Hypertension Mother   . Diabetes Father   . Hypertension Father   . Sickle cell trait Father   . Diabetes Paternal Grandmother   . Hypertension Paternal Grandmother   . Diabetes Paternal Grandfather   . Hypertension Paternal Grandfather     Social History:  reports that she has never smoked. She has never used smokeless tobacco. She reports current alcohol use. She reports that she does not use drugs.      Review of Systems   Hyperlipidemia with lipids at target, not on statin  Lab Results  Component Value Date   CHOL 143 09/09/2018   HDL 66.90 09/09/2018   LDLCALC 52 09/09/2018   TRIG 122.0 09/09/2018   CHOLHDL 2 09/09/2018   Hypertension managed by nephrologist with losartan 25 mg and Norvasc  Followed by nephrologist and at dialysis center  BP Readings from Last 3 Encounters:   01/19/18 (!) 175/98  11/18/17 139/83  09/18/17 126/78    Has history of migraines  LABS:  Clinical Support on 09/09/2018  Component Date Value Ref Range Status  . Fructosamine 09/09/2018 539* 0 - 285 umol/L Final   Comment: Published reference interval for apparently healthy subjects between age 47 and 80 is 53 - 285 umol/L and in a poorly controlled diabetic population is 228 - 563 umol/L with a mean of 396 umol/L.   Marland Kitchen Cholesterol 09/09/2018 143  0 - 200 mg/dL Final   ATP III Classification       Desirable:  < 200 mg/dL               Borderline High:  200 - 239 mg/dL          High:  > = 240 mg/dL  . Triglycerides 09/09/2018 122.0  0.0 - 149.0 mg/dL Final   Normal:  <150 mg/dLBorderline High:  150 - 199 mg/dL  . HDL 09/09/2018 66.90  >39.00 mg/dL Final  . VLDL 09/09/2018 24.4  0.0 - 40.0 mg/dL Final  . LDL Cholesterol 09/09/2018 52  0 - 99 mg/dL Final  . Total CHOL/HDL Ratio 09/09/2018 2   Final                  Men          Women1/2 Average Risk     3.4          3.3Average Risk          5.0          4.42X Average Risk  9.6          7.13X Average Risk          15.0          11.0                      . NonHDL 09/09/2018 76.36   Final   NOTE:  Non-HDL goal should be 30 mg/dL higher than patient's LDL goal (i.e. LDL goal of < 70 mg/dL, would have non-HDL goal of < 100 mg/dL)  . Glucose, Bld 09/09/2018 337* 70 - 99 mg/dL Final  . Hgb A1c MFr Bld 09/09/2018 7.9* 4.6 - 6.5 % Final   Glycemic Control Guidelines for People with Diabetes:Non Diabetic:  <6%Goal of Therapy: <7%Additional Action Suggested:  >8%     Physical Examination:  There were no vitals taken for this visit.   ASSESSMENT:  Diabetes type 1 With consistently poor control  See history of present illness for detailed discussion of current diabetes management, blood sugar patterns and problems identified  She is again very noncompliant with her follow-up A1c is likely falsely low since her home blood sugars  are averaging over 200  Her fructosamine is high at 539 indicating poor control  Currently not clear what her blood sugar patterns are because of inadequate monitoring and using generic monitor Likely is not getting enough basal insulin in the morning and mealtime coverage from current history as above However does have tendency to overnight hypoglycemia from increased basal insulin in the evenings  Again discussed day-to-day management of glucose monitoring, mealtime insulin, timing of NovoLog, potential for using CGM Unlikely to be a candidate for his insulin pump because of lack of consistent compliance and follow-up  PLAN:    She will start using the Accu-Chek meter  She needs to be checking blood sugars 4 times a day including some about 2 hours after meals  She will be a candidate for freestyle libre on her next visit if able to document monitoring  Reduce evening Levemir to 9 units and increase morning doses to 12 units for now  May need to continue adjusting this further  Mealtime coverage will be 1: 10 for NovoLog instead of 1: 15  Discussed blood sugar targets at various times  Need to take NovoLog before starting to eat preferably 15 minutes before  There are no Patient Instructions on file for this visit.     Elayne Snare 09/12/2018, 2:34 PM   Note: This note was prepared with Dragon voice recognition system technology. Any transcriptional errors that result from this process are unintentional.

## 2018-09-24 ENCOUNTER — Other Ambulatory Visit: Payer: Self-pay | Admitting: Neurology

## 2018-10-08 ENCOUNTER — Encounter: Admit: 2018-10-08 | Discharge: 2018-10-08 | Payer: MEDICARE | Attending: Nephrology | Primary: Nephrology

## 2018-10-13 DIAGNOSIS — Z01818 Encounter for other preprocedural examination: Secondary | ICD-10-CM

## 2018-10-13 DIAGNOSIS — N186 End stage renal disease: Secondary | ICD-10-CM

## 2018-10-13 DIAGNOSIS — Z7682 Awaiting organ transplant status: Secondary | ICD-10-CM

## 2018-10-18 ENCOUNTER — Other Ambulatory Visit: Payer: Self-pay | Admitting: Neurology

## 2018-10-20 MED ORDER — METOPROLOL SUCCINATE ER 50 MG TABLET,EXTENDED RELEASE 24 HR
ORAL_TABLET | 2 refills | 0 days | Status: CP
Start: 2018-10-20 — End: ?

## 2018-11-03 ENCOUNTER — Other Ambulatory Visit: Payer: Self-pay | Admitting: Neurology

## 2018-11-04 ENCOUNTER — Ambulatory Visit: Admit: 2018-11-04 | Discharge: 2018-11-05 | Payer: MEDICARE

## 2018-11-04 DIAGNOSIS — R0789 Other chest pain: Secondary | ICD-10-CM

## 2018-11-04 DIAGNOSIS — Z7682 Awaiting organ transplant status: Secondary | ICD-10-CM

## 2018-11-04 MED ORDER — PREDNISONE 10 MG TABLET
ORAL_TABLET | Freq: Every day | ORAL | 0 refills | 15.00000 days | Status: CP
Start: 2018-11-04 — End: 2018-11-05

## 2018-11-05 ENCOUNTER — Encounter: Admit: 2018-11-05 | Discharge: 2018-11-05 | Payer: MEDICARE | Attending: Nephrology | Primary: Nephrology

## 2018-11-08 ENCOUNTER — Other Ambulatory Visit: Payer: Self-pay

## 2018-11-08 ENCOUNTER — Ambulatory Visit (HOSPITAL_COMMUNITY)
Admission: EM | Admit: 2018-11-08 | Discharge: 2018-11-08 | Disposition: A | Discharge status: Home / Self Care | Payer: Medicare Other

## 2018-11-08 DIAGNOSIS — E1022 Type 1 diabetes mellitus with diabetic chronic kidney disease: Secondary | ICD-10-CM | POA: Diagnosis not present

## 2018-11-08 DIAGNOSIS — E109 Type 1 diabetes mellitus without complications: Secondary | ICD-10-CM

## 2018-11-08 DIAGNOSIS — N186 End stage renal disease: Secondary | ICD-10-CM

## 2018-11-08 DIAGNOSIS — L02415 Cutaneous abscess of right lower limb: Secondary | ICD-10-CM

## 2018-11-08 DIAGNOSIS — L0291 Cutaneous abscess, unspecified: Secondary | ICD-10-CM

## 2018-11-08 LAB — GLUCOSE, CAPILLARY: Glucose-Capillary: 104 mg/dL — ABNORMAL HIGH (ref 70–99)

## 2018-11-08 NOTE — Discharge Instructions (Addendum)
Call your nephrologist in the morning to see if he would like you to start an antibiotic.    Allow the abscess to drain.  Apply warm compresses and gently massage the area.    Return here or go to the emergency department if you have fever, chills, worsening pain, increased swelling of the area, redness, or other concerns.    Go to the emergency department if your blood sugar is >500 again.

## 2018-11-08 NOTE — ED Triage Notes (Addendum)
Pt reports a cyst to her right groin.  She states she put heat on it yesterday and it started to drain, but today it is hard.  Pt is diabetic and ESRD.  She states the lymph nodes in her groin are swollen and her BS is over 500.  Pt is currently on Flagyl for BV and had dialysis yesterday.

## 2018-11-08 NOTE — ED Provider Notes (Signed)
[facility]    CSN: [id number] Arrival date & time: 11/08/18  1219      History   Chief Complaint Chief Complaint  Patient presents with  . Cyst    Appt 1220    HPI Christine Reed is a 33 y.o. female.   Patient presents with an abscess on her right upper inner thigh x2 to 3 days.  She has attempted treatment with warm compresses but it remains hard and painful.  She denies fever, chills.  She is currently on Flagyl for bacterial vaginosis.  She has history of type 1 diabetes, DKA, and ESRD on dialysis.  She states her blood glucose was >500 this morning before taking her insulin.    The history is provided by the patient.    Past Medical History:  Diagnosis Date  . Anemia   . Chronic kidney disease    Pt is on dialysis  . Depression   . Diabetes mellitus    diagnosed at age 65; Type 1  . Diabetic retinopathy ([place])   . GERD (gastroesophageal reflux disease)   . Headache   . Hypertension   . Sickle cell trait [facility])     Patient Active Problem List   Diagnosis Date Noted  . Chronic migraine 05/08/2017  . Acute intractable headache 05/08/2017  . Sepsis ([place]) 02/22/2017  . ESRD on hemodialysis ([place]) 02/22/2017  . Uremia 02/22/2017  . DKA (diabetic ketoacidoses) ([name]) 02/22/2017  . Hyperkalemia   . DKA, type 1 ([place]) 03/01/2016  . Nausea & vomiting 03/01/2016  . Flu-like symptoms 03/01/2016  . Nephrotic syndrome 10/17/2015  . Ascites 10/17/2015  . Symptomatic anemia 09/11/2015  . Anemia affecting pregnancy in second trimester, antepartum 08/12/2015  . Diabetic nephropathy ([place]) 08/07/2015  . CKD (chronic kidney disease) stage 3, GFR 30-59 ml/min ([facility]) 08/07/2015  . Diabetic retinopathy ([place]) 07/24/2015  . Chronic hypertension during pregnancy, antepartum 07/22/2015  . Anemia, chronic disease 07/17/2015  . T1DM - Type F 06/16/2015  . Supervision of high risk pregnancy, antepartum 06/16/2015  . Type 1 diabetes mellitus with ketoacidosis without coma ([place])  02/19/2012    Past Surgical History:  Procedure Laterality Date  . AV FISTULA PLACEMENT Right 10/10/2016   Procedure: CREATION OF RIGHT ARM RADIOCEPHALIC ARTERIOVENOUS (AV) FISTULA;  Surgeon: Serafina Mitchell, MD;  Location: [place];  Service: Vascular;  Laterality: Right;  . CESAREAN SECTION N/A 10/19/2015   Procedure: CESAREAN SECTION;  Surgeon: Chancy Milroy, MD;  Location: [place];  Service: Obstetrics;  Laterality: N/A;  . lipo suction  2015  . TONSILLECTOMY AND ADENOIDECTOMY      OB History    Gravida  1   Para  1   Term      Preterm  1   AB      Living  1     SAB      TAB      Ectopic      Multiple  0   Live Births               Home Medications    Prior to Admission medications   Medication Sig Start Date End Date Taking? Authorizing Provider  ACCU-CHEK FASTCLIX LANCETS MISC As directed 12/09/16  Yes Elayne Snare, MD  amLODipine (NORVASC) 10 MG tablet TAKE ONE TABLET BY MOUTH ONCE DAILY 04/16/16  Yes Karim-Rhoades, Walidah N, CNM  Blood Glucose Monitoring Suppl (ACCU-CHEK AVIVA PLUS) w/Device KIT Use to check blood sugars 5 times daily  Patient taking differently: 1 each by Other route 4 (four) times daily. Use to check blood sugars 3-4 times daily 09/09/16  Yes Elayne Snare, MD  CARTIA XT 120 MG 24 hr capsule Take 120 mg by mouth at bedtime.  01/03/18  Yes [provider]  glucose blood (ACCU-CHEK AVIVA PLUS) test strip Use to check blood sugars four times daily 06/03/17  Yes Elayne Snare, MD  insulin aspart (NOVOLOG) 100 UNIT/ML injection Inject 0-20 Units into the skin 3 (three) times daily before meals. Sliding scale 09/10/18  Yes Elayne Snare, MD  Insulin Detemir (LEVEMIR FLEXTOUCH) 100 UNIT/ML Pen INJECT 10 UNITS INTO THE SKIN IN THE MORNING AND INJECT 11 UNITS INTO THE SKIN IN THE EVENING. 09/10/18  Yes Elayne Snare, MD  losartan (COZAAR) 25 MG tablet Take 50 mg by mouth at bedtime.    Yes [provider]  metroNIDAZOLE (FLAGYL) 500  MG tablet Take 500 mg by mouth 2 (two) times daily.   Yes [provider]  nortriptyline (PAMELOR) 10 MG capsule TAKE 2 CAPSULES BY MOUTH AT BEDTIME 09/24/18  Yes Marcial Pacas, MD  sevelamer (RENAGEL) 800 MG tablet Take 2,400 mg by mouth 3 (three) times daily with meals. 3 tablets with each meal and snacks 04/10/17  Yes [provider]  buPROPion (WELLBUTRIN XL) 150 MG 24 hr tablet Take 150 mg daily by mouth.    [provider]  Fremanezumab-vfrm (AJOVY) 225 MG/1.5ML SOSY Inject 225 mg into the skin every 30 (thirty) days. 09/18/17   Marcial Pacas, MD  glucagon (GLUCAGON EMERGENCY) 1 MG injection Inject 1 mg into the skin once as needed for up to 1 dose. Inject '1mg'$  into skin once as needed for low blood sugar. 06/03/17   Elayne Snare, MD  megestrol (MEGACE) 40 MG tablet Take 1 tablet (40 mg total) by mouth 2 (two) times daily. 01/19/18   Julianne Handler, CNM  rizatriptan (MAXALT-MLT) 5 MG disintegrating tablet Take 1 tablet (5 mg total) by mouth as needed. May repeat in 2 hours if needed 05/08/17   Marcial Pacas, MD    Family History Family History  Problem Relation Age of Onset  . Cancer Maternal Grandmother        colon and breast  . Hypertension Maternal Grandmother   . Breast cancer Maternal Grandmother   . Diabetes Maternal Grandfather   . Hypertension Maternal Grandfather   . Diabetes Mother   . Hypertension Mother   . Diabetes Father   . Hypertension Father   . Sickle cell trait Father   . Diabetes Paternal Grandmother   . Hypertension Paternal Grandmother   . Diabetes Paternal Grandfather   . Hypertension Paternal Grandfather     Social History Social History   Tobacco Use  . Smoking status: Never Smoker  . Smokeless tobacco: Never Used  Substance Use Topics  . Alcohol use: Yes    Comment: very rarely  . Drug use: No     Allergies   Ivp dye [iodinated diagnostic agents], Propranolol hcl er, Iodine, and Nickel   Review of Systems Review of Systems   Constitutional: Negative for chills and fever.  HENT: Negative for ear pain and sore throat.   Eyes: Negative for pain and visual disturbance.  Respiratory: Negative for cough and shortness of breath.   Cardiovascular: Negative for chest pain and palpitations.  Gastrointestinal: Negative for abdominal pain and vomiting.  Genitourinary: Negative for dysuria and hematuria.  Musculoskeletal: Negative for arthralgias and back pain.  Skin: Positive for  wound. Negative for color change and rash.  Neurological: Negative for seizures and syncope.  All other systems reviewed and are negative.    Physical Exam Triage Vital Signs ED Triage Vitals  Enc Vitals Group     BP 11/08/18 1234 131/82     Pulse Rate 11/08/18 1234 87     Resp 11/08/18 1234 12     Temp 11/08/18 1234 97.7 F (36.5 C)     Temp Source 11/08/18 1234 Temporal     SpO2 11/08/18 1234 97 %     Weight --      Height --      Head Circumference --      Peak Flow --      Pain Score 11/08/18 1229 6     Pain Loc --      Pain Edu? --      Excl. in [place]? --    No data found.  Updated Vital Signs BP 131/82 (BP Location: Left Arm)   Pulse 87   Temp 97.7 F (36.5 C) (Temporal)   Resp 12   LMP 04/09/2018 (Approximate)   SpO2 97%   Visual Acuity Right Eye Distance:   Left Eye Distance:   Bilateral Distance:    Right Eye Near:   Left Eye Near:    Bilateral Near:     Physical Exam Vitals signs and nursing note reviewed.  Constitutional:      General: She is not in acute distress.    Appearance: She is well-developed. She is not ill-appearing.  HENT:     Head: Normocephalic and atraumatic.     Mouth/Throat:     Mouth: Mucous membranes are moist.  Eyes:     Conjunctiva/sclera: Conjunctivae normal.  Neck:     Musculoskeletal: Neck supple.  Cardiovascular:     Rate and Rhythm: Normal rate and regular rhythm.     Heart sounds: No murmur.  Pulmonary:     Effort: Pulmonary effort is normal. No respiratory distress.      Breath sounds: Normal breath sounds.  Abdominal:     Palpations: Abdomen is soft.     Tenderness: There is no abdominal tenderness. There is no guarding or rebound.  Skin:    General: Skin is warm and dry.     Comments: 4 cm x 2 cm area of firm induration on right upper inner thigh. No drainage or erythema.  Tender to palpation.    Neurological:     Mental Status: She is alert and oriented to person, place, and time.  Psychiatric:        Mood and Affect: Mood normal.        Behavior: Behavior normal.      UC Treatments / Results  Labs (all labs ordered are listed, but only abnormal results are displayed) Labs Reviewed  GLUCOSE, CAPILLARY - Abnormal; Notable for the following components:      Result Value   Glucose-Capillary 104 (*)    All other components within normal limits  CBG MONITORING, ED    EKG   Radiology No results found.  Procedures Incision and Drainage  Date/Time: 11/08/2018 1:24 PM Performed by: Sharion Balloon, NP Authorized by: Sharion Balloon, NP   Consent:    Consent obtained:  Verbal   Consent given by:  Patient Location:    Type:  Abscess   Location:  Lower extremity   Lower extremity location:  Leg Pre-procedure details:    Skin preparation:  Antiseptic wash Anesthesia (see  MAR for exact dosages):    Anesthesia method:  Local infiltration   Local anesthetic:  Lidocaine 2% w/o epi Procedure details:    Incision types:  Single straight   Scalpel blade:  11   Drainage:  Purulent and bloody   Drainage amount:  Scant   Wound treatment:  Wound left open   Packing materials:  None Post-procedure details:    Patient tolerance of procedure:  Tolerated well, no immediate complications   (including critical care time)  Medications Ordered in UC Medications - No data to display  Initial Impression / Assessment and Plan / UC Course  I have reviewed the triage vital signs and the nursing notes.  Pertinent labs & imaging results that were  available during my care of the patient were reviewed by me and considered in my medical decision making (see chart for details).     Abscess on right upper thigh.  Incision and drainage performed.  Instructed patient to call her nephrologist in the morning to see if he would like her to start an antibiotic.  Wound care instructions and signs of infection discussed at length.  Instructed patient to return here or go to the emergency department if she has fever, chills, increased pain, increased swelling, redness, streaks, or other concerns.  Instructed her to go to the emergency department if her blood sugar is above 500 again; it was 104 here.  Patient agrees to plan of care.     Final Clinical Impressions(s) / UC Diagnoses   Final diagnoses:  Abscess     Discharge Instructions     Call your nephrologist in the morning to see if he would like you to start an antibiotic.    Allow the abscess to drain.  Apply warm compresses and gently massage the area.    Return here or go to the emergency department if you have fever, chills, worsening pain, increased swelling of the area, redness, or other concerns.    Go to the emergency department if your blood sugar is >500 again.        ED Prescriptions    None     PDMP not reviewed this encounter.   Sharion Balloon, NP 11/08/18 1331

## 2018-11-09 ENCOUNTER — Other Ambulatory Visit: Payer: Medicare Other

## 2018-11-10 ENCOUNTER — Ambulatory Visit (HOSPITAL_COMMUNITY)
Admission: EM | Admit: 2018-11-10 | Discharge: 2018-11-10 | Disposition: A | Discharge status: Home / Self Care | Payer: Medicare Other | Attending: Family Medicine | Admitting: Family Medicine

## 2018-11-10 ENCOUNTER — Other Ambulatory Visit: Payer: Self-pay

## 2018-11-10 ENCOUNTER — Encounter (HOSPITAL_COMMUNITY): Payer: Self-pay | Admitting: Emergency Medicine

## 2018-11-10 DIAGNOSIS — Z7682 Awaiting organ transplant status: Secondary | ICD-10-CM

## 2018-11-10 DIAGNOSIS — N186 End stage renal disease: Secondary | ICD-10-CM

## 2018-11-10 DIAGNOSIS — Z01818 Encounter for other preprocedural examination: Secondary | ICD-10-CM

## 2018-11-10 DIAGNOSIS — R110 Nausea: Secondary | ICD-10-CM

## 2018-11-10 DIAGNOSIS — E1065 Type 1 diabetes mellitus with hyperglycemia: Secondary | ICD-10-CM

## 2018-11-10 DIAGNOSIS — L0291 Cutaneous abscess, unspecified: Secondary | ICD-10-CM

## 2018-11-10 DIAGNOSIS — L0889 Other specified local infections of the skin and subcutaneous tissue: Secondary | ICD-10-CM | POA: Diagnosis not present

## 2018-11-10 DIAGNOSIS — R5383 Other fatigue: Secondary | ICD-10-CM | POA: Diagnosis not present

## 2018-11-10 MED ORDER — DOXYCYCLINE HYCLATE 100 MG PO CAPS: 100.0000 mg | ORAL_CAPSULE | Freq: Two times a day (BID) | ORAL | 0 refills | Status: AC

## 2018-11-10 NOTE — ED Triage Notes (Signed)
Seen 11/08/2018,  Patient reports she was feeling better yesterday.  Patient reports today she is not feeling well, nauseated, sore and cbg increasing, overall just not feeling well today.

## 2018-11-10 NOTE — ED Provider Notes (Signed)
[place]    CSN: [id number] Arrival date & time: 11/10/18  7680      History   Chief Complaint Chief Complaint  Patient presents with  . Follow-up    HPI HYDIA COPELIN is a 33 y.o. female.   Patient is a 33 year old female with past medical history of anemia, chronic kidney disease on dialysis, depression, diabetes, diabetic retinopathy, GERD, headache, hypertension, sickle cell trait.  She presents today with follow-up from 2 days ago.  She was here and had abscess I&D.  Reporting she is overall just not feeling well.  She is requesting antibiotics for the infection.  She has had some nausea overall fatigue and increased blood sugars.  No vomiting, fevers. The abscess is still draining.  Reports it has not gotten worse but remains the same.  ROS per HPI      Past Medical History:  Diagnosis Date  . Anemia   . Chronic kidney disease    Pt is on dialysis  . Depression   . Diabetes mellitus    diagnosed at age 35; Type 1  . Diabetic retinopathy ([place])   . GERD (gastroesophageal reflux disease)   . Headache   . Hypertension   . Sickle cell trait [facility])     Patient Active Problem List   Diagnosis Date Noted  . Chronic migraine 05/08/2017  . Acute intractable headache 05/08/2017  . Sepsis ([place]) 02/22/2017  . ESRD on hemodialysis ([place]) 02/22/2017  . Uremia 02/22/2017  . DKA (diabetic ketoacidoses) ([place]) 02/22/2017  . Hyperkalemia   . DKA, type 1 ([place]) 03/01/2016  . Nausea & vomiting 03/01/2016  . Flu-like symptoms 03/01/2016  . Nephrotic syndrome 10/17/2015  . Ascites 10/17/2015  . Symptomatic anemia 09/11/2015  . Anemia affecting pregnancy in second trimester, antepartum 08/12/2015  . Diabetic nephropathy ([place]) 08/07/2015  . CKD (chronic kidney disease) stage 3, GFR 30-59 ml/min (HCC) 08/07/2015  . Diabetic retinopathy ([place]) 07/24/2015  . Chronic hypertension during pregnancy, antepartum 07/22/2015  . Anemia, chronic disease 07/17/2015  . T1DM -  Type F 06/16/2015  . Supervision of high risk pregnancy, antepartum 06/16/2015  . Type 1 diabetes mellitus with ketoacidosis without coma ([place]) 02/19/2012    Past Surgical History:  Procedure Laterality Date  . AV FISTULA PLACEMENT Right 10/10/2016   Procedure: CREATION OF RIGHT ARM RADIOCEPHALIC ARTERIOVENOUS (AV) FISTULA;  Surgeon: Serafina Mitchell, MD;  Location: [facility];  Service: Vascular;  Laterality: Right;  . CESAREAN SECTION N/A 10/19/2015   Procedure: CESAREAN SECTION;  Surgeon: Chancy Milroy, MD;  Location: [facility];  Service: Obstetrics;  Laterality: N/A;  . lipo suction  2015  . TONSILLECTOMY    . TONSILLECTOMY AND ADENOIDECTOMY      OB History    Gravida  1   Para  1   Term      Preterm  1   AB      Living  1     SAB      TAB      Ectopic      Multiple  0   Live Births               Home Medications    Prior to Admission medications   Medication Sig Start Date End Date Taking? Authorizing Provider  amLODipine (NORVASC) 10 MG tablet TAKE ONE TABLET BY MOUTH ONCE DAILY 04/16/16  Yes Karim-Rhoades, Walidah N, CNM  insulin aspart (NOVOLOG) 100 UNIT/ML injection Inject 0-20 Units into the  skin 3 (three) times daily before meals. Sliding scale 09/10/18  Yes Elayne Snare, MD  Insulin Detemir (LEVEMIR FLEXTOUCH) 100 UNIT/ML Pen INJECT 10 UNITS INTO THE SKIN IN THE MORNING AND INJECT 11 UNITS INTO THE SKIN IN THE EVENING. 09/10/18  Yes Elayne Snare, MD  losartan (COZAAR) 25 MG tablet Take 50 mg by mouth at bedtime.    Yes [provider]  megestrol (MEGACE) 40 MG tablet Take 1 tablet (40 mg total) by mouth 2 (two) times daily. 01/19/18  Yes Julianne Handler, CNM  metroNIDAZOLE (FLAGYL) 500 MG tablet Take 500 mg by mouth 2 (two) times daily.   Yes [provider]  nortriptyline (PAMELOR) 10 MG capsule TAKE 2 CAPSULES BY MOUTH AT BEDTIME 09/24/18  Yes Marcial Pacas, MD  sevelamer (RENAGEL) 800 MG tablet Take 2,400 mg by mouth 3 (three) times  daily with meals. 3 tablets with each meal and snacks 04/10/17  Yes [provider]  [place] As directed 12/09/16   Elayne Snare, MD  Blood Glucose Monitoring Suppl (ACCU-CHEK AVIVA PLUS) w/Device KIT Use to check blood sugars 5 times daily Patient taking differently: 1 each by Other route 4 (four) times daily. Use to check blood sugars 3-4 times daily 09/09/16   Elayne Snare, MD  doxycycline (VIBRAMYCIN) 100 MG capsule Take 1 capsule (100 mg total) by mouth 2 (two) times daily for 7 days. 11/10/18 11/17/18  Loura Halt A, NP  Fremanezumab-vfrm (AJOVY) 225 MG/1.5ML SOSY Inject 225 mg into the skin every 30 (thirty) days. 09/18/17   Marcial Pacas, MD  glucagon (GLUCAGON EMERGENCY) 1 MG injection Inject 1 mg into the skin once as needed for up to 1 dose. Inject 60m into skin once as needed for low blood sugar. 06/03/17   KElayne Snare MD  glucose blood (ACCU-CHEK AVIVA PLUS) test strip Use to check blood sugars four times daily 06/03/17   KElayne Snare MD  rizatriptan (MAXALT-MLT) 5 MG disintegrating tablet Take 1 tablet (5 mg total) by mouth as needed. May repeat in 2 hours if needed 05/08/17   YMarcial Pacas MD  buPROPion (WELLBUTRIN XL) 150 MG 24 hr tablet Take 150 mg daily by mouth.  11/10/18  [provider]    Family History Family History  Problem Relation Age of Onset  . Cancer Maternal Grandmother        colon and breast  . Hypertension Maternal Grandmother   . Breast cancer Maternal Grandmother   . Diabetes Maternal Grandfather   . Hypertension Maternal Grandfather   . Diabetes Mother   . Hypertension Mother   . Diabetes Father   . Hypertension Father   . Sickle cell trait Father   . Diabetes Paternal Grandmother   . Hypertension Paternal Grandmother   . Diabetes Paternal Grandfather   . Hypertension Paternal Grandfather     Social History Social History   Tobacco Use  . Smoking status: Never Smoker  . Smokeless tobacco: Never Used  Substance Use  Topics  . Alcohol use: Yes    Comment: very rarely  . Drug use: No     Allergies   Ivp dye [iodinated diagnostic agents], Propranolol hcl er, Iodine, and Nickel   Review of Systems Review of Systems   Physical Exam Triage Vital Signs ED Triage Vitals  Enc Vitals Group     BP 11/10/18 0858 121/67     Pulse Rate 11/10/18 0858 84     Resp 11/10/18 0858 18     Temp 11/10/18  0858 98.2 F (36.8 C)     Temp Source 11/10/18 0858 Oral     SpO2 11/10/18 0858 97 %     Weight --      Height --      Head Circumference --      Peak Flow --      Pain Score 11/10/18 0853 8     Pain Loc --      Pain Edu? --      Excl. in [place]? --    No data found.  Updated Vital Signs BP 121/67 (BP Location: Left Arm)   Pulse 84   Temp 98.2 F (36.8 C) (Oral)   Resp 18   LMP 04/11/2018   SpO2 97%   Visual Acuity Right Eye Distance:   Left Eye Distance:   Bilateral Distance:    Right Eye Near:   Left Eye Near:    Bilateral Near:     Physical Exam Vitals signs and nursing note reviewed.  Constitutional:      General: She is not in acute distress.    Appearance: Normal appearance. She is not toxic-appearing or diaphoretic.  HENT:     Head: Normocephalic.     Nose: Nose normal.     Mouth/Throat:     Pharynx: Oropharynx is clear.  Eyes:     Conjunctiva/sclera: Conjunctivae normal.  Neck:     Musculoskeletal: Normal range of motion.  Pulmonary:     Effort: Pulmonary effort is normal.  Musculoskeletal: Normal range of motion.  Skin:    General: Skin is warm and dry.     Findings: No rash.     Comments: Abscess to right upper thigh that is draining.   Neurological:     Mental Status: She is alert.  Psychiatric:        Mood and Affect: Mood normal.      UC Treatments / Results  Labs (all labs ordered are listed, but only abnormal results are displayed) Labs Reviewed - No data to display  EKG   Radiology No results found.  Procedures Procedures (including critical  care time)  Medications Ordered in UC Medications - No data to display  Initial Impression / Assessment and Plan / UC Course  I have reviewed the triage vital signs and the nursing notes.  Pertinent labs & imaging results that were available during my care of the patient were reviewed by me and considered in my medical decision making (see chart for details).     Patient here requesting antibiotics for abscess to right upper thigh.  The area has been draining and does not appear to be worse but she is overall not feeling well and having some nausea. We will go ahead and start her on doxycycline.  This is safe for renal patients and does not have to be renally dosed. Recommended following up with her doctor for recheck in a few days.  Final Clinical Impressions(s) / UC Diagnoses   Final diagnoses:  Abscess     Discharge Instructions     Treating you with doxycycline for infection Follow up with you doctor as needed.     ED Prescriptions    Medication Sig Dispense Auth. Provider   doxycycline (VIBRAMYCIN) 100 MG capsule Take 1 capsule (100 mg total) by mouth 2 (two) times daily for 7 days. 14 capsule Zackary Mckeone A, NP     PDMP not reviewed this encounter.   Loura Halt A, NP 11/10/18 1008

## 2018-11-10 NOTE — Discharge Instructions (Addendum)
Treating you with doxycycline for infection Follow up with you doctor as needed.

## 2018-11-11 ENCOUNTER — Encounter: Payer: Medicare Other | Admitting: Endocrinology

## 2018-11-11 NOTE — Progress Notes (Signed)
This encounter was created in error - please disregard.

## 2018-11-23 ENCOUNTER — Other Ambulatory Visit: Payer: Self-pay

## 2018-11-23 ENCOUNTER — Other Ambulatory Visit (INDEPENDENT_AMBULATORY_CARE_PROVIDER_SITE_OTHER): Payer: Medicare Other

## 2018-11-23 DIAGNOSIS — E1065 Type 1 diabetes mellitus with hyperglycemia: Secondary | ICD-10-CM

## 2018-11-23 LAB — GLUCOSE, RANDOM: Glucose, Bld: 344 mg/dL — ABNORMAL HIGH (ref 70–99)

## 2018-11-24 LAB — FRUCTOSAMINE: Fructosamine: 582 umol/L — ABNORMAL HIGH (ref 0–285)

## 2018-11-27 ENCOUNTER — Ambulatory Visit (INDEPENDENT_AMBULATORY_CARE_PROVIDER_SITE_OTHER): Payer: Medicare Other | Admitting: Endocrinology

## 2018-11-27 ENCOUNTER — Other Ambulatory Visit: Payer: Self-pay

## 2018-11-27 ENCOUNTER — Encounter: Payer: Self-pay | Admitting: Endocrinology

## 2018-11-27 VITALS — Ht 65.0 in

## 2018-11-27 DIAGNOSIS — E1065 Type 1 diabetes mellitus with hyperglycemia: Secondary | ICD-10-CM | POA: Diagnosis not present

## 2018-11-27 NOTE — Progress Notes (Signed)
Patient ID: Christine Reed, female   DOB: 18-Nov-1985, 33 y.o.   MRN: [id number]          Today's office visit was provided via telemedicine using video technique The patient was explained the limitations of evaluation and management by telemedicine and the availability of in person appointments.  The patient understood the limitations and agreed to proceed. Patient also understood that the telehealth visit is billable. . Location of the patient: Patient's home . Location of the provider: Physician office Only the patient and myself were participating in the encounter    Reason for Appointment : Follow-up for Type 1 Diabetes  History of Present Illness          Diagnosis: Type 1 diabetes mellitus, date of diagnosis: 2000         Previous history:   She was initially started on NPH and Regular Insulin and subsequently was on Lantus and Humalog Usually has had poor control but records are not available for the last few years from her previous endocrinologist In April her Lantus was changed Novolin N presumably because of tendency to low sugars during the night  Recent history:   INSULIN regimen is: Novolog at meals carbohydrate coverage 3 units per starch serving, correction factor 1 units per 50 mg over 150  Levemir 10-morning and 10 units in evening at bedtime   A1c is last 7.9 However fructosamine is 582, previously 539   Current management, blood sugar patterns and problems identified:    She is checking blood sugars mostly on waking up and in the early afternoon after dialysis  As before she does not check readings later in the day much higher and only a few blood sugars are available from telephone review of her meter results  She thinks that she goes to bed right after dinner and will not check readings after meals  Has also been asked to check blood sugars 4 times a day to be eligible to have the freestyle libre  She was told to go up on her morning Levemir and reduce  evening dose but she is taking the same dose twice a day  She is very inconsistent with entering how she is calculating her mealtime NovoLog, she has not started checking blood sugars before dinner lately  Also she will take 4 or 5 units of insulin at suppertime even with relatively small amount of carbohydrate and is not apparently counting carbohydrates as discussed previously  Most of the time she is taking 3 units per carbohydrate serving giving her carbohydrate ratio of approximately 1: 5    Glucose monitoring:  is being done 3-5 times a day         Glucometer:  [employer]  Blood Glucose readings from download   PRE-MEAL Fasting Lunch Dinner  1-2 AM Overall  Glucose range:  85-530  68-464  87  79, 45   Mean/median:     ?     Factors causing hypoglycemia: Excessive insulin for high sugars, occasionally overnight, Symptoms of hypoglycemia:Weakness Treatment of hypoglycemia: As above          Self-care: The diet that the patient has been following is: Carbohydrate counting Usually eating cream of wheat at breakfast, mostly salad at lunch and dinner is usually at 8-10 PM  Snacks usually are apple or crackers          Exercise: none          Dietician consultation: Most recent: 5/17 in the hospital .  CDE consultation:?  Wt Readings from Last 3 Encounters:  01/19/18 68 lb (30.8 kg)  11/18/17 154 lb (69.9 kg)  09/18/17 145 lb 8 oz (66 kg)    Diabetes labs:  Lab Results  Component Value Date   HGBA1C 7.9 (H) 09/09/2018   HGBA1C 8.7 11/29/2016   HGBA1C 7.8 (H) 09/04/2016   Lab Results  Component Value Date   MICROALBUR 93.9 (H) 09/04/2016   LDLCALC 52 09/09/2018   CREATININE 5.96 (H) 05/15/2017   Microalbumin ratio 5011 done in 2/17  Lab Results  Component Value Date   MICRALBCREAT 90.6 (H) 09/04/2016     Allergies as of 11/27/2018      Reactions   Ivp Dye [iodinated Diagnostic Agents] Other (See Comments)   Burning   Propranolol Hcl Er  Swelling   Iodine Rash   Nickel Rash      Medication List       Accurate as of November 27, 2018  2:29 PM. If you have any questions, ask your nurse or doctor.        Accu-Chek Aviva Plus w/Device Kit Use to check blood sugars 5 times daily What changed:   how much to take  how to take this  when to take this  additional instructions   Accu-Chek FastClix Lancets Misc As directed   amLODipine 10 MG tablet Commonly known as: NORVASC TAKE ONE TABLET BY MOUTH ONCE DAILY   Fremanezumab-vfrm 225 MG/1.5ML Sosy Commonly known as: Ajovy Inject 225 mg into the skin every 30 (thirty) days.   glucagon 1 MG injection Commonly known as: Glucagon Emergency Inject 1 mg into the skin once as needed for up to 1 dose. Inject 19m into skin once as needed for low blood sugar.   glucose blood test strip Commonly known as: Accu-Chek Aviva Plus Use to check blood sugars four times daily   insulin aspart 100 UNIT/ML injection Commonly known as: NovoLOG Inject 0-20 Units into the skin 3 (three) times daily before meals. Sliding scale   Levemir FlexTouch 100 UNIT/ML Pen Generic drug: Insulin Detemir INJECT 10 UNITS INTO THE SKIN IN THE MORNING AND INJECT 11 UNITS INTO THE SKIN IN THE EVENING.   losartan 25 MG tablet Commonly known as: COZAAR Take 50 mg by mouth at bedtime.   megestrol 40 MG tablet Commonly known as: MEGACE Take 1 tablet (40 mg total) by mouth 2 (two) times daily.   metroNIDAZOLE 500 MG tablet Commonly known as: FLAGYL Take 500 mg by mouth 2 (two) times daily.   nortriptyline 10 MG capsule Commonly known as: PAMELOR TAKE 2 CAPSULES BY MOUTH AT BEDTIME   rizatriptan 5 MG disintegrating tablet Commonly known as: Maxalt-MLT Take 1 tablet (5 mg total) by mouth as needed. May repeat in 2 hours if needed   sevelamer 800 MG tablet Commonly known as: RENAGEL Take 2,400 mg by mouth 3 (three) times daily with meals. 3 tablets with each meal and snacks        Allergies:  Allergies  Allergen Reactions  . Ivp Dye [Iodinated Diagnostic Agents] Other (See Comments)    Burning   . Propranolol Hcl Er Swelling  . Iodine Rash  . Nickel Rash    Past Medical History:  Diagnosis Date  . Anemia   . Chronic kidney disease    Pt is on dialysis  . Depression   . Diabetes mellitus    diagnosed at age 33 Type 1  . [place]   . GERD (gastroesophageal  reflux disease)   . Headache   . Hypertension   . Sickle cell trait [facility])     Past Surgical History:  Procedure Laterality Date  . AV FISTULA PLACEMENT Right 10/10/2016   Procedure: CREATION OF RIGHT ARM RADIOCEPHALIC ARTERIOVENOUS (AV) FISTULA;  Surgeon: Serafina Mitchell, MD;  Location: [facility];  Service: Vascular;  Laterality: Right;  . CESAREAN SECTION N/A 10/19/2015   Procedure: CESAREAN SECTION;  Surgeon: Chancy Milroy, MD;  Location: [facility];  Service: Obstetrics;  Laterality: N/A;  . lipo suction  2015  . TONSILLECTOMY    . TONSILLECTOMY AND ADENOIDECTOMY      Family History  Problem Relation Age of Onset  . Cancer Maternal Grandmother        colon and breast  . Hypertension Maternal Grandmother   . Breast cancer Maternal Grandmother   . Diabetes Maternal Grandfather   . Hypertension Maternal Grandfather   . Diabetes Mother   . Hypertension Mother   . Diabetes Father   . Hypertension Father   . Sickle cell trait Father   . Diabetes Paternal Grandmother   . Hypertension Paternal Grandmother   . Diabetes Paternal Grandfather   . Hypertension Paternal Grandfather     Social History:  reports that she has never smoked. She has never used smokeless tobacco. She reports current alcohol use. She reports that she does not use drugs.      Review of Systems   Hyperlipidemia with lipids at target, not on statin  Lab Results  Component Value Date   CHOL 143 09/09/2018   HDL 66.90 09/09/2018   LDLCALC 52 09/09/2018   TRIG 122.0 09/09/2018   CHOLHDL 2  09/09/2018   Hypertension managed by nephrologist with losartan 25 mg and Norvasc  Followed by nephrologist and at dialysis center  BP Readings from Last 3 Encounters:  11/10/18 121/67  11/08/18 131/82  01/19/18 (!) 175/98    Has history of migraines  LABS:  Lab on 11/23/2018  Component Date Value Ref Range Status  . Glucose, Bld 11/23/2018 344* 70 - 99 mg/dL Final  . Fructosamine 11/23/2018 582* 0 - 285 umol/L Final   Comment: Published reference interval for apparently healthy subjects between age 18 and 27 is 34 - 285 umol/L and in a poorly controlled diabetic population is 228 - 563 umol/L with a mean of 396 umol/L.     Physical Examination:  Ht 5' 5" (1.651 m)   BMI 11.32 kg/m    ASSESSMENT:  Diabetes type 1, has consistently poor control  See history of present illness for detailed discussion of current diabetes management, blood sugar patterns and problems identified   A1c of 7.9 in July, has not had recent labs because of irregular follow-up  Her fructosamine is high at 582 compared to 539 indicating poor control  Mostly has had markedly increased blood sugars in the mornings However at this same time since she has low sugars between 1-2 AM likely she is getting too much mealtime coverage at suppertime and not enough basal insulin overnight most of the time She is adjusting her Levemir instead of mealtime dose if she has low sugars during the night  As before difficult to judge her blood sugar apparently because of lack of adequate monitoring especially after her meals including in the evenings   PLAN:    She will start checking her blood sugars sugars 4 times a day  To check more readings after lunch and if possible after dinner  also  Discussed blood sugar targets fasting and after meals  She will be a candidate for freestyle libre on her next visit if able to document monitoring  Reduce evening NovoLog mealtime coverage to 2 units per starch  insulin 3 units per carbohydrate exchange  If the morning sugars are continuing to be higher again she will need to go up to 11 or 12 for the evening Levemir  Avoid higher fat foods but if eating something with high fat content will need to add 2 to 3 unit additional NovoLog  Otherwise continue Levemir 10 units twice daily  Follow-up in 2 months with A1c  There are no Patient Instructions on file for this visit.   Counseling time on subjects discussed in assessment and plan sections is over 50% of today's 25 minute visit   Elayne Snare 11/27/2018, 2:29 PM   Note: This note was prepared with Dragon voice recognition system technology. Any transcriptional errors that result from this process are unintentional.

## 2018-12-10 ENCOUNTER — Encounter: Admit: 2018-12-10 | Discharge: 2018-12-10 | Payer: MEDICARE | Attending: Nephrology | Primary: Nephrology

## 2018-12-18 DIAGNOSIS — Z7682 Awaiting organ transplant status: Principal | ICD-10-CM

## 2018-12-18 DIAGNOSIS — Z01818 Encounter for other preprocedural examination: Principal | ICD-10-CM

## 2018-12-18 DIAGNOSIS — N186 End stage renal disease: Principal | ICD-10-CM

## 2018-12-28 DIAGNOSIS — Z7682 Awaiting organ transplant status: Principal | ICD-10-CM

## 2018-12-28 DIAGNOSIS — R079 Chest pain, unspecified: Principal | ICD-10-CM

## 2019-01-06 ENCOUNTER — Observation Stay (HOSPITAL_COMMUNITY)
Admission: EM | Admit: 2019-01-06 | Discharge: 2019-01-07 | Disposition: A | Discharge status: Home / Self Care | Payer: Medicare Other | Attending: Internal Medicine | Admitting: Internal Medicine

## 2019-01-06 ENCOUNTER — Other Ambulatory Visit: Payer: Self-pay

## 2019-01-06 ENCOUNTER — Encounter (HOSPITAL_COMMUNITY): Payer: Self-pay

## 2019-01-06 ENCOUNTER — Emergency Department (HOSPITAL_COMMUNITY): Payer: Medicare Other

## 2019-01-06 ENCOUNTER — Inpatient Hospital Stay (HOSPITAL_COMMUNITY): Payer: Medicare Other

## 2019-01-06 ENCOUNTER — Encounter: Admit: 2019-01-06 | Discharge: 2019-01-06 | Payer: MEDICARE | Attending: Nephrology | Primary: Nephrology

## 2019-01-06 DIAGNOSIS — E1010 Type 1 diabetes mellitus with ketoacidosis without coma: Secondary | ICD-10-CM | POA: Diagnosis present

## 2019-01-06 DIAGNOSIS — N186 End stage renal disease: Secondary | ICD-10-CM | POA: Diagnosis present

## 2019-01-06 DIAGNOSIS — R112 Nausea with vomiting, unspecified: Secondary | ICD-10-CM | POA: Insufficient documentation

## 2019-01-06 DIAGNOSIS — Z992 Dependence on renal dialysis: Secondary | ICD-10-CM | POA: Insufficient documentation

## 2019-01-06 DIAGNOSIS — D573 Sickle-cell trait: Secondary | ICD-10-CM | POA: Diagnosis not present

## 2019-01-06 DIAGNOSIS — Z79899 Other long term (current) drug therapy: Secondary | ICD-10-CM | POA: Insufficient documentation

## 2019-01-06 DIAGNOSIS — F419 Anxiety disorder, unspecified: Secondary | ICD-10-CM | POA: Insufficient documentation

## 2019-01-06 DIAGNOSIS — E1022 Type 1 diabetes mellitus with diabetic chronic kidney disease: Secondary | ICD-10-CM | POA: Diagnosis not present

## 2019-01-06 DIAGNOSIS — D72829 Elevated white blood cell count, unspecified: Secondary | ICD-10-CM | POA: Insufficient documentation

## 2019-01-06 DIAGNOSIS — F329 Major depressive disorder, single episode, unspecified: Secondary | ICD-10-CM | POA: Insufficient documentation

## 2019-01-06 DIAGNOSIS — K219 Gastro-esophageal reflux disease without esophagitis: Secondary | ICD-10-CM | POA: Diagnosis not present

## 2019-01-06 DIAGNOSIS — R079 Chest pain, unspecified: Secondary | ICD-10-CM | POA: Insufficient documentation

## 2019-01-06 DIAGNOSIS — I120 Hypertensive chronic kidney disease with stage 5 chronic kidney disease or end stage renal disease: Secondary | ICD-10-CM | POA: Diagnosis not present

## 2019-01-06 DIAGNOSIS — I10 Essential (primary) hypertension: Secondary | ICD-10-CM | POA: Diagnosis present

## 2019-01-06 DIAGNOSIS — Z794 Long term (current) use of insulin: Secondary | ICD-10-CM | POA: Diagnosis not present

## 2019-01-06 DIAGNOSIS — R109 Unspecified abdominal pain: Secondary | ICD-10-CM | POA: Diagnosis present

## 2019-01-06 DIAGNOSIS — Z20828 Contact with and (suspected) exposure to other viral communicable diseases: Secondary | ICD-10-CM | POA: Insufficient documentation

## 2019-01-06 DIAGNOSIS — E1110 Type 2 diabetes mellitus with ketoacidosis without coma: Secondary | ICD-10-CM | POA: Diagnosis present

## 2019-01-06 DIAGNOSIS — R1084 Generalized abdominal pain: Secondary | ICD-10-CM | POA: Diagnosis not present

## 2019-01-06 LAB — HEMOGLOBIN A1C
Hgb A1c MFr Bld: 8.5 % — ABNORMAL HIGH (ref 4.8–5.6)
Mean Plasma Glucose: 197.25 mg/dL

## 2019-01-06 LAB — I-STAT BETA HCG BLOOD, ED (MC, WL, AP ONLY): I-stat hCG, quantitative: <5 m[IU]/mL (ref <5)

## 2019-01-06 LAB — POCT I-STAT 7, (LYTES, BLD GAS, ICA,H+H)
Acid-base deficit: 1 mmol/L (ref 0.0–2.0)
Bicarbonate: 22.7 mmol/L (ref 20.0–28.0)
Calcium, Ion: 0.92 mmol/L — ABNORMAL LOW (ref 1.15–1.40)
HCT: 37 % (ref 36.0–46.0)
Hemoglobin: 12.6 g/dL (ref 12.0–15.0)
O2 Saturation: 97 %
Patient temperature: 98.6
Potassium: 5.9 mmol/L — ABNORMAL HIGH (ref 3.5–5.1)
Sodium: 130 mmol/L — ABNORMAL LOW (ref 135–145)
TCO2: 24 mmol/L (ref 22–32)
pCO2 arterial: 34.5 mmHg (ref 32.0–48.0)
pH, Arterial: 7.427 (ref 7.350–7.450)
pO2, Arterial: 90 mmHg (ref 83.0–108.0)

## 2019-01-06 LAB — BASIC METABOLIC PANEL
Anion gap: 24 — ABNORMAL HIGH (ref 5–15)
Anion gap: 24 — ABNORMAL HIGH (ref 5–15)
Anion gap: 19 — ABNORMAL HIGH (ref 5–15)
BUN: 27 mg/dL — ABNORMAL HIGH (ref 6–20)
BUN: 35 mg/dL — ABNORMAL HIGH (ref 6–20)
BUN: 38 mg/dL — ABNORMAL HIGH (ref 6–20)
CO2: 24 mmol/L (ref 22–32)
CO2: 21 mmol/L — ABNORMAL LOW (ref 22–32)
CO2: 25 mmol/L (ref 22–32)
Calcium: 8.4 mg/dL — ABNORMAL LOW (ref 8.9–10.3)
Calcium: 8.1 mg/dL — ABNORMAL LOW (ref 8.9–10.3)
Calcium: 7.7 mg/dL — ABNORMAL LOW (ref 8.9–10.3)
Chloride: 89 mmol/L — ABNORMAL LOW (ref 98–111)
Chloride: 90 mmol/L — ABNORMAL LOW (ref 98–111)
Chloride: 97 mmol/L — ABNORMAL LOW (ref 98–111)
Creatinine, Ser: 9.32 mg/dL — ABNORMAL HIGH (ref 0.44–1.00)
Creatinine, Ser: 10.09 mg/dL — ABNORMAL HIGH (ref 0.44–1.00)
Creatinine, Ser: 10.43 mg/dL — ABNORMAL HIGH (ref 0.44–1.00)
GFR calc Af Amer: 6 mL/min — ABNORMAL LOW (ref >60)
GFR calc Af Amer: 5 mL/min — ABNORMAL LOW (ref >60)
GFR calc Af Amer: 5 mL/min — ABNORMAL LOW (ref >60)
GFR calc non Af Amer: 5 mL/min — ABNORMAL LOW (ref >60)
GFR calc non Af Amer: 5 mL/min — ABNORMAL LOW (ref >60)
GFR calc non Af Amer: 4 mL/min — ABNORMAL LOW (ref >60)
Glucose, Bld: 329 mg/dL — ABNORMAL HIGH (ref 70–99)
Glucose, Bld: 325 mg/dL — ABNORMAL HIGH (ref 70–99)
Glucose, Bld: 105 mg/dL — ABNORMAL HIGH (ref 70–99)
Potassium: 4.5 mmol/L (ref 3.5–5.1)
Potassium: 5 mmol/L (ref 3.5–5.1)
Potassium: 4.2 mmol/L (ref 3.5–5.1)
Sodium: 137 mmol/L (ref 135–145)
Sodium: 135 mmol/L (ref 135–145)
Sodium: 141 mmol/L (ref 135–145)

## 2019-01-06 LAB — CBG MONITORING, ED
Glucose-Capillary: 350 mg/dL — ABNORMAL HIGH (ref 70–99)
Glucose-Capillary: 296 mg/dL — ABNORMAL HIGH (ref 70–99)
Glucose-Capillary: 157 mg/dL — ABNORMAL HIGH (ref 70–99)

## 2019-01-06 LAB — TROPONIN I (HIGH SENSITIVITY)
Troponin I (High Sensitivity): 10 ng/L (ref <18)
Troponin I (High Sensitivity): 8 ng/L (ref <18)
Troponin I (High Sensitivity): 11 ng/L (ref <18)

## 2019-01-06 LAB — GLUCOSE, CAPILLARY
Glucose-Capillary: 94 mg/dL (ref 70–99)
Glucose-Capillary: 107 mg/dL — ABNORMAL HIGH (ref 70–99)
Glucose-Capillary: 160 mg/dL — ABNORMAL HIGH (ref 70–99)
Glucose-Capillary: 232 mg/dL — ABNORMAL HIGH (ref 70–99)

## 2019-01-06 LAB — SARS CORONAVIRUS 2 (TAT 6-24 HRS): SARS Coronavirus 2: NEGATIVE

## 2019-01-06 LAB — CBC
HCT: 36.8 % (ref 36.0–46.0)
HCT: 33.2 % — ABNORMAL LOW (ref 36.0–46.0)
Hemoglobin: 12.3 g/dL (ref 12.0–15.0)
Hemoglobin: 11.2 g/dL — ABNORMAL LOW (ref 12.0–15.0)
MCH: 33.9 pg (ref 26.0–34.0)
MCH: 33.8 pg (ref 26.0–34.0)
MCHC: 33.4 g/dL (ref 30.0–36.0)
MCHC: 33.7 g/dL (ref 30.0–36.0)
MCV: 101.4 fL — ABNORMAL HIGH (ref 80.0–100.0)
MCV: 100.3 fL — ABNORMAL HIGH (ref 80.0–100.0)
Platelets: 275 10*3/uL (ref 150–400)
Platelets: 254 10*3/uL (ref 150–400)
RBC: 3.63 MIL/uL — ABNORMAL LOW (ref 3.87–5.11)
RBC: 3.31 MIL/uL — ABNORMAL LOW (ref 3.87–5.11)
RDW: 15.3 % (ref 11.5–15.5)
RDW: 15.3 % (ref 11.5–15.5)
WBC: 15.7 10*3/uL — ABNORMAL HIGH (ref 4.0–10.5)
WBC: 13.3 10*3/uL — ABNORMAL HIGH (ref 4.0–10.5)
nRBC: 0 % (ref 0.0–0.2)
nRBC: 0 % (ref 0.0–0.2)

## 2019-01-06 LAB — MAGNESIUM: Magnesium: 2.3 mg/dL (ref 1.7–2.4)

## 2019-01-06 LAB — LIPASE, BLOOD: Lipase: 20 U/L (ref 11–51)

## 2019-01-06 LAB — LACTIC ACID, PLASMA: Lactic Acid, Venous: 1.9 mmol/L (ref 0.5–1.9)

## 2019-01-06 LAB — HIV ANTIBODY (ROUTINE TESTING W REFLEX): HIV Screen 4th Generation wRfx: NONREACTIVE

## 2019-01-06 LAB — D-DIMER, QUANTITATIVE: D-Dimer, Quant: 0.39 ug/mL-FEU (ref 0.00–0.50)

## 2019-01-06 LAB — BETA-HYDROXYBUTYRIC ACID: Beta-Hydroxybutyric Acid: 3.14 mmol/L — ABNORMAL HIGH (ref 0.05–0.27)

## 2019-01-06 LAB — TSH: TSH: 2.444 u[IU]/mL (ref 0.350–4.500)

## 2019-01-06 LAB — VITAMIN B12: Vitamin B-12: 471 pg/mL (ref 180–914)

## 2019-01-06 MED ORDER — ACETAMINOPHEN 325 MG PO TABS
650.0000 mg | ORAL_TABLET | Freq: Once | ORAL | Status: DC
  Filled 2019-01-06: qty 2

## 2019-01-06 MED ORDER — LIDOCAINE VISCOUS HCL 2 % MT SOLN
15.0000 mL | Freq: Once | OROMUCOSAL | Status: AC
  Administered 2019-01-06: 15 mL via ORAL
  Filled 2019-01-06: qty 15

## 2019-01-06 MED ORDER — ALUM & MAG HYDROXIDE-SIMETH 200-200-20 MG/5ML PO SUSP
30.0000 mL | Freq: Once | ORAL | Status: AC
  Administered 2019-01-06: 14:00:00 30 mL via ORAL
  Filled 2019-01-06: qty 30

## 2019-01-06 MED ORDER — INSULIN REGULAR(HUMAN) IN NACL 100-0.9 UT/100ML-% IV SOLN
INTRAVENOUS | Status: DC
  Administered 2019-01-06: 2.6 [IU]/h via INTRAVENOUS
  Filled 2019-01-06: qty 100

## 2019-01-06 MED ORDER — HYDRALAZINE HCL 20 MG/ML IJ SOLN: 10.0000 mg | Freq: Four times a day (QID) | INTRAMUSCULAR | Status: DC | PRN

## 2019-01-06 MED ORDER — SODIUM CHLORIDE 0.9% FLUSH: 3.0000 mL | Freq: Once | INTRAVENOUS | Status: DC

## 2019-01-06 MED ORDER — ONDANSETRON HCL 4 MG/2ML IJ SOLN
4.0000 mg | Freq: Once | INTRAMUSCULAR | Status: AC
  Administered 2019-01-06: 4 mg via INTRAVENOUS
  Filled 2019-01-06: qty 2

## 2019-01-06 MED ORDER — DEXTROSE 50 % IV SOLN: 0.0000 mL | INTRAVENOUS | Status: DC | PRN

## 2019-01-06 MED ORDER — ONDANSETRON HCL 4 MG/2ML IJ SOLN
4.0000 mg | Freq: Four times a day (QID) | INTRAMUSCULAR | Status: DC | PRN
  Administered 2019-01-06: 4 mg via INTRAVENOUS
  Filled 2019-01-06: qty 2

## 2019-01-06 MED ORDER — DEXTROSE-NACL 5-0.45 % IV SOLN
INTRAVENOUS | Status: DC
  Administered 2019-01-06: 19:00:00 via INTRAVENOUS

## 2019-01-06 MED ORDER — SODIUM CHLORIDE 0.9 % IV BOLUS (SEPSIS)
1000.0000 mL | Freq: Once | INTRAVENOUS | Status: AC
  Administered 2019-01-06: 1000 mL via INTRAVENOUS

## 2019-01-06 MED ORDER — PROMETHAZINE HCL 25 MG/ML IJ SOLN
12.5000 mg | Freq: Once | INTRAMUSCULAR | Status: AC
  Administered 2019-01-06: 12.5 mg via INTRAVENOUS
  Filled 2019-01-06: qty 1

## 2019-01-06 MED ORDER — INSULIN ASPART 100 UNIT/ML ~~LOC~~ SOLN
10.0000 [IU] | Freq: Once | SUBCUTANEOUS | Status: AC
  Administered 2019-01-06: 10 [IU] via SUBCUTANEOUS

## 2019-01-06 MED ORDER — HEPARIN SODIUM (PORCINE) 5000 UNIT/ML IJ SOLN
5000.0000 [IU] | Freq: Three times a day (TID) | INTRAMUSCULAR | Status: DC
  Administered 2019-01-06 – 2019-01-07 (×3): 5000 [IU] via SUBCUTANEOUS
  Filled 2019-01-06 (×3): qty 1

## 2019-01-06 MED ORDER — SODIUM CHLORIDE 0.9 % IV SOLN: INTRAVENOUS | Status: DC

## 2019-01-06 NOTE — ED Triage Notes (Signed)
Pt reports centralized chest pain that radiates through to her back that started during dialysis, pt had to cut her treatment short today due to the pain, she had 30 minutes left in her session. Pt also states she vomited twice today when she got home. Pt a.o, nad noted.

## 2019-01-06 NOTE — H&P (Signed)
History and Physical    Christine Reed LDJ:[id number] DOB: July 18, 1985 DOA: 01/06/2019  PCP: Rexene Agent, MD  Patient coming from: Home  I have personally briefly reviewed patient's old medical records in [place]  Chief Complaint: nausea, vomiting & chest pain  HPI: Christine Reed is a 33 y.o. female with medical history significant of end-stage renal disease on hemodialysis (TTS), type 1 diabetes, anxiety/depression, hypertension presents to emergency department due to worsening nausea, vomiting and chest pain that started suddenly while she was at dialysis.  Patient reports that she went for dialysis this morning however at the end of session she started having chest pain, midsternal, 7 out of 10, radiates to her back, pressure-like, constant, no aggravating or relieving factors, denies association with shortness of breath, leg swelling, palpitation, diaphoresis.  She also has nausea and vomiting-she vomited twice.  She had to stop her dialysis session 30 minutes early.  She went to home and her mom brought patient to the emergency department for further evaluation and management.  Reports that she did not take her insulin this morning however she is compliant with it.  Her blood sugar this morning was 98.  Reports diarrhea and generalized abdominal pain since 2 weeks.  Diarrhea: 3-4 episodes per day, nonbloody, no mucus.  She denies any urinary symptoms, cough, congestion, fever, chills, runny nose, sore throat, recent COVID-19 exposure.  She lives with her son and denies smoking, alcohol, illicit drug use.  ED Course: Upon arrival: Patient's tachypneic, on room air, chest x-ray negative for acute findings.  Blood glucose elevated at 329, anion gap 24, patient received 10 units of insulin, CBC shows leukocytosis of 15.7, initial troponin negative.  D-dimer: WNL.  UA and COVID-19 pending.   Review of Systems: As per HPI otherwise negative.    Past Medical History:  Diagnosis  Date   Anemia    Chronic kidney disease    Pt is on dialysis   Depression    Diabetes mellitus    diagnosed at age 27; Type 1   Diabetic retinopathy ([place])    GERD (gastroesophageal reflux disease)    Headache    Hypertension    Sickle cell trait ([place])     Past Surgical History:  Procedure Laterality Date   AV FISTULA PLACEMENT Right 10/10/2016   Procedure: CREATION OF RIGHT ARM RADIOCEPHALIC ARTERIOVENOUS (AV) FISTULA;  Surgeon: Serafina Mitchell, MD;  Location: [facility];  Service: Vascular;  Laterality: Right;   CESAREAN SECTION N/A 10/19/2015   Procedure: CESAREAN SECTION;  Surgeon: Chancy Milroy, MD;  Location: [facility];  Service: Obstetrics;  Laterality: N/A;   lipo suction  2015   TONSILLECTOMY     TONSILLECTOMY AND ADENOIDECTOMY       reports that she has never smoked. She has never used smokeless tobacco. She reports current alcohol use. She reports that she does not use drugs.  Allergies  Allergen Reactions   Ivp Dye [Iodinated Diagnostic Agents] Other (See Comments)    Burning    Propranolol Hcl Er Swelling   Iodine Rash   Nickel Rash    Family History  Problem Relation Age of Onset   Cancer Maternal Grandmother        colon and breast   Hypertension Maternal Grandmother    Breast cancer Maternal Grandmother    Diabetes Maternal Grandfather    Hypertension Maternal Grandfather    Diabetes Mother    Hypertension Mother    Diabetes Father  Hypertension Father    Sickle cell trait Father    Diabetes Paternal Grandmother    Hypertension Paternal Grandmother    Diabetes Paternal Grandfather    Hypertension Paternal Grandfather     Prior to Admission medications   Medication Sig Start Date End Date Taking? Authorizing Provider  ACCU-CHEK FASTCLIX LANCETS MISC As directed 12/09/16   Elayne Snare, MD  amLODipine (NORVASC) 10 MG tablet TAKE ONE TABLET BY MOUTH ONCE DAILY 04/16/16   Ainsley Spinner, Walidah N, CNM  Blood  Glucose Monitoring Suppl (ACCU-CHEK AVIVA PLUS) w/Device KIT Use to check blood sugars 5 times daily Patient taking differently: 1 each by Other route 4 (four) times daily. Use to check blood sugars 3-4 times daily 09/09/16   Elayne Snare, MD  Fremanezumab-vfrm (AJOVY) 225 MG/1.5ML SOSY Inject 225 mg into the skin every 30 (thirty) days. 09/18/17   Marcial Pacas, MD  glucagon (GLUCAGON EMERGENCY) 1 MG injection Inject 1 mg into the skin once as needed for up to 1 dose. Inject 65m into skin once as needed for low blood sugar. 06/03/17   KElayne Snare MD  glucose blood (ACCU-CHEK AVIVA PLUS) test strip Use to check blood sugars four times daily 06/03/17   KElayne Snare MD  insulin aspart (NOVOLOG) 100 UNIT/ML injection Inject 0-20 Units into the skin 3 (three) times daily before meals. Sliding scale 09/10/18   KElayne Snare MD  Insulin Detemir (LEVEMIR FLEXTOUCH) 100 UNIT/ML Pen INJECT 10 UNITS INTO THE SKIN IN THE MORNING AND INJECT 11 UNITS INTO THE SKIN IN THE EVENING. 09/10/18   KElayne Snare MD  losartan (COZAAR) 25 MG tablet Take 50 mg by mouth at bedtime.     [provider]  megestrol (MEGACE) 40 MG tablet Take 1 tablet (40 mg total) by mouth 2 (two) times daily. 01/19/18   BJulianne Handler CNM  metroNIDAZOLE (FLAGYL) 500 MG tablet Take 500 mg by mouth 2 (two) times daily.    [provider]  nortriptyline (PAMELOR) 10 MG capsule TAKE 2 CAPSULES BY MOUTH AT BEDTIME 09/24/18   YMarcial Pacas MD  rizatriptan (MAXALT-MLT) 5 MG disintegrating tablet Take 1 tablet (5 mg total) by mouth as needed. May repeat in 2 hours if needed 05/08/17   YMarcial Pacas MD  sevelamer (RENAGEL) 800 MG tablet Take 2,400 mg by mouth 3 (three) times daily with meals. 3 tablets with each meal and snacks 04/10/17   [provider]  buPROPion (WELLBUTRIN XL) 150 MG 24 hr tablet Take 150 mg daily by mouth.  11/10/18  [provider]    Physical Exam: Vitals:   01/06/19 1700 01/06/19 1730 01/06/19 1800 01/06/19  1845  BP: (!) 148/79 (!) 155/83 (!) 150/76 (!) 157/89  Pulse: 88 88 91 87  Resp: (!) _0 Temp:    99.8 F (37.7 C)  TempSrc:    Oral  SpO2: 95% 94% 95% 99%  Height:        Constitutional: NAD, calm, comfortable, sleepy but arousable and communicating well. Eyes: PERRL, lids and conjunctivae normal ENMT: Mucous membranes are moist. Posterior pharynx clear of any exudate or lesions.Normal dentition.  Neck: normal, supple, no masses, no thyromegaly Respiratory: clear to auscultation bilaterally, no wheezing, no crackles. Normal respiratory effort. No accessory muscle use.  Mid sternal chest wall tenderness positive.  Cardiovascular: Regular rate and rhythm, no murmurs / rubs / gallops. No extremity edema. 2+ pedal pulses. No carotid bruits.  Abdomen: Generalized abdominal tenderness positive, no guarding, no rigidity, no  masses palpated. No hepatosplenomegaly. Bowel sounds positive.  Musculoskeletal: no clubbing / cyanosis. No joint deformity upper and lower extremities. Good ROM, no contractures. Normal muscle tone.  Skin: no rashes, lesions, ulcers. No induration Neurologic: CN 2-12 grossly intact. Sensation intact, DTR normal. Strength 5/5 in all 4.  Psychiatric: Normal judgment and insight. Alert and oriented x 3. Normal mood.    Labs on Admission: I have personally reviewed following labs and imaging studies  CBC: Recent Labs  Lab 01/06/19 1252 01/06/19 1545  WBC 15.7*  --   HGB 12.3 12.6  HCT 36.8 37.0  MCV 101.4*  --   PLT 275  --    Basic Metabolic Panel: Recent Labs  Lab 01/06/19 1252 01/06/19 1545 01/06/19 1635  NA 137 130* 135  K 4.5 5.9* 5.0  CL 89*  --  90*  CO2 24  --  21*  GLUCOSE 329*  --  325*  BUN 27*  --  35*  CREATININE 9.32*  --  10.09*  CALCIUM 8.4*  --  8.1*  MG  --   --  2.3   GFR: CrCl cannot be calculated (Unknown ideal weight.). Liver Function Tests: No results for input(s): AST, ALT, ALKPHOS, BILITOT, PROT, ALBUMIN in the last  168 hours. No results for input(s): LIPASE, AMYLASE in the last 168 hours. No results for input(s): AMMONIA in the last 168 hours. Coagulation Profile: No results for input(s): INR, PROTIME in the last 168 hours. Cardiac Enzymes: No results for input(s): CKTOTAL, CKMB, CKMBINDEX, TROPONINI in the last 168 hours. BNP (last 3 results) No results for input(s): PROBNP in the last 8760 hours. HbA1C: Recent Labs    01/06/19 1638  HGBA1C 8.5*   CBG: Recent Labs  Lab 01/06/19 1509 01/06/19 1636 01/06/19 1802  GLUCAP 350* 296* 157*   Lipid Profile: No results for input(s): CHOL, HDL, LDLCALC, TRIG, CHOLHDL, LDLDIRECT in the last 72 hours. Thyroid Function Tests: Recent Labs    01/06/19 1700  TSH 2.444   Anemia Panel: No results for input(s): VITAMINB12, FOLATE, FERRITIN, TIBC, IRON, RETICCTPCT in the last 72 hours. Urine analysis:    Component Value Date/Time   COLORURINE RED (A) 01/19/2018 0828   APPEARANCEUR HAZY (A) 01/19/2018 0828   LABSPEC  01/19/2018 0828    TEST NOT REPORTED DUE TO COLOR INTERFERENCE OF URINE PIGMENT   PHURINE  01/19/2018 0828    TEST NOT REPORTED DUE TO COLOR INTERFERENCE OF URINE PIGMENT   GLUCOSEU (A) 01/19/2018 0828    TEST NOT REPORTED DUE TO COLOR INTERFERENCE OF URINE PIGMENT   HGBUR (A) 01/19/2018 0828    TEST NOT REPORTED DUE TO COLOR INTERFERENCE OF URINE PIGMENT   BILIRUBINUR (A) 01/19/2018 0828    TEST NOT REPORTED DUE TO COLOR INTERFERENCE OF URINE PIGMENT   KETONESUR (A) 01/19/2018 0828    TEST NOT REPORTED DUE TO COLOR INTERFERENCE OF URINE PIGMENT   PROTEINUR (A) 01/19/2018 0828    TEST NOT REPORTED DUE TO COLOR INTERFERENCE OF URINE PIGMENT   UROBILINOGEN 0.2 03/01/2016 1421   NITRITE (A) 01/19/2018 0828    TEST NOT REPORTED DUE TO COLOR INTERFERENCE OF URINE PIGMENT   LEUKOCYTESUR (A) 01/19/2018 0828    TEST NOT REPORTED DUE TO COLOR INTERFERENCE OF URINE PIGMENT    Radiological Exams on Admission: Ct Abdomen Pelvis Wo  Contrast  Result Date: 01/06/2019 CLINICAL DATA:  Chest pain, nausea, and vomiting after dialysis. Hyperglycemia. EXAM: CT ABDOMEN AND PELVIS WITHOUT CONTRAST TECHNIQUE: Multidetector CT imaging of  the abdomen and pelvis was performed following the standard protocol without IV contrast. COMPARISON:  03/11/2012 FINDINGS: Lower chest: Moderate cardiomegaly. Prominence of lower lobe pulmonary venous structures, query pulmonary venous hypertension. Mild subcutaneous edema, no overt effusions. Mild dependent subsegmental atelectasis. Hepatobiliary: New hypodensity along the falciform ligament measuring 1.9 by 0.9 cm on image 28/3 and possibly spanning across the falciform ligament. Given the location, focal hepatic steatosis is the most likely cause. Pancreas: Unremarkable Spleen: Unremarkable Adrenals/Urinary Tract: Unremarkable Stomach/Bowel: No dilated bowel or discrete bowel abnormality is identified. However, there is mesenteric edema. Vascular/Lymphatic: Mild aortoiliac atherosclerotic vascular disease. Although mild this is advanced for age. Small retroperitoneal lymph nodes are not pathologically enlarged. Reproductive: Uterus 10.2 cm in length. Adnexal contours unremarkable. Other: Trace ascites along the right lower quadrant paracolic gutter. Musculoskeletal: Unremarkable IMPRESSION: 1. Moderate cardiomegaly. Prominence of lower lobe pulmonary venous structures, query pulmonary venous hypertension. 2. New hypodensity along the falciform ligament measuring 1.9 by 0.9 cm in diameter, possibly spanning across the falciform ligament. Given the location, focal hepatic steatosis is the most likely cause. 3. Trace ascites along the right lower quadrant paracolic gutter, with nonspecific mesenteric edema also noted. 4. Mild but advanced for age aortoiliac atherosclerotic vascular disease. Aortic Atherosclerosis (ICD10-I70.0). 5. Mildly enlarged uterus. 6. Mild subcutaneous edema. Electronically Signed   By: Van Clines M.D.   On: 01/06/2019 18:38   Dg Chest 2 View  Result Date: 01/06/2019 CLINICAL DATA:  Chest pain.  Chronic renal failure EXAM: CHEST - 2 VIEW COMPARISON:  February 21, 2017 FINDINGS: The lungs are clear. Heart is mildly enlarged with pulmonary vascularity normal. No adenopathy. No pneumothorax. No bone lesions. IMPRESSION: Lungs clear.  Mild cardiac enlargement.  No adenopathy. Electronically Signed   By: Lowella Grip III M.D.   On: 01/06/2019 13:11    EKG: Normal sinus rhythm, prolonged QT interval, no ST elevation or depression noted.  Assessment/Plan Principal Problem:   DKA (diabetic ketoacidoses) ([place]) Active Problems:   ESRD (end stage renal disease) ([place])   Abdominal pain   HTN (hypertension)   DKA: -Patient presented with nausea, vomiting, elevated blood sugar of 329, anion gap 24. -Admit patient at stepdown unit for close monitoring. -Chest x-ray is negative, troponin negative, D-dimer: WNL, UA/COVID-19 pending. -Patient received 10 units of IV insulin in the ED.  Started on DKA protocol, insulin drip, will give 500 cc of IV fluids of normal saline. Changed IVF to d51/2 NS when BG <250. -Lactic acid, hydroxybutyric acid, and blood culture is pending. -We will keep her n.p.o. Zofran as needed for nausea and vomiting. -Check A1c, monitor BMP every 4 hours and monitor blood glucose closely -On telemetry. -Consulted diabetic cordinator & dietician. -strict I & Os & daily weight-watch for fluid overload-since patient has ESRD.  End-stage renal disease: On hemodialysis -Consulted nephrology Dr. Jonnie Finner -No signs of fluid overload, initial potassium: WNL. -Stat lytes: Potassium 5.9-On  IV insulin which will normalize her potassium-repeat BMP every 4 hours -On telemetry, no T wave changes, monitor potassium level closely.  Abdominal pain nausea and vomiting: -patient is afebrile with leukocytosis of 15.7 -We will get CT abdomen/pelvis without contrast-came back  negative for abscess/colitis-will defer starting Ab at this time.  Chest pain: Likely secondary to musculoskeletal in origin -Chest wall tenderness positive on exam.  Troponin negative, will trend, EKG: No acute changes.  Hypertension: -We will hold patient's home medication-amlodipine, losartan, metoprolol as patient is n.p.o. -On hydralazine as needed for elevated blood pressure.  Macrocytosis:  -  We will check TSH, Z83 and folic acid level -H&H is stable.  DVT prophylaxis: TED/SCD/Heparin Code Status: Full code Family Communication: None present at bedside.  Plan of care discussed with patient in length and she verbalized understanding and agreed with it. Disposition Plan: TBD Consults called: Nephrology-Dr. Jonnie Finner Admission status: Inpatient at the stepdown unit  Mckinley Jewel MD [facility] Pager [phone]  If 7PM-7AM, please contact night-coverage [email or web address] Password [facility]  01/06/2019, 7:32 PM

## 2019-01-06 NOTE — ED Notes (Signed)
ED TO INPATIENT HANDOFF REPORT  ED Nurse Name and Phone #: Lorrin Goodell [phone]  S Name/Age/Gender Christine Reed 33 y.o. female Room/Bed: 035C/035C  Code Status   Code Status: Full Code  Home/SNF/Other Home Patient oriented to: self, place, time and situation Is this baseline? Yes   Triage Complete: Triage complete  Chief Complaint cp after dialysis  Triage Note Pt reports centralized chest pain that radiates through to her back that started during dialysis, pt had to cut her treatment short today due to the pain, she had 30 minutes left in her session. Pt also states she vomited twice today when she got home. Pt a.o, nad noted.    Allergies Allergies  Allergen Reactions  . Ivp Dye [Iodinated Diagnostic Agents] Other (See Comments)    Burning   . Propranolol Hcl Er Swelling  . Iodine Rash  . Nickel Rash    Level of Care/Admitting Diagnosis ED Disposition    ED Disposition Condition Comment   Admit  Hospital Area: [facility] [[id number]]  Level of Care: Progressive [102]  Admit to Progressive based on following criteria: GI, ENDOCRINE disease patients with GI bleeding, acute liver failure or pancreatitis, stable with diabetic ketoacidosis or thyrotoxicosis (hypothyroid) state.  Covid Evaluation: Asymptomatic Screening Protocol (No Symptoms)  Diagnosis: DKA (diabetic ketoacidoses) [facility]) [id number]  Admitting Physician: Mckinley Jewel [phone]  Attending Physician: Mckinley Jewel [phone]  Estimated length of stay: past midnight tomorrow  Certification:: I certify this patient will need inpatient services for at least 2 midnights  PT Class (Do Not Modify): Inpatient [101]  PT Acc Code (Do Not Modify): Private [1]       B Medical/Surgery History Past Medical History:  Diagnosis Date  . Anemia   . Chronic kidney disease    Pt is on dialysis  . Depression   . Diabetes mellitus    diagnosed at age 30; Type 1  . Diabetic retinopathy ([place])   . GERD  (gastroesophageal reflux disease)   . Headache   . Hypertension   . Sickle cell trait [facility])    Past Surgical History:  Procedure Laterality Date  . AV FISTULA PLACEMENT Right 10/10/2016   Procedure: CREATION OF RIGHT ARM RADIOCEPHALIC ARTERIOVENOUS (AV) FISTULA;  Surgeon: Serafina Mitchell, MD;  Location: [place];  Service: Vascular;  Laterality: Right;  . CESAREAN SECTION N/A 10/19/2015   Procedure: CESAREAN SECTION;  Surgeon: Chancy Milroy, MD;  Location: [place];  Service: Obstetrics;  Laterality: N/A;  . lipo suction  2015  . TONSILLECTOMY    . TONSILLECTOMY AND ADENOIDECTOMY       A IV Location/Drains/Wounds Patient Lines/Drains/Airways Status   Active Line/Drains/Airways    Name:   Placement date:   Placement time:   Site:   Days:   Peripheral IV 01/06/19 Left Antecubital   01/06/19    1320    Antecubital   less than 1   Peripheral IV 01/06/19 Left Arm   01/06/19    1654    Arm   less than 1   Fistula / Graft Right Forearm Arteriovenous fistula   10/10/16    0807    Forearm   818   Hemodialysis Catheter Right Subclavian Temporary;Double-lumen   10/11/16    0949    Subclavian   817   Incision (Closed) 10/19/15 Abdomen Other (Comment)   10/19/15    1553     1175   Incision (Closed) 10/19/15 Perineum Other (Comment)   10/19/15  1553     1175   Incision (Closed) 12/26/15 Flank Right   12/26/15    0914     1107   Incision (Closed) 10/10/16 Arm Right   10/10/16    0806     818          Intake/Output Last 24 hours No intake or output data in the 24 hours ending 01/06/19 1804  Labs/Imaging Results for orders placed or performed during the hospital encounter of 01/06/19 (from the past 48 hour(s))  Basic metabolic panel     Status: Abnormal   Collection Time: 01/06/19 12:52 PM  Result Value Ref Range   Sodium 137 135 - 145 mmol/L   Potassium 4.5 3.5 - 5.1 mmol/L   Chloride 89 (L) 98 - 111 mmol/L   CO2 24 22 - 32 mmol/L   Glucose, Bld 329 (H) 70 - 99 mg/dL   BUN 27  (H) 6 - 20 mg/dL   Creatinine, Ser 9.32 (H) 0.44 - 1.00 mg/dL   Calcium 8.4 (L) 8.9 - 10.3 mg/dL   GFR calc non Af Amer 5 (L) >60 mL/min   GFR calc Af Amer 6 (L) >60 mL/min   Anion gap 24 (H) 5 - 15    Comment: Performed at [facility], [address] [place], [place] [address]  CBC     Status: Abnormal   Collection Time: 01/06/19 12:52 PM  Result Value Ref Range   WBC 15.7 (H) 4.0 - 10.5 K/uL   RBC 3.63 (L) 3.87 - 5.11 MIL/uL   Hemoglobin 12.3 12.0 - 15.0 g/dL   HCT 36.8 36.0 - 46.0 %   MCV 101.4 (H) 80.0 - 100.0 fL   MCH 33.9 26.0 - 34.0 pg   MCHC 33.4 30.0 - 36.0 g/dL   RDW 15.3 11.5 - 15.5 %   Platelets 275 150 - 400 K/uL   nRBC 0.0 0.0 - 0.2 %    Comment: Performed at [facility], [place] [address], [place], [place] [address]  Troponin I (High Sensitivity)     Status: None   Collection Time: 01/06/19 12:52 PM  Result Value Ref Range   Troponin I (High Sensitivity) 10 <18 ng/L    Comment: (NOTE) Elevated high sensitivity troponin I (hsTnI) values and significant  changes across serial measurements may suggest ACS but many other  chronic and acute conditions are known to elevate hsTnI results.  Refer to the Links section for chest pain algorithms and additional  guidance. Performed at [facility], [place] [address] [place], [place] [address]   I-Stat beta hCG blood, ED     Status: None   Collection Time: 01/06/19 12:56 PM  Result Value Ref Range   I-stat hCG, quantitative <5.0 <5 mIU/mL   Comment 3            Comment:   GEST. AGE      CONC.  (mIU/mL)   <=1 WEEK        5 - 50     2 WEEKS       50 - 500     3 WEEKS       100 - 10,000     4 WEEKS     1,000 - 30,000        FEMALE AND NON-PREGNANT FEMALE:     LESS THAN 5 mIU/mL   D-dimer, quantitative (not at [facility])     Status: None   Collection Time: 01/06/19  2:12 PM  Result Value Ref Range   D-Dimer, Quant 0.39 0.00 - 0.50 ug/mL-FEU    Comment: (NOTE) At the manufacturer cut-off of 0.50 ug/mL FEU,  this assay has been documented to exclude PE with a sensitivity and negative predictive value of 97 to 99%.  At this time, this assay has not been approved by the FDA to exclude DVT/VTE. Results should be correlated with clinical presentation. Performed at [facility], [place] [address], [place], [place] [address]   CBG monitoring, ED     Status: Abnormal   Collection Time: 01/06/19  3:09 PM  Result Value Ref Range   Glucose-Capillary 350 (H) 70 - 99 mg/dL   Comment 1 Notify RN    Comment 2 Document in Chart   I-STAT 7, (LYTES, BLD GAS, ICA, H+H)     Status: Abnormal   Collection Time: 01/06/19  3:45 PM  Result Value Ref Range   pH, Arterial 7.427 7.350 - 7.450   pCO2 arterial 34.5 32.0 - 48.0 mmHg   pO2, Arterial 90.0 83.0 - 108.0 mmHg   Bicarbonate 22.7 20.0 - 28.0 mmol/L   TCO2 24 22 - 32 mmol/L   O2 Saturation 97.0 %   Acid-base deficit 1.0 0.0 - 2.0 mmol/L   Sodium 130 (L) 135 - 145 mmol/L   Potassium 5.9 (H) 3.5 - 5.1 mmol/L   Calcium, Ion 0.92 (L) 1.15 - 1.40 mmol/L   HCT 37.0 36.0 - 46.0 %   Hemoglobin 12.6 12.0 - 15.0 g/dL   Patient temperature 98.6 F    Collection site RADIAL, ALLEN'S TEST ACCEPTABLE    Drawn by RT    Sample type ARTERIAL   Troponin I (High Sensitivity)     Status: None   Collection Time: 01/06/19  3:50 PM  Result Value Ref Range   Troponin I (High Sensitivity) 8 <18 ng/L    Comment: (NOTE) Elevated high sensitivity troponin I (hsTnI) values and significant  changes across serial measurements may suggest ACS but many other  chronic and acute conditions are known to elevate hsTnI results.  Refer to the "Links" section for chest pain algorithms and additional  guidance. Performed at [facility], [place] [address] [place], [place] [address]   Lactic acid, plasma     Status: None   Collection Time: 01/06/19  4:35 PM  Result Value Ref Range   Lactic Acid, Venous 1.9 0.5 - 1.9 mmol/L    Comment: Performed at [place] [address] [place], [place] [id number]  Basic metabolic panel     Status: Abnormal   Collection Time: 01/06/19  4:35 PM  Result Value Ref Range   Sodium 135 135 - 145 mmol/L   Potassium 5.0 3.5 - 5.1 mmol/L   Chloride 90 (L) 98 - 111 mmol/L   CO2 21 (L) 22 - 32 mmol/L   Glucose, Bld 325 (H) 70 - 99 mg/dL   BUN 35 (H) 6 - 20 mg/dL   Creatinine, Ser 10.09 (H) 0.44 - 1.00 mg/dL   Calcium 8.1 (L) 8.9 - 10.3 mg/dL   GFR calc non Af Amer 5 (L) >60 mL/min   GFR calc Af Amer 5 (L) >60 mL/min   Anion gap 24 (H) 5 - 15    Comment: Performed at [facility] [address] [place],  [address]  Magnesium     Status: None   Collection Time: 01/06/19  4:35 PM  Result Value Ref Range  Magnesium 2.3 1.7 - 2.4 mg/dL    Comment: Performed at [facility], [place] [address], [place], [place] [address]  CBG monitoring, ED     Status: Abnormal   Collection Time: 01/06/19  4:36 PM  Result Value Ref Range   Glucose-Capillary 296 (H) 70 - 99 mg/dL  Hemoglobin A1c     Status: Abnormal   Collection Time: 01/06/19  4:38 PM  Result Value Ref Range   Hgb A1c MFr Bld 8.5 (H) 4.8 - 5.6 %    Comment: REPEATED TO VERIFY (NOTE) Pre diabetes:          5.7%-6.4% Diabetes:              >6.4% Glycemic control for   <7.0% adults with diabetes    Mean Plasma Glucose 197.25 mg/dL    Comment: Performed at [facility] [address] [place], [place] [address]  CBG monitoring, ED     Status: Abnormal   Collection Time: 01/06/19  6:02 PM  Result Value Ref Range   Glucose-Capillary 157 (H) 70 - 99 mg/dL   Comment 1 Notify RN    Comment 2 Document in Chart    Dg Chest 2 View  Result Date: 01/06/2019 CLINICAL DATA:  Chest pain.  Chronic renal failure EXAM: CHEST - 2 VIEW COMPARISON:  February 21, 2017 FINDINGS: The lungs are clear. Heart is mildly enlarged with pulmonary vascularity normal. No adenopathy. No pneumothorax. No bone lesions. IMPRESSION: Lungs clear.  Mild cardiac enlargement.  No  adenopathy. Electronically Signed   By: Lowella Grip III M.D.   On: 01/06/2019 13:11    Pending Labs Unresulted Labs (From admission, onward)    Start     Ordered   01/06/19 1730  Folate RBC  Once,   STAT     01/06/19 1730   01/06/19 1700  CBC  Once,   STAT     01/06/19 1700   01/06/19 1638  TSH  Add-on,   AD     01/06/19 1637   01/06/19 1638  Vitamin B12  Add-on,   AD     01/06/19 1637   01/06/19 [id number]  Basic metabolic panel  (Diabetes Ketoacidosis (DKA))  STAT Now then every 4 hours ,   STAT     01/06/19 1609   01/06/19 1603  Beta-hydroxybutyric acid  (Diabetes Ketoacidosis (DKA))  Now then every 8 hours,   R (with STAT occurrences)     01/06/19 1609   01/06/19 1601  HIV Antibody (routine testing w rflx)  (HIV Antibody (Routine testing w reflex) panel)  Once,   STAT     01/06/19 1609   01/06/19 1546  Blood culture (routine x 2)  BLOOD CULTURE X 2,   STAT     01/06/19 1545   01/06/19 1545  SARS CORONAVIRUS 2 (TAT 6-24 HRS) Nasopharyngeal Nasopharyngeal Swab  (Asymptomatic/Tier 3)  Once,   STAT    Question Answer Comment  Is this test for diagnosis or screening Screening   Symptomatic for COVID-19 as defined by CDC No   Hospitalized for COVID-19 No   Admitted to ICU for COVID-19 No   Previously tested for COVID-19 No   Resident in a congregate (group) care setting No   Employed in healthcare setting No   Pregnant No      01/06/19 1545   01/06/19 1513  Urinalysis, Routine w reflex microscopic  Once,   STAT     01/06/19 1512  Vitals/Pain Today's Vitals   01/06/19 1600 01/06/19 1700 01/06/19 1730 01/06/19 1800  BP: (!) 141/72 (!) 148/79 (!) 155/83 (!) 150/76  Pulse: 81 88 88 91  Resp: 16 (!) 22 18 16   Temp:      TempSrc:      SpO2: 98% 95% 94% 95%  Height:      PainSc:        Isolation Precautions No active isolations  Medications Medications  sodium chloride flush (NS) 0.9 % injection 3 mL (has no administration in time range)  acetaminophen  (TYLENOL) tablet 650 mg (650 mg Oral Not Given 01/06/19 1525)  heparin injection 5,000 Units (5,000 Units Subcutaneous Given 01/06/19 1648)  insulin regular, human (MYXREDLIN) 100 units/ 100 mL infusion (2.6 Units/hr Intravenous New Bag/Given 01/06/19 1708)  dextrose 50 % solution 0-50 mL (has no administration in time range)  ondansetron (ZOFRAN) injection 4 mg (has no administration in time range)  0.9 %  sodium chloride infusion (has no administration in time range)  hydrALAZINE (APRESOLINE) injection 10 mg (has no administration in time range)  ondansetron (ZOFRAN) injection 4 mg (4 mg Intravenous Given 01/06/19 1350)  alum & mag hydroxide-simeth (MAALOX/MYLANTA) 200-200-20 MG/5ML suspension 30 mL (30 mLs Oral Given 01/06/19 1419)    And  lidocaine (XYLOCAINE) 2 % viscous mouth solution 15 mL (15 mLs Oral Given 01/06/19 1420)  promethazine (PHENERGAN) injection 12.5 mg (12.5 mg Intravenous Given 01/06/19 1516)  insulin aspart (novoLOG) injection 10 Units (10 Units Subcutaneous Given 01/06/19 1518)  sodium chloride 0.9 % bolus 1,000 mL (1,000 mLs Intravenous New Bag/Given 01/06/19 1648)    Mobility walks Low fall risk   Focused Assessments    R Recommendations: See Admitting Provider Note  Report given to:   Additional Notes:

## 2019-01-06 NOTE — ED Provider Notes (Addendum)
[place] EMERGENCY DEPARTMENT Provider Note   CSN: [id number] Arrival date & time: 01/06/19  1233     History   Chief Complaint Chief Complaint  Patient presents with  . Chest Pain  . Emesis    HPI Christine Reed is a 33 y.o. female.  HPI: A 33 year old patient with a history of treated diabetes, hypertension and obesity presents for evaluation of chest pain. Initial onset of pain was more than 6 hours ago. The patient's chest pain is described as heaviness/pressure/tightness, is sharp and is worse with exertion. The patient's chest pain is middle- or left-sided, is not well-localized and does not radiate to the arms/jaw/neck. The patient does not complain of nausea and denies diaphoresis. The patient has no history of stroke, has no history of peripheral artery disease, has not smoked in the past 90 days, has no relevant family history of coronary artery disease (first degree relative at less than age 79) and has no history of hypercholesterolemia.   HPI  Past Medical History:  Diagnosis Date  . Anemia   . Chronic kidney disease    Pt is on dialysis  . Depression   . Diabetes mellitus    diagnosed at age 37; Type 1  . Diabetic retinopathy ([place])   . GERD (gastroesophageal reflux disease)   . Headache   . Hypertension   . Sickle cell trait [facility])     Patient Active Problem List   Diagnosis Date Noted  . ESRD (end stage renal disease) ([place]) 01/06/2019  . Abdominal pain 01/06/2019  . HTN (hypertension) 01/06/2019  . Chronic migraine 05/08/2017  . Acute intractable headache 05/08/2017  . Sepsis ([place]) 02/22/2017  . ESRD on hemodialysis ([place]) 02/22/2017  . Uremia 02/22/2017  . DKA (diabetic ketoacidoses) ([place]) 02/22/2017  . Hyperkalemia   . DKA, type 1 ([place]) 03/01/2016  . Nausea & vomiting 03/01/2016  . Flu-like symptoms 03/01/2016  . Nephrotic syndrome 10/17/2015  . Ascites 10/17/2015  . Symptomatic anemia 09/11/2015  . Anemia affecting pregnancy  in second trimester, antepartum 08/12/2015  . Diabetic nephropathy ([place]) 08/07/2015  . CKD (chronic kidney disease) stage 3, GFR 30-59 ml/min 08/07/2015  . Diabetic retinopathy ([place]) 07/24/2015  . Chronic hypertension during pregnancy, antepartum 07/22/2015  . Anemia, chronic disease 07/17/2015  . T1DM - Type F 06/16/2015  . Supervision of high risk pregnancy, antepartum 06/16/2015  . Type 1 diabetes mellitus with ketoacidosis without coma ([place]) 02/19/2012    Past Surgical History:  Procedure Laterality Date  . AV FISTULA PLACEMENT Right 10/10/2016   Procedure: CREATION OF RIGHT ARM RADIOCEPHALIC ARTERIOVENOUS (AV) FISTULA;  Surgeon: Serafina Mitchell, MD;  Location: [place];  Service: Vascular;  Laterality: Right;  . CESAREAN SECTION N/A 10/19/2015   Procedure: CESAREAN SECTION;  Surgeon: Chancy Milroy, MD;  Location: [place];  Service: Obstetrics;  Laterality: N/A;  . lipo suction  2015  . TONSILLECTOMY    . TONSILLECTOMY AND ADENOIDECTOMY       OB History    Gravida  1   Para  1   Term      Preterm  1   AB      Living  1     SAB      TAB      Ectopic      Multiple  0   Live Births               Home Medications    Prior to Admission  medications   Medication Sig Start Date End Date Taking? Authorizing Provider  ACCU-CHEK FASTCLIX LANCETS [place] As directed 12/09/16  Yes Elayne Snare, MD  glucose blood (ACCU-CHEK AVIVA PLUS) test strip Use to check blood sugars four times daily 06/03/17  Yes Elayne Snare, MD  insulin aspart (NOVOLOG) 100 UNIT/ML injection Inject 0-20 Units into the skin 3 (three) times daily before meals. Sliding scale 09/10/18  Yes Elayne Snare, MD  Insulin Detemir (LEVEMIR FLEXTOUCH) 100 UNIT/ML Pen INJECT 10 UNITS INTO THE SKIN IN THE MORNING AND INJECT 11 UNITS INTO THE SKIN IN THE EVENING. Patient taking differently: Inject 10-11 Units into the skin See admin instructions. INJECT 10 UNITS IN THE MORNING AND 11 UNITS IN THE EVENING.  09/10/18  Yes Elayne Snare, MD  losartan (COZAAR) 50 MG tablet Take 50 mg by mouth at bedtime. 12/11/18  Yes [provider]  megestrol (MEGACE) 40 MG tablet Take 1 tablet (40 mg total) by mouth 2 (two) times daily. 01/19/18  Yes Julianne Handler, CNM  metoprolol succinate (TOPROL-XL) 50 MG 24 hr tablet Take 50 mg by mouth daily. 07/22/18  Yes [provider]  nortriptyline (PAMELOR) 10 MG capsule TAKE 2 CAPSULES BY MOUTH AT BEDTIME Patient taking differently: Take 20 mg by mouth at bedtime.  09/24/18  Yes Marcial Pacas, MD  sevelamer (RENAGEL) 800 MG tablet Take 2,400 mg by mouth 3 (three) times daily with meals. 3 tablets with each meal and snacks 04/10/17  Yes [provider]  amLODipine (NORVASC) 10 MG tablet Take 1 tablet (10 mg total) by mouth daily. 01/07/19   Aline August, MD  Blood Glucose Monitoring Suppl (ACCU-CHEK AVIVA PLUS) w/Device KIT 1 each by Other route 4 (four) times daily. Use to check blood sugars 3-4 times daily 01/07/19   Aline August, MD  Fremanezumab-vfrm (AJOVY) 225 MG/1.5ML SOSY Inject 225 mg into the skin every 30 (thirty) days. Patient not taking: Reported on 01/06/2019 09/18/17   Marcial Pacas, MD  glucagon (GLUCAGON EMERGENCY) 1 MG injection Inject 1 mg into the skin once as needed for up to 1 dose. Inject 67m into skin once as needed for low blood sugar. Patient not taking: Reported on 01/06/2019 06/03/17   KElayne Snare MD  ondansetron (ZOFRAN) 4 MG tablet Take 1 tablet (4 mg total) by mouth daily as needed for nausea or vomiting. 01/07/19   AAline August MD  rizatriptan (MAXALT-MLT) 5 MG disintegrating tablet Take 1 tablet (5 mg total) by mouth as needed. May repeat in 2 hours if needed Patient not taking: Reported on 01/06/2019 05/08/17   YMarcial Pacas MD  buPROPion (WELLBUTRIN XL) 150 MG 24 hr tablet Take 150 mg daily by mouth.  11/10/18  [provider]    Family History Family History  Problem Relation Age of Onset  . Cancer Maternal  Grandmother        colon and breast  . Hypertension Maternal Grandmother   . Breast cancer Maternal Grandmother   . Diabetes Maternal Grandfather   . Hypertension Maternal Grandfather   . Diabetes Mother   . Hypertension Mother   . Diabetes Father   . Hypertension Father   . Sickle cell trait Father   . Diabetes Paternal Grandmother   . Hypertension Paternal Grandmother   . Diabetes Paternal Grandfather   . Hypertension Paternal Grandfather     Social History Social History   Tobacco Use  . Smoking status: Never Smoker  . Smokeless tobacco: Never Used  Substance Use Topics  .  Alcohol use: Yes    Comment: very rarely  . Drug use: No     Allergies   Ivp dye [iodinated diagnostic agents], Propranolol hcl er, Iodine, and Nickel   Review of Systems Review of Systems  Constitutional: Negative for fatigue and fever.  HENT: Negative for sore throat.   Respiratory: Positive for shortness of breath.   Cardiovascular: Positive for chest pain. Negative for palpitations and leg swelling.  Gastrointestinal: Positive for nausea and vomiting.  Genitourinary: Negative for dysuria.  Neurological: Negative for dizziness and light-headedness.     Physical Exam Updated Vital Signs BP (!) 157/84 (BP Location: Right Wrist)   Pulse 88   Temp 98.6 F (37 C) (Oral)   Resp 15   Ht '5\' 5"'  (1.651 m)   LMP 04/07/2018 (Exact Date) Comment: PT was shielded, irregular periods.   SpO2 100%   BMI 11.32 kg/m   Physical Exam Vitals and nursing note reviewed.  Constitutional:      General: She is not in acute distress.    Appearance: Normal appearance. She is obese. She is not ill-appearing, toxic-appearing or diaphoretic.  HENT:     Head: Normocephalic.  Eyes:     Conjunctiva/sclera: Conjunctivae normal.  Cardiovascular:     Rate and Rhythm: Tachycardia present.  Pulmonary:     Effort: Pulmonary effort is normal.     Breath sounds: Normal breath sounds.  Chest:     Chest wall: No  edema.  Musculoskeletal:     Right lower leg: No edema.     Left lower leg: No edema.  Skin:    General: Skin is dry.  Neurological:     General: No focal deficit present.     Mental Status: She is alert.  Psychiatric:        Mood and Affect: Mood normal.      ED Treatments / Results  Labs (all labs ordered are listed, but only abnormal results are displayed) Labs Reviewed  BASIC METABOLIC PANEL - Abnormal; Notable for the following components:      Result Value   Chloride 89 (*)    Glucose, Bld 329 (*)    BUN 27 (*)    Creatinine, Ser 9.32 (*)    Calcium 8.4 (*)    GFR calc non Af Amer 5 (*)    GFR calc Af Amer 6 (*)    Anion gap 24 (*)    All other components within normal limits  CBC - Abnormal; Notable for the following components:   WBC 15.7 (*)    RBC 3.63 (*)    MCV 101.4 (*)    All other components within normal limits  BASIC METABOLIC PANEL - Abnormal; Notable for the following components:   Chloride 90 (*)    CO2 21 (*)    Glucose, Bld 325 (*)    BUN 35 (*)    Creatinine, Ser 10.09 (*)    Calcium 8.1 (*)    GFR calc non Af Amer 5 (*)    GFR calc Af Amer 5 (*)    Anion gap 24 (*)    All other components within normal limits  BASIC METABOLIC PANEL - Abnormal; Notable for the following components:   Chloride 97 (*)    Glucose, Bld 105 (*)    BUN 38 (*)    Creatinine, Ser 10.43 (*)    Calcium 7.7 (*)    GFR calc non Af Amer 4 (*)    GFR calc Af Amer 5 (*)  Anion gap 19 (*)    All other components within normal limits  BASIC METABOLIC PANEL - Abnormal; Notable for the following components:   Chloride 91 (*)    CO2 21 (*)    Glucose, Bld 254 (*)    BUN 39 (*)    Creatinine, Ser 10.70 (*)    Calcium 7.7 (*)    GFR calc non Af Amer 4 (*)    GFR calc Af Amer 5 (*)    Anion gap 23 (*)    All other components within normal limits  BASIC METABOLIC PANEL - Abnormal; Notable for the following components:   Chloride 94 (*)    Glucose, Bld 164 (*)     BUN 43 (*)    Creatinine, Ser 11.60 (*)    Calcium 7.7 (*)    GFR calc non Af Amer 4 (*)    GFR calc Af Amer 4 (*)    Anion gap 16 (*)    All other components within normal limits  BETA-HYDROXYBUTYRIC ACID - Abnormal; Notable for the following components:   Beta-Hydroxybutyric Acid 3.14 (*)    All other components within normal limits  BETA-HYDROXYBUTYRIC ACID - Abnormal; Notable for the following components:   Beta-Hydroxybutyric Acid 6.01 (*)    All other components within normal limits  HEMOGLOBIN A1C - Abnormal; Notable for the following components:   Hgb A1c MFr Bld 8.5 (*)    All other components within normal limits  CBC - Abnormal; Notable for the following components:   WBC 13.3 (*)    RBC 3.31 (*)    Hemoglobin 11.2 (*)    HCT 33.2 (*)    MCV 100.3 (*)    All other components within normal limits  FOLATE RBC - Abnormal; Notable for the following components:   Hematocrit 33.9 (*)    All other components within normal limits  BASIC METABOLIC PANEL - Abnormal; Notable for the following components:   Chloride 97 (*)    Glucose, Bld 103 (*)    BUN 44 (*)    Creatinine, Ser 11.86 (*)    Calcium 7.8 (*)    GFR calc non Af Amer 4 (*)    GFR calc Af Amer 4 (*)    All other components within normal limits  BETA-HYDROXYBUTYRIC ACID - Abnormal; Notable for the following components:   Beta-Hydroxybutyric Acid 0.93 (*)    All other components within normal limits  GLUCOSE, CAPILLARY - Abnormal; Notable for the following components:   Glucose-Capillary 107 (*)    All other components within normal limits  GLUCOSE, CAPILLARY - Abnormal; Notable for the following components:   Glucose-Capillary 160 (*)    All other components within normal limits  GLUCOSE, CAPILLARY - Abnormal; Notable for the following components:   Glucose-Capillary 232 (*)    All other components within normal limits  GLUCOSE, CAPILLARY - Abnormal; Notable for the following components:   Glucose-Capillary  254 (*)    All other components within normal limits  GLUCOSE, CAPILLARY - Abnormal; Notable for the following components:   Glucose-Capillary 256 (*)    All other components within normal limits  GLUCOSE, CAPILLARY - Abnormal; Notable for the following components:   Glucose-Capillary 187 (*)    All other components within normal limits  GLUCOSE, CAPILLARY - Abnormal; Notable for the following components:   Glucose-Capillary 167 (*)    All other components within normal limits  GLUCOSE, CAPILLARY - Abnormal; Notable for the following components:   Glucose-Capillary 151 (*)  All other components within normal limits  GLUCOSE, CAPILLARY - Abnormal; Notable for the following components:   Glucose-Capillary 113 (*)    All other components within normal limits  GLUCOSE, CAPILLARY - Abnormal; Notable for the following components:   Glucose-Capillary 114 (*)    All other components within normal limits  GLUCOSE, CAPILLARY - Abnormal; Notable for the following components:   Glucose-Capillary 103 (*)    All other components within normal limits  GLUCOSE, CAPILLARY - Abnormal; Notable for the following components:   Glucose-Capillary 101 (*)    All other components within normal limits  GLUCOSE, CAPILLARY - Abnormal; Notable for the following components:   Glucose-Capillary 109 (*)    All other components within normal limits  GLUCOSE, CAPILLARY - Abnormal; Notable for the following components:   Glucose-Capillary 108 (*)    All other components within normal limits  CBG MONITORING, ED - Abnormal; Notable for the following components:   Glucose-Capillary 350 (*)    All other components within normal limits  POCT I-STAT 7, (LYTES, BLD GAS, ICA,H+H) - Abnormal; Notable for the following components:   Sodium 130 (*)    Potassium 5.9 (*)    Calcium, Ion 0.92 (*)    All other components within normal limits  CBG MONITORING, ED - Abnormal; Notable for the following components:    Glucose-Capillary 296 (*)    All other components within normal limits  CBG MONITORING, ED - Abnormal; Notable for the following components:   Glucose-Capillary 157 (*)    All other components within normal limits  SARS CORONAVIRUS 2 (TAT 6-24 HRS)  CULTURE, BLOOD (ROUTINE X 2)  CULTURE, BLOOD (ROUTINE X 2) W REFLEX TO ID PANEL  D-DIMER, QUANTITATIVE (NOT AT [facility])  LACTIC ACID, PLASMA  HIV ANTIBODY (ROUTINE TESTING W REFLEX)  MAGNESIUM  TSH  VITAMIN B12  LIPASE, BLOOD  GLUCOSE, CAPILLARY  GLUCOSE, CAPILLARY  I-STAT BETA HCG BLOOD, ED ([facility], [facility], [facility] ONLY)  I-STAT ARTERIAL BLOOD GAS, ED  TROPONIN I (HIGH SENSITIVITY)  TROPONIN I (HIGH SENSITIVITY)  TROPONIN I (HIGH SENSITIVITY)    EKG EKG Interpretation  Date/Time:  Wednesday January 06 2019 12:41:38 EST Ventricular Rate:  82 PR Interval:  154 QRS Duration: 78 QT Interval:  444 QTC Calculation: 518 R Axis:   20 Text Interpretation: Normal sinus rhythm Possible Left atrial enlargement Prolonged QT Abnormal ECG Confirmed by Pattricia Boss [phone]) on 01/06/2019 4:01:51 PM   Radiology No results found.  Procedures Procedures (including critical care time)  Medications Ordered in ED Medications  ondansetron (ZOFRAN) injection 4 mg (4 mg Intravenous Given 01/06/19 1350)  alum & mag hydroxide-simeth (MAALOX/MYLANTA) 200-200-20 MG/5ML suspension 30 mL (30 mLs Oral Given 01/06/19 1419)    And  lidocaine (XYLOCAINE) 2 % viscous mouth solution 15 mL (15 mLs Oral Given 01/06/19 1420)  promethazine (PHENERGAN) injection 12.5 mg (12.5 mg Intravenous Given 01/06/19 1516)  insulin aspart (novoLOG) injection 10 Units (10 Units Subcutaneous Given 01/06/19 1518)  sodium chloride 0.9 % bolus 1,000 mL (1,000 mLs Intravenous New Bag/Given 01/06/19 1648)     Initial Impression / Assessment and Plan / ED Course  I have reviewed the triage vital signs and the nursing notes.  Pertinent labs & imaging results that were available during my  care of the patient were reviewed by me and considered in my medical decision making (see chart for details).  Clinical Course as of Jan 26 1020  Wed Jan 06, 2019  1350 Dialysis patient coming to ED  for acute onset CP, SOB during dialysis tx. Hx of similar in the past during dialysis as well. Described as "acid reflux". Initially tachycardic to 116 , afebrile. Hx DKA. Wokring up for the same. Case passed to Gust Brooms due to change of shift   [KM]  [place] with Dr. Tamala Julian to admit patient for DKA.   [KM]    Clinical Course User Index [KM] Alveria Apley, PA-C      Final Clinical Impressions(s) / ED Diagnoses   Final diagnoses:  Diabetic ketoacidosis without coma associated with type 1 diabetes mellitus [facility])    ED Discharge Orders         Ordered    amLODipine (NORVASC) 10 MG tablet  Daily    Note to Pharmacy: Please consider 90 day supplies to promote better adherence   01/07/19 1249    Blood Glucose Monitoring Suppl (ACCU-CHEK AVIVA PLUS) w/Device KIT  4 times daily     01/07/19 1249    ondansetron (ZOFRAN) 4 MG tablet  Daily PRN     01/07/19 1249    Increase activity slowly     01/07/19 1249    Diet - low sodium heart healthy     01/07/19 1249    Diet Carb Modified     01/07/19 1249           Kristine Royal 01/06/19 1553    Pattricia Boss, MD 01/06/19 1611    Alveria Apley, PA-C 01/26/19 1021    Pattricia Boss, MD 01/29/19 1212

## 2019-01-06 NOTE — ED Notes (Signed)
Pt vomited right after drinking the maalox and lidocaine.

## 2019-01-06 NOTE — ED Notes (Signed)
PAGED ADMITTING PER RN  

## 2019-01-06 NOTE — ED Provider Notes (Addendum)
[facility] EMERGENCY DEPARTMENT Provider Note   CSN: [id number] Arrival date & time: 01/06/19  1233     History   Chief Complaint Chief Complaint  Patient presents with   Chest Pain   Emesis    HPI Christine Reed is a 33 y.o. female.  HPI: A 33 year old patient with a history of treated diabetes, hypertension and obesity presents for evaluation of chest pain. Initial onset of pain was more than 6 hours ago. The patient's chest pain is described as heaviness/pressure/tightness, is sharp and is worse with exertion. The patient's chest pain is middle- or left-sided, is not well-localized and does not radiate to the arms/jaw/neck. The patient does not complain of nausea and denies diaphoresis. The patient has no history of stroke, has no history of peripheral artery disease, has not smoked in the past 90 days, has no relevant family history of coronary artery disease (first degree relative at less than age 2) and has no history of hypercholesterolemia.   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  Patient reports that this started acutely around 9:30 AM while she was in dialysis.  She had to stop her dialysis session 30 minutes early.  Reports that the pain is rated 6 out of 10 and feels like "acid reflux" and radiates into her back.  She reports that she has felt this similar pain before in the past during the last hour of her dialysis but it has never lasted this long before and it has not been this painful before.  Reports that she did have 2 episodes of vomiting when she went home and she decided to come to the emergency department when the pain persisted.  She has not tried anything for relief.  Reports some associated shortness of breath.  Denies any leg swelling, cough, fever.  Denies recent travel, recent surgery, smoking, hormone use.  Denies family history of significant  cardiac disease.     Past Medical History:  Diagnosis Date   Anemia    Chronic kidney disease    Pt is on dialysis   Depression    Diabetes mellitus    diagnosed at age 34; Type 1   Diabetic retinopathy ([place])    GERD (gastroesophageal reflux disease)    Headache    Hypertension    Sickle cell trait ([place])     Patient Active Problem List   Diagnosis Date Noted   ESRD (end stage renal disease) ([place]) 01/06/2019   Abdominal pain 01/06/2019   HTN (hypertension) 01/06/2019   Chronic migraine 05/08/2017   Acute intractable headache 05/08/2017   Sepsis ([place]) 02/22/2017   ESRD on hemodialysis ([place]) 02/22/2017   Uremia 02/22/2017   DKA (diabetic ketoacidoses) ([place]) 02/22/2017   Hyperkalemia    DKA, type 1 ([place]) 03/01/2016   Nausea & vomiting 03/01/2016   Flu-like symptoms 03/01/2016   Nephrotic syndrome 10/17/2015   Ascites 10/17/2015   Symptomatic anemia 09/11/2015   Anemia affecting pregnancy in second trimester, antepartum 08/12/2015   Diabetic nephropathy ([place]) 08/07/2015   CKD (chronic kidney disease) stage 3, GFR 30-59 ml/min 08/07/2015   Diabetic retinopathy ([place]) 07/24/2015   Chronic hypertension during pregnancy, antepartum 07/22/2015   Anemia, chronic disease 07/17/2015   T1DM - Type F 06/16/2015   Supervision of high risk pregnancy, antepartum 06/16/2015   Type 1 diabetes mellitus with ketoacidosis without coma ([place]) 02/19/2012    Past Surgical  History:  Procedure Laterality Date   AV FISTULA PLACEMENT Right 10/10/2016   Procedure: CREATION OF RIGHT ARM RADIOCEPHALIC ARTERIOVENOUS (AV) FISTULA;  Surgeon: Serafina Mitchell, MD;  Location: [facility];  Service: Vascular;  Laterality: Right;   CESAREAN SECTION N/A 10/19/2015   Procedure: CESAREAN SECTION;  Surgeon: Chancy Milroy, MD;  Location: [facility];  Service: Obstetrics;  Laterality: N/A;   lipo suction  2015   TONSILLECTOMY     TONSILLECTOMY AND ADENOIDECTOMY        OB History    Gravida  1   Para  1   Term      Preterm  1   AB      Living  1     SAB      TAB      Ectopic      Multiple  0   Live Births               Home Medications    Prior to Admission medications   Medication Sig Start Date End Date Taking? Authorizing Provider  ACCU-CHEK FASTCLIX LANCETS [place] As directed 12/09/16  Yes Elayne Snare, MD  amLODipine (NORVASC) 10 MG tablet TAKE ONE TABLET BY MOUTH ONCE DAILY Patient taking differently: Take 10 mg by mouth daily.  04/16/16  Yes Karim-Rhoades, Walidah N, CNM  Blood Glucose Monitoring Suppl (ACCU-CHEK AVIVA PLUS) w/Device KIT Use to check blood sugars 5 times daily Patient taking differently: 1 each by Other route 4 (four) times daily. Use to check blood sugars 3-4 times daily 09/09/16  Yes Elayne Snare, MD  glucose blood (ACCU-CHEK AVIVA PLUS) test strip Use to check blood sugars four times daily 06/03/17  Yes Elayne Snare, MD  insulin aspart (NOVOLOG) 100 UNIT/ML injection Inject 0-20 Units into the skin 3 (three) times daily before meals. Sliding scale 09/10/18  Yes Elayne Snare, MD  Insulin Detemir (LEVEMIR FLEXTOUCH) 100 UNIT/ML Pen INJECT 10 UNITS INTO THE SKIN IN THE MORNING AND INJECT 11 UNITS INTO THE SKIN IN THE EVENING. Patient taking differently: Inject 10-11 Units into the skin See admin instructions. INJECT 10 UNITS IN THE MORNING AND 11 UNITS IN THE EVENING. 09/10/18  Yes Elayne Snare, MD  losartan (COZAAR) 50 MG tablet Take 50 mg by mouth at bedtime. 12/11/18  Yes [provider]  megestrol (MEGACE) 40 MG tablet Take 1 tablet (40 mg total) by mouth 2 (two) times daily. 01/19/18  Yes Julianne Handler, CNM  metoprolol succinate (TOPROL-XL) 50 MG 24 hr tablet Take 50 mg by mouth daily. 07/22/18  Yes [provider]  nortriptyline (PAMELOR) 10 MG capsule TAKE 2 CAPSULES BY MOUTH AT BEDTIME Patient taking differently: Take 20 mg by mouth at bedtime.  09/24/18  Yes Marcial Pacas, MD  sevelamer  (RENAGEL) 800 MG tablet Take 2,400 mg by mouth 3 (three) times daily with meals. 3 tablets with each meal and snacks 04/10/17  Yes [provider]  Fremanezumab-vfrm (AJOVY) 225 MG/1.5ML SOSY Inject 225 mg into the skin every 30 (thirty) days. Patient not taking: Reported on 01/06/2019 09/18/17   Marcial Pacas, MD  glucagon (GLUCAGON EMERGENCY) 1 MG injection Inject 1 mg into the skin once as needed for up to 1 dose. Inject 20m into skin once as needed for low blood sugar. Patient not taking: Reported on 01/06/2019 06/03/17   KElayne Snare MD  rizatriptan (MAXALT-MLT) 5 MG disintegrating tablet Take 1 tablet (5 mg total) by mouth as needed. May repeat in 2 hours  if needed Patient not taking: Reported on 01/06/2019 05/08/17   Marcial Pacas, MD  buPROPion (WELLBUTRIN XL) 150 MG 24 hr tablet Take 150 mg daily by mouth.  11/10/18  [provider]    Family History Family History  Problem Relation Age of Onset   Cancer Maternal Grandmother        colon and breast   Hypertension Maternal Grandmother    Breast cancer Maternal Grandmother    Diabetes Maternal Grandfather    Hypertension Maternal Grandfather    Diabetes Mother    Hypertension Mother    Diabetes Father    Hypertension Father    Sickle cell trait Father    Diabetes Paternal Grandmother    Hypertension Paternal Grandmother    Diabetes Paternal Grandfather    Hypertension Paternal Grandfather     Social History Social History   Tobacco Use   Smoking status: Never Smoker   Smokeless tobacco: Never Used  Substance Use Topics   Alcohol use: Yes    Comment: very rarely   Drug use: No     Allergies   Ivp dye [iodinated diagnostic agents], Propranolol hcl er, Iodine, and Nickel   Review of Systems Review of Systems   Physical Exam Updated Vital Signs BP (!) 149/77 (BP Location: Left Arm)    Pulse 87    Temp 99.1 F (37.3 C) (Oral)    Resp 15    Ht '5\' 5"'  (1.651 m)    LMP 04/07/2018 (Exact  Date) Comment: PT was shielded, irregular periods.    SpO2 98%    BMI 11.32 kg/m   Physical Exam   ED Treatments / Results  Labs (all labs ordered are listed, but only abnormal results are displayed) Labs Reviewed  BASIC METABOLIC PANEL - Abnormal; Notable for the following components:      Result Value   Chloride 89 (*)    Glucose, Bld 329 (*)    BUN 27 (*)    Creatinine, Ser 9.32 (*)    Calcium 8.4 (*)    GFR calc non Af Amer 5 (*)    GFR calc Af Amer 6 (*)    Anion gap 24 (*)    All other components within normal limits  CBC - Abnormal; Notable for the following components:   WBC 15.7 (*)    RBC 3.63 (*)    MCV 101.4 (*)    All other components within normal limits  BASIC METABOLIC PANEL - Abnormal; Notable for the following components:   Chloride 90 (*)    CO2 21 (*)    Glucose, Bld 325 (*)    BUN 35 (*)    Creatinine, Ser 10.09 (*)    Calcium 8.1 (*)    GFR calc non Af Amer 5 (*)    GFR calc Af Amer 5 (*)    Anion gap 24 (*)    All other components within normal limits  BASIC METABOLIC PANEL - Abnormal; Notable for the following components:   Chloride 97 (*)    Glucose, Bld 105 (*)    BUN 38 (*)    Creatinine, Ser 10.43 (*)    Calcium 7.7 (*)    GFR calc non Af Amer 4 (*)    GFR calc Af Amer 5 (*)    Anion gap 19 (*)    All other components within normal limits  BASIC METABOLIC PANEL - Abnormal; Notable for the following components:   Chloride 91 (*)    CO2 21 (*)  Glucose, Bld 254 (*)    BUN 39 (*)    Creatinine, Ser 10.70 (*)    Calcium 7.7 (*)    GFR calc non Af Amer 4 (*)    GFR calc Af Amer 5 (*)    Anion gap 23 (*)    All other components within normal limits  BASIC METABOLIC PANEL - Abnormal; Notable for the following components:   Chloride 94 (*)    Glucose, Bld 164 (*)    BUN 43 (*)    Creatinine, Ser 11.60 (*)    Calcium 7.7 (*)    GFR calc non Af Amer 4 (*)    GFR calc Af Amer 4 (*)    Anion gap 16 (*)    All other components within  normal limits  BETA-HYDROXYBUTYRIC ACID - Abnormal; Notable for the following components:   Beta-Hydroxybutyric Acid 3.14 (*)    All other components within normal limits  BETA-HYDROXYBUTYRIC ACID - Abnormal; Notable for the following components:   Beta-Hydroxybutyric Acid 6.01 (*)    All other components within normal limits  HEMOGLOBIN A1C - Abnormal; Notable for the following components:   Hgb A1c MFr Bld 8.5 (*)    All other components within normal limits  CBC - Abnormal; Notable for the following components:   WBC 13.3 (*)    RBC 3.31 (*)    Hemoglobin 11.2 (*)    HCT 33.2 (*)    MCV 100.3 (*)    All other components within normal limits  GLUCOSE, CAPILLARY - Abnormal; Notable for the following components:   Glucose-Capillary 107 (*)    All other components within normal limits  GLUCOSE, CAPILLARY - Abnormal; Notable for the following components:   Glucose-Capillary 160 (*)    All other components within normal limits  GLUCOSE, CAPILLARY - Abnormal; Notable for the following components:   Glucose-Capillary 232 (*)    All other components within normal limits  GLUCOSE, CAPILLARY - Abnormal; Notable for the following components:   Glucose-Capillary 254 (*)    All other components within normal limits  GLUCOSE, CAPILLARY - Abnormal; Notable for the following components:   Glucose-Capillary 256 (*)    All other components within normal limits  GLUCOSE, CAPILLARY - Abnormal; Notable for the following components:   Glucose-Capillary 187 (*)    All other components within normal limits  GLUCOSE, CAPILLARY - Abnormal; Notable for the following components:   Glucose-Capillary 167 (*)    All other components within normal limits  GLUCOSE, CAPILLARY - Abnormal; Notable for the following components:   Glucose-Capillary 151 (*)    All other components within normal limits  GLUCOSE, CAPILLARY - Abnormal; Notable for the following components:   Glucose-Capillary 113 (*)    All other  components within normal limits  GLUCOSE, CAPILLARY - Abnormal; Notable for the following components:   Glucose-Capillary 114 (*)    All other components within normal limits  CBG MONITORING, ED - Abnormal; Notable for the following components:   Glucose-Capillary 350 (*)    All other components within normal limits  POCT I-STAT 7, (LYTES, BLD GAS, ICA,H+H) - Abnormal; Notable for the following components:   Sodium 130 (*)    Potassium 5.9 (*)    Calcium, Ion 0.92 (*)    All other components within normal limits  CBG MONITORING, ED - Abnormal; Notable for the following components:   Glucose-Capillary 296 (*)    All other components within normal limits  CBG MONITORING, ED - Abnormal; Notable  for the following components:   Glucose-Capillary 157 (*)    All other components within normal limits  SARS CORONAVIRUS 2 (TAT 6-24 HRS)  CULTURE, BLOOD (ROUTINE X 2)  CULTURE, BLOOD (ROUTINE X 2) W REFLEX TO ID PANEL  D-DIMER, QUANTITATIVE (NOT AT [facility])  LACTIC ACID, PLASMA  HIV ANTIBODY (ROUTINE TESTING W REFLEX)  MAGNESIUM  TSH  VITAMIN B12  LIPASE, BLOOD  GLUCOSE, CAPILLARY  URINALYSIS, ROUTINE W REFLEX MICROSCOPIC  FOLATE RBC  BASIC METABOLIC PANEL  BETA-HYDROXYBUTYRIC ACID  I-STAT BETA HCG BLOOD, ED ([facility], [facility], [facility] ONLY)  I-STAT ARTERIAL BLOOD GAS, ED  TROPONIN I (HIGH SENSITIVITY)  TROPONIN I (HIGH SENSITIVITY)  TROPONIN I (HIGH SENSITIVITY)    EKG EKG Interpretation  Date/Time:  Wednesday January 06 2019 12:41:38 EST Ventricular Rate:  82 PR Interval:  154 QRS Duration: 78 QT Interval:  444 QTC Calculation: 518 R Axis:   20 Text Interpretation: Normal sinus rhythm Possible Left atrial enlargement Prolonged QT Abnormal ECG Confirmed by Pattricia Boss [phone]) on 01/06/2019 4:01:51 PM   Radiology Ct Abdomen Pelvis Wo Contrast  Result Date: 01/06/2019 CLINICAL DATA:  Chest pain, nausea, and vomiting after dialysis. Hyperglycemia. EXAM: CT ABDOMEN AND PELVIS WITHOUT  CONTRAST TECHNIQUE: Multidetector CT imaging of the abdomen and pelvis was performed following the standard protocol without IV contrast. COMPARISON:  03/11/2012 FINDINGS: Lower chest: Moderate cardiomegaly. Prominence of lower lobe pulmonary venous structures, query pulmonary venous hypertension. Mild subcutaneous edema, no overt effusions. Mild dependent subsegmental atelectasis. Hepatobiliary: New hypodensity along the falciform ligament measuring 1.9 by 0.9 cm on image 28/3 and possibly spanning across the falciform ligament. Given the location, focal hepatic steatosis is the most likely cause. Pancreas: Unremarkable Spleen: Unremarkable Adrenals/Urinary Tract: Unremarkable Stomach/Bowel: No dilated bowel or discrete bowel abnormality is identified. However, there is mesenteric edema. Vascular/Lymphatic: Mild aortoiliac atherosclerotic vascular disease. Although mild this is advanced for age. Small retroperitoneal lymph nodes are not pathologically enlarged. Reproductive: Uterus 10.2 cm in length. Adnexal contours unremarkable. Other: Trace ascites along the right lower quadrant paracolic gutter. Musculoskeletal: Unremarkable IMPRESSION: 1. Moderate cardiomegaly. Prominence of lower lobe pulmonary venous structures, query pulmonary venous hypertension. 2. New hypodensity along the falciform ligament measuring 1.9 by 0.9 cm in diameter, possibly spanning across the falciform ligament. Given the location, focal hepatic steatosis is the most likely cause. 3. Trace ascites along the right lower quadrant paracolic gutter, with nonspecific mesenteric edema also noted. 4. Mild but advanced for age aortoiliac atherosclerotic vascular disease. Aortic Atherosclerosis (ICD10-I70.0). 5. Mildly enlarged uterus. 6. Mild subcutaneous edema. Electronically Signed   By: Van Clines M.D.   On: 01/06/2019 18:38   Dg Chest 2 View  Result Date: 01/06/2019 CLINICAL DATA:  Chest pain.  Chronic renal failure EXAM: CHEST - 2  VIEW COMPARISON:  February 21, 2017 FINDINGS: The lungs are clear. Heart is mildly enlarged with pulmonary vascularity normal. No adenopathy. No pneumothorax. No bone lesions. IMPRESSION: Lungs clear.  Mild cardiac enlargement.  No adenopathy. Electronically Signed   By: Lowella Grip III M.D.   On: 01/06/2019 13:11    Procedures Procedures (including critical care time)  Medications Ordered in ED Medications  sodium chloride flush (NS) 0.9 % injection 3 mL (has no administration in time range)  acetaminophen (TYLENOL) tablet 650 mg (650 mg Oral Not Given 01/06/19 1525)  heparin injection 5,000 Units (5,000 Units Subcutaneous Given 01/07/19 0534)  insulin regular, human (MYXREDLIN) 100 units/ 100 mL infusion (0.2 Units/hr Intravenous Rate/Dose Change 01/07/19 0526)  dextrose  50 % solution 0-50 mL (has no administration in time range)  ondansetron (ZOFRAN) injection 4 mg (4 mg Intravenous Given 01/06/19 2159)  0.9 %  sodium chloride infusion (has no administration in time range)  hydrALAZINE (APRESOLINE) injection 10 mg (has no administration in time range)  dextrose 5 %-0.45 % sodium chloride infusion ( Intravenous New Bag/Given 01/06/19 1857)  Chlorhexidine Gluconate Cloth 2 % PADS 6 each (has no administration in time range)  ondansetron (ZOFRAN) injection 4 mg (4 mg Intravenous Given 01/06/19 1350)  alum & mag hydroxide-simeth (MAALOX/MYLANTA) 200-200-20 MG/5ML suspension 30 mL (30 mLs Oral Given 01/06/19 1419)    And  lidocaine (XYLOCAINE) 2 % viscous mouth solution 15 mL (15 mLs Oral Given 01/06/19 1420)  promethazine (PHENERGAN) injection 12.5 mg (12.5 mg Intravenous Given 01/06/19 1516)  insulin aspart (novoLOG) injection 10 Units (10 Units Subcutaneous Given 01/06/19 1518)  sodium chloride 0.9 % bolus 1,000 mL (1,000 mLs Intravenous New Bag/Given 01/06/19 1648)     Initial Impression / Assessment and Plan / ED Course  I have reviewed the triage vital signs and the nursing  notes.  Pertinent labs & imaging results that were available during my care of the patient were reviewed by me and considered in my medical decision making (see chart for details).  Clinical Course as of Jan 07 724  Wed Jan 06, 2019  1350 Dialysis patient coming to ED for acute onset CP, SOB during dialysis tx. Hx of similar in the past during dialysis as well. Described as "acid reflux". Initially tachycardic to 116 , afebrile. Hx DKA. Wokring up for the same. Case passed to Gust Brooms due to change of shift   [KM]  [place] with Dr. Tamala Julian to admit patient for DKA.   [KM]    Clinical Course User Index [KM] Alveria Apley, PA-C       Final Clinical Impressions(s) / ED Diagnoses   Final diagnoses:  Diabetic ketoacidosis without coma associated with type 1 diabetes mellitus [facility])    ED Discharge Orders    None       Kristine Royal 01/06/19 1538    Alveria Apley, PA-C 01/07/19 0725    Pattricia Boss, MD 01/08/19 [phone]

## 2019-01-06 NOTE — ED Notes (Signed)
Report given to [facility], [place] on 5W

## 2019-01-06 NOTE — ED Notes (Signed)
Pt feels to nauseated to take oral meds at this time

## 2019-01-06 NOTE — ED Notes (Signed)
Patient transported to CT 

## 2019-01-06 NOTE — Progress Notes (Signed)
  Pt orientation to unit, room and routine. Information packet given to patient/family and safety video watched.  Admission INP armband ID verified with patient/family, and in place. SR up x 2, fall risk assessment complete with Patient and family verbalizing understanding of risks associated with falls. Pt verbalizes an understanding of how to use the call bell and to call for help before getting out of bed.  Skin, clean-dry- intact without evidence of bruising, or skin tears.   No evidence of skin break down noted on exam.   Will cont to monitor and assist as needed.  Hosie Spangle, RN 01/06/2019 7:37 PM

## 2019-01-07 DIAGNOSIS — I10 Essential (primary) hypertension: Secondary | ICD-10-CM

## 2019-01-07 DIAGNOSIS — E1010 Type 1 diabetes mellitus with ketoacidosis without coma: Secondary | ICD-10-CM | POA: Diagnosis not present

## 2019-01-07 DIAGNOSIS — N186 End stage renal disease: Secondary | ICD-10-CM

## 2019-01-07 LAB — BASIC METABOLIC PANEL
Anion gap: 23 — ABNORMAL HIGH (ref 5–15)
Anion gap: 16 — ABNORMAL HIGH (ref 5–15)
Anion gap: 15 (ref 5–15)
BUN: 39 mg/dL — ABNORMAL HIGH (ref 6–20)
BUN: 43 mg/dL — ABNORMAL HIGH (ref 6–20)
BUN: 44 mg/dL — ABNORMAL HIGH (ref 6–20)
CO2: 21 mmol/L — ABNORMAL LOW (ref 22–32)
CO2: 26 mmol/L (ref 22–32)
CO2: 26 mmol/L (ref 22–32)
Calcium: 7.7 mg/dL — ABNORMAL LOW (ref 8.9–10.3)
Calcium: 7.7 mg/dL — ABNORMAL LOW (ref 8.9–10.3)
Calcium: 7.8 mg/dL — ABNORMAL LOW (ref 8.9–10.3)
Chloride: 91 mmol/L — ABNORMAL LOW (ref 98–111)
Chloride: 94 mmol/L — ABNORMAL LOW (ref 98–111)
Chloride: 97 mmol/L — ABNORMAL LOW (ref 98–111)
Creatinine, Ser: 10.7 mg/dL — ABNORMAL HIGH (ref 0.44–1.00)
Creatinine, Ser: 11.6 mg/dL — ABNORMAL HIGH (ref 0.44–1.00)
Creatinine, Ser: 11.86 mg/dL — ABNORMAL HIGH (ref 0.44–1.00)
GFR calc Af Amer: 5 mL/min — ABNORMAL LOW (ref >60)
GFR calc Af Amer: 4 mL/min — ABNORMAL LOW (ref >60)
GFR calc Af Amer: 4 mL/min — ABNORMAL LOW (ref >60)
GFR calc non Af Amer: 4 mL/min — ABNORMAL LOW (ref >60)
GFR calc non Af Amer: 4 mL/min — ABNORMAL LOW (ref >60)
GFR calc non Af Amer: 4 mL/min — ABNORMAL LOW (ref >60)
Glucose, Bld: 254 mg/dL — ABNORMAL HIGH (ref 70–99)
Glucose, Bld: 164 mg/dL — ABNORMAL HIGH (ref 70–99)
Glucose, Bld: 103 mg/dL — ABNORMAL HIGH (ref 70–99)
Potassium: 4.8 mmol/L (ref 3.5–5.1)
Potassium: 4.3 mmol/L (ref 3.5–5.1)
Potassium: 4.2 mmol/L (ref 3.5–5.1)
Sodium: 135 mmol/L (ref 135–145)
Sodium: 136 mmol/L (ref 135–145)
Sodium: 138 mmol/L (ref 135–145)

## 2019-01-07 LAB — GLUCOSE, CAPILLARY
Glucose-Capillary: 101 mg/dL — ABNORMAL HIGH (ref 70–99)
Glucose-Capillary: 103 mg/dL — ABNORMAL HIGH (ref 70–99)
Glucose-Capillary: 108 mg/dL — ABNORMAL HIGH (ref 70–99)
Glucose-Capillary: 109 mg/dL — ABNORMAL HIGH (ref 70–99)
Glucose-Capillary: 113 mg/dL — ABNORMAL HIGH (ref 70–99)
Glucose-Capillary: 114 mg/dL — ABNORMAL HIGH (ref 70–99)
Glucose-Capillary: 151 mg/dL — ABNORMAL HIGH (ref 70–99)
Glucose-Capillary: 167 mg/dL — ABNORMAL HIGH (ref 70–99)
Glucose-Capillary: 187 mg/dL — ABNORMAL HIGH (ref 70–99)
Glucose-Capillary: 254 mg/dL — ABNORMAL HIGH (ref 70–99)
Glucose-Capillary: 256 mg/dL — ABNORMAL HIGH (ref 70–99)
Glucose-Capillary: 92 mg/dL (ref 70–99)

## 2019-01-07 LAB — BETA-HYDROXYBUTYRIC ACID
Beta-Hydroxybutyric Acid: 6.01 mmol/L — ABNORMAL HIGH (ref 0.05–0.27)
Beta-Hydroxybutyric Acid: 0.93 mmol/L — ABNORMAL HIGH (ref 0.05–0.27)

## 2019-01-07 MED ORDER — ONDANSETRON HCL 4 MG PO TABS: 4.0000 mg | ORAL_TABLET | Freq: Every day | ORAL | 0 refills | Status: DC | PRN

## 2019-01-07 MED ORDER — INSULIN ASPART 100 UNIT/ML ~~LOC~~ SOLN: 0.0000 [IU] | Freq: Three times a day (TID) | SUBCUTANEOUS | Status: DC

## 2019-01-07 MED ORDER — INSULIN ASPART 100 UNIT/ML ~~LOC~~ SOLN: 0.0000 [IU] | Freq: Every day | SUBCUTANEOUS | Status: DC

## 2019-01-07 MED ORDER — AMLODIPINE BESYLATE 10 MG PO TABS: 10.0000 mg | ORAL_TABLET | Freq: Every day | ORAL | 0 refills | Status: AC

## 2019-01-07 MED ORDER — ACCU-CHEK AVIVA PLUS W/DEVICE KIT: 1.0000 | PACK | Freq: Four times a day (QID) | Status: DC

## 2019-01-07 MED ORDER — ACETAMINOPHEN 325 MG PO TABS
650.0000 mg | ORAL_TABLET | Freq: Four times a day (QID) | ORAL | Status: DC | PRN
  Administered 2019-01-07: 09:00:00 650 mg via ORAL
  Filled 2019-01-07: qty 2

## 2019-01-07 MED ORDER — INSULIN DETEMIR 100 UNIT/ML ~~LOC~~ SOLN
7.0000 [IU] | Freq: Two times a day (BID) | SUBCUTANEOUS | Status: DC
  Administered 2019-01-07: 7 [IU] via SUBCUTANEOUS
  Filled 2019-01-07 (×2): qty 0.07

## 2019-01-07 MED ORDER — CHLORHEXIDINE GLUCONATE CLOTH 2 % EX PADS
6.0000 | MEDICATED_PAD | Freq: Every day | CUTANEOUS | Status: DC
  Administered 2019-01-07: 10:00:00 6 via TOPICAL

## 2019-01-07 NOTE — Care Management Obs Status (Signed)
[place] NOTIFICATION   Patient Details  Name: Christine Reed MRN: [id number] Date of Birth: 03-05-1985   Medicare Observation Status Notification Given:       Gelene Mink, [place] 01/07/2019, 11:57 AM

## 2019-01-07 NOTE — Care Management CC44 (Signed)
Condition Code 44 Documentation Completed  Patient Details  Name: VAL TIBERI MRN: [id number] Date of Birth: [birth year]   Condition Code 44 given:  Yes Patient signature on Condition Code 44 notice:  Yes Documentation of 2 MD's agreement:  Yes Code 44 added to claim:  Yes    Avarie Tavano B Laia Wiley, LCSWA 01/07/2019, 11:52 AM

## 2019-01-07 NOTE — Discharge Summary (Signed)
Physician Discharge Summary  REGNIA MATHWIG NOB:[id number] DOB: 1985/11/13 DOA: 01/06/2019  PCP: Rexene Agent, MD  Admit date: 01/06/2019 Discharge date: 01/07/2019  Admitted From: Home Disposition: Home  Recommendations for Outpatient Follow-up:  1. Follow up with PCP in 1 week  2. Outpatient follow-up with hemodialysis as scheduled 3. Follow up in ED if symptoms worsen or new appear   Home Health: No Equipment/Devices: None  Discharge Condition: Stable CODE STATUS: Full Diet recommendation: Heart healthy/carb modified/renal hemodialysis diet  Brief/Interim Summary: 33 year old female with history of end-stage renal disease on hemodialysis, diabetes mellitus type 1, anxiety/depression, hypertension because of nausea, vomiting and chest pain.  She was found to have hyperglycemia with elevated anion gap.  She was started on insulin drip.  During the hospitalization, her anion gap closed and she was switched to long-acting insulin.  She feels much better and wants to go home.  If she tolerates diet, she will be discharged home with outpatient follow-up with dialysis as scheduled.  Her last dialysis was yesterday prior to presentation.  Outpatient follow-up with PCP.  Discharge Diagnoses:   DKA in a patient with diabetes mellitus type 1 -Presented with hyperglycemia and anion gap of 24.  Started on insulin drip and was given some IV fluids as well. -Her anion gap is closed.  We will switch to long-acting insulin and discontinue insulin drip subsequently. -Abdominal pain, nausea and vomiting have much improved.  CT of the abdomen and pelvis did not show any acute abnormality - If she tolerates diet, she will be discharged home today. -Continue carb modified diet.  Continue home regimen of insulin including long-acting insulin. -A1c 8.5 -COVID-19 testing negative.  End-stage renal disease on hemodialysis -Hemodialysis was yesterday.  Notify Dr. Schertz/nephrology the patient might  be discharged today.  He was okay with that and patient will not need formal nephrology consultation.  Patient will need to follow-up with dialysis as an outpatient as scheduled.  Leukocytosis -Improving.  Probably reactive.  Chest pain -Most likely musculoskeletal in origin.  Troponin negative.  EKG with no acute changes.  Hypertension--resume home regimen.   Discharge Instructions  Discharge Instructions    Diet - low sodium heart healthy   Complete by: As directed    Diet Carb Modified   Complete by: As directed    Increase activity slowly   Complete by: As directed      Allergies as of 01/07/2019      Reactions   Ivp Dye [iodinated Diagnostic Agents] Other (See Comments)   Burning   Propranolol Hcl Er Swelling   Iodine Rash   Nickel Rash      Medication List    TAKE these medications   Accu-Chek Aviva Plus w/Device Kit 1 each by Other route 4 (four) times daily. Use to check blood sugars 3-4 times daily   Accu-Chek FastClix Lancets Misc As directed   amLODipine 10 MG tablet Commonly known as: NORVASC Take 1 tablet (10 mg total) by mouth daily.   Fremanezumab-vfrm 225 MG/1.5ML Sosy Commonly known as: Ajovy Inject 225 mg into the skin every 30 (thirty) days.   glucagon 1 MG injection Commonly known as: Glucagon Emergency Inject 1 mg into the skin once as needed for up to 1 dose. Inject 25m into skin once as needed for low blood sugar.   glucose blood test strip Commonly known as: Accu-Chek Aviva Plus Use to check blood sugars four times daily   insulin aspart 100 UNIT/ML injection Commonly known as: NovoLOG  Inject 0-20 Units into the skin 3 (three) times daily before meals. Sliding scale   Levemir FlexTouch 100 UNIT/ML Pen Generic drug: Insulin Detemir INJECT 10 UNITS INTO THE SKIN IN THE MORNING AND INJECT 11 UNITS INTO THE SKIN IN THE EVENING. What changed:   how much to take  how to take this  when to take this  additional instructions    losartan 50 MG tablet Commonly known as: COZAAR Take 50 mg by mouth at bedtime.   megestrol 40 MG tablet Commonly known as: MEGACE Take 1 tablet (40 mg total) by mouth 2 (two) times daily.   metoprolol succinate 50 MG 24 hr tablet Commonly known as: TOPROL-XL Take 50 mg by mouth daily.   nortriptyline 10 MG capsule Commonly known as: PAMELOR TAKE 2 CAPSULES BY MOUTH AT BEDTIME   ondansetron 4 MG tablet Commonly known as: Zofran Take 1 tablet (4 mg total) by mouth daily as needed for nausea or vomiting.   rizatriptan 5 MG disintegrating tablet Commonly known as: Maxalt-MLT Take 1 tablet (5 mg total) by mouth as needed. May repeat in 2 hours if needed   sevelamer 800 MG tablet Commonly known as: RENAGEL Take 2,400 mg by mouth 3 (three) times daily with meals. 3 tablets with each meal and snacks      Follow-up Information    Rexene Agent, MD. Schedule an appointment as soon as possible for a visit in 1 week(s).   Specialty: Nephrology Contact information: [facility] [phone]        Hemodialysis Follow up.   Why: Keep scheduled appointment         Allergies  Allergen Reactions  . Ivp Dye [Iodinated Diagnostic Agents] Other (See Comments)    Burning   . Propranolol Hcl Er Swelling  . Iodine Rash  . Nickel Rash    Consultations:  None   Procedures/Studies: Ct Abdomen Pelvis Wo Contrast  Result Date: 01/06/2019 CLINICAL DATA:  Chest pain, nausea, and vomiting after dialysis. Hyperglycemia. EXAM: CT ABDOMEN AND PELVIS WITHOUT CONTRAST TECHNIQUE: Multidetector CT imaging of the abdomen and pelvis was performed following the standard protocol without IV contrast. COMPARISON:  03/11/2012 FINDINGS: Lower chest: Moderate cardiomegaly. Prominence of lower lobe pulmonary venous structures, query pulmonary venous hypertension. Mild subcutaneous edema, no overt effusions. Mild dependent subsegmental atelectasis. Hepatobiliary: New hypodensity  along the falciform ligament measuring 1.9 by 0.9 cm on image 28/3 and possibly spanning across the falciform ligament. Given the location, focal hepatic steatosis is the most likely cause. Pancreas: Unremarkable Spleen: Unremarkable Adrenals/Urinary Tract: Unremarkable Stomach/Bowel: No dilated bowel or discrete bowel abnormality is identified. However, there is mesenteric edema. Vascular/Lymphatic: Mild aortoiliac atherosclerotic vascular disease. Although mild this is advanced for age. Small retroperitoneal lymph nodes are not pathologically enlarged. Reproductive: Uterus 10.2 cm in length. Adnexal contours unremarkable. Other: Trace ascites along the right lower quadrant paracolic gutter. Musculoskeletal: Unremarkable IMPRESSION: 1. Moderate cardiomegaly. Prominence of lower lobe pulmonary venous structures, query pulmonary venous hypertension. 2. New hypodensity along the falciform ligament measuring 1.9 by 0.9 cm in diameter, possibly spanning across the falciform ligament. Given the location, focal hepatic steatosis is the most likely cause. 3. Trace ascites along the right lower quadrant paracolic gutter, with nonspecific mesenteric edema also noted. 4. Mild but advanced for age aortoiliac atherosclerotic vascular disease. Aortic Atherosclerosis (ICD10-I70.0). 5. Mildly enlarged uterus. 6. Mild subcutaneous edema. Electronically Signed   By: Van Clines M.D.   On: 01/06/2019 18:38   Dg Chest 2 View  Result Date: 01/06/2019 CLINICAL DATA:  Chest pain.  Chronic renal failure EXAM: CHEST - 2 VIEW COMPARISON:  February 21, 2017 FINDINGS: The lungs are clear. Heart is mildly enlarged with pulmonary vascularity normal. No adenopathy. No pneumothorax. No bone lesions. IMPRESSION: Lungs clear.  Mild cardiac enlargement.  No adenopathy. Electronically Signed   By: Lowella Grip III M.D.   On: 01/06/2019 13:11       Subjective: Patient seen and examined at bedside.  Feels better.  Currently not  nauseous or vomiting.  Wants to go home today.  Discharge Exam: Vitals:   01/07/19 0000 01/07/19 0400  BP: 122/62 (!) 149/77  Pulse:    Resp: 16 15  Temp: 100 F (37.8 C) 99.1 F (37.3 C)  SpO2: 98% 98%    General: Pt is alert, awake, not in acute distress Cardiovascular: rate controlled, S1/S2 + Respiratory: bilateral decreased breath sounds at bases Abdominal: Soft, NT, ND, bowel sounds + Extremities: Trace lower extremity edema, no cyanosis    The results of significant diagnostics from this hospitalization (including imaging, microbiology, ancillary and laboratory) are listed below for reference.     Microbiology: Recent Results (from the past 240 hour(s))  SARS CORONAVIRUS 2 (TAT 6-24 HRS) Nasopharyngeal Nasopharyngeal Swab     Status: None   Collection Time: 01/06/19  4:35 PM   Specimen: Nasopharyngeal Swab  Result Value Ref Range Status   SARS Coronavirus 2 NEGATIVE NEGATIVE Final    Comment: (NOTE) SARS-CoV-2 target nucleic acids are NOT DETECTED. The SARS-CoV-2 RNA is generally detectable in upper and lower respiratory specimens during the acute phase of infection. Negative results do not preclude SARS-CoV-2 infection, do not rule out co-infections with other pathogens, and should not be used as the sole basis for treatment or other patient management decisions. Negative results must be combined with clinical observations, patient history, and epidemiological information. The expected result is Negative. Fact Sheet for Patients: SugarRoll.be Fact Sheet for Healthcare Providers: https://www.woods-mathews.com/ This test is not yet approved or cleared by the [place] FDA and  has been authorized for detection and/or diagnosis of SARS-CoV-2 by FDA under an Emergency Use Authorization (EUA). This EUA will remain  in effect (meaning this test can be used) for the duration of the COVID-19 declaration under Section  56 4(b)(1) of the Act, 21 U.S.C. section 360bbb-3(b)(1), unless the authorization is terminated or revoked sooner. Performed at [facility], [place] [address], [place], [place] [address]   Blood culture (routine x 2)     Status: None (Preliminary result)   Collection Time: 01/06/19  4:40 PM   Specimen: BLOOD  Result Value Ref Range Status   Specimen Description BLOOD LEFT ANTECUBITAL  Final   Special Requests   Final    BOTTLES DRAWN AEROBIC AND ANAEROBIC Blood Culture adequate volume   Culture   Final    NO GROWTH < 24 HOURS Performed at [facility], [place] [address], [place], [place] [address]    Report Status PENDING  Incomplete  Culture, blood (Routine X 2) w Reflex to ID Panel     Status: None (Preliminary result)   Collection Time: 01/06/19 11:36 PM   Specimen: BLOOD LEFT HAND  Result Value Ref Range Status   Specimen Description BLOOD LEFT HAND  Final   Special Requests   Final    BOTTLES DRAWN AEROBIC AND ANAEROBIC Blood Culture results may not be optimal due to an inadequate volume of blood received in culture bottles  Culture   Final    NO GROWTH < 12 HOURS Performed at [facility], [place] [address] [place], [place] [address]    Report Status PENDING  Incomplete     Labs: BNP (last 3 results) No results for input(s): BNP in the last 8760 hours. Basic Metabolic Panel: Recent Labs  Lab 01/06/19 1635 01/06/19 2022 01/06/19 2336 01/07/19 0353 01/07/19 0758  NA 135 141 135 136 138  K 5.0 4.2 4.8 4.3 4.2  CL 90* 97* 91* 94* 97*  CO2 21* 25 21* 26 26  GLUCOSE 325* 105* 254* 164* 103*  BUN 35* 38* 39* 43* 44*  CREATININE 10.09* 10.43* 10.70* 11.60* 11.86*  CALCIUM 8.1* 7.7* 7.7* 7.7* 7.8*  MG 2.3  --   --   --   --    Liver Function Tests: No results for input(s): AST, ALT, ALKPHOS, BILITOT, PROT, ALBUMIN in the last 168 hours. Recent Labs  Lab 01/06/19 2022  LIPASE 20   No results for input(s): AMMONIA in the last 168  hours. CBC: Recent Labs  Lab 01/06/19 1252 01/06/19 1545 01/06/19 2022  WBC 15.7*  --  13.3*  HGB 12.3 12.6 11.2*  HCT 36.8 37.0 33.2*  MCV 101.4*  --  100.3*  PLT 275  --  254   Cardiac Enzymes: No results for input(s): CKTOTAL, CKMB, CKMBINDEX, TROPONINI in the last 168 hours. BNP: Invalid input(s): POCBNP CBG: Recent Labs  Lab 01/07/19 0745 01/07/19 0849 01/07/19 0957 01/07/19 1121 01/07/19 1212  GLUCAP 103* 101* 92 109* 108*   D-Dimer Recent Labs    01/06/19 1412  DDIMER 0.39   Hgb A1c Recent Labs    01/06/19 1638  HGBA1C 8.5*   Lipid Profile No results for input(s): CHOL, HDL, LDLCALC, TRIG, CHOLHDL, LDLDIRECT in the last 72 hours. Thyroid function studies Recent Labs    01/06/19 1700  TSH 2.444   Anemia work up Recent Labs    01/06/19 2022  VITAMINB12 471   Urinalysis    Component Value Date/Time   COLORURINE RED (A) 01/19/2018 0828   APPEARANCEUR HAZY (A) 01/19/2018 0828   LABSPEC  01/19/2018 0828    TEST NOT REPORTED DUE TO COLOR INTERFERENCE OF URINE PIGMENT   PHURINE  01/19/2018 0828    TEST NOT REPORTED DUE TO COLOR INTERFERENCE OF URINE PIGMENT   GLUCOSEU (A) 01/19/2018 0828    TEST NOT REPORTED DUE TO COLOR INTERFERENCE OF URINE PIGMENT   HGBUR (A) 01/19/2018 0828    TEST NOT REPORTED DUE TO COLOR INTERFERENCE OF URINE PIGMENT   BILIRUBINUR (A) 01/19/2018 0828    TEST NOT REPORTED DUE TO COLOR INTERFERENCE OF URINE PIGMENT   KETONESUR (A) 01/19/2018 0828    TEST NOT REPORTED DUE TO COLOR INTERFERENCE OF URINE PIGMENT   PROTEINUR (A) 01/19/2018 0828    TEST NOT REPORTED DUE TO COLOR INTERFERENCE OF URINE PIGMENT   UROBILINOGEN 0.2 03/01/2016 1421   NITRITE (A) 01/19/2018 0828    TEST NOT REPORTED DUE TO COLOR INTERFERENCE OF URINE PIGMENT   LEUKOCYTESUR (A) 01/19/2018 0828    TEST NOT REPORTED DUE TO COLOR INTERFERENCE OF URINE PIGMENT   Sepsis Labs Invalid input(s): PROCALCITONIN,  WBC,  LACTICIDVEN Microbiology Recent  Results (from the past 240 hour(s))  SARS CORONAVIRUS 2 (TAT 6-24 HRS) Nasopharyngeal Nasopharyngeal Swab     Status: None   Collection Time: 01/06/19  4:35 PM   Specimen: Nasopharyngeal Swab  Result Value Ref Range Status   SARS Coronavirus  2 NEGATIVE NEGATIVE Final    Comment: (NOTE) SARS-CoV-2 target nucleic acids are NOT DETECTED. The SARS-CoV-2 RNA is generally detectable in upper and lower respiratory specimens during the acute phase of infection. Negative results do not preclude SARS-CoV-2 infection, do not rule out co-infections with other pathogens, and should not be used as the sole basis for treatment or other patient management decisions. Negative results must be combined with clinical observations, patient history, and epidemiological information. The expected result is Negative. Fact Sheet for Patients: SugarRoll.be Fact Sheet for Healthcare Providers: https://www.woods-mathews.com/ This test is not yet approved or cleared by the [place] FDA and  has been authorized for detection and/or diagnosis of SARS-CoV-2 by FDA under an Emergency Use Authorization (EUA). This EUA will remain  in effect (meaning this test can be used) for the duration of the COVID-19 declaration under Section 56 4(b)(1) of the Act, 21 U.S.C. section 360bbb-3(b)(1), unless the authorization is terminated or revoked sooner. Performed at [facility], [place] [address], [place], [place] [address]   Blood culture (routine x 2)     Status: None (Preliminary result)   Collection Time: 01/06/19  4:40 PM   Specimen: BLOOD  Result Value Ref Range Status   Specimen Description BLOOD LEFT ANTECUBITAL  Final   Special Requests   Final    BOTTLES DRAWN AEROBIC AND ANAEROBIC Blood Culture adequate volume   Culture   Final    NO GROWTH < 24 HOURS Performed at [facility], [place] [address] [place], [place] [address]    Report Status PENDING   Incomplete  Culture, blood (Routine X 2) w Reflex to ID Panel     Status: None (Preliminary result)   Collection Time: 01/06/19 11:36 PM   Specimen: BLOOD LEFT HAND  Result Value Ref Range Status   Specimen Description BLOOD LEFT HAND  Final   Special Requests   Final    BOTTLES DRAWN AEROBIC AND ANAEROBIC Blood Culture results may not be optimal due to an inadequate volume of blood received in culture bottles   Culture   Final    NO GROWTH < 12 HOURS Performed at [facility], [place] [address] [place], [place] [address]    Report Status PENDING  Incomplete     Time coordinating discharge: 35 minutes  SIGNED:   Aline August, MD  [facility] 01/07/2019, 12:49 PM

## 2019-01-07 NOTE — Plan of Care (Signed)
  Problem: Fluid Volume: Goal: Ability to achieve a balanced intake and output will improve Outcome: Progressing   Problem: Metabolic: Goal: Ability to maintain appropriate glucose levels will improve Outcome: Progressing   Problem: Respiratory: Goal: Will regain and/or maintain adequate ventilation Outcome: Progressing   Problem: Health Behavior/Discharge Planning: Goal: Ability to identify and utilize available resources and services will improve Outcome: Progressing   Problem: Cardiac: Goal: Ability to maintain an adequate cardiac output will improve Outcome: Progressing

## 2019-01-08 ENCOUNTER — Telehealth: Payer: Self-pay | Admitting: Physician Assistant

## 2019-01-08 LAB — FOLATE RBC
Folate, Hemolysate: >620 ng/mL
Folate, RBC: >1829 ng/mL (ref >498)
Hematocrit: 33.9 % — ABNORMAL LOW (ref 34.0–46.6)

## 2019-01-08 NOTE — Telephone Encounter (Signed)
Transition of care from inpatient facility  Date of discharge: 01/07/19 Date of contact: 01/08/2019  Method of contact: Phone  Patient contacted to discuss transition of care from recent inpatient hospitalization. Patient did not answer the phone and mailbox was full. Will follow up at outpatient dialysis center.

## 2019-01-09 ENCOUNTER — Telehealth: Payer: Self-pay | Admitting: Physician Assistant

## 2019-01-09 NOTE — Telephone Encounter (Signed)
Transition of care from inpatient facility  Date of discharge: 01/07/19 Date of contact: 01/09/2019  Method of contact: Phone  Patient contacted to discuss transition of care from recent inpatient hospitalization. Patient was admitted to [facility] from 01/06/19-01/07/19 with a discharge diagnosis of DKA. Medication changes were reviewed. Patient had dialysis at her outpatient unit today and reports it went well. Other follow up needed includes PCP for management of diabetes.

## 2019-01-11 LAB — CULTURE, BLOOD (ROUTINE X 2)
Culture: NO GROWTH
Culture: NO GROWTH
Special Requests: ADEQUATE

## 2019-01-12 ENCOUNTER — Ambulatory Visit: Admit: 2019-01-12 | Discharge: 2019-01-12 | Payer: MEDICARE

## 2019-01-13 DIAGNOSIS — R9431 Abnormal electrocardiogram [ECG] [EKG]: Principal | ICD-10-CM

## 2019-01-13 DIAGNOSIS — Z01810 Encounter for preprocedural cardiovascular examination: Principal | ICD-10-CM

## 2019-01-13 DIAGNOSIS — Z7682 Awaiting organ transplant status: Principal | ICD-10-CM

## 2019-01-15 ENCOUNTER — Other Ambulatory Visit: Payer: Self-pay

## 2019-01-29 ENCOUNTER — Encounter: Payer: Self-pay | Admitting: Internal Medicine

## 2019-01-29 ENCOUNTER — Ambulatory Visit (INDEPENDENT_AMBULATORY_CARE_PROVIDER_SITE_OTHER): Payer: Medicare Other | Admitting: Internal Medicine

## 2019-01-29 VITALS — BP 160/100 | HR 97 | Temp 97.8°F | Ht 65.0 in | Wt 162.8 lb

## 2019-01-29 DIAGNOSIS — R61 Generalized hyperhidrosis: Secondary | ICD-10-CM | POA: Diagnosis not present

## 2019-01-29 DIAGNOSIS — Z992 Dependence on renal dialysis: Secondary | ICD-10-CM | POA: Diagnosis not present

## 2019-01-29 DIAGNOSIS — R221 Localized swelling, mass and lump, neck: Secondary | ICD-10-CM | POA: Diagnosis not present

## 2019-01-29 DIAGNOSIS — I10 Essential (primary) hypertension: Secondary | ICD-10-CM

## 2019-01-29 DIAGNOSIS — Z23 Encounter for immunization: Secondary | ICD-10-CM

## 2019-01-29 DIAGNOSIS — N186 End stage renal disease: Secondary | ICD-10-CM

## 2019-01-29 DIAGNOSIS — E10319 Type 1 diabetes mellitus with unspecified diabetic retinopathy without macular edema: Secondary | ICD-10-CM | POA: Diagnosis not present

## 2019-01-29 DIAGNOSIS — E1022 Type 1 diabetes mellitus with diabetic chronic kidney disease: Secondary | ICD-10-CM | POA: Diagnosis not present

## 2019-01-29 DIAGNOSIS — R011 Cardiac murmur, unspecified: Secondary | ICD-10-CM

## 2019-01-29 LAB — COMPREHENSIVE METABOLIC PANEL
ALT: 23 U/L (ref 0–35)
AST: 16 U/L (ref 0–37)
Albumin: 4.4 g/dL (ref 3.5–5.2)
Alkaline Phosphatase: 53 U/L (ref 39–117)
BUN: 34 mg/dL — ABNORMAL HIGH (ref 6–23)
CO2: 33 mEq/L — ABNORMAL HIGH (ref 19–32)
Calcium: 9.5 mg/dL (ref 8.4–10.5)
Chloride: 95 mEq/L — ABNORMAL LOW (ref 96–112)
Creatinine, Ser: 8.39 mg/dL (ref 0.40–1.20)
GFR: 6.71 mL/min — CL (ref >60.00)
Glucose, Bld: 251 mg/dL — ABNORMAL HIGH (ref 70–99)
Potassium: 4.6 mEq/L (ref 3.5–5.1)
Sodium: 139 mEq/L (ref 135–145)
Total Bilirubin: 0.9 mg/dL (ref 0.2–1.2)
Total Protein: 7 g/dL (ref 6.0–8.3)

## 2019-01-29 LAB — T3, FREE: T3, Free: 3.3 pg/mL (ref 2.3–4.2)

## 2019-01-29 LAB — TSH: TSH: 1.67 u[IU]/mL (ref 0.35–4.50)

## 2019-01-29 LAB — T4, FREE: Free T4: 0.9 ng/dL (ref 0.60–1.60)

## 2019-01-29 NOTE — Patient Instructions (Signed)
-  Nice seeing you today!!  -Lab work today; will notify you once results are available.  -Flu vaccine today.  -Will send for a thyroid ultrasound.  -Schedule follow up in 3 months.

## 2019-01-29 NOTE — Progress Notes (Signed)
New Patient Office Visit     This visit occurred during the SARS-CoV-2 public health emergency.  Safety protocols were in place, including screening questions prior to the visit, additional usage of staff PPE, and extensive cleaning of exam room while observing appropriate contact time as indicated for disinfecting solutions.    CC/Reason for Visit: Establish care, discuss chronic medical conditions, discuss some acute conditions Previous PCP: None Last Visit: Unknown  HPI: Christine Reed is a 33 y.o. female who is coming in today for the above mentioned reasons. Past Medical History is significant for:   1.  End-stage renal disease on hemodialysis on a Tuesday, Thursday, Saturday schedule since 2018, currently followed by Dr. Justin Mend.  2.  Insulin-dependent diabetes followed by Dr. Dwyane Dee on a regimen of twice daily Levemir 12 and 10 units respectively with NovoLog sliding scale.  She tells me her most recent A1c was 8.1 last month and has been having issues recently with hypoglycemia.  3.  Multiple diabetes complications including nephropathy, retinopathy, neuropathy.  4.  Anemia of chronic disease due to chronic kidney disease with a baseline hemoglobin of around 11.  5.  Hypertension, she states it is typically well controlled however in office today blood pressure is 160/100.  She has not taken her losartan in 3 days as she has not picked it up from her pharmacy.  She was hospitalized for 24 hours over Thanksgiving for DKA.  She is requesting a flu vaccine today.  She also has an acute concern.  She feels like her "neck is swelling and is visible on the outside".  She also has been having issues with diaphoresis which she initially thought was related to hypoglycemia but has noticed diaphoresis even when her sugar is normal or high.  She is a never smoker, drinks alcohol occasionally.  She has allergies to IV contrast dye.  Her family history significant for maternal grandmother  with breast cancer, multiple family members with diabetes including mom, dad, maternal grandfather, maternal uncle, paternal grandmother.   Past Medical/Surgical History: Past Medical History:  Diagnosis Date  . Anemia   . Chronic kidney disease    Pt is on dialysis  . Depression   . Diabetes mellitus    diagnosed at age 44; Type 1  . Diabetic retinopathy ([place])   . GERD (gastroesophageal reflux disease)   . Headache   . Hypertension   . Sickle cell trait [facility])     Past Surgical History:  Procedure Laterality Date  . AV FISTULA PLACEMENT Right 10/10/2016   Procedure: CREATION OF RIGHT ARM RADIOCEPHALIC ARTERIOVENOUS (AV) FISTULA;  Surgeon: Serafina Mitchell, MD;  Location: [place];  Service: Vascular;  Laterality: Right;  . CESAREAN SECTION N/A 10/19/2015   Procedure: CESAREAN SECTION;  Surgeon: Chancy Milroy, MD;  Location: [place];  Service: Obstetrics;  Laterality: N/A;  . lipo suction  2015  . TONSILLECTOMY    . TONSILLECTOMY AND ADENOIDECTOMY      Social History:  reports that she has never smoked. She has never used smokeless tobacco. She reports current alcohol use. She reports that she does not use drugs.  Allergies: Allergies  Allergen Reactions  . Ivp Dye [Iodinated Diagnostic Agents] Other (See Comments)    Burning   . Propranolol Hcl Er Swelling  . Iodine Rash  . Nickel Rash    Family History:  Family History  Problem Relation Age of Onset  . Cancer Maternal Grandmother  colon and breast  . Hypertension Maternal Grandmother   . Breast cancer Maternal Grandmother   . Diabetes Maternal Grandfather   . Hypertension Maternal Grandfather   . Diabetes Mother   . Hypertension Mother   . Diabetes Father   . Hypertension Father   . Sickle cell trait Father   . Diabetes Paternal Grandmother   . Hypertension Paternal Grandmother   . Diabetes Paternal Grandfather   . Hypertension Paternal Grandfather      Current Outpatient Medications:  .   amLODipine (NORVASC) 10 MG tablet, Take 1 tablet (10 mg total) by mouth daily., Disp: 30 tablet, Rfl: 0 .  glucagon (GLUCAGON EMERGENCY) 1 MG injection, Inject 1 mg into the skin once as needed for up to 1 dose. Inject 1mg  into skin once as needed for low blood sugar., Disp: 1 each, Rfl: 12 .  insulin aspart (NOVOLOG) 100 UNIT/ML injection, Inject 0-20 Units into the skin 3 (three) times daily before meals. Sliding scale, Disp: 10 mL, Rfl: 3 .  Insulin Detemir (LEVEMIR FLEXTOUCH) 100 UNIT/ML Pen, INJECT 10 UNITS INTO THE SKIN IN THE MORNING AND INJECT 11 UNITS INTO THE SKIN IN THE EVENING. (Patient taking differently: Inject 10-11 Units into the skin See admin instructions. INJECT 10 UNITS IN THE MORNING AND 11 UNITS IN THE EVENING.), Disp: 3 pen, Rfl: 3 .  losartan (COZAAR) 50 MG tablet, Take 50 mg by mouth at bedtime., Disp: , Rfl:  .  metoprolol succinate (TOPROL-XL) 50 MG 24 hr tablet, Take 50 mg by mouth daily., Disp: , Rfl:  .  sevelamer (RENAGEL) 800 MG tablet, Take 2,400 mg by mouth 3 (three) times daily with meals. 3 tablets with each meal and snacks, Disp: , Rfl: 12  Review of Systems:  Constitutional: Denies fever, chills,  appetite change and fatigue.  HEENT: Denies photophobia, eye pain, redness, hearing loss, ear pain, congestion, sore throat, rhinorrhea, sneezing, mouth sores, trouble swallowing, neck pain, neck stiffness and tinnitus.   Respiratory: Denies SOB, DOE, cough, chest tightness,  and wheezing.   Cardiovascular: Denies chest pain, palpitations and leg swelling.  Gastrointestinal: Denies nausea, vomiting, abdominal pain, diarrhea, constipation, blood in stool and abdominal distention.  Genitourinary: Denies dysuria, urgency, frequency, hematuria, flank pain and difficulty urinating.  Endocrine: Denies: hot or cold intolerance, sweats, changes in hair or nails, polyuria, polydipsia. Musculoskeletal: Denies myalgias, back pain, joint swelling, arthralgias and gait problem.    Skin: Denies pallor, rash and wound.  Neurological: Denies dizziness, seizures, syncope, weakness, light-headedness, numbness and headaches.  Hematological: Denies adenopathy. Easy bruising, personal or family bleeding history  Psychiatric/Behavioral: Denies suicidal ideation, mood changes, confusion, nervousness, sleep disturbance and agitation    Physical Exam: Vitals:   01/29/19 1421  BP: (!) 160/100  Pulse: 97  Temp: 97.8 F (36.6 C)  TempSrc: Temporal  SpO2: 94%  Weight: 162 lb 12.8 oz (73.8 kg)  Height: 5\' 5"  (1.651 m)   Body mass index is 27.09 kg/m.  Constitutional: NAD, calm, comfortable Eyes: PERRL, lids and conjunctivae normal ENMT: Mucous membranes are moist.  Neck: normal, supple, her neck is full and she appears to have thyromegaly. Respiratory: clear to auscultation bilaterally, no wheezing, no crackles. Normal respiratory effort. No accessory muscle use.  Cardiovascular: Regular rate and rhythm, significant holosystolic murmur, possibly S4 gallop.  No extremity edema. 2+ pedal pulses. No carotid bruits.  Abdomen: no tenderness, no masses palpated. No hepatosplenomegaly. Bowel sounds positive.  Musculoskeletal: no clubbing / cyanosis. No joint deformity upper and lower  extremities. Good ROM, no contractures. Normal muscle tone.  Skin: no rashes, lesions, ulcers. No induration Neurologic: Grossly intact and nonfocal Psychiatric: Normal judgment and insight. Alert and oriented x 3. Normal mood.    Impression and Plan:  Type 1 diabetes mellitus with chronic kidney disease on chronic dialysis (HCC) -Recent A1c of 8.1, her diabetes is labile; multiple episodes of hypoglycemia alternating with hyperglycemia. -She is followed closely by Dr. Dwyane Dee with endocrinology. -She is insulin-dependent.  ESRD (end stage renal disease) ([place])  -Dialyzes Tuesday, Thursday, Saturday.  Essential hypertension -Uncontrolled, advised to pick up losartan prescription and take  first pill today. -This can continue to be managed with hemodialysis.  Diabetic retinopathy associated with type 1 diabetes mellitus, macular edema presence unspecified, unspecified laterality, unspecified retinopathy severity ([place]) -Needs referral to ophthalmology.  Neck fullness Diaphoresis   - Plan: TSH, T3, Free, T4, Free, US Soft Tissue Head/Neck    Patient Instructions  -Nice seeing you today!!  -Lab work today; will notify you once results are available.  -Flu vaccine today.  -Will send for a thyroid ultrasound.  -Schedule follow up in 3 months.     Lelon Frohlich, MD [facility]

## 2019-02-02 ENCOUNTER — Encounter: Payer: Self-pay | Admitting: Internal Medicine

## 2019-02-08 MED ORDER — METOPROLOL SUCCINATE ER 50 MG TABLET,EXTENDED RELEASE 24 HR
ORAL_TABLET | 0 refills | 0 days | Status: CP
Start: 2019-02-08 — End: ?

## 2019-02-16 ENCOUNTER — Ambulatory Visit: Admit: 2019-02-16 | Discharge: 2019-02-16 | Payer: MEDICARE

## 2019-02-23 ENCOUNTER — Encounter (HOSPITAL_COMMUNITY): Payer: Self-pay | Admitting: Emergency Medicine

## 2019-02-23 ENCOUNTER — Other Ambulatory Visit: Payer: Self-pay

## 2019-02-23 ENCOUNTER — Emergency Department (HOSPITAL_COMMUNITY)
Admission: EM | Admit: 2019-02-23 | Discharge: 2019-02-23 | Disposition: A | Discharge status: Home / Self Care | Payer: Medicare Other | Attending: Emergency Medicine | Admitting: Emergency Medicine

## 2019-02-23 ENCOUNTER — Emergency Department (HOSPITAL_COMMUNITY): Payer: Medicare Other

## 2019-02-23 DIAGNOSIS — Z79899 Other long term (current) drug therapy: Secondary | ICD-10-CM | POA: Insufficient documentation

## 2019-02-23 DIAGNOSIS — R1110 Vomiting, unspecified: Secondary | ICD-10-CM | POA: Diagnosis present

## 2019-02-23 DIAGNOSIS — Z992 Dependence on renal dialysis: Secondary | ICD-10-CM | POA: Diagnosis not present

## 2019-02-23 DIAGNOSIS — N186 End stage renal disease: Secondary | ICD-10-CM | POA: Diagnosis not present

## 2019-02-23 DIAGNOSIS — R05 Cough: Secondary | ICD-10-CM | POA: Diagnosis not present

## 2019-02-23 DIAGNOSIS — Z794 Long term (current) use of insulin: Secondary | ICD-10-CM | POA: Diagnosis not present

## 2019-02-23 DIAGNOSIS — R112 Nausea with vomiting, unspecified: Secondary | ICD-10-CM | POA: Diagnosis not present

## 2019-02-23 DIAGNOSIS — Z20822 Contact with and (suspected) exposure to covid-19: Secondary | ICD-10-CM | POA: Diagnosis not present

## 2019-02-23 DIAGNOSIS — I120 Hypertensive chronic kidney disease with stage 5 chronic kidney disease or end stage renal disease: Secondary | ICD-10-CM | POA: Insufficient documentation

## 2019-02-23 DIAGNOSIS — E1022 Type 1 diabetes mellitus with diabetic chronic kidney disease: Secondary | ICD-10-CM | POA: Insufficient documentation

## 2019-02-23 DIAGNOSIS — R059 Cough, unspecified: Secondary | ICD-10-CM

## 2019-02-23 LAB — COMPREHENSIVE METABOLIC PANEL
ALT: 16 U/L (ref 0–44)
AST: 17 U/L (ref 15–41)
Albumin: 4 g/dL (ref 3.5–5.0)
Alkaline Phosphatase: 54 U/L (ref 38–126)
Anion gap: 24 — ABNORMAL HIGH (ref 5–15)
BUN: 46 mg/dL — ABNORMAL HIGH (ref 6–20)
CO2: 20 mmol/L — ABNORMAL LOW (ref 22–32)
Calcium: 9.2 mg/dL (ref 8.9–10.3)
Chloride: 95 mmol/L — ABNORMAL LOW (ref 98–111)
Creatinine, Ser: 12.97 mg/dL — ABNORMAL HIGH (ref 0.44–1.00)
GFR calc Af Amer: 4 mL/min — ABNORMAL LOW (ref >60)
GFR calc non Af Amer: 3 mL/min — ABNORMAL LOW (ref >60)
Glucose, Bld: 222 mg/dL — ABNORMAL HIGH (ref 70–99)
Potassium: 4.4 mmol/L (ref 3.5–5.1)
Sodium: 139 mmol/L (ref 135–145)
Total Bilirubin: 2.3 mg/dL — ABNORMAL HIGH (ref 0.3–1.2)
Total Protein: 7.1 g/dL (ref 6.5–8.1)

## 2019-02-23 LAB — URINALYSIS, ROUTINE W REFLEX MICROSCOPIC
Bilirubin Urine: NEGATIVE
Glucose, UA: >=500 mg/dL — AB
Ketones, ur: 5 mg/dL — AB
Leukocytes,Ua: NEGATIVE
Nitrite: NEGATIVE
Protein, ur: >=300 mg/dL — AB
Specific Gravity, Urine: 1.014 (ref 1.005–1.030)
pH: 6 (ref 5.0–8.0)

## 2019-02-23 LAB — CBC
HCT: 30.1 % — ABNORMAL LOW (ref 36.0–46.0)
Hemoglobin: 10.1 g/dL — ABNORMAL LOW (ref 12.0–15.0)
MCH: 32.7 pg (ref 26.0–34.0)
MCHC: 33.6 g/dL (ref 30.0–36.0)
MCV: 97.4 fL (ref 80.0–100.0)
Platelets: 215 10*3/uL (ref 150–400)
RBC: 3.09 MIL/uL — ABNORMAL LOW (ref 3.87–5.11)
RDW: 14.2 % (ref 11.5–15.5)
WBC: 12.2 10*3/uL — ABNORMAL HIGH (ref 4.0–10.5)
nRBC: 0 % (ref 0.0–0.2)

## 2019-02-23 LAB — LIPASE, BLOOD: Lipase: 13 U/L (ref 11–51)

## 2019-02-23 LAB — I-STAT BETA HCG BLOOD, ED (MC, WL, AP ONLY): I-stat hCG, quantitative: <5 m[IU]/mL (ref <5)

## 2019-02-23 LAB — POC SARS CORONAVIRUS 2 AG -  ED: SARS Coronavirus 2 Ag: NEGATIVE

## 2019-02-23 MED ORDER — ONDANSETRON HCL 4 MG PO TABS: 4.0000 mg | ORAL_TABLET | Freq: Three times a day (TID) | ORAL | 0 refills | Status: DC | PRN

## 2019-02-23 MED ORDER — PROMETHAZINE-DM 6.25-15 MG/5ML PO SYRP: 5.0000 mL | ORAL_SOLUTION | Freq: Four times a day (QID) | ORAL | 0 refills | Status: AC | PRN

## 2019-02-23 MED ORDER — ONDANSETRON 4 MG PO TBDP
4.0000 mg | ORAL_TABLET | Freq: Once | ORAL | Status: AC
  Administered 2019-02-23: 4 mg via ORAL
  Filled 2019-02-23: qty 1

## 2019-02-23 MED ORDER — SODIUM CHLORIDE 0.9% FLUSH
3.0000 mL | Freq: Once | INTRAVENOUS | Status: AC
  Administered 2019-02-23: 3 mL via INTRAVENOUS

## 2019-02-23 MED ORDER — PROMETHAZINE HCL 25 MG/ML IJ SOLN
12.5000 mg | Freq: Once | INTRAMUSCULAR | Status: AC
  Administered 2019-02-23: 12.5 mg via INTRAMUSCULAR
  Filled 2019-02-23: qty 1

## 2019-02-23 NOTE — ED Provider Notes (Signed)
[facility] EMERGENCY DEPARTMENT Provider Note   CSN: [id number] Arrival date & time: 02/23/19  [id number]     History Chief Complaint  Patient presents with  . Emesis/Cough   HPI   Blood pressure (!) 151/84, pulse 89, temperature 98.8 F (37.1 C), temperature source Oral, resp. rate 14, last menstrual period 02/12/2019, SpO2 100 %.  Christine Reed is a 34 y.o. female with past medical history significant for type 1 diabetes, on dialysis (last dialyzed Saturday, missed her session this morning) complaining of multiple episodes of nonbloody, nonbilious, noncoffee-ground emesis starting about 4 days ago with dry cough.  She states that the cough started before the vomiting.  She denies any fevers, chills, sick contacts, diarrhea, chest pain, shortness of breath.  She has been quarantining with her son who is 3 at home and he is not sick.  She endorses a burning sensation generally in the abdomen no focal tenderness.  No one else around her is throwing up.     Past Medical History:  Diagnosis Date  . Anemia   . Chronic kidney disease    Pt is on dialysis  . Depression   . Diabetes mellitus    diagnosed at age 54; Type 1  . Diabetic retinopathy ([place])   . GERD (gastroesophageal reflux disease)   . Headache   . Hypertension   . Sickle cell trait [facility])     Patient Active Problem List   Diagnosis Date Noted  . ESRD (end stage renal disease) ([place]) 01/06/2019  . Abdominal pain 01/06/2019  . HTN (hypertension) 01/06/2019  . Chronic migraine 05/08/2017  . Acute intractable headache 05/08/2017  . Sepsis ([place]) 02/22/2017  . ESRD on hemodialysis ([place]) 02/22/2017  . Uremia 02/22/2017  . DKA (diabetic ketoacidoses) ([place]) 02/22/2017  . Hyperkalemia   . DKA, type 1 ([place]) 03/01/2016  . Nausea & vomiting 03/01/2016  . Flu-like symptoms 03/01/2016  . Nephrotic syndrome 10/17/2015  . Ascites 10/17/2015  . Symptomatic anemia 09/11/2015  . Anemia affecting pregnancy in second  trimester, antepartum 08/12/2015  . Diabetic nephropathy ([place]) 08/07/2015  . CKD (chronic kidney disease) stage 3, GFR 30-59 ml/min 08/07/2015  . Diabetic retinopathy ([place]) 07/24/2015  . Chronic hypertension during pregnancy, antepartum 07/22/2015  . Anemia, chronic disease 07/17/2015  . T1DM - Type F 06/16/2015  . Supervision of high risk pregnancy, antepartum 06/16/2015  . Type 1 diabetes mellitus with ketoacidosis without coma ([place]) 02/19/2012    Past Surgical History:  Procedure Laterality Date  . AV FISTULA PLACEMENT Right 10/10/2016   Procedure: CREATION OF RIGHT ARM RADIOCEPHALIC ARTERIOVENOUS (AV) FISTULA;  Surgeon: Serafina Mitchell, MD;  Location: [place];  Service: Vascular;  Laterality: Right;  . CESAREAN SECTION N/A 10/19/2015   Procedure: CESAREAN SECTION;  Surgeon: Chancy Milroy, MD;  Location: [place];  Service: Obstetrics;  Laterality: N/A;  . lipo suction  2015  . TONSILLECTOMY    . TONSILLECTOMY AND ADENOIDECTOMY       OB History    Gravida  1   Para  1   Term      Preterm  1   AB      Living  1     SAB      TAB      Ectopic      Multiple  0   Live Births              Family History  Problem Relation Age of Onset  .  Cancer Maternal Grandmother        colon and breast  . Hypertension Maternal Grandmother   . Breast cancer Maternal Grandmother   . Diabetes Maternal Grandfather   . Hypertension Maternal Grandfather   . Diabetes Mother   . Hypertension Mother   . Diabetes Father   . Hypertension Father   . Sickle cell trait Father   . Diabetes Paternal Grandmother   . Hypertension Paternal Grandmother   . Diabetes Paternal Grandfather   . Hypertension Paternal Grandfather     Social History   Tobacco Use  . Smoking status: Never Smoker  . Smokeless tobacco: Never Used  Substance Use Topics  . Alcohol use: Yes    Comment: very rarely  . Drug use: No    Home Medications Prior to Admission medications   Medication  Sig Start Date End Date Taking? Authorizing Provider  amLODipine (NORVASC) 10 MG tablet Take 1 tablet (10 mg total) by mouth daily. 01/07/19  Yes Aline August, MD  glucagon (GLUCAGON EMERGENCY) 1 MG injection Inject 1 mg into the skin once as needed for up to 1 dose. Inject 1mg  into skin once as needed for low blood sugar. 06/03/17  Yes Elayne Snare, MD  insulin aspart (NOVOLOG) 100 UNIT/ML injection Inject 0-20 Units into the skin 3 (three) times daily before meals. Sliding scale 09/10/18  Yes Elayne Snare, MD  Insulin Detemir (LEVEMIR FLEXTOUCH) 100 UNIT/ML Pen INJECT 10 UNITS INTO THE SKIN IN THE MORNING AND INJECT 11 UNITS INTO THE SKIN IN THE EVENING. Patient taking differently: Inject 10-11 Units into the skin See admin instructions. Inject 10 units in the morning and 11 units in the evening. 09/10/18  Yes Elayne Snare, MD  losartan (COZAAR) 50 MG tablet Take 50 mg by mouth at bedtime. 12/11/18  Yes [provider]  metoprolol succinate (TOPROL-XL) 50 MG 24 hr tablet Take 50 mg by mouth daily. 07/22/18  Yes [provider]  metroNIDAZOLE (FLAGYL) 500 MG tablet Take 500 mg by mouth 2 (two) times daily. 02/16/19  Yes [provider]  sevelamer (RENAGEL) 800 MG tablet Take 2,400 mg by mouth See admin instructions. Take 2400mg  three times daily with each meal and snacks 04/10/17  Yes [provider]  ondansetron (ZOFRAN) 4 MG tablet Take 1 tablet (4 mg total) by mouth every 8 (eight) hours as needed for nausea or vomiting. 02/23/19   Dylan Monforte, Elmyra Ricks, PA-C  promethazine-dextromethorphan (PROMETHAZINE-DM) 6.25-15 MG/5ML syrup Take 5 mLs by mouth 4 (four) times daily as needed for cough. 02/23/19   Kaoru Benda, Elmyra Ricks, PA-C  buPROPion (WELLBUTRIN XL) 150 MG 24 hr tablet Take 150 mg daily by mouth.  11/10/18  [provider]    Allergies    Ivp dye [iodinated diagnostic agents], Propranolol hcl er, Iodine, and Nickel  Review of Systems   Review of Systems  A  complete review of systems was obtained and all systems are negative except as noted in the HPI and PMH.    Physical Exam Updated Vital Signs BP (!) 167/96   Pulse 92   Temp 98.8 F (37.1 C) (Oral)   Resp 17   LMP 02/12/2019   SpO2 98%   Physical Exam Vitals and nursing note reviewed.  Constitutional:      General: She is not in acute distress.    Appearance: She is well-developed. She is not diaphoretic.  HENT:     Head: Normocephalic and atraumatic.  Eyes:     Conjunctiva/sclera: Conjunctivae normal.  Pupils: Pupils are equal, round, and reactive to light.  Cardiovascular:     Rate and Rhythm: Normal rate and regular rhythm.     Comments: Fistula to right biceps with good thrill.  Pulmonary:     Effort: Pulmonary effort is normal. No respiratory distress.     Breath sounds: Normal breath sounds. No stridor. No wheezing, rhonchi or rales.  Chest:     Chest wall: No tenderness.  Abdominal:     General: Abdomen is flat. Bowel sounds are normal. There is no distension.     Palpations: Abdomen is soft. There is no mass.     Tenderness: There is no abdominal tenderness. There is no guarding or rebound.     Hernia: No hernia is present.  Musculoskeletal:        General: Normal range of motion.     Cervical back: Normal range of motion.  Neurological:     Mental Status: She is alert and oriented to person, place, and time.     ED Results / Procedures / Treatments   Labs (all labs ordered are listed, but only abnormal results are displayed) Labs Reviewed  COMPREHENSIVE METABOLIC PANEL - Abnormal; Notable for the following components:      Result Value   Chloride 95 (*)    CO2 20 (*)    Glucose, Bld 222 (*)    BUN 46 (*)    Creatinine, Ser 12.97 (*)    Total Bilirubin 2.3 (*)    GFR calc non Af Amer 3 (*)    GFR calc Af Amer 4 (*)    Anion gap 24 (*)    All other components within normal limits  CBC - Abnormal; Notable for the following components:   WBC 12.2  (*)    RBC 3.09 (*)    Hemoglobin 10.1 (*)    HCT 30.1 (*)    All other components within normal limits  URINALYSIS, ROUTINE W REFLEX MICROSCOPIC - Abnormal; Notable for the following components:   APPearance HAZY (*)    Glucose, UA >=500 (*)    Hgb urine dipstick MODERATE (*)    Ketones, ur 5 (*)    Protein, ur >=300 (*)    Bacteria, UA RARE (*)    All other components within normal limits  LIPASE, BLOOD  I-STAT BETA HCG BLOOD, ED ([facility], [facility], [facility] ONLY)  POC SARS CORONAVIRUS 2 AG -  ED    EKG None  Radiology DG Chest Portable 1 View  Result Date: 02/23/2019 CLINICAL DATA:  34 year old with current history of end-stage renal disease on hemodialysis, presenting with a 2 day history of nonproductive cough, vomiting, chest congestion and chest tightness. Patient's most recent hemodialysis was 3 days ago. EXAM: PORTABLE CHEST 1 VIEW COMPARISON:  01/06/2019 and earlier. FINDINGS: Cardiac silhouette markedly enlarged, unchanged, allowing for differences in technique. Mild diffuse interstitial pulmonary edema. No confluent or ground-glass airspace consolidation. No visible pleural effusions. IMPRESSION: Mild CHF and/or fluid overload, with stable marked cardiomegaly and mild diffuse interstitial pulmonary edema. Electronically Signed   By: Evangeline Dakin M.D.   On: 02/23/2019 10:15    Procedures Procedures (including critical care time)  Medications Ordered in ED Medications  sodium chloride flush (NS) 0.9 % injection 3 mL (3 mLs Intravenous Given 02/23/19 1108)  promethazine (PHENERGAN) injection 12.5 mg (12.5 mg Intramuscular Given 02/23/19 1109)  ondansetron (ZOFRAN-ODT) disintegrating tablet 4 mg (4 mg Oral Given 02/23/19 1046)    ED Course  I have reviewed the  triage vital signs and the nursing notes.  Pertinent labs & imaging results that were available during my care of the patient were reviewed by me and considered in my medical decision making (see chart for details).    MDM  Rules/Calculators/A&P                      Vitals:   02/23/19 0839 02/23/19 1051 02/23/19 1052 02/23/19 1200  BP: (!) 151/84 (!) 157/89  (!) 167/96  Pulse: 89  92 92  Resp: 14   17  Temp:      TempSrc:      SpO2: 100%  99% 98%    Medications  sodium chloride flush (NS) 0.9 % injection 3 mL (3 mLs Intravenous Given 02/23/19 1108)  promethazine (PHENERGAN) injection 12.5 mg (12.5 mg Intramuscular Given 02/23/19 1109)  ondansetron (ZOFRAN-ODT) disintegrating tablet 4 mg (4 mg Oral Given 02/23/19 1046)    Qiana K Rud is 34 y.o. female presenting with multiple episodes of emesis with coughing and shortness of breath worsening over the course of the last 3 to 4 days.  She denies any fevers, chills, chest pain, sick contacts.  My abdominal exam is benign.  She missed dialysis this morning.  Generally vital signs are reassuring.  COVID-19 rapid antigen testing is negative.  Chest x-ray surprisingly reveals a fair amount of fluid, however patient remains saturating over 98% on room air and generally comfortable.  Discussed with nephrologist Dr. Moshe Cipro who would not recommend any emergent dialysis at this time.  Have advised fluid restriction and for her to be dialyzed tomorrow and to have 2 days in a row.  Patient advised of these recommendations and she will communicate them to her dialysis clinic.  Repeat abdominal exam remains benign, she is tolerated p.o.'s after medication and is comfortable with plan and discharge home.  Evaluation does not show pathology that would require ongoing emergent intervention or inpatient treatment. Pt is hemodynamically stable and mentating appropriately. Discussed findings and plan with patient/guardian, who agrees with care plan. All questions answered. Return precautions discussed and outpatient follow up given.      Final Clinical Impression(s) / ED Diagnoses Final diagnoses:  Non-intractable vomiting with nausea, unspecified vomiting type  Cough     Rx / DC Orders ED Discharge Orders         Ordered    ondansetron (ZOFRAN) 4 MG tablet  Every 8 hours PRN     02/23/19 1238    promethazine-dextromethorphan (PROMETHAZINE-DM) 6.25-15 MG/5ML syrup  4 times daily PRN     02/23/19 1238           Jakylan Ron, Charna Elizabeth 02/23/19 1240    Davonna Belling, MD 02/23/19 1526

## 2019-02-23 NOTE — ED Triage Notes (Addendum)
Patient reports emesis with persistent dry cough , chest congestion/tightness onset Sunday this week , hemodialysis q Tues/Thurs/Sat , last treatment Saturday , denies fever or chills.

## 2019-02-23 NOTE — Discharge Instructions (Addendum)
Christine Reed it is very important that you go to dialysis tomorrow.  It is recommended that you have dialysis for the next 2 days in a row to get fluid off.  Please restrict your fluid intake until you dialyze tomorrow.  If you have any new or worsening symptoms return to the emergency department.

## 2019-02-23 NOTE — ED Notes (Signed)
Put patient in the room patient is in a gown with call bell in reach

## 2019-03-04 DIAGNOSIS — Z7682 Awaiting organ transplant status: Principal | ICD-10-CM

## 2019-03-04 MED ORDER — METOPROLOL SUCCINATE ER 50 MG TABLET,EXTENDED RELEASE 24 HR
ORAL_TABLET | Freq: Every day | ORAL | 2 refills | 30.00000 days | Status: CP
Start: 2019-03-04 — End: 2019-06-02

## 2019-03-04 MED ORDER — METOPROLOL SUCCINATE ER 50 MG TABLET,EXTENDED RELEASE 24 HR: tablet | 0 refills | 0 days | Status: AC

## 2019-03-11 ENCOUNTER — Encounter: Admit: 2019-03-11 | Discharge: 2019-03-11 | Payer: MEDICARE | Attending: Nephrology | Primary: Nephrology

## 2019-03-16 DIAGNOSIS — N186 End stage renal disease: Principal | ICD-10-CM

## 2019-03-16 DIAGNOSIS — Z7682 Awaiting organ transplant status: Principal | ICD-10-CM

## 2019-03-16 DIAGNOSIS — Z01818 Encounter for other preprocedural examination: Principal | ICD-10-CM

## 2019-04-06 DIAGNOSIS — E1029 Type 1 diabetes mellitus with other diabetic kidney complication: Principal | ICD-10-CM

## 2019-04-06 DIAGNOSIS — Z7289 Other problems related to lifestyle: Principal | ICD-10-CM

## 2019-04-06 DIAGNOSIS — Z01810 Encounter for preprocedural cardiovascular examination: Principal | ICD-10-CM

## 2019-04-06 DIAGNOSIS — Z7682 Awaiting organ transplant status: Principal | ICD-10-CM

## 2019-04-06 DIAGNOSIS — N186 End stage renal disease: Principal | ICD-10-CM

## 2019-04-06 DIAGNOSIS — Z992 Dependence on renal dialysis: Principal | ICD-10-CM

## 2019-04-20 ENCOUNTER — Ambulatory Visit
Admit: 2019-04-20 | Discharge: 2019-04-21 | Disposition: A | Discharge status: Home / Self Care | Payer: MEDICARE | Admitting: Surgery

## 2019-04-21 ENCOUNTER — Ambulatory Visit: Payer: Medicare Other

## 2019-04-21 DIAGNOSIS — Z01810 Encounter for preprocedural cardiovascular examination: Principal | ICD-10-CM

## 2019-04-21 DIAGNOSIS — Z7682 Awaiting organ transplant status: Principal | ICD-10-CM

## 2019-04-23 MED ORDER — INSULIN LISPRO 100 UNIT/ML ~~LOC~~ SOLN
3.00 | SUBCUTANEOUS | Status: DC
Start: 2019-04-21 — End: 2019-04-23

## 2019-04-23 MED ORDER — INSULIN NPH (HUMAN) (ISOPHANE) 100 UNIT/ML ~~LOC~~ SUSP
0.15 | SUBCUTANEOUS | Status: DC
Start: 2019-04-21 — End: 2019-04-23

## 2019-04-23 MED ORDER — METOPROLOL SUCCINATE ER 50 MG PO TB24
50.00 | ORAL_TABLET | ORAL | Status: DC
Start: 2019-04-21 — End: 2019-04-23

## 2019-04-23 MED ORDER — AMLODIPINE BESYLATE 10 MG PO TABS
10.00 | ORAL_TABLET | ORAL | Status: DC
Start: 2019-04-21 — End: 2019-04-23

## 2019-04-23 MED ORDER — DEXTROSE 50 % IV SOLN
12.50 | INTRAVENOUS | Status: DC
Start: ? — End: 2019-04-23

## 2019-04-23 MED ORDER — INSULIN LISPRO 100 UNIT/ML ~~LOC~~ SOLN
0.00 | SUBCUTANEOUS | Status: DC
Start: 2019-04-21 — End: 2019-04-23

## 2019-04-23 MED ORDER — LACTATED RINGERS IV SOLN
10.00 | INTRAVENOUS | Status: DC
Start: ? — End: 2019-04-23

## 2019-04-29 ENCOUNTER — Ambulatory Visit: Payer: Medicare Other | Admitting: Internal Medicine

## 2019-05-02 ENCOUNTER — Encounter: Admit: 2019-05-02 | Discharge: 2019-05-02 | Payer: MEDICARE | Attending: Surgery | Primary: Surgery

## 2019-05-03 ENCOUNTER — Ambulatory Visit
Admit: 2019-05-03 | Discharge: 2019-05-11 | Disposition: A | Discharge status: Home Health | Payer: MEDICARE | Admitting: Surgery

## 2019-05-03 ENCOUNTER — Encounter
Admit: 2019-05-03 | Discharge: 2019-05-11 | Disposition: A | Discharge status: Home Health | Payer: MEDICARE | Attending: Certified Registered" | Admitting: Surgery

## 2019-05-03 DIAGNOSIS — Z7682 Awaiting organ transplant status: Principal | ICD-10-CM

## 2019-05-05 DIAGNOSIS — Z01818 Encounter for other preprocedural examination: Principal | ICD-10-CM

## 2019-05-05 DIAGNOSIS — N186 End stage renal disease: Principal | ICD-10-CM

## 2019-05-05 DIAGNOSIS — Z7682 Awaiting organ transplant status: Principal | ICD-10-CM

## 2019-05-10 DIAGNOSIS — Z940 Kidney transplant status: Principal | ICD-10-CM

## 2019-05-10 DIAGNOSIS — E0929 Drug or chemical induced diabetes mellitus with other diabetic kidney complication: Principal | ICD-10-CM

## 2019-05-10 DIAGNOSIS — E1029 Type 1 diabetes mellitus with other diabetic kidney complication: Principal | ICD-10-CM

## 2019-05-10 DIAGNOSIS — Z9483 Pancreas transplant status: Principal | ICD-10-CM

## 2019-05-10 DIAGNOSIS — Z79899 Other long term (current) drug therapy: Principal | ICD-10-CM

## 2019-05-10 MED ORDER — MG-PLUS-PROTEIN 133 MG TABLET
ORAL_TABLET | Freq: Two times a day (BID) | ORAL | 11 refills | 50 days | Status: CP
Start: 2019-05-10 — End: ?
  Filled 2019-05-11: qty 100, 50d supply, fill #0

## 2019-05-10 MED ORDER — ACETAMINOPHEN 500 MG TABLET
ORAL_TABLET | Freq: Four times a day (QID) | ORAL | 0 refills | 13.00000 days | Status: CP | PRN
Start: 2019-05-10 — End: ?
  Filled 2019-05-11: qty 100, 12d supply, fill #0
  Filled 2019-05-11: qty 40, 7d supply, fill #0

## 2019-05-10 MED ORDER — CARVEDILOL 12.5 MG TABLET
ORAL_TABLET | Freq: Two times a day (BID) | ORAL | 11 refills | 30 days | Status: CP
Start: 2019-05-10 — End: 2020-05-09
  Filled 2019-05-11: qty 60, 30d supply, fill #0

## 2019-05-10 MED ORDER — AMLODIPINE 5 MG TABLET
ORAL_TABLET | Freq: Every day | ORAL | 11 refills | 30.00000 days | Status: CP
Start: 2019-05-10 — End: 2020-05-09
  Filled 2019-05-11: qty 30, 30d supply, fill #0

## 2019-05-10 MED ORDER — MYCOPHENOLATE SODIUM 180 MG TABLET,DELAYED RELEASE
ORAL_TABLET | Freq: Two times a day (BID) | ORAL | 11 refills | 23.00000 days | Status: CP
Start: 2019-05-10 — End: 2020-05-09
  Filled 2019-05-11: qty 180, 22d supply, fill #0

## 2019-05-10 MED ORDER — DOCUSATE SODIUM 100 MG CAPSULE
ORAL_CAPSULE | Freq: Two times a day (BID) | ORAL | 2 refills | 15 days | Status: CP | PRN
Start: 2019-05-10 — End: 2020-05-09
  Filled 2019-05-11: qty 30, 15d supply, fill #0

## 2019-05-10 MED ORDER — ASPIRIN 81 MG TABLET,DELAYED RELEASE
ORAL_TABLET | Freq: Every day | ORAL | 11 refills | 30 days | Status: CP
Start: 2019-05-10 — End: 2020-05-09

## 2019-05-10 MED ORDER — BIOTIN 5 MG TABLET
ORAL_TABLET | Freq: Every day | ORAL | 11 refills | 30 days | Status: CP
Start: 2019-05-10 — End: 2020-05-09

## 2019-05-10 MED ORDER — GABAPENTIN 100 MG CAPSULE
ORAL_CAPSULE | Freq: Three times a day (TID) | ORAL | 11 refills | 30.00000 days | Status: CP
Start: 2019-05-10 — End: 2020-05-09
  Filled 2019-05-11: qty 90, 30d supply, fill #0

## 2019-05-10 MED ORDER — TACROLIMUS 1 MG CAPSULE, IMMEDIATE-RELEASE: 6 mg | capsule | Freq: Two times a day (BID) | 11 refills | 30 days | Status: AC

## 2019-05-10 MED ORDER — METOCLOPRAMIDE 10 MG TABLET
ORAL_TABLET | Freq: Four times a day (QID) | ORAL | 0 refills | 30.00000 days | Status: CP
Start: 2019-05-10 — End: 2019-06-09
  Filled 2019-05-11: qty 120, 30d supply, fill #0

## 2019-05-10 MED ORDER — OXYCODONE 5 MG TABLET
ORAL_TABLET | Freq: Four times a day (QID) | ORAL | 0 refills | 5.00000 days | Status: CP | PRN
Start: 2019-05-10 — End: ?
  Filled 2019-05-11: qty 30, 30d supply, fill #0

## 2019-05-10 MED ORDER — TACROLIMUS 1 MG CAPSULE, IMMEDIATE-RELEASE
ORAL_CAPSULE | Freq: Two times a day (BID) | ORAL | 11 refills | 30.00000 days | Status: CP
Start: 2019-05-10 — End: 2020-05-09
  Filled 2019-05-11: qty 420, 30d supply, fill #0

## 2019-05-11 DIAGNOSIS — Z9483 Pancreas transplant status: Principal | ICD-10-CM

## 2019-05-11 DIAGNOSIS — Z940 Kidney transplant status: Principal | ICD-10-CM

## 2019-05-11 MED ORDER — TACROLIMUS 1 MG CAPSULE, IMMEDIATE-RELEASE
ORAL_CAPSULE | Freq: Two times a day (BID) | ORAL | 11 refills | 30 days | Status: CP
Start: 2019-05-11 — End: 2020-05-10

## 2019-05-11 MED ORDER — VALGANCICLOVIR 450 MG TABLET
ORAL_TABLET | Freq: Every day | ORAL | 2 refills | 30 days | Status: CP
Start: 2019-05-11 — End: 2019-08-09
  Filled 2019-05-11: qty 30, 30d supply, fill #0

## 2019-05-11 MED ORDER — PANTOPRAZOLE 40 MG TABLET,DELAYED RELEASE
ORAL_TABLET | Freq: Every day | ORAL | 11 refills | 30 days | Status: CP
Start: 2019-05-11 — End: 2020-05-10
  Filled 2019-05-11: qty 30, 30d supply, fill #0

## 2019-05-11 MED FILL — VALGANCICLOVIR 450 MG TABLET: 30 days supply | Qty: 30 | Fill #0 | Status: AC

## 2019-05-11 MED FILL — MYCOPHENOLATE SODIUM 180 MG TABLET,DELAYED RELEASE: 22 days supply | Qty: 180 | Fill #0 | Status: AC

## 2019-05-11 MED FILL — PANTOPRAZOLE 40 MG TABLET,DELAYED RELEASE: 30 days supply | Qty: 30 | Fill #0 | Status: AC

## 2019-05-11 MED FILL — SULFAMETHOXAZOLE 400 MG-TRIMETHOPRIM 80 MG TABLET: 28 days supply | Qty: 12 | Fill #0 | Status: AC

## 2019-05-11 MED FILL — GABAPENTIN 100 MG CAPSULE: 30 days supply | Qty: 90 | Fill #0 | Status: AC

## 2019-05-11 MED FILL — MG-PLUS-PROTEIN 133 MG TABLET: 50 days supply | Qty: 100 | Fill #0 | Status: AC

## 2019-05-11 MED FILL — AMLODIPINE 5 MG TABLET: 30 days supply | Qty: 30 | Fill #0 | Status: AC

## 2019-05-11 MED FILL — ASPIRIN 81 MG TABLET,DELAYED RELEASE: 30 days supply | Qty: 30 | Fill #0 | Status: AC

## 2019-05-11 MED FILL — TACROLIMUS 1 MG CAPSULE, IMMEDIATE-RELEASE: 30 days supply | Qty: 420 | Fill #0 | Status: AC

## 2019-05-11 MED FILL — ACETAMINOPHEN 500 MG TABLET: 12 days supply | Qty: 100 | Fill #0 | Status: AC

## 2019-05-11 MED FILL — OXYCODONE 5 MG TABLET: 7 days supply | Qty: 40 | Fill #0 | Status: AC

## 2019-05-11 MED FILL — DOK 100 MG CAPSULE: 15 days supply | Qty: 30 | Fill #0 | Status: AC

## 2019-05-11 MED FILL — METOCLOPRAMIDE 10 MG TABLET: 30 days supply | Qty: 120 | Fill #0 | Status: AC

## 2019-05-11 MED FILL — CARVEDILOL 12.5 MG TABLET: 30 days supply | Qty: 60 | Fill #0 | Status: AC

## 2019-05-12 MED ORDER — SULFAMETHOXAZOLE 400 MG-TRIMETHOPRIM 80 MG TABLET
ORAL_TABLET | ORAL | 5 refills | 28 days | Status: CP
Start: 2019-05-12 — End: 2019-11-08
  Filled 2019-05-11 – 2019-06-07 (×2): qty 12, 28d supply, fill #0

## 2019-05-13 DIAGNOSIS — Z9483 Pancreas transplant status: Principal | ICD-10-CM

## 2019-05-13 DIAGNOSIS — Z940 Kidney transplant status: Principal | ICD-10-CM

## 2019-05-14 MED ORDER — AMLODIPINE 5 MG TABLET
ORAL_TABLET | Freq: Every day | ORAL | 11 refills | 30 days
Start: 2019-05-14 — End: 2020-05-13

## 2019-05-17 DIAGNOSIS — Z9483 Pancreas transplant status: Principal | ICD-10-CM

## 2019-05-17 DIAGNOSIS — E1029 Type 1 diabetes mellitus with other diabetic kidney complication: Principal | ICD-10-CM

## 2019-05-17 DIAGNOSIS — Z940 Kidney transplant status: Principal | ICD-10-CM

## 2019-05-17 LAB — CBC W/ DIFFERENTIAL
BASOPHILS ABSOLUTE COUNT: 0.2 10*9/L
BASOPHILS RELATIVE PERCENT: 0.2 %
BASOPHILS RELATIVE PERCENT: 0.3 %
EOSINOPHILS ABSOLUTE COUNT: 0.5 10*9/L
EOSINOPHILS ABSOLUTE COUNT: 0.6 10*9/L
EOSINOPHILS RELATIVE PERCENT: 0.6 %
EOSINOPHILS RELATIVE PERCENT: 0.6 %
HEMATOCRIT: 29.8 % — ABNORMAL LOW
HEMATOCRIT: 31 % — ABNORMAL LOW
HEMOGLOBIN: 9.5 g/dL — ABNORMAL LOW
HEMOGLOBIN: 10.1 g/dL — ABNORMAL LOW
LYMPHOCYTES ABSOLUTE COUNT: 0.5 10*9/L — ABNORMAL LOW
LYMPHOCYTES ABSOLUTE COUNT: 0.4 10*9/L — ABNORMAL LOW
LYMPHOCYTES RELATIVE PERCENT: 0.6 %
LYMPHOCYTES RELATIVE PERCENT: 0.4 %
MEAN CORPUSCULAR HEMOGLOBIN CONC: 31.9 g/dL — ABNORMAL LOW
MEAN CORPUSCULAR HEMOGLOBIN CONC: 32.6 g/dL
MEAN CORPUSCULAR HEMOGLOBIN: 31 pg
MEAN CORPUSCULAR VOLUME: 97.1 fL
MEAN CORPUSCULAR VOLUME: 95.1 fL
MEAN PLATELET VOLUME: 9.9 fL
MONOCYTES ABSOLUTE COUNT: 0.7 10*9/L
MONOCYTES ABSOLUTE COUNT: 0.5 10*9/L
MONOCYTES RELATIVE PERCENT: 7.8 %
MONOCYTES RELATIVE PERCENT: 5.2 %
NEUTROPHILS ABSOLUTE COUNT: 7.7 10*9/L
NEUTROPHILS ABSOLUTE COUNT: 9.7 10*9/L — ABNORMAL HIGH
NEUTROPHILS RELATIVE PERCENT: 90.8 %
NEUTROPHILS RELATIVE PERCENT: 93.5 %
PLATELET COUNT: 305 10*9/L
PLATELET COUNT: 360 10*9/L
RED BLOOD CELL COUNT: 3.07 10*12/L — ABNORMAL LOW
RED BLOOD CELL COUNT: 3.26 10*12/L — ABNORMAL LOW
RED CELL DISTRIBUTION WIDTH: 13.7 %
RED CELL DISTRIBUTION WIDTH: 13.5 %
WBC ADJUSTED: 8.5 10*9/L
WBC ADJUSTED: 10.4 10*9/L

## 2019-05-17 LAB — TACROLIMUS, TROUGH: Lab: 4.2 — ABNORMAL LOW

## 2019-05-17 LAB — BASIC METABOLIC PANEL
BLOOD UREA NITROGEN: 18 mg/dL
BLOOD UREA NITROGEN: 14 mg/dL
CALCIUM: 8.6 mg/dL
CALCIUM: 9 mg/dL
CHLORIDE: 111 mmol/L — ABNORMAL HIGH
CO2: 21 mmol/L
CREATININE: 1.35 mg/dL — ABNORMAL HIGH
CREATININE: 1.41 mg/dL — ABNORMAL HIGH
EGFR CKD-EPI AA FEMALE: 60 mL/min/{1.73_m2}
EGFR CKD-EPI AA FEMALE: 57 mL/min/{1.73_m2} — ABNORMAL LOW
GLUCOSE RANDOM: 82 mg/dL
GLUCOSE RANDOM: 87 mg/dL
POTASSIUM: 5 mmol/L
SODIUM: 138 mmol/L
SODIUM: 140 mmol/L

## 2019-05-17 LAB — ALBUMIN
Lab: 2.9 — ABNORMAL LOW
Lab: 3.1 — ABNORMAL LOW

## 2019-05-17 LAB — AMYLASE
Lab: 68
Lab: 82

## 2019-05-17 LAB — HEMOGLOBIN A1C: Lab: 7.7 — ABNORMAL HIGH

## 2019-05-17 LAB — CHLORIDE: Lab: 113 — ABNORMAL HIGH

## 2019-05-17 LAB — MAGNESIUM
Lab: 1.5
Lab: 1.6

## 2019-05-17 LAB — PHOSPHORUS
Lab: 1.7 — ABNORMAL LOW
Lab: 2.1 — ABNORMAL LOW

## 2019-05-17 LAB — LIPASE
Lab: 13
Lab: 16

## 2019-05-17 LAB — FSAB CLASS 2 ANTIBODY SPECIFICITY
CLASS 2 ANTIBODIES IDENTIFIED: 1:1 {titer}
CPRA%: 0

## 2019-05-17 LAB — CLASS 2 ANTIBODIES IDENTIFIED: Lab: 1:1 {titer}

## 2019-05-17 LAB — MEAN CORPUSCULAR HEMOGLOBIN: Lab: 31

## 2019-05-17 LAB — WHITE BLOOD CELL COUNT: Lab: 8.5

## 2019-05-17 LAB — POTASSIUM: Lab: 5.5 — ABNORMAL HIGH

## 2019-05-17 NOTE — Unmapped (Signed)
Received notification from inpatient team that patient will fill at [facility] after discharge.  Patient has been enrolled in specialty calls at [facility].  Onboarding/welcome call set up for later this week.

## 2019-05-18 DIAGNOSIS — Z9483 Pancreas transplant status: Principal | ICD-10-CM

## 2019-05-18 DIAGNOSIS — Z940 Kidney transplant status: Principal | ICD-10-CM

## 2019-05-18 LAB — CBC W/ DIFFERENTIAL
BASOPHILS ABSOLUTE COUNT: 0.4 10*9/L
BASOPHILS RELATIVE PERCENT: 0.3 %
EOSINOPHILS RELATIVE PERCENT: 1 %
HEMATOCRIT: 30.2 % — ABNORMAL LOW
HEMOGLOBIN: 10 g/dL — ABNORMAL LOW
LYMPHOCYTES ABSOLUTE COUNT: 0.6 10*9/L — ABNORMAL LOW
MEAN CORPUSCULAR HEMOGLOBIN: 31.4 pg
MEAN CORPUSCULAR VOLUME: 95 fL
MEAN PLATELET VOLUME: 9.9 fL
MONOCYTES ABSOLUTE COUNT: 0.5 10*9/L
MONOCYTES RELATIVE PERCENT: 4.4 %
NEUTROPHILS ABSOLUTE COUNT: 1.1 10*9/L — ABNORMAL HIGH
NEUTROPHILS RELATIVE PERCENT: 93.8 %
PLATELET COUNT: 364 10*9/L
RED BLOOD CELL COUNT: 3.18 10*12/L — ABNORMAL LOW
RED CELL DISTRIBUTION WIDTH: 13.6 %
WHITE BLOOD CELL COUNT: 11.6 10*9/L — ABNORMAL HIGH

## 2019-05-18 LAB — BASIC METABOLIC PANEL
BLOOD UREA NITROGEN: 25 mg/dL
CALCIUM: 9 mg/dL
CO2: 22 mmol/L
CREATININE: 1.53 mg/dL — ABNORMAL HIGH
EGFR CKD-EPI AA FEMALE: 52 mL/min/{1.73_m2} — ABNORMAL LOW
POTASSIUM: 5.5 mmol/L — ABNORMAL HIGH
SODIUM: 137 mmol/L

## 2019-05-18 LAB — PHOSPHORUS: Lab: 2.6

## 2019-05-18 LAB — ESTIMATED AVERAGE GLUCOSE: Lab: 0

## 2019-05-18 LAB — MAGNESIUM: Lab: 1.4 — ABNORMAL LOW

## 2019-05-18 LAB — POTASSIUM: Lab: 5.5 — ABNORMAL HIGH

## 2019-05-18 LAB — AMYLASE: Lab: 97

## 2019-05-18 LAB — TACROLIMUS, TROUGH: Lab: 5.2

## 2019-05-18 LAB — ALBUMIN: Lab: 3.2 — ABNORMAL LOW

## 2019-05-18 LAB — LIPASE: Lab: 13

## 2019-05-18 LAB — RED BLOOD CELL COUNT: Lab: 3.18 — ABNORMAL LOW

## 2019-05-18 MED ORDER — TACROLIMUS 1 MG CAPSULE, IMMEDIATE-RELEASE
ORAL_CAPSULE | 11 refills | 0 days | Status: CP
Start: 2019-05-18 — End: ?

## 2019-05-19 DIAGNOSIS — E559 Vitamin D deficiency, unspecified: Principal | ICD-10-CM

## 2019-05-19 DIAGNOSIS — Z940 Kidney transplant status: Principal | ICD-10-CM

## 2019-05-19 DIAGNOSIS — Z79899 Other long term (current) drug therapy: Principal | ICD-10-CM

## 2019-05-19 NOTE — Unmapped (Addendum)
This onboarding contains the following medications.  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  I am speaking to the patient.    Was a Nurse, learning disability used for this call? No    Verified patient's date of birth / HIPAA.    Specialty medication(s) to be sent: Transplant: None- Patient has 2 weeks of medicine on hand and does not need any specialty medication today.      Non-specialty medications/supplies to be sent: none      Medications not needed at this time: none       The patient declined counseling on missed dose instructions, goals of therapy, side effects and monitoring parameters, warnings and precautions, drug/food interactions and storage, handling precautions, and disposal because they have taken the medication previously. The information in the declined sections below are for informational purposes only and was not discussed with patient.     Valcyte (valganciclovir)    Medication & Administration     Dosage:   ??? Take one tablet daily.    Administration:   ??? Take with food  ??? Swallow the pills whole, do not break, crush, or chew    Adherence/Missed dose instructions:  ??? Take a missed dose as soon as you think about it with food  ??? If it is close to your next dose, skip the missed dose and go back to your normal time.  ??? Do not take 2 doses at the same time or extra doses.  ??? Report any missed doses to coordinator    Goals of Therapy     ??? To prevent or treat CMV infection in setting of solid organ transplant    Side Effects & Monitoring Parameters     ??? Common side effects  ??? Headache  ??? Diarrhea or constipation  ??? Appetite or sleep disturbances  ??? Back, muscle, joint, or belly pain  ??? Weight loss  ??? Dizziness  ??? Muscle spasm  ??? Upset stomach or vomiting    ??? The following side effects should be reported to the provider:  ??? Allergic reaction  (rash, hives, swelling, blistered or peeling skin, shortness of breath)  ??? Infection (fever, chills, sore throat, ear/sinus pain, cough, sputum change, urinary pain, mouth sores, non-healing wounds)  ??? Bleeding (cough ground vomit, blood in urine, black/red/tarry stools, unexplained bruising or bleeding)  ??? Electrolyte problems (mood changes, confusion, weakness, abnormal heartbeat, seizures)  ??? Kidney problems (urine changes, weight gain)  ??? Yellowing skin or eyes  ??? Swelling in arms, legs, stomach  ??? Severe dizziness or passing out  ??? Eye issues (eyesight changes, pain, or irritation)  ??? Night sweats    ??? Monitoring parameters  ??? Have eye exam as directed by doctor  ??? CMV counts  ??? CBC  ??? Renal function  ??? Pregnancy test prior to initiation    Contraindications, Warnings, & Precautions     ??? BBW: severe leukopenia, neutropenia, anemia, thrombocytopenia, pancytopenia, and bone marrow failure, including aplastic anemia have been reported  ??? BBW: may cause temporary or permanent inhibition of spermatogenesis and suppression of fertilty; has the potential to cause birth defects and cancers in humans  ??? Female patients should have pregnancy test prior to initiation and use birth control for at least 30 days after discontinuation  ???  Female patients should use a barrier contraceptive while on therapy and for 90 days after discontinuation  ??? Acute renal failure  ??? Not indicated for use in liver transplant recipients  ??? Breastfeeding is not recommended    Drug/Food Interactions     ??? Medication list reviewed in Epic. The patient was instructed to inform the care team before taking any new medications or supplements. No drug interactions identified.   ??? Check with your doctor before getting any vaccinations (live or inactivated)    Storage, Handling Precautions, & Disposal     ??? Store at room temperature  ??? Keep away from children and pets    The patient declined counseling on missed dose instructions, goals of therapy, side effects and monitoring parameters, warnings and precautions, drug/food interactions and storage, handling precautions, and disposal because they have taken the medication previously. The information in the declined sections below are for informational purposes only and was not discussed with patient.       Prograf (tacrolimus)    Medication & Administration     Dosage: Take 9 capsules in the morning and 9 capsules in the evening.     Administration:   ??? May take with or without food  ??? Take 12 hours apart    Adherence/Missed dose instructions:  ??? Take a missed dose as soon as you think about it.  ??? If it is close to the time for your next dose, skip the missed dose and go back to your normal time.  ??? Do not take 2 doses at the same time or extra doses.    Goals of Therapy     ??? To prevent organ rejection    Side Effects & Monitoring Parameters     ??? Common side effects  ??? Dizziness  ??? Fatigue  ??? Headache  ??? Stuffy nose or sore throat  ??? Nausea, vomiting, stomach pain, diarrhea, constipation  ??? Heartburn  ??? Back or joint pain  ??? Increased risk of infection    ??? The following side effects should be reported to the provider:  ??? Allergic reaction  ??? Kidney issues (change in quantity or urine passed, blood in urine, or weight gain)  ??? High blood pressure (dizziness, change in eyesight, headache)  ??? Electrolyte issues (change in mood, confusion, muscle pain, or weakness)  ??? Abnormal breathing  ??? Shakiness  ??? Unexplained bleeding or bruising (gums bleeding, blood in urine, nosebleeds, any abnormal bleeding)  ??? Signs of infection  ??? Skin changes (sores, paleness, new or changed bumps or moles)    ??? Monitoring Parameters  ??? Renal function  ??? Liver function  ??? Glucose levels  ??? Blood pressure  ??? Tacrolimus trough levels  ??? Cardiac monitoring (for QT prolongation)      Contraindications, Warnings, & Precautions     ??? Black Box Warning: Infections - immunosuppressant agents increase the risk of infection that may lead to hospitalization or death  ??? Black Box Warning: Malignancy - immunosuppressant agents may be associated with the development of malignancies that may lead to hospitalization or death  ??? Limit or avoid sun and ultraviolet light exposure, use appropriate sun protection  ??? Myocardial hypertrophy -avoid use in patients with congenital long QT syndrome  ??? Diabetes mellitus - the risk for new-onset diabetes and insulin-dependent post-transplant diabetes mellitus is increased with tacrolimus use after transplantation  ??? GI perforation  ??? Hyperkalemia  ??? Hypertension  ??? Nephrotoxicity  ??? Neurotoxicity  ??? This is a  narrow therapeutic index drug. Do not switch manufacturers without first talking to the provider.    Drug/Food Interactions     ??? Medication list reviewed in Epic. The patient was instructed to inform the care team before taking any new medications or supplements. No drug interactions identified.   ??? Avoid alcohol  ??? Avoid grapefruit or grapefruit juice  ??? Avoid live vaccines    Storage, Handling Precautions, & Disposal     ??? Store at room temperature  ??? Keep away from children and pets    The patient declined counseling on missed dose instructions, goals of therapy, side effects and monitoring parameters, warnings and precautions, drug/food interactions and storage, handling precautions, and disposal because they have taken the medication previously. The information in the declined sections below are for informational purposes only and was not discussed with patient.     Myfortic (mycophenolic acid)    Medication & Administration     Dosage:   ??? Take 4 tablets two times a day.    Administration:   ??? Take with or without food, although taking with food helps minimize GI side effects.  ??? Swallow the pills whole, do not chew or crush    Adherence/Missed dose instructions:  ??? Take a missed dose as soon as you think about it.  ??? If it is less than 2 hours until your next dose, skip the missed dose and go back to your normal time.  ??? Do not take 2 doses at the same time or extra doses.    Goals of Therapy     ??? To prevent organ rejection    Side Effects & Monitoring Parameters     ??? Common side effects  ??? Back or joint pain  ??? Constipation  ??? Headache/dizziness  ??? Not hungry  ??? Stomach pain, diarrhea, constipation, gas, upset stomach, vomiting, nausea  ??? Feeling tired or weak  ??? Shakiness  ??? Trouble sleeping  ??? Increased risk of infection    ??? The following side effects should be reported to the provider:  ??? Allergic reaction  ??? High blood sugar (confusion, feeling sleepy, more thirst, more hungry, passing urine more often, flushing, fast breathing, or breath that smells like fruit)  ??? Electrolyte issues (mood changes, confusion, muscle pain or weakness, a heartbeat that does not feel normal, seizures, not hungry, or very bad upset stomach or throwing up)  ??? High or low blood pressure (bad headache or dizziness, passing out, or change in eyesight)  ??? Kidney issues (unable to pass urine, change in how much urine is passed, blood in the urine, or a big weight gain)  ??? Skin (oozing, heat, swelling, redness, or pain), UTI and other infections   ??? Chest pain or pressure  ??? Abnormal heartbeat  ??? Unexplained bleeding or bruising  ??? Abnormal burning, numbness, or tingling  ??? Muscle cramps,  ??? Yellowing of skin or eyes    ??? Monitoring parameters  ??? Pregnancy test initially prior to treatment and 8-10 days later then as needed)  ??? CBC weekly for first month then twice monthly for next 2 months, then monthly)  ??? Monitor Renal and liver functions  ??? Signs of organ rejection    Contraindications, Warnings, & Precautions     ??? *This is a REMS drug and an FDA-approved patient medication guide will be printed with each dispensation  ??? Black Box Warning: Infections   ??? Black Box Warning: Lymphoproliferative disorders - risk of development of lymphoma  and skin malignancy is increased  ??? Black Box Warning: Use during pregnancy is associated with increased risks of first trimester pregnancy loss and congenital malformations.   ??? Black Box Warning: Females of reproductive potential should use contraception during treatment and for 6 weeks after therapy is discontinued  ??? CNS depression  ??? New or reactivated viral infections  ??? Neutropenia  ??? Female patients and/or their female partners should use effective contraception during treatment of the female patient and for at least 3 months after last dose.  ??? Breastfeeding is not recommended during therapy and for 6 weeks after last dose    Drug/Food Interactions     ??? Medication list reviewed in Epic. The patient was instructed to inform the care team before taking any new medications or supplements. No drug interactions identified.   ??? Do not take Echinacea while on this medication  ??? Check with your doctor before getting any vaccinations (live or inactivated)    Storage, Handling Precautions, & Disposal     ??? Store at room temperature  ??? Keep away from children and pets  ??? This drug is considered hazardous and should be handled as little as possible.  Wash hands before and after touching pills. If someone else helps with medication administration, they should wear gloves.      Current Medications (including OTC/herbals), Comorbidities and Allergies     Current Outpatient Medications   Medication Sig Dispense Refill   ??? acetaminophen (TYLENOL) 500 MG tablet Take 1-2 tablets (500-1,000 mg total) by mouth every six (6) hours as needed for pain or fever greater than 100.26F (38C). 100 tablet 0   ??? amLODIPine (NORVASC) 5 MG tablet Take 1 tablet (5 mg total) by mouth daily. HOLD 30 tablet 11   ??? aspirin (ECOTRIN) 81 MG tablet Take 1 tablet (81 mg total) by mouth daily. 30 tablet 11   ??? biotin 5 mg tablet Take 1 tablet (5 mg total) by mouth daily. 30 tablet 11   ??? carvediloL (COREG) 12.5 MG tablet Take 1 tablet (12.5 mg total) by mouth two (2) times a day. 60 tablet 11   ??? docusate sodium (COLACE) 100 MG capsule Take 1 capsule (100 mg total) by mouth two (2) times a day as needed for constipation. 30 capsule 2   ??? gabapentin (NEURONTIN) 100 MG capsule Take 1 capsule (100 mg total) by mouth three (3) times a day. 90 capsule 11   ??? magnesium oxide-Mg AA chelate (MAGNESIUM, AMINO ACID CHELATE,) 133 mg Tab Take 1 tablet by mouth two (2) times a day. HOLD until directed to start by your coordinator. 100 tablet 11   ??? metoclopramide (REGLAN) 10 MG tablet Take 1 tablet (10 mg total) by mouth four (4) times a day (before meals and nightly). 120 tablet 0   ??? multivitamin, prenatal, folic acid-iron, 27-1 mg Tab Take 1 tablet by mouth daily.     ??? mycophenolate (MYFORTIC) 180 MG EC tablet Take 4 tablets (720 mg total) by mouth two (2) times a day. Adjust dose per medication card. 180 tablet 11   ??? oxyCODONE (ROXICODONE) 5 MG immediate release tablet Take 1-2 tablets (5-10 mg total) by mouth every six (6) hours as needed for pain. 40 tablet 0   ??? pantoprazole (PROTONIX) 40 MG tablet Take 1 tablet (40 mg total) by mouth daily. 30 tablet 11   ??? sulfamethoxazole-trimethoprim (BACTRIM) 400-80 mg per tablet Take 1 tablet (80 mg of trimethoprim total) by mouth every Monday, Wednesday,  and Friday. 12 tablet 5   ??? tacrolimus (PROGRAF) 1 MG capsule Take 9 capsules (9 mg) by mouth in the morning and 8 capsules (8 mg) in the evening 510 capsule 11   ??? valGANciclovir (VALCYTE) 450 mg tablet Take 1 tablet (450 mg total) by mouth daily. 30 tablet 2     No current facility-administered medications for this visit.        Allergies   Allergen Reactions   ??? Iodinated Contrast Media Swelling, Rash and Other (See Comments)     Burning, warmth throughout body and tingling.  Throat swelling.  Treated with benadryl and symptoms improved.  Has not had contrast since then (2013 or 2014).   ??? Nickel Rash   ??? Propranolol Swelling   ??? Eye Irrigating Solution [Ophthalmic Irrigation Solution] Other (See Comments)     Contrast dye (name?) used in eyes caused hot feeling in face, reversed with Benadryl.        Patient Active Problem List   Diagnosis   ??? Anemia, chronic disease   ??? Diabetic nephropathy (CMS-HCC)   ??? Diabetic retinopathy (CMS-HCC)   ??? Nephrotic syndrome   ??? Type 1 diabetes mellitus (CMS-HCC)   ??? Sickle cell trait (CMS-HCC)   ??? Other secondary hypertension   ??? History of blood transfusion   ??? Cardiomyopathy (CMS-HCC)   ??? History of simultaneous kidney and pancreas transplant (CMS-HCC)       Reviewed and up to date in Epic.    Appropriateness of Therapy     Is medication and dose appropriate based on diagnosis? Yes    Prescription has been clinically reviewed: Yes    Baseline Quality of Life Assessment      How many days over the past month did your pancreas-kidney transplant  keep you from your normal activities? For example, brushing your teeth or getting up in the morning. 0 since discharge from hospital.    Financial Information     Medication Assistance provided: None Required    Anticipated copay of $0-Tacrolimus, $0-Mycophenolate, $1.30-Valganciclovir reviewed with patient. Verified delivery address.    Delivery Information     Scheduled delivery date: Patient currently has 2 weeks of medicine on hand and does not need anything today.    Expected start date: Patient has medication on hand at home and is currently taking.    Medication will be delivered via UPS to the prescription address in [facility].  This shipment will require a signature.      Explained the services we provide at [facility] and that each month we would call to set up refills.  Stressed importance of returning phone calls so that we could ensure they receive their medications in time each month.  Informed patient that we should be setting up refills 7-10 days prior to when they will run out of medication.  A pharmacist will reach out to perform a clinical assessment periodically.  Informed patient that a welcome packet and a drug information handout will be sent.      Patient verbalized understanding of the above information as well as how to contact the pharmacy at [phone] option 4 with any questions/concerns.  The pharmacy is open Monday through Friday 8:30am-4:30pm.  A pharmacist is available 24/7 via pager to answer any clinical questions they may have.    Patient Specific Needs     - Does the patient have any physical, cognitive, or cultural barriers? No    - Patient prefers to  have medications discussed with  Patient     - Is the patient or caregiver able to read and understand education materials at a high school level or above? Yes    - Patient's primary language is  English     - Is the patient high risk? Yes, patient is taking a REMS drug. Medication is dispensed in compliance with REMS program.     - Does the patient require a Care Management Plan? No     - Does the patient require physician intervention or other additional services (i.e. nutrition, smoking cessation, social work)? No      Tera Helper  [facility] Specialty Pharmacist

## 2019-05-19 NOTE — Unmapped (Signed)
Pt advised to increase prograf dose to 9mg  am and 8 mg pm

## 2019-05-19 NOTE — Unmapped (Signed)
[facility] Specialty Medication Onboarding    Specialty Medication: Tacrolimus  Prior Authorization: Not Required   Financial Assistance: No - copay  <$25  Final Copay/Day Supply: $0.00 / 30    Insurance Restrictions: Yes - max 1 month supply     Notes to Pharmacist: N/A    The triage team has completed the benefits investigation and has determined that the patient is able to fill this medication at [facility]. Please contact the patient to complete the onboarding or follow up with the prescribing physician as needed.

## 2019-05-20 ENCOUNTER — Ambulatory Visit
Admit: 2019-05-20 | Discharge: 2019-05-20 | Payer: MEDICARE | Attending: Student in an Organized Health Care Education/Training Program | Primary: Student in an Organized Health Care Education/Training Program

## 2019-05-20 ENCOUNTER — Ambulatory Visit: Admit: 2019-05-20 | Discharge: 2019-05-20 | Payer: MEDICARE

## 2019-05-20 ENCOUNTER — Institutional Professional Consult (permissible substitution): Admit: 2019-05-20 | Discharge: 2019-05-20 | Payer: MEDICARE

## 2019-05-20 DIAGNOSIS — Z79899 Other long term (current) drug therapy: Principal | ICD-10-CM

## 2019-05-20 DIAGNOSIS — Z940 Kidney transplant status: Principal | ICD-10-CM

## 2019-05-20 DIAGNOSIS — Z9483 Pancreas transplant status: Principal | ICD-10-CM

## 2019-05-20 LAB — LIPID PANEL
CHOLESTEROL/HDL RATIO SCREEN: 2.7 (ref <5.0)
HDL CHOLESTEROL: 45 mg/dL (ref 40–59)
LDL CHOLESTEROL CALCULATED: 63 mg/dL (ref 60–99)
NON-HDL CHOLESTEROL: 76 mg/dL

## 2019-05-20 LAB — URINALYSIS
BILIRUBIN UA: NEGATIVE
BLOOD UA: NEGATIVE
GLUCOSE UA: NEGATIVE
KETONES UA: NEGATIVE
LEUKOCYTE ESTERASE UA: NEGATIVE
NITRITE UA: NEGATIVE
PROTEIN UA: NEGATIVE
RBC UA: 2 /HPF (ref <=4)
SPECIFIC GRAVITY UA: 1.016 (ref 1.003–1.030)
SQUAMOUS EPITHELIAL: 14 /HPF — ABNORMAL HIGH (ref 0–5)
UROBILINOGEN UA: 0.2
WBC UA: 8 /HPF — ABNORMAL HIGH (ref 0–5)

## 2019-05-20 LAB — CBC W/ AUTO DIFF
BASOPHILS ABSOLUTE COUNT: 0 10*9/L (ref 0.0–0.1)
EOSINOPHILS ABSOLUTE COUNT: 0.2 10*9/L (ref 0.0–0.4)
EOSINOPHILS RELATIVE PERCENT: 2.5 %
HEMATOCRIT: 31 % — ABNORMAL LOW (ref 36.0–46.0)
HEMOGLOBIN: 9.8 g/dL — ABNORMAL LOW (ref 12.0–16.0)
LARGE UNSTAINED CELLS: 1 % (ref 0–4)
LYMPHOCYTES ABSOLUTE COUNT: 0.1 10*9/L — ABNORMAL LOW (ref 1.5–5.0)
LYMPHOCYTES RELATIVE PERCENT: 1.3 %
MEAN CORPUSCULAR HEMOGLOBIN CONC: 31.5 g/dL (ref 31.0–37.0)
MEAN CORPUSCULAR VOLUME: 96.6 fL (ref 80.0–100.0)
MEAN PLATELET VOLUME: 8.1 fL (ref 7.0–10.0)
MONOCYTES ABSOLUTE COUNT: 0.2 10*9/L (ref 0.2–0.8)
MONOCYTES RELATIVE PERCENT: 2.8 %
NEUTROPHILS ABSOLUTE COUNT: 7.8 10*9/L — ABNORMAL HIGH (ref 2.0–7.5)
NEUTROPHILS RELATIVE PERCENT: 92.6 %
PLATELET COUNT: 349 10*9/L (ref 150–440)
RED CELL DISTRIBUTION WIDTH: 15.5 % — ABNORMAL HIGH (ref 12.0–15.0)
WBC ADJUSTED: 8.4 10*9/L (ref 4.5–11.0)

## 2019-05-20 LAB — COMPREHENSIVE METABOLIC PANEL
ALBUMIN: 3 g/dL — ABNORMAL LOW (ref 3.5–5.0)
ALKALINE PHOSPHATASE: 82 U/L (ref 38–126)
ALT (SGPT): 42 U/L — ABNORMAL HIGH (ref <35)
ANION GAP: 9 mmol/L (ref 7–15)
AST (SGOT): 42 U/L — ABNORMAL HIGH (ref 14–38)
BILIRUBIN TOTAL: 0.4 mg/dL (ref 0.0–1.2)
BLOOD UREA NITROGEN: 21 mg/dL (ref 7–21)
BUN / CREAT RATIO: 14
CALCIUM: 9.1 mg/dL (ref 8.5–10.2)
CHLORIDE: 109 mmol/L — ABNORMAL HIGH (ref 98–107)
CO2: 23 mmol/L (ref 22.0–30.0)
CREATININE: 1.49 mg/dL — ABNORMAL HIGH (ref 0.60–1.00)
EGFR CKD-EPI AA FEMALE: 54 mL/min/{1.73_m2} — ABNORMAL LOW (ref >=60)
EGFR CKD-EPI NON-AA FEMALE: 46 mL/min/{1.73_m2} — ABNORMAL LOW (ref >=60)
POTASSIUM: 4.8 mmol/L (ref 3.5–5.0)
PROTEIN TOTAL: 6 g/dL — ABNORMAL LOW (ref 6.5–8.3)
SODIUM: 141 mmol/L (ref 135–145)

## 2019-05-20 LAB — CALCIUM: Calcium:MCnc:Pt:Ser/Plas:Qn:: 9.1

## 2019-05-20 LAB — PROTEIN/CREAT RATIO, URINE: Protein/Creatinine:MRto:Pt:Urine:Qn:: 0.181

## 2019-05-20 LAB — LIPASE: Triacylglycerol lipase:CCnc:Pt:Ser/Plas:Qn:: 42 — ABNORMAL LOW

## 2019-05-20 LAB — PHOSPHORUS: Phosphate:MCnc:Pt:Ser/Plas:Qn:: 3.3

## 2019-05-20 LAB — NEUTROPHILS RELATIVE PERCENT: Neutrophils/100 leukocytes:NFr:Pt:Bld:Qn:Automated count: 92.6

## 2019-05-20 LAB — PROTEIN / CREATININE RATIO, URINE
PROTEIN URINE: 23.5 mg/dL
PROTEIN/CREAT RATIO, URINE: 0.181

## 2019-05-20 LAB — TACROLIMUS, TROUGH
Lab: 5.6
Lab: 7

## 2019-05-20 LAB — AMYLASE: Amylase:CCnc:Pt:Ser/Plas:Qn:: 115 — ABNORMAL HIGH

## 2019-05-20 LAB — MAGNESIUM: Magnesium:MCnc:Pt:Ser/Plas:Qn:: 1.5 — ABNORMAL LOW

## 2019-05-20 LAB — LDL CHOLESTEROL CALCULATED: Cholesterol.in LDL:MCnc:Pt:Ser/Plas:Qn:Calculated: 63

## 2019-05-20 LAB — MUCUS

## 2019-05-20 MED ORDER — METOCLOPRAMIDE 10 MG TABLET
ORAL_TABLET | Freq: Three times a day (TID) | ORAL | 0 refills | 30 days | Status: CP
Start: 2019-05-20 — End: 2019-06-19

## 2019-05-20 MED ORDER — TACROLIMUS 1 MG CAPSULE, IMMEDIATE-RELEASE
ORAL_CAPSULE | Freq: Two times a day (BID) | ORAL | 11 refills | 30 days | Status: CP
Start: 2019-05-20 — End: 2020-05-19

## 2019-05-20 NOTE — Unmapped (Signed)
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 She is recovering well.    - Pull left JP drain today  - Return to clinic in 2 weeks. Will likely pull R JP at that time  - DC antihypertensives due to episodic symptomatic hypotension  - Stop miralax/colace  - Encouraged to increased fluid intake to help drop Creatinine    Subjective  Elaine Koch is a 34 y.o. 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 There were no complications intra-operatively, and she tolerated the procedure well. She presents today for first postoperative visit since discharge. She remains on a post-transplant ppx regimen of prograf and myfortic, as well as valgancyclovir. She reports pain well controlled on mostly tylenol, only requiring oxycodone occasionally during the week. Her pain is mostly at the site of her drains. She has been diligently documenting drain output BID. Drain output has been serous. She reports some dyspnea with exertion and states that her energy level has not quite returned, however her appetite has significantly improved and she is eating three meals daily without nausea or vomiting. She is conscientious about maximizing fluid intake. She does report intermittent hypotension to 80s/40's with associated dizziness and headaches. She reports chronic loose stools, unchanged from preop status, without changes in color. Her blood glucose has been well controlled without medication, max POCs in 120s.      Objective  Vitals:    05/20/19 0925   BP: 109/66   Pulse: 88   Temp: 37.2 ??C   TempSrc: Tympanic   Weight: 62.9 kg (138 lb 11.2 oz)   Height: 165.1 cm (5' 5)      Body mass index is 23.08 kg/m??.     Physical Exam:    General Appearance:   No acute distress  Lungs:                Non-labored breathing on room air  Heart:                           Regular rate and rhythm Abdomen:                Soft, appropriately-tender, non-distended. Right and left JP with serous output, no surrounding erythema  Extremities:              Warm and well perfused  Incision:    Midline laparotomy stapled, c/d/i, no surrounding erythema      Data Review:  All lab results last 24 hours:    Recent Results (from the past 24 hour(s))   Lipase Level    Collection Time: 05/20/19  8:57 AM   Result Value Ref Range    Lipase 42 (L) 44 - 232 U/L   Amylase Level    Collection Time: 05/20/19  8:57 AM   Result Value Ref Range    Amylase 115 (H) 30 - 110 U/L   Magnesium Level    Collection Time: 05/20/19  8:57 AM   Result Value Ref Range    Magnesium 1.5 (L) 1.6 - 2.2 mg/dL   Phosphorus Level    Collection Time: 05/20/19  8:57 AM   Result Value Ref Range    Phosphorus 3.3 2.9 - 4.7 mg/dL   Comprehensive Metabolic Panel    Collection Time: 05/20/19  8:57 AM   Result Value Ref  Range    Sodium 141 135 - 145 mmol/L    Potassium 4.8 3.5 - 5.0 mmol/L    Chloride 109 (H) 98 - 107 mmol/L    Anion Gap 9 7 - 15 mmol/L    CO2 23.0 22.0 - 30.0 mmol/L    BUN 21 7 - 21 mg/dL    Creatinine 1.61 (H) 0.60 - 1.00 mg/dL    BUN/Creatinine Ratio 14     EGFR CKD-EPI Non-African American, Female 46 (L) >=60 mL/min/1.61m2    EGFR CKD-EPI African American, Female 54 (L) >=60 mL/min/1.46m2    Glucose 97 70 - 99 mg/dL    Calcium 9.1 8.5 - 09.6 mg/dL    Albumin 3.0 (L) 3.5 - 5.0 g/dL    Total Protein 6.0 (L) 6.5 - 8.3 g/dL    Total Bilirubin 0.4 0.0 - 1.2 mg/dL    AST 42 (H) 14 - 38 U/L    ALT 42 (H) <35 U/L    Alkaline Phosphatase 82 38 - 126 U/L   CBC w/ Differential    Collection Time: 05/20/19  8:57 AM   Result Value Ref Range    WBC 8.4 4.5 - 11.0 10*9/L    RBC 3.21 (L) 4.00 - 5.20 10*12/L    HGB 9.8 (L) 12.0 - 16.0 g/dL    HCT 04.5 (L) 40.9 - 46.0 %    MCV 96.6 80.0 - 100.0 fL    MCH 30.5 26.0 - 34.0 pg    MCHC 31.5 31.0 - 37.0 g/dL    RDW 81.1 (H) 91.4 - 15.0 %    MPV 8.1 7.0 - 10.0 fL    Platelet 349 150 - 440 10*9/L    Neutrophils % 92.6 %    Lymphocytes % 1.3 %    Monocytes % 2.8 %    Eosinophils % 2.5 %    Basophils % 0.3 %    Neutrophil Left Shift 1+ (A) Not Present    Absolute Neutrophils 7.8 (H) 2.0 - 7.5 10*9/L    Absolute Lymphocytes 0.1 (L) 1.5 - 5.0 10*9/L    Absolute Monocytes 0.2 0.2 - 0.8 10*9/L    Absolute Eosinophils 0.2 0.0 - 0.4 10*9/L    Absolute Basophils 0.0 0.0 - 0.1 10*9/L    Large Unstained Cells 1 0 - 4 %    Macrocytosis Slight (A) Not Present    Hypochromasia Slight (A) Not Present       Imaging: No new

## 2019-05-20 NOTE — Unmapped (Signed)
[facility] HOSPITALS TRANSPLANT CLINIC PHARMACY NOTE  05/20/2019   Elaine Koch  [id number]    Medication changes today:   1.  Stop carvedilol  2. Decrease metoclopramide to 10 mg TID AC  3. Increase tacrolimus to 9 mg BID    Education/Adherence tools provided today:  1.provided updated medication list  2. provided additional pill box education  3.  provided additional education on immunosuppression and transplant related medications including reviewing indications of medications, dosing and side effects    Follow up items:  1. goal of understanding indications and dosing of immunosuppression medications  2. Consider further taper of metoclopramide at next visit  3. Consider stopping BG checks at next visit if lipase/amylase remain WNL    Next visit with pharmacy in 1-2 weeks  ____________________________________________________________________    Elaine Koch is a 34 y.o. 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     Other PMH significant for hypertension, sickle cell trait positive, retinopathy, preeclampsia     Seen by pharmacy today for: medication management and pill box fill and adherence education; last seen by pharmacy first visit     CC:  Patient complains of  drain discomfort    There were no vitals filed for this visit.    Allergies   Allergen Reactions   ??? Iodinated Contrast Media Swelling, Rash and Other (See Comments)     Burning, warmth throughout body and tingling.  Throat swelling.  Treated with benadryl and symptoms improved.  Has not had contrast since then (2013 or 2014).   ??? Nickel Rash   ??? Propranolol Swelling   ??? Eye Irrigating Solution [Ophthalmic Irrigation Solution] Other (See Comments)     Contrast dye (name?) used in eyes caused hot feeling in face, reversed with Benadryl.        Medications reviewed in EPIC medication station and updated today by the clinical pharmacist practitioner.    Outpatient Encounter Medications as of 05/20/2019 Medication Sig Dispense Refill   ??? acetaminophen (TYLENOL) 500 MG tablet Take 1-2 tablets (500-1,000 mg total) by mouth every six (6) hours as needed for pain or fever greater than 100.65F (38C). 100 tablet 0   ??? amLODIPine (NORVASC) 5 MG tablet Take 1 tablet (5 mg total) by mouth daily. HOLD 30 tablet 11   ??? aspirin (ECOTRIN) 81 MG tablet Take 1 tablet (81 mg total) by mouth daily. 30 tablet 11   ??? biotin 5 mg tablet Take 1 tablet (5 mg total) by mouth daily. 30 tablet 11   ??? carvediloL (COREG) 12.5 MG tablet Take 1 tablet (12.5 mg total) by mouth two (2) times a day. 60 tablet 11   ??? docusate sodium (COLACE) 100 MG capsule Take 1 capsule (100 mg total) by mouth two (2) times a day as needed for constipation. 30 capsule 2   ??? gabapentin (NEURONTIN) 100 MG capsule Take 1 capsule (100 mg total) by mouth three (3) times a day. 90 capsule 11   ??? magnesium oxide-Mg AA chelate (MAGNESIUM, AMINO ACID CHELATE,) 133 mg Tab Take 1 tablet by mouth two (2) times a day. HOLD until directed to start by your coordinator. 100 tablet 11   ??? metoclopramide (REGLAN) 10 MG tablet Take 1 tablet (10 mg total) by mouth four (4) times a day (before meals and nightly). 120 tablet 0   ??? multivitamin, prenatal, folic acid-iron, 27-1 mg Tab Take 1 tablet by mouth daily.     ??? mycophenolate (MYFORTIC)  180 MG EC tablet Take 4 tablets (720 mg total) by mouth two (2) times a day. Adjust dose per medication card. 180 tablet 11   ??? oxyCODONE (ROXICODONE) 5 MG immediate release tablet Take 1-2 tablets (5-10 mg total) by mouth every six (6) hours as needed for pain. 40 tablet 0   ??? pantoprazole (PROTONIX) 40 MG tablet Take 1 tablet (40 mg total) by mouth daily. 30 tablet 11   ??? sulfamethoxazole-trimethoprim (BACTRIM) 400-80 mg per tablet Take 1 tablet (80 mg of trimethoprim total) by mouth every Monday, Wednesday, and Friday. 12 tablet 5   ??? tacrolimus (PROGRAF) 1 MG capsule Take 9 capsules (9 mg) by mouth in the morning and 8 capsules (8 mg) in the evening 510 capsule 11   ??? valGANciclovir (VALCYTE) 450 mg tablet Take 1 tablet (450 mg total) by mouth daily. 30 tablet 2     No facility-administered encounter medications on file as of 05/20/2019.      Induction agent : alemtuzumab    CURRENT IMMUNOSUPPRESSION: tacrolimus 9 mg QAM and 8 mg qPM mg PO    prograf/Envarsus/cyclosporine goal: 8-10   [id number]  mg PO bid    steroid free     Patient is tolerating immunosuppression well    IMMUNOSUPPRESSION DRUG LEVELS:  Lab Results   Component Value Date    Tacrolimus, Trough 5.2 05/14/2019    Tacrolimus, Trough 4.2 (L) 05/12/2019    Tacrolimus, Trough 5.3 05/11/2019     No results found for: CYCLO  No results found for: EVEROLIMUS  No results found for: SIROLIMUS    Prograf level is accurate 12 hour trough    Graft function: stable  DSA: ntd  Zero hour biopsy: no abnormalities  Biopsies to date: ntd  UPC: 0.382  WBC/ANC:  wnl    Plan: Will increase tacrolimus to 9 mg BID. Continue to monitor.    OI Prophylaxis:   CMV Status: D+/ R+, moderate risk . CMV prophylaxis: valganciclovir 450 mg daily x 3 months per protocol.  No results found for: CMVCP  PCP Prophylaxis: bactrim SS 1 tab MWF x 6 months.  Thrush: completed in hospital  Patient is  tolerating infectious prophylaxis well    Plan: Continue per protocol. Continue to monitor.    CV Prophylaxis: asa 81 mg   The ASCVD Risk score Denman George DC Montez Hageman, et al., 2013) failed to calculate.  Statin therapy: Not indicated; currently on no statin  Plan: Continue to monitor     BP: Goal < 140/90. Clinic vitals reported above  Home BP ranges: 80-90/50s - reports lightheadedness  Current meds include: carvedilol 12.5 mg BID  Plan: out of goal stop carvedilol. Continue to monitor    Anemia of CKD:  H/H:   Lab Results   Component Value Date    HGB 10.0 (L) 05/17/2019     Lab Results   Component Value Date    HCT 30.2 (L) 05/17/2019     Iron panel:  No results found for: IRON, TIBC, FERRITIN  No results found for: LABIRON    Prior ESA use: none post transplant    Plan: stable. Continue to monitor.     DM:   Lab Results   Component Value Date    A1C 7.3 (H) 05/17/2019   . Goal A1c < 7  History of Dm? Yes: T1DM  Currently on: no insulin post pancreas transplant  Home BS log:  Fasting AM:108, 105, 103, 100  Pre- Noon: 107, 99, 101  Pre dinner:102, 118, 100  HS:112, 115, 100  Diet:good appetite  Exercise:not yet  Hypoglycemia: no  Plan:  Consider stopping BG checks at next visit    Fluid Intake: 80-90 oz daily    Electrolytes: wnl  Meds currently on: none  Plan: Continue to monitor     GI/BM: pt reports occasional food related diarrhea  Meds currently on: docusate PRN (not using), Miralax PRN (not using), metoclopramide 10 mg QID AC, MVI  Plan: Decrease metoclopramide to 10mg  TID AC.  Consider decreasing further at next visit (was not on pre transplant). Continue to monitor    Pain: pt reports mild pain  Meds currently on: APAP PRN (using 3-4 times since DC), oxycodone PRN (using 2-3 times since DC), gabapentin 100 mg TID (not on pre transplant)  Plan: Consider stopping gabapentin at next visit. Continue to monitor    Bone health:   Vitamin D Level: pending. Goal > 30.   Last DEXA results:  none available  Current meds include: none  Plan: Vitamin D level  pending. Continue to monitor.     Women's/Men's Health:  Elaine Koch is a 34 y.o. Female of childbearing age. Patient reports no men's/women's health issues.  No longer has an IUD  Plan: Emphasized importance of contraception if/when she is sexually active.  Continue to monitor    Hair thinning  Meds currently on: biotin 5 mg daily  Plan: continue to monitor    Pharmacy preference:  [facility]    Adherence: Patient has poor understanding of medications; was able to independently identify names/doses of immunosuppressants and OI meds.  Patient  does fill their own pill box on a regular basis at home.  Patient brought medication card:yes  Pill box:was correct  Plan: provided extensive adherence counseling/intervention    Patient was reviewed with Dr. Matilde Haymaker who was agreement with the stated plan:     During this visit, the following was completed:   BG log data assessment  BP log data assessment  Labs ordered and evaluated  complex treatment plan >1 DS   I spent a total of 30 minutes face to face with the patient delivering clinical care and providing education/counseling.    All questions/concerns were addressed to the patient's satisfaction.  __________________________________________  Cleone Slim, PHARMD, CPP  [facility] [employer] [facility] CLINICAL PHARMACIST PRACTITIONER  PAGER [phone]

## 2019-05-20 NOTE — Unmapped (Signed)
Urine was collected and sent to the lab.

## 2019-05-20 NOTE — Unmapped (Signed)
Transplant Coordinator, Clinic Visit   Pt seen today by transplant nephrology for follow up, reviewed medications and symptoms. Pt doing well today, states she is very tired.          05/20/19 0925   BP: 109/66   Pulse: 88   Temp: 37.2 ??C (99 ??F)   Weight: 62.9 kg (138 lb 11.2 oz)   Height: 165.1 cm (5' 5)   PainSc: 0-No pain       Assessment  BP: 86/50-103/56 at home, pt lightheaded at times  BG: 90-113  Headache: occasional  Hand tremors: no  Numbness/tingling: no  Fevers: no  Chills/sweats: no  Shortness of breath: no  Chest pain or pressure: no  Palpitations: no  Nausea/vomiting: no  Diarrhea/constipation: occasional constipation  UTI symptoms: no  Swelling: no  Pain: at drain site, taking Tylenol and Oxycodone PRN  Incision: C/D/I, staples remain in place  Drain: unable to visualize insertion site, Drain 2 output: 4-5oz per day and Drain 3 output: 1-2oz per day    Good appetite; reports adequate hydration.     Intake: 4- 24oz bottles of water    Any new medications? no  Immunosuppressant last taken: 9pm last night    Per L. Mincemoyer Pharm D: STOP Carvedilol and call if BP remains low, STOP nightly dose of Metoclopramide, Left JP drain removed.     I spent a total of 30 minutes with Elaine Koch reviewing medications and symptoms.

## 2019-05-20 NOTE — Unmapped (Signed)
Pt advised to increase prograf to 9 mg BID. Last prograf level 6.0.

## 2019-05-21 ENCOUNTER — Other Ambulatory Visit: Payer: Medicare Other

## 2019-05-21 LAB — CMV QUANT LOG10: Lab: 0

## 2019-05-21 LAB — CMV DNA, QUANTITATIVE, PCR

## 2019-05-21 NOTE — Unmapped (Signed)
Dose change - Tacrolimus 1mg  capsule  Copay - $0 for 30 days pert Part B/ Medicaid  Last filled on 05/11/19  Provider also sent in Metoclopramide 10mg  tablets, but RFTS until 06/02/19.

## 2019-05-24 DIAGNOSIS — Z940 Kidney transplant status: Principal | ICD-10-CM

## 2019-05-24 DIAGNOSIS — Z9483 Pancreas transplant status: Principal | ICD-10-CM

## 2019-05-24 DIAGNOSIS — E1029 Type 1 diabetes mellitus with other diabetic kidney complication: Principal | ICD-10-CM

## 2019-05-25 DIAGNOSIS — Z940 Kidney transplant status: Principal | ICD-10-CM

## 2019-05-25 DIAGNOSIS — Z9483 Pancreas transplant status: Principal | ICD-10-CM

## 2019-05-25 LAB — MAGNESIUM: Lab: 1.5

## 2019-05-25 LAB — CBC W/ DIFFERENTIAL
BASOPHILS ABSOLUTE COUNT: 0.4 10*9/L
BASOPHILS RELATIVE PERCENT: 0.8 %
EOSINOPHILS ABSOLUTE COUNT: 0.5 10*9/L
EOSINOPHILS RELATIVE PERCENT: 10.3 %
HEMATOCRIT: 29 % — ABNORMAL LOW
HEMOGLOBIN: 9.4 g/dL — ABNORMAL LOW
LYMPHOCYTES ABSOLUTE COUNT: 0.4 10*9/L — ABNORMAL LOW
LYMPHOCYTES RELATIVE PERCENT: 0.8 %
MEAN CORPUSCULAR HEMOGLOBIN CONC: 32.4 g/dL
MEAN CORPUSCULAR HEMOGLOBIN: 29.9 pg
MEAN CORPUSCULAR VOLUME: 92.4 fL
MEAN PLATELET VOLUME: 10.2 fL
MONOCYTES RELATIVE PERCENT: 6.1 %
NEUTROPHILS ABSOLUTE COUNT: 3.9 10*9/L
NEUTROPHILS RELATIVE PERCENT: 82 %
PLATELET COUNT: 303 10*9/L
RED BLOOD CELL COUNT: 3.14 10*12/L — ABNORMAL LOW
RED CELL DISTRIBUTION WIDTH: 14.4 %
WHITE BLOOD CELL COUNT: 4.8 10*9/L

## 2019-05-25 LAB — BASIC METABOLIC PANEL
CALCIUM: 8.7 mg/dL
CHLORIDE: 108 mmol/L
CO2: 21 mmol/L
CREATININE: 1.36 mg/dL — ABNORMAL HIGH
POTASSIUM: 5.1 mmol/L

## 2019-05-25 LAB — VITAMIN D 1,25-DIHYDROXY: 1,25-Dihydroxyvitamin D:MCnc:Pt:Ser/Plas:Qn:: 18

## 2019-05-25 LAB — HEMOGLOBIN A1C

## 2019-05-25 LAB — ESTIMATED AVERAGE GLUCOSE: Lab: 0

## 2019-05-25 LAB — WHITE BLOOD CELL COUNT: Lab: 4.8

## 2019-05-25 LAB — ALBUMIN: Lab: 3 — ABNORMAL LOW

## 2019-05-25 LAB — CREATININE: Lab: 1.36 — ABNORMAL HIGH

## 2019-05-25 LAB — LIPASE: Lab: 16

## 2019-05-25 LAB — AMYLASE: Lab: 86

## 2019-05-25 LAB — VITAMIN D 1,25 DIHYDROXY: VITAMIN D 1,25-DIHYDROXY: 18 pg/mL

## 2019-05-25 LAB — PHOSPHORUS: Lab: 2 — ABNORMAL LOW

## 2019-05-26 ENCOUNTER — Ambulatory Visit: Payer: Medicare Other | Admitting: Endocrinology

## 2019-05-26 DIAGNOSIS — Z9483 Pancreas transplant status: Principal | ICD-10-CM

## 2019-05-26 DIAGNOSIS — Z940 Kidney transplant status: Principal | ICD-10-CM

## 2019-05-26 DIAGNOSIS — E559 Vitamin D deficiency, unspecified: Principal | ICD-10-CM

## 2019-05-26 DIAGNOSIS — E119 Type 2 diabetes mellitus without complications: Principal | ICD-10-CM

## 2019-05-26 DIAGNOSIS — Z79899 Other long term (current) drug therapy: Principal | ICD-10-CM

## 2019-05-26 LAB — HLA DS POST TRANSPLANT
ANTI-DONOR DRW #2 MFI: 131 MFI
ANTI-DONOR HLA-A #1 MFI: 36 MFI
ANTI-DONOR HLA-A #2 MFI: 22 MFI
ANTI-DONOR HLA-B #1 MFI: 36 MFI
ANTI-DONOR HLA-B #2 MFI: 81 MFI
ANTI-DONOR HLA-C #2 MFI: 225 MFI
ANTI-DONOR HLA-DP AG #1 MFI: 110 MFI
ANTI-DONOR HLA-DQB #1 MFI: 0 MFI
ANTI-DONOR HLA-DQB #2 MFI: 34 MFI
ANTI-DONOR HLA-DR #1 MFI: 108 MFI
ANTI-DONOR HLA-DR #2 MFI: 147 MFI

## 2019-05-26 LAB — DONOR HLA-DP ANTIGEN #1

## 2019-05-26 LAB — FSAB CLASS 2 ANTIBODY SPECIFICITY

## 2019-05-26 LAB — FSAB CLASS 1 ANTIBODY SPECIFICITY: HLA CLASS 1 ANTIBODY RESULT: NEGATIVE

## 2019-05-26 LAB — TACROLIMUS, TROUGH: Lab: 6.4

## 2019-05-26 LAB — BK BLOOD QUANT: Lab: 0

## 2019-05-26 LAB — HLA CL1 ANTIBODY COMM: Lab: 0

## 2019-05-26 LAB — BK VIRUS QUANTITATIVE PCR, BLOOD

## 2019-05-26 LAB — HLA CL2 AB RESULT: Lab: NEGATIVE

## 2019-05-26 NOTE — Unmapped (Signed)
Pt advised to increase prograf to 10 mg BID last level 6.4.

## 2019-05-27 ENCOUNTER — Ambulatory Visit: Admit: 2019-05-27 | Discharge: 2019-05-27 | Payer: MEDICARE | Attending: Surgery | Primary: Surgery

## 2019-05-27 ENCOUNTER — Ambulatory Visit: Admit: 2019-05-27 | Discharge: 2019-05-27 | Payer: MEDICARE

## 2019-05-27 ENCOUNTER — Institutional Professional Consult (permissible substitution): Admit: 2019-05-27 | Discharge: 2019-05-27 | Payer: MEDICARE

## 2019-05-27 DIAGNOSIS — Z9483 Pancreas transplant status: Principal | ICD-10-CM

## 2019-05-27 DIAGNOSIS — Z79899 Other long term (current) drug therapy: Principal | ICD-10-CM

## 2019-05-27 DIAGNOSIS — Z940 Kidney transplant status: Principal | ICD-10-CM

## 2019-05-27 LAB — COMPREHENSIVE METABOLIC PANEL
ALBUMIN: 3.1 g/dL — ABNORMAL LOW (ref 3.5–5.0)
ALKALINE PHOSPHATASE: 96 U/L (ref 38–126)
ALT (SGPT): 95 U/L — ABNORMAL HIGH (ref <35)
ANION GAP: 11 mmol/L (ref 7–15)
AST (SGOT): 80 U/L — ABNORMAL HIGH (ref 14–38)
BILIRUBIN TOTAL: 0.4 mg/dL (ref 0.0–1.2)
BLOOD UREA NITROGEN: 16 mg/dL (ref 7–21)
BUN / CREAT RATIO: 13
CALCIUM: 9.2 mg/dL (ref 8.5–10.2)
CHLORIDE: 109 mmol/L — ABNORMAL HIGH (ref 98–107)
CO2: 18 mmol/L — ABNORMAL LOW (ref 22.0–30.0)
CREATININE: 1.25 mg/dL — ABNORMAL HIGH (ref 0.60–1.00)
EGFR CKD-EPI AA FEMALE: 66 mL/min/{1.73_m2} (ref >=60)
EGFR CKD-EPI NON-AA FEMALE: 57 mL/min/{1.73_m2} — ABNORMAL LOW (ref >=60)
GLUCOSE RANDOM: 93 mg/dL (ref 70–99)
POTASSIUM: 5 mmol/L (ref 3.5–5.0)
PROTEIN TOTAL: 6.3 g/dL — ABNORMAL LOW (ref 6.5–8.3)

## 2019-05-27 LAB — CBC W/ AUTO DIFF
BASOPHILS ABSOLUTE COUNT: 0 10*9/L (ref 0.0–0.1)
BASOPHILS RELATIVE PERCENT: 0.6 %
EOSINOPHILS ABSOLUTE COUNT: 0.8 10*9/L — ABNORMAL HIGH (ref 0.0–0.4)
EOSINOPHILS RELATIVE PERCENT: 13.7 %
HEMATOCRIT: 30.3 % — ABNORMAL LOW (ref 36.0–46.0)
HEMOGLOBIN: 9.6 g/dL — ABNORMAL LOW (ref 12.0–16.0)
LARGE UNSTAINED CELLS: 1 % (ref 0–4)
LYMPHOCYTES ABSOLUTE COUNT: 0.1 10*9/L — ABNORMAL LOW (ref 1.5–5.0)
LYMPHOCYTES RELATIVE PERCENT: 1.6 %
MEAN CORPUSCULAR HEMOGLOBIN CONC: 31.8 g/dL (ref 31.0–37.0)
MEAN CORPUSCULAR HEMOGLOBIN: 29.9 pg (ref 26.0–34.0)
MEAN CORPUSCULAR VOLUME: 93.9 fL (ref 80.0–100.0)
NEUTROPHILS ABSOLUTE COUNT: 4.6 10*9/L (ref 2.0–7.5)
NEUTROPHILS RELATIVE PERCENT: 80.6 %
PLATELET COUNT: 491 10*9/L — ABNORMAL HIGH (ref 150–440)
RED BLOOD CELL COUNT: 3.22 10*12/L — ABNORMAL LOW (ref 4.00–5.20)
WBC ADJUSTED: 5.7 10*9/L (ref 4.5–11.0)

## 2019-05-27 LAB — LIPID PANEL
CHOLESTEROL/HDL RATIO SCREEN: 2.8 (ref <5.0)
CHOLESTEROL: 122 mg/dL (ref 100–199)
HDL CHOLESTEROL: 44 mg/dL (ref 40–59)
NON-HDL CHOLESTEROL: 78 mg/dL
TRIGLYCERIDES: 61 mg/dL (ref 1–149)

## 2019-05-27 LAB — URINALYSIS
BILIRUBIN UA: NEGATIVE
BLOOD UA: NEGATIVE
GLUCOSE UA: NEGATIVE
KETONES UA: NEGATIVE
LEUKOCYTE ESTERASE UA: NEGATIVE
NITRITE UA: NEGATIVE
PH UA: 5 (ref 5.0–9.0)
PROTEIN UA: NEGATIVE
SPECIFIC GRAVITY UA: 1.015 (ref 1.003–1.030)
SQUAMOUS EPITHELIAL: 2 /HPF (ref 0–5)
UROBILINOGEN UA: 0.2

## 2019-05-27 LAB — MAGNESIUM: Magnesium:MCnc:Pt:Ser/Plas:Qn:: 1.5 — ABNORMAL LOW

## 2019-05-27 LAB — PHOSPHORUS: Phosphate:MCnc:Pt:Ser/Plas:Qn:: 2.5 — ABNORMAL LOW

## 2019-05-27 LAB — SPECIFIC GRAVITY UA: Specific gravity:Rden:Pt:Urine:Qn:: 1.015

## 2019-05-27 LAB — PROTEIN URINE: Protein:MCnc:Pt:Urine:Qn:: 20.3

## 2019-05-27 LAB — PROTEIN / CREATININE RATIO, URINE
CREATININE, URINE: 137.4 mg/dL
PROTEIN/CREAT RATIO, URINE: 0.148

## 2019-05-27 LAB — CHOLESTEROL/HDL RATIO SCREEN: Lab: 2.8

## 2019-05-27 LAB — ESTIMATED AVERAGE GLUCOSE: Estimated average glucose:MCnc:Pt:Bld:Qn:Estimated from glycated hemoglobin: 154

## 2019-05-27 LAB — ALKALINE PHOSPHATASE: Alkaline phosphatase:CCnc:Pt:Ser/Plas:Qn:: 96

## 2019-05-27 LAB — LIPASE: Triacylglycerol lipase:CCnc:Pt:Ser/Plas:Qn:: 63

## 2019-05-27 LAB — CALCIUM: Calcium:MCnc:Pt:Ser/Plas:Qn:: 9.2

## 2019-05-27 LAB — PARATHYROID HOMONE (PTH): CALCIUM: 9.2 mg/dL (ref 8.5–10.2)

## 2019-05-27 LAB — WBC ADJUSTED: Leukocytes:NCnc:Pt:Bld:Qn:: 5.7

## 2019-05-27 LAB — TACROLIMUS, TROUGH: Lab: 4 — ABNORMAL LOW

## 2019-05-27 LAB — AMYLASE: Amylase:CCnc:Pt:Ser/Plas:Qn:: 467 — ABNORMAL HIGH

## 2019-05-27 MED ORDER — MIDODRINE 5 MG TABLET
ORAL_TABLET | Freq: Every day | ORAL | 1 refills | 30 days | Status: CP
Start: 2019-05-27 — End: 2019-06-26
  Filled 2019-05-27: qty 30, 30d supply, fill #0

## 2019-05-27 MED ORDER — PREDNISONE 5 MG TABLET
ORAL_TABLET | Freq: Every day | ORAL | 11 refills | 30.00000 days | Status: CP
Start: 2019-05-27 — End: 2020-05-26

## 2019-05-27 MED ORDER — METOCLOPRAMIDE 10 MG TABLET
ORAL_TABLET | Freq: Two times a day (BID) | ORAL | 0 refills | 30.00000 days | Status: CP
Start: 2019-05-27 — End: 2019-06-26

## 2019-05-27 MED ORDER — PROGRAF 1 MG CAPSULE
ORAL_CAPSULE | 11 refills | 0 days | Status: CP
Start: 2019-05-27 — End: ?

## 2019-05-27 MED FILL — MIDODRINE 5 MG TABLET: 30 days supply | Qty: 30 | Fill #0 | Status: AC

## 2019-05-27 NOTE — Unmapped (Signed)
Patient was seen in clinic today. Reviewed Tac level 4.2; Amylase 467 and lipase 63 with Dr Matilde Haymaker and Mincemoyer.  Advised to increase prograf dose to 7 mg TID and Start prednisone 5 mg daily.

## 2019-05-27 NOTE — Unmapped (Signed)
Addended by: Seward Speck on: 05/27/2019 04:04 PM     Modules accepted: Orders

## 2019-05-27 NOTE — Unmapped (Signed)
[facility] HOSPITALS TRANSPLANT CLINIC PHARMACY NOTE  05/27/2019   Elaine Koch  [id number]    Medication changes today:   1. Start midodrine 5 mg daily  2. Decrease Reglan to 5 mg BID   3. Increase tacrolimus to 7 mg TID  4. Start prednisone 5 mg daily until pancreatic enzymes WNL and tac level therapeutic    Education/Adherence tools provided today:  1.provided updated medication list  2. provided additional pill box education  3.  provided additional education on immunosuppression and transplant related medications including reviewing indications of medications, dosing and side effects    Follow up items:  1. goal of understanding indications and dosing of immunosuppression medications  2. Consider further taper of metoclopramide at next visit  3. Consider changing Prograf to Envarsus given high tac requirement    Next visit with pharmacy in 1-2 weeks  ____________________________________________________________________    Elaine Koch is a 34 y.o. 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     Other PMH significant for hypertension, sickle cell trait positive, retinopathy, preeclampsia     Seen by pharmacy today for: medication management and pill box fill and adherence education; last seen by pharmacy 1 weeks ago     CC:  Patient complains of dizziness attributed to hypotension    There were no vitals filed for this visit.    Allergies   Allergen Reactions   ??? Iodinated Contrast Media Swelling, Rash and Other (See Comments)     Burning, warmth throughout body and tingling.  Throat swelling.  Treated with benadryl and symptoms improved.  Has not had contrast since then (2013 or 2014).   ??? Nickel Rash   ??? Propranolol Swelling   ??? Eye Irrigating Solution [Ophthalmic Irrigation Solution] Other (See Comments)     Contrast dye (name?) used in eyes caused hot feeling in face, reversed with Benadryl.        Medications reviewed in EPIC medication station and updated today by the clinical pharmacist practitioner.    Outpatient Encounter Medications as of 05/27/2019   Medication Sig Dispense Refill   ??? acetaminophen (TYLENOL) 500 MG tablet Take 1-2 tablets (500-1,000 mg total) by mouth every six (6) hours as needed for pain or fever greater than 100.89F (38C). 100 tablet 0   ??? amLODIPine (NORVASC) 5 MG tablet Take 1 tablet (5 mg total) by mouth daily. HOLD 30 tablet 11   ??? aspirin (ECOTRIN) 81 MG tablet Take 1 tablet (81 mg total) by mouth daily. 30 tablet 11   ??? biotin 5 mg tablet Take 1 tablet (5 mg total) by mouth daily. 30 tablet 11   ??? docusate sodium (COLACE) 100 MG capsule Take 1 capsule (100 mg total) by mouth two (2) times a day as needed for constipation. 30 capsule 2   ??? gabapentin (NEURONTIN) 100 MG capsule Take 1 capsule (100 mg total) by mouth three (3) times a day. 90 capsule 11   ??? magnesium oxide-Mg AA chelate (MAGNESIUM, AMINO ACID CHELATE,) 133 mg Tab Take 1 tablet by mouth two (2) times a day. HOLD until directed to start by your coordinator. 100 tablet 11   ??? metoclopramide (REGLAN) 10 MG tablet Take 1 tablet (10 mg total) by mouth Three (3) times a day before meals. 90 tablet 0   ??? multivitamin, prenatal, folic acid-iron, 27-1 mg Tab Take 1 tablet by mouth daily.     ??? mycophenolate (MYFORTIC) 180 MG EC tablet Take 4 tablets (  720 mg total) by mouth two (2) times a day. Adjust dose per medication card. 180 tablet 11   ??? oxyCODONE (ROXICODONE) 5 MG immediate release tablet Take 1-2 tablets (5-10 mg total) by mouth every six (6) hours as needed for pain. 40 tablet 0   ??? pantoprazole (PROTONIX) 40 MG tablet Take 1 tablet (40 mg total) by mouth daily. 30 tablet 11   ??? sulfamethoxazole-trimethoprim (BACTRIM) 400-80 mg per tablet Take 1 tablet (80 mg of trimethoprim total) by mouth every Monday, Wednesday, and Friday. 12 tablet 5   ??? tacrolimus (PROGRAF) 1 MG capsule Take 9 capsules (9 mg total) by mouth two (2) times a day. 540 capsule 11   ??? valGANciclovir (VALCYTE) 450 mg tablet Take 1 tablet (450 mg total) by mouth daily. 30 tablet 2     No facility-administered encounter medications on file as of 05/27/2019.     Induction agent : alemtuzumab    CURRENT IMMUNOSUPPRESSION: tacrolimus 10 mg BID (increase dose last night from 9 mg BID)  prograf/Envarsus/cyclosporine goal: 8-10   myfortic720  mg PO bid    steroid free     Patient is tolerating immunosuppression well    IMMUNOSUPPRESSION DRUG LEVELS:  Lab Results   Component Value Date    Tacrolimus, Trough 6.4 05/24/2019    Tacrolimus, Trough 5.6 05/20/2019    Tacrolimus, Trough 7.0 05/17/2019     No results found for: CYCLO  No results found for: EVEROLIMUS  No results found for: SIROLIMUS    Prograf level is accurate 12 hour trough    Graft function: stable  DSA: ntd  Zero hour biopsy: no abnormalities  Biopsies to date: ntd  UPC: 0.181 on 05/20/19  WBC/ANC:  wnl    Plan: Will increase tacrolimus to 7 mg TID and start prednisone given elevated pancreatic enzymes in the setting of low tac levels. Per Dr. Matilde Haymaker, once pancreatic enzymes normalize and tac level at goal can consider stopping prednisone.  Consider changing tacrolimus to Envarsus at next visit.  Continue to monitor.    OI Prophylaxis:   CMV Status: D+/ R+, moderate risk . CMV prophylaxis: valganciclovir 450 mg daily x 3 months per protocol.  No results found for: CMVCP  PCP Prophylaxis: bactrim SS 1 tab MWF x 6 months.  Thrush: completed in hospital  Patient is  tolerating infectious prophylaxis well    Plan: Continue per protocol. Continue to monitor.    CV Prophylaxis: asa 81 mg   The ASCVD Risk score Denman George DC Montez Hageman, et al., 2013) failed to calculate.  Statin therapy: Not indicated; currently on no statin  Plan: Continue to monitor     BP: Goal < 140/90. Clinic vitals reported above  Home BP ranges: 80-90/50s in the AM; 90-100 systolic in PM - reports lightheadedness  Current meds include: none- stopped carvedilol last week  Plan: out of goal start midodrine 5 mg daily. Continue to monitor    Anemia of CKD:  H/H:   Lab Results   Component Value Date    HGB 9.4 (L) 05/24/2019     Lab Results   Component Value Date    HCT 29.0 (L) 05/24/2019     Iron panel:  No results found for: IRON, TIBC, FERRITIN  No results found for: LABIRON    Prior ESA use: none post transplant    Plan: stable. Continue to monitor.     DM:   Lab Results   Component Value Date    A1C 7.0 (H)  05/24/2019   . Goal A1c < 7  History of Dm? Yes: T1DM  Currently on: no insulin post pancreas transplant  Home BS log:   Breakfast Lunch  Dinner  HS   Date AC PC [facility] PC [facility] PC    05/18/2019 108  107  102  112   05/19/2019 105  99  118  115   05/20/2019 103  101  100  100   05/21/2019 100  112  117     05/22/2019 116  125  102     05/23/2019 110  100  106  117   05/24/2019 111  119  107  110   Diet:good appetite  Exercise:not yet  Hypoglycemia: no  Plan:  Continue to monitor    Fluid Intake: 60-80oz daily    Electrolytes: wnl  Meds currently on: none  Plan: Continue to monitor     GI/BM: pt reports no diarrhea or constipation  Meds currently on: docusate PRN (not using), Miralax PRN (not using), metoclopramide 10 mg TID AC, MVI  Plan: Decrease metoclopramide to 10mg  TID AC.  Decrease Reglan to 10 mg BID. Continue to monitor    Pain: pt reports mild pain  Meds currently on: APAP PRN (using 3-4 times since DC), oxycodone PRN (using 2-3 times since DC), gabapentin 100 mg TID (not on pre transplant) - using for mild foot neuropathy  Plan: Continue to monitor    Bone health:   Vitamin D Level: pending. Goal > 30.   Last DEXA results:  none available  Current meds include: none  Plan: Vitamin D level  pending. Continue to monitor.     Women's/Men's Health:  Rielyn Krupinski is a 34 y.o. Female of childbearing age. Patient reports no men's/women's health issues.  No longer has an IUD  Plan: Emphasized importance of contraception if/when she is sexually active.  Continue to monitor    Hair thinning  Meds currently on: biotin 5 mg daily  Plan: continue to monitor    Pharmacy preference:  [facility]    Adherence: Patient has average understanding of medications; was able to independently identify names/doses of immunosuppressants and OI meds.  Patient  does fill their own pill box on a regular basis at home.  Patient brought medication card:yes  Pill box:was correct  Plan: provided extensive adherence counseling/intervention    Patient was reviewed with Dr. Norma Fredrickson who was agreement with the stated plan:     During this visit, the following was completed:   BG log data assessment  BP log data assessment  Labs ordered and evaluated  complex treatment plan >1 DS   I spent a total of 30 minutes face to face with the patient delivering clinical care and providing education/counseling.    All questions/concerns were addressed to the patient's satisfaction.  __________________________________________  Cleone Slim, PHARMD, CPP  [employer] TRANSPL[facility] CLINICAL PHARMACIST PRACTITIONER  PAGER [phone]

## 2019-05-27 NOTE — Unmapped (Signed)
Urine was collected and sent to the lab.

## 2019-05-27 NOTE — Unmapped (Signed)
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 She is recovering well.  - Final JP drain pulled today from right abdomen  - Start midodrine 5 daily for hypotension  - Encouraged PO fluid intake  - Continue adherence to all medications including ASA/prograf  - Next follow up scheduled for 4/20, staples can be removed at that time    Subjective  Elaine Koch is a 34 y.o. 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 There were no complications intra-operatively, and she tolerated the procedure well. She presents today for first postoperative visit since discharge. She remains on a post-transplant ppx regimen of prograf and myfortic, as well as valgancyclovir. She reports pain well controlled on mostly tylenol, only requiring oxycodone occasionally during the week. Her pain is mostly at the site of her drains. She has been diligently documenting drain output BID. Drain output has been serous. She reports some dyspnea with exertion and states that her energy level has not quite returned, however her appetite has significantly improved and she is eating three meals daily without nausea or vomiting. She is conscientious about maximizing fluid intake. She does report intermittent hypotension to 80s/40's with associated dizziness and headaches. She reports chronic loose stools, unchanged from preop status, without changes in color. Her blood glucose has been well controlled without medication, max POCs in 120s.    Interval  Since seen in clinic on 4/8, appetite continues to improve. Coreg was stopped and continues to have low BP, 80/50s in the morning. Making good urine output 1.5-2L daily. Blood sugars well controlled. Her drain has been putting out 90-120cc serous fluid daily.      Objective  Vitals:    05/27/19 0922   BP: 105/73   Pulse: 89   Temp: 36.6 ??C   TempSrc: Tympanic   Weight: 62.6 kg (138 lb 1.6 oz)   Height: 165.1 cm (5' 5)      Body mass index is 22.98 kg/m??.     Physical Exam:    General Appearance:   No acute distress  Lungs:                Non-labored breathing on room air  Heart:                           Regular rate and rhythm  Abdomen:                Soft, appropriately-tender, non-distended. Left JP site c/d/i. Right JP drain with serous output, clot in bulb, no surrounding erythema  Extremities:              Warm and well perfused  Incision:    Midline laparotomy stapled, c/d/i, no surrounding erythema      Data Review:  All lab results last 24 hours:    Recent Results (from the past 24 hour(s))   CBC w/ Differential    Collection Time: 05/27/19  8:50 AM   Result Value Ref Range    WBC 5.7 4.5 - 11.0 10*9/L    RBC 3.22 (L) 4.00 - 5.20 10*12/L    HGB 9.6 (L) 12.0 - 16.0 g/dL    HCT 16.1 (L) 09.6 - 46.0 %    MCV 93.9 80.0 - 100.0 fL    MCH 29.9 26.0 - 34.0 pg  MCHC 31.8 31.0 - 37.0 g/dL    RDW 16.1 (H) 09.6 - 15.0 %    MPV 8.1 7.0 - 10.0 fL    Platelet 491 (H) 150 - 440 10*9/L    Neutrophils % 80.6 %    Lymphocytes % 1.6 %    Monocytes % 2.7 %    Eosinophils % 13.7 %    Basophils % 0.6 %    Neutrophil Left Shift 1+ (A) Not Present    Absolute Neutrophils 4.6 2.0 - 7.5 10*9/L    Absolute Lymphocytes 0.1 (L) 1.5 - 5.0 10*9/L    Absolute Monocytes 0.2 0.2 - 0.8 10*9/L    Absolute Eosinophils 0.8 (H) 0.0 - 0.4 10*9/L    Absolute Basophils 0.0 0.0 - 0.1 10*9/L    Large Unstained Cells 1 0 - 4 %    Macrocytosis Slight (A) Not Present    Hypochromasia Moderate (A) Not Present       Imaging: No new    Alison Murray, MD  General Surgery PGY-1

## 2019-05-28 DIAGNOSIS — Z940 Kidney transplant status: Principal | ICD-10-CM

## 2019-05-28 DIAGNOSIS — Z9483 Pancreas transplant status: Principal | ICD-10-CM

## 2019-05-28 LAB — CMV DNA, QUANTITATIVE, PCR: CMV QUANT: <50 [IU]/mL — ABNORMAL HIGH (ref <0)

## 2019-05-28 LAB — CMV QUANT LOG10: Lab: 0

## 2019-05-28 MED ORDER — TACROLIMUS 1 MG CAPSULE, IMMEDIATE-RELEASE
ORAL_CAPSULE | 11 refills | 0 days | Status: CP
Start: 2019-05-28 — End: ?
  Filled 2019-06-01: qty 630, 30d supply, fill #0

## 2019-05-28 NOTE — Unmapped (Signed)
[facility] Specialty Medication Onboarding    Specialty Medication: Tacrolimus  Prior Authorization: Not Required   Financial Assistance: No - copay  <$25  Final Copay/Day Supply: $0.00 / 30    Insurance Restrictions: Yes - max 1 month supply     Notes to Pharmacist: N/A    The triage team has completed the benefits investigation and has determined that the patient is able to fill this medication at [facility]. Please contact the patient to complete the onboarding or follow up with the prescribing physician as needed.

## 2019-05-31 DIAGNOSIS — D849 Immunodeficiency, unspecified: Principal | ICD-10-CM

## 2019-05-31 DIAGNOSIS — Z0000 Encounter for general adult medical examination without abnormal findings: Principal | ICD-10-CM

## 2019-05-31 DIAGNOSIS — Z940 Kidney transplant status: Principal | ICD-10-CM

## 2019-05-31 DIAGNOSIS — Z9483 Pancreas transplant status: Principal | ICD-10-CM

## 2019-05-31 DIAGNOSIS — E1029 Type 1 diabetes mellitus with other diabetic kidney complication: Principal | ICD-10-CM

## 2019-05-31 DIAGNOSIS — Z114 Encounter for screening for human immunodeficiency virus [HIV]: Principal | ICD-10-CM

## 2019-05-31 DIAGNOSIS — Z1159 Encounter for screening for other viral diseases: Principal | ICD-10-CM

## 2019-05-31 DIAGNOSIS — Z79899 Other long term (current) drug therapy: Principal | ICD-10-CM

## 2019-05-31 DIAGNOSIS — E559 Vitamin D deficiency, unspecified: Principal | ICD-10-CM

## 2019-05-31 NOTE — Unmapped (Signed)
[facility] PHARMACY NOTE  06/01/2019   Elaine Koch  [id number]    Medication changes today:   1. Increase valganciclovir to 900 mg daily    Education/Adherence tools provided today:  1.provided updated medication list  2.  provided additional education on immunosuppression and transplant related medications including reviewing indications of medications, dosing and side effects    Follow up items:  1. goal of understanding indications and dosing of immunosuppression medications  2. Consider further taper of metoclopramide at next visit.  3. Consider changing Prograf to Envarsus given high tac requirement when therapeutic  4. CMV level  5. Once tac levels are stable and pancreatic enzymes WNL - consider stopping prednisone  6. Contraception plan    Next visit with pharmacy in 1 month  ____________________________________________________________________    Elaine Koch is a 34 y.o. 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     Other PMH significant for hypertension, sickle cell trait positive, retinopathy, preeclampsia     Seen by pharmacy today for: medication management and pill box fill and adherence education; last seen by pharmacy 1 weeks ago     CC:  Patient complains of headaches in AM, sometimes improves with breakfast    Vitals:    06/01/19 0828   BP: 111/72   Pulse: 89   Temp: 36.3 ??C (97.3 ??F)   SpO2: 100%       Allergies   Allergen Reactions   ??? Iodinated Contrast Media Swelling, Rash and Other (See Comments)     Burning, warmth throughout body and tingling.  Throat swelling.  Treated with benadryl and symptoms improved.  Has not had contrast since then (2013 or 2014).   ??? Nickel Rash   ??? Propranolol Swelling   ??? Eye Irrigating Solution [Ophthalmic Irrigation Solution] Other (See Comments)     Contrast dye (name?) used in eyes caused hot feeling in face, reversed with Benadryl.        Medications reviewed in EPIC medication station and updated today by the clinical pharmacist practitioner.    Outpatient Encounter Medications as of 06/01/2019   Medication Sig Dispense Refill   ??? acetaminophen (TYLENOL) 500 MG tablet Take 1-2 tablets (500-1,000 mg total) by mouth every six (6) hours as needed for pain or fever greater than 100.80F (38C). 100 tablet 0   ??? aspirin (ECOTRIN) 81 MG tablet Take 1 tablet (81 mg total) by mouth daily. 30 tablet 11   ??? biotin 5 mg tablet Take 1 tablet (5 mg total) by mouth daily. 30 tablet 11   ??? docusate sodium (COLACE) 100 MG capsule Take 1 capsule (100 mg total) by mouth two (2) times a day as needed for constipation. 30 capsule 2   ??? gabapentin (NEURONTIN) 100 MG capsule Take 1 capsule (100 mg total) by mouth three (3) times a day. 90 capsule 11   ??? magnesium oxide-Mg AA chelate (MAGNESIUM, AMINO ACID CHELATE,) 133 mg Tab Take 1 tablet by mouth two (2) times a day. HOLD until directed to start by your coordinator. 100 tablet 11   ??? metoclopramide (REGLAN) 10 MG tablet Take 1 tablet (10 mg total) by mouth Two (2) times a day (30 minutes before a meal). 60 tablet 0   ??? midodrine (PROAMATINE) 5 MG tablet Take 1 tablet (5 mg total) by mouth daily. 30 tablet 1   ??? multivitamin, prenatal, folic acid-iron, 27-1 mg Tab Take 1 tablet by mouth daily.     ???  mycophenolate (MYFORTIC) 180 MG EC tablet Take 4 tablets (720 mg total) by mouth two (2) times a day. Adjust dose per medication card. 180 tablet 11   ??? pantoprazole (PROTONIX) 40 MG tablet Take 1 tablet (40 mg total) by mouth daily. 30 tablet 11   ??? predniSONE (DELTASONE) 5 MG tablet Take 1 tablet (5 mg total) by mouth daily. 30 tablet 11   ??? sulfamethoxazole-trimethoprim (BACTRIM) 400-80 mg per tablet Take 1 tablet (80 mg of trimethoprim total) by mouth every Monday, Wednesday, and Friday. 12 tablet 5   ??? tacrolimus (PROGRAF) 1 MG capsule Take 7 capsules (7 mg) by mouth three (3) times a day 630 capsule 11   ??? valGANciclovir (VALCYTE) 450 mg tablet Take 2 tablets (900 mg total) by mouth daily. 60 tablet 1   ??? [DISCONTINUED] valGANciclovir (VALCYTE) 450 mg tablet Take 1 tablet (450 mg total) by mouth daily. 30 tablet 2     No facility-administered encounter medications on file as of 06/01/2019.     Induction agent : alemtuzumab    CURRENT IMMUNOSUPPRESSION: tacrolimus 7 mg TID (recent increase from 10 mg BID)  prograf/Envarsus/cyclosporine goal: 8-10   myfortic720 mg PO bid per protocol   prednisone 5mg  daily (started 05/27/19 given elevated pancreatic enzymes in setting of low tac levels)    Patient is tolerating immunosuppression well. Some headache noted, but does not seem to be directly related to tacrolimus dose.    IMMUNOSUPPRESSION DRUG LEVELS:  Lab Results   Component Value Date    Tacrolimus, Trough 4.0 (L) 05/27/2019    Tacrolimus, Trough 6.4 05/24/2019    Tacrolimus, Trough 5.6 05/20/2019     No results found for: CYCLO  No results found for: EVEROLIMUS  No results found for: SIROLIMUS    Prograf level is accurate 12 hour trough    Graft function: improving (amylase slightly elevated but improving from 4/15)  DSA: ntd  Zero hour biopsy: no abnormalities  Biopsies to date: ntd  UPC: 0.133 on 06/01/19  WBC/ANC:  wnl    Plan: Will maintain current immunosuppression. Per Dr. Matilde Haymaker, once pancreatic enzymes normalize and tac level at goal can consider stopping prednisone. Consider changing to Envarsus once on a therapeutic dose. Continue to monitor.    OI Prophylaxis:   CMV Status: D+/ R+, moderate risk . CMV prophylaxis: valganciclovir 450 mg daily x 3 months per protocol.  Lab Results   Component Value Date    CMV Quant <50 (H) 05/27/2019   Estimated Creatinine Clearance: 58.7 mL/min (A) (based on SCr of 1.25 mg/dL (H)).  PCP Prophylaxis: bactrim SS 1 tab MWF x 6 months.  Thrush: completed in hospital  Patient is  tolerating infectious prophylaxis well    Plan: Increase Valcyte to 900mg  PO daily given detectable CMV.  Repeat CMV today. Continue to monitor.    CV Prophylaxis: asa 81 mg daily  The ASCVD Risk score Denman George DC Montez Hageman, et al., 2013) failed to calculate.  Statin therapy: Not indicated; currently on no statin  Plan: Continue to monitor     BP: Goal < 140/90. Clinic vitals reported above.  Home BP ranges: 80-96/60s, but asymptomatic which is improved from last week  Current meds include: midodrine 5mg  PO daily (Coreg stopped on 05/20/19)  Plan: Continue to monitor.     Anemia of CKD:  H/H:   Lab Results   Component Value Date    HGB 9.6 (L) 05/27/2019     Lab Results   Component Value Date  HCT 30.3 (L) 05/27/2019     Iron panel:  No results found for: IRON, TIBC, FERRITIN  No results found for: LABIRON    Prior ESA use: none post transplant    Plan: stable. Continue to monitor.     DM:   Lab Results   Component Value Date    A1C 7.0 (H) 05/27/2019   . Goal A1c < 7  History of Dm? Yes: T1DM  Currently on: no insulin post pancreas transplant  Home BS log: did not bring  Diet: good appetite  Exercise: not yet  Hypoglycemia: no  Plan:  Continue to monitor    Fluid Intake: 60-84oz daily  Plan: Continue to monitor     Electrolytes: wnl  Meds currently on: none  Plan: Continue to monitor     GI/BM: pt reports no diarrhea or constipation. No c/o nausea, but patient says it takes a while for bowel movements to complete  Meds currently on: docusate PRN (not using), Miralax PRN (not using), metoclopramide 10 mg BID AC, MVI daily  Plan: Continue current regimen. Continue to monitor    Pain: pt reports mild pain  Meds currently on: APAP PRN (uses occasionally for headache), gabapentin 100 mg TID (not on pre transplant - for mild foot neuropathy)  Plan: Continue to monitor    Bone health:   Vitamin D Level: pending. Goal > 30.   Last DEXA results:  none available  Current meds include: none  Plan: Vitamin D level  pending.  Continue to monitor.     Women's/Men's Health:  Elaine Koch is a 34 y.o. Female of childbearing age. Patient reports no men's/women's health issues. No longer has an IUD.  Plan: Follow up w/contraception plan at next visit. Continue to monitor.    Hair thinning:  Meds currently on: biotin 5 mg daily  Plan: continue to monitor    Immunizations: first vaccine received pior to transplant, will wait for second dose in 3 months  Immunization History   Administered Date(s) Administered   ??? COVID-19 VACC,MRNA,(PFIZER)(PF)(IM) 04/29/2019   ??? HPV Quadrivalent (Gardasil) 05/28/2012   ??? Hepatitis B, Adult 12/10/2017   ??? Influenza Vaccine Quad (IIV4 PF) 75mo+ injectable 10/17/2015, 01/29/2019   ??? Influenza Virus Vaccine, unspecified formulation 11/12/2016, 01/31/2019   ??? PNEUMOCOCCAL POLYSACCHARIDE 23 12/20/2016   ??? TdaP 10/17/2015     Pharmacy preference:  [facility]    Adherence: Patient has average understanding of medications; was able to identify names/doses of immunosuppressants and OI meds with help from her mother.  Patient  does fill their own pill box on a regular basis at home.  Patient brought medication card:yes  Pill box:was correct  Plan: provided moderate adherence counseling/intervention    Patient was reviewed with Dr. True/Dr. Lovena Neighbours who was agreement with the stated plan:     During this visit, the following was completed:   BG log data assessment  BP log data assessment  Labs ordered and evaluated  complex treatment plan >1 DS   I spent a total of 20 minutes face to face with the patient delivering clinical care and providing education/counseling.    All questions/concerns were addressed to the patient's satisfaction.    Oneida Arenas, PharmD Candidate  __________________________________________  Cleone Slim, PHARMD, CPP  [employer] CLINICAL PHARMACIST PRACTITIONER  PAGER [phone]

## 2019-06-01 ENCOUNTER — Ambulatory Visit: Admit: 2019-06-01 | Discharge: 2019-06-01 | Payer: MEDICARE

## 2019-06-01 DIAGNOSIS — Z9483 Pancreas transplant status: Secondary | ICD-10-CM

## 2019-06-01 DIAGNOSIS — Z940 Kidney transplant status: Principal | ICD-10-CM

## 2019-06-01 DIAGNOSIS — Z79899 Other long term (current) drug therapy: Principal | ICD-10-CM

## 2019-06-01 DIAGNOSIS — I959 Hypotension, unspecified: Principal | ICD-10-CM

## 2019-06-01 DIAGNOSIS — D638 Anemia in other chronic diseases classified elsewhere: Principal | ICD-10-CM

## 2019-06-01 LAB — CBC W/ AUTO DIFF
BASOPHILS ABSOLUTE COUNT: 0 10*9/L (ref 0.0–0.1)
BASOPHILS RELATIVE PERCENT: 0.9 %
EOSINOPHILS RELATIVE PERCENT: 8.8 %
HEMATOCRIT: 31.3 % — ABNORMAL LOW (ref 36.0–46.0)
HEMOGLOBIN: 9.8 g/dL — ABNORMAL LOW (ref 12.0–16.0)
LARGE UNSTAINED CELLS: 1 % (ref 0–4)
LYMPHOCYTES ABSOLUTE COUNT: 0.1 10*9/L — ABNORMAL LOW (ref 1.5–5.0)
LYMPHOCYTES RELATIVE PERCENT: 2 %
MEAN CORPUSCULAR HEMOGLOBIN CONC: 31.4 g/dL (ref 31.0–37.0)
MEAN CORPUSCULAR HEMOGLOBIN: 29.7 pg (ref 26.0–34.0)
MEAN CORPUSCULAR VOLUME: 94.6 fL (ref 80.0–100.0)
MEAN PLATELET VOLUME: 7.6 fL (ref 7.0–10.0)
MONOCYTES ABSOLUTE COUNT: 0.2 10*9/L (ref 0.2–0.8)
MONOCYTES RELATIVE PERCENT: 3.6 %
PLATELET COUNT: 622 10*9/L — ABNORMAL HIGH (ref 150–440)
RED BLOOD CELL COUNT: 3.31 10*12/L — ABNORMAL LOW (ref 4.00–5.20)
RED CELL DISTRIBUTION WIDTH: 16.1 % — ABNORMAL HIGH (ref 12.0–15.0)
WBC ADJUSTED: 4.5 10*9/L (ref 4.5–11.0)

## 2019-06-01 LAB — COMPREHENSIVE METABOLIC PANEL
ALBUMIN: 3.5 g/dL (ref 3.5–5.0)
ALKALINE PHOSPHATASE: 104 U/L (ref 38–126)
ALT (SGPT): 89 U/L — ABNORMAL HIGH (ref <35)
ANION GAP: 6 mmol/L — ABNORMAL LOW (ref 7–15)
AST (SGOT): 49 U/L — ABNORMAL HIGH (ref 14–38)
BILIRUBIN TOTAL: 0.4 mg/dL (ref 0.0–1.2)
BUN / CREAT RATIO: 17
CALCIUM: 9.5 mg/dL (ref 8.5–10.2)
CHLORIDE: 111 mmol/L — ABNORMAL HIGH (ref 98–107)
CO2: 24 mmol/L (ref 22.0–30.0)
CREATININE: 1.21 mg/dL — ABNORMAL HIGH (ref 0.60–1.00)
EGFR CKD-EPI AA FEMALE: 69 mL/min/{1.73_m2} (ref >=60)
EGFR CKD-EPI NON-AA FEMALE: 60 mL/min/{1.73_m2} (ref >=60)
GLUCOSE RANDOM: 76 mg/dL (ref 70–99)
POTASSIUM: 4.6 mmol/L (ref 3.5–5.0)
PROTEIN TOTAL: 6.8 g/dL (ref 6.5–8.3)
SODIUM: 141 mmol/L (ref 135–145)

## 2019-06-01 LAB — URINALYSIS
BACTERIA: NONE SEEN /HPF
BILIRUBIN UA: NEGATIVE
BLOOD UA: NEGATIVE
GLUCOSE UA: NEGATIVE
NITRITE UA: NEGATIVE
PH UA: 5 (ref 5.0–9.0)
PROTEIN UA: NEGATIVE
SPECIFIC GRAVITY UA: 1.012 (ref 1.003–1.030)
SQUAMOUS EPITHELIAL: 3 /HPF (ref 0–5)
UROBILINOGEN UA: 0.2
WBC UA: 4 /HPF (ref 0–5)

## 2019-06-01 LAB — AMYLASE: Amylase:CCnc:Pt:Ser/Plas:Qn:: 179 — ABNORMAL HIGH

## 2019-06-01 LAB — CREATININE, URINE: Lab: 68.4

## 2019-06-01 LAB — ALT (SGPT): Alanine aminotransferase:CCnc:Pt:Ser/Plas:Qn:: 89 — ABNORMAL HIGH

## 2019-06-01 LAB — LIPASE: Triacylglycerol lipase:CCnc:Pt:Ser/Plas:Qn:: 109

## 2019-06-01 LAB — PROTEIN / CREATININE RATIO, URINE: PROTEIN/CREAT RATIO, URINE: 0.133

## 2019-06-01 LAB — SLIDE REVIEW

## 2019-06-01 LAB — HIV RNA, QUANTITATIVE, PCR: HIV RNA QNT RSLT: NOT DETECTED

## 2019-06-01 LAB — PHOSPHORUS
PHOSPHORUS: 2.9 mg/dL (ref 2.9–4.7)
Phosphate:MCnc:Pt:Ser/Plas:Qn:: 2.9

## 2019-06-01 LAB — HIV RNA QNT RSLT: HIV 1 RNA:PrThr:Pt:Ser/Plas:Ord:Probe.amp.tar: NOT DETECTED

## 2019-06-01 LAB — TACROLIMUS BLOOD: Lab: 7.3

## 2019-06-01 LAB — NITRITE UA: Nitrite:PrThr:Pt:Urine:Ord:Test strip: NEGATIVE

## 2019-06-01 LAB — WBC ADJUSTED: Leukocytes:NCnc:Pt:Bld:Qn:: 4.5

## 2019-06-01 LAB — SMEAR REVIEW

## 2019-06-01 LAB — MAGNESIUM: Magnesium:MCnc:Pt:Ser/Plas:Qn:: 1.4 — ABNORMAL LOW

## 2019-06-01 MED ORDER — VALGANCICLOVIR 450 MG TABLET
ORAL_TABLET | Freq: Every day | ORAL | 1 refills | 30.00000 days | Status: CP
Start: 2019-06-01 — End: 2019-08-30
  Filled 2019-06-01: qty 60, 30d supply, fill #0

## 2019-06-01 MED FILL — TACROLIMUS 1 MG CAPSULE, IMMEDIATE-RELEASE: 30 days supply | Qty: 630 | Fill #0 | Status: AC

## 2019-06-01 MED FILL — VALGANCICLOVIR 450 MG TABLET: 30 days supply | Qty: 60 | Fill #0 | Status: AC

## 2019-06-01 MED FILL — MYCOPHENOLATE SODIUM 180 MG TABLET,DELAYED RELEASE: ORAL | 30 days supply | Qty: 180 | Fill #0

## 2019-06-01 MED FILL — MYCOPHENOLATE SODIUM 180 MG TABLET,DELAYED RELEASE: 30 days supply | Qty: 180 | Fill #0 | Status: AC

## 2019-06-01 NOTE — Unmapped (Signed)
Transplant Nephrology Clinic Visit        Subjective/Interval:     Doing well no acute issues mentioned. Only active issues are low blood pressures without orthostatic, headaches in morning which goes away after breakfast not related to tacro, and some numbness in legs. Waking up freq to urinate.  Patient not demonstrating any symptoms of drug toxicity.  As of now patient with no acute issues, no new complaints, no fever chills or sweats. no chest pain palpitations orthopnea or shortness of breath. no lightheaded. no lower extremity edema. no abdominal pain no n/v/d. no myalgias or arthralgias. no dysuria hematuria or difficulty voiding.        Assessment:  Elaine Koch is a 34 y.o. 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 She was initially diagnosed with CKD age 56 and has been on hemodialysis since 2018. She receives iHD on T Th Sat through an RUE AVF with no significant complications. She was oliguric. She does not have history of peripheral vascular disease. Post transplant course has been uneventful and she is here following up with transplant nephrology.        Recommendations/Plan:     Allograft Function: KP 05/03/19 KDPI 34%   Renal function holding relatively stable with electrolytes and acid base balanced.   Amylase lipase lvl has been stable except the last one shows slight bump     Baseline Cr: Not established yet / trending ~ 1.2 as of now   Last Cr: 1.21 Date: 06/01/19   [place]/ BK     neg Date:   05/20/19  DSA     neg Date:   4/8//21  CMV:     <50 Date:   05/27/19  UPC:     0.148 Date:    05/27/19 / stent still in   Amylase:  179 Date: 06/01/19 down from 467 (05/27/19)   Prednisone : 5mg  / was started on 2nd week of April due to bump in amylase     ?? Continue to monitor labs esp amylase lvl   ?? Will transition to envarsus in near future once tacro lvls at goal and stable for a while   ?? Increase valcyte to 900   ?? Asked patient to check BS at night for 3 nights / light snack > further management based on this    Immunosuppression Management [High Risk Medical Decision Making For Drug Therapy Requiring Intensive Monitoring For Toxicity]:     Tacro target: 8-10   Tacro dose: 7mg  TID   Tacro last lvl /date: 4.0 - 05/27/19   Last date adjusted:   MMF/MPA dose: 720  Wbc/anc count: 5.7 / 4.6  Side effects: headache / some numbness > but does nto appear to be directly related to tac     Blood Pressure Management:  Blood pressure running low and symptomatic. BP meds dced, midodrine started a week ago. Still running 80s but asymptomatic     Lipid Management:   Waiting for new lvls     Infectious Prophylaxis and Monitoring:   On valcyte 450 and bactrim   ?? Will go upto 900 valcyte since CMV starting to be positive and need for higher IS     Health Maintenance:  Covid : 1st shot done before TX/ will wait for second shot in 3 months   Flu: 2020 done   Pneumonia shot : completed as per patient  History of Present Illness    Elaine Koch is a 34 y.o. 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 She was initially diagnosed with CKD age 68 and has been on hemodialysis since 2018. She receives iHD on T Th Sat through an RUE AVF with no significant complications. She was oliguric. She does not have history of peripheral vascular disease. Post transplant course has been uneventful and she is here following up with transplant nephrology.       Transplant History:    Organ Received: DDKT // K/P transplant // 34% //   Native Kidney Disease: T1DM   Date of Transplant: 05/03/19   Post-Transplant Course: prompt   Prior Transplants: none  Induction: campath  Date of Ureteral Stent Removal: scheduled for 06/10/19   CMV/EBV Status: CMV & EBV: D+/R+  Rejection Episodes: none  Donor Specific Antibodies: pending  Results of Renal Imaging (pre and post):     USS 05/10/19 No significant change compared with prior study. Overall stable resistive indices in the renal transplant arteries, slightly increased from prior, however within normal limits.    Significant notes post Tx: A blood culture from her donor grew 1/2 gram(+) cocci. ICID was consulted, and she was started on empiric vancomycin on 05/04/19. The species was later identified to be staph epidermidis, which was likely a contaminant of the blood draw, and vancomycin was discontinued (3/25)    Review of Systems    10 point ros were done and negative unless specified in the HPI     Medications  Current Outpatient Medications   Medication Sig Dispense Refill   ??? acetaminophen (TYLENOL) 500 MG tablet Take 1-2 tablets (500-1,000 mg total) by mouth every six (6) hours as needed for pain or fever greater than 100.99F (38C). 100 tablet 0   ??? aspirin (ECOTRIN) 81 MG tablet Take 1 tablet (81 mg total) by mouth daily. 30 tablet 11   ??? biotin 5 mg tablet Take 1 tablet (5 mg total) by mouth daily. 30 tablet 11   ??? docusate sodium (COLACE) 100 MG capsule Take 1 capsule (100 mg total) by mouth two (2) times a day as needed for constipation. 30 capsule 2   ??? gabapentin (NEURONTIN) 100 MG capsule Take 1 capsule (100 mg total) by mouth three (3) times a day. 90 capsule 11   ??? magnesium oxide-Mg AA chelate (MAGNESIUM, AMINO ACID CHELATE,) 133 mg Tab Take 1 tablet by mouth two (2) times a day. HOLD until directed to start by your coordinator. 100 tablet 11   ??? metoclopramide (REGLAN) 10 MG tablet Take 1 tablet (10 mg total) by mouth Two (2) times a day (30 minutes before a meal). 60 tablet 0   ??? midodrine (PROAMATINE) 5 MG tablet Take 1 tablet (5 mg total) by mouth daily. 30 tablet 1   ??? multivitamin, prenatal, folic acid-iron, 27-1 mg Tab Take 1 tablet by mouth daily.     ??? mycophenolate (MYFORTIC) 180 MG EC tablet Take 4 tablets (720 mg total) by mouth two (2) times a day. Adjust dose per medication card. 180 tablet 11   ??? pantoprazole (PROTONIX) 40 MG tablet Take 1 tablet (40 mg total) by mouth daily. 30 tablet 11   ??? predniSONE (DELTASONE) 5 MG tablet Take 1 tablet (5 mg total) by mouth daily. 30 tablet 11   ??? sulfamethoxazole-trimethoprim (BACTRIM) 400-80 mg per tablet Take 1 tablet (80 mg of trimethoprim total) by mouth  every Monday, Wednesday, and Friday. 12 tablet 5   ??? tacrolimus (PROGRAF) 1 MG capsule Take 7 capsules (7 mg) by mouth three (3) times a day 630 capsule 11   ??? valGANciclovir (VALCYTE) 450 mg tablet Take 1 tablet (450 mg total) by mouth daily. 30 tablet 2     No current facility-administered medications for this visit.           Physical Exam    Vitals:    06/01/19 0822   BP: 111/72   Pulse: 89   Temp: 36.3 ??C (97.3 ??F)   SpO2: 100%      Gen: built for age , without no distress,   Eyes: no pallor no icterus   ENT: wearing mask   Neck: wearing mask   CVS: s1 s2 heard, rhythm normal, 2/6 murmur   Resp: air entry b/l no creps   Abd: soft non tender incision clean and healed   Ext: no edema, no muscle wasting Large AVF right arm   Cns:  Aaox3, no tremor,grossly intact   Skin:  No new rash, no lesions       Laboratory Data and Imaging reviewed in EPIC      Counseling:  I counseled the patient on:  The need to avoid sun exposure and the use of sunblock while outdoors given the relatively higher risk of skin malignancy in an immunosuppressed state.  The need for adherence to immunosuppression medication.  Patient verbalized understanding.     Follow-Up:  Return to clinic in 4 weeks  Patient will continue to follow-up with his primary care provider for non-transplant related issues and medication refills. We have ordered transplant specific labs per the center's guidelines to monitor and assess for toxicities from immunosuppressant drug therapy          Nunzio Cobbs MD  Transplant Nephrology  [facility] Division of Nephrology and Hypertension  06/01/2019  8:27 AM

## 2019-06-01 NOTE — Unmapped (Signed)
[facility] Clinical Assessment & Refill Coordination Note    Elaine Koch, [place]: [birth year]  Phone: [phone] (home)     All above HIPAA information was verified with patient.     Was a Nurse, learning disability used for this call? No    Specialty Medication(s):   Transplant:  mycophenolic acid 180mg , tacrolimus 1mg  and valgancyclovir 450mg      Current Outpatient Medications   Medication Sig Dispense Refill   ??? acetaminophen (TYLENOL) 500 MG tablet Take 1-2 tablets (500-1,000 mg total) by mouth every six (6) hours as needed for pain or fever greater than 100.73F (38C). 100 tablet 0   ??? aspirin (ECOTRIN) 81 MG tablet Take 1 tablet (81 mg total) by mouth daily. 30 tablet 11   ??? biotin 5 mg tablet Take 1 tablet (5 mg total) by mouth daily. 30 tablet 11   ??? docusate sodium (COLACE) 100 MG capsule Take 1 capsule (100 mg total) by mouth two (2) times a day as needed for constipation. 30 capsule 2   ??? gabapentin (NEURONTIN) 100 MG capsule Take 1 capsule (100 mg total) by mouth three (3) times a day. 90 capsule 11   ??? magnesium oxide-Mg AA chelate (MAGNESIUM, AMINO ACID CHELATE,) 133 mg Tab Take 1 tablet by mouth two (2) times a day. HOLD until directed to start by your coordinator. 100 tablet 11   ??? metoclopramide (REGLAN) 10 MG tablet Take 1 tablet (10 mg total) by mouth Two (2) times a day (30 minutes before a meal). 60 tablet 0   ??? midodrine (PROAMATINE) 5 MG tablet Take 1 tablet (5 mg total) by mouth daily. 30 tablet 1   ??? multivitamin, prenatal, folic acid-iron, 27-1 mg Tab Take 1 tablet by mouth daily.     ??? mycophenolate (MYFORTIC) 180 MG EC tablet Take 4 tablets (720 mg total) by mouth two (2) times a day. Adjust dose per medication card. 180 tablet 11   ??? pantoprazole (PROTONIX) 40 MG tablet Take 1 tablet (40 mg total) by mouth daily. 30 tablet 11   ??? predniSONE (DELTASONE) 5 MG tablet Take 1 tablet (5 mg total) by mouth daily. 30 tablet 11   ??? sulfamethoxazole-trimethoprim (BACTRIM) 400-80 mg per tablet Take 1 tablet (80 mg of trimethoprim total) by mouth every Monday, Wednesday, and Friday. 12 tablet 5   ??? tacrolimus (PROGRAF) 1 MG capsule Take 7 capsules (7 mg) by mouth three (3) times a day 630 capsule 11   ??? valGANciclovir (VALCYTE) 450 mg tablet Take 2 tablets (900 mg total) by mouth daily. 60 tablet 1     No current facility-administered medications for this visit.        Changes to medications: Elaine Koch reports no changes at this time.    Allergies   Allergen Reactions   ??? Iodinated Contrast Media Swelling, Rash and Other (See Comments)     Burning, warmth throughout body and tingling.  Throat swelling.  Treated with benadryl and symptoms improved.  Has not had contrast since then (2013 or 2014).   ??? Nickel Rash   ??? Propranolol Swelling   ??? Eye Irrigating Solution [Ophthalmic Irrigation Solution] Other (See Comments)     Contrast dye (name?) used in eyes caused hot feeling in face, reversed with Benadryl.    ??? Iodine      Other reaction(s): Skin Rash   ??? Naltrexone Rash   ??? Uni-Cortrom Rash       Changes to allergies: No  SPECIALTY MEDICATION ADHERENCE     Tacrolimus 1 mg: 2 days of medicine on hand   Mycophenolate 180 mg: 2 days of medicine on hand   Valganciclvoir 450 mg: 4 days of medicine on hand     Medication Adherence    Patient reported X missed doses in the last month: 0  Specialty Medication: Valganciclovir 450mg   Patient is on additional specialty medications: Yes  Additional Specialty Medications: Mycophenolate 180mg   Patient Reported Additional Medication X Missed Doses in the Last Month: 0  Patient is on more than two specialty medications: Yes  Specialty Medication: Tacrolimus 1mg   Patient Reported Additional Medication X Missed Doses in the Last Month: 0          Specialty medication(s) dose(s) confirmed: Regimen is correct and unchanged.     Are there any concerns with adherence? No    Adherence counseling provided? Not needed    CLINICAL MANAGEMENT AND INTERVENTION Clinical Benefit Assessment:    Do you feel the medicine is effective or helping your condition? Yes    Clinical Benefit counseling provided? Not needed    Adverse Effects Assessment:    Are you experiencing any side effects? No    Are you experiencing difficulty administering your medicine? No    Quality of Life Assessment:    How many days over the past month did your kidney-pancreas transplant  keep you from your normal activities? For example, brushing your teeth or getting up in the morning. 0    Have you discussed this with your provider? Not needed    Therapy Appropriateness:    Is therapy appropriate? Yes, therapy is appropriate and should be continued    DISEASE/MEDICATION-SPECIFIC INFORMATION      N/A    PATIENT SPECIFIC NEEDS     - Does the patient have any physical, cognitive, or cultural barriers? No    - Is the patient high risk? Yes, patient is taking a REMS drug. Medication is dispensed in compliance with REMS program.     - Does the patient require a Care Management Plan? No     - Does the patient require physician intervention or other additional services (i.e. nutrition, smoking cessation, social work)? No      SHIPPING     Specialty Medication(s) to be Shipped:   Transplant:  mycophenolic acid 180mg , tacrolimus 1mg  and valgancyclovir 450mg     Other medication(s) to be shipped: gabapentin     Changes to insurance: No    Delivery Scheduled: Yes, Expected medication delivery date: 06/02/19.     Medication will be delivered via UPS to the confirmed prescription address in [facility].    The patient will receive a drug information handout for each medication shipped and additional FDA Medication Guides as required.  Verified that patient has previously received a Conservation officer, historic buildings.    All of the patient's questions and concerns have been addressed.    Tera Helper   [facility] Specialty Pharmacist

## 2019-06-02 LAB — HEPATITIS C RNA, QUANTITATIVE, PCR: HCV RNA: NOT DETECTED

## 2019-06-02 LAB — CMV DNA, QUANTITATIVE, PCR: CMV VIRAL LD: NOT DETECTED

## 2019-06-02 LAB — CMV VIRAL LD: Lab: NOT DETECTED

## 2019-06-02 LAB — VITAMIN D, TOTAL (25OH): Lab: 24.8

## 2019-06-02 LAB — BK BLOOD RESULT: Lab: NOT DETECTED

## 2019-06-02 LAB — BK VIRUS QUANTITATIVE PCR, BLOOD

## 2019-06-02 LAB — HCV RNA COMMENT: Lab: 0

## 2019-06-02 MED FILL — GABAPENTIN 100 MG CAPSULE: ORAL | 30 days supply | Qty: 90 | Fill #0

## 2019-06-02 MED FILL — GABAPENTIN 100 MG CAPSULE: 30 days supply | Qty: 90 | Fill #0 | Status: AC

## 2019-06-03 ENCOUNTER — Ambulatory Visit: Payer: Medicare Other | Admitting: Internal Medicine

## 2019-06-03 DIAGNOSIS — Z940 Kidney transplant status: Principal | ICD-10-CM

## 2019-06-03 DIAGNOSIS — Z9483 Pancreas transplant status: Principal | ICD-10-CM

## 2019-06-03 LAB — HBV DNA QUANT (MAYO): Hepatitis B virus DNA:ACnc:Pt:Ser/Plas:Qn:Probe.amp.tar: NOT DETECTED

## 2019-06-04 LAB — DSA PT DONOR ID

## 2019-06-04 LAB — DSA COMMENT

## 2019-06-04 LAB — HLA DS POST TRANSPLANT

## 2019-06-07 DIAGNOSIS — Z940 Kidney transplant status: Principal | ICD-10-CM

## 2019-06-07 DIAGNOSIS — E0929 Drug or chemical induced diabetes mellitus with other diabetic kidney complication: Principal | ICD-10-CM

## 2019-06-07 DIAGNOSIS — Z9483 Pancreas transplant status: Principal | ICD-10-CM

## 2019-06-07 DIAGNOSIS — E1029 Type 1 diabetes mellitus with other diabetic kidney complication: Principal | ICD-10-CM

## 2019-06-07 LAB — CBC W/ DIFFERENTIAL
BASOPHILS ABSOLUTE COUNT: 0.5 10*9/L
BASOPHILS RELATIVE PERCENT: 1 %
EOSINOPHILS ABSOLUTE COUNT: 0.4 10*9/L
EOSINOPHILS RELATIVE PERCENT: 8.1 %
HEMATOCRIT: 29.7 % — ABNORMAL LOW
HEMOGLOBIN: 9.3 g/dL — ABNORMAL LOW
LYMPHOCYTES ABSOLUTE COUNT: 0.6 10*9/L — ABNORMAL LOW
LYMPHOCYTES RELATIVE PERCENT: 1.2 %
MEAN CORPUSCULAR HEMOGLOBIN CONC: 31.3 g/dL — ABNORMAL LOW
MEAN CORPUSCULAR HEMOGLOBIN: 29.1 pg
MEAN CORPUSCULAR VOLUME: 92.8 fL
MEAN PLATELET VOLUME: 9.5 fL
MONOCYTES RELATIVE PERCENT: 5.8 %
NEUTROPHILS ABSOLUTE COUNT: 4 10*9/L
NEUTROPHILS RELATIVE PERCENT: 83.9 %
PLATELET COUNT: 490 10*9/L — ABNORMAL HIGH
RED BLOOD CELL COUNT: 3.2 10*12/L — ABNORMAL LOW
RED CELL DISTRIBUTION WIDTH: 14.5 %
WBC ADJUSTED: 4.8 10*9/L

## 2019-06-07 LAB — CO2: Lab: 18 — ABNORMAL LOW

## 2019-06-07 LAB — BASIC METABOLIC PANEL
BLOOD UREA NITROGEN: 24 mg/dL
CHLORIDE: 109 mmol/L
CO2: 18 mmol/L — ABNORMAL LOW
CREATININE: 1.17 mg/dL — ABNORMAL HIGH
POTASSIUM: 4.6 mmol/L
SODIUM: 140 mmol/L

## 2019-06-07 LAB — AMYLASE: Lab: 150 — ABNORMAL HIGH

## 2019-06-07 LAB — ALBUMIN: Lab: 3.3 — ABNORMAL LOW

## 2019-06-07 LAB — LIPASE: Lab: 29

## 2019-06-07 LAB — NEUTROPHILS RELATIVE PERCENT: Lab: 83.9

## 2019-06-07 LAB — MAGNESIUM: Lab: 1.4 — ABNORMAL LOW

## 2019-06-07 LAB — PHOSPHORUS: Lab: 2.7

## 2019-06-07 MED FILL — SULFAMETHOXAZOLE 400 MG-TRIMETHOPRIM 80 MG TABLET: 28 days supply | Qty: 12 | Fill #0 | Status: AC

## 2019-06-07 MED FILL — ASPIRIN 81 MG TABLET,DELAYED RELEASE: 90 days supply | Qty: 90 | Fill #0 | Status: AC

## 2019-06-07 MED FILL — PANTOPRAZOLE 40 MG TABLET,DELAYED RELEASE: ORAL | 30 days supply | Qty: 30 | Fill #0

## 2019-06-07 MED FILL — PANTOPRAZOLE 40 MG TABLET,DELAYED RELEASE: 30 days supply | Qty: 30 | Fill #0 | Status: AC

## 2019-06-07 MED FILL — ASPIRIN 81 MG TABLET,DELAYED RELEASE: ORAL | 90 days supply | Qty: 90 | Fill #0

## 2019-06-08 DIAGNOSIS — Z940 Kidney transplant status: Principal | ICD-10-CM

## 2019-06-08 DIAGNOSIS — Z9483 Pancreas transplant status: Principal | ICD-10-CM

## 2019-06-08 LAB — CBC W/ DIFFERENTIAL
BASOPHILS ABSOLUTE COUNT: 0.4 10*9/L
BASOPHILS RELATIVE PERCENT: 0.7 %
EOSINOPHILS ABSOLUTE COUNT: 0.3 10*9/L
EOSINOPHILS RELATIVE PERCENT: 5.5 %
HEMOGLOBIN: 9.7 g/dL — ABNORMAL LOW
LYMPHOCYTES ABSOLUTE COUNT: 0.7 10*9/L — ABNORMAL LOW
LYMPHOCYTES RELATIVE PERCENT: 1.3 %
MEAN CORPUSCULAR HEMOGLOBIN CONC: 31.7 g/dL — ABNORMAL LOW
MEAN CORPUSCULAR HEMOGLOBIN: 29.4 pg
MEAN CORPUSCULAR VOLUME: 92.7 fL
MEAN PLATELET VOLUME: 9.5 fL
MONOCYTES ABSOLUTE COUNT: 0.4 10*9/L
MONOCYTES RELATIVE PERCENT: 6.6 %
NEUTROPHILS ABSOLUTE COUNT: 4.7 10*9/L
PLATELET COUNT: 365 10*9/L
RED BLOOD CELL COUNT: 3.3 10*12/L — ABNORMAL LOW
RED CELL DISTRIBUTION WIDTH: 14.5 %
WBC ADJUSTED: 5.5 10*9/L

## 2019-06-08 LAB — BASIC METABOLIC PANEL
BLOOD UREA NITROGEN: 33 mg/dL — ABNORMAL HIGH
CHLORIDE: 109 mmol/L
CO2: 22 mmol/L
CREATININE: 1.29 mg/dL — ABNORMAL HIGH
EGFR CKD-EPI AA FEMALE: 64 mL/min/{1.73_m2}
GLUCOSE RANDOM: 84 mg/dL
SODIUM: 137 mmol/L

## 2019-06-08 LAB — HEMOGLOBIN A1C
HEMOGLOBIN A1C: 6.7 % — ABNORMAL HIGH
Lab: 6.7 — ABNORMAL HIGH

## 2019-06-08 LAB — TACROLIMUS, TROUGH: Lab: 7.3

## 2019-06-08 LAB — LIPASE: Lab: 38

## 2019-06-08 LAB — WHITE BLOOD CELL COUNT: Lab: 5.5

## 2019-06-08 LAB — BLOOD UREA NITROGEN: Lab: 33 — ABNORMAL HIGH

## 2019-06-08 LAB — MAGNESIUM: Lab: 1.5

## 2019-06-08 LAB — AMYLASE: Lab: 165 — ABNORMAL HIGH

## 2019-06-08 LAB — ALBUMIN: Lab: 3.5 — ABNORMAL LOW

## 2019-06-08 LAB — PHOSPHORUS: Lab: 2.8

## 2019-06-09 LAB — TACROLIMUS, TROUGH: Lab: 5.8

## 2019-06-10 ENCOUNTER — Ambulatory Visit: Admit: 2019-06-10 | Discharge: 2019-06-10 | Payer: MEDICARE

## 2019-06-10 DIAGNOSIS — Z9483 Pancreas transplant status: Principal | ICD-10-CM

## 2019-06-10 DIAGNOSIS — Z940 Kidney transplant status: Principal | ICD-10-CM

## 2019-06-10 MED ORDER — TACROLIMUS 1 MG CAPSULE, IMMEDIATE-RELEASE
ORAL_CAPSULE | 11 refills | 0 days | Status: CP
Start: 2019-06-10 — End: ?

## 2019-06-10 NOTE — Unmapped (Signed)
[facility]:  Taking Care of Yourself After Cystoscopy Procedures    *Drink plenty of water for a day or two following your procedure.  Try to have about 8 ounces (one cup) at a time, and do this 6 times or more per day.  (If you have fluid restrictions, please ask the nurse or doctor for advice).    *AVOID alcoholic, carbonated and caffeinated drinks for a day or two, as they may cause uncomfortable symptoms.    *For the first 8 hours after the procedure, your urine may be pink or red in color.  Small clots or a few drops of blood can be a normal side effect of the instruments.  Large amounts of bleeding or difficulty urinating are not normal.  Call your doctor if this happens.    *You may experience some mild discomfort of a burning sensation with urination after having this procedure.  If it does not improve, or if other symptoms appear (fever, chills, or difficulty emptying), call your doctor.    *You may return to normal daily activities such as work, school, driving, exercising and housework.    *If your doctor gave you a prescription, take it as ordered.    *If you need a return appointment, the secretary will make it for you when you check out. To contact the Urology Clinic during business hours, call [phone].    *[facility] Operator can be reached at [phone] if you need to get in contact with your doctor.  After the hours, the operator can page the doctor on call for urgent concerns:    Urology Patients should ask for the Urology resident “on call”.    You can get more immediate assistance at the Emergency Room or Urgent Care if necessary.

## 2019-06-10 NOTE — Unmapped (Signed)
Change in Tacrolimus dosage increase. Co-pay $0.00.

## 2019-06-10 NOTE — Unmapped (Signed)
Storz [id number]

## 2019-06-10 NOTE — Unmapped (Signed)
Pt advised to increase prograf dose to 8 mg TID. Last level 5.8

## 2019-06-10 NOTE — Unmapped (Signed)
Procedures  33yF s/p renal transplant    Cystoscopy, ureteral stent removal  Timeout was performed immediately prior to the procedure.    The patient was prepped and draped in the usual sterile fashion.  Flexible cystoscopy was performed.  The indwelling transplant ureteral stent was visualized, grasped, and removed intact.  The patient tolerated the procedure well and was not given one dose of antibiotic prophylaxis afterwards.    Plan: return prn

## 2019-06-14 DIAGNOSIS — E1029 Type 1 diabetes mellitus with other diabetic kidney complication: Principal | ICD-10-CM

## 2019-06-14 DIAGNOSIS — Z9483 Pancreas transplant status: Principal | ICD-10-CM

## 2019-06-14 DIAGNOSIS — Z940 Kidney transplant status: Principal | ICD-10-CM

## 2019-06-14 LAB — EGFR CKD-EPI NON-AA MALE: Lab: 0

## 2019-06-14 LAB — LIPASE: Lab: 29

## 2019-06-14 LAB — CBC W/ DIFFERENTIAL
BASOPHILS ABSOLUTE COUNT: 0.4 10*9/L
BASOPHILS RELATIVE PERCENT: 0.9 %
EOSINOPHILS ABSOLUTE COUNT: 0.2 10*9/L
EOSINOPHILS RELATIVE PERCENT: 4.6 %
HEMATOCRIT: 29.1 % — ABNORMAL LOW
HEMOGLOBIN: 9.3 g/dL — ABNORMAL LOW
LYMPHOCYTES ABSOLUTE COUNT: 0.6 10*9/L — ABNORMAL LOW
LYMPHOCYTES RELATIVE PERCENT: 1.4 %
MEAN CORPUSCULAR HEMOGLOBIN CONC: 32 g/dL
MEAN CORPUSCULAR HEMOGLOBIN: 29.5 pg
MEAN CORPUSCULAR VOLUME: 92.4 fL
MEAN PLATELET VOLUME: 9.5 fL
MONOCYTES ABSOLUTE COUNT: 0.2 10*9/L
MONOCYTES RELATIVE PERCENT: 5.1 %
NEUTROPHILS RELATIVE PERCENT: 88 %
PLATELET COUNT: 304 10*9/L
RED CELL DISTRIBUTION WIDTH: 14.6 %
WBC ADJUSTED: 4.3 10*9/L

## 2019-06-14 LAB — AMYLASE: Lab: 159 — ABNORMAL HIGH

## 2019-06-14 LAB — BASIC METABOLIC PANEL
BLOOD UREA NITROGEN: 26 mg/dL — ABNORMAL HIGH
CALCIUM: 9.4 mg/dL
CREATININE: 1.3 mg/dL — ABNORMAL HIGH
EGFR CKD-EPI AA FEMALE: 63 mL/min/{1.73_m2}
GLUCOSE RANDOM: 79 mg/dL
POTASSIUM: 4.7 mmol/L
SODIUM: 140 mmol/L

## 2019-06-14 LAB — MAGNESIUM: Lab: 1.6

## 2019-06-14 LAB — ALBUMIN: Lab: 3.6

## 2019-06-14 LAB — ANISOCYTOSIS: Lab: 0

## 2019-06-14 LAB — PHOSPHORUS: Lab: 2.8

## 2019-06-15 DIAGNOSIS — Z940 Kidney transplant status: Principal | ICD-10-CM

## 2019-06-15 DIAGNOSIS — Z9483 Pancreas transplant status: Principal | ICD-10-CM

## 2019-06-15 LAB — PHOSPHORUS: Lab: 3

## 2019-06-15 LAB — CBC W/ DIFFERENTIAL
BASOPHILS ABSOLUTE COUNT: 0.2 10*9/L
BASOPHILS RELATIVE PERCENT: 0.5 %
EOSINOPHILS ABSOLUTE COUNT: 0.2 10*9/L
EOSINOPHILS RELATIVE PERCENT: 4.2 %
HEMATOCRIT: 30.2 % — ABNORMAL LOW
HEMOGLOBIN: 9.6 g/dL — ABNORMAL LOW
LYMPHOCYTES ABSOLUTE COUNT: 0.9 10*9/L — ABNORMAL LOW
MEAN CORPUSCULAR HEMOGLOBIN CONC: 31.8 g/dL — ABNORMAL LOW
MEAN CORPUSCULAR HEMOGLOBIN: 29.4 pg
MEAN CORPUSCULAR VOLUME: 92.4 fL
MEAN PLATELET VOLUME: 9.6 fL
MONOCYTES ABSOLUTE COUNT: 0.1 10*9/L — ABNORMAL LOW
MONOCYTES RELATIVE PERCENT: 3.7 %
NEUTROPHILS ABSOLUTE COUNT: 3.6 10*9/L
NEUTROPHILS RELATIVE PERCENT: 89.4 %
PLATELET COUNT: 258 10*9/L
RED BLOOD CELL COUNT: 3.27 10*12/L — ABNORMAL LOW
RED CELL DISTRIBUTION WIDTH: 15.1 % — ABNORMAL HIGH
WBC ADJUSTED: 4 10*9/L

## 2019-06-15 LAB — BASIC METABOLIC PANEL
CALCIUM: 9.2 mg/dL
CHLORIDE: 113 mmol/L — ABNORMAL HIGH
CO2: 22 mmol/L
CREATININE: 1.1 mg/dL
EGFR CKD-EPI AA FEMALE: 77 mL/min/{1.73_m2}
GLUCOSE RANDOM: 88 mg/dL
POTASSIUM: 4.7 mmol/L
SODIUM: 141 mmol/L

## 2019-06-15 LAB — HEMATOCRIT: Lab: 30.2 — ABNORMAL LOW

## 2019-06-15 LAB — AMYLASE: Lab: 127 — ABNORMAL HIGH

## 2019-06-15 LAB — TACROLIMUS, TROUGH: Lab: 5.2

## 2019-06-15 LAB — ESTIMATED AVERAGE GLUCOSE: Lab: 126

## 2019-06-15 LAB — MAGNESIUM: Lab: 1.7

## 2019-06-15 LAB — LIPASE: Lab: 25

## 2019-06-15 LAB — GLUCOSE RANDOM: Lab: 88

## 2019-06-15 LAB — ALBUMIN: Lab: 3.5 — ABNORMAL LOW

## 2019-06-15 LAB — HEMOGLOBIN A1C: ESTIMATED AVERAGE GLUCOSE: 126 mg/dL

## 2019-06-17 DIAGNOSIS — Z9483 Pancreas transplant status: Principal | ICD-10-CM

## 2019-06-17 DIAGNOSIS — Z940 Kidney transplant status: Principal | ICD-10-CM

## 2019-06-17 LAB — TACROLIMUS, TROUGH: Lab: 6.2

## 2019-06-18 NOTE — Unmapped (Signed)
[facility] Refill Coordination Note    Specialty Medication(s) to be Shipped:   Transplant:  mycophenolic acid 180mg     Other medication(s) to be shipped: none    **PT should have enough till 5/20, but she claims to run out on 06/24/19     Elaine Koch, DOB: Feb 17, 1985  Phone: [phone] (home)       All above HIPAA information was verified with patient.     Was a Nurse, learning disability used for this call? No    Completed refill call assessment today to schedule patient's medication shipment from the [facility] [phone]).       Specialty medication(s) and dose(s) confirmed: Regimen is correct and unchanged.   Changes to medications: Jerriyah reports no changes at this time.  Changes to insurance: No  Questions for the pharmacist: No    Confirmed patient received Welcome Packet with first shipment. The patient will receive a drug information handout for each medication shipped and additional FDA Medication Guides as required.       DISEASE/MEDICATION-SPECIFIC INFORMATION        N/A    SPECIALTY MEDICATION ADHERENCE     Medication Adherence    Patient reported X missed doses in the last month: 0      mycophenolic acid 180mg : 9 days worth of medication on hand.            SHIPPING     Shipping address confirmed in Epic.     Delivery Scheduled: Yes, Expected medication delivery date: 06/24/19.     Medication will be delivered via UPS to the prescription address in Epic WAM.    Elaine Koch   [facility] Specialty Technician

## 2019-06-19 NOTE — Unmapped (Signed)
This covering coordinator called patient to check in. She verbalizes HH nurse came to draw labs Thursday and they get sent to [facility]. Pending results.   She confirmed her dose of 8 mg TID and pending next level if additional changes need to be made.     Pt. Had no complaints at this time.

## 2019-06-21 DIAGNOSIS — Z940 Kidney transplant status: Principal | ICD-10-CM

## 2019-06-21 DIAGNOSIS — Z9483 Pancreas transplant status: Principal | ICD-10-CM

## 2019-06-21 LAB — BASIC METABOLIC PANEL
CALCIUM: 9.4 mg/dL
CHLORIDE: 112 mmol/L — ABNORMAL HIGH
CO2: 24 mmol/L
CREATININE: 1.17 mg/dL — ABNORMAL HIGH
EGFR CKD-EPI AA FEMALE: 72 mL/min/{1.73_m2}
GLUCOSE RANDOM: 84 mg/dL
POTASSIUM: 5.1 mmol/L
SODIUM: 140 mmol/L

## 2019-06-21 LAB — CBC W/ DIFFERENTIAL
BASOPHILS ABSOLUTE COUNT: 0.2 10*9/L
BASOPHILS RELATIVE PERCENT: 0.5 %
EOSINOPHILS ABSOLUTE COUNT: 0.2 10*9/L
EOSINOPHILS RELATIVE PERCENT: 3.6 %
HEMATOCRIT: 30.3 % — ABNORMAL LOW
HEMOGLOBIN: 9.5 g/dL — ABNORMAL LOW
LYMPHOCYTES ABSOLUTE COUNT: 0.6 10*9/L — ABNORMAL LOW
LYMPHOCYTES RELATIVE PERCENT: 1.4 %
MEAN CORPUSCULAR HEMOGLOBIN CONC: 31.4 g/dL — ABNORMAL LOW
MEAN CORPUSCULAR HEMOGLOBIN: 29.4 pg
MEAN CORPUSCULAR VOLUME: 93.8 fL
MEAN PLATELET VOLUME: 9.8 fL
MONOCYTES ABSOLUTE COUNT: 0.2 10*9/L — ABNORMAL LOW
MONOCYTES RELATIVE PERCENT: 4.3 %
NEUTROPHILS ABSOLUTE COUNT: 3.8 10*9/L
NEUTROPHILS RELATIVE PERCENT: 90.2 %
PLATELET COUNT: 245 10*9/L
WBC ADJUSTED: 4.2 10*9/L

## 2019-06-21 LAB — POTASSIUM: Lab: 5.1

## 2019-06-21 LAB — MAGNESIUM: Lab: 1.7

## 2019-06-21 LAB — ALBUMIN: Lab: 3.5 — ABNORMAL LOW

## 2019-06-21 LAB — AMYLASE: Lab: 136 — ABNORMAL HIGH

## 2019-06-21 LAB — TACROLIMUS, TROUGH: Lab: 8.6

## 2019-06-21 LAB — SLIDE SCAN: Lab: 0

## 2019-06-21 LAB — LIPASE: Lab: 32

## 2019-06-21 LAB — PHOSPHORUS: Lab: 3.1

## 2019-06-21 MED FILL — MIDODRINE 5 MG TABLET: ORAL | 30 days supply | Qty: 30 | Fill #0

## 2019-06-21 MED FILL — MIDODRINE 5 MG TABLET: 30 days supply | Qty: 30 | Fill #0 | Status: AC

## 2019-06-22 DIAGNOSIS — Z940 Kidney transplant status: Principal | ICD-10-CM

## 2019-06-22 DIAGNOSIS — Z9483 Pancreas transplant status: Principal | ICD-10-CM

## 2019-06-22 LAB — PHOSPHORUS: Lab: 3.3

## 2019-06-22 LAB — CBC W/ DIFFERENTIAL
BASOPHILS ABSOLUTE COUNT: 0.3 10*9/L
BASOPHILS RELATIVE PERCENT: 0.7 %
EOSINOPHILS ABSOLUTE COUNT: 0.1 10*9/L
EOSINOPHILS RELATIVE PERCENT: 2.9 %
HEMATOCRIT: 30.9 % — ABNORMAL LOW
HEMOGLOBIN: 9.8 g/dL — ABNORMAL LOW
LYMPHOCYTES ABSOLUTE COUNT: 0.1 10*9/L — ABNORMAL LOW
LYMPHOCYTES RELATIVE PERCENT: 2.7 %
MEAN CORPUSCULAR HEMOGLOBIN CONC: 31.7 g/dL — ABNORMAL LOW
MEAN CORPUSCULAR HEMOGLOBIN: 29.6 pg
MEAN CORPUSCULAR VOLUME: 93.4 fL
MEAN PLATELET VOLUME: 9.8 fL
MONOCYTES ABSOLUTE COUNT: 0.2 10*9/L
MONOCYTES RELATIVE PERCENT: 4.9 %
NEUTROPHILS ABSOLUTE COUNT: 3.6 10*9/L
NEUTROPHILS RELATIVE PERCENT: 88.8 %
PLATELET COUNT: 255 10*9/L
RED CELL DISTRIBUTION WIDTH: 15.1 % — ABNORMAL HIGH
WHITE BLOOD CELL COUNT: 4.1 10*9/L

## 2019-06-22 LAB — BASIC METABOLIC PANEL
BLOOD UREA NITROGEN: 22 mg/dL
CALCIUM: 9.1 mg/dL
CHLORIDE: 112 mmol/L — ABNORMAL HIGH
CO2: 24 mmol/L
EGFR CKD-EPI AA FEMALE: 69 mL/min/{1.73_m2}
GLUCOSE RANDOM: 84 mg/dL
SODIUM: 142 mmol/L

## 2019-06-22 LAB — LIPASE: Lab: 26

## 2019-06-22 LAB — ANISOCYTOSIS: Lab: 0

## 2019-06-22 LAB — MAGNESIUM: Lab: 1.6

## 2019-06-22 LAB — ALBUMIN
ALBUMIN: 3.5 g/dL — ABNORMAL LOW
Lab: 3.5 — ABNORMAL LOW

## 2019-06-22 LAB — AMYLASE: Lab: 119 — ABNORMAL HIGH

## 2019-06-22 LAB — SODIUM: Lab: 142

## 2019-06-22 LAB — ESTIMATED AVERAGE GLUCOSE: Lab: 0

## 2019-06-22 NOTE — Unmapped (Signed)
This covering coordinator called patient to check in, and she verbalizes she missed her Prograf dose 5/10 at night. Provider aware.     She confirmed she gets her labs drawn prior to 9 am before her meds, despite the noted time of 5/6 labs at 1420     Pt. Confirmed 8 mg TID.     No other needs at this time.     Drue Flirt, RN Transplant Nurse Coordinator 06/22/2019 12:22 PM

## 2019-06-23 LAB — TACROLIMUS, TROUGH: Lab: 7.4

## 2019-06-23 MED FILL — MYCOPHENOLATE SODIUM 180 MG TABLET,DELAYED RELEASE: 30 days supply | Qty: 180 | Fill #1 | Status: AC

## 2019-06-23 MED FILL — MYCOPHENOLATE SODIUM 180 MG TABLET,DELAYED RELEASE: ORAL | 30 days supply | Qty: 180 | Fill #1

## 2019-06-24 DIAGNOSIS — Z9483 Pancreas transplant status: Principal | ICD-10-CM

## 2019-06-24 DIAGNOSIS — Z940 Kidney transplant status: Principal | ICD-10-CM

## 2019-06-24 NOTE — Unmapped (Signed)
After reviewing recent tacrolimus troughs with Wallace Cullens Mincemoyer, will continue to monitor.     Drue Flirt, RN Transplant Nurse Coordinator 06/24/2019 11:22 AM

## 2019-06-28 DIAGNOSIS — Z940 Kidney transplant status: Principal | ICD-10-CM

## 2019-06-28 DIAGNOSIS — Z9483 Pancreas transplant status: Principal | ICD-10-CM

## 2019-06-28 LAB — CBC W/ DIFFERENTIAL
BASOPHILS ABSOLUTE COUNT: 0.2 10*9/L
BASOPHILS RELATIVE PERCENT: 0.6 %
EOSINOPHILS ABSOLUTE COUNT: 0.1 10*9/L
EOSINOPHILS RELATIVE PERCENT: 3.4 %
HEMATOCRIT: 31.2 % — ABNORMAL LOW
LYMPHOCYTES ABSOLUTE COUNT: 0.9 10*9/L — ABNORMAL LOW
LYMPHOCYTES RELATIVE PERCENT: 2.5 %
MEAN CORPUSCULAR HEMOGLOBIN CONC: 32.1 g/dL
MEAN PLATELET VOLUME: 9.7 fL
MONOCYTES ABSOLUTE COUNT: 0.1 10*9/L — ABNORMAL LOW
MONOCYTES RELATIVE PERCENT: 3.1 %
NEUTROPHILS ABSOLUTE COUNT: 3.3 10*9/L
NEUTROPHILS RELATIVE PERCENT: 90.4 %
PLATELET COUNT: 278 10*9/L
RED BLOOD CELL COUNT: 3.36 10*12/L — ABNORMAL LOW
RED CELL DISTRIBUTION WIDTH: 15.4 % — ABNORMAL HIGH
WBC ADJUSTED: 3.6 10*9/L — ABNORMAL LOW

## 2019-06-28 LAB — BASIC METABOLIC PANEL
BLOOD UREA NITROGEN: 25 mg/dL
CALCIUM: 9.2 mg/dL
CO2: 22 mmol/L
CREATININE: 1.11 mg/dL — ABNORMAL HIGH
EGFR CKD-EPI AA FEMALE: 77 mL/min/{1.73_m2}
POTASSIUM: 4.9 mmol/L

## 2019-06-28 LAB — PHOSPHORUS
Lab: 2.9
PHOSPHORUS: 2.9 mg/dL

## 2019-06-28 LAB — ALBUMIN: Lab: 3.9

## 2019-06-28 LAB — MAGNESIUM: Lab: 1.6

## 2019-06-28 LAB — LIPASE
LIPASE: 24 U/L
Lab: 24

## 2019-06-28 LAB — AMYLASE: Lab: 129 — ABNORMAL HIGH

## 2019-06-28 LAB — POTASSIUM: Lab: 4.9

## 2019-06-28 LAB — RED CELL DISTRIBUTION WIDTH: Lab: 15.4 — ABNORMAL HIGH

## 2019-06-29 DIAGNOSIS — Z940 Kidney transplant status: Principal | ICD-10-CM

## 2019-06-29 DIAGNOSIS — Z9483 Pancreas transplant status: Principal | ICD-10-CM

## 2019-06-29 LAB — TACROLIMUS, TROUGH: Lab: 7.3

## 2019-06-29 MED ORDER — TACROLIMUS 1 MG CAPSULE, IMMEDIATE-RELEASE
ORAL_CAPSULE | 11 refills | 0 days
Start: 2019-06-29 — End: ?

## 2019-06-29 NOTE — Unmapped (Signed)
Pt advised to increase dose to 9 mg TID

## 2019-06-30 DIAGNOSIS — Z9483 Pancreas transplant status: Principal | ICD-10-CM

## 2019-06-30 DIAGNOSIS — Z940 Kidney transplant status: Principal | ICD-10-CM

## 2019-06-30 LAB — CHLORIDE: Chloride:SCnc:Pt:Ser/Plas:Qn:: 113 — ABNORMAL HIGH

## 2019-06-30 LAB — CBC W/ DIFFERENTIAL
BANDED NEUTROPHILS ABSOLUTE COUNT: 0 10*3/uL (ref 0.0–0.1)
BASOPHILS ABSOLUTE COUNT: 0 10*3/uL (ref 0.0–0.2)
BASOPHILS RELATIVE PERCENT: 1 %
EOSINOPHILS ABSOLUTE COUNT: 0.1 10*3/uL (ref 0.0–0.4)
EOSINOPHILS RELATIVE PERCENT: 2 %
HEMOGLOBIN: 10.1 g/dL — ABNORMAL LOW (ref 11.1–15.9)
IMMATURE GRANULOCYTES: 1 %
LYMPHOCYTES ABSOLUTE COUNT: 0.1 10*3/uL — ABNORMAL LOW (ref 0.7–3.1)
LYMPHOCYTES RELATIVE PERCENT: 3 %
MEAN CORPUSCULAR HEMOGLOBIN CONC: 32.4 g/dL (ref 31.5–35.7)
MEAN CORPUSCULAR HEMOGLOBIN: 29.7 pg (ref 26.6–33.0)
MONOCYTES RELATIVE PERCENT: 5 %
NEUTROPHILS ABSOLUTE COUNT: 3 10*3/uL (ref 1.4–7.0)
NEUTROPHILS RELATIVE PERCENT: 88 %
PLATELET COUNT: 294 10*3/uL (ref 150–450)
RED BLOOD CELL COUNT: 3.4 x10E6/uL — ABNORMAL LOW (ref 3.77–5.28)
RED CELL DISTRIBUTION WIDTH: 15.6 % — ABNORMAL HIGH (ref 11.7–15.4)
WHITE BLOOD CELL COUNT: 3.4 10*3/uL (ref 3.4–10.8)

## 2019-06-30 LAB — RENAL FUNCTION PANEL
ALBUMIN: 4 g/dL (ref 3.8–4.8)
BLOOD UREA NITROGEN: 22 mg/dL — ABNORMAL HIGH (ref 6–20)
BUN / CREAT RATIO: 17 (ref 9–23)
CALCIUM: 9.3 mg/dL (ref 8.7–10.2)
CHLORIDE: 113 mmol/L — ABNORMAL HIGH (ref 96–106)
CO2: 18 mmol/L — ABNORMAL LOW (ref 20–29)
GFR MDRD NON AF AMER: 54 mL/min/{1.73_m2} — ABNORMAL LOW
GLUCOSE: 84 mg/dL (ref 65–99)
PHOSPHORUS, SERUM: 3 mg/dL (ref 3.0–4.3)
POTASSIUM: 4.8 mmol/L (ref 3.5–5.2)
SODIUM: 143 mmol/L (ref 134–144)

## 2019-06-30 LAB — IMMATURE GRANULOCYTES: Granulocytes.immature/100 leukocytes:NFr:Pt:Bld:Qn:Automated count: 1

## 2019-06-30 LAB — MAGNESIUM: Magnesium:MCnc:Pt:Ser/Plas:Qn:: 1.5 — ABNORMAL LOW

## 2019-06-30 LAB — TACROLIMUS BLOOD: Tacrolimus:MCnc:Pt:Bld:Qn:LC/MS/MS: 9.3

## 2019-06-30 LAB — LIPASE: Triacylglycerol lipase:CCnc:Pt:Ser/Plas:Qn:: 30

## 2019-06-30 LAB — AMYLASE: Amylase:CCnc:Pt:Ser/Plas:Qn:: 172 — ABNORMAL HIGH

## 2019-06-30 MED ORDER — TACROLIMUS 1 MG CAPSULE, IMMEDIATE-RELEASE
ORAL_CAPSULE | 11 refills | 0 days | Status: CP
Start: 2019-06-30 — End: ?
  Filled 2019-06-30: qty 810, 30d supply, fill #0

## 2019-06-30 MED FILL — TACROLIMUS 1 MG CAPSULE, IMMEDIATE-RELEASE: 30 days supply | Qty: 810 | Fill #0 | Status: AC

## 2019-06-30 MED FILL — VALGANCICLOVIR 450 MG TABLET: ORAL | 30 days supply | Qty: 60 | Fill #1

## 2019-06-30 MED FILL — SULFAMETHOXAZOLE 400 MG-TRIMETHOPRIM 80 MG TABLET: ORAL | 28 days supply | Qty: 12 | Fill #1

## 2019-06-30 MED FILL — VALGANCICLOVIR 450 MG TABLET: 30 days supply | Qty: 60 | Fill #1 | Status: AC

## 2019-06-30 MED FILL — SULFAMETHOXAZOLE 400 MG-TRIMETHOPRIM 80 MG TABLET: 28 days supply | Qty: 12 | Fill #1 | Status: AC

## 2019-06-30 NOTE — Unmapped (Signed)
[facility] Refill Coordination Note    Specialty Medication(s) to be Shipped:   Transplant: tacrolimus 1mg  and valgancyclovir 450mg     Other medication(s) to be shipped: bactrim 400-80mg      Elaine Koch, DOB: 10/24/1985  Phone: [phone] (home)       All above HIPAA information was verified with patient.     Was a Nurse, learning disability used for this call? No    Completed refill call assessment today to schedule patient's medication shipment from the [facility] [phone]).       Specialty medication(s) and dose(s) confirmed: Patient reports changes to the regimen as follows: Dose increase for Tacrolimus 1mg , patient is now taking 9 capsules by mouth 3 times a day   Changes to medications: [name] reports no changes at this time.  Changes to insurance: No  Questions for the pharmacist: No    Confirmed patient received Welcome Packet with first shipment. The patient will receive a drug information handout for each medication shipped and additional FDA Medication Guides as required.       DISEASE/MEDICATION-SPECIFIC INFORMATION        N/A    SPECIALTY MEDICATION ADHERENCE     Medication Adherence    Patient reported X missed doses in the last month: 0  Specialty Medication: Tacrolimus 1mg   Patient is on additional specialty medications: Yes  Additional Specialty Medications: Valganciclovir 450mg   Patient Reported Additional Medication X Missed Doses in the Last Month: 0  Informant: patient                Tacrolimus 1 mg: 2 days of medicine on hand   Valganciclovir 450 mg: 7 days of medicine on hand         SHIPPING     Shipping address confirmed in Epic.     Delivery Scheduled: Yes, Expected medication delivery date: 07/01/19.     Medication will be delivered via UPS to the prescription address in Epic WAM.    Elaine Koch   [facility] Specialty Technician

## 2019-07-01 DIAGNOSIS — Z940 Kidney transplant status: Principal | ICD-10-CM

## 2019-07-01 DIAGNOSIS — Z9483 Pancreas transplant status: Principal | ICD-10-CM

## 2019-07-02 LAB — BASIC METABOLIC PANEL
CALCIUM: 9.2 mg/dL
CHLORIDE: 113 mmol/L — ABNORMAL HIGH
CO2: 19 mmol/L — ABNORMAL LOW
CREATININE: 1.24 mg/dL — ABNORMAL HIGH
EGFR CKD-EPI AA FEMALE: 67 mL/min/{1.73_m2}
POTASSIUM: 4.5 mmol/L
SODIUM: 138 mmol/L

## 2019-07-02 LAB — MAGNESIUM
Lab: 1.6
MAGNESIUM: 1.6 mg/dL

## 2019-07-02 LAB — CBC W/ DIFFERENTIAL
HEMATOCRIT: 32.5 % — ABNORMAL LOW
HEMOGLOBIN: 10.6 g/dL — ABNORMAL LOW
MEAN CORPUSCULAR HEMOGLOBIN: 30.5 pg
MEAN CORPUSCULAR VOLUME: 93.7 fL
MEAN PLATELET VOLUME: 9.8 fL
PLATELET COUNT: 285 10*9/L
RED BLOOD CELL COUNT: 3.47 10*12/L — ABNORMAL LOW
RED CELL DISTRIBUTION WIDTH: 15.6 % — ABNORMAL HIGH
WBC ADJUSTED: 3.7 10*9/L — ABNORMAL LOW

## 2019-07-02 LAB — EGFR CKD-EPI NON-AA FEMALE: Lab: 0

## 2019-07-02 LAB — MONOCYTES RELATIVE PERCENT: Lab: 0

## 2019-07-02 LAB — PHOSPHORUS: Lab: 3.1

## 2019-07-02 LAB — ALBUMIN: Lab: 3.7

## 2019-07-02 LAB — AMYLASE: Lab: 128 — ABNORMAL HIGH

## 2019-07-02 LAB — LIPASE: Lab: 22

## 2019-07-05 DIAGNOSIS — Z9483 Pancreas transplant status: Principal | ICD-10-CM

## 2019-07-05 DIAGNOSIS — Z79899 Other long term (current) drug therapy: Principal | ICD-10-CM

## 2019-07-05 DIAGNOSIS — E0929 Drug or chemical induced diabetes mellitus with other diabetic kidney complication: Principal | ICD-10-CM

## 2019-07-05 DIAGNOSIS — Z940 Kidney transplant status: Principal | ICD-10-CM

## 2019-07-05 DIAGNOSIS — E559 Vitamin D deficiency, unspecified: Principal | ICD-10-CM

## 2019-07-06 ENCOUNTER — Ambulatory Visit: Admit: 2019-07-06 | Discharge: 2019-07-06 | Payer: MEDICARE

## 2019-07-06 DIAGNOSIS — Z940 Kidney transplant status: Principal | ICD-10-CM

## 2019-07-06 DIAGNOSIS — Z9483 Pancreas transplant status: Principal | ICD-10-CM

## 2019-07-06 DIAGNOSIS — Z79899 Other long term (current) drug therapy: Principal | ICD-10-CM

## 2019-07-06 DIAGNOSIS — I959 Hypotension, unspecified: Principal | ICD-10-CM

## 2019-07-06 LAB — PROTEIN / CREATININE RATIO, URINE: PROTEIN URINE: 7.8 mg/dL

## 2019-07-06 LAB — URINALYSIS
BACTERIA: NONE SEEN /HPF
BILIRUBIN UA: NEGATIVE
BLOOD UA: NEGATIVE
KETONES UA: NEGATIVE
LEUKOCYTE ESTERASE UA: NEGATIVE
NITRITE UA: NEGATIVE
PROTEIN UA: NEGATIVE
RBC UA: <1 /HPF (ref <=4)
SPECIFIC GRAVITY UA: 1.015 (ref 1.003–1.030)
SQUAMOUS EPITHELIAL: <1 /HPF (ref 0–5)
UROBILINOGEN UA: 0.2

## 2019-07-06 LAB — LIPASE: Triacylglycerol lipase:CCnc:Pt:Ser/Plas:Qn:: 77

## 2019-07-06 LAB — COMPREHENSIVE METABOLIC PANEL
ALBUMIN: 4 g/dL (ref 3.5–5.0)
ALKALINE PHOSPHATASE: 71 U/L (ref 38–126)
ALT (SGPT): 30 U/L (ref <35)
ANION GAP: 3 mmol/L — ABNORMAL LOW (ref 7–15)
AST (SGOT): 30 U/L (ref 14–38)
BILIRUBIN TOTAL: 0.6 mg/dL (ref 0.0–1.2)
BLOOD UREA NITROGEN: 28 mg/dL — ABNORMAL HIGH (ref 7–21)
BUN / CREAT RATIO: 20
CALCIUM: 9.8 mg/dL (ref 8.5–10.2)
CHLORIDE: 113 mmol/L — ABNORMAL HIGH (ref 98–107)
CO2: 24 mmol/L (ref 22.0–30.0)
CREATININE: 1.37 mg/dL — ABNORMAL HIGH (ref 0.60–1.00)
EGFR CKD-EPI NON-AA FEMALE: 51 mL/min/{1.73_m2} — ABNORMAL LOW (ref >=60)
GLUCOSE RANDOM: 78 mg/dL (ref 70–99)
POTASSIUM: 4.8 mmol/L (ref 3.5–5.0)
PROTEIN TOTAL: 7.3 g/dL (ref 6.5–8.3)
SODIUM: 140 mmol/L (ref 135–145)

## 2019-07-06 LAB — PROTEIN/CREAT RATIO, URINE: Protein/Creatinine:MRto:Pt:Urine:Qn:: 0.085

## 2019-07-06 LAB — AMYLASE: Amylase:CCnc:Pt:Ser/Plas:Qn:: 149 — ABNORMAL HIGH

## 2019-07-06 LAB — TACROLIMUS, TROUGH: Lab: 8.9

## 2019-07-06 LAB — SQUAMOUS EPITHELIAL: Lab: <1

## 2019-07-06 LAB — ALKALINE PHOSPHATASE: Alkaline phosphatase:CCnc:Pt:Ser/Plas:Qn:: 71

## 2019-07-06 MED ORDER — TACROLIMUS XR 4 MG TABLET,EXTENDED RELEASE 24 HR
ORAL_TABLET | ORAL | 11 refills | 0.00000 days | Status: CP
Start: 2019-07-06 — End: ?
  Filled 2019-07-07: qty 60, 30d supply, fill #0

## 2019-07-06 MED ORDER — CHOLECALCIFEROL (VITAMIN D3) 125 MCG (5,000 UNIT) CAPSULE
ORAL_CAPSULE | Freq: Every day | ORAL | 10 refills | 100 days | Status: CP
Start: 2019-07-06 — End: ?
  Filled 2019-07-07: qty 100, 100d supply, fill #0

## 2019-07-06 MED ORDER — METOCLOPRAMIDE 10 MG TABLET
ORAL_TABLET | 0 refills | 0 days
Start: 2019-07-06 — End: ?

## 2019-07-06 MED ORDER — GABAPENTIN 100 MG CAPSULE
ORAL_CAPSULE | 11 refills | 0.00000 days
Start: 2019-07-06 — End: ?

## 2019-07-06 MED ORDER — TACROLIMUS XR 1 MG TABLET,EXTENDED RELEASE 24 HR
ORAL_TABLET | ORAL | 11 refills | 0.00000 days | Status: CP
Start: 2019-07-06 — End: ?

## 2019-07-06 NOTE — Unmapped (Unsigned)
Transplant Nephrology Clinic Visit        Subjective/Interval:     Doing well, no acute issues, some head aches and tremors possible timing with tacro. Lvls finally stabilizing. BP used to run low all the time and finally being stabilized and running 120s. She has no other complaints.  Patient not demonstrating any symptoms of drug toxicity.  As of now patient with no acute issues, no new complaints, no fever chills or sweats. no chest pain palpitations orthopnea or shortness of breath. no lightheaded. no lower extremity edema. no abdominal pain no n/v/d. no myalgias or arthralgias. no dysuria hematuria or difficulty voiding.     She was asking about sending her 34yrs old to day care. She has first dose of covid vaccine before tx. As of now waiting on second shot. Discussed pros and cons of both sending and not sending. Encouraged her to make a decision based on these facts. As of now going to wait till we she is 3-4 months post tx.     Assessment:  Elaine Koch is a 34 y.o. 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 She was initially diagnosed with CKD age 42 and has been on hemodialysis since 2018. She receives iHD on T Th Sat through an RUE AVF with no significant complications. She was oliguric. She does not have history of peripheral vascular disease. Post transplant course has been uneventful and she is here following up with transplant nephrology.        Recommendations/Plan:     Allograft Function: Kidney Pancreas  05/03/19 KDPI 34%   Renal function holding relatively stable with electrolytes and acid base balanced.   Amylase lipase lvl has been stable since the initial slight bump. BS well controlled     Baseline Cr: Not established yet / trending ~ 1.2 as of now   Last Cr: 1.3 Date:   07/06/19   [place]/ BK     neg Date:   05/20/19  DSA     neg Date:   4/8//21  CMV:     neg Date:   06/01/19    05/27/19 (<50)   UPC:     0.133 Date:    06/01/19  Amylase:  128 Date: 07/01/19 down from  172 (06/29/19)  467 (05/27/19)   Prednisone : 5mg  / was started on 2nd week of April due to bump in amylase     ?? Continue current management   ?? Switch to envarsus today > discussed below   ?? Follow amylase lvls with labs   ?? Will wean and stop reglan   ?? hba1c next month   ?? Transition to [place] [facility]       Immunosuppression Management [High Risk Medical Decision Making For Drug Therapy Requiring Intensive Monitoring For Toxicity]:     Tacro target: 8-10   Tacro dose: 9 mg TID   Tacro last lvl /date: 8.9 07/01/19   MMF/MPA dose: 720  Wbc/anc count: 3.7/ 3.0  prednisone : 5mg    Side effects: headache / some numbness > but does nto appear to be directly related to tac     ?? Switching to envarsus 21mg    ?? Will wait till steady tacro goal lvls / then stop prednisone   ?? Continue with myfortic 720 as of now       Blood Pressure Management:  Initially had issues with low BP needing midodrine support. Now BP stabilized and will  stop the midodrine     Lipid Management:   Waiting for new lvls     Infectious Prophylaxis and Monitoring: CMV & EBV: D+/R+  ??  900 valcyte since CMV starting to be positive and need for higher IS   ?? Last cmv negative / continue with 900    Health Maintenance:  Covid : 1st shot done before TX/ will wait for second shot in 3 months   Flu: 2020 done   Pneumonia shot : completed as per patient           History of Present Illness    Elaine Koch is a 34 y.o. 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 She was initially diagnosed with CKD age 62 and has been on hemodialysis since 2018. She receives iHD on T Th Sat through an RUE AVF with no significant complications. She was oliguric. She does not have history of peripheral vascular disease. Post transplant course has been uneventful and she is here following up with transplant nephrology.       Transplant History:    Organ Received: DDKT // K/P transplant // 34% //   Native Kidney Disease: T1DM   Date of Transplant: 05/03/19   Post-Transplant Course: prompt   Prior Transplants: none  Induction: campath  Date of Ureteral Stent Removal:  06/10/19   CMV/EBV Status: CMV & EBV: D+/R+  Rejection Episodes: none  Donor Specific Antibodies: pending  Results of Renal Imaging (pre and post):     USS 05/10/19 No significant change compared with prior study. Overall stable resistive indices in the renal transplant arteries, slightly increased from prior, however within normal limits.    Significant notes post Tx: A blood culture from her donor grew 1/2 gram(+) cocci. ICID was consulted, and she was started on empiric vancomycin on 05/04/19. The species was later identified to be staph epidermidis, which was likely a contaminant of the blood draw, and vancomycin was discontinued (3/25)    Review of Systems    10 point ros were done and negative unless specified in the HPI     Medications  Current Outpatient Medications   Medication Sig Dispense Refill   ??? acetaminophen (TYLENOL) 500 MG tablet Take 1-2 tablets (500-1,000 mg total) by mouth every six (6) hours as needed for pain or fever greater than 100.51F (38C). 100 tablet 0   ??? aspirin (ECOTRIN) 81 MG tablet Take 1 tablet (81 mg total) by mouth daily. 30 tablet 11   ??? biotin 5 mg tablet Take 1 tablet (5 mg total) by mouth daily. 30 tablet 11   ??? docusate sodium (COLACE) 100 MG capsule Take 1 capsule (100 mg total) by mouth two (2) times a day as needed for constipation. 30 capsule 2   ??? gabapentin (NEURONTIN) 100 MG capsule Take 1 capsule (100 mg total) by mouth three (3) times a day. 90 capsule 11   ??? magnesium oxide-Mg AA chelate (MAGNESIUM, AMINO ACID CHELATE,) 133 mg Tab Take 1 tablet by mouth two (2) times a day. HOLD until directed to start by your coordinator. 100 tablet 11   ??? midodrine (PROAMATINE) 5 MG tablet Take 1 tablet (5 mg total) by mouth daily. 30 tablet 1   ??? multivitamin, prenatal, folic acid-iron, 27-1 mg Tab Take 1 tablet by mouth daily.     ??? mycophenolate (MYFORTIC) 180 MG EC tablet Take 4 tablets (720 mg total) by mouth two (2) times a  day. Adjust dose per medication card. 180 tablet 11   ??? pantoprazole (PROTONIX) 40 MG tablet Take 1 tablet (40 mg total) by mouth daily. 30 tablet 11   ??? predniSONE (DELTASONE) 5 MG tablet Take 1 tablet (5 mg total) by mouth daily. 30 tablet 11   ??? sulfamethoxazole-trimethoprim (BACTRIM) 400-80 mg per tablet Take 1 tablet (80 mg of trimethoprim total) by mouth every Monday, Wednesday, and Friday. 12 tablet 5   ??? tacrolimus (PROGRAF) 1 MG capsule Take 9 capsules (9 mg) by mouth three (3) times a day 810 capsule 11   ??? valGANciclovir (VALCYTE) 450 mg tablet Take 2 tablets (900 mg total) by mouth daily. 60 tablet 1     No current facility-administered medications for this visit.           Physical Exam    Vitals:    07/06/19 0924   BP: 120/74   Pulse: 85   Temp: 36.4 ??C (97.5 ??F)      Gen: built for age , without no distress,   Eyes: no pallor no icterus   ENT: wearing mask   Neck: wearing mask   CVS: s1 s2 heard, rhythm normal, 2/6 murmur   Resp: air entry b/l no creps   Abd: soft non tender incision clean and healed   Ext: no edema, no muscle wasting Large AVF right arm   Cns:  Aaox3, no tremor,grossly intact   Skin:  No new rash, no lesions       Laboratory Data and Imaging reviewed in EPIC      Counseling:  I counseled the patient on:  The need to avoid sun exposure and the use of sunblock while outdoors given the relatively higher risk of skin malignancy in an immunosuppressed state.  The need for adherence to immunosuppression medication.  Patient verbalized understanding.     Follow-Up:  Return to clinic in 4 weeks  Patient will continue to follow-up with his primary care provider for non-transplant related issues and medication refills. We have ordered transplant specific labs per the center's guidelines to monitor and assess for toxicities from immunosuppressant drug therapy          Nunzio Cobbs MD Transplant Nephrology  [facility] Division of Nephrology and Hypertension  07/06/2019  9:40 AM

## 2019-07-06 NOTE — Unmapped (Signed)
[facility] CLINIC PHARMACY NOTE  07/06/2019   [name]  [id number]    Medication changes today:   1. Decrease gabapentin to 100 mg BID x7d then 100 mg daily then stop  2. Start Envarsus 22 mg daily  3. Decrease metoclopramide to 10 mg daily for 7 days then stop   4. Stop midodrine    Education/Adherence tools provided today:  1.provided updated medication list  2.  provided additional education on immunosuppression and transplant related medications including reviewing indications of medications, dosing and side effects    Follow up items:  1. goal of understanding indications and dosing of immunosuppression medications   2. Consider changing PPI to H2RA at next visit  5. Consider stopping prednisone at next visit once Envarsus levels are stable  6. Contraception plan    Next visit with pharmacy in 1 month  ____________________________________________________________________    Elaine Koch is a 34 y.o. 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     Other PMH significant for hypertension, sickle cell trait positive, retinopathy, preeclampsia     Seen by pharmacy today for: medication management and pill box fill and adherence education; last seen by pharmacy 1 months ago     CC:  Patient complains of headaches in AM most days, weight gain w/prednisone    Vitals:    07/06/19 0928   BP: 120/74   Pulse: 85   Temp: 36.4 ??C (97.5 ??F)       Allergies   Allergen Reactions   ??? Iodinated Contrast Media Swelling, Rash and Other (See Comments)     Burning, warmth throughout body and tingling.  Throat swelling.  Treated with benadryl and symptoms improved.  Has not had contrast since then (2013 or 2014).   ??? Nickel Rash   ??? Propranolol Swelling   ??? Eye Irrigating Solution [Ophthalmic Irrigation Solution] Other (See Comments)     Contrast dye (name?) used in eyes caused hot feeling in face, reversed with Benadryl.    ??? Iodine      Other reaction(s): Skin Rash   ??? Naltrexone Rash   ??? Uni-Cortrom Rash       Medications reviewed in EPIC medication station and updated today by the clinical pharmacist practitioner.    Outpatient Encounter Medications as of 07/06/2019   Medication Sig Dispense Refill   ??? acetaminophen (TYLENOL) 500 MG tablet Take 1-2 tablets (500-1,000 mg total) by mouth every six (6) hours as needed for pain or fever greater than 100.53F (38C). 100 tablet 0   ??? aspirin (ECOTRIN) 81 MG tablet Take 1 tablet (81 mg total) by mouth daily. 30 tablet 11   ??? biotin 5 mg tablet Take 1 tablet (5 mg total) by mouth daily. 30 tablet 11   ??? docusate sodium (COLACE) 100 MG capsule Take 1 capsule (100 mg total) by mouth two (2) times a day as needed for constipation. 30 capsule 2   ??? gabapentin (NEURONTIN) 100 MG capsule Take 1 capsule (100 mg total) by mouth three (3) times a day. 90 capsule 11   ??? magnesium oxide-Mg AA chelate (MAGNESIUM, AMINO ACID CHELATE,) 133 mg Tab Take 1 tablet by mouth two (2) times a day. HOLD until directed to start by your coordinator. 100 tablet 11   ??? [EXPIRED] metoclopramide (REGLAN) 10 MG tablet Take 1 tablet (10 mg total) by mouth Two (2) times a day (30 minutes before a meal). 60 tablet 0   ???  midodrine (PROAMATINE) 5 MG tablet Take 1 tablet (5 mg total) by mouth daily. 30 tablet 1   ??? multivitamin, prenatal, folic acid-iron, 27-1 mg Tab Take 1 tablet by mouth daily.     ??? mycophenolate (MYFORTIC) 180 MG EC tablet Take 4 tablets (720 mg total) by mouth two (2) times a day. Adjust dose per medication card. 180 tablet 11   ??? pantoprazole (PROTONIX) 40 MG tablet Take 1 tablet (40 mg total) by mouth daily. 30 tablet 11   ??? predniSONE (DELTASONE) 5 MG tablet Take 1 tablet (5 mg total) by mouth daily. 30 tablet 11   ??? sulfamethoxazole-trimethoprim (BACTRIM) 400-80 mg per tablet Take 1 tablet (80 mg of trimethoprim total) by mouth every Monday, Wednesday, and Friday. 12 tablet 5   ??? tacrolimus (PROGRAF) 1 MG capsule Take 9 capsules (9 mg) by mouth three (3) times a day 810 capsule 11   ??? valGANciclovir (VALCYTE) 450 mg tablet Take 2 tablets (900 mg total) by mouth daily. 60 tablet 1     No facility-administered encounter medications on file as of 07/06/2019.     Induction agent : alemtuzumab    CURRENT IMMUNOSUPPRESSION: tacrolimus 9 mg TID   prograf/Envarsus/cyclosporine goal: 8-10   myfortic720 mg PO bid per protocol   prednisone 5mg  daily (started 05/27/19 given elevated pancreatic enzymes in setting of low tac levels)    Patient complains of mild HA most days and weight gain w/prednisone    IMMUNOSUPPRESSION DRUG LEVELS:  Lab Results   Component Value Date    Tacrolimus, Trough 8.9 07/01/2019    Tacrolimus, Trough 7.3 06/24/2019    Tacrolimus, Trough 7.4 06/21/2019    Tacrolimus Lvl 9.3 06/29/2019     No results found for: CYCLO  No results found for: EVEROLIMUS  No results found for: SIROLIMUS    Prograf level is accurate 12 hour trough    Graft function: stable  DSA: ntd  Zero hour biopsy: no abnormalities  Biopsies to date: ntd  UPC: 0.085 on 07/06/19  WBC/ANC:  wnl    Plan: Will transition to Envarsus 22 mg daily given high tacrolimus requirement.  Once level at goal on Envarsus (or at next visit) may consider stopping prednison. Per Dr. Matilde Haymaker, once pancreatic enzymes normalize and tac level at goal can consider stopping prednisone.  Continue to monitor.    OI Prophylaxis:   CMV Status: D+/ R+, moderate risk . CMV prophylaxis: valganciclovir 900 mg daily x 3 months per protocol.  Lab Results   Component Value Date    CMV Quant <50 (H) 05/27/2019   Estimated Creatinine Clearance: 65.1 mL/min (A) (based on SCr of 1.24 mg/dL (H)).  PCP Prophylaxis: bactrim SS 1 tab MWF x 6 months.  Thrush: completed in hospital  Patient is  tolerating infectious prophylaxis well    Plan: Continue to monitor.    CV Prophylaxis: asa 81 mg daily  The ASCVD Risk score Denman George DC Montez Hageman, et al., 2013) failed to calculate.  Statin therapy: Not indicated; currently on no statin Plan: Continue to monitor     BP: Goal < 140/90. Clinic vitals reported above.  Home BP ranges: 110-120/70s but asymptomatic which is improved from last week  Current meds include: midodrine 5mg  PO daily  Plan: Stop midodrine.  Continue to monitor.     Anemia of CKD:  H/H:   Lab Results   Component Value Date    HGB 10.6 (L) 07/01/2019     Lab Results   Component Value  Date    HCT 32.5 (L) 07/01/2019     Iron panel:  No results found for: IRON, TIBC, FERRITIN  No results found for: LABIRON    Prior ESA use: none post transplant    Plan: stable. Continue to monitor.     DM:   Lab Results   Component Value Date    A1C 5.5 06/21/2019   . Goal A1c < 7  History of Dm? Yes: T1DM  Currently on: no insulin post pancreas transplant  Home BS log: did not bring  Diet: good appetite  Exercise: not yet  Hypoglycemia: no  Plan:  No longer needs to check BG.  Continue to monitor    Fluid Intake: 50-100 oz  Plan: Continue to monitor     Electrolytes: wnl  Meds currently on: none  Plan: Continue to monitor     GI/BM: pt reports no diarrhea or constipation. No c/o nausea  Meds currently on: docusate PRN (not using), Miralax PRN (not using), metoclopramide 10 mg BID AC, MVI daily  Plan: Decrease metoclopramide to once daily for 7 days then stop.   Continue to monitor    Pain: pt reports no pain  Meds currently on: APAP PRN (uses occasionally for headache), gabapentin 100 mg TID (not on pre transplant - for mild foot neuropathy)  Plan: Taper gabapentin 100 mg BID x7d then 100 mg daily x7 then stop. Continue to monitor    Bone health:   Vitamin D Level: 24.8 on 06/01/19. Goal > 30.   Last DEXA results:  none available  Current meds include: none  Plan: Vitamin D level  out of goal; start cholecalciferol 5,000 units daily.  Continue to monitor.     Women's/Men's Health:  Seema Blum is a 34 y.o. Female of childbearing age. Patient reports no men's/women's health issues.  No longer has an IUD.  Plan: Asked pt to make apt w/GYN for IUD placement. Continue to monitor.    Hair thinning:  Meds currently on: biotin 5 mg daily  Plan: continue to monitor    Immunizations: first vaccine received pior to transplant, will wait for second dose in 3 months  Immunization History   Administered Date(s) Administered   ??? COVID-19 VACC,MRNA,(PFIZER)(PF)(IM) 04/29/2019   ??? HPV Quadrivalent (Gardasil) 05/28/2012   ??? Hepatitis B, Adult 12/10/2017   ??? Influenza Vaccine Quad (IIV4 PF) 45mo+ injectable 10/17/2015, 01/29/2019   ??? Influenza Virus Vaccine, unspecified formulation 11/12/2016, 01/31/2019   ??? PNEUMOCOCCAL POLYSACCHARIDE 23 12/20/2016   ??? TdaP 10/17/2015     Pharmacy preference:  [facility]    Adherence: Patient has average understanding of medications; was able to identify names/doses of immunosuppressants and OI meds with help from her mother.  Patient  does fill their own pill box on a regular basis at home.  Patient brought medication card:yes  Pill box:was correct  Plan: provided moderate adherence counseling/intervention    Patient was reviewed with Dr. Detwiler/Dr. Lovena Neighbours who was agreement with the stated plan:     During this visit, the following was completed:   BG log data assessment  BP log data assessment  Labs ordered and evaluated  complex treatment plan >1 DS   I spent a total of 20 minutes face to face with the patient delivering clinical care and providing education/counseling.    All questions/concerns were addressed to the patient's satisfaction.  __________________________________________  Cleone Slim, PHARMD, CPP  [employer] CLINICAL PHARMACIST PRACTITIONER  PAGER [phone]

## 2019-07-06 NOTE — Unmapped (Signed)
Urine collected and sent to lab.

## 2019-07-06 NOTE — Unmapped (Signed)
[facility] Clinical Assessment & Refill Coordination Note    Elaine Koch, [place]: 1985-11-04  Phone: [phone] (home)     All above HIPAA information was verified with patient.     Was a Nurse, learning disability used for this call? No    Specialty Medication(s):   Transplant: Envarsus 1mg , Envarsus 4mg ,  mycophenolic acid 180mg , tacrolimus \\1mg  and valgancyclovir 450mg      Current Outpatient Medications   Medication Sig Dispense Refill   ??? acetaminophen (TYLENOL) 500 MG tablet Take 1-2 tablets (500-1,000 mg total) by mouth every six (6) hours as needed for pain or fever greater than 100.15F (38C). 100 tablet 0   ??? aspirin (ECOTRIN) 81 MG tablet Take 1 tablet (81 mg total) by mouth daily. 30 tablet 11   ??? biotin 5 mg tablet Take 1 tablet (5 mg total) by mouth daily. 30 tablet 11   ??? cholecalciferol, vitamin D3-125 mcg, 5,000 unit,, 125 mcg (5,000 unit) tablet Take 1 tablet (125 mcg total) by mouth daily. 100 tablet 10   ??? gabapentin (NEURONTIN) 100 MG capsule Take 1 capsule twice daily for 7 days then 1 capsule daily for 7 days then stop 90 capsule 11   ??? midodrine (PROAMATINE) 5 MG tablet Take 1 tablet (5 mg total) by mouth daily. 30 tablet 1   ??? multivitamin, prenatal, folic acid-iron, 27-1 mg Tab Take 1 tablet by mouth daily.     ??? mycophenolate (MYFORTIC) 180 MG EC tablet Take 4 tablets (720 mg total) by mouth two (2) times a day. Adjust dose per medication card. 180 tablet 11   ??? pantoprazole (PROTONIX) 40 MG tablet Take 1 tablet (40 mg total) by mouth daily. 30 tablet 11   ??? predniSONE (DELTASONE) 5 MG tablet Take 1 tablet (5 mg total) by mouth daily. 30 tablet 11   ??? sulfamethoxazole-trimethoprim (BACTRIM) 400-80 mg per tablet Take 1 tablet (80 mg of trimethoprim total) by mouth every Monday, Wednesday, and Friday. 12 tablet 5   ??? tacrolimus (ENVARSUS XR) 1 mg Tb24 extended release tablet Take 2 tablets (2mg ) by mouth once daily in addition to 4 mg tablets for total dose of 22 mg daily. 60 tablet 11   ??? tacrolimus (ENVARSUS XR) 4 mg Tb24 extended release tablet Take 5 tablets (20mg ) by mouth once daily in addition to 1 mg tablets for total dose of 22 mg daily. 150 tablet 11   ??? valGANciclovir (VALCYTE) 450 mg tablet Take 2 tablets (900 mg total) by mouth daily. 60 tablet 1     No current facility-administered medications for this visit.        Changes to medications: stopping tac to start envarsus    Allergies   Allergen Reactions   ??? Iodinated Contrast Media Swelling, Rash and Other (See Comments)     Burning, warmth throughout body and tingling.  Throat swelling.  Treated with benadryl and symptoms improved.  Has not had contrast since then (2013 or 2014).   ??? Nickel Rash   ??? Propranolol Swelling   ??? Eye Irrigating Solution [Ophthalmic Irrigation Solution] Other (See Comments)     Contrast dye (name?) used in eyes caused hot feeling in face, reversed with Benadryl.    ??? Iodine      Other reaction(s): Skin Rash   ??? Naltrexone Rash   ??? Uni-Cortrom Rash       Changes to allergies: No    SPECIALTY MEDICATION ADHERENCE     envarsus 1mg   : 0  days of medicine on hand   envarsus 4mg   : 0 days of medicine on hand   Tacrolimus 1mg   : 25 days of medicine on hand   valganciclovir 450mg   25 days of medicine on hand   Mycophenolate 180mg   : 20 days of medicine on hand     Medication Adherence    Patient reported X missed doses in the last month: 0  Specialty Medication: tacrolimus 1mg   Patient is on additional specialty medications: Yes  Additional Specialty Medications: Mycophenolate 180mg   Patient Reported Additional Medication X Missed Doses in the Last Month: 0  Patient is on more than two specialty medications: Yes  Specialty Medication: valganciclovir 450mg   Patient Reported Additional Medication X Missed Doses in the Last Month: 0  Specialty Medication: envarsus 1mg   Patient Reported Additional Medication X Missed Doses in the Last Month: 0  Specialty Medication: envarsus 4mg   Patient Reported Additional Medication X Missed Doses in the Last Month: 0          Specialty medication(s) dose(s) confirmed: Patient reports changes to the regimen as follows: stopping tac to start envarsus     Are there any concerns with adherence? No    Adherence counseling provided? Not needed    CLINICAL MANAGEMENT AND INTERVENTION      Clinical Benefit Assessment:    Do you feel the medicine is effective or helping your condition? Yes    Clinical Benefit counseling provided? Not needed    Adverse Effects Assessment:    Are you experiencing any side effects? No    Are you experiencing difficulty administering your medicine? No    Quality of Life Assessment:    How many days over the past month did your \\transplant   keep you from your normal activities? For example, brushing your teeth or getting up in the morning. 0    Have you discussed this with your provider? Not needed    Therapy Appropriateness:    Is therapy appropriate? Yes, therapy is appropriate and should be continued    DISEASE/MEDICATION-SPECIFIC INFORMATION      N/A    PATIENT SPECIFIC NEEDS     - Does the patient have any physical, cognitive, or cultural barriers? No    - Is the patient high risk? Yes, patient is taking a REMS drug. Medication is dispensed in compliance with REMS program.     - Does the patient require a Care Management Plan? No     - Does the patient require physician intervention or other additional services (i.e. nutrition, smoking cessation, social work)? No      SHIPPING     Specialty Medication(s) to be Shipped:   Transplant: Envarsus 1mg  and Envarsus 4mg     Other medication(s) to be shipped: vit d, protonix     Changes to insurance: No    Delivery Scheduled: Yes, Expected medication delivery date: 07/08/2019.     Medication will be delivered via UPS to the confirmed prescription address in [facility].    The patient will receive a drug information handout for each medication shipped and additional FDA Medication Guides as required.  Verified that patient has previously received a Conservation officer, historic buildings.    All of the patient's questions and concerns have been addressed.    Thad Ranger   [facility] Specialty Pharmacist

## 2019-07-06 NOTE — Unmapped (Signed)
The following medication is onboarded in this ntoe:  1. [employer]    [facility]   Patient Onboarding/Medication Counseling    Elaine Koch is a 34 y.o. 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  I am speaking to the patient.    Was a Nurse, learning disability used for this call? No    Verified patient's date of birth / HIPAA.    Specialty medication(s) to be sent: Transplant: Envarsus 1mg  and Envarsus 4mg       Non-specialty medications/supplies to be sent: vit d, protonix      Medications not needed at this time: all other meds - wants call back in 2 weeks         Envarsus (tacrolimus XR)    Medication & Administration     Dosage: take 22mg  total (5x4mg  tablets and 2x1mg  tablets per dose) by mouth once daily    Administration:   ??? Take in the morning on empty stomach ??? 1 hour before or 2 hours after breakfast  ??? Take with drink of water  ??? Do not crush, break, or chew tablets    Adherence/Missed dose instructions:  ??? Take a missed dose as soon as you think about it.  ??? If it has been more than 15 hours since missed dose, skip the missed dose and go back to your regular time  ??? Report all missed doses to transplant coordinator    Goals of Therapy     ??? To prevent organ rejection    Side Effects & Monitoring Parameters     ??? Common side effects  ??? Fatigue  ??? Headache  ??? Stuffy nose or sore throat  ??? Nausea, vomiting, stomach pain, diarrhea, constipation  ??? Heartburn  ??? Back or joint pain  ??? Increased risk of infection    ??? The following side effects should be reported to the provider:  ??? Allergic reaction  ??? Kidney issues (change in quantity or urine passed, blood in urine, or weight gain)  ??? High blood pressure (dizziness, change in eyesight, headache)  ??? Electrolyte issues (change in mood, confusion, muscle pain, or weakness)  ??? Abnormal breathing  ??? Shakiness  ??? Unexplained bleeding or bruising (gums bleeding, blood in urine, nosebleeds, any abnormal bleeding) ??? Signs of infection    ??? Monitoring Parameters  ??? Renal function  ??? Liver function  ??? Glucose levels  ??? Blood pressure  ??? Tacrolimus trough levels      Contraindications, Warnings, & Precautions     ??? Black Box Warning: Infections - immunosuppressant agents increase the risk of infection that may lead to hospitalization or death  ??? Black Box Warning: Malignancy - immunosuppressant agents may be associated with the development of malignancies that may lead to hospitalization or death.  Limit or avoid sun and ultraviolet light exposure, use appropriate sun protection  ??? Myocardial hypertrophy -avoid use in patients with congenital long QT syndrome  ??? Diabetes mellitus - the risk for new-onset diabetes and insulin-dependent post-transplant diabetes mellitus is increased with tacrolimus use after transplantation  ??? GI perforation  ??? Hyperkalemia  ??? Hypertension  ??? Nephrotoxicity  ??? Neurotoxicity    Drug/Food Interactions     ??? Medication list reviewed in Epic. no interactions noted that clinic is not already monitoring.   ??? Due to amount and severity of drug interactions, report ALL medications starts, discontinuations, and changes to transplant coordinator prior to making the change  ??? Avoid alcohol  ???  Avoid grapefruit or grapefruit juice  ??? Avoid live vaccines    Storage, Handling Precautions, & Disposal     ??? Store at room temperature  ??? Keep away from children and pets      Current Medications (including OTC/herbals), Comorbidities and Allergies     Current Outpatient Medications   Medication Sig Dispense Refill   ??? acetaminophen (TYLENOL) 500 MG tablet Take 1-2 tablets (500-1,000 mg total) by mouth every six (6) hours as needed for pain or fever greater than 100.49F (38C). 100 tablet 0   ??? aspirin (ECOTRIN) 81 MG tablet Take 1 tablet (81 mg total) by mouth daily. 30 tablet 11   ??? biotin 5 mg tablet Take 1 tablet (5 mg total) by mouth daily. 30 tablet 11   ??? cholecalciferol, vitamin D3-125 mcg, 5,000 unit,, 125 mcg (5,000 unit) tablet Take 1 tablet (125 mcg total) by mouth daily. 100 tablet 10   ??? gabapentin (NEURONTIN) 100 MG capsule Take 1 capsule twice daily for 7 days then 1 capsule daily for 7 days then stop 90 capsule 11   ??? midodrine (PROAMATINE) 5 MG tablet Take 1 tablet (5 mg total) by mouth daily. 30 tablet 1   ??? multivitamin, prenatal, folic acid-iron, 27-1 mg Tab Take 1 tablet by mouth daily.     ??? mycophenolate (MYFORTIC) 180 MG EC tablet Take 4 tablets (720 mg total) by mouth two (2) times a day. Adjust dose per medication card. 180 tablet 11   ??? pantoprazole (PROTONIX) 40 MG tablet Take 1 tablet (40 mg total) by mouth daily. 30 tablet 11   ??? predniSONE (DELTASONE) 5 MG tablet Take 1 tablet (5 mg total) by mouth daily. 30 tablet 11   ??? sulfamethoxazole-trimethoprim (BACTRIM) 400-80 mg per tablet Take 1 tablet (80 mg of trimethoprim total) by mouth every Monday, Wednesday, and Friday. 12 tablet 5   ??? tacrolimus (ENVARSUS XR) 1 mg Tb24 extended release tablet Take 2 tablets (2mg ) by mout once daily in addition to 4 mg tablets for total dose of 22 mg daily. 60 tablet 11   ??? tacrolimus (ENVARSUS XR) 4 mg Tb24 extended release tablet Take 5 tablets (20mg ) by mouth once daily in addition to 1 mg tablets for total dose of 22 mg daily. 150 tablet 11   ??? valGANciclovir (VALCYTE) 450 mg tablet Take 2 tablets (900 mg total) by mouth daily. 60 tablet 1     No current facility-administered medications for this visit.       Allergies   Allergen Reactions   ??? Iodinated Contrast Media Swelling, Rash and Other (See Comments)     Burning, warmth throughout body and tingling.  Throat swelling.  Treated with benadryl and symptoms improved.  Has not had contrast since then (2013 or 2014).   ??? Nickel Rash   ??? Propranolol Swelling   ??? Eye Irrigating Solution [Ophthalmic Irrigation Solution] Other (See Comments)     Contrast dye (name?) used in eyes caused hot feeling in face, reversed with Benadryl.    ??? Iodine      Other reaction(s): Skin Rash   ??? Naltrexone Rash   ??? Uni-Cortrom Rash       Patient Active Problem List   Diagnosis   ??? Anemia, chronic disease   ??? Diabetic nephropathy (CMS-HCC)   ??? Diabetic retinopathy (CMS-HCC)   ??? Nephrotic syndrome   ??? Type 1 diabetes mellitus (CMS-HCC)   ??? Sickle cell trait (CMS-HCC)   ??? Other secondary hypertension   ???  History of blood transfusion   ??? Cardiomyopathy (CMS-HCC)   ??? History of simultaneous kidney and pancreas transplant (CMS-HCC)   ??? Hypotension   ??? 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       Reviewed and up to date in Epic.    Appropriateness of Therapy     Is medication and dose appropriate based on diagnosis? Yes    Prescription has been clinically reviewed: Yes    Baseline Quality of Life Assessment      How many days over the past month did your transplant  keep you from your normal activities? For example, brushing your teeth or getting up in the morning. 0    Financial Information     Medication Assistance provided: None Required    Anticipated copay of $0 on part b reviewed with patient. Verified delivery address.    Delivery Information     Scheduled delivery date: 07/07/2019    Expected start date: pt will coordinate with clinic on when to start. I will message coordinator as well today    Medication will be delivered via UPS to the prescription address in Epic WAM.  This shipment will require a signature.      Explained the services we provide at [facility] and that each month we would call to set up refills.  Stressed importance of returning phone calls so that we could ensure they receive their medications in time each month.  Informed patient that we should be setting up refills 7-10 days prior to when they will run out of medication.  A pharmacist will reach out to perform a clinical assessment periodically.  Informed patient that a welcome packet and a drug information handout will be sent.      Patient verbalized understanding of the above information as well as how to contact the pharmacy at [phone] option 4 with any questions/concerns.  The pharmacy is open Monday through Friday 8:30am-4:30pm.  A pharmacist is available 24/7 via pager to answer any clinical questions they may have.    Patient Specific Needs     - Does the patient have any physical, cognitive, or cultural barriers? No    - Patient prefers to have medications discussed with  Patient     - Is the patient or caregiver able to read and understand education materials at a high school level or above? Yes    - Patient's primary language is  English     - Is the patient high risk? Yes, patient is taking a REMS drug. Medication is dispensed in compliance with REMS program.     - Does the patient require a Care Management Plan? No     - Does the patient require physician intervention or other additional services (i.e. nutrition, smoking cessation, social work)? No      Thad Ranger  [facility] Specialty Pharmacist

## 2019-07-06 NOTE — Unmapped (Signed)
[facility] Specialty Medication Onboarding    Specialty Medication: ENVARSUS XR 1MG  AND 4MG  TABLETS  Prior Authorization: Not Required   Financial Assistance: No - copay  <$25  Final Copay/Day Supply: $0 / 30 DAYS EACH    Insurance Restrictions: Yes - max 1 month supply     Notes to Pharmacist: MEDICATION CHANGE    The triage team has completed the benefits investigation and has determined that the patient is able to fill this medication at [facility] Please contact the patient to complete the onboarding or follow up with the prescribing physician as needed.

## 2019-07-07 MED FILL — PANTOPRAZOLE 40 MG TABLET,DELAYED RELEASE: ORAL | 30 days supply | Qty: 30 | Fill #1

## 2019-07-07 MED FILL — CHOLECALCIFEROL (VITAMIN D3) 125 MCG (5,000 UNIT) CAPSULE: 100 days supply | Qty: 100 | Fill #0 | Status: AC

## 2019-07-07 MED FILL — ENVARSUS XR 4 MG TABLET,EXTENDED RELEASE: 30 days supply | Qty: 150 | Fill #0 | Status: AC

## 2019-07-07 MED FILL — PANTOPRAZOLE 40 MG TABLET,DELAYED RELEASE: 30 days supply | Qty: 30 | Fill #1 | Status: AC

## 2019-07-07 MED FILL — ENVARSUS XR 1 MG TABLET,EXTENDED RELEASE: 30 days supply | Qty: 60 | Fill #0 | Status: AC

## 2019-07-07 MED FILL — ENVARSUS XR 4 MG TABLET,EXTENDED RELEASE: 30 days supply | Qty: 150 | Fill #0

## 2019-07-07 NOTE — Unmapped (Signed)
Client Coordination Note and Client Missed Visit report from 5/24 sent to HIM Suan Halter  Jul 07, 2019 1:59 PM

## 2019-07-08 DIAGNOSIS — Z9483 Pancreas transplant status: Principal | ICD-10-CM

## 2019-07-08 DIAGNOSIS — Z940 Kidney transplant status: Principal | ICD-10-CM

## 2019-07-08 LAB — BK VIRUS QUANTITATIVE PCR, BLOOD: BK BLOOD RESULT: NOT DETECTED

## 2019-07-08 LAB — BK BLOOD LOG(10): Lab: 0

## 2019-07-08 LAB — VITAMIN D, TOTAL (25OH): Lab: 27.2

## 2019-07-09 LAB — VITAMIN D 1,25-DIHYDROXY: 1,25-Dihydroxyvitamin D:MCnc:Pt:Ser/Plas:Qn:: 37

## 2019-07-11 NOTE — Unmapped (Signed)
rec'd call from pt regarding c/o H/A, throwing up, diarrhea. Denies fever. Took some tylenol for H/a with some relief. Pt said she took her morning meds and they stayed down. Diarrhea has been several times since waking up today, and now very clear liquid stools.    Pt advised to stay home, bland diet, keep getting her fluids down.  If she continues with diarrhea tomorrow or Tuesday she should give a stool sample  She voiced understanding.

## 2019-07-12 DIAGNOSIS — Z9483 Pancreas transplant status: Principal | ICD-10-CM

## 2019-07-12 DIAGNOSIS — Z940 Kidney transplant status: Principal | ICD-10-CM

## 2019-07-13 ENCOUNTER — Ambulatory Visit
Admit: 2019-07-13 | Discharge: 2019-07-16 | Disposition: A | Discharge status: Home / Self Care | Payer: MEDICARE | Admitting: Surgery

## 2019-07-13 DIAGNOSIS — Z9483 Pancreas transplant status: Principal | ICD-10-CM

## 2019-07-13 DIAGNOSIS — Z940 Kidney transplant status: Principal | ICD-10-CM

## 2019-07-13 LAB — TROPONIN I: Troponin I.cardiac:MCnc:Pt:Ser/Plas:Qn:: <0.034

## 2019-07-13 LAB — COMPREHENSIVE METABOLIC PANEL
ALBUMIN: 4.5 g/dL (ref 3.5–5.0)
ALKALINE PHOSPHATASE: 81 U/L (ref 38–126)
ALT (SGPT): 25 U/L (ref <35)
ANION GAP: 12 mmol/L (ref 7–15)
AST (SGOT): 27 U/L (ref 14–38)
BILIRUBIN TOTAL: 1.1 mg/dL (ref 0.0–1.2)
BLOOD UREA NITROGEN: 43 mg/dL — ABNORMAL HIGH (ref 7–21)
BUN / CREAT RATIO: 13
CALCIUM: 9.7 mg/dL (ref 8.5–10.2)
CHLORIDE: 111 mmol/L — ABNORMAL HIGH (ref 98–107)
CO2: 17 mmol/L — ABNORMAL LOW (ref 22.0–30.0)
CREATININE: 3.22 mg/dL — ABNORMAL HIGH (ref 0.60–1.00)
EGFR CKD-EPI AA FEMALE: 21 mL/min/{1.73_m2} — ABNORMAL LOW (ref >=60)
EGFR CKD-EPI NON-AA FEMALE: 18 mL/min/{1.73_m2} — ABNORMAL LOW (ref >=60)
GLUCOSE RANDOM: 99 mg/dL (ref 70–179)
POTASSIUM: 5 mmol/L (ref 3.5–5.0)
PROTEIN TOTAL: 8.3 g/dL (ref 6.5–8.3)

## 2019-07-13 LAB — CBC W/ AUTO DIFF
BASOPHILS ABSOLUTE COUNT: 0 10*9/L (ref 0.0–0.1)
BASOPHILS RELATIVE PERCENT: 0.6 %
EOSINOPHILS ABSOLUTE COUNT: 0 10*9/L (ref 0.0–0.4)
EOSINOPHILS RELATIVE PERCENT: 1.7 %
HEMATOCRIT: 38.7 % (ref 36.0–46.0)
HEMOGLOBIN: 12.3 g/dL (ref 12.0–16.0)
LARGE UNSTAINED CELLS: 2 % (ref 0–4)
LYMPHOCYTES ABSOLUTE COUNT: 0.1 10*9/L — ABNORMAL LOW (ref 1.5–5.0)
LYMPHOCYTES RELATIVE PERCENT: 5.4 %
MEAN CORPUSCULAR HEMOGLOBIN: 30.5 pg (ref 26.0–34.0)
MEAN CORPUSCULAR VOLUME: 96.1 fL (ref 80.0–100.0)
MEAN PLATELET VOLUME: 8.3 fL (ref 7.0–10.0)
MONOCYTES RELATIVE PERCENT: 5.7 %
NEUTROPHILS ABSOLUTE COUNT: 2 10*9/L (ref 2.0–7.5)
NEUTROPHILS RELATIVE PERCENT: 85.1 %
PLATELET COUNT: 286 10*9/L (ref 150–440)
RED BLOOD CELL COUNT: 4.03 10*12/L (ref 4.00–5.20)
RED CELL DISTRIBUTION WIDTH: 16.8 % — ABNORMAL HIGH (ref 12.0–15.0)

## 2019-07-13 LAB — SMEAR REVIEW

## 2019-07-13 LAB — BASOPHILS ABSOLUTE COUNT: Basophils:NCnc:Pt:Bld:Qn:Automated count: 0

## 2019-07-13 LAB — MAGNESIUM: Magnesium:MCnc:Pt:Ser/Plas:Qn:: 1.6

## 2019-07-13 LAB — GLUCOSE RANDOM: Glucose:MCnc:Pt:Ser/Plas:Qn:: 99

## 2019-07-13 LAB — LIPASE: Triacylglycerol lipase:CCnc:Pt:Ser/Plas:Qn:: 128

## 2019-07-13 LAB — AMYLASE: Amylase:CCnc:Pt:Ser/Plas:Qn:: 181 — ABNORMAL HIGH

## 2019-07-13 LAB — POTASSIUM: Potassium:SCnc:Pt:Ser/Plas:Qn:: 5.6 — ABNORMAL HIGH

## 2019-07-13 NOTE — Unmapped (Signed)
Patient called to report blood pressure 143/110 with chest pain. Pt advised to go to local ER for chest pain evaluation.

## 2019-07-13 NOTE — Unmapped (Signed)
[facility]  Emergency Department Provider Note    ED Clinical Impression     Final diagnoses:   Renal transplant, status post (Primary)   AKI (acute kidney injury) (CMS-HCC)       Initial Impression, ED Course, Assessment and Plan     Impression: Elaine Koch is a 34 y.o. 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 Secondarily with chest pain and reported HTN.    On arrival to the ER, blood pressure 89/60. Afebrile. Appears nontoxic and in no acute distress. Lungs clear to auscultation bilaterally. Cardiac exam unremarkable. Abdomen soft, nontender, nondistended. No lower extremity edema.    Exact source of her symptoms is unclear. Consider gastritis, enteritis, PUD, as well as colitis. She is on Bactrim. Could consider C. Difficile or opportunistic infection in setting of transplant. No concern for acute surgical abdomen or peritonitis. Also felt less likely to be hepatitis, cholecystitis, pancreatitis, appendicitis, diverticulitis, cystitis, pyelonephritis, and nephrolithiasis based on history and physical. Low suspicion for AAA, mesenteric ischemia, SBO, or volvulus given history, vital signs, and physical exam. Given female of childbearing age, consider IUP and ectopic. History of physical not consistent with pelvic pathology.    Consider electrolyte disturbances and acute dehydration in the setting of GI losses. Consider transplant rejection as well. Patient has a headache, but this appears to be a secondary concern. Based on history and physical, no concern for subarachnoid hemorrhage, CVA, TIA, or other sinister pathology. She had some chest pain today, but overall history is less consistent with cardiopulmonary abnormality. Felt unlikely to be ACS, arrhythmia, pneumonia, myocarditis, pericarditis, pulmonary edema, pleural effusion, pneumothorax, PE, or aortic dissection based on history and physical. Consider esophageal irritation but without concern for perforation. Patient reports HTN, but this was reportedly ~140s/110s. No longer hypertensive. No concern for hypertensive emergency.    We will plan for labs, EKG, chest x-ray, IV fluids, Phenergan, and Benadryl.    2:53 PM  White blood blood cell count 2.4. Hemoglobin 12.3. Platelets 286. CMP with a creatinine of 3.22, which is up from 1.37. Potassium 5.0. Bicarbonate 17 from 24. Lipase 128. Troponin negative.    Certainly, appears to have an AKI in the setting of renal transplant. Uncertain if intrinsic renal disease/rejection in the setting of transplant. Also consider contribution of GI losses. We will continue IV fluids and obtain transplant renal ultrasound. We will plan for admission. Given these findings, felt even less likely to be ACS or primary cardiac etiology. No indication to trend troponin.    XR Chest 1 view Portable   Final Result   No acute airspace disease.       US Renal Transplant W Doppler    (Results Pending)     EKG with normal sinus rhythm. Normal intervals and axis. No pathologic T wave inversions or ST segment elevations. No delta waves or Brugada pattern.    I subsequently spoke with the admitting service, who will assume care for admission. Patient signed out to Dr. Jennefer Bravo with patient awaiting admission.    Additional Medical Decision Making     I have reviewed the vital signs and the nursing notes. Labs and radiology results that were available during my care of the patient were independently reviewed by me and considered in my medical decision making.     I discussed the case with the ED attending, Dr.  Dortha Schwalbe.    I independently visualized the EKG tracing.   I independently visualized the radiology images.   I reviewed the patient's prior medical records.   I discussed the case with the admitting provider.     Portions of this record have been created using Scientist, clinical (histocompatibility and immunogenetics). Dictation errors have been sought, but may not have been identified and corrected. If any questions arise regarding the content of the note, please address the author for clarification.  ____________________________________________       History     Chief Complaint  High Blood Pressure      HPI   Elaine Koch is a 34 y.o. 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 Secondarily with chest pain and reported HTN. Patient endorses onset of symptoms 5/29. Has had nausea, vomiting, and diarrhea. This is non-bloody and non-bilious. The symptoms seemed to get better on 5/30 but then became worse yesterday. She is on Bactrim but denies any other recent antibiotics. This morning, she awoke and had some anterior chest discomfort. This seemed to improve after vomiting. Other associated symptoms include a frontal headache and BP reported at ~140/110. No documented fevers, dyspnea, dysuria, or significant abdominal pain. Her primary concerns at this time are nausea and headache.      Past Medical History  Past Medical History:   Diagnosis Date   ??? Chronic hypertension during pregnancy, antepartum 07/22/2015    Overview:  Methyldopa recommended per nephrologist if needed   ??? Diabetes mellitus type 1 (CMS-HCC)    ??? ESRD (end stage renal disease) (CMS-HCC)    ??? History of pre-eclampsia 10/24/2016   ??? 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 Stage 5 chronic kidney disease on chronic dialysis (CMS-HCC) 10/24/2016       Patient Active Problem List   Diagnosis   ??? Anemia, chronic disease   ??? Diabetic nephropathy (CMS-HCC)   ??? Diabetic retinopathy (CMS-HCC)   ??? Nephrotic syndrome   ??? Type 1 diabetes mellitus (CMS-HCC)   ??? Sickle cell trait (CMS-HCC)   ??? Other secondary hypertension   ??? History of blood transfusion   ??? Cardiomyopathy (CMS-HCC)   ??? History of simultaneous kidney and pancreas transplant (CMS-HCC)   ??? Hypotension   ??? 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       Past Surgical History  Past Surgical History:   Procedure Laterality Date   ??? AV FISTULA PLACEMENT  2018   ??? CESAREAN SECTION     ??? PR TRANSPLANT ALLOGRAFT PANCREAS N/A 05/03/2019    Procedure: TRANSPLANTATION OF PANCREATIC ALLOGRAFT;  Surgeon: Leona Carry, MD;  Location: MAIN OR [facility];  Service: Transplant   ??? PR TRANSPLANT,PREP CADAVER RENAL GRAFT N/A 05/03/2019    Procedure: [facility] STD PREP CAD DONR RENAL ALLOGFT PRIOR TO TRNSPLNT, INCL DISSEC/REM PERINEPH FAT, DIAPH/RTPER ATTAC;  Surgeon: Leona Carry, MD;  Location: MAIN OR [facility];  Service: Transplant   ??? PR TRANSPLANT,PREP DONOR PANCREAS N/A 05/03/2019    Procedure: [facility] STANDARD PREPARATION OF CADAVER DONOR PANCREAS ALLOGRAFT PRIOR TO TRANSPLANTATION;  Surgeon: Leona Carry, MD;  Location: MAIN OR [facility];  Service: Transplant   ??? PR TRANSPLANTATION OF KIDNEY N/A 05/03/2019    Procedure: RENAL ALLOTRANSPLANTATION, IMPLANTATION OF GRAFT; WITHOUT RECIPIENT NEPHRECTOMY;  Surgeon: Leona Carry, MD;  Location: MAIN OR [facility];  Service: Transplant   ???  TONSILLECTOMY           Current Facility-Administered Medications:   ???  diphenhydrAMINE (BENADRYL) capsule/tablet 25 mg, 25 mg, Oral, Once, Janit Pagan, MD  ???  lactated ringers bolus 1,000 mL, 1,000 mL, Intravenous, Once, Janit Pagan, MD  ???  promethazine (PHENERGAN) 12.5 mg in sodium chloride (NS) 0.9 % 25 mL infusion, 12.5 mg, Intravenous, Once, Janit Pagan, MD    Current Outpatient Medications:   ???  acetaminophen (TYLENOL) 500 MG tablet, Take 1-2 tablets (500-1,000 mg total) by mouth every six (6) hours as needed for pain or fever greater than 100.95F (38C)., Disp: 100 tablet, Rfl: 0  ???  aspirin (ECOTRIN) 81 MG tablet, Take 1 tablet (81 mg total) by mouth daily., Disp: 30 tablet, Rfl: 11  ???  biotin 5 mg tablet, Take 1 tablet (5 mg total) by mouth daily., Disp: 30 tablet, Rfl: 11  ???  cholecalciferol, vitamin D3-125 mcg, 5,000 unit,, 125 mcg (5,000 unit) capsule, Take 1 capsule (125 mcg total) by mouth daily., Disp: 100 capsule, Rfl: 10  ???  gabapentin (NEURONTIN) 100 MG capsule, Take 1 capsule twice daily for 7 days then 1 capsule daily for 7 days then stop, Disp: 90 capsule, Rfl: 11  ???  metoclopramide (REGLAN) 10 MG tablet, Take 1 tablet once daily for 7 days then stop (last day 07/12/19), Disp: 60 tablet, Rfl: 0  ???  multivitamin, prenatal, folic acid-iron, 27-1 mg Tab, Take 1 tablet by mouth daily., Disp: , Rfl:   ???  mycophenolate (MYFORTIC) 180 MG EC tablet, Take 4 tablets (720 mg total) by mouth two (2) times a day. Adjust dose per medication card., Disp: 180 tablet, Rfl: 11  ???  pantoprazole (PROTONIX) 40 MG tablet, Take 1 tablet (40 mg total) by mouth daily., Disp: 30 tablet, Rfl: 11  ???  predniSONE (DELTASONE) 5 MG tablet, Take 1 tablet (5 mg total) by mouth daily., Disp: 30 tablet, Rfl: 11  ???  sulfamethoxazole-trimethoprim (BACTRIM) 400-80 mg per tablet, Take 1 tablet (80 mg of trimethoprim total) by mouth every Monday, Wednesday, and Friday., Disp: 12 tablet, Rfl: 5  ???  tacrolimus (ENVARSUS XR) 1 mg Tb24 extended release tablet, Take 2 tablets (2mg ) by mouth once daily in addition to 4 mg tablets for total dose of 22 mg daily., Disp: 60 tablet, Rfl: 11  ???  tacrolimus (ENVARSUS XR) 4 mg Tb24 extended release tablet, Take 5 tablets (20mg ) by mouth once daily in addition to 1 mg tablets for total dose of 22 mg daily., Disp: 150 tablet, Rfl: 11  ???  valGANciclovir (VALCYTE) 450 mg tablet, Take 2 tablets (900 mg total) by mouth daily., Disp: 60 tablet, Rfl: 1    Allergies  Iodinated contrast media, Nickel, Propranolol, Eye irrigating solution [ophthalmic irrigation solution], Iodine, Naltrexone, and Uni-cortrom    Family History  Family History   Problem Relation Age of Onset   ??? Diabetes Mother    ??? Diabetes Father    ??? Cancer Maternal Grandmother    ??? Diabetes Maternal Grandfather    ??? Diabetes Paternal Grandmother        Social History  Social History     Tobacco Use   ??? Smoking status: Never Smoker   ??? Smokeless tobacco: Never Used   Vaping Use   ??? Vaping Use: Never used   Substance Use Topics   ??? Alcohol use: Never   ??? Drug use: Never       Review of Systems  10-point review of systems completed and negative except as otherwise documented.   Constitutional: Negative for fever.  Eyes: Negative for visual changes.  ENT: Negative for rhinorrhea.  Cardiovascular: Positive for chest pain.  Respiratory: Negative for shortness of breath.  Gastrointestinal: Negative for abdominal pain. Positive for vomiting and diarrhea.  Genitourinary: Negative for dysuria.  Musculoskeletal: Negative for extremity and back pain.  Skin: Negative for rash.  Neurological: Positive for headaches. Negative focal weakness or numbness.    Physical Exam     ED Triage Vitals [07/13/19 1155]   Enc Vitals Group      BP 89/63      Pulse       SpO2 Pulse 86      Resp       Temp 36.5 ??C (97.7 ??F)      Temp Source Oral      SpO2 100 %      Weight 70.3 kg (155 lb)      Height 1.651 m (5' 5)     General: Awake and alert; reclining comfortably; no acute distress  HEENT: Atraumatic, normocephalic; no conjunctival erythema or scleral icterus; pupils equally round; patent airway; no meningismus  Cardiac: Normal rate; regular rhythm; normal S1/S2; no murmurs, rubs or gallops  Respiratory: Lungs clear to auscultation bilaterally without wheezes, rales, or rhonchi; symmetric chest movement without increased work of breathing  Abdominal: Soft, non-tender, non-distended; no rebound or guarding; no masses  MSK/Extremities: No joint/extremity tenderness, effusion, or deformity; 2+ radial pulses; no lower extremity edema; no clubbing  Skin: Warm, dry; no petechia, purpura, cyanosis, or jaundice  Neuro: Symmetric extremity movement; normal bulk and tone; symmetric face; memory intact; GCS 15  Psych: Awake, alert; follows commands; mood congruent with affect    Labs     See lab tab for full details.    EKG     EKG with normal sinus rhythm. Normal intervals and axis. No pathologic T wave inversions or ST segment elevations. No delta waves or Brugada pattern.    Radiology     XR Chest 1 view Portable   Final Result   No acute airspace disease.            Janit Pagan, MD  Resident  07/14/19 [phone]

## 2019-07-13 NOTE — Unmapped (Signed)
Pt sent an in basket message she started her envarsus 07/10/19.

## 2019-07-13 NOTE — Unmapped (Cosign Needed)
Internal Medicine (MEDL) History & Physical    Assessment & Plan:   Elaine Koch is a 34 y.o. 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 with four day history of N/V, diarrhea and headache, found to have AKI.     Principal Problem:    AKI (acute kidney injury) (CMS-HCC)  Active Problems:    History of simultaneous kidney and pancreas transplant (CMS-HCC)    Nausea & vomiting    Diarrhea  Resolved Problems:    Gastroenteritis    AKI: Patient s/p transplant on 05/03/19 with creatinine baseline of ~1.2. Upon presentation to ED, creatinine was elevated to 3.22 and patient was hypotensive (SBP 80-90s). BP improved following 2 L bolus LR. Electrolytes stable, although EKG with new peaked T waves, will recheck potassium this evening. Given history of N/V, diarrhea, and hypotension on admission,suspecting pre-renal component to AKI. Also concern for infection in the setting of immunosuppression and/or acute transplant rejection, as well as possible toxicity 2/2 tacrolimus. Renal US did not show any change from prior though was notable for some free fluid in the pelvis. Patient had recent increase in amylase that was downtrending on repeat clinic visits, other virology studies performed in clinic on 5/25 were negative. Nephrology is following, will plan to check tac trough levels, trend creatinine, follow up UA and analyze urine sediment.   - Trend creatinine   - F/u P/C Ratio, UA and amylase   - Tac Trough level   - F/u 1800 Potassium level   - Nephrology consulted, appreciate recs   - Strict I&Os   - F/u coags, consider stopping DVT ppx if biopsy likely     N/V/diarrhea - Hypotension - Lymphopenia : Patient reports four days of N/V, diarrhea, and HA prior to admission. She is afebrile. She is lymphopenic secondary to immunosuppressive therapy. Hypotensive on admission with improvement in blood pressure secondary to fluids. Was normotensive at most recent clinic visit, though had previously been on midodrine for hypotension post-transplant. Suspect she was intravascularly depleted 2/2 gastroenteritis/food-borne illness. Will purse GI infectious workup and monitor vitals.   - F/u GPP, c. Diff toxin  - Zofran PRN for nausea    - F/u CMV PCR     S/p Kidney-Pancreas transplant:  Following with [facility] transplant clinic, last seen on 5/25. Currently on Tacrolimus 9 mg TID with trough target of 8-10, MMF 720mg  and prednisone secondary to amylase bump noted on (05/27/19). Also taking 900 Valcyte for prophylaxis, CMV negative on 06/01/19. BK not detected on 07/06/19 screening.  She has been monitoring blood glucose at home and has been off of insulin compeltely most recently.   - Nephrology consulted, appreciate recs   - F/u Tac trough   - Continue MMF 720 mg daily   - Continue Tacrolimus (invarsus) 9 mg TID daily   - Continue Prednisone 5 mg daily    - Continue valacyclovir 450 mg daily   - Holding Bactrim in setting of AKI, potassium on high end of normal, will discuss restarting with transplant nephrology  - F/u CMV PCR  - Repeat amylase, lipase in AM    Daily Checklist:  Diet: Regular Diet  DVT PPx: Heparin 5000units q8h  Electrolytes: No Repletion Needed  Code Status: Full Code  Dispo: Admit to Floor     Chief Concern:   AKI (acute kidney injury) (CMS-HCC)    Subjective:   HPI:  Elaine Koch is a 34 y.o. female  with PMHx of ESRD 2/2 to DMT1 s/p KP transplant on 05/03/19 that presented to [facility] with four day history of N/V, diarrhea and headache, found to have AKI.     Patient reports acute onset of non-bloody N/V, diarrhea starting on Sunday that she attributes to food poisoning (she had gumbo at a [employer] chain several hours prior to symptoms onset). She also endorses HA that improved with tylenol. Her symptoms improved on Monday, however, this morning she noted increased blood pressure (143/110) and non exertional chest pain. She called her transplant manager and was told to present to the ED. She states she was able to take all her medications despite N/V.     Elaine Koch's most recent transplant clinic visit was on 5/25. Her tac goal is 8-10 and levels have been fairly stable. She had been on midodrine post-tranplant secondary to hypotension, which was stopped at this appointment 2/2 consistantly stable blood pressures. She was also transitioned to long acting tacrolimus (envarusu) at this appointment. BK virus tests were negative. Amylase had been elevated in April w/ subsequent initiation of prednisone 5mg  w/ downtrending values.       In the ED Elaine Koch was found to be hypotensive (SBP 80s) with improved blood pressures following 2 L bolus of LR. BMP was significant for creatinine of 3.22, up from post-transplant baseline of ~1.3. Leukopenic 2.4, although patient is on immunosuppression. Electrolytes were stable, but notable for low bicarb of 17.0. Cardiac workup secondary to reported chest pain was negative including negative troponin and unremarkable EKG. Renal US unchanged from prior aside from some free fluid noted in the abdomen.       Designated Chiropodist Maker:  Elaine Koch currently has decisional capacity for healthcare decision-making and is able to designate a surrogate healthcare decision maker. Elaine Koch designated healthcare decision maker(s) is/are Elaine Koch (the patient's parent) as denoted by stated patient preference.    Allergies:  Iodinated contrast media, Nickel, Propranolol, Eye irrigating solution [ophthalmic irrigation solution], Iodine, Naltrexone, and Uni-cortrom    Medications:   Prior to Admission medications    Medication Dose, Route, Frequency   acetaminophen (TYLENOL) 500 MG tablet Take 1-2 tablets (500-1,000 mg total) by mouth every six (6) hours as needed for pain or fever greater than 100.37F (38C).   aspirin (ECOTRIN) 81 MG tablet 81 mg, Oral, Daily (standard)   biotin 5 mg tablet 5 mg, Oral, Daily   cholecalciferol, vitamin D3-125 mcg, 5,000 unit,, 125 mcg (5,000 unit) capsule 125 mcg, Oral, Daily (standard)   gabapentin (NEURONTIN) 100 MG capsule Take 1 capsule twice daily for 7 days then 1 capsule daily for 7 days then stop   metoclopramide (REGLAN) 10 MG tablet Take 1 tablet once daily for 7 days then stop (last day 07/12/19)   multivitamin, prenatal, folic acid-iron, 27-1 mg Tab 1 tablet, Oral, Daily (standard)   mycophenolate (MYFORTIC) 180 MG EC tablet Take 4 tablets (720 mg total) by mouth two (2) times a day. Adjust dose per medication card.   pantoprazole (PROTONIX) 40 MG tablet 40 mg, Oral, Daily (standard)   predniSONE (DELTASONE) 5 MG tablet 5 mg, Oral, Daily (standard)   sulfamethoxazole-trimethoprim (BACTRIM) 400-80 mg per tablet Take 1 tablet (80 mg of trimethoprim total) by mouth every Monday, Wednesday, and Friday.   tacrolimus (ENVARSUS XR) 1 mg Tb24 extended release tablet Take 2 tablets (2mg ) by mouth once daily in addition to 4 mg tablets for total dose of 22 mg daily.   tacrolimus (ENVARSUS XR)  4 mg Tb24 extended release tablet Take 5 tablets (20mg ) by mouth once daily in addition to 1 mg tablets for total dose of 22 mg daily.   valGANciclovir (VALCYTE) 450 mg tablet 900 mg, Oral, Daily (standard)       Medical History:  Past Medical History:   Diagnosis Date   ??? Chronic hypertension during pregnancy, antepartum 07/22/2015    Overview:  Methyldopa recommended per nephrologist if needed   ??? Diabetes mellitus type 1 (CMS-HCC)    ??? ESRD (end stage renal disease) (CMS-HCC)    ??? History of pre-eclampsia 10/24/2016   ??? 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 Stage 5 chronic kidney disease on chronic dialysis (CMS-HCC) 10/24/2016       Surgical History:  Past Surgical History:   Procedure Laterality Date   ??? AV FISTULA PLACEMENT  2018   ??? CESAREAN SECTION     ??? PR TRANSPLANT ALLOGRAFT PANCREAS N/A 05/03/2019    Procedure: TRANSPLANTATION OF PANCREATIC ALLOGRAFT;  Surgeon: Leona Carry, MD;  Location: MAIN OR [facility];  Service: Transplant   ??? PR TRANSPLANT,PREP CADAVER RENAL GRAFT N/A 05/03/2019    Procedure: [facility] STD PREP CAD DONR RENAL ALLOGFT PRIOR TO TRNSPLNT, INCL DISSEC/REM PERINEPH FAT, DIAPH/RTPER ATTAC;  Surgeon: Leona Carry, MD;  Location: MAIN OR [facility];  Service: Transplant   ??? PR TRANSPLANT,PREP DONOR PANCREAS N/A 05/03/2019    Procedure: [facility] STANDARD PREPARATION OF CADAVER DONOR PANCREAS ALLOGRAFT PRIOR TO TRANSPLANTATION;  Surgeon: Leona Carry, MD;  Location: MAIN OR [facility];  Service: Transplant   ??? PR TRANSPLANTATION OF KIDNEY N/A 05/03/2019    Procedure: RENAL ALLOTRANSPLANTATION, IMPLANTATION OF GRAFT; WITHOUT RECIPIENT NEPHRECTOMY;  Surgeon: Leona Carry, MD;  Location: MAIN OR [facility];  Service: Transplant   ??? TONSILLECTOMY         Social History:  Social History     Socioeconomic History   ??? Marital status: Single     Spouse name: Not on file   ??? Number of children: 1   ??? Years of education: Not on file   ??? Highest education level: Some college, no degree   Occupational History   ??? Not on file   Tobacco Use   ??? Smoking status: Never Smoker   ??? Smokeless tobacco: Never Used   Vaping Use   ??? Vaping Use: Never used   Substance and Sexual Activity   ??? Alcohol use: Never   ??? Drug use: Never   ??? Sexual activity: Not Currently   Other Topics Concern   ??? Not on file   Social History Narrative   ??? Not on file     Social Determinants of Health     Financial Resource Strain:    ??? Difficulty of Paying Living Expenses:    Food Insecurity:    ??? Worried About Programme researcher, broadcasting/film/video in the Last Year:    ??? Barista in the Last Year:    Transportation Needs:    ??? Freight forwarder (Medical):    ??? Lack of Transportation (Non-Medical):    Physical Activity: Insufficiently Active   ??? Days of Exercise per Week: 2 days   ??? Minutes of Exercise per Session: 30 min   Stress: No Stress Concern Present   ??? Feeling of Stress : Only a little   Social Connections: Unknown   ??? Frequency of Communication with Friends and Family: Patient refused   ??? Frequency of Social Gatherings  with Friends and Family: Patient refused   ??? Attends Religious Services: Patient refused   ??? Active Member of Clubs or Organizations: Patient refused   ??? Attends Banker Meetings: Patient refused   ??? Marital Status: Patient refused        Family History:  Family History   Problem Relation Age of Onset   ??? Diabetes Mother    ??? Diabetes Father    ??? Cancer Maternal Grandmother    ??? Diabetes Maternal Grandfather    ??? Diabetes Paternal Grandmother        Review of Systems:  As per HPI     Objective:   Physical Exam:  Temp:  [36.5 ??C-37.1 ??C] 37.1 ??C  Heart Rate:  [87-88] 88  SpO2 Pulse:  [86-88] 87  Resp:  [16-19] 16  BP: (89-137)/(63-85) 133/82  SpO2:  [100 %] 100 %    Gen: Sleepy, NAD, alert, oriented, answers questions appropriately  HEENT: MMM  Heart: RRR, S1, S2, diffuse continuous bruit seconadary to right forearm fistula, no chest wall tenderness  Lungs: CTAB, no crackles or wheezes, no use of accessory muscles  Abdomen: Normoactive bowel sounds, soft, not tender to palpation   Extremities: no clubbing, cyanosis, or edema: pulses are +2 in bilateral upper and lower extremities  Skin:  No rashes, lesions on clothed exam    Labs/Studies:  Labs and Studies from the last 24hrs per EMR and Reviewed    Imaging: Radiology studies were personally reviewed    Alessandra Grout MS4    I attest that I have reviewed the student note and that the components of the history of the present illness, the physical exam, and the assessment and plan documented were performed by me or were performed in my presence by the student where I verified the documentation and performed (or re-performed) the exam and medical decision making.    Kathrin Ruddy, MD  [facility] Internal Medicine, PGY1  Pager: [phone]

## 2019-07-13 NOTE — Unmapped (Addendum)
34 yo with ESRD 2/2 T1DM s/p kidney/pancreas transplant on 05/03/19 (CMV&EBV D+/R+) p/w headaches, elevated BP, vomiting.       AKI: Patient s/p transplant on 05/03/19 with creatinine baseline of ~1.2. Upon presentation to ED, creatinine was elevated to 3.22 and patient was hypotensive (SBP 80-90s). BP improved following 2 L bolus LR. Renal US did not show any change from prior though did not some free fluid in the pelvis. Patient had recent bump in amylase that was downtrending on repeat clinic visits, other virology studies performed in clinic on 5/25 were negative. Nephrology following. Tac held on admission, trough levels elevated to 27.9. Creatinine downtrended to baseline by discharge. Tac trough improved and dosage adjusted by time of discharge.    Hyperkalemia: Potassium elevated following recheck 2/2 EKG changes. Bactrim held 6/2. Transitioned to low potassium diet, will continue trending. Will restart at discharge.  ??  N/V/diarrhea - Hypotension: Patient reports four days of N/V, diarrhea, and HA prior to admission. Afebrile with leukopenia secondary to immunosuppressive therapy. Leukopenia worsening 6/2. Hypotensive on admission with improvement in blood pressure secondary to fluids. Was normotensive at most recent clinic visit, though had previously been on midodrine for hypotension post-transplant. Urine output improved with holding tac and hydration. Making adequate urine.     S/p Kidney-Pancreas transplant:  Following with [facility] transplant clinic, last seen on 5/25. Currently on Tacrolimus 9 mg TID with trough target of 8-10, MMF 720mg  and prednisone secondary to amylase bump noted on (05/27/19). Also taking 900 Valcyte for prophylaxis, CMV negative on 06/01/19. BK not detected on 07/06/19 screening. Tac restarted.     CDiFF: Found to be Cdiff positive on admission. Treated with oral vancomycin with improvement in bowel movements. Will continue treatment for total of 10 days at discharge.     Patient had returned to her baseline by HD 3 and is being discharged to home with planned outpatient follow up.

## 2019-07-13 NOTE — Unmapped (Signed)
Kidney transplant patient presents with elevated BP and headache. Reports had several episodes of vomiting and diarrhea yesterday. States woke this morning with chest pain that went away after vomiting. Now endorses headache only.

## 2019-07-14 LAB — URINALYSIS WITH CULTURE REFLEX
BILIRUBIN UA: NEGATIVE
BLOOD UA: NEGATIVE
HYALINE CASTS: 6 /LPF — ABNORMAL HIGH (ref 0–1)
KETONES UA: NEGATIVE
LEUKOCYTE ESTERASE UA: NEGATIVE
NITRITE UA: NEGATIVE
PH UA: 5 (ref 5.0–9.0)
RBC UA: 1 /HPF (ref <=4)
SPECIFIC GRAVITY UA: 1.012 (ref 1.003–1.030)
SQUAMOUS EPITHELIAL: 6 /HPF — ABNORMAL HIGH (ref 0–5)
UROBILINOGEN UA: 0.2
WBC UA: 1 /HPF (ref 0–5)

## 2019-07-14 LAB — CBC W/ DIFFERENTIAL
BASOPHILS ABSOLUTE COUNT: 0.1 10*9/L
BASOPHILS RELATIVE PERCENT: 0.3 %
EOSINOPHILS ABSOLUTE COUNT: 0.7 10*9/L
EOSINOPHILS RELATIVE PERCENT: 1.8 %
HEMATOCRIT: 33.7 % — ABNORMAL LOW
HEMOGLOBIN: 10.6 g/dL — ABNORMAL LOW
LYMPHOCYTES ABSOLUTE COUNT: 1 10*9/L — ABNORMAL LOW
MEAN CORPUSCULAR HEMOGLOBIN CONC: 31.5 g/dL — ABNORMAL LOW
MEAN CORPUSCULAR HEMOGLOBIN: 29 pg
MEAN CORPUSCULAR VOLUME: 92.3 fL
MEAN PLATELET VOLUME: 10.2 fL
MONOCYTES ABSOLUTE COUNT: 1.2 10*9/L — ABNORMAL LOW
MONOCYTES RELATIVE PERCENT: 3.1 %
NEUTROPHILS ABSOLUTE COUNT: 3.5 10*9/L
PLATELET COUNT: 266 10*9/L
RED BLOOD CELL COUNT: 3.65 10*12/L — ABNORMAL LOW
RED CELL DISTRIBUTION WIDTH: 15.7 % — ABNORMAL HIGH
WHITE BLOOD CELL COUNT: 3.8 10*9/L

## 2019-07-14 LAB — CBC W/ AUTO DIFF
BASOPHILS ABSOLUTE COUNT: 0 10*9/L (ref 0.0–0.1)
BASOPHILS RELATIVE PERCENT: 0.8 %
EOSINOPHILS RELATIVE PERCENT: 3.8 %
HEMATOCRIT: 33.2 % — ABNORMAL LOW (ref 36.0–46.0)
HEMOGLOBIN: 10.6 g/dL — ABNORMAL LOW (ref 12.0–16.0)
LARGE UNSTAINED CELLS: 2 % (ref 0–4)
LYMPHOCYTES ABSOLUTE COUNT: 0.2 10*9/L — ABNORMAL LOW (ref 1.5–5.0)
LYMPHOCYTES RELATIVE PERCENT: 9.6 %
MEAN CORPUSCULAR HEMOGLOBIN CONC: 32 g/dL (ref 31.0–37.0)
MEAN CORPUSCULAR HEMOGLOBIN: 30.2 pg (ref 26.0–34.0)
MEAN CORPUSCULAR VOLUME: 94.3 fL (ref 80.0–100.0)
MEAN PLATELET VOLUME: 8.5 fL (ref 7.0–10.0)
MONOCYTES ABSOLUTE COUNT: 0.1 10*9/L — ABNORMAL LOW (ref 0.2–0.8)
MONOCYTES RELATIVE PERCENT: 5.9 %
NEUTROPHILS ABSOLUTE COUNT: 1.2 10*9/L — ABNORMAL LOW (ref 2.0–7.5)
NEUTROPHILS RELATIVE PERCENT: 77.8 %
PLATELET COUNT: 262 10*9/L (ref 150–440)
RED BLOOD CELL COUNT: 3.52 10*12/L — ABNORMAL LOW (ref 4.00–5.20)
RED CELL DISTRIBUTION WIDTH: 16.5 % — ABNORMAL HIGH (ref 12.0–15.0)
WBC ADJUSTED: 1.6 10*9/L — ABNORMAL LOW (ref 4.5–11.0)

## 2019-07-14 LAB — COMPREHENSIVE METABOLIC PANEL
ALBUMIN: 3.7 g/dL (ref 3.5–5.0)
ALKALINE PHOSPHATASE: 71 U/L (ref 38–126)
ALT (SGPT): 19 U/L (ref <35)
ANION GAP: 11 mmol/L (ref 7–15)
AST (SGOT): 23 U/L (ref 14–38)
BILIRUBIN TOTAL: 0.8 mg/dL (ref 0.0–1.2)
BLOOD UREA NITROGEN: 46 mg/dL — ABNORMAL HIGH (ref 7–21)
BUN / CREAT RATIO: 14
CALCIUM: 9.4 mg/dL (ref 8.5–10.2)
CHLORIDE: 114 mmol/L — ABNORMAL HIGH (ref 98–107)
CO2: 15 mmol/L — ABNORMAL LOW (ref 22.0–30.0)
CREATININE: 3.4 mg/dL — ABNORMAL HIGH (ref 0.60–1.00)
EGFR CKD-EPI AA FEMALE: 20 mL/min/{1.73_m2} — ABNORMAL LOW (ref >=60)
EGFR CKD-EPI NON-AA FEMALE: 17 mL/min/{1.73_m2} — ABNORMAL LOW (ref >=60)
GLUCOSE RANDOM: 84 mg/dL (ref 70–179)
POTASSIUM: 5.1 mmol/L — ABNORMAL HIGH (ref 3.5–5.0)
PROTEIN TOTAL: 6.9 g/dL (ref 6.5–8.3)

## 2019-07-14 LAB — INR: Coagulation tissue factor induced.INR:RelTime:Pt:PPP:Qn:Coag: 1.05

## 2019-07-14 LAB — PROTEIN / CREATININE RATIO, URINE
CREATININE, URINE: 190.4 mg/dL
PROTEIN/CREAT RATIO, URINE: 0.125

## 2019-07-14 LAB — MAGNESIUM
Lab: 1.5
MAGNESIUM: 1.5 mg/dL — ABNORMAL LOW (ref 1.6–2.2)
Magnesium:MCnc:Pt:Ser/Plas:Qn:: 1.5 — ABNORMAL LOW

## 2019-07-14 LAB — APTT: HEPARIN CORRELATION: 0.2

## 2019-07-14 LAB — TACROLIMUS, TROUGH
Lab: 27.9
Lab: 8.2

## 2019-07-14 LAB — CLARITY

## 2019-07-14 LAB — BASIC METABOLIC PANEL
BLOOD UREA NITROGEN: 28 mg/dL — ABNORMAL HIGH
CALCIUM: 9.3 mg/dL
CHLORIDE: 114 mmol/L — ABNORMAL HIGH
CO2: 18 mmol/L — ABNORMAL LOW
GLUCOSE RANDOM: 90 mg/dL
POTASSIUM: 4.7 mmol/L
SODIUM: 139 mmol/L

## 2019-07-14 LAB — AMYLASE
Amylase:CCnc:Pt:Ser/Plas:Qn:: 150 — ABNORMAL HIGH
Lab: 130 — ABNORMAL HIGH

## 2019-07-14 LAB — PREGNANCY TEST URINE: Choriogonadotropin (pregnancy test):PrThr:Pt:Urine:Ord:: NEGATIVE

## 2019-07-14 LAB — POTASSIUM
Potassium:SCnc:Pt:Ser/Plas:Qn:: 4.8
Potassium:SCnc:Pt:Ser/Plas:Qn:: 4.9

## 2019-07-14 LAB — CMV COMMENT: Lab: 0

## 2019-07-14 LAB — LIPASE
Lab: 24
Triacylglycerol lipase:CCnc:Pt:Ser/Plas:Qn:: 106

## 2019-07-14 LAB — CMV DNA, QUANTITATIVE, PCR

## 2019-07-14 LAB — MEAN CORPUSCULAR HEMOGLOBIN CONC: Lab: 31.5 — ABNORMAL LOW

## 2019-07-14 LAB — SMEAR REVIEW

## 2019-07-14 LAB — PHOSPHORUS
Lab: 3.1
Phosphate:MCnc:Pt:Ser/Plas:Qn:: 3.8

## 2019-07-14 LAB — HEPARIN CORRELATION: Lab: 0.2

## 2019-07-14 LAB — EGFR CKD-EPI NON-AA MALE: Lab: 0

## 2019-07-14 LAB — ALBUMIN: Lab: 3.9

## 2019-07-14 LAB — ANISOCYTOSIS

## 2019-07-14 LAB — HEPATIC FUNCTION PANEL
ALKALINE PHOSPHATASE: 64 U/L
AST (SGOT): 20 U/L
PROTEIN TOTAL: 6.5 g/dL

## 2019-07-14 LAB — PROTEIN URINE: Protein:MCnc:Pt:Urine:Qn:: 23.8

## 2019-07-14 LAB — CHLORIDE: Chloride:SCnc:Pt:Ser/Plas:Qn:: 114 — ABNORMAL HIGH

## 2019-07-14 LAB — AST (SGOT): Lab: 20

## 2019-07-14 LAB — SODIUM URINE: Lab: 27

## 2019-07-14 NOTE — Unmapped (Addendum)
Aox4, VSS, afebrile, & free of falls/injuries during shift. No c/o pain. K 5.6 on 1839 labs- MD notified, EKG completed, & repeat K 4.8. Urine samples collected. Pt slept well throughout night. No further concerns at this time.     Problem: Infection  Goal: Infection Symptom Resolution  Outcome: Ongoing - Unchanged     Problem: Adult Inpatient Plan of Care  Goal: Plan of Care Review  Outcome: Ongoing - Unchanged  Goal: Patient-Specific Goal (Individualization)  Outcome: Ongoing - Unchanged  Goal: Absence of Hospital-Acquired Illness or Injury  Outcome: Ongoing - Unchanged  Goal: Optimal Comfort and Wellbeing  Outcome: Ongoing - Unchanged  Goal: Readiness for Transition of Care  Outcome: Ongoing - Unchanged  Goal: Rounds/Family Conference  Outcome: Ongoing - Unchanged     Problem: Self-Care Deficit  Goal: Improved Ability to Complete Activities of Daily Living  Outcome: Ongoing - Unchanged     Problem: Wound  Goal: Optimal Wound Healing  Outcome: Ongoing - Unchanged

## 2019-07-14 NOTE — Unmapped (Signed)
VSS. Admitted from ED this evening. Mother at [facility]. Oriented to room. Call bell w/in reach. Encouraged to call for needs. Monitoring  Problem: Infection  Goal: Infection Symptom Resolution  Outcome: Ongoing - Unchanged     Problem: Adult Inpatient Plan of Care  Goal: Plan of Care Review  Outcome: Ongoing - Unchanged  Goal: Patient-Specific Goal (Individualization)  Outcome: Ongoing - Unchanged  Goal: Absence of Hospital-Acquired Illness or Injury  Outcome: Ongoing - Unchanged  Goal: Optimal Comfort and Wellbeing  Outcome: Ongoing - Unchanged  Goal: Readiness for Transition of Care  Outcome: Ongoing - Unchanged  Goal: Rounds/Family Conference  Outcome: Ongoing - Unchanged     Problem: Self-Care Deficit  Goal: Improved Ability to Complete Activities of Daily Living  Outcome: Ongoing - Unchanged

## 2019-07-14 NOTE — Unmapped (Signed)
Tacrolimus Therapeutic Monitoring Pharmacy Note    Elaine Koch is a 34 y.o. female continuing tacrolimus.     Indication: Kidney/Pancrease     Date of Transplant: March 2021      Prior Dosing Information: Home regimen ENVARSUS 22mg  Daily  (uses 4mg  + 1mg  Tabs)    Goals:  Therapeutic Drug Levels  Tacrolimus trough goal: 8-10 ng/mL    Additional Clinical Monitoring/Outcomes  ?? Monitor renal function (SCr and urine output) and liver function (LFTs)  ?? Monitor for signs/symptoms of adverse events (e.g., hyperglycemia, hyperkalemia, hypomagnesemia, hypertension, headache, tremor)    Results:   Tacrolimus level: SEE TABLE BELOW    Pharmacokinetic Considerations and Significant Drug Interactions:  ??? Concurrent hepatotoxic medications: none at time of this note  ??? Concurrent CYP3A4 substrates/inhibitors: none at time of this note  ??? Concurrent nephrotoxic medications: Bactrim (SS) MWF  (standard med)    Assessment/Plan:  ?? 6/2 tacro level at 0631 (27.9ng/mL) is significantly elevated (Last dose 6/1, exact time unknown by this provider)  ?? Prior levels (for month of May 2021 appear to be within goal - per epic)  ?? Patient has had few days of diarrhea prior to admission  ?? 6/2 bun/scr > incr since adm on 6/1  (Baseline ?? 1.3-1.8)    Recommendation(s)  ??? HOLD further dosing of tacrolimus   ??? Recheck level on 6/3  ??? Re-evaluate if bactrim (PJP) prophy should be continued if Scr continues to increase versus using inhaled pentamidine or atovaquone  (next dose due 6/4 -Friday)    Follow-up  ??? Daily till Scr/levels have stablized.   ??? A pharmacist will continue to monitor and recommend levels as appropriate    Longitudinal Dose Monitoring:  Date Dose (mg), Route AM Scr (mg/dL) Level  (ng/mL) Key Drug Interactions                 6/2 HOLD 3.4 27.9ng/mL at 0631    6/1 ENVARSUS 22mg  3.1       Please page service pharmacist with questions/clarifications.    Drue Flirt, PharmD   July 14, 2019 4:57 PM

## 2019-07-14 NOTE — Unmapped (Signed)
TRANSPLANT SURGERY PROGRESS NOTE     Assessment:  Elaine Koch is a 34 y.o. 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     Plan:    -Reg diet  -IVF  -Zofran for nausea  -Tac trough 27, will hold tac at this time  -Immunosuppression: myfortic 720 BID, prednisone 5mg , holding Tac  -ID: Cdiff pos: oral vanc, bactrim ppx, Valcyte ppx  -Home meds: PPI, vit d  -Dispo: Pending      LDA: PIV     Subjective/Interval:  Transferred to transplant surgery.     Objective:  Vitals:    07/14/19 1304   BP: 101/60   Pulse: 87   Resp: 16   Temp: 36.6 ??C   SpO2: 100%             Vicente Males, MD  General Surgery - PGY 1  Pager: [phone]

## 2019-07-14 NOTE — Unmapped (Signed)
Internal Medicine (MEDL) Progress Note    Assessment & Plan:   Elaine Koch is a 34 y.o. female with a PMHx of ESRD 2/2 DMT1 s/p KP transplant on 05/03/19 that presented to [facility] with four day history of N/V, diarrhea and headache, found to have AKI.     Principal Problem:    AKI (acute kidney injury) (CMS-HCC)  Active Problems:    History of simultaneous kidney and pancreas transplant (CMS-HCC)    Nausea & vomiting    Diarrhea  Resolved Problems:    Gastroenteritis    AKI: Patient s/p transplant on 05/03/19 w/ creatinine baseline of ~1.2. Upon presentation to the ED, creatinine elevated to 3.22 and patient hypotensive. Blood pressures responsive to 2 L LR. Creatinine did not show improvement following ED fluid resuscitation and increased to 3.4 on 6/2, therefore, likely pre-renal etiology. Patient states continued poor PO intake and low UOP. Possibly ATN in the setting of prolonged pre-renal injury. UA was unremarkable with normal UPC. Nephrology following and will obtain urine sediment this morning. Concern for infection in setting of immunosuppression, however, patient states resolution of her N/V, diarrhea (GPP, c diff pending).  Finally, AKI could be secondary to acute transplant rejection or tacrolimus toxicity. Held tacrolimus this AM while tac trough level pending. Will continue to follow up with nephrology and monitor UOP/pending labs.   - Trend creatinine   - Tac Trough level   - Nephrology consulted, appreciate recs   - Strict I&Os     Hyperkalemia: Patient w/ potassium of 5.0 on ED presentation. EKG w/ changes c/f hyperkalemia. Recheck at 1800 with K of 5.6, back down to 4.8 at 2400. This morning K of 5.1. Decision made to hold Bactrim. Will change to low K diet and recheck K this afternoon.   - Continue trending K     N/V/diarrhea - Hypotension - Lymphopenia : Patient reports four days of N/V, diarrhea, and HA prior to admission. She is lymphopenic secondary to immunosuppressive therapy, decreases in her WBC from 2.4 on admission to 1.6 this AM. Hypotensive on admission with improvement in blood pressure secondary to fluids. Was normotensive at most recent clinic visit, though had previously been on midodrine for hypotension post-transplant. Suspect she was intravascularly depleted 2/2 gastroenteritis/food-borne illness. Will give additional 1 L LR over 5 hrs this morning given soft Bps this morning.   - 1 L bolus LR over 5 hours   - F/u GPP, c. Diff toxin  - Zofran PRN for nausea    - F/u CMV PCR    S/p Kidney-Pancreas transplant:  Following with [facility] transplant clinic, last seen on 5/25. Currently on Tacrolimus 9 mg TID with trough target of 8-10, MMF 720mg  and prednisone secondary to amylase bump noted on (05/27/19). Also taking 900 Valcyte for prophylaxis, CMV negative on 06/01/19. BK not detected on 07/06/19 screening.  She has been monitoring blood glucose at home and has been off of insulin compeltely most recently.   - Nephrology consulted, appreciate recs   - F/u Tac trough   - Hold Tacrolimus (invarsus) 9 mg TID daily until trough result   - Continue MMF 720 mg daily   - Continue Prednisone 5 mg daily    - Continue valacyclovir 450 mg daily   - Holding Bactrim in setting of AKI, potassium on high end of normal, will discuss restarting with transplant nephrology  - F/u CMV PCR  - Repeat amylase, lipase in AM    Daily Checklist:  Diet: Low  Potassium Diet   DVT PPx: Heparin 5000units q8h  Electrolytes: No Repletion Needed  Code Status: Full Code  Dispo: Transfer to Transplant surgery     Subjective:   No acute events overnight. Patient reports no additional N/V or diarrhea but continued HA. States lower UOP than normal, with lower PO intake.     Objective:   Temp:  [36.7 ??C (98.1 ??F)-37.1 ??C (98.8 ??F)] 36.9 ??C (98.4 ??F)  Heart Rate:  [85-96] 85  SpO2 Pulse:  [86-88] 87  Resp:  [16-19] 16  BP: (95-137)/(57-85) 95/57  SpO2:  [96 %-100 %] 96 %    Gen: WDWN in NAD, alert, oriented, answers questions appropriately  HEENT: atraumatic, sclera anicteric, MMM. OP w/o erythema or exudate   Heart: RRR, S1, S2, diffuse continuous bruit seconadary to right forearm fistula, no chest wall tenderness  Lungs: CTAB, no crackles or wheezes, no use of accessory muscles  Abdomen: Normoactive bowel sounds, soft, NTND, no rebound/guarding  Extremities: no clubbing, cyanosis, or edema  Psych: Appropriate mood and affect    Labs/Studies: Labs and Studies from the last 24hrs per EMR and Reviewed       Written by Alessandra Grout MS4    I attest that I have reviewed the student note and that the components of the history of the present illness, the physical exam, and the assessment and plan documented were performed by me or were performed in my presence by the student where I verified the documentation and performed (or re-performed) the exam and medical decision making.    Kathrin Ruddy, MD  [facility] Internal Medicine, PGY1  Pager: [phone]

## 2019-07-14 NOTE — Unmapped (Signed)
Episode Detail Report 05/12/19 - 5/29/21sent to HIM Joice Lofts D Powell July 14, 2019 10:40 AM

## 2019-07-14 NOTE — Unmapped (Signed)
Social Work  Psychosocial Assessment    Patient Name: Elaine Koch   Medical Record Number: [id number]   Date of Birth: [birth year]  Sex: Female     Referral  Referred by: Care Manager  Reason for Referral: Complex Discharge Planning  No Psychosocial Interventions Necessary: No Psychosocial Interventions Necessary    Extended Emergency Contact Information  Primary Emergency Contact: Hicks,Dorothy   [place]  Home Phone: [phone]  Relation: None  Preferred language: ENGLISH  Interpreter needed? No    Legal Next of Kin / Guardian / POA / Advance Directives    HCDM (patient stated preference): Hicks,Dorothy - [phone]    Advance Directive (Medical Treatment)  Does patient have an advance directive covering medical treatment?: Patient does not have advance directive covering medical treatment.    Health Care Decision Maker [HCDM] (Medical & Mental Health Treatment)  Healthcare Decision Maker: HCDM documented in the HCDM/Contact Info section.  Information offered on HCDM, Medical & Mental Health advance directives:: Patient given information.         Discharge Planning  Discharge Planning Information:   Type of Residence   Mailing Address:  [address]  [place] [address]    Medical Information   Past Medical History:   Diagnosis Date   ??? Chronic hypertension during pregnancy, antepartum 07/22/2015    Overview:  Methyldopa recommended per nephrologist if needed   ??? Diabetes mellitus type 1 (CMS-HCC)    ??? ESRD (end stage renal disease) (CMS-HCC)    ??? History of pre-eclampsia 10/24/2016   ??? 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 AV FISTULA PLACEMENT  2018   ??? CESAREAN SECTION     ??? PR TRANSPLANT ALLOGRAFT PANCREAS N/A 05/03/2019    Procedure: TRANSPLANTATION OF PANCREATIC ALLOGRAFT;  Surgeon: Leona Carry, MD;  Location: MAIN OR [facility];  Service: Transplant   ??? PR TRANSPLANT,PREP CADAVER RENAL GRAFT N/A 05/03/2019    Procedure: [facility] STD PREP CAD DONR RENAL ALLOGFT PRIOR TO TRNSPLNT, INCL DISSEC/REM PERINEPH FAT, DIAPH/RTPER ATTAC;  Surgeon: Leona Carry, MD;  Location: MAIN OR [facility];  Service: Transplant   ??? PR TRANSPLANT,PREP DONOR PANCREAS N/A 05/03/2019    Procedure: [facility] STANDARD PREPARATION OF CADAVER DONOR PANCREAS ALLOGRAFT PRIOR TO TRANSPLANTATION;  Surgeon: Leona Carry, MD;  Location: MAIN OR [facility];  Service: Transplant   ??? PR TRANSPLANTATION OF KIDNEY N/A 05/03/2019    Procedure: RENAL ALLOTRANSPLANTATION, IMPLANTATION OF GRAFT; WITHOUT RECIPIENT NEPHRECTOMY;  Surgeon: Leona Carry, MD;  Location: MAIN OR [facility];  Service: Transplant   ??? TONSILLECTOMY         Family History   Problem Relation Age of Onset   ??? Diabetes Mother    ??? Diabetes Father    ??? Cancer Maternal Grandmother    ??? Diabetes Maternal Grandfather    ??? Diabetes Paternal Location manager Insurance: Payor: MEDICARE / Plan: MEDICARE PART A AND PART B / Product Type: *No Product type* /    Secondary Insurance: Secondary Insurance  MEDICAID [place]   Prescription Coverage: Medicare D     Preferred Pharmacy: [facility]    Barriers to taking medication: No    Transition Home   Transportation at time of discharge:  Family/Friend's Private Vehicle    Anticipated changes related to Illness: TBD   Services in place prior to admission: Home Based Services: [facility] RN w/ [employer] anticipated for DC: resumption of prior [facility] services   Hemodialysis Prior to Admission: No    Readmission  Risk of Unplanned Readmission Score: UNPLANNED READMISSION SCORE: 16%  Readmitted Within the Last 30 Days?   Readmission Factors include: other: new txp    Social Determinants of Health  Social Determinants of Health were addressed in provider documentation.  Please refer to patient history.    Social History  Support Systems/Concerns: Case Animal nutritionist: No Field seismologist Affecting Healthcare: none    Medical and Psychiatric History  Psychosocial Stressors: Denies      Psychological Issues/Information: No issues              Chemical Dependency: None              Outpatient Providers: Specialist   Name / Contact #: Counselling psychologist: No legal issues      Ability to [employer]: No issues accessing community services      **  CM met with patient in pt room.  Pt/visitors were not wearing hospital provided masks for the duration of the interaction with CM.     CM was wearing hospital provided surgical mask and hospital provided eye protection.  CM was not within 6 foot of the patient/visitors during this interaction.     Met w/ pt at bedside.  Pt denies any questions/concerns at this time.  Validated all demographic information.  Pt continues to live w/ mother and 3yo son/Logan.  Mom will provide transport at DC.  Pt has been DC from [facility] PT, but has continued to get [facility] RN for labs ([facility]).  Will enter orders to resume needs.    Will continue to follow as needed.    Lowella Petties, LCSW, CCTSW

## 2019-07-14 NOTE — Unmapped (Signed)
Transplant Nephrology Consult     Requesting provider: Lawernce Keas, MD  Service requesting consult: MEDL  Reason for consult: aki, renal transplant      Assessment/Recommendations: Elaine Koch is a/an 34 y.o. 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    Status post simultaneous pancreas/kidney transplant, Allograft Function (worsened):   -Serum creatinine level is 3.4 today (no improvement as compared to yesterday).   -baseline Cr seems to be around 1.1-1.3  -suspecting AKI is secondary to CNA toxicity given supratherapeutic levels (likely worsened in the setting of diarrhea) and GI losses/prerenal injury.  See below for tacrolimus plan  -Did discuss the possibility of kidney biopsy with patient which she is agreeable to (and consented to) we will hold off at this junction given her supratherapeutic tacrolimus levels. If no significant improvement in renal function despite tacrolimus levels improving/improved, then will revisit a potential plan to do a biopsy  -urine sediment examination pending (sample not available)  -would recommend some IVF today (1L of LR)  -hold bactrim for now  -dose meds to gfr  -avoid nephrotoxic agents including but not limited to NSAIDs, contrast, etc    Immunosuppression [High risk medical decision making for drug therapy requiring intensive monitoring for toxicity]  -hold envarsus 22mg  daily (home regimen), recently switched from prograf  -continue with prednisone 5mg  daily  -continue with myfortic 720mg  BID  -Please obtain trough tacrolimus trough levels prior to the morning dose of the medication. The tacrolimus goal level is 8-10 ng/mL. Check levels daily in the setting of supratherapeutic levels    Infectious Prophylaxis and Monitoring:   -valcyte, hold bactrim as above    NAGMA  -likely related to diarrhea and CNI/RTA, recommend starting sodium bicarbonate 1300mg  tid    Nausea/vomiting, diarrhea  -check CMV  -continue with myfortic for now given resolution of symptoms    Seen and evaluated by Dr. Nestor Lewandowsky.  Thank you for this consult, will follow along with primary service. Please page on-call nephrology fellow with any questions/concerns.  Recommendations conveyed to primary service.     Anthony Sar  Division of Nephrology and Hypertension  [facility]  07/14/2019  7:26 AM      _____________________________________________________________________________________        Transplant Background  Transplant Date: 05/03/2019  Induction therapy: campath  KDPI: 34%  HLA mismatch: no  Serologies: CMV D+/R+, EBV D+/R+  Dialysis Vintage: Prior to transplant the patient was on dialysis since 2018.   Current maintenance immunosuppression: envarsus, myfortic, prednisone  Rejection episodes: none      History of Present Illness: Elaine Koch is a/an 34 y.o. female  status post simultaneous kidney/pancreatic transplant on 05/03/2019 (native kidney disease secondary to DM), diabetes mellitus type 1 who has presented to [facility] with nausea/vomiting, diarrhea, and headache.  Found to have AKI.  Was recently switched from Prograf to endorses.  She reports her blood pressure at home was elevated and having nonexertional chest pain therefore she contacted her transplant coordinator who directed her to the ER.  She has a history of hypertension post transplant requiring midodrine however that has been stopped given stable blood pressures.  She did have been elevated amylase back in April therefore was initiated on prednisone 5 mg daily.    In the ER patient was found to be hypotensive to systolic in the 80s which was fluid  responsive (received 2 L of LR).  Her previous creatinine had been around 1.3 now with a creatinine of up to 3.2 this time around.  Currently patient feels much better as much after receiving IV fluids.  She now has an appetite.  Nausea/vomiting/headaches have improved.  She did have 1 watery bowel movement today.  Of note she has been dealing with slight tremors for a while now.  Denies fevers, chest pain, shortness of breath, dysuria, hematuria, pain over graft site.          Medications:   Current Facility-Administered Medications   Medication Dose Route Frequency Provider Last Rate Last Admin   ??? acetaminophen (TYLENOL) tablet 650 mg  650 mg Oral Q6H PRN Kathrin Ruddy, MD       ??? cholecalciferol (vitamin D3-125 mcg (5,000 unit)) tablet 125 mcg  125 mcg Oral Daily Heron Sabins, MD       ??? multivitamin, prenatal (folic acid-iron) tablet 1 tablet  1 tablet Oral Daily Heron Sabins, MD       ??? mycophenolate (MYFORTIC) EC tablet 720 mg  720 mg Oral BID Heron Sabins, MD   720 mg at 07/13/19 2006   ??? ondansetron (ZOFRAN-ODT) disintegrating tablet 4 mg  4 mg Oral Q8H PRN Kathrin Ruddy, MD       ??? pantoprazole (PROTONIX) EC tablet 40 mg  40 mg Oral Daily Heron Sabins, MD       ??? predniSONE (DELTASONE) tablet 5 mg  5 mg Oral Daily Heron Sabins, MD       ??? tacrolimus (ENVARSUS XR) extended release tablet 22 mg  22 mg Oral Daily Heron Sabins, MD       ??? valGANciclovir (VALCYTE) tablet 900 mg  900 mg Oral Daily Heron Sabins, MD            ALLERGIES  Iodinated contrast media, Nickel, Propranolol, Eye irrigating solution [ophthalmic irrigation solution], Iodine, Naltrexone, and Uni-cortrom    MEDICAL HISTORY  Past Medical History:   Diagnosis Date   ??? Chronic hypertension during pregnancy, antepartum 07/22/2015    Overview:  Methyldopa recommended per nephrologist if needed   ??? Diabetes mellitus type 1 (CMS-HCC)    ??? ESRD (end stage renal disease) (CMS-HCC)    ??? History of pre-eclampsia 10/24/2016   ??? 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 Stage 5 chronic kidney disease on chronic dialysis (CMS-HCC) 10/24/2016        SOCIAL HISTORY  Social History     Socioeconomic History   ??? Marital status: Single     Spouse name: Not on file   ??? Number of children: 1   ??? Years of education: Not on file   ??? Highest education level: Some college, no degree   Occupational History   ??? Not on file   Tobacco Use   ??? Smoking status: Never Smoker   ??? Smokeless tobacco: Never Used   Vaping Use   ??? Vaping Use: Never used   Substance and Sexual Activity   ??? Alcohol use: Never   ??? Drug use: Never   ??? Sexual activity: Not Currently   Other Topics Concern   ??? Not on file   Social History Narrative   ??? Not on file     Social Determinants of Health     Financial Resource Strain:    ??? Difficulty of Paying Living Expenses:    Food Insecurity:    ???  Worried About Programme researcher, broadcasting/film/video in the Last Year:    ??? Barista in the Last Year:    Transportation Needs:    ??? Freight forwarder (Medical):    ??? Lack of Transportation (Non-Medical):    Physical Activity: Insufficiently Active   ??? Days of Exercise per Week: 2 days   ??? Minutes of Exercise per Session: 30 min   Stress: No Stress Concern Present   ??? Feeling of Stress : Only a little   Social Connections: Unknown   ??? Frequency of Communication with Friends and Family: Patient refused   ??? Frequency of Social Gatherings with Friends and Family: Patient refused   ??? Attends Religious Services: Patient refused   ??? Active Member of Clubs or Organizations: Patient refused   ??? Attends Banker Meetings: Patient refused   ??? Marital Status: Patient refused        FAMILY HISTORY  Family History   Problem Relation Age of Onset   ??? Diabetes Mother    ??? Diabetes Father    ??? Cancer Maternal Grandmother    ??? Diabetes Maternal Grandfather    ??? Diabetes Paternal Grandmother         Review of Systems:  A 12 system review of systems was negative except as noted in HPI.  Otherwise as per HPI, all other systems reviewed and negative    Physical Exam:  Vitals:    07/14/19 0355   BP: 95/57   Pulse: 85   Resp: 16   Temp: 36.9 ??C   SpO2: 96%     No intake/output data recorded. Intake/Output Summary (Last 24 hours) at 07/14/2019 0726  Last data filed at 07/13/2019 1606  Gross per 24 hour   Intake 2000 ml   Output ???   Net 2000 ml       General: well-appearing, no acute distress  HEENT: anicteric sclera, oropharynx clear without lesions  CV: regular rate, normal rhythm, no murmurs, no gallops, no rubs, no peripheral edema  Lungs: clear to auscultation bilaterally, normal work of breathing  Abdomen: soft, non-tender, non-distended, graft site nontender  Skin: no visible lesions or rashes  Psych: alert, engaged, appropriate mood and affect  Musculoskeletal: no obvious deformities  Neuro: normal speech, no gross focal deficits, slight tremors    Test Results  Reviewed  Lab Results   Component Value Date    NA 140 07/13/2019    K 4.8 07/13/2019    CL 111 (H) 07/13/2019    CO2 17.0 (L) 07/13/2019    BUN 43 (H) 07/13/2019    CREATININE 3.22 (H) 07/13/2019    GLU 99 07/13/2019    CALCIUM 9.7 07/13/2019    ALBUMIN 4.5 07/13/2019    PHOS 3.1 07/01/2019           I have reviewed all relevant outside healthcare records related to the patient's kidney injury.

## 2019-07-15 DIAGNOSIS — Z940 Kidney transplant status: Principal | ICD-10-CM

## 2019-07-15 DIAGNOSIS — Z9483 Pancreas transplant status: Principal | ICD-10-CM

## 2019-07-15 LAB — CBC W/ AUTO DIFF
BASOPHILS ABSOLUTE COUNT: 0 10*9/L (ref 0.0–0.1)
BASOPHILS RELATIVE PERCENT: 0.8 %
EOSINOPHILS ABSOLUTE COUNT: 0.1 10*9/L (ref 0.0–0.4)
HEMATOCRIT: 30.3 % — ABNORMAL LOW (ref 36.0–46.0)
HEMOGLOBIN: 10.2 g/dL — ABNORMAL LOW (ref 12.0–16.0)
LARGE UNSTAINED CELLS: 4 % (ref 0–4)
LYMPHOCYTES ABSOLUTE COUNT: 0.1 10*9/L — ABNORMAL LOW (ref 1.5–5.0)
MEAN CORPUSCULAR HEMOGLOBIN CONC: 33.7 g/dL (ref 31.0–37.0)
MEAN CORPUSCULAR HEMOGLOBIN: 31.8 pg (ref 26.0–34.0)
MEAN CORPUSCULAR VOLUME: 94.3 fL (ref 80.0–100.0)
MEAN PLATELET VOLUME: 8.4 fL (ref 7.0–10.0)
MONOCYTES ABSOLUTE COUNT: 0.1 10*9/L — ABNORMAL LOW (ref 0.2–0.8)
MONOCYTES RELATIVE PERCENT: 12.4 %
NEUTROPHILS ABSOLUTE COUNT: 0.8 10*9/L — ABNORMAL LOW (ref 2.0–7.5)
NEUTROPHILS RELATIVE PERCENT: 67.7 %
PLATELET COUNT: 277 10*9/L (ref 150–440)
RED BLOOD CELL COUNT: 3.21 10*12/L — ABNORMAL LOW (ref 4.00–5.20)
RED CELL DISTRIBUTION WIDTH: 16.7 % — ABNORMAL HIGH (ref 12.0–15.0)
WBC ADJUSTED: 1.1 10*9/L — ABNORMAL LOW (ref 4.5–11.0)

## 2019-07-15 LAB — TACROLIMUS, TROUGH: Lab: 8.3

## 2019-07-15 LAB — PHOSPHORUS: Phosphate:MCnc:Pt:Ser/Plas:Qn:: 3

## 2019-07-15 LAB — COMPREHENSIVE METABOLIC PANEL
ALKALINE PHOSPHATASE: 66 U/L (ref 38–126)
ALT (SGPT): 17 U/L (ref <35)
ANION GAP: 5 mmol/L — ABNORMAL LOW (ref 7–15)
AST (SGOT): 21 U/L (ref 14–38)
BILIRUBIN TOTAL: 0.5 mg/dL (ref 0.0–1.2)
BLOOD UREA NITROGEN: 38 mg/dL — ABNORMAL HIGH (ref 7–21)
BUN / CREAT RATIO: 19
CALCIUM: 8.9 mg/dL (ref 8.5–10.2)
CHLORIDE: 116 mmol/L — ABNORMAL HIGH (ref 98–107)
CO2: 18 mmol/L — ABNORMAL LOW (ref 22.0–30.0)
CREATININE: 1.96 mg/dL — ABNORMAL HIGH (ref 0.60–1.00)
EGFR CKD-EPI AA FEMALE: 38 mL/min/{1.73_m2} — ABNORMAL LOW (ref >=60)
EGFR CKD-EPI NON-AA FEMALE: 33 mL/min/{1.73_m2} — ABNORMAL LOW (ref >=60)
GLUCOSE RANDOM: 97 mg/dL (ref 70–179)
POTASSIUM: 4.7 mmol/L (ref 3.5–5.0)
PROTEIN TOTAL: 6 g/dL — ABNORMAL LOW (ref 6.5–8.3)
SODIUM: 139 mmol/L (ref 135–145)

## 2019-07-15 LAB — AMYLASE: Amylase:CCnc:Pt:Ser/Plas:Qn:: 138 — ABNORMAL HIGH

## 2019-07-15 LAB — MAGNESIUM: Magnesium:MCnc:Pt:Ser/Plas:Qn:: 1.9

## 2019-07-15 LAB — HEMOGLOBIN: Hemoglobin:MCnc:Pt:Bld:Qn:: 10.2 — ABNORMAL LOW

## 2019-07-15 LAB — LIPASE: Triacylglycerol lipase:CCnc:Pt:Ser/Plas:Qn:: 109

## 2019-07-15 LAB — BLOOD UREA NITROGEN: Urea nitrogen:MCnc:Pt:Ser/Plas:Qn:: 38 — ABNORMAL HIGH

## 2019-07-15 MED ADMIN — mycophenolate (MYFORTIC) EC tablet 720 mg: 720 mg | ORAL | @ 14:00:00 | Stop: 2019-07-15

## 2019-07-15 MED ADMIN — multivitamin, prenatal (folic acid-iron) tablet 1 tablet: 1 | ORAL | @ 14:00:00

## 2019-07-15 MED ADMIN — vancomycin (FIRVANQ) oral solution: 125 mg | ORAL | @ 08:00:00 | Stop: 2019-07-24

## 2019-07-15 MED ADMIN — cholecalciferol (vitamin D3-125 mcg (5,000 unit)) tablet 125 mcg: 125 ug | ORAL | @ 14:00:00

## 2019-07-15 MED ADMIN — butalbital-acetaminophen-caffeine (ESGIC) per tablet 1 tablet: 1 | ORAL | @ 16:00:00 | Stop: 2019-07-15

## 2019-07-15 MED ADMIN — vancomycin (FIRVANQ) oral solution: 125 mg | ORAL | @ 21:00:00 | Stop: 2019-07-24

## 2019-07-15 MED ADMIN — predniSONE (DELTASONE) tablet 5 mg: 5 mg | ORAL | @ 14:00:00

## 2019-07-15 MED ADMIN — pantoprazole (PROTONIX) EC tablet 40 mg: 40 mg | ORAL | @ 14:00:00

## 2019-07-15 MED ADMIN — valGANciclovir (VALCYTE) tablet 450 mg: 450 mg | ORAL | @ 14:00:00

## 2019-07-15 MED ADMIN — sodium bicarbonate tablet 650 mg: 650 mg | ORAL | @ 18:00:00

## 2019-07-15 MED ADMIN — sodium chloride (NS) 0.9 % infusion: 100 mL/h | INTRAVENOUS | @ 06:00:00

## 2019-07-15 MED ADMIN — acetaminophen (TYLENOL) tablet 650 mg: 650 mg | ORAL | @ 14:00:00

## 2019-07-15 MED ADMIN — sodium bicarbonate tablet 650 mg: 650 mg | ORAL | @ 14:00:00

## 2019-07-15 NOTE — Unmapped (Cosign Needed)
Transplant Nephrology Consult     Requesting provider: Lawernce Keas, MD  Service requesting consult: MEDL  Reason for consult: aki, renal transplant      Assessment/Recommendations: Elaine Koch is a/an 34 y.o. 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    Status post simultaneous pancreas/kidney transplant, Allograft Function (improved, non-oliguric):   -Serum creatinine level is 1.96 today (significant improvement as compared to yesterday).   -baseline Cr seems to be around 1.1-1.3  -suspecting AKI is secondary to CNI toxicity given supratherapeutic levels (trough likely worsened in the setting of diarrhea/cdiff) and GI losses/prerenal injury.  See below for tacrolimus plan  -holding off on biopsy at this junction  -would recommend continuing IVF for today  -hold bactrim for now  -dose meds to gfr  -avoid nephrotoxic agents including but not limited to NSAIDs, contrast, etc    Immunosuppression [High risk medical decision making for drug therapy requiring intensive monitoring for toxicity]  -tac trough this am down to 8.3  -will restart envarsus at 18mg  daily (will see how her troughs are moving forward to see if she will need fluconazole vs cardizem to help with levels since she is on a high dose of tacrolimus)  -continue with prednisone 5mg  daily  -decreasing myfortic 720mg  BID to 540mg  BID given leukopenia/neutropenia (see below)  -Please obtain trough tacrolimus trough levels prior to the morning dose of the medication. The tacrolimus goal level is 8-10 ng/mL. Check levels daily    Leukopenia, neutropenia  -will decrease dose of myfortic down to 540mg  BID, monitor for fevers, monitor cbc with diff (monitoring ANC)    Infectious Prophylaxis and Monitoring:   -valcyte, hold bactrim as above    NAGMA, improving  -likely related to diarrhea and CNI/RTA, agree with sodium bicarb for now    Nausea/vomiting, diarrhea. cdiff positive  -check CMV  -continue with myfortic for now given resolution of symptoms  -agree with PO vanc    Seen and evaluated by Dr. Nestor Lewandowsky.  Will follow along with primary service. Please page on-call nephrology fellow with any questions/concerns.  Recommendations conveyed to primary service.     Anthony Sar  Division of Nephrology and Hypertension  [facility]  07/15/2019  11:48 AM      _____________________________________________________________________________________        Transplant Background  Transplant Date: 05/03/2019  Induction therapy: campath  KDPI: 34%  HLA mismatch: no  Serologies: CMV D+/R+, EBV D+/R+  Dialysis Vintage: Prior to transplant the patient was on dialysis since 2018.   Current maintenance immunosuppression: envarsus, myfortic, prednisone  Rejection episodes: none      INTERVAL HISTORY:   No acute events, renal function better. Still has headaches which has been a chronic issue. Adequate urine output      Medications:   Current Facility-Administered Medications   Medication Dose Route Frequency Provider Last Rate Last Admin   ??? acetaminophen (TYLENOL) tablet 650 mg  650 mg Oral Q6H PRN Kathrin Ruddy, MD   650 mg at 07/15/19 1015   ??? butalbital-acetaminophen-caffeine (ESGIC) per tablet 1 tablet  1 tablet Oral Once Jackelyn Poling, MD       ??? cholecalciferol (vitamin D3-125 mcg (5,000 unit)) tablet 125 mcg  125 mcg Oral Daily Heron Sabins, MD   125 mcg at 07/15/19 1017   ??? multivitamin, prenatal (folic acid-iron) tablet 1 tablet  1 tablet Oral Daily Heron Sabins, MD   1 tablet at 07/15/19 1016   ??? mycophenolate (MYFORTIC) EC tablet 720 mg  720 mg Oral BID Heron Sabins, MD   720 mg at 07/15/19 1017   ??? ondansetron (ZOFRAN) injection 4 mg  4 mg Intravenous Q6H PRN Jackelyn Poling, MD       ??? pantoprazole (PROTONIX) EC tablet 40 mg  40 mg Oral Daily Heron Sabins, MD   40 mg at 07/15/19 1016   ??? predniSONE (DELTASONE) tablet 5 mg  5 mg Oral Daily Heron Sabins, MD   5 mg at 07/15/19 1015   ??? sodium bicarbonate tablet 650 mg  650 mg Oral TID Drake Leach, PA   650 mg at 07/15/19 1016   ??? sodium chloride (NS) 0.9 % infusion  100 mL/hr Intravenous Continuous Jackelyn Poling, MD 100 mL/hr at 07/15/19 0221 100 mL/hr at 07/15/19 0221   ??? [START ON 07/16/2019] sulfamethoxazole-trimethoprim (BACTRIM) 400-80 mg tablet 80 mg of trimethoprim  1 tablet Oral Once per day on Mon Wed Fri Drake Leach, PA       ??? valGANciclovir (VALCYTE) tablet 450 mg  450 mg Oral Daily Leona Carry, MD   450 mg at 07/15/19 1016   ??? vancomycin [facility]) oral solution  125 mg Oral Q6H Jackelyn Poling, MD   125 mg at 07/15/19 1018        ALLERGIES  Iodinated contrast media, Nickel, Propranolol, Eye irrigating solution [ophthalmic irrigation solution], Iodine, Naltrexone, and Uni-cortrom    MEDICAL HISTORY  Past Medical History:   Diagnosis Date   ??? Chronic hypertension during pregnancy, antepartum 07/22/2015    Overview:  Methyldopa recommended per nephrologist if needed   ??? Diabetes mellitus type 1 (CMS-HCC)    ??? ESRD (end stage renal disease) (CMS-HCC)    ??? History of pre-eclampsia 10/24/2016   ??? 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 Stage 5 chronic kidney disease on chronic dialysis (CMS-HCC) 10/24/2016        SOCIAL HISTORY  Social History     Socioeconomic History   ??? Marital status: Single     Spouse name: Not on file   ??? Number of children: 1   ??? Years of education: Not on file   ??? Highest education level: Some college, no degree   Occupational History   ??? Not on file   Tobacco Use   ??? Smoking status: Never Smoker   ??? Smokeless tobacco: Never Used   Vaping Use   ??? Vaping Use: Never used   Substance and Sexual Activity   ??? Alcohol use: Never   ??? Drug use: Never   ??? Sexual activity: Not Currently   Other Topics Concern   ??? Not on file   Social History Narrative   ??? Not on file Social Determinants of Health     Financial Resource Strain:    ??? Difficulty of Paying Living Expenses:    Food Insecurity:    ??? Worried About Programme researcher, broadcasting/film/video in the Last Year:    ??? Barista in the Last Year:    Transportation Needs:    ??? Freight forwarder (Medical):    ??? Lack of Transportation (Non-Medical):    Physical Activity: Insufficiently Active   ??? Days of Exercise per Week: 2 days   ??? Minutes of Exercise per Session: 30 min   Stress: No Stress Concern Present   ???  Feeling of Stress : Only a little   Social Connections: Unknown   ??? Frequency of Communication with Friends and Family: Patient refused   ??? Frequency of Social Gatherings with Friends and Family: Patient refused   ??? Attends Religious Services: Patient refused   ??? Active Member of Clubs or Organizations: Patient refused   ??? Attends Banker Meetings: Patient refused   ??? Marital Status: Patient refused        FAMILY HISTORY  Family History   Problem Relation Age of Onset   ??? Diabetes Mother    ??? Diabetes Father    ??? Cancer Maternal Grandmother    ??? Diabetes Maternal Grandfather    ??? Diabetes Paternal Grandmother         Review of Systems:  A 12 system review of systems was negative except as noted in HPI.  Otherwise as per HPI, all other systems reviewed and negative    Physical Exam:  Vitals:    07/15/19 0311   BP: 134/81   Pulse: 79   Resp: 18   Temp: 36.5 ??C   SpO2: 99%     I/O this shift:  In: -   Out: 700 [Urine:700]    Intake/Output Summary (Last 24 hours) at 07/15/2019 1148  Last data filed at 07/15/2019 1021  Gross per 24 hour   Intake 2188 ml   Output 2100 ml   Net 88 ml       General: well-appearing, no acute distress  CV: regular rate, no peripheral edema  Lungs: normal work of breathing  Abdomen: non-distended  Skin: no visible lesions or rashes  Psych: alert, engaged, appropriate mood and affect  Neuro: normal speech, no gross focal deficits, slight tremors    Test Results  Reviewed  Lab Results   Component Value Date    NA 139 07/15/2019    K 4.7 07/15/2019    CL 116 (H) 07/15/2019    CO2 18.0 (L) 07/15/2019    BUN 38 (H) 07/15/2019    CREATININE 1.96 (H) 07/15/2019    GLU 97 07/15/2019    CALCIUM 8.9 07/15/2019    ALBUMIN 3.1 (L) 07/15/2019    PHOS 3.0 07/15/2019           I have reviewed all relevant outside healthcare records related to the patient's kidney injury.

## 2019-07-15 NOTE — Unmapped (Signed)
Pt is alert and oriented x 4 - VSS no distress noted. Will monitor  Problem: Infection  Goal: Infection Symptom Resolution  07/15/2019 0415 by Rosanne Ashing, RN  Outcome: Progressing  07/15/2019 0006 by Rosanne Ashing, RN  Outcome: Progressing     Problem: Adult Inpatient Plan of Care  Goal: Plan of Care Review  07/15/2019 0415 by Rosanne Ashing, RN  Outcome: Progressing  07/15/2019 0006 by Rosanne Ashing, RN  Outcome: Progressing  Goal: Patient-Specific Goal (Individualization)  07/15/2019 0415 by Rosanne Ashing, RN  Outcome: Progressing  07/15/2019 0006 by Rosanne Ashing, RN  Outcome: Progressing  Goal: Absence of Hospital-Acquired Illness or Injury  07/15/2019 0415 by Rosanne Ashing, RN  Outcome: Progressing  07/15/2019 0006 by Rosanne Ashing, RN  Outcome: Progressing  Goal: Optimal Comfort and Wellbeing  07/15/2019 0415 by Rosanne Ashing, RN  Outcome: Progressing  07/15/2019 0006 by Rosanne Ashing, RN  Outcome: Progressing  Goal: Readiness for Transition of Care  07/15/2019 0415 by Rosanne Ashing, RN  Outcome: Progressing  07/15/2019 0006 by Rosanne Ashing, RN  Outcome: Progressing  Goal: Rounds/Family Conference  07/15/2019 0415 by Rosanne Ashing, RN  Outcome: Progressing  07/15/2019 0006 by Rosanne Ashing, RN  Outcome: Progressing     Problem: Self-Care Deficit  Goal: Improved Ability to Complete Activities of Daily Living  07/15/2019 0415 by Rosanne Ashing, RN  Outcome: Progressing  07/15/2019 0006 by Rosanne Ashing, RN  Outcome: Progressing     Problem: Wound  Goal: Optimal Wound Healing  07/15/2019 0415 by Rosanne Ashing, RN  Outcome: Progressing  07/15/2019 0006 by Rosanne Ashing, RN  Outcome: Progressing     Problem: Diabetes Comorbidity  Goal: Blood Glucose Level Within Desired Range  07/15/2019 0415 by Rosanne Ashing, RN  Outcome: Progressing  07/15/2019 0006 by Rosanne Ashing, RN  Outcome: Progressing

## 2019-07-15 NOTE — Unmapped (Cosign Needed)
TRANSPLANT SURGERY PROGRESS NOTE     Assessment:  Elaine Koch is a 34 y.o. 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     Plan:    -Reg diet  -IVF  -Zofran for nausea  -Tac trough 8.3 this am. Restarted low dose Tac per nephrology.  -Immunosuppression: myfortic 540 BID, prednisone 5mg , holding Tac  -ID: Cdiff pos: oral vanc, bactrim ppx, Valcyte ppx  -Home meds: PPI, vit d  -Dispo: Pending      LDA: PIV     Subjective/Interval:  NAEON. Urine output improved with IVF and holding Tac. Given esgic this morning for headache with good results.      Objective:  Vitals:    07/15/19 1154   BP: 126/79   Pulse: 80   Resp: 18   Temp: 36.6 ??C   SpO2: 100%     HEENT: Normocephalic, atraumatic.  Cardiovascular: Regular rate and rhythm.  Respiratory:  Nonlabored respirations.  Abdomen: Nondistended. Nontender. No rebound or guarding. Incisions well healed.   GU: Deferred  Integumentary: Adequate peripheral perfusion  MSK: No deformities.  Neuro: No focal deficits appreciated.          Vicente Males, MD  General Surgery - PGY 1  Pager: [phone]

## 2019-07-15 NOTE — Unmapped (Signed)
[facility] - client coordination and missed visit report sent to HIM Suan Halter July 15, 2019 11:00 AM

## 2019-07-15 NOTE — Unmapped (Signed)
Tacrolimus Therapeutic Monitoring Pharmacy Note    Elaine Koch is a 34 y.o. female continuing tacrolimus.     Indication: Kidney/Pancreas     Date of Transplant: March 2021      Prior Dosing Information: Home regimen ENVARSUS 22mg  Daily - held due to supratherapeutic level    Goals:  Therapeutic Drug Levels  Tacrolimus trough goal: 8-10 ng/mL    Additional Clinical Monitoring/Outcomes  ?? Monitor renal function (SCr and urine output) and liver function (LFTs)  ?? Monitor for signs/symptoms of adverse events (e.g., hyperglycemia, hyperkalemia, hypomagnesemia, hypertension, headache, tremor)    Results:   Tacrolimus level: 8.9 ng/mL, drawn appropriately    Pharmacokinetic Considerations and Significant Drug Interactions:  ??? Concurrent hepatotoxic medications: None identified  ??? Concurrent CYP3A4 substrates/inhibitors: None identified  ??? Concurrent nephrotoxic medications: Bactrim    Assessment/Plan:  ?? 6/2 tacro level at 0631 (27.9ng/mL) is significantly elevated (Last dose 6/1, exact time unknown by this provider)  ?? Prior levels (for month of May 2021 appear to be within goal - per epic)  ?? Patient has had few days of diarrhea prior to admission  ?? 6/2 bun/scr > incr since adm on 6/1  (Baseline ?? 1.3-1.8)    Recommendation(s)  ??? Start Envarsus XR 18 mg daily per nephrology     Follow-up  ??? Tacrolimus levels daily.   ??? A pharmacist will continue to monitor and recommend levels as appropriate    Please page service pharmacist with questions/clarifications.    Crista Curb, PharmD

## 2019-07-15 NOTE — Unmapped (Signed)
Pt alert and oriented x4. Pt has been afebrile with stable VS. Pt with no c/o pain or nausea. Pt with active bowels and good urine output. BM sample sent for testing and pt came back c.diff positive. Enteric precautions maintained. Pt started on oral Vanc and IV fluids. Pt has been consented for a possible kidney biopsy sometime this weak. WCTM.       Problem: Infection  Goal: Infection Symptom Resolution  Outcome: Ongoing - Unchanged     Problem: Adult Inpatient Plan of Care  Goal: Plan of Care Review  Outcome: Ongoing - Unchanged  Goal: Patient-Specific Goal (Individualization)  Outcome: Ongoing - Unchanged  Goal: Absence of Hospital-Acquired Illness or Injury  Outcome: Ongoing - Unchanged  Goal: Optimal Comfort and Wellbeing  Outcome: Ongoing - Unchanged  Goal: Readiness for Transition of Care  Outcome: Ongoing - Unchanged  Goal: Rounds/Family Conference  Outcome: Ongoing - Unchanged     Problem: Self-Care Deficit  Goal: Improved Ability to Complete Activities of Daily Living  Outcome: Ongoing - Unchanged     Problem: Wound  Goal: Optimal Wound Healing  Outcome: Ongoing - Unchanged

## 2019-07-16 DIAGNOSIS — D703 Neutropenia due to infection: Principal | ICD-10-CM

## 2019-07-16 LAB — MAGNESIUM: Magnesium:MCnc:Pt:Ser/Plas:Qn:: 1.5 — ABNORMAL LOW

## 2019-07-16 LAB — CBC W/ AUTO DIFF
BASOPHILS ABSOLUTE COUNT: 0 10*9/L (ref 0.0–0.1)
BASOPHILS RELATIVE PERCENT: 0.2 %
EOSINOPHILS ABSOLUTE COUNT: 0.1 10*9/L (ref 0.0–0.4)
EOSINOPHILS RELATIVE PERCENT: 8.1 %
HEMATOCRIT: 30.3 % — ABNORMAL LOW (ref 36.0–46.0)
HEMOGLOBIN: 9.9 g/dL — ABNORMAL LOW (ref 12.0–16.0)
LARGE UNSTAINED CELLS: 5 % — ABNORMAL HIGH (ref 0–4)
LYMPHOCYTES RELATIVE PERCENT: 16 %
MEAN CORPUSCULAR HEMOGLOBIN CONC: 32.9 g/dL (ref 31.0–37.0)
MEAN CORPUSCULAR HEMOGLOBIN: 31 pg (ref 26.0–34.0)
MEAN CORPUSCULAR VOLUME: 94.4 fL (ref 80.0–100.0)
MEAN PLATELET VOLUME: 8.2 fL (ref 7.0–10.0)
MONOCYTES ABSOLUTE COUNT: 0.1 10*9/L — ABNORMAL LOW (ref 0.2–0.8)
MONOCYTES RELATIVE PERCENT: 10 %
NEUTROPHILS ABSOLUTE COUNT: 0.5 10*9/L — ABNORMAL LOW (ref 2.0–7.5)
NEUTROPHILS RELATIVE PERCENT: 60.9 %
PLATELET COUNT: 247 10*9/L (ref 150–440)
RED CELL DISTRIBUTION WIDTH: 16.4 % — ABNORMAL HIGH (ref 12.0–15.0)
WBC ADJUSTED: 0.8 10*9/L — ABNORMAL LOW (ref 4.5–11.0)

## 2019-07-16 LAB — COMPREHENSIVE METABOLIC PANEL
ALBUMIN: 2.9 g/dL — ABNORMAL LOW (ref 3.5–5.0)
ALT (SGPT): 17 U/L (ref <35)
ANION GAP: 2 mmol/L — ABNORMAL LOW (ref 7–15)
AST (SGOT): 23 U/L (ref 14–38)
BILIRUBIN TOTAL: 0.8 mg/dL (ref 0.0–1.2)
BLOOD UREA NITROGEN: 22 mg/dL — ABNORMAL HIGH (ref 7–21)
BUN / CREAT RATIO: 17
CALCIUM: 8.9 mg/dL (ref 8.5–10.2)
CHLORIDE: 120 mmol/L — ABNORMAL HIGH (ref 98–107)
CO2: 18 mmol/L — ABNORMAL LOW (ref 22.0–30.0)
CREATININE: 1.32 mg/dL — ABNORMAL HIGH (ref 0.60–1.00)
EGFR CKD-EPI AA FEMALE: 62 mL/min/{1.73_m2} (ref >=60)
EGFR CKD-EPI NON-AA FEMALE: 54 mL/min/{1.73_m2} — ABNORMAL LOW (ref >=60)
GLUCOSE RANDOM: 86 mg/dL (ref 70–179)
POTASSIUM: 4.7 mmol/L (ref 3.5–5.0)
PROTEIN TOTAL: 5.8 g/dL — ABNORMAL LOW (ref 6.5–8.3)
SODIUM: 140 mmol/L (ref 135–145)

## 2019-07-16 LAB — TACROLIMUS, TROUGH: Lab: 8.4

## 2019-07-16 LAB — AMYLASE: Amylase:CCnc:Pt:Ser/Plas:Qn:: 119 — ABNORMAL HIGH

## 2019-07-16 LAB — PHOSPHORUS: Phosphate:MCnc:Pt:Ser/Plas:Qn:: 2.7 — ABNORMAL LOW

## 2019-07-16 LAB — CREATININE: Creatinine:MCnc:Pt:Ser/Plas:Qn:: 1.32 — ABNORMAL HIGH

## 2019-07-16 LAB — LARGE UNSTAINED CELLS: Lab: 5 — ABNORMAL HIGH

## 2019-07-16 LAB — LIPASE: Triacylglycerol lipase:CCnc:Pt:Ser/Plas:Qn:: 96

## 2019-07-16 MED ORDER — SODIUM BICARBONATE 650 MG TABLET
ORAL_TABLET | Freq: Three times a day (TID) | ORAL | 11 refills | 34 days | Status: CP
Start: 2019-07-16 — End: 2020-07-15
  Filled 2019-07-16: qty 100, 34d supply, fill #0

## 2019-07-16 MED ORDER — MYCOPHENOLATE SODIUM 180 MG TABLET,DELAYED RELEASE: 540 mg | tablet | Freq: Two times a day (BID) | 11 refills | 30 days

## 2019-07-16 MED ORDER — VANCOMYCIN 125 MG CAPSULE
ORAL_CAPSULE | Freq: Four times a day (QID) | ORAL | 0 refills | 8.00000 days | Status: CP
Start: 2019-07-16 — End: 2019-07-24
  Filled 2019-07-16: qty 32, 8d supply, fill #0

## 2019-07-16 MED ORDER — MYCOPHENOLATE SODIUM 180 MG TABLET,DELAYED RELEASE
ORAL_TABLET | Freq: Two times a day (BID) | ORAL | 11 refills | 30.00000 days
Start: 2019-07-16 — End: 2019-07-16

## 2019-07-16 MED ADMIN — sodium bicarbonate tablet 650 mg: 650 mg | ORAL | @ 12:00:00 | Stop: 2019-07-16

## 2019-07-16 MED ADMIN — sulfamethoxazole-trimethoprim (BACTRIM) 400-80 mg tablet 80 mg of trimethoprim: 1 | ORAL | @ 12:00:00 | Stop: 2019-07-16

## 2019-07-16 MED ADMIN — tacrolimus (ENVARSUS XR) extended release tablet 18 mg: 18 mg | ORAL | @ 12:00:00 | Stop: 2019-07-16

## 2019-07-16 MED ADMIN — vancomycin (FIRVANQ) oral solution: 125 mg | ORAL | @ 02:00:00 | Stop: 2019-07-24

## 2019-07-16 MED ADMIN — vancomycin (FIRVANQ) oral solution: 125 mg | ORAL | @ 13:00:00 | Stop: 2019-07-16

## 2019-07-16 MED ADMIN — mycophenolate (MYFORTIC) EC tablet 540 mg: 540 mg | ORAL | @ 13:00:00 | Stop: 2019-07-16

## 2019-07-16 MED FILL — SODIUM BICARBONATE 650 MG TABLET: 34 days supply | Qty: 100 | Fill #0 | Status: AC

## 2019-07-16 MED FILL — VANCOMYCIN 125 MG CAPSULE: 8 days supply | Qty: 32 | Fill #0 | Status: AC

## 2019-07-16 NOTE — Unmapped (Signed)
Pt alert and oriented x4. Pt has been afebrile with stable VS. Pt with c/o headache pain. PRN med given per orders. Pt with good urine output. Enteric precautions maintained. Pt continues on oral Vanc and IV fluids. WCTM.          Problem: Infection  Goal: Infection Symptom Resolution  Outcome: Ongoing - Unchanged     Problem: Adult Inpatient Plan of Care  Goal: Plan of Care Review  Outcome: Ongoing - Unchanged  Goal: Patient-Specific Goal (Individualization)  Outcome: Ongoing - Unchanged  Goal: Absence of Hospital-Acquired Illness or Injury  Outcome: Ongoing - Unchanged  Goal: Optimal Comfort and Wellbeing  Outcome: Ongoing - Unchanged  Goal: Readiness for Transition of Care  Outcome: Ongoing - Unchanged  Goal: Rounds/Family Conference  Outcome: Ongoing - Unchanged     Problem: Self-Care Deficit  Goal: Improved Ability to Complete Activities of Daily Living  Outcome: Ongoing - Unchanged     Problem: Wound  Goal: Optimal Wound Healing  Outcome: Ongoing - Unchanged     Problem: Diabetes Comorbidity  Goal: Blood Glucose Level Within Desired Range  Outcome: Ongoing - Unchanged

## 2019-07-16 NOTE — Unmapped (Signed)
Pt is alert and oriented x 4 - VSS- no distress noted.  Denies having diarrhea - continues on oral vanc for cdiff +- will monitor  Problem: Infection  Goal: Infection Symptom Resolution  Outcome: Progressing     Problem: Adult Inpatient Plan of Care  Goal: Plan of Care Review  Outcome: Progressing  Goal: Patient-Specific Goal (Individualization)  Outcome: Progressing  Goal: Absence of Hospital-Acquired Illness or Injury  Outcome: Progressing  Goal: Optimal Comfort and Wellbeing  Outcome: Progressing  Goal: Readiness for Transition of Care  Outcome: Progressing  Goal: Rounds/Family Conference  Outcome: Progressing     Problem: Self-Care Deficit  Goal: Improved Ability to Complete Activities of Daily Living  Outcome: Progressing     Problem: Wound  Goal: Optimal Wound Healing  Outcome: Progressing     Problem: Diabetes Comorbidity  Goal: Blood Glucose Level Within Desired Range  Outcome: Progressing

## 2019-07-16 NOTE — Unmapped (Signed)
Discharge Summary    Admit date: 07/13/2019    Discharge date and time: 07/16/2019    Discharge to:  Home    Discharge Service: Surg Transplant [facility])    Discharge Attending Physician: Lilyan Punt [facility]*    Discharge Diagnoses: Cdiff colitis    Secondary Diagnosis: Principal Problem:    AKI (acute kidney injury) (CMS-HCC)  Active Problems:    History of simultaneous kidney and pancreas transplant (CMS-HCC)    Nausea & vomiting    Diarrhea      OR Procedures:  None     Ancillary Procedures: no procedures    Discharge Day Services: The patient was seen and examined by the Transplant Surgery team on the day of discharge. Vital signs and laboratory values were stable and within normal limits. Surgical wounds were inspected and found to be consistent with the performed procedure. Discharge plan was discussed, instructions were given, and all questions answered.    Subjective  No acute events overnight. BM improved in consistency. N/V improved. Hydrating well.    Objective   Patient Vitals for the past 8 hrs:   BP Temp Temp src Pulse Resp SpO2   07/16/19 0347 129/74 37 ??C Oral 74 18 98 %     I/O this shift:  In: 240 [P.O.:240]  Out: 300 [Urine:300]    Physical Exam  HEENT: Normocephalic, atraumatic.  Cardiovascular: Regular rate and rhythm.  Respiratory:  Nonlabored respirations.  Abdomen: Nondistended. Nontender. No rebound or guarding.  GU: Deferred  Integumentary: Adequate peripheral perfusion  MSK: No deformities.  Neuro: No focal deficits appreciated.      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       AKI: Patient s/p transplant on 05/03/19 with creatinine baseline of ~1.2. Upon presentation to ED, creatinine was elevated to 3.22 and patient was hypotensive (SBP 80-90s). BP improved following 2 L bolus LR. Renal US did not show any change from prior though did not some free fluid in the pelvis. Patient had recent bump in amylase that was downtrending on repeat clinic visits, other virology studies performed in clinic on 5/25 were negative. Nephrology following. Tac held on admission, trough levels elevated to 27.9. Creatinine downtrended to baseline by discharge. Tac trough improved and dosage adjusted by time of discharge.    Hyperkalemia: Potassium elevated following recheck 2/2 EKG changes. Bactrim held 6/2. Transitioned to low potassium diet, will continue trending. Will restart at discharge.  ??  N/V/diarrhea - Hypotension: Patient reports four days of N/V, diarrhea, and HA prior to admission. Afebrile with leukopenia secondary to immunosuppressive therapy. Leukopenia worsening 6/2. Hypotensive on admission with improvement in blood pressure secondary to fluids. Was normotensive at most recent clinic visit, though had previously been on midodrine for hypotension post-transplant. Urine output improved with holding tac and hydration. Making adequate urine.     S/p Kidney-Pancreas transplant:  Following with [facility] transplant clinic, last seen on 5/25. Currently on Tacrolimus 9 mg TID with trough target of 8-10, MMF 720mg  and prednisone secondary to amylase bump noted on (05/27/19). Also taking 900 Valcyte for prophylaxis, CMV negative on 06/01/19. BK not detected on 07/06/19 screening. Tac restarted.     CDiFF: Found to be Cdiff positive on admission. Treated with oral vancomycin with improvement in bowel movements. Will  continue treatment for total of 10 days at discharge.     Patient had returned to her baseline by HD 3 and is being discharged to home with planned outpatient follow up.        Condition at Discharge: Improved  Discharge Medications:      Your Medication List      STOP taking these medications    metoclopramide 10 MG tablet  Commonly known as: REGLAN        START taking these medications    sodium bicarbonate 650 mg tablet  Take 1 tablet (650 mg total) by mouth Three (3) times a day.     vancomycin 125 MG capsule  Commonly known as: VANCOCIN  Take 1 capsule (125 mg total) by mouth Four (4) times a day for 8 days.        CHANGE how you take these medications    mycophenolate 180 MG EC tablet  Commonly known as: MYFORTIC  Take 3 tablets (540 mg total) by mouth Two (2) times a day.  What changed: how much to take        CONTINUE taking these medications    acetaminophen 500 MG tablet  Commonly known as: TYLENOL  Take 1-2 tablets (500-1,000 mg total) by mouth every six (6) hours as needed for pain or fever greater than 100.43F (38C).     aspirin 81 MG tablet  Commonly known as: ECOTRIN  Take 1 tablet (81 mg total) by mouth daily.     biotin 5 mg tablet  Take 1 tablet (5 mg total) by mouth daily.     cholecalciferol (vitamin D3-125 mcg (5,000 unit)) 125 mcg (5,000 unit) capsule  Take 1 capsule (125 mcg total) by mouth daily.     ENVARSUS XR 1 mg Tb24 extended release tablet  Generic drug: tacrolimus  Take 2 tablets (2mg ) by mouth once daily in addition to 4 mg tablets for total dose of 22 mg daily.     ENVARSUS XR 4 mg Tb24 extended release tablet  Generic drug: tacrolimus  Take 5 tablets (20mg ) by mouth once daily in addition to 1 mg tablets for total dose of 22 mg daily.     multivitamin, prenatal (folic acid-iron) 27-1 mg Tab  Take 1 tablet by mouth daily.     pantoprazole 40 MG tablet  Commonly known as: PROTONIX  Take 1 tablet (40 mg total) by mouth daily.     predniSONE 5 MG tablet  Commonly known as: DELTASONE  Take 1 tablet (5 mg total) by mouth daily.     sulfamethoxazole-trimethoprim 400-80 mg per tablet  Commonly known as: BACTRIM  Take 1 tablet (80 mg of trimethoprim total) by mouth every Monday, Wednesday, and Friday.     valGANciclovir 450 mg tablet  Commonly known as: VALCYTE  Take 2 tablets (900 mg total) by mouth daily.            Pending Test Results: None    Discharge Instructions:    Other Instructions     Discharge instructions      Activity: As tolerated    Diet: Regular    Contact your transplant coordinator or the Transplant Surgery Office [phone]) during business hours or page the transplant coordinator on call [phone]) after business hours for:    - fever >100.5 degrees F by mouth, any fever with shaking chills, or other signs or symptoms of infection   - uncontrolled nausea, vomiting, or diarrhea; inability to have a bowel movement for >  3 days.   - any problem that prevents taking medications as scheduled.   - pain uncontrolled with prescribed medication or new pain or tenderness at the surgical site   - sudden weight gain or increase in blood pressure (greater than 140/85)   - shortness of breath, chest pain / discomfort   - new or increasing jaundice   - urinary symptoms including pain / difficulty / burning or tea-colored urine   - any other new or concerning symptoms   - questions regarding your medications or continuing care         Activity: As tolerated    Diet: Regular    Contact your transplant coordinator or the Transplant Surgery Office [phone]) during business hours or page the transplant coordinator on call [phone]) after business hours for:    - fever >100.5 degrees F by mouth, any fever with shaking chills, or other signs or symptoms of infection   - uncontrolled nausea, vomiting, or diarrhea; inability to have a bowel movement for > 3 days.   - any problem that prevents taking medications as scheduled.   - pain uncontrolled with prescribed medication or new pain or tenderness at the surgical site   - sudden weight gain or increase in blood pressure (greater than 140/85)   - shortness of breath, chest pain / discomfort   - new or increasing jaundice   - urinary symptoms including pain / difficulty / burning or tea-colored urine   - any other new or concerning symptoms   - questions regarding your medications or continuing care Labs and Other Follow-ups after Discharge:  Follow Up instructions and Outpatient Referrals     Ambulatory referral to Home Health      Is this a [facility] or [facility] Patient?: No    Physician to follow patient's care: Referring Provider    Disciplines requested: Nursing    Nursing requested: Teaching/skilled observation and assessment    What teaching is needed (new diagnosis? new medications?): kidney pancreas transplant    Special instructions: the next M/Th following DC before 9am for txp specific labs    Please draw following labs on MTh, before 9AM: Na, K, Cl, CO2, BUN, Cr, Gluc, CA, Albumin, PO4, CBC/diff, Mg, amylase, lipase, and tacrolimus (Prograf) trough     Please draw weekly: A1C    All communication with MD and lab results can be sent to attention of: Margaretha Glassing  [phone]; [phone]    TO [facility]: If you are unable to accommodate any of the above services w/in the requested time frame, please call the above named Transplant Nurse Coordinator so that alternative arrangements can be made in a timely manner.  Thank you!              I certify that Archer Vise is confined to his/her home and needs intermittent skilled nursing care, physical therapy and/or speech therapy or continues to need occupational therapy. The patient is under my care, and I have authorized services on this plan of care and will periodically review the plan. The patient had a face-to-face encounter with an allowed provider type on 07/16/2019 and the encounter was related to the primary reason for home health care.    Discharge instructions      Activity: As tolerated    Diet: Regular    Contact your transplant coordinator or the Transplant Surgery Office [phone]) during business hours or page the transplant coordinator on call [phone])  after business hours for:    - fever >100.5 degrees F by mouth, any fever with shaking chills, or other signs or symptoms of infection   - uncontrolled nausea, vomiting, or diarrhea; inability to have a bowel movement for > 3 days.   - any problem that prevents taking medications as scheduled.   - pain uncontrolled with prescribed medication or new pain or tenderness at the surgical site   - sudden weight gain or increase in blood pressure (greater than 140/85)   - shortness of breath, chest pain / discomfort   - new or increasing jaundice   - urinary symptoms including pain / difficulty / burning or tea-colored urine   - any other new or concerning symptoms   - questions regarding your medications or continuing care          Future Appointments:  Appointments which have been scheduled for you    Aug 06, 2019  9:00 AM  (Arrive by 8:45 AM)  RETURN NEPHROLOGY POST with Brayton Caves, MD  [facility]NEY TRANSPLANT [place]) [address]  [place] [address]  [phone]

## 2019-07-16 NOTE — Unmapped (Addendum)
Pt pleasant and cooperative. Pt has had no acute events this shift. Pt denies and c/o, denies N/V/D. IVFs stopped and IV removed without problem. Pt tolerated well. Pt's mom at [facility]. I reviewed all discharge instructions with pt, answered questions, and she verbalized understanding. Discharge instructions updated, so I re-printed and reviewed changes with pt who verb understanding. Pt in NAD.    Problem: Infection  Goal: Infection Symptom Resolution  Outcome: Discharged to Home     Problem: Adult Inpatient Plan of Care  Goal: Plan of Care Review  Outcome: Discharged to Home  Goal: Patient-Specific Goal (Individualization)  Outcome: Discharged to Home  Goal: Absence of Hospital-Acquired Illness or Injury  Outcome: Discharged to Home  Goal: Optimal Comfort and Wellbeing  Outcome: Discharged to Home  Goal: Readiness for Transition of Care  Outcome: Discharged to Home  Goal: Rounds/Family Conference  Outcome: Discharged to Home     Problem: Self-Care Deficit  Goal: Improved Ability to Complete Activities of Daily Living  Outcome: Discharged to Home     Problem: Wound  Goal: Optimal Wound Healing  Outcome: Discharged to Home     Problem: Diabetes Comorbidity  Goal: Blood Glucose Level Within Desired Range  Outcome: Discharged to Home

## 2019-07-19 DIAGNOSIS — Z9483 Pancreas transplant status: Principal | ICD-10-CM

## 2019-07-19 DIAGNOSIS — Z940 Kidney transplant status: Principal | ICD-10-CM

## 2019-07-19 LAB — HLA DS POST TRANSPLANT
ANTI-DONOR DRW #2 MFI: 453 MFI
ANTI-DONOR HLA-A #1 MFI: 60 MFI
ANTI-DONOR HLA-A #2 MFI: 75 MFI
ANTI-DONOR HLA-B #1 MFI: 98 MFI
ANTI-DONOR HLA-B #2 MFI: 508 MFI
ANTI-DONOR HLA-C #1 MFI: 119 MFI
ANTI-DONOR HLA-C #2 MFI: 377 MFI
ANTI-DONOR HLA-DR #1 MFI: 393 MFI
ANTI-DONOR HLA-DR #2 MFI: 375 MFI

## 2019-07-19 LAB — HLA CL2 AB RESULT: Lab: POSITIVE

## 2019-07-19 LAB — HLA CLASS 1 ANTIBODY RESULT: Lab: POSITIVE

## 2019-07-19 LAB — FSAB CLASS 1 ANTIBODY SPECIFICITY

## 2019-07-19 LAB — DONOR HLA-DQB ANTIGEN #1

## 2019-07-19 LAB — FSAB CLASS 2 ANTIBODY SPECIFICITY

## 2019-07-20 DIAGNOSIS — Z9483 Pancreas transplant status: Principal | ICD-10-CM

## 2019-07-20 DIAGNOSIS — Z940 Kidney transplant status: Principal | ICD-10-CM

## 2019-07-21 DIAGNOSIS — D702 Other drug-induced agranulocytosis: Principal | ICD-10-CM

## 2019-07-21 DIAGNOSIS — Z9483 Pancreas transplant status: Principal | ICD-10-CM

## 2019-07-21 DIAGNOSIS — Z940 Kidney transplant status: Principal | ICD-10-CM

## 2019-07-21 DIAGNOSIS — D849 Immunodeficiency, unspecified: Principal | ICD-10-CM

## 2019-07-21 LAB — CBC W/ DIFFERENTIAL
BASOPHILS ABSOLUTE COUNT: 0 10*3/uL (ref 0.0–0.2)
BASOPHILS RELATIVE PERCENT: 5 %
EOSINOPHILS ABSOLUTE COUNT: 0.1 10*3/uL (ref 0.0–0.4)
HEMATOCRIT: 31 % — ABNORMAL LOW (ref 34.0–46.6)
HEMOGLOBIN: 9.8 g/dL — ABNORMAL LOW (ref 11.1–15.9)
LYMPHOCYTES RELATIVE PERCENT: 41 %
MEAN CORPUSCULAR HEMOGLOBIN CONC: 31.6 g/dL (ref 31.5–35.7)
MEAN CORPUSCULAR HEMOGLOBIN: 29.8 pg (ref 26.6–33.0)
MEAN CORPUSCULAR VOLUME: 94 fL (ref 79–97)
MONOCYTES ABSOLUTE COUNT: 0 10*3/uL — ABNORMAL LOW (ref 0.1–0.9)
MONOCYTES RELATIVE PERCENT: 7 %
NEUTROPHILS ABSOLUTE COUNT: 0.2 10*3/uL — CL (ref 1.4–7.0)
NEUTROPHILS RELATIVE PERCENT: 27 %
PLATELET COUNT: 235 10*3/uL (ref 150–450)
RED BLOOD CELL COUNT: 3.29 x10E6/uL — ABNORMAL LOW (ref 3.77–5.28)
RED CELL DISTRIBUTION WIDTH: 14.8 % (ref 11.7–15.4)

## 2019-07-21 LAB — RENAL FUNCTION PANEL
ALBUMIN: 3.8 g/dL (ref 3.8–4.8)
BLOOD UREA NITROGEN: 15 mg/dL (ref 6–20)
BUN / CREAT RATIO: 11 (ref 9–23)
CHLORIDE: 113 mmol/L — ABNORMAL HIGH (ref 96–106)
CO2: 18 mmol/L — ABNORMAL LOW (ref 20–29)
CREATININE: 1.38 mg/dL — ABNORMAL HIGH (ref 0.57–1.00)
GFR MDRD AF AMER: 59 mL/min/{1.73_m2} — ABNORMAL LOW
GLUCOSE: 84 mg/dL (ref 65–99)
SODIUM: 139 mmol/L (ref 134–144)

## 2019-07-21 LAB — LIPASE: Triacylglycerol lipase:CCnc:Pt:Ser/Plas:Qn:: 29

## 2019-07-21 LAB — AMYLASE: Amylase:CCnc:Pt:Ser/Plas:Qn:: 139 — ABNORMAL HIGH

## 2019-07-21 LAB — WHITE BLOOD CELL COUNT: Leukocytes:NCnc:Pt:Bld:Qn:Automated count: 0.6 — ABNORMAL LOW

## 2019-07-21 LAB — BANDS: Neutrophils.band form/100 leukocytes:NFr:Pt:Bld:Qn:Automated count: 2

## 2019-07-21 LAB — BUN / CREAT RATIO: Urea nitrogen/Creatinine:MRto:Pt:Ser/Plas:Qn:: 11

## 2019-07-21 LAB — MAGNESIUM: Magnesium:MCnc:Pt:Ser/Plas:Qn:: 1.3 — ABNORMAL LOW

## 2019-07-21 MED ORDER — VALGANCICLOVIR 450 MG TABLET
ORAL_TABLET | Freq: Every day | ORAL | 1 refills | 30 days | Status: CP
Start: 2019-07-21 — End: 2019-10-19

## 2019-07-21 MED ORDER — MYCOPHENOLATE SODIUM 180 MG TABLET,DELAYED RELEASE
ORAL_TABLET | Freq: Two times a day (BID) | ORAL | 11 refills | 23 days | Status: CP
Start: 2019-07-21 — End: 2020-07-20

## 2019-07-21 NOTE — Unmapped (Signed)
CMV add-on w/ [facility]

## 2019-07-21 NOTE — Unmapped (Signed)
Pt request RX Refill valGANciclovir (VALCYTE) 450 mg tablet

## 2019-07-21 NOTE — Unmapped (Addendum)
6/14 update: myfortic and valcyte rxs were discontinued before sent out per below order. Per coordinator WF, both meds are being stopped at this time and patient is aware. This is also documented in 6/11 coordinator note. So myfortic and valcyte are NOT being sent out per below note. Refill call already set up again for this week to touch base on other meds -ef      [facility] Clinical Assessment & Refill Coordination Note    Elaine Koch, DOB: [birth year]  Phone: [phone] (home)     All above HIPAA information was verified with patient.     Was a Nurse, learning disability used for this call? No    Specialty Medication(s):   Transplant: Envarsus 1mg , Envarsus 4mg ,  mycophenolic acid 180mg  and valgancyclovir 450mg      Current Outpatient Medications   Medication Sig Dispense Refill   ??? acetaminophen (TYLENOL) 500 MG tablet Take 1-2 tablets (500-1,000 mg total) by mouth every six (6) hours as needed for pain or fever greater than 100.67F (38C). 100 tablet 0   ??? aspirin (ECOTRIN) 81 MG tablet Take 1 tablet (81 mg total) by mouth daily. 30 tablet 11   ??? biotin 5 mg tablet Take 1 tablet (5 mg total) by mouth daily. 30 tablet 11   ??? cholecalciferol, vitamin D3-125 mcg, 5,000 unit,, 125 mcg (5,000 unit) capsule Take 1 capsule (125 mcg total) by mouth daily. 100 capsule 10   ??? multivitamin, prenatal, folic acid-iron, 27-1 mg Tab Take 1 tablet by mouth daily.     ??? mycophenolate (MYFORTIC) 180 MG EC tablet Take 2 tablets (360 mg total) by mouth Two (2) times a day. 180 tablet 11   ??? pantoprazole (PROTONIX) 40 MG tablet Take 1 tablet (40 mg total) by mouth daily. 30 tablet 11   ??? predniSONE (DELTASONE) 5 MG tablet Take 1 tablet (5 mg total) by mouth daily. 30 tablet 11   ??? sodium bicarbonate 650 mg tablet Take 1 tablet (650 mg total) by mouth Three (3) times a day. 100 tablet 11   ??? tacrolimus (ENVARSUS XR) 1 mg Tb24 extended release tablet Take 2 tablets (2mg ) by mouth once daily in addition to 4 mg tablets for total dose of 22 mg daily. 60 tablet 11   ??? tacrolimus (ENVARSUS XR) 4 mg Tb24 extended release tablet Take 5 tablets (20mg ) by mouth once daily in addition to 1 mg tablets for total dose of 22 mg daily. 150 tablet 11   ??? valGANciclovir (VALCYTE) 450 mg tablet Take 2 tablets (900 mg total) by mouth daily. 60 tablet 1   ??? vancomycin (VANCOCIN) 125 MG capsule Take 1 capsule (125 mg total) by mouth Four (4) times a day for 8 days. 32 capsule 0     No current facility-administered medications for this visit.        Changes to medications: Elaine Koch reports no changes at this time.    Allergies   Allergen Reactions   ??? Iodinated Contrast Media Swelling, Rash and Other (See Comments)     Burning, warmth throughout body and tingling.  Throat swelling.  Treated with benadryl and symptoms improved.  Has not had contrast since then (2013 or 2014).   ??? Nickel Rash   ??? Propranolol Swelling   ??? Eye Irrigating Solution [Ophthalmic Irrigation Solution] Other (See Comments)     Contrast dye (name?) used in eyes caused hot feeling in face, reversed with Benadryl.    ??? Iodine  Other reaction(s): Skin Rash   ??? Naltrexone Rash   ??? Uni-Cortrom Rash       Changes to allergies: No    SPECIALTY MEDICATION ADHERENCE     Envarsus Xr 1 mg: 15 days of medicine on hand   Envarsus Xr 4 mg: 15 days of medicine on hand   Valganciclovir 450 mg: 10 days of medicine on hand   Mycophenolate 180 mg: 10 days of medicine on hand     Medication Adherence    Patient reported X missed doses in the last month: 0  Specialty Medication: Envarsus Xr 1mg   Patient is on additional specialty medications: Yes  Additional Specialty Medications: Envarsus Xr 4mg   Patient Reported Additional Medication X Missed Doses in the Last Month: 0  Patient is on more than two specialty medications: Yes  Specialty Medication: Mycophenolate 180mg   Patient Reported Additional Medication X Missed Doses in the Last Month: 0  Specialty Medication: Valganciclovir 450mg  Patient Reported Additional Medication X Missed Doses in the Last Month: 0          Specialty medication(s) dose(s) confirmed: Regimen is correct and unchanged.     Are there any concerns with adherence? No    Adherence counseling provided? Not needed    CLINICAL MANAGEMENT AND INTERVENTION      Clinical Benefit Assessment:    Do you feel the medicine is effective or helping your condition? Yes    Clinical Benefit counseling provided? Not needed    Adverse Effects Assessment:    Are you experiencing any side effects? No    Are you experiencing difficulty administering your medicine? No    Quality of Life Assessment:    How many days over the past month did your kidney-pancreas transplant  keep you from your normal activities? For example, brushing your teeth or getting up in the morning. 1    Have you discussed this with your provider? Not needed    Therapy Appropriateness:    Is therapy appropriate? Yes, therapy is appropriate and should be continued    DISEASE/MEDICATION-SPECIFIC INFORMATION      N/A    PATIENT SPECIFIC NEEDS     - Does the patient have any physical, cognitive, or cultural barriers? No    - Is the patient high risk? Yes, patient is taking a REMS drug. Medication is dispensed in compliance with REMS program.     - Does the patient require a Care Management Plan? No     - Does the patient require physician intervention or other additional services (i.e. nutrition, smoking cessation, social work)? No      SHIPPING     Specialty Medication(s) to be Shipped:   Transplant:  mycophenolic acid 180mg  and valgancyclovir 450mg     Other medication(s) to be shipped: none     Changes to insurance: No    Delivery Scheduled: Yes, Expected medication delivery date: 07/27/19.     Medication will be delivered via UPS to the confirmed prescription address in [facility].    The patient will receive a drug information handout for each medication shipped and additional FDA Medication Guides as required.  Verified that patient has previously received a Conservation officer, historic buildings.    All of the patient's questions and concerns have been addressed.    Tera Helper   [facility] Specialty Pharmacist

## 2019-07-22 ENCOUNTER — Ambulatory Visit: Admit: 2019-07-22 | Discharge: 2019-07-22 | Payer: MEDICARE

## 2019-07-22 ENCOUNTER — Institutional Professional Consult (permissible substitution): Admit: 2019-07-22 | Discharge: 2019-07-22 | Payer: MEDICARE

## 2019-07-22 DIAGNOSIS — D702 Other drug-induced agranulocytosis: Principal | ICD-10-CM

## 2019-07-22 DIAGNOSIS — Z940 Kidney transplant status: Principal | ICD-10-CM

## 2019-07-22 DIAGNOSIS — Z9483 Pancreas transplant status: Principal | ICD-10-CM

## 2019-07-22 DIAGNOSIS — E119 Type 2 diabetes mellitus without complications: Principal | ICD-10-CM

## 2019-07-22 DIAGNOSIS — D72819 Decreased white blood cell count, unspecified: Principal | ICD-10-CM

## 2019-07-22 DIAGNOSIS — D709 Neutropenia, unspecified: Principal | ICD-10-CM

## 2019-07-22 LAB — CBC W/ AUTO DIFF
BASOPHILS ABSOLUTE COUNT: 0 10*9/L (ref 0.0–0.1)
BASOPHILS RELATIVE PERCENT: 2.7 %
EOSINOPHILS ABSOLUTE COUNT: 0.1 10*9/L (ref 0.0–0.4)
EOSINOPHILS RELATIVE PERCENT: 11.8 %
HEMATOCRIT: 34 % — ABNORMAL LOW (ref 36.0–46.0)
HEMOGLOBIN: 11 g/dL — ABNORMAL LOW (ref 12.0–16.0)
LARGE UNSTAINED CELLS: 5 % — ABNORMAL HIGH (ref 0–4)
LYMPHOCYTES ABSOLUTE COUNT: 0.1 10*9/L — ABNORMAL LOW (ref 1.5–5.0)
LYMPHOCYTES RELATIVE PERCENT: 18.8 %
MEAN CORPUSCULAR HEMOGLOBIN CONC: 32.3 g/dL (ref 31.0–37.0)
MEAN CORPUSCULAR HEMOGLOBIN: 30.8 pg (ref 26.0–34.0)
MEAN CORPUSCULAR VOLUME: 95.5 fL (ref 80.0–100.0)
MEAN PLATELET VOLUME: 8.8 fL (ref 7.0–10.0)
MONOCYTES ABSOLUTE COUNT: 0.1 10*9/L — ABNORMAL LOW (ref 0.2–0.8)
NEUTROPHILS ABSOLUTE COUNT: 0.3 10*9/L — CL (ref 2.0–7.5)
NEUTROPHILS RELATIVE PERCENT: 53.4 %
PLATELET COUNT: 274 10*9/L (ref 150–440)
RED BLOOD CELL COUNT: 3.56 10*12/L — ABNORMAL LOW (ref 4.00–5.20)
RED CELL DISTRIBUTION WIDTH: 16.5 % — ABNORMAL HIGH (ref 12.0–15.0)

## 2019-07-22 LAB — MAGNESIUM: Magnesium:MCnc:Pt:Ser/Plas:Qn:: 1.3 — ABNORMAL LOW

## 2019-07-22 LAB — COMPREHENSIVE METABOLIC PANEL
ALBUMIN: 3.9 g/dL (ref 3.5–5.0)
ALKALINE PHOSPHATASE: 60 U/L (ref 38–126)
ALT (SGPT): 27 U/L (ref <35)
ANION GAP: 3 mmol/L — ABNORMAL LOW (ref 7–15)
AST (SGOT): 25 U/L (ref 14–38)
BILIRUBIN TOTAL: 0.7 mg/dL (ref 0.0–1.2)
BLOOD UREA NITROGEN: 16 mg/dL (ref 7–21)
BUN / CREAT RATIO: 14
CALCIUM: 9.3 mg/dL (ref 8.5–10.2)
CHLORIDE: 116 mmol/L — ABNORMAL HIGH (ref 98–107)
CO2: 22 mmol/L (ref 22.0–30.0)
CREATININE: 1.16 mg/dL — ABNORMAL HIGH (ref 0.60–1.00)
EGFR CKD-EPI AA FEMALE: 72 mL/min/{1.73_m2} (ref >=60)
GLUCOSE RANDOM: 100 mg/dL (ref 70–179)
POTASSIUM: 4.9 mmol/L (ref 3.5–5.0)
PROTEIN TOTAL: 6.9 g/dL (ref 6.5–8.3)
SODIUM: 141 mmol/L (ref 135–145)

## 2019-07-22 LAB — LIPASE: Triacylglycerol lipase:CCnc:Pt:Ser/Plas:Qn:: 90

## 2019-07-22 LAB — SMEAR REVIEW

## 2019-07-22 LAB — CO2: Carbon dioxide:SCnc:Pt:Ser/Plas:Qn:: 22

## 2019-07-22 LAB — MONOCYTES ABSOLUTE COUNT: Monocytes:NCnc:Pt:Bld:Qn:Automated count: 0.1 — ABNORMAL LOW

## 2019-07-22 LAB — AMYLASE: Amylase:CCnc:Pt:Ser/Plas:Qn:: 114 — ABNORMAL HIGH

## 2019-07-22 MED ADMIN — tbo-filgrastim (GRANIX) injection 300 mcg: 300 ug | SUBCUTANEOUS | @ 20:00:00 | Stop: 2019-07-22

## 2019-07-22 NOTE — Unmapped (Signed)
Per providers orders, Granix was administered.  Patient tolerated it well with no complication.  See MAR for administration info.

## 2019-07-22 NOTE — Unmapped (Signed)
WBC 0.6; Pt report no fever, nor other symptoms reports feeling fine. Advised to go to the ER if fever develops and HOLD Myfortic and valcyte and come to clinic for grannix injection today. Pt verbalize understanding.

## 2019-07-23 LAB — HLA DS POST TRANSPLANT
ANTI-DONOR DRW #2 MFI: 259 MFI
ANTI-DONOR HLA-A #1 MFI: 59 MFI
ANTI-DONOR HLA-A #2 MFI: 66 MFI
ANTI-DONOR HLA-B #1 MFI: 75 MFI
ANTI-DONOR HLA-B #2 MFI: 337 MFI
ANTI-DONOR HLA-C #1 MFI: 86 MFI
ANTI-DONOR HLA-C #2 MFI: 329 MFI
ANTI-DONOR HLA-DQB #1 MFI: 8 MFI
ANTI-DONOR HLA-DR #1 MFI: 212 MFI
ANTI-DONOR HLA-DR #2 MFI: 218 MFI

## 2019-07-23 LAB — FSAB CLASS 1 ANTIBODY SPECIFICITY

## 2019-07-23 LAB — DONOR HLA-A ANTIGEN #1

## 2019-07-23 LAB — FSAB CLASS 2 ANTIBODY SPECIFICITY: CLASS 2 ANTIBODIES IDENTIFIED: 1:1 {titer}

## 2019-07-23 LAB — HLA CL2 AB RESULT: Lab: POSITIVE

## 2019-07-23 LAB — HLA CL1 ANTIBODY COMM: Lab: 0

## 2019-07-23 LAB — TACROLIMUS BLOOD: Lab: 24.6

## 2019-07-23 NOTE — Unmapped (Signed)
Tacrolimus level 24.6 not a real level, Pt took medication before labs. Repeat WBC 0.6, received grannix injection yesterday.

## 2019-07-23 NOTE — Unmapped (Signed)
Received call from Dr. Trilby Leaver. He was paged with critical value.   ANC 0.3 drawn today at 1547. Message sent to primary coordinator, Worthy Rancher.

## 2019-07-26 DIAGNOSIS — Z940 Kidney transplant status: Principal | ICD-10-CM

## 2019-07-26 DIAGNOSIS — D849 Immunodeficiency, unspecified: Principal | ICD-10-CM

## 2019-07-26 DIAGNOSIS — Z9483 Pancreas transplant status: Principal | ICD-10-CM

## 2019-07-27 DIAGNOSIS — Z9483 Pancreas transplant status: Principal | ICD-10-CM

## 2019-07-27 DIAGNOSIS — Z940 Kidney transplant status: Principal | ICD-10-CM

## 2019-07-28 DIAGNOSIS — A0472 Enterocolitis due to Clostridium difficile, not specified as recurrent: Principal | ICD-10-CM

## 2019-07-28 LAB — TACROLIMUS BLOOD: Tacrolimus:MCnc:Pt:Bld:Qn:LC/MS/MS: 7.2

## 2019-07-28 LAB — SODIUM: Lab: 144

## 2019-07-28 LAB — BASIC METABOLIC PANEL
CHLORIDE: 107 mmol/L
CREATININE: 1.27 mg/dL — ABNORMAL HIGH
POTASSIUM: 4.5 mmol/L
SODIUM: 144 mmol/L

## 2019-07-28 LAB — ALBUMIN: Lab: 3.8

## 2019-07-28 LAB — CBC W/ DIFFERENTIAL
BASOPHILS ABSOLUTE COUNT: 0.3 10*9/L
BASOPHILS RELATIVE PERCENT: 0.8 %
EOSINOPHILS ABSOLUTE COUNT: 0.9 10*9/L
EOSINOPHILS RELATIVE PERCENT: 2.3 %
HEMATOCRIT: 32.9 % — ABNORMAL LOW
LYMPHOCYTES ABSOLUTE COUNT: 0.2 10*9/L — ABNORMAL LOW
LYMPHOCYTES RELATIVE PERCENT: 5.6 %
MEAN CORPUSCULAR HEMOGLOBIN CONC: 32.2 g/dL
MEAN CORPUSCULAR HEMOGLOBIN: 30 pg
MEAN CORPUSCULAR VOLUME: 93.2 fL
MEAN PLATELET VOLUME: 9.9 fL
MONOCYTES ABSOLUTE COUNT: 0.5 10*9/L
MONOCYTES RELATIVE PERCENT: 13.8 %
NEUTROPHILS ABSOLUTE COUNT: 3 10*9/L
NEUTROPHILS RELATIVE PERCENT: 77.5 %
PLATELET COUNT: 251 10*9/L
RED BLOOD CELL COUNT: 3.53 10*12/L — ABNORMAL LOW
RED CELL DISTRIBUTION WIDTH: 14.7 %
WHITE BLOOD CELL COUNT: 3.9 10*9/L

## 2019-07-28 LAB — HEPATIC FUNCTION PANEL
ALT (SGPT): 27 U/L
AST (SGOT): 26 U/L
BILIRUBIN DIRECT: 0.1 mg/dL
PROTEIN TOTAL: 6.5 g/dL

## 2019-07-28 LAB — ALT (SGPT): Lab: 27

## 2019-07-28 LAB — MAGNESIUM: Lab: 1.6

## 2019-07-28 LAB — PHOSPHORUS: Lab: 3.5

## 2019-07-28 LAB — TACROLIMUS, TROUGH: Lab: 5.6

## 2019-07-28 LAB — LIPASE: Lab: 17

## 2019-07-28 LAB — RED BLOOD CELL COUNT: Lab: 3.53 — ABNORMAL LOW

## 2019-07-28 LAB — AMYLASE: Lab: 90

## 2019-07-28 MED ORDER — VANCOMYCIN 125 MG CAPSULE
ORAL_CAPSULE | Freq: Four times a day (QID) | ORAL | 0 refills | 14.00000 days | Status: CP
Start: 2019-07-28 — End: 2019-08-11

## 2019-07-28 NOTE — Unmapped (Signed)
Pt report watery diarrhea post vancomycin completion for C-diff. Advised to restart vancomycin for 14 days.

## 2019-07-29 DIAGNOSIS — Z940 Kidney transplant status: Principal | ICD-10-CM

## 2019-07-29 DIAGNOSIS — Z9483 Pancreas transplant status: Principal | ICD-10-CM

## 2019-07-29 NOTE — Unmapped (Signed)
[facility] Refill Coordination Note    Specialty Medication(s) to be Shipped:   Transplant: Envarsus 1mg  and Envarsus 4mg     Other medication(s) to be shipped: pantoprazole 40mg      Elaine Koch, DOB: 1985-06-25  Phone: [phone] (home)       All above HIPAA information was verified with patient.     Was a Nurse, learning disability used for this call? No    Completed refill call assessment today to schedule patient's medication shipment from the [facility] [phone]).       Specialty medication(s) and dose(s) confirmed: Regimen is correct and unchanged.   Changes to medications: Randy reports no changes at this time.  Changes to insurance: No  Questions for the pharmacist: No    Confirmed patient received Welcome Packet with first shipment. The patient will receive a drug information handout for each medication shipped and additional FDA Medication Guides as required.       DISEASE/MEDICATION-SPECIFIC INFORMATION        N/A    SPECIALTY MEDICATION ADHERENCE     Medication Adherence    Patient reported X missed doses in the last month: 0  Specialty Medication: Envarsus 1mg   Patient is on additional specialty medications: Yes  Additional Specialty Medications: Envarsus 4mg   Patient Reported Additional Medication X Missed Doses in the Last Month: 0  Patient is on more than two specialty medications: No        Envarsus 1 mg: 9 days of medicine on hand   Envarsus 4 mg: 9 days of medicine on hand     SHIPPING     Shipping address confirmed in Epic.     Delivery Scheduled: Yes, Expected medication delivery date: 08/05/2019.     Medication will be delivered via UPS to the prescription address in Epic [facility].    Lorelei Pont [facility] Specialty Technician

## 2019-07-29 NOTE — Unmapped (Signed)
Client Coordination Note Report & Missed visit notification from 06/28/19 sent to HIM Suan Halter July 29, 2019 11:58 AM

## 2019-08-02 DIAGNOSIS — Z940 Kidney transplant status: Principal | ICD-10-CM

## 2019-08-02 DIAGNOSIS — Z9483 Pancreas transplant status: Principal | ICD-10-CM

## 2019-08-02 DIAGNOSIS — E0929 Drug or chemical induced diabetes mellitus with other diabetic kidney complication: Principal | ICD-10-CM

## 2019-08-02 DIAGNOSIS — D849 Immunodeficiency, unspecified: Principal | ICD-10-CM

## 2019-08-02 LAB — PHOSPHORUS: Lab: 3.1

## 2019-08-02 LAB — AMYLASE: Lab: 122 — ABNORMAL HIGH

## 2019-08-02 LAB — BASIC METABOLIC PANEL
BLOOD UREA NITROGEN: 29 mg/dL — ABNORMAL HIGH
CALCIUM: 9.7 mg/dL
CHLORIDE: 115 mmol/L — ABNORMAL HIGH
CO2: 17 mmol/L — ABNORMAL LOW
GLUCOSE RANDOM: 80 mg/dL
SODIUM: 139 mmol/L

## 2019-08-02 LAB — HEPATIC FUNCTION PANEL
ALKALINE PHOSPHATASE: 70 U/L
AST (SGOT): 26 U/L
BILIRUBIN TOTAL: 0.3 mg/dL
PROTEIN TOTAL: 7.4 g/dL

## 2019-08-02 LAB — CBC W/ DIFFERENTIAL
BASOPHILS ABSOLUTE COUNT: 0.9 10*9/L
BASOPHILS RELATIVE PERCENT: 1.5 %
EOSINOPHILS ABSOLUTE COUNT: 0.2 10*9/L
EOSINOPHILS RELATIVE PERCENT: 2.8 %
HEMATOCRIT: 37.9 % — ABNORMAL LOW
HEMOGLOBIN: 12.1 g/dL — ABNORMAL LOW
LYMPHOCYTES RELATIVE PERCENT: 4.6 %
MEAN CORPUSCULAR HEMOGLOBIN CONC: 31.9 g/dL — ABNORMAL LOW
MEAN CORPUSCULAR HEMOGLOBIN: 30 pg
MEAN CORPUSCULAR VOLUME: 93.8 fL
MEAN PLATELET VOLUME: 9.8 fL
MONOCYTES ABSOLUTE COUNT: 0.7 10*9/L
MONOCYTES RELATIVE PERCENT: 11.3 %
NEUTROPHILS ABSOLUTE COUNT: 4.9 10*9/L
NEUTROPHILS RELATIVE PERCENT: 79.8 %
PLATELET COUNT: 283 10*9/L
RED BLOOD CELL COUNT: 4.04 10*12/L — ABNORMAL LOW
WBC ADJUSTED: 6.1 10*9/L

## 2019-08-02 LAB — CHLORIDE: Lab: 115 — ABNORMAL HIGH

## 2019-08-02 LAB — HEMOGLOBIN A1C: Lab: 4.5

## 2019-08-02 LAB — MAGNESIUM: Lab: 1.7

## 2019-08-02 LAB — BILIRUBIN DIRECT: Lab: 0

## 2019-08-02 LAB — PLATELET COUNT: Lab: 283

## 2019-08-02 LAB — LIPASE: Lab: 22

## 2019-08-02 LAB — ALBUMIN: Lab: 4.3

## 2019-08-03 DIAGNOSIS — Z940 Kidney transplant status: Principal | ICD-10-CM

## 2019-08-03 DIAGNOSIS — Z9483 Pancreas transplant status: Principal | ICD-10-CM

## 2019-08-03 LAB — LIPASE: Lab: 23

## 2019-08-03 LAB — CBC W/ DIFFERENTIAL
BASOPHILS ABSOLUTE COUNT: 0.7 10*9/L
BASOPHILS RELATIVE PERCENT: 0.8 %
EOSINOPHILS ABSOLUTE COUNT: 0.2 10*9/L
HEMATOCRIT: 34.3 % — ABNORMAL LOW
HEMOGLOBIN: 11.1 g/dL — ABNORMAL LOW
LYMPHOCYTES ABSOLUTE COUNT: 0.2 10*9/L — ABNORMAL LOW
LYMPHOCYTES RELATIVE PERCENT: 2.3 %
MEAN CORPUSCULAR HEMOGLOBIN CONC: 32.4 g/dL
MEAN CORPUSCULAR HEMOGLOBIN: 30.2 pg
MEAN CORPUSCULAR VOLUME: 93.2 fL
MEAN PLATELET VOLUME: 10.2 fL
MONOCYTES ABSOLUTE COUNT: 0.6 10*9/L
MONOCYTES RELATIVE PERCENT: 7.4 %
NEUTROPHILS ABSOLUTE COUNT: 7.5 10*9/L
NEUTROPHILS RELATIVE PERCENT: 87.6 %
RED BLOOD CELL COUNT: 3.68 10*12/L — ABNORMAL LOW
RED CELL DISTRIBUTION WIDTH: 14.9 %
WHITE BLOOD CELL COUNT: 8.6 10*9/L

## 2019-08-03 LAB — BASIC METABOLIC PANEL
BLOOD UREA NITROGEN: 24 mg/dL
CALCIUM: 9.4 mg/dL
CHLORIDE: 118 mmol/L — ABNORMAL HIGH
CO2: 17 mmol/L — ABNORMAL LOW
CREATININE: 1.49 mg/dL — ABNORMAL HIGH
EGFR CKD-EPI AA FEMALE: 54 mL/min/{1.73_m2} — ABNORMAL LOW
SODIUM: 140 mmol/L

## 2019-08-03 LAB — HEMOGLOBIN A1C: Lab: 4.6

## 2019-08-03 LAB — MAGNESIUM: Lab: 1.8

## 2019-08-03 LAB — HEPATIC FUNCTION PANEL
ALKALINE PHOSPHATASE: 71 U/L
BILIRUBIN TOTAL: 0.3 mg/dL
PROTEIN TOTAL: 7.2 g/dL

## 2019-08-03 LAB — ALBUMIN: Lab: 4.1

## 2019-08-03 LAB — AMYLASE: Lab: 113 — ABNORMAL HIGH

## 2019-08-03 LAB — WHITE BLOOD CELL COUNT: Lab: 8.6

## 2019-08-03 LAB — EGFR CKD-EPI AA MALE: Lab: 0

## 2019-08-03 LAB — TACROLIMUS, TROUGH: Lab: 7.8

## 2019-08-03 LAB — BILIRUBIN TOTAL: Lab: 0.3

## 2019-08-03 LAB — PHOSPHORUS: Lab: 3.3

## 2019-08-03 MED ORDER — MYCOPHENOLATE SODIUM 180 MG TABLET,DELAYED RELEASE
ORAL_TABLET | Freq: Two times a day (BID) | ORAL | 11 refills | 30.00000 days
Start: 2019-08-03 — End: 2020-08-02

## 2019-08-03 NOTE — Unmapped (Signed)
Pt cr 1.49 reports not drinking well since starting back at work. Confirms she restarted myfortic 180 mg BID

## 2019-08-04 DIAGNOSIS — Z9483 Pancreas transplant status: Principal | ICD-10-CM

## 2019-08-04 DIAGNOSIS — Z940 Kidney transplant status: Principal | ICD-10-CM

## 2019-08-04 MED ORDER — MYCOPHENOLATE SODIUM 180 MG TABLET,DELAYED RELEASE
ORAL_TABLET | Freq: Two times a day (BID) | ORAL | 11 refills | 30 days | Status: CP
Start: 2019-08-04 — End: 2020-08-03
  Filled 2019-08-04: qty 60, 30d supply, fill #0

## 2019-08-04 MED FILL — PANTOPRAZOLE 40 MG TABLET,DELAYED RELEASE: 30 days supply | Qty: 30 | Fill #2 | Status: AC

## 2019-08-04 MED FILL — ENVARSUS XR 1 MG TABLET,EXTENDED RELEASE: 30 days supply | Qty: 60 | Fill #1

## 2019-08-04 MED FILL — ENVARSUS XR 4 MG TABLET,EXTENDED RELEASE: 30 days supply | Qty: 150 | Fill #1 | Status: AC

## 2019-08-04 MED FILL — PANTOPRAZOLE 40 MG TABLET,DELAYED RELEASE: ORAL | 30 days supply | Qty: 30 | Fill #2

## 2019-08-04 MED FILL — ENVARSUS XR 4 MG TABLET,EXTENDED RELEASE: 30 days supply | Qty: 150 | Fill #1

## 2019-08-04 MED FILL — MYCOPHENOLATE SODIUM 180 MG TABLET,DELAYED RELEASE: 30 days supply | Qty: 60 | Fill #0 | Status: AC

## 2019-08-04 MED FILL — ENVARSUS XR 1 MG TABLET,EXTENDED RELEASE: 30 days supply | Qty: 60 | Fill #1 | Status: AC

## 2019-08-04 NOTE — Unmapped (Signed)
[facility] Refill Coordination Note    Specialty Medication(s) to be Shipped:   Transplant:  mycophenolic acid 180mg     Other medication(s) to be shipped: n/a     Tanveer Dobberstein, DOB: August 28, 1985  Phone: [phone] (home)       All above HIPAA information was verified with patient.     Was a Nurse, learning disability used for this call? No    Completed refill call assessment today to schedule patient's medication shipment from the [facility] [phone]).       Specialty medication(s) and dose(s) confirmed: Patient reports changes to the regimen as follows: She is only taking 2 tablets daily   Changes to medications: Loria reports no changes at this time.  Changes to insurance: No  Questions for the pharmacist: No    Confirmed patient received Welcome Packet with first shipment. The patient will receive a drug information handout for each medication shipped and additional FDA Medication Guides as required.       DISEASE/MEDICATION-SPECIFIC INFORMATION        N/A    SPECIALTY MEDICATION ADHERENCE     Medication Adherence    Patient reported X missed doses in the last month: 0  Specialty Medication: Mycophenolate 180mg   Patient is on additional specialty medications: No  Informant: patient                Mycophenolate 180 mg: 1 day of medicine on hand         SHIPPING     Shipping address confirmed in Epic.     Delivery Scheduled: Yes, Expected medication delivery date: 08/05/19.  However, Rx request for refills was sent to the provider as there are none remaining.     Medication will be delivered via UPS to the prescription address in Epic [facility].    [name]   [facility] Specialty Technician

## 2019-08-05 DIAGNOSIS — E118 Type 2 diabetes mellitus with unspecified complications: Principal | ICD-10-CM

## 2019-08-05 DIAGNOSIS — Z79899 Other long term (current) drug therapy: Principal | ICD-10-CM

## 2019-08-05 DIAGNOSIS — Z940 Kidney transplant status: Principal | ICD-10-CM

## 2019-08-05 DIAGNOSIS — E559 Vitamin D deficiency, unspecified: Principal | ICD-10-CM

## 2019-08-05 DIAGNOSIS — Z9483 Pancreas transplant status: Principal | ICD-10-CM

## 2019-08-05 LAB — TACROLIMUS, TROUGH: Lab: 9.4

## 2019-08-05 NOTE — Unmapped (Signed)
Transplant Nephrology Clinic Visit      History of Present Illness    Elaine Koch is a 34 y.o. 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 She does not have evidence of donor specific antibodies as of 07/15/2019. Her most recent baseline creatinine is between 1.3-1.5, occasionally down to 1.1 or up to 1.6.    She was on hemodialysis for about 3 years prior to transplant through an AVF. Her diabetes has been complicated by retinopathy. She had pre-eclampsia during pregnancy in 2017. She also had severe anemia requiring multiple blood transfusions during pregnancy and for some time after. In spite of that she had a cPRA of 0% prior to transplant.    She was admitted from 3/21 through 05/11/19 for the initial transplant. Her course was fairly uncomplicated. On POD 8 did have a transient bump in her creatinine (1.39 to 1.57) and amylase (76 to 112), came down to 1.52 and 89 with fluids. [place] of both kidney and pancreas showed patent vessels and normal RIs. After discharge, she had some sugars in the 120s. On 4/2 reported BP 88/58 so amlodipine was held. At surgery visit 4/8 reported loose stools, which she had pre-transplant. Reglan was decreased. BP 80s/40s so all antihypertensives were held. At visit 4/15 pressures remained low so midodrine 5 mg daily was started, reglan further decreased. Her amylase was 467 at that visit so prednisone 5 mg was started since tacrolimus was sub-therapeutic, with plans to taper off once tac stable and pancreatic enzymes normal. At nephrology visit 4/20, amylase down to 179 and tacrolimus level better on TID dosing. Ureteral stent removed 06/10/19. At visit 5/25 planned transition to Envarsus given high tacrolimus requirement. Amylase remained elevated but stable at 149. Blood pressure improved and was no longer hypotensive.     She called the on-call coordinator on 07/11/19 complaining of headache, nausea and vomiting, diarrhea the night prior. No fever. Was able to keep morning meds down. Recommended bland diet, push fluids. On 6/1 called to report BP 143/110 with chest pain, headache and vomiting and was referred to ED. She was admitted from 6/1 through 07/16/19. On admission she was noted to have a creatinine of 3.22 (from 1.19 on 5/28) in the setting of a tacrolimus level of 27.9. Tacrolimus was held and level came down. She was hypotensive and given IVF. She was found to be C.diff positive and started on vancomycin with plans for 10 day course. Creatinine to 1.32 on discharge. Wbc 0.8 on discharge with ANC 0.5. CMV negative on 6/1. Was discharged on Myfortic 720 mg BID.    On 07/20/19 she was noted to have a wbc < 0.6 and ANC < 0.2. She reported no symptoms and was instructed to hold Myfortic and Valcyte on 6/10 and received a dose of Granix. On 6/10 wbc to 0.6 with ANC 0.3. On 6/16 she reported diarrhea returned after stopping vancomycin, course was extended for an additional 14 days. Wbc improved to 3.9 on 6/14 and then 6.1 on 6/17 so Myfortic restarted at 180 mg BID. Creatinine to 1.56 on 6/17 and 1.49 on 6/21, reported had not been drinking as much since back at work. Wbc 8.6 on 6/21 with ANC 7.5. Most recent lipase has been 90-122.    She presents today for follow up.    She has generally been feeling well. Her diarrhea is gone since restarting vancomycin. Notes blood pressures in the 130s/80s at home.    She  is back to work now, has 2 sons aged 3 and another about to turn 64.    She specifically denies fever, myalgias, upper respiratory/GI symptoms, or change in taste/smell.    Review of Systems    Otherwise on review of systems patient denies fever or chills, chest pain, SOB, PND or orthopnea, lower extremity edema. Denies N/V/abdominal pain. No dysuria, hematuria or difficulty voiding. Bowel movements normal. Denies joint pain or rash. All other systems are reviewed and are negative.    Medications    Current Outpatient Medications   Medication Sig Dispense Refill   ??? acetaminophen (TYLENOL) 500 MG tablet Take 1-2 tablets (500-1,000 mg total) by mouth every six (6) hours as needed for pain or fever greater than 100.81F (38C). 100 tablet 0   ??? aspirin (ECOTRIN) 81 MG tablet Take 1 tablet (81 mg total) by mouth daily. 30 tablet 11   ??? biotin 5 mg tablet Take 1 tablet (5 mg total) by mouth daily. 30 tablet 11   ??? cholecalciferol, vitamin D3-125 mcg, 5,000 unit,, 125 mcg (5,000 unit) capsule Take 1 capsule (125 mcg total) by mouth daily. 100 capsule 10   ??? famotidine (PEPCID) 20 MG tablet Take 1 tablet (20 mg total) by mouth every morning before breakfast. 30 tablet 1   ??? multivitamin, prenatal, folic acid-iron, 27-1 mg Tab Take 1 tablet by mouth daily.     ??? mycophenolate (MYFORTIC) 180 MG EC tablet Take 2 tablets (360 mg total) by mouth Two (2) times a day. 120 tablet 11   ??? predniSONE (DELTASONE) 5 MG tablet Take 1 tablet (5 mg total) by mouth daily. 30 tablet 11   ??? sodium bicarbonate 650 mg tablet Take 2 tablets (1,300 mg total) by mouth Three (3) times a day. 180 tablet 3   ??? sulfamethoxazole-trimethoprim (BACTRIM) 400-80 mg per tablet Take 1 tablet (80 mg of trimethoprim total) by mouth Every Monday, Wednesday, and Friday. 12 tablet 2   ??? tacrolimus (ENVARSUS XR) 1 mg Tb24 extended release tablet Take 2 tablets (2mg ) by mouth once daily in addition to 4 mg tablets for total dose of 22 mg daily. 60 tablet 11   ??? tacrolimus (ENVARSUS XR) 4 mg Tb24 extended release tablet Take 5 tablets (20mg ) by mouth once daily in addition to 1 mg tablets for total dose of 22 mg daily. 150 tablet 11   ??? vancomycin (VANCOCIN) 125 MG capsule Take 1 capsule (125 mg total) by mouth Four (4) times a day for 14 days. 56 capsule 0   ??? vancomycin (VANCOCIN) 125 MG capsule Vancomycin taper: 125 mg orally four times daily for 10 days ;125 mg orally once every 2 days for 12 days ;125 mg orally once every 3 days for 12 days; 125 mg orally once every 4 days for 12 days ;125 mg orally once every 5 days for 10 days;125 mg orally once every 6 days for 12 days; 125 mg orally once every 7 days for 28 days 100 capsule 0     No current facility-administered medications for this visit.       Physical Exam    BP 136/84 (BP Site: L Arm, BP Position: Sitting, BP Cuff Size: Medium)  - Pulse 85  - Temp 35.8 ??C (96.5 ??F) (Temporal)  - Wt 74.8 kg (165 lb)  - BMI 27.49 kg/m??   General: Patient is a pleasant female in no apparent distress.  Eyes: Sclera anicteric.  ENT: Mask in place.  Neck: Supple without LAD/JVD/bruits.  Lungs: Clear to auscultation bilaterally, no wheezes/rales/rhonchi.  Cardiovascular: Regular rate and rhythm without murmurs, rubs or gallops.  Abdomen: Soft, notender/nondistended. Positive bowel sounds. No tenderness over the graft.  Extremities: Without edema, joints without evidence of synovitis.  Skin: Without rash.  Neurological: Grossly nonfocal.  Psychiatric: Mood and affect appropriate.    Laboratory Results    Results for orders placed or performed in visit on 08/06/19   Urine Culture    Specimen: Clean Catch; Urine   Result Value Ref Range    Urine Culture, Comprehensive Mixed Urogenital Flora    Protein/Creatinine Ratio, Urine   Result Value Ref Range    Creat U 55.1 mg/dL    Protein, Ur 95.2 mg/dL    Protein/Creatinine Ratio, Urine 0.225    Urinalysis   Result Value Ref Range    Color, UA Yellow     Clarity, UA Clear     Specific Gravity, UA 1.020 1.005 - 1.030    pH, UA 5.5 5.0 - 9.0    Leukocyte Esterase, UA Negative Negative    Nitrite, UA Negative Negative    Protein, UA Negative Negative    Glucose, UA Negative Negative    Ketones, UA Negative Negative    Urobilinogen, UA 0.2 mg/dL 0.2 - 2.0 mg/dL    Bilirubin, UA Negative Negative    Blood, UA Negative Negative    RBC, UA <1 <4 /HPF    WBC, UA 2 0 - 5 /HPF    Squam Epithel, UA 3 0 - 5 /HPF    Bacteria, UA None Seen None Seen /HPF   CMV DNA, quantitative, PCR   Result Value Ref Range CMV Viral Ld Not Detected Not Detected    CMV Quant      CMV Quant Log10      CMV Comment     Vitamin D 25 Hydroxy (25OH D2 + D3)   Result Value Ref Range    Vitamin D Total (25OH) 29.3 20.0 - 80.0 ng/mL   Parathyroid Hormone (PTH)   Result Value Ref Range    PTH 101.0 (H) 12.0 - 72.0 pg/mL    Calcium 9.6 8.5 - 10.2 mg/dL   BK Virus Quantitative PCR, Blood   Result Value Ref Range    BK Blood Result Not Detected Not Detected    BK Blood Quant      BK Blood Log(10)      Bk Blood Comment     Lipid Panel   Result Value Ref Range    Triglycerides 52 0 - 150 mg/dL    Cholesterol 841 <=324 mg/dL    HDL 67 (H) 40 - 60 mg/dL    LDL Calculated 62 40 - 100 mg/dL    VLDL Cholesterol Cal 10.4 8 - 32 mg/dL    Chol/HDL Ratio 2.1 1.0 - 4.5    Non-HDL Cholesterol 72 70 - 130 mg/dL    FASTING No    Hemoglobin A1c   Result Value Ref Range    Hemoglobin A1C 4.8 4.8 - 5.6 %    Estimated Average Glucose 91 mg/dL   Comprehensive Metabolic Panel   Result Value Ref Range    Sodium 142 135 - 145 mmol/L    Potassium 5.3 (H) 3.5 - 5.0 mmol/L    Chloride 113 (H) 98 - 107 mmol/L    Anion Gap 7 7 - 15 mmol/L    CO2 22.0 22.0 - 30.0 mmol/L    BUN 30 (H) 7 - 21 mg/dL  Creatinine 1.27 (H) 0.60 - 1.00 mg/dL    BUN/Creatinine Ratio 24     EGFR CKD-EPI Non-African American, Female 56 (L) >=60 mL/min/1.39m2    EGFR CKD-EPI African American, Female 65 >=60 mL/min/1.22m2    Glucose 85 70 - 179 mg/dL    Calcium 9.7 8.5 - 16.1 mg/dL    Albumin 4.1 3.5 - 5.0 g/dL    Total Protein 7.3 6.5 - 8.3 g/dL    Total Bilirubin 0.6 0.0 - 1.2 mg/dL    AST 37 14 - 38 U/L    ALT 51 (H) <35 U/L    Alkaline Phosphatase 78 38 - 126 U/L       Assessment and Plan    1. Status post renal transplant. Her creatinine today is 1.27 and within her most recent baseline. Unfortunately an Envarsus trough was not sent today, her level of 9.4 on 6/21 was within goal of 8-10. Will continue current immunosuppression.    2. Status post pancreas transplant. She has had a mildly elevated lipase since transplant. Peaked at 467 on 05/27/19, was not sent today. On 08/02/19 amylase was mildly elevated at 113, lipase normal at 23. Will get with next labs. Hgb A1c reassuring today at 4.8%.    3. Leukopenia. Wbc remains normal at 5.2     4. History of hypertension. Blood pressure reasonable off meds, may consider adding ACEi/ARB at some point.    5. C.diff. Diarrhea resolved with restart of vancomycin. Contacted transplant ID and they would like to do a taper as follows:  - 125 mg orally four times daily for 10 days   - 125 mg orally once every 2 days for 12 days   - 125 mg orally once every 3 days for 12 days   - 125 mg orally once every 4 days for 12 days   - 125 mg orally once every 5 days for 10 days   - 125 mg orally once every 6 days for 12 days   - 125 mg orally once every 7 days for 28 days     6. Health maintenance. She received a flu shot this fall. She has had a Pnemovax vaccine, needs Prevnar 13. Received her first COVID vaccine dose prior to transplant, got her second dose today in clinic.    We discussed that while we continue to recommend the COVID vaccine for all eligible patients, we know that kidney transplant patients do not respond as strongly due to their immunosuppression, so she should continue to follow the CDC guidelines for patients who are not vaccinated. I would also encourage all household members and close contacts that are eligible for vaccination to get vaccinated. We discussed the importance of ongoing social distancing, wearing a mask outside the home, and hand washing/sanitizing.     7. Will see patient back in 6 weeks, or sooner if needed. She can decrease lab frequency to once weekly.

## 2019-08-06 ENCOUNTER — Ambulatory Visit: Admit: 2019-08-06 | Discharge: 2019-08-06 | Payer: MEDICARE

## 2019-08-06 ENCOUNTER — Ambulatory Visit: Admit: 2019-08-06 | Discharge: 2019-08-06 | Payer: MEDICARE | Attending: Nephrology | Primary: Nephrology

## 2019-08-06 DIAGNOSIS — E559 Vitamin D deficiency, unspecified: Principal | ICD-10-CM

## 2019-08-06 DIAGNOSIS — Z9483 Pancreas transplant status: Secondary | ICD-10-CM

## 2019-08-06 DIAGNOSIS — Z940 Kidney transplant status: Principal | ICD-10-CM

## 2019-08-06 DIAGNOSIS — Z79899 Other long term (current) drug therapy: Principal | ICD-10-CM

## 2019-08-06 DIAGNOSIS — E118 Type 2 diabetes mellitus with unspecified complications: Principal | ICD-10-CM

## 2019-08-06 LAB — LIPID PANEL
CHOLESTEROL: 139 mg/dL (ref <=200)
HDL CHOLESTEROL: 67 mg/dL — ABNORMAL HIGH (ref 40–60)
LDL CHOLESTEROL CALCULATED: 62 mg/dL (ref 40–100)
VLDL CHOLESTEROL CAL: 10.4 mg/dL (ref 8–32)

## 2019-08-06 LAB — COMPREHENSIVE METABOLIC PANEL
ALBUMIN: 4.1 g/dL (ref 3.5–5.0)
ALKALINE PHOSPHATASE: 71 U/L
ALKALINE PHOSPHATASE: 78 U/L (ref 38–126)
ALT (SGPT): 51 U/L — ABNORMAL HIGH (ref <35)
ANION GAP: 7 mmol/L (ref 7–15)
AST (SGOT): 27 U/L
AST (SGOT): 37 U/L (ref 14–38)
BILIRUBIN TOTAL: 0.3 mg/dL
BILIRUBIN TOTAL: 0.6 mg/dL (ref 0.0–1.2)
BLOOD UREA NITROGEN: 24 mg/dL
BLOOD UREA NITROGEN: 30 mg/dL — ABNORMAL HIGH (ref 7–21)
BUN / CREAT RATIO: 24
CALCIUM: 9.4 mg/dL
CALCIUM: 9.7 mg/dL (ref 8.5–10.2)
CHLORIDE: 118 mmol/L — ABNORMAL HIGH
CHLORIDE: 113 mmol/L — ABNORMAL HIGH (ref 98–107)
CO2: 17 mmol/L — ABNORMAL LOW
CO2: 22 mmol/L (ref 22.0–30.0)
CREATININE: 1.49 mg/dL — ABNORMAL HIGH
CREATININE: 1.27 mg/dL — ABNORMAL HIGH (ref 0.60–1.00)
EGFR CKD-EPI AA FEMALE: 54 mL/min/{1.73_m2} — ABNORMAL LOW
EGFR CKD-EPI AA FEMALE: 65 mL/min/{1.73_m2} (ref >=60)
EGFR CKD-EPI NON-AA FEMALE: 46 mL/min/{1.73_m2} — ABNORMAL LOW
EGFR CKD-EPI NON-AA FEMALE: 56 mL/min/{1.73_m2} — ABNORMAL LOW (ref >=60)
GLUCOSE RANDOM: 76 mg/dL
GLUCOSE RANDOM: 85 mg/dL (ref 70–179)
POTASSIUM: 4.9 mmol/L
POTASSIUM: 5.3 mmol/L — ABNORMAL HIGH (ref 3.5–5.0)
PROTEIN TOTAL: 7.2 g/dL
PROTEIN TOTAL: 7.3 g/dL (ref 6.5–8.3)
SODIUM: 140 mmol/L

## 2019-08-06 LAB — URINALYSIS
BACTERIA: NONE SEEN /HPF
BILIRUBIN UA: NEGATIVE
BLOOD UA: NEGATIVE
GLUCOSE UA: NEGATIVE
KETONES UA: NEGATIVE
NITRITE UA: NEGATIVE
PH UA: 5.5 (ref 5.0–9.0)
PROTEIN UA: NEGATIVE
SPECIFIC GRAVITY UA: 1.02 (ref 1.005–1.030)
SQUAMOUS EPITHELIAL: 3 /HPF (ref 0–5)
UROBILINOGEN UA: 0.2
WBC UA: 2 /HPF (ref 0–5)

## 2019-08-06 LAB — CBC W/ DIFFERENTIAL
BASOPHILS RELATIVE PERCENT: 0.8 %
EOSINOPHILS ABSOLUTE COUNT: 163 10*9/L
EOSINOPHILS RELATIVE PERCENT: 1.9 %
HEMATOCRIT: 34.3 % — ABNORMAL LOW
HEMOGLOBIN: 11.1 g/dL — ABNORMAL LOW
LYMPHOCYTES ABSOLUTE COUNT: 198 10*9/L — ABNORMAL LOW
LYMPHOCYTES RELATIVE PERCENT: 2.3 %
MEAN CORPUSCULAR HEMOGLOBIN: 30.2 pg
MEAN CORPUSCULAR VOLUME: 93.2 fL
MEAN PLATELET VOLUME: 10.2 fL
MONOCYTES ABSOLUTE COUNT: 636 10*9/L
MONOCYTES RELATIVE PERCENT: 7.4 %
NEUTROPHILS ABSOLUTE COUNT: 7534 10*9/L
NEUTROPHILS RELATIVE PERCENT: 87.6 %
PLATELET COUNT: 287 10*9/L
RED BLOOD CELL COUNT: 3.68 10*12/L — ABNORMAL LOW
RED CELL DISTRIBUTION WIDTH: 14.9 %

## 2019-08-06 LAB — MEAN CORPUSCULAR HEMOGLOBIN CONC: Lab: 32.4

## 2019-08-06 LAB — CBC W/ AUTO DIFF
BASOPHILS RELATIVE PERCENT: 0.7 %
EOSINOPHILS ABSOLUTE COUNT: 0.1 10*9/L (ref 0.0–0.7)
EOSINOPHILS RELATIVE PERCENT: 2.5 %
HEMATOCRIT: 35.2 % (ref 35.0–44.0)
HEMOGLOBIN: 11.4 g/dL — ABNORMAL LOW (ref 12.0–15.5)
LYMPHOCYTES ABSOLUTE COUNT: 0.2 10*9/L — ABNORMAL LOW (ref 0.7–4.0)
LYMPHOCYTES RELATIVE PERCENT: 4.2 %
MEAN CORPUSCULAR HEMOGLOBIN CONC: 32.4 g/dL (ref 30.0–36.0)
MEAN CORPUSCULAR HEMOGLOBIN: 29.6 pg (ref 26.0–34.0)
MEAN CORPUSCULAR VOLUME: 91.4 fL (ref 82.0–98.0)
MEAN PLATELET VOLUME: 7.7 fL (ref 7.0–10.0)
MONOCYTES ABSOLUTE COUNT: 0.4 10*9/L (ref 0.1–1.0)
NEUTROPHILS ABSOLUTE COUNT: 4.4 10*9/L (ref 1.7–7.7)
NEUTROPHILS RELATIVE PERCENT: 84.6 %
NUCLEATED RED BLOOD CELLS: 0 /100{WBCs} (ref <=4)
PLATELET COUNT: 283 10*9/L (ref 150–450)
RED BLOOD CELL COUNT: 3.85 10*12/L — ABNORMAL LOW (ref 3.90–5.03)
RED CELL DISTRIBUTION WIDTH: 16.4 % — ABNORMAL HIGH (ref 12.0–15.0)
WBC ADJUSTED: 5.2 10*9/L (ref 3.5–10.5)

## 2019-08-06 LAB — CHOLESTEROL/HDL RATIO SCREEN: Lab: 2.1

## 2019-08-06 LAB — HEMOGLOBIN A1C
HEMOGLOBIN A1C: 4.6 %
Lab: 4.6

## 2019-08-06 LAB — RBC UA: Erythrocytes:Naric:Pt:Urine sed:Qn:Microscopy.light.HPF: <1

## 2019-08-06 LAB — SODIUM: Lab: 140

## 2019-08-06 LAB — LIPASE: Lab: 23

## 2019-08-06 LAB — PARATHYROID HORMONE INTACT: Parathyrin.intact:MCnc:Pt:Ser/Plas:Qn:: 101 — ABNORMAL HIGH

## 2019-08-06 LAB — TACROLIMUS, TROUGH: Lab: 9.4

## 2019-08-06 LAB — AST (SGOT): Aspartate aminotransferase:CCnc:Pt:Ser/Plas:Qn:: 37

## 2019-08-06 LAB — MAGNESIUM: Lab: 1.8

## 2019-08-06 LAB — PHOSPHORUS: Lab: 3.3

## 2019-08-06 LAB — PROTEIN/CREAT RATIO, URINE: Protein/Creatinine:MRto:Pt:Urine:Qn:: 0.225

## 2019-08-06 LAB — PARATHYROID HOMONE (PTH): CALCIUM: 9.6 mg/dL (ref 8.5–10.2)

## 2019-08-06 LAB — MONOCYTES RELATIVE PERCENT: Monocytes/100 leukocytes:NFr:Pt:Bld:Qn:Automated count: 8

## 2019-08-06 MED ORDER — SODIUM BICARBONATE 650 MG TABLET
ORAL_TABLET | Freq: Three times a day (TID) | ORAL | 3 refills | 30.00000 days | Status: CP
Start: 2019-08-06 — End: 2020-08-05
  Filled 2019-08-11: qty 180, 30d supply, fill #0

## 2019-08-06 MED ORDER — MYCOPHENOLATE SODIUM 180 MG TABLET,DELAYED RELEASE
ORAL_TABLET | Freq: Two times a day (BID) | ORAL | 11 refills | 30.00000 days | Status: CP
Start: 2019-08-06 — End: 2020-08-05
  Filled 2019-08-18: qty 120, 30d supply, fill #0

## 2019-08-06 MED ORDER — SULFAMETHOXAZOLE 400 MG-TRIMETHOPRIM 80 MG TABLET
ORAL_TABLET | ORAL | 2 refills | 28 days | Status: CP
Start: 2019-08-06 — End: 2019-11-03
  Filled 2019-08-18: qty 12, 28d supply, fill #0

## 2019-08-06 MED ORDER — FAMOTIDINE 20 MG TABLET
ORAL_TABLET | Freq: Every day | ORAL | 1 refills | 30.00000 days | Status: CP
Start: 2019-08-06 — End: 2020-08-05
  Filled 2019-08-09: qty 30, 30d supply, fill #0

## 2019-08-06 NOTE — Unmapped (Signed)
AOBP performed left arm, medium cuff.  Average - 136/84, p 85   1st reading - 140/83, p 87   2nd reading - 131/86, p 85    3rd reading- 137/83, p 84

## 2019-08-06 NOTE — Unmapped (Signed)
Clinical Assessment Needed For: Dose Change  Medication: Mycophenolate  Last Fill Date: 08/04/2019  Copay $0  Was previous dose already scheduled to fill: No    Notes to Pharmacist: N/A

## 2019-08-06 NOTE — Unmapped (Signed)
[facility] CLINIC PHARMACY NOTE  08/06/2019   Elaine Koch  [id number]    Medication changes today:   1. Increase Myfortic to 360 mg BID  2. Increase sodium bicarbonate to 1300 mg TID  3. Stop pantoprazole  4. Start famotidine 20 mg daily     Education/Adherence tools provided today:  1.provided updated medication list  2.  provided additional education on immunosuppression and transplant related medications including reviewing indications of medications, dosing and side effects    Follow up items:  1. goal of understanding indications and dosing of immunosuppression medications   2. Consider stopping prednisone once Myfortic dose back to goal  3. IUD    Next visit with pharmacy in 1 month  ____________________________________________________________________    Elaine Koch is a 34 y.o. 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     Other PMH significant for hypertension, sickle cell trait positive, retinopathy, preeclampsia     Interval history: Admitted 6/1-6/4 w/HA, elevated BP, vomiting found to be C diff + and was started on PO vancomycin.  She developed neutropenia after admission and was treated with 1 dose of G-CSF on 07/22/19.  She completed her 10d course of vancomycin on 6/12 and watery diarrhea resumed so she restarted PO vancomycin on 6/18.    Seen by pharmacy today for: medication management and pill box fill and adherence education; last seen by pharmacy 1 months ago     CC:  Patient complains of  weight gain with prednisone    There were no vitals filed for this visit.    Allergies   Allergen Reactions   ??? Iodinated Contrast Media Swelling, Rash and Other (See Comments)     Burning, warmth throughout body and tingling.  Throat swelling.  Treated with benadryl and symptoms improved.  Has not had contrast since then (2013 or 2014).   ??? Nickel Rash   ??? Propranolol Swelling   ??? Eye Irrigating Solution [Ophthalmic Irrigation Solution] Other (See Comments)     Contrast dye (name?) used in eyes caused hot feeling in face, reversed with Benadryl.    ??? Iodine      Other reaction(s): Skin Rash   ??? Naltrexone Rash   ??? Uni-Cortrom Rash       Medications reviewed in EPIC medication station and updated today by the clinical pharmacist practitioner.    Outpatient Encounter Medications as of 08/06/2019   Medication Sig Dispense Refill   ??? acetaminophen (TYLENOL) 500 MG tablet Take 1-2 tablets (500-1,000 mg total) by mouth every six (6) hours as needed for pain or fever greater than 100.59F (38C). 100 tablet 0   ??? aspirin (ECOTRIN) 81 MG tablet Take 1 tablet (81 mg total) by mouth daily. 30 tablet 11   ??? biotin 5 mg tablet Take 1 tablet (5 mg total) by mouth daily. 30 tablet 11   ??? cholecalciferol, vitamin D3-125 mcg, 5,000 unit,, 125 mcg (5,000 unit) capsule Take 1 capsule (125 mcg total) by mouth daily. 100 capsule 10   ??? multivitamin, prenatal, folic acid-iron, 27-1 mg Tab Take 1 tablet by mouth daily.     ??? mycophenolate (MYFORTIC) 180 MG EC tablet Take 1 tablet (180 mg total) by mouth Two (2) times a day. 60 tablet 11   ??? pantoprazole (PROTONIX) 40 MG tablet Take 1 tablet (40 mg total) by mouth daily. 30 tablet 11   ??? predniSONE (DELTASONE) 5 MG tablet Take 1 tablet (5 mg total) by  mouth daily. 30 tablet 11   ??? sodium bicarbonate 650 mg tablet Take 1 tablet (650 mg total) by mouth Three (3) times a day. 100 tablet 11   ??? tacrolimus (ENVARSUS XR) 1 mg Tb24 extended release tablet Take 2 tablets (2mg ) by mouth once daily in addition to 4 mg tablets for total dose of 22 mg daily. 60 tablet 11   ??? tacrolimus (ENVARSUS XR) 4 mg Tb24 extended release tablet Take 5 tablets (20mg ) by mouth once daily in addition to 1 mg tablets for total dose of 22 mg daily. 150 tablet 11   ??? vancomycin (VANCOCIN) 125 MG capsule Take 1 capsule (125 mg total) by mouth Four (4) times a day for 14 days. 56 capsule 0     No facility-administered encounter medications on file as of 08/06/2019.     Induction agent : alemtuzumab    CURRENT IMMUNOSUPPRESSION: Envarsus 22 mg daily  prograf/Envarsus/cyclosporine goal: 8-10   myfortic180 mg PO bid per protocol (lower dose 2/2 neutropenia 6/10)   prednisone 5mg  daily (started 05/27/19 given elevated pancreatic enzymes in setting of low tac levels)    Patient complains of mild HA a couple days per week nd weight gain w/prednisone    IMMUNOSUPPRESSION DRUG LEVELS:  Lab Results   Component Value Date    Tacrolimus, Trough 9.4 08/02/2019    Tacrolimus, Trough 7.8 07/29/2019    Tacrolimus, Trough 5.6 07/26/2019     No results found for: CYCLO  No results found for: EVEROLIMUS  No results found for: SIROLIMUS    Envarsus level is accurate 24 hour trough    Graft function: above baseline - likely 2/2 not drinking enough water  DSA: ntd  Zero hour biopsy: no abnormalities  Biopsies to date: ntd  UPC: 0.125 on 07/14/19  WBC/ANC:  wnl    Plan: Will increase Myfortic to 360 mg BID.  Per Dr. Matilde Haymaker, once pancreatic enzymes normalize and tac level at goal can consider stopping prednisone.  Continue to monitor.    OI Prophylaxis:   CMV Status: D+/ R+, moderate risk . CMV prophylaxis: valganciclovir 900 mg daily x 3 months per protocol complete  Lab Results   Component Value Date    CMV Quant <50 (H) 05/27/2019   Estimated Creatinine Clearance: 53.6 mL/min (A) (based on SCr of 1.49 mg/dL (H)).  PCP Prophylaxis: bactrim SS 1 tab MWF x 6 months held for hyperkalemia after recent admission  Thrush: completed in hospital  Patient is  tolerating infectious prophylaxis well    Plan: Restart Bactrim SS MWF>Continue to monitor.    +C diff (6/2): treated 6/2-12 then diarrhea recurred after stopping  Meds currently on: Vancomycin PO 125 mg QID (6/18-7/2)  Plan: Will ask ICID if they recommend taper vanc or if okk to stop    CV Prophylaxis: asa 81 mg daily  The ASCVD Risk score Denman George DC Montez Hageman, et al., 2013) failed to calculate.  Statin therapy: Not indicated; currently on no statin Plan: Continue to monitor     BP: Goal < 140/90. Clinic vitals reported above.  Home BP ranges: 130/80s   Current meds include:  Plan:   Continue to monitor.     Anemia of CKD:  H/H:   Lab Results   Component Value Date    HGB 11.1 (L) 08/02/2019     Lab Results   Component Value Date    HCT 34.3 (L) 08/02/2019     Iron panel:  No results found for: IRON, TIBC,  FERRITIN  No results found for: LABIRON    Prior ESA use: none post transplant    Plan: stable. Continue to monitor.     DM:   Lab Results   Component Value Date    A1C 4.6 08/02/2019   . Goal A1c < 7  History of Dm? Yes: T1DM  Currently on: no insulin post pancreas transplant  Home BS log: did not bring  Diet: good appetite  Exercise: not yet  Hypoglycemia: no  Plan:  No longer needs to check BG.  Continue to monitor    Fluid Intake: 50-100 oz  Plan: Continue to monitor     Electrolytes: wnl  Meds currently on: none  Plan: Continue to monitor     GI/BM: pt reports no diarrhea or constipation. No c/o nausea  Meds currently on:  MVI daily, pantoprazole 40 mg daily  Plan: Stop pantoprazole given c diff and start famotidine 20 mg daily.   Continue to monitor    Pain: pt reports occasional foot neuropathic pain (had tapered off gabapentin in June 2021)  Meds currently on: APAP PRN (uses occasionally for headache)  Plan:Continue to monitor    Bone health:   Vitamin D Level: 24.8 on 06/01/19. Goal > 30.   Last DEXA results:  none available  Current meds include: cholecalciferol 5,000 units daily  Plan: Vitamin D level  out of goal; continue supplementation.  Continue to monitor.     Women's/Men's Health:  Elaine Koch is a 34 y.o. Female of childbearing age. Patient reports no men's/women's health issues.  No longer has an IUD.  Plan: Pt will make apt for IUD placement. Continue to monitor.    Hair thinning:  Meds currently on: biotin 5 mg daily  Plan: continue to monitor    Immunizations: first vaccine received pior to transplant,   Immunization History Administered Date(s) Administered   ??? COVID-19 VACC,MRNA,(PFIZER)(PF)(IM) 04/29/2019   ??? HPV Quadrivalent (Gardasil) 05/28/2012   ??? Hepatitis B, Adult 12/10/2017   ??? Influenza Vaccine Quad (IIV4 PF) 1mo+ injectable 10/17/2015, 01/29/2019   ??? Influenza Virus Vaccine, unspecified formulation 11/12/2016, 01/31/2019   ??? PNEUMOCOCCAL POLYSACCHARIDE 23 12/20/2016   ??? TdaP 10/17/2015   Plan: pt to get second COVID vaccine today    Pharmacy preference:  [facility]    Adherence: Patient has good understanding of medications; was able to identify names/doses of immunosuppressants and OI meds with help from her mother.  Patient  does fill their own pill box on a regular basis at home.  Patient brought medication card:yes  Pill box:did not bring  Plan: provided moderate adherence counseling/intervention    Patient was reviewed with Dr. Carlene Coria who was agreement with the stated plan:     During this visit, the following was completed:   BG log data assessment  BP log data assessment  Labs ordered and evaluated  complex treatment plan >1 DS   I spent a total of 20 minutes face to face with the patient delivering clinical care and providing education/counseling.    All questions/concerns were addressed to the patient's satisfaction.  __________________________________________  Cleone Slim, PHARMD, CPP  [employer] CLINICAL PHARMACIST PRACTITIONER  PAGER [phone]

## 2019-08-06 NOTE — Unmapped (Signed)
Patient labs drawn in room prior to leaving Nephrology clinic

## 2019-08-07 LAB — HEMOGLOBIN A1C
HEMOGLOBIN A1C: 4.8 % (ref 4.8–5.6)
Hemoglobin A1c/Hemoglobin.total:MFr:Pt:Bld:Qn:: 4.8

## 2019-08-07 LAB — CMV COMMENT: Lab: 0

## 2019-08-07 LAB — CMV DNA, QUANTITATIVE, PCR

## 2019-08-09 DIAGNOSIS — Z940 Kidney transplant status: Principal | ICD-10-CM

## 2019-08-09 DIAGNOSIS — D849 Immunodeficiency, unspecified: Principal | ICD-10-CM

## 2019-08-09 DIAGNOSIS — Z9483 Pancreas transplant status: Principal | ICD-10-CM

## 2019-08-09 MED ORDER — VANCOMYCIN 125 MG CAPSULE
ORAL_CAPSULE | ORAL | 0 refills | 34 days
Start: 2019-08-09 — End: 2019-09-12

## 2019-08-09 MED FILL — FAMOTIDINE 20 MG TABLET: 30 days supply | Qty: 30 | Fill #0 | Status: AC

## 2019-08-10 DIAGNOSIS — Z940 Kidney transplant status: Principal | ICD-10-CM

## 2019-08-10 DIAGNOSIS — Z9483 Pancreas transplant status: Principal | ICD-10-CM

## 2019-08-10 LAB — VITAMIN D, TOTAL (25OH): Lab: 29.3

## 2019-08-10 NOTE — Unmapped (Signed)
[facility] - Client Coordination 6/23 and  6/24, Client missed visit report 6/23 and 6/24 sent to HIM Suan Halter August 10, 2019 11:29 AM

## 2019-08-11 MED ORDER — VANCOMYCIN 125 MG CAPSULE: capsule | 0 refills | 0 days | Status: AC

## 2019-08-11 MED FILL — SODIUM BICARBONATE 650 MG TABLET: 30 days supply | Qty: 180 | Fill #0 | Status: AC

## 2019-08-11 NOTE — Unmapped (Signed)
Pt advised to start vancomycin taper after cdiff treatment completition

## 2019-08-12 DIAGNOSIS — Z940 Kidney transplant status: Principal | ICD-10-CM

## 2019-08-12 DIAGNOSIS — Z9483 Pancreas transplant status: Principal | ICD-10-CM

## 2019-08-12 LAB — BK VIRUS QUANTITATIVE PCR, BLOOD

## 2019-08-12 LAB — BK BLOOD COMMENT: Lab: 0

## 2019-08-14 LAB — AMYLASE: Amylase:CCnc:Pt:Ser/Plas:Qn:: 314 — ABNORMAL HIGH

## 2019-08-14 LAB — EOSINOPHILS ABSOLUTE COUNT: Eosinophils:NCnc:Pt:Bld:Qn:Automated count: 0.2

## 2019-08-14 LAB — CBC W/ DIFFERENTIAL
BANDED NEUTROPHILS ABSOLUTE COUNT: 0 10*3/uL (ref 0.0–0.1)
BASOPHILS ABSOLUTE COUNT: 0 10*3/uL (ref 0.0–0.2)
BASOPHILS RELATIVE PERCENT: 1 %
EOSINOPHILS ABSOLUTE COUNT: 0.2 10*3/uL (ref 0.0–0.4)
HEMATOCRIT: 32.4 % — ABNORMAL LOW (ref 34.0–46.6)
HEMOGLOBIN: 10.9 g/dL — ABNORMAL LOW (ref 11.1–15.9)
IMMATURE GRANULOCYTES: 0 %
LYMPHOCYTES ABSOLUTE COUNT: 0.3 10*3/uL — ABNORMAL LOW (ref 0.7–3.1)
LYMPHOCYTES RELATIVE PERCENT: 5 %
MEAN CORPUSCULAR HEMOGLOBIN CONC: 33.6 g/dL (ref 31.5–35.7)
MEAN CORPUSCULAR HEMOGLOBIN: 30.1 pg (ref 26.6–33.0)
MEAN CORPUSCULAR VOLUME: 90 fL (ref 79–97)
MONOCYTES ABSOLUTE COUNT: 0.7 10*3/uL (ref 0.1–0.9)
MONOCYTES RELATIVE PERCENT: 11 %
NEUTROPHILS ABSOLUTE COUNT: 4.9 10*3/uL (ref 1.4–7.0)
NEUTROPHILS RELATIVE PERCENT: 80 %
PLATELET COUNT: 313 10*3/uL (ref 150–450)
RED BLOOD CELL COUNT: 3.62 x10E6/uL — ABNORMAL LOW (ref 3.77–5.28)
RED CELL DISTRIBUTION WIDTH: 14.9 % (ref 11.7–15.4)
WHITE BLOOD CELL COUNT: 6.1 10*3/uL (ref 3.4–10.8)

## 2019-08-14 LAB — RENAL FUNCTION PANEL
ALBUMIN: 4 g/dL (ref 3.8–4.8)
BLOOD UREA NITROGEN: 29 mg/dL — ABNORMAL HIGH (ref 6–20)
BUN / CREAT RATIO: 20 (ref 9–23)
CALCIUM: 9.7 mg/dL (ref 8.7–10.2)
CHLORIDE: 111 mmol/L — ABNORMAL HIGH (ref 96–106)
CO2: 21 mmol/L (ref 20–29)
CREATININE: 1.42 mg/dL — ABNORMAL HIGH (ref 0.57–1.00)
GFR MDRD AF AMER: 57 mL/min/{1.73_m2} — ABNORMAL LOW
GFR MDRD NON AF AMER: 49 mL/min/{1.73_m2} — ABNORMAL LOW
PHOSPHORUS, SERUM: 3.4 mg/dL (ref 3.0–4.3)
POTASSIUM: 5.1 mmol/L (ref 3.5–5.2)
SODIUM: 143 mmol/L (ref 134–144)

## 2019-08-14 LAB — MAGNESIUM: Magnesium:MCnc:Pt:Ser/Plas:Qn:: 1.9

## 2019-08-14 LAB — LIPASE: Triacylglycerol lipase:CCnc:Pt:Ser/Plas:Qn:: 48

## 2019-08-14 LAB — POTASSIUM: Potassium:SCnc:Pt:Ser/Plas:Qn:: 5.1

## 2019-08-14 NOTE — Unmapped (Signed)
Pt paged on call coordinator.    Returned page, pt states she has had headache x 3 days. She states she has had migraines in the past while on dialysis. She also states she thinks she had allergies from her son who has similar symptoms, plus cough. Pt son negative for strep, flu and covid. Advised she try Coricidin, tylenol and increase fluid intake. Pt unsure if this feels like migraine, she has seen neurologist in past. Advised pt if symptoms gets worse to go to local UC or ED.       Will forward message to primary TNC.

## 2019-08-15 LAB — TACROLIMUS BLOOD: Tacrolimus:MCnc:Pt:Bld:Qn:LC/MS/MS: 10.7

## 2019-08-16 DIAGNOSIS — Z9483 Pancreas transplant status: Principal | ICD-10-CM

## 2019-08-16 DIAGNOSIS — D849 Immunodeficiency, unspecified: Principal | ICD-10-CM

## 2019-08-16 DIAGNOSIS — Z940 Kidney transplant status: Principal | ICD-10-CM

## 2019-08-17 DIAGNOSIS — Z940 Kidney transplant status: Principal | ICD-10-CM

## 2019-08-17 DIAGNOSIS — Z9483 Pancreas transplant status: Principal | ICD-10-CM

## 2019-08-18 MED FILL — MYCOPHENOLATE SODIUM 180 MG TABLET,DELAYED RELEASE: 30 days supply | Qty: 120 | Fill #0 | Status: AC

## 2019-08-18 MED FILL — SULFAMETHOXAZOLE 400 MG-TRIMETHOPRIM 80 MG TABLET: 28 days supply | Qty: 12 | Fill #0 | Status: AC

## 2019-08-18 NOTE — Unmapped (Signed)
[facility] Refill Coordination Note    Specialty Medication(s) to be Shipped:   Transplant:  mycophenolic acid 180mg     Other medication(s) to be shipped: Bactrim generic     Elaine Koch, DOB: 1986-01-05  Phone: [phone] (home)       All above HIPAA information was verified with patient.     Was a Nurse, learning disability used for this call? No    Completed refill call assessment today to schedule patient's medication shipment from the [facility] [phone]).       Specialty medication(s) and dose(s) confirmed: Regimen is correct and unchanged.   Changes to medications: [name] reports no changes at this time.  Changes to insurance: No  Questions for the pharmacist: No    Confirmed patient received Welcome Packet with first shipment. The patient will receive a drug information handout for each medication shipped and additional FDA Medication Guides as required.       DISEASE/MEDICATION-SPECIFIC INFORMATION        N/A    SPECIALTY MEDICATION ADHERENCE     Medication Adherence    Patient reported X missed doses in the last month: 0  Specialty Medication: Mycophenolate  Patient is on additional specialty medications: No  Any gaps in refill history greater than 2 weeks in the last 3 months: no  Demonstrates understanding of importance of adherence: yes  Informant: patient  Reliability of informant: reliable  Confirmed plan for next specialty medication refill: delivery by pharmacy  Refills needed for supportive medications: not needed                Mycophenolate 180 mg: 1 day of medicine on hand         SHIPPING     Shipping address confirmed in Epic.     Delivery Scheduled: Yes, Expected medication delivery date: 08/19/2019.     Medication will be delivered via UPS to the prescription address in Epic WAM.    [name]   [facility] Specialty Technician

## 2019-08-19 DIAGNOSIS — Z9483 Pancreas transplant status: Principal | ICD-10-CM

## 2019-08-19 DIAGNOSIS — Z940 Kidney transplant status: Principal | ICD-10-CM

## 2019-08-21 LAB — CBC W/ DIFFERENTIAL
BANDED NEUTROPHILS ABSOLUTE COUNT: 0 10*3/uL (ref 0.0–0.1)
BASOPHILS ABSOLUTE COUNT: 0.1 10*3/uL (ref 0.0–0.2)
BASOPHILS RELATIVE PERCENT: 1 %
HEMATOCRIT: 37.1 % (ref 34.0–46.6)
HEMOGLOBIN: 12.1 g/dL (ref 11.1–15.9)
IMMATURE GRANULOCYTES: 0 %
LYMPHOCYTES ABSOLUTE COUNT: 0.3 10*3/uL — ABNORMAL LOW (ref 0.7–3.1)
LYMPHOCYTES RELATIVE PERCENT: 6 %
MEAN CORPUSCULAR HEMOGLOBIN CONC: 32.6 g/dL (ref 31.5–35.7)
MEAN CORPUSCULAR HEMOGLOBIN: 30.6 pg (ref 26.6–33.0)
MEAN CORPUSCULAR VOLUME: 94 fL (ref 79–97)
MONOCYTES ABSOLUTE COUNT: 0.6 10*3/uL (ref 0.1–0.9)
MONOCYTES RELATIVE PERCENT: 10 %
NEUTROPHILS ABSOLUTE COUNT: 4.4 10*3/uL (ref 1.4–7.0)
NEUTROPHILS RELATIVE PERCENT: 78 %
PLATELET COUNT: 328 10*3/uL (ref 150–450)
RED BLOOD CELL COUNT: 3.95 x10E6/uL (ref 3.77–5.28)
RED CELL DISTRIBUTION WIDTH: 14.4 % (ref 11.7–15.4)
WHITE BLOOD CELL COUNT: 5.7 10*3/uL (ref 3.4–10.8)

## 2019-08-21 LAB — RENAL FUNCTION PANEL
BLOOD UREA NITROGEN: 25 mg/dL — ABNORMAL HIGH (ref 6–20)
BUN / CREAT RATIO: 19 (ref 9–23)
CALCIUM: 10 mg/dL (ref 8.7–10.2)
CO2: 17 mmol/L — ABNORMAL LOW (ref 20–29)
CREATININE: 1.32 mg/dL — ABNORMAL HIGH (ref 0.57–1.00)
GFR MDRD AF AMER: 62 mL/min/{1.73_m2}
GFR MDRD NON AF AMER: 54 mL/min/{1.73_m2} — ABNORMAL LOW
GLUCOSE: 92 mg/dL (ref 65–99)
PHOSPHORUS, SERUM: 3.1 mg/dL (ref 3.0–4.3)
POTASSIUM: 5.3 mmol/L — ABNORMAL HIGH (ref 3.5–5.2)

## 2019-08-21 LAB — BUN / CREAT RATIO: Urea nitrogen/Creatinine:MRto:Pt:Ser/Plas:Qn:: 19

## 2019-08-21 LAB — AMYLASE: Amylase:CCnc:Pt:Ser/Plas:Qn:: 170 — ABNORMAL HIGH

## 2019-08-21 LAB — LIPASE
LIPASE: 28 U/L (ref 14–72)
Triacylglycerol lipase:CCnc:Pt:Ser/Plas:Qn:: 28

## 2019-08-21 LAB — MAGNESIUM: Magnesium:MCnc:Pt:Ser/Plas:Qn:: 1.7

## 2019-08-21 LAB — MEAN CORPUSCULAR VOLUME: Erythrocyte mean corpuscular volume:EntVol:Pt:RBC:Qn:Automated count: 94

## 2019-08-23 DIAGNOSIS — Z940 Kidney transplant status: Principal | ICD-10-CM

## 2019-08-23 DIAGNOSIS — Z9483 Pancreas transplant status: Principal | ICD-10-CM

## 2019-08-23 DIAGNOSIS — D849 Immunodeficiency, unspecified: Principal | ICD-10-CM

## 2019-08-23 MED ORDER — FIDAXOMICIN 200 MG TABLET
ORAL_TABLET | Freq: Two times a day (BID) | ORAL | 0 refills | 10 days | Status: CP
Start: 2019-08-23 — End: 2019-09-02

## 2019-08-23 NOTE — Unmapped (Signed)
Pt report C-diff related diarrhea since starting Vancoycin taper. told to resume vanc 125mg  4 times a day for c-diff and stop the taper.  new prescription for Fidaxomicin 200 mg BID was sent to local pharmacy, advised to start it and stop vanc once received and continue the vanc taper post fidaxomicin completition.

## 2019-08-24 DIAGNOSIS — Z940 Kidney transplant status: Principal | ICD-10-CM

## 2019-08-24 DIAGNOSIS — Z9483 Pancreas transplant status: Principal | ICD-10-CM

## 2019-08-24 LAB — TACROLIMUS BLOOD: Tacrolimus:MCnc:Pt:Bld:Qn:LC/MS/MS: 4.4

## 2019-08-25 NOTE — Unmapped (Signed)
[facility] Refill Coordination Note    Specialty Medication(s) to be Shipped:   Transplant: Envarsus 1mg  and Envarsus 4mg     Other medication(s) to be shipped:       Elaine Koch, DOB: September 02, 1985  Phone: [phone] (home)       All above HIPAA information was verified with patient.     Was a Nurse, learning disability used for this call? No    Completed refill call assessment today to schedule patient's medication shipment from the [facility] [phone]).       Specialty medication(s) and dose(s) confirmed: Regimen is correct and unchanged.   Changes to medications: Elaine Koch reports no changes at this time.  Changes to insurance: No  Questions for the pharmacist: No    Confirmed patient received Welcome Packet with first shipment. The patient will receive a drug information handout for each medication shipped and additional FDA Medication Guides as required.       DISEASE/MEDICATION-SPECIFIC INFORMATION        N/A    SPECIALTY MEDICATION ADHERENCE     Medication Adherence    Patient reported X missed doses in the last month: 0  Specialty Medication: envarsus 1mg   Patient is on additional specialty medications: Yes  Additional Specialty Medications: envarsus 4mg   Patient Reported Additional Medication X Missed Doses in the Last Month: 0  Patient is on more than two specialty medications: No  Informant: patient  Reliability of informant: reliable  Patient is at risk for Non-Adherence: No                envarsus 1 mg: 8 days of medicine on hand   envarsus 4 mg: 8 days of medicine on hand         SHIPPING     Shipping address confirmed in Epic.     Delivery Scheduled: Yes, Expected medication delivery date: 07/19.     Medication will be delivered via UPS to the prescription address in Epic WAM.    [name]   [facility] Specialty Technician

## 2019-08-26 DIAGNOSIS — Z9483 Pancreas transplant status: Principal | ICD-10-CM

## 2019-08-26 DIAGNOSIS — Z940 Kidney transplant status: Principal | ICD-10-CM

## 2019-08-26 NOTE — Unmapped (Signed)
[facility] - Episode Detail Report 07/26/19 - 08/22/19 sent to HIM Joice Lofts D Powell August 26, 2019 8:20 AM

## 2019-08-27 MED FILL — ENVARSUS XR 1 MG TABLET,EXTENDED RELEASE: 30 days supply | Qty: 60 | Fill #2

## 2019-08-27 MED FILL — ENVARSUS XR 4 MG TABLET,EXTENDED RELEASE: 30 days supply | Qty: 150 | Fill #2

## 2019-08-27 MED FILL — ENVARSUS XR 1 MG TABLET,EXTENDED RELEASE: 30 days supply | Qty: 60 | Fill #2 | Status: AC

## 2019-08-27 MED FILL — ENVARSUS XR 4 MG TABLET,EXTENDED RELEASE: 30 days supply | Qty: 150 | Fill #2 | Status: AC

## 2019-08-28 LAB — CBC W/ DIFFERENTIAL
BANDED NEUTROPHILS ABSOLUTE COUNT: 0 10*3/uL (ref 0.0–0.1)
BASOPHILS ABSOLUTE COUNT: 0.1 10*3/uL (ref 0.0–0.2)
BASOPHILS RELATIVE PERCENT: 1 %
EOSINOPHILS ABSOLUTE COUNT: 0.3 10*3/uL (ref 0.0–0.4)
EOSINOPHILS RELATIVE PERCENT: 5 %
HEMATOCRIT: 35.9 % (ref 34.0–46.6)
HEMOGLOBIN: 12 g/dL (ref 11.1–15.9)
IMMATURE GRANULOCYTES: 0 %
LYMPHOCYTES ABSOLUTE COUNT: 0.3 10*3/uL — ABNORMAL LOW (ref 0.7–3.1)
LYMPHOCYTES RELATIVE PERCENT: 5 %
MEAN CORPUSCULAR HEMOGLOBIN CONC: 33.4 g/dL (ref 31.5–35.7)
MEAN CORPUSCULAR HEMOGLOBIN: 31 pg (ref 26.6–33.0)
MONOCYTES ABSOLUTE COUNT: 0.6 10*3/uL (ref 0.1–0.9)
MONOCYTES RELATIVE PERCENT: 9 %
NEUTROPHILS ABSOLUTE COUNT: 5 10*3/uL (ref 1.4–7.0)
NEUTROPHILS RELATIVE PERCENT: 80 %
PLATELET COUNT: 259 10*3/uL (ref 150–450)
RED CELL DISTRIBUTION WIDTH: 14.3 % (ref 11.7–15.4)
WHITE BLOOD CELL COUNT: 6.3 10*3/uL (ref 3.4–10.8)

## 2019-08-28 LAB — RENAL FUNCTION PANEL
BLOOD UREA NITROGEN: 27 mg/dL — ABNORMAL HIGH (ref 6–20)
BUN / CREAT RATIO: 18 (ref 9–23)
CALCIUM: 9.8 mg/dL (ref 8.7–10.2)
CHLORIDE: 116 mmol/L (ref 96–106)
CO2: 18 mmol/L — ABNORMAL LOW (ref 20–29)
CREATININE: 1.49 mg/dL — ABNORMAL HIGH (ref 0.57–1.00)
GFR MDRD AF AMER: 54 mL/min/{1.73_m2} — ABNORMAL LOW
GLUCOSE: 90 mg/dL (ref 65–99)
PHOSPHORUS, SERUM: 4 mg/dL (ref 3.0–4.3)
POTASSIUM: 5.3 mmol/L — ABNORMAL HIGH (ref 3.5–5.2)
SODIUM: 146 mmol/L — ABNORMAL HIGH (ref 134–144)

## 2019-08-28 LAB — LIPASE: Triacylglycerol lipase:CCnc:Pt:Ser/Plas:Qn:: 32

## 2019-08-28 LAB — MEAN CORPUSCULAR VOLUME: Erythrocyte mean corpuscular volume:EntVol:Pt:RBC:Qn:Automated count: 93

## 2019-08-28 LAB — AMYLASE: Amylase:CCnc:Pt:Ser/Plas:Qn:: 162 — ABNORMAL HIGH

## 2019-08-28 LAB — MAGNESIUM: Magnesium:MCnc:Pt:Ser/Plas:Qn:: 1.7

## 2019-08-28 LAB — BLOOD UREA NITROGEN: Urea nitrogen:MCnc:Pt:Ser/Plas:Qn:: 27 — ABNORMAL HIGH

## 2019-08-29 LAB — TACROLIMUS BLOOD: Tacrolimus:MCnc:Pt:Bld:Qn:LC/MS/MS: 7.8

## 2019-08-30 DIAGNOSIS — Z940 Kidney transplant status: Principal | ICD-10-CM

## 2019-08-30 DIAGNOSIS — Z9483 Pancreas transplant status: Principal | ICD-10-CM

## 2019-08-30 DIAGNOSIS — D849 Immunodeficiency, unspecified: Principal | ICD-10-CM

## 2019-08-30 DIAGNOSIS — E0929 Drug or chemical induced diabetes mellitus with other diabetic kidney complication: Principal | ICD-10-CM

## 2019-08-31 ENCOUNTER — Ambulatory Visit
Admit: 2019-08-31 | Discharge: 2019-09-01 | Payer: MEDICARE | Attending: Infectious Disease | Primary: Infectious Disease

## 2019-08-31 DIAGNOSIS — Z940 Kidney transplant status: Principal | ICD-10-CM

## 2019-08-31 DIAGNOSIS — A498 Other bacterial infections of unspecified site: Principal | ICD-10-CM

## 2019-08-31 DIAGNOSIS — Z9483 Pancreas transplant status: Principal | ICD-10-CM

## 2019-08-31 MED ORDER — VANCOMYCIN 125 MG CAPSULE
ORAL_CAPSULE | 1 refills | 0 days | Status: CP
Start: 2019-08-31 — End: ?

## 2019-08-31 NOTE — Unmapped (Addendum)
IMMUNOCOMPROMISED HOST INFECTIOUS DISEASE PROGRESS NOTE    Assessment/Plan:     Elaine Koch is a 34 y.o. female who presents for C diff follow up.     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  Pertinent Co-morbidities  #T1DM  - diagnosed at age 49, c/b nephropathy and retinopathy  - 08/06/2019 A1C 4.8   ??  #Sickle cell trait    # Mild post-transplant CKD  Estimated Creatinine Clearance: 56.7 mL/min (A) (based on SCr of 1.49 mg/dL (H)).    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  Drug Intolerances  No abx intolerance  ??  Recommendations:  Complete 10 days of fidaxomicin.  Then start the following vancomycin taper, which is sequentially:   - 125 mg orally two times daily for 7 days  - 125 mg orally once daily for 7 days  - 125 mg orally once every 2 days for 12 days  - 125 mg orally once every 3 days for 12 days  - 125 mg orally once every 4 days for 12 days  - 125 mg orally once every 5 days for 10 days  - 125 mg orally once every 6 days for 12 days  - 125 mg orally once every 7 days for 28 days  Whenever/if symptoms return, go back up a level and stay there.   If she needs vancomycin once a day or more, we should consider bezlotoxumab or FMT.   Consider discontinuing famotidine (will discuss with Dr. Farrel Gobble).     Follow up PRN    Attending attestation  I saw and evaluated the patient, participating in the key portions of the service.  I reviewed the resident???s note.  I agree with the resident???s findings and plan.   Ephraim Hamburger, MD    Addendum 09/18/2019  Famotidine discontinued 09/01/2019 She completed fidaxomicin on 7/26 and called re: diarrhea relapse 8/2 -> changed to once daily fidaxomicin for suppression  Added bezlotoxumab on 09/17/2019  Referral for Dr. Fara Boros for discussion re: FMT    Subjective   The pt was seen in clinic for follow up for her C.diff. Since discharge she was on 4x daily vanco which controlled her C.diff. however, when she started the taper it started to occur. She was having 5-6 watery bowel movements. She is now on fidaxomicin on 08/27/19 for 10d course. She has no watery stools since on fidaxomicin. She does not have abdominal pain but feels more gas with bloating. She was told to restart the PO vancomycin after finishing the 10d of fidaxomicin.     She is taking bactrim MWF with no issues.     Medications:  Antimicrobials: bactrim 400/80 MWF  Prior/Current immunomodulators: prednisone 5mg  daily, tacrolimus 22mg  daily (level is 7.8 on 08/27/19), myfortic 360 daily  Other medications reviewed.    Objective     Vital signs:  BP 152/87 (BP Site: L Arm, BP Position: Sitting, BP Cuff Size: Medium) Comment: pt.denies c/o of dizziness or being lightheaded - Pulse 77  - Temp 36 ??C (96.8 ??F) (Tympanic)  - Ht 165.1 cm (  5' 5)  - Wt 78.6 kg (173 lb 3.2 oz)  - BMI 28.82 kg/m??     Physical Exam:  GEN:  looks well, no apparent distress  EYES: sclerae anicteric and non injected  IHK:[id number] good  LYMPH:no cervical LAD, no cervical or supraclavicular LAD, no cervical, supraclavicular, or axillary LAD and no cervical, supraclavicular, axillary, or inguinal LAD  CV:RRR, no abnormal heart sounds noted and no peripheral edema  PULM:normal work of breathing at rest, CTAB anteriorly and CTAB posteriorly  VF:IEPP, NT, soft, NTND and no tenderness over renal allograft  RECTAL:deferred  SKIN:no petechiae, ecchymoses or obvious rashes on clothed exam  MSK:no vertebral point tenderness, no swollen joints and full neck ROM  NEURO:no tremor noted, facial expression symmetric and moves extremities equally  PSYCH:attentive, appropriate affect, good eye contact, fluent speech    Labs:  Lab Results   Component Value Date    WBC 6.3 08/27/2019    WBC 5.7 08/20/2019    WBC 6.1 08/13/2019    HGB 12.0 08/27/2019    HCT 35.9 08/27/2019    Platelet 259 08/27/2019    Absolute Neutrophils 5.0 08/27/2019    Absolute Lymphocytes 0.3 (L) 08/27/2019    Absolute Eosinophils 0.3 08/27/2019    Sodium 146 (H) 08/27/2019    Potassium 5.3 (H) 08/27/2019    BUN 27 (H) 08/27/2019    Creatinine 1.49 (H) 08/27/2019    Creatinine 1.32 (H) 08/20/2019    Creatinine 1.42 (H) 08/13/2019    Glucose 85 08/06/2019    Magnesium 1.7 08/27/2019    Albumin 4.1 08/06/2019    Total Bilirubin 0.6 08/06/2019    AST 37 08/06/2019    ALT 51 (H) 08/06/2019    Alkaline Phosphatase 78 08/06/2019    INR 1.05 07/14/2019   normal wbc  Alc 0.3  Cr mildly elevated  Tacro level is 7.8 on 08/27/19    Microbiology:  C diff testing as noted above    Imaging:  None new

## 2019-08-31 NOTE — Unmapped (Signed)
Complete 10 days of fidaxomicin    Vancomycin taper, which is sequentially:   - 125 mg orally two times daily for 7 days  - 125 mg orally once daily for 7 days  - 125 mg orally once every 2 days for 12 days  - 125 mg orally once every 3 days for 12 days  - 125 mg orally once every 4 days for 12 days  - 125 mg orally once every 5 days for 10 days  - 125 mg orally once every 6 days for 12 days  - 125 mg orally once every 7 days for 28 days    Whenever/if symptoms return, go back up a level and stay there. Let Dr. Reynold Bowen know how things are going in MyChart.

## 2019-09-01 NOTE — Unmapped (Signed)
Pt advised to stop Pepcid and reminded to repeat weekly labs Friday

## 2019-09-02 DIAGNOSIS — Z9483 Pancreas transplant status: Principal | ICD-10-CM

## 2019-09-02 DIAGNOSIS — Z940 Kidney transplant status: Principal | ICD-10-CM

## 2019-09-03 LAB — CBC W/ DIFFERENTIAL
BASOPHILS ABSOLUTE COUNT: 0.1 10*3/uL (ref 0.0–0.2)
BASOPHILS RELATIVE PERCENT: 1 %
EOSINOPHILS ABSOLUTE COUNT: 0.2 10*3/uL (ref 0.0–0.4)
EOSINOPHILS RELATIVE PERCENT: 3 %
HEMATOCRIT: 35.6 % (ref 34.0–46.6)
IMMATURE GRANULOCYTES: 0 %
LYMPHOCYTES ABSOLUTE COUNT: 0.4 10*3/uL — ABNORMAL LOW (ref 0.7–3.1)
LYMPHOCYTES RELATIVE PERCENT: 7 %
MEAN CORPUSCULAR HEMOGLOBIN: 31 pg (ref 26.6–33.0)
MEAN CORPUSCULAR VOLUME: 93 fL (ref 79–97)
MONOCYTES ABSOLUTE COUNT: 0.4 10*3/uL (ref 0.1–0.9)
MONOCYTES RELATIVE PERCENT: 6 %
NEUTROPHILS ABSOLUTE COUNT: 4.9 10*3/uL (ref 1.4–7.0)
NEUTROPHILS RELATIVE PERCENT: 83 %
PLATELET COUNT: 222 10*3/uL (ref 150–450)
RED BLOOD CELL COUNT: 3.81 x10E6/uL (ref 3.77–5.28)
WHITE BLOOD CELL COUNT: 5.9 10*3/uL (ref 3.4–10.8)

## 2019-09-03 LAB — MONOCYTES RELATIVE PERCENT: Monocytes/100 leukocytes:NFr:Pt:Bld:Qn:Automated count: 6

## 2019-09-04 LAB — RENAL FUNCTION PANEL
BUN / CREAT RATIO: 25 — ABNORMAL HIGH (ref 9–23)
CALCIUM: 9.8 mg/dL (ref 8.7–10.2)
CHLORIDE: 111 mmol/L — ABNORMAL HIGH (ref 96–106)
CO2: 21 mmol/L (ref 20–29)
CREATININE: 1.36 mg/dL — ABNORMAL HIGH (ref 0.57–1.00)
GFR MDRD AF AMER: 60 mL/min/{1.73_m2}
GFR MDRD NON AF AMER: 52 mL/min/{1.73_m2} — ABNORMAL LOW
GLUCOSE: 87 mg/dL (ref 65–99)
PHOSPHORUS, SERUM: 3.8 mg/dL (ref 3.0–4.3)
POTASSIUM: 4.7 mmol/L (ref 3.5–5.2)
SODIUM: 144 mmol/L (ref 134–144)

## 2019-09-04 LAB — AMYLASE: Amylase:CCnc:Pt:Ser/Plas:Qn:: 150 — ABNORMAL HIGH

## 2019-09-04 LAB — MAGNESIUM: Magnesium:MCnc:Pt:Ser/Plas:Qn:: 1.4 — ABNORMAL LOW

## 2019-09-04 LAB — GFR MDRD NON AF AMER
Glomerular filtration rate/1.73 sq M.predicted.non black:ArVRat:Pt:Ser/Plas/Bld:Qn:Creatinine-based formula (CKD-EPI): 52 — ABNORMAL LOW

## 2019-09-04 LAB — LIPASE: Triacylglycerol lipase:CCnc:Pt:Ser/Plas:Qn:: 28

## 2019-09-06 DIAGNOSIS — Z9483 Pancreas transplant status: Principal | ICD-10-CM

## 2019-09-06 DIAGNOSIS — Z940 Kidney transplant status: Principal | ICD-10-CM

## 2019-09-06 DIAGNOSIS — D849 Immunodeficiency, unspecified: Principal | ICD-10-CM

## 2019-09-06 LAB — TACROLIMUS BLOOD: Tacrolimus:MCnc:Pt:Bld:Qn:LC/MS/MS: 6.3

## 2019-09-06 MED ORDER — TACROLIMUS XR 1 MG TABLET,EXTENDED RELEASE 24 HR
ORAL_TABLET | Freq: Every day | ORAL | 11 refills | 30.00000 days | Status: CP
Start: 2019-09-06 — End: ?

## 2019-09-06 MED ORDER — TACROLIMUS XR 4 MG TABLET,EXTENDED RELEASE 24 HR
ORAL_TABLET | Freq: Every day | ORAL | 11 refills | 30.00000 days | Status: CP
Start: 2019-09-06 — End: ?

## 2019-09-06 NOTE — Unmapped (Signed)
Clinical Assessment Needed For: Dose Change  Medication: Envarsus  Last Fill Date: 08/27/2019  Copay $0.00 for both 1 and 4 mg  Was previous dose already scheduled to fill: No    Notes to Pharmacist: N/A

## 2019-09-06 NOTE — Unmapped (Signed)
Pt advised to increase envarsus to 23 mg daily. Last level 6.3, will follow up on repeat labs

## 2019-09-07 DIAGNOSIS — Z940 Kidney transplant status: Principal | ICD-10-CM

## 2019-09-07 DIAGNOSIS — Z9483 Pancreas transplant status: Principal | ICD-10-CM

## 2019-09-09 DIAGNOSIS — Z940 Kidney transplant status: Principal | ICD-10-CM

## 2019-09-09 DIAGNOSIS — Z9483 Pancreas transplant status: Principal | ICD-10-CM

## 2019-09-09 NOTE — Unmapped (Signed)
[facility] Refill Coordination Note    Specialty Medication(s) to be Shipped:   Transplant: mycophenolate mofetil 180mg     Other medication(s) to be shipped: aspirin, sodium bicarb and bactrim     Elaine Koch, DOB: 06-27-1985  Phone: [phone] (home)       All above HIPAA information was verified with patient.     Was a Nurse, learning disability used for this call? No    Completed refill call assessment today to schedule patient's medication shipment from the [facility] [phone]).       Specialty medication(s) and dose(s) confirmed: Regimen is correct and unchanged.   Changes to medications: Elaine Koch reports no changes at this time.  Changes to insurance: No  Questions for the pharmacist: No    Confirmed patient received Welcome Packet with first shipment. The patient will receive a drug information handout for each medication shipped and additional FDA Medication Guides as required.       DISEASE/MEDICATION-SPECIFIC INFORMATION        N/A    SPECIALTY MEDICATION ADHERENCE     Medication Adherence    Patient reported X missed doses in the last month: 0  Specialty Medication: Mycophenolate 180mg   Patient is on additional specialty medications: No       Mycophenolate 180 mg: 8 days of medicine on hand     SHIPPING     Shipping address confirmed in Epic.     Delivery Scheduled: Yes, Expected medication delivery date: 09/16/2019.     Medication will be delivered via UPS to the prescription address in Epic WAM.    Lorelei Pont [facility] Specialty Technician

## 2019-09-11 LAB — CBC W/ DIFFERENTIAL
BANDED NEUTROPHILS ABSOLUTE COUNT: 0 10*3/uL (ref 0.0–0.1)
BASOPHILS ABSOLUTE COUNT: 0 10*3/uL (ref 0.0–0.2)
BASOPHILS RELATIVE PERCENT: 1 %
EOSINOPHILS ABSOLUTE COUNT: 0.2 10*3/uL (ref 0.0–0.4)
EOSINOPHILS RELATIVE PERCENT: 3 %
HEMATOCRIT: 36.1 % (ref 34.0–46.6)
HEMOGLOBIN: 11.5 g/dL (ref 11.1–15.9)
IMMATURE GRANULOCYTES: 0 %
LYMPHOCYTES ABSOLUTE COUNT: 0.4 10*3/uL — ABNORMAL LOW (ref 0.7–3.1)
MEAN CORPUSCULAR HEMOGLOBIN CONC: 31.9 g/dL (ref 31.5–35.7)
MEAN CORPUSCULAR HEMOGLOBIN: 29.7 pg (ref 26.6–33.0)
MEAN CORPUSCULAR VOLUME: 93 fL (ref 79–97)
MONOCYTES ABSOLUTE COUNT: 0.5 10*3/uL (ref 0.1–0.9)
MONOCYTES RELATIVE PERCENT: 10 %
NEUTROPHILS RELATIVE PERCENT: 78 %
PLATELET COUNT: 271 10*3/uL (ref 150–450)
RED BLOOD CELL COUNT: 3.87 x10E6/uL (ref 3.77–5.28)
RED CELL DISTRIBUTION WIDTH: 13.9 % (ref 11.7–15.4)
WHITE BLOOD CELL COUNT: 5.1 10*3/uL (ref 3.4–10.8)

## 2019-09-11 LAB — RENAL FUNCTION PANEL
ALBUMIN: 4 g/dL (ref 3.8–4.8)
BLOOD UREA NITROGEN: 25 mg/dL — ABNORMAL HIGH (ref 6–20)
BUN / CREAT RATIO: 19 (ref 9–23)
CALCIUM: 9.8 mg/dL (ref 8.7–10.2)
CHLORIDE: 111 mmol/L — ABNORMAL HIGH (ref 96–106)
CO2: 20 mmol/L (ref 20–29)
CREATININE: 1.34 mg/dL — ABNORMAL HIGH (ref 0.57–1.00)
GFR MDRD AF AMER: 61 mL/min/{1.73_m2}
GLUCOSE: 96 mg/dL (ref 65–99)
PHOSPHORUS, SERUM: 3.9 mg/dL (ref 3.0–4.3)
SODIUM: 143 mmol/L (ref 134–144)

## 2019-09-11 LAB — MONOCYTES ABSOLUTE COUNT: Monocytes:NCnc:Pt:Bld:Qn:Automated count: 0.5

## 2019-09-11 LAB — LIPASE: Triacylglycerol lipase:CCnc:Pt:Ser/Plas:Qn:: 25

## 2019-09-11 LAB — AMYLASE: Amylase:CCnc:Pt:Ser/Plas:Qn:: 158 — ABNORMAL HIGH

## 2019-09-11 LAB — POTASSIUM: Potassium:SCnc:Pt:Ser/Plas:Qn:: 4.8

## 2019-09-11 LAB — MAGNESIUM: Magnesium:MCnc:Pt:Ser/Plas:Qn:: 1.5 — ABNORMAL LOW

## 2019-09-12 LAB — TACROLIMUS BLOOD: Tacrolimus:MCnc:Pt:Bld:Qn:LC/MS/MS: 6.1

## 2019-09-13 DIAGNOSIS — Z940 Kidney transplant status: Principal | ICD-10-CM

## 2019-09-13 DIAGNOSIS — A498 Other bacterial infections of unspecified site: Principal | ICD-10-CM

## 2019-09-13 DIAGNOSIS — D849 Immunodeficiency, unspecified: Principal | ICD-10-CM

## 2019-09-13 DIAGNOSIS — Z9483 Pancreas transplant status: Principal | ICD-10-CM

## 2019-09-13 MED ORDER — FIDAXOMICIN 200 MG TABLET
ORAL_TABLET | Freq: Every day | ORAL | 0 refills | 30 days | Status: CP
Start: 2019-09-13 — End: 2019-10-13

## 2019-09-13 NOTE — Unmapped (Addendum)
Patient completed C-diff treatment fidaxomicin (DIFICID) 200 mg tablet on  09/06/19. Started  vancomycin taper 125mg  BID and reports diarrhea restarted this weekend 4-6 times. Reviewed with Dr Reynold Bowen and patient advised to restart Dificid 200 mg daily.

## 2019-09-14 DIAGNOSIS — Z940 Kidney transplant status: Principal | ICD-10-CM

## 2019-09-14 DIAGNOSIS — Z9483 Pancreas transplant status: Principal | ICD-10-CM

## 2019-09-15 MED FILL — ASPIRIN 81 MG TABLET,DELAYED RELEASE: ORAL | 90 days supply | Qty: 90 | Fill #1

## 2019-09-15 MED FILL — MYCOPHENOLATE SODIUM 180 MG TABLET,DELAYED RELEASE: ORAL | 30 days supply | Qty: 120 | Fill #1

## 2019-09-15 MED FILL — SODIUM BICARBONATE 650 MG TABLET: ORAL | 30 days supply | Qty: 180 | Fill #1

## 2019-09-15 MED FILL — MYCOPHENOLATE SODIUM 180 MG TABLET,DELAYED RELEASE: 30 days supply | Qty: 120 | Fill #1 | Status: AC

## 2019-09-15 MED FILL — SULFAMETHOXAZOLE 400 MG-TRIMETHOPRIM 80 MG TABLET: ORAL | 28 days supply | Qty: 12 | Fill #1

## 2019-09-15 MED FILL — SODIUM BICARBONATE 650 MG TABLET: 30 days supply | Qty: 180 | Fill #1 | Status: AC

## 2019-09-15 MED FILL — ASPIRIN 81 MG TABLET,DELAYED RELEASE: 90 days supply | Qty: 90 | Fill #1 | Status: AC

## 2019-09-15 MED FILL — SULFAMETHOXAZOLE 400 MG-TRIMETHOPRIM 80 MG TABLET: 28 days supply | Qty: 12 | Fill #1 | Status: AC

## 2019-09-16 DIAGNOSIS — Z9483 Pancreas transplant status: Principal | ICD-10-CM

## 2019-09-16 DIAGNOSIS — Z940 Kidney transplant status: Principal | ICD-10-CM

## 2019-09-16 NOTE — Unmapped (Signed)
Called patient to discuss administration of bezlotoxumab for recurrent CDI.  She is able to to come to [facility] at [facility] tomorrow.  Apt scheduled with front desk staff.

## 2019-09-17 ENCOUNTER — Ambulatory Visit: Admit: 2019-09-17 | Discharge: 2019-09-18 | Payer: MEDICARE

## 2019-09-17 MED ADMIN — bezlotoxumab (ZINPLAVA) 780 mg in sodium chloride (NS) 0.9 % 100 mL infusion: 10 mg/kg | INTRAVENOUS | @ 19:00:00 | Stop: 2019-09-17

## 2019-09-17 NOTE — Unmapped (Signed)
Patient Education        bezlotoxumab  Pronunciation:  BEZ loe TOX ue mab  Brand:  Zinplava  What is the most important information I should know about bezlotoxumab?  Before you receive bezlotoxumab, tell your doctor about all your medical conditions or allergies, and all the medicines you are using. Also make sure your doctor knows if you are pregnant or breast-feeding.  What is bezlotoxumab?  Bezlotoxumab is a monoclonal antibody. Monoclonal antibodies are made to target only certain cells in the body. Bezlotoxumab works by binding to a specific toxin produced by the Clostridium difficile bacteria, to help neutralize the toxin's effects.  Bezlotoxumab is used together with antibiotic medicine in adults with Clostridium difficile (C. difficile), an infection that can cause life-threatening diarrhea. Bezlotoxumab may help keep this infection from coming back after treatment.  Bezlotoxumab is not an antibiotic and will not treat the infection itself.   Bezlotoxumab may also be used for purposes not listed in this medication guide.  What should I discuss with my healthcare provider before receiving bezlotoxumab?  To make sure bezlotoxumab is safe for you, tell your doctor if you have:  ?? congestive heart failure.  It is not known whether this medicine will harm an unborn baby. Tell your doctor if you are pregnant.  It is not known whether bezlotoxumab passes into breast milk or if it could harm a nursing baby. Tell your doctor if you are breast-feeding a baby.  Bezlotoxumab is not approved for use by anyone younger than 32 years old.  How is bezlotoxumab given?  Bezlotoxumab is injected into a vein through an IV. A healthcare provider will give you this injection.  This medicine must be given slowly, and the IV infusion can take about 60 minutes to complete.  Bezlotoxumab has no antibacterial effects and will not treat the underlying infection. You must use antibiotic medication to treat C. difficile infection.  Use your antibiotic medication for the full prescribed length of time. Your symptoms may improve before the infection is completely cleared. Skipping doses of your antibiotic may also increase your risk of further infection that is resistant to antibiotics.  Read all patient information, medication guides, and instruction sheets provided to you. Ask your doctor or pharmacist if you have any questions.  What happens if I miss a dose?  Since bezlotoxumab is used as a single dose, it does not have a daily dosing schedule.  What happens if I overdose?  Since this medicine is given by a healthcare professional in a medical setting, an overdose is unlikely to occur.  What should I avoid after receiving bezlotoxumab?  Follow your doctor's instructions about any restrictions on food, beverages, or activity.  What are the possible side effects of bezlotoxumab?  Get emergency medical help if you have signs of an allergic reaction: hives; difficult breathing; swelling of your face, lips, tongue, or throat.  Call your doctor at once if you have:  ?? worsening symptoms of C. difficile infection, such as severe stomach pain or watery diarrhea;  ?? swelling in your hands, ankles, or feet;  ?? rapid weight gain; or  ?? shortness of breath (even with mild exertion).  Common side effects may include:  ?? nausea;  ?? fever; or  ?? headache.  This is not a complete list of side effects and others may occur. Call your doctor for medical advice about side effects. You may report side effects to FDA at 1-800-FDA-1088.  What other drugs will affect  bezlotoxumab?  Other drugs may interact with bezlotoxumab, including prescription and over-the-counter medicines, vitamins, and herbal products. Tell each of your health care providers about all medicines you use now and any medicine you start or stop using.  Where can I get more information?  Your doctor or pharmacist can provide more information about bezlotoxumab.  Remember, keep this and all other medicines out of the reach of children, never share your medicines with others, and use this medication only for the indication prescribed.   Every effort has been made to ensure that the information provided by [employer] ('[employer]') is accurate, up-to-date, and complete, but no guarantee is made to that effect. Drug information contained herein may be time sensitive. [employer] information has been compiled for use by healthcare practitioners and consumers in the [place] and therefore [employer] does not warrant that uses outside of the [place] are appropriate, unless specifically indicated otherwise. [employer]'s drug information does not endorse drugs, diagnose patients or recommend therapy. [employer]'s drug information is an [employer], corporate to assist licensed healthcare practitioners in caring for their patients and/or to serve consumers viewing this service as a supplement to, and not a substitute for, the expertise, skill, knowledge and judgment of healthcare practitioners. The absence of a warning for a given drug or drug combination in no way should be construed to indicate that the drug or drug combination is safe, effective or appropriate for any given patient. [employer] does not assume any responsibility for any aspect of healthcare administered with the aid of information [employer] provides. The information contained herein is not intended to cover all possible uses, directions, precautions, warnings, drug interactions, allergic reactions, or adverse effects. If you have questions about the drugs you are taking, check with your doctor, nurse or pharmacist.  Copyright [id number] [employer] Version: 1.02. Revision date: 03/14/2015.  Care instructions adapted under license by [facility]. If you have questions about a medical condition or this instruction, always ask your healthcare professional. [employer] disclaims any warranty or liability for your use of this information.

## 2019-09-17 NOTE — Unmapped (Signed)
1410 Patient arrived Transplant Infusion Room today for bezlotoxumab, Condition: well; Mobility: ambulating; accompanied by self.   See Flowsheet and MAR for all details of visit.   1410 VS stable.  1420 PIV placed and secured with coban, NS KVO; labs no orders found, urine no orders found.  1444 Infusion initiated.  1544 Infusion complete.  1550 VS stable, PIV removed, pt left clinic, Condition: well; Mobility: ambulating; accompanied by self.

## 2019-09-18 LAB — CBC W/ DIFFERENTIAL
BANDED NEUTROPHILS ABSOLUTE COUNT: 0 10*3/uL (ref 0.0–0.1)
BASOPHILS ABSOLUTE COUNT: 0 10*3/uL (ref 0.0–0.2)
BASOPHILS RELATIVE PERCENT: 1 %
EOSINOPHILS ABSOLUTE COUNT: 0.1 10*3/uL (ref 0.0–0.4)
EOSINOPHILS RELATIVE PERCENT: 2 %
HEMATOCRIT: 37.1 % (ref 34.0–46.6)
HEMOGLOBIN: 12.1 g/dL (ref 11.1–15.9)
LYMPHOCYTES ABSOLUTE COUNT: 0.4 10*3/uL — ABNORMAL LOW (ref 0.7–3.1)
LYMPHOCYTES RELATIVE PERCENT: 8 %
MEAN CORPUSCULAR HEMOGLOBIN CONC: 32.6 g/dL (ref 31.5–35.7)
MEAN CORPUSCULAR HEMOGLOBIN: 30.6 pg (ref 26.6–33.0)
MEAN CORPUSCULAR VOLUME: 94 fL (ref 79–97)
MONOCYTES ABSOLUTE COUNT: 0.5 10*3/uL (ref 0.1–0.9)
MONOCYTES RELATIVE PERCENT: 9 %
NEUTROPHILS ABSOLUTE COUNT: 4.2 10*3/uL (ref 1.4–7.0)
NEUTROPHILS RELATIVE PERCENT: 80 %
RED BLOOD CELL COUNT: 3.95 x10E6/uL (ref 3.77–5.28)
RED CELL DISTRIBUTION WIDTH: 13.8 % (ref 11.7–15.4)
WHITE BLOOD CELL COUNT: 5.3 10*3/uL (ref 3.4–10.8)

## 2019-09-18 LAB — RENAL FUNCTION PANEL
ALBUMIN: 4.3 g/dL (ref 3.8–4.8)
BLOOD UREA NITROGEN: 29 mg/dL — ABNORMAL HIGH (ref 6–20)
BUN / CREAT RATIO: 17 (ref 9–23)
CO2: 18 mmol/L — ABNORMAL LOW (ref 20–29)
CREATININE: 1.7 mg/dL — ABNORMAL HIGH (ref 0.57–1.00)
GFR MDRD AF AMER: 46 mL/min/{1.73_m2} — ABNORMAL LOW
GFR MDRD NON AF AMER: 40 mL/min/{1.73_m2} — ABNORMAL LOW
GLUCOSE: 94 mg/dL (ref 65–99)
PHOSPHORUS, SERUM: 3.8 mg/dL (ref 3.0–4.3)
POTASSIUM: 5.4 mmol/L — ABNORMAL HIGH (ref 3.5–5.2)
SODIUM: 143 mmol/L (ref 134–144)

## 2019-09-18 LAB — MAGNESIUM: Magnesium:MCnc:Pt:Ser/Plas:Qn:: 1.8

## 2019-09-18 LAB — MEAN CORPUSCULAR HEMOGLOBIN: Erythrocyte mean corpuscular hemoglobin:EntMass:Pt:RBC:Qn:Automated count: 30.6

## 2019-09-18 LAB — AMYLASE: Amylase:CCnc:Pt:Ser/Plas:Qn:: 151 — ABNORMAL HIGH

## 2019-09-18 LAB — GLUCOSE: Glucose:MCnc:Pt:Ser/Plas:Qn:: 94

## 2019-09-18 LAB — LIPASE: Triacylglycerol lipase:CCnc:Pt:Ser/Plas:Qn:: 22

## 2019-09-18 NOTE — Unmapped (Signed)
Addended by: Darlen Round on: 09/18/2019 02:53 PM     Modules accepted: Orders

## 2019-09-19 LAB — TACROLIMUS BLOOD: Tacrolimus:MCnc:Pt:Bld:Qn:LC/MS/MS: 10.1

## 2019-09-20 DIAGNOSIS — Z940 Kidney transplant status: Principal | ICD-10-CM

## 2019-09-20 DIAGNOSIS — Z9483 Pancreas transplant status: Principal | ICD-10-CM

## 2019-09-20 DIAGNOSIS — D849 Immunodeficiency, unspecified: Principal | ICD-10-CM

## 2019-09-20 MED ORDER — TACROLIMUS XR 4 MG TABLET,EXTENDED RELEASE 24 HR
ORAL_TABLET | Freq: Every day | ORAL | 11 refills | 30 days | Status: CP
Start: 2019-09-20 — End: ?

## 2019-09-20 MED ORDER — TACROLIMUS XR 1 MG TABLET,EXTENDED RELEASE 24 HR
ORAL_TABLET | Freq: Every day | ORAL | 11 refills | 30.00000 days | Status: CP
Start: 2019-09-20 — End: ?

## 2019-09-20 NOTE — Unmapped (Signed)
Called pt to instruct her to decrease Envarsus to 22 mg. She verbalized understanding.    She states that diarrhea is still present but improving; she has 3 loose BM per day.  Pt confirmed that she will come to 8/13 follow up apt with Dr. Carlene Coria.

## 2019-09-21 DIAGNOSIS — Z940 Kidney transplant status: Principal | ICD-10-CM

## 2019-09-21 DIAGNOSIS — Z9483 Pancreas transplant status: Principal | ICD-10-CM

## 2019-09-23 DIAGNOSIS — Z9483 Pancreas transplant status: Principal | ICD-10-CM

## 2019-09-23 DIAGNOSIS — Z940 Kidney transplant status: Principal | ICD-10-CM

## 2019-09-24 ENCOUNTER — Ambulatory Visit: Admit: 2019-09-24 | Discharge: 2019-09-25 | Payer: MEDICARE | Attending: Ambulatory Care | Primary: Ambulatory Care

## 2019-09-24 ENCOUNTER — Ambulatory Visit: Admit: 2019-09-24 | Discharge: 2019-09-25 | Payer: MEDICARE | Attending: Nephrology | Primary: Nephrology

## 2019-09-24 DIAGNOSIS — Z940 Kidney transplant status: Principal | ICD-10-CM

## 2019-09-24 DIAGNOSIS — D849 Immunodeficiency, unspecified: Principal | ICD-10-CM

## 2019-09-24 DIAGNOSIS — Z79899 Other long term (current) drug therapy: Principal | ICD-10-CM

## 2019-09-24 LAB — URINALYSIS
BLOOD UA: NEGATIVE
GLUCOSE UA: NEGATIVE
HYALINE CASTS: 1 /LPF (ref 0–1)
KETONES UA: NEGATIVE
LEUKOCYTE ESTERASE UA: NEGATIVE
NITRITE UA: NEGATIVE
PH UA: 6 (ref 5.0–9.0)
RBC UA: <1 /HPF (ref <4)
SPECIFIC GRAVITY UA: 1.02 (ref 1.005–1.030)
SQUAMOUS EPITHELIAL: 12 /HPF — ABNORMAL HIGH (ref 0–5)
UROBILINOGEN UA: 0.2
WBC UA: 1 /HPF (ref 0–5)

## 2019-09-24 LAB — PROTEIN / CREATININE RATIO, URINE: CREATININE, URINE: 74.6 mg/dL

## 2019-09-24 LAB — PROTEIN/CREAT RATIO, URINE: Protein/Creatinine:MRto:Pt:Urine:Qn:: 0.121

## 2019-09-24 LAB — PROTEIN UA: Protein:MCnc:Pt:Urine:Qn:Test strip: NEGATIVE

## 2019-09-24 NOTE — Unmapped (Signed)
[facility] PHARMACY NOTE  09/24/2019   Elaine Koch  [id number]    Medication changes today:   1. None    Education/Adherence tools provided today:  1.  provided additional education on immunosuppression and transplant related medications including reviewing indications of medications, dosing and side effects    Follow up items:  1. goal of understanding indications and dosing of immunosuppression medications   2. Consider stopping prednisone once Myfortic dose back to goal and pancreatic enzymes are at baseline  3. IUD - cannot get placed until annual visit with PCP  4.Diarrhea sx post-bezlotoxumab, was referred to Dr. Fara Boros for FMT    Next visit with pharmacy in 1-3 months  ____________________________________________________________________    Elaine Koch is a 34 y.o. 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     Other PMH significant for hypertension, sickle cell trait positive, retinopathy, preeclampsia     Interval history: Admitted 6/1-6/4 w/HA, elevated BP, vomiting found to be C diff + and was started on PO vancomycin.  She developed neutropenia after admission and was treated with 1 dose of G-CSF on 07/22/19.  She completed her 10d course of vancomycin on 6/12 and watery diarrhea resumed so she restarted PO vancomycin on 6/18.    Seen by pharmacy today for: medication management and pill box fill and adherence education; last seen by pharmacy 2 months ago     CC:  Patient complains of  mild tremor, daily headaches, continued diarrhea     Vitals:    09/24/19 1209   BP: 136/80   Pulse: 85       Allergies   Allergen Reactions   ??? Iodinated Contrast Media Swelling, Rash and Other (See Comments)     Burning, warmth throughout body and tingling.  Throat swelling.  Treated with benadryl and symptoms improved.  Has not had contrast since then (2013 or 2014).   ??? Nickel Rash   ??? Propranolol Swelling   ??? Eye Irrigating Solution [Ophthalmic Irrigation Solution] Other (See Comments)     Contrast dye (name?) used in eyes caused hot feeling in face, reversed with Benadryl.    ??? Iodine      Other reaction(s): Skin Rash   ??? Naltrexone Rash   ??? Uni-Cortrom Rash       Medications reviewed in EPIC medication station and updated today by the clinical pharmacist practitioner.    Outpatient Encounter Medications as of 09/24/2019   Medication Sig Dispense Refill   ??? acetaminophen (TYLENOL) 500 MG tablet Take 1-2 tablets (500-1,000 mg total) by mouth every six (6) hours as needed for pain or fever greater than 100.48F (38C). 100 tablet 0   ??? aspirin (ECOTRIN) 81 MG tablet Take 1 tablet (81 mg total) by mouth daily. 30 tablet 11   ??? biotin 5 mg tablet Take 1 tablet (5 mg total) by mouth daily. 30 tablet 11   ??? cholecalciferol, vitamin D3-125 mcg, 5,000 unit,, 125 mcg (5,000 unit) capsule Take 1 capsule (125 mcg total) by mouth daily. 100 capsule 10   ??? fidaxomicin (DIFICID) 200 mg tablet Take 1 tablet (200 mg total) by mouth every morning before breakfast. C-diff 30 tablet 0   ??? multivitamin, prenatal, folic acid-iron, 27-1 mg Tab Take 1 tablet by mouth daily.     ??? mycophenolate (MYFORTIC) 180 MG EC tablet Take 2 tablets (360 mg total) by mouth Two (2) times a day. 120 tablet 11   ??? predniSONE (  DELTASONE) 5 MG tablet Take 1 tablet (5 mg total) by mouth daily. 30 tablet 11   ??? sodium bicarbonate 650 mg tablet Take 2 tablets (1,300 mg total) by mouth Three (3) times a day. 180 tablet 3   ??? sulfamethoxazole-trimethoprim (BACTRIM) 400-80 mg per tablet Take 1 tablet (80 mg of trimethoprim total) by mouth Every Monday, Wednesday, and Friday. 12 tablet 2   ??? tacrolimus (ENVARSUS XR) 1 mg Tb24 extended release tablet Take 2 tablets (2 mg total) by mouth once daily with 5 (4 mg) tablets for total dose of 22 mg daily 60 tablet 11   ??? tacrolimus (ENVARSUS XR) 4 mg Tb24 extended release tablet Take 5 tablets (20 mg total) by mouth once daily with 2 (1 mg) tablets for total dose of 22 mg daily 150 tablet 11   ??? vancomycin (VANCOCIN) 125 MG capsule 4x/d for 14d, 2x/d for 7d, 1x/d for 7d, every 2d x 7d, every 3d x 14d 85 capsule 1     No facility-administered encounter medications on file as of 09/24/2019.     Induction agent : alemtuzumab    CURRENT IMMUNOSUPPRESSION: Envarsus 22 mg daily  prograf/Envarsus/cyclosporine goal: 8-10   myfortic360 mg PO bid per protocol   prednisone 5mg  daily (started 05/27/19 given elevated pancreatic enzymes in setting of low tac levels)    Patient complains of mild tremor and headaches (5-6/10 pain)    IMMUNOSUPPRESSION DRUG LEVELS:  Lab Results   Component Value Date    Tacrolimus, Trough 9.4 08/02/2019    Tacrolimus, Trough 9.4 08/02/2019    Tacrolimus, Trough 7.8 07/29/2019    Tacrolimus Lvl 10.1 09/17/2019    Tacrolimus Lvl 6.1 09/10/2019    Tacrolimus Lvl 6.3 09/03/2019     No results found for: CYCLO  No results found for: EVEROLIMUS  No results found for: SIROLIMUS    Labs drawn at [facility] today, patient unsure of what time, yesterday's dose given at 0930    Graft function: stable - slight bump in most recent labs, likely 2/2 to high Envarsus level  DSA: ntd  Zero hour biopsy: no abnormalities  Biopsies to date: ntd  UPC: 0.125 on 07/14/19  Amylase: 151 (09/17/19)  Lipase: 22 (09/17/19)  WBC/ANC:  wnl    Plan: Will maintain current immunosuppression  Per Dr. Matilde Haymaker, once pancreatic enzymes normalize and tac level at goal can consider stopping prednisone.  Continue to monitor.    OI Prophylaxis:   CMV Status: D+/ R+, moderate risk . CMV prophylaxis: valganciclovir 450 mg daily x 3 months per protocol complete  Lab Results   Component Value Date    CMV Quant <50 (H) 05/27/2019   Estimated Creatinine Clearance: 49.5 mL/min (A) (based on SCr of 1.7 mg/dL (H)).  PCP Prophylaxis: bactrim SS 1 tab MWF x 6 months   Thrush: completed in hospital  Patient is  tolerating infectious prophylaxis well    Plan: Continue to monitor.    +C diff (6/2): complicated reccurent c. Diff history. Initially treated 6/2-12 then diarrhea recurred after stopping. Saw ICID, recommended changing to fidaxomicin once daily and one time infusion of bezltoxumab. History and ICID recommendations as below:  - Vancomycin PO 125 mg QID (6/18-7/2) - recurrent diarrhea  - 08/27/19-7/26 fidaxomicin  - 8/2 relapse on vancomycin taper   - 8/4 change to fidaxomicin once daily  - 8/6 bezlotoxumab  Per patient, no change in symptoms since starting fidaxomicin and getting bezlotoxumab. Has alternating good and bad days, bad days consist  of continuous diarrhea. Reports diarrhea every 30-60 minutes last night from 2100-0600  Meds currently on: fidaxomicin 200 mg daily - confirmed that medication was picked up and that she has a month supply  Plan: Continue current fidaxomicin. Was referred by ICID to Dr. Fara Boros for discussion of FMT, will continue to monitor     CV Prophylaxis: asa 81 mg daily  The ASCVD Risk score Denman George DC Montez Hageman, et al., 2013) failed to calculate.  Statin therapy: Not indicated; currently on no statin  Plan: Continue to monitor     BP: Goal < 140/90. Clinic vitals reported above.  Home BP ranges: 130/80s   Current meds include: None  Plan: Continue to monitor.     Anemia of CKD:  H/H:   Lab Results   Component Value Date    HGB 12.1 09/17/2019     Lab Results   Component Value Date    HCT 37.1 09/17/2019     Iron panel:  No results found for: IRON, TIBC, FERRITIN  No results found for: LABIRON    Prior ESA use: none post transplant    Plan: stable. Continue to monitor.     DM:   Lab Results   Component Value Date    A1C 4.8 08/06/2019   . Goal A1c < 7  History of Dm? Yes: T1DM  Currently on: no insulin post pancreas transplant  Home BS log: did not bring  Diet: good appetite  Exercise: not yet  Hypoglycemia: no  Plan:  No longer needs to check BG.  Continue to monitor    Fluid Intake: ~70-80 oz  Plan: Continue to monitor     Electrolytes: CO2 : 18 runs on the lower end, potassium on higher end (5.4)   Meds currently on: sodium bicarbonate 1300 mg TID   Plan: Will continue current supplementation for now, could consider increasing sodium bicarbonate at future visits if it continues to run low/diarrhea continues. Continue to monitor     GI/BM: pt reports continued diarrhea as noted above. Has some infrequent nausea when taking medications (likely 2/2 pill burden), denies any vomiting, heartburn  Meds currently on:  MVI daily  Plan: Recommended taking medications with water or juice to help with nausea. Continue to monitor    Pain: pt reports no  pain  Meds currently on: APAP PRN (uses occasionally for headache)  Plan:Continue to monitor    Bone health:   Vitamin D Level: 24.8 on 06/01/19. Goal > 30.   Last DEXA results:  none available  Current meds include: cholecalciferol 5,000 units daily  Plan: Vitamin D level  out of goal; continue supplementation. Continue to monitor.     Women's/Men's Health:  Elaine Koch is a 34 y.o. Female of childbearing age. Patient reports no men's/women's health issues.  No longer has an IUD. Denied by insurance for placement- will need to go to PCP for placement during annual visit  Plan: Follow-up IUD placement. Continue to monitor.    Hair thinning:  Meds currently on: biotin 5 mg daily - reports improvement   Plan: continue to monitor    Immunizations: first vaccine received pior to transplant,   Immunization History   Administered Date(s) Administered   ??? COVID-19 VACC,MRNA,(PFIZER)(PF)(IM) 04/29/2019, 08/06/2019   ??? HPV Quadrivalent (Gardasil) 05/28/2012   ??? Hepatitis B, Adult 12/10/2017   ??? Influenza Vaccine Quad (IIV4 PF) 40mo+ injectable 10/17/2015, 01/29/2019   ??? Influenza Virus Vaccine, unspecified formulation 11/12/2016, 01/31/2019   ??? PNEUMOCOCCAL POLYSACCHARIDE 23  12/20/2016   ??? TdaP 10/17/2015       Pharmacy preference:  [facility]    Adherence: Patient has good understanding of medications; was able to identify names/doses of immunosuppressants and OI meds with help from her mother.  Patient  does fill their own pill box on a regular basis at home.  Patient brought medication card:no  Pill box:did not bring  Plan: provided moderate adherence counseling/intervention    Patient was reviewed with Dr. Carlene Coria who was agreement with the stated plan:     During this visit, the following was completed:   BG log data assessment  BP log data assessment  Labs ordered and evaluated  complex treatment plan >1 DS   I spent a total of 20 minutes face to face with the patient delivering clinical care and providing education/counseling.    All questions/concerns were addressed to the patient's satisfaction.  __________________________________________  Elmer Ramp, Pharm D  PGY-1 Resident - Acute Care    Ellee Wawrzyniak MINCEMOYER, PHARMD, CPP  [employer] TR[employer] CLINICAL PHARMACIST PRACTITIONER  PAGER [phone]

## 2019-09-24 NOTE — Unmapped (Signed)
Transplant Nephrology Clinic Visit      History of Present Illness    Elaine Koch is a 34 y.o. 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 She does not have evidence of donor specific antibodies as of 07/15/2019. Her most recent baseline creatinine is between 1.3-1.5, occasionally down to 1.1 or up to 1.6.    She was on hemodialysis for about 3 years prior to transplant through an AVF. Her diabetes has been complicated by retinopathy. She had pre-eclampsia during pregnancy in 2017. She also had severe anemia requiring multiple blood transfusions during pregnancy and for some time after. In spite of that she had a cPRA of 0% prior to transplant.    She was admitted from 3/21 through 05/11/19 for the initial transplant. Her course was fairly uncomplicated. On POD 8 did have a transient bump in her creatinine (1.39 to 1.57) and amylase (76 to 112), came down to 1.52 and 89 with fluids. [place] of both kidney and pancreas showed patent vessels and normal RIs. After discharge, she had some sugars in the 120s. On 4/2 reported BP 88/58 so amlodipine was held. At surgery visit 4/8 reported loose stools, which she had pre-transplant. Reglan was decreased. BP 80s/40s so all antihypertensives were held. At visit 4/15 pressures remained low so midodrine 5 mg daily was started, reglan further decreased. Her amylase was 467 at that visit so prednisone 5 mg was started since tacrolimus was sub-therapeutic, with plans to taper off once tac stable and pancreatic enzymes normal. At nephrology visit 4/20, amylase down to 179 and tacrolimus level better on TID dosing. Ureteral stent removed 06/10/19. At visit 5/25 planned transition to Envarsus given high tacrolimus requirement. Amylase remained elevated but stable at 149. Blood pressure improved and was no longer hypotensive.     She called the on-call coordinator on 07/11/19 complaining of headache, nausea and vomiting, diarrhea the night prior. No fever. Was able to keep morning meds down. Recommended bland diet, push fluids. On 6/1 called to report BP 143/110 with chest pain, headache and vomiting and was referred to ED. She was admitted from 6/1 through 07/16/19. On admission she was noted to have a creatinine of 3.22 (from 1.19 on 5/28) in the setting of a tacrolimus level of 27.9. Tacrolimus was held and level came down. She was hypotensive and given IVF. She was found to be C.diff positive and started on vancomycin with plans for 10 day course. Creatinine to 1.32 on discharge. Wbc 0.8 on discharge with ANC 0.5. CMV negative on 6/1. Was discharged on Myfortic 720 mg BID.    On 07/20/19 she was noted to have a wbc < 0.6 and ANC < 0.2. She reported no symptoms and was instructed to hold Myfortic and Valcyte on 6/10 and received a dose of Granix. On 6/10 wbc to 0.6 with ANC 0.3. On 6/16 she reported diarrhea returned after stopping vancomycin, course was extended for an additional 14 days. Wbc improved to 3.9 on 6/14 and then 6.1 on 6/17 so Myfortic restarted at 180 mg BID. Creatinine to 1.56 on 6/17 and 1.49 on 6/21, reported had not been drinking as much since back at work. Wbc 8.6 on 6/21 with ANC 7.5. Most recent lipase has been 90-122.    Interval since last visit 08/06/19    Since last visit patient's diarrhea became worse after starting vancomycin.  She followed up with ID on 08/31/19, who placed a referral to Dr. Fara Boros with  GI for possible FMT. Patient finished fidaxomicin on 09/06/19 and restarted Vancomycin.  When this occurred, patient's diarrhea returned.  Patient's vancomycin stopped and she restarted Fidaxomicin on 09/13/19.  She completed her Bezlotoxumab on 09/17/19. Since then patient's diarrhea continues to occur every other day per patient.     She presents today for follow up.  She states her headaches have become more frequent in the past two weeks, but have not changed in intensity or duration. She takes tylenol as needed. She has fine motor hand tremors at times with left hand worse than right. She complains of nausea in the am occasionally.      She is back to work now, has 2 sons aged 3 and another about to turn 49.    She specifically denies fever, myalgias, upper respiratory/GI symptoms, or change in taste/smell.    Last dose of Prograf: 9:30 am yesterday    Review of Systems    Otherwise on review of systems patient denies fever or chills, chest pain, SOB, PND or orthopnea, lower extremity edema. Denies N/V/abdominal pain. No dysuria, hematuria or difficulty voiding. Bowel movements normal. Denies joint pain or rash. All other systems are reviewed and are negative.    Medications    Current Outpatient Medications   Medication Sig Dispense Refill   ??? acetaminophen (TYLENOL) 500 MG tablet Take 1-2 tablets (500-1,000 mg total) by mouth every six (6) hours as needed for pain or fever greater than 100.55F (38C). 100 tablet 0   ??? aspirin (ECOTRIN) 81 MG tablet Take 1 tablet (81 mg total) by mouth daily. 30 tablet 11   ??? biotin 5 mg tablet Take 1 tablet (5 mg total) by mouth daily. 30 tablet 11   ??? cholecalciferol, vitamin D3-125 mcg, 5,000 unit,, 125 mcg (5,000 unit) capsule Take 1 capsule (125 mcg total) by mouth daily. 100 capsule 10   ??? fidaxomicin (DIFICID) 200 mg tablet Take 1 tablet (200 mg total) by mouth every morning before breakfast. C-diff 30 tablet 0   ??? multivitamin, prenatal, folic acid-iron, 27-1 mg Tab Take 1 tablet by mouth daily.     ??? mycophenolate (MYFORTIC) 180 MG EC tablet Take 2 tablets (360 mg total) by mouth Two (2) times a day. 120 tablet 11   ??? predniSONE (DELTASONE) 5 MG tablet Take 1 tablet (5 mg total) by mouth daily. 30 tablet 11   ??? sodium bicarbonate 650 mg tablet Take 2 tablets (1,300 mg total) by mouth Three (3) times a day. 180 tablet 3   ??? sulfamethoxazole-trimethoprim (BACTRIM) 400-80 mg per tablet Take 1 tablet (80 mg of trimethoprim total) by mouth Every Monday, Wednesday, and Friday. 12 tablet 2   ??? tacrolimus (ENVARSUS XR) 1 mg Tb24 extended release tablet Take 2 tablets (2 mg total) by mouth once daily with 5 (4 mg) tablets for total dose of 22 mg daily 60 tablet 11   ??? tacrolimus (ENVARSUS XR) 4 mg Tb24 extended release tablet Take 5 tablets (20 mg total) by mouth once daily with 2 (1 mg) tablets for total dose of 22 mg daily 150 tablet 11   ??? vancomycin (VANCOCIN) 125 MG capsule 4x/d for 14d, 2x/d for 7d, 1x/d for 7d, every 2d x 7d, every 3d x 14d 85 capsule 1     No current facility-administered medications for this visit.       Physical Exam    BP 136/80 (BP Site: L Arm, BP Position: Sitting, BP Cuff Size: Large)  - Pulse 85  -  Temp 36.1 ??C (96.9 ??F) (Temporal)  - Wt 80.1 kg (176 lb 9.6 oz)  - LMP 04/30/2019 (Approximate)  - BMI 29.39 kg/m??   General: Patient is a pleasant female in no apparent distress.  Eyes: Sclera anicteric.  ENT: Mask in place.  Neck: Supple without LAD/JVD/bruits.  Lungs: Clear to auscultation bilaterally, no wheezes/rales/rhonchi.  Cardiovascular: Regular rate and rhythm without murmurs, rubs or gallops.  Abdomen: Soft, notender/nondistended. Positive bowel sounds. No tenderness over the graft.  Extremities: Without edema, joints without evidence of synovitis.  Skin: Without rash.  Neurological: Grossly nonfocal.  Psychiatric: Mood and affect appropriate.    Laboratory Results    Results for orders placed or performed in visit on 09/24/19   Urinalysis   Result Value Ref Range    Color, UA Yellow     Clarity, UA Clear     Specific Gravity, UA 1.020 1.005 - 1.030    pH, UA 6.0 5.0 - 9.0    Leukocyte Esterase, UA Negative Negative    Nitrite, UA Negative Negative    Protein, UA Negative Negative    Glucose, UA Negative Negative    Ketones, UA Negative Negative    Urobilinogen, UA 0.2 mg/dL 0.2 - 2.0 mg/dL    Bilirubin, UA Negative Negative    Blood, UA Negative Negative    RBC, UA <1 <4 /HPF    WBC, UA 1 0 - 5 /HPF    Squam Epithel, UA 12 (H) 0 - 5 /HPF Bacteria, UA Occasional (A) None Seen /HPF    Hyaline Casts, UA 1 0 - 1 /LPF       Assessment and Plan      1. Status post renal transplant. Her creatinine is 1.48 and within her most recent baseline. Envarsus trough 7.3 today, and is just shy of her goal of 8-10. Will continue current immunosuppression.    2. Status post pancreas transplant. She has had a mildly elevated lipase since transplant. Peaked at 467 on 05/27/19. On 08/02/19 amylase was mildly elevated at 113, lipase normal at 23. Today Amylase elevated at 152 and lipase normal at 21. Hgb A1c reassuring at 4.8%.    3. Leukopenia. Wbc remains normal at 6.1    4. History of hypertension. Blood pressure reasonable off meds, may consider adding ACEi/ARB at some point.    5. C.diff. Diarrhea occurring every other day.  Currently on fidaxomicin daily.  Patient referred to Dr. Fara Boros with GI for possible FMT.     6. Health maintenance. She received a flu shot this fall. She has had a Pnemovax vaccine, needs Prevnar 13. Received both Covid vaccines.     We discussed that while we continue to recommend the COVID vaccine for all eligible patients, we know that kidney transplant patients do not respond as strongly due to their immunosuppression, so she should continue to follow the CDC guidelines for patients who are not vaccinated. I would also encourage all household members and close contacts that are eligible for vaccination to get vaccinated. We discussed the importance of ongoing social distancing, wearing a mask outside the home, and hand washing/sanitizing.     7. Will see patient back in 6 weeks, or sooner if needed.

## 2019-09-25 LAB — RENAL FUNCTION PANEL
ALBUMIN: 4 g/dL (ref 3.8–4.8)
BLOOD UREA NITROGEN: 19 mg/dL (ref 6–20)
CALCIUM: 9.5 mg/dL (ref 8.7–10.2)
CHLORIDE: 112 mmol/L — ABNORMAL HIGH (ref 96–106)
CO2: 21 mmol/L (ref 20–29)
CREATININE: 1.48 mg/dL — ABNORMAL HIGH (ref 0.57–1.00)
GFR MDRD AF AMER: 54 mL/min/{1.73_m2} — ABNORMAL LOW
GFR MDRD NON AF AMER: 47 mL/min/{1.73_m2} — ABNORMAL LOW
PHOSPHORUS, SERUM: 3.1 mg/dL (ref 3.0–4.3)
POTASSIUM: 5 mmol/L (ref 3.5–5.2)
SODIUM: 144 mmol/L (ref 134–144)

## 2019-09-25 LAB — CBC W/ DIFFERENTIAL
BANDED NEUTROPHILS ABSOLUTE COUNT: 0 10*3/uL (ref 0.0–0.1)
BASOPHILS ABSOLUTE COUNT: 0 10*3/uL (ref 0.0–0.2)
BASOPHILS RELATIVE PERCENT: 1 %
EOSINOPHILS RELATIVE PERCENT: 2 %
HEMOGLOBIN: 12.3 g/dL (ref 11.1–15.9)
IMMATURE GRANULOCYTES: 0 %
LYMPHOCYTES ABSOLUTE COUNT: 0.4 10*3/uL — ABNORMAL LOW (ref 0.7–3.1)
LYMPHOCYTES RELATIVE PERCENT: 6 %
MEAN CORPUSCULAR HEMOGLOBIN CONC: 32.4 g/dL (ref 31.5–35.7)
MEAN CORPUSCULAR HEMOGLOBIN: 29.8 pg (ref 26.6–33.0)
MONOCYTES ABSOLUTE COUNT: 0.5 10*3/uL (ref 0.1–0.9)
MONOCYTES RELATIVE PERCENT: 9 %
NEUTROPHILS ABSOLUTE COUNT: 5 10*3/uL (ref 1.4–7.0)
NEUTROPHILS RELATIVE PERCENT: 82 %
PLATELET COUNT: 290 10*3/uL (ref 150–450)
RED BLOOD CELL COUNT: 4.13 x10E6/uL (ref 3.77–5.28)
RED CELL DISTRIBUTION WIDTH: 13.8 % (ref 11.7–15.4)
WHITE BLOOD CELL COUNT: 6.1 10*3/uL (ref 3.4–10.8)

## 2019-09-25 LAB — LIPASE: Triacylglycerol lipase:CCnc:Pt:Ser/Plas:Qn:: 21

## 2019-09-25 LAB — AMYLASE: Amylase:CCnc:Pt:Ser/Plas:Qn:: 152 — ABNORMAL HIGH

## 2019-09-25 LAB — MAGNESIUM: Magnesium:MCnc:Pt:Ser/Plas:Qn:: 1.4 — ABNORMAL LOW

## 2019-09-25 LAB — CALCIUM: Calcium:MCnc:Pt:Ser/Plas:Qn:: 9.5

## 2019-09-25 LAB — NEUTROPHILS RELATIVE PERCENT: Neutrophils/100 leukocytes:NFr:Pt:Bld:Qn:Automated count: 82

## 2019-09-27 DIAGNOSIS — Z9483 Pancreas transplant status: Principal | ICD-10-CM

## 2019-09-27 DIAGNOSIS — Z940 Kidney transplant status: Principal | ICD-10-CM

## 2019-09-27 DIAGNOSIS — D849 Immunodeficiency, unspecified: Principal | ICD-10-CM

## 2019-09-27 DIAGNOSIS — E0929 Drug or chemical induced diabetes mellitus with other diabetic kidney complication: Principal | ICD-10-CM

## 2019-09-27 LAB — TACROLIMUS BLOOD: Tacrolimus:MCnc:Pt:Bld:Qn:LC/MS/MS: 7.3

## 2019-09-28 DIAGNOSIS — Z940 Kidney transplant status: Principal | ICD-10-CM

## 2019-09-28 DIAGNOSIS — Z9483 Pancreas transplant status: Principal | ICD-10-CM

## 2019-09-30 DIAGNOSIS — Z940 Kidney transplant status: Principal | ICD-10-CM

## 2019-09-30 DIAGNOSIS — Z9483 Pancreas transplant status: Principal | ICD-10-CM

## 2019-10-01 ENCOUNTER — Other Ambulatory Visit: Payer: Medicare Other

## 2019-10-01 NOTE — Unmapped (Signed)
Call from pt. [phone], states she's had headache and nausea today, checked temp just now and is 99.3. No vomiting, no other s/s reported. Has taken Tylenol x 2 throughout day. Just showered, feels a bit better. I told her to increase fluids, rest, continue Tylenol q 4 hrs over next 12 hours. Call back if not feeling any better later today and/or if s/s worsen/change. She will do so.

## 2019-10-02 ENCOUNTER — Ambulatory Visit: Admit: 2019-10-02 | Discharge: 2019-10-03 | Disposition: A | Discharge status: Home / Self Care | Payer: MEDICARE

## 2019-10-02 LAB — CBC W/ AUTO DIFF
BASOPHILS ABSOLUTE COUNT: 0 10*9/L (ref 0.0–0.1)
BASOPHILS ABSOLUTE COUNT: 0 10*9/L (ref 0.0–0.1)
BASOPHILS RELATIVE PERCENT: 0.1 %
BASOPHILS RELATIVE PERCENT: 0.2 %
EOSINOPHILS ABSOLUTE COUNT: 0.1 10*9/L (ref 0.0–0.4)
EOSINOPHILS ABSOLUTE COUNT: 0.1 10*9/L (ref 0.0–0.4)
EOSINOPHILS RELATIVE PERCENT: 0.3 %
EOSINOPHILS RELATIVE PERCENT: 0.4 %
HEMATOCRIT: 38.5 % (ref 36.0–46.0)
HEMATOCRIT: 34.8 % — ABNORMAL LOW (ref 36.0–46.0)
HEMOGLOBIN: 12.9 g/dL (ref 12.0–16.0)
HEMOGLOBIN: 11.7 g/dL — ABNORMAL LOW (ref 12.0–16.0)
LARGE UNSTAINED CELLS: 2 % (ref 0–4)
LYMPHOCYTES ABSOLUTE COUNT: 0.5 10*9/L — ABNORMAL LOW (ref 1.5–5.0)
LYMPHOCYTES ABSOLUTE COUNT: 0.5 10*9/L — ABNORMAL LOW (ref 1.5–5.0)
LYMPHOCYTES RELATIVE PERCENT: 3.4 %
LYMPHOCYTES RELATIVE PERCENT: 3.9 %
MEAN CORPUSCULAR HEMOGLOBIN CONC: 33.5 g/dL (ref 31.0–37.0)
MEAN CORPUSCULAR HEMOGLOBIN: 31.2 pg (ref 26.0–34.0)
MEAN CORPUSCULAR HEMOGLOBIN: 31.3 pg (ref 26.0–34.0)
MEAN CORPUSCULAR VOLUME: 93.1 fL (ref 80.0–100.0)
MEAN CORPUSCULAR VOLUME: 93.1 fL (ref 80.0–100.0)
MEAN PLATELET VOLUME: 8.7 fL (ref 7.0–10.0)
MEAN PLATELET VOLUME: 8.5 fL (ref 7.0–10.0)
MONOCYTES ABSOLUTE COUNT: 1 10*9/L — ABNORMAL HIGH (ref 0.2–0.8)
MONOCYTES ABSOLUTE COUNT: 0.9 10*9/L — ABNORMAL HIGH (ref 0.2–0.8)
MONOCYTES RELATIVE PERCENT: 7.3 %
MONOCYTES RELATIVE PERCENT: 7.7 %
NEUTROPHILS ABSOLUTE COUNT: 11.7 10*9/L — ABNORMAL HIGH (ref 2.0–7.5)
NEUTROPHILS ABSOLUTE COUNT: 10.2 10*9/L — ABNORMAL HIGH (ref 2.0–7.5)
NEUTROPHILS RELATIVE PERCENT: 85.9 %
PLATELET COUNT: 223 10*9/L (ref 150–440)
PLATELET COUNT: 214 10*9/L (ref 150–440)
RED BLOOD CELL COUNT: 4.13 10*12/L (ref 4.00–5.20)
RED BLOOD CELL COUNT: 3.74 10*12/L — ABNORMAL LOW (ref 4.00–5.20)
RED CELL DISTRIBUTION WIDTH: 13.7 % (ref 12.0–15.0)
RED CELL DISTRIBUTION WIDTH: 13.8 % (ref 12.0–15.0)
WBC ADJUSTED: 11.8 10*9/L — ABNORMAL HIGH (ref 4.5–11.0)

## 2019-10-02 LAB — BASIC METABOLIC PANEL
ANION GAP: 6 mmol/L (ref 5–14)
BLOOD UREA NITROGEN: 15 mg/dL (ref 9–23)
BUN / CREAT RATIO: 10
CHLORIDE: 108 mmol/L — ABNORMAL HIGH (ref 98–107)
CO2: 23 mmol/L (ref 20.0–31.0)
CREATININE: 1.43 mg/dL — ABNORMAL HIGH
EGFR CKD-EPI AA FEMALE: 56 mL/min/{1.73_m2} — ABNORMAL LOW (ref >=60)
EGFR CKD-EPI NON-AA FEMALE: 48 mL/min/{1.73_m2} — ABNORMAL LOW (ref >=60)
GLUCOSE RANDOM: 102 mg/dL — ABNORMAL HIGH (ref 70–99)
POTASSIUM: 4.6 mmol/L — ABNORMAL HIGH (ref 3.4–4.5)
SODIUM: 137 mmol/L (ref 135–145)

## 2019-10-02 LAB — COMPREHENSIVE METABOLIC PANEL
ALBUMIN: 3.5 g/dL (ref 3.4–5.0)
ALT (SGPT): 45 U/L (ref 10–49)
ANION GAP: 7 mmol/L (ref 5–14)
AST (SGOT): 27 U/L (ref <=34)
BILIRUBIN TOTAL: 1.2 mg/dL (ref 0.3–1.2)
BLOOD UREA NITROGEN: 16 mg/dL (ref 9–23)
BUN / CREAT RATIO: 10
CALCIUM: 9.9 mg/dL (ref 8.7–10.4)
CHLORIDE: 107 mmol/L (ref 98–107)
CO2: 25 mmol/L (ref 20.0–31.0)
CREATININE: 1.53 mg/dL — ABNORMAL HIGH
EGFR CKD-EPI AA FEMALE: 52 mL/min/{1.73_m2} — ABNORMAL LOW (ref >=60)
EGFR CKD-EPI NON-AA FEMALE: 45 mL/min/{1.73_m2} — ABNORMAL LOW (ref >=60)
GLUCOSE RANDOM: 102 mg/dL (ref 70–179)
POTASSIUM: 4.5 mmol/L (ref 3.4–4.5)
PROTEIN TOTAL: 7.7 g/dL (ref 5.7–8.2)

## 2019-10-02 LAB — LIPASE
LIPASE: 19 U/L (ref 12–53)
Triacylglycerol lipase:CCnc:Pt:Ser/Plas:Qn:: 19
Triacylglycerol lipase:CCnc:Pt:Ser/Plas:Qn:: 21

## 2019-10-02 LAB — URINALYSIS WITH CULTURE REFLEX
BILIRUBIN UA: NEGATIVE
BLOOD UA: NEGATIVE
GLUCOSE UA: NEGATIVE
HYALINE CASTS: 1 /LPF (ref 0–1)
LEUKOCYTE ESTERASE UA: NEGATIVE
NITRITE UA: NEGATIVE
PH UA: 6 (ref 5.0–9.0)
PROTEIN UA: NEGATIVE
RBC UA: <1 /HPF (ref <=4)
SQUAMOUS EPITHELIAL: 4 /HPF (ref 0–5)
UROBILINOGEN UA: 0.2
WBC UA: 3 /HPF (ref 0–5)

## 2019-10-02 LAB — MEAN CORPUSCULAR HEMOGLOBIN: Erythrocyte mean corpuscular hemoglobin:EntMass:Pt:RBC:Qn:Automated count: 31.2

## 2019-10-02 LAB — FIO2 VENOUS

## 2019-10-02 LAB — TACROLIMUS, TROUGH
Lab: 2.8 — ABNORMAL LOW
Lab: 5.2

## 2019-10-02 LAB — EBV VIRAL LOAD RESULT: Lab: NOT DETECTED

## 2019-10-02 LAB — PREGNANCY TEST URINE: Choriogonadotropin (pregnancy test):PrThr:Pt:Urine:Ord:: NEGATIVE

## 2019-10-02 LAB — BLOOD GAS, VENOUS
BASE EXCESS VENOUS: 0.5 (ref -2.0–2.0)
HCO3 VENOUS: 25 mmol/L (ref 22–27)
O2 SATURATION VENOUS: 60.7 % (ref 40.0–85.0)
PH VENOUS: 7.39 (ref 7.32–7.43)
PO2 VENOUS: 34 mmHg (ref 30–55)

## 2019-10-02 LAB — PROTIME-INR: PROTIME: 14.5 s — ABNORMAL HIGH (ref 10.5–13.5)

## 2019-10-02 LAB — APTT: Coagulation surface induced:Time:Pt:PPP:Qn:Coag: 33.6

## 2019-10-02 LAB — BLOOD UREA NITROGEN: Urea nitrogen:MCnc:Pt:Ser/Plas:Qn:: 15

## 2019-10-02 LAB — BILIRUBIN TOTAL: Bilirubin:MCnc:Pt:Ser/Plas:Qn:: 1.2

## 2019-10-02 LAB — MONOCYTES RELATIVE PERCENT: Monocytes/100 leukocytes:NFr:Pt:Bld:Qn:Automated count: 7.7

## 2019-10-02 LAB — LACTATE BLOOD VENOUS: Lactate:SCnc:Pt:BldV:Qn:: 1

## 2019-10-02 LAB — PROTIME: Coagulation tissue factor induced:Time:Pt:PPP:Qn:Coag: 14.5 — ABNORMAL HIGH

## 2019-10-02 LAB — HYALINE CASTS: Lab: 1

## 2019-10-02 LAB — AMYLASE: Amylase:CCnc:Pt:Ser/Plas:Qn:: 74

## 2019-10-02 MED ADMIN — mycophenolate (MYFORTIC) EC tablet 360 mg: 360 mg | ORAL | @ 13:00:00 | Stop: 2019-10-02

## 2019-10-02 MED ADMIN — tacrolimus (ENVARSUS XR) extended release tablet 22 mg: 22 mg | ORAL | @ 13:00:00 | Stop: 2019-10-02

## 2019-10-02 MED ADMIN — predniSONE (DELTASONE) tablet 5 mg: 5 mg | ORAL | @ 13:00:00

## 2019-10-02 MED ADMIN — ondansetron (ZOFRAN) injection 4 mg: 4 mg | INTRAVENOUS | @ 07:00:00 | Stop: 2019-10-02

## 2019-10-02 MED ADMIN — acetaminophen (TYLENOL) tablet 650 mg: 650 mg | ORAL | @ 13:00:00

## 2019-10-02 MED ADMIN — ondansetron (ZOFRAN) injection 4 mg: 4 mg | INTRAVENOUS | @ 04:00:00 | Stop: 2019-10-02

## 2019-10-02 MED ADMIN — MORPhine 4 mg/mL injection 4 mg: 4 mg | INTRAVENOUS | @ 04:00:00 | Stop: 2019-10-02

## 2019-10-02 MED ADMIN — prochlorperazine (COMPAZINE) injection 10 mg: 10 mg | INTRAVENOUS | @ 09:00:00 | Stop: 2019-10-02

## 2019-10-02 MED ADMIN — sodium chloride 0.9% (NS) bolus 1,000 mL: 1000 mL | INTRAVENOUS | @ 04:00:00 | Stop: 2019-10-02

## 2019-10-02 MED ADMIN — sodium chloride 0.9% (NS) bolus 1,000 mL: 1000 mL | INTRAVENOUS | @ 09:00:00 | Stop: 2019-10-02

## 2019-10-02 NOTE — Unmapped (Signed)
IS Plan:  - Increase Envarsus to 24 mg given the low tac level  - Reduce cellcept to 180 mg BID from 360 mg BID given the recurrent C diff    Mickeal Needy, MD  [id number]  Division of Nephrology and Hypertension

## 2019-10-02 NOTE — Unmapped (Signed)
Tacrolimus Therapeutic Monitoring Pharmacy Note    Jeimy Bickert is a 34 y.o. female continuing tacrolimus.     Indication: Kidney/pancreas transplant     Date of Transplant: 04/2019      Prior Dosing Information: Current regimen 22 mg Envarsus daily      Goals:  Therapeutic Drug Levels  Tacrolimus trough goal: 8-10 ng/mL    Additional Clinical Monitoring/Outcomes  ?? Monitor renal function (SCr and urine output) and liver function (LFTs)  ?? Monitor for signs/symptoms of adverse events (e.g., hyperglycemia, hyperkalemia, hypomagnesemia, hypertension, headache, tremor)    Results:   Tacrolimus level: 5.2 ng/mL, drawn at midnight    Pharmacokinetic Considerations and Significant Drug Interactions:  ??? Concurrent hepatotoxic medications: None identified  ??? Concurrent CYP3A4 substrates/inhibitors: None identified  ??? Concurrent nephrotoxic medications: None identified    Assessment/Plan:  Recommendedation(s)  ??? Increase to 24 mg Envarsus daily   ??? Decrease Myfortic to 180 mg BID for recurrent CDiff    Follow-up  ??? Daily levels - ordered.   ??? A pharmacist will continue to monitor and recommend levels as appropriate    Please page service pharmacist with questions/clarifications.    Vladimir Faster, PharmD

## 2019-10-02 NOTE — Unmapped (Signed)
Nephrology ([facility]) History & Physical    Assessment & Plan:   Elaine Koch is a 34 y.o. 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     Active Problems:    * No active hospital problems. *  Resolved Problems:    * No resolved hospital problems. *    LLQ Pain: tender to palpation over the LLQ in the region of her allograft. The etiology of this is unclear at this time. Her transplant renal US was unremarkable without hydronephrosis, stones, perinephric fluid collections. Her UA was unremarkable with negative LE/nitrites and only 3 WBCs - UCx pending. Differential would include transplant pyelo (though UA reassuring and no other urinary symptoms), rejection (unlikely with stable renal function), colitis (no current symptoms), or adnexal pathology. Would cover empirically for pyelo with antibiotics pending culture data, though hesitant to do so given prior severe C diff symptoms, current fidaxomicin use and potential need for fecal transplant. Monitor exam closely. If worsening pain, fever, or other symptoms, will obtain CT A/P.   -follow blood and urine cultures   -CT A/P if pain persists       Fevers - Myalgias - Headaches: following LLQ pain, has had low grade fevers, myalgias, and headaches. Syndrome could be consistent with viral etiology. No known sick contacts. Flu/RSV/COVID-19 negative. Blood and urine cultures obtained. Will obtain CXR along with CMV/EBV/BK viral loads. Holding antibiotics as above in setting of complicated C diff infection and no clear infectious source.   -follow blood and urine cultures  -CXR  -CMV quant  -EBV quant   -BK quant     ESRD 2/2 T1DM s/p Kidney/Pancreas Transplant: transplanted in March 2021. Renal function at baseline creatinine 1.5. Lipase normal, glucose normal indicating excellent graft function.   -continue home envarsus 22mg  daily   -check daily tac troughs  -continue myfortic 360mg  BID  -continue prednisone 5mg  daily   -continue bactrim prophylaxis for pneumocystis M/W/F    C Diff infection: positive 07/14/2019. She has had a complicated course including relapse on PO vanc taper. She is followed by immunocompromised ID. S/p bezlotoxumab 8/6 and continues on fidaxomicin 200mg  daily.   -continue fidaxomicin     Daily Checklist:  Diet: Regular Diet  DVT PPx: Not Indicated - Padua Score <4  Electrolytes: No Repletion Needed  Code Status: Full Code    Chief Concern:   No Principal Problem: There is no principal problem currently on the Problem List. Please update the Problem List and refresh.    Subjective:   HPI:  Elaine Koch is a 34 y.o. 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     She reports being in her usual state of health until Monday, when she began to experience some cramping pain in the LLQ. She initially thought it was menstrual cramps. The pain persisted. She started having headaches early in the week as well, mostly right frontal. She felt these were consistent with her normal migraine headaches. She also noticed some myalgias. On Thursday night, she began having a low grade fever, Tmax 100.5. She presented to the ED for evaluation.     On arrival, Tmax 37.8. Mildly tachycardic in the 100s which resolved with  fluids. Normotensive, not hypoxic. Labs with mild leukocytosis with WBC 13.5. BMP with stable renal function - creatinine 1.5. UA with negative leukocyte esterase, negative nitrites, 3 WBCs. Transplant renal US unremarkable. Blood and urine cultures were obtained. She received 2L IVF. No antibiotics given in the ED.     At the bedside, she does note LLQ tenderness to palpation. She denies dysuria, increased urinary frequency, hematuria. No suprapubic pain. She is overall feeling a little better since arriving to the ED. She notes that her C diff symptoms have been stable lately. She is having 2 bowel movements per day, last Friday morning. No bloody bowel movements. She was feeling nauseous earlier and had one episode of emesis in the ED. Not nauseous on my interview.    Current transplant regimen:   -envarsus 22mg  daily   -myfortic 360 BID  -pred 5     Designated Healthcare Decision Maker:  Ms. Kipnis currently has decisional capacity for healthcare decision-making and is able to designate a surrogate healthcare decision maker. Ms. Stiner designated healthcare decision maker(s) is the patient's mother as denoted by stated patient preference.    Allergies:  Iodinated contrast media, Nickel, Propranolol, Eye irrigating solution [ophthalmic irrigation solution], Iodine, Naltrexone, and Uni-cortrom    Medications:   Prior to Admission medications    Medication Dose, Route, Frequency   acetaminophen (TYLENOL) 500 MG tablet Take 1-2 tablets (500-1,000 mg total) by mouth every six (6) hours as needed for pain or fever greater than 100.31F (38C).   aspirin (ECOTRIN) 81 MG tablet 81 mg, Oral, Daily (standard)   biotin 5 mg tablet 5 mg, Oral, Daily   cholecalciferol, vitamin D3-125 mcg, 5,000 unit,, 125 mcg (5,000 unit) capsule 125 mcg, Oral, Daily (standard)   fidaxomicin (DIFICID) 200 mg tablet 200 mg, Oral, Daily, C-diff   mycophenolate (MYFORTIC) 180 MG EC tablet 360 mg, Oral, 2 times a day (standard)   predniSONE (DELTASONE) 5 MG tablet 5 mg, Oral, Daily (standard)   sodium bicarbonate 650 mg tablet 1,300 mg, Oral, 3 times a day (standard)   sulfamethoxazole-trimethoprim (BACTRIM) 400-80 mg per tablet 1 tablet, Oral, Every Mon-Wed-Fri   tacrolimus (ENVARSUS XR) 1 mg Tb24 extended release tablet Take 2 tablets (2 mg total) by mouth once daily with 5 (4 mg) tablets for total dose of 22 mg daily   tacrolimus (ENVARSUS XR) 4 mg Tb24 extended release tablet Take 5 tablets (20 mg total) by mouth once daily with 2 (1 mg) tablets for total dose of 22 mg daily   vancomycin (VANCOCIN) 125 MG capsule 4x/d for 14d, 2x/d for 7d, 1x/d for 7d, every 2d x 7d, every 3d x 14d   multivitamin, prenatal, folic acid-iron, 27-1 mg Tab 1 tablet, Oral, Daily (standard)       Medical History:  Past Medical History:   Diagnosis Date   ??? Chronic hypertension during pregnancy, antepartum 07/22/2015    Overview:  Methyldopa recommended per nephrologist if needed   ??? Diabetes mellitus type 1 (CMS-HCC)    ??? ESRD (end stage renal disease) (CMS-HCC)    ??? History of pre-eclampsia 10/24/2016   ??? 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 Stage 5 chronic kidney disease on chronic dialysis (CMS-HCC) 10/24/2016       Surgical History:  Past Surgical History:   Procedure Laterality Date   ??? AV FISTULA PLACEMENT  2018   ??? CESAREAN SECTION     ??? PR TRANSPLANT ALLOGRAFT PANCREAS N/A 05/03/2019  Procedure: TRANSPLANTATION OF PANCREATIC ALLOGRAFT;  Surgeon: Leona Carry, MD;  Location: MAIN OR [facility];  Service: Transplant   ??? PR TRANSPLANT,PREP CADAVER RENAL GRAFT N/A 05/03/2019    Procedure: [facility] STD PREP CAD DONR RENAL ALLOGFT PRIOR TO TRNSPLNT, INCL DISSEC/REM PERINEPH FAT, DIAPH/RTPER ATTAC;  Surgeon: Leona Carry, MD;  Location: MAIN OR [facility];  Service: Transplant   ??? PR TRANSPLANT,PREP DONOR PANCREAS N/A 05/03/2019    Procedure: [facility] STANDARD PREPARATION OF CADAVER DONOR PANCREAS ALLOGRAFT PRIOR TO TRANSPLANTATION;  Surgeon: Leona Carry, MD;  Location: MAIN OR [facility];  Service: Transplant   ??? PR TRANSPLANTATION OF KIDNEY N/A 05/03/2019    Procedure: RENAL ALLOTRANSPLANTATION, IMPLANTATION OF GRAFT; WITHOUT RECIPIENT NEPHRECTOMY;  Surgeon: Leona Carry, MD;  Location: MAIN OR [facility];  Service: Transplant   ??? TONSILLECTOMY       Family History:   Family History   Problem Relation Age of Onset   ??? Diabetes Mother    ??? Diabetes Father    ??? Cancer Maternal Grandmother    ??? Diabetes Maternal Grandfather    ??? Diabetes Paternal Grandmother        Social History:  The patient lives with her 94 year old son    Social History     Tobacco Use   ??? Smoking status: Never Smoker   ??? Smokeless tobacco: Never Used   Vaping Use   ??? Vaping Use: Never used   Substance Use Topics   ??? Alcohol use: Never   ??? Drug use: Never        Review of Systems:  10 systems were reviewed and are negative unless otherwise mentioned in the HPI    Objective:   Physical Exam:  Temp:  [37.6 ??C-37.8 ??C] 37.8 ??C  Heart Rate:  [91-98] 91  SpO2 Pulse:  [91-103] 91  Resp:  [16-21] 19  BP: (116-150)/(71-93) 141/77  SpO2:  [96 %-98 %] 96 %    Gen: WDWN female in NAD, answers questions appropriately  Eyes: Sclera anicteric  HENT: atraumatic, normocephalic  Heart: RRR  Lungs: CTAB  Abdomen: soft, nondistended, moderate tenderness over LLQ   Extremities: no LE edema   Neuro: CN II-XI grossly intact, normal cerebellar function, normal gait. No focal deficits.  Skin:  No rashes, lesions on clothed exam  Psych: Alert, oriented, normal mood and affect.     Labs/Studies/Imaging:  Labs, Studies, Imaging from the last 24hrs per EMR and personally reviewed

## 2019-10-02 NOTE — Unmapped (Addendum)
Elaine Koch is a 34 y.o. 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   ??  LLQ Pain, Fevers, Myalgias, N/V in Immunocompromised Host: Admitted on 8/21 with a five day history of bilateral lower pelvic cramps that then localized to LLQ, with associated headache, myalgias, and 1-2 days of fevers to 100-100.46F. In ED, febrile to 38.3C with TTP over LLQ in region of her allograft and had nausea with 1-2 episodes of vomiting, but HDS and mentating well. Denied SOB, vaginal discharge, diarrhea. Although UA showed 3 WBC, her UCx grew 50,000-100,000 CFU/mL of Enterococcus faecium. After consulting with Immunocompromised Infectious Disease, they thought this was unlikely to be transplant pyelonephritis. Otherwise, unclear source with negative imaging (CT C/A/P, TTE, Renal US all not c/f infection; Transvaginal US not c/f ovarian torsion, cyst, or ruptured cyst) and infectious disease workup (Flu/RSV/COVID, RPP, BCx at 24 hours) all negative.     We recommended staying 1-2 more days, but given patient's child care constraints, the patient requested to leave. We discussed the risks of discharging before finding a source of her fever, and she endorsed understanding and agreed to close follow-up with ICID. We discharged her in her hemodynamically stable state after ruling out the most life-threatening infections with plan for close outpatient follow-up. Per ICID recommendations we discharged patient with 10 days of Linezolid for urine culture weakly positive for enterococcus.    ??  Fevers - Myalgias - Headaches: following LLQ pain, has had low grade fevers, myalgias, and headaches. Syndrome could be consistent with viral etiology. No known sick contacts. Flu/RSV/COVID-19 negative. Blood and urine cultures obtained. CXR showed diffuse and retrocardiac opacities, will get CT Chest and serum viral studies. Holding antibiotics as above in setting of complicated C diff infection and no clear infectious source, unless crumps/fevers/concerning CT Chest.   ??  ESRD 2/2 T1DM s/p Kidney/Pancreas Transplant: Transplanted in March 2021. Renal function at baseline creatinine 1.5. Lipase normal, glucose normal indicating excellent graft function. Increased home envarsus from 22mg  to 24 mg daily, decreased myfortic from 360mg  BID to 180mg  BID. Continued prednisone 5mg  daily and bactrim 400-80 MWF.   ??  C Diff infection: positive 07/14/2019, course complicated by relapse on PO vanc taper. S/p bezlotoxumab 8/6, followed by ICID on fidoxamicin 200mg  daily with plan for fecal transplant by GI.

## 2019-10-02 NOTE — Unmapped (Signed)
[facility]  Emergency Department Provider Note    Initial Impression, ED Course, Assessment and Plan     Impression: 34 y.o. 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  Initial presentation: Heart rate 98, afebrile, normotensive.  On exam: no nuchal rigidity or meningismus, lungs CTAB, left lower quadrant tenderness and suprapubic tenderness present, no CVA tenderness.       BP 116/71  - Temp 37.6 ??C (99.7 ??F) (Temporal)  - Resp 19  - Wt 79.8 kg (176 lb)  - LMP 04/19/2019 (Within Weeks)  - SpO2 98%  - Breastfeeding No  - BMI 29.29 kg/m??     EKG interpreted by me: Normal sinus rhythm, rate 93, axis normal, wandering baseline present, no ST elevation.     Medical Record Review:   Reviewed discharge summary from 07/16/2019.  -Patient was admitted to transplant surgery for AKI and C. difficile colitis.  -Was treated with oral vancomycin.  -Immunosuppression regimen consists of tacrolimus, mycophenolate, and prednisone.    Assessment/Plan:   Given h/o immunosuppression, will pursue septic workup in addition to renal US of transplanted kidney. Antibiotics held for now given pt non-toxic and afebrile. Will reassess need for antibiotics per workup. IVF, antiemetics and analgesia ordered.     ED Course:   [phone]: I discussed patient's case with nephrology who feels patient can be admitted for observation.  Requesting AM tacro level.  Page out MAO.    0534: Discussed case with MED B.  They are agreeable to admission.    MDM/Disposition:   This is a 34 y.o. female with h/o end-stage renal disease status post kidney and pancreas transplant (immunosuppresed) in March 2021 who is presenting to emergency department today for fever, headache, nausea, vomiting and abdominal pain over her transplant site for the past 48 hours.  Differential diagnosis includes UTI versus pyelonephritis versus sepsis versus transplant rejection among multiple other potential etiologies.  Antibiotics initially withheld pending start of work-up as patient is nontoxic in appearance and is borderline tachycardic.  Will reassess patient following fluids and work-up.    Upon completion of workup: no UTI, COVID negative, mild leukocytosis, lactate unremarkable, renal US stable. I reassessed patient and though her workup is overall reassuring, she continues to c/o ongoing abdominal pain and has had several episodes of emesis in ED. Given her h/o immunosuppresion and mild leukocytosis with slightly up-trending temp (99.7-->100.1), plan to admit pt for further observation as well as trending blood and urine cultures. Patients case has been discussed with nephrology and admitting service. Pt notified of results and plan for admission. She is agreeable to plan of care.     _____________________________________________________    The case was discussed with the attending physician who is in agreement with the above assessment and plan.    ED Clinical Impression     Final diagnoses:   Left lower quadrant abdominal pain (Primary)   History of simultaneous kidney and pancreas transplant (CMS-HCC)   Immunosuppression (CMS-HCC)       Additional Medical Decision Making     Time seen: October 01, 2019 11:18 PM    I have reviewed the vital signs and the nursing notes. Labs and radiology results that were available during my care of the patient were independently reviewed by me and considered in my medical decision making.  I independently visualized the EKG tracing if performed  I independently visualized the radiology images if performed  I reviewed the patient's prior medical records if available.  Additional history obtained from family if available    Please note- This chart has been created using [employer]. Chart creation errors have been sought, but may not always be located and such creation errors, especially pronoun confusion, do NOT reflect on the standard of medical care.      History     Chief Complaint  Flank Pain and Fever Immunocompromised      History provided by patient.     HPI   Elaine Koch is a 34 y.o. female with h/o T1DM, ESRD s/p kidney and pancreas transplant in March 2021 (on Tacro/MMF/Prednisone) who presents to the ED today for multiple complaints. She reports her symptoms started with subjective fever and frontal headache approximately 48 hours ago. She describes her headache as a dull sensation in her forehead without radiation or modifying factors. Current severity 8/10. She subsequently developed nausea and LLQ pain. Last BM was earlier this morning and was normal per pt. She denies chills, neck stiffness, visual disturbances, unilateral numbness/weakness, chest pain, diaphoresis, SOB, cough, congestion, diarrhea, rashes, dysuria, hematuria, or hematochezia.     ROS - 10 point review of systems was performed and is negative unless otherwise noted in HPI.    CONST: + fever  NEURO: + headache  NECK: Negative for neck stiffness or neck pain.   ENT: Negative for congestion or sore throat.  CV: Negative for chest pain or palpitations.   RESP: Negative for cough or shortness of breath.  GI: +nausea, abdominal pain  HEME: Negative for easy bruising or bleeding.   GU: Negative for dysuria or hematuria.   SKIN: Negative for rashes.       PAST MEDICAL HISTORY/PAST SURGICAL HISTORY:   Past Medical History:   Diagnosis Date   ??? Chronic hypertension during pregnancy, antepartum 07/22/2015    Overview:  Methyldopa recommended per nephrologist if needed   ??? Diabetes mellitus type 1 (CMS-HCC)    ??? ESRD (end stage renal disease) (CMS-HCC)    ??? History of pre-eclampsia 10/24/2016   ??? 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 Stage 5 chronic kidney disease on chronic dialysis (CMS-HCC) 10/24/2016       Patient Active Problem List   Diagnosis   ??? Anemia, chronic disease   ??? Diabetic nephropathy (CMS-HCC)   ??? Diabetic retinopathy (CMS-HCC)   ??? Nephrotic syndrome   ??? Type 1 diabetes mellitus (CMS-HCC)   ??? Sickle cell trait (CMS-HCC)   ??? Other secondary hypertension   ??? History of blood transfusion   ??? Cardiomyopathy (CMS-HCC)   ??? History of simultaneous kidney and pancreas transplant (CMS-HCC)   ??? Hypotension   ??? 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   ??? AKI (acute kidney injury) (CMS-HCC)   ??? Nausea & vomiting   ??? Diarrhea   ??? Neutropenia associated with infection (CMS-HCC)   ??? Neutropenia, drug-induced (CMS-HCC)   ??? Clostridium difficile infection       Past Surgical History:   Procedure Laterality Date   ??? AV FISTULA PLACEMENT  2018   ??? CESAREAN SECTION     ??? PR TRANSPLANT ALLOGRAFT PANCREAS N/A 05/03/2019    Procedure: TRANSPLANTATION OF PANCREATIC ALLOGRAFT;  Surgeon: Leona Carry, MD;  Location: MAIN OR [facility];  Service: Transplant   ??? PR TRANSPLANT,PREP CADAVER RENAL GRAFT N/A 05/03/2019  Procedure: BACKBNCH STD PREP CAD DONR RENAL ALLOGFT PRIOR TO TRNSPLNT, INCL DISSEC/REM PERINEPH FAT, DIAPH/RTPER ATTAC;  Surgeon: Leona Carry, MD;  Location: MAIN OR [facility];  Service: Transplant   ??? PR TRANSPLANT,PREP DONOR PANCREAS N/A 05/03/2019    Procedure: [facility] STANDARD PREPARATION OF CADAVER DONOR PANCREAS ALLOGRAFT PRIOR TO TRANSPLANTATION;  Surgeon: Leona Carry, MD;  Location: MAIN OR [facility];  Service: Transplant   ??? PR TRANSPLANTATION OF KIDNEY N/A 05/03/2019    Procedure: RENAL ALLOTRANSPLANTATION, IMPLANTATION OF GRAFT; WITHOUT RECIPIENT NEPHRECTOMY;  Surgeon: Leona Carry, MD;  Location: MAIN OR [facility];  Service: Transplant   ??? TONSILLECTOMY         MEDICATIONS:   No current facility-administered medications for this encounter.    Current Outpatient Medications:   ???  acetaminophen (TYLENOL) 500 MG tablet, Take 1-2 tablets (500-1,000 mg total) by mouth every six (6) hours as needed for pain or fever greater than 100.49F (38C)., Disp: 100 tablet, Rfl: 0  ???  aspirin (ECOTRIN) 81 MG tablet, Take 1 tablet (81 mg total) by mouth daily., Disp: 30 tablet, Rfl: 11  ???  biotin 5 mg tablet, Take 1 tablet (5 mg total) by mouth daily., Disp: 30 tablet, Rfl: 11  ???  cholecalciferol, vitamin D3-125 mcg, 5,000 unit,, 125 mcg (5,000 unit) capsule, Take 1 capsule (125 mcg total) by mouth daily., Disp: 100 capsule, Rfl: 10  ???  fidaxomicin (DIFICID) 200 mg tablet, Take 1 tablet (200 mg total) by mouth every morning before breakfast. C-diff, Disp: 30 tablet, Rfl: 0  ???  multivitamin, prenatal, folic acid-iron, 27-1 mg Tab, Take 1 tablet by mouth daily., Disp: , Rfl:   ???  mycophenolate (MYFORTIC) 180 MG EC tablet, Take 2 tablets (360 mg total) by mouth Two (2) times a day., Disp: 120 tablet, Rfl: 11  ???  predniSONE (DELTASONE) 5 MG tablet, Take 1 tablet (5 mg total) by mouth daily., Disp: 30 tablet, Rfl: 11  ???  sodium bicarbonate 650 mg tablet, Take 2 tablets (1,300 mg total) by mouth Three (3) times a day., Disp: 180 tablet, Rfl: 3  ???  sulfamethoxazole-trimethoprim (BACTRIM) 400-80 mg per tablet, Take 1 tablet (80 mg of trimethoprim total) by mouth Every Monday, Wednesday, and Friday., Disp: 12 tablet, Rfl: 2  ???  tacrolimus (ENVARSUS XR) 1 mg Tb24 extended release tablet, Take 2 tablets (2 mg total) by mouth once daily with 5 (4 mg) tablets for total dose of 22 mg daily, Disp: 60 tablet, Rfl: 11  ???  tacrolimus (ENVARSUS XR) 4 mg Tb24 extended release tablet, Take 5 tablets (20 mg total) by mouth once daily with 2 (1 mg) tablets for total dose of 22 mg daily, Disp: 150 tablet, Rfl: 11  ???  vancomycin (VANCOCIN) 125 MG capsule, 4x/d for 14d, 2x/d for 7d, 1x/d for 7d, every 2d x 7d, every 3d x 14d, Disp: 85 capsule, Rfl: 1    ALLERGIES:   Iodinated contrast media, Nickel, Propranolol, Eye irrigating solution [ophthalmic irrigation solution], Iodine, Naltrexone, and Uni-cortrom    SOCIAL HISTORY:   Social History     Tobacco Use   ??? Smoking status: Never Smoker   ??? Smokeless tobacco: Never Used   Substance Use Topics   ??? Alcohol use: Never       FAMILY HISTORY:  Family History   Problem Relation Age of Onset   ??? Diabetes Mother    ??? Diabetes Father    ??? Cancer Maternal Grandmother    ??? Diabetes Maternal  Grandfather    ??? Diabetes Paternal Grandmother           Physical Exam     ED Triage Vitals [10/01/19 2205]   Enc Vitals Group      BP 116/71      Pulse       SpO2 Pulse 103      Resp 19      Temp 37.6 ??C (99.7 ??F)      Temp Source Temporal      SpO2 98 %      Weight 79.8 kg (176 lb)      Height       Head Circumference       Peak Flow       Pain Score       Pain Loc       Pain Edu?       Excl. in GC?        EXAM     CONST: Well appearing, well nourished, appears stated age, NAD.  HEENT: Simpson/AT, conjunctivae clear, anicteric sclera. Moist mucous membranes.   NECK: Trachea midline. No evidence of suprasternal retractions.   CV: RRR, no m/g/r. 2+ radial pulses bilaterally. No pedal edema.  RESP: Normal respiratory effort. Lungs clear to auscultation bilaterally without wheezes or crackles.  ABD: Abdomen soft with mild LLQ and suprapubic tenderness to palpation. Periumbilical surgical scar.  MSK: No gross joint deformities.  SKIN: Warm, dry and intact.   HEME: No petechiae or bruising.  PSYCH: Mood and affect are normal.  NEURO: A&Ox3. CN II-XII intact. Speech is fluent and clear. No facial droop. 5/5 strength and sensation in all extremities. No pronator drift. Normal FTN. Normal heel to shin. Moves all extremities spontaneously.         Radiology     US Renal Transplant W Doppler   Preliminary Result   No significant change compared with prior study. Stable resistive indices in the renal transplant arteries, within normal limits.      Mildly distended bladder.      Please see below for data measurements:         Transplant location: LLQ      Renal Transplant: length 11.6 cm; width 5.2 cm; height 5.3 cm Arcuate artery superior resistive index: .63   Arcuate artery mid resistive index: .65   Arcuate artery inferior resistive index: .61      Previous resistive indices range of arcuate arteries: .70-.76      Segmental artery superior resistive index: .70   Segmental artery mid resistive index: .67   Segmental artery inferior resistive index: .71      Previous resistive indices range of segmental arteries: .69-.74      Main renal artery hilum resistive index: .73   Main renal artery mid resistive index: .81   Main renal artery anastomosis resistive index: .75      Previous resistive indices range of main renal artery: .65-.84      Main renal vein: patent      Iliac artery: Patent   Iliac vein: Patent      Bladder volume prevoid: 294.4  mL      LABORATORY DATA:     Results for orders placed or performed during the hospital encounter of 10/01/19   RAPID INFLUENZA/RSV/COVID PCR    Specimen: Nasopharyngeal Swab   Result Value Ref Range    Influenza A Negative Negative    Influenza B Negative Negative    RSV Negative Negative  SARS-CoV-2 PCR Negative Negative   Comprehensive Metabolic Panel   Result Value Ref Range    Sodium 139 135 - 145 mmol/L    Potassium 4.5 3.4 - 4.5 mmol/L    Chloride 107 98 - 107 mmol/L    Anion Gap 7 5 - 14 mmol/L    CO2 25.0 20.0 - 31.0 mmol/L    BUN 16 9 - 23 mg/dL    Creatinine 1.61 (H) 0.60 - 0.80 mg/dL    BUN/Creatinine Ratio 10     EGFR CKD-EPI Non-African American, Female 45 (L) >=60 mL/min/1.93m2    EGFR CKD-EPI African American, Female 52 (L) >=60 mL/min/1.23m2    Glucose 102 70 - 179 mg/dL    Calcium 9.9 8.7 - 09.6 mg/dL    Albumin 3.5 3.4 - 5.0 g/dL    Total Protein 7.7 5.7 - 8.2 g/dL    Total Bilirubin 1.2 0.3 - 1.2 mg/dL    AST 27 <=04 U/L    ALT 45 10 - 49 U/L    Alkaline Phosphatase 76 46 - 116 U/L   Lactate Sepsis, Venous   Result Value Ref Range    Lactate, Venous 1.0 0.5 - 1.8 mmol/L   Urinalysis with Culture Reflex    Specimen: Clean Catch; Urine   Result Value Ref Range Color, UA Yellow     Clarity, UA Clear     Specific Gravity, UA 1.017 1.003 - 1.030    pH, UA 6.0 5.0 - 9.0    Leukocyte Esterase, UA Negative Negative    Nitrite, UA Negative Negative    Protein, UA Negative Negative    Glucose, UA Negative Negative    Ketones, UA Trace (A) Negative    Urobilinogen, UA 0.2 mg/dL 0.2 mg/dL, 1.0 mg/dL    Bilirubin, UA Negative Negative    Blood, UA Negative Negative    RBC, UA <1 <=4 /HPF    WBC, UA 3 0 - 5 /HPF    Squam Epithel, UA 4 0 - 5 /HPF    Bacteria, UA Rare (A) None Seen /HPF    Hyaline Casts, UA 1 0 - 1 /LPF   Pregnancy Qualitative, Urine   Result Value Ref Range    Pregnancy Test, Urine Negative Negative   Lipase Level   Result Value Ref Range    Lipase 21 12 - 53 U/L   Protime-INR   Result Value Ref Range    PT 14.5 (H) 10.5 - 13.5 sec    INR 1.23    aPTT   Result Value Ref Range    APTT 33.6 24.9 - 36.9 sec    Heparin Correlation 0.2    Blood Gas, Venous   Result Value Ref Range    Specimen Source Venous     FIO2 Venous Room Air     pH, Venous 7.39 7.32 - 7.43    pCO2, Ven 42 40 - 60 mm Hg    pO2, Ven 34 30 - 55 mm Hg    HCO3, Ven 25 22 - 27 mmol/L    Base Excess, Ven 0.5 -2.0 - 2.0    O2 Saturation, Venous 60.7 40.0 - 85.0 %   CBC w/ Differential   Result Value Ref Range    WBC 13.5 (H) 4.5 - 11.0 10*9/L    RBC 4.13 4.00 - 5.20 10*12/L    HGB 12.9 12.0 - 16.0 g/dL    HCT 54.0 98.1 - 19.1 %    MCV 93.1 80.0 - 100.0 fL  MCH 31.2 26.0 - 34.0 pg    MCHC 33.5 31.0 - 37.0 g/dL    RDW 16.1 09.6 - 04.5 %    MPV 8.7 7.0 - 10.0 fL    Platelet 223 150 - 440 10*9/L    Neutrophils % 87.0 %    Lymphocytes % 3.4 %    Monocytes % 7.3 %    Eosinophils % 0.3 %    Basophils % 0.1 %    Absolute Neutrophils 11.7 (H) 2.0 - 7.5 10*9/L    Absolute Lymphocytes 0.5 (L) 1.5 - 5.0 10*9/L    Absolute Monocytes 1.0 (H) 0.2 - 0.8 10*9/L    Absolute Eosinophils 0.1 0.0 - 0.4 10*9/L    Absolute Basophils 0.0 0.0 - 0.1 10*9/L    Large Unstained Cells 2 0 - 4 %          Rosita Fire, MD  Resident  10/02/19 [phone]

## 2019-10-02 NOTE — Unmapped (Signed)
Presents with HA, L flank pain and LUQ abd pain that 2 days ago accompanied with fevers (highest 100F). PMH kidney/pancreas transplant 3/21 and Cdiff. Emesis x1.

## 2019-10-02 NOTE — Unmapped (Signed)
Received page from patient at 1925 with ongoing complaints of temp 99.6-100 while taking tylenol every 4 hours, ongoing nausea and vomiting with the worst headache. She denied any diarrhea. She reports she is taking her meds OK and drinking OK.  She also reported increased, constant pain over her graft site.  She denied any UTI symptoms, however. She denied any COVID positive contacts or known exposures.     Instructed patient to go to the ED, preferably [facility] ED, to be evaluated for possible pyelonephritis/UTI and also COVID testing. She stated she would go to [facility] ED.

## 2019-10-02 NOTE — Unmapped (Signed)
NEW IMMUNOCOMPROMISED HOST INFECTIOUS DISEASE CONSULT NOTE      Elaine Koch is being seen in consultation at the request of Manish Caren Hazy, MBBS for evaluation of fever, headaches unclear source    Assessment/Recommendations:    Elaine Koch is a 34 y.o. female     Fever unclear source. LLQ/L flank pain but no acute intraabdominal abnormality on CT A/P. Transvaginal US didn't reveal GYN abnormality. LLQ pain may not be related to her fever. Given non-specific mild nausea with abdominal pain and fever, would favor viral infection. Okay to hold off of ABx. Would send some viral studies as below.    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  # Mild post-transplant CKD  Estimated Creatinine Clearance: 56.7 mL/min (A) (based on SCr of 1.49 mg/dL (H)).    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, clinical relapse 07/28/2019, 08/23/2019, 09/06/2019  - sx recurred after vanc tx, retreated with vanc and taper but relapsed dropping from vanc QID to vancomycin Q2days  - 08/27/19-7/26 fidaxomicin  - 8/2 relapse on vancomycin taper   - 8/4 change to fidaxomicin once daily  - 8/6 bezlotoxumab    # Fever, L flank pain, LUQ pain unclear source 09/29/19  -8/20 fever T 38.3  -8/21 CT A/P no acute pathology within abdomen/pelvis  -8/21 CXR Streaky linear bibasilar opacities and patchy left retrocardiac opacity  -8/21 CT chest no PNA  -8/21 TransVg [place] No evidence of ovarian torsion, no abnormal pelvic free fluid.       RECOMMENDATIONS FOR 10/02/2019    Diagnostic  ??? F/u BCx, UCx  ??? Send RPP  ??? Send serum adenovirus, HSV, VZV, enterovirus, norovirus PCR  ??? F/up BK virus, CMV, EBV  ??? Send HIV Ag/Ab    Treatment  ??? Hold off antibiotics for fever unless she develops sepsis; in that case would start vancomycin IV and cefepime  ??? Continue fidaxomicin 200mg  daily for recurrent C.diff suppression  ??? Please obtain transvaginal ultrasound of ovaries to evaluate for torsion          The [facility] ID service will continue to follow.  Please page the ID Transplant/Liquid Oncology Fellow consult at [phone] with questions.  Patient discussed with Dr. Kari Baars    Theppharit Cherylynn Ridges, MD  Fellow, [facility] Infectious Diseases    _______________________________________________________________________    Attending attestation  I saw and evaluated the patient. I discussed and agree with the findings and the plan of care as documented in the fellow???s note.The patient is at risk of decompensation from infection due to underlying immunocompromise and/or mucosal barrier defects. The risks and benefits of initiating and/or continuing potentially toxic anti-infective therapies and diagnostic procedures were discussed with the care teams.     I personally reviewed updated microbiological culture and susceptibility data.     I personally reviewed updated relevant radiological studies.    Patient reports that she has not been sexually active since the week before her transplant and she used condoms at that time. She denies abnormal vaginal discharge. She did remember that last year she had had an IUD in place but that when her  doctors went to remove it, it was no longer there. Will eval for STIs. CT A/P without evidence for IUD, ovaries not commented on. Vaginal ultrasound unremarkable, kidney ultrasound unremarkable. Pain is in LLQ and is not localized to upper abdomen at all. At this point, if cultures remain negative, would presume a viral infection (COVID PCR neg). She eats takeout food all the time. Her 61 y old son lives with her and he has been around 1-2 other children but does not go to daycare; he has been in good health. She denies any known sick contacts. No raw or undercooked foods. Alternative diagnosis would be drug fever which is less likely as no new medications started. Or also graft rejection although labs not indicative of this. Will eval for viral etiologies.     [name], MD  Immunocompromised ID  Pager [phone]  _______________________________________________________________________      History of Present Illness:      Source of information includes:  Electronic Medical Records.  History obtained from:patient.    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     She states that she developed new onset of LLQ pain/L flank pain on 09/29/19. Pain is constant. Pain score 4-5/10. Pain also radiates L flank area. Reports intermittent headaches. Denies blurry vision, neck pain, weakness, numbness. Reports associated myalgia, fatigue, decreased appetite. Reports mild nausea with one episode of emesis in the ED. Denies diarrhea. Takes fidaxomicin daily per prescribed.    Allergies:  Allergies   Allergen Reactions   ??? Iodinated Contrast Media Swelling, Rash and Other (See Comments)     Burning, warmth throughout body and tingling.  Throat swelling.  Treated with benadryl and symptoms improved.  Has not had contrast since then (2013 or 2014).   ??? Nickel Rash   ??? Propranolol Swelling   ??? Eye Irrigating Solution [Ophthalmic Irrigation Solution] Other (See Comments)     Contrast dye (name?) used in eyes caused hot feeling in face, reversed with Benadryl.    ??? Iodine      Other reaction(s): Skin Rash   ??? Naltrexone Rash   ??? Uni-Cortrom Rash       Medications:   Antimicrobials:  Anti-infectives (From admission, onward)    Start     Dose/Rate Route Frequency Ordered Stop    10/04/19 0900  sulfamethoxazole-trimethoprim (BACTRIM) 400-80 mg tablet 80 mg of trimethoprim      1 tablet Oral Every Mon-Wed-Fri 10/02/19 0707 11/03/19 0859    10/02/19 1053  fidaxomicin (DIFICID) tablet 200 mg      200 mg Oral Daily (standard) 10/02/19 0707 11/01/19 0859          Current/Prior immunomodulators:  Tac, MMF    Other medications reviewed.     Medical History:  Past Medical History:   Diagnosis Date   ??? Chronic hypertension during pregnancy, antepartum 07/22/2015    Overview:  Methyldopa recommended per nephrologist if needed   ??? Diabetes mellitus type 1 (CMS-HCC)    ??? ESRD (end stage renal disease) (CMS-HCC)    ??? History of pre-eclampsia 10/24/2016   ??? 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 Stage 5 chronic kidney disease on chronic dialysis (CMS-HCC) 10/24/2016       Surgical History:  Past Surgical History:   Procedure Laterality Date   ??? AV FISTULA  PLACEMENT  2018   ??? CESAREAN SECTION     ??? PR TRANSPLANT ALLOGRAFT PANCREAS N/A 05/03/2019    Procedure: TRANSPLANTATION OF PANCREATIC ALLOGRAFT;  Surgeon: Leona Carry, MD;  Location: MA[facility];  Service: Transplant   ??? PR TRANSPLANT,PREP CADAVER RENAL GRAFT N/A 05/03/2019    Procedure: [facility] STD PREP CAD DONR RENAL ALLOGFT PRIOR TO TRNSPLNT, INCL DISSEC/REM PERINEPH FAT, DIAPH/RTPER ATTAC;  Surgeon: Leona Carry, MD;  Location: MA[facility];  Service: Transplant   ??? PR TRANSPLANT,PREP DONOR PANCREAS N/A 05/03/2019    Procedure: [facility] STANDARD PREPARATION OF CADAVER DONOR PANCREAS ALLOGRAFT PRIOR TO TRANSPLANTATION;  Surgeon: Leona Carry, MD;  Location: [facility];  Service: Transplant   ??? PR TRANSPLANTATION OF KIDNEY N/A 05/03/2019    Procedure: RENAL ALLOTRANSPLANTATION, IMPLANTATION OF GRAFT; WITHOUT RECIPIENT NEPHRECTOMY;  Surgeon: Leona Carry, MD;  Location: MAIN OR [facility];  Service: Transplant   ??? TONSILLECTOMY         Social History:  Tobacco use:   reports that she has never smoked. She has never used smokeless tobacco.   Alcohol use:    reports no history of alcohol use.   Drug use:    reports no history of drug use.   Living situation:  Lives with family in [place]   Residence:   city   Birth place  [place]   [place] travel:   No [place] travel outside of [employer] travel:   No travel outside of the [place] Engineer, maintenance service:     Employment:  Employed as work from home   Pets and animal exposure:  No Financial controller exposure:  No tick exposure   Hobbies:  Denies unusual environmental exposures   TB exposures:  No known TB exposure and No family history of TB   Sexual history:  Sex with men   Other significant exposures:  No exposure to well water and No exposure to unpasteurized daily products     Family History:  no recent sick contacts in family and no history active TB in a family member  Family History   Problem Relation Age of Onset   ??? Diabetes Mother    ??? Diabetes Father    ??? Cancer Maternal Grandmother    ??? Diabetes Maternal Grandfather    ??? Diabetes Paternal Grandmother        Review of Systems:  All other systems reviewed are negative.          Vital Signs last 24 hours:  Temp:  [37.6 ??C (99.7 ??F)-38.3 ??C (101 ??F)] 38.3 ??C (101 ??F)  Heart Rate:  [90-102] 90  SpO2 Pulse:  [90-104] 90  Resp:  [16-22] 22  BP: (116-150)/(71-93) 145/84  MAP (mmHg):  [86-110] 97  SpO2:  [96 %-98 %] 96 %    Physical Exam:  Patient Lines/Drains/Airways Status    Active Active Lines, Drains, & Airways     Name:   Placement date:   Placement time:   Site:   Days:    Peripheral IV 10/02/19 Left Antecubital   10/02/19    ???    Antecubital   less than 1    Arteriovenous Fistula - Vein Graft  Access Arteriovenous fistula Right;Upper Arm   ???    ???    Arm                 Const: lying in bed comfortably, not in acute distress  Eyes: sclerae anicteric and non injected  ENT: no dental carries, no oral thrust, no ulcer  Lymph: no cervical or supraclavicular LAD  CV: RRR, no abnormal heart sounds noted and no peripheral edema  Resp: normal work of breathing at rest and CTAB posteriorly  GI: soft, non distended, mild tender at LLQ, no rebound  GU: mild L flank and L CVA tender  Skin: no petechiae, ecchymoses or obvious rashes on clothed exam  MSK: no swollen joints  Neuro: no tremor noted, facial expression symmetric and moves extremities   Psych: calm, appropriate eye contact      Data for Medical Decision Making     Recent Labs   Lab Units 10/02/19  0706 10/02/19  0705 10/02/19  0006   WBC 10*9/L 11.8*  --  13.5*   HEMOGLOBIN g/dL 16.1*  --  09.6   PLATELET COUNT (1) 10*9/L 214  --  223   NEUTRO ABS 10*9/L 10.2*  --  11.7*   LYMPHO ABS 10*9/L 0.5*  --  0.5*   EOSINO ABS 10*9/L 0.1  --  0.1   BUN mg/dL  --  15 16   CREATININE mg/dL  --  0.45* 4.09*   AST U/L  --   --  27   ALT U/L  --   --  45   BILIRUBIN TOTAL mg/dL  --   --  1.2   ALK PHOS U/L  --   --  76   POTASSIUM mmol/L  --  4.6* 4.5   CALCIUM mg/dL  --  8.9 9.9           New Culture Data   none    Recent Studies   CT A/P 8/21  IMPRESSION:  -No acute pathology within the abdomen or pelvis.  -Sequelae of left lower quadrant renal transplant without hydronephrosis or perinephric fluid collection.  -Reported ovarian cyst is not visualized on this exam. Consider endovaginal ultrasound for further evaluation if clinically indicated.    Serologies:  Lab Results   Component Value Date    CMV IGG Positive (A) 05/02/2019    EBV VCA IgG Antibody Positive (A) 05/02/2019    Hep A IgG Nonreactive 03/26/2018    Hep B Surface Ag Nonreactive 05/02/2019    Hep B S Ab Reactive (A) 05/02/2019    Hep B Surf Ab Quant 209.45 (H) 05/02/2019    Hepatitis C Ab Nonreactive 05/02/2019    RPR Nonreactive 05/02/2019    HSV 1 IgG Positive (A) 05/02/2019    HSV 2 IgG Positive (A) 05/02/2019    Varicella IgG Positive 05/02/2019    Rubella IgG Scr Positive 03/26/2018    Toxoplasma Gondii IgG Negative 03/26/2018    Quantiferon TB Gold Plus Interpretation Negative 03/26/2018    Quantiferon Mitogen Minus Nil >10.00 03/26/2018 Quantiferon Antigen 1 minus Nil 0.00 03/26/2018       Immunizations:  Immunization History   Administered Date(s) Administered   ??? COVID-19 VACC,MRNA,(PFIZER)(PF)(IM) 04/29/2019, 08/06/2019   ??? HPV Quadrivalent (Gardasil) 05/28/2012   ??? Hepatitis B, Adult 12/10/2017   ??? Influenza Vaccine Quad (IIV4 PF) 21mo+ injectable 10/17/2015, 01/29/2019   ??? Influenza Virus Vaccine, unspecified formulation 11/12/2016, 01/31/2019   ??? PNEUMOCOCCAL POLYSACCHARIDE 23 12/20/2016   ??? TdaP 10/17/2015

## 2019-10-03 LAB — BASIC METABOLIC PANEL
ANION GAP: 6 mmol/L (ref 5–14)
BLOOD UREA NITROGEN: 16 mg/dL (ref 9–23)
BUN / CREAT RATIO: 12
CALCIUM: 9.7 mg/dL (ref 8.7–10.4)
CHLORIDE: 105 mmol/L (ref 98–107)
CO2: 25 mmol/L (ref 20.0–31.0)
CREATININE: 1.37 mg/dL — ABNORMAL HIGH
EGFR CKD-EPI AA FEMALE: 59 mL/min/{1.73_m2} — ABNORMAL LOW (ref >=60)
EGFR CKD-EPI NON-AA FEMALE: 51 mL/min/{1.73_m2} — ABNORMAL LOW (ref >=60)
GLUCOSE RANDOM: 96 mg/dL (ref 70–179)
POTASSIUM: 4.3 mmol/L (ref 3.4–4.5)
SODIUM: 136 mmol/L (ref 135–145)

## 2019-10-03 LAB — CBC
HEMATOCRIT: 37.4 % (ref 36.0–46.0)
HEMOGLOBIN: 12.5 g/dL (ref 12.0–16.0)
MEAN CORPUSCULAR HEMOGLOBIN CONC: 33.5 g/dL (ref 31.0–37.0)
MEAN CORPUSCULAR HEMOGLOBIN: 31 pg (ref 26.0–34.0)
MEAN CORPUSCULAR VOLUME: 92.5 fL (ref 80.0–100.0)
MEAN PLATELET VOLUME: 8.6 fL (ref 7.0–10.0)
PLATELET COUNT: 219 10*9/L (ref 150–440)
RED BLOOD CELL COUNT: 4.05 10*12/L (ref 4.00–5.20)
RED CELL DISTRIBUTION WIDTH: 13.4 % (ref 12.0–15.0)
WBC ADJUSTED: 9.3 10*9/L (ref 4.5–11.0)

## 2019-10-03 LAB — PHOSPHORUS: PHOSPHORUS: 2.5 mg/dL (ref 2.4–5.1)

## 2019-10-03 LAB — MAGNESIUM: MAGNESIUM: 1.5 mg/dL — ABNORMAL LOW (ref 1.6–2.6)

## 2019-10-03 LAB — TACROLIMUS LEVEL, TROUGH: TACROLIMUS, TROUGH: 6.8 ng/mL (ref 5.0–15.0)

## 2019-10-03 MED ORDER — LINEZOLID 600 MG TABLET
ORAL_TABLET | Freq: Two times a day (BID) | ORAL | 0 refills | 10.00000 days | Status: CP
Start: 2019-10-03 — End: 2019-10-13

## 2019-10-03 MED ORDER — TACROLIMUS XR 1 MG TABLET,EXTENDED RELEASE 24 HR
ORAL_TABLET | Freq: Every day | ORAL | 11 refills | 30 days | Status: CP
Start: 2019-10-03 — End: ?

## 2019-10-03 MED ADMIN — cholecalciferol (vitamin D3-10 mcg (400 unit)) tablet 10 mcg: 10 ug | ORAL | @ 14:00:00 | Stop: 2019-10-03

## 2019-10-03 MED ADMIN — fidaxomicin (DIFICID) tablet 200 mg: 200 mg | ORAL | @ 14:00:00 | Stop: 2019-10-03

## 2019-10-03 MED ADMIN — sodium bicarbonate tablet 1,300 mg: 1300 mg | ORAL | @ 14:00:00 | Stop: 2019-10-03

## 2019-10-03 MED ADMIN — aspirin chewable tablet 81 mg: 81 mg | ORAL | @ 14:00:00 | Stop: 2019-10-03

## 2019-10-03 MED ADMIN — mycophenolate (MYFORTIC) EC tablet 180 mg: 180 mg | ORAL | @ 01:00:00

## 2019-10-03 MED ADMIN — sodium bicarbonate tablet 1,300 mg: 1300 mg | ORAL | @ 04:00:00

## 2019-10-03 MED ADMIN — tacrolimus (ENVARSUS XR) extended release tablet 24 mg: 24 mg | ORAL | @ 14:00:00 | Stop: 2019-10-03

## 2019-10-03 NOTE — Unmapped (Signed)
Physician Discharge Summary [facility]  3 [facility]  [address]  [place] [address]  Dept: [phone]  Loc: [phone]     Identifying Information:   Elaine Koch  03-Jan-1986  [id number]    Primary Care Physician: Henderson Cloud, MD     Code Status: Full Code    Admit Date: 10/01/2019    Discharge Date: 10/03/2019     Discharge To: Home    Discharge Service: [facility] - Nephrology Floor Team (MEDB)     Discharge Attending Physician: Nelva Bush, MBBS    Discharge Diagnoses:   Principal Problem:    Fever POA: Unknown  Active Problems:    History of simultaneous kidney and pancreas transplant (CMS-HCC) POA: Not Applicable    Nausea & vomiting POA: Yes    Clostridium difficile infection POA: Yes  Resolved Problems:    * No resolved hospital problems. *      Hospital Course:   Elaine Koch is a 35 y.o. 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   ??  LLQ Pain, Fevers, Myalgias, N/V in Immunocompromised Host: Admitted on 8/21 with a five day history of bilateral lower pelvic cramps that then localized to LLQ, with associated headache, myalgias, and 1-2 days of fevers to 100-100.41F. In ED, febrile to 38.3C with TTP over LLQ in region of her allograft and had nausea with 1-2 episodes of vomiting, but HDS and mentating well. Denied SOB, vaginal discharge, diarrhea. Although UA showed 3 WBC, her UCx grew 50,000-100,000 CFU/mL of Enterococcus faecium. After consulting with Immunocompromised Infectious Disease, they thought this was unlikely to be transplant pyelonephritis. Otherwise, unclear source with negative imaging (CT C/A/P, TTE, Renal US all not c/f infection; Transvaginal US not c/f ovarian torsion, cyst, or ruptured cyst) and infectious disease workup (Flu/RSV/COVID, RPP, BCx at 24 hours) all negative.     We recommended staying 1-2 more days, but given patient's child care constraints, the patient requested to leave. We discussed the risks of discharging before finding a source of her fever, and she endorsed understanding and agreed to close follow-up with ICID. We discharged her in her hemodynamically stable state after ruling out the most life-threatening infections with plan for close outpatient follow-up. Per ICID recommendations we discharged patient with 10 days of Linezolid for urine culture weakly positive for enterococcus.    ??  Fevers - Myalgias - Headaches: following LLQ pain, has had low grade fevers, myalgias, and headaches. Syndrome could be consistent with viral etiology. No known sick contacts. Flu/RSV/COVID-19 negative. Blood and urine cultures obtained. CXR showed diffuse and retrocardiac opacities, will get CT Chest and serum viral studies. Holding antibiotics as above in setting of complicated C diff infection and no clear infectious source, unless crumps/fevers/concerning CT Chest.   ??  ESRD 2/2 T1DM s/p Kidney/Pancreas Transplant: Transplanted in March 2021. Renal function at baseline creatinine 1.5. Lipase normal, glucose normal indicating excellent graft function. Increased home envarsus from 22mg  to 24 mg daily, decreased myfortic from 360mg  BID to 180mg  BID. Continued prednisone 5mg  daily and bactrim 400-80 MWF.   ??  C Diff infection: positive 07/14/2019, course complicated by relapse on PO vanc taper. S/p bezlotoxumab 8/6, followed by ICID on fidoxamicin 200mg  daily with plan for fecal transplant by GI.     Outpatient Provider Follow Up Issues:   -  f/u BCx, UCx sensitivities, EBV, CMV, HSV, Enterovirus, HIV, VZV, Noro, Parvovirus, BK, and GIPP  -if continued LLQ pain or fevers, consider flex sig or other imaging of colon for colitis     Touchbase with Outpatient Provider:  Warm Handoff: Completed on 10/03/19 by Dr. Arlee Muslim (Attending) via In-Basket Message to Transplant Nephrologist    Procedures:  None  ______________________________________________________________________  Discharge Medications:     Medication List      START taking these medications    ??? linezolid 600 mg tablet; Commonly known as: ZYVOX; Take 1 tablet (600 mg   total) by mouth every twelve (12) hours for 10 days.     CHANGE how you take these medications    ??? * tacrolimus 4 mg Tb24 extended release tablet; Commonly known as:   ENVARSUS XR; Take 5 tablets (20 mg total) by mouth once daily with 2 (1   mg) tablets for total dose of 22 mg daily; What changed: Another   medication with the same name was changed. Make sure you understand how   and when to take each.  ??? * tacrolimus 1 mg Tb24 extended release tablet; Commonly known as:   ENVARSUS XR; Take 2 tablets (2 mg total) by mouth once daily. Take 2   tablets (4 mg) by mouth once daily with 5x 4 mg tablets (20 mg) for a   total dose of 24 mg daily; What changed: additional instructions  * This list has 2 medication(s) that are the same as other medications   prescribed for you. Read the directions carefully, and ask your doctor or   other care provider to review them with you.     CONTINUE taking these medications    ??? acetaminophen 500 MG tablet; Commonly known as: TYLENOL; Take 1-2   tablets (500-1,000 mg total) by mouth every six (6) hours as needed for   pain or fever greater than 100.67F (38C).  ??? aspirin 81 MG tablet; Commonly known as: ECOTRIN; Take 1 tablet (81 mg   total) by mouth daily.  ??? biotin 5 mg tablet; Take 1 tablet (5 mg total) by mouth daily.  ??? cholecalciferol (vitamin D3-125 mcg (5,000 unit)) 125 mcg (5,000 unit)   capsule; Take 1 capsule (125 mcg total) by mouth daily.  ??? fidaxomicin 200 mg tablet; Commonly known as: DIFICID; Take 1 tablet   (200 mg total) by mouth every morning before breakfast. C-diff  ??? multivitamin, prenatal (folic acid-iron) 27-1 mg Tab  ??? mycophenolate 180 MG EC tablet; Commonly known as: MYFORTIC; Take 2   tablets (360 mg total) by mouth Two (2) times a day.  ??? predniSONE 5 MG tablet; Commonly known as: DELTASONE; Take 1 tablet (5   mg total) by mouth daily.  ??? sodium bicarbonate 650 mg tablet; Take 2 tablets (1,300 mg total) by   mouth Three (3) times a day.  ??? sulfamethoxazole-trimethoprim 400-80 mg per tablet; Commonly known as:   BACTRIM; Take 1 tablet (80 mg of trimethoprim total) by mouth Every   Monday, Wednesday, and Friday.  ??? vancomycin 125 MG capsule; Commonly known as: VANCOCIN; 4x/d for 14d,   2x/d for 7d, 1x/d for 7d, every 2d x 7d, every 3d x 14d       Allergies:  Iodinated contrast media, Nickel, Propranolol, Eye irrigating solution [ophthalmic irrigation solution], Iodine, Naltrexone, and Uni-cortrom  ______________________________________________________________________  Pending Test Results:  Pending Labs     Order Current Status    BK Virus, DNA, Quantitative,  Blood In process    CMV DNA, quantitative, PCR In process    Enterovirus PCR, Blood In process    HIV Antigen/Antibody Combo In process    HSV 1 AND 2 BY PCR, BLOOD In process    HSV PCR In process    Parvovirus PCR, Blood In process    Varicella Zoster Virus (VZV) PCR, Blood In process    Blood Culture Preliminary result    Blood Culture Preliminary result    Urine Culture Preliminary result          Most Recent Labs:  All lab results last 24 hours -   Recent Results (from the past 24 hour(s))   CBC    Collection Time: 10/03/19  5:54 AM   Result Value Ref Range    WBC 9.3 4.5 - 11.0 10*9/L    RBC 4.05 4.00 - 5.20 10*12/L    HGB 12.5 12.0 - 16.0 g/dL    HCT 16.1 09.6 - 04.5 %    MCV 92.5 80.0 - 100.0 fL    MCH 31.0 26.0 - 34.0 pg    MCHC 33.5 31.0 - 37.0 g/dL    RDW 40.9 81.1 - 91.4 %    MPV 8.6 7.0 - 10.0 fL    Platelet 219 150 - 440 10*9/L   Basic metabolic panel    Collection Time: 10/03/19  5:54 AM   Result Value Ref Range    Sodium 136 135 - 145 mmol/L    Potassium 4.3 3.4 - 4.5 mmol/L    Chloride 105 98 - 107 mmol/L    CO2 25.0 20.0 - 31.0 mmol/L Anion Gap 6 5 - 14 mmol/L    BUN 16 9 - 23 mg/dL    Creatinine 7.82 (H) 0.60 - 0.80 mg/dL    BUN/Creatinine Ratio 12     EGFR CKD-EPI Non-African American, Female 51 (L) >=60 mL/min/1.100m2    EGFR CKD-EPI African American, Female 59 (L) >=60 mL/min/1.106m2    Glucose 96 70 - 179 mg/dL    Calcium 9.7 8.7 - 95.6 mg/dL   Magnesium Level    Collection Time: 10/03/19  5:54 AM   Result Value Ref Range    Magnesium 1.5 (L) 1.6 - 2.6 mg/dL   Phosphorus Level    Collection Time: 10/03/19  5:54 AM   Result Value Ref Range    Phosphorus 2.5 2.4 - 5.1 mg/dL   Tacrolimus Level, Trough    Collection Time: 10/03/19  5:54 AM   Result Value Ref Range    Tacrolimus, Trough 6.8 5.0 - 15.0 ng/mL       Relevant Studies/Radiology:  ECG 12 Lead    Result Date: 10/02/2019  NORMAL SINUS RHYTHM POSSIBLE LEFT ATRIAL ENLARGEMENT BORDERLINE ECG WHEN COMPARED WITH ECG OF 13-Jul-2019 20:06, NO SIGNIFICANT CHANGE WAS FOUND Confirmed by Manuella Ghazi [id number]) on 10/02/2019 10:11:53 AM    CT Abdomen Pelvis Wo Contrast    Result Date: 10/02/2019  EXAM: CT ABDOMEN PELVIS WO CONTRAST DATE: 10/02/2019 10:00 AM ACCESSION: [id number] UN DICTATED: 10/02/2019 10:12 AM INTERPRETATION LOCATION: [facility] CLINICAL INDICATION: LLQ abd pain, hx of L renal transplant, ovarian cyst, refractory c.diff c/f infection vs rupture  COMPARISON: CT abdomen pelvis 02/06/2017, renal ultrasound 10/02/2019. TECHNIQUE: A spiral CT scan of the abdomen and pelvis was obtained without IV contrast from the lung bases through the pubic symphysis. Images were reconstructed in the axial plane. Coronal and sagittal reformatted images were also provided for further evaluation. FINDINGS: Evaluation of the solid organs  and vasculature is limited in the absence of intravenous contrast. LINES AND TUBES: None. LOWER THORAX: Trace right pleural effusion with adjacent atelectasis. Atelectasis in the left lower lobe. The blood pool is hypoattenuating to the adjacent myocardium, suggest an element of anemia. There is no significant pericardial effusion. HEPATOBILIARY: There is left hepatic lobe hypertrophy. No focal hepatic lesions. The gallbladder is present and otherwise unremarkable. No biliary dilatation.  SPLEEN: Unremarkable. PANCREAS: Unremarkable. ADRENALS: Unremarkable. KIDNEYS/URETERS: Bilateral native kidneys are mildly atrophic. No hydronephrosis. Left lower quadrant transplant kidney is grossly unremarkable. BLADDER: Unremarkable. PELVIC/REPRODUCTIVE ORGANS: Uterus is unremarkable. GI TRACT: There is a probable tiny hiatal hernia. No evidence of bowel obstruction. Surgical changes are present in the right lower abdominal quadrant. PERITONEUM/RETROPERITONEUM AND MESENTERY: No free air or fluid. Surgical clips scattered throughout the pelvis. VESSELS: The abdominal aorta is normal in caliber. BONES AND SOFT TISSUES: Postsurgical changes along the anterior abdominal wall. No acute osseous abnormality is detected.     -No acute pathology within the abdomen or pelvis. -Sequelae of left lower quadrant renal transplant without hydronephrosis or perinephric fluid collection.     XR Chest Portable    Result Date: 10/02/2019  EXAM: XR CHEST PORTABLE DATE: 10/02/2019 ACCESSION: [id number] UN DICTATED: 10/02/2019 8:15 AM INTERPRETATION LOCATION: [facility] CLINICAL INDICATION: 34 years old Female with FEVER  COMPARISON: Chest radiograph 07/13/2019 TECHNIQUE: Portable Chest Radiograph. FINDINGS: Streaky linear bibasilar and patchy left retrocardiac opacities. No pleural effusion or pneumothorax Stable cardiomediastinal silhouette.     Streaky linear bibasilar opacities and patchy left retrocardiac opacity, which reflect atelectasis and/or infection.    CT Chest Wo Contrast    Result Date: 10/02/2019  EXAM: CT CHEST WO CONTRAST DATE: 10/02/2019 6:47 PM ACCESSION: [id number] UN DICTATED: 10/02/2019 6:49 PM INTERPRETATION LOCATION: [facility] CLINICAL INDICATION: 34 years old Female with retrocardiac opacity c/f infection  COMPARISON: Chest radiograph 10/02/2019 TECHNIQUE: A helical CT scan was obtained without IV contrast from the thoracic inlet through the hemidiaphragms. Images were reconstructed in the axial plane.  Coronal and sagittal reformatted images of the chest were also provided for further evaluation of the lung parenchyma. FINDINGS: AIRWAYS, LUNGS, PLEURA: Clear central airways. Trace bilateral pleural effusions. Minimal bibasilar atelectasis. MEDIASTINUM: Left ventricle qualitatively mildly dilated. Few calcifications of the coronary arteries. Trace pericardial effusion. Normal caliber thoracic aorta.  No mediastinal lymphadenopathy. IMAGED ABDOMEN: Unremarkable. SOFT TISSUES: Unremarkable. BONES: Unremarkable.     No pneumonia.     US Renal Transplant W Doppler    Result Date: 10/02/2019  EXAM: US RENAL TRANSPLANT W DOPPLER DATE: 10/02/2019 2:29 AM ACCESSION: [id number] UN DICTATED: 10/02/2019 2:30 AM INTERPRETATION LOCATION: [facility] CLINICAL INDICATION: 34 year old Female with LLQ abdominal pain over kidney txp site COMPARISON: Renal transplant Doppler 07/13/2019. TECHNIQUE:  Ultrasound views of the renal transplant were obtained using gray scale and color and spectral Doppler imaging. Views of the native kidneys and urinary bladder were obtained using gray scale and limited color Doppler imaging. FINDINGS: TRANSPLANTED KIDNEY: The renal transplant located in the left lower quadrant. Echogenicity is within normal limits.  No solid masses or calculi. No perinephric collections identified. No hydronephrosis. VESSELS: - Perfusion: Using power Doppler, normal perfusion was seen throughout the renal parenchyma. - Resistive indices in the renal transplant are stable compared with prior examination. - Main renal artery/iliac artery: Patent - Main renal vein/iliac vein: Patent BLADDER: Distended with anechoic fluid and demonstrates no acute abnormality.     No significant change compared with prior study. Stable resistive indices in the  renal transplant arteries, within normal limits. Mildly distended bladder. Please see below for data measurements: Transplant location: LLQ Renal Transplant: length 11.6 cm; width 5.2 cm; height 5.3 cm Arcuate artery superior resistive index: .63 Arcuate artery mid resistive index: .65 Arcuate artery inferior resistive index: .61 Previous resistive indices range of arcuate arteries: .70-.76 Segmental artery superior resistive index: .70 Segmental artery mid resistive index: .67 Segmental artery inferior resistive index: .71 Previous resistive indices range of segmental arteries: .69-.74 Main renal artery hilum resistive index: .73 Main renal artery mid resistive index: .81 Main renal artery anastomosis resistive index: .75 Previous resistive indices range of main renal artery: .65-.84 Main renal vein: patent Iliac artery: Patent Iliac vein: Patent Bladder volume prevoid: 294.4  mL     [place] Endovaginal (Non-OB)    Result Date: 10/03/2019  EXAM: [place] ENDOVAGINAL (NON-OB) DATE: 10/02/2019 6:30 PM ACCESSION: [id number] UN DICTATED: 10/02/2019 6:41 PM INTERPRETATION LOCATION: [facility] CLINICAL INDICATION: 35 years old Female with concern for ovarian cyst/infection  TECHNIQUE: Ultrasound views of the pelvis were obtained endovaginally using gray scale and color Doppler imaging. Spectral Doppler imaging was also performed. COMPARISON: CT abdomen/pelvis 10/02/2019 FINDINGS: UTERUS/CERVIX: The uterus is anteverted. No focal myometrial mass was seen.  The endometrium was normal in thickness. OVARIES: The ovaries were seen well transvaginally. Small cystic areas were seen within both ovaries compatible with follicles. Appropriate arterial inflow and venous outflow of the ovaries was documented on color and spectral Doppler imaging. OTHER: No abnormal pelvic free fluid.     1. No evidence of ovarian torsion at time of exam. 2. Overall unremarkable pelvic ultrasound. Spectral doppler was performed on bilateral ovaries. Additional transabdominal images were not obtained. Please see below for data measurements: LMP: Mar 2021 Uterus: length 8.7 cm; width 4.8 cm; height 4.1 cm Endometrium: 0.50 cm Right ovary: length 4.3 cm; transverse 3.3 cm; AP 2.1 cm Left ovary: length 3.7 cm; transverse 3.2 cm; AP 3.1 cm    Echocardiogram W Colorflow Spectral Doppler    Result Date: 10/03/2019  Patient Info Name:     SIRI BUEGE Carter Age:     34 years DOB:     1986-01-13 Gender:     Female MRN:     [id number] Accession #:     [id number] UN Ht:     165 cm Wt:     80 kg BSA:     1.94 m2 HR:     88 bpm BP:     156 /     86 mmHg Heart Rhythm:     Sinus Rhythm Technical Quality:     Good Exam Date:     10/03/2019 10:39 AM Site Location:     [facility] Exam Location:     [facility] Admit Date:     10/01/2019 Exam Type:     ECHOCARDIOGRAM W COLORFLOW SPECTRAL DOPPLER Study Info Indications      - New HF vs endocarditis Complete two-dimensional, color flow and Doppler transthoracic echocardiogram is performed. Staff Referring Physician:     SELF, REFERRED  ; Sonographer:     Aida Raider, RDCS Ordering Physician:     Arlean Hopping Account #:     [id number] Summary   1. The left ventricle is normal in size with upper normal wall thickness.   2. The left ventricular systolic function is normal, LVEF is visually estimated at > 55%.   3. The left atrium is mildly dilated in size.   4. The right ventricle is normal in size, with  normal systolic function.   5. There are no significant valvular abnormalities.   6. There are no apparent valvular vegetations. Left Ventricle   The left ventricle is normal in size with upper normal wall thickness.   The left ventricular systolic function is normal, LVEF is visually estimated at > 55%.   The left ventricular ejection fraction was quantified (3D) at 58 %.   There is normal left ventricular diastolic function.   LV global longitudinal strain: 20.4 %. Right Ventricle   The right ventricle is normal in size, with normal systolic function. Left Atrium   The left atrium is mildly dilated in size. Right Atrium   The right atrium is upper normal  in size. Aortic Valve   The aortic valve is trileaflet with normal appearing leaflets with normal excursion.   There is no significant aortic regurgitation.   There is no evidence of a significant transvalvular gradient. Pulmonic Valve   The pulmonic valve is normal.   There is no significant pulmonic regurgitation.   There is no evidence of a significant transvalvular gradient. Mitral Valve   The mitral valve leaflets are normal with normal leaflet mobility.   There is trivial mitral valve regurgitation. Tricuspid Valve   The tricuspid valve leaflets are normal, with normal leaflet mobility.   There is trivial tricuspid regurgitation.   Pulmonary systolic pressure cannot be estimated due to insufficient TR jet. Other Findings   There are no apparent valvular vegetations. Pericardium/Pleural   There is a trivial pericardial effusion. Inferior Vena Cava   IVC size and inspiratory change suggest normal right atrial pressure. (0-5 mmHg). Aorta   The aorta is normal in size in the visualized segments. Pulmonic Valve ---------------------------------------------------------------------- Name                                 Value        Normal ---------------------------------------------------------------------- PV Doppler ---------------------------------------------------------------------- PV Peak Velocity                   1.1 m/s Mitral Valve ---------------------------------------------------------------------- Name                                 Value        Normal ---------------------------------------------------------------------- MV Diastolic Function ---------------------------------------------------------------------- MV E Peak Velocity                 83 cm/s               MV A Peak Velocity                 97 cm/s               MV E/A 0.9               MV Annular TDI ---------------------------------------------------------------------- MV Septal e' Velocity             6.6 cm/s         >=8.0 MV Lateral e' Velocity            7.1 cm/s        >=10.0 MV e' Average                          6.9  MV E/e' (Average)                     12.1 Tricuspid Valve ---------------------------------------------------------------------- Name                                 Value        Normal ---------------------------------------------------------------------- Estimated PAP/RSVP ---------------------------------------------------------------------- RA Pressure                         3 mmHg           <=5 Aorta ---------------------------------------------------------------------- Name                                 Value        Normal ---------------------------------------------------------------------- Ascending Aorta ---------------------------------------------------------------------- Ao Root Diameter (2D)               2.9 cm               Ao Root Diam Index (2D)         15.0 cm/m2 Venous ---------------------------------------------------------------------- Name                                 Value        Normal ---------------------------------------------------------------------- IVC/SVC ---------------------------------------------------------------------- IVC Diameter (Exp 2D)               1.3 cm         <=2.1 Aortic Valve ---------------------------------------------------------------------- Name                                 Value        Normal ---------------------------------------------------------------------- AV Doppler ---------------------------------------------------------------------- AV Peak Velocity                   1.8 m/s Ventricles ---------------------------------------------------------------------- Name                                 Value        Normal ---------------------------------------------------------------------- LV Dimensions 2D/MM ---------------------------------------------------------------------- IVS Diastolic Thickness (2D)        1.1 cm       0.6-0.9 LVID Diastole (2D)                  4.7 cm       3.8-5.2 LVPW Diastolic Thickness (2D)                                1.0 cm       0.6-0.9 LVID Systole (2D)                   2.7 cm       2.2-3.5 LV Function ---------------------------------------------------------------------- Global Longitudinal Strain          20.4 %               RV Dimensions 2D/MM ---------------------------------------------------------------------- RV Basal Diastolic Dimension        3.5 cm       2.5-4.1 TAPSE  2.8 cm         >=1.7 Atria ---------------------------------------------------------------------- Name                                 Value        Normal ---------------------------------------------------------------------- LA Dimensions ---------------------------------------------------------------------- LA Dimension (2D)                   4.6 cm       2.7-3.8 LA Volume (BP MOD)                   52 ml               LA Volume Index (BP MOD)       26.85 ml/m2   16.00-34.00 RA Dimensions ---------------------------------------------------------------------- RA Area (4C)                      13.5 cm2        <=18.0 RA Area (4C) Index              7.0 cm2/m2 QLAB ---------------------------------------------------------------------- Name                                 Value        Normal ---------------------------------------------------------------------- Heart Model ---------------------------------------------------------------------- EDV HM                              129 ml               ESV HM                               54 ml               LV Length ED HM                     8.9 cm               LV Length ES HM                     7.0 cm               LV EF HM 58 %               LV SV HM                           75.0 ml               LV Mass HM                           146 g               LV CI HM                       3.31 l/min/m2               AutoStrain ---------------------------------------------------------------------- LV A2C Peak Strain                 -20.1 %  LV A3C Peak Strain                 -20.4 %               LV A4C Peak Strain                 -20.8 %               LV Peak GLS                        -20.4 % Report Signatures Finalized by Andrey Campanile  MD on 10/03/2019 11:58 AM    ______________________________________________________________________  Discharge Instructions:     You were admitted to [facility] with a fever, left lower abdominal pain near your transplanted kidney, muscle aches, nausea, and vomiting. You were treated with IV fluids. Although our workup is incomplete, we believe that you are safe to discharge. However, there are significant risks to people with immunocompromised immune systems with fevers with an unknown source. Our Immunocompromised Infectious Disease specialists will follow your lab results and contact you if any new results of concern come in.     IF YOU EXPERIENCE ANY NEW OR CONCERNING SYMPTOMS, LIKE HIGH FEVERS (greater than 102.8 F), CHEST PAIN, SHORTNESS OF BREATH, NAUSEA, VOMITING, DIARRHEA, LOSS OF TASTE OR SMELL, OR ANYTHING ELSE THAT MAKES YOU WORRIED, PLEASE SEEK CARE.     MEDICATION CHANGES:  - We increased your tacrolimus dose from 22mg  daily to 24mg  daily, to keep your tacrolimus levels within goal    FOLLOW-UP PLAN:  - Please call to schedule an appointment to follow-up with your primary care doctor within the next 7-10 days, and Immunocompromised Infectious Disease in 2 days  - You are scheduled to follow-up with GI on 10/15/2019, and Nephrology (with Dr. Carlene Coria) on 11/19/2019.   - IF YOU DO NOT FOLLOW-UP WITH IMMUNOCOMPROMISED INFECTIOUS DISEASE WITHIN TWO DAYS, PLEASE CALL THEM AT [phone]    If you have any concerns before you are able to follow-up with your primary care doctor, you can reach [place] by calling [phone] and asking to page the Nephrology (or Med B) resident on call.    Appointments which have been scheduled for you    Oct 15, 2019  9:20 AM  (Arrive by 9:05 AM)  NEW C DIFF with Carmon Ginsberg, MD  [facility] GI MEDICINE [place] [facility]) [address]  [place] [address]  [phone]      Nov 19, 2019  2:00 PM  (Arrive by 1:45 PM)  RETURN NEPHROLOGY POST with Posey Boyer True, MD  [facility] KIDNEY TRANSPLANT [place]  [facility] REGION) [address]  [place] [address]  [phone]           ______________________________________________________________________  Discharge Day Services:  BP 129/72  - Pulse 91  - Temp 36.9 ??C (Axillary)  - Resp 16  - Ht 165.1 cm (5' 5)  - Wt 79.8 kg (176 lb)  - LMP 04/19/2019 (Within Weeks)  - SpO2 99%  - Breastfeeding No  - BMI 29.29 kg/m??     Pt seen on the day of discharge and determined appropriate for discharge.    Condition at Discharge: good    Length of Discharge: I spent greater than 30 mins in the discharge of this patient.    Karie Schwalbe, MS4    ???I attest that I have  reviewed the student note and that the components of the history of the present illness, the physical exam, and the assessment and plan documented were performed by me or were performed in my presence by the student where I verified the documentation and performed (or re-performed) the exam and medical decision making.???    Harlow Mares, MD  PGY-2

## 2019-10-03 NOTE — Unmapped (Signed)
IMMUNOCOMPROMISED HOST INFECTIOUS DISEASE PROGRESS NOTE      Assessment/Plan:     Elaine Koch is a 34 y.o. 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  Pertinent Co-morbidities  #T1DM  - diagnosed at age 19, c/b nephropathy and retinopathy  -??08/06/2019??A1C 4.8??  ??  #Sickle cell trait  ??  # Mild post-transplant CKD  Estimated Creatinine Clearance: 56.7 mL/min (A) (based on SCr of 1.49 mg/dL (H)).  ??  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  # Fever, L flank pain, LUQ pain c/f possible E.faecalis UTI, 10/01/19  -8/20 fever T 38.3 but no dysuria, no suprapubic pain. Tender on LLQ over allograft  -8/21 UA wbc 3  -8/21 UCx 50k-100k E.faecalis  -8/21 CT A/P no acute pathology within abdomen/pelvis  -8/21 CXR Streaky linear bibasilar opacities and patchy left retrocardiac opacity  -8/21 CT chest no PNA  -8/21 TransVg [place] No evidence of ovarian torsion, no abnormal pelvic free fluid.  ??     RECOMMENDATIONS FOR 10/03/2019    Diagnostic  ??? F/u RPR, serum adenovirus, HSV, VZV, enterovirus, norovirus PCR  ?? F/u BK virus, CMV    Treatment  ?? Start linezolid 600mg  BID PO x7days (8/22-8/28) for presumed E.faecalis UTI (empirically treat)  ?? Continue fidaxomicin 200mg  daily for recurrent C.diff suppression    Solid Organ Transplant Infectious Diseases Follow-up Instructions  - Appointment: 2 weeks  - Location: [facility]th [facility], [address] [place], [place]  - Labs: none  - Please fax labs to patient???s transplant coordinator: Baltazar Najjar at  [phone] (Kidney/Pancreas)  - Antibiotics:   - linezolid 600mg  BID PO x7days (8/22-8/28)  - fidaxomicin 200mg  daily for recurrent C.diff suppression        The [facility] ID service will sign off.  Please page the ID Transplant/Liquid Oncology Fellow consult at [phone] with questions.  Patient discussed with Dr. Kari Baars    Theppharit Cherylynn Ridges, MD  Fellow, [facility] Infectious Diseases    _______________________________________________________________________    Attending attestation  I saw and evaluated the patient. I discussed and agree with the findings and the plan of care as documented in the fellow???s note.The patient is at risk of decompensation from infection due to underlying immunocompromise and/or mucosal barrier defects. The risks and benefits of initiating and/or continuing potentially toxic anti-infective therapies and diagnostic procedures were discussed with the care teams.     I personally reviewed updated microbiological culture and susceptibility data.     I personally reviewed updated relevant radiological studies.    Elaine Koch has no pyuria and her fever and abdominal pain have resolved without antibiotics. I am hesitant to start antibiotics on her given C.diff, however, after our discussion she said that her symptoms now feel like a UTI and she would rather treat it now than wait. Given that she is reporting symptoms, I felt that it was reasonable to provide a short course of linezolid which should not have the same impact on C.diff risk as  commonly used GN antibiotics.     [name], MD  Immunocompromised ID  Pager [phone]  _______________________________________________________________________    Subjective:     Interval History:    History obtained from:patient.  Afebrile. Reports feeling better. Denies cough, shortness of breath, diarrhea. Reports interval improvement of LLQ pain. Medications:  Antimicrobials:  Anti-infectives (From admission, onward)    Start     Dose/Rate Route Frequency Ordered Stop    10/04/19 0900  sulfamethoxazole-trimethoprim (BACTRIM) 400-80 mg tablet 80 mg of trimethoprim      1 tablet Oral Every Mon-Wed-Fri 10/02/19 0707 11/03/19 0859    10/02/19 1053  fidaxomicin (DIFICID) tablet 200 mg      200 mg Oral Daily (standard) 10/02/19 0707 11/01/19 0859          Prior/Current immunomodulators:  Tac, MMF    Other medications reviewed.    Objective:     Vital Signs last 24 hours:  Temp:  [36.7 ??C (98 ??F)-37.6 ??C (99.7 ??F)] 37.1 ??C (98.8 ??F)  Heart Rate:  [80-101] 93  SpO2 Pulse:  [80-96] 96  Resp:  [14-25] 18  BP: (116-156)/(68-90) 156/86  MAP (mmHg):  [84-109] 109  SpO2:  [95 %-99 %] 99 %    Physical Exam:  Patient Lines/Drains/Airways Status    Active Active Lines, Drains, & Airways     Name:   Placement date:   Placement time:   Site:   Days:    Peripheral IV 10/02/19 Left Antecubital   10/02/19    ???    Antecubital   1    Arteriovenous Fistula - Vein Graft  Access Arteriovenous fistula Right;Upper Arm   ???    ???    Arm                 Const:??lying in bed comfortably,??not in acute distress  Eyes:??sclerae anicteric and non injected  ENT: no dental carries, no oral thrust, no ulcer  Lymph: no cervical or supraclavicular LAD  CV: RRR, no abnormal heart sounds noted and no peripheral edema  Resp: normal work of breathing at rest and CTAB posteriorly  GI: soft, non distended, mild tender at LLQ, no rebound  GU: mild L flank   Skin: no petechiae, ecchymoses or obvious rashes on clothed exam  MSK: no swollen joints  Neuro: no tremor noted, facial expression symmetric and moves extremities   Psych: calm, appropriate eye contact    Data for Medical Decision Making     Recent Labs   Lab Units 10/03/19  0554 10/02/19  0706 10/02/19  0705 10/02/19  0006   WBC 10*9/L 9.3 11.8*  --  13.5*   HEMOGLOBIN g/dL 09.8 11.9*  --  14.7   PLATELET COUNT (1) 10*9/L 219 214  --  223   NEUTRO ABS 10*9/L  --  10.2*  --  11.7*   LYMPHO ABS 10*9/L  --  0.5*  --  0.5*   EOSINO ABS 10*9/L  --  0.1  --  0.1   BUN mg/dL 16  --  15 16   CREATININE mg/dL 8.29*  --  5.62* 1.30*   AST U/L  --   --   --  27   ALT U/L  --   --   --  45   BILIRUBIN TOTAL mg/dL  --   --   --  1.2   ALK PHOS U/L  --   --   --  76   POTASSIUM mmol/L 4.3  --  4.6* 4.5   MAGNESIUM mg/dL 1.5*  --   --   --    PHOSPHORUS mg/dL 2.5  --   --   --    CALCIUM mg/dL 9.7  --  8.9 9.9         New Micro Data   none    Recent Studies  CT chest 8/21  IMPRESSION:  ??  No pneumonia.    TVG US 8/21  IMPRESSION:  1. No evidence of ovarian torsion at time of exam.  2. Overall unremarkable pelvic ultrasound.    CT A/P 8/21  ??  IMPRESSION:  -No acute pathology within the abdomen or pelvis.  -Sequelae of left lower quadrant renal transplant without hydronephrosis or perinephric fluid collection.

## 2019-10-03 NOTE — Unmapped (Shared)
Nephrology (MEDB) Progress Note    Assessment & Plan:   Elaine Koch is a 34 y.o. 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    Principal Problem:    Fever  Active Problems:    History of simultaneous kidney and pancreas transplant (CMS-HCC)    Nausea & vomiting    Clostridium difficile infection  Resolved Problems:    * No resolved hospital problems. *    LLQ Pain c/f Transplant Pyelo vs Colitis: TTP over LLQ in region of her allograft with UCx positive for E faecium (50-100k, UA showed 3 WBC) c/f transplant pyelo. CT A/P w/o contrast and Renal US unremarkable. Transvaginal US not c/f ovarian torsion or ruptured cyst. Only other consideration would be colitis, GIPP/CMV pending. Initially held off antibiotics in setting of fidaxomicin suppression with potential need for fecal transplant.    -consulted ICID, appreciate recs  -f/u BCx, GIPP, CMV  -if crumps or repeatedly fevers, start anti-E faecium antibiotics  ??  Fevers - Myalgias - Headaches:??following LLQ pain, has had low grade fevers, myalgias, and headaches c/f viral infection (no sick contacts; Flu/RSV/COVID, RPP, EBV negative) vs transplant pyelo infectious symptoms. CXR showed diffuse and retrocardiac opacities, but CT Chest not c/f infection. TTE consistent with pre-transplant record. Pending ICID recs for antibiotics for possible transplant pyelo.    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 Renal function at baseline creatinine 1.5. Lipase normal, glucose normal indicating excellent graft function.   -increase home envarsus from 22mg  to 24mg  daily   -check daily tac troughs  -decrease myfortic from 360mg  to 180mg  BID  -continue prednisone 5mg  daily   -continue bactrim prophylaxis for pneumocystis M/W/F    C Diff infection:??positive 07/14/2019. She has had a complicated course including relapse on PO vanc taper. She is followed by immunocompromised ID. S/p bezlotoxumab 8/6 and continues on fidaxomicin 200mg  daily.   -continue fidaxomicin??     Daily Checklist:  Diet: Regular Diet  DVT PPx: Not Indicated - Padua Score <4  Electrolytes: No Repletion Needed  Code Status: Full Code    Team Contact Information:   Primary Team: Nephrology (MEDB)  Primary Resident: Karie Schwalbe, MD  Resident's Pager: [phone] (Nephrology Intern - Blue)    Interval History:   No acute events overnight. Still TTP in LLQ over transplanted kidney, but improved headache. No diarrhea, vaginal discharge, CP/SOB, N/V. Urine culture positive for E faecium, discussing possibly of transplant pyelo with ICID.     All other systems were reviewed and are negative except as noted in the HPI    Objective:   Temp:  [36.7 ??C (98 ??F)-37.1 ??C (98.8 ??F)] 36.9 ??C (98.4 ??F)  Heart Rate:  [80-101] 91  SpO2 Pulse:  [80-96] 96  Resp:  [14-25] 16  BP: (116-156)/(68-90) 129/72  SpO2:  [95 %-99 %] 99 %    Gen: WDWN woman lying in bed in NAD, answers questions appropriately  Eyes: sclera anicteric, EOMI  HENT: atraumatic, MMM   Heart: RRR, S1, S2, referred bruit from RUE fistula, no R/G  Lungs: CTAB, no crackles or wheezes, no use of accessory muscles  Abdomen: Normoactive bowel sounds, soft, TTP over LLQ to mild and deep palpation, no rebound/guarding  Extremities: no clubbing, cyanosis, or edema in the BLEs  Psych: Alert, oriented, appropriate mood and affect    Labs/Studies: Labs and Studies from the last 24hrs per EMR and Reviewed

## 2019-10-04 DIAGNOSIS — Z940 Kidney transplant status: Principal | ICD-10-CM

## 2019-10-04 DIAGNOSIS — Z9483 Pancreas transplant status: Principal | ICD-10-CM

## 2019-10-04 DIAGNOSIS — D849 Immunodeficiency, unspecified: Principal | ICD-10-CM

## 2019-10-04 LAB — REFERRAL LABORATORY TEST, OTHER

## 2019-10-04 LAB — PARVOVIRUS PCR, BLOOD: PARVO PCR, BLD: NEGATIVE

## 2019-10-04 LAB — HIV ANTIGEN/ANTIBODY COMBO: HIV ANTIGEN/ANTIBODY COMBO: NONREACTIVE

## 2019-10-04 LAB — VARICELLA ZOSTER PCR, BLOOD: VZV PCR, BLOOD: NEGATIVE

## 2019-10-04 NOTE — Unmapped (Signed)
Patient Aox4, c/o LLQ pain improved from yesterday, pain medication and non-pharmacological interventions offered and refused. Ambulated independently to bathroom, independent in ADLs. Meals and snacks offered and refused. Continuous pulse ox in place. Echocardiogram completed in AM. Anxious for discharge.      Problem: Adult Inpatient Plan of Care  Goal: Plan of Care Review  Outcome: Progressing  Goal: Patient-Specific Goal (Individualized)  Outcome: Progressing  Goal: Absence of Hospital-Acquired Illness or Injury  Outcome: Progressing  Intervention: Identify and Manage Fall Risk  Recent Flowsheet Documentation  Taken 10/03/2019 0800 by Maris Berger, RN  Safety Interventions: low bed  Intervention: Prevent Infection  Recent Flowsheet Documentation  Taken 10/03/2019 0800 by Maris Berger, RN  Infection Prevention:  ??? cohorting utilized  ??? hand hygiene promoted  ??? personal protective equipment utilized  ??? rest/sleep promoted  Goal: Optimal Comfort and Wellbeing  Outcome: Progressing  Goal: Readiness for Transition of Care  Outcome: Progressing  Goal: Rounds/Family Conference  Outcome: Progressing

## 2019-10-04 NOTE — Unmapped (Signed)
Discharge summary discussed in detail with patient, including starting new ABT for UTI and change in dosage of tacrolimus. Patient states understanding, no further questions at this time. PIV discontinued, transport ordered.

## 2019-10-05 DIAGNOSIS — Z9483 Pancreas transplant status: Principal | ICD-10-CM

## 2019-10-05 DIAGNOSIS — Z940 Kidney transplant status: Principal | ICD-10-CM

## 2019-10-05 LAB — CMV DNA, QUANTITATIVE, PCR
CMV QUANT LOG10: 1.85 {Log_IU}/mL — ABNORMAL HIGH (ref <0.00)
CMV QUANT: 70 [IU]/mL — ABNORMAL HIGH (ref <0)

## 2019-10-06 LAB — BK VIRUS QUANTITATIVE PCR, BLOOD: BK BLOOD RESULT: NOT DETECTED

## 2019-10-06 LAB — REFERRAL LABORATORY TEST, OTHER

## 2019-10-07 DIAGNOSIS — Z9483 Pancreas transplant status: Principal | ICD-10-CM

## 2019-10-07 DIAGNOSIS — Z940 Kidney transplant status: Principal | ICD-10-CM

## 2019-10-09 LAB — CBC W/ DIFFERENTIAL
BANDED NEUTROPHILS ABSOLUTE COUNT: 0 10*3/uL (ref 0.0–0.1)
BASOPHILS ABSOLUTE COUNT: 0 10*3/uL (ref 0.0–0.2)
BASOPHILS RELATIVE PERCENT: 1 %
EOSINOPHILS ABSOLUTE COUNT: 0.1 10*3/uL (ref 0.0–0.4)
EOSINOPHILS RELATIVE PERCENT: 2 %
HEMATOCRIT: 34.2 % (ref 34.0–46.6)
HEMOGLOBIN: 11.1 g/dL (ref 11.1–15.9)
IMMATURE GRANULOCYTES: 0 %
LYMPHOCYTES ABSOLUTE COUNT: 0.4 10*3/uL — ABNORMAL LOW (ref 0.7–3.1)
LYMPHOCYTES RELATIVE PERCENT: 8 %
MEAN CORPUSCULAR HEMOGLOBIN CONC: 32.5 g/dL (ref 31.5–35.7)
MEAN CORPUSCULAR HEMOGLOBIN: 29.7 pg (ref 26.6–33.0)
MEAN CORPUSCULAR VOLUME: 91 fL (ref 79–97)
MONOCYTES ABSOLUTE COUNT: 0.5 10*3/uL (ref 0.1–0.9)
MONOCYTES RELATIVE PERCENT: 10 %
NEUTROPHILS ABSOLUTE COUNT: 4.3 10*3/uL (ref 1.4–7.0)
NEUTROPHILS RELATIVE PERCENT: 79 %
PLATELET COUNT: 362 10*3/uL (ref 150–450)
RED BLOOD CELL COUNT: 3.74 x10E6/uL — ABNORMAL LOW (ref 3.77–5.28)
RED CELL DISTRIBUTION WIDTH: 13.6 % (ref 11.7–15.4)
WHITE BLOOD CELL COUNT: 5.4 10*3/uL (ref 3.4–10.8)

## 2019-10-09 LAB — RENAL FUNCTION PANEL
ALBUMIN: 3.8 g/dL (ref 3.8–4.8)
BLOOD UREA NITROGEN: 20 mg/dL (ref 6–20)
BUN / CREAT RATIO: 12 (ref 9–23)
CALCIUM: 9.5 mg/dL (ref 8.7–10.2)
CHLORIDE: 108 mmol/L — ABNORMAL HIGH (ref 96–106)
CO2: 22 mmol/L (ref 20–29)
CREATININE: 1.72 mg/dL — ABNORMAL HIGH (ref 0.57–1.00)
GFR MDRD AF AMER: 45 mL/min/{1.73_m2} — ABNORMAL LOW
GFR MDRD NON AF AMER: 39 mL/min/{1.73_m2} — ABNORMAL LOW
GLUCOSE: 90 mg/dL (ref 65–99)
PHOSPHORUS, SERUM: 2.5 mg/dL — ABNORMAL LOW (ref 3.0–4.3)
POTASSIUM: 4.9 mmol/L (ref 3.5–5.2)
SODIUM: 143 mmol/L (ref 134–144)

## 2019-10-09 LAB — MAGNESIUM: MAGNESIUM: 1.7 mg/dL (ref 1.6–2.3)

## 2019-10-09 LAB — LIPASE: LIPASE: 21 U/L (ref 14–72)

## 2019-10-09 LAB — AMYLASE: AMYLASE: 103 U/L (ref 31–110)

## 2019-10-10 LAB — TACROLIMUS LEVEL: TACROLIMUS BLOOD: 7.2 ng/mL (ref 2.0–20.0)

## 2019-10-11 DIAGNOSIS — Z940 Kidney transplant status: Principal | ICD-10-CM

## 2019-10-11 DIAGNOSIS — Z9483 Pancreas transplant status: Principal | ICD-10-CM

## 2019-10-11 DIAGNOSIS — D849 Immunodeficiency, unspecified: Principal | ICD-10-CM

## 2019-10-11 MED ORDER — TACROLIMUS XR 4 MG TABLET,EXTENDED RELEASE 24 HR
ORAL_TABLET | ORAL | 11 refills | 0.00000 days | Status: CP
Start: 2019-10-11 — End: 2019-10-11
  Filled 2019-10-11: qty 120, 30d supply, fill #0

## 2019-10-11 MED ORDER — TACROLIMUS XR 4 MG TABLET,EXTENDED RELEASE 24 HR: tablet | 11 refills | 0 days | Status: AC

## 2019-10-11 MED ORDER — TACROLIMUS XR 1 MG TABLET,EXTENDED RELEASE 24 HR: tablet | 11 refills | 0 days | Status: AC

## 2019-10-11 MED ORDER — TACROLIMUS XR 1 MG TABLET,EXTENDED RELEASE 24 HR
ORAL_TABLET | ORAL | 11 refills | 0.00000 days | Status: CP
Start: 2019-10-11 — End: 2019-10-11

## 2019-10-11 MED FILL — ENVARSUS XR 4 MG TABLET,EXTENDED RELEASE: 28 days supply | Qty: 140 | Fill #0 | Status: AC

## 2019-10-11 MED FILL — MYCOPHENOLATE SODIUM 180 MG TABLET,DELAYED RELEASE: 30 days supply | Qty: 120 | Fill #2 | Status: AC

## 2019-10-11 MED FILL — ENVARSUS XR 1 MG TABLET,EXTENDED RELEASE: 30 days supply | Qty: 120 | Fill #0 | Status: AC

## 2019-10-11 MED FILL — MYCOPHENOLATE SODIUM 180 MG TABLET,DELAYED RELEASE: ORAL | 30 days supply | Qty: 120 | Fill #2

## 2019-10-11 MED FILL — SODIUM BICARBONATE 650 MG TABLET: ORAL | 30 days supply | Qty: 180 | Fill #2

## 2019-10-11 MED FILL — SULFAMETHOXAZOLE 400 MG-TRIMETHOPRIM 80 MG TABLET: 28 days supply | Qty: 12 | Fill #2 | Status: AC

## 2019-10-11 MED FILL — ENVARSUS XR 4 MG TABLET,EXTENDED RELEASE: 28 days supply | Qty: 140 | Fill #0

## 2019-10-11 MED FILL — SULFAMETHOXAZOLE 400 MG-TRIMETHOPRIM 80 MG TABLET: ORAL | 28 days supply | Qty: 12 | Fill #2

## 2019-10-11 MED FILL — CHOLECALCIFEROL (VITAMIN D3) 125 MCG (5,000 UNIT) CAPSULE: ORAL | 100 days supply | Qty: 100 | Fill #1

## 2019-10-11 MED FILL — SODIUM BICARBONATE 650 MG TABLET: 30 days supply | Qty: 180 | Fill #2 | Status: AC

## 2019-10-11 MED FILL — CHOLECALCIFEROL (VITAMIN D3) 125 MCG (5,000 UNIT) CAPSULE: 100 days supply | Qty: 100 | Fill #1 | Status: AC

## 2019-10-11 NOTE — Unmapped (Addendum)
Elaine Koch - [facility] Refill Coordination Note    Specialty Medication(s) to be Shipped:   Transplant: mycophenolate mofetil 180mg   Other medication(s) to be shipped: VitaminD , Sodium Bicarbonate 650mg , Sulfa-Trime 400-80mg    **Pt will get Envarsus at out-patient pharmacy. Denied refills at this time.[employer], DOB: 1986-01-03  Phone: [phone] (home)     All above HIPAA information was verified with patient.     Was a Nurse, learning disability used for this call? No    Completed refill call assessment today to schedule patient's medication shipment from the [facility] [phone]).       Specialty medication(s) and dose(s) confirmed: Regimen is correct and unchanged.   Changes to medications: Doreather reports no changes at this time.  Changes to insurance: No  Questions for the pharmacist: No    Confirmed patient received Welcome Packet with first shipment. The patient will receive a drug information handout for each medication shipped and additional FDA Medication Guides as required.       DISEASE/MEDICATION-SPECIFIC INFORMATION        N/A    SPECIALTY MEDICATION ADHERENCE     Medication Adherence    Patient reported X missed doses in the last month: 0  Specialty Medication: mycophenolate 180mg   Patient is on additional specialty medications: Yes  Additional Specialty Medications: Envarsus 4mg   Patient Reported Additional Medication X Missed Doses in the Last Month: 0  Patient is on more than two specialty medications: No  Informant: patient  Reliability of informant: reliable        Envarsus XR 1 mg: 0 days of medicine on hand   Envarsus XR 4 mg: 0 days of medicine on hand   Mycophenolate 180 mg: 7 days of medicine on hand     SHIPPING     Shipping address confirmed in Epic.     Delivery Scheduled: Yes, Expected medication delivery date: 10/12/2019.     Medication will be delivered via UPS to the prescription address in Epic WAM.    Dejane Scheibe P Wetzel [place] [facility] Specialty Technician

## 2019-10-11 NOTE — Unmapped (Addendum)
Pt report she is out of envarsus today. Advised to pick it up from [facility] today because local pharmacy having issues.

## 2019-10-12 DIAGNOSIS — Z940 Kidney transplant status: Principal | ICD-10-CM

## 2019-10-12 DIAGNOSIS — Z9483 Pancreas transplant status: Principal | ICD-10-CM

## 2019-10-13 ENCOUNTER — Ambulatory Visit: Admit: 2019-10-13 | Discharge: 2019-10-13 | Payer: MEDICARE

## 2019-10-13 ENCOUNTER — Ambulatory Visit: Admit: 2019-10-13 | Discharge: 2019-10-13 | Payer: MEDICARE | Attending: Nephrology | Primary: Nephrology

## 2019-10-13 DIAGNOSIS — E118 Type 2 diabetes mellitus with unspecified complications: Principal | ICD-10-CM

## 2019-10-13 DIAGNOSIS — Z79899 Other long term (current) drug therapy: Principal | ICD-10-CM

## 2019-10-13 DIAGNOSIS — Z940 Kidney transplant status: Principal | ICD-10-CM

## 2019-10-13 DIAGNOSIS — Z9483 Pancreas transplant status: Principal | ICD-10-CM

## 2019-10-13 DIAGNOSIS — E559 Vitamin D deficiency, unspecified: Principal | ICD-10-CM

## 2019-10-13 LAB — COMPREHENSIVE METABOLIC PANEL
ALBUMIN: 3.5 g/dL (ref 3.4–5.0)
ALKALINE PHOSPHATASE: 70 U/L (ref 46–116)
ALT (SGPT): 36 U/L (ref 10–49)
ANION GAP: 6 mmol/L (ref 5–14)
BILIRUBIN TOTAL: 0.5 mg/dL (ref 0.3–1.2)
BLOOD UREA NITROGEN: 16 mg/dL (ref 9–23)
BUN / CREAT RATIO: 12
CALCIUM: 9.6 mg/dL (ref 8.7–10.4)
CHLORIDE: 111 mmol/L — ABNORMAL HIGH (ref 98–107)
CO2: 24 mmol/L (ref 20.0–31.0)
CREATININE: 1.36 mg/dL — ABNORMAL HIGH
EGFR CKD-EPI AA FEMALE: 59 mL/min/{1.73_m2} — ABNORMAL LOW (ref >=60)
EGFR CKD-EPI NON-AA FEMALE: 52 mL/min/{1.73_m2} — ABNORMAL LOW (ref >=60)
GLUCOSE RANDOM: 88 mg/dL (ref 70–179)
POTASSIUM: 4.3 mmol/L (ref 3.4–4.5)
PROTEIN TOTAL: 7 g/dL (ref 5.7–8.2)
SODIUM: 141 mmol/L (ref 135–145)

## 2019-10-13 LAB — URINALYSIS
BILIRUBIN UA: NEGATIVE
BLOOD UA: NEGATIVE
GLUCOSE UA: NEGATIVE
KETONES UA: NEGATIVE
PH UA: 6 (ref 5.0–9.0)
PROTEIN UA: NEGATIVE
RBC UA: <1 /HPF (ref <4)
SPECIFIC GRAVITY UA: 1.02 (ref 1.005–1.030)
SQUAMOUS EPITHELIAL: 11 /HPF — ABNORMAL HIGH (ref 0–5)
TRANSITIONAL EPITHELIAL: <1 /HPF (ref 0–2)
UROBILINOGEN UA: 0.2
WBC UA: <1 /HPF (ref 0–5)

## 2019-10-13 LAB — CBC W/ AUTO DIFF
BASOPHILS ABSOLUTE COUNT: 0 10*9/L (ref 0.0–0.1)
EOSINOPHILS ABSOLUTE COUNT: 0.1 10*9/L (ref 0.0–0.7)
EOSINOPHILS RELATIVE PERCENT: 1.7 %
HEMATOCRIT: 34.1 % — ABNORMAL LOW (ref 35.0–44.0)
HEMOGLOBIN: 11.3 g/dL — ABNORMAL LOW (ref 12.0–15.5)
LYMPHOCYTES ABSOLUTE COUNT: 0.5 10*9/L — ABNORMAL LOW (ref 0.7–4.0)
MEAN CORPUSCULAR HEMOGLOBIN CONC: 33.2 g/dL (ref 30.0–36.0)
MEAN CORPUSCULAR HEMOGLOBIN: 30.3 pg (ref 26.0–34.0)
MEAN CORPUSCULAR VOLUME: 91.4 fL (ref 82.0–98.0)
MEAN PLATELET VOLUME: 7.4 fL (ref 7.0–10.0)
MONOCYTES ABSOLUTE COUNT: 0.4 10*9/L (ref 0.1–1.0)
MONOCYTES RELATIVE PERCENT: 7.7 %
NEUTROPHILS ABSOLUTE COUNT: 4.2 10*9/L (ref 1.7–7.7)
NEUTROPHILS RELATIVE PERCENT: 80.9 %
NUCLEATED RED BLOOD CELLS: 0 /100{WBCs} (ref <=4)
PLATELET COUNT: 394 10*9/L (ref 150–450)
RED BLOOD CELL COUNT: 3.73 10*12/L — ABNORMAL LOW (ref 3.90–5.03)
RED CELL DISTRIBUTION WIDTH: 13.9 % (ref 12.0–15.0)
WBC ADJUSTED: 5.2 10*9/L (ref 3.5–10.5)

## 2019-10-13 LAB — UROBILINOGEN UA: Lab: 0.2

## 2019-10-13 LAB — PROTEIN/CREAT RATIO, URINE: Protein/Creatinine:MRto:Pt:Urine:Qn:: 0.101

## 2019-10-13 LAB — PHOSPHORUS
PHOSPHORUS: 2.6 mg/dL (ref 2.4–5.1)
Phosphate:MCnc:Pt:Ser/Plas:Qn:: 2.6

## 2019-10-13 LAB — ESTIMATED AVERAGE GLUCOSE: Estimated average glucose:MCnc:Pt:Bld:Qn:Estimated from glycated hemoglobin: 77

## 2019-10-13 LAB — HEMOGLOBIN A1C: ESTIMATED AVERAGE GLUCOSE: 77 mg/dL

## 2019-10-13 LAB — LYMPHOCYTES ABSOLUTE COUNT: Lymphocytes:NCnc:Pt:Bld:Qn:Automated count: 0.5 — ABNORMAL LOW

## 2019-10-13 LAB — PARATHYROID HORMONE INTACT: Parathyrin.intact:MCnc:Pt:Ser/Plas:Qn:: 135.5 — ABNORMAL HIGH

## 2019-10-13 LAB — ANION GAP: Anion gap 3:SCnc:Pt:Ser/Plas:Qn:: 6

## 2019-10-13 LAB — PROTEIN / CREATININE RATIO, URINE: CREATININE, URINE: 123.2 mg/dL

## 2019-10-13 LAB — MAGNESIUM: Magnesium:MCnc:Pt:Ser/Plas:Qn:: 1.5 — ABNORMAL LOW

## 2019-10-13 NOTE — Unmapped (Signed)
Transplant Nephrology Clinic Visit      History of Present Illness    Elaine Koch is a 34 y.o. 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 She does not have evidence of donor specific antibodies as of 07/15/2019. Her most recent baseline creatinine is between 1.3-1.5, occasionally up to 1.7.    She was on hemodialysis for about 3 years prior to transplant through an AVF. Her diabetes has been complicated by retinopathy. She had pre-eclampsia during pregnancy in 2017. She also had severe anemia requiring multiple blood transfusions during pregnancy and for some time after. In spite of that she had a cPRA of 0% prior to transplant.    She was admitted from 3/21 through 05/11/19 for the initial transplant. Her course was fairly uncomplicated. On POD 8 did have a transient bump in her creatinine (1.39 to 1.57) and amylase (76 to 112), came down to 1.52 and 89 with fluids. [place] of both kidney and pancreas showed patent vessels and normal RIs. After discharge, she had some sugars in the 120s. On 4/2 reported BP 88/58 so amlodipine was held. At surgery visit 4/8 reported loose stools, which she had pre-transplant. Reglan was decreased. BP 80s/40s so all antihypertensives were held. At visit 4/15 pressures remained low so midodrine 5 mg daily was started, reglan further decreased. Her amylase was 467 at that visit so prednisone 5 mg was started since tacrolimus was sub-therapeutic, with plans to taper off once tac stable and pancreatic enzymes normal. At nephrology visit 4/20, amylase down to 179 and tacrolimus level better on TID dosing. Ureteral stent removed 06/10/19. At visit 5/25 planned transition to Envarsus given high tacrolimus requirement. Amylase remained elevated but stable at 149. Blood pressure improved and was no longer hypotensive.     She called the on-call coordinator on 07/11/19 complaining of headache, nausea and vomiting, diarrhea the night prior. No fever. Was able to keep morning meds down. Recommended bland diet, push fluids. On 6/1 called to report BP 143/110 with chest pain, headache and vomiting and was referred to ED. She was admitted from 6/1 through 07/16/19. On admission she was noted to have a creatinine of 3.22 (from 1.19 on 5/28) in the setting of a tacrolimus level of 27.9. Tacrolimus was held and level came down. She was hypotensive and given IVF. She was found to be C.diff positive and started on vancomycin with plans for 10 day course. Creatinine to 1.32 on discharge. Wbc 0.8 on discharge with ANC 0.5. CMV negative on 6/1. Was discharged on Myfortic 720 mg BID.    On 07/20/19 she was noted to have a wbc < 0.6 and ANC < 0.2. She reported no symptoms and was instructed to hold Myfortic and Valcyte on 6/10 and received a dose of Granix. On 6/10 wbc to 0.6 with ANC 0.3. On 6/16 she reported diarrhea returned after stopping vancomycin, course was extended for an additional 14 days. Wbc improved to 3.9 on 6/14 and then 6.1 on 6/17 so Myfortic restarted at 180 mg BID. Creatinine to 1.56 on 6/17 and 1.49 on 6/21, reported had not been drinking as much since back at work. Wbc 8.6 on 6/21 with ANC 7.5. Most recent lipase has been 90-122.    She followed up with ID on 08/31/19, who placed a referral to Dr. Fara Boros with GI for possible FMT. Patient finished fidaxomicin on 09/06/19 and restarted Vancomycin. When this occurred, patient's diarrhea returned. Patient's vancomycin stopped and she  restarted Fidaxomicin on 09/13/19. She completed her Bezlotoxumab on 09/17/19.     Interval since last visit 09/24/19    She was admitted from 8/20 through 10/03/19 after presenting with LLQ pain, myalgias, headaches, and low grade fevers. Urine grew 50-100K Enterococcus, no other source found, CT abd/pelvis, TTE and renal US all unremarkable. Treated with 10 day course of linezolid.    She was noted to have creatinine up to 1.72 on 10/08/19, reported feeling well.    She presents today for follow up.    She has been feeling well. Her diarrhea has been better, though she had 3 episodes yesterday which is unusual, thinks it was something that she ate as her son had diarrhea as well. Today her BMs have been normal. She has completed Difcid, is on prolonged vancomycin taper.    She is back to work now, has a son aged 29.    She specifically denies fever, myalgias, upper respiratory/GI symptoms, or change in taste/smell.    Last dose of Prograf: 9:00 am.    Review of Systems    Otherwise on review of systems patient denies fever or chills, chest pain, SOB, PND or orthopnea, lower extremity edema. Denies N/V/abdominal pain. No dysuria, hematuria or difficulty voiding. Bowel movements without blood. Denies joint pain or rash. All other systems are reviewed and are negative.    Medications    Current Outpatient Medications   Medication Sig Dispense Refill   ??? aspirin (ECOTRIN) 81 MG tablet Take 1 tablet (81 mg total) by mouth daily. 30 tablet 11   ??? biotin 5 mg tablet Take 1 tablet (5 mg total) by mouth daily. 30 tablet 11   ??? cholecalciferol, vitamin D3-125 mcg, 5,000 unit,, 125 mcg (5,000 unit) capsule Take 1 capsule (125 mcg total) by mouth daily. 100 capsule 10   ??? multivitamin, prenatal, folic acid-iron, 27-1 mg Tab Take 1 tablet by mouth daily.     ??? mycophenolate (MYFORTIC) 180 MG EC tablet Take 2 tablets (360 mg total) by mouth Two (2) times a day. 120 tablet 11   ??? predniSONE (DELTASONE) 5 MG tablet Take 1 tablet (5 mg total) by mouth daily. 30 tablet 11   ??? sodium bicarbonate 650 mg tablet Take 2 tablets (1,300 mg total) by mouth Three (3) times a day. 180 tablet 3   ??? sulfamethoxazole-trimethoprim (BACTRIM) 400-80 mg per tablet Take 1 tablet (80 mg of trimethoprim total) by mouth Every Monday, Wednesday, and Friday. 12 tablet 2   ??? tacrolimus (ENVARSUS XR) 1 mg Tb24 extended release tablet Take 4 tablets (4mg ) once daily in addition to 4mg  tablets for total dose of 24 mg. 120 tablet 11   ??? tacrolimus (ENVARSUS XR) 4 mg Tb24 extended release tablet Take 5 tablets (20mg ) by mouth once daily in addition to 1 mg tablets for total dose of 24 mg. 140 tablet 11   ??? vancomycin (VANCOCIN) 125 MG capsule 4x/d for 14d, 2x/d for 7d, 1x/d for 7d, every 2d x 7d, every 3d x 14d (Patient not taking: Reported on 10/04/2019) 85 capsule 1     No current facility-administered medications for this visit.       Physical Exam    BP 132/75  - Pulse 87  - Temp 35.7 ??C (96.3 ??F) (Temporal)  - Ht 165.1 cm (5' 5)  - Wt 78.9 kg (174 lb)  - BMI 28.96 kg/m??   General: Patient is a pleasant female in no apparent distress.  Eyes: Sclera anicteric.  ENT: Mask in place.  Neck: Supple without LAD/JVD/bruits.  Lungs: Clear to auscultation bilaterally, no wheezes/rales/rhonchi.  Cardiovascular: Regular rate and rhythm without murmurs, rubs or gallops.  Abdomen: Soft, notender/nondistended. Positive bowel sounds. No tenderness over the graft.  Extremities: Without edema, joints without evidence of synovitis.  Skin: Without rash.  Neurological: Grossly nonfocal.  Psychiatric: Mood and affect appropriate.    Laboratory Results    Results for orders placed or performed in visit on 10/13/19   Urine Culture    Specimen: Clean Catch; Urine   Result Value Ref Range    Urine Culture, Comprehensive <10,000 CFU/mL    Hemoglobin A1c   Result Value Ref Range    Hemoglobin A1C 4.3 (L) 4.8 - 5.6 %    Estimated Average Glucose 77 mg/dL   BK Virus Quantitative PCR, Blood   Result Value Ref Range    BK Blood Result Not Detected Not Detected    BK Blood Quant      BK Blood Log(10)      Bk Blood Comment     Parathyroid Hormone (PTH)   Result Value Ref Range    PTH 135.5 (H) 18.4 - 80.1 pg/mL   Vitamin D 25 Hydroxy (25OH D2 + D3)   Result Value Ref Range    Vitamin D Total (25OH) 41.6 20.0 - 80.0 ng/mL   CMV DNA, quantitative, PCR   Result Value Ref Range    CMV Viral Ld      CMV Quant 139 (H) <0 IU/mL    CMV Quant Log10 2.14 (H) <0.00 log IU/mL    CMV Comment     Urinalysis   Result Value Ref Range    Color, UA Yellow     Clarity, UA Clear     Specific Gravity, UA 1.020 1.005 - 1.030    pH, UA 6.0 5.0 - 9.0    Leukocyte Esterase, UA Negative Negative    Nitrite, UA Negative Negative    Protein, UA Negative Negative    Glucose, UA Negative Negative    Ketones, UA Negative Negative    Urobilinogen, UA 0.2 mg/dL 0.2 - 2.0 mg/dL    Bilirubin, UA Negative Negative    Blood, UA Negative Negative    RBC, UA <1 <4 /HPF    WBC, UA <1 0 - 5 /HPF    Squam Epithel, UA 11 (H) 0 - 5 /HPF    Bacteria, UA Rare (A) None Seen /HPF    Trans Epithel, UA <1 0 - 2 /HPF   Protein/Creatinine Ratio, Urine   Result Value Ref Range    Creat U 123.2 Undefined mg/dL    Protein, Ur 16.1 mg/dL    Protein/Creatinine Ratio, Urine 0.101 Undefined   Tacrolimus Level, Trough   Result Value Ref Range    Tacrolimus, Trough 6.5 5.0 - 15.0 ng/mL   Magnesium Level   Result Value Ref Range    Magnesium 1.5 (L) 1.6 - 2.6 mg/dL   Comprehensive Metabolic Panel   Result Value Ref Range    Sodium 141 135 - 145 mmol/L    Potassium 4.3 3.4 - 4.5 mmol/L    Chloride 111 (H) 98 - 107 mmol/L    Anion Gap 6 5 - 14 mmol/L    CO2 24.0 20.0 - 31.0 mmol/L    BUN 16 9 - 23 mg/dL    Creatinine 0.96 (H) 0.60 - 0.80 mg/dL    BUN/Creatinine Ratio 12     EGFR CKD-EPI Non-African American, Female  52 (L) >=60 mL/min/1.64m2    EGFR CKD-EPI African American, Female 59 (L) >=60 mL/min/1.38m2    Glucose 88 70 - 179 mg/dL    Calcium 9.6 8.7 - 16.1 mg/dL    Albumin 3.5 3.4 - 5.0 g/dL    Total Protein 7.0 5.7 - 8.2 g/dL    Total Bilirubin 0.5 0.3 - 1.2 mg/dL    AST 23 <=09 U/L    ALT 36 10 - 49 U/L    Alkaline Phosphatase 70 46 - 116 U/L   Phosphorus Level   Result Value Ref Range    Phosphorus 2.6 2.4 - 5.1 mg/dL   Cytology - Urine   Result Value Ref Range    Final Diagnosis       Urine, voided  -No malignant cells identified  -No decoy cells identified  -Predominantly squamous cells    This electronic signature is attestation that the pathologist personally reviewed the submitted material(s) and the final diagnosis reflects that evaluation.      Gross Description            Urine voided decoy    30mL clear, yellow fluid, unfixed    Materials Prepared & Examined    Smear Slides.......0  Monolayers..........1  Cytospins.............0  Cell Blocks...........0  Core Biopsy.........0  Touch Prep..........0          Microscopic Description       Microscopic examination substantiates the above diagnosis.    Resident Physician: None Assigned      EMBEDDED IMAGES      Specimen Adequacy Satisfactory for evaluation     Disclaimer       Unless otherwise specified, specimens are preserved using 10% neutral buffered formalin. For cases in which immunohistochemical and/or in-situ hybridization stains are performed, the following statement applies: Appropriate controls for each stain (positive controls with or without negative controls) have been evaluated and stain as expected. These stains have not been separately validated for use on decalcified specimens and should be interpreted with caution in that setting. Some of the reagents used for these stains may be classified as analyte specific reagents (ASR). Tests using ASRs were developed, and their performance characteristics were determined, by the Anatomic Pathology Department [facility]). They have not been cleared or approved by the [place] Food and Drug Administration (FDA). The FDA does not require these tests to go through premarket FDA review. These tests are used for clinical purposes. They should not be regarded as investigational or for research. This laboratory is certified under the Clinical Laboratory Improvement Amendments (CLIA) as qualified to perform high complexity clinical laboratory testing.     CBC w/ Differential   Result Value Ref Range    WBC 5.2 3.5 - 10.5 10*9/L    RBC 3.73 (L) 3.90 - 5.03 10*12/L    HGB 11.3 (L) 12.0 - 15.5 g/dL    HCT 60.4 (L) 54.0 - 44.0 %    MCV 91.4 82.0 - 98.0 fL    MCH 30.3 26.0 - 34.0 pg    MCHC 33.2 30.0 - 36.0 g/dL    RDW 98.1 19.1 - 47.8 %    MPV 7.4 7.0 - 10.0 fL    Platelet 394 150 - 450 10*9/L    nRBC 0 <=4 /100 WBCs    Neutrophils % 80.9 %    Lymphocytes % 9.0 %    Monocytes % 7.7 %    Eosinophils % 1.7 %    Basophils % 0.7 %  Absolute Neutrophils 4.2 1.7 - 7.7 10*9/L    Absolute Lymphocytes 0.5 (L) 0.7 - 4.0 10*9/L    Absolute Monocytes 0.4 0.1 - 1.0 10*9/L    Absolute Eosinophils 0.1 0.0 - 0.7 10*9/L    Absolute Basophils 0.0 0.0 - 0.1 10*9/L       Assessment and Plan      1. Status post renal transplant. Her creatinine is 1.36 today and within her most recent baseline, unclear why up to 1.7 earlier this week. Envarsus trough 6.5 today, and is a 28 hour trough. Likely within goal of 8-10. Will continue current immunosuppression.    2. Status post pancreas transplant. She has had a mildly elevated lipase since transplant. Peaked at 467 on 05/27/19. On 10/08/19 amylase mildly elevated at 103 and lipase normal at 21. Hgb A1c reassuring at 4.3%.    3. History of hypertension. Blood pressure reasonable off meds, may consider adding ACEi/ARB at some point given history of diabetes.    4. C.diff/low positive CMV. Diarrhea improved, on prolonged vancomycin taper. Patient referred to Dr. Fara Boros with GI for possible FMT. If CMV increases may consider decreasing Myfortic.    5. Health maintenance. She received a flu shot this fall. She has had a Pnemovax vaccine, needs Prevnar 13. Received both Covid vaccines, sent for booster today.     We discussed that while we continue to recommend the COVID vaccine for all eligible patients, we know that kidney transplant patients do not respond as strongly due to their immunosuppression, so she should continue to follow the CDC guidelines for patients who are not vaccinated. I would also encourage all household members and close contacts that are eligible for vaccination to get vaccinated. We discussed the importance of ongoing social distancing, wearing a mask outside the home, and hand washing/sanitizing.     6. Will see patient back in 6 weeks, or sooner if needed.

## 2019-10-14 DIAGNOSIS — Z940 Kidney transplant status: Principal | ICD-10-CM

## 2019-10-14 DIAGNOSIS — Z9483 Pancreas transplant status: Principal | ICD-10-CM

## 2019-10-14 LAB — TACROLIMUS, TROUGH: Lab: 6.5

## 2019-10-14 LAB — VITAMIN D, TOTAL (25OH): Lab: 41.6

## 2019-10-15 LAB — BK BLOOD RESULT: Lab: NOT DETECTED

## 2019-10-15 LAB — BK VIRUS QUANTITATIVE PCR, BLOOD: BK BLOOD RESULT: NOT DETECTED

## 2019-10-16 LAB — CMV QUANT LOG10: Lab: 2.14 — ABNORMAL HIGH

## 2019-10-16 LAB — CMV DNA, QUANTITATIVE, PCR: CMV QUANT: 139 [IU]/mL — ABNORMAL HIGH (ref <0)

## 2019-10-18 DIAGNOSIS — Z940 Kidney transplant status: Principal | ICD-10-CM

## 2019-10-18 DIAGNOSIS — D849 Immunodeficiency, unspecified: Principal | ICD-10-CM

## 2019-10-18 DIAGNOSIS — Z9483 Pancreas transplant status: Principal | ICD-10-CM

## 2019-10-19 DIAGNOSIS — Z9483 Pancreas transplant status: Principal | ICD-10-CM

## 2019-10-19 DIAGNOSIS — Z940 Kidney transplant status: Principal | ICD-10-CM

## 2019-10-19 DIAGNOSIS — D849 Immunodeficiency, unspecified: Principal | ICD-10-CM

## 2019-10-19 NOTE — Unmapped (Signed)
[facility] updated orders per nurse

## 2019-10-21 DIAGNOSIS — Z940 Kidney transplant status: Principal | ICD-10-CM

## 2019-10-21 DIAGNOSIS — Z9483 Pancreas transplant status: Principal | ICD-10-CM

## 2019-10-23 LAB — LIPASE: Triacylglycerol lipase:CCnc:Pt:Ser/Plas:Qn:: 15

## 2019-10-23 LAB — RENAL FUNCTION PANEL
BLOOD UREA NITROGEN: 20 mg/dL (ref 6–20)
CALCIUM: 9.8 mg/dL (ref 8.7–10.2)
CHLORIDE: 104 mmol/L (ref 96–106)
CO2: 23 mmol/L (ref 20–29)
CREATININE: 1.62 mg/dL — ABNORMAL HIGH (ref 0.57–1.00)
GFR MDRD AF AMER: 48 mL/min/{1.73_m2} — ABNORMAL LOW
GLUCOSE: 89 mg/dL (ref 65–99)
PHOSPHORUS, SERUM: 2.7 mg/dL — ABNORMAL LOW (ref 3.0–4.3)
POTASSIUM: 5 mmol/L (ref 3.5–5.2)
SODIUM: 137 mmol/L (ref 134–144)

## 2019-10-23 LAB — CBC W/ DIFFERENTIAL
BANDED NEUTROPHILS ABSOLUTE COUNT: 0 10*3/uL (ref 0.0–0.1)
BASOPHILS ABSOLUTE COUNT: 0 10*3/uL (ref 0.0–0.2)
BASOPHILS RELATIVE PERCENT: 0 %
EOSINOPHILS RELATIVE PERCENT: 1 %
HEMATOCRIT: 33.6 % — ABNORMAL LOW (ref 34.0–46.6)
HEMOGLOBIN: 11.4 g/dL (ref 11.1–15.9)
IMMATURE GRANULOCYTES: 0 %
LYMPHOCYTES ABSOLUTE COUNT: 0.4 10*3/uL — ABNORMAL LOW (ref 0.7–3.1)
MEAN CORPUSCULAR HEMOGLOBIN CONC: 33.9 g/dL (ref 31.5–35.7)
MEAN CORPUSCULAR HEMOGLOBIN: 30.5 pg (ref 26.6–33.0)
MEAN CORPUSCULAR VOLUME: 90 fL (ref 79–97)
MONOCYTES ABSOLUTE COUNT: 0.9 10*3/uL (ref 0.1–0.9)
MONOCYTES RELATIVE PERCENT: 9 %
NEUTROPHILS ABSOLUTE COUNT: 8 10*3/uL — ABNORMAL HIGH (ref 1.4–7.0)
NEUTROPHILS RELATIVE PERCENT: 86 %
PLATELET COUNT: 256 10*3/uL (ref 150–450)
RED BLOOD CELL COUNT: 3.74 x10E6/uL — ABNORMAL LOW (ref 3.77–5.28)
RED CELL DISTRIBUTION WIDTH: 13.9 % (ref 11.7–15.4)
WHITE BLOOD CELL COUNT: 9.4 10*3/uL (ref 3.4–10.8)

## 2019-10-23 LAB — GLUCOSE: Glucose:MCnc:Pt:Ser/Plas:Qn:: 89

## 2019-10-23 LAB — BASOPHILS ABSOLUTE COUNT: Basophils:NCnc:Pt:Bld:Qn:Automated count: 0

## 2019-10-23 LAB — AMYLASE: Amylase:CCnc:Pt:Ser/Plas:Qn:: 122 — ABNORMAL HIGH

## 2019-10-23 LAB — MAGNESIUM: Magnesium:MCnc:Pt:Ser/Plas:Qn:: 1.6

## 2019-10-25 DIAGNOSIS — E0929 Drug or chemical induced diabetes mellitus with other diabetic kidney complication: Principal | ICD-10-CM

## 2019-10-25 DIAGNOSIS — Z9483 Pancreas transplant status: Principal | ICD-10-CM

## 2019-10-25 DIAGNOSIS — D849 Immunodeficiency, unspecified: Principal | ICD-10-CM

## 2019-10-25 DIAGNOSIS — Z940 Kidney transplant status: Principal | ICD-10-CM

## 2019-10-25 LAB — TACROLIMUS BLOOD: Tacrolimus:MCnc:Pt:Bld:Qn:LC/MS/MS: 6.8

## 2019-10-25 MED ORDER — TACROLIMUS XR 1 MG TABLET,EXTENDED RELEASE 24 HR: tablet | 11 refills | 0 days | Status: AC

## 2019-10-25 MED ORDER — TACROLIMUS XR 1 MG TABLET,EXTENDED RELEASE 24 HR
ORAL_TABLET | ORAL | 11 refills | 0.00000 days | Status: CP
Start: 2019-10-25 — End: 2019-10-25

## 2019-10-25 MED ORDER — TACROLIMUS XR 4 MG TABLET,EXTENDED RELEASE 24 HR: tablet | 11 refills | 0 days | Status: AC

## 2019-10-25 MED ORDER — TACROLIMUS XR 4 MG TABLET,EXTENDED RELEASE 24 HR
ORAL_TABLET | ORAL | 11 refills | 0.00000 days | Status: CP
Start: 2019-10-25 — End: 2019-10-25

## 2019-10-26 DIAGNOSIS — Z9483 Pancreas transplant status: Principal | ICD-10-CM

## 2019-10-26 DIAGNOSIS — Z940 Kidney transplant status: Principal | ICD-10-CM

## 2019-10-26 MED ORDER — TACROLIMUS XR 4 MG TABLET,EXTENDED RELEASE 24 HR
ORAL_TABLET | Freq: Every day | ORAL | 11 refills | 30 days | Status: CP
Start: 2019-10-26 — End: 2020-10-25
  Filled 2019-11-04: qty 180, 30d supply, fill #0

## 2019-10-26 NOTE — Unmapped (Addendum)
Take six 4mg  tablets (24mg ) by mouth once daily in addition to two 1 mg tablets for total dose of 26 mg.

## 2019-10-26 NOTE — Unmapped (Signed)
Clinical Assessment Needed For: Dose Change  Medication: Envarsus  Last Fill Date: 10/11/2019  Copay $0.00  Was previous dose already scheduled to fill: No    Notes to Pharmacist: Envarsus 4 is only a 23 day supply.

## 2019-10-28 DIAGNOSIS — Z9483 Pancreas transplant status: Principal | ICD-10-CM

## 2019-10-28 DIAGNOSIS — Z940 Kidney transplant status: Principal | ICD-10-CM

## 2019-10-29 NOTE — Unmapped (Signed)
9/17: speaking with patient re: envarsus dose change -ef    [facility] Clinical Assessment & Refill Coordination Note    Melissaann Dizdarevic, DOB: [birth year]  Phone: [phone] (home)     All above HIPAA information was verified with patient.     Was a Nurse, learning disability used for this call? No    Specialty Medication(s):   Transplant: Envarsus 1mg , Envarsus 4mg  and  mycophenolic acid 180mg      Current Outpatient Medications   Medication Sig Dispense Refill   ??? aspirin (ECOTRIN) 81 MG tablet Take 1 tablet (81 mg total) by mouth daily. 30 tablet 11   ??? biotin 5 mg tablet Take 1 tablet (5 mg total) by mouth daily. 30 tablet 11   ??? cholecalciferol, vitamin D3-125 mcg, 5,000 unit,, 125 mcg (5,000 unit) capsule Take 1 capsule (125 mcg total) by mouth daily. 100 capsule 10   ??? multivitamin, prenatal, folic acid-iron, 27-1 mg Tab Take 1 tablet by mouth daily.     ??? mycophenolate (MYFORTIC) 180 MG EC tablet Take 2 tablets (360 mg total) by mouth Two (2) times a day. 120 tablet 11   ??? predniSONE (DELTASONE) 5 MG tablet Take 1 tablet (5 mg total) by mouth daily. 30 tablet 11   ??? sodium bicarbonate 650 mg tablet Take 2 tablets (1,300 mg total) by mouth Three (3) times a day. 180 tablet 3   ??? sulfamethoxazole-trimethoprim (BACTRIM) 400-80 mg per tablet Take 1 tablet (80 mg of trimethoprim total) by mouth Every Monday, Wednesday, and Friday. 12 tablet 2   ??? tacrolimus (ENVARSUS XR) 1 mg Tb24 extended release tablet Take 2 tablets (2 mg total) by mouth once daily with 6 (4 mg) tablets for a total dose of 26 mg daily 60 tablet 11   ??? tacrolimus (ENVARSUS XR) 4 mg Tb24 extended release tablet Take 6 tablets (24 mg total) by mouth daily 180 tablet 11   ??? vancomycin (VANCOCIN) 125 MG capsule 4x/d for 14d, 2x/d for 7d, 1x/d for 7d, every 2d x 7d, every 3d x 14d (Patient not taking: Reported on 10/04/2019) 85 capsule 1     No current facility-administered medications for this visit.        Changes to medications: see envarsus below    Allergies   Allergen Reactions   ??? Iodinated Contrast Media Swelling, Rash and Other (See Comments)     Burning, warmth throughout body and tingling.  Throat swelling.  Treated with benadryl and symptoms improved.  Has not had contrast since then (2013 or 2014).   ??? Nickel Rash   ??? Propranolol Swelling   ??? Eye Irrigating Solution [Ophthalmic Irrigation Solution] Other (See Comments)     Contrast dye (name?) used in eyes caused hot feeling in face, reversed with Benadryl.    ??? Iodine      Other reaction(s): Skin Rash   ??? Naltrexone Rash   ??? Uni-Cortrom Rash       Changes to allergies: No    SPECIALTY MEDICATION ADHERENCE     envarsus 1mg   : 14 days of medicine on hand   envarsus 4mg   : 14 days of medicine on hand   Mycophenolate 180mg   : 20 days of medicine on hand     Medication Adherence    Patient reported X missed doses in the last month: 0  Specialty Medication: envarsus 1mg   Patient is on additional specialty medications: Yes  Additional Specialty Medications: envarsus 4mg   Patient Reported Additional Medication  X Missed Doses in the Last Month: 0  Patient is on more than two specialty medications: Yes  Specialty Medication: mycophenolate 180mg   Patient Reported Additional Medication X Missed Doses in the Last Month: 0          Specialty medication(s) dose(s) confirmed: Patient reports changes to the regimen as follows: envarsus now 26mg  daily     Are there any concerns with adherence? No    Adherence counseling provided? Not needed    CLINICAL MANAGEMENT AND INTERVENTION      Clinical Benefit Assessment:    Do you feel the medicine is effective or helping your condition? Yes    Clinical Benefit counseling provided? Not needed    Adverse Effects Assessment:    Are you experiencing any side effects? No    Are you experiencing difficulty administering your medicine? No    Quality of Life Assessment:    How many days over the past month did your transplant  keep you from your normal activities? For example, brushing your teeth or getting up in the morning. 0    Have you discussed this with your provider? Not needed    Therapy Appropriateness:    Is therapy appropriate? Yes, therapy is appropriate and should be continued    DISEASE/MEDICATION-SPECIFIC INFORMATION      N/A    PATIENT SPECIFIC NEEDS     - Does the patient have any physical, cognitive, or cultural barriers? No    - Is the patient high risk? Yes, patient is taking a REMS drug. Medication is dispensed in compliance with REMS program    - Does the patient require a Care Management Plan? No     - Does the patient require physician intervention or other additional services (i.e. nutrition, smoking cessation, social work)? No      SHIPPING     Specialty Medication(s) to be Shipped:   na    Other medication(s) to be shipped: No additional medications requested for fill at this time   Pt wants call back early next week to try and get all meds sent out together     Changes to insurance: No    Delivery Scheduled: Patient declined refill at this time due to patient wants call back next week to get all meds sent out together.     Medication will be delivered via na to the confirmed na address in [facility].    The patient will receive a drug information handout for each medication shipped and additional FDA Medication Guides as required.  Verified that patient has previously received a Conservation officer, historic buildings.    All of the patient's questions and concerns have been addressed.    Thad Ranger   [facility] Specialty Pharmacist

## 2019-10-30 LAB — RENAL FUNCTION PANEL
ALBUMIN: 3.9 g/dL (ref 3.8–4.8)
BLOOD UREA NITROGEN: 20 mg/dL (ref 6–20)
BUN / CREAT RATIO: 12 (ref 9–23)
CALCIUM: 9.5 mg/dL (ref 8.7–10.2)
CHLORIDE: 105 mmol/L (ref 96–106)
CO2: 21 mmol/L (ref 20–29)
CREATININE: 1.72 mg/dL — ABNORMAL HIGH (ref 0.57–1.00)
GFR MDRD AF AMER: 45 mL/min/{1.73_m2} — ABNORMAL LOW
GFR MDRD NON AF AMER: 39 mL/min/{1.73_m2} — ABNORMAL LOW
GLUCOSE: 100 mg/dL — ABNORMAL HIGH (ref 65–99)
PHOSPHORUS, SERUM: 3 mg/dL (ref 3.0–4.3)
POTASSIUM: 4.7 mmol/L (ref 3.5–5.2)

## 2019-10-30 LAB — CBC W/ DIFFERENTIAL
BANDED NEUTROPHILS ABSOLUTE COUNT: 0 10*3/uL (ref 0.0–0.1)
BASOPHILS ABSOLUTE COUNT: 0 10*3/uL (ref 0.0–0.2)
EOSINOPHILS ABSOLUTE COUNT: 0.1 10*3/uL (ref 0.0–0.4)
EOSINOPHILS RELATIVE PERCENT: 1 %
HEMATOCRIT: 33 % — ABNORMAL LOW (ref 34.0–46.6)
HEMOGLOBIN: 11 g/dL — ABNORMAL LOW (ref 11.1–15.9)
IMMATURE GRANULOCYTES: 0 %
LYMPHOCYTES ABSOLUTE COUNT: 0.6 10*3/uL — ABNORMAL LOW (ref 0.7–3.1)
LYMPHOCYTES RELATIVE PERCENT: 6 %
MEAN CORPUSCULAR HEMOGLOBIN CONC: 33.3 g/dL (ref 31.5–35.7)
MEAN CORPUSCULAR HEMOGLOBIN: 30 pg (ref 26.6–33.0)
MEAN CORPUSCULAR VOLUME: 90 fL (ref 79–97)
MONOCYTES ABSOLUTE COUNT: 0.9 10*3/uL (ref 0.1–0.9)
MONOCYTES RELATIVE PERCENT: 9 %
NEUTROPHILS ABSOLUTE COUNT: 8.6 10*3/uL — ABNORMAL HIGH (ref 1.4–7.0)
NEUTROPHILS RELATIVE PERCENT: 84 %
PLATELET COUNT: 296 10*3/uL (ref 150–450)
RED CELL DISTRIBUTION WIDTH: 13.7 % (ref 11.7–15.4)

## 2019-10-30 LAB — MAGNESIUM: Magnesium:MCnc:Pt:Ser/Plas:Qn:: 1.6

## 2019-10-30 LAB — MEAN CORPUSCULAR HEMOGLOBIN: Erythrocyte mean corpuscular hemoglobin:EntMass:Pt:RBC:Qn:Automated count: 30

## 2019-10-30 LAB — LIPASE: Triacylglycerol lipase:CCnc:Pt:Ser/Plas:Qn:: 15

## 2019-10-30 LAB — BLOOD UREA NITROGEN: Urea nitrogen:MCnc:Pt:Ser/Plas:Qn:: 20

## 2019-10-30 LAB — AMYLASE: Amylase:CCnc:Pt:Ser/Plas:Qn:: 90

## 2019-11-01 DIAGNOSIS — Z9483 Pancreas transplant status: Principal | ICD-10-CM

## 2019-11-01 DIAGNOSIS — Z940 Kidney transplant status: Principal | ICD-10-CM

## 2019-11-01 DIAGNOSIS — D849 Immunodeficiency, unspecified: Principal | ICD-10-CM

## 2019-11-01 LAB — TACROLIMUS BLOOD: Tacrolimus:MCnc:Pt:Bld:Qn:LC/MS/MS: 6.5

## 2019-11-02 DIAGNOSIS — Z9483 Pancreas transplant status: Principal | ICD-10-CM

## 2019-11-02 DIAGNOSIS — Z940 Kidney transplant status: Principal | ICD-10-CM

## 2019-11-02 NOTE — Unmapped (Signed)
Pt request RX Refill sulfamethoxazole-trimethoprim (BACTRIM) 400-80 mg per tablet

## 2019-11-02 NOTE — Unmapped (Signed)
[facility] Refill Coordination Note    Specialty Medication(s) to be Shipped:   Transplant: Envarsus 1mg , Envarsus 4mg  and  mycophenolic acid 180mg     Other medication(s) to be shipped: Bactrim and sodium bicarb     Elaine Koch, DOB: Oct 30, 1985  Phone: [phone] (home)       All above HIPAA information was verified with patient.     Was a Nurse, learning disability used for this call? No    Completed refill call assessment today to schedule patient's medication shipment from the [facility] [phone]).       Specialty medication(s) and dose(s) confirmed: Envarsus dose change to 26mg  Daily   Changes to medications: [name] reports no changes at this time.  Changes to insurance: No  Questions for the pharmacist: No    Confirmed patient received Welcome Packet with first shipment. The patient will receive a drug information handout for each medication shipped and additional FDA Medication Guides as required.       DISEASE/MEDICATION-SPECIFIC INFORMATION        N/A    SPECIALTY MEDICATION ADHERENCE     Medication Adherence    Patient reported X missed doses in the last month: 0      Envarsus XR 1 mg: 8 days of medicine on hand   Envarsus XR 4 mg: 8 days of medicine on hand   Mycophenolate 180 mg: 10 days of medicine on hand   ??        SHIPPING     Shipping address confirmed in Epic.     Delivery Scheduled: Yes, Expected medication delivery date: 10/08/19.     Medication will be delivered via UPS to the prescription address in Epic [facility].    [place] A Keyondra Lagrand   [facility] Specialty Technician

## 2019-11-02 NOTE — Unmapped (Signed)
Pt Cr 1.72 and envarsus level 6.6 reviewed with Dr Carlene Coria. Pt advised to hydrate and repeat labs on Monday. Will follow up on repeat levels.

## 2019-11-03 MED ORDER — SULFAMETHOXAZOLE 400 MG-TRIMETHOPRIM 80 MG TABLET
ORAL_TABLET | ORAL | 2 refills | 28 days
Start: 2019-11-03 — End: 2020-01-31

## 2019-11-03 NOTE — Unmapped (Signed)
Patient called in to change delivery date to 9/24. States she will be out of medication on Saturday.

## 2019-11-04 DIAGNOSIS — Z940 Kidney transplant status: Principal | ICD-10-CM

## 2019-11-04 DIAGNOSIS — Z9483 Pancreas transplant status: Principal | ICD-10-CM

## 2019-11-04 MED FILL — SULFAMETHOXAZOLE 400 MG-TRIMETHOPRIM 80 MG TABLET: 28 days supply | Qty: 12 | Fill #0 | Status: AC

## 2019-11-04 MED FILL — SODIUM BICARBONATE 650 MG TABLET: ORAL | 30 days supply | Qty: 180 | Fill #3

## 2019-11-04 MED FILL — MYCOPHENOLATE SODIUM 180 MG TABLET,DELAYED RELEASE: 30 days supply | Qty: 120 | Fill #3 | Status: AC

## 2019-11-04 MED FILL — ENVARSUS XR 1 MG TABLET,EXTENDED RELEASE: 30 days supply | Qty: 60 | Fill #0 | Status: AC

## 2019-11-04 MED FILL — MYCOPHENOLATE SODIUM 180 MG TABLET,DELAYED RELEASE: ORAL | 30 days supply | Qty: 120 | Fill #3

## 2019-11-04 MED FILL — ENVARSUS XR 4 MG TABLET,EXTENDED RELEASE: 30 days supply | Qty: 180 | Fill #0 | Status: AC

## 2019-11-04 MED FILL — ENVARSUS XR 1 MG TABLET,EXTENDED RELEASE: ORAL | 30 days supply | Qty: 60 | Fill #0

## 2019-11-04 MED FILL — SODIUM BICARBONATE 650 MG TABLET: 30 days supply | Qty: 180 | Fill #3 | Status: AC

## 2019-11-05 ENCOUNTER — Ambulatory Visit
Admit: 2019-11-05 | Discharge: 2019-11-05 | Payer: MEDICARE | Attending: Infectious Disease | Primary: Infectious Disease

## 2019-11-05 ENCOUNTER — Ambulatory Visit: Admit: 2019-11-05 | Discharge: 2019-11-05 | Payer: MEDICARE

## 2019-11-05 DIAGNOSIS — A498 Other bacterial infections of unspecified site: Principal | ICD-10-CM

## 2019-11-05 LAB — RED BLOOD CELL COUNT: Lab: 3.85 — ABNORMAL LOW

## 2019-11-05 LAB — CBC W/ AUTO DIFF
BASOPHILS ABSOLUTE COUNT: 0 10*9/L (ref 0.0–0.1)
BASOPHILS RELATIVE PERCENT: 0.5 %
EOSINOPHILS RELATIVE PERCENT: 1.5 %
HEMATOCRIT: 36.2 % (ref 36.0–46.0)
HEMOGLOBIN: 11.4 g/dL — ABNORMAL LOW (ref 12.0–16.0)
LARGE UNSTAINED CELLS: 2 % (ref 0–4)
LYMPHOCYTES ABSOLUTE COUNT: 0.4 10*9/L — ABNORMAL LOW (ref 1.5–5.0)
LYMPHOCYTES RELATIVE PERCENT: 6.2 %
MEAN CORPUSCULAR HEMOGLOBIN CONC: 31.5 g/dL (ref 31.0–37.0)
MEAN CORPUSCULAR HEMOGLOBIN: 29.6 pg (ref 26.0–34.0)
MEAN PLATELET VOLUME: 8.1 fL (ref 7.0–10.0)
MONOCYTES ABSOLUTE COUNT: 0.4 10*9/L (ref 0.2–0.8)
MONOCYTES RELATIVE PERCENT: 5.6 %
NEUTROPHILS RELATIVE PERCENT: 84.3 %
PLATELET COUNT: 410 10*9/L (ref 150–440)
RED BLOOD CELL COUNT: 3.85 10*12/L — ABNORMAL LOW (ref 4.00–5.20)
RED CELL DISTRIBUTION WIDTH: 14.1 % (ref 12.0–15.0)
WBC ADJUSTED: 7 10*9/L (ref 4.5–11.0)

## 2019-11-05 LAB — COMPREHENSIVE METABOLIC PANEL
ALBUMIN: 3.4 g/dL (ref 3.4–5.0)
ALKALINE PHOSPHATASE: 86 U/L (ref 46–116)
ALT (SGPT): 25 U/L (ref 10–49)
ANION GAP: 5 mmol/L (ref 5–14)
AST (SGOT): 16 U/L (ref <=34)
BILIRUBIN TOTAL: 0.7 mg/dL (ref 0.3–1.2)
BLOOD UREA NITROGEN: 22 mg/dL (ref 9–23)
BUN / CREAT RATIO: 14
CALCIUM: 9.9 mg/dL (ref 8.7–10.4)
CHLORIDE: 106 mmol/L (ref 98–107)
CO2: 25 mmol/L (ref 20.0–31.0)
CREATININE: 1.61 mg/dL — ABNORMAL HIGH
EGFR CKD-EPI NON-AA FEMALE: 42 mL/min/{1.73_m2} — ABNORMAL LOW (ref >=60)
GLUCOSE RANDOM: 94 mg/dL (ref 70–99)
POTASSIUM: 4.5 mmol/L (ref 3.4–4.5)
PROTEIN TOTAL: 7.4 g/dL (ref 5.7–8.2)
SODIUM: 136 mmol/L (ref 135–145)

## 2019-11-05 LAB — PARATHYROID HORMONE INTACT: Parathyrin.intact:MCnc:Pt:Ser/Plas:Qn:: 94.7 — ABNORMAL HIGH

## 2019-11-05 LAB — TACROLIMUS, TROUGH: Lab: 9.2

## 2019-11-05 LAB — CO2: Carbon dioxide:SCnc:Pt:Ser/Plas:Qn:: 25

## 2019-11-05 LAB — AMYLASE: Amylase:CCnc:Pt:Ser/Plas:Qn:: 109

## 2019-11-05 LAB — PHOSPHORUS: Phosphate:MCnc:Pt:Ser/Plas:Qn:: 2.6

## 2019-11-05 LAB — MAGNESIUM
MAGNESIUM: 1.5 mg/dL — ABNORMAL LOW (ref 1.6–2.6)
Magnesium:MCnc:Pt:Ser/Plas:Qn:: 1.5 — ABNORMAL LOW

## 2019-11-05 LAB — LIPASE: Triacylglycerol lipase:CCnc:Pt:Ser/Plas:Qn:: 25

## 2019-11-05 MED ORDER — SULFAMETHOXAZOLE 400 MG-TRIMETHOPRIM 80 MG TABLET
ORAL_TABLET | ORAL | 2 refills | 28.00000 days | Status: CP
Start: 2019-11-05 — End: 2020-02-02
  Filled 2019-11-04: qty 12, 28d supply, fill #0

## 2019-11-05 NOTE — Unmapped (Signed)
IMMUNOCOMPROMISED HOST INFECTIOUS DISEASE PROGRESS NOTE    Assessment/Plan:     [name] is a 34 y.o. 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  Pertinent Co-morbidities  #T1DM  - diagnosed at age 41, c/b nephropathy and retinopathy  -?? 10/13/2019??A1C 4.3??  ??  #Sickle cell trait  ??  # Mild post-transplant CKD  Estimated Creatinine Clearance: 48.8 mL/min (A) (based on SCr of 1.72 mg/dL (H)).  ??  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  # E. faecalis UTI vs menstrual cramping (w/o bleeding) 10/01/19  - 8/20 fever T 38.3 but no dysuria, no suprapubic pain. Tender on LLQ over renal allograft  - 8/21 UA wbc 3; 8/21 UCx 50k-100k E. 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   Visit with Dr. Fara Boros in Oct  Finish HPV vacccine series 1 year after transplant  Flu vaccine in Oct    Follow up PRN    Subjective     Interval History:    I reviewed and summarized the medical records from hospitalization on 8/21-8/22 which illustrated LUQ and fever work-up. Work up unrevealing except for bacteriuria.   Reflux 2-3x per week esp with pizza. Stools are normal finally. No problem with the bezlo infusion. No dysuria. Still no menses but f/u with ob/gyn. Son is 4 and struggling with sinus disease.     Medications:  Antimicrobials: vancomycin daily po  Prior/Current immunomodulators: tac level 6.5, pred 5mg , MMF 360mg  bid  Other medications reviewed.    Objective     Vital signs:  BP 128/78  - Pulse 89  - Temp 36.1 ??C (97 ??F) (Tympanic)  - Ht 165.1 cm (5' 5)  - Wt 78.5 kg (173 lb)  - BMI 28.79 kg/m??     Physical Exam:  GEN:  looks well, no apparent distress  EYES: sclerae anicteric and non injected and PERRL, EOMI  ENT:no thrush, leukoplakia or oral lesions  CV:no peripheral edema  PULM:normal work of breathing at rest  GI:no tenderness over left renal allograph or right pancreas allograft and soft, NT  SKIN:no petechiae, ecchymoses or obvious rashes on clothed exam  NEURO:no tremor noted, facial expression symmetric and moves extremities equally  PSYCH:attentive, appropriate affect, good eye contact, fluent speech    Labs:  Lab Results   Component Value Date    WBC 10.2 10/29/2019    WBC 9.4 10/22/2019    WBC 5.2 10/13/2019    WBC 5.4 10/08/2019    HGB 11.0 (L) 10/29/2019    HCT 33.0 (L) 10/29/2019    Platelet 296 10/29/2019    Absolute Neutrophils 8.6 (H) 10/29/2019  Absolute Lymphocytes 0.6 (L) 10/29/2019    Absolute Eosinophils 0.1 10/29/2019    Sodium 139 10/29/2019    Potassium 4.7 10/29/2019    BUN 20 10/29/2019    Creatinine 1.72 (H) 10/29/2019    Creatinine 1.62 (H) 10/22/2019    Creatinine 1.36 (H) 10/13/2019    Creatinine 1.72 (H) 10/08/2019    Glucose 88 10/13/2019    Magnesium 1.6 10/29/2019    Albumin 3.5 10/13/2019    Total Bilirubin 0.5 10/13/2019    AST 23 10/13/2019    ALT 36 10/13/2019    Alkaline Phosphatase 70 10/13/2019    INR 1.23 10/02/2019   9/17 wbc nl; still very low ALC, Mild elevated Cr    Microbiology:  Past studies were reviewed in Epic  All ID studies sent during hospitalization were negative ex low VL CMV    Imaging:  None since hospitalization

## 2019-11-06 LAB — CMV DNA, QUANTITATIVE, PCR: CMV QUANT: 892 [IU]/mL — ABNORMAL HIGH (ref <0)

## 2019-11-06 LAB — CMV QUANT LOG10: Lab: 2.95 — ABNORMAL HIGH

## 2019-11-08 DIAGNOSIS — Z9483 Pancreas transplant status: Principal | ICD-10-CM

## 2019-11-08 DIAGNOSIS — Z940 Kidney transplant status: Principal | ICD-10-CM

## 2019-11-08 DIAGNOSIS — D849 Immunodeficiency, unspecified: Principal | ICD-10-CM

## 2019-11-08 NOTE — Unmapped (Signed)
Reviewed CMVPCR 842 with Vernona Rieger PharmD. Pt advised to repeat labs in 2 weeks due to CMV +PCR. Pt verbalize understanding.

## 2019-11-09 DIAGNOSIS — Z940 Kidney transplant status: Principal | ICD-10-CM

## 2019-11-09 DIAGNOSIS — Z9483 Pancreas transplant status: Principal | ICD-10-CM

## 2019-11-09 LAB — BK VIRUS QUANTITATIVE PCR, BLOOD: BK BLOOD RESULT: NOT DETECTED

## 2019-11-11 DIAGNOSIS — Z9483 Pancreas transplant status: Principal | ICD-10-CM

## 2019-11-11 DIAGNOSIS — Z940 Kidney transplant status: Principal | ICD-10-CM

## 2019-11-13 LAB — RENAL FUNCTION PANEL
ALBUMIN: 3.9 g/dL (ref 3.8–4.8)
BLOOD UREA NITROGEN: 23 mg/dL — ABNORMAL HIGH (ref 6–20)
BUN / CREAT RATIO: 13 (ref 9–23)
CHLORIDE: 103 mmol/L (ref 96–106)
CO2: 22 mmol/L (ref 20–29)
GFR MDRD AF AMER: 43 mL/min/{1.73_m2} — ABNORMAL LOW
GFR MDRD NON AF AMER: 37 mL/min/{1.73_m2} — ABNORMAL LOW
GLUCOSE: 93 mg/dL (ref 65–99)
PHOSPHORUS, SERUM: 2.7 mg/dL — ABNORMAL LOW (ref 3.0–4.3)
POTASSIUM: 5.1 mmol/L (ref 3.5–5.2)
SODIUM: 139 mmol/L (ref 134–144)

## 2019-11-13 LAB — CBC W/ DIFFERENTIAL
BANDED NEUTROPHILS ABSOLUTE COUNT: 0 10*3/uL (ref 0.0–0.1)
BASOPHILS ABSOLUTE COUNT: 0 10*3/uL (ref 0.0–0.2)
BASOPHILS RELATIVE PERCENT: 0 %
EOSINOPHILS ABSOLUTE COUNT: 0.1 10*3/uL (ref 0.0–0.4)
EOSINOPHILS RELATIVE PERCENT: 1 %
HEMATOCRIT: 32.9 % — ABNORMAL LOW (ref 34.0–46.6)
HEMOGLOBIN: 10.7 g/dL — ABNORMAL LOW (ref 11.1–15.9)
IMMATURE GRANULOCYTES: 0 %
LYMPHOCYTES ABSOLUTE COUNT: 0.6 10*3/uL — ABNORMAL LOW (ref 0.7–3.1)
MEAN CORPUSCULAR HEMOGLOBIN CONC: 32.5 g/dL (ref 31.5–35.7)
MEAN CORPUSCULAR HEMOGLOBIN: 30.1 pg (ref 26.6–33.0)
MEAN CORPUSCULAR VOLUME: 92 fL (ref 79–97)
MONOCYTES ABSOLUTE COUNT: 0.7 10*3/uL (ref 0.1–0.9)
MONOCYTES RELATIVE PERCENT: 7 %
NEUTROPHILS ABSOLUTE COUNT: 7.6 10*3/uL — ABNORMAL HIGH (ref 1.4–7.0)
NEUTROPHILS RELATIVE PERCENT: 85 %
RED BLOOD CELL COUNT: 3.56 x10E6/uL — ABNORMAL LOW (ref 3.77–5.28)
RED CELL DISTRIBUTION WIDTH: 13.1 % (ref 11.7–15.4)
WHITE BLOOD CELL COUNT: 9 10*3/uL (ref 3.4–10.8)

## 2019-11-13 LAB — AMYLASE
AMYLASE: 99 U/L (ref 31–110)
Amylase:CCnc:Pt:Ser/Plas:Qn:: 99

## 2019-11-13 LAB — BLOOD UREA NITROGEN: Urea nitrogen:MCnc:Pt:Ser/Plas:Qn:: 23 — ABNORMAL HIGH

## 2019-11-13 LAB — MAGNESIUM: Magnesium:MCnc:Pt:Ser/Plas:Qn:: 1.4 — ABNORMAL LOW

## 2019-11-13 LAB — LIPASE: Triacylglycerol lipase:CCnc:Pt:Ser/Plas:Qn:: 17

## 2019-11-13 LAB — MEAN CORPUSCULAR VOLUME: Erythrocyte mean corpuscular volume:EntVol:Pt:RBC:Qn:Automated count: 92

## 2019-11-14 LAB — TACROLIMUS BLOOD: Tacrolimus:MCnc:Pt:Bld:Qn:LC/MS/MS: 12.8

## 2019-11-15 DIAGNOSIS — Z940 Kidney transplant status: Principal | ICD-10-CM

## 2019-11-15 DIAGNOSIS — D849 Immunodeficiency, unspecified: Principal | ICD-10-CM

## 2019-11-15 DIAGNOSIS — Z9483 Pancreas transplant status: Principal | ICD-10-CM

## 2019-11-15 NOTE — Unmapped (Signed)
Envarus level 12.8 is a real trough. Labs reviewed with Dr Carlene Coria, patient advised to decrease envarsus to 24 mg daily and repeat labs in a week.

## 2019-11-16 DIAGNOSIS — Z940 Kidney transplant status: Principal | ICD-10-CM

## 2019-11-16 DIAGNOSIS — Z9483 Pancreas transplant status: Principal | ICD-10-CM

## 2019-11-18 DIAGNOSIS — Z940 Kidney transplant status: Principal | ICD-10-CM

## 2019-11-18 DIAGNOSIS — Z9483 Pancreas transplant status: Principal | ICD-10-CM

## 2019-11-22 DIAGNOSIS — Z9483 Pancreas transplant status: Principal | ICD-10-CM

## 2019-11-22 DIAGNOSIS — D849 Immunodeficiency, unspecified: Principal | ICD-10-CM

## 2019-11-22 DIAGNOSIS — E0929 Drug or chemical induced diabetes mellitus with other diabetic kidney complication: Principal | ICD-10-CM

## 2019-11-22 DIAGNOSIS — Z940 Kidney transplant status: Principal | ICD-10-CM

## 2019-11-23 DIAGNOSIS — Z940 Kidney transplant status: Principal | ICD-10-CM

## 2019-11-23 DIAGNOSIS — Z9483 Pancreas transplant status: Principal | ICD-10-CM

## 2019-11-25 DIAGNOSIS — Z9483 Pancreas transplant status: Principal | ICD-10-CM

## 2019-11-25 DIAGNOSIS — Z940 Kidney transplant status: Principal | ICD-10-CM

## 2019-11-26 ENCOUNTER — Ambulatory Visit: Admit: 2019-11-26 | Discharge: 2019-11-27 | Payer: MEDICARE | Attending: Nephrology | Primary: Nephrology

## 2019-11-26 DIAGNOSIS — Z940 Kidney transplant status: Principal | ICD-10-CM

## 2019-11-26 DIAGNOSIS — Z9483 Pancreas transplant status: Principal | ICD-10-CM

## 2019-11-26 DIAGNOSIS — Z79899 Other long term (current) drug therapy: Principal | ICD-10-CM

## 2019-11-26 DIAGNOSIS — E0929 Drug or chemical induced diabetes mellitus with other diabetic kidney complication: Principal | ICD-10-CM

## 2019-11-26 DIAGNOSIS — E559 Vitamin D deficiency, unspecified: Principal | ICD-10-CM

## 2019-11-26 LAB — CBC W/ AUTO DIFF
BASOPHILS ABSOLUTE COUNT: 0 10*9/L (ref 0.0–0.1)
EOSINOPHILS ABSOLUTE COUNT: 0.1 10*9/L (ref 0.0–0.7)
EOSINOPHILS RELATIVE PERCENT: 1.6 %
HEMATOCRIT: 32.2 % — ABNORMAL LOW (ref 35.0–44.0)
HEMOGLOBIN: 10.5 g/dL — ABNORMAL LOW (ref 12.0–15.5)
LYMPHOCYTES RELATIVE PERCENT: 5.1 %
MEAN CORPUSCULAR HEMOGLOBIN CONC: 32.5 g/dL (ref 30.0–36.0)
MEAN CORPUSCULAR HEMOGLOBIN: 29.1 pg (ref 26.0–34.0)
MEAN CORPUSCULAR VOLUME: 89.7 fL (ref 82.0–98.0)
MEAN PLATELET VOLUME: 7.5 fL (ref 7.0–10.0)
MONOCYTES ABSOLUTE COUNT: 0.8 10*9/L (ref 0.1–1.0)
MONOCYTES RELATIVE PERCENT: 9 %
NEUTROPHILS ABSOLUTE COUNT: 7.8 10*9/L — ABNORMAL HIGH (ref 1.7–7.7)
NEUTROPHILS RELATIVE PERCENT: 83.8 %
NUCLEATED RED BLOOD CELLS: 0 /100{WBCs} (ref <=4)
PLATELET COUNT: 367 10*9/L (ref 150–450)
RED BLOOD CELL COUNT: 3.59 10*12/L — ABNORMAL LOW (ref 3.90–5.03)
RED CELL DISTRIBUTION WIDTH: 13.9 % (ref 12.0–15.0)

## 2019-11-26 LAB — URINALYSIS
BILIRUBIN UA: NEGATIVE
BLOOD UA: NEGATIVE
GLUCOSE UA: NEGATIVE
KETONES UA: NEGATIVE
NITRITE UA: NEGATIVE
PH UA: 7.5 (ref 5.0–9.0)
PROTEIN UA: NEGATIVE
RBC UA: <1 /HPF (ref <4)
SPECIFIC GRAVITY UA: 1.015 (ref 1.005–1.030)
SQUAMOUS EPITHELIAL: 2 /HPF (ref 0–5)
WBC UA: 15 /HPF — ABNORMAL HIGH (ref 0–5)

## 2019-11-26 LAB — COMPREHENSIVE METABOLIC PANEL
ALT (SGPT): 26 U/L (ref 10–49)
ANION GAP: 5 mmol/L (ref 5–14)
AST (SGOT): 23 U/L (ref <=34)
BILIRUBIN TOTAL: 0.5 mg/dL (ref 0.3–1.2)
BLOOD UREA NITROGEN: 20 mg/dL (ref 9–23)
BUN / CREAT RATIO: 14
CALCIUM: 10.1 mg/dL (ref 8.7–10.4)
CHLORIDE: 109 mmol/L — ABNORMAL HIGH (ref 98–107)
CO2: 26.6 mmol/L (ref 20.0–31.0)
CREATININE: 1.43 mg/dL — ABNORMAL HIGH
EGFR CKD-EPI AA FEMALE: 56 mL/min/{1.73_m2} — ABNORMAL LOW (ref >=60)
EGFR CKD-EPI NON-AA FEMALE: 48 mL/min/{1.73_m2} — ABNORMAL LOW (ref >=60)
GLUCOSE RANDOM: 90 mg/dL (ref 70–99)
POTASSIUM: 4.1 mmol/L (ref 3.4–4.5)
PROTEIN TOTAL: 7.1 g/dL (ref 5.7–8.2)
SODIUM: 141 mmol/L (ref 135–145)

## 2019-11-26 LAB — EOSINOPHILS RELATIVE PERCENT: Eosinophils/100 leukocytes:NFr:Pt:Bld:Qn:Automated count: 1.6

## 2019-11-26 LAB — ANION GAP: Anion gap 3:SCnc:Pt:Ser/Plas:Qn:: 5

## 2019-11-26 LAB — PARATHYROID HORMONE INTACT: Parathyrin.intact:MCnc:Pt:Ser/Plas:Qn:: 106.6 — ABNORMAL HIGH

## 2019-11-26 LAB — BLOOD UA: Hemoglobin:PrThr:Pt:Urine:Ord:Test strip: NEGATIVE

## 2019-11-26 LAB — LIPASE: Triacylglycerol lipase:CCnc:Pt:Ser/Plas:Qn:: 21

## 2019-11-26 LAB — PHOSPHORUS: Phosphate:MCnc:Pt:Ser/Plas:Qn:: 3.3

## 2019-11-26 LAB — AMYLASE: Amylase:CCnc:Pt:Ser/Plas:Qn:: 152 — ABNORMAL HIGH

## 2019-11-26 LAB — MAGNESIUM: Magnesium:MCnc:Pt:Ser/Plas:Qn:: 1.5 — ABNORMAL LOW

## 2019-11-26 LAB — PROTEIN URINE: Protein:MCnc:Pt:Urine:Qn:: 14.5

## 2019-11-26 LAB — PROTEIN / CREATININE RATIO, URINE: PROTEIN URINE: 14.5 mg/dL

## 2019-11-26 MED ORDER — TACROLIMUS XR 4 MG TABLET,EXTENDED RELEASE 24 HR
ORAL_TABLET | Freq: Every day | ORAL | 3 refills | 90 days | Status: CP
Start: 2019-11-26 — End: 2020-11-25
  Filled 2019-12-01: qty 180, 30d supply, fill #0

## 2019-11-26 NOTE — Unmapped (Signed)
Patient labs drawn in room prior to leaving Nephrology clinic

## 2019-11-26 NOTE — Unmapped (Addendum)
Transplant Nephrology Clinic Visit      History of Present Illness    Elaine Koch is a 34 y.o. 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 She does not have evidence of donor specific antibodies as of 07/15/2019. Her most recent baseline creatinine is between 1.3-1.5, occasionally up to 1.7.    She was on hemodialysis for about 3 years prior to transplant through an AVF. Her diabetes has been complicated by retinopathy. She had pre-eclampsia during pregnancy in 2017. She also had severe anemia requiring multiple blood transfusions during pregnancy and for some time after. In spite of that she had a cPRA of 0% prior to transplant.    She was admitted from 3/21 through 05/11/19 for the initial transplant. Her course was fairly uncomplicated. On POD 8 did have a transient bump in her creatinine (1.39 to 1.57) and amylase (76 to 112), came down to 1.52 and 89 with fluids. [place] of both kidney and pancreas showed patent vessels and normal RIs. After discharge, she had some sugars in the 120s. On 4/2 reported BP 88/58 so amlodipine was held. At surgery visit 4/8 reported loose stools, which she had pre-transplant. Reglan was decreased. BP 80s/40s so all antihypertensives were held. At visit 4/15 pressures remained low so midodrine 5 mg daily was started, reglan further decreased. Her amylase was 467 at that visit so prednisone 5 mg was started since tacrolimus was sub-therapeutic, with plans to taper off once tac stable and pancreatic enzymes normal. At nephrology visit 4/20, amylase down to 179 and tacrolimus level better on TID dosing. Ureteral stent removed 06/10/19. At visit 5/25 planned transition to Envarsus given high tacrolimus requirement. Amylase remained elevated but stable at 149. Blood pressure improved and was no longer hypotensive.     She called the on-call coordinator on 07/11/19 complaining of headache, nausea and vomiting, diarrhea the night prior. No fever. Was able to keep morning meds down. Recommended bland diet, push fluids. On 6/1 called to report BP 143/110 with chest pain, headache and vomiting and was referred to ED. She was admitted from 6/1 through 07/16/19. On admission she was noted to have a creatinine of 3.22 (from 1.19 on 5/28) in the setting of a tacrolimus level of 27.9. Tacrolimus was held and level came down. She was hypotensive and given IVF. She was found to be C.diff positive and started on vancomycin with plans for 10 day course. Creatinine to 1.32 on discharge. Wbc 0.8 on discharge with ANC 0.5. CMV negative on 6/1. Was discharged on Myfortic 720 mg BID.    On 07/20/19 she was noted to have a wbc < 0.6 and ANC < 0.2. She reported no symptoms and was instructed to hold Myfortic and Valcyte on 6/10 and received a dose of Granix. On 6/10 wbc to 0.6 with ANC 0.3. On 6/16 she reported diarrhea returned after stopping vancomycin, course was extended for an additional 14 days. Wbc improved to 3.9 on 6/14 and then 6.1 on 6/17 so Myfortic restarted at 180 mg BID. Creatinine to 1.56 on 6/17 and 1.49 on 6/21, reported had not been drinking as much since back at work. Wbc 8.6 on 6/21 with ANC 7.5. Most recent lipase has been 90-122.    She followed up with ID on 08/31/19, who placed a referral to Dr. Fara Boros with GI for possible FMT. Patient finished fidaxomicin on 09/06/19 and restarted Vancomycin. When this occurred, patient's diarrhea returned. Patient's vancomycin stopped and she  restarted Fidaxomicin on 09/13/19. She completed her Bezlotoxumab on 09/17/19.     She was admitted from 8/20 through 10/03/19 after presenting with LLQ pain, myalgias, headaches, and low grade fevers. Urine grew 50-100K Enterococcus, no other source found, CT abd/pelvis, TTE and renal US all unremarkable. Treated with 10 day course of linezolid.    Interval since last visit 10/13/19    At her last visit had completed Difcid with improvement in diarrhea.    She saw [facility] on 11/05/19 with plans to continue the prolonged vancomycin taper.     She was seen in urgent care 11/17/19 with complaints of cough and rhinorrhea, son also with similar symptoms. COVID negative, supportive care recommended.    Creatinine has been running 1.6-1.8 associated with some elevated tacrolimus levels.    She presents today for follow up.     Her diarrhea has completely resolved. She is on vancomycin weekly at present.    She saw GYN locally for placement of an IUD, they also recommended Provera, but patient states Dr. Reynold Bowen had recommended against it.    She is back to work now, has a son aged 21.    She specifically denies fever, myalgias, upper respiratory/GI symptoms, or change in taste/smell.    Last dose of Envarsus: 10:00 am.    Review of Systems    Otherwise on review of systems patient denies fever or chills, chest pain, SOB, PND or orthopnea, lower extremity edema. Denies N/V/abdominal pain. No dysuria, hematuria or difficulty voiding. Bowel movements without blood. Denies joint pain or rash. All other systems are reviewed and are negative.    Medications    Current Outpatient Medications   Medication Sig Dispense Refill   ??? aspirin (ECOTRIN) 81 MG tablet Take 1 tablet (81 mg total) by mouth daily. 30 tablet 11   ??? biotin 5 mg tablet Take 1 tablet (5 mg total) by mouth daily. 30 tablet 11   ??? cholecalciferol, vitamin D3-125 mcg, 5,000 unit,, 125 mcg (5,000 unit) capsule Take 1 capsule (125 mcg total) by mouth daily. 100 capsule 10   ??? multivitamin, prenatal, folic acid-iron, 27-1 mg Tab Take 1 tablet by mouth daily.     ??? mycophenolate (MYFORTIC) 180 MG EC tablet Take 2 tablets (360 mg total) by mouth Two (2) times a day. 120 tablet 11   ??? predniSONE (DELTASONE) 5 MG tablet Take 1 tablet (5 mg total) by mouth daily. 30 tablet 11   ??? sodium bicarbonate 650 mg tablet Take 2 tablets (1,300 mg total) by mouth Three (3) times a day. 180 tablet 3   ??? tacrolimus (ENVARSUS XR) 4 mg Tb24 extended release tablet Take 6 tablets (24 mg total) by mouth daily. 540 tablet 3   ??? vancomycin (VANCOCIN) 125 MG capsule 4x/d for 14d, 2x/d for 7d, 1x/d for 7d, every 2d x 7d, every 3d x 14d (Patient taking differently: Take 125 mg by mouth once a week. 4x/d for 14d, 2x/d for 7d, 1x/d for 7d, every 2d x 7d, every 3d x 14d) 85 capsule 1     No current facility-administered medications for this visit.       Physical Exam    BP 130/80 (BP Site: L Arm, BP Position: Sitting, BP Cuff Size: Medium)  - Pulse 85  - Temp 35.7 ??C (96.2 ??F) (Temporal)  - Ht 165.1 cm (5' 5)  - Wt 78.8 kg (173 lb 12.8 oz)  - LMP 04/26/2019 (Approximate)  - BMI 28.92 kg/m??   General:  Patient is a pleasant female in no apparent distress.  Eyes: Sclera anicteric.  ENT: Mask in place.  Neck: Supple without LAD/JVD/bruits.  Lungs: Clear to auscultation bilaterally, no wheezes/rales/rhonchi.  Cardiovascular: Regular rate and rhythm without murmurs, rubs or gallops.  Abdomen: Soft, notender/nondistended. Positive bowel sounds. No tenderness over the graft.  Extremities: Without edema, joints without evidence of synovitis.  Skin: Without rash.  Neurological: Grossly nonfocal.  Psychiatric: Mood and affect appropriate.    Laboratory Results    Results for orders placed or performed in visit on 11/26/19   Urine Culture    Specimen: Clean Catch; Urine   Result Value Ref Range    Urine Culture, Comprehensive 10,000 to 50,000 CFU/mL Enterococcus faecalis (A)    HLA DSA Post Transplant   Result Value Ref Range    Donor ID [id number]     Donor HLA-A Antigen #1 A30     Anti-Donor HLA-A #1 MFI 8 <1000 MFI    Donor HLA-A Antigen #2 A68     Anti-Donor HLA-A #2 MFI 18 <1000 MFI    Donor HLA-B Antigen #1 B72     Anti-Donor HLA-B #1 MFI 0 <1000 MFI    Donor HLA-B Antigen #2 B42     Anti-Donor HLA-B #2 MFI 53 <1000 MFI    Donor HLA-C Antigen #1 C2     Anti-Donor HLA-C #1 MFI 330 <1000 MFI    Donor HLA-C Antigen #2 C17     Anti-Donor HLA-C #2 MFI 1226 (H) <1000 MFI    Donor HLA-DR Antigen #1 DR1     Anti-Donor HLA-DR #1 MFI 110 <1000 MFI    Donor HLA-DR Antigen #2 DR15     Anti-Donor HLA-DR #2 MFI 181 <1000 MFI    Donor DRw Antigen #2 DR51     Anti-Donor DRw #2 MFI 392 <1000 MFI    Donor HLA-DQB Antigen #1 DQ5     Anti-Donor HLA-DQB #1 MFI 0 <1000 MFI    Donor HLA-DQB Antigen #2 DQ6     Anti-Donor HLA-DQB #2 MFI 40 <1000 MFI    Donor HLA-DP Antigen #1 DPB02:01     Anti-Donor HLA-DP Ag #1 MFI 100 <1000 MFI    Donor HLA-DP Antigen #2 [id number]:01     DSA Comment DSA Cw17 identified pre-transplant.    CMV Quantitative PCR, Blood   Result Value Ref Range    CMV Viral Ld      CMV Quant 1,109 (H) <0 IU/mL    CMV Quant Log10 3.04 (H) <0.00 log IU/mL    CMV Comment     BK Virus Quantitative PCR, Blood   Result Value Ref Range    BK Blood Result Not Detected Not Detected    BK Blood Quant      BK Blood Log(10)      Bk Blood Comment     Urinalysis   Result Value Ref Range    Color, UA Yellow     Clarity, UA Clear     Specific Gravity, UA 1.015 1.005 - 1.030    pH, UA 7.5 5.0 - 9.0    Leukocyte Esterase, UA Trace (A) Negative    Nitrite, UA Negative Negative    Protein, UA Negative Negative    Glucose, UA Negative Negative    Ketones, UA Negative Negative    Urobilinogen, UA 0.2 mg/dL 0.2 - 2.0 mg/dL    Bilirubin, UA Negative Negative    Blood, UA Negative Negative    RBC, UA <1 <4 /HPF  WBC, UA 15 (H) 0 - 5 /HPF    WBC Clumps Rare (A) None Seen /HPF    Squam Epithel, UA 2 0 - 5 /HPF    Bacteria, UA Rare (A) None Seen /HPF   Protein/Creatinine Ratio, Urine   Result Value Ref Range    Creat U 79.7 Undefined mg/dL    Protein, Ur 16.1 mg/dL    Protein/Creatinine Ratio, Urine 0.182 Undefined   Parathyroid Hormone (PTH)   Result Value Ref Range    PTH 106.6 (H) 18.4 - 80.1 pg/mL   Vitamin D 25 Hydroxy (25OH D2 + D3)   Result Value Ref Range    Vitamin D Total (25OH) 53.8 20.0 - 80.0 ng/mL   Amylase Level   Result Value Ref Range    Amylase 152 (H) 30 - 118 U/L   Lipase Level   Result Value Ref Range Lipase 21 12 - 53 U/L   Tacrolimus Level, Trough   Result Value Ref Range    Tacrolimus, Trough 3.0 (L) 5.0 - 15.0 ng/mL   Magnesium Level   Result Value Ref Range    Magnesium 1.5 (L) 1.6 - 2.6 mg/dL   Phosphorus Level   Result Value Ref Range    Phosphorus 3.3 2.4 - 5.1 mg/dL   Comprehensive Metabolic Panel   Result Value Ref Range    Sodium 141 135 - 145 mmol/L    Potassium 4.1 3.4 - 4.5 mmol/L    Chloride 109 (H) 98 - 107 mmol/L    Anion Gap 5 5 - 14 mmol/L    CO2 26.6 20.0 - 31.0 mmol/L    BUN 20 9 - 23 mg/dL    Creatinine 0.96 (H) 0.55 - 1.02 mg/dL    BUN/Creatinine Ratio 14     EGFR CKD-EPI Non-African American, Female 48 (L) >=60 mL/min/1.22m2    EGFR CKD-EPI African American, Female 56 (L) >=60 mL/min/1.10m2    Glucose 90 70 - 99 mg/dL    Calcium 04.5 8.7 - 40.9 mg/dL    Albumin 3.5 3.4 - 5.0 g/dL    Total Protein 7.1 5.7 - 8.2 g/dL    Total Bilirubin 0.5 0.3 - 1.2 mg/dL    AST 23 <=81 U/L    ALT 26 10 - 49 U/L    Alkaline Phosphatase 79 46 - 116 U/L   FSAB Class 1 Antibody Specificity   Result Value Ref Range    HLA Class 1 Antibody Result Positive     HLA Class 1 Antibodies Identified Cw:4, 17     HLA Class 1 Antibody Comment     FSAB Class 2 Antibody Specificity   Result Value Ref Range    HLA Class 2 Antibody Result Positive     HLA Class 2 Antibodies Identified DPB1:1     HLA Class 2 Antibody Comment     Cytology - Urine   Result Value Ref Range    Final Diagnosis       Urine for decoy cells, voided  - No malignant cells identified  - No decoy cells identified    This electronic signature is attestation that the pathologist personally reviewed the submitted material(s) and the final diagnosis reflects that evaluation.      Gross Description            Urine clean catch voided decoy    50mL cloudy, yellow fluid, unfixed    Materials Prepared & Examined    Smear Slides.......0  Monolayers..........1  Cytospins.............0  Cell Blocks...........0  Core Biopsy.........0  Touch Prep..........0 Microscopic Description       Microscopic examination substantiates the above diagnosis.    Resident Physician: None Assigned      EMBEDDED IMAGES      Specimen Adequacy Satisfactory for evaluation     Disclaimer       Unless otherwise specified, specimens are preserved using 10% neutral buffered formalin. For cases in which immunohistochemical and/or in-situ hybridization stains are performed, the following statement applies: Appropriate controls for each stain (positive controls with or without negative controls) have been evaluated and stain as expected. These stains have not been separately validated for use on decalcified specimens and should be interpreted with caution in that setting. Some of the reagents used for these stains may be classified as analyte specific reagents (ASR). Tests using ASRs were developed, and their performance characteristics were determined, by the Anatomic Pathology [facility]). They have not been cleared or approved by the [place] Food and Drug Administration (FDA). The FDA does not require these tests to go through premarket FDA review. These tests are used for clinical purposes. They should not be regarded as investigational or for research. This laboratory is certified under the Clinical Laboratory Improvement Amendments (CLIA) as qualified to perform high complexity clinical laboratory testing.     CBC w/ Differential   Result Value Ref Range    WBC 9.3 3.5 - 10.5 10*9/L    RBC 3.59 (L) 3.90 - 5.03 10*12/L    HGB 10.5 (L) 12.0 - 15.5 g/dL    HCT 16.1 (L) 09.6 - 44.0 %    MCV 89.7 82.0 - 98.0 fL    MCH 29.1 26.0 - 34.0 pg    MCHC 32.5 30.0 - 36.0 g/dL    RDW 04.5 40.9 - 81.1 %    MPV 7.5 7.0 - 10.0 fL    Platelet 367 150 - 450 10*9/L    nRBC 0 <=4 /100 WBCs    Neutrophils % 83.8 %    Lymphocytes % 5.1 %    Monocytes % 9.0 %    Eosinophils % 1.6 %    Basophils % 0.5 %    Absolute Neutrophils 7.8 (H) 1.7 - 7.7 10*9/L    Absolute Lymphocytes 0.5 (L) 0.7 - 4.0 10*9/L    Absolute Monocytes 0.8 0.1 - 1.0 10*9/L    Absolute Eosinophils 0.1 0.0 - 0.7 10*9/L    Absolute Basophils 0.0 0.0 - 0.1 10*9/L       Assessment and Plan      1. Status post renal transplant. Her creatinine is 1.43 today and within her most recent baseline, has been elevated recently in the setting of elevated tacrolimus levels. Envarsus trough 3.0 today, and is a 26.5 hour trough. Below goal of 8-10, but not unreasonable given infections complications. Will continue current immunosuppression for now and follow up subsequent troughs.    2. Status post pancreas transplant. She has had a mildly elevated amylase since transplant, 152 today with normal lipase. Peaked at 467 on 05/27/19. On 10/08/19 amylase mildly elevated at 103 and lipase normal at 21. Hgb A1c reassuring at 4.3% on 10/13/19.    3. CMV viremia. Star Valcyte 900 mg BID, will decrease Myfortic to 180 mg BID.    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 Suspect due to decreased immunosuppression in the setting of infection. Will continue to follow.    5. History of hypertension. Blood pressure reasonable off meds, may consider adding ACEi/ARB  at some point given history of diabetes.    6. C.diff. Diarrhea improved, on prolonged vancomycin taper. Patient referred to Dr. Fara Boros with GI for possible FMT.    7. Childbearing age. She is aware that Myfortic is teratogenic. Has plans to place IUD locally.    8. Health maintenance. She received a flu shot in clinic today, 11/26/19. She has had a Pnemovax vaccine, needs Prevnar 13. Received both Covid vaccines and booster.     We discussed that while we continue to recommend the COVID vaccine for all eligible patients, we know that kidney transplant patients do not respond as strongly due to their immunosuppression, so she should continue to follow the CDC guidelines for patients who are not vaccinated. I would also encourage all household members and close contacts that are eligible for vaccination to get vaccinated. We discussed the importance of ongoing social distancing, wearing a mask outside the home, and hand washing/sanitizing.     9. Will see patient back in 8 weeks, or sooner if needed.     ADDENDUM: When his coordinator called on 03/01/19 to start the Valcyte, she reported return of watery diarrhea that was consistent with previous C.diff diarrhea. Dr. Reynold Bowen recommended restart Difcid and recheck stool sample.

## 2019-11-26 NOTE — Unmapped (Signed)
AOBP: Left arm medium  cuff   Average: 130/80 Pulse: 85  1st reading: 124/81 Pulse: 87  2nd reading: 137/80 Pulse: 84  3rd reading: 128/78 Pulse: 84

## 2019-11-27 LAB — CMV DNA, QUANTITATIVE, PCR: CMV QUANT: 1109 [IU]/mL — ABNORMAL HIGH (ref <0)

## 2019-11-27 LAB — CMV COMMENT: Lab: 0

## 2019-11-27 LAB — TACROLIMUS, TROUGH: Lab: 3 — ABNORMAL LOW

## 2019-11-28 NOTE — Unmapped (Signed)
Clinical Assessment Needed For: Dose Change  Medication: Envarsus XR  Last Fill Date/Day Supply: 11/05/2019 / 26 day supply  Copay $0.00  Was previous dose already scheduled to fill: No    Notes to Pharmacist:

## 2019-11-29 DIAGNOSIS — Z940 Kidney transplant status: Principal | ICD-10-CM

## 2019-11-29 DIAGNOSIS — A0472 Enterocolitis due to Clostridium difficile, not specified as recurrent: Principal | ICD-10-CM

## 2019-11-29 DIAGNOSIS — D849 Immunodeficiency, unspecified: Principal | ICD-10-CM

## 2019-11-29 DIAGNOSIS — Z9483 Pancreas transplant status: Principal | ICD-10-CM

## 2019-11-29 DIAGNOSIS — A0839 Other viral enteritis: Principal | ICD-10-CM

## 2019-11-29 DIAGNOSIS — B259 Cytomegaloviral disease, unspecified: Principal | ICD-10-CM

## 2019-11-29 MED ORDER — FIDAXOMICIN 200 MG TABLET
ORAL_TABLET | Freq: Two times a day (BID) | ORAL | 0 refills | 30 days | Status: CP
Start: 2019-11-29 — End: 2019-12-29

## 2019-11-29 MED ORDER — VALGANCICLOVIR 450 MG TABLET
ORAL_TABLET | Freq: Two times a day (BID) | ORAL | 11 refills | 30.00000 days | Status: CP
Start: 2019-11-29 — End: 2020-11-28
  Filled 2019-12-01: qty 120, 30d supply, fill #0

## 2019-11-29 MED ORDER — MYCOPHENOLATE SODIUM 180 MG TABLET,DELAYED RELEASE
ORAL_TABLET | Freq: Two times a day (BID) | ORAL | 11 refills | 30.00000 days | Status: CP
Start: 2019-11-29 — End: 2020-11-28
  Filled 2019-12-23: qty 60, 30d supply, fill #0

## 2019-11-29 NOTE — Unmapped (Signed)
Clinical Assessment Needed For: Dose Change  Medication: valganciclovir  Last Fill Date/Day Supply: 06/30/19 / 30  Copay $0  Was previous dose already scheduled to fill: No    Notes to Pharmacist: None

## 2019-11-29 NOTE — Unmapped (Addendum)
Pt report continuous C-diff diarrhea not relieved by vancomycin. Reviewed pt symptoms and CMVPCR 1109 with Darlen Round, Pt advised to start Dificid 200 mg BID, valcyte 900mg  BID and leave a stool sample to rule out CMV colitis vs Cdiff. Per Dr Carlene Coria advised to decrease myfortic to 180 mg BID.Pt verbalize nderstanding.

## 2019-11-29 NOTE — Unmapped (Signed)
One time [facility] order entered per coordinator

## 2019-11-30 ENCOUNTER — Institutional Professional Consult (permissible substitution)
Admit: 2019-11-30 | Discharge: 2019-12-01 | Payer: MEDICARE | Attending: Infectious Disease | Primary: Infectious Disease

## 2019-11-30 DIAGNOSIS — Z9483 Pancreas transplant status: Principal | ICD-10-CM

## 2019-11-30 DIAGNOSIS — Z940 Kidney transplant status: Principal | ICD-10-CM

## 2019-11-30 LAB — BK VIRUS QUANTITATIVE PCR, BLOOD

## 2019-11-30 LAB — VITAMIN D, TOTAL (25OH): Lab: 53.8

## 2019-11-30 LAB — BK BLOOD RESULT: Lab: NOT DETECTED

## 2019-11-30 NOTE — Unmapped (Addendum)
IMMUNOCOMPROMISED HOST INFECTIOUS DISEASE PROGRESS NOTE      I spent 15 minutes on the real-time audio and video with the patient on the date of service. I spent an additional 20 minutes on pre- and post-visit activities on the date of service.     The patient was physically located in [place] or a state in which I am permitted to provide care. The patient and/or parent/guardian understood that s/he may incur co-pays and cost sharing, and agreed to the telemedicine visit. The visit was reasonable and appropriate under the circumstances given the patient's presentation at the time.    The patient and/or parent/guardian has been advised of the potential risks and limitations of this mode of treatment (including, but not limited to, the absence of in-person examination) and has agreed to be treated using telemedicine. The patient's/patient's family's questions regarding telemedicine have been answered.     If the visit was completed in an ambulatory setting, the patient and/or parent/guardian has also been advised to contact their provider???s office for worsening conditions, and seek emergency medical treatment and/or call 911 if the patient deems either necessary.      Assessment/Plan:     [name] is a 34 y.o. 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  Pertinent Co-morbidities  #T1DM  - diagnosed at age 36, c/b nephropathy and retinopathy  -?? 10/13/2019??A1C 4.3??  ??  #Sickle cell trait  ??  # Mild post-transplant CKD  Estimated Creatinine Clearance: 58.6 mL/min (A) (based on SCr of 1.43 mg/dL (H)).    ??  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   ??  # E. faecalis UTI vs menstrual cramping (w/o bleeding) 10/01/19  - 8/20 fever T 38.3??but no dysuria, no suprapubic pain. Tender on LLQ over renal allograft  - 8/21 UA wbc 3; 8/21 UCx 50k-100k E. Faecalis (R-doxy, S-amp, nitrofurantoin)  - 8/21 unremarkable renal US (CT c/a/p, transvaginal US)  - s/p linezolid 600mg  BID PO x7days 8/22-8/28  ??  Recommendations:  -Her diarrhea worsened with decreased vanc dose and improved once she started to take vancomycin 125mg  4x daily, before she had a chance to start valcyte. Therefore, it seems that her diarrhea is more likely from C. Diff than CMV colitis.    -She can continue the Vanco 125mg  4x daily until she is able to start fidaxomicin 200mg  bid for 10 days then decrease to 200mg  daily for secondary ppx.   -F/u the pending C. Diff PCR   -the pt is to follow up with GI for possible fecal transplant evaluation next week.   -  the pt will start valcyte 900mg  bid (ordered on 11/29/19) but not yet arrived by mail, will get CMP, CBC, CMV PCR Q weekly (can get labs 1 week after starting valcyte)     Elaine Koch is currently receiving drug therapy requiring intensive lab monitoring for toxicity.    Follow up 3 weeks.     Attending attestation  I saw and evaluated the patient, participating in the key portions of the service.  I reviewed the resident???s note.  I agree with the resident???s findings and plan.   Ephraim Hamburger, MD    Subjective   Interval History:      On 11/27/19, she started to have abd cramping and diarrhea for 30 min. She had watery, non bloody diarrhea 3 x on Saturday.  On Sunday she had diarrhea 6 or 7x. She took PO vancomycin 125mg , 4 times daily on Sunday. Since last week she started to take vancomycin 125mg  twice a week from daily dosing. She missed about 2 days of PO vancomycin. Since taking vancomycin 4x, her diarrhea has improved. Today she had diarrhea 3x so far. The stools are watery and no blood.  No new foods, no new restaurants or recent travel. Her son does not have significant diarrhea. She has not obtained her valcyte or fidaxomicin. She has not been able to get C. Diff testing done yet.     Medications:  Antimicrobials:  Vancomycin QID 10/18    Prior/Current immunomodulators:  Cellcept   tacro  pred   Other medications reviewed.    Objective     Vital signs:  There were no vitals taken for this visit.    Physical Exam:  GEN:  looks well, no apparent distress  EYES: sclerae anicteric and non injected  ENT: no thrush   NEURO:no tremor noted and facial expression symmetric  PSYCH:attentive, appropriate affect, good eye contact, fluent speech    Labs:  Lab Results   Component Value Date    WBC 9.3 11/26/2019    WBC 9.0 11/12/2019    WBC 7.0 11/05/2019    WBC 10.2 10/29/2019    WBC 9.4 10/22/2019    HGB 10.5 (L) 11/26/2019    HGB 10.7 (L) 11/12/2019    HCT 32.2 (L) 11/26/2019    HCT 32.9 (L) 11/12/2019    Platelet 367 11/26/2019    Platelet 361 11/12/2019    Absolute Neutrophils 7.8 (H) 11/26/2019    Absolute Neutrophils 7.6 (H) 11/12/2019    Absolute Lymphocytes 0.5 (L) 11/26/2019    Absolute Lymphocytes 0.6 (L) 11/12/2019    Absolute Eosinophils 0.1 11/26/2019    Absolute Eosinophils 0.1 11/12/2019    Sodium 141 11/26/2019    Sodium 139 11/12/2019    Potassium 4.1 11/26/2019    Potassium 5.1 11/12/2019    BUN 20 11/26/2019    BUN 23 (H) 11/12/2019    Creatinine 1.43 (H) 11/26/2019    Creatinine 1.79 (H) 11/12/2019    Creatinine 1.61 (H) 11/05/2019    Creatinine 1.72 (H) 10/29/2019    Creatinine 1.62 (H) 10/22/2019    Glucose 90 11/26/2019    Magnesium 1.5 (L) 11/26/2019    Magnesium 1.4 (L) 11/12/2019    Albumin 3.5 11/26/2019    Total Bilirubin 0.5 11/26/2019    AST 23 11/26/2019    ALT 26 11/26/2019    Alkaline Phosphatase 79 11/26/2019    INR 1.23 10/02/2019       Microbiology:  Past cultures were reviewed in Epic and CareEverywhere.  Noted CMV VL 11/26/19 at 1190  11/26/19: urine cx E faecalis 10-50K    Imaging:  10/02/19: CT chest - No pneumonia.  Independent visualization of images: I independently reviewed the image from (date) and I agree with the findings/interpretation.  Answers for HPI/ROS submitted by the patient on 11/30/2019  Chronicity: recurrent  Onset: in the past 7 days  Onset quality: sudden  Frequency: 2 to 4 times per day  Episode duration: 1 days  Progression since onset: waxing and waning  Pain location: epigastric region  Pain - numeric: 6/10  Pain quality: aching  Radiates to: LLQ  anorexia: No  arthralgias: No  belching: Yes  constipation: No  diarrhea: Yes  dysuria: No  fever: No  flatus: No  frequency: No  headaches: No  hematochezia: No  hematuria: No  melena: No  myalgias: No  nausea: No  weight loss: No  vomiting: No  Aggravated by: nothing  Relieved by: being still

## 2019-11-30 NOTE — Unmapped (Signed)
Clinical Assessment Needed For: Dose Change  Medication: mycophenolate  Last Fill Date/Day Supply: 11/04/19 / 30  Copay $0  Was previous dose already scheduled to fill: No    Notes to Pharmacist: None

## 2019-11-30 NOTE — Unmapped (Signed)
[facility] Refill Coordination Note    Specialty Medication(s) to be Shipped:   Transplant: Envarsus 4mg  and valgancyclovir 450mg     Other medication(s) to be shipped: vitamin d, 5,000 unit capsules     **Patient denied refills for Envarsus 1mg  at this time**    Elaine Koch, DOB: September 02, 1985  Phone: [phone] (home)       All above HIPAA information was verified with patient.     Was a Nurse, learning disability used for this call? No    Completed refill call assessment today to schedule patient's medication shipment from the [facility] [phone]).       Specialty medication(s) and dose(s) confirmed: Patient reports changes to the regimen as follows: Valganciclovir 450mg  has increased to 2 tabs twice daily   Changes to medications: Nielle reports no changes at this time.  Changes to insurance: No  Questions for the pharmacist: No    Confirmed patient received Welcome Packet with first shipment. The patient will receive a drug information handout for each medication shipped and additional FDA Medication Guides as required.       DISEASE/MEDICATION-SPECIFIC INFORMATION        N/A    SPECIALTY MEDICATION ADHERENCE     Medication Adherence    Patient reported X missed doses in the last month: 0  Specialty Medication: Envarsus 4mg   Patient is on additional specialty medications: Yes  Additional Specialty Medications: Valganciclovir 450mg   Patient Reported Additional Medication X Missed Doses in the Last Month: 0  Patient is on more than two specialty medications: No  Informant: patient                Envarsus 4 mg: 7 days of medicine on hand   Valganciclovir 450 mg: 5 days of medicine on hand       SHIPPING     Shipping address confirmed in Epic.     Delivery Scheduled: Yes, Expected medication delivery date: 12/02/19.     Medication will be delivered via UPS to the prescription address in Epic WAM.    Elaine Koch   [facility] Specialty Technician

## 2019-12-01 MED FILL — VALGANCICLOVIR 450 MG TABLET: 30 days supply | Qty: 120 | Fill #0 | Status: AC

## 2019-12-01 MED FILL — CHOLECALCIFEROL (VITAMIN D3) 125 MCG (5,000 UNIT) CAPSULE: ORAL | 100 days supply | Qty: 100 | Fill #2

## 2019-12-01 MED FILL — ENVARSUS XR 4 MG TABLET,EXTENDED RELEASE: 30 days supply | Qty: 180 | Fill #0 | Status: AC

## 2019-12-01 MED FILL — CHOLECALCIFEROL (VITAMIN D3) 125 MCG (5,000 UNIT) CAPSULE: 100 days supply | Qty: 100 | Fill #2 | Status: AC

## 2019-12-01 NOTE — Unmapped (Signed)
Wanted to document some info regarding her history for upcoming visit with me which ID shared with me via staff message      Correct, she relapsed during the vancomycin taper. She has not previously be able to regain symptom control with QID vanc so she asked to start a 3rd course of fidaxomicin today - she may need to just stay on fidaxomicin daily until transplant. Her CMV PCR came back >1000 today so the story just got more complicated. Will see if she's still C diff PCR possible. Seems like we will wait to see how she is doing at your visit 10/29 on fidaxomicin and Valcyte.       ??   Previous Messages    ----- Message -----   From: Park Breed, MD   Subject: FMT ?? ?? ?? ?? ?? ?? ?? ?? ?? ?? ?? ?? ?? ?? ?? ?? ?? ?? ?? ?? ??     Is there a way to expedite FMT for Elaine Koch? She has had 2 course of fidaxomicin, bezlotoxumab, and an extremely slow vanc taper and she still contacted me this weekend that her C diff symptoms are back. She sees you on 10/29 but I'm wondering if we should bring to the ED to expedite?

## 2019-12-01 NOTE — Unmapped (Signed)
Envarsus evel 3.0 is not a real trough. Will follow up on repeat labs

## 2019-12-02 DIAGNOSIS — Z940 Kidney transplant status: Principal | ICD-10-CM

## 2019-12-02 DIAGNOSIS — Z9483 Pancreas transplant status: Principal | ICD-10-CM

## 2019-12-04 LAB — CBC W/ DIFFERENTIAL
BANDED NEUTROPHILS ABSOLUTE COUNT: 0 10*3/uL (ref 0.0–0.1)
BASOPHILS ABSOLUTE COUNT: 0 10*3/uL (ref 0.0–0.2)
BASOPHILS RELATIVE PERCENT: 1 %
EOSINOPHILS ABSOLUTE COUNT: 0.2 10*3/uL (ref 0.0–0.4)
EOSINOPHILS RELATIVE PERCENT: 2 %
HEMOGLOBIN: 10.1 g/dL — ABNORMAL LOW (ref 11.1–15.9)
IMMATURE GRANULOCYTES: 0 %
LYMPHOCYTES ABSOLUTE COUNT: 0.5 10*3/uL — ABNORMAL LOW (ref 0.7–3.1)
LYMPHOCYTES RELATIVE PERCENT: 7 %
MEAN CORPUSCULAR HEMOGLOBIN CONC: 32.3 g/dL (ref 31.5–35.7)
MEAN CORPUSCULAR HEMOGLOBIN: 29.4 pg (ref 26.6–33.0)
MEAN CORPUSCULAR VOLUME: 91 fL (ref 79–97)
MONOCYTES ABSOLUTE COUNT: 0.7 10*3/uL (ref 0.1–0.9)
MONOCYTES RELATIVE PERCENT: 10 %
NEUTROPHILS ABSOLUTE COUNT: 5.9 10*3/uL (ref 1.4–7.0)
NEUTROPHILS RELATIVE PERCENT: 80 %
PLATELET COUNT: 304 10*3/uL (ref 150–450)
RED BLOOD CELL COUNT: 3.43 x10E6/uL — ABNORMAL LOW (ref 3.77–5.28)
RED CELL DISTRIBUTION WIDTH: 13.7 % (ref 11.7–15.4)
WHITE BLOOD CELL COUNT: 7.3 10*3/uL (ref 3.4–10.8)

## 2019-12-04 LAB — RENAL FUNCTION PANEL
BLOOD UREA NITROGEN: 21 mg/dL — ABNORMAL HIGH (ref 6–20)
BUN / CREAT RATIO: 12 (ref 9–23)
CALCIUM: 9.2 mg/dL (ref 8.7–10.2)
CHLORIDE: 112 mmol/L — ABNORMAL HIGH (ref 96–106)
CO2: 21 mmol/L (ref 20–29)
GFR MDRD NON AF AMER: 39 mL/min/{1.73_m2} — ABNORMAL LOW
GLUCOSE: 86 mg/dL (ref 65–99)
PHOSPHORUS, SERUM: 3.5 mg/dL (ref 3.0–4.3)
POTASSIUM: 4.7 mmol/L (ref 3.5–5.2)
SODIUM: 145 mmol/L — ABNORMAL HIGH (ref 134–144)

## 2019-12-04 LAB — MAGNESIUM: Magnesium:MCnc:Pt:Ser/Plas:Qn:: 1.5 — ABNORMAL LOW

## 2019-12-04 LAB — LIPASE: Triacylglycerol lipase:CCnc:Pt:Ser/Plas:Qn:: 23

## 2019-12-04 LAB — CALCIUM: Calcium:MCnc:Pt:Ser/Plas:Qn:: 9.2

## 2019-12-04 LAB — RED BLOOD CELL COUNT: Erythrocytes:NCnc:Pt:Bld:Qn:Automated count: 3.43 — ABNORMAL LOW

## 2019-12-04 LAB — AMYLASE: Amylase:CCnc:Pt:Ser/Plas:Qn:: 144 — ABNORMAL HIGH

## 2019-12-06 DIAGNOSIS — D849 Immunodeficiency, unspecified: Principal | ICD-10-CM

## 2019-12-06 DIAGNOSIS — Z940 Kidney transplant status: Principal | ICD-10-CM

## 2019-12-06 DIAGNOSIS — Z9483 Pancreas transplant status: Principal | ICD-10-CM

## 2019-12-06 LAB — HLA DS POST TRANSPLANT
ANTI-DONOR DRW #2 MFI: 392 MFI
ANTI-DONOR HLA-A #1 MFI: 8 MFI
ANTI-DONOR HLA-A #2 MFI: 18 MFI
ANTI-DONOR HLA-B #1 MFI: 0 MFI
ANTI-DONOR HLA-B #2 MFI: 53 MFI
ANTI-DONOR HLA-C #1 MFI: 330 MFI
ANTI-DONOR HLA-DP AG #1 MFI: 100 MFI
ANTI-DONOR HLA-DQB #1 MFI: 0 MFI
ANTI-DONOR HLA-DQB #2 MFI: 40 MFI
ANTI-DONOR HLA-DR #1 MFI: 110 MFI
ANTI-DONOR HLA-DR #2 MFI: 181 MFI

## 2019-12-06 LAB — HLA CL1 ANTIBODY COMM: Lab: 0

## 2019-12-06 LAB — FSAB CLASS 2 ANTIBODY SPECIFICITY: HLA CL2 AB RESULT: POSITIVE

## 2019-12-06 LAB — HLA CL2 AB COMMENT: Lab: 0

## 2019-12-06 LAB — DONOR HLA-A ANTIGEN #2

## 2019-12-06 LAB — FSAB CLASS 1 ANTIBODY SPECIFICITY: HLA CLASS 1 ANTIBODY RESULT: POSITIVE

## 2019-12-06 NOTE — Unmapped (Signed)
Reviewed Cr 1.70 with patient. Advised to hydrate due to Cdiff diarrhea and repeat labs before her GI appointment on Friday 10/29. Patient verbalize understanding.

## 2019-12-07 DIAGNOSIS — Z940 Kidney transplant status: Principal | ICD-10-CM

## 2019-12-07 DIAGNOSIS — Z9483 Pancreas transplant status: Principal | ICD-10-CM

## 2019-12-07 LAB — TACROLIMUS BLOOD: Tacrolimus:MCnc:Pt:Bld:Qn:LC/MS/MS: 7

## 2019-12-09 DIAGNOSIS — Z9483 Pancreas transplant status: Principal | ICD-10-CM

## 2019-12-09 DIAGNOSIS — Z940 Kidney transplant status: Principal | ICD-10-CM

## 2019-12-10 ENCOUNTER — Ambulatory Visit: Admit: 2019-12-10 | Discharge: 2019-12-10 | Payer: MEDICARE

## 2019-12-10 DIAGNOSIS — A498 Other bacterial infections of unspecified site: Principal | ICD-10-CM

## 2019-12-10 DIAGNOSIS — Z9483 Pancreas transplant status: Principal | ICD-10-CM

## 2019-12-10 DIAGNOSIS — Z940 Kidney transplant status: Principal | ICD-10-CM

## 2019-12-10 DIAGNOSIS — B259 Cytomegaloviral disease, unspecified: Principal | ICD-10-CM

## 2019-12-10 LAB — COMPREHENSIVE METABOLIC PANEL
ALBUMIN: 3.6 g/dL (ref 3.4–5.0)
ALKALINE PHOSPHATASE: 73 U/L (ref 46–116)
ALT (SGPT): 36 U/L (ref 10–49)
ANION GAP: 4 mmol/L — ABNORMAL LOW (ref 5–14)
AST (SGOT): 25 U/L (ref <=34)
BILIRUBIN TOTAL: 0.7 mg/dL (ref 0.3–1.2)
BLOOD UREA NITROGEN: 31 mg/dL — ABNORMAL HIGH (ref 9–23)
BUN / CREAT RATIO: 18
CALCIUM: 10 mg/dL (ref 8.7–10.4)
CHLORIDE: 113 mmol/L — ABNORMAL HIGH (ref 98–107)
CO2: 25 mmol/L (ref 20.0–31.0)
CREATININE: 1.69 mg/dL — ABNORMAL HIGH
EGFR CKD-EPI AA FEMALE: 46 mL/min/{1.73_m2} — ABNORMAL LOW (ref >=60)
EGFR CKD-EPI NON-AA FEMALE: 40 mL/min/{1.73_m2} — ABNORMAL LOW (ref >=60)
GLUCOSE RANDOM: 90 mg/dL (ref 70–99)
POTASSIUM: 4.5 mmol/L (ref 3.4–4.5)
PROTEIN TOTAL: 7.4 g/dL (ref 5.7–8.2)
SODIUM: 142 mmol/L (ref 135–145)

## 2019-12-10 LAB — CBC W/ AUTO DIFF
BASOPHILS ABSOLUTE COUNT: 0.1 10*9/L (ref 0.0–0.1)
BASOPHILS RELATIVE PERCENT: 1.1 %
EOSINOPHILS ABSOLUTE COUNT: 0.1 10*9/L (ref 0.0–0.7)
EOSINOPHILS RELATIVE PERCENT: 2.7 %
HEMATOCRIT: 32.5 % — ABNORMAL LOW (ref 35.0–44.0)
HEMOGLOBIN: 10.7 g/dL — ABNORMAL LOW (ref 12.0–15.5)
LYMPHOCYTES ABSOLUTE COUNT: 0.5 10*9/L — ABNORMAL LOW (ref 0.7–4.0)
LYMPHOCYTES RELATIVE PERCENT: 10.5 %
MEAN CORPUSCULAR HEMOGLOBIN CONC: 32.9 g/dL (ref 30.0–36.0)
MEAN CORPUSCULAR HEMOGLOBIN: 29.5 pg (ref 26.0–34.0)
MEAN CORPUSCULAR VOLUME: 89.8 fL (ref 82.0–98.0)
MEAN PLATELET VOLUME: 7.2 fL (ref 7.0–10.0)
MONOCYTES ABSOLUTE COUNT: 0.1 10*9/L (ref 0.1–1.0)
MONOCYTES RELATIVE PERCENT: 2.2 %
NEUTROPHILS ABSOLUTE COUNT: 4.3 10*9/L (ref 1.7–7.7)
NEUTROPHILS RELATIVE PERCENT: 83.5 %
PLATELET COUNT: 339 10*9/L (ref 150–450)
RED BLOOD CELL COUNT: 3.62 10*12/L — ABNORMAL LOW (ref 3.90–5.03)
RED CELL DISTRIBUTION WIDTH: 14.3 % (ref 12.0–15.0)
WBC ADJUSTED: 5.1 10*9/L (ref 3.5–10.5)

## 2019-12-10 LAB — AMYLASE: AMYLASE: 169 U/L — ABNORMAL HIGH (ref 30–118)

## 2019-12-10 LAB — LIPASE: LIPASE: 28 U/L (ref 12–53)

## 2019-12-10 LAB — PHOSPHORUS: PHOSPHORUS: 3.3 mg/dL (ref 2.4–5.1)

## 2019-12-10 LAB — MAGNESIUM: MAGNESIUM: 1.6 mg/dL (ref 1.6–2.6)

## 2019-12-10 MED ORDER — FIDAXOMICIN 200 MG TABLET
ORAL_TABLET | Freq: Two times a day (BID) | ORAL | 2 refills | 30 days | Status: CP
Start: 2019-12-10 — End: 2020-01-09

## 2019-12-10 NOTE — Unmapped (Signed)
-   last dose of fidaxomicin will be three days prior to your procedure.  You will not restart these afterwards.  - please stop any probiotics at least two weeks prior to the procedure and do not restart these afterwards.  - You will take colonoscopy prep (to clean out the colon of all stool) as directed by the endoscopy schedulers.  You should receive those instructions.  - have your bathroom and kitchen hard surfaces cleaned with 1 part clorox: 10 parts water solution on the day of the procedure  - avoid antibiotics unless entirely necessary  - in the future case where antibiotics are needed for an infection, treat with the narrowest spectrum antibiotic that will treat the infection  - go on [email or web address] to read about the stool donor selection process  - we will plan to follow up in clinic 2 months after the fecal microbiota transplantation    My contact numbers  - You are welcome to call my nurse Ms. Yokoi's line and leave me a message at [phone]  -  If you ever need to call to make or reschedule a clinic appointment, pls call [phone] ext. [phone]  - If you ever need to call or reschedule an ENDOSCOPY appointment, that number is [phone].  - For records, etc. my fax number is: [phone]

## 2019-12-10 NOTE — Unmapped (Signed)
[facility] FACULTY GASTRO[facility]OLOGY CONSULTATION       REFERRING PROVIDER:    Park Breed, MD  [address]  [id number]  [place],  [place] [address]    PRIMARY CARE PROVIDER: Henderson Cloud, MD    ASSESSMENT:      [name] is a 34 y.o. 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 difficile infection that began after C. difficile was tested as part of a broad work-up following vomiting and headaches in June.   She has had improvement of diarrhea on the fidaxomicin and higher doses of vancomycin.      Although I think it is a bit unfortunate that she was tested for C. difficile in the first place when she did not have a clinical infection and then received broad-spectrum antibiotics, I do think this is clinically consistent with recurrent C. Difficile this point of fecal microbiota transplant would likely be the best route of care.   We discussed the benefits and risks of FMT including that it is highly effective therapy for recurrent C. Diff; that transmission of disease from donor to recipient is possible, that transient gastrointestinal symptoms such as diarrhea and nausea may occur; that it is considered an experimental therapy, and the alternative which would be further antibiotics.   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      PLAN:        - colonoscopy-based fecal microbiota transplantation with suppressive fidaxomicin until 3 days before transplant.  There are extensive patient instructions on the patient instructions tab    -rtc 4-5 months      Aleatha Borer, MD MSc  Associate Professor of Medicine, [facility]    Patient???s other care providers, please feel free to contact me via email ([email or web address]).    HPI: Elaine Koch is a 34 y.o. female seen in consultation at the request of Dr. Reynold Koch for evaluation of recurrent C. difficile infection.    Elaine Koch is a 34 year old woman with a history of type 1 diabetes, kidney failure, kidney and pancreas transplant on May 03, 2019 whom I see for C. difficile infection.    In June 2021, the patient presented with 4 days of nausea vomiting diarrhea and headaches. She was afebrile with leukopenia secondary to immunosuppressive's. Leukopenia worsened on 6/2. She was found to be C. difficile positive on admission. She was treated with oral vancomycin, with improvement in bowel movements.   To me, she actually did not remember having had diarrhea with that admission.    She reports that she took vancomycin. She was getting better but still had diarrhea. She remembers the diarrhea coming back in about 10-12 bowel movements. This cleared up with a round of fidaxomicin but not entirely so she restarted vancomycin taper. She was never retested after her initial test.    She had recurrence of diarrhea and was treated again with vancomycin. She then had a infusion of bezlotoxumab which she thought was concurrent with her diarrhea getting much better and her getting more formed and more regular bowel movements.    On October 01, 2019, she presented again to the ER with fever, headache, nausea vomiting left lower quadrant pain. She told me that she has had diarrhea for 2 days from prior to that admission. She did have a urine  culture growing 50-100,000 CFU's of Enterococcus ACM. She had normal CT imaging. She had an infectious work-up that was negative. She was discharged with 10 days of linezolid for her urine culture that was weakly positive for Enterococcus. During this admission she was on fidaxomicin.    1 week ago on October 20, patient had one diarrheal bowel movement that was very painful she then started having watery stools. ID put her back on fidaxomicin. She again did not have any stool test. Her diarrhea is now having 2-4 loose stools daily and having mild abdominal pain on suppressive fidaxomicin twice daily.    Studies/ prior workup reviewed:  December 16, 2019-complete blood count???white blood count 7.3, hemoglobin 10.1, platelets 304    C. difficile assays???07/14/2019???C. difficile assay was positive    CT abdomen and pelvis without contrast???moderate cardiomegaly, prominence of lower lobe pulmonary venous structures, hypodensity along the falciform ligament, question focal hepatic steatosis, trace ascites along the right lower quadrant pericolic gutter mild but advanced for age aorto iliac and atherosclerotic vascular disease, mildly enlarged uterus    12/01/2018???message from Dr. Cyril Koch has had 2 courses of fidaxomicin, bezlotoxumab, extremely slow bank taper and she contacted me on 12/01/2019 over the weekend that her C. difficile symptoms are back    Then follow up message- that patient relapsed during vancomycin taper, not previously been able to regain symptom control with 4 times a day vancomycin so she started third case of fidaxomicin on 12/01/2019???she may just need to stay on fidaxomicin daily until transplant.  Her CMV PCR came back over thousand today.  We will see if she still C. difficile PCR positive.    PAST MEDICAL HISTORY:  Past Medical History:   Diagnosis Date   ??? Chronic hypertension during pregnancy, antepartum 07/22/2015    Overview:  Methyldopa recommended per nephrologist if needed   ??? Diabetes mellitus type 1 (CMS-HCC)    ??? ESRD (end stage renal disease) (CMS-HCC)    ??? History of pre-eclampsia 10/24/2016   ??? 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 Stage 5 chronic kidney disease on chronic dialysis (CMS-HCC) 10/24/2016       PAST SURGICAL HISTORY:  Past Surgical History:   Procedure Laterality Date   ??? AV FISTULA PLACEMENT  2018   ??? CESAREAN SECTION     ??? PR TRANSPLANT ALLOGRAFT PANCREAS N/A 05/03/2019    Procedure: TRANSPLANTATION OF PANCREATIC ALLOGRAFT;  Surgeon: Leona Carry, MD;  Location: MAIN OR [facility];  Service: Transplant   ??? PR TRANSPLANT,PREP CADAVER RENAL GRAFT N/A 05/03/2019    Procedure: [facility] STD PREP CAD DONR RENAL ALLOGFT PRIOR TO TRNSPLNT, INCL DISSEC/REM PERINEPH FAT, DIAPH/RTPER ATTAC;  Surgeon: Leona Carry, MD;  Location: MAIN OR [facility];  Service: Transplant   ??? PR TRANSPLANT,PREP DONOR PANCREAS N/A 05/03/2019    Procedure: [facility] STANDARD PREPARATION OF CADAVER DONOR PANCREAS ALLOGRAFT PRIOR TO TRANSPLANTATION;  Surgeon: Leona Carry, MD;  Location: MAIN OR [facility];  Service: Transplant   ??? PR TRANSPLANTATION OF KIDNEY N/A 05/03/2019    Procedure: RENAL ALLOTRANSPLANTATION, IMPLANTATION OF GRAFT; WITHOUT RECIPIENT NEPHRECTOMY;  Surgeon: Leona Carry, MD;  Location: MAIN OR [facility];  Service: Transplant   ??? TONSILLECTOMY         MEDICATIONS:    Current Outpatient Medications:   ???  aspirin (ECOTRIN) 81 MG tablet, Take 1 tablet (81 mg total) by mouth daily., Disp: 30 tablet, Rfl: 11  ???  biotin 5 mg tablet, Take 1 tablet (5 mg  total) by mouth daily., Disp: 30 tablet, Rfl: 11  ???  cholecalciferol, vitamin D3-125 mcg, 5,000 unit,, 125 mcg (5,000 unit) capsule, Take 1 capsule (125 mcg total) by mouth daily., Disp: 100 capsule, Rfl: 10  ???  fidaxomicin (DIFICID) 200 mg tablet, Take 1 tablet (200 mg total) by mouth every twelve (12) hours., Disp: 60 tablet, Rfl: 0  ???  multivitamin, prenatal, folic acid-iron, 27-1 mg Tab, Take 1 tablet by mouth daily., Disp: , Rfl:   ???  mycophenolate (MYFORTIC) 180 MG EC tablet, Take 1 tablet (180 mg total) by mouth Two (2) times a day., Disp: 60 tablet, Rfl: 11  ???  predniSONE (DELTASONE) 5 MG tablet, Take 1 tablet (5 mg total) by mouth daily., Disp: 30 tablet, Rfl: 11  ???  sodium bicarbonate 650 mg tablet, Take 2 tablets (1,300 mg total) by mouth Three (3) times a day., Disp: 180 tablet, Rfl: 3  ???  tacrolimus (ENVARSUS XR) 4 mg Tb24 extended release tablet, Take 6 tablets (24 mg total) by mouth daily., Disp: 540 tablet, Rfl: 3  ???  valGANciclovir (VALCYTE) 450 mg tablet, Take 2 tablets (900 mg total) by mouth Two (2) times a day., Disp: 120 tablet, Rfl: 11    ALLERGIES:  Iodinated contrast media, Nickel, Propranolol, Eye irrigating solution [ophthalmic irrigation solution], Iodine, Naltrexone, and Uni-cortrom    SOCIAL HISTORY:  She did study at [facility]. She has a 28-year-old son. Her mother is her transplant support for the most part. She does work from home with [employer]. Does not smoke, occasionally takes alcohol socially    FAMILY HISTORY:  Family history of colorectal cancer, Crohn's or ulcerative colitis: No colon cancer    VITAL SIGNS AND PHYSICAL EXAM:  BP 138/87  - Pulse 87  - Temp 36.3 ??C (97.3 ??F)  - Wt 79.4 kg (175 lb)  - SpO2 98%  - BMI 29.12 kg/m??   Constitutional Pleasant, normal affect   Mental Status: Thought organized, appropriate affect, pleasantly interactive, not anxious appearing.    HEENT: No cervical lymphadenopathy, sclerae anicteric   Respiratory:  unlabored breathing.   Cardiac:  Euvolemic, regular rate    GI:  Soft, non-distended, non-tender, no organomegaly or masses. Well-healed scar in the mid abdomen   Perianal/Rectal Exam     Extremities: No edema, well perfused.    Musculoskeletal:  No joint swelling or tenderness noted, no deformities.   Skin:  No rashes, jaundice or skin lesions noted.   Neuro:  No focal deficits.

## 2019-12-11 LAB — TACROLIMUS LEVEL, TROUGH: TACROLIMUS, TROUGH: 10.3 ng/mL (ref 5.0–15.0)

## 2019-12-12 LAB — CMV QUANT LOG10: Lab: 2 — ABNORMAL HIGH

## 2019-12-12 LAB — CMV DNA, QUANTITATIVE, PCR: CMV QUANT LOG10: 2 {Log_IU}/mL — ABNORMAL HIGH (ref <0.00)

## 2019-12-13 DIAGNOSIS — Z940 Kidney transplant status: Principal | ICD-10-CM

## 2019-12-13 DIAGNOSIS — Z9483 Pancreas transplant status: Principal | ICD-10-CM

## 2019-12-13 DIAGNOSIS — D849 Immunodeficiency, unspecified: Principal | ICD-10-CM

## 2019-12-14 DIAGNOSIS — E10319 Type 1 diabetes mellitus with unspecified diabetic retinopathy without macular edema: Principal | ICD-10-CM

## 2019-12-14 DIAGNOSIS — R7881 Bacteremia: Principal | ICD-10-CM

## 2019-12-14 DIAGNOSIS — Z79899 Other long term (current) drug therapy: Principal | ICD-10-CM

## 2019-12-14 DIAGNOSIS — N179 Acute kidney failure, unspecified: Principal | ICD-10-CM

## 2019-12-14 DIAGNOSIS — Z7982 Long term (current) use of aspirin: Principal | ICD-10-CM

## 2019-12-14 DIAGNOSIS — E860 Dehydration: Principal | ICD-10-CM

## 2019-12-14 DIAGNOSIS — Z9483 Pancreas transplant status: Principal | ICD-10-CM

## 2019-12-14 DIAGNOSIS — N10 Acute tubulo-interstitial nephritis: Principal | ICD-10-CM

## 2019-12-14 DIAGNOSIS — T8619 Other complication of kidney transplant: Principal | ICD-10-CM

## 2019-12-14 DIAGNOSIS — Z7952 Long term (current) use of systemic steroids: Principal | ICD-10-CM

## 2019-12-14 DIAGNOSIS — B964 Proteus (mirabilis) (morganii) as the cause of diseases classified elsewhere: Principal | ICD-10-CM

## 2019-12-14 DIAGNOSIS — Z91041 Radiographic dye allergy status: Principal | ICD-10-CM

## 2019-12-14 DIAGNOSIS — Z9289 Personal history of other medical treatment: Principal | ICD-10-CM

## 2019-12-14 DIAGNOSIS — D573 Sickle-cell trait: Principal | ICD-10-CM

## 2019-12-14 DIAGNOSIS — Z888 Allergy status to other drugs, medicaments and biological substances status: Principal | ICD-10-CM

## 2019-12-14 DIAGNOSIS — U071 COVID-19: Principal | ICD-10-CM

## 2019-12-14 DIAGNOSIS — Z940 Kidney transplant status: Principal | ICD-10-CM

## 2019-12-14 DIAGNOSIS — A0471 Enterocolitis due to Clostridium difficile, recurrent: Principal | ICD-10-CM

## 2019-12-14 DIAGNOSIS — D849 Immunodeficiency, unspecified: Principal | ICD-10-CM

## 2019-12-14 MED ORDER — SODIUM BICARBONATE 650 MG TABLET
ORAL_TABLET | Freq: Three times a day (TID) | ORAL | 3 refills | 30 days | Status: CP
Start: 2019-12-14 — End: 2020-12-13
  Filled 2019-12-16: qty 4000, 1d supply, fill #0

## 2019-12-14 NOTE — Unmapped (Signed)
[facility] Refill Coordination Note    Specialty Medication(s) to be Shipped:   Transplant: mycophenolate mofetil 180mg     Other medication(s) to be shipped: aspirin and sodium bicarb     Elaine Koch, DOB: 09/03/1985  Phone: [phone] (home)       All above HIPAA information was verified with patient.     Was a Nurse, learning disability used for this call? No    Completed refill call assessment today to schedule patient's medication shipment from the [facility] [phone]).       Specialty medication(s) and dose(s) confirmed: Patient reports changes to the regimen as follows: mycophenolate - Take 1 tablet (180 mg total) by mouth Two (2) times a day. **new rx is on profile**   Changes to medications: [name] reports no changes at this time.  Changes to insurance: No  Questions for the pharmacist: No    Confirmed patient received Welcome Packet with first shipment. The patient will receive a drug information handout for each medication shipped and additional FDA Medication Guides as required.       DISEASE/MEDICATION-SPECIFIC INFORMATION        N/A    SPECIALTY MEDICATION ADHERENCE     Medication Adherence    Patient reported X missed doses in the last month: 0  Specialty Medication: Mycophenolate 180mg   Patient is on additional specialty medications: No        Mycophenolate 180 mg: 4 days of medicine on hand     SHIPPING     Shipping address confirmed in Epic.     Delivery Scheduled: Yes, Expected medication delivery date: 12/23/2019.     Medication will be delivered via UPS to the prescription address in Epic WAM.    Lorelei Pont [facility] Specialty Pharmacist

## 2019-12-14 NOTE — Unmapped (Signed)
Pt request for RX Refill sodium bicarbonate 650 mg tablet

## 2019-12-15 MED ORDER — PEG 3350-ELECTROLYTES 236 GRAM-22.74 GRAM-6.74 GRAM-5.86 GRAM SOLUTION
Freq: Once | ORAL | 0 refills | 0.00000 days | Status: CN
Start: 2019-12-15 — End: 2019-12-15

## 2019-12-16 DIAGNOSIS — Z9483 Pancreas transplant status: Principal | ICD-10-CM

## 2019-12-16 DIAGNOSIS — Z940 Kidney transplant status: Principal | ICD-10-CM

## 2019-12-16 MED FILL — PEG 3350-ELECTROLYTES 236 GRAM-22.74 GRAM-6.74 GRAM-5.86 GRAM SOLUTION: 1 days supply | Qty: 4000 | Fill #0 | Status: AC

## 2019-12-18 LAB — CBC W/ DIFFERENTIAL
BANDED NEUTROPHILS ABSOLUTE COUNT: 0 10*3/uL (ref 0.0–0.1)
BASOPHILS ABSOLUTE COUNT: 0 10*3/uL (ref 0.0–0.2)
BASOPHILS RELATIVE PERCENT: 0 %
EOSINOPHILS ABSOLUTE COUNT: 0.3 10*3/uL (ref 0.0–0.4)
EOSINOPHILS RELATIVE PERCENT: 5 %
HEMATOCRIT: 32.3 % — ABNORMAL LOW (ref 34.0–46.6)
IMMATURE GRANULOCYTES: 0 %
LYMPHOCYTES ABSOLUTE COUNT: 0.6 10*3/uL — ABNORMAL LOW (ref 0.7–3.1)
LYMPHOCYTES RELATIVE PERCENT: 9 %
MEAN CORPUSCULAR HEMOGLOBIN CONC: 31.9 g/dL (ref 31.5–35.7)
MEAN CORPUSCULAR HEMOGLOBIN: 29.1 pg (ref 26.6–33.0)
MEAN CORPUSCULAR VOLUME: 91 fL (ref 79–97)
MONOCYTES ABSOLUTE COUNT: 0.1 10*3/uL (ref 0.1–0.9)
NEUTROPHILS ABSOLUTE COUNT: 5.1 10*3/uL (ref 1.4–7.0)
PLATELET COUNT: 369 10*3/uL (ref 150–450)
RED CELL DISTRIBUTION WIDTH: 13.9 % (ref 11.7–15.4)
WHITE BLOOD CELL COUNT: 6.1 10*3/uL (ref 3.4–10.8)

## 2019-12-18 LAB — RENAL FUNCTION PANEL
ALBUMIN: 3.7 g/dL — ABNORMAL LOW (ref 3.8–4.8)
BLOOD UREA NITROGEN: 30 mg/dL — ABNORMAL HIGH (ref 6–20)
BUN / CREAT RATIO: 18 (ref 9–23)
CHLORIDE: 110 mmol/L — ABNORMAL HIGH (ref 96–106)
CO2: 21 mmol/L (ref 20–29)
GFR MDRD AF AMER: 46 mL/min/{1.73_m2} — ABNORMAL LOW
GFR MDRD NON AF AMER: 40 mL/min/{1.73_m2} — ABNORMAL LOW
GLUCOSE: 89 mg/dL (ref 65–99)
PHOSPHORUS, SERUM: 3 mg/dL (ref 3.0–4.3)
SODIUM: 142 mmol/L (ref 134–144)

## 2019-12-18 LAB — LIPASE: Triacylglycerol lipase:CCnc:Pt:Ser/Plas:Qn:: 18

## 2019-12-18 LAB — MAGNESIUM: Magnesium:MCnc:Pt:Ser/Plas:Qn:: 1.7

## 2019-12-18 LAB — ALBUMIN: Albumin:MCnc:Pt:Ser/Plas:Qn:: 3.7 — ABNORMAL LOW

## 2019-12-18 LAB — AMYLASE: Amylase:CCnc:Pt:Ser/Plas:Qn:: 191 — ABNORMAL HIGH

## 2019-12-18 LAB — EOSINOPHILS RELATIVE PERCENT: Eosinophils/100 leukocytes:NFr:Pt:Bld:Qn:Automated count: 5

## 2019-12-20 DIAGNOSIS — Z940 Kidney transplant status: Principal | ICD-10-CM

## 2019-12-20 DIAGNOSIS — Z9483 Pancreas transplant status: Principal | ICD-10-CM

## 2019-12-20 DIAGNOSIS — D849 Immunodeficiency, unspecified: Principal | ICD-10-CM

## 2019-12-21 DIAGNOSIS — Z940 Kidney transplant status: Principal | ICD-10-CM

## 2019-12-21 DIAGNOSIS — Z9483 Pancreas transplant status: Principal | ICD-10-CM

## 2019-12-22 LAB — TACROLIMUS BLOOD: Tacrolimus:MCnc:Pt:Bld:Qn:LC/MS/MS: 7.8

## 2019-12-23 DIAGNOSIS — Z940 Kidney transplant status: Principal | ICD-10-CM

## 2019-12-23 DIAGNOSIS — Z9483 Pancreas transplant status: Principal | ICD-10-CM

## 2019-12-23 MED FILL — SODIUM BICARBONATE 650 MG TABLET: ORAL | 30 days supply | Qty: 180 | Fill #0

## 2019-12-23 MED FILL — MYCOPHENOLATE SODIUM 180 MG TABLET,DELAYED RELEASE: 30 days supply | Qty: 60 | Fill #0 | Status: AC

## 2019-12-23 MED FILL — ASPIRIN 81 MG TABLET,DELAYED RELEASE: ORAL | 90 days supply | Qty: 90 | Fill #2

## 2019-12-23 MED FILL — SODIUM BICARBONATE 650 MG TABLET: 30 days supply | Qty: 180 | Fill #0 | Status: AC

## 2019-12-23 MED FILL — ASPIRIN 81 MG TABLET,DELAYED RELEASE: 90 days supply | Qty: 90 | Fill #2 | Status: AC

## 2019-12-23 NOTE — Unmapped (Signed)
[facility] Refill Coordination Note    Specialty Medication(s) to be Shipped:   Transplant: Envarsus 4mg  and valgancyclovir 450mg     Other medication(s) to be shipped: No additional medications requested for fill at this time     Elaine Koch, DOB: 03/24/1985  Phone: [phone] (home)       All above HIPAA information was verified with patient.     Was a Nurse, learning disability used for this call? No    Completed refill call assessment today to schedule patient's medication shipment from the [facility] [phone]).       Specialty medication(s) and dose(s) confirmed: Regimen is correct and unchanged.   Changes to medications: Meridith reports no changes at this time.  Changes to insurance: No  Questions for the pharmacist: No    Confirmed patient received Welcome Packet with first shipment. The patient will receive a drug information handout for each medication shipped and additional FDA Medication Guides as required.       DISEASE/MEDICATION-SPECIFIC INFORMATION        N/A    SPECIALTY MEDICATION ADHERENCE     Medication Adherence    Patient reported X missed doses in the last month: 0  Specialty Medication: Envarsus 4mg   Patient is on additional specialty medications: Yes  Additional Specialty Medications: Valganciclovir 450mg   Patient Reported Additional Medication X Missed Doses in the Last Month: 0  Patient is on more than two specialty medications: No        Envarsus 4 mg: 10 days of medicine on hand   Valganciclovir 450 mg: 10 days of medicine on hand     SHIPPING     Shipping address confirmed in Epic.     Delivery Scheduled: Yes, Expected medication delivery date: 12/31/2019.     Medication will be delivered via UPS to the prescription address in Epic WAM.    Lorelei Pont [facility] Specialty Technician

## 2019-12-24 ENCOUNTER — Institutional Professional Consult (permissible substitution)
Admit: 2019-12-24 | Discharge: 2019-12-25 | Payer: MEDICARE | Attending: Infectious Disease | Primary: Infectious Disease

## 2019-12-24 DIAGNOSIS — A498 Other bacterial infections of unspecified site: Principal | ICD-10-CM

## 2019-12-24 DIAGNOSIS — B259 Cytomegaloviral disease, unspecified: Principal | ICD-10-CM

## 2019-12-24 NOTE — Unmapped (Signed)
IMMUNOCOMPROMISED HOST INFECTIOUS DISEASE PROGRESS NOTE        I spent 15 minutes on the real-time audio and video visit with the patient on the date of service. I spent an additional 15 minutes on pre- and post-visit activities on the date of service.     The patient was not located and I was located within 250 yards of a hospital based location during the real-time audio and video visit. The patient was physically located in [place] or a state in which I am permitted to provide care. The patient and/or parent/guardian understood that s/he may incur co-pays and cost sharing, and agreed to the telemedicine visit. The visit was reasonable and appropriate under the circumstances given the patient's presentation at the time.    The patient and/or parent/guardian has been advised of the potential risks and limitations of this mode of treatment (including, but not limited to, the absence of in-person examination) and has agreed to be treated using telemedicine. The patient's/patient's family's questions regarding telemedicine have been answered.    If the visit was completed in an ambulatory setting, the patient and/or parent/guardian has also been advised to contact their provider???s office for worsening conditions, and seek emergency medical treatment and/or call 911 if the patient deems either necessary.    Assessment/Plan:     [name] is a 34 y.o. female who presents over video for CMV follow up.     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 10/13/2019??A1C 4.3??  ??  #Sickle cell trait  ??  # Mild post-transplant CKD  Estimated Creatinine Clearance: 50.4 mL/min (A) (based on SCr of 1.67 mg/dL (H)).    ??  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  # E. faecalis UTI vs menstrual cramping (w/o bleeding) 10/01/19  - 8/20 fever T 38.3??but no dysuria, no suprapubic pain. Tender on LLQ over renal allograft  - 8/21 UA wbc 3; 8/21 UCx 50k-100k E. Faecalis (R-doxy, S-amp, nitrofurantoin)  - 8/21 unremarkable renal US (CT c/a/p, transvaginal US)  - s/p linezolid 600mg  BID PO x7days 8/22-8/28  ??  Recommendations:  - Decrease Dificid tp 200mg  once daily for secondary ppx until Dec 31 (three days prior to FMT)  - Scheduled for FMTwith Dr. Fara Boros Feb 14, 2020  - Decrease Valcyte 900mg  qhs for secondary ppx until follow up in early Feb  - She appears to have suffficient refills at this time  - From my standpoint we are ok to drop cbc w diff and Cr to every 2-4 weeks for lab monitoring for drug toxicity.  - CMV PCR testing once more with next labs, then only as needed for symptoms/concern for  break-through CMV  - HPV9 at next in person visit; Moderna booster in March 2022    Follow up early February  Ephraim Hamburger, MD    Subjective   Interval History:    Diarrhea under control. No cramping. Little fever 99 2 days ago. Resolved with tylenol. Child is always sick. No complaints today.     Medications:  Antimicrobials:  fidax 200mg  bid  valcyte 900mg  bid    Prior/Current immunomodulators:  Myfortic 180mg   tacro 7.8 on 11/5  pred   Other medications reviewed.    Objective     Vital signs:  There were no vitals taken for this visit.    Physical Exam:  GEN:  looks well, no apparent distress  EYES: sclerae anicteric and non injected  NEURO:no tremor noted and facial expression symmetric  PSYCH:attentive, appropriate affect, good eye contact, fluent speech    Labs:  Lab Results   Component Value Date    WBC 6.1 12/17/2019    WBC 5.1 12/10/2019    WBC 7.3 12/03/2019    WBC 9.3 11/26/2019    WBC 9.0 11/12/2019    HGB 10.3 (L) 12/17/2019    HCT 32.3 (L) 12/17/2019    Platelet 369 12/17/2019    Absolute Neutrophils 5.1 12/17/2019    Absolute Lymphocytes 0.6 (L) 12/17/2019    Absolute Eosinophils 0.3 12/17/2019    Sodium 142 12/17/2019    Potassium 5.2 12/17/2019    BUN 30 (H) 12/17/2019    Creatinine 1.67 (H) 12/17/2019    Creatinine 1.69 (H) 12/10/2019    Creatinine 1.70 (H) 12/03/2019    Creatinine 1.43 (H) 11/26/2019    Creatinine 1.79 (H) 11/12/2019    Glucose 90 12/10/2019    Magnesium 1.7 12/17/2019    Albumin 3.6 12/10/2019    Total Bilirubin 0.7 12/10/2019    AST 25 12/10/2019    ALT 36 12/10/2019    Alkaline Phosphatase 73 12/10/2019    INR 1.23 10/02/2019   wbc stable on valcyte treatment  Cr stable  Tac 7.8    Microbiology:  Lab Results   Component Value Date    CMV Viral Ld Not Detected 08/06/2019    CMV Viral Ld Not Detected 07/13/2019    CMV Viral Ld Not Detected 06/01/2019    CMV Quant 100 (H) 12/10/2019    CMV Quant 1,109 (H) 11/26/2019    CMV Quant 892 (H) 11/05/2019     Imaging:  None new

## 2019-12-25 LAB — RENAL FUNCTION PANEL
ALBUMIN: 3.8 g/dL (ref 3.8–4.8)
BLOOD UREA NITROGEN: 23 mg/dL — ABNORMAL HIGH (ref 6–20)
BUN / CREAT RATIO: 11 (ref 9–23)
CALCIUM: 9.5 mg/dL (ref 8.7–10.2)
CHLORIDE: 108 mmol/L — ABNORMAL HIGH (ref 96–106)
CO2: 20 mmol/L (ref 20–29)
CREATININE: 2.15 mg/dL — ABNORMAL HIGH (ref 0.57–1.00)
GFR MDRD AF AMER: 34 mL/min/{1.73_m2} — ABNORMAL LOW
GFR MDRD NON AF AMER: 30 mL/min/{1.73_m2} — ABNORMAL LOW
GLUCOSE: 83 mg/dL (ref 65–99)
PHOSPHORUS, SERUM: 3.6 mg/dL (ref 3.0–4.3)
POTASSIUM: 4.6 mmol/L (ref 3.5–5.2)
SODIUM: 142 mmol/L (ref 134–144)

## 2019-12-25 LAB — CBC W/ DIFFERENTIAL
BANDED NEUTROPHILS ABSOLUTE COUNT: 0 10*3/uL (ref 0.0–0.1)
BASOPHILS ABSOLUTE COUNT: 0.1 10*3/uL (ref 0.0–0.2)
BASOPHILS RELATIVE PERCENT: 1 %
EOSINOPHILS ABSOLUTE COUNT: 0.2 10*3/uL (ref 0.0–0.4)
EOSINOPHILS RELATIVE PERCENT: 4 %
HEMATOCRIT: 29.1 % — ABNORMAL LOW (ref 34.0–46.6)
HEMOGLOBIN: 9.5 g/dL — ABNORMAL LOW (ref 11.1–15.9)
IMMATURE GRANULOCYTES: 0 %
LYMPHOCYTES ABSOLUTE COUNT: 0.5 10*3/uL — ABNORMAL LOW (ref 0.7–3.1)
LYMPHOCYTES RELATIVE PERCENT: 10 %
MEAN CORPUSCULAR HEMOGLOBIN CONC: 32.6 g/dL (ref 31.5–35.7)
MEAN CORPUSCULAR HEMOGLOBIN: 30.2 pg (ref 26.6–33.0)
MEAN CORPUSCULAR VOLUME: 92 fL (ref 79–97)
MONOCYTES ABSOLUTE COUNT: 0.1 10*3/uL (ref 0.1–0.9)
MONOCYTES RELATIVE PERCENT: 2 %
NEUTROPHILS ABSOLUTE COUNT: 3.8 10*3/uL (ref 1.4–7.0)
NEUTROPHILS RELATIVE PERCENT: 83 %
PLATELET COUNT: 311 10*3/uL (ref 150–450)
RED BLOOD CELL COUNT: 3.15 x10E6/uL — ABNORMAL LOW (ref 3.77–5.28)
RED CELL DISTRIBUTION WIDTH: 14.6 % (ref 11.7–15.4)
WHITE BLOOD CELL COUNT: 4.6 10*3/uL (ref 3.4–10.8)

## 2019-12-25 LAB — LIPASE: LIPASE: 15 U/L (ref 14–72)

## 2019-12-25 LAB — AMYLASE: AMYLASE: 87 U/L (ref 31–110)

## 2019-12-25 LAB — MAGNESIUM: MAGNESIUM: 1.5 mg/dL — ABNORMAL LOW (ref 1.6–2.3)

## 2019-12-27 DIAGNOSIS — Z9483 Pancreas transplant status: Principal | ICD-10-CM

## 2019-12-27 DIAGNOSIS — Z940 Kidney transplant status: Principal | ICD-10-CM

## 2019-12-27 DIAGNOSIS — D849 Immunodeficiency, unspecified: Principal | ICD-10-CM

## 2019-12-27 NOTE — Unmapped (Signed)
Reviewed Creatinine 2.15 with Dr Carlene Coria. Patient said she has a cold and has not been dinking well in addiiton to having a period for 2 week now and still going. I asked to reach out to her GYN becasue it is a long time. She will repeat labs Thursday. Biopsy schedule pending lab results

## 2019-12-29 LAB — TACROLIMUS LEVEL: TACROLIMUS BLOOD: 8 ng/mL (ref 2.0–20.0)

## 2019-12-30 MED FILL — ENVARSUS XR 4 MG TABLET,EXTENDED RELEASE: 30 days supply | Qty: 180 | Fill #1 | Status: AC

## 2019-12-30 MED FILL — VALGANCICLOVIR 450 MG TABLET: ORAL | 30 days supply | Qty: 120 | Fill #1

## 2019-12-30 MED FILL — VALGANCICLOVIR 450 MG TABLET: 30 days supply | Qty: 120 | Fill #1 | Status: AC

## 2019-12-30 MED FILL — ENVARSUS XR 4 MG TABLET,EXTENDED RELEASE: ORAL | 30 days supply | Qty: 180 | Fill #1

## 2019-12-31 LAB — CBC W/ DIFFERENTIAL
BANDED NEUTROPHILS ABSOLUTE COUNT: 0 10*3/uL (ref 0.0–0.1)
BASOPHILS ABSOLUTE COUNT: 0 10*3/uL (ref 0.0–0.2)
BASOPHILS RELATIVE PERCENT: 1 %
EOSINOPHILS ABSOLUTE COUNT: 0.1 10*3/uL (ref 0.0–0.4)
EOSINOPHILS RELATIVE PERCENT: 2 %
HEMATOCRIT: 29.2 % — ABNORMAL LOW (ref 34.0–46.6)
HEMOGLOBIN: 9.3 g/dL — ABNORMAL LOW (ref 11.1–15.9)
IMMATURE GRANULOCYTES: 0 %
LYMPHOCYTES ABSOLUTE COUNT: 0.5 10*3/uL — ABNORMAL LOW (ref 0.7–3.1)
LYMPHOCYTES RELATIVE PERCENT: 18 %
MEAN CORPUSCULAR HEMOGLOBIN CONC: 31.8 g/dL (ref 31.5–35.7)
MEAN CORPUSCULAR HEMOGLOBIN: 29.3 pg (ref 26.6–33.0)
MEAN CORPUSCULAR VOLUME: 92 fL (ref 79–97)
MONOCYTES ABSOLUTE COUNT: 0.1 10*3/uL (ref 0.1–0.9)
MONOCYTES RELATIVE PERCENT: 4 %
NEUTROPHILS ABSOLUTE COUNT: 2.1 10*3/uL (ref 1.4–7.0)
NEUTROPHILS RELATIVE PERCENT: 75 %
PLATELET COUNT: 403 10*3/uL (ref 150–450)
RED BLOOD CELL COUNT: 3.17 x10E6/uL — ABNORMAL LOW (ref 3.77–5.28)
RED CELL DISTRIBUTION WIDTH: 15 % (ref 11.7–15.4)
WHITE BLOOD CELL COUNT: 2.9 10*3/uL — ABNORMAL LOW (ref 3.4–10.8)

## 2019-12-31 LAB — RENAL FUNCTION PANEL
ALBUMIN: 3.7 g/dL — ABNORMAL LOW (ref 3.8–4.8)
BLOOD UREA NITROGEN: 21 mg/dL — ABNORMAL HIGH (ref 6–20)
BUN / CREAT RATIO: 13 (ref 9–23)
CALCIUM: 9.3 mg/dL (ref 8.7–10.2)
CHLORIDE: 108 mmol/L — ABNORMAL HIGH (ref 96–106)
CO2: 20 mmol/L (ref 20–29)
CREATININE: 1.63 mg/dL — ABNORMAL HIGH (ref 0.57–1.00)
GFR MDRD AF AMER: 48 mL/min/{1.73_m2} — ABNORMAL LOW
GFR MDRD NON AF AMER: 41 mL/min/{1.73_m2} — ABNORMAL LOW
GLUCOSE: 91 mg/dL (ref 65–99)
PHOSPHORUS, SERUM: 3.1 mg/dL (ref 3.0–4.3)
POTASSIUM: 5 mmol/L (ref 3.5–5.2)
SODIUM: 140 mmol/L (ref 134–144)

## 2019-12-31 LAB — MAGNESIUM: MAGNESIUM: 1.6 mg/dL (ref 1.6–2.3)

## 2019-12-31 LAB — LIPASE: LIPASE: 21 U/L (ref 14–72)

## 2019-12-31 LAB — AMYLASE: AMYLASE: 143 U/L — ABNORMAL HIGH (ref 31–110)

## 2020-01-01 LAB — CMV DNA, QUANTITATIVE, PCR: CMV QUANT: NEGATIVE [IU]/mL

## 2020-01-02 LAB — TACROLIMUS LEVEL: TACROLIMUS BLOOD: 10.3 ng/mL (ref 2.0–20.0)

## 2020-01-03 DIAGNOSIS — Z940 Kidney transplant status: Principal | ICD-10-CM

## 2020-01-03 DIAGNOSIS — D849 Immunodeficiency, unspecified: Principal | ICD-10-CM

## 2020-01-10 DIAGNOSIS — Z940 Kidney transplant status: Principal | ICD-10-CM

## 2020-01-10 DIAGNOSIS — D849 Immunodeficiency, unspecified: Principal | ICD-10-CM

## 2020-01-13 MED ORDER — DIFICID 200 MG TABLET
ORAL_TABLET | 2 refills | 0.00000 days
Start: 2020-01-13 — End: ?

## 2020-01-15 LAB — CBC W/ DIFFERENTIAL
BANDED NEUTROPHILS ABSOLUTE COUNT: 0 10*3/uL (ref 0.0–0.1)
BASOPHILS ABSOLUTE COUNT: 0.1 10*3/uL (ref 0.0–0.2)
BASOPHILS RELATIVE PERCENT: 1 %
EOSINOPHILS ABSOLUTE COUNT: 0.1 10*3/uL (ref 0.0–0.4)
EOSINOPHILS RELATIVE PERCENT: 1 %
HEMATOCRIT: 31.2 % — ABNORMAL LOW (ref 34.0–46.6)
HEMOGLOBIN: 10.1 g/dL — ABNORMAL LOW (ref 11.1–15.9)
IMMATURE GRANULOCYTES: 0 %
LYMPHOCYTES ABSOLUTE COUNT: 0.7 10*3/uL (ref 0.7–3.1)
LYMPHOCYTES RELATIVE PERCENT: 14 %
MEAN CORPUSCULAR HEMOGLOBIN CONC: 32.4 g/dL (ref 31.5–35.7)
MEAN CORPUSCULAR HEMOGLOBIN: 30.7 pg (ref 26.6–33.0)
MEAN CORPUSCULAR VOLUME: 95 fL (ref 79–97)
MONOCYTES ABSOLUTE COUNT: 0.3 10*3/uL (ref 0.1–0.9)
MONOCYTES RELATIVE PERCENT: 6 %
NEUTROPHILS ABSOLUTE COUNT: 4 10*3/uL (ref 1.4–7.0)
NEUTROPHILS RELATIVE PERCENT: 78 %
PLATELET COUNT: 315 10*3/uL (ref 150–450)
RED BLOOD CELL COUNT: 3.29 x10E6/uL — ABNORMAL LOW (ref 3.77–5.28)
RED CELL DISTRIBUTION WIDTH: 16.2 % — ABNORMAL HIGH (ref 11.7–15.4)
WHITE BLOOD CELL COUNT: 5.1 10*3/uL (ref 3.4–10.8)

## 2020-01-15 LAB — BASIC METABOLIC PANEL
BLOOD UREA NITROGEN: 28 mg/dL — ABNORMAL HIGH (ref 6–20)
BUN / CREAT RATIO: 16 (ref 9–23)
CALCIUM: 9.6 mg/dL (ref 8.7–10.2)
CHLORIDE: 112 mmol/L — ABNORMAL HIGH (ref 96–106)
CO2: 21 mmol/L (ref 20–29)
CREATININE: 1.78 mg/dL — ABNORMAL HIGH (ref 0.57–1.00)
GLUCOSE: 93 mg/dL (ref 65–99)
POTASSIUM: 5 mmol/L (ref 3.5–5.2)
SODIUM: 145 mmol/L — ABNORMAL HIGH (ref 134–144)

## 2020-01-15 LAB — MAGNESIUM: MAGNESIUM: 1.7 mg/dL (ref 1.6–2.3)

## 2020-01-15 LAB — PHOSPHORUS: PHOSPHORUS, SERUM: 3.7 mg/dL (ref 3.0–4.3)

## 2020-01-17 DIAGNOSIS — Z940 Kidney transplant status: Principal | ICD-10-CM

## 2020-01-17 DIAGNOSIS — Z9483 Pancreas transplant status: Principal | ICD-10-CM

## 2020-01-17 DIAGNOSIS — D849 Immunodeficiency, unspecified: Principal | ICD-10-CM

## 2020-01-17 LAB — CMV DNA, QUANTITATIVE, PCR: CMV QUANT: NEGATIVE [IU]/mL

## 2020-01-18 DIAGNOSIS — Z940 Kidney transplant status: Principal | ICD-10-CM

## 2020-01-18 LAB — TACROLIMUS LEVEL: TACROLIMUS BLOOD: 11.6 ng/mL (ref 2.0–20.0)

## 2020-01-18 MED ORDER — TACROLIMUS XR 4 MG TABLET,EXTENDED RELEASE 24 HR
ORAL_TABLET | Freq: Every day | ORAL | 3 refills | 108.00000 days | Status: CP
Start: 2020-01-18 — End: 2020-02-22
  Filled 2020-01-24: qty 30, 30d supply, fill #0

## 2020-01-18 MED ORDER — TACROLIMUS XR 1 MG TABLET,EXTENDED RELEASE 24 HR
ORAL_TABLET | Freq: Every day | ORAL | 11 refills | 30.00000 days | Status: CP
Start: 2020-01-18 — End: 2020-02-22

## 2020-01-18 NOTE — Unmapped (Signed)
Clinical Assessment Needed For: Dose Change  Medication: ENVARSUS 1MG   Last Fill Date/Day Supply: 11/05/2019 / 26 DAYS  Copay $0  Was previous dose already scheduled to fill: No    Notes to Pharmacist:       Clinical Assessment Needed For: Dose Change  Medication: ENVARSUS 4MG   Last Fill Date/Day Supply: 12/30/2019 / 30  Copay $0  Was previous dose already scheduled to fill: No    Notes to Pharmacist:

## 2020-01-18 NOTE — Unmapped (Signed)
Tacrolimus level 11.6 reviewed by Dr Carlene Coria. Patient advised to decrease envarsus to 21 mg daily. Will follow up on repeat level

## 2020-01-19 NOTE — Unmapped (Signed)
[facility] Refill Coordination Note  Medication: Envarsus, myfortic, valcyte    Unable to reach patient to schedule shipment for medication being filled at [facility]. Mailbox is full so unable to leave message.  As this is the 3rd unsuccessful attempt to reach the patient, no additional phone call attempts will be made at this time.      Phone numbers attempted: [phone]  Last scheduled delivery: 12/23/19    Please call the [facility] at [phone] (option [phone]) should you have any further questions.      Thanks,  [facility] Specialty Team

## 2020-01-21 NOTE — Unmapped (Signed)
[facility] Refill Coordination Note    Specialty Medication(s) to be Shipped:   Transplant: Envarsus 1mg , Envarsus 4mg ,  mycophenolic acid 180mg  and valgancyclovir 450mg     Other medication(s) to be shipped: No additional medications requested for fill at this time     Elaine Koch, DOB: 1985-11-12  Phone: [phone] (home)       All above HIPAA information was verified with patient.     Was a Nurse, learning disability used for this call? No    Completed refill call assessment today to schedule patient's medication shipment from the [facility] [phone]).       Specialty medication(s) and dose(s) confirmed: Regimen is correct and unchanged.   Changes to medications: Annalisa reports no changes at this time.  Changes to insurance: No  Questions for the pharmacist: No    Confirmed patient received Welcome Packet with first shipment. The patient will receive a drug information handout for each medication shipped and additional FDA Medication Guides as required.       DISEASE/MEDICATION-SPECIFIC INFORMATION        N/A    SPECIALTY MEDICATION ADHERENCE     Medication Adherence    Patient reported X missed doses in the last month: 0  Specialty Medication: Envarsus Xr 1mg   Patient is on additional specialty medications: Yes  Additional Specialty Medications: Envarsus Xr 4mg   Patient Reported Additional Medication X Missed Doses in the Last Month: 0  Patient is on more than two specialty medications: Yes  Specialty Medication: Mycophenolate 180mg   Patient Reported Additional Medication X Missed Doses in the Last Month: 0  Specialty Medication: Valganciclovir 450mg   Patient Reported Additional Medication X Missed Doses in the Last Month: 0                Envarsus Xr 1 mg: 7 days of medicine on hand   Envarsus Xr 4 mg: 7 days of medicine on hand   Mycophenolate 180 mg: 7 days of medicine on hand   Valganciclovir 450 mg: 7 days of medicine on hand        SHIPPING     Shipping address confirmed in Epic.     Delivery Scheduled: Yes, Expected medication delivery date: 01/25/20.     Medication will be delivered via UPS to the prescription address in Epic WAM.    Tera Helper   [facility] Specialty Pharmacist

## 2020-01-22 LAB — CBC W/ DIFFERENTIAL
BANDED NEUTROPHILS ABSOLUTE COUNT: 0 10*3/uL (ref 0.0–0.1)
BASOPHILS ABSOLUTE COUNT: 0 10*3/uL (ref 0.0–0.2)
BASOPHILS RELATIVE PERCENT: 1 %
EOSINOPHILS ABSOLUTE COUNT: 0.1 10*3/uL (ref 0.0–0.4)
EOSINOPHILS RELATIVE PERCENT: 1 %
HEMATOCRIT: 32.8 % — ABNORMAL LOW (ref 34.0–46.6)
HEMOGLOBIN: 10.9 g/dL — ABNORMAL LOW (ref 11.1–15.9)
IMMATURE GRANULOCYTES: 0 %
LYMPHOCYTES ABSOLUTE COUNT: 0.5 10*3/uL — ABNORMAL LOW (ref 0.7–3.1)
LYMPHOCYTES RELATIVE PERCENT: 11 %
MEAN CORPUSCULAR HEMOGLOBIN CONC: 33.2 g/dL (ref 31.5–35.7)
MEAN CORPUSCULAR HEMOGLOBIN: 31.7 pg (ref 26.6–33.0)
MEAN CORPUSCULAR VOLUME: 95 fL (ref 79–97)
MONOCYTES ABSOLUTE COUNT: 0.2 10*3/uL (ref 0.1–0.9)
MONOCYTES RELATIVE PERCENT: 5 %
NEUTROPHILS ABSOLUTE COUNT: 3.6 10*3/uL (ref 1.4–7.0)
NEUTROPHILS RELATIVE PERCENT: 82 %
PLATELET COUNT: 237 10*3/uL (ref 150–450)
RED BLOOD CELL COUNT: 3.44 x10E6/uL — ABNORMAL LOW (ref 3.77–5.28)
RED CELL DISTRIBUTION WIDTH: 17.2 % — ABNORMAL HIGH (ref 11.7–15.4)
WHITE BLOOD CELL COUNT: 4.3 10*3/uL (ref 3.4–10.8)

## 2020-01-22 LAB — BASIC METABOLIC PANEL
BLOOD UREA NITROGEN: 20 mg/dL (ref 6–20)
BUN / CREAT RATIO: 14 (ref 9–23)
CALCIUM: 9.4 mg/dL (ref 8.7–10.2)
CHLORIDE: 110 mmol/L — ABNORMAL HIGH (ref 96–106)
CO2: 17 mmol/L — ABNORMAL LOW (ref 20–29)
CREATININE: 1.45 mg/dL — ABNORMAL HIGH (ref 0.57–1.00)
GLUCOSE: 93 mg/dL (ref 65–99)
POTASSIUM: 4.4 mmol/L (ref 3.5–5.2)
SODIUM: 143 mmol/L (ref 134–144)

## 2020-01-22 LAB — MAGNESIUM: MAGNESIUM: 1.6 mg/dL (ref 1.6–2.3)

## 2020-01-22 LAB — PHOSPHORUS: PHOSPHORUS, SERUM: 2.8 mg/dL — ABNORMAL LOW (ref 3.0–4.3)

## 2020-01-23 MED ORDER — DIFICID 200 MG TABLET
ORAL_TABLET | 2 refills | 0 days
Start: 2020-01-23 — End: ?

## 2020-01-24 DIAGNOSIS — Z940 Kidney transplant status: Principal | ICD-10-CM

## 2020-01-24 DIAGNOSIS — D849 Immunodeficiency, unspecified: Principal | ICD-10-CM

## 2020-01-24 LAB — CMV DNA, QUANTITATIVE, PCR
CMV QUANT: NEGATIVE [IU]/mL
CMV QUANT: NEGATIVE [IU]/mL

## 2020-01-24 MED FILL — ENVARSUS XR 1 MG TABLET,EXTENDED RELEASE: 30 days supply | Qty: 30 | Fill #0 | Status: AC

## 2020-01-24 MED FILL — ENVARSUS XR 4 MG TABLET,EXTENDED RELEASE: ORAL | 30 days supply | Qty: 150 | Fill #0

## 2020-01-24 MED FILL — VALGANCICLOVIR 450 MG TABLET: ORAL | 30 days supply | Qty: 120 | Fill #2

## 2020-01-24 MED FILL — VALGANCICLOVIR 450 MG TABLET: 30 days supply | Qty: 120 | Fill #2 | Status: AC

## 2020-01-24 MED FILL — ENVARSUS XR 4 MG TABLET,EXTENDED RELEASE: 30 days supply | Qty: 150 | Fill #0 | Status: AC

## 2020-01-24 MED FILL — MYCOPHENOLATE SODIUM 180 MG TABLET,DELAYED RELEASE: ORAL | 30 days supply | Qty: 60 | Fill #1

## 2020-01-24 MED FILL — MYCOPHENOLATE SODIUM 180 MG TABLET,DELAYED RELEASE: 30 days supply | Qty: 60 | Fill #1 | Status: AC

## 2020-01-25 LAB — TACROLIMUS LEVEL: TACROLIMUS BLOOD: 5.6 ng/mL (ref 2.0–20.0)

## 2020-01-26 ENCOUNTER — Ambulatory Visit: Admit: 2020-01-26 | Discharge: 2020-01-27 | Payer: MEDICARE | Attending: Nephrology | Primary: Nephrology

## 2020-01-26 DIAGNOSIS — I10 Essential (primary) hypertension: Principal | ICD-10-CM

## 2020-01-26 DIAGNOSIS — E10319 Type 1 diabetes mellitus with unspecified diabetic retinopathy without macular edema: Principal | ICD-10-CM

## 2020-01-26 DIAGNOSIS — D849 Immunodeficiency, unspecified: Principal | ICD-10-CM

## 2020-01-26 DIAGNOSIS — Z940 Kidney transplant status: Principal | ICD-10-CM

## 2020-01-26 DIAGNOSIS — Z4822 Encounter for aftercare following kidney transplant: Principal | ICD-10-CM

## 2020-01-26 DIAGNOSIS — A0472 Enterocolitis due to Clostridium difficile, not specified as recurrent: Principal | ICD-10-CM

## 2020-01-26 DIAGNOSIS — B349 Viral infection, unspecified: Principal | ICD-10-CM

## 2020-01-26 DIAGNOSIS — Z79899 Other long term (current) drug therapy: Principal | ICD-10-CM

## 2020-01-26 DIAGNOSIS — Z7982 Long term (current) use of aspirin: Principal | ICD-10-CM

## 2020-01-26 DIAGNOSIS — Z9483 Pancreas transplant status: Principal | ICD-10-CM

## 2020-01-26 LAB — PROTEIN / CREATININE RATIO, URINE
CREATININE, URINE: 87.9 mg/dL
PROTEIN URINE: 6.9 mg/dL
PROTEIN/CREAT RATIO, URINE: 0.078

## 2020-01-26 LAB — URINALYSIS
BILIRUBIN UA: NEGATIVE
BLOOD UA: NEGATIVE
GLUCOSE UA: NEGATIVE
KETONES UA: NEGATIVE
LEUKOCYTE ESTERASE UA: NEGATIVE
NITRITE UA: NEGATIVE
PH UA: 6 (ref 5.0–9.0)
PROTEIN UA: NEGATIVE
RBC UA: <1 /HPF (ref <4)
SPECIFIC GRAVITY UA: 1.015 (ref 1.005–1.030)
SQUAMOUS EPITHELIAL: 5 /HPF (ref 0–5)
UROBILINOGEN UA: 0.2
WBC UA: 2 /HPF (ref 0–5)

## 2020-01-26 MED ORDER — MYCOPHENOLATE SODIUM 180 MG TABLET,DELAYED RELEASE
ORAL_TABLET | Freq: Two times a day (BID) | ORAL | 11 refills | 30.00000 days | Status: CP
Start: 2020-01-26 — End: 2020-02-22

## 2020-01-26 NOTE — Unmapped (Signed)
Transplant Nephrology Clinic Visit      History of Present Illness    Elaine Koch is a 34 y.o. 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 She does not have evidence of donor specific antibodies as of 07/15/2019. Her most recent baseline creatinine is between 1.3-1.5, occasionally up to 1.7.    She was on hemodialysis for about 3 years prior to transplant through an AVF. Her diabetes has been complicated by retinopathy. She had pre-eclampsia during pregnancy in 2017. She also had severe anemia requiring multiple blood transfusions during pregnancy and for some time after. In spite of that she had a cPRA of 0% prior to transplant.    She was admitted from 3/21 through 05/11/19 for the initial transplant. Her course was fairly uncomplicated. On POD 8 did have a transient bump in her creatinine (1.39 to 1.57) and amylase (76 to 112), came down to 1.52 and 89 with fluids. [place] of both kidney and pancreas showed patent vessels and normal RIs. After discharge, she had some sugars in the 120s. On 4/2 reported BP 88/58 so amlodipine was held. At surgery visit 4/8 reported loose stools, which she had pre-transplant. Reglan was decreased. BP 80s/40s so all antihypertensives were held. At visit 4/15 pressures remained low so midodrine 5 mg daily was started, reglan further decreased. Her amylase was 467 at that visit so prednisone 5 mg was started since tacrolimus was sub-therapeutic, with plans to taper off once tac stable and pancreatic enzymes normal. At nephrology visit 4/20, amylase down to 179 and tacrolimus level better on TID dosing. Ureteral stent removed 06/10/19. At visit 5/25 planned transition to Envarsus given high tacrolimus requirement. Amylase remained elevated but stable at 149. Blood pressure improved and was no longer hypotensive.     She called the on-call coordinator on 07/11/19 complaining of headache, nausea and vomiting, diarrhea the night prior. No fever. Was able to keep morning meds down. Recommended bland diet, push fluids. On 6/1 called to report BP 143/110 with chest pain, headache and vomiting and was referred to ED. She was admitted from 6/1 through 07/16/19. On admission she was noted to have a creatinine of 3.22 (from 1.19 on 5/28) in the setting of a tacrolimus level of 27.9. Tacrolimus was held and level came down. She was hypotensive and given IVF. She was found to be C.diff positive and started on vancomycin with plans for 10 day course. Creatinine to 1.32 on discharge. Wbc 0.8 on discharge with ANC 0.5. CMV negative on 6/1. Was discharged on Myfortic 720 mg BID.    On 07/20/19 she was noted to have a wbc < 0.6 and ANC < 0.2. She reported no symptoms and was instructed to hold Myfortic and Valcyte on 6/10 and received a dose of Granix. On 6/10 wbc to 0.6 with ANC 0.3. On 6/16 she reported diarrhea returned after stopping vancomycin, course was extended for an additional 14 days. Wbc improved to 3.9 on 6/14 and then 6.1 on 6/17 so Myfortic restarted at 180 mg BID. Creatinine to 1.56 on 6/17 and 1.49 on 6/21, reported had not been drinking as much since back at work. Wbc 8.6 on 6/21 with ANC 7.5. Most recent lipase has been 90-122.    She followed up with ID on 08/31/19, who placed a referral to Dr. Fara Boros with GI for possible FMT. Patient finished fidaxomicin on 09/06/19 and restarted Vancomycin. When this occurred, patient's diarrhea returned. Patient's vancomycin stopped and she  restarted Fidaxomicin on 09/13/19. She completed her Bezlotoxumab on 09/17/19.     She was admitted from 8/20 through 10/03/19 after presenting with LLQ pain, myalgias, headaches, and low grade fevers. Urine grew 50-100K Enterococcus, no other source found, CT abd/pelvis, TTE and renal US all unremarkable. Treated with 10 day course of linezolid.    Interval since last visit 11/26/19    Since last visit, she had a virtual visit with ID on 11/30/19 for diarrhea.  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 Per note She can continue the Vanco 125mg  4x daily until she is able to start fidaxomicin 200mg  bid for 10 days then decrease to 200mg  daily for secondary ppx.  Patient saw GI on 12/10/19 with a plan for fecal transplant, which is scheduled for 02/14/2020.  She followed up with ID on 12/24/19 who stated to Decrease Dificid tp 200mg  once daily for secondary ppx until Dec 31 (three days prior to FMT) and to Decrease Valcyte 900mg  qhs for secondary ppx until follow up in early Feb.     She presents today for follow up. Home BP running 125/80's.  She endorses occasional headaches that resolve with Tylenol. She is currently on Vancomycin TID as her pharmacy was out of Dificid. She is having about 3-4 episodes of diarrhea a day since starting vancomycin. Per patient she has less diarrhea with Dificid.  She was unable to get an IUD locally as she was informed her insurance would not cover it.     She specifically denies fever, myalgias, upper respiratory/GI symptoms, or change in taste/smell.      Review of Systems    Otherwise on review of systems patient denies fever or chills, chest pain, SOB, PND or orthopnea, lower extremity edema. Denies N/V/abdominal pain. No dysuria, hematuria or difficulty voiding. Bowel movements without blood. Denies joint pain or rash. All other systems are reviewed and are negative.    Medications    Current Outpatient Medications   Medication Sig Dispense Refill   ??? aspirin (ECOTRIN) 81 MG tablet Take 1 tablet (81 mg total) by mouth daily. 30 tablet 11   ??? biotin 5 mg tablet Take 1 tablet (5 mg total) by mouth daily. 30 tablet 11   ??? cholecalciferol, vitamin D3-125 mcg, 5,000 unit,, 125 mcg (5,000 unit) capsule Take 1 capsule (125 mcg total) by mouth daily. 100 capsule 10   ??? multivitamin, prenatal, folic acid-iron, 27-1 mg Tab Take 1 tablet by mouth daily.     ??? mycophenolate (MYFORTIC) 180 MG EC tablet Take 2 tablets (360 mg total) by mouth Two (2) times a day. 120 tablet 11   ??? predniSONE (DELTASONE) 5 MG tablet Take 1 tablet (5 mg total) by mouth daily. 30 tablet 11   ??? sodium bicarbonate 650 mg tablet Take 2 tablets (1,300 mg total) by mouth Three (3) times a day. 180 tablet 3   ??? tacrolimus (ENVARSUS XR) 1 mg Tb24 extended release tablet Take 1 tablet (1 mg total) by mouth daily with 5 (4 mg) tablets for daily dose of 21 mg total 30 tablet 11   ??? tacrolimus (ENVARSUS XR) 4 mg Tb24 extended release tablet Take 5 tablets (20 mg total) by mouth daily with 1 (1 mg) tablet for daily dose of 21 mg total 540 tablet 3   ??? valGANciclovir (VALCYTE) 450 mg tablet Take 2 tablets (900 mg total) by mouth Two (2) times a day. 120 tablet 11   ??? fidaxomicin (DIFICID) 200  mg tablet Take 1 tablet (200 mg total) by mouth every twelve (12) hours. (Patient not taking: Reported on 01/26/2020) 60 tablet 2     No current facility-administered medications for this visit.       Physical Exam    BP 136/73 (BP Site: L Arm, BP Position: Sitting, BP Cuff Size: Medium)  - Pulse 78  - Temp 36.4 ??C (97.5 ??F) (Temporal)  - Wt 80.6 kg (177 lb 9.6 oz)  - BMI 29.55 kg/m??   General: Patient is a pleasant female in no apparent distress.  Eyes: Sclera anicteric.  ENT: Mask in place.  Neck: Supple without LAD/JVD/bruits.  Lungs: Clear to auscultation bilaterally, no wheezes/rales/rhonchi.  Cardiovascular: Regular rate and rhythm without murmurs, rubs or gallops.  Abdomen: Soft, notender/nondistended. Positive bowel sounds. No tenderness over the graft.  Extremities: Without edema, joints without evidence of synovitis.  Skin: Without rash.  Neurological: Grossly nonfocal.  Psychiatric: Mood and affect appropriate.    Laboratory Results    Results for orders placed or performed in visit on 01/26/20   Urinalysis   Result Value Ref Range    Color, UA Yellow     Clarity, UA Clear     Specific Gravity, UA 1.015 1.005 - 1.030    pH, UA 6.0 5.0 - 9.0    Leukocyte Esterase, UA Negative Negative    Nitrite, UA Negative Negative    Protein, UA Negative Negative    Glucose, UA Negative Negative    Ketones, UA Negative Negative    Urobilinogen, UA 0.2 mg/dL 0.2 - 2.0 mg/dL    Bilirubin, UA Negative Negative    Blood, UA Negative Negative    RBC, UA <1 <4 /HPF    WBC, UA 2 0 - 5 /HPF    Squam Epithel, UA 5 0 - 5 /HPF    Bacteria, UA Rare (A) None Seen /HPF   Protein/Creatinine Ratio, Urine   Result Value Ref Range    Creat U 87.9 Undefined mg/dL    Protein, Ur 6.9 mg/dL    Protein/Creatinine Ratio, Urine 0.078 Undefined       Assessment and Plan    Labs 01/21/20  Urine collected 01/26/20    1. Status post renal transplant. Her creatinine is 1.45 and within her most recent baseline, has been elevated recently in the setting of elevated tacrolimus levels. Envarsus trough 5.6, and is a 24 hour trough. Below goal of 8-10, but not unreasonable given infections complications. Will continue current immunosuppression for now and follow up subsequent troughs.    2. Status post pancreas transplant. She has had a mildly elevated amylase since transplant, 143 on 12/30/19 with normal lipase. Peaked at 467 on 05/27/19. On 10/08/19 amylase mildly elevated at 103 and lipase normal at 21. Hgb A1c reassuring at 4.3% on 10/13/19.    3. CMV viremia. Continues on Valcyte 900 mg daily per ID.  Will increase Myfortic to 360 mg BID today. Per ID recommendations: Decrease Valcyte 900mg  qhs for secondary ppx until follow up in early Feb.    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 Suspect due to decreased immunosuppression in the setting of infection. Will continue to follow.    5. History of hypertension. Blood pressure reasonable off meds, may consider adding ACEi/ARB at some point given history of diabetes.    6. C.diff. Diarrhea improv: , on Dificid with FMT scheduled for 02/14/20. ID recommends the following: Decrease Dificid tp 200mg  once daily for secondary ppx until Dec  31 (three days prior to FMT).     7. Childbearing age. She is aware that Myfortic is teratogenic. Unable to get IUD locally. Will refer to [facility] for IUD.     8. Health maintenance. She received a flu shot in clinic today, 11/26/19. She has had a Pnemovax vaccine, needs Prevnar 13. Received both Covid vaccines and booster.     We discussed that while we continue to recommend the COVID vaccine for all eligible patients, we know that kidney transplant patients do not respond as strongly due to their immunosuppression, so she should continue to follow the CDC guidelines for patients who are not vaccinated. I would also encourage all household members and close contacts that are eligible for vaccination to get vaccinated. We discussed the importance of ongoing social distancing, wearing a mask outside the home, and hand washing/sanitizing.     9. Will see patient back in 8 weeks, or sooner if needed.

## 2020-01-27 NOTE — Unmapped (Signed)
Clinical Assessment Needed For: Dose Change  Medication: MYCOPHENOLATE 180MG  EC TABLET  Last Fill Date/Day Supply: 01/24/2020 / 30 DAYS  Copay $0  Was previous dose already scheduled to fill: No    Notes to Pharmacist: N/A

## 2020-01-31 MED ORDER — DIFICID 200 MG TABLET
ORAL_TABLET | Freq: Two times a day (BID) | ORAL | 2 refills | 30.00000 days | Status: CN
Start: 2020-01-31 — End: 2020-03-01

## 2020-02-07 DIAGNOSIS — Z940 Kidney transplant status: Principal | ICD-10-CM

## 2020-02-07 DIAGNOSIS — D849 Immunodeficiency, unspecified: Principal | ICD-10-CM

## 2020-02-11 LAB — MAGNESIUM: MAGNESIUM: 1.4 mg/dL — ABNORMAL LOW (ref 1.6–2.3)

## 2020-02-11 LAB — BASIC METABOLIC PANEL
BLOOD UREA NITROGEN: 29 mg/dL — ABNORMAL HIGH (ref 6–20)
BUN / CREAT RATIO: 20 (ref 9–23)
CALCIUM: 9.5 mg/dL (ref 8.7–10.2)
CHLORIDE: 108 mmol/L — ABNORMAL HIGH (ref 96–106)
CO2: 21 mmol/L (ref 20–29)
CREATININE: 1.47 mg/dL — ABNORMAL HIGH (ref 0.57–1.00)
GLUCOSE: 87 mg/dL (ref 65–99)
POTASSIUM: 5.5 mmol/L — ABNORMAL HIGH (ref 3.5–5.2)
SODIUM: 140 mmol/L (ref 134–144)

## 2020-02-11 LAB — CBC W/ DIFFERENTIAL
BANDED NEUTROPHILS ABSOLUTE COUNT: 0 10*3/uL (ref 0.0–0.1)
BASOPHILS ABSOLUTE COUNT: 0 10*3/uL (ref 0.0–0.2)
BASOPHILS RELATIVE PERCENT: 2 %
EOSINOPHILS ABSOLUTE COUNT: 0.1 10*3/uL (ref 0.0–0.4)
EOSINOPHILS RELATIVE PERCENT: 4 %
HEMATOCRIT: 34.6 % (ref 34.0–46.6)
HEMOGLOBIN: 11.2 g/dL (ref 11.1–15.9)
IMMATURE GRANULOCYTES: 1 %
LYMPHOCYTES ABSOLUTE COUNT: 0.3 10*3/uL — ABNORMAL LOW (ref 0.7–3.1)
LYMPHOCYTES RELATIVE PERCENT: 11 %
MEAN CORPUSCULAR HEMOGLOBIN CONC: 32.4 g/dL (ref 31.5–35.7)
MEAN CORPUSCULAR HEMOGLOBIN: 30.5 pg (ref 26.6–33.0)
MEAN CORPUSCULAR VOLUME: 94 fL (ref 79–97)
MONOCYTES ABSOLUTE COUNT: 0.3 10*3/uL (ref 0.1–0.9)
MONOCYTES RELATIVE PERCENT: 11 %
NEUTROPHILS ABSOLUTE COUNT: 1.9 10*3/uL (ref 1.4–7.0)
NEUTROPHILS RELATIVE PERCENT: 71 %
PLATELET COUNT: 266 10*3/uL (ref 150–450)
RED BLOOD CELL COUNT: 3.67 x10E6/uL — ABNORMAL LOW (ref 3.77–5.28)
RED CELL DISTRIBUTION WIDTH: 16.1 % — ABNORMAL HIGH (ref 11.7–15.4)
WHITE BLOOD CELL COUNT: 2.7 10*3/uL — ABNORMAL LOW (ref 3.4–10.8)

## 2020-02-11 LAB — PHOSPHORUS: PHOSPHORUS, SERUM: 2.5 mg/dL — ABNORMAL LOW (ref 3.0–4.3)

## 2020-02-13 MED ORDER — FIDAXOMICIN 200 MG TABLET
ORAL_TABLET | Freq: Two times a day (BID) | ORAL | 2 refills | 30.00000 days
Start: 2020-02-13 — End: 2020-03-14

## 2020-02-13 NOTE — Unmapped (Signed)
Patient having FMT tomorrow doesn't need anymore fidaxomicin

## 2020-02-14 DIAGNOSIS — A0471 Enterocolitis due to Clostridium difficile, recurrent: Principal | ICD-10-CM

## 2020-02-14 DIAGNOSIS — Z888 Allergy status to other drugs, medicaments and biological substances status: Principal | ICD-10-CM

## 2020-02-14 DIAGNOSIS — Z91041 Radiographic dye allergy status: Principal | ICD-10-CM

## 2020-02-14 DIAGNOSIS — Z9289 Personal history of other medical treatment: Principal | ICD-10-CM

## 2020-02-14 DIAGNOSIS — D849 Immunodeficiency, unspecified: Principal | ICD-10-CM

## 2020-02-14 DIAGNOSIS — E10319 Type 1 diabetes mellitus with unspecified diabetic retinopathy without macular edema: Principal | ICD-10-CM

## 2020-02-14 DIAGNOSIS — Z9483 Pancreas transplant status: Principal | ICD-10-CM

## 2020-02-14 DIAGNOSIS — R7881 Bacteremia: Principal | ICD-10-CM

## 2020-02-14 DIAGNOSIS — Z7952 Long term (current) use of systemic steroids: Principal | ICD-10-CM

## 2020-02-14 DIAGNOSIS — D573 Sickle-cell trait: Principal | ICD-10-CM

## 2020-02-14 DIAGNOSIS — Z940 Kidney transplant status: Principal | ICD-10-CM

## 2020-02-14 DIAGNOSIS — U071 COVID-19: Principal | ICD-10-CM

## 2020-02-14 DIAGNOSIS — Z7982 Long term (current) use of aspirin: Principal | ICD-10-CM

## 2020-02-14 DIAGNOSIS — E860 Dehydration: Principal | ICD-10-CM

## 2020-02-14 DIAGNOSIS — T8619 Other complication of kidney transplant: Principal | ICD-10-CM

## 2020-02-14 DIAGNOSIS — N179 Acute kidney failure, unspecified: Principal | ICD-10-CM

## 2020-02-14 DIAGNOSIS — Z79899 Other long term (current) drug therapy: Principal | ICD-10-CM

## 2020-02-14 DIAGNOSIS — B964 Proteus (mirabilis) (morganii) as the cause of diseases classified elsewhere: Principal | ICD-10-CM

## 2020-02-14 DIAGNOSIS — N10 Acute tubulo-interstitial nephritis: Principal | ICD-10-CM

## 2020-02-14 MED ADMIN — propofoL (DIPRIVAN) injection: INTRAVENOUS | @ 22:00:00 | Stop: 2020-02-14

## 2020-02-14 MED ADMIN — lidocaine (XYLOCAINE) 20 mg/mL (2 %) injection: INTRAVENOUS | @ 21:00:00 | Stop: 2020-02-14

## 2020-02-14 MED ADMIN — sodium chloride (NS) 0.9 % infusion: 25 mL/h | INTRAVENOUS | @ 21:00:00 | Stop: 2020-02-17

## 2020-02-14 MED ADMIN — propofoL (DIPRIVAN) injection: INTRAVENOUS | @ 21:00:00 | Stop: 2020-02-14

## 2020-02-14 MED ADMIN — propofol (DIPRIVAN) infusion 10 mg/mL: INTRAVENOUS | @ 21:00:00 | Stop: 2020-02-14

## 2020-02-14 NOTE — Unmapped (Signed)
Spoke to patient confirmed appointment for FMT today. GIP notified.

## 2020-02-15 LAB — TACROLIMUS LEVEL: TACROLIMUS BLOOD: 6.4 ng/mL (ref 2.0–20.0)

## 2020-02-17 ENCOUNTER — Ambulatory Visit
Admit: 2020-02-17 | Discharge: 2020-02-23 | Disposition: A | Discharge status: Home / Self Care | Payer: MEDICARE | Admitting: Nephrology

## 2020-02-17 ENCOUNTER — Encounter
Admit: 2020-02-17 | Discharge: 2020-02-23 | Disposition: A | Discharge status: Home / Self Care | Payer: MEDICARE | Admitting: Nephrology

## 2020-02-17 DIAGNOSIS — T8619 Other complication of kidney transplant: Principal | ICD-10-CM

## 2020-02-17 DIAGNOSIS — R7881 Bacteremia: Principal | ICD-10-CM

## 2020-02-17 DIAGNOSIS — U071 COVID-19: Principal | ICD-10-CM

## 2020-02-17 DIAGNOSIS — Z7982 Long term (current) use of aspirin: Principal | ICD-10-CM

## 2020-02-17 DIAGNOSIS — D573 Sickle-cell trait: Principal | ICD-10-CM

## 2020-02-17 DIAGNOSIS — Z9483 Pancreas transplant status: Principal | ICD-10-CM

## 2020-02-17 DIAGNOSIS — N10 Acute tubulo-interstitial nephritis: Principal | ICD-10-CM

## 2020-02-17 DIAGNOSIS — Z79899 Other long term (current) drug therapy: Principal | ICD-10-CM

## 2020-02-17 DIAGNOSIS — Z7952 Long term (current) use of systemic steroids: Principal | ICD-10-CM

## 2020-02-17 DIAGNOSIS — Z888 Allergy status to other drugs, medicaments and biological substances status: Principal | ICD-10-CM

## 2020-02-17 DIAGNOSIS — E860 Dehydration: Principal | ICD-10-CM

## 2020-02-17 DIAGNOSIS — B964 Proteus (mirabilis) (morganii) as the cause of diseases classified elsewhere: Principal | ICD-10-CM

## 2020-02-17 DIAGNOSIS — D849 Immunodeficiency, unspecified: Principal | ICD-10-CM

## 2020-02-17 DIAGNOSIS — E10319 Type 1 diabetes mellitus with unspecified diabetic retinopathy without macular edema: Principal | ICD-10-CM

## 2020-02-17 DIAGNOSIS — Z940 Kidney transplant status: Principal | ICD-10-CM

## 2020-02-17 DIAGNOSIS — Z9289 Personal history of other medical treatment: Principal | ICD-10-CM

## 2020-02-17 DIAGNOSIS — N179 Acute kidney failure, unspecified: Principal | ICD-10-CM

## 2020-02-17 DIAGNOSIS — A0471 Enterocolitis due to Clostridium difficile, recurrent: Principal | ICD-10-CM

## 2020-02-17 DIAGNOSIS — A419 Sepsis, unspecified organism: Principal | ICD-10-CM

## 2020-02-17 DIAGNOSIS — N3000 Acute cystitis without hematuria: Principal | ICD-10-CM

## 2020-02-17 DIAGNOSIS — Z91041 Radiographic dye allergy status: Principal | ICD-10-CM

## 2020-02-17 LAB — CBC W/ AUTO DIFF
BASOPHILS ABSOLUTE COUNT: 0 10*9/L (ref 0.0–0.1)
BASOPHILS RELATIVE PERCENT: 0.8 %
EOSINOPHILS ABSOLUTE COUNT: 0 10*9/L (ref 0.0–0.4)
EOSINOPHILS RELATIVE PERCENT: 0.7 %
HEMATOCRIT: 40.1 % (ref 36.0–46.0)
HEMOGLOBIN: 13.1 g/dL (ref 12.0–16.0)
LARGE UNSTAINED CELLS: 1 % (ref 0–4)
LYMPHOCYTES ABSOLUTE COUNT: 0.3 10*9/L — ABNORMAL LOW (ref 1.5–5.0)
LYMPHOCYTES RELATIVE PERCENT: 5.8 %
MEAN CORPUSCULAR HEMOGLOBIN CONC: 32.7 g/dL (ref 31.0–37.0)
MEAN CORPUSCULAR HEMOGLOBIN: 31.8 pg (ref 26.0–34.0)
MEAN CORPUSCULAR VOLUME: 97.1 fL (ref 80.0–100.0)
MEAN PLATELET VOLUME: 9.1 fL (ref 7.0–10.0)
MONOCYTES ABSOLUTE COUNT: 0.1 10*9/L — ABNORMAL LOW (ref 0.2–0.8)
MONOCYTES RELATIVE PERCENT: 1.2 %
NEUTROPHILS ABSOLUTE COUNT: 4.6 10*9/L (ref 2.0–7.5)
NEUTROPHILS RELATIVE PERCENT: 91.1 %
PLATELET COUNT: 251 10*9/L (ref 150–440)
RED BLOOD CELL COUNT: 4.13 10*12/L (ref 4.00–5.20)
RED CELL DISTRIBUTION WIDTH: 15.7 % — ABNORMAL HIGH (ref 12.0–15.0)
WBC ADJUSTED: 5 10*9/L (ref 4.5–11.0)

## 2020-02-17 LAB — URINALYSIS WITH CULTURE REFLEX
BILIRUBIN UA: NEGATIVE
BLOOD UA: NEGATIVE
GLUCOSE UA: NEGATIVE
KETONES UA: 20 — AB
NITRITE UA: POSITIVE — AB
PH UA: 8 (ref 5.0–9.0)
PROTEIN UA: 100 — AB
RBC UA: 1 /HPF (ref <=4)
SPECIFIC GRAVITY UA: 1.017 (ref 1.003–1.030)
SQUAMOUS EPITHELIAL: 3 /HPF (ref 0–5)
UROBILINOGEN UA: 0.2
WBC UA: 3 /HPF (ref 0–5)

## 2020-02-17 LAB — COMPREHENSIVE METABOLIC PANEL
ALBUMIN: 4.2 g/dL (ref 3.4–5.0)
ALKALINE PHOSPHATASE: 53 U/L (ref 46–116)
ALT (SGPT): 29 U/L (ref 10–49)
ANION GAP: 6 mmol/L (ref 5–14)
BILIRUBIN TOTAL: 1.4 mg/dL — ABNORMAL HIGH (ref 0.3–1.2)
BLOOD UREA NITROGEN: 19 mg/dL (ref 9–23)
BUN / CREAT RATIO: 8
CALCIUM: 9.7 mg/dL (ref 8.7–10.4)
CHLORIDE: 107 mmol/L (ref 98–107)
CO2: 24 mmol/L (ref 20.0–31.0)
CREATININE: 2.41 mg/dL — ABNORMAL HIGH
EGFR CKD-EPI AA FEMALE: 30 mL/min/{1.73_m2} — ABNORMAL LOW (ref >=60)
EGFR CKD-EPI NON-AA FEMALE: 26 mL/min/{1.73_m2} — ABNORMAL LOW (ref >=60)
GLUCOSE RANDOM: 105 mg/dL (ref 70–179)
PROTEIN TOTAL: 8.2 g/dL (ref 5.7–8.2)
SODIUM: 137 mmol/L (ref 135–145)

## 2020-02-17 LAB — BLOOD GAS CRITICAL CARE PANEL, VENOUS
BASE EXCESS VENOUS: -1.5 (ref -2.0–2.0)
CALCIUM IONIZED VENOUS (MG/DL): 4.14 mg/dL — ABNORMAL LOW (ref 4.40–5.40)
GLUCOSE WHOLE BLOOD: 98 mg/dL (ref 70–179)
HCO3 VENOUS: 23 mmol/L (ref 22–27)
HEMOGLOBIN BLOOD GAS: 12.7 g/dL (ref 12.00–16.00)
LACTATE BLOOD VENOUS: 1.1 mmol/L (ref 0.5–1.8)
O2 SATURATION VENOUS: 44.8 % (ref 40.0–85.0)
PCO2 VENOUS: 39 mmHg — ABNORMAL LOW (ref 40–60)
PH VENOUS: 7.39 (ref 7.32–7.43)
PO2 VENOUS: 27 mmHg — ABNORMAL LOW (ref 30–55)
POTASSIUM WHOLE BLOOD: 6.9 mmol/L (ref 3.4–4.6)
SODIUM WHOLE BLOOD: 141 mmol/L (ref 135–145)

## 2020-02-17 LAB — APTT
APTT: 34.3 s (ref 24.9–36.9)
HEPARIN CORRELATION: 0.2

## 2020-02-17 LAB — PROTIME-INR
INR: 1.08
PROTIME: 12.6 s (ref 10.3–13.4)

## 2020-02-17 LAB — AST: AST (SGOT): 22 U/L (ref <=34)

## 2020-02-17 LAB — HCG QUANTITATIVE, BLOOD: GONADOTROPIN, CHORIONIC (HCG) QUANT: <2.6 m[IU]/mL

## 2020-02-17 LAB — MAGNESIUM: MAGNESIUM: 1.5 mg/dL — ABNORMAL LOW (ref 1.6–2.6)

## 2020-02-17 LAB — SLIDE REVIEW

## 2020-02-17 LAB — POTASSIUM
POTASSIUM: 4.5 mmol/L (ref 3.4–4.5)
POTASSIUM: 4.4 mmol/L (ref 3.4–4.5)

## 2020-02-17 MED ADMIN — acetaminophen (TYLENOL) tablet 650 mg: 650 mg | ORAL | @ 23:00:00 | Stop: 2020-02-17

## 2020-02-17 MED ADMIN — ondansetron (ZOFRAN) injection 4 mg: 4 mg | INTRAVENOUS | @ 23:00:00 | Stop: 2020-02-17

## 2020-02-17 NOTE — Unmapped (Signed)
Pt reports fecal transplant on Monday, kidney transplant in march. PT reports lower back and abd pain x2days. Pt reports nausea and vomiting, denies fevers. Pt reports watery stool since transplant

## 2020-02-17 NOTE — Unmapped (Signed)
This covering coordinator called pt. Back in response to page.   Pt. Tearful over the phone and could barely answer triage questions.     Pt. Having cramping in lower back/nausea/vomiting at least once daily, denies fever.     Pt. Hasn't been feeling well for 2 days, after her colonoscopy this week    Pt. Having burning upon urination and frequency, pt. Not able to leave a urine sample today as requested due to feeling 'so bad'     She called OBGYN- provider sent medication- cipro based on pt. Reporting s/s via phone    After discussing above with Dr Carlene Coria, Pt. Advised to not take the cipro, d/t recent Fecal transplant   And advised to come to [facility] ED    Pt agrees to plan and will come to [facility] ED asap

## 2020-02-17 NOTE — Unmapped (Signed)
Emergency Department Provider in Triage Note    Elaine Koch is a 35 y.o. 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 Her pain is unimproved with Tylenol. The patient endorses nausea, vomiting, and dysuria as well as watery stools since her fecal transplant. Denies chest pain, SOB, fevers, orhematochezia. No recent sick contacts. The patient is vaccinated for COVID and the flu.      Past Medical History:   Diagnosis Date   ??? Chronic hypertension during pregnancy, antepartum 07/22/2015    Overview:  Methyldopa recommended per nephrologist if needed   ??? Diabetes mellitus type 1 (CMS-HCC)    ??? ESRD (end stage renal disease) (CMS-HCC)    ??? History of pre-eclampsia 10/24/2016   ??? 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 Stage 5 chronic kidney disease on chronic dialysis (CMS-HCC) 10/24/2016     Past Surgical History:   Procedure Laterality Date   ??? AV FISTULA PLACEMENT  2018   ??? CESAREAN SECTION     ??? PR COLONOSCOPY FLX DX W/COLLJ SPEC WHEN PFRMD N/A 02/14/2020    Procedure: COLONOSCOPY, FLEXIBLE, PROXIMAL TO SPLENIC FLEXURE; DIAGNOSTIC, W/WO COLLECTION SPECIMEN BY BRUSH OR WASH;  Surgeon: Carmon Ginsberg, MD;  Location: [facility];  Service: Gastroenterology   ??? PR FECAL MICROBIOTA PREP INSTIL N/A 02/14/2020    Procedure: PREP W INSTILLATION FECAL MICROBIOTA, ANY METHOD;  Surgeon: Carmon Ginsberg, MD;  Location: [facility];  Service: Gastroenterology   ??? PR TRANSPLANT ALLOGRAFT PANCREAS N/A 05/03/2019    Procedure: TRANSPLANTATION OF PANCREATIC ALLOGRAFT;  Surgeon: Leona Carry, MD;  Location: MAIN OR [facility];  Service: Transplant   ??? PR TRANSPLANT,PREP CADAVER RENAL GRAFT N/A 05/03/2019    Procedure: [facility] STD PREP CAD DONR RENAL ALLOGFT PRIOR TO TRNSPLNT, INCL DISSEC/REM PERINEPH FAT, DIAPH/RTPER ATTAC;  Surgeon: Leona Carry, MD;  Location: MAIN OR [facility];  Service: Transplant   ??? PR TRANSPLANT,PREP DONOR PANCREAS N/A 05/03/2019    Procedure: [facility] STANDARD PREPARATION OF CADAVER DONOR PANCREAS ALLOGRAFT PRIOR TO TRANSPLANTATION;  Surgeon: Leona Carry, MD;  Location: MAIN OR [facility];  Service: Transplant   ??? PR TRANSPLANTATION OF KIDNEY N/A 05/03/2019    Procedure: RENAL ALLOTRANSPLANTATION, IMPLANTATION OF GRAFT; WITHOUT RECIPIENT NEPHRECTOMY;  Surgeon: Leona Carry, MD;  Location: MAIN OR [place];  Service: Transplant   ??? TONSILLECTOMY         PHYSICAL EXAM    BP 123/70  - Pulse 101  - Resp 16  - SpO2 99%   CONSTITUTIONAL:  Awake, alert, uncomfortable appearing.  HEENT: Conjunctiva normal  CARD: Well perfused hands. Normal cap refill  RESP: slightly increased rr  GI: Diffuse abdominal discomfort.  GU: Positive bilateral CVA tenderness to palpation.  NEURO: Alert and appropriate    Plan: Plan for EKG, CT abdomen, UA, coags, OCVID-19 PCR, and basic labs. Will give cefepime and tylenol for pain management.    Sherlene Shams  February 17, 2020 3:45 PM    Documentation assistance was provided by Lorrine Kin, the scribe, in my presence.  The documentation recorded by the scribe has been reviewed by me and accurately reflects the services I personally performed.

## 2020-02-18 DIAGNOSIS — Z7982 Long term (current) use of aspirin: Principal | ICD-10-CM

## 2020-02-18 DIAGNOSIS — B964 Proteus (mirabilis) (morganii) as the cause of diseases classified elsewhere: Principal | ICD-10-CM

## 2020-02-18 DIAGNOSIS — N179 Acute kidney failure, unspecified: Principal | ICD-10-CM

## 2020-02-18 DIAGNOSIS — Z9483 Pancreas transplant status: Principal | ICD-10-CM

## 2020-02-18 DIAGNOSIS — E860 Dehydration: Principal | ICD-10-CM

## 2020-02-18 DIAGNOSIS — E10319 Type 1 diabetes mellitus with unspecified diabetic retinopathy without macular edema: Principal | ICD-10-CM

## 2020-02-18 DIAGNOSIS — Z9289 Personal history of other medical treatment: Principal | ICD-10-CM

## 2020-02-18 DIAGNOSIS — A0471 Enterocolitis due to Clostridium difficile, recurrent: Principal | ICD-10-CM

## 2020-02-18 DIAGNOSIS — Z888 Allergy status to other drugs, medicaments and biological substances status: Principal | ICD-10-CM

## 2020-02-18 DIAGNOSIS — Z91041 Radiographic dye allergy status: Principal | ICD-10-CM

## 2020-02-18 DIAGNOSIS — D573 Sickle-cell trait: Principal | ICD-10-CM

## 2020-02-18 DIAGNOSIS — T8619 Other complication of kidney transplant: Principal | ICD-10-CM

## 2020-02-18 DIAGNOSIS — N10 Acute tubulo-interstitial nephritis: Principal | ICD-10-CM

## 2020-02-18 DIAGNOSIS — Z7952 Long term (current) use of systemic steroids: Principal | ICD-10-CM

## 2020-02-18 DIAGNOSIS — R7881 Bacteremia: Principal | ICD-10-CM

## 2020-02-18 DIAGNOSIS — Z79899 Other long term (current) drug therapy: Principal | ICD-10-CM

## 2020-02-18 DIAGNOSIS — U071 COVID-19: Principal | ICD-10-CM

## 2020-02-18 DIAGNOSIS — D849 Immunodeficiency, unspecified: Principal | ICD-10-CM

## 2020-02-18 LAB — CBC W/ AUTO DIFF
BASOPHILS ABSOLUTE COUNT: 0 10*9/L (ref 0.0–0.1)
BASOPHILS RELATIVE PERCENT: 0.5 %
EOSINOPHILS ABSOLUTE COUNT: 0 10*9/L (ref 0.0–0.4)
EOSINOPHILS RELATIVE PERCENT: 0.2 %
HEMATOCRIT: 35.9 % — ABNORMAL LOW (ref 36.0–46.0)
HEMOGLOBIN: 11.9 g/dL — ABNORMAL LOW (ref 12.0–16.0)
LARGE UNSTAINED CELLS: 0 % (ref 0–4)
LYMPHOCYTES ABSOLUTE COUNT: 0.4 10*9/L — ABNORMAL LOW (ref 1.5–5.0)
LYMPHOCYTES RELATIVE PERCENT: 4.5 %
MEAN CORPUSCULAR HEMOGLOBIN CONC: 33.1 g/dL (ref 31.0–37.0)
MEAN CORPUSCULAR HEMOGLOBIN: 31.9 pg (ref 26.0–34.0)
MEAN CORPUSCULAR VOLUME: 96.3 fL (ref 80.0–100.0)
MEAN PLATELET VOLUME: 8.9 fL (ref 7.0–10.0)
MONOCYTES ABSOLUTE COUNT: 0.2 10*9/L (ref 0.2–0.8)
MONOCYTES RELATIVE PERCENT: 1.9 %
NEUTROPHILS ABSOLUTE COUNT: 7.6 10*9/L — ABNORMAL HIGH (ref 2.0–7.5)
NEUTROPHILS RELATIVE PERCENT: 92.5 %
PLATELET COUNT: 207 10*9/L (ref 150–440)
RED BLOOD CELL COUNT: 3.73 10*12/L — ABNORMAL LOW (ref 4.00–5.20)
RED CELL DISTRIBUTION WIDTH: 15.7 % — ABNORMAL HIGH (ref 12.0–15.0)
WBC ADJUSTED: 8.2 10*9/L (ref 4.5–11.0)

## 2020-02-18 LAB — BASIC METABOLIC PANEL
ANION GAP: 6 mmol/L (ref 5–14)
BLOOD UREA NITROGEN: 21 mg/dL (ref 9–23)
BUN / CREAT RATIO: 9
CALCIUM: 9 mg/dL (ref 8.7–10.4)
CHLORIDE: 106 mmol/L (ref 98–107)
CO2: 25 mmol/L (ref 20.0–31.0)
CREATININE: 2.34 mg/dL — ABNORMAL HIGH
EGFR CKD-EPI AA FEMALE: 31 mL/min/{1.73_m2} — ABNORMAL LOW (ref >=60)
EGFR CKD-EPI NON-AA FEMALE: 27 mL/min/{1.73_m2} — ABNORMAL LOW (ref >=60)
GLUCOSE RANDOM: 114 mg/dL (ref 70–179)
POTASSIUM: 4.6 mmol/L — ABNORMAL HIGH (ref 3.4–4.5)
SODIUM: 137 mmol/L (ref 135–145)

## 2020-02-18 LAB — D-DIMER, QUANTITATIVE: D-DIMER QUANTITATIVE (CW,ML,HL,HS,CH): 990 ng{FEU}/mL — ABNORMAL HIGH (ref <=500)

## 2020-02-18 LAB — AMYLASE: AMYLASE: 48 U/L (ref 30–118)

## 2020-02-18 LAB — LIPASE: LIPASE: 17 U/L (ref 12–53)

## 2020-02-18 LAB — C-REACTIVE PROTEIN
C-REACTIVE PROTEIN: 105 mg/L — ABNORMAL HIGH (ref <=10.0)
C-REACTIVE PROTEIN: 228 mg/L — ABNORMAL HIGH (ref <=10.0)

## 2020-02-18 LAB — MAGNESIUM: MAGNESIUM: 1.3 mg/dL — ABNORMAL LOW (ref 1.6–2.6)

## 2020-02-18 LAB — TACROLIMUS LEVEL, TROUGH: TACROLIMUS, TROUGH: 7.4 ng/mL (ref 5.0–15.0)

## 2020-02-18 MED ADMIN — ampicillin (OMNIPEN) 2 g in sodium chloride 0.9 % (NS) 100 mL IVPB-connector bag: 2 g | INTRAVENOUS | @ 18:00:00 | Stop: 2020-02-18

## 2020-02-18 MED ADMIN — vancomycin (VANCOCIN) 1500 mg in sodium chloride (NS) 0.9 % 500 mL IVPB (premix): 1500 mg | INTRAVENOUS | @ 02:00:00 | Stop: 2020-02-17

## 2020-02-18 MED ADMIN — remdesivir (VEKLURY) 200 mg in sodium chloride (NS) 0.9 % 315 mL IVPB: 200 mg | INTRAVENOUS | @ 20:00:00 | Stop: 2020-02-18

## 2020-02-18 MED ADMIN — acetaminophen (TYLENOL) tablet 1,000 mg: 1000 mg | ORAL | @ 04:00:00 | Stop: 2020-02-17

## 2020-02-18 MED ADMIN — magnesium sulfate 2gm/50mL IVPB: 2 g | INTRAVENOUS | @ 13:00:00 | Stop: 2020-02-18

## 2020-02-18 MED ADMIN — heparin (porcine) 5,000 unit/mL injection 5,000 Units: 5000 [IU] | SUBCUTANEOUS | @ 11:00:00 | Stop: 2020-02-22

## 2020-02-18 MED ADMIN — ondansetron (ZOFRAN) injection 4 mg: 4 mg | INTRAVENOUS | @ 22:00:00 | Stop: 2020-02-22

## 2020-02-18 MED ADMIN — heparin (porcine) 5,000 unit/mL injection 5,000 Units: 5000 [IU] | SUBCUTANEOUS | @ 04:00:00 | Stop: 2020-02-22

## 2020-02-18 MED ADMIN — lactated ringers bolus 1,000 mL: 1000 mL | INTRAVENOUS | @ 04:00:00 | Stop: 2020-02-18

## 2020-02-18 MED ADMIN — acetaminophen (OFIRMEV) 10 mg/mL injection 650 mg 65 mL: 650 mg | INTRAVENOUS | @ 12:00:00 | Stop: 2020-02-18

## 2020-02-18 MED ADMIN — lactated ringers bolus 500 mL: 500 mL | INTRAVENOUS | @ 01:00:00 | Stop: 2020-02-17

## 2020-02-18 MED ADMIN — cefepime (MAXIPIME) 1 g in sodium chloride 0.9 % (NS) 100 mL IVPB-connector bag: 1 g | INTRAVENOUS | @ 18:00:00 | Stop: 2020-02-19

## 2020-02-18 MED ADMIN — sodium bicarbonate tablet 1,300 mg: 1300 mg | ORAL | @ 04:00:00 | Stop: 2020-02-22

## 2020-02-18 MED ADMIN — acetaminophen (OFIRMEV) 10 mg/mL injection 1,000 mg: 1000 mg | INTRAVENOUS | @ 05:00:00 | Stop: 2020-02-18

## 2020-02-18 MED ADMIN — lactated Ringers infusion: 100 mL/h | INTRAVENOUS | @ 13:00:00 | Stop: 2020-02-19

## 2020-02-18 MED ADMIN — cefepime (MAXIPIME) 2 g in dextrose 100 mL IVPB (premix): 2 g | INTRAVENOUS | @ 01:00:00 | Stop: 2020-02-17

## 2020-02-18 MED ADMIN — heparin (porcine) 5,000 unit/mL injection 5,000 Units: 5000 [IU] | SUBCUTANEOUS | @ 22:00:00 | Stop: 2020-02-22

## 2020-02-18 MED ADMIN — ampicillin (OMNIPEN) 2 g in sodium chloride 0.9 % (NS) 100 mL IVPB-connector bag: 2 g | INTRAVENOUS | @ 09:00:00 | Stop: 2020-02-18

## 2020-02-18 MED ADMIN — promethazine (PHENERGAN) 12.5 mg in sodium chloride (NS) 0.9 % 25 mL infusion: 12.5 mg | INTRAVENOUS | @ 04:00:00 | Stop: 2020-02-22

## 2020-02-18 NOTE — Unmapped (Signed)
Tacrolimus Therapeutic Monitoring Pharmacy Note    Elaine Koch is a 35 y.o. female continuing tacrolimus.     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 Per patient did take dose on 02/17/20 - however unknown timing    Pharmacokinetic Considerations and Significant Drug Interactions:  Concurrent hepatotoxic medications: None identified  Concurrent CYP3A4 substrates/inhibitors: None identified  Concurrent nephrotoxic medications: None identified    Assessment/Plan:  Recommendedation(s)  Patient unable to tolerate PO, missed dose on 02/18/20  Continue current regimen of Envarsus 21 mg PO daily    Follow-up  Next level has been ordered on 02/20/20 at 0600 .   A pharmacist will continue to monitor and recommend levels as appropriate    Please page service pharmacist with questions/clarifications.    Alberteen Spindle, PharmD Candidate    Annie Sable, PharmD  PGY1 Pharmacy Resident

## 2020-02-18 NOTE — Unmapped (Signed)
NEW IMMUNOCOMPROMISED HOST INFECTIOUS DISEASE CONSULT NOTE      Elaine Koch is being seen in consultation at the request of Drucilla Schmidt, MD for evaluation of GNR bacteremia and COVID-19.    Assessment/Recommendations:    Elaine Koch is a 35 y.o. 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  Pertinent Co-morbidities  #T1DM  - diagnosed at age 34, c/b nephropathy and retinopathy  -?? 10/13/2019??A1C 4.3??  ??  #Sickle cell trait  ??  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    ??#mild COVID infection  - 02/17/20 COVID PCR positive with Ct 18.3    #GNR bacteremia, unclear source 02/17/20. Potentially related to FMT on 02/14/20  - 1/6 presented febrile, back pain. 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 Fara Boros on 02/14/20  ??  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  # E. faecalis??UTI vs menstrual cramping (w/o bleeding)??10/01/19  - 8/20 fever T 38.3??but no dysuria, no suprapubic pain. Tender on LLQ over??renal??allograft  - 8/21 UA wbc 3;??8/21 UCx 50k-100k E. Faecalis??(R-doxy, S-amp, nitrofurantoin)  - 8/21 unremarkable renal US??(CT c/a/p, transvaginal US)  - s/p??linezolid 600mg  BID PO x7days 8/22-8/28       RECOMMENDATIONS FOR 02/18/2020    Diagnostic  ??? Fu 1/6 urine cx  ??? Fu 1/6 blood cx GNR speciation, send repeat blood culture today (1/7) 2 sets  ??? Send CMV PCR blood    Treatment    For GNR bacteremia  ?? CONTINUE cefepime  ?? STOP ampicillin    For history of recurrent C. Diff, now s/p recent FMT  ??? START po fidaxomicin 200 mg BID for C. Diff ppx while on broad spectrum abx    For ppx:  ??? Continue valganciclovir renally dosed equivalent of 900 mg daily (ppx dosing)    For mild COVID-19 infection:    [x]  Continue supportive care. See the ICU and non-ICU COVID-19 Care Cards for suggested guidance [facility] COVID-19 Clinical Care Resources page).    [x]  Treat with COVID-19 monoclonal antibody therapy for COVID-19 with casirivimab/imdevimab. To initiate, please:    1. Provide the patient (or the designated Management consultant) with the FDA Emergency Use Authorization (EUA) fact sheet (English: http://lara.info/ -- Spanish: MyFabulous.co.nz), and inform them that casirivimab/imdevimab is an unapproved drug that is authorized for use through an EUA program based on initial evidence suggesting benefit.  If this is not possible due to patient factors or because the decision-maker is unavailable, monoclonal antibodies  for COVID-19 can still be used on an emergent basis.    2. Use the CASIRIVIMAB/IMDEVIMAB INFUSION ORDERS Epic order set to start casirivimab/indevimab 1200mg  IV x 1.     3. Monitor vital signs at baseline, 30 minutes into infusion, and at end of infusion as specified in order set.        ??? Note that the primary team is responsible for reporting medication errors and adverse effects occurring under the EUA within 7 days. Infectious disease pharmacists are available to assist with reporting upon request. Your service pharmacist can contact the ID pharmacist directly, or you can reach them at pager [phone] during business hours.    [x] Treat with remdesivir. To initiate, please:  1. Use the REMDESIVIR ORDER PANEL (WT BASED) Epic order set to start remdesivir 200mg  IV x1 on day 1, followed by 100mg  IV qday on days 2-10 depending on clinical progression and discharge.    2. Monitor serum Cr and LFTs during therapy. Remdesivir should be stopped if the ALT exceeds 10x the upper limit of normal.      Other recommendations:  ?? Continue special airborne per infection prevention guidelines.  ?? Add on baseline CRP                  The [facility] ID service will continue to follow.  Please page the ID Transplant/Liquid Oncology Fellow consult at [phone] with questions.  Patient discussed with Dr. Cardell Peach.      History of Present Illness:      Source of information includes:  Electronic Medical Records.  History obtained from:patient.      Last saw Dr. Reynold Bowen in ID clinic at 11/12. At that visit, decreased fidaxomicin to 200 mg daily for secondary ppx until 3 days prior to FMT. Also decreased Valcyte to 900 mg daily for secondary ppx. She underwent flex sig with FMT on 02/14/20. Called transplant coordinator yesterday (1/6) with cramping in lower back as well as N/V, also with some dysuria. She called OB, who prescribed cipro which patient did not take. She was advised to come to the ED. ON presentation, noted to have AKI (Cr 2.4 up from b/l 1.4-1.6), UA with nitrites and LE, rare bacteria. Febrile.  Also found to be COVID+. She was given broad spectrum abx with vanc/cefepime, and blood and urine cxs were drawn. MMF and pred are being held (continued on tac).       On interview, she confirms above story. Feeling well before FMT. Symptoms all started on 1/5. Complaining of b/l flank pain, fatigue, dysuria, fatigue. Wasn't having fever at home. Now having shaking chills. Vaccinated x 3 (pfizer). Lives with 71 yo son, who was in daycare. Unsure of COVID exposure.     Allergies:  Allergies   Allergen Reactions   ??? Iodinated Contrast Media Swelling, Rash and Other (See Comments)     Burning, warmth throughout body and tingling.  Throat swelling.  Treated with benadryl and symptoms improved.  Has not had contrast since then (2013 or 2014).   ??? Nickel Rash   ??? Propranolol Swelling   ??? Eye Irrigating Solution [Ophthalmic Irrigation Solution] Other (See Comments)     Contrast dye (name?) used in eyes caused hot feeling in face, reversed with Benadryl.    ??? Iodine      Other reaction(s): Skin Rash   ??? Naltrexone Rash   ??? Uni-Cortrom Rash       Medications:   Antimicrobials:  Anti-infectives (From admission, onward)  Start     Dose/Rate Route Frequency Ordered Stop    02/18/20 0400  ampicillin (OMNIPEN) 2 g in sodium chloride 0.9 % (NS) 100 mL IVPB-connector bag         2 g  200 mL/hr over 30 Minutes Intravenous Every 12 hours 02/17/20 2110 02/25/20 0359    02/17/20 2200  valGANciclovir (VALCYTE) tablet 450 mg         450 mg Oral Every 48 hours 02/17/20 2110            Current/Prior immunomodulators:  Tac  pred (held)  MMF (held)    Other medications reviewed.     Medical History:  Past Medical History:   Diagnosis Date   ??? Chronic hypertension during pregnancy, antepartum 07/22/2015    Overview:  Methyldopa recommended per nephrologist if needed   ??? Diabetes mellitus type 1 (CMS-HCC)    ??? ESRD (end stage renal disease) (CMS-HCC)    ??? History of pre-eclampsia 10/24/2016   ??? 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 Stage 5 chronic kidney disease on chronic dialysis (CMS-HCC) 10/24/2016       Surgical History:  Past Surgical History:   Procedure Laterality Date   ??? AV FISTULA PLACEMENT  2018   ??? CESAREAN SECTION     ??? PR COLONOSCOPY FLX DX W/COLLJ SPEC WHEN PFRMD N/A 02/14/2020    Procedure: COLONOSCOPY, FLEXIBLE, PROXIMAL TO SPLENIC FLEXURE; DIAGNOSTIC, W/WO COLLECTION SPECIMEN BY BRUSH OR WASH;  Surgeon: Carmon Ginsberg, MD;  Location: [facility];  Service: Gastroenterology   ??? PR FECAL MICROBIOTA PREP INSTIL N/A 02/14/2020    Procedure: PREP W INSTILLATION FECAL MICROBIOTA, ANY METHOD;  Surgeon: Carmon Ginsberg, MD;  Location: [facility];  Service: Gastroenterology   ??? PR TRANSPLANT ALLOGRAFT PANCREAS N/A 05/03/2019    Procedure: TRANSPLANTATION OF PANCREATIC ALLOGRAFT;  Surgeon: Leona Carry, MD;  [facility]: [facility] OR [facility];  Service: Transplant   ??? PR TRANSPLANT,PREP CADAVER RENAL GRAFT N/A 05/03/2019    Procedure: [facility] STD PREP CAD DONR RENAL ALLOGFT PRIOR TO TRNSPLNT, INCL DISSEC/REM PERINEPH FAT, DIAPH/RTPER ATTAC;  Surgeon: Leona Carry, MD;  Location: MAIN OR [facility];  Service: Transplant   ??? PR TRANSPLANT,PREP DONOR PANCREAS N/A 05/03/2019    Procedure: [facility] STANDARD PREPARATION OF CADAVER DONOR PANCREAS ALLOGRAFT PRIOR TO TRANSPLANTATION;  Surgeon: Leona Carry, MD;  Location: MA[facility] OR [facility];  Service: Transplant   ??? PR TRANSPLANTATION OF KIDNEY N/A 05/03/2019    Procedure: RENAL ALLOTRANSPLANTATION, IMPLANTATION OF GRAFT; WITHOUT RECIPIENT NEPHRECTOMY;  Surgeon: Leona Carry, MD;  Location: MAIN OR [facility];  Service: Transplant   ??? TONSILLECTOMY         Social History:  Tobacco use:   reports that she has never smoked. She has never used smokeless tobacco.   Alcohol use:    reports no history of alcohol use.   Drug use:    reports no history of drug use.   Living situation:  lives with son   Residence:   [place], [place]     Family History:  no recent sick contacts in family and no history active TB in a family member  Family History   Problem Relation Age of Onset   ??? Diabetes Mother    ??? Diabetes Father    ??? Cancer Maternal Grandmother    ??? Diabetes Maternal Grandfather    ??? Diabetes Paternal Grandmother        Review of  Systems:  All other systems reviewed are negative.          Vital Signs last 24 hours:  Temp:  [37.6 ??C (99.7 ??F)-39.3 ??C (102.7 ??F)] 38.4 ??C (101.1 ??F)  Heart Rate:  [93-116] 96  SpO2 Pulse:  [93-116] 96  Resp:  [16-25] 25  BP: (123-183)/(70-88) 145/88  MAP (mmHg):  [93-112] 105  SpO2:  [95 %-99 %] 95 %    Physical Exam:  Patient Lines/Drains/Airways Status     Active Active Lines, Drains, & Airways     Name Placement date Placement time Site Days    Peripheral IV 02/17/20 Left Antecubital 02/17/20  1556  Antecubital  less than 1    Arteriovenous Fistula - Vein Graft  Access Arteriovenous fistula Right;Upper Arm ???  ???  Arm                GEN:  looks well, no apparent distress  EYES: PERRL, EOMI  ENT:no thrush, leukoplakia or oral lesions  ZO:[id number]  PULM:normal work of breathing at rest  JX:BJYN, diffuse TTP, b/l flank pain  WG:[id number]  RECTAL:deferred  SKIN:no petechiae, ecchymoses or obvious rashes on clothed exam  NEURO:no tremor noted, facial expression symmetric and moves extremities equally  PSYCH:fatigued, soft voide      Data for Medical Decision Making     Recent Labs   Lab Units 02/18/20  0554 02/17/20  2314 02/17/20  1741 02/17/20  1555   WBC 10*9/L 8.2  --   --  5.0   HEMOGLOBIN g/dL 65.7*  --   --  84.6   PLATELET COUNT (1) 10*9/L 207  --   --  251   NEUTRO ABS 10*9/L 7.6*  --   --  4.6   LYMPHO ABS 10*9/L 0.4*  --   --  0.3*   EOSINO ABS 10*9/L 0.0  --   --  0.0   BUN mg/dL 21  --   --  19   CREATININE mg/dL 9.62*  --   --  9.52*   AST U/L  --   --  22  --    ALT U/L  --   --   --  29   BILIRUBIN TOTAL mg/dL  --   --   --  1.4*   ALK PHOS U/L  --   --   --  53   POTASSIUM WHOLE BLOOD mmol/L  --   --   --  6.9*   POTASSIUM mmol/L 4.6* 4.4 4.5  --    MAGNESIUM mg/dL 1.3*  --   --  1.5*   CALCIUM mg/dL 9.0  --   --  9.7       New Culture Data       Recent Studies       Serologies:  Lab Results   Component Value Date    CMV IGG Positive (A) 05/02/2019    EBV VCA IgG Antibody Positive (A) 05/02/2019    Hep A IgG Nonreactive 03/26/2018    Hep B Surface Ag Nonreactive 05/02/2019    Hep B S Ab Reactive (A) 05/02/2019    Hep B Surf Ab Quant 209.45 (H) 05/02/2019    Hepatitis C Ab Nonreactive 05/02/2019    RPR Nonreactive 05/02/2019    HSV 1 IgG Positive (A) 05/02/2019    HSV 2 IgG Positive (A) 05/02/2019    Varicella IgG Positive 05/02/2019    Rubella IgG Scr Positive 03/26/2018    Toxoplasma Gondii IgG Negative 03/26/2018  Quantiferon TB Gold Plus Interpretation Negative 03/26/2018    Quantiferon Mitogen Minus Nil >10.00 03/26/2018    Quantiferon Antigen 1 minus Nil 0.00 03/26/2018       Immunizations:  Immunization History   Administered Date(s) Administered   ??? COVID-19 VACC,MRNA,(PFIZER)(PF)(IM) 04/29/2019, 08/06/2019, 10/13/2019   ??? HPV Quadrivalent (Gardasil) 05/28/2012   ??? Hepatitis B, Adult 12/10/2017   ??? Influenza Vaccine Quad (IIV4 PF) 19mo+ injectable 10/17/2015, 01/29/2019, 11/26/2019   ??? Influenza Virus Vaccine, unspecified formulation 11/12/2016, 01/31/2019   ??? PNEUMOCOCCAL POLYSACCHARIDE 23 12/20/2016   ??? TdaP 10/17/2015

## 2020-02-18 NOTE — Unmapped (Addendum)
Received call from ED about this patient with hx of kidney/pancrease transplant in March 2021, fecal transplant on Monday for C.diff, who has presented with diarrhea, dysuria, abdominal and flank pain, nausea, vomiting. Cr has increased to  2.4 from baseline 1.4-1.6. UA with nitrites, bacteria. COVID +. Vitals stable, not-hypoxic, no evidence of shock.   - Patient needs to be admitted  - Agree with blood and urine cultures, broad spectrum abx with Vanc/Cefepime for treatment of graft UTI.   - Continue IVF's overnight given history of vomiting, diarrhea, poor PO intake.  - ICID needs to be consulted while admitted. Would discuss what is appropriate c.diff ppx for her while on broad spectrum antibiotics.   - Appropriate COVID therapies need to be considered (monoclonal antibody therapy?).   - Hold mycophenolate  - Continue current dose of Envarsus. Please obtain AM tac trough ~30 minutes prior to AM dose of Envarsus. Unclear what her tac goal is based on documentation with recent infections.   - Full consult note by transplant nephrology will be done tomorrow.     Tonye Royalty  [place] Division of Nephrology and Hypertension [id number]

## 2020-02-18 NOTE — Unmapped (Signed)
Pt has a temp of 101.58F   and  stated she can't take PO  Tylenol , provider informed.

## 2020-02-18 NOTE — Unmapped (Addendum)
[facility] Emergency Department Provider Note      ED Course, Assessment and Plan     Initial Clinical Impression:    February 17, 2020 5:43 PM   Elaine Koch is a 35 y.o. 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 as described below.    On exam, febrile to 101F, tachycardic to 107, hypertensive SBP >150, RR 20 on RA. Ill-appearing.  Normal cardiopulmonary exam. Normal neurologic exam. Abdominal exam notable for BLLQ and bilat flank pain, otherwise belly is soft with +BS. Found to be COVID+ on admission. VBG K 6.9, though serum K hemolyzed. EKG without evidence of hyperkalemia and she is without myalgia, muscle weakness, palpitations, or paresthesias. UA c/w UTI. VBG with nml pH    BP 123/70  - Pulse 101  - Resp 16  - SpO2 99%     Differential diagnosis includes sepsis vs intra-abdominal abscess vs treatment failure vs acute cystitis vs pyelonephritis, vs SBO vs viral gastroenteritis. Highest on ddx includes sepsis from possible urinary source given VS instability and pos UA with h/o multiple UTIs. Prior UCx on 11/26/19 and 10/02/2019 grew Enterococcus faecalis. She also has an AKI that could be due to volume depletion vs transplant-related    Will obtain BCx2 prior to initiating antibiotics as well as obtain CT abdomen w/o contrast. Will treat patient with empiric antibiotics including ampicillin given prior susceptibility on prior urine cultures. Will also give 500 ml NS bolus for now and consult Nephrology to assist in managing hydration status and to make them aware pt is in ED.    Sepsis  - BCx2  - Ampicilin 2g q6 x7d    Prerenal vs transplant induced AKI  - 500 ml NS bolus  - Nephrology consult    ED Course:  1741: Repeat K 4.5 wnl.     1843: CT abdomen and pelvis remarkable for acute cystitis, negative for pyelonephritis.    1930: Spoke with Nephrology, agree with plan to admit. Suspect AKI related to volume depletion. Ok to fluid resuscitate with bolus or gtt. If Cr improves, AKI most likely prerenal. Will initiate Cefepime and add Vanc x1 for empiric after BCx2 drawn. Plan to hold Mycophenolate and continue Envarsus. Will treat COVID infection with monoclonal antibodies.     2030: Admitted to Med B    2145: Gave 1L LR bolus. Will recheck K at 2230          _____________________________________________________________________    The case was discussed with attending physician who is in agreement with the above assessment and plan    Dictation software was used while making this note. Please excuse any errors made with dictation software.    Additional Medical Decision Making     I have reviewed the vital signs and the nursing notes. Labs and radiology results that were available during my care of the patient were independently reviewed by me and considered in my medical decision making.     I independently visualized the EKG tracing if performed  I independently visualized the radiology images if performed  I reviewed the patient's prior medical records if available.  Additional history obtained from family if available    History     CHIEF COMPLAINT:   Chief Complaint   Patient presents with   ??? Abdominal Pain       HPI:  Elaine Koch is a 35 y.o. 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 In ok health prior to fecal transplant, though had runny nose and congestion. Tested COVID neg prior to transplant. After transplant began experiencing BLQ abd pain with nausea, vomiting, and poor PO. No diarrhea or constipation, last stool this AM and was normal. Also reports bilat flank pain with dysuria, increased frequency, and urgency. Denies fevers, chills, night sweats, bloody stools, hematuria, CP, SOB, cough, or rashes. No recent travel, sick contacts, new foods, or medications.      PAST MEDICAL HISTORY/PAST SURGICAL HISTORY:   Past Medical History:   Diagnosis Date   ??? Chronic hypertension during pregnancy, antepartum 07/22/2015    Overview:  Methyldopa recommended per nephrologist if needed   ??? Diabetes mellitus type 1 (CMS-HCC)    ??? ESRD (end stage renal disease) (CMS-HCC)    ??? History of pre-eclampsia 10/24/2016   ??? 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 AV FISTULA PLACEMENT  2018   ??? CESAREAN SECTION     ??? PR COLONOSCOPY FLX DX W/COLLJ SPEC WHEN PFRMD N/A 02/14/2020    Procedure: COLONOSCOPY, FLEXIBLE, PROXIMAL TO SPLENIC FLEXURE; DIAGNOSTIC, W/WO COLLECTION SPECIMEN BY BRUSH OR WASH;  Surgeon: Carmon Ginsberg, MD;  Location: [facility] PROC[facility]URES [facility];  Service: Gastroenterology   ??? PR FECAL MICROBIOTA PREP INSTIL N/A 02/14/2020    Procedure: PREP W INSTILLATION FECAL MICROBIOTA, ANY METHOD;  Surgeon: Carmon Ginsberg, MD;  Location: GI [facility]URES [facility];  Service: Gastroenterology   ??? PR TRANSPLANT ALLOGRAFT PANCREAS N/A 05/03/2019    Procedure: TRANSPLANTATION OF PANCREATIC ALLOGRAFT;  Surgeon: Leona Carry, MD;  Location: MAIN OR [facility];  Service: Transplant   ??? PR TRANSPLANT,PREP CADAVER RENAL GRAFT N/A 05/03/2019    Procedure: [facility] STD PREP CAD DONR RENAL ALLOGFT PRIOR TO TRNSPLNT, INCL DISSEC/REM PERINEPH FAT, DIAPH/RTPER ATTAC;  Surgeon: Leona Carry, MD;  Location: MAIN OR [facility];  Service: Transplant   ??? PR TRANSPLANT,PREP DONOR PANCREAS N/A 05/03/2019    Procedure: [facility] STANDARD PREPARATION OF CADAVER DONOR PANCREAS ALLOGRAFT PRIOR TO TRANSPLANTATION;  Surgeon: Leona Carry, MD;  Location: MAIN OR [facility];  Service: Transplant   ??? PR TRANSPLANTATION OF KIDNEY N/A 05/03/2019    Procedure: RENAL ALLOTRANSPLANTATION, IMPLANTATION OF GRAFT; WITHOUT RECIPIENT NEPHRECTOMY;  Surgeon: Leona Carry, MD;  Location: MAIN OR [facility];  Service: Transplant   ??? TONSILLECTOMY         MEDICATIONS:     Current Facility-Administered Medications:   ???  cefepime (MAXIPIME) 2 g in dextrose 100 mL IVPB (premix), 2 g, Intravenous, Once, Laurance Flatten, MD    Current Outpatient Medications:   ???  aspirin (ECOTRIN) 81 MG tablet, Take 1 tablet (81 mg total) by mouth daily., Disp: 30 tablet, Rfl: 11  ???  biotin 5 mg tablet, Take 1 tablet (5 mg total) by mouth daily., Disp: 30 tablet, Rfl: 11  ???  cholecalciferol, vitamin D3-125 mcg, 5,000 unit,, 125 mcg (5,000 unit) capsule, Take 1 capsule (125 mcg total) by mouth daily., Disp: 100 capsule, Rfl: 10  ???  fidaxomicin (DIFICID) 200 mg tablet, Take 1 tablet (200 mg total) by  mouth every twelve (12) hours. (Patient not taking: Reported on 01/26/2020), Disp: 60 tablet, Rfl: 2  ???  multivitamin, prenatal, folic acid-iron, 27-1 mg Tab, Take 1 tablet by mouth daily., Disp: , Rfl:   ???  mycophenolate (MYFORTIC) 180 MG EC tablet, Take 2 tablets (360 mg total) by mouth Two (2) times a day., Disp: 120 tablet, Rfl: 11  ???  predniSONE (DELTASONE) 5 MG tablet, Take 1 tablet (5 mg total) by mouth daily., Disp: 30 tablet, Rfl: 11  ???  sodium bicarbonate 650 mg tablet, Take 2 tablets (1,300 mg total) by mouth Three (3) times a day., Disp: 180 tablet, Rfl: 3  ???  tacrolimus (ENVARSUS XR) 1 mg Tb24 extended release tablet, Take 1 tablet (1 mg total) by mouth daily with 5 (4 mg) tablets for daily dose of 21 mg total, Disp: 30 tablet, Rfl: 11  ???  tacrolimus (ENVARSUS XR) 4 mg Tb24 extended release tablet, Take 5 tablets (20 mg total) by mouth daily with 1 (1 mg) tablet for daily dose of 21 mg total, Disp: 540 tablet, Rfl: 3  ???  valGANciclovir (VALCYTE) 450 mg tablet, Take 2 tablets (900 mg total) by mouth Two (2) times a day., Disp: 120 tablet, Rfl: 11    ALLERGIES:   Iodinated contrast media, Nickel, Propranolol, Eye irrigating solution [ophthalmic irrigation solution], Iodine, Naltrexone, and Uni-cortrom    SOCIAL HISTORY:   Social History     Tobacco Use   ??? Smoking status: Never Smoker   ??? Smokeless tobacco: Never Used   Substance Use Topics   ??? Alcohol use: Never       FAMILY HISTORY:  Family History   Problem Relation Age of Onset   ??? Diabetes Mother    ??? Diabetes Father    ??? Cancer Maternal Grandmother    ??? Diabetes Maternal Grandfather    ??? Diabetes Paternal Grandmother           Review of Systems    A 10 point review of systems was performed and is negative other than positive elements noted in HPI   Constitutional: Negative for fever.  Eyes: Negative for visual changes.  ENT: Negative for sore throat.  Cardiovascular: No chest pain.  Respiratory: Negative for shortness of breath.  Gastrointestinal: Positive for abdominal pain, vomiting. Negative for diarrhea.  Genitourinary: Positive for dysuria, increased frequency and urgency  Musculoskeletal: Positive for bilateral flank pain.  Skin: Negative for rash.  Neurological: Negative for headaches, focal weakness or numbness.    Physical Exam     VITAL SIGNS:    BP 123/70  - Pulse 101  - Resp 16  - SpO2 99%     Constitutional: Alert and oriented. Ill appearing but in no distress.  Eyes: Conjunctivae are normal.  ENT       Head: Normocephalic and atraumatic.       Nose: No congestion.       Mouth/Throat: Mucous membranes are moist.       Neck: No stridor.  Cardiovascular: Normal rate, regular rhythm. 2+ radial pulses equal bilaterally. 2-3 second cap refill.  Respiratory: Normal respiratory effort. Breath sounds are normal.  Gastrointestinal: Soft, tender to palpation at BLLQ, +BS, no masses or hepatosplenomegaly.   Genitourinary: No suprapubic tenderness  Musculoskeletal: Normal range of motion in all extremities.   Neurologic: Normal speech and language. No gross focal neurologic deficits are appreciated.  Skin: Skin is warm, dry. No rash noted.  Psychiatric: Mood and affect are normal. Speech and  behavior are normal.         Radiology     CT Abdomen Pelvis Without Any  Contrast    (Results Pending)     ECG 12 lead (Adult)    Result Date: 02/17/2020  SINUS TACHYCARDIA POSSIBLE LEFT ATRIAL ENLARGEMENT BORDERLINE ECG WHEN COMPARED WITH ECG OF 01-Oct-2019 23:54, NO SIGNIFICANT CHANGE WAS FOUND          Pertinent labs & imaging results that were available during my care of the patient were reviewed by me and considered in my medical decision making (see chart for details).      Wenda Overland, MD  Internal Medicine & Pediatrics, PGY-1  [facility]  Pager [phone]       Christena Flake, MD  Resident  02/17/20 2252       Christena Flake, MD  Resident  02/17/20 [phone]

## 2020-02-18 NOTE — Unmapped (Signed)
Vancomycin Therapeutic Monitoring Pharmacy Note    Elaine Koch is a 35 y.o. female starting vancomycin. Date of therapy initiation: 02/17/20    Indication: Bacteremia/Sepsis    Prior Dosing Information: Received 1500mg  in ED     Goals:  Therapeutic Drug Levels  Vancomycin trough goal: 15-20 mg/L    Additional Clinical Monitoring/Outcomes  Renal function, volume status (intake and output)    Results: Not applicable    Wt Readings from Last 1 Encounters:   01/26/20 80.6 kg (177 lb 9.6 oz)     Creatinine   Date Value Ref Range Status   02/17/2020 2.41 (H) 0.60 - 0.80 mg/dL Final   16/11/9602 5.40 (H) 0.57 - 1.00 mg/dL Final   98/12/9145 8.29 (H) 0.57 - 1.00 mg/dL Final        Pharmacokinetic Considerations and Significant Drug Interactions:  ??? Adult (estimated initial): Vd = 57.2 L, ke = 0.0337 hr-1  ??? Concurrent nephrotoxic meds: not applicable    Assessment/Plan:  Recommendation(s)  ??? Start vancomycin 1250mg  IV every 24 hours  ??? Estimated trough on recommended regimen: 18 mg/L    Follow-up  ??? Level due: prior to fourth or fifth dose  ??? A pharmacist will continue to monitor and order levels as appropriate    Please page service pharmacist with questions/clarifications.    [place] K Leyani Gargus, PharmD

## 2020-02-18 NOTE — Unmapped (Signed)
Nephrology (MEDB) History & Physical    This note was written with the assistance of medical student Elaine Koch. I attest that I have reviewed the student note and that the components of the history of the present illness, the physical exam, and the assessment and plan documented were performed by me or were performed in my presence by the student where I verified the documentation and performed (or re-performed) the exam and medical decision making.    Elaine Koch, PGY3    Assessment & Plan:   Elaine Koch is a 35 y.o. 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 Febrile on arrival and found to be COVID positive with no respiratory symptoms.    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 *    AKI on renal transplant (04/2019): Creatinine in the ED 2.41 compared to her baseline levels in the 1.4 range. AKI likely secondary to dehydration from vomiting and diarrhea and poor PO intake given lack of any new nephrotoxic meds. May also represent transplant rejection given her other systemic symptoms, however this is unlikely. Will follow her creatinine for improvement after hydration.   -- IV fluids (s/p 1.5L in ED), trend Cr closely  -- Renal transplant u/s   -- immunosuppression: cont tac envarsus 21 mg daily, HOLD pred 5 daily, HOLD myfortic per nephro fellow  -- Involve transplant nephrology team  -- Avoid nephrotoxic meds and contrasted studies if possible    Pyleonephritis: Dysuria and UA with large leuk esterase, nitrites, and bacteria likely represents an active UTI and likely pyelonephritis given back and abdominal pain and fever and tachycardia. Not septic at this time given normal blood pressure and lactate, however she is tachycardic to the low 100s and febrile to 39. Prior urine cultures have grown Enterococcus faecalis. Will initiate antibiotics for presumed pyelonephritis.  -- Received IV Vancomycin and Cefepime in ED. Transition to IV ampicillin given stable and likely E faecalis UTI.  -- f/u urine culture    COVID in immunosuppressed: Found to be COVID+ in the ED. Vaccinated x3, most recent in 10/2019. Febrile to 39C and tachycardic but stable from a respiratory standpoint with good O2 sats. No respiratory symptoms at present. CXR read with LLL opacity but not impressive. 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   -- ID consultation for consideration of therapies (monoclonal antibody?)  -- Tylenol PRN for fever  -- Monitor respiratory status closely    Abdominal pain, nausea/vomiting: GI sxs for last 2 days. Not able to keep pills down in ED. Likely related to COVID infection.  -- IVF as above  -- antiemetics PRN    History of C diff: S/p fecal transplant 02/14/20. No diarrhea at present.    Hx T1DM: Not currently using insulin or any DM meds. Glucoses have been WNL since pancreatic transplant.    Daily Checklist:  Diet: Regular Diet  DVT PPx: Heparin 5000units q8h  Electrolytes: No Repletion Needed  Code Status: Full Code    Chief Concern:  AKI (acute kidney injury) (CMS-HCC)    Subjective:   HPI:  Elaine Koch is a 35 y.o. female with PMHx of T1DM, hypotension, recurrent UTIs, kidney and pacreas transplant in 04/2019, and recent c. 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     The patient currently reports continued lower abdominal pain, leg cramping, and fatigue. She states that she feels feverish and is having chills. These symptoms feel very similar to her prior UTIs, however she has not had a UTI in some time now. She has not had any loose stools since her fecal transplant three days ago but had significant diarrhea prior to that. She also reports minimal PO intake over the past few days and has had a hard time keeping PO meds down. She was able to take her morning meds today. She states that she did not have any fevers until she got to the ED today. She denies any cough, congestion, chest pain, shortness of breath. She is breathing well on room air. She has received all three doses of her COVID doses. She does not know of any COVID exposures that she may have had.    T 39.0, mildly tachycardic with otherwise stable vitals. Labs notable for Cr 2.4 (bl 1.4). normal lactate, COVID PCR+ (cycle time 18). Urinalysis with large LE, +nitrite, 3 WBC. CXR with mild left basilar opacity. CT abdomen did not reveal any acute intraabdominal process. She was given LR in the ED as well as Cefepime and Vancomycin.    Designated Chiropodist Maker:  Elaine Koch currently has decisional capacity for healthcare decision-making and is able to designate a surrogate healthcare decision maker. Elaine Koch designated healthcare decision maker(s) is/are Elaine Koch (the patient's mother) as denoted by stated patient preference.    Allergies:  Iodinated contrast media, Nickel, Propranolol, Eye irrigating solution [ophthalmic irrigation solution], Iodine, Naltrexone, and Uni-cortrom    Medications:   Prior to Admission medications    Medication Dose, Route, Frequency   aspirin (ECOTRIN) 81 MG tablet 81 mg, Oral, Daily (standard)   biotin 5 mg tablet 5 mg, Oral, Daily   cholecalciferol, vitamin D3-125 mcg, 5,000 unit,, 125 mcg (5,000 unit) capsule 125 mcg, Oral, Daily (standard)   fidaxomicin (DIFICID) 200 mg tablet 200 mg, Oral, Every 12 hours  Patient not taking: Reported on 01/26/2020   multivitamin, prenatal, folic acid-iron, 27-1 mg Tab 1 tablet, Oral, Daily (standard)   mycophenolate (MYFORTIC) 180 MG EC tablet 360 mg, Oral, 2 times a day (standard)   predniSONE (DELTASONE) 5 MG tablet 5 mg, Oral, Daily (standard)   sodium bicarbonate 650 mg tablet 1,300 mg, Oral, 3 times a day (standard)   tacrolimus (ENVARSUS XR) 1 mg Tb24 extended release tablet Take 1 tablet (1 mg total) by mouth daily with 5 (4 mg) tablets for daily dose of 21 mg total   tacrolimus (ENVARSUS XR) 4 mg Tb24 extended release tablet Take 5 tablets (20 mg total) by mouth daily with 1 (1 mg) tablet for daily dose of 21 mg total   valGANciclovir (VALCYTE) 450 mg tablet 900 mg, Oral, 2 times a day (standard)       Medical History:  Past Medical History:   Diagnosis Date   ??? Chronic hypertension during pregnancy, antepartum 07/22/2015    Overview:  Methyldopa recommended per nephrologist if needed   ??? Diabetes mellitus type 1 (CMS-HCC)    ???  ESRD (end stage renal disease) (CMS-HCC)    ??? History of pre-eclampsia 10/24/2016   ??? 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 Stage 5 chronic kidney disease on chronic dialysis (CMS-HCC) 10/24/2016       Surgical History:  Past Surgical History:   Procedure Laterality Date   ??? AV FISTULA PLACEMENT  2018   ??? CESAREAN SECTION     ??? PR COLONOSCOPY FLX DX W/COLLJ SPEC WHEN PFRMD N/A 02/14/2020    Procedure: COLONOSCOPY, FLEXIBLE, PROXIMAL TO SPLENIC FLEXURE; DIAGNOSTIC, W/WO COLLECTION SPECIMEN BY BRUSH OR WASH;  Surgeon: Carmon Ginsberg, MD;  Location: GI [facility]URES [facility];  Service: Gastroenterology   ??? PR FECAL MICROBIOTA PREP INSTIL N/A 02/14/2020    Procedure: PREP W INSTILLATION FECAL MICROBIOTA, ANY METHOD;  Surgeon: Carmon Ginsberg, MD;  Location: GI [facility]URES [facility];  Service: Gastroenterology   ??? PR TRANSPLANT ALLOGRAFT PANCREAS N/A 05/03/2019    Procedure: TRANSPLANTATION OF PANCREATIC ALLOGRAFT;  Surgeon: Leona Carry, MD;  Location: MAIN OR [facility];  Service: Transplant   ??? PR TRANSPLANT,PREP CADAVER RENAL GRAFT N/A 05/03/2019    Procedure: [facility] STD PREP CAD DONR RENAL ALLOGFT PRIOR TO TRNSPLNT, INCL DISSEC/REM PERINEPH FAT, DIAPH/RTPER ATTAC;  Surgeon: Leona Carry, MD;  Location: MAIN OR [facility];  Service: Transplant   ??? PR TRANSPLANT,PREP DONOR PANCREAS N/A 05/03/2019    Procedure: [facility] STANDARD PREPARATION OF CADAVER DONOR PANCREAS ALLOGRAFT PRIOR TO TRANSPLANTATION;  Surgeon: Leona Carry, MD;  Location: MAIN OR [facility];  Service: Transplant   ??? PR TRANSPLANTATION OF KIDNEY N/A 05/03/2019    Procedure: RENAL ALLOTRANSPLANTATION, IMPLANTATION OF GRAFT; WITHOUT RECIPIENT NEPHRECTOMY;  Surgeon: Leona Carry, MD;  Location: MAIN OR [facility];  Service: Transplant   ??? TONSILLECTOMY         Family History:   Family History   Problem Relation Age of Onset   ??? Diabetes Mother    ??? Diabetes Father    ??? Cancer Maternal Grandmother    ??? Diabetes Maternal Grandfather    ??? Diabetes Paternal Grandmother        Social History:  Social History     Tobacco Use   ??? Smoking status: Never Smoker   ??? Smokeless tobacco: Never Used   Vaping Use   ??? Vaping Use: Never used   Substance Use Topics   ??? Alcohol use: Never   ??? Drug use: Never        Review of Systems:  10 systems were reviewed and are negative unless otherwise mentioned in the HPI    Objective:   Physical Exam:  Temp:  [38.3 ??C-39 ??C] 39 ??C  Heart Rate:  [101-109] 109  SpO2 Pulse:  [109] 109  Resp:  [16-24] 24  BP: (123-183)/(70-85) 183/85  SpO2:  [97 %-99 %] 97 %    Gen: WDWN female in NAD, answers questions appropriately  Heart: Tachycardic rate with regular rhythm, S1, S2, no M/R/G, no chest wall tenderness  Lungs: CTAB anteriorly, no crackles or wheezes, no use of accessor musclesy  Abdomen: Normoactive bowel sounds, soft, mildly tender in bilateral lower quadrants including over transplant kidney, no rebound/guarding  Extremities: no clubbing, cyanosis, or edema: pulses are +2 in bilateral upper and lower extremities  Neuro: CN II-XI grossly intact. No focal deficits.  Skin:  No rashes, lesions on clothed exam  Psych: Alert, oriented, normal mood and affect.     Labs/Studies/Imaging:  Labs, Studies, Imaging from the last 24hrs per  EMR and personally reviewed    Park Breed, MS3

## 2020-02-19 MED ADMIN — remdesivir (VEKLURY) 100 mg in sodium chloride (NS) 0.9 % 295 mL IVPB: 100 mg | INTRAVENOUS | @ 15:00:00 | Stop: 2020-02-21

## 2020-02-19 MED ADMIN — sodium bicarbonate tablet 1,300 mg: 1300 mg | ORAL | @ 19:00:00 | Stop: 2020-02-22

## 2020-02-19 MED ADMIN — heparin (porcine) 5,000 unit/mL injection 5,000 Units: 5000 [IU] | SUBCUTANEOUS | @ 12:00:00 | Stop: 2020-02-22

## 2020-02-19 MED ADMIN — cefepime (MAXIPIME) 1 g in sodium chloride 0.9 % (NS) 100 mL IVPB-connector bag: 1 g | INTRAVENOUS | @ 15:00:00 | Stop: 2020-02-19

## 2020-02-19 MED ADMIN — heparin (porcine) 5,000 unit/mL injection 5,000 Units: 5000 [IU] | SUBCUTANEOUS | @ 03:00:00 | Stop: 2020-02-22

## 2020-02-19 MED ADMIN — sodium bicarbonate tablet 1,300 mg: 1300 mg | ORAL | @ 14:00:00 | Stop: 2020-02-22

## 2020-02-19 MED ADMIN — tacrolimus (ENVARSUS XR) extended release tablet 21 mg: 21 mg | ORAL | @ 15:00:00 | Stop: 2020-02-20

## 2020-02-19 MED ADMIN — predniSONE (DELTASONE) tablet 5 mg: 5 mg | ORAL | @ 14:00:00 | Stop: 2020-02-22

## 2020-02-19 MED ADMIN — ondansetron (ZOFRAN) injection 4 mg: 4 mg | INTRAVENOUS | @ 05:00:00 | Stop: 2020-02-22

## 2020-02-19 MED ADMIN — ondansetron (ZOFRAN) injection 4 mg: 4 mg | INTRAVENOUS | @ 12:00:00 | Stop: 2020-02-22

## 2020-02-19 MED ADMIN — cefepime (MAXIPIME) 1 g in sodium chloride 0.9 % (NS) 100 mL IVPB-connector bag: 1 g | INTRAVENOUS | @ 05:00:00 | Stop: 2020-02-19

## 2020-02-19 MED ADMIN — fidaxomicin (DIFICID) tablet 200 mg: 200 mg | ORAL | @ 15:00:00 | Stop: 2020-02-21

## 2020-02-19 MED ADMIN — aspirin chewable tablet 81 mg: 81 mg | ORAL | @ 14:00:00 | Stop: 2020-02-22

## 2020-02-19 MED ADMIN — casirivimab-imdevimab co-formulated 600 mg in sodium chloride (NS) 0.9 % 110 mL IVPB co-formulated: 600 mg | INTRAVENOUS | @ 03:00:00 | Stop: 2020-02-18

## 2020-02-19 MED ADMIN — acetaminophen (TYLENOL) suppository 325 mg: 325 mg | RECTAL | Stop: 2020-02-22

## 2020-02-19 MED ADMIN — heparin (porcine) 5,000 unit/mL injection 5,000 Units: 5000 [IU] | SUBCUTANEOUS | @ 19:00:00 | Stop: 2020-02-22

## 2020-02-19 MED ADMIN — lactated Ringers infusion: 100 mL/h | INTRAVENOUS | @ 19:00:00 | Stop: 2020-02-20

## 2020-02-19 NOTE — Unmapped (Signed)
Pt has been drowsy, but arouses easily. Pt was febrile at the beginning of the shift, but came down to 36.7. VSS; sating 96% on RA. C/o vomiting ; treated with IV Zofran with good effect. IV Casirivimab-imdevimab given without any reaction. Bed locked in the lowest position with two side rails up. Pt is free from fall/injury; will continue to monitor.    Problem: Adult Inpatient Plan of Care  Goal: Plan of Care Review  Outcome: Progressing  Goal: Patient-Specific Goal (Individualized)  Outcome: Progressing  Goal: Absence of Hospital-Acquired Illness or Injury  Outcome: Progressing  Intervention: Identify and Manage Fall Risk  Recent Flowsheet Documentation  Taken 02/19/2020 0400 by Leisa Lenz, RN  Safety Interventions:  ??? low bed  ??? fall reduction program maintained  Taken 02/19/2020 0200 by Leisa Lenz, RN  Safety Interventions:  ??? low bed  ??? fall reduction program maintained  Taken 02/19/2020 0000 by Leisa Lenz, RN  Safety Interventions:  ??? low bed  ??? fall reduction program maintained  Taken 02/18/2020 2200 by Leisa Lenz, RN  Safety Interventions:  ??? low bed  ??? fall reduction program maintained  Taken 02/18/2020 2000 by Leisa Lenz, RN  Safety Interventions: low bed  Intervention: Prevent and Manage VTE (Venous Thromboembolism) Risk  Recent Flowsheet Documentation  Taken 02/18/2020 2000 by Leisa Lenz, RN  Activity Management: activity adjusted per tolerance  Intervention: Prevent Infection  Recent Flowsheet Documentation  Taken 02/18/2020 2000 by Leisa Lenz, RN  Infection Prevention: hand hygiene promoted  Goal: Optimal Comfort and Wellbeing  Outcome: Progressing  Goal: Readiness for Transition of Care  Outcome: Progressing  Goal: Rounds/Family Conference  Outcome: Progressing     Problem: Infection  Goal: Absence of Infection Signs and Symptoms  Outcome: Progressing  Intervention: Prevent or Manage Infection  Recent Flowsheet Documentation  Taken 02/18/2020 2000 by Leisa Lenz, RN  Infection Management: aseptic technique maintained  Isolation Precautions: droplet precautions maintained     Problem: LTC COVID-19 Confirmed or Rule-Out  Goal: Patient/Resident will remain free of complications due to COVID-19  Description: 1. Review and update the patient/resident's isolation status in the Isolation activity  2. Keep the patient/resident's door closed at all times and limit movement of the patient/resident outside of the room to medically essential purposes. If applicable, transfer patient/resident to a single-person room   3. Educate and reinforce infection prevention and control practices recommended by CDC  4. Frequently monitor for development of more severe symptoms   5. Use appropriate PPE when providing care for patient/resident  6. Reinforce no visitor policy and non-essential health care personnel policy, except for certain compassionate care situations  7. If worsening of symptoms occur, alert the nearest Hospital caring for confirmed COVID-19 patients and arrange for transfer with proper precautions including placing a facemask on the patient/resident during transfer  8. Communicate information about known or suspected case of COVID-19 to appropriate public health personnel  9. Avoid procedures that are likely to induce coughing (e.g., sputum induction, open suctioning of airways). If required, do so in an Airborne Infection Isolation Room. The health care provider in the room should wear an N95 or higher-level respirator, eye protection, gloves, and a gown. The number of HCP present during the procedure should be limited to only those essential for patient/resident care and procedure support. Visitors should not be present for the procedure. Clean and disinfect procedure room surfaces promptly  10. Update patient/resident and family/representatives as needed  Outcome: Progressing

## 2020-02-19 NOTE — Unmapped (Signed)
Patient is alert and oriented and able to make her needs known. VSS and in RA to maintain SPO2>95%. CO nausea but she refuse to take PRN medicines. Loss of appetite, RN encouraged to eat. LR @100ml /hr is continuously infusing. IV antibiotics and Remdesevir infused this shift, no sign of distress noticed. Special Airborne isolation maintained for confirmed COVID. She can ambulate herself to the bathroom. Remained free from fall and will continue to monitor her closely.   Problem: Adult Inpatient Plan of Care  Goal: Plan of Care Review  Outcome: Progressing  Goal: Patient-Specific Goal (Individualized)  Outcome: Progressing  Goal: Absence of Hospital-Acquired Illness or Injury  Outcome: Progressing  Intervention: Identify and Manage Fall Risk  Recent Flowsheet Documentation  Taken 02/19/2020 0800 by Milinda Pointer, RN  Safety Interventions:   fall reduction program maintained   low bed  Intervention: Prevent and Manage VTE (Venous Thromboembolism) Risk  Recent Flowsheet Documentation  Taken 02/19/2020 0800 by Milinda Pointer, RN  Activity Management: activity adjusted per tolerance  VTE Prevention/Management: anticoagulant therapy  Goal: Optimal Comfort and Wellbeing  Outcome: Progressing  Goal: Readiness for Transition of Care  Outcome: Progressing  Goal: Rounds/Family Conference  Outcome: Progressing     Problem: Infection  Goal: Absence of Infection Signs and Symptoms  Outcome: Progressing  Intervention: Prevent or Manage Infection  Recent Flowsheet Documentation  Taken 02/19/2020 0800 by Milinda Pointer, RN  Infection Management: aseptic technique maintained  Isolation Precautions: (special airborn for confirmed COVID)   airborne precautions maintained   contact precautions maintained   droplet precautions maintained     Problem: LTC COVID-19 Confirmed or Rule-Out  Goal: Patient/Resident will remain free of complications due to COVID-19  Description: 1. Review and update the patient/resident's isolation status in the Isolation activity  2. Keep the patient/resident's door closed at all times and limit movement of the patient/resident outside of the room to medically essential purposes. If applicable, transfer patient/resident to a single-person room   3. Educate and reinforce infection prevention and control practices recommended by CDC  4. Frequently monitor for development of more severe symptoms   5. Use appropriate PPE when providing care for patient/resident  6. Reinforce no visitor policy and non-essential health care personnel policy, except for certain compassionate care situations  7. If worsening of symptoms occur, alert the nearest Hospital caring for confirmed COVID-19 patients and arrange for transfer with proper precautions including placing a facemask on the patient/resident during transfer  8. Communicate information about known or suspected case of COVID-19 to appropriate public health personnel  9. Avoid procedures that are likely to induce coughing (e.g., sputum induction, open suctioning of airways). If required, do so in an Airborne Infection Isolation Room. The health care provider in the room should wear an N95 or higher-level respirator, eye protection, gloves, and a gown. The number of HCP present during the procedure should be limited to only those essential for patient/resident care and procedure support. Visitors should not be present for the procedure. Clean and disinfect procedure room surfaces promptly  10. Update patient/resident and family/representatives as needed      Outcome: Progressing

## 2020-02-19 NOTE — Unmapped (Signed)
Nephrology ([facility]) Progress Note    Assessment & Plan:   Elaine Koch is a 35 y.o. 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 Febrile on arrival and found to be COVID positive with no respiratory symptoms.    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 *      AKI on renal transplant (04/2019): Creatinine in the ED 2.41 compared to her baseline levels in the 1.4 range. AKI likely secondary to dehydration from vomiting and diarrhea and poor PO intake given lack of any new nephrotoxic meds. Low suspicion for transplant rejection and no abnormalities on renal transplant ultrasound. Received some fluids on admission with mild improvement in Cr (2.34). AKI may be related to ongoing infections (COVID, bacteremia, UTI)    - Continuous IVF ordered  - Continue to trend creatinine  - Treatment for infections as below  - Avoid nephrotoxic medications  - Immunosuppression:     - Tacrolimus per pharmacy, continue 21mg  daily. Transition to sublingual 1/9 if cannot tolerate PO (Trough goal 5-6)    - Continue Prednisone 5mg  daily, hold Myfortic  - Valganciclovir ppx continued    GNR Bacteremia ( Proteus Mirabilis)   Blood cultures from admission 2/2+ for gram negative rods now proteus. Potential sources include UTI although she generally has grown Enterococcus in urine. ICID also raised concern that source may be from recent fecal transplant although unclear at this time. Pt remains hemodynamically stable.  - ICID following    - Continue Cefepime, no need for Ampicillin at this time    UTI/Pyelonephritis  Dysuria and UA with large leuk esterase, nitrites, and bacteria likely represents an active UTI and likely pyelonephritis given back and abdominal pain and fever and tachycardia. Not septic at this time given normal blood pressure and lactate, however she is tachycardic to the low 100s  Prior urine cultures have grown Enterococcus faecalis. Received dose of Vancomycin and Cefepime in ED as well as ampicillin. Urine culture w/ >100k Proteus    - F/u UCx sensitivities  - Continue Cefepime as above    COVID in immunosuppressed: Found to be COVID+ in the ED. Vaccinated x3, most recent in 10/2019. Febrile to 39C and tachycardic but stable from a respiratory standpoint with good O2 sats. No respiratory symptoms at present. CXR read with LLL opacity but not impressive. 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 ICID consulted who provided recommendations for COVID therapies  - Remdesivir 200mg  x1 followed by 100mg  daily, will f/u on final course    - Daily monitoring of LFTs and Cr during therapy    - Baseline CRP ordered  - Monoclonal antibody ordered and received   - F/u Ddimer for anticoagulation plan        Abdominal pain, nausea/vomiting(improving): GI sxs for last 2 days. Not able to keep pills down in ED. Likely related to COVID infection.  -- IVF as above  -- antiemetics PRN  ??  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 No diarrhea  at present.  - PO Fidaxomicin per ICID  ??  Hx T1DM: Not currently using insulin or any DM meds. Glucoses have been WNL since pancreatic transplant.      Daily Checklist:  Diet: Regular Diet  DVT PPx: Heparin 5000units q8h  Electrolytes: Replete Potassium to >/=4 and Magnesium to >/=2  Code Status: Full Code  Dispo: Home    Team Contact Information:   Primary Team: Nephrology ([facility])  Primary Resident: Louie Bun, MD  Resident's Pager: [phone] (Nephrology Intern - Blue)    Interval History:   No acute events overnight. Pt continues to feel unwell and have issues with nausea prohibiting PO medications. Continues to have fevers but remains HDS    All other systems were reviewed and are negative except as noted in the HPI    Objective:   Temp:  [35.6 ??C-39.3 ??C] 35.6 ??C  Heart Rate:  [89-101] 98  SpO2 Pulse:  [91-96] 92  Resp:  [17-26] 19  BP: (134-183)/(81-97) 156/88  SpO2:  [94 %-98 %] 95 %    Gen: WDWN female in NAD, answers questions appropriately  Heart: Tachycardic rate with regular rhythm, S1, S2, no M/R/G, no chest wall tenderness  Lungs: CTAB anteriorly, no crackles or wheezes, no use of accessory muscles  Abdomen: Normoactive bowel sounds, soft, mildly tender in bilateral lower quadrants including over transplant kidney, no rebound/guarding  Extremities: no clubbing, cyanosis, or edema: pulses are +2 in bilateral upper and lower extremities  Neuro: CN II-XI grossly intact. No focal deficits.  Skin:  No rashes, lesions on clothed exam  Psych: Alert, oriented, normal mood and affect.   ??    Labs/Studies: Labs and Studies from the last 24hrs per EMR and Reviewed

## 2020-02-19 NOTE — Unmapped (Signed)
IMMUNOCOMPROMISED HOST INFECTIOUS DISEASE CONSULT NOTE      Elaine Koch is being seen in consultation at the request of Vimal Elease Hashimoto, MD for evaluation of GNR bacteremia and COVID-19.    Assessment/Recommendations:    Elaine Koch is a 35 y.o. 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  Pertinent Co-morbidities  #T1DM  - diagnosed at age 24, c/b nephropathy and retinopathy  -?? 10/13/2019??A1C 4.3??  ??  #Sickle cell trait  ??  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    ??#mild COVID infection  - 02/17/20 COVID PCR positive with Ct 18.3    #Proteus mirabilis bacteremia, likely urinary source 02/17/20  - 1/6 presented febrile, back pain. 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 Fara Boros on 02/14/20  ??  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  # E. faecalis??UTI vs menstrual cramping (w/o bleeding)??10/01/19  - 8/20 fever T 38.3??but no dysuria, no suprapubic pain. Tender on LLQ over??renal??allograft  - 8/21 UA wbc 3;??8/21 UCx 50k-100k E. Faecalis??(R-doxy, S-amp, nitrofurantoin)  - 8/21 unremarkable renal US??(CT c/a/p, transvaginal US)  - s/p??linezolid 600mg  BID PO x7days 8/22-8/28       RECOMMENDATIONS FOR 02/19/2020    Diagnostic  ??? Fu repeat blood cx from 1/7 for clearance  ??? Please obtain LFTs with tmrw's blood draw given she is on remdesivir      Treatment    For Proteus bacteremia and Proteus UTI  ?? STOP cefepime, START ceftriaxone 2g q24h    For history of recurrent C. Diff, now s/p recent FMT  ??? CONTINUE po fidaxomicin 200 mg BID for C. Diff ppx while on broad spectrum abx    For ppx:  ??? Continue valganciclovir renally dosed equivalent of 900 mg daily (ppx dosing)    For mild COVID-19 infection:  ?? S/p cas/imde on 1/7  ?? Continue remdesivir (D1 1/7)                  The [facility] ID service will continue to follow.  Please page the ID Transplant/Liquid Oncology Fellow consult at [phone] with questions.  Patient discussed with Dr. Cardell Peach.      Interval events:  Febrile, Defervesced overnight (getting Tylenol). Remains on RA. Got cas/imde yesterday. Urine now with proteus. Feeling much better. Nausea improved. Back pain improved as well. Taking in some po.     Medications:   Antimicrobials:  Anti-infectives (From admission, onward)    Start  Dose/Rate Route Frequency Ordered Stop    02/19/20 0900  remdesivir (VEKLURY) 100 mg in sodium chloride (NS) 0.9 % 295 mL IVPB        Question Answer Comment   Has the patient/caregiver been given copy of EUA fact sheet? Not applicable - patient is >/=33 years of age AND >/= 40kg    Has the patient/caregiver been counseled on alternative therapies? Not applicable - patient is >/= 33 years of age AND >/= 40kg    Has the patient/caregiver been counseled that this medication is an unapproved drug? Not applicable - patient is >/= 33 years of age AND >/= 40kg    Lab confirmed SARS-CoV-2 infection? Yes    Does patient have symptoms consistent with SARS-CoV-2 infection? Yes    Hospitalization less than or equal to 10 days for COVID-19? Yes       Followed by Linked Group Details    100 mg  590 mL/hr over 30 Minutes Intravenous Daily (standard) 02/18/20 1544 02/27/20 0859    02/18/20 2100  fidaxomicin (DIFICID) tablet 200 mg         200 mg Oral Every 12 hours scheduled 02/18/20 1537 02/25/20 2059    02/18/20 1100  cefepime (MAXIPIME) 1 g in sodium chloride 0.9 % (NS) 100 mL IVPB-connector bag         1 g  200 mL/hr over 30 Minutes Intravenous Every 12 hours 02/18/20 1008 02/25/20 2359    02/17/20 2200  valGANciclovir (VALCYTE) tablet 450 mg         450 mg Oral Every 48 hours 02/17/20 2110            Current/Prior immunomodulators:  Tac  pred (held)  MMF (held)    Other medications reviewed.        Vital Signs last 24 hours:  Temp:  [35.6 ??C (96.1 ??F)-39.3 ??C (102.7 ??F)] 35.6 ??C (96.1 ??F)  Heart Rate:  [89-101] 98  SpO2 Pulse:  [92] 92  Resp:  [18-26] 19  BP: (134-183)/(82-97) 156/88  MAP (mmHg):  [101-126] 111  SpO2:  [94 %-98 %] 95 %    Physical Exam:  Patient Lines/Drains/Airways Status     Active Active Lines, Drains, & Airways     Name Placement date Placement time Site Days    Peripheral IV 02/17/20 Left Antecubital 02/17/20  1556  Antecubital  1    Arteriovenous Fistula - Vein Graft  Access Arteriovenous fistula Right;Upper Arm ???  ???  Arm                GEN:  looks well, no apparent distress  EYES: PERRL, EOMI  ZO:XWRUEA reate  PULM:normal work of breathing at rest  VW:UJWJ, no TTP, no flank pain  XB:JYNWGNFA  RECTAL:deferred  SKIN:no petechiae, ecchymoses or obvious rashes on clothed exam  NEURO:no tremor noted, facial expression symmetric and moves extremities equally  PSYCH:attentive, appropriate affect, good eye contact, fluent speech      Data for Medical Decision Making     Recent Labs   Lab Units 02/18/20  0554 02/17/20  2314 02/17/20  1741 02/17/20  1555   WBC 10*9/L 8.2  --   --  5.0   HEMOGLOBIN g/dL 21.3*  --   --  08.6 PLATELET COUNT (1) 10*9/L 207  --   --  251   NEUTRO ABS 10*9/L 7.6*  --   --  4.6   LYMPHO ABS 10*9/L 0.4*  --   --  0.3*   EOSINO  ABS 10*9/L 0.0  --   --  0.0   BUN mg/dL 21  --   --  19   CREATININE mg/dL 5.28*  --   --  4.13*   AST U/L  --   --  22  --    ALT U/L  --   --   --  29   BILIRUBIN TOTAL mg/dL  --   --   --  1.4*   ALK PHOS U/L  --   --   --  53   POTASSIUM WHOLE BLOOD mmol/L  --   --   --  6.9*   POTASSIUM mmol/L 4.6* 4.4 4.5  --    MAGNESIUM mg/dL 1.3*  --   --  1.5*   CALCIUM mg/dL 9.0  --   --  9.7       New Culture Data   Microbiology Results (last day)     Procedure Component Value Date/Time Date/Time    Urine Culture [[id number]]  (Abnormal)  (Susceptibility) Collected: 02/17/20 1749    Lab Status: Final result Specimen: Urine from Clean Catch Updated: 02/19/20 1435     Urine Culture, Comprehensive >100,000 CFU/mL Proteus mirabilis    Narrative:      Specimen Source: Clean Catch    Susceptibility     Proteus mirabilis (1)     Antibiotic Interpretation Microscan Method Status    Ampicillin Susceptible  MIC SUSCEPTIBILITY RESULT Final    Cefazolin Resistant  MIC SUSCEPTIBILITY RESULT Final    Cephalexin Susceptible  MIC SUSCEPTIBILITY RESULT Final     For uncomplicated UTI's only.  Also predicts response for cefdinir, cefpodoxime and cefuroxime.        Ceftazidime Susceptible  MIC SUSCEPTIBILITY RESULT Final    Ceftriaxone Susceptible  MIC SUSCEPTIBILITY RESULT Final    Ciprofloxacin Susceptible  MIC SUSCEPTIBILITY RESULT Final    Gentamicin Susceptible  MIC SUSCEPTIBILITY RESULT Final    Levofloxacin Susceptible  MIC SUSCEPTIBILITY RESULT Final    Nitrofurantoin Resistant  MIC SUSCEPTIBILITY RESULT Final    Piperacillin + Tazobactam Susceptible  MIC SUSCEPTIBILITY RESULT Final    Tetracycline Resistant  MIC SUSCEPTIBILITY RESULT Final     Organisms that test susceptible to tetracycline are considered susceptible to doxycycline.However, some organisms that test intermediate or resistant to tetracycline may be susceptible to doxycycline.       Tobramycin Susceptible  MIC SUSCEPTIBILITY RESULT Final    Trimethoprim + Sulfamethoxazole Susceptible  MIC SUSCEPTIBILITY RESULT Final                   Blood Culture #1 [[id number]]  (Abnormal) Collected: 02/17/20 2002    Lab Status: Preliminary result Specimen: Blood from 1 Peripheral Draw Updated: 02/19/20 1055     Blood Culture, Routine Proteus mirabilis     Gram Stain Result Gram negative rods (bacilli)    Blood Culture #2 [[id number]]  (Abnormal) Collected: 02/17/20 1950    Lab Status: Preliminary result Specimen: Blood from 1 Peripheral Draw Updated: 02/19/20 1054     Blood Culture, Routine Proteus mirabilis     Comment: Susceptibility Results to follow        Gram Stain Result Gram negative rods (bacilli)    Blood Culture #2 [[id number]] Collected: 02/18/20 1842    Lab Status: In process Specimen: Blood from 1 Peripheral Draw Updated: 02/18/20 1846    Blood Culture #1 [[id number]] Collected: 02/18/20 1842    Lab Status: In process Specimen: Blood from 1 Peripheral Draw Updated: 02/18/20 1846  Recent Studies   No results found.      Serologies:  Lab Results   Component Value Date    CMV IGG Positive (A) 05/02/2019    EBV VCA IgG Antibody Positive (A) 05/02/2019    Hep A IgG Nonreactive 03/26/2018    Hep B Surface Ag Nonreactive 05/02/2019    Hep B S Ab Reactive (A) 05/02/2019    Hep B Surf Ab Quant 209.45 (H) 05/02/2019    Hepatitis C Ab Nonreactive 05/02/2019    RPR Nonreactive 05/02/2019    HSV 1 IgG Positive (A) 05/02/2019    HSV 2 IgG Positive (A) 05/02/2019    Varicella IgG Positive 05/02/2019    Rubella IgG Scr Positive 03/26/2018    Toxoplasma Gondii IgG Negative 03/26/2018    Quantiferon TB Gold Plus Interpretation Negative 03/26/2018    Quantiferon Mitogen Minus Nil >10.00 03/26/2018    Quantiferon Antigen 1 minus Nil 0.00 03/26/2018       Immunizations:  Immunization History   Administered Date(s) Administered   ??? COVID-19 VACC,MRNA,(PFIZER)(PF)(IM) 04/29/2019, 08/06/2019, 10/13/2019   ??? HPV Quadrivalent (Gardasil) 05/28/2012   ??? Hepatitis B, Adult 12/10/2017   ??? Influenza Vaccine Quad (IIV4 PF) 61mo+ injectable 10/17/2015, 01/29/2019, 11/26/2019   ??? Influenza Virus Vaccine, unspecified formulation 11/12/2016, 01/31/2019   ??? PNEUMOCOCCAL POLYSACCHARIDE 23 12/20/2016   ??? TdaP 10/17/2015

## 2020-02-19 NOTE — Unmapped (Signed)
Care Management  Initial Transition Planning Assessment              General  Care Manager assessed the patient by : Medical record review  Orientation Level: Oriented X4     Pt is within 1 year of kidney transplant (05/03/2019) and will be followed by transplant SW team.  CM will assist w/ any home infusion needs as indicated.  Kidney Txp CM/SW can be reached at shared Inpatient Kidney SW [phone]      Contact/Decision Maker  Extended Emergency Contact Information  Primary Emergency Contact: Hicks,Dorothy   [place]  Home Phone: [phone]  Relation: Mother  Preferred language: ENGLISH  Interpreter needed? No    Legal Next of Kin / Guardian / POA / Advance Directives     HCDM (patient stated preference): Hicks,Dorothy - Mother - [phone]    Advance Directive (Medical Treatment)  Does patient have an advance directive covering medical treatment?: Patient does not have advance directive covering medical treatment.  Reason patient does not have an advance directive covering medical treatment:: Patient does not wish to complete one at this time.    Health Care Decision Maker [HCDM] (Medical & Mental Health Treatment)  Healthcare Decision Maker: Patient does not wish to appoint a Health Care Decision Maker at this time  Information offered on HCDM, Medical & Mental Health advance directives:: Patient declined information.    Advance Directive (Mental Health Treatment)  Does patient have an advance directive covering mental health treatment?: Patient does not have advance directive covering mental health treatment.  Reason patient does not have an advance directive covering mental health treatment:: Patient does not wish to complete one at this time.        Readmission  Risk of Unplanned Readmission Score: UNPLANNED READMISSION SCORE: 20%  Predictive Model Details          20% (Medium)  Factor Value    Calculated 02/19/2020 08:04 20% Number of active Rx orders 31    [place] Risk of Unplanned Readmission Model 18% Number of hospitalizations in last year 4     11% Number of ED visits in last six months 2     9% ECG/EKG order present in last 6 months     6% Imaging order present in last 6 months     6% Latest hemoglobin low (11.9 g/dL)     4% Diagnosis of deficiency anemia present     4% Active anticoagulant Rx order present     4% Active corticosteroid Rx order present     4% Latest creatinine high (2.34 mg/dL)     4% Diagnosis of renal failure present     3% Charlson Comorbidity Index 3     2% Age 35     2% Future appointment scheduled     1% Current length of stay 1.484 days     1% Active ulcer medication Rx order present      Readmitted Within the Last 30 Days? (No if blank)        Discharge Plan       Expected Discharge Date: 02/27/2020    Expected Transfer from Critical Care:                 Initial Assessment complete?: Yes

## 2020-02-19 NOTE — Unmapped (Signed)
Nephrology ([facility]) Progress Note    Assessment & Plan:   Elaine Koch is a 35 y.o. 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 Febrile on arrival and found to be COVID positive with no respiratory symptoms.    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 *      AKI on renal transplant (04/2019): Creatinine in the ED 2.41 compared to her baseline levels in the 1.4 range. AKI likely secondary to dehydration from vomiting and diarrhea and poor PO intake given lack of any new nephrotoxic meds. Low suspicion for transplant rejection and no abnormalities on renal transplant ultrasound. Received some fluids on admission with mild improvement in Cr (2.34). AKI may be related to ongoing infections (COVID, bacteremia, UTI)  - Continuous IVF ordered  - Continue to trend creatinine  - Treatment for infections as below  - Avoid nephrotoxic medications  - Immunosuppression:     - Tacrolimus per pharmacy, continue 21mg  daily. Transition to sublingual 1/9 if cannot tolerate PO (Trough goal 5-6)    - Continue Prednisone 5mg  daily, hold Myfortic  - Valganciclovir ppx continued    GNR Bacteremia  Blood cultures from admission 2/2+ for gram negative rods. Potential sources include UTI although she generally has grown Enterococcus in urine. ICID also raised concern that source may be from recent fecal transplant although unclear at this time. Pt remains hemodynamically stable although continues to fever  - ICID following  - F/u culture speciation  - Continue Cefepime, no need for Ampicillin at this time    UTI/Pyelonephritis  Dysuria and UA with large leuk esterase, nitrites, and bacteria likely represents an active UTI and likely pyelonephritis given back and abdominal pain and fever and tachycardia. Not septic at this time given normal blood pressure and lactate, however she is tachycardic to the low 100s and febrile to 39. Prior urine cultures have grown Enterococcus faecalis. Received dose of Vancomycin and Cefepime in ED as well as ampicillin. Urine culture w/ >100k Proteus  - F/u UCx sensitivities  - Continue Cefepime as above    COVID in immunosuppressed: Found to be COVID+ in the ED. Vaccinated x3, most recent in 10/2019. Febrile to 39C and tachycardic but stable from a respiratory standpoint with good O2 sats. No respiratory symptoms at present. CXR read with LLL opacity but not impressive. 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 ICID consulted who provided recommendations for COVID therapies  - Remdesivir 200mg  x1 followed by 100mg  daily, will f/u on final course    - Daily monitoring of LFTs and Cr during therapy    - Baseline CRP ordered  - Monoclonal antibody ordered  - F/u Ddimer for anticoagulation plan        Abdominal pain, nausea/vomiting: GI sxs for last 2 days. Not able to keep pills down in ED. Likely related to COVID infection.  -- IVF as above  -- antiemetics PRN  ??  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 No diarrhea at present.  -  PO Fidaxomicin per ICID  ??  Hx T1DM: Not currently using insulin or any DM meds. Glucoses have been WNL since pancreatic transplant.      Daily Checklist:  Diet: Regular Diet  DVT PPx: Heparin 5000units q8h  Electrolytes: Replete Potassium to >/=4 and Magnesium to >/=2  Code Status: Full Code  Dispo: Home    Team Contact Information:   Primary Team: Nephrology ([facility])  Primary Resident: Cathlyn Parsons, MD  Resident's Pager: [phone] (Nephrology Intern - Blue)    Interval History:   No acute events overnight. Pt continues to feel unwell and have issues with nausea prohibiting PO medications. Continues to have fevers but remains HDS    All other systems were reviewed and are negative except as noted in the HPI    Objective:   Temp:  [37.6 ??C-39.3 ??C] 39.3 ??C  Heart Rate:  [91-116] 101  SpO2 Pulse:  [91-116] 92  Resp:  [17-26] 26  BP: (134-183)/(74-97) 183/97  SpO2:  [94 %-99 %] 98 %    Gen: WDWN female in NAD, answers questions appropriately  Heart: Tachycardic rate with regular rhythm, S1, S2, no M/R/G, no chest wall tenderness  Lungs: CTAB anteriorly, no crackles or wheezes, no use of accessory muscles  Abdomen: Normoactive bowel sounds, soft, mildly tender in bilateral lower quadrants including over transplant kidney, no rebound/guarding  Extremities: no clubbing, cyanosis, or edema: pulses are +2 in bilateral upper and lower extremities  Neuro: CN II-XI grossly intact. No focal deficits.  Skin:  No rashes, lesions on clothed exam  Psych: Alert, oriented, normal mood and affect.   ??    Labs/Studies: Labs and Studies from the last 24hrs per EMR and Reviewed      Cathlyn Parsons, MD PGY-2  Pager [phone]  [facility]

## 2020-02-19 NOTE — Unmapped (Signed)
Pt has been drowsy, but arouses easily. Pt was febrile at the beginning of the shift, but came down to 36.7. VSS; sating 96% on RA. C/o vomiting ; treated with IV Zofran with good effect. IV Casirivimab-imdevimab given without any reaction. Bed locked in the lowest position with two side rails up. Pt is free from fall/injury; will continue to monitor.    Problem: Adult Inpatient Plan of Care  Goal: Absence of Hospital-Acquired Illness or Injury  Intervention: Identify and Manage Fall Risk  Recent Flowsheet Documentation  Taken 02/18/2020 2200 by Leisa Lenz, RN  Safety Interventions:  ??? low bed  ??? fall reduction program maintained  Taken 02/18/2020 2000 by Leisa Lenz, RN  Safety Interventions: low bed  Intervention: Prevent and Manage VTE (Venous Thromboembolism) Risk  Recent Flowsheet Documentation  Taken 02/18/2020 2000 by Leisa Lenz, RN  Activity Management: activity adjusted per tolerance  Intervention: Prevent Infection  Recent Flowsheet Documentation  Taken 02/18/2020 2000 by Leisa Lenz, RN  Infection Prevention: hand hygiene promoted     Problem: Infection  Goal: Absence of Infection Signs and Symptoms  Intervention: Prevent or Manage Infection  Recent Flowsheet Documentation  Taken 02/18/2020 2000 by Leisa Lenz, RN  Infection Management: aseptic technique maintained  Isolation Precautions: droplet precautions maintained

## 2020-02-19 NOTE — Unmapped (Signed)
Patient vomitted once after being transferred to our unit. IV zofran ordered and given. Temp is 102.7, tylenol suppository ordered. Given. Pt is now resting. Covid positive. Bed is low and locked. Will continue to monitor.       Problem: Adult Inpatient Plan of Care  Goal: Absence of Hospital-Acquired Illness or Injury  Intervention: Identify and Manage Fall Risk  Recent Flowsheet Documentation  Taken 02/18/2020 1600 by Judeth Porch, RN  Safety Interventions: low bed  Intervention: Prevent and Manage VTE (Venous Thromboembolism) Risk  Recent Flowsheet Documentation  Taken 02/18/2020 1600 by Judeth Porch, RN  Activity Management: activity adjusted per tolerance

## 2020-02-20 LAB — COMPREHENSIVE METABOLIC PANEL
ALBUMIN: 2.9 g/dL — ABNORMAL LOW (ref 3.4–5.0)
ALKALINE PHOSPHATASE: 48 U/L (ref 46–116)
ALT (SGPT): 18 U/L (ref 10–49)
ANION GAP: 7 mmol/L (ref 5–14)
AST (SGOT): 14 U/L (ref <=34)
BILIRUBIN TOTAL: 0.6 mg/dL (ref 0.3–1.2)
BLOOD UREA NITROGEN: 19 mg/dL (ref 9–23)
BUN / CREAT RATIO: 12
CALCIUM: 9.3 mg/dL (ref 8.7–10.4)
CHLORIDE: 103 mmol/L (ref 98–107)
CO2: 26 mmol/L (ref 20.0–31.0)
CREATININE: 1.62 mg/dL — ABNORMAL HIGH
EGFR CKD-EPI AA FEMALE: 48 mL/min/{1.73_m2} — ABNORMAL LOW (ref >=60)
EGFR CKD-EPI NON-AA FEMALE: 42 mL/min/{1.73_m2} — ABNORMAL LOW (ref >=60)
GLUCOSE RANDOM: 96 mg/dL (ref 70–179)
POTASSIUM: 4.3 mmol/L (ref 3.4–4.5)
PROTEIN TOTAL: 6.7 g/dL (ref 5.7–8.2)
SODIUM: 136 mmol/L (ref 135–145)

## 2020-02-20 LAB — CBC W/ AUTO DIFF
BASOPHILS ABSOLUTE COUNT: 0 10*9/L (ref 0.0–0.1)
BASOPHILS RELATIVE PERCENT: 0.6 %
EOSINOPHILS ABSOLUTE COUNT: 0.2 10*9/L (ref 0.0–0.4)
EOSINOPHILS RELATIVE PERCENT: 4.6 %
HEMATOCRIT: 35.6 % — ABNORMAL LOW (ref 36.0–46.0)
HEMOGLOBIN: 11.4 g/dL — ABNORMAL LOW (ref 12.0–16.0)
LARGE UNSTAINED CELLS: 2 % (ref 0–4)
LYMPHOCYTES ABSOLUTE COUNT: 0.3 10*9/L — ABNORMAL LOW (ref 1.5–5.0)
LYMPHOCYTES RELATIVE PERCENT: 9.5 %
MEAN CORPUSCULAR HEMOGLOBIN CONC: 32.1 g/dL (ref 31.0–37.0)
MEAN CORPUSCULAR HEMOGLOBIN: 31.4 pg (ref 26.0–34.0)
MEAN CORPUSCULAR VOLUME: 97.9 fL (ref 80.0–100.0)
MEAN PLATELET VOLUME: 9.5 fL (ref 7.0–10.0)
MONOCYTES ABSOLUTE COUNT: 0.1 10*9/L — ABNORMAL LOW (ref 0.2–0.8)
MONOCYTES RELATIVE PERCENT: 4 %
NEUTROPHILS ABSOLUTE COUNT: 2.8 10*9/L (ref 2.0–7.5)
NEUTROPHILS RELATIVE PERCENT: 79.5 %
PLATELET COUNT: 217 10*9/L (ref 150–440)
RED BLOOD CELL COUNT: 3.64 10*12/L — ABNORMAL LOW (ref 4.00–5.20)
RED CELL DISTRIBUTION WIDTH: 15.4 % — ABNORMAL HIGH (ref 12.0–15.0)
WBC ADJUSTED: 3.5 10*9/L — ABNORMAL LOW (ref 4.5–11.0)

## 2020-02-20 LAB — TACROLIMUS LEVEL, TROUGH: TACROLIMUS, TROUGH: 11.6 ng/mL (ref 5.0–15.0)

## 2020-02-20 LAB — MAGNESIUM: MAGNESIUM: 1.6 mg/dL (ref 1.6–2.6)

## 2020-02-20 LAB — BILIRUBIN, DIRECT: BILIRUBIN DIRECT: 0.2 mg/dL (ref 0.00–0.30)

## 2020-02-20 MED ADMIN — fidaxomicin (DIFICID) tablet 200 mg: 200 mg | ORAL | @ 03:00:00 | Stop: 2020-02-21

## 2020-02-20 MED ADMIN — sodium bicarbonate tablet 1,300 mg: 1300 mg | ORAL | @ 19:00:00 | Stop: 2020-02-22

## 2020-02-20 MED ADMIN — fidaxomicin (DIFICID) tablet 200 mg: 200 mg | ORAL | @ 15:00:00 | Stop: 2020-02-21

## 2020-02-20 MED ADMIN — sodium bicarbonate tablet 1,300 mg: 1300 mg | ORAL | @ 03:00:00 | Stop: 2020-02-22

## 2020-02-20 MED ADMIN — valGANciclovir (VALCYTE) tablet 450 mg: 450 mg | ORAL | @ 15:00:00 | Stop: 2020-02-22

## 2020-02-20 MED ADMIN — heparin (porcine) 5,000 unit/mL injection 5,000 Units: 5000 [IU] | SUBCUTANEOUS | @ 19:00:00 | Stop: 2020-02-22

## 2020-02-20 MED ADMIN — cefTRIAXone (ROCEPHIN) 1 g in sodium chloride 0.9 % (NS) 100 mL IVPB-connector bag: 1 g | INTRAVENOUS | @ 03:00:00 | Stop: 2020-02-20

## 2020-02-20 MED ADMIN — tacrolimus (ENVARSUS XR) extended release tablet 21 mg: 21 mg | ORAL | @ 15:00:00 | Stop: 2020-02-20

## 2020-02-20 MED ADMIN — heparin (porcine) 5,000 unit/mL injection 5,000 Units: 5000 [IU] | SUBCUTANEOUS | @ 11:00:00 | Stop: 2020-02-22

## 2020-02-20 MED ADMIN — predniSONE (DELTASONE) tablet 5 mg: 5 mg | ORAL | @ 15:00:00 | Stop: 2020-02-22

## 2020-02-20 MED ADMIN — aspirin chewable tablet 81 mg: 81 mg | ORAL | @ 15:00:00 | Stop: 2020-02-22

## 2020-02-20 MED ADMIN — heparin (porcine) 5,000 unit/mL injection 5,000 Units: 5000 [IU] | SUBCUTANEOUS | @ 03:00:00 | Stop: 2020-02-22

## 2020-02-20 MED ADMIN — sodium bicarbonate tablet 1,300 mg: 1300 mg | ORAL | @ 15:00:00 | Stop: 2020-02-22

## 2020-02-20 MED ADMIN — valGANciclovir (VALCYTE) tablet 450 mg: 450 mg | ORAL | @ 03:00:00 | Stop: 2020-02-20

## 2020-02-20 MED ADMIN — remdesivir (VEKLURY) 100 mg in sodium chloride (NS) 0.9 % 295 mL IVPB: 100 mg | INTRAVENOUS | @ 15:00:00 | Stop: 2020-02-21

## 2020-02-20 NOTE — Unmapped (Signed)
Tacrolimus Therapeutic Monitoring Pharmacy Note    Elaine Koch is a 35 y.o. female continuing tacrolimus.     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 Monitor renal function (SCr and urine output) and liver function (LFTs)  ?? Monitor for signs/symptoms of adverse events (e.g., hyperglycemia, hyperkalemia, hypomagnesemia, hypertension, headache, tremor)    Results:   Tacrolimus level: 11.6 ng/mL, drawn appropriately    Pharmacokinetic Considerations and Significant Drug Interactions:  ??? Concurrent hepatotoxic medications: None identified  ??? Concurrent CYP3A4 substrates/inhibitors: None identified  ??? Concurrent nephrotoxic medications: None identified    Assessment/Plan:  Recommendedation(s)  ??? Level is double of upper end of goal  ??? Hold current regimen of Envarsus 21 mg PO daily until repeat level on 1/10  o Ok to give dose on 1/10 in early afternoon if level drops quickly    Follow-up  ??? Next level has been ordered on 02/21/20 at 0600.   ??? A pharmacist will continue to monitor and recommend levels as appropriate    Please page service pharmacist with questions/clarifications.    Vladimir Faster, PharmD

## 2020-02-20 NOTE — Unmapped (Signed)
Nephrology ([facility]) Progress Note    Assessment & Plan:   Elaine Koch is a 35 y.o. 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 with increasing back and abdominal pain, nausea, vomiting, and an AKI. Febrile on arrival and found to be COVID positive with no respiratory symptoms.    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 *      AKI on renal transplant (04/2019): Creatinine in the ED 2.41 compared to her baseline levels in the 1.4 range. AKI likely secondary to dehydration from vomiting and diarrhea and poor PO intake given lack of any new nephrotoxic meds. Low suspicion for transplant rejection and no abnormalities on renal transplant ultrasound. Received some fluids on admission with mild improvement in Cr (2.34). AKI may be related to ongoing infections (COVID, bacteremia, UTI). Pre-renal etiology further supported by improvement s/p hydration.     - Continuous IVF ordered ( LR @ )  - Continue to trend creatinine  - Treatment for infections as below  - Avoid nephrotoxic medications  - Immunosuppression:     - Tacrolimus per pharmacy, continue 21mg  daily. Transition to sublingual 1/9 if cannot tolerate PO (Trough goal 5-6)    - Continue Prednisone 5mg  daily, hold Myfortic  - Valganciclovir ppx continued    GNR Bacteremia ( Proteus Mirabilis)   Blood cultures from admission 2/2+ for gram negative rods now proteus. Potential sources include UTI although she generally has grown Enterococcus in urine. ICID also raised concern that source may be from recent fecal transplant although unclear at this time. Pt remains hemodynamically stable.  - ICID following    - on Ceftrixaone 2g Q 24 hour --- > will consider de-escalating to oral per ID plan will need tx duration for pyleo    UTI/Pyelonephritis  Dysuria and UA with large leuk esterase, nitrites, and bacteria likely represents an active UTI and likely pyelonephritis given back and abdominal pain and fever and tachycardia. Not septic at this time given normal blood pressure and lactate, however she is tachycardic to the low 100s  Prior urine cultures have grown Enterococcus faecalis. Received dose of Vancomycin and Cefepime in ED as well as ampicillin. Urine culture w/ >100k Proteus    - F/u UCx sensitivities  - Continue Cefepime as above --- > Ceftriaxone 2g Q 24 hour     COVID in immunosuppressed: Found to be COVID+ in the ED. Vaccinated x3, most recent in 10/2019. Febrile to 39C on admission and tachycardic but stable from a respiratory standpoint with good O2 sats. No respiratory symptoms at present. CXR read with LLL opacity but not impressive. 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 ICID consulted who provided recommendations for COVID therapies  - Remdesivir 200mg  x1 followed by 100mg  daily, will f/u on final course    - Daily monitoring of LFTs and Cr during therapy    - Baseline CRP ordered  - Monoclonal antibody ordered and received   - F/u Ddimer for anticoagulation plan        Abdominal pain, nausea/vomiting(improving): GI sxs for last 2  days. Not able to keep pills down in ED. Likely related to COVID infection.  -- IVF as above  -- antiemetics PRN  ??  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 No diarrhea at present.  - PO Fidaxomicin per ICID  ??  Hx T1DM: Not currently using insulin or any DM meds. Glucoses have been WNL since pancreatic transplant.      Daily Checklist:  Diet: Regular Diet  DVT PPx: Heparin 5000units q8h  Electrolytes: Replete Potassium to >/=4 and Magnesium to >/=2  Code Status: Full Code  Dispo: Home    Team Contact Information:   Primary Team: Nephrology ([facility])  Primary Resident: Louie Bun, MD  Resident's Pager: [phone] (Nephrology Intern - Blue)    Interval History:   No acute events overnight.   All other systems were reviewed and are negative except as noted in the HPI    Objective:   Temp:  [36.5 ??C-36.8 ??C] 36.8 ??C  Heart Rate:  [81-102] 81  Resp:  [18-20] 18  BP: (120-129)/(72-81) 125/81  SpO2:  [95 %-97 %] 97 %    Gen: WDWN female in NAD, answers questions appropriately  Heart: regular rate with regular rhythm, S1, S2, no M/R/G, no chest wall tenderness  Lungs: CTAB anteriorly, no crackles or wheezes, no use of accessory muscles  Abdomen: Normoactive bowel sounds, soft, mildly tender in bilateral lower quadrants including over transplant kidney, no rebound/guarding  Extremities: no clubbing, cyanosis, or edema: pulses are +2 in bilateral upper and lower extremities  Neuro: CN II-XI grossly intact. No focal deficits.  Skin:  No rashes, lesions on clothed exam  Psych: Alert, oriented, normal mood and affect.   ??    Labs/Studies: Labs and Studies from the last 24hrs per EMR and Reviewed

## 2020-02-20 NOTE — Unmapped (Signed)
Pt alert and oriented x4. Denies N/V and pain this shift. Received IV ABX as scheduled. LR infusing @100ml /hr. Pt afebrile, VSS with sat of 95% on RA. Special airborne precaution maintained. Pt ambulates independently. No falls/injuries. Call light within reach. Will continue to monitor.    Problem: Adult Inpatient Plan of Care  Goal: Plan of Care Review  Outcome: Progressing  Goal: Patient-Specific Goal (Individualized)  Outcome: Progressing  Goal: Absence of Hospital-Acquired Illness or Injury  Outcome: Progressing  Intervention: Identify and Manage Fall Risk  Recent Flowsheet Documentation  Taken 02/19/2020 2200 by Willa Frater, RN  Safety Interventions:  ??? fall reduction program maintained  ??? low bed  Intervention: Prevent and Manage VTE (Venous Thromboembolism) Risk  Recent Flowsheet Documentation  Taken 02/19/2020 2200 by Willa Frater, RN  VTE Prevention/Management: anticoagulant therapy  Intervention: Prevent Infection  Recent Flowsheet Documentation  Taken 02/19/2020 2200 by Willa Frater, RN  Infection Prevention:  ??? equipment surfaces disinfected  ??? hand hygiene promoted  Goal: Optimal Comfort and Wellbeing  Outcome: Progressing  Goal: Readiness for Transition of Care  Outcome: Progressing  Goal: Rounds/Family Conference  Outcome: Progressing     Problem: Infection  Goal: Absence of Infection Signs and Symptoms  Outcome: Progressing  Intervention: Prevent or Manage Infection  Recent Flowsheet Documentation  Taken 02/19/2020 2200 by Willa Frater, RN  Infection Management: aseptic technique maintained  Isolation Precautions: airborne precautions maintained

## 2020-02-21 DIAGNOSIS — D849 Immunodeficiency, unspecified: Principal | ICD-10-CM

## 2020-02-21 DIAGNOSIS — Z940 Kidney transplant status: Principal | ICD-10-CM

## 2020-02-21 LAB — CBC W/ AUTO DIFF
BASOPHILS ABSOLUTE COUNT: 0 10*9/L (ref 0.0–0.1)
BASOPHILS RELATIVE PERCENT: 0.4 %
EOSINOPHILS ABSOLUTE COUNT: 0.1 10*9/L (ref 0.0–0.4)
EOSINOPHILS RELATIVE PERCENT: 5.1 %
HEMATOCRIT: 35.6 % — ABNORMAL LOW (ref 36.0–46.0)
HEMOGLOBIN: 11.5 g/dL — ABNORMAL LOW (ref 12.0–16.0)
LARGE UNSTAINED CELLS: 2 % (ref 0–4)
LYMPHOCYTES ABSOLUTE COUNT: 0.4 10*9/L — ABNORMAL LOW (ref 1.5–5.0)
LYMPHOCYTES RELATIVE PERCENT: 19.2 %
MEAN CORPUSCULAR HEMOGLOBIN CONC: 32.4 g/dL (ref 31.0–37.0)
MEAN CORPUSCULAR HEMOGLOBIN: 31.7 pg (ref 26.0–34.0)
MEAN CORPUSCULAR VOLUME: 97.7 fL (ref 80.0–100.0)
MEAN PLATELET VOLUME: 9.4 fL (ref 7.0–10.0)
MONOCYTES ABSOLUTE COUNT: 0.1 10*9/L — ABNORMAL LOW (ref 0.2–0.8)
MONOCYTES RELATIVE PERCENT: 2.5 %
NEUTROPHILS ABSOLUTE COUNT: 1.6 10*9/L — ABNORMAL LOW (ref 2.0–7.5)
NEUTROPHILS RELATIVE PERCENT: 70.9 %
PLATELET COUNT: 239 10*9/L (ref 150–440)
RED BLOOD CELL COUNT: 3.64 10*12/L — ABNORMAL LOW (ref 4.00–5.20)
RED CELL DISTRIBUTION WIDTH: 15.4 % — ABNORMAL HIGH (ref 12.0–15.0)
WBC ADJUSTED: 2.3 10*9/L — ABNORMAL LOW (ref 4.5–11.0)

## 2020-02-21 LAB — COMPREHENSIVE METABOLIC PANEL
ALBUMIN: 2.9 g/dL — ABNORMAL LOW (ref 3.4–5.0)
ALKALINE PHOSPHATASE: 48 U/L (ref 46–116)
ALT (SGPT): 23 U/L (ref 10–49)
ANION GAP: 6 mmol/L (ref 5–14)
AST (SGOT): 20 U/L (ref <=34)
BILIRUBIN TOTAL: 0.6 mg/dL (ref 0.3–1.2)
BLOOD UREA NITROGEN: 18 mg/dL (ref 9–23)
BUN / CREAT RATIO: 12
CALCIUM: 9.3 mg/dL (ref 8.7–10.4)
CHLORIDE: 105 mmol/L (ref 98–107)
CO2: 27 mmol/L (ref 20.0–31.0)
CREATININE: 1.54 mg/dL — ABNORMAL HIGH
EGFR CKD-EPI AA FEMALE: 51 mL/min/{1.73_m2} — ABNORMAL LOW (ref >=60)
EGFR CKD-EPI NON-AA FEMALE: 44 mL/min/{1.73_m2} — ABNORMAL LOW (ref >=60)
GLUCOSE RANDOM: 94 mg/dL (ref 70–179)
POTASSIUM: 3.7 mmol/L (ref 3.4–4.5)
PROTEIN TOTAL: 6.7 g/dL (ref 5.7–8.2)
SODIUM: 138 mmol/L (ref 135–145)

## 2020-02-21 LAB — MAGNESIUM: MAGNESIUM: 1.5 mg/dL — ABNORMAL LOW (ref 1.6–2.6)

## 2020-02-21 LAB — TACROLIMUS LEVEL, TROUGH: TACROLIMUS, TROUGH: 13.5 ng/mL (ref 5.0–15.0)

## 2020-02-21 MED ADMIN — heparin (porcine) 5,000 unit/mL injection 5,000 Units: 5000 [IU] | SUBCUTANEOUS | @ 13:00:00 | Stop: 2020-02-22

## 2020-02-21 MED ADMIN — fidaxomicin (DIFICID) tablet 200 mg: 200 mg | ORAL | @ 02:00:00 | Stop: 2020-02-21

## 2020-02-21 MED ADMIN — sodium bicarbonate tablet 1,300 mg: 1300 mg | ORAL | @ 14:00:00 | Stop: 2020-02-22

## 2020-02-21 MED ADMIN — sodium bicarbonate tablet 1,300 mg: 1300 mg | ORAL | @ 19:00:00 | Stop: 2020-02-22

## 2020-02-21 MED ADMIN — remdesivir (VEKLURY) 100 mg in sodium chloride (NS) 0.9 % 295 mL IVPB: 100 mg | INTRAVENOUS | @ 15:00:00 | Stop: 2020-02-21

## 2020-02-21 MED ADMIN — heparin (porcine) 5,000 unit/mL injection 5,000 Units: 5000 [IU] | SUBCUTANEOUS | @ 18:00:00 | Stop: 2020-02-22

## 2020-02-21 MED ADMIN — cefTRIAXone (ROCEPHIN) 2 g in sodium chloride 0.9 % (NS) 100 mL IVPB-connector bag: 2 g | INTRAVENOUS | @ 02:00:00 | Stop: 2020-02-21

## 2020-02-21 MED ADMIN — aspirin chewable tablet 81 mg: 81 mg | ORAL | @ 14:00:00 | Stop: 2020-02-22

## 2020-02-21 MED ADMIN — fidaxomicin (DIFICID) tablet 200 mg: 200 mg | ORAL | @ 14:00:00 | Stop: 2020-02-21

## 2020-02-21 MED ADMIN — sodium bicarbonate tablet 1,300 mg: 1300 mg | ORAL | @ 02:00:00 | Stop: 2020-02-22

## 2020-02-21 MED ADMIN — valGANciclovir (VALCYTE) tablet 450 mg: 450 mg | ORAL | @ 14:00:00 | Stop: 2020-02-22

## 2020-02-21 MED ADMIN — predniSONE (DELTASONE) tablet 5 mg: 5 mg | ORAL | @ 14:00:00 | Stop: 2020-02-22

## 2020-02-21 MED ADMIN — heparin (porcine) 5,000 unit/mL injection 5,000 Units: 5000 [IU] | SUBCUTANEOUS | @ 03:00:00 | Stop: 2020-02-22

## 2020-02-21 MED ADMIN — levoFLOXacin (LEVAQUIN) tablet 500 mg: 500 mg | ORAL | @ 15:00:00 | Stop: 2020-02-22

## 2020-02-21 NOTE — Unmapped (Signed)
IMMUNOCOMPROMISED HOST INFECTIOUS DISEASE CONSULT NOTE      Elaine Koch is being seen in consultation at the request of Walker Kehr, MD for evaluation of GNR bacteremia and COVID-19.    Assessment/Recommendations:    Elaine Koch is a 35 y.o. female     Unclear etiology of worsening leukopenia/lymphopenia. Could be from valgan, CTX, COVID infection? Check CBC with diff tomorrow before discharge. If worsening, may need to stop valgan and do pre-emptive monitoring.     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  Pertinent Co-morbidities  #T1DM  - diagnosed at age 18, c/b nephropathy and retinopathy  -?? 10/13/2019??A1C 4.3??  ??  #Sickle cell trait  ??  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    ??#mild COVID infection  - 02/17/20 COVID PCR positive with Ct 18.3    #Proteus mirabilis bacteremia, likely urinary source 02/17/20  - 1/6 presented febrile, back pain. 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 Fara Boros on 02/14/20  ??  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  # E. faecalis??UTI vs menstrual cramping (w/o bleeding)??10/01/19  - 8/20 fever T 38.3??but no dysuria, no suprapubic pain. Tender on LLQ over??renal??allograft  - 8/21 UA wbc 3;??8/21 UCx 50k-100k E. Faecalis??(R-doxy, S-amp, nitrofurantoin)  - 8/21 unremarkable renal US??(CT c/a/p, transvaginal US)  - s/p??linezolid 600mg  BID PO x7days 8/22-8/28       RECOMMENDATIONS FOR 02/21/2020    Diagnostic  ??? Fu repeat blood cx from 1/7 for clearance  ??? Check CMV PCR from blood  ??? Obtain CBC with diff tomorrow      Treatment    For Proteus bacteremia and Proteus UTI  ?? CONTINUE ceftriaxone 2g q24h. At discharge, can START po ciprofloxacin renally dosed equivalent of 500 mg BID. Would continue for 10 days from 1/7 (1/7-1/17/22)    For history of recurrent C. Diff, now s/p recent FMT   ?? Can STOP po fidaxomicin 200 mg BID for C. Diff ppx. Discussed with Dr. Fara Boros, who did her fecal transplant.   ??   For ppx:  ??? Continue valganciclovir renally dosed equivalent of 900 mg daily (ppx dosing).  Please discuss dose with pharmacy given improvement in renal function. Additionally, would check CBC with diff tmrw for discharge given worsening leukopenia. We will follow up on CBC w diff tomorrow   ??? Estimated Creatinine Clearance: 55 mL/min (A) (based on SCr of 1.54 mg/dL (H)).  ???  For mild COVID-19 infection:  ?? S/p cas/imde on 1/7  ?? Can STOP remdesivir (D1 1/7)                The [facility] ID service will sign off but will fu CBC with diff tomorrow re valgancyclovir. I have requested ID fu in ~2 weeks with Dr. Reynold Bowen  Please page the ID Transplant/Liquid Oncology Fellow consult at [phone] with questions.  Patient discussed with Dr. Cardell Peach.      Interval events:  Afebrile. NAEO.     Medications:   Antimicrobials:  Anti-infectives (From admission, onward)    Start     Dose/Rate Route Frequency Ordered Stop 02/20/20 2000  cefTRIAXone (ROCEPHIN) 2 g in sodium chloride 0.9 % (NS) 100 mL IVPB-connector bag         2 g  200 mL/hr over 30 Minutes Intravenous Every 24 hours 02/20/20 0739 02/26/20 1959    02/20/20 1000  valGANciclovir (VALCYTE) tablet 450 mg         450 mg Oral Daily (standard) 02/20/20 0927      02/19/20 0900  remdesivir (VEKLURY) 100 mg in sodium chloride (NS) 0.9 % 295 mL IVPB        Question Answer Comment   Has the patient/caregiver been given copy of EUA fact sheet? Not applicable - patient is >/=33 years of age AND >/= 40kg    Has the patient/caregiver been counseled on alternative therapies? Not applicable - patient is >/= 33 years of age AND >/= 40kg    Has the patient/caregiver been counseled that this medication is an unapproved drug? Not applicable - patient is >/= 33 years of age AND >/= 40kg    Lab confirmed SARS-CoV-2 infection? Yes    Does patient have symptoms consistent with SARS-CoV-2 infection? Yes    Hospitalization less than or equal to 10 days for COVID-19? Yes       Followed by Linked Group Details    100 mg  590 mL/hr over 30 Minutes Intravenous Daily (standard) 02/18/20 1544 02/27/20 0859    02/18/20 2100  fidaxomicin (DIFICID) tablet 200 mg         200 mg Oral Every 12 hours scheduled 02/18/20 1537 02/25/20 2059          Current/Prior immunomodulators:  Tac  pred (held)  MMF (held)    Other medications reviewed.        Vital Signs last 24 hours:  Temp:  [36 ??C (96.8 ??F)-36.6 ??C (97.9 ??F)] 36.6 ??C (97.9 ??F)  Heart Rate:  [77-95] 77  Resp:  [17-20] 17  BP: (106-136)/(69-82) 135/78  MAP (mmHg):  [81-100] 97  SpO2:  [96 %-98 %] 98 %    Physical Exam:  Patient Lines/Drains/Airways Status     Active Active Lines, Drains, & Airways     Name Placement date Placement time Site Days    Peripheral IV 02/17/20 Left Antecubital 02/17/20  1556  Antecubital  3    Arteriovenous Fistula - Vein Graft  Access Arteriovenous fistula Right;Upper Arm ???  ???  Arm                GEN:  looks well, no apparent distress  EYES: PERRL, EOMI  AV:WUJWJX reate  PULM:normal work of breathing at rest  BJ:YNWG, no TTP, no flank pain  NF:AOZHYQMV  RECTAL:deferred  SKIN:no petechiae, ecchymoses or obvious rashes on clothed exam  NEURO:no tremor noted, facial expression symmetric and moves extremities equally  PSYCH:attentive, appropriate affect, good eye contact,  fluent speech      Data for Medical Decision Making     Recent Labs   Lab Units 02/21/20  0428 02/20/20  0756 02/18/20  0554 02/17/20  2314 02/17/20  1741 02/17/20  1555   WBC 10*9/L 2.3* 3.5* 8.2  --   --  5.0   HEMOGLOBIN g/dL 16.1* 09.6* 04.5*  --   --  13.1   PLATELET COUNT (1) 10*9/L 239 217 207  --   --  251   NEUTRO ABS 10*9/L 1.6* 2.8 7.6*  --   --  4.6   LYMPHO ABS 10*9/L 0.4* 0.3* 0.4*  --   --  0.3*   EOSINO ABS 10*9/L 0.1 0.2 0.0  --   --  0.0   BUN mg/dL 18 19 21   --   --  19   CREATININE mg/dL 4.09* 8.11* 9.14*  --   --  2.41*   AST U/L 20 14  --   --  22  --    ALT U/L 23 18  --   --   --  29   BILIRUBIN TOTAL mg/dL 0.6 0.6  --   --   --  1.4*   ALK PHOS U/L 48 48  --   --   --  53   POTASSIUM WHOLE BLOOD mmol/L  --   --   --   --   --  6.9*   POTASSIUM mmol/L 3.7 4.3 4.6* 4.4 4.5  --    MAGNESIUM mg/dL 1.5* 1.6 1.3*  --   --  1.5*   CALCIUM mg/dL 9.3 9.3 9.0  --   --  9.7       New Culture Data   Microbiology Results (last day)     Procedure Component Value Date/Time Date/Time    Urine Culture [[id number]]  (Abnormal)  (Susceptibility) Collected: 02/17/20 1749    Lab Status: Final result Specimen: Urine from Clean Catch Updated: 02/19/20 1435     Urine Culture, Comprehensive >100,000 CFU/mL Proteus mirabilis    Narrative:      Specimen Source: Clean Catch    Susceptibility     Proteus mirabilis (1)     Antibiotic Interpretation Microscan Method Status    Ampicillin Susceptible  MIC SUSCEPTIBILITY RESULT Final    Cefazolin Resistant  MIC SUSCEPTIBILITY RESULT Final    Cephalexin Susceptible  MIC SUSCEPTIBILITY RESULT Final     For uncomplicated UTI's only.  Also predicts response for cefdinir, cefpodoxime and cefuroxime.        Ceftazidime Susceptible  MIC SUSCEPTIBILITY RESULT Final    Ceftriaxone Susceptible  MIC SUSCEPTIBILITY RESULT Final    Ciprofloxacin Susceptible  MIC SUSCEPTIBILITY RESULT Final    Gentamicin Susceptible  MIC SUSCEPTIBILITY RESULT Final    Levofloxacin Susceptible  MIC SUSCEPTIBILITY RESULT Final    Nitrofurantoin Resistant  MIC SUSCEPTIBILITY RESULT Final    Piperacillin + Tazobactam Susceptible  MIC SUSCEPTIBILITY RESULT Final    Tetracycline Resistant  MIC SUSCEPTIBILITY RESULT Final     Organisms that test susceptible to tetracycline are considered susceptible to doxycycline.However, some organisms that test intermediate or resistant to tetracycline may be susceptible to doxycycline.       Tobramycin Susceptible  MIC SUSCEPTIBILITY RESULT Final    Trimethoprim + Sulfamethoxazole Susceptible  MIC SUSCEPTIBILITY RESULT Final                   Blood Culture #1 [[id number]]  (Abnormal) Collected: 02/17/20 2002    Lab Status:  Preliminary result Specimen: Blood from 1 Peripheral Draw Updated: 02/19/20 1055     Blood Culture, Routine Proteus mirabilis     Gram Stain Result Gram negative rods (bacilli)    Blood Culture #2 [[id number]]  (Abnormal) Collected: 02/17/20 1950    Lab Status: Preliminary result Specimen: Blood from 1 Peripheral Draw Updated: 02/19/20 1054     Blood Culture, Routine Proteus mirabilis     Comment: Susceptibility Results to follow        Gram Stain Result Gram negative rods (bacilli)    Blood Culture #2 [[id number]] Collected: 02/18/20 1842    Lab Status: In process Specimen: Blood from 1 Peripheral Draw Updated: 02/18/20 1846    Blood Culture #1 [[id number]] Collected: 02/18/20 1842    Lab Status: In process Specimen: Blood from 1 Peripheral Draw Updated: 02/18/20 1846              Recent Studies   No results found.      Serologies:  Lab Results   Component Value Date    CMV IGG Positive (A) 05/02/2019    EBV VCA IgG Antibody Positive (A) 05/02/2019    Hep A IgG Nonreactive 03/26/2018    Hep B Surface Ag Nonreactive 05/02/2019    Hep B S Ab Reactive (A) 05/02/2019    Hep B Surf Ab Quant 209.45 (H) 05/02/2019    Hepatitis C Ab Nonreactive 05/02/2019    RPR Nonreactive 05/02/2019    HSV 1 IgG Positive (A) 05/02/2019    HSV 2 IgG Positive (A) 05/02/2019    Varicella IgG Positive 05/02/2019    Rubella IgG Scr Positive 03/26/2018    Toxoplasma Gondii IgG Negative 03/26/2018    Quantiferon TB Gold Plus Interpretation Negative 03/26/2018    Quantiferon Mitogen Minus Nil >10.00 03/26/2018    Quantiferon Antigen 1 minus Nil 0.00 03/26/2018       Immunizations:  Immunization History   Administered Date(s) Administered   ??? COVID-19 VACC,MRNA,(PFIZER)(PF)(IM) 04/29/2019, 08/06/2019, 10/13/2019   ??? HPV Quadrivalent (Gardasil) 05/28/2012   ??? Hepatitis B, Adult 12/10/2017   ??? Influenza Vaccine Quad (IIV4 PF) 55mo+ injectable 10/17/2015, 01/29/2019, 11/26/2019   ??? Influenza Virus Vaccine, unspecified formulation 11/12/2016, 01/31/2019   ??? PNEUMOCOCCAL POLYSACCHARIDE 23 12/20/2016   ??? TdaP 10/17/2015

## 2020-02-21 NOTE — Unmapped (Signed)
Social Work  Psychosocial Assessment    Patient Name: Elaine Koch   Medical Record Number: [id number]   Date of Birth: [birth year]  Sex: Female     Referral  Referred by: Care Manager  Reason for Referral: Complex Discharge Planning  No Psychosocial Interventions Necessary: No Psychosocial Interventions Necessary    Extended Emergency Contact Information  Primary Emergency Contact: Hicks,Dorothy   [place]  Home Phone: [phone]  Relation: Mother  Preferred language: ENGLISH  Interpreter needed? No    Legal Next of Kin / Guardian / POA / Advance Directives    HCDM (patient stated preference): Hicks,Dorothy - Mother - [phone]    Advance Directive (Medical Treatment)  Does patient have an advance directive covering medical treatment?: Patient does not have advance directive covering medical treatment.  Reason patient does not have an advance directive covering medical treatment:: Patient does not wish to complete one at this time.    Health Care Decision Maker [HCDM] (Medical & Mental Health Treatment)  Healthcare Decision Maker: Patient does not wish to appoint a Health Care Decision Maker at this time  Information offered on HCDM, Medical & Mental Health advance directives:: Patient declined information.    Advance Directive (Mental Health Treatment)  Does patient have an advance directive covering mental health treatment?: Patient does not have advance directive covering mental health treatment.  Reason patient does not have an advance directive covering mental health treatment:: Patient does not wish to complete one at this time.    Discharge Planning  Discharge Planning Information:   Type of Residence   Mailing Address:  [address]  [place] [address]    Medical Information   Past Medical History:   Diagnosis Date   ??? Chronic hypertension during pregnancy, antepartum 07/22/2015    Overview:  Methyldopa recommended per nephrologist if needed   ??? Diabetes mellitus type 1 (CMS-HCC)    ??? ESRD (end stage renal disease) (CMS-HCC)    ??? History of pre-eclampsia 10/24/2016   ??? 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 AV FISTULA PLACEMENT  2018   ??? CESAREAN SECTION     ??? PR COLONOSCOPY FLX DX W/COLLJ SPEC WHEN PFRMD N/A 02/14/2020    Procedure: COLONOSCOPY, FLEXIBLE, PROXIMAL TO SPLENIC FLEXURE; DIAGNOSTIC, W/WO COLLECTION SPECIMEN BY BRUSH OR WASH;  Surgeon: Carmon Ginsberg, MD;  [facility];  Service: Gastroenterology   ??? PR FECAL MICROBIOTA PREP INSTIL N/A 02/14/2020    Procedure: PREP W INSTILLATION FECAL MICROBIOTA, ANY METHOD;  Surgeon: Carmon Ginsberg, MD;  Location: [facility];  Service: Gastroenterology   ??? PR TRANSPLANT ALLOGRAFT PANCREAS N/A 05/03/2019    Procedure: TRANSPLANTATION OF PANCREATIC ALLOGRAFT;  Surgeon: Leona Carry, MD;  Location: [facility]IN OR [facility];  Service: Transplant   ??? PR TRANSPLANT,PREP CADAVER RENAL GRAFT N/A 05/03/2019    Procedure: [facility] STD PREP CAD DONR RENAL ALLOGFT PRIOR TO TRNSPLNT, INCL DISSEC/REM PERINEPH FAT, DIAPH/RTPER ATTAC;  Surgeon: Leona Carry, MD;  Location: MAIN OR [facility];  Service: Transplant   ??? PR TRANSPLANT,PREP DONOR PANCREAS N/A 05/03/2019    Procedure: [facility] STANDARD PREPARATION OF CADAVER DONOR PANCREAS ALLOGRAFT PRIOR TO TRANSPLANTATION;  Surgeon: Leona Carry, MD;  Location: MAIN OR [facility];  Service: Transplant   ??? PR TRANSPLANTATION OF KIDNEY N/A 05/03/2019    Procedure: RENAL ALLOTRANSPLANTATION, IMPLANTATION OF GRAFT; WITHOUT  RECIPIENT NEPHRECTOMY;  Surgeon: Leona Carry, MD;  Location: MAIN OR [facility];  Service: Transplant   ??? TONSILLECTOMY         Family History   Problem Relation Age of Onset   ??? Diabetes Mother    ??? Diabetes Father    ??? Cancer Maternal Grandmother    ??? Diabetes Maternal Grandfather    ??? Diabetes Paternal Location manager Insurance: Payor: MEDICARE / Plan: MEDICARE PART A AND PART B / Product Type: *No Product type* /    Secondary Insurance: Secondary Insurance  MEDICAID [place]   Prescription Coverage: Medicare D     Preferred Pharmacy: [facility] [phone] - [place], [place] - [address] AT [facility] OF [address]    Barriers to taking medication: No    Transition Home   Transportation at time of discharge: Family/Friend's Private Vehicle    Anticipated changes related to Illness: TBD   Services in place prior to admission: N/A   Services anticipated for DC: N/A   Hemodialysis Prior to Admission: No    Readmission  Risk of Unplanned Readmission Score: UNPLANNED READMISSION SCORE: 19%  Readmitted Within the Last 30 Days?   Readmission Factors include: other: w/in 1 year of K/P txp    Social Determinants of Health  Social Determinants of Health were addressed in provider documentation.  Please refer to patient history.    Social History  Support Systems/Concerns: Armed forces logistics/support/administrative officer Service: No Field seismologist Affecting Healthcare: none    Medical and Psychiatric History  Psychosocial Stressors: Denies      Psychological Issues/Information: No issues              Chemical Dependency: None              Outpatient Providers: Specialist   Name / Contact #: Conservator, museum[facility]  Legal: No legal issues      Ability to [employer]: No issues accessing community services      **  Pt readmitted w/ COVID and UTI.  Brief discussion w/ team during MDB CAPP rounds and anticipate DC 1/11, pending adjustments to tach levels.  Anticipate DC w/ oral abx and no needs indicated.    Called pt via phone and updated all demographics.  Pt now lives alone w/ her young son/Logan.  Pt had questions re: Medicaid recert, which this CSW attempted to answer.      No further questions at this time.    Eliezer Bottom

## 2020-02-21 NOTE — Unmapped (Signed)
Nephrology ([facility]) Progress Note    Assessment & Plan:   Elaine Koch is a 35 y.o. 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 Febrile on arrival and found to be COVID positive with no respiratory symptoms.    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 *      AKI on renal transplant (04/2019): Creatinine in the ED 2.41 compared to her baseline levels in the 1.4 range. AKI likely secondary to dehydration from vomiting and diarrhea and poor PO intake given lack of any new nephrotoxic meds. Low suspicion for transplant rejection and no abnormalities on renal transplant ultrasound. Received some fluids on admission with mild improvement in Cr (2.34). AKI may be related to ongoing infections (COVID, bacteremia, UTI). Pre-renal etiology further supported by improvement s/p hydration.     - Encouraging PO intake today  - Continue to trend creatinine  - Treatment for infections as below  - Avoid nephrotoxic medications  - Immunosuppression:     - Tacrolimus per pharmacy, continue 21mg  daily. Transition to sublingual 1/9 if cannot tolerate PO (Trough goal 5-6)    - Continue Prednisone 5mg  daily, hold Myfortic  - Valganciclovir ppx continued     GNR Bacteremia ( Proteus Mirabilis)   Blood cultures from admission 2/2+ for gram negative rods now proteus. Given the species and urine culture, the primary source is likely urine. Pt remains hemodynamically stable at this time and repeat cultures negative to date    - ICID following    - on Ceftrixaone 2g Q 24 hour --- > Levaquin 500mg  24 till 02/28/2020      Leukopenia    Unclear etiology at this point. Possible medication effect vs covid. Noted trend as of yesterday    - CBC w/ Diff tomorrow    - may have to consider stopping valgan    - CMV PCR        UTI/Pyelonephritis  Dysuria and UA with large leuk esterase, nitrites, and bacteria likely represents an active UTI and likely pyelonephritis given back and abdominal pain and fever and tachycardia. Not septic at this time given normal blood pressure and lactate, however she is tachycardic to the low 100s  Prior urine cultures have grown Enterococcus faecalis. Received dose of Vancomycin and Cefepime in ED as well as ampicillin. Urine culture w/ >100k Proteus    - F/u UCx sensitivities  - Continue Cefepime as above --- > Ceftriaxone 2g Q 24 hour --- > Levaquin 500mg  24 till 02/28/2020    COVID in immunosuppressed: Found to be COVID+ in the ED. Vaccinated x3, most recent in 10/2019. Febrile to 39C on admission and tachycardic but stable from a respiratory standpoint with good O2 sats. No respiratory symptoms at present. CXR read with LLL opacity but not impressive. 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 ICID consulted who provided recommendations for COVID therapies  - Remdesivir 200mg  x1 followed by 100mg  daily, will f/u on final course    - Daily monitoring of LFTs  and Cr during therapy    - Baseline CRP ordered  - Monoclonal antibody ordered and received   - F/u Ddimer for anticoagulation plan        Abdominal pain, nausea/vomiting(improving): GI sxs for last 2 days. Not able to keep pills down in ED. Likely related to COVID infection.  -- IVF as above  -- antiemetics PRN  ??  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 No diarrhea at present.  - PO Fidaxomicin per ICID -- > discontinuing today after discussion with Dr Fara Boros + ICID.  ??  Hx T1DM: Not currently using insulin or any DM meds. Glucoses have been WNL since pancreatic transplant.      Daily Checklist:  Diet: Regular Diet  DVT PPx: Heparin 5000units q8h  Electrolytes: Replete Potassium to >/=4 and Magnesium to >/=2  Code Status: Full Code  Dispo: Home    Team Contact Information:   Primary Team: Nephrology ([facility])  Primary Resident: Louie Bun, MD  Resident's Pager: [phone] (Nephrology Intern - Blue)    Interval History:   No acute events overnight.   All other systems were reviewed and are negative except as noted in the HPI    Objective:   Temp:  [36.4 ??C-36.6 ??C] 36.5 ??C  Heart Rate:  [77-95] 87  Resp:  [17-20] 17  BP: (106-136)/(69-82) 120/76  SpO2:  [96 %-98 %] 98 %    Gen: WDWN female in NAD, answers questions appropriately  Heart: regular rate with regular rhythm, S1, S2, no M/R/G, no chest wall tenderness  Lungs: CTAB anteriorly, no crackles or wheezes, no use of accessory muscles  Abdomen: Normoactive bowel sounds, soft, mildly tender in bilateral lower quadrants including over transplant kidney, no rebound/guarding  Extremities: no clubbing, cyanosis, or edema: pulses are +2 in bilateral upper and lower extremities  Neuro: CN II-XI grossly intact. No focal deficits.  Skin:  No rashes, lesions on clothed exam  Psych: Alert, oriented, normal mood and affect.   ??    Labs/Studies: Labs and Studies from the last 24hrs per EMR and Reviewed

## 2020-02-21 NOTE — Unmapped (Signed)
Patient is alert and oriented and able to make her needs known. VSS and in RA. IV medication infused. No Co NV, she says ' she is feeling better today'. Special airborne precaution maintained. Pt ambulating independently. Will continue to monitor her closely.   Problem: Adult Inpatient Plan of Care  Goal: Plan of Care Review  Outcome: Progressing  Goal: Patient-Specific Goal (Individualized)  Outcome: Progressing  Goal: Absence of Hospital-Acquired Illness or Injury  Outcome: Progressing  Intervention: Identify and Manage Fall Risk  Recent Flowsheet Documentation  Taken 02/20/2020 0800 by Milinda Pointer, RN  Safety Interventions:   fall reduction program maintained   low bed  Intervention: Prevent and Manage VTE (Venous Thromboembolism) Risk  Recent Flowsheet Documentation  Taken 02/20/2020 0800 by Milinda Pointer, RN  Activity Management: activity adjusted per tolerance  VTE Prevention/Management:   ambulation promoted   anticoagulant therapy  Goal: Optimal Comfort and Wellbeing  Outcome: Progressing  Goal: Readiness for Transition of Care  Outcome: Progressing  Goal: Rounds/Family Conference  Outcome: Progressing     Problem: Infection  Goal: Absence of Infection Signs and Symptoms  Outcome: Progressing  Intervention: Prevent or Manage Infection  Recent Flowsheet Documentation  Taken 02/20/2020 0800 by Milinda Pointer, RN  Isolation Precautions: airborne precautions maintained     Problem: LTC COVID-19 Confirmed or Rule-Out  Goal: Patient/Resident will remain free of complications due to COVID-19  Description: 1. Review and update the patient/resident's isolation status in the Isolation activity  2. Keep the patient/resident's door closed at all times and limit movement of the patient/resident outside of the room to medically essential purposes. If applicable, transfer patient/resident to a single-person room   3. Educate and reinforce infection prevention and control practices recommended by CDC  4. Frequently monitor for development of more severe symptoms   5. Use appropriate PPE when providing care for patient/resident  6. Reinforce no visitor policy and non-essential health care personnel policy, except for certain compassionate care situations  7. If worsening of symptoms occur, alert the nearest Hospital caring for confirmed COVID-19 patients and arrange for transfer with proper precautions including placing a facemask on the patient/resident during transfer  8. Communicate information about known or suspected case of COVID-19 to appropriate public health personnel  9. Avoid procedures that are likely to induce coughing (e.g., sputum induction, open suctioning of airways). If required, do so in an Airborne Infection Isolation Room. The health care provider in the room should wear an N95 or higher-level respirator, eye protection, gloves, and a gown. The number of HCP present during the procedure should be limited to only those essential for patient/resident care and procedure support. Visitors should not be present for the procedure. Clean and disinfect procedure room surfaces promptly  10. Update patient/resident and family/representatives as needed      Outcome: Progressing

## 2020-02-21 NOTE — Unmapped (Signed)
Tacrolimus Therapeutic Monitoring Pharmacy Note    Elaine Koch is a 35 y.o. female continuing tacrolimus.     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    Date of Transplant:  05/03/2019       Prior Dosing Information: Home regimen 21 mg Envarsus PO daily ; Morning dose on 02/21/20 was held due to super-therapeutic level     Goals:  Therapeutic Drug Levels  Tacrolimus trough goal:  5-6 ng/mL    Additional Clinical Monitoring/Outcomes  Monitor renal function (SCr and urine output) and liver function (LFTs)  Monitor for signs/symptoms of adverse events (e.g., hyperglycemia, hyperkalemia, hypomagnesemia, hypertension, headache, tremor)    Results:   Tacrolimus level:  13.5 ng/mL, drawn appropriately    Pharmacokinetic Considerations and Significant Drug Interactions:  Concurrent hepatotoxic medications: None identified  Concurrent CYP3A4 substrates/inhibitors: None identified  Concurrent nephrotoxic medications: None identified    Assessment/Plan:  Recommendedation(s)  Level is supratherapeutic, more than double upper end of goal  Hold home regimen of Envarsus 21 mg PO daily, repeat level on 02/22/20    Follow-up  Next level has been ordered on 02/22/20 at 0800 .   A pharmacist will continue to monitor and recommend levels as appropriate    Please page service pharmacist with questions/clarifications.    Alberteen Spindle, PharmD Candidate

## 2020-02-21 NOTE — Unmapped (Signed)
Pt alert and oriented x4. Denies pain. Afebrile, VSS on RA. IV Abx given per order. Pt is independent. Falls and airborne precaution maintained. Will continue to monitor.    Problem: Adult Inpatient Plan of Care  Goal: Plan of Care Review  Outcome: Progressing  Goal: Patient-Specific Goal (Individualized)  Outcome: Progressing  Goal: Absence of Hospital-Acquired Illness or Injury  Outcome: Progressing  Intervention: Identify and Manage Fall Risk  Recent Flowsheet Documentation  Taken 02/20/2020 2000 by Willa Frater, RN  Safety Interventions: low bed  Intervention: Prevent and Manage VTE (Venous Thromboembolism) Risk  Recent Flowsheet Documentation  Taken 02/20/2020 2100 by Willa Frater, RN  VTE Prevention/Management: anticoagulant therapy  Intervention: Prevent Infection  Recent Flowsheet Documentation  Taken 02/20/2020 2000 by Willa Frater, RN  Infection Prevention:  ??? environmental surveillance performed  ??? equipment surfaces disinfected  Goal: Optimal Comfort and Wellbeing  Outcome: Progressing  Goal: Readiness for Transition of Care  Outcome: Progressing  Goal: Rounds/Family Conference  Outcome: Progressing     Problem: Infection  Goal: Absence of Infection Signs and Symptoms  Outcome: Progressing  Intervention: Prevent or Manage Infection  Recent Flowsheet Documentation  Taken 02/20/2020 2000 by Willa Frater, RN  Isolation Precautions: airborne precautions maintained     Problem: LTC COVID-19 Confirmed or Rule-Out  Goal: Patient/Resident will remain free of complications due to COVID-19  Description: 1. Review and update the patient/resident's isolation status in the Isolation activity  2. Keep the patient/resident's door closed at all times and limit movement of the patient/resident outside of the room to medically essential purposes. If applicable, transfer patient/resident to a single-person room   3. Educate and reinforce infection prevention and control practices recommended by CDC  4. Frequently monitor for development of more severe symptoms   5. Use appropriate PPE when providing care for patient/resident  6. Reinforce no visitor policy and non-essential health care personnel policy, except for certain compassionate care situations  7. If worsening of symptoms occur, alert the nearest Hospital caring for confirmed COVID-19 patients and arrange for transfer with proper precautions including placing a facemask on the patient/resident during transfer  8. Communicate information about known or suspected case of COVID-19 to appropriate public health personnel  9. Avoid procedures that are likely to induce coughing (e.g., sputum induction, open suctioning of airways). If required, do so in an Airborne Infection Isolation Room. The health care provider in the room should wear an N95 or higher-level respirator, eye protection, gloves, and a gown. The number of HCP present during the procedure should be limited to only those essential for patient/resident care and procedure support. Visitors should not be present for the procedure. Clean and disinfect procedure room surfaces promptly  10. Update patient/resident and family/representatives as needed      Outcome: Progressing

## 2020-02-21 NOTE — Unmapped (Signed)
Problem: Adult Inpatient Plan of Care  Goal: Plan of Care Review  Outcome: Ongoing - Unchanged  Goal: Patient-Specific Goal (Individualized)  Outcome: Ongoing - Unchanged  Goal: Absence of Hospital-Acquired Illness or Injury  Outcome: Ongoing - Unchanged  Intervention: Identify and Manage Fall Risk  Recent Flowsheet Documentation  Taken 02/21/2020 0900 by Suzzette Righter, RN  Safety Interventions: low bed  Intervention: Prevent Infection  Recent Flowsheet Documentation  Taken 02/21/2020 0900 by Suzzette Righter, RN  Infection Prevention:   equipment surfaces disinfected   hand hygiene promoted   single patient room provided   personal protective equipment utilized  Goal: Optimal Comfort and Wellbeing  Outcome: Ongoing - Unchanged  Goal: Readiness for Transition of Care  Outcome: Ongoing - Unchanged  Goal: Rounds/Family Conference  Outcome: Ongoing - Unchanged     Problem: Infection  Goal: Absence of Infection Signs and Symptoms  Outcome: Ongoing - Unchanged  Intervention: Prevent or Manage Infection  Recent Flowsheet Documentation  Taken 02/21/2020 0900 by Suzzette Righter, RN  Infection Management: aseptic technique maintained  Isolation Precautions:   enteric precautions maintained   airborne precautions maintained     Problem: LTC COVID-19 Confirmed or Rule-Out  Goal: Patient/Resident will remain free of complications due to COVID-19  Description: 1. Review and update the patient/resident's isolation status in the Isolation activity  2. Keep the patient/resident's door closed at all times and limit movement of the patient/resident outside of the room to medically essential purposes. If applicable, transfer patient/resident to a single-person room   3. Educate and reinforce infection prevention and control practices recommended by CDC  4. Frequently monitor for development of more severe symptoms   5. Use appropriate PPE when providing care for patient/resident  6. Reinforce no visitor policy and non-essential health care personnel policy, except for certain compassionate care situations  7. If worsening of symptoms occur, alert the nearest Hospital caring for confirmed COVID-19 patients and arrange for transfer with proper precautions including placing a facemask on the patient/resident during transfer  8. Communicate information about known or suspected case of COVID-19 to appropriate public health personnel  9. Avoid procedures that are likely to induce coughing (e.g., sputum induction, open suctioning of airways). If required, do so in an Airborne Infection Isolation Room. The health care provider in the room should wear an N95 or higher-level respirator, eye protection, gloves, and a gown. The number of HCP present during the procedure should be limited to only those essential for patient/resident care and procedure support. Visitors should not be present for the procedure. Clean and disinfect procedure room surfaces promptly  10. Update patient/resident and family/representatives as needed      Outcome: Ongoing - Unchanged

## 2020-02-22 LAB — CBC W/ AUTO DIFF
BASOPHILS ABSOLUTE COUNT: 0 10*9/L (ref 0.0–0.1)
BASOPHILS RELATIVE PERCENT: 0.5 %
EOSINOPHILS ABSOLUTE COUNT: 0.1 10*9/L (ref 0.0–0.4)
EOSINOPHILS RELATIVE PERCENT: 3.3 %
HEMATOCRIT: 35.1 % — ABNORMAL LOW (ref 36.0–46.0)
HEMOGLOBIN: 11.3 g/dL — ABNORMAL LOW (ref 12.0–16.0)
LARGE UNSTAINED CELLS: 2 % (ref 0–4)
LYMPHOCYTES ABSOLUTE COUNT: 0.3 10*9/L — ABNORMAL LOW (ref 1.5–5.0)
LYMPHOCYTES RELATIVE PERCENT: 16.2 %
MEAN CORPUSCULAR HEMOGLOBIN CONC: 32.1 g/dL (ref 31.0–37.0)
MEAN CORPUSCULAR HEMOGLOBIN: 31.4 pg (ref 26.0–34.0)
MEAN CORPUSCULAR VOLUME: 97.7 fL (ref 80.0–100.0)
MEAN PLATELET VOLUME: 9.1 fL (ref 7.0–10.0)
MONOCYTES ABSOLUTE COUNT: 0.2 10*9/L (ref 0.2–0.8)
MONOCYTES RELATIVE PERCENT: 7.5 %
NEUTROPHILS ABSOLUTE COUNT: 1.4 10*9/L — ABNORMAL LOW (ref 2.0–7.5)
NEUTROPHILS RELATIVE PERCENT: 70 %
PLATELET COUNT: 254 10*9/L (ref 150–440)
RED BLOOD CELL COUNT: 3.6 10*12/L — ABNORMAL LOW (ref 4.00–5.20)
RED CELL DISTRIBUTION WIDTH: 15.3 % — ABNORMAL HIGH (ref 12.0–15.0)
WBC ADJUSTED: 2.1 10*9/L — ABNORMAL LOW (ref 4.5–11.0)

## 2020-02-22 LAB — COMPREHENSIVE METABOLIC PANEL
ALBUMIN: 2.9 g/dL — ABNORMAL LOW (ref 3.4–5.0)
ALKALINE PHOSPHATASE: 50 U/L (ref 46–116)
ALT (SGPT): 22 U/L (ref 10–49)
ANION GAP: 6 mmol/L (ref 5–14)
AST (SGOT): 20 U/L (ref <=34)
BILIRUBIN TOTAL: 0.6 mg/dL (ref 0.3–1.2)
BLOOD UREA NITROGEN: 18 mg/dL (ref 9–23)
BUN / CREAT RATIO: 11
CALCIUM: 9.3 mg/dL (ref 8.7–10.4)
CHLORIDE: 106 mmol/L (ref 98–107)
CO2: 27 mmol/L (ref 20.0–31.0)
CREATININE: 1.69 mg/dL — ABNORMAL HIGH
EGFR CKD-EPI AA FEMALE: 46 mL/min/{1.73_m2} — ABNORMAL LOW (ref >=60)
EGFR CKD-EPI NON-AA FEMALE: 40 mL/min/{1.73_m2} — ABNORMAL LOW (ref >=60)
GLUCOSE RANDOM: 99 mg/dL (ref 70–179)
POTASSIUM: 3.4 mmol/L (ref 3.4–4.5)
PROTEIN TOTAL: 6.6 g/dL (ref 5.7–8.2)
SODIUM: 139 mmol/L (ref 135–145)

## 2020-02-22 LAB — TACROLIMUS LEVEL, TROUGH: TACROLIMUS, TROUGH: 6.4 ng/mL (ref 5.0–15.0)

## 2020-02-22 LAB — MAGNESIUM: MAGNESIUM: 1.3 mg/dL — ABNORMAL LOW (ref 1.6–2.6)

## 2020-02-22 MED ORDER — TACROLIMUS XR 4 MG TABLET,EXTENDED RELEASE 24 HR
ORAL_TABLET | Freq: Every day | ORAL | 3 refills | 135.00000 days
Start: 2020-02-22 — End: 2020-03-13

## 2020-02-22 MED ORDER — CIPROFLOXACIN 500 MG TABLET
ORAL_TABLET | Freq: Two times a day (BID) | ORAL | 0 refills | 6.00000 days | Status: CP
Start: 2020-02-22 — End: 2020-02-28

## 2020-02-22 MED ORDER — TACROLIMUS XR 1 MG TABLET,EXTENDED RELEASE 24 HR
ORAL_TABLET | Freq: Every day | ORAL | 11 refills | 30.00000 days
Start: 2020-02-22 — End: 2020-03-13

## 2020-02-22 MED ADMIN — heparin (porcine) 5,000 unit/mL injection 5,000 Units: 5000 [IU] | SUBCUTANEOUS | @ 02:00:00 | Stop: 2020-02-22

## 2020-02-22 MED ADMIN — heparin (porcine) 5,000 unit/mL injection 5,000 Units: 5000 [IU] | SUBCUTANEOUS | @ 19:00:00 | Stop: 2020-02-22

## 2020-02-22 MED ADMIN — tacrolimus (ENVARSUS XR) extended release tablet 18 mg: 18 mg | ORAL | @ 19:00:00 | Stop: 2020-02-22

## 2020-02-22 MED ADMIN — sodium bicarbonate tablet 1,300 mg: 1300 mg | ORAL | @ 02:00:00 | Stop: 2020-02-22

## 2020-02-22 MED ADMIN — sodium bicarbonate tablet 1,300 mg: 1300 mg | ORAL | @ 14:00:00 | Stop: 2020-02-22

## 2020-02-22 MED ADMIN — sodium bicarbonate tablet 1,300 mg: 1300 mg | ORAL | @ 19:00:00 | Stop: 2020-02-22

## 2020-02-22 MED ADMIN — predniSONE (DELTASONE) tablet 5 mg: 5 mg | ORAL | @ 14:00:00 | Stop: 2020-02-22

## 2020-02-22 MED ADMIN — valGANciclovir (VALCYTE) tablet 450 mg: 450 mg | ORAL | @ 14:00:00 | Stop: 2020-02-22

## 2020-02-22 MED ADMIN — ciprofloxacin HCl (CIPRO) tablet 500 mg: 500 mg | ORAL | @ 14:00:00 | Stop: 2020-02-22

## 2020-02-22 MED ADMIN — heparin (porcine) 5,000 unit/mL injection 5,000 Units: 5000 [IU] | SUBCUTANEOUS | @ 11:00:00 | Stop: 2020-02-22

## 2020-02-22 MED ADMIN — aspirin chewable tablet 81 mg: 81 mg | ORAL | @ 14:00:00 | Stop: 2020-02-22

## 2020-02-22 NOTE — Unmapped (Signed)
Pt alert and oriented. Denied pain. VSS. Ambulates independently. On special airborne precautions due to COVID infection. Possible discharge today. Will continue to monitor.    Problem: Adult Inpatient Plan of Care  Goal: Plan of Care Review  Outcome: Progressing  Goal: Patient-Specific Goal (Individualized)  Outcome: Progressing  Goal: Absence of Hospital-Acquired Illness or Injury  Outcome: Progressing  Intervention: Identify and Manage Fall Risk  Recent Flowsheet Documentation  Taken 02/21/2020 1930 by Loretha Brasil, RN  Safety Interventions:  ??? fall reduction program maintained  ??? lighting adjusted for tasks/safety  ??? low bed  ??? nonskid shoes/slippers when out of bed  Intervention: Prevent and Manage VTE (Venous Thromboembolism) Risk  Recent Flowsheet Documentation  Taken 02/21/2020 1930 by Loretha Brasil, RN  Activity Management: activity adjusted per tolerance  Intervention: Prevent Infection  Recent Flowsheet Documentation  Taken 02/21/2020 1930 by Loretha Brasil, RN  Infection Prevention:  ??? cohorting utilized  ??? single patient room provided  Goal: Optimal Comfort and Wellbeing  Outcome: Progressing  Goal: Readiness for Transition of Care  Outcome: Progressing  Goal: Rounds/Family Conference  Outcome: Progressing     Problem: Infection  Goal: Absence of Infection Signs and Symptoms  Outcome: Progressing  Intervention: Prevent or Manage Infection  Recent Flowsheet Documentation  Taken 02/21/2020 1930 by Loretha Brasil, RN  Infection Management: aseptic technique maintained  Isolation Precautions:  ??? enteric precautions maintained  ??? spec airborne/contact precautions maintained     Problem: LTC COVID-19 Confirmed or Rule-Out  Goal: Patient/Resident will remain free of complications due to COVID-19  Description: 1. Review and update the patient/resident's isolation status in the Isolation activity  2. Keep the patient/resident's door closed at all times and limit movement of the patient/resident outside of the room to medically essential purposes. If applicable, transfer patient/resident to a single-person room   3. Educate and reinforce infection prevention and control practices recommended by CDC  4. Frequently monitor for development of more severe symptoms   5. Use appropriate PPE when providing care for patient/resident  6. Reinforce no visitor policy and non-essential health care personnel policy, except for certain compassionate care situations  7. If worsening of symptoms occur, alert the nearest Hospital caring for confirmed COVID-19 patients and arrange for transfer with proper precautions including placing a facemask on the patient/resident during transfer  8. Communicate information about known or suspected case of COVID-19 to appropriate public health personnel  9. Avoid procedures that are likely to induce coughing (e.g., sputum induction, open suctioning of airways). If required, do so in an Airborne Infection Isolation Room. The health care provider in the room should wear an N95 or higher-level respirator, eye protection, gloves, and a gown. The number of HCP present during the procedure should be limited to only those essential for patient/resident care and procedure support. Visitors should not be present for the procedure. Clean and disinfect procedure room surfaces promptly  10. Update patient/resident and family/representatives as needed      Outcome: Progressing

## 2020-02-22 NOTE — Unmapped (Signed)
Physician Discharge Summary [facility]  [facility]  [address]  [place] [address]  Dept: [phone]  Loc: [phone]     Identifying Information:   Elaine Koch  Mar 16, 1985  [id number]    Primary Care Physician: Henderson Cloud, MD     Code Status: Full Code    Admit Date: 02/17/2020    Discharge Date: 02/22/2020     Discharge To: Home    Discharge Service: [facility] - Nephrology Floor Team (MEDB)     Discharge Attending Physician: Walker Kehr, MD    Discharge Diagnoses:   Principal Problem:    AKI (acute kidney injury) (CMS-HCC) POA: Yes  Active Problems:    Type 1 diabetes mellitus (CMS-HCC) POA: Yes    History of simultaneous kidney and pancreas transplant (CMS-HCC) POA: Not Applicable    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 POA: Not Applicable    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 *      Hospital Course:   Elaine Koch is a 35 y.o. 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 Febrile on arrival and found to be COVID positive with no respiratory symptoms      AKI on renal transplant (04/2019):   likely secondary to dehydration from vomiting and diarrhea and poor PO intake given lack of any new nephrotoxic meds. Low suspicion for transplant rejection and no abnormalities on renal transplant ultrasound. Received some fluids on admission with mild improvement in Cr (2.34). AKI may have been related to ongoing infections (COVID, bacteremia, UTI). Pre-renal etiology further supported by improvement s/p hydration. She continued to trend and reached baseline at time of discharge. She did have a noted elevation in her tac level on day two of admission, her tac dosing was adjusted. She was sent out on 18mg  daily and has arranged labs in 1 week.    GNR Bacteremia ( Proteus Mirabilis)   Blood cultures from admission 2/2+ for gram negative rods now proteus. Given the species and urine culture, the primary source is likely urine. Pt remains hemodynamically stable at this time and repeat cultures negative to date. She was discharged on Cipro to end 02/28/2020    Leukopenia  Unclear etiology at this point. Possible medication effect vs covid. She will have follow up CMV labs and we are stopping valycte for now. She will follow up in the outpatient setting    UTI/Pyelonephritis  Dysuria and UA with large leuk esterase, nitrites, and bacteria likely represents an active UTI and likely pyelonephritis given back and abdominal pain and fever and tachycardia. Not septic at this time given normal blood pressure and lactate, however she is tachycardic to the low 100s  Prior urine cultures have grown Enterococcus faecalis. Received dose of Vancomycin and Cefepime in ED as well as ampicillin. Urine culture w/ >100k Proteus. She was treated with IV cefepime and then transitioned to Cipro, her symptoms resolved and she was discharged in stable condition with a prescription for Cipro.    COVID in immunosuppressed: Found to be COVID+ in the ED. Vaccinated x3, most recent in 10/2019. Febrile to 39C on admission and tachycardic but stable from a respiratory standpoint with good O2 sats. No  respiratory symptoms at present. CXR read with LLL opacity but not impressive. 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 ICID consulted who provided recommendations for COVID therapies. She received Remdesivir as treatment. She did not have an oxygen requirement.    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 No diarrhea at present. She was taken of Fidaxomicin as ppx.                       Outpatient Provider Follow Up Issues:   [ ]  Follow up on tac level and dosing    [ ]  follow up on leukopenia ; stopped valycyte and CMV viral load pending    Touchbase with Outpatient Provider:  Warm Handoff: Completed on 02/22/20 by Madolyn Frieze, MD (other: Fellow) via Jolyn Lent Message    Procedures:     ______________________________________________________________________  Discharge Medications:     Your Medication List      STOP taking these medications    mycophenolate 180 MG EC tablet  Commonly known as: MYFORTIC     valGANciclovir 450 mg tablet  Commonly known as: VALCYTE        START taking these medications    ciprofloxacin HCl 500 MG tablet  Commonly known as: CIPRO  Take 1 tablet (500 mg total) by mouth every twelve (12) hours for 6 days.        CHANGE how you take these medications    tacrolimus 4 mg Tb24 extended release tablet  Commonly known as: ENVARSUS XR  Take 4 tablets (16 mg total) by mouth daily. Take 4 tablets to equal 16mg  daily  / (you will use the 1mg  prescription to make your total dose 18mg  this bottle is 4 mg per tablet)  What changed:   ?? how much to take  ?? additional instructions     tacrolimus 1 mg Tb24 extended release tablet  Commonly known as: ENVARSUS XR  Take 1 tablet (1 mg total) by mouth daily. Take 2 tablets of this 1 mg prescription to equal 18 mg total of tacrolimus daily  What changed: additional instructions        CONTINUE taking these medications    aspirin 81 MG tablet  Commonly known as: ECOTRIN  Take 1 tablet (81 mg total) by mouth daily.     biotin 5 mg tablet  Take 1 tablet (5 mg total) by mouth daily.     cholecalciferol (vitamin D3-125 mcg (5,000 unit)) 125 mcg (5,000 unit) capsule  Take 1 capsule (125 mcg total) by mouth daily.     multivitamin, prenatal (folic acid-iron) 27-1 mg Tab  Take 1 tablet by mouth daily.     predniSONE 5 MG tablet  Commonly known as: DELTASONE  Take 1 tablet (5 mg total) by mouth daily.     sodium bicarbonate 650 mg tablet  Take 2 tablets (1,300 mg total) by mouth Three (3) times a day. Allergies:  Iodinated contrast media, Nickel, Propranolol, Eye irrigating solution [ophthalmic irrigation solution], Iodine, Naltrexone, and Uni-cortrom  ______________________________________________________________________  Pending Test Results:  Pending Labs     Order Current Status    CMV DNA, quantitative, PCR In process    Blood Culture #1 Preliminary result    Blood Culture #2 Preliminary result          Most Recent Labs:  All lab results last 24 hours -   Recent Results (from the past 24 hour(s))   Comprehensive Metabolic Panel    Collection Time: 02/22/20  9:58 AM  Result Value Ref Range    Sodium 139 135 - 145 mmol/L    Potassium 3.4 3.4 - 4.5 mmol/L    Chloride 106 98 - 107 mmol/L    Anion Gap 6 5 - 14 mmol/L    CO2 27.0 20.0 - 31.0 mmol/L    BUN 18 9 - 23 mg/dL    Creatinine 5.28 (H) 0.60 - 0.80 mg/dL    BUN/Creatinine Ratio 11     EGFR CKD-EPI Non-African American, Female 40 (L) >=60 mL/min/1.30m2    EGFR CKD-EPI African American, Female 46 (L) >=60 mL/min/1.49m2    Glucose 99 70 - 179 mg/dL    Calcium 9.3 8.7 - 41.3 mg/dL    Albumin 2.9 (L) 3.4 - 5.0 g/dL    Total Protein 6.6 5.7 - 8.2 g/dL    Total Bilirubin 0.6 0.3 - 1.2 mg/dL    AST 20 <=24 U/L    ALT 22 10 - 49 U/L    Alkaline Phosphatase 50 46 - 116 U/L   Magnesium Level    Collection Time: 02/22/20  9:58 AM   Result Value Ref Range    Magnesium 1.3 (L) 1.6 - 2.6 mg/dL   Tacrolimus Level, Trough    Collection Time: 02/22/20  9:58 AM   Result Value Ref Range    Tacrolimus, Trough 6.4 5.0 - 15.0 ng/mL   CBC w/ Differential    Collection Time: 02/22/20  9:58 AM   Result Value Ref Range    WBC 2.1 (L) 4.5 - 11.0 10*9/L    RBC 3.60 (L) 4.00 - 5.20 10*12/L    HGB 11.3 (L) 12.0 - 16.0 g/dL    HCT 40.1 (L) 02.7 - 46.0 %    MCV 97.7 80.0 - 100.0 fL    MCH 31.4 26.0 - 34.0 pg    MCHC 32.1 31.0 - 37.0 g/dL    RDW 25.3 (H) 66.4 - 15.0 %    MPV 9.1 7.0 - 10.0 fL    Platelet 254 150 - 440 10*9/L    Neutrophils % 70.0 %    Lymphocytes % 16.2 % Monocytes % 7.5 %    Eosinophils % 3.3 %    Basophils % 0.5 %    Absolute Neutrophils 1.4 (L) 2.0 - 7.5 10*9/L    Absolute Lymphocytes 0.3 (L) 1.5 - 5.0 10*9/L    Absolute Monocytes 0.2 0.2 - 0.8 10*9/L    Absolute Eosinophils 0.1 0.0 - 0.4 10*9/L    Absolute Basophils 0.0 0.0 - 0.1 10*9/L    Large Unstained Cells 2 0 - 4 %    Macrocytosis Slight (A) Not Present       Relevant Studies/Radiology:  ECG 12 lead (Adult)    Result Date: 02/17/2020  SINUS TACHYCARDIA POSSIBLE LEFT ATRIAL ENLARGEMENT BORDERLINE ECG WHEN COMPARED WITH ECG OF 01-Oct-2019 23:54, NO SIGNIFICANT CHANGE WAS FOUND Confirmed by Pollyann [place] ([id number]) on 02/17/2020 9:13:32 PM    CT Abdomen Pelvis Without Any  Contrast    Result Date: 02/17/2020  EXAM: CT ABDOMEN PELVIS WO CONTRAST DATE: 02/17/2020 6:43 PM ACCESSION: [id number] UN DICTATED: 02/17/2020 6:58 PM INTERPRETATION LOCATION: [facility] CLINICAL INDICATION: 35 year old female with abd pain ; Abdominal pain, acute, nonlocalized  COMPARISON: CT abdomen pelvis 10/02/2019. TECHNIQUE: A spiral CT scan of the abdomen and pelvis was obtained without IV contrast from the lung bases through the pubic symphysis. Images were reconstructed in the axial plane. Coronal and sagittal reformatted images were also provided for further evaluation. FINDINGS: Evaluation of the  solid organs and vasculature is limited in the absence of intravenous contrast. LINES AND TUBES: None. LOWER THORAX: Unremarkable. HEPATOBILIARY: Left hepatic lobe hypertrophy. No focal hepatic lesions. The gallbladder is present and otherwise unremarkable. No biliary dilatation.  SPLEEN: Unremarkable. PANCREAS: Unremarkable. ADRENALS: Unremarkable. KIDNEYS/URETERS: Left lower quadrant transplant kidney. Mildly atrophic native kidneys bilaterally. No hydronephrosis. BLADDER: Mild circumferential bladder wall thickening, which may be secondary to bladder decompression. PELVIC/REPRODUCTIVE ORGANS: Multiple surgical clips pelvis. The uterus is unremarkable. GI TRACT: Anastomotic suture line is noted in the right lower quadrant. No dilated or thick walled loops of bowel. PERITONEUM/RETROPERITONEUM AND MESENTERY: Trace pelvic free fluid which may be physiologic. No free air. LYMPH NODES: No enlarged lymph nodes. VESSELS: Normal in caliber. No significant calcified atherosclerotic disease. BONES AND SOFT TISSUES: No acute osseous abnormality. Postsurgical changes are noted along the anterior abdominal wall.     --Mild circumferential bladder wall thickening, which may be secondary to bladder decompression. There is concern for cystitis, consider obtaining urinalysis. --Otherwise no acute abnormality in the abdomen or pelvis on limited noncontrast exam. --Additional chronic and incidental findings as above.     XR Chest Portable    Result Date: 02/17/2020  EXAM: XR CHEST PORTABLE DATE: 02/17/2020 9:11 PM ACCESSION: [id number] UN DICTATED: 02/17/2020 9:15 PM INTERPRETATION LOCATION: [facility] CLINICAL INDICATION: 35 years old Female with FEVER  COMPARISON: CT chest 10/02/2019. TECHNIQUE: Semiupright portable AP view the chest obtained. FINDINGS: Multiple monitoring wires/tubes project over the chest and upper abdomen obscuring underlying details. The lungs are adequately inflated. There are ill-defined airspace opacities in the lung bases (left greater the right). No evidence of a pneumothorax or pleural effusion. The trachea is midline. The cardiac and mediastinal contours are within normal normal limits for portable technique. No acute osseous abnormality is detected. The surrounding soft tissues tissues and upper abdomen demonstrate no acute abnormality.     There are ill-defined airspace opacities in the lung bases (left greater than right). These may reflect an infectious/inflammatory process, consider Covid pneumonia.    US Renal Transplant W Doppler    Result Date: 02/18/2020  EXAM: US RENAL TRANSPLANT W DOPPLER DATE: 02/18/2020 2:34 AM ACCESSION: [id number] UN DICTATED: 02/18/2020 2:35 AM INTERPRETATION LOCATION: [facility] CLINICAL INDICATION: 35 years old Female with AKI on renal tx COMPARISON: CT abdomen pelvis 02/17/2020, renal transplant ultrasound 10/02/2019 TECHNIQUE:  Ultrasound views of the renal transplant were obtained using gray scale and color and spectral Doppler imaging. Views of the native kidneys and urinary bladder were obtained using gray scale and limited color Doppler imaging. FINDINGS: TRANSPLANTED KIDNEY: The renal transplant was located in the left lower quadrant. Normal size and echogenicity.  No solid masses or calculi. . No hydronephrosis. Unchanged small-volume perinephric slightly complex fluid collection is also again identified measuring 1.9 cm adjacent to the lower pole. VESSELS: - Perfusion: Using power Doppler, normal perfusion was seen throughout the renal parenchyma. - Resistive indices in the renal transplant are stable to slightly increased compared with prior examination but remain within overall normal limits. - Main renal artery/iliac artery: Patent with interval improved peak systolic velocity at the anastomosis measuring 1.8 m/s, previously 2.2 m/s, which is not specific. - Main renal vein/iliac vein: Patent BLADDER: Unremarkable.     No significant change compared with prior study. Stable to slightly increased resistive indices in the renal transplant arteries, within normal limits. Unchanged small-volume perinephric slightly complex fluid collection is also again identified measuring 1.9 cm adjacent to the lower pole. This may represent a small  hematoma or seroma. Please see below for data measurements: Transplant location: LLQ Renal Transplant: length 11.5 cm; width 5.4 cm; height 5.3 cm Arcuate artery superior resistive index: .62 Arcuate artery mid resistive index: .63 Arcuate artery inferior resistive index: .73 Previous resistive indices range of arcuate arteries: .61-.65 Segmental artery superior resistive index: .74 Segmental artery mid resistive index: .70 Segmental artery inferior resistive index: .65 Previous resistive indices range of segmental arteries: .67-.71 Main renal artery hilum resistive index: .69 Main renal artery mid resistive index: .81 Main renal artery anastomosis resistive index: .83 Previous resistive indices range of main renal artery: .73-.81 Main renal vein: patent Iliac artery: Patent Iliac vein: Patent Bladder volume prevoid: 217.6  mL     ______________________________________________________________________  Discharge Instructions:                Follow Up instructions and Outpatient Referrals     Discharge instructions          Appointments which have been scheduled for you    Mar 10, 2020 11:30 AM  (Arrive by 11:00 AM)  RETURN HCP TELEPHONE with Park Breed, MD  [facility] TRANSPLANT INFECTIOUS DISEASES [facility] [facility] REGION) [address]  [place] [address]  [phone]      Mar 31, 2020 10:30 AM  (Arrive by 10:15 AM)  RETURN NEPHROLOGY POST with Brayton Caves, MD  [facility] KIDNEY TRANSPLANT [place] [facility] REGION) [address]  [place] [address]  [phone]      Apr 25, 2020 10:00 AM  (Arrive by 9:45 AM)  NEW  FAMILY PLANNING with Amy Adriana Simas, MD  [facility] FAMILY PLANNING [facility] REGION) [address]  [place] [address]  [phone]      May 12, 2020 11:00 AM  (Arrive by 10:45 AM)  RETURN  GENERAL with Carmon Ginsberg, MD  [facility] GI MEDICINE [place] [facility] [facility] REGION) [address]  [place] [address]  [phone]           ______________________________________________________________________  Discharge Day Services:  BP 149/83  - Pulse 75  - Temp 36.8 ??C (Oral)  - Resp 18  - SpO2 99%     Pt seen on the day of discharge and determined appropriate for discharge.    Condition at Discharge: good    Length of Discharge: I spent greater than 30 mins in the discharge of this patient.

## 2020-02-22 NOTE — Unmapped (Signed)
Problem: Adult Inpatient Plan of Care  Goal: Plan of Care Review  Outcome: Progressing  Goal: Patient-Specific Goal (Individualized)  Outcome: Progressing  Goal: Absence of Hospital-Acquired Illness or Injury  Outcome: Progressing  Intervention: Identify and Manage Fall Risk  Recent Flowsheet Documentation  Taken 02/22/2020 0800 by Suzzette Righter, RN  Safety Interventions:   low bed   isolation precautions   infection management  Intervention: Prevent Infection  Recent Flowsheet Documentation  Taken 02/22/2020 0800 by Suzzette Righter, RN  Infection Prevention:   hand hygiene promoted   personal protective equipment utilized   single patient room provided  Goal: Optimal Comfort and Wellbeing  Outcome: Progressing  Goal: Readiness for Transition of Care  Outcome: Progressing  Goal: Rounds/Family Conference  Outcome: Progressing     Problem: Infection  Goal: Absence of Infection Signs and Symptoms  Outcome: Progressing  Intervention: Prevent or Manage Infection  Recent Flowsheet Documentation  Taken 02/22/2020 0800 by Suzzette Righter, RN  Infection Management: aseptic technique maintained  Isolation Precautions:   spec airborne/contact precautions maintained   enteric precautions maintained     Problem: LTC COVID-19 Confirmed or Rule-Out  Goal: Patient/Resident will remain free of complications due to COVID-19  Description: 1. Review and update the patient/resident's isolation status in the Isolation activity  2. Keep the patient/resident's door closed at all times and limit movement of the patient/resident outside of the room to medically essential purposes. If applicable, transfer patient/resident to a single-person room   3. Educate and reinforce infection prevention and control practices recommended by CDC  4. Frequently monitor for development of more severe symptoms   5. Use appropriate PPE when providing care for patient/resident  6. Reinforce no visitor policy and non-essential health care personnel policy, except for certain compassionate care situations  7. If worsening of symptoms occur, alert the nearest Hospital caring for confirmed COVID-19 patients and arrange for transfer with proper precautions including placing a facemask on the patient/resident during transfer  8. Communicate information about known or suspected case of COVID-19 to appropriate public health personnel  9. Avoid procedures that are likely to induce coughing (e.g., sputum induction, open suctioning of airways). If required, do so in an Airborne Infection Isolation Room. The health care provider in the room should wear an N95 or higher-level respirator, eye protection, gloves, and a gown. The number of HCP present during the procedure should be limited to only those essential for patient/resident care and procedure support. Visitors should not be present for the procedure. Clean and disinfect procedure room surfaces promptly  10. Update patient/resident and family/representatives as needed      Outcome: Progressing

## 2020-02-22 NOTE — Unmapped (Signed)
Reviewed CBC with diff. Would STOP valganciclovir, monitor CMV PCR weekly (pre-emptive monitoring).    Discussed with Dr. Otilio Jefferson C. Michiel Cowboy, MD  Fellow, [facility] Division of Infectious Diseases

## 2020-02-22 NOTE — Unmapped (Signed)
Transplant nephrology update:    43F s/p kidney+pancreas transplant 05/03/19, post-transplant course notable for CMV viremia (on Valcyte, now dosed for secondary ppx) and fecal transplant 02/14/20 for C diff, who was admitted 02/17/20 with abdominal pain, nausea; found to have Proteus bacteremia+UTI and COVID19+ without respiratory symptoms.    Outpatient followup items:  - f/u CMV PCR from 1/10, determine whether further Valcyte is needed  - determine when to resume MMF  - tacrolimus level next week (admission Envarsus dose 21 mg, one dose held, discharge dose 18 mg)    # s/p kidney transplant  Baseline creatinine 1.4-1.8, peak to 2.4 on admission, now back to baseline.  - tacrolimus goal 6-10  - prednisone 5 mg daily  - outpatient MMF is 180 mg BID due to CMV viremia, HOLD at discharge (due to COVID and leukopenia and pending CMV level)    # s/p pancreas transplant   Insulin independent with normal glucoses. Amylase normal (48)    # Proteus bacteremia+UTI (cultures   - ceftriaxone-->levofloxacin, end date 02/28/20    # COVID19 positive  Vaccinated x3, positive test date . Has fever (unclear whether from covid vs bacteremia) and has LLL opacity on CXR, but no respiratory symptoms.  - s/p remdesivir, no dexamethasone  - s/p mAb    # C diff s/p fecal transplant 02/14/20  - fidoxomicin stopped per ICID      Discussed with Dr. Toni Arthurs.

## 2020-02-22 NOTE — Unmapped (Addendum)
Elaine Koch is a 35 y.o. 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 Febrile on arrival and found to be COVID positive with no respiratory symptoms      AKI on renal transplant (04/2019):   likely secondary to dehydration from vomiting and diarrhea and poor PO intake given lack of any new nephrotoxic meds. Low suspicion for transplant rejection and no abnormalities on renal transplant ultrasound. Received some fluids on admission with mild improvement in Cr (2.34). AKI may have been related to ongoing infections (COVID, bacteremia, UTI). Pre-renal etiology further supported by improvement s/p hydration. She continued to trend and reached baseline at time of discharge. She did have a noted elevation in her tac level on day two of admission, her tac dosing was adjusted. She was sent out on 18mg  daily and has arranged labs in 1 week.    GNR Bacteremia ( Proteus Mirabilis)   Blood cultures from admission 2/2+ for gram negative rods now proteus. Given the species and urine culture, the primary source is likely urine. Pt remains hemodynamically stable at this time and repeat cultures negative to date. She was discharged on Cipro to end 02/28/2020    Leukopenia  Unclear etiology at this point. Possible medication effect vs covid. She will have follow up CMV labs and we are stopping valycte for now. She will follow up in the outpatient setting    UTI/Pyelonephritis  Dysuria and UA with large leuk esterase, nitrites, and bacteria likely represents an active UTI and likely pyelonephritis given back and abdominal pain and fever and tachycardia. Not septic at this time given normal blood pressure and lactate, however she is tachycardic to the low 100s  Prior urine cultures have grown Enterococcus faecalis. Received dose of Vancomycin and Cefepime in ED as well as ampicillin. Urine culture w/ >100k Proteus. She was treated with IV cefepime and then transitioned to Cipro, her symptoms resolved and she was discharged in stable condition with a prescription for Cipro.    COVID in immunosuppressed: Found to be COVID+ in the ED. Vaccinated x3, most recent in 10/2019. Febrile to 39C on admission and tachycardic but stable from a respiratory standpoint with good O2 sats. No respiratory symptoms at present. CXR read with LLL opacity but not impressive. 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 ICID consulted who provided recommendations for COVID therapies. She received Remdesivir as treatment. She did not have an oxygen requirement.    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 No diarrhea at present. She was taken of Fidaxomicin as ppx.

## 2020-02-22 NOTE — Unmapped (Signed)
Tacrolimus Therapeutic Monitoring Pharmacy Note    Elaine Koch is a 35 y.o. female continuing tacrolimus.     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    Date of Transplant:  05/03/2019       Prior Dosing Information: Home regimen 21 mg Envarsus PO daily ; Doses on 02/21/20 and 02/22/20 held     Goals:  Therapeutic Drug Levels  Tacrolimus trough goal:  5-6 ng/mL    Additional Clinical Monitoring/Outcomes  Monitor renal function (SCr and urine output) and liver function (LFTs)  Monitor for signs/symptoms of adverse events (e.g., hyperglycemia, hyperkalemia, hypomagnesemia, hypertension, headache, tremor)    Results:   Tacrolimus level: 6.4 ng/mL (48 h post dose)     Pharmacokinetic Considerations and Significant Drug Interactions:  Concurrent hepatotoxic medications: None identified  Concurrent CYP3A4 substrates/inhibitors: None identified  Concurrent nephrotoxic medications: None identified    Assessment/Plan:  Recommendedation(s)  Restart Envarsus at 18 mg daily (decreased from 21 mg daily), first dose to be given 02/22/20 @ 1400    Follow-up  Next level has been ordered on 02/23/20 at 0800 .   A pharmacist will continue to monitor and recommend levels as appropriate    Please page service pharmacist with questions/clarifications.    Alberteen Spindle, PharmD Candidate    Annie Sable, PharmD  PGY1 Pharmacy Resident

## 2020-02-27 LAB — CMV QUANTITATIVE PCR, BLOOD: CMV QUANT (MAYO): NOT DETECTED [IU]/mL

## 2020-02-29 NOTE — Unmapped (Signed)
Spoke with patient, she had diarrhea two nights ago but last night diarrhea had subsided.   I let her know to let me know if diarrhea is consistent two days in a row.  Aleatha Borer, MD MSc  Associate Professor, Gastroenterology and Hepatology  [place] [facility] [place] at [facility]

## 2020-03-01 ENCOUNTER — Telehealth: Admit: 2020-03-01 | Discharge: 2020-03-02 | Payer: MEDICARE

## 2020-03-01 DIAGNOSIS — A0472 Enterocolitis due to Clostridium difficile, not specified as recurrent: Principal | ICD-10-CM

## 2020-03-01 DIAGNOSIS — I10 Essential (primary) hypertension: Principal | ICD-10-CM

## 2020-03-01 DIAGNOSIS — Z940 Kidney transplant status: Principal | ICD-10-CM

## 2020-03-01 DIAGNOSIS — B259 Cytomegaloviral disease, unspecified: Principal | ICD-10-CM

## 2020-03-01 DIAGNOSIS — Z9483 Pancreas transplant status: Principal | ICD-10-CM

## 2020-03-01 MED ORDER — TACROLIMUS XR 4 MG TABLET,EXTENDED RELEASE 24 HR
ORAL_TABLET | Freq: Every day | ORAL | 3 refills | 108.00000 days | Status: CN
Start: 2020-03-01 — End: ?

## 2020-03-01 MED ORDER — TACROLIMUS XR 1 MG TABLET,EXTENDED RELEASE 24 HR
ORAL_TABLET | Freq: Every day | ORAL | 11 refills | 30.00000 days | Status: CN
Start: 2020-03-01 — End: ?

## 2020-03-01 MED FILL — SODIUM BICARBONATE 650 MG TABLET: ORAL | 30 days supply | Qty: 180 | Fill #1

## 2020-03-01 NOTE — Unmapped (Signed)
Transplant Nephrology Clinic Visit      History of Present Illness    Elaine Koch is a 35 y.o. 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 She does not have evidence of donor specific antibodies as of 07/15/2019. Her most recent baseline creatinine is between 1.3-1.5, occasionally up to 1.7.    She was on hemodialysis for about 3 years prior to transplant through an AVF. Her diabetes has been complicated by retinopathy. She had pre-eclampsia during pregnancy in 2017. She also had severe anemia requiring multiple blood transfusions during pregnancy and for some time after. In spite of that she had a cPRA of 0% prior to transplant.    She was admitted from 3/21 through 05/11/19 for the initial transplant. Her course was fairly uncomplicated. On POD 8 did have a transient bump in her creatinine (1.39 to 1.57) and amylase (76 to 112), came down to 1.52 and 89 with fluids. [place] of both kidney and pancreas showed patent vessels and normal RIs. After discharge, she had some sugars in the 120s. On 4/2 reported BP 88/58 so amlodipine was held. At surgery visit 4/8 reported loose stools, which she had pre-transplant. Reglan was decreased. BP 80s/40s so all antihypertensives were held. At visit 4/15 pressures remained low so midodrine 5 mg daily was started, reglan further decreased. Her amylase was 467 at that visit so prednisone 5 mg was started since tacrolimus was sub-therapeutic, with plans to taper off once tac stable and pancreatic enzymes normal. At nephrology visit 4/20, amylase down to 179 and tacrolimus level better on TID dosing. Ureteral stent removed 06/10/19. At visit 5/25 planned transition to Envarsus given high tacrolimus requirement. Amylase remained elevated but stable at 149. Blood pressure improved and was no longer hypotensive.     She called the on-call coordinator on 07/11/19 complaining of headache, nausea and vomiting, diarrhea the night prior. No fever. Was able to keep morning meds down. Recommended bland diet, push fluids. On 6/1 called to report BP 143/110 with chest pain, headache and vomiting and was referred to ED. She was admitted from 6/1 through 07/16/19. On admission she was noted to have a creatinine of 3.22 (from 1.19 on 5/28) in the setting of a tacrolimus level of 27.9. Tacrolimus was held and level came down. She was hypotensive and given IVF. She was found to be C.diff positive and started on vancomycin with plans for 10 day course. Creatinine to 1.32 on discharge. Wbc 0.8 on discharge with ANC 0.5. CMV negative on 6/1. Was discharged on Myfortic 720 mg BID.    On 07/20/19 she was noted to have a wbc < 0.6 and ANC < 0.2. She reported no symptoms and was instructed to hold Myfortic and Valcyte on 6/10 and received a dose of Granix. On 6/10 wbc to 0.6 with ANC 0.3. On 6/16 she reported diarrhea returned after stopping vancomycin, course was extended for an additional 14 days. Wbc improved to 3.9 on 6/14 and then 6.1 on 6/17 so Myfortic restarted at 180 mg BID. Creatinine to 1.56 on 6/17 and 1.49 on 6/21, reported had not been drinking as much since back at work. Wbc 8.6 on 6/21 with ANC 7.5. Most recent lipase has been 90-122.    She followed up with ID on 08/31/19, who placed a referral to Dr. Fara Boros with GI for possible FMT. Patient finished fidaxomicin on 09/06/19 and restarted Vancomycin. When this occurred, patient's diarrhea returned. Patient's vancomycin stopped and she  restarted Fidaxomicin on 09/13/19. She completed her Bezlotoxumab on 09/17/19.     She was admitted from 8/20 through 10/03/19 after presenting with LLQ pain, myalgias, headaches, and low grade fevers. Urine grew 50-100K Enterococcus, no other source found, CT abd/pelvis, TTE and renal US all unremarkable. Treated with 10 day course of linezolid.    Interval since last visit 11/26/19  - fecal transplant 02/14/20 for C diff  - admitted to [facility] 1/6 to 02/22/20 with abdominal pain, nausea; found to have Proteus bacteremia+UTI and COVID19+ without respiratory symptoms (vaccinated x3; positive test date 02/17/20; had fever and a LLL opacity on CXR but no respiratory symptoms)  - Valcyte was held for leukopenia, and CMV pcr 02/21/20 was not detected  - since hospital discharge: no respiratory symptoms. Good energy and appetite. She had a little diarrhea a few days ago (5x BM's that day, normal 2x/day), spoke with her GI doctor.    Review of Systems    Otherwise on review of systems patient denies fever or chills, chest pain, SOB, PND or orthopnea, lower extremity edema. Denies N/V/abdominal pain. No dysuria, hematuria or difficulty voiding. Bowel movements without blood. Denies joint pain or rash. All other systems are reviewed and are negative.    Medications    Current Outpatient Medications   Medication Sig Dispense Refill   ??? aspirin (ECOTRIN) 81 MG tablet Take 1 tablet (81 mg total) by mouth daily. 30 tablet 11   ??? biotin 5 mg tablet Take 1 tablet (5 mg total) by mouth daily. 30 tablet 11   ??? cholecalciferol, vitamin D3-125 mcg, 5,000 unit,, 125 mcg (5,000 unit) capsule Take 1 capsule (125 mcg total) by mouth daily. 100 capsule 10   ??? multivitamin, prenatal, folic acid-iron, 27-1 mg Tab Take 1 tablet by mouth daily.     ??? predniSONE (DELTASONE) 5 MG tablet Take 1 tablet (5 mg total) by mouth daily. 30 tablet 11   ??? sodium bicarbonate 650 mg tablet Take 2 tablets (1,300 mg total) by mouth Three (3) times a day. 180 tablet 3   ??? tacrolimus (ENVARSUS XR) 1 mg Tb24 extended release tablet Take 1 tablet (1 mg total) by mouth daily. Take 2 tablets of this 1 mg prescription to equal 18 mg total of tacrolimus daily 30 tablet 11   ??? tacrolimus (ENVARSUS XR) 4 mg Tb24 extended release tablet Take 4 tablets (16 mg total) by mouth daily. Take 4 tablets to equal 16mg  daily  / (you will use the 1mg  prescription to make your total dose 18mg  this bottle is 4 mg per tablet) 540 tablet 3     No current facility-administered medications for this visit.       Physical Exam  None due to video visit    Laboratory Results    Results for orders placed or performed during the hospital encounter of 02/17/20   RAPID INFLUENZA/RSV/COVID PCR    Specimen: Nasopharyngeal Swab   Result Value Ref Range    Influenza A Negative Negative    Influenza B Negative Negative    RSV Negative Negative    SARS-CoV-2 PCR Positive (A) Negative    SARS-CoV-2 CT Value 18.3    Urine Culture    Specimen: Clean Catch; Urine   Result Value Ref Range    Urine Culture, Comprehensive >100,000 CFU/mL Proteus mirabilis (A)        Susceptibility    Proteus mirabilis - MIC SUSCEPTIBILITY RESULT     Ampicillin  Susceptible  Cefazolin  Resistant      Cephalexin*  Susceptible       * For uncomplicated UTI's only.  Also predicts response for cefdinir, cefpodoxime and cefuroxime.      Ceftazidime  Susceptible      Ceftriaxone  Susceptible      Ciprofloxacin  Susceptible      Gentamicin  Susceptible      Levofloxacin  Susceptible      Nitrofurantoin  Resistant      Piperacillin + Tazobactam  Susceptible      Tetracycline*  Resistant       * Organisms that test susceptible to tetracycline are considered susceptible to doxycycline.However, some organisms that test intermediate or resistant to tetracycline may be susceptible to doxycycline.     Tobramycin  Susceptible      Trimethoprim + Sulfamethoxazole  Susceptible    Blood Culture #1    Specimen: 1 Peripheral Draw; Blood   Result Value Ref Range    Blood Culture, Routine Proteus mirabilis (A)     Gram Stain Result Gram negative rods (bacilli) (AA)    Blood Culture #2    Specimen: 1 Peripheral Draw; Blood   Result Value Ref Range    Blood Culture, Routine Proteus mirabilis (A)     Gram Stain Result Gram negative rods (bacilli) (AA)        Susceptibility    Proteus mirabilis - MIC SUSCEPTIBILITY RESULT     Ampicillin  Susceptible      Cefazolin  Intermediate      Ceftazidime  Susceptible Ceftriaxone  Susceptible      Ciprofloxacin  Susceptible      Gentamicin  Susceptible      Levofloxacin  Susceptible      Piperacillin + Tazobactam  Susceptible      Tobramycin  Susceptible    Blood Culture #1    Specimen: 1 Peripheral Draw; Blood   Result Value Ref Range    Blood Culture, Routine No Growth at 5 days    Blood Culture #2    Specimen: 1 Peripheral Draw; Blood   Result Value Ref Range    Blood Culture, Routine No Growth at 5 days    Comprehensive Metabolic Panel   Result Value Ref Range    Sodium 137 135 - 145 mmol/L    Potassium      Chloride 107 98 - 107 mmol/L    Anion Gap 6 5 - 14 mmol/L    CO2 24.0 20.0 - 31.0 mmol/L    BUN 19 9 - 23 mg/dL    Creatinine 1.61 (H) 0.60 - 0.80 mg/dL    BUN/Creatinine Ratio 8     EGFR CKD-EPI Non-African American, Female 26 (L) >=60 mL/min/1.31m2    EGFR CKD-EPI African American, Female 30 (L) >=60 mL/min/1.14m2    Glucose 105 70 - 179 mg/dL    Calcium 9.7 8.7 - 09.6 mg/dL    Albumin 4.2 3.4 - 5.0 g/dL    Total Protein 8.2 5.7 - 8.2 g/dL    Total Bilirubin 1.4 (H) 0.3 - 1.2 mg/dL    AST      ALT 29 10 - 49 U/L    Alkaline Phosphatase 53 46 - 116 U/L   hCG Quantitative, Blood   Result Value Ref Range    hCG Quantitative <2.6 mIU/mL   Urinalysis with Culture Reflex    Specimen: Clean Catch; Urine   Result Value Ref Range    Color, UA Yellow  Clarity, UA Cloudy     Specific Gravity, UA 1.017 1.003 - 1.030    pH, UA 8.0 5.0 - 9.0    Leukocyte Esterase, UA Large (A) Negative    Nitrite, UA Positive (A) Negative    Protein, UA 100 mg/dL (A) Negative    Glucose, UA Negative Negative    Ketones, UA 20 mg/dL (A) Negative    Urobilinogen, UA 0.2 mg/dL 0.2 mg/dL, 1.0 mg/dL    Bilirubin, UA Negative Negative    Blood, UA Negative Negative    RBC, UA 1 <=4 /HPF    WBC, UA 3 0 - 5 /HPF    Squam Epithel, UA 3 0 - 5 /HPF    Bacteria, UA Rare (A) None Seen /HPF    Triple phosphate Crystal, UA Rare /HPF    Mucus, UA Rare (A) None Seen /HPF   PT-INR   Result Value Ref Range    PT 12.6 10.3 - 13.4 sec    INR 1.08    APTT   Result Value Ref Range    APTT 34.3 24.9 - 36.9 sec    Heparin Correlation 0.2    Magnesium Level   Result Value Ref Range    Magnesium 1.5 (L) 1.6 - 2.6 mg/dL   Blood Gas Critical Care Panel, Venous   Result Value Ref Range    Specimen Source Venous     FIO2 Venous Room Air     pH, Venous 7.39 7.32 - 7.43    pCO2, Ven 39 (L) 40 - 60 mm Hg    pO2, Ven 27 (L) 30 - 55 mm Hg    HCO3, Ven 23 22 - 27 mmol/L    Base Excess, Ven -1.5 -2.0 - 2.0    O2 Saturation, Venous 44.8 40.0 - 85.0 %    Sodium Whole Blood 141 135 - 145 mmol/L    Potassium, Bld 6.9 (HH) 3.4 - 4.6 mmol/L    Calcium, Ionized Venous 4.14 (L) 4.40 - 5.40 mg/dL    Glucose Whole Blood 98 70 - 179 mg/dL    Lactate, Venous 1.1 0.5 - 1.8 mmol/L    Hgb, blood gas 12.70 12.00 - 16.00 g/dL   Potassium Level   Result Value Ref Range    Potassium 4.5 3.4 - 4.5 mmol/L   AST   Result Value Ref Range    AST 22 <=34 U/L   Potassium Level   Result Value Ref Range    Potassium 4.4 3.4 - 4.5 mmol/L   Tacrolimus Level, Trough   Result Value Ref Range    Tacrolimus, Trough 7.4 5.0 - 15.0 ng/mL   Basic metabolic panel   Result Value Ref Range    Sodium 137 135 - 145 mmol/L    Potassium 4.6 (H) 3.4 - 4.5 mmol/L    Chloride 106 98 - 107 mmol/L    CO2 25.0 20.0 - 31.0 mmol/L    Anion Gap 6 5 - 14 mmol/L    BUN 21 9 - 23 mg/dL    Creatinine 1.61 (H) 0.60 - 0.80 mg/dL    BUN/Creatinine Ratio 9     EGFR CKD-EPI Non-African American, Female 27 (L) >=60 mL/min/1.34m2    EGFR CKD-EPI African American, Female 31 (L) >=60 mL/min/1.59m2    Glucose 114 70 - 179 mg/dL    Calcium 9.0 8.7 - 09.6 mg/dL   Magnesium Level   Result Value Ref Range    Magnesium 1.3 (L) 1.6 - 2.6 mg/dL   C-reactive  protein   Result Value Ref Range    CRP 105.0 (H) <=10.0 mg/L   C-reactive protein   Result Value Ref Range    CRP 228.0 (H) <=10.0 mg/L   D-Dimer, Quantitative   Result Value Ref Range    D-Dimer 990 (H) <=500 ng/mL FEU   Amylase Level   Result Value Ref Range Amylase 48 30 - 118 U/L   Lipase Level   Result Value Ref Range    Lipase 17 12 - 53 U/L   Bilirubin, Direct   Result Value Ref Range    Bilirubin, Direct 0.20 0.00 - 0.30 mg/dL   Tacrolimus Level, Trough   Result Value Ref Range    Tacrolimus, Trough 11.6 5.0 - 15.0 ng/mL   Comprehensive Metabolic Panel   Result Value Ref Range    Sodium 136 135 - 145 mmol/L    Potassium 4.3 3.4 - 4.5 mmol/L    Chloride 103 98 - 107 mmol/L    Anion Gap 7 5 - 14 mmol/L    CO2 26.0 20.0 - 31.0 mmol/L    BUN 19 9 - 23 mg/dL    Creatinine 1.61 (H) 0.60 - 0.80 mg/dL    BUN/Creatinine Ratio 12     EGFR CKD-EPI Non-African American, Female 42 (L) >=60 mL/min/1.15m2    EGFR CKD-EPI African American, Female 48 (L) >=60 mL/min/1.30m2    Glucose 96 70 - 179 mg/dL    Calcium 9.3 8.7 - 09.6 mg/dL    Albumin 2.9 (L) 3.4 - 5.0 g/dL    Total Protein 6.7 5.7 - 8.2 g/dL    Total Bilirubin 0.6 0.3 - 1.2 mg/dL    AST 14 <=04 U/L    ALT 18 10 - 49 U/L    Alkaline Phosphatase 48 46 - 116 U/L   Magnesium Level   Result Value Ref Range    Magnesium 1.6 1.6 - 2.6 mg/dL   Comprehensive Metabolic Panel   Result Value Ref Range    Sodium 138 135 - 145 mmol/L    Potassium 3.7 3.4 - 4.5 mmol/L    Chloride 105 98 - 107 mmol/L    Anion Gap 6 5 - 14 mmol/L    CO2 27.0 20.0 - 31.0 mmol/L    BUN 18 9 - 23 mg/dL    Creatinine 5.40 (H) 0.60 - 0.80 mg/dL    BUN/Creatinine Ratio 12     EGFR CKD-EPI Non-African American, Female 44 (L) >=60 mL/min/1.82m2    EGFR CKD-EPI African American, Female 51 (L) >=60 mL/min/1.78m2    Glucose 94 70 - 179 mg/dL    Calcium 9.3 8.7 - 98.1 mg/dL    Albumin 2.9 (L) 3.4 - 5.0 g/dL    Total Protein 6.7 5.7 - 8.2 g/dL    Total Bilirubin 0.6 0.3 - 1.2 mg/dL    AST 20 <=19 U/L    ALT 23 10 - 49 U/L    Alkaline Phosphatase 48 46 - 116 U/L   Magnesium Level   Result Value Ref Range    Magnesium 1.5 (L) 1.6 - 2.6 mg/dL   Tacrolimus Level, Trough   Result Value Ref Range    Tacrolimus, Trough 13.5 5.0 - 15.0 ng/mL   Comprehensive Metabolic Panel   Result Value Ref Range    Sodium 139 135 - 145 mmol/L    Potassium 3.4 3.4 - 4.5 mmol/L    Chloride 106 98 - 107 mmol/L    Anion Gap 6 5 - 14 mmol/L  CO2 27.0 20.0 - 31.0 mmol/L    BUN 18 9 - 23 mg/dL    Creatinine 2.95 (H) 0.60 - 0.80 mg/dL    BUN/Creatinine Ratio 11     EGFR CKD-EPI Non-African American, Female 40 (L) >=60 mL/min/1.34m2    EGFR CKD-EPI African American, Female 46 (L) >=60 mL/min/1.78m2    Glucose 99 70 - 179 mg/dL    Calcium 9.3 8.7 - 62.1 mg/dL    Albumin 2.9 (L) 3.4 - 5.0 g/dL    Total Protein 6.6 5.7 - 8.2 g/dL    Total Bilirubin 0.6 0.3 - 1.2 mg/dL    AST 20 <=30 U/L    ALT 22 10 - 49 U/L    Alkaline Phosphatase 50 46 - 116 U/L   Magnesium Level   Result Value Ref Range    Magnesium 1.3 (L) 1.6 - 2.6 mg/dL   Tacrolimus Level, Trough   Result Value Ref Range    Tacrolimus, Trough 6.4 5.0 - 15.0 ng/mL   CMV Quantitative PCR, Blood   Result Value Ref Range    CMV Quant Undetected Undetected IU/mL   ECG 12 lead (Adult)   Result Value Ref Range    EKG Systolic BP  mmHg    EKG Diastolic BP  mmHg    EKG Ventricular Rate 111 BPM    EKG Atrial Rate 111 BPM    EKG P-R Interval 138 ms    EKG QRS Duration 76 ms    EKG Q-T Interval 334 ms    EKG QTC Calculation 454 ms    EKG Calculated P Axis 66 degrees    EKG Calculated R Axis -2 degrees    EKG Calculated T Axis 68 degrees    QTC Fredericia 410 ms   CBC w/ Differential   Result Value Ref Range    WBC 5.0 4.5 - 11.0 10*9/L    RBC 4.13 4.00 - 5.20 10*12/L    HGB 13.1 12.0 - 16.0 g/dL    HCT 86.5 78.4 - 69.6 %    MCV 97.1 80.0 - 100.0 fL    MCH 31.8 26.0 - 34.0 pg    MCHC 32.7 31.0 - 37.0 g/dL    RDW 29.5 (H) 28.4 - 15.0 %    MPV 9.1 7.0 - 10.0 fL    Platelet 251 150 - 440 10*9/L    Neutrophils % 91.1 %    Lymphocytes % 5.8 %    Monocytes % 1.2 %    Eosinophils % 0.7 %    Basophils % 0.8 %    Neutrophil Left Shift 2+ (A) Not Present    Absolute Neutrophils 4.6 2.0 - 7.5 10*9/L    Absolute Lymphocytes 0.3 (L) 1.5 - 5.0 10*9/L    Absolute Monocytes 0.1 (L) 0.2 - 0.8 10*9/L    Absolute Eosinophils 0.0 0.0 - 0.4 10*9/L    Absolute Basophils 0.0 0.0 - 0.1 10*9/L    Large Unstained Cells 1 0 - 4 %    Macrocytosis Slight (A) Not Present   Morphology Review   Result Value Ref Range    Smear Review Comments See Comment (A) Undefined   CBC w/ Differential   Result Value Ref Range    WBC 8.2 4.5 - 11.0 10*9/L    RBC 3.73 (L) 4.00 - 5.20 10*12/L    HGB 11.9 (L) 12.0 - 16.0 g/dL    HCT 13.2 (L) 44.0 - 46.0 %    MCV 96.3 80.0 - 100.0 fL  MCH 31.9 26.0 - 34.0 pg    MCHC 33.1 31.0 - 37.0 g/dL    RDW 29.5 (H) 62.1 - 15.0 %    MPV 8.9 7.0 - 10.0 fL    Platelet 207 150 - 440 10*9/L    Neutrophils % 92.5 %    Lymphocytes % 4.5 %    Monocytes % 1.9 %    Eosinophils % 0.2 %    Basophils % 0.5 %    Neutrophil Left Shift 2+ (A) Not Present    Absolute Neutrophils 7.6 (H) 2.0 - 7.5 10*9/L    Absolute Lymphocytes 0.4 (L) 1.5 - 5.0 10*9/L    Absolute Monocytes 0.2 0.2 - 0.8 10*9/L    Absolute Eosinophils 0.0 0.0 - 0.4 10*9/L    Absolute Basophils 0.0 0.0 - 0.1 10*9/L    Large Unstained Cells 0 0 - 4 %    Macrocytosis Slight (A) Not Present   CBC w/ Differential   Result Value Ref Range    WBC 3.5 (L) 4.5 - 11.0 10*9/L    RBC 3.64 (L) 4.00 - 5.20 10*12/L    HGB 11.4 (L) 12.0 - 16.0 g/dL    HCT 30.8 (L) 65.7 - 46.0 %    MCV 97.9 80.0 - 100.0 fL    MCH 31.4 26.0 - 34.0 pg    MCHC 32.1 31.0 - 37.0 g/dL    RDW 84.6 (H) 96.2 - 15.0 %    MPV 9.5 7.0 - 10.0 fL    Platelet 217 150 - 440 10*9/L    Neutrophils % 79.5 %    Lymphocytes % 9.5 %    Monocytes % 4.0 %    Eosinophils % 4.6 %    Basophils % 0.6 %    Neutrophil Left Shift 1+ (A) Not Present    Absolute Neutrophils 2.8 2.0 - 7.5 10*9/L    Absolute Lymphocytes 0.3 (L) 1.5 - 5.0 10*9/L    Absolute Monocytes 0.1 (L) 0.2 - 0.8 10*9/L    Absolute Eosinophils 0.2 0.0 - 0.4 10*9/L    Absolute Basophils 0.0 0.0 - 0.1 10*9/L    Large Unstained Cells 2 0 - 4 %    Macrocytosis Slight (A) Not Present   CBC w/ Differential   Result Value Ref Range WBC 2.3 (L) 4.5 - 11.0 10*9/L    RBC 3.64 (L) 4.00 - 5.20 10*12/L    HGB 11.5 (L) 12.0 - 16.0 g/dL    HCT 95.2 (L) 84.1 - 46.0 %    MCV 97.7 80.0 - 100.0 fL    MCH 31.7 26.0 - 34.0 pg    MCHC 32.4 31.0 - 37.0 g/dL    RDW 32.4 (H) 40.1 - 15.0 %    MPV 9.4 7.0 - 10.0 fL    Platelet 239 150 - 440 10*9/L    Neutrophils % 70.9 %    Lymphocytes % 19.2 %    Monocytes % 2.5 %    Eosinophils % 5.1 %    Basophils % 0.4 %    Absolute Neutrophils 1.6 (L) 2.0 - 7.5 10*9/L    Absolute Lymphocytes 0.4 (L) 1.5 - 5.0 10*9/L    Absolute Monocytes 0.1 (L) 0.2 - 0.8 10*9/L    Absolute Eosinophils 0.1 0.0 - 0.4 10*9/L    Absolute Basophils 0.0 0.0 - 0.1 10*9/L    Large Unstained Cells 2 0 - 4 %    Macrocytosis Slight (A) Not Present   CBC w/ Differential   Result Value  Ref Range    WBC 2.1 (L) 4.5 - 11.0 10*9/L    RBC 3.60 (L) 4.00 - 5.20 10*12/L    HGB 11.3 (L) 12.0 - 16.0 g/dL    HCT 16.1 (L) 09.6 - 46.0 %    MCV 97.7 80.0 - 100.0 fL    MCH 31.4 26.0 - 34.0 pg    MCHC 32.1 31.0 - 37.0 g/dL    RDW 04.5 (H) 40.9 - 15.0 %    MPV 9.1 7.0 - 10.0 fL    Platelet 254 150 - 440 10*9/L    Neutrophils % 70.0 %    Lymphocytes % 16.2 %    Monocytes % 7.5 %    Eosinophils % 3.3 %    Basophils % 0.5 %    Absolute Neutrophils 1.4 (L) 2.0 - 7.5 10*9/L    Absolute Lymphocytes 0.3 (L) 1.5 - 5.0 10*9/L    Absolute Monocytes 0.2 0.2 - 0.8 10*9/L    Absolute Eosinophils 0.1 0.0 - 0.4 10*9/L    Absolute Basophils 0.0 0.0 - 0.1 10*9/L    Large Unstained Cells 2 0 - 4 %    Macrocytosis Slight (A) Not Present       Assessment and Plan      1. Status post renal transplant. Baseline creatinine 1.4-1.8, peak to 2.4 on admission but was back to baseline by discharge.  - tacrolimus goal 6-10; discharge Envarsus dose was 18 mg daily, need followup tacrolimus trough level to evaluate the new dose (she will have it drawn tomorrow)  - prednisone 5 mg daily  - outpatient MMF is 180 mg BID due to CMV viremia, HELD at discharge (due to COVID and leukopenia and pending CMV level). CMV is nondetectable, will likely resume MMF at 180 mg if tomorrow's WBC is acceptable.    2. Status post pancreas transplant. She has had a mildly elevated amylase since transplant. Reduced to 48 on 02/18/20. Peaked at 467 on 05/27/19.Hgb A1c reassuring at 4.3% on 10/13/19.    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 CMV down to 100 on 12/10/19 and nondetectable on 02/21/20.  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 Suspect due to decreased immunosuppression in the setting of infection. Will continue to follow.    5. History of hypertension. Blood pressure reasonable off meds, may consider adding ACEi/ARB at some point given history of diabetes.    6. Chronic C.diff, now s/p fecal transplant with Dr. Fara Boros on 02/14/20. Fidoxomicin stopped per ICID.    7. Childbearing age. She is aware that Myfortic is teratogenic. Has plans to place IUD locally.    8. Health maintenance. She received a flu shot 11/26/19. She has had a Pnemovax vaccine, needs Prevnar 13. Received both Covid vaccines and booster.     9. Will see patient back in 3 months, or sooner if needed.

## 2020-03-01 NOTE — Unmapped (Signed)
[facility] Refill Coordination Note    Specialty Medication(s) to be Shipped:   Transplant: Envarsus 1mg  and Envarsus 4mg     Other medication(s) to be shipped: sodium bicarb     Elaine Koch, DOB: [birth year]  Phone: [phone] (home)       All above HIPAA information was verified with patient.     Was a Nurse, learning disability used for this call? No    Completed refill call assessment today to schedule patient's medication shipment from the [facility] [phone]).       Specialty medication(s) and dose(s) confirmed: Patient reports changes to the regimen as follows: Envarsus is now 18mg  daily **sent rf request**   Changes to medications: Symphani reports no changes at this time.  Changes to insurance: No  Questions for the pharmacist: No    Confirmed patient received Welcome Packet with first shipment. The patient will receive a drug information handout for each medication shipped and additional FDA Medication Guides as required.       DISEASE/MEDICATION-SPECIFIC INFORMATION        N/A    SPECIALTY MEDICATION ADHERENCE     Medication Adherence    Patient reported X missed doses in the last month: 0  Specialty Medication: Envarsus 1mg   Patient is on additional specialty medications: Yes  Additional Specialty Medications: Envarsus 4mg   Patient Reported Additional Medication X Missed Doses in the Last Month: 0  Patient is on more than two specialty medications: No        Envarsus 1 mg: 7 days of medicine on hand   Envarsus 4 mg: 7 days of medicine on hand     SHIPPING     Shipping address confirmed in Epic.     Delivery Scheduled: Yes, Expected medication delivery date: 03/06/2020.      **Sodium bicarb delivery on 03/02/2020**     Medication will be delivered via UPS to the prescription address in Epic WAM.    Lorelei Pont [facility] Specialty Technician

## 2020-03-02 DIAGNOSIS — Z940 Kidney transplant status: Principal | ICD-10-CM

## 2020-03-02 MED ORDER — TACROLIMUS XR 1 MG TABLET,EXTENDED RELEASE 24 HR
ORAL_TABLET | Freq: Every day | ORAL | 11 refills | 30.00000 days
Start: 2020-03-02 — End: ?

## 2020-03-02 MED ORDER — TACROLIMUS XR 4 MG TABLET,EXTENDED RELEASE 24 HR
ORAL_TABLET | Freq: Every day | ORAL | 3 refills | 108 days
Start: 2020-03-02 — End: ?

## 2020-03-03 DIAGNOSIS — Z940 Kidney transplant status: Principal | ICD-10-CM

## 2020-03-03 LAB — CBC W/ DIFFERENTIAL
BANDED NEUTROPHILS ABSOLUTE COUNT: 0.1 10*3/uL (ref 0.0–0.1)
BASOPHILS ABSOLUTE COUNT: 0.1 10*3/uL (ref 0.0–0.2)
BASOPHILS RELATIVE PERCENT: 1 %
EOSINOPHILS ABSOLUTE COUNT: 0.1 10*3/uL (ref 0.0–0.4)
EOSINOPHILS RELATIVE PERCENT: 2 %
HEMATOCRIT: 31.2 % — ABNORMAL LOW (ref 34.0–46.6)
HEMOGLOBIN: 10.6 g/dL — ABNORMAL LOW (ref 11.1–15.9)
IMMATURE GRANULOCYTES: 1 %
LYMPHOCYTES ABSOLUTE COUNT: 0.7 10*3/uL (ref 0.7–3.1)
LYMPHOCYTES RELATIVE PERCENT: 20 %
MEAN CORPUSCULAR HEMOGLOBIN CONC: 34 g/dL (ref 31.5–35.7)
MEAN CORPUSCULAR HEMOGLOBIN: 31 pg (ref 26.6–33.0)
MEAN CORPUSCULAR VOLUME: 91 fL (ref 79–97)
MONOCYTES ABSOLUTE COUNT: 0.9 10*3/uL (ref 0.1–0.9)
MONOCYTES RELATIVE PERCENT: 25 %
NEUTROPHILS ABSOLUTE COUNT: 1.8 10*3/uL (ref 1.4–7.0)
NEUTROPHILS RELATIVE PERCENT: 51 %
PLATELET COUNT: 384 10*3/uL (ref 150–450)
RED BLOOD CELL COUNT: 3.42 x10E6/uL — ABNORMAL LOW (ref 3.77–5.28)
RED CELL DISTRIBUTION WIDTH: 15 % (ref 11.7–15.4)
WHITE BLOOD CELL COUNT: 3.5 10*3/uL (ref 3.4–10.8)

## 2020-03-03 LAB — BASIC METABOLIC PANEL
BLOOD UREA NITROGEN: 31 mg/dL — ABNORMAL HIGH (ref 6–20)
BUN / CREAT RATIO: 18 (ref 9–23)
CALCIUM: 9.5 mg/dL (ref 8.7–10.2)
CHLORIDE: 109 mmol/L — ABNORMAL HIGH (ref 96–106)
CO2: 21 mmol/L (ref 20–29)
CREATININE: 1.76 mg/dL — ABNORMAL HIGH (ref 0.57–1.00)
GLUCOSE: 87 mg/dL (ref 65–99)
POTASSIUM: 4.9 mmol/L (ref 3.5–5.2)
SODIUM: 142 mmol/L (ref 134–144)

## 2020-03-03 LAB — PHOSPHORUS: PHOSPHORUS, SERUM: 3.9 mg/dL (ref 3.0–4.3)

## 2020-03-03 LAB — MAGNESIUM: MAGNESIUM: 1.6 mg/dL (ref 1.6–2.3)

## 2020-03-03 MED ORDER — MYCOPHENOLATE SODIUM 180 MG TABLET,DELAYED RELEASE
ORAL_TABLET | Freq: Two times a day (BID) | ORAL | 11 refills | 30.00000 days | Status: CP
Start: 2020-03-03 — End: 2020-03-27
  Filled 2020-03-07: qty 60, 30d supply, fill #0

## 2020-03-03 NOTE — Unmapped (Signed)
WBC reviewed by Dr True patient. Advised to restart myfortic 180 mg BID. Patient verbalize understanding.

## 2020-03-06 DIAGNOSIS — Z940 Kidney transplant status: Principal | ICD-10-CM

## 2020-03-06 DIAGNOSIS — D849 Immunodeficiency, unspecified: Principal | ICD-10-CM

## 2020-03-06 LAB — TACROLIMUS LEVEL: TACROLIMUS BLOOD: 5.9 ng/mL (ref 2.0–20.0)

## 2020-03-07 NOTE — Unmapped (Signed)
Patient tacrolimus level 5.9 reviewed with Dr Carlene Coria. No changes given.

## 2020-03-07 NOTE — Unmapped (Signed)
[facility] Refill Coordination Note    Specialty Medication(s) to be Shipped:   Transplant:  mycophenolic acid 180mg     Other medication(s) to be shipped: No additional medications requested for fill at this time     Elaine Koch, DOB: 1986/02/06  Phone: [phone] (home)       All above HIPAA information was verified with patient.     Was a Nurse, learning disability used for this call? No    Completed refill call assessment today to schedule patient's medication shipment from the [facility] [phone]).       Specialty medication(s) and dose(s) confirmed: Regimen is correct and unchanged.   Changes to medications: Elaine Koch reports no changes at this time.  Changes to insurance: No  Questions for the pharmacist: No    Confirmed patient received Welcome Packet with first shipment. The patient will receive a drug information handout for each medication shipped and additional FDA Medication Guides as required.       DISEASE/MEDICATION-SPECIFIC INFORMATION        N/A    SPECIALTY MEDICATION ADHERENCE     Medication Adherence    Patient reported X missed doses in the last month: 0  Specialty Medication: mycophenolate 180mg                 Mycophenolate 180mg   : 2 days of medicine on hand         SHIPPING     Shipping address confirmed in Epic.     Delivery Scheduled: Yes, Expected medication delivery date: 1/26.     Medication will be delivered via UPS to the prescription address in Epic WAM.    Westley Gambles   [facility] Specialty Technician

## 2020-03-08 DIAGNOSIS — G8929 Other chronic pain: Principal | ICD-10-CM

## 2020-03-08 DIAGNOSIS — R109 Unspecified abdominal pain: Principal | ICD-10-CM

## 2020-03-10 ENCOUNTER — Institutional Professional Consult (permissible substitution)
Admit: 2020-03-10 | Discharge: 2020-03-11 | Payer: MEDICARE | Attending: Infectious Disease | Primary: Infectious Disease

## 2020-03-10 DIAGNOSIS — A498 Other bacterial infections of unspecified site: Principal | ICD-10-CM

## 2020-03-10 DIAGNOSIS — B259 Cytomegaloviral disease, unspecified: Principal | ICD-10-CM

## 2020-03-10 DIAGNOSIS — N390 Urinary tract infection, site not specified: Principal | ICD-10-CM

## 2020-03-10 MED ORDER — FIDAXOMICIN 200 MG TABLET
ORAL_TABLET | Freq: Two times a day (BID) | ORAL | 0 refills | 10 days | Status: CP
Start: 2020-03-10 — End: ?
  Filled 2020-03-23: qty 20, 10d supply, fill #0

## 2020-03-10 NOTE — Unmapped (Signed)
IMMUNOCOMPROMISED HOST INFECTIOUS DISEASE PROGRESS NOTE        I spent 15 minutes on the real-time audio and video visit with the patient on the date of service. I spent an additional 20 minutes on pre- and post-visit activities on the date of service.     The patient was not located and I was not located within 250 yards of a hospital based location during the real-time audio and video visit. The patient was physically located in [place] or a state in which I am permitted to provide care. The patient and/or parent/guardian understood that s/he may incur co-pays and cost sharing, and agreed to the telemedicine visit. The visit was reasonable and appropriate under the circumstances given the patient's presentation at the time.    The patient and/or parent/guardian has been advised of the potential risks and limitations of this mode of treatment (including, but not limited to, the absence of in-person examination) and has agreed to be treated using telemedicine. The patient's/patient's family's questions regarding telemedicine have been answered.    If the visit was completed in an ambulatory setting, the patient and/or parent/guardian has also been advised to contact their provider???s office for worsening conditions, and seek emergency medical treatment and/or call 911 if the patient deems either necessary.    Assessment/Plan:     Elaine Koch is a 35 y.o. female who presents over video for CMV follow up.     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 10/13/2019??A1C 4.3??  ??  #Sickle cell trait  ??  # Mild post-transplant CKD  - baseline Cr 1.6  ??  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, clinical relapse 07/28/2019, 08/23/2019, 09/06/2019, 11/29/19, 03/06/20  - sx recurred after vanc tx, retreated with vanc and taper but relapsed dropping from vanc QID to??vancomycin??Q2days  - 08/27/19-7/26??fidaxomicin  - 8/6 bezlotoxumab  - 8/2 relapsed on vancomycin taper -> 8/4 change to fidaxomicin once daily for suppression -> 8/31 vanc taper --> around 10/12 tapered from daily to twice weekly (relapsed symptoms) --> 11/28/19 vancomycin 125mg  QID -> 11/30/2019 fidaxomicin 200mg  bid -> 11/12 fidaxomicin daily (until 12/31)  - 02/14/2020 FMT (complicated by Proteus bacteremia/UTI 1/6)  - 1/24 vanc BID -> 1/28 vanc qid    Prior infections  # 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 faecalis UTI 10/01/19  # Proteus (pan-S ex I-cefazolin) UTI/bacteremia 02/17/20  ??  Recommendations:  - Increase vancomycin 125mg  from BID to QID now until she can get fidaxomicin filled.  - I sent in a 10 days course of fidaxomicin that she can follow with vancomycin 125mg  1-2 times per day for maintenance  - I anticipate that she will need repeat FMT  - 1g D-mannose after intercourse, repeat once ~12h later (trial to see if this decreases UTIs)  - CMV PCR at least q2wks for ~3 months after stopping  - HPV-9 #2 at next in person visit; Moderna booster in March 2022    Follow up in ~1 month    Subjective   Interval History:    Interval history: I reviewed records describing FMT on 1/3 complicated by Proteus bacteremia and UTI on 1/6 that required hospitalization. She had occasional diarrhea 1/18.   Diarrhea  started back regularly like previous C diff episodes on 1/24, and she restarted vancomycin BID. BID works ok but not good enough.  No UTI symptoms. No fever or abdominal cramping.   She is sexually active - not sure if it is associated with UTI. No vaginal or oral yeast symptoms.     Medications:  Antimicrobials:  vanc 125mg  bid since 1/24    Prior/Current immunomodulators:  Myfortic 180mg  bid  tacro   pred 5  Other medications reviewed.    Objective     Vital signs:  There were no vitals taken for this visit.    Physical Exam:  GEN:  looks well, no apparent distress  EYES: sclerae anicteric and non injected   ENT: MMM, no thrush  PULM: normal WOB on RA  SKIN: no rash on limited skin exam  NEURO:no tremor noted and facial expression symmetric  PSYCH:attentive, appropriate affect, good eye contact, fluent speech    Labs:  Lab Results   Component Value Date    WBC 3.5 03/02/2020    WBC 2.1 (L) 02/22/2020    WBC 2.3 (L) 02/21/2020    WBC 3.5 (L) 02/20/2020    WBC 2.7 (L) 02/10/2020    WBC 4.3 01/21/2020    HGB 10.6 (L) 03/02/2020    HCT 31.2 (L) 03/02/2020    Platelet 384 03/02/2020    Absolute Neutrophils 1.8 03/02/2020    Absolute Lymphocytes 0.7 03/02/2020    Absolute Eosinophils 0.1 03/02/2020    Sodium 142 03/02/2020    Potassium 4.9 03/02/2020    BUN 31 (H) 03/02/2020    Creatinine 1.76 (H) 03/02/2020    Creatinine 1.69 (H) 02/22/2020    Creatinine 1.54 (H) 02/21/2020    Creatinine 1.62 (H) 02/20/2020    Creatinine 1.47 (H) 02/10/2020    Creatinine 1.45 (H) 01/21/2020    Glucose 99 02/22/2020    Magnesium 1.6 03/02/2020    Albumin 2.9 (L) 02/22/2020    Total Bilirubin 0.6 02/22/2020    AST 20 02/22/2020    ALT 22 02/22/2020    Alkaline Phosphatase 50 02/22/2020    INR 1.08 02/17/2020    CRP 228.0 (H) 02/18/2020    CRP 105.0 (H) 02/18/2020   wbc ok  Cr mildly up  ALT ok    Microbiology:  No new positive cultures    Lab Results   Component Value Date    CMV Viral Ld Not Detected 08/06/2019    CMV Viral Ld Not Detected 07/13/2019    CMV Viral Ld Not Detected 06/01/2019    CMV Quant Undetected 02/21/2020    CMV Quant Negative 01/21/2020    CMV Quant Negative 01/21/2020    CMV Quant Negative 01/14/2020     Imaging:  None new since d/c  1/7 allograft [place] reviewed and unremarkable

## 2020-03-11 LAB — BASIC METABOLIC PANEL
BLOOD UREA NITROGEN: 22 mg/dL — ABNORMAL HIGH (ref 6–20)
BUN / CREAT RATIO: 13 (ref 9–23)
CALCIUM: 9.6 mg/dL (ref 8.7–10.2)
CHLORIDE: 108 mmol/L — ABNORMAL HIGH (ref 96–106)
CO2: 19 mmol/L — ABNORMAL LOW (ref 20–29)
CREATININE: 1.7 mg/dL — ABNORMAL HIGH (ref 0.57–1.00)
GLUCOSE: 84 mg/dL (ref 65–99)
POTASSIUM: 4.4 mmol/L (ref 3.5–5.2)
SODIUM: 146 mmol/L — ABNORMAL HIGH (ref 134–144)

## 2020-03-11 LAB — CBC W/ DIFFERENTIAL
BANDED NEUTROPHILS ABSOLUTE COUNT: 0 10*3/uL (ref 0.0–0.1)
BASOPHILS ABSOLUTE COUNT: 0 10*3/uL (ref 0.0–0.2)
BASOPHILS RELATIVE PERCENT: 1 %
EOSINOPHILS ABSOLUTE COUNT: 0.2 10*3/uL (ref 0.0–0.4)
EOSINOPHILS RELATIVE PERCENT: 5 %
HEMATOCRIT: 32.4 % — ABNORMAL LOW (ref 34.0–46.6)
HEMOGLOBIN: 10.6 g/dL — ABNORMAL LOW (ref 11.1–15.9)
IMMATURE GRANULOCYTES: 0 %
LYMPHOCYTES ABSOLUTE COUNT: 0.8 10*3/uL (ref 0.7–3.1)
LYMPHOCYTES RELATIVE PERCENT: 17 %
MEAN CORPUSCULAR HEMOGLOBIN CONC: 32.7 g/dL (ref 31.5–35.7)
MEAN CORPUSCULAR HEMOGLOBIN: 30.8 pg (ref 26.6–33.0)
MEAN CORPUSCULAR VOLUME: 94 fL (ref 79–97)
MONOCYTES ABSOLUTE COUNT: 0.5 10*3/uL (ref 0.1–0.9)
MONOCYTES RELATIVE PERCENT: 12 %
NEUTROPHILS ABSOLUTE COUNT: 2.9 10*3/uL (ref 1.4–7.0)
NEUTROPHILS RELATIVE PERCENT: 65 %
PLATELET COUNT: 262 10*3/uL (ref 150–450)
RED BLOOD CELL COUNT: 3.44 x10E6/uL — ABNORMAL LOW (ref 3.77–5.28)
RED CELL DISTRIBUTION WIDTH: 15.2 % (ref 11.7–15.4)
WHITE BLOOD CELL COUNT: 4.4 10*3/uL (ref 3.4–10.8)

## 2020-03-11 LAB — PHOSPHORUS: PHOSPHORUS, SERUM: 3.8 mg/dL (ref 3.0–4.3)

## 2020-03-11 LAB — MAGNESIUM: MAGNESIUM: 1.5 mg/dL — ABNORMAL LOW (ref 1.6–2.3)

## 2020-03-13 DIAGNOSIS — Z940 Kidney transplant status: Principal | ICD-10-CM

## 2020-03-13 DIAGNOSIS — Z9483 Pancreas transplant status: Principal | ICD-10-CM

## 2020-03-13 DIAGNOSIS — E0929 Drug or chemical induced diabetes mellitus with other diabetic kidney complication: Principal | ICD-10-CM

## 2020-03-13 MED ORDER — TACROLIMUS XR 4 MG TABLET,EXTENDED RELEASE 24 HR
ORAL_TABLET | Freq: Every day | ORAL | 3 refills | 108 days | Status: CP
Start: 2020-03-13 — End: ?
  Filled 2020-03-13: qty 30, 30d supply, fill #0

## 2020-03-13 MED ORDER — TACROLIMUS XR 1 MG TABLET,EXTENDED RELEASE 24 HR
ORAL_TABLET | Freq: Every day | ORAL | 11 refills | 30.00000 days | Status: CP
Start: 2020-03-13 — End: 2020-03-27

## 2020-03-13 MED FILL — ENVARSUS XR 4 MG TABLET,EXTENDED RELEASE: ORAL | 30 days supply | Qty: 150 | Fill #0

## 2020-03-14 LAB — TACROLIMUS LEVEL: TACROLIMUS BLOOD: 7.7 ng/mL (ref 2.0–20.0)

## 2020-03-16 NOTE — Unmapped (Signed)
I talked to this patient about going straight to another fecal microbiota transplantation vs. Watch and wait after she takes the fidaxomicin course; given her lack of severe symptoms in the past and I think a reasonable chance that the fidaxomicin may work following a first (possible, not tested) recurrence of C Diff after FMT, the current plan is to watch and wait following finishing her fidaxomicin course.    She will let me know if the fidaxomicin is actually unavailable to her because of cost.    Aleatha Borer, MD MSc  Associate Professor, Gastroenterology and Hepatology  [facility] [place] [facility]

## 2020-03-18 LAB — CBC W/ DIFFERENTIAL
BANDED NEUTROPHILS ABSOLUTE COUNT: 0 10*3/uL (ref 0.0–0.1)
BASOPHILS ABSOLUTE COUNT: 0 10*3/uL (ref 0.0–0.2)
BASOPHILS RELATIVE PERCENT: 1 %
EOSINOPHILS ABSOLUTE COUNT: 0.6 10*3/uL — ABNORMAL HIGH (ref 0.0–0.4)
EOSINOPHILS RELATIVE PERCENT: 11 %
HEMATOCRIT: 31.2 % — ABNORMAL LOW (ref 34.0–46.6)
HEMOGLOBIN: 10.4 g/dL — ABNORMAL LOW (ref 11.1–15.9)
IMMATURE GRANULOCYTES: 0 %
LYMPHOCYTES ABSOLUTE COUNT: 0.6 10*3/uL — ABNORMAL LOW (ref 0.7–3.1)
LYMPHOCYTES RELATIVE PERCENT: 10 %
MEAN CORPUSCULAR HEMOGLOBIN CONC: 33.3 g/dL (ref 31.5–35.7)
MEAN CORPUSCULAR HEMOGLOBIN: 31 pg (ref 26.6–33.0)
MEAN CORPUSCULAR VOLUME: 93 fL (ref 79–97)
MONOCYTES ABSOLUTE COUNT: 0.5 10*3/uL (ref 0.1–0.9)
MONOCYTES RELATIVE PERCENT: 9 %
NEUTROPHILS ABSOLUTE COUNT: 4 10*3/uL (ref 1.4–7.0)
NEUTROPHILS RELATIVE PERCENT: 69 %
PLATELET COUNT: 222 10*3/uL (ref 150–450)
RED BLOOD CELL COUNT: 3.36 x10E6/uL — ABNORMAL LOW (ref 3.77–5.28)
RED CELL DISTRIBUTION WIDTH: 14.5 % (ref 11.7–15.4)
WHITE BLOOD CELL COUNT: 5.8 10*3/uL (ref 3.4–10.8)

## 2020-03-18 LAB — MAGNESIUM: MAGNESIUM: 1.6 mg/dL (ref 1.6–2.3)

## 2020-03-18 LAB — RENAL FUNCTION PANEL
ALBUMIN: 4 g/dL (ref 3.8–4.8)
BLOOD UREA NITROGEN: 27 mg/dL — ABNORMAL HIGH (ref 6–20)
BUN / CREAT RATIO: 14 (ref 9–23)
CALCIUM: 9.4 mg/dL (ref 8.7–10.2)
CHLORIDE: 111 mmol/L — ABNORMAL HIGH (ref 96–106)
CO2: 19 mmol/L — ABNORMAL LOW (ref 20–29)
CREATININE: 1.96 mg/dL — ABNORMAL HIGH (ref 0.57–1.00)
GFR MDRD AF AMER: 38 mL/min/{1.73_m2} — ABNORMAL LOW
GFR MDRD NON AF AMER: 33 mL/min/{1.73_m2} — ABNORMAL LOW
GLUCOSE: 94 mg/dL (ref 65–99)
PHOSPHORUS, SERUM: 4.2 mg/dL (ref 3.0–4.3)
POTASSIUM: 4.6 mmol/L (ref 3.5–5.2)
SODIUM: 144 mmol/L (ref 134–144)

## 2020-03-18 LAB — PHOSPHORUS: PHOSPHORUS, SERUM: 4.4 mg/dL — ABNORMAL HIGH (ref 3.0–4.3)

## 2020-03-18 LAB — LIPASE: LIPASE: 25 U/L (ref 14–72)

## 2020-03-20 DIAGNOSIS — D849 Immunodeficiency, unspecified: Principal | ICD-10-CM

## 2020-03-20 DIAGNOSIS — Z940 Kidney transplant status: Principal | ICD-10-CM

## 2020-03-20 LAB — CMV DNA, QUANTITATIVE, PCR: CMV QUANT: NEGATIVE [IU]/mL

## 2020-03-20 NOTE — Unmapped (Signed)
Cr 1.96 reviewed with Dr Carlene Coria. Patient report repeated C-DIFF diarrhea since fecal transplant. ID restarted her on Dificid antibiotic.  Advised to hydrate and repeat in a week.

## 2020-03-21 ENCOUNTER — Ambulatory Visit: Admit: 2020-03-21 | Discharge: 2020-03-22 | Payer: MEDICARE

## 2020-03-21 DIAGNOSIS — Z940 Kidney transplant status: Principal | ICD-10-CM

## 2020-03-21 DIAGNOSIS — A498 Other bacterial infections of unspecified site: Principal | ICD-10-CM

## 2020-03-21 LAB — BASIC METABOLIC PANEL
ANION GAP: 5 mmol/L (ref 5–14)
BLOOD UREA NITROGEN: 17 mg/dL (ref 9–23)
BUN / CREAT RATIO: 12
CALCIUM: 9.2 mg/dL (ref 8.7–10.4)
CHLORIDE: 113 mmol/L — ABNORMAL HIGH (ref 98–107)
CO2: 24 mmol/L (ref 20.0–31.0)
CREATININE: 1.39 mg/dL — ABNORMAL HIGH
EGFR CKD-EPI AA FEMALE: 58 mL/min/{1.73_m2} — ABNORMAL LOW (ref >=60)
EGFR CKD-EPI NON-AA FEMALE: 50 mL/min/{1.73_m2} — ABNORMAL LOW (ref >=60)
GLUCOSE RANDOM: 88 mg/dL (ref 70–179)
POTASSIUM: 4.2 mmol/L (ref 3.4–4.5)
SODIUM: 142 mmol/L (ref 135–145)

## 2020-03-21 LAB — TACROLIMUS LEVEL: TACROLIMUS BLOOD: 5.6 ng/mL (ref 2.0–20.0)

## 2020-03-21 MED ADMIN — sodium chloride (NS) 0.9 % infusion 2,000 mL 2,000 mL: 2000 mL | INTRAVENOUS | @ 19:00:00 | Stop: 2020-03-21

## 2020-03-21 NOTE — Unmapped (Signed)
1330 Patient arrived to the Transplant Infusion Room today for NS hydration, Condition: well; Mobility: ambulating; accompanied by self.   See Flowsheet and MAR for all details of visit.   1334 VS stable.  1340 PIV placed and secured with coban.  1342 Infusion initiated.  1542 Infusion complete.  1545 BMP sent to lab post infusion.  1550 VS stable, PIV removed and secured with coban, patient left clinic today, Condition: well; Mobility: ambulating; accompanied by self.

## 2020-03-22 DIAGNOSIS — Z940 Kidney transplant status: Principal | ICD-10-CM

## 2020-03-23 ENCOUNTER — Ambulatory Visit: Admit: 2020-03-23 | Discharge: 2020-03-23 | Payer: MEDICARE | Attending: Nephrology | Primary: Nephrology

## 2020-03-23 ENCOUNTER — Ambulatory Visit: Admit: 2020-03-23 | Discharge: 2020-03-23 | Payer: MEDICARE

## 2020-03-23 DIAGNOSIS — Z79899 Other long term (current) drug therapy: Principal | ICD-10-CM

## 2020-03-23 DIAGNOSIS — U071 COVID-19: Principal | ICD-10-CM

## 2020-03-23 DIAGNOSIS — Z940 Kidney transplant status: Principal | ICD-10-CM

## 2020-03-23 DIAGNOSIS — E10319 Type 1 diabetes mellitus with unspecified diabetic retinopathy without macular edema: Principal | ICD-10-CM

## 2020-03-23 DIAGNOSIS — Z7982 Long term (current) use of aspirin: Principal | ICD-10-CM

## 2020-03-23 DIAGNOSIS — Z23 Encounter for immunization: Principal | ICD-10-CM

## 2020-03-23 DIAGNOSIS — Z9483 Pancreas transplant status: Principal | ICD-10-CM

## 2020-03-23 LAB — URINALYSIS
BILIRUBIN UA: NEGATIVE
BLOOD UA: NEGATIVE
GLUCOSE UA: NEGATIVE
KETONES UA: NEGATIVE
LEUKOCYTE ESTERASE UA: NEGATIVE
NITRITE UA: NEGATIVE
PH UA: 6 (ref 5.0–9.0)
PROTEIN UA: NEGATIVE
RBC UA: <1 /HPF (ref <4)
SPECIFIC GRAVITY UA: 1.015 (ref 1.005–1.030)
SQUAMOUS EPITHELIAL: 1 /HPF (ref 0–5)
UROBILINOGEN UA: 0.2
WBC UA: <1 /HPF (ref 0–5)

## 2020-03-23 LAB — COMPREHENSIVE METABOLIC PANEL
ALBUMIN: 3.6 g/dL (ref 3.4–5.0)
ALKALINE PHOSPHATASE: 66 U/L (ref 46–116)
ALT (SGPT): 35 U/L (ref 10–49)
ANION GAP: 3 mmol/L — ABNORMAL LOW (ref 5–14)
AST (SGOT): 23 U/L (ref <=34)
BILIRUBIN TOTAL: 1 mg/dL (ref 0.3–1.2)
BLOOD UREA NITROGEN: 19 mg/dL (ref 9–23)
BUN / CREAT RATIO: 13
CALCIUM: 10 mg/dL (ref 8.7–10.4)
CHLORIDE: 111 mmol/L — ABNORMAL HIGH (ref 98–107)
CO2: 27.7 mmol/L (ref 20.0–31.0)
CREATININE: 1.52 mg/dL — ABNORMAL HIGH
EGFR CKD-EPI AA FEMALE: 52 mL/min/{1.73_m2} — ABNORMAL LOW (ref >=60)
EGFR CKD-EPI NON-AA FEMALE: 45 mL/min/{1.73_m2} — ABNORMAL LOW (ref >=60)
GLUCOSE RANDOM: 92 mg/dL (ref 70–99)
POTASSIUM: 4.3 mmol/L (ref 3.4–4.5)
PROTEIN TOTAL: 7.1 g/dL (ref 5.7–8.2)
SODIUM: 142 mmol/L (ref 135–145)

## 2020-03-23 LAB — PROTEIN / CREATININE RATIO, URINE
CREATININE, URINE: 123.5 mg/dL
PROTEIN URINE: 18 mg/dL
PROTEIN/CREAT RATIO, URINE: 0.146

## 2020-03-23 LAB — CBC W/ AUTO DIFF
BASOPHILS ABSOLUTE COUNT: 0.1 10*9/L (ref 0.0–0.1)
BASOPHILS RELATIVE PERCENT: 1 %
EOSINOPHILS ABSOLUTE COUNT: 0.3 10*9/L (ref 0.0–0.7)
EOSINOPHILS RELATIVE PERCENT: 6.5 %
HEMATOCRIT: 31.5 % — ABNORMAL LOW (ref 35.0–44.0)
HEMOGLOBIN: 10.5 g/dL — ABNORMAL LOW (ref 12.0–15.5)
LYMPHOCYTES ABSOLUTE COUNT: 0.6 10*9/L — ABNORMAL LOW (ref 0.7–4.0)
LYMPHOCYTES RELATIVE PERCENT: 11.1 %
MEAN CORPUSCULAR HEMOGLOBIN CONC: 33.5 g/dL (ref 30.0–36.0)
MEAN CORPUSCULAR HEMOGLOBIN: 31.5 pg (ref 26.0–34.0)
MEAN CORPUSCULAR VOLUME: 93.9 fL (ref 82.0–98.0)
MEAN PLATELET VOLUME: 7.8 fL (ref 7.0–10.0)
MONOCYTES ABSOLUTE COUNT: 0.4 10*9/L (ref 0.1–1.0)
MONOCYTES RELATIVE PERCENT: 7.7 %
NEUTROPHILS ABSOLUTE COUNT: 3.9 10*9/L (ref 1.7–7.7)
NEUTROPHILS RELATIVE PERCENT: 73.7 %
PLATELET COUNT: 214 10*9/L (ref 150–450)
RED BLOOD CELL COUNT: 3.35 10*12/L — ABNORMAL LOW (ref 3.90–5.03)
RED CELL DISTRIBUTION WIDTH: 14.2 % (ref 12.0–15.0)
WBC ADJUSTED: 5.3 10*9/L (ref 3.5–10.5)

## 2020-03-23 LAB — MAGNESIUM: MAGNESIUM: 1.4 mg/dL — ABNORMAL LOW (ref 1.6–2.6)

## 2020-03-23 LAB — AMYLASE: AMYLASE: 142 U/L — ABNORMAL HIGH (ref 30–118)

## 2020-03-23 LAB — TACROLIMUS LEVEL, TROUGH: TACROLIMUS, TROUGH: 6.7 ng/mL (ref 5.0–15.0)

## 2020-03-23 LAB — PHOSPHORUS: PHOSPHORUS: 2.6 mg/dL (ref 2.4–5.1)

## 2020-03-23 LAB — LIPASE: LIPASE: 27 U/L (ref 12–53)

## 2020-03-23 MED ORDER — AMLODIPINE 5 MG TABLET
ORAL_TABLET | Freq: Every day | ORAL | 3 refills | 90 days | Status: CP
Start: 2020-03-23 — End: 2021-03-23
  Filled 2020-03-23: qty 90, 90d supply, fill #0

## 2020-03-23 NOTE — Unmapped (Signed)
Transplant Nephrology Clinic Visit      History of Present Illness    Elaine Koch is a 35 y.o. 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 She does not have evidence of donor specific antibodies as of 07/15/2019. Her most recent baseline creatinine is between 1.3-1.5, occasionally up to 1.7.    She was on hemodialysis for about 3 years prior to transplant through an AVF. Her diabetes has been complicated by retinopathy. She had pre-eclampsia during pregnancy in 2017. She also had severe anemia requiring multiple blood transfusions during pregnancy and for some time after. In spite of that she had a cPRA of 0% prior to transplant.    She was admitted from 3/21 through 05/11/19 for the initial transplant. Her course was fairly uncomplicated. On POD 8 did have a transient bump in her creatinine (1.39 to 1.57) and amylase (76 to 112), came down to 1.52 and 89 with fluids. [place] of both kidney and pancreas showed patent vessels and normal RIs. After discharge, she had some sugars in the 120s. On 4/2 reported BP 88/58 so amlodipine was held. At surgery visit 4/8 reported loose stools, which she had pre-transplant. Reglan was decreased. BP 80s/40s so all antihypertensives were held. At visit 4/15 pressures remained low so midodrine 5 mg daily was started, reglan further decreased. Her amylase was 467 at that visit so prednisone 5 mg was started since tacrolimus was sub-therapeutic, with plans to taper off once tac stable and pancreatic enzymes normal. At nephrology visit 4/20, amylase down to 179 and tacrolimus level better on TID dosing. Ureteral stent removed 06/10/19. At visit 5/25 planned transition to Envarsus given high tacrolimus requirement. Amylase remained elevated but stable at 149. Blood pressure improved and was no longer hypotensive.     She called the on-call coordinator on 07/11/19 complaining of headache, nausea and vomiting, diarrhea the night prior. No fever. Was able to keep morning meds down. Recommended bland diet, push fluids. On 6/1 called to report BP 143/110 with chest pain, headache and vomiting and was referred to ED. She was admitted from 6/1 through 07/16/19. On admission she was noted to have a creatinine of 3.22 (from 1.19 on 5/28) in the setting of a tacrolimus level of 27.9. Tacrolimus was held and level came down. She was hypotensive and given IVF. She was found to be C.diff positive and started on vancomycin with plans for 10 day course. Creatinine to 1.32 on discharge. Wbc 0.8 on discharge with ANC 0.5. CMV negative on 6/1. Was discharged on Myfortic 720 mg BID.    On 07/20/19 she was noted to have a wbc < 0.6 and ANC < 0.2. She reported no symptoms and was instructed to hold Myfortic and Valcyte on 6/10 and received a dose of Granix. On 6/10 wbc to 0.6 with ANC 0.3. On 6/16 she reported diarrhea returned after stopping vancomycin, course was extended for an additional 14 days. Wbc improved to 3.9 on 6/14 and then 6.1 on 6/17 so Myfortic restarted at 180 mg BID. Creatinine to 1.56 on 6/17 and 1.49 on 6/21, reported had not been drinking as much since back at work. Wbc 8.6 on 6/21 with ANC 7.5. Most recent lipase has been 90-122.    She followed up with ID on 08/31/19, who placed a referral to Dr. Fara Boros with GI for possible FMT. Patient finished fidaxomicin on 09/06/19 and restarted Vancomycin. When this occurred, patient's diarrhea returned. Patient's vancomycin stopped and she  restarted Fidaxomicin on 09/13/19. She completed her Bezlotoxumab on 09/17/19.     She was admitted from 8/20 through 10/03/19 after presenting with LLQ pain, myalgias, headaches, and low grade fevers. Urine grew 50-100K Enterococcus, no other source found, CT abd/pelvis, TTE and renal US all unremarkable. Treated with 10 day course of linezolid.    She had a virtual visit with ID on 11/30/19 for diarrhea.  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 Per note She can continue the Vanco 125mg  4x daily until she is able to start fidaxomicin 200mg  bid for 10 days then decrease to 200mg  daily for secondary ppx.  Patient saw GI on 12/10/19 with a plan for fecal transplant 02/14/2020.  She followed up with ID on 12/24/19 who stated to Decrease Dificid tp 200mg  once daily for secondary ppx until Dec 31 (three days prior to FMT) and to Decrease Valcyte 900mg  qhs for secondary ppx until follow up in early Feb.     She underwent fecal transplant on 02/14/20. On 02/17/20 she called her coordinator and reported feeling poorly since the procedure, with cramping in her lower back, N/V, dysuria and frequency. Denied fever. She had been given cipro by her OB/GYN which we asked her not to take given recent fecal transplant, referred her to ED. She was admitted from 1/6 through 02/22/20. She had creatinine up to 2.34, down to 1.69 on discharge with IVF. Her tacrolimus level was up to 13.5 on 02/21/20, so tac dose was reduced. She grew proteus in her urine and blood, treated with IV cefipime and then transitioned to oral cipro through 02/28/20. She was also found to be COVID positive on admission and received remdesivir. Myfortic and Valcyte were held on discharge due to leukopenia, CMV was not detected.    Interval since last visit 03/01/20    Myfortic restarted at 180 mg BID on 03/03/20.     She had a telemedicine visit with [facility] on 03/10/20, patient reported recurrence of diarrhea similar to previous C.diff on 03/06/20. Restarted vancomycin BID which helped but did not resolve. Recommended increasing vanc to QID until she could get fidaxomicin filled, planned for 10 day course followed by vanc 1-2 times per day for maintenance. Also recommended 1g D-mannose after intercourse and 12 hours later to see if it helps with UTIs. Anticipated that she will require repeat FMT.    She presents today for follow up.    She has not yet picked up the fidaxomicin. Continues to have diarrhea. Her appetite is decreased, notes bloating with Gas-X.    She specifically denies fever, myalgias, upper respiratory/GI symptoms, or change in taste/smell.      Review of Systems    Otherwise on review of systems patient denies fever or chills, chest pain, SOB, PND or orthopnea, lower extremity edema. Denies N/V/abdominal pain. No dysuria, hematuria or difficulty voiding. Bowel movements without blood. Denies joint pain or rash. All other systems are reviewed and are negative.    Medications    Current Outpatient Medications   Medication Sig Dispense Refill   ??? aspirin (ECOTRIN) 81 MG tablet Take 1 tablet (81 mg total) by mouth daily. 30 tablet 11   ??? cholecalciferol, vitamin D3-125 mcg, 5,000 unit,, 125 mcg (5,000 unit) capsule Take 1 capsule (125 mcg total) by mouth daily. 100 capsule 10   ??? predniSONE (DELTASONE) 5 MG tablet Take 1 tablet (5 mg total) by mouth daily. 30 tablet 11   ??? sodium bicarbonate 650 mg tablet  Take 2 tablets (1,300 mg total) by mouth Three (3) times a day. 180 tablet 3   ??? amLODIPine (NORVASC) 5 MG tablet Take 1 tablet (5 mg total) by mouth daily. 90 tablet 3   ??? fidaxomicin (DIFICID) 200 mg tablet Take 1 tablet (200 mg total) by mouth every twelve (12) hours. 60 tablet 3   ??? mycophenolate (MYFORTIC) 180 MG EC tablet Take 1 tablet (180 mg total) by mouth Two (2) times a day. 60 tablet 11   ??? tacrolimus (ENVARSUS XR) 1 mg Tb24 extended release tablet Take 2 (1 mg) tablets by mouth daily with 4 (4mg ) tablets for total daily dose of 18mg  60 tablet 5   ??? tacrolimus (ENVARSUS XR) 4 mg Tb24 extended release tablet Take 4 (4 mg) tablets by mouth daily with 2 (1mg ) tablets for total daily dose of 18mg . 120 tablet 11     No current facility-administered medications for this visit.       Physical Exam    BP 152/82 (BP Site: L Arm, BP Position: Sitting, BP Cuff Size: Large)  - Pulse 86  - Temp 35.9 ??C (96.6 ??F) (Temporal)  - Ht 165.1 cm (5' 5)  - Wt 79.7 kg (175 lb 12.8 oz)  - BMI 29.25 kg/m?? General: Patient is a pleasant female in no apparent distress.  Eyes: Sclera anicteric.  ENT: Mask in place.  Neck: Supple without LAD/JVD/bruits.  Lungs: Clear to auscultation bilaterally, no wheezes/rales/rhonchi.  Cardiovascular: Regular rate and rhythm without murmurs, rubs or gallops.  Abdomen: Soft, notender/nondistended. Positive bowel sounds. No tenderness over the graft.  Extremities: Without edema, joints without evidence of synovitis.  Skin: Without rash.  Neurological: Grossly nonfocal.  Psychiatric: Mood and affect appropriate.    Laboratory Results    Results for orders placed or performed in visit on 03/23/20   Urine Culture    Specimen: Clean Catch; Urine   Result Value Ref Range    Urine Culture, Comprehensive Mixed Urogenital Flora    Protein/Creatinine Ratio, Urine   Result Value Ref Range    Creat U 123.5 Undefined mg/dL    Protein, Ur 16.1 mg/dL    Protein/Creatinine Ratio, Urine 0.146 Undefined   Urinalysis   Result Value Ref Range    Color, UA Yellow     Clarity, UA Clear     Specific Gravity, UA 1.015 1.005 - 1.030    pH, UA 6.0 5.0 - 9.0    Leukocyte Esterase, UA Negative Negative    Nitrite, UA Negative Negative    Protein, UA Negative Negative    Glucose, UA Negative Negative    Ketones, UA Negative Negative    Urobilinogen, UA 0.2 mg/dL 0.2 - 2.0 mg/dL    Bilirubin, UA Negative Negative    Blood, UA Negative Negative    RBC, UA <1 <4 /HPF    WBC, UA <1 0 - 5 /HPF    Squam Epithel, UA 1 0 - 5 /HPF    Bacteria, UA Rare (A) None Seen /HPF   Cytology - Urine   Result Value Ref Range    Final Diagnosis       Urine for decoy cells:  -Negative for high-grade urothelial carcinoma  -No decoy cells identified    This electronic signature is attestation that the pathologist personally reviewed the submitted material(s) and the final diagnosis reflects that evaluation.      Clinical History       Status post kidney transplant urine for decoy cells  Gross Description            Urine clean catch voided decoy    20mL cloudy, yellow fluid, unfixed    Materials Prepared & Examined    Smear Slides.......0  Monolayers..........1  Cytospins.............0  Cell Blocks...........0  Core Biopsy.........0  Touch Prep..........0          Microscopic Description       Microscopic examination substantiates the above diagnosis.    Resident Physician: None Assigned      EMBEDDED IMAGES      Specimen Adequacy Satisfactory for evaluation     Disclaimer       Unless otherwise specified, specimens are preserved using 10% neutral buffered formalin. For cases in which immunohistochemical and/or in-situ hybridization stains are performed, the following statement applies: Appropriate controls for each stain (positive controls with or without negative controls) have been evaluated and stain as expected. These stains have not been separately validated for use on decalcified specimens and should be interpreted with caution in that setting. Some of the reagents used for these stains may be classified as analyte specific reagents (ASR). Tests using ASRs were developed, and their performance characteristics were determined, by the Anatomic Pathology Department [facility]). They have not been cleared or approved by the [place] Food and Drug Administration (FDA). The FDA does not require these tests to go through premarket FDA review. These tests are used for clinical purposes. They should not be regarded as investigational or for research. This laboratory is certified under the Clinical Laboratory Improvement Amendments (CLIA) as qualified to perform high complexity clinical laboratory testing.         Assessment and Plan    1. Status post renal transplant. Her creatinine is 1.52 and within her most recent baseline, has been elevated recently in the setting of elevated tacrolimus levels. Envarsus trough 6.7, and is a 24 hour trough. Below goal of 8-10 (due to pancreas transplant), but not unreasonable given infections complications. Will continue current immunosuppression for now.    2. Status post pancreas transplant. She has had a mildly elevated amylase since transplant, 142 today with normal lipase. Peaked at 467 on 05/27/19. Hgb A1c reassuring at 4.3% on 10/13/19.    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 On reduced Myfortic at 180 mg BID.     4. 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 Suspect due to decreased immunosuppression in the setting of infection, not present on DSA screen today. Will continue to follow.    5. History of hypertension. Not currently on blood pressure medications, BP has been elevated lately. 152/82 in clinic today. Will add amlodipine 5 mg daily.    6. C.diff. Diarrhea unfortunately has recurred following fecal transplant. She is followed by [facility]. Has not yet picked up the fidaxomicin, have asked her to do so.    7. Childbearing age. She is aware that Myfortic is teratogenic. Unable to get IUD locally. Has been referred to [facility] for IUD.     8. Health maintenance. She received a flu shot 11/26/19. She has had a Pnemovax vaccine, needs Prevnar 13. Received her third dose of the Pfizer Covid vaccine today in clinic 03/23/20     We discussed that while we continue to recommend the COVID vaccine for all eligible patients, we know that kidney transplant patients do not respond as strongly due to their immunosuppression, so she should continue to follow the CDC guidelines  for patients who are not vaccinated. I would also encourage all household members and close contacts that are eligible for vaccination to get vaccinated. We discussed the importance of ongoing social distancing, wearing a mask outside the home, and hand washing/sanitizing.     9. Will see patient back in 8 weeks, or sooner if needed.

## 2020-03-23 NOTE — Unmapped (Signed)
AOBP left :arm  Large   cuff   Average:145 85  Pulse: 87  1st reading:  152 82 Pulse:86  2nd reading   140 82 :Pulse: 88  3rd reading : 144 90  Pulse: 87

## 2020-03-27 DIAGNOSIS — Z940 Kidney transplant status: Principal | ICD-10-CM

## 2020-03-27 MED ORDER — MYCOPHENOLATE SODIUM 180 MG TABLET,DELAYED RELEASE
ORAL_TABLET | Freq: Two times a day (BID) | ORAL | 11 refills | 30.00000 days | Status: CN
Start: 2020-03-27 — End: 2021-03-27
  Filled 2020-03-27: qty 60, 30d supply, fill #0

## 2020-03-27 MED ORDER — ENVARSUS XR 1 MG TABLET,EXTENDED RELEASE
ORAL_TABLET | Freq: Every day | ORAL | 11 refills | 30.00000 days | Status: CN
Start: 2020-03-27 — End: ?

## 2020-03-27 MED ORDER — TACROLIMUS XR 1 MG TABLET,EXTENDED RELEASE 24 HR
ORAL_TABLET | Freq: Every day | ORAL | 11 refills | 30 days | Status: CP
Start: 2020-03-27 — End: ?
  Filled 2020-03-27: qty 30, 30d supply, fill #0

## 2020-03-27 MED FILL — ASPIRIN 81 MG TABLET,DELAYED RELEASE: ORAL | 60 days supply | Qty: 60 | Fill #3

## 2020-03-27 NOTE — Unmapped (Addendum)
[facility] Clinical Assessment & Refill Coordination Note    Elaine Koch, [name]: 10-Sep-1985  Phone: [phone] (home)     All above HIPAA information was verified with patient.     Was a Nurse, learning disability used for this call? No    Specialty Medication(s):   Transplant: Envarsus 1mg , Envarsus 4mg  and  mycophenolic acid 180mg      Current Outpatient Medications   Medication Sig Dispense Refill   ??? amLODIPine (NORVASC) 5 MG tablet Take 1 tablet (5 mg total) by mouth daily. 90 tablet 3   ??? aspirin (ECOTRIN) 81 MG tablet Take 1 tablet (81 mg total) by mouth daily. 30 tablet 11   ??? biotin 5 mg tablet Take 1 tablet (5 mg total) by mouth daily. (Patient not taking: Reported on 03/23/2020) 30 tablet 11   ??? cholecalciferol, vitamin D3-125 mcg, 5,000 unit,, 125 mcg (5,000 unit) capsule Take 1 capsule (125 mcg total) by mouth daily. 100 capsule 10   ??? fidaxomicin (DIFICID) 200 mg tablet Take 1 tablet (200 mg total) by mouth every twelve (12) hours. (Patient not taking: Reported on 03/23/2020) 20 tablet 0   ??? multivitamin, prenatal, folic acid-iron, 27-1 mg Tab Take 1 tablet by mouth daily. (Patient not taking: Reported on 03/23/2020)     ??? mycophenolate (MYFORTIC) 180 MG EC tablet Take 1 tablet (180 mg total) by mouth Two (2) times a day. 60 tablet 11   ??? predniSONE (DELTASONE) 5 MG tablet Take 1 tablet (5 mg total) by mouth daily. 30 tablet 11   ??? sodium bicarbonate 650 mg tablet Take 2 tablets (1,300 mg total) by mouth Three (3) times a day. 180 tablet 3   ??? tacrolimus (ENVARSUS XR) 1 mg Tb24 extended release tablet Take 1 tablet (1 mg total) by mouth daily with 5 (4 mg) tablets for daily dose of 21 mg total 30 tablet 11   ??? tacrolimus (ENVARSUS XR) 4 mg Tb24 extended release tablet Take 5 tablets (20 mg total) by mouth daily with 1 (1 mg) tablet for daily dose of 21 mg total (Patient taking differently: Take 18 mg by mouth daily. ) 540 tablet 3     No current facility-administered medications for this visit.        Changes to medications: Elaine Koch reports no changes at this time.    Allergies   Allergen Reactions   ??? Iodinated Contrast Media Swelling, Rash and Other (See Comments)     Burning, warmth throughout body and tingling.  Throat swelling.  Treated with benadryl and symptoms improved.  Has not had contrast since then (2013 or 2014).   ??? Nickel Rash   ??? Propranolol Swelling   ??? Eye Irrigating Solution [Ophthalmic Irrigation Solution] Other (See Comments)     Contrast dye (name?) used in eyes caused hot feeling in face, reversed with Benadryl.    ??? Iodine      Other reaction(s): Skin Rash   ??? Naltrexone Rash   ??? Uni-Cortrom Rash       Changes to allergies: No    SPECIALTY MEDICATION ADHERENCE     Envarsus Xr 4 mg: 14 days of medicine on hand   Envarsus Xr 1 mg: 5 days of medicine on hand   Mycophenolate 180 mg: 0 days of medicine on hand       Medication Adherence    Patient reported X missed doses in the last month: 0  Specialty Medication: Envarsus Xr 4mg   Patient is on additional  specialty medications: Yes  Additional Specialty Medications: Envarsus Xr 1mg   Patient Reported Additional Medication X Missed Doses in the Last Month: 0  Patient is on more than two specialty medications: Yes  Specialty Medication: Mycophenolate 180mg   Patient Reported Additional Medication X Missed Doses in the Last Month: 0          Specialty medication(s) dose(s) confirmed: Patient reports changes to the regimen as follows: Envarsus 18mg  daily; Mycophenolate 360mg  BID     Are there any concerns with adherence? No    Adherence counseling provided? Not needed    CLINICAL MANAGEMENT AND INTERVENTION      Clinical Benefit Assessment:    Do you feel the medicine is effective or helping your condition? Yes    Clinical Benefit counseling provided? Not needed    Adverse Effects Assessment:    Are you experiencing any side effects? No    Are you experiencing difficulty administering your medicine? No    Quality of Life Assessment:    How many days over the past month did your kidney/pancreas transplant  keep you from your normal activities? For example, brushing your teeth or getting up in the morning. 2    Have you discussed this with your provider? Not needed    Therapy Appropriateness:    Is therapy appropriate? Yes, therapy is appropriate and should be continued    DISEASE/MEDICATION-SPECIFIC INFORMATION      N/A    PATIENT SPECIFIC NEEDS     - Does the patient have any physical, cognitive, or cultural barriers? No    - Is the patient high risk? Yes, patient is taking a REMS drug. Medication is dispensed in compliance with REMS program    - Does the patient require a Care Management Plan? No     - Does the patient require physician intervention or other additional services (i.e. nutrition, smoking cessation, social work)? No      SHIPPING     Specialty Medication(s) to be Shipped:   Transplant: Envarsus 1mg  and  mycophenolic acid 180mg     Other medication(s) to be shipped: asa     Changes to insurance: No    Delivery Scheduled: Yes, Expected medication delivery date: 03/28/20.     Medication will be delivered via UPS to the confirmed prescription address in [facility].    The patient will receive a drug information handout for each medication shipped and additional FDA Medication Guides as required.  Verified that patient has previously received a Conservation officer, historic buildings.    All of the patient's questions and concerns have been addressed.    Tera Helper   [facility] Specialty Pharmacist

## 2020-03-30 LAB — HLA DS POST TRANSPLANT
ANTI-DONOR DRW #2 MFI: 562 MFI
ANTI-DONOR HLA-A #1 MFI: 0 MFI
ANTI-DONOR HLA-A #2 MFI: 0 MFI
ANTI-DONOR HLA-B #1 MFI: 0 MFI
ANTI-DONOR HLA-B #2 MFI: 25 MFI
ANTI-DONOR HLA-C #1 MFI: 263 MFI
ANTI-DONOR HLA-C #2 MFI: 936 MFI
ANTI-DONOR HLA-DQB #1 MFI: 0 MFI
ANTI-DONOR HLA-DQB #2 MFI: 0 MFI
ANTI-DONOR HLA-DR #1 MFI: 116 MFI
ANTI-DONOR HLA-DR #2 MFI: 50 MFI

## 2020-03-30 LAB — FSAB CLASS 1 ANTIBODY SPECIFICITY: HLA CLASS 1 ANTIBODY RESULT: POSITIVE

## 2020-03-30 LAB — FSAB CLASS 2 ANTIBODY SPECIFICITY
CLASS 2 ANTIBODIES IDENTIFIED: 1:1 {titer}
HLA CL2 AB RESULT: POSITIVE

## 2020-04-01 LAB — BASIC METABOLIC PANEL
BLOOD UREA NITROGEN: 31 mg/dL — ABNORMAL HIGH (ref 6–20)
BUN / CREAT RATIO: 19 (ref 9–23)
CALCIUM: 9.5 mg/dL (ref 8.7–10.2)
CHLORIDE: 109 mmol/L — ABNORMAL HIGH (ref 96–106)
CO2: 17 mmol/L — ABNORMAL LOW (ref 20–29)
CREATININE: 1.6 mg/dL — ABNORMAL HIGH (ref 0.57–1.00)
GLUCOSE: 90 mg/dL (ref 65–99)
POTASSIUM: 5 mmol/L (ref 3.5–5.2)
SODIUM: 141 mmol/L (ref 134–144)

## 2020-04-01 LAB — CBC W/ DIFFERENTIAL
BANDED NEUTROPHILS ABSOLUTE COUNT: 0 10*3/uL (ref 0.0–0.1)
BASOPHILS ABSOLUTE COUNT: 0 10*3/uL (ref 0.0–0.2)
BASOPHILS RELATIVE PERCENT: 1 %
EOSINOPHILS ABSOLUTE COUNT: 0.4 10*3/uL (ref 0.0–0.4)
EOSINOPHILS RELATIVE PERCENT: 6 %
HEMATOCRIT: 31.8 % — ABNORMAL LOW (ref 34.0–46.6)
HEMOGLOBIN: 10.7 g/dL — ABNORMAL LOW (ref 11.1–15.9)
IMMATURE GRANULOCYTES: 0 %
LYMPHOCYTES ABSOLUTE COUNT: 0.8 10*3/uL (ref 0.7–3.1)
LYMPHOCYTES RELATIVE PERCENT: 10 %
MEAN CORPUSCULAR HEMOGLOBIN CONC: 33.6 g/dL (ref 31.5–35.7)
MEAN CORPUSCULAR HEMOGLOBIN: 31.3 pg (ref 26.6–33.0)
MEAN CORPUSCULAR VOLUME: 93 fL (ref 79–97)
MONOCYTES ABSOLUTE COUNT: 0.5 10*3/uL (ref 0.1–0.9)
MONOCYTES RELATIVE PERCENT: 7 %
NEUTROPHILS ABSOLUTE COUNT: 6.1 10*3/uL (ref 1.4–7.0)
NEUTROPHILS RELATIVE PERCENT: 76 %
PLATELET COUNT: 274 10*3/uL (ref 150–450)
RED BLOOD CELL COUNT: 3.42 x10E6/uL — ABNORMAL LOW (ref 3.77–5.28)
RED CELL DISTRIBUTION WIDTH: 13.3 % (ref 11.7–15.4)
WHITE BLOOD CELL COUNT: 7.8 10*3/uL (ref 3.4–10.8)

## 2020-04-01 LAB — PHOSPHORUS: PHOSPHORUS, SERUM: 3.4 mg/dL (ref 3.0–4.3)

## 2020-04-01 LAB — MAGNESIUM: MAGNESIUM: 1.7 mg/dL (ref 1.6–2.3)

## 2020-04-03 DIAGNOSIS — D849 Immunodeficiency, unspecified: Principal | ICD-10-CM

## 2020-04-03 DIAGNOSIS — Z940 Kidney transplant status: Principal | ICD-10-CM

## 2020-04-04 LAB — TACROLIMUS LEVEL: TACROLIMUS BLOOD: 4.5 ng/mL (ref 2.0–20.0)

## 2020-04-05 DIAGNOSIS — Z940 Kidney transplant status: Principal | ICD-10-CM

## 2020-04-05 DIAGNOSIS — Z9483 Pancreas transplant status: Principal | ICD-10-CM

## 2020-04-05 NOTE — Unmapped (Signed)
Tacrolimus level 4.8 not a real trough. Will follow up on repeat level

## 2020-04-05 NOTE — Unmapped (Signed)
Attempted to call patient about adding radiology but no answer or vm

## 2020-04-08 LAB — MAGNESIUM: MAGNESIUM: 1.5 mg/dL — ABNORMAL LOW (ref 1.6–2.3)

## 2020-04-08 LAB — CBC W/ DIFFERENTIAL
BANDED NEUTROPHILS ABSOLUTE COUNT: 0 10*3/uL (ref 0.0–0.1)
BASOPHILS ABSOLUTE COUNT: 0.1 10*3/uL (ref 0.0–0.2)
BASOPHILS RELATIVE PERCENT: 1 %
EOSINOPHILS ABSOLUTE COUNT: 0.2 10*3/uL (ref 0.0–0.4)
EOSINOPHILS RELATIVE PERCENT: 4 %
HEMATOCRIT: 31.7 % — ABNORMAL LOW (ref 34.0–46.6)
HEMOGLOBIN: 10.5 g/dL — ABNORMAL LOW (ref 11.1–15.9)
IMMATURE GRANULOCYTES: 0 %
LYMPHOCYTES ABSOLUTE COUNT: 0.7 10*3/uL (ref 0.7–3.1)
LYMPHOCYTES RELATIVE PERCENT: 12 %
MEAN CORPUSCULAR HEMOGLOBIN CONC: 33.1 g/dL (ref 31.5–35.7)
MEAN CORPUSCULAR HEMOGLOBIN: 31.2 pg (ref 26.6–33.0)
MEAN CORPUSCULAR VOLUME: 94 fL (ref 79–97)
MONOCYTES ABSOLUTE COUNT: 0.5 10*3/uL (ref 0.1–0.9)
MONOCYTES RELATIVE PERCENT: 8 %
NEUTROPHILS ABSOLUTE COUNT: 4.7 10*3/uL (ref 1.4–7.0)
NEUTROPHILS RELATIVE PERCENT: 75 %
PLATELET COUNT: 315 10*3/uL (ref 150–450)
RED BLOOD CELL COUNT: 3.37 x10E6/uL — ABNORMAL LOW (ref 3.77–5.28)
RED CELL DISTRIBUTION WIDTH: 13 % (ref 11.7–15.4)
WHITE BLOOD CELL COUNT: 6.1 10*3/uL (ref 3.4–10.8)

## 2020-04-08 LAB — BASIC METABOLIC PANEL
BLOOD UREA NITROGEN: 20 mg/dL (ref 6–20)
BUN / CREAT RATIO: 14 (ref 9–23)
CALCIUM: 9.7 mg/dL (ref 8.7–10.2)
CHLORIDE: 109 mmol/L — ABNORMAL HIGH (ref 96–106)
CO2: 19 mmol/L — ABNORMAL LOW (ref 20–29)
CREATININE: 1.48 mg/dL — ABNORMAL HIGH (ref 0.57–1.00)
GLUCOSE: 92 mg/dL (ref 65–99)
POTASSIUM: 5 mmol/L (ref 3.5–5.2)
SODIUM: 142 mmol/L (ref 134–144)

## 2020-04-08 LAB — PHOSPHORUS: PHOSPHORUS, SERUM: 3.1 mg/dL (ref 3.0–4.3)

## 2020-04-10 DIAGNOSIS — A498 Other bacterial infections of unspecified site: Principal | ICD-10-CM

## 2020-04-10 DIAGNOSIS — Z940 Kidney transplant status: Principal | ICD-10-CM

## 2020-04-10 LAB — TACROLIMUS LEVEL: TACROLIMUS BLOOD: 7 ng/mL (ref 2.0–20.0)

## 2020-04-10 MED ORDER — FIDAXOMICIN 200 MG TABLET
ORAL_TABLET | Freq: Two times a day (BID) | ORAL | 0 refills | 10 days | Status: CP
Start: 2020-04-10 — End: ?

## 2020-04-10 MED ORDER — TACROLIMUS XR 1 MG TABLET,EXTENDED RELEASE 24 HR
ORAL_TABLET | Freq: Every day | ORAL | 11 refills | 30 days | Status: CP
Start: 2020-04-10 — End: ?
  Filled 2020-04-12: qty 30, 30d supply, fill #0

## 2020-04-10 MED ORDER — MYCOPHENOLATE SODIUM 180 MG TABLET,DELAYED RELEASE
ORAL_TABLET | Freq: Two times a day (BID) | ORAL | 11 refills | 30 days | Status: CP
Start: 2020-04-10 — End: 2021-04-10
  Filled 2020-04-12: qty 60, 30d supply, fill #0

## 2020-04-10 NOTE — Unmapped (Signed)
[facility] Refill Coordination Note    Specialty Medication(s) to be Shipped:   Transplant: Envarsus XR 1mg  and General Specialty: mycophenolate 180mg     Other medication(s) to be shipped: Pt declined Envarsus XR 4mg , Dificid 200mg  ( Refill requested)     Elaine Koch, DOB: Aug 19, 1985  Phone: [phone] (home)       All above HIPAA information was verified with patient.     Was a Nurse, learning disability used for this call? No    Completed refill call assessment today to schedule patient's medication shipment from the [facility] [phone]).       Specialty medication(s) and dose(s) confirmed: Regimen is correct and unchanged.   Changes to medications: Elaine Koch reports no changes at this time.  Changes to insurance: No  Questions for the pharmacist: No    Confirmed patient received Welcome Packet with first shipment. The patient will receive a drug information handout for each medication shipped and additional FDA Medication Guides as required.       DISEASE/MEDICATION-SPECIFIC INFORMATION        N/A    SPECIALTY MEDICATION ADHERENCE     Medication Adherence    Patient reported X missed doses in the last month: 0  Specialty Medication: Mycophenolate 180mg   Patient is on additional specialty medications: Yes  Additional Specialty Medications: Envarsus XR 1mg   Patient Reported Additional Medication X Missed Doses in the Last Month: 0  Patient is on more than two specialty medications: No                Envarsus XR 1 mg: 7 days of medicine on hand   Mycophenolate 180 mg: 7 days of medicine on hand         SHIPPING     Shipping address confirmed in Epic.     Delivery Scheduled: Yes, Expected medication delivery date: 04/13/20.  However, Rx request for refills was sent to the provider as there are none remaining.     Medication will be delivered via UPS to the prescription address in [facility].    Nancy Nordmann [facility] Specialty Technician

## 2020-04-11 ENCOUNTER — Telehealth
Admit: 2020-04-11 | Discharge: 2020-04-12 | Payer: MEDICARE | Attending: Infectious Disease | Primary: Infectious Disease

## 2020-04-11 MED ORDER — FIDAXOMICIN 200 MG TABLET
ORAL_TABLET | Freq: Two times a day (BID) | ORAL | 3 refills | 30 days | Status: CP
Start: 2020-04-11 — End: 2020-05-11
  Filled 2020-04-12: qty 60, 30d supply, fill #0

## 2020-04-11 NOTE — Unmapped (Addendum)
IMMUNOCOMPROMISED HOST INFECTIOUS DISEASE PROGRESS NOTE        I spent 8 minutes on the real-time audio and video visit with the patient on the date of service. I spent an additional 20 minutes on pre- and post-visit activities on the date of service.     The patient was not located and I was not located within 250 yards of a hospital based location during the real-time audio and video visit. The patient was physically located in [place] or a state in which I am permitted to provide care. The patient and/or parent/guardian understood that s/he may incur co-pays and cost sharing, and agreed to the telemedicine visit. The visit was reasonable and appropriate under the circumstances given the patient's presentation at the time.    The patient and/or parent/guardian has been advised of the potential risks and limitations of this mode of treatment (including, but not limited to, the absence of in-person examination) and has agreed to be treated using telemedicine. The patient's/patient's family's questions regarding telemedicine have been answered.    If the visit was completed in an ambulatory setting, the patient and/or parent/guardian has also been advised to contact their provider???s office for worsening conditions, and seek emergency medical treatment and/or call 911 if the patient deems either necessary.    Assessment/Plan:     [name] is a 35 y.o. female who presents over video for CMV follow up.     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  ??  #Sickle cell trait  ??  # Mild post-transplant CKD  - baseline Cr 1.5  ??  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, clinical relapse 07/28/2019, 08/23/2019, 09/06/2019, 11/29/19, 03/06/20  - sx recurred after vanc tx, retreated with vanc and taper but relapsed dropping from vanc QID to??vancomycin??Q2days  - 08/27/19-7/26??fidaxomicin  - 8/6 bezlotoxumab  - 8/2 relapsed on vancomycin taper -> 8/4 change to fidaxomicin once daily for suppression -> 8/31 vanc taper --> around 10/12 tapered from daily to twice weekly (relapsed symptoms) --> 11/28/19 vancomycin 125mg  QID -> 11/30/2019 fidaxomicin 200mg  bid -> 11/12 fidaxomicin daily (until 12/31)  - 02/14/2020 FMT (complicated by Proteus bacteremia/UTI 1/6)  - 1/24 vanc BID -> 1/28 vanc qid -> fidaxomicin bid 2/18-2/27 -> 2/28 vanc bid    Prior infections  # 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 faecalis UTI 10/01/19  # Proteus (pan-S ex I-cefazolin) UTI/bacteremia 02/17/20  ??  Recommendations:  - Continue vancomycin bid unless she can get fidaxomicin bid filled for chronic suppression as I think she did better on this agent historically.   - We can wait to see what the cause of her son's diarrhea is, but I expect that she will need testing for C diff, GI path panel, CMV and adenovirus PCR stool. Dr. Gaylyn Rong notes that she has had patients with recurrent diarrhea and negative C diff testing who have still benefited from FMT.   - I will continue to discuss risks/benefits of repeat FMT with Dr. Fara Boros. I wonder if FMT via colonoscopy is the best approach for her. While this is the most effective method, I wonder of the bowel cleanse sent her up for the urosepsis and if she would do  better with another type of FMT administration.   - Continue 1g D-mannose after intercourse, repeat once ~12h later (trial to see if this decreases UTIs)  - CMV PCR at least q2wks until ~ mid April  - HPV-9 #2 at next in person visit    Follow up in ~1 month virtually    Subjective   Interval History:    Elaine Koch completed 10 days of fidaxomicin on 2/27 - she notes that she had only 2 episodes of water diarrhea daily while on fidaxomicin. She said she made it until about mid-day Mon 2/28 before her abdominal cramping started and she took a vancomycin dose. She also notes that her son now has diarrhea - he is getting testing for RSV/covid/flu then if negative will get a C diff test. No recent UTIs.     Medications:  Antimicrobials:  vanc 125mg  once on 2/28    Prior/Current immunomodulators:  Myfortic 180mg  bid  tacro   pred 5  Other medications reviewed.    Objective     Vital signs:  There were no vitals taken for this visit.    Physical Exam:  GEN:  looks well, no apparent distress  EYES: sclerae anicteric and non injected   ENT: MMM, no thrush  PULM: normal WOB on RA  SKIN: no rash on limited skin exam  NEURO:no tremor noted and facial expression symmetric  PSYCH:attentive, appropriate affect, good eye contact, fluent speech   No change in PE since last phone visit    Labs:  Lab Results   Component Value Date    WBC 6.1 04/07/2020    WBC 7.8 03/31/2020    WBC 5.3 03/23/2020    WBC 5.8 03/17/2020    HGB 10.5 (L) 04/07/2020    HCT 31.7 (L) 04/07/2020    Platelet 315 04/07/2020    Absolute Neutrophils 4.7 04/07/2020    Absolute Lymphocytes 0.7 04/07/2020    Absolute Eosinophils 0.2 04/07/2020    Sodium 142 04/07/2020    Potassium 5.0 04/07/2020    BUN 20 04/07/2020    Creatinine 1.48 (H) 04/07/2020    Creatinine 1.60 (H) 03/31/2020    Creatinine 1.52 (H) 03/23/2020    Creatinine 1.39 (H) 03/21/2020    Creatinine 1.96 (H) 03/17/2020    Glucose 92 03/23/2020    Magnesium 1.5 (L) 04/07/2020    Albumin 3.6 03/23/2020    Total Bilirubin 1.0 03/23/2020    AST 23 03/23/2020    ALT 35 03/23/2020    Alkaline Phosphatase 66 03/23/2020    INR 1.08 02/17/2020    CRP 228.0 (H) 02/18/2020    CRP 105.0 (H) 02/18/2020   wbc ok  Cr stable  ALT ok    Microbiology:  No new positive cultures  Urine cytology without decoy cells    Lab Results   Component Value Date    CMV Viral Ld Not Detected 08/06/2019    CMV Viral Ld Not Detected 07/13/2019    CMV Viral Ld Not Detected 06/01/2019 CMV Quant Negative 03/17/2020    CMV Quant Undetected 02/21/2020    CMV Quant Negative 01/21/2020    CMV Quant Negative 01/21/2020     Imaging:  None new

## 2020-04-12 MED FILL — SODIUM BICARBONATE 650 MG TABLET: ORAL | 30 days supply | Qty: 180 | Fill #2

## 2020-04-15 LAB — MAGNESIUM: MAGNESIUM: 1.6 mg/dL (ref 1.6–2.3)

## 2020-04-17 DIAGNOSIS — Z940 Kidney transplant status: Principal | ICD-10-CM

## 2020-04-17 DIAGNOSIS — D849 Immunodeficiency, unspecified: Principal | ICD-10-CM

## 2020-04-17 LAB — CMV DNA, QUANTITATIVE, PCR: CMV QUANT: NEGATIVE [IU]/mL

## 2020-04-17 LAB — TACROLIMUS LEVEL: TACROLIMUS BLOOD: 6.7 ng/mL (ref 2.0–20.0)

## 2020-04-17 MED ORDER — TACROLIMUS XR 4 MG TABLET,EXTENDED RELEASE 24 HR
ORAL_TABLET | Freq: Every day | ORAL | 11 refills | 30.00000 days | Status: CP
Start: 2020-04-17 — End: ?

## 2020-04-17 MED ORDER — TACROLIMUS XR 1 MG TABLET,EXTENDED RELEASE 24 HR
ORAL_TABLET | Freq: Every day | ORAL | 5 refills | 30 days | Status: CP
Start: 2020-04-17 — End: ?
  Filled 2020-04-18: qty 60, 30d supply, fill #0

## 2020-04-17 MED ORDER — MYCOPHENOLATE SODIUM 180 MG TABLET,DELAYED RELEASE
ORAL_TABLET | Freq: Two times a day (BID) | ORAL | 11 refills | 30.00000 days | Status: CP
Start: 2020-04-17 — End: 2021-04-17
  Filled 2020-04-18: qty 60, 30d supply, fill #0

## 2020-04-17 NOTE — Unmapped (Signed)
Clinical Assessment Needed For: Dose Change  Medication: Envarsus XR 1mg  tablet  Last Fill Date/Day Supply: 04/12/2020 / 30 days  Copay $0  Was previous dose already scheduled to fill: No    Notes to Pharmacist: N/A    Clinical Assessment Needed For: Dose Change  Medication: Envarsus XR 4mg  tablet  Last Fill Date/Day Supply: 03/13/2020 / 30 days  Copay $0  Was previous dose already scheduled to fill: No    Notes to Pharmacist: N/A

## 2020-04-18 MED FILL — ENVARSUS XR 4 MG TABLET,EXTENDED RELEASE: ORAL | 30 days supply | Qty: 120 | Fill #0

## 2020-04-18 NOTE — Unmapped (Signed)
[facility] Refill Coordination Note    Specialty Medication(s) to be Shipped:   Transplant: Envarsus 1mg , Envarsus 4mg  and mycophenolate mofetil 180mg     Other medication(s) to be shipped: No additional medications requested for fill at this time     Elaine Koch, DOB: 1985/05/31  Phone: [phone] (home)       All above HIPAA information was verified with patient.     Was a Nurse, learning disability used for this call? No    Completed refill call assessment today to schedule patient's medication shipment from the [facility] [phone]).       Specialty medication(s) and dose(s) confirmed: Patient reports changes to the regimen as follows: Envarsus is now 18mg  daily **new rx's are on profile**   Changes to medications: Raeleigh reports no changes at this time.  Changes to insurance: No  Questions for the pharmacist: No    Confirmed patient received Welcome Packet with first shipment. The patient will receive a drug information handout for each medication shipped and additional FDA Medication Guides as required.       DISEASE/MEDICATION-SPECIFIC INFORMATION        N/A    SPECIALTY MEDICATION ADHERENCE     Medication Adherence    Patient reported X missed doses in the last month: 0  Specialty Medication: Envarsus 1mg   Patient is on additional specialty medications: Yes  Additional Specialty Medications: Envarsus 4mg   Patient Reported Additional Medication X Missed Doses in the Last Month: 0  Patient is on more than two specialty medications: Yes  Specialty Medication: Mycophenolate 180mg   Patient Reported Additional Medication X Missed Doses in the Last Month: 0        Envarsus 1 mg: 7 days of medicine on hand   Envarsus 4 mg: 7 days of medicine on hand   Mycophenolate 180 mg: 0 days of medicine on hand     SHIPPING     Shipping address confirmed in Epic.     Delivery Scheduled: Yes, Expected medication delivery date: 04/19/2020.     Medication will be delivered via UPS to the prescription address in Epic WAM.    Lorelei Pont [facility] Specialty Technician

## 2020-04-20 MED FILL — SODIUM BICARBONATE 650 MG TABLET: ORAL | 30 days supply | Qty: 180 | Fill #3

## 2020-04-22 LAB — CBC W/ DIFFERENTIAL
BANDED NEUTROPHILS ABSOLUTE COUNT: 0 10*3/uL (ref 0.0–0.1)
BASOPHILS ABSOLUTE COUNT: 0 10*3/uL (ref 0.0–0.2)
BASOPHILS RELATIVE PERCENT: 1 %
EOSINOPHILS ABSOLUTE COUNT: 0.3 10*3/uL (ref 0.0–0.4)
EOSINOPHILS RELATIVE PERCENT: 4 %
HEMATOCRIT: 31.8 % — ABNORMAL LOW (ref 34.0–46.6)
HEMOGLOBIN: 10.5 g/dL — ABNORMAL LOW (ref 11.1–15.9)
IMMATURE GRANULOCYTES: 0 %
LYMPHOCYTES ABSOLUTE COUNT: 0.9 10*3/uL (ref 0.7–3.1)
LYMPHOCYTES RELATIVE PERCENT: 14 %
MEAN CORPUSCULAR HEMOGLOBIN CONC: 33 g/dL (ref 31.5–35.7)
MEAN CORPUSCULAR HEMOGLOBIN: 31.4 pg (ref 26.6–33.0)
MEAN CORPUSCULAR VOLUME: 95 fL (ref 79–97)
MONOCYTES ABSOLUTE COUNT: 0.5 10*3/uL (ref 0.1–0.9)
MONOCYTES RELATIVE PERCENT: 8 %
NEUTROPHILS ABSOLUTE COUNT: 4.7 10*3/uL (ref 1.4–7.0)
NEUTROPHILS RELATIVE PERCENT: 73 %
PLATELET COUNT: 287 10*3/uL (ref 150–450)
RED BLOOD CELL COUNT: 3.34 x10E6/uL — ABNORMAL LOW (ref 3.77–5.28)
RED CELL DISTRIBUTION WIDTH: 12.9 % (ref 11.7–15.4)
WHITE BLOOD CELL COUNT: 6.3 10*3/uL (ref 3.4–10.8)

## 2020-04-22 LAB — BASIC METABOLIC PANEL
BLOOD UREA NITROGEN: 23 mg/dL — ABNORMAL HIGH (ref 6–20)
BUN / CREAT RATIO: 15 (ref 9–23)
CALCIUM: 9.4 mg/dL (ref 8.7–10.2)
CHLORIDE: 110 mmol/L — ABNORMAL HIGH (ref 96–106)
CO2: 19 mmol/L — ABNORMAL LOW (ref 20–29)
CREATININE: 1.56 mg/dL — ABNORMAL HIGH (ref 0.57–1.00)
GLUCOSE: 101 mg/dL — ABNORMAL HIGH (ref 65–99)
POTASSIUM: 5.1 mmol/L (ref 3.5–5.2)
SODIUM: 144 mmol/L (ref 134–144)

## 2020-04-22 LAB — MAGNESIUM: MAGNESIUM: 1.4 mg/dL — ABNORMAL LOW (ref 1.6–2.3)

## 2020-04-22 LAB — PHOSPHORUS: PHOSPHORUS, SERUM: 3.1 mg/dL (ref 3.0–4.3)

## 2020-04-24 LAB — TACROLIMUS LEVEL: TACROLIMUS BLOOD: 5.1 ng/mL (ref 2.0–20.0)

## 2020-04-29 LAB — CBC W/ DIFFERENTIAL
BANDED NEUTROPHILS ABSOLUTE COUNT: 0 10*3/uL (ref 0.0–0.1)
BASOPHILS ABSOLUTE COUNT: 0 10*3/uL (ref 0.0–0.2)
BASOPHILS RELATIVE PERCENT: 1 %
EOSINOPHILS ABSOLUTE COUNT: 0.2 10*3/uL (ref 0.0–0.4)
EOSINOPHILS RELATIVE PERCENT: 4 %
HEMATOCRIT: 32.6 % — ABNORMAL LOW (ref 34.0–46.6)
HEMOGLOBIN: 10.8 g/dL — ABNORMAL LOW (ref 11.1–15.9)
IMMATURE GRANULOCYTES: 0 %
LYMPHOCYTES ABSOLUTE COUNT: 0.8 10*3/uL (ref 0.7–3.1)
LYMPHOCYTES RELATIVE PERCENT: 14 %
MEAN CORPUSCULAR HEMOGLOBIN CONC: 33.1 g/dL (ref 31.5–35.7)
MEAN CORPUSCULAR HEMOGLOBIN: 30.9 pg (ref 26.6–33.0)
MEAN CORPUSCULAR VOLUME: 93 fL (ref 79–97)
MONOCYTES ABSOLUTE COUNT: 0.6 10*3/uL (ref 0.1–0.9)
MONOCYTES RELATIVE PERCENT: 11 %
NEUTROPHILS ABSOLUTE COUNT: 3.8 10*3/uL (ref 1.4–7.0)
NEUTROPHILS RELATIVE PERCENT: 70 %
PLATELET COUNT: 281 10*3/uL (ref 150–450)
RED BLOOD CELL COUNT: 3.49 x10E6/uL — ABNORMAL LOW (ref 3.77–5.28)
RED CELL DISTRIBUTION WIDTH: 12.8 % (ref 11.7–15.4)
WHITE BLOOD CELL COUNT: 5.4 10*3/uL (ref 3.4–10.8)

## 2020-04-29 LAB — RENAL FUNCTION PANEL
ALBUMIN: 4 g/dL (ref 3.8–4.8)
BLOOD UREA NITROGEN: 23 mg/dL — ABNORMAL HIGH (ref 6–20)
BUN / CREAT RATIO: 15 (ref 9–23)
CALCIUM: 9.6 mg/dL (ref 8.7–10.2)
CHLORIDE: 113 mmol/L — ABNORMAL HIGH (ref 96–106)
CO2: 19 mmol/L — ABNORMAL LOW (ref 20–29)
CREATININE: 1.56 mg/dL — ABNORMAL HIGH (ref 0.57–1.00)
EGFR: 45 mL/min/{1.73_m2} — ABNORMAL LOW
GLUCOSE: 95 mg/dL (ref 65–99)
PHOSPHORUS, SERUM: 3.7 mg/dL (ref 3.0–4.3)
POTASSIUM: 5.2 mmol/L (ref 3.5–5.2)
SODIUM: 145 mmol/L — ABNORMAL HIGH (ref 134–144)

## 2020-04-29 LAB — MAGNESIUM: MAGNESIUM: 1.8 mg/dL (ref 1.6–2.3)

## 2020-05-01 DIAGNOSIS — D849 Immunodeficiency, unspecified: Principal | ICD-10-CM

## 2020-05-01 DIAGNOSIS — Z940 Kidney transplant status: Principal | ICD-10-CM

## 2020-05-01 LAB — TACROLIMUS LEVEL: TACROLIMUS BLOOD: 7.1 ng/mL (ref 2.0–20.0)

## 2020-05-06 LAB — MAGNESIUM: MAGNESIUM: 1.5 mg/dL — ABNORMAL LOW (ref 1.6–2.3)

## 2020-05-06 LAB — BASIC METABOLIC PANEL
BLOOD UREA NITROGEN: 26 mg/dL — ABNORMAL HIGH (ref 6–20)
BUN / CREAT RATIO: 17 (ref 9–23)
CALCIUM: 9.4 mg/dL (ref 8.7–10.2)
CHLORIDE: 108 mmol/L — ABNORMAL HIGH (ref 96–106)
CO2: 18 mmol/L — ABNORMAL LOW (ref 20–29)
CREATININE: 1.57 mg/dL — ABNORMAL HIGH (ref 0.57–1.00)
GLUCOSE: 88 mg/dL (ref 65–99)
POTASSIUM: 5 mmol/L (ref 3.5–5.2)
SODIUM: 141 mmol/L (ref 134–144)

## 2020-05-06 LAB — CBC W/ DIFFERENTIAL
BANDED NEUTROPHILS ABSOLUTE COUNT: 0 10*3/uL (ref 0.0–0.1)
BASOPHILS ABSOLUTE COUNT: 0 10*3/uL (ref 0.0–0.2)
BASOPHILS RELATIVE PERCENT: 1 %
EOSINOPHILS ABSOLUTE COUNT: 0.2 10*3/uL (ref 0.0–0.4)
EOSINOPHILS RELATIVE PERCENT: 4 %
HEMATOCRIT: 31.9 % — ABNORMAL LOW (ref 34.0–46.6)
HEMOGLOBIN: 10.7 g/dL — ABNORMAL LOW (ref 11.1–15.9)
IMMATURE GRANULOCYTES: 0 %
LYMPHOCYTES ABSOLUTE COUNT: 0.8 10*3/uL (ref 0.7–3.1)
LYMPHOCYTES RELATIVE PERCENT: 15 %
MEAN CORPUSCULAR HEMOGLOBIN CONC: 33.5 g/dL (ref 31.5–35.7)
MEAN CORPUSCULAR HEMOGLOBIN: 30.7 pg (ref 26.6–33.0)
MEAN CORPUSCULAR VOLUME: 91 fL (ref 79–97)
MONOCYTES ABSOLUTE COUNT: 0.6 10*3/uL (ref 0.1–0.9)
MONOCYTES RELATIVE PERCENT: 11 %
NEUTROPHILS ABSOLUTE COUNT: 3.8 10*3/uL (ref 1.4–7.0)
NEUTROPHILS RELATIVE PERCENT: 69 %
PLATELET COUNT: 303 10*3/uL (ref 150–450)
RED BLOOD CELL COUNT: 3.49 x10E6/uL — ABNORMAL LOW (ref 3.77–5.28)
RED CELL DISTRIBUTION WIDTH: 12.8 % (ref 11.7–15.4)
WHITE BLOOD CELL COUNT: 5.5 10*3/uL (ref 3.4–10.8)

## 2020-05-06 LAB — PHOSPHORUS: PHOSPHORUS, SERUM: 3.7 mg/dL (ref 3.0–4.3)

## 2020-05-09 ENCOUNTER — Telehealth: Admit: 2020-05-09 | Payer: MEDICARE | Attending: Infectious Disease | Primary: Infectious Disease

## 2020-05-09 LAB — TACROLIMUS LEVEL: TACROLIMUS BLOOD: 8.9 ng/mL (ref 2.0–20.0)

## 2020-05-09 NOTE — Unmapped (Signed)
IMMUNOCOMPROMISED HOST INFECTIOUS DISEASE PROGRESS NOTE        I spent 2 minutes on the phone visit with the patient on the date of service. I spent an additional 10 minutes on pre- and post-visit activities on the date of service. No chargeable visit.     The patient was not located and I was located within 250 yards of a hospital based location during the phone visit. The patient was physically located in [place] or a state in which I am permitted to provide care. The patient and/or parent/guardian understood that s/he may incur co-pays and cost sharing, and agreed to the telemedicine visit. The visit was reasonable and appropriate under the circumstances given the patient's presentation at the time.    The patient and/or parent/guardian has been advised of the potential risks and limitations of this mode of treatment (including, but not limited to, the absence of in-person examination) and has agreed to be treated using telemedicine. The patient's/patient's family's questions regarding telemedicine have been answered.    If the visit was completed in an ambulatory setting, the patient and/or parent/guardian has also been advised to contact their provider???s office for worsening conditions, and seek emergency medical treatment and/or call 911 if the patient deems either necessary.    Assessment/Plan:     Elaine Koch is a 35 y.o. 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  ??  #Sickle cell trait  ??  # Mild post-transplant CKD  - baseline Cr 1.5  ??  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, clinical relapse 07/28/2019, 08/23/2019, 09/06/2019, 11/29/19, 03/06/20  - sx recurred after vanc tx, retreated with vanc and taper but relapsed dropping from vanc QID to??vancomycin??Q2days  - 08/27/19-7/26??fidaxomicin  - 8/6 bezlotoxumab  - 8/2 relapsed on vancomycin taper -> 8/4 change to fidaxomicin once daily for suppression -> 8/31 vanc taper --> around 10/12 tapered from daily to twice weekly (relapsed symptoms) --> 11/28/19 vancomycin 125mg  QID -> 11/30/2019 fidaxomicin 200mg  bid -> 11/12 fidaxomicin daily (until 12/31)  - 02/14/2020 FMT (complicated by Proteus bacteremia/UTI 1/6)  - 1/24 vanc BID -> 1/28 vanc qid -> fidaxomicin bid 2/18-2/27 -> 2/28 vanc bid -> 3/5 fidaxomicin bid    Prior infections  # 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 faecalis UTI 10/01/19  # Proteus (pan-S ex I-cefazolin) UTI/bacteremia 02/17/20  ??  Recommendations:  - Continue fidaxomicin 200mg  bid  - She sees Dr. Fara Boros on 4/1  - Follow CMV PCR until ~ mid April  - HPV-9 #2 at next in person visit    Follow up PRN    Subjective   Interval History:    She was at work during today's visit and unable to remain on the phone. She is taking fidaxomicin and has follow up with Dr. Fara Boros on 4/1.     Medications:  Antimicrobials:  fidaxomicin 200mg  bid    Prior/Current immunomodulators:  Myfortic 180mg  bid  tacro   pred 5  Other medications reviewed.    Objective     Physical Exam:  PSYCH:attentive, appropriate affect, fluent speech     Labs:  Lab Results   Component Value Date    WBC 5.5 05/05/2020  WBC 5.4 04/28/2020    WBC 6.3 04/21/2020    HGB 10.7 (L) 05/05/2020    HCT 31.9 (L) 05/05/2020    Platelet 303 05/05/2020    Absolute Neutrophils 3.8 05/05/2020    Absolute Lymphocytes 0.8 05/05/2020    Absolute Eosinophils 0.2 05/05/2020    Sodium 141 05/05/2020    Potassium 5.0 05/05/2020    BUN 26 (H) 05/05/2020    Creatinine 1.57 (H) 05/05/2020    Creatinine 1.56 (H) 04/28/2020    Creatinine 1.56 (H) 04/21/2020    Glucose 92 03/23/2020    Magnesium 1.5 (L) 05/05/2020    Albumin 3.6 03/23/2020    Total Bilirubin 1.0 03/23/2020    AST 23 03/23/2020    ALT 35 03/23/2020    Alkaline Phosphatase 66 03/23/2020    INR 1.08 02/17/2020    CRP 228.0 (H) 02/18/2020    CRP 105.0 (H) 02/18/2020   wbc ok  Cr stable  ALT ok    Microbiology:    Lab Results   Component Value Date    CMV Quant Negative 04/14/2020    CMV Quant Negative 03/17/2020    CMV Quant Undetected 02/21/2020    CMV Quant Negative 01/21/2020    CMV Quant Negative 01/21/2020     Imaging:  None new

## 2020-05-12 ENCOUNTER — Ambulatory Visit: Admit: 2020-05-12 | Discharge: 2020-05-13 | Payer: MEDICARE

## 2020-05-12 DIAGNOSIS — E559 Vitamin D deficiency, unspecified: Principal | ICD-10-CM

## 2020-05-12 DIAGNOSIS — A498 Other bacterial infections of unspecified site: Principal | ICD-10-CM

## 2020-05-12 DIAGNOSIS — Z9483 Pancreas transplant status: Principal | ICD-10-CM

## 2020-05-12 DIAGNOSIS — Z940 Kidney transplant status: Principal | ICD-10-CM

## 2020-05-12 LAB — CBC W/ AUTO DIFF
BASOPHILS ABSOLUTE COUNT: 0 10*9/L (ref 0.0–0.1)
BASOPHILS RELATIVE PERCENT: 0.8 %
EOSINOPHILS ABSOLUTE COUNT: 0.2 10*9/L (ref 0.0–0.5)
EOSINOPHILS RELATIVE PERCENT: 4 %
HEMATOCRIT: 32 % — ABNORMAL LOW (ref 34.0–44.0)
HEMOGLOBIN: 11.1 g/dL — ABNORMAL LOW (ref 11.3–14.9)
LYMPHOCYTES ABSOLUTE COUNT: 0.8 10*9/L — ABNORMAL LOW (ref 1.1–3.6)
LYMPHOCYTES RELATIVE PERCENT: 14.1 %
MEAN CORPUSCULAR HEMOGLOBIN CONC: 34.5 g/dL (ref 32.0–36.0)
MEAN CORPUSCULAR HEMOGLOBIN: 31.1 pg (ref 25.9–32.4)
MEAN CORPUSCULAR VOLUME: 90.2 fL (ref 77.6–95.7)
MEAN PLATELET VOLUME: 7.7 fL (ref 6.8–10.7)
MONOCYTES ABSOLUTE COUNT: 0.4 10*9/L (ref 0.3–0.8)
MONOCYTES RELATIVE PERCENT: 6.4 %
NEUTROPHILS ABSOLUTE COUNT: 4.4 10*9/L (ref 1.8–7.8)
NEUTROPHILS RELATIVE PERCENT: 74.7 %
NUCLEATED RED BLOOD CELLS: 0 /100{WBCs} (ref <=4)
PLATELET COUNT: 272 10*9/L (ref 150–450)
RED BLOOD CELL COUNT: 3.55 10*12/L — ABNORMAL LOW (ref 3.95–5.13)
RED CELL DISTRIBUTION WIDTH: 13.3 % (ref 12.2–15.2)
WBC ADJUSTED: 5.9 10*9/L (ref 3.6–11.2)

## 2020-05-12 LAB — COMPREHENSIVE METABOLIC PANEL
ALBUMIN: 3.5 g/dL (ref 3.4–5.0)
ALKALINE PHOSPHATASE: 79 U/L (ref 46–116)
ALT (SGPT): 22 U/L (ref 10–49)
ANION GAP: 7 mmol/L (ref 5–14)
AST (SGOT): 19 U/L (ref <=34)
BILIRUBIN TOTAL: 0.5 mg/dL (ref 0.3–1.2)
BLOOD UREA NITROGEN: 20 mg/dL (ref 9–23)
BUN / CREAT RATIO: 14
CALCIUM: 10 mg/dL (ref 8.7–10.4)
CHLORIDE: 114 mmol/L — ABNORMAL HIGH (ref 98–107)
CO2: 23.4 mmol/L (ref 20.0–31.0)
CREATININE: 1.44 mg/dL — ABNORMAL HIGH
EGFR CKD-EPI AA FEMALE: 55 mL/min/{1.73_m2} — ABNORMAL LOW (ref >=60)
EGFR CKD-EPI NON-AA FEMALE: 48 mL/min/{1.73_m2} — ABNORMAL LOW (ref >=60)
GLUCOSE RANDOM: 91 mg/dL (ref 70–99)
POTASSIUM: 4.7 mmol/L (ref 3.5–5.1)
PROTEIN TOTAL: 6.8 g/dL (ref 5.7–8.2)
SODIUM: 144 mmol/L (ref 135–145)

## 2020-05-12 LAB — MAGNESIUM: MAGNESIUM: 1.7 mg/dL (ref 1.6–2.6)

## 2020-05-12 LAB — LIPASE: LIPASE: 41 U/L (ref 12–53)

## 2020-05-12 LAB — AMYLASE: AMYLASE: 185 U/L — ABNORMAL HIGH (ref 30–118)

## 2020-05-12 LAB — PHOSPHORUS: PHOSPHORUS: 3 mg/dL (ref 2.4–5.1)

## 2020-05-12 NOTE — Unmapped (Signed)
Elaine Koch FACULTY GASTROENTEROLOGY CONSULTATION       REFERRING PROVIDER:    Park Breed, MD  [address]  [id number]  [place],  [place] [address]    PRIMARY CARE PROVIDER: Henderson Cloud, MD    ASSESSMENT:      Elaine Koch is a 35 y.o. 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 difficile infection that began after C. difficile was tested as part of a broad work-up following vomiting and headaches in June.   She has had improvement of diarrhea on the fidaxomicin and higher doses of vancomycin.      She is returning having had a colonoscopy based fecal transplant on 02/14/2020, and then 3 days later presenting with a urinary tract infection, pyelonephritis and gram-negative rod bacteremia, followed by diarrhea that was presumptively diagnosed as C. difficile recurrence and treated with antibiotics.  Although I think it is possible that the urinary tract infection happened in the setting of bowel lavage, it is also possible that she got this in the setting of her sexual activity and I discussed with her the importance of urinating frequently after sex.  We discussed the options of fecal transplant with or without bowel lavage.   The studies that did perform upper???administered fecal transplant including the seminal study by Katheren Puller at al and also the [place] study of capsules vs. Colonoscopy both included bowel lavage as part of the protocol, even for upper delivery.   In addition, given her immunosuppression, I am concerned that giving an upper???delivered fecal transplant would put her at risk for bacterial translocation, as intestinal permeability is higher in the small intestine compared to the colon.  The 2 patients who suffered gram-negative bacteremia from fecal transplant both had upper delivery.  Thus, for these reasons I discussed with her that I prefer lower delivery in her case and thus we will plan for a colonoscopy???based fecal transplant.     We discussed the benefits and risks of FMT including that it is highly effective therapy for recurrent C. Diff; that transmission of disease from donor to recipient is possible, that transient gastrointestinal symptoms such as diarrhea and nausea may occur; that it is considered an experimental therapy, and the alternative which would be further antibiotics.    PLAN:        - colonoscopy-based fecal microbiota transplantation with suppressive fidaxomicin until 3 days before transplant.  There are extensive patient instructions on the patient instructions tab    -rtc 4-5 months      Aleatha Borer, MD MSc  Associate Professor of Medicine, [facility]    Patient???s other care providers, please feel free to contact me via email ([email or web address]).    HPI: Elaine Koch is a 35 y.o. female seen in consultation at the request of Dr. Reynold Bowen for evaluation of recurrent C. difficile infection.    Elaine Koch is a 35 year old woman with a history of type 1 diabetes, kidney failure, kidney and pancreas transplant on May 03, 2019 whom I see for recurrent C. difficile infection.    She had colonoscopy???based fecal transplant on 02/14/2020.     3 days later, she presented to the hospital with acute kidney injury, vomiting, diarrhea and was found to have gram-negative rod bacteremia with Proteus mirabilis and urinary tract infection with the same.  She was treated with antibiotics.  She started having diarrhea in late January  and was started empirically on vancomycin, which was not helpful and then fidaxomicin, which she is still on.  She is having good days mostly on the fidaxomicin, but also bad days.  For example today, she had diarrhea and abdominal cramping.        Studies/ prior workup reviewed:  12-18-2019-complete blood count???white blood count 7.3, hemoglobin 10.1, platelets 304    C. difficile assays???07/14/2019???C. difficile assay was positive    CT abdomen and pelvis without contrast???moderate cardiomegaly, prominence of lower lobe pulmonary venous structures, hypodensity along the falciform ligament, question focal hepatic steatosis, trace ascites along the right lower quadrant pericolic gutter mild but advanced for age aorto iliac and atherosclerotic vascular disease, mildly enlarged uterus    12/01/2018???message from Dr. Cyril Loosen has had 2 courses of fidaxomicin, bezlotoxumab, extremely slow bank taper and she contacted me on 12/01/2019 over the weekend that her C. difficile symptoms are back    Then follow up message- that patient relapsed during vancomycin taper, not previously been able to regain symptom control with 4 times a day vancomycin so she started third case of fidaxomicin on 12/01/2019???she may just need to stay on fidaxomicin daily until transplant.  Her CMV PCR came back over thousand today.  We will see if she still C. difficile PCR positive.    PAST MEDICAL HISTORY:  Past Medical History:   Diagnosis Date   ??? Chronic hypertension during pregnancy, antepartum 07/22/2015    Overview:  Methyldopa recommended per nephrologist if needed   ??? Diabetes mellitus type 1 (CMS-HCC)    ??? ESRD (end stage renal disease) (CMS-HCC)    ??? History of pre-eclampsia 10/24/2016   ??? 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 AV FISTULA PLACEMENT  2018   ??? CESAREAN SECTION     ??? PR COLONOSCOPY FLX DX W/COLLJ SPEC WHEN PFRMD N/A 02/14/2020    Procedure: COLONOSCOPY, FLEXIBLE, PROXIMAL TO SPLENIC FLEXURE; DIAGNOSTIC, W/WO COLLECTION SPECIMEN BY BRUSH OR WASH;  Surgeon: Carmon Ginsberg, MD;  [facility];  Service: Gastroenterology   ??? PR FECAL MICROBIOTA PREP INSTIL N/A 02/14/2020    Procedure: PREP W INSTILLATION FECAL MICROBIOTA, ANY METHOD;  Surgeon: Carmon Ginsberg, MD;  Location: [facility];  Service: Gastroenterology   ??? PR TRANSPLANT ALLOGRAFT PANCREAS N/A 05/03/2019    Procedure: TRANSPLANTATION OF PANCREATIC ALLOGRAFT;  Surgeon: Leona Carry, MD;  Location: MAIN OR [facility];  Service: Transplant   ??? PR TRANSPLANT,PREP CADAVER RENAL GRAFT N/A 05/03/2019    Procedure: [facility] STD PREP CAD DONR RENAL ALLOGFT PRIOR TO TRNSPLNT, INCL DISSEC/REM PERINEPH FAT, DIAPH/RTPER ATTAC;  Surgeon: Leona Carry, MD;  Location: MAIN OR [facility];  Service: Transplant   ??? PR TRANSPLANT,PREP DONOR PANCREAS N/A 05/03/2019    Procedure: [facility] STANDARD PREPARATION OF CADAVER DONOR PANCREAS ALLOGRAFT PRIOR TO TRANSPLANTATION;  Surgeon: Leona Carry, MD;  Location: MAIN OR [facility];  Service: Transplant   ??? PR TRANSPLANTATION OF KIDNEY N/A 05/03/2019    Procedure: RENAL ALLOTRANSPLANTATION, IMPLANTATION OF GRAFT; WITHOUT RECIPIENT NEPHRECTOMY;  Surgeon: Leona Carry, MD;  Location: MA[facility] OR [facility];  Service: Transplant   ??? TONSILLECTOMY         MEDICATIONS:    Current Outpatient Medications:   ???  amLODIPine (NORVASC) 5 MG tablet, Take 1 tablet (5 mg total) by mouth daily., Disp: 90 tablet, Rfl: 3  ???  aspirin (ECOTRIN) 81 MG tablet, Take 1 tablet (81 mg total) by mouth daily., Disp: 30 tablet, Rfl: 11  ???  cholecalciferol, vitamin D3-125 mcg, 5,000 unit,, 125 mcg (5,000 unit) capsule, Take 1 capsule (125 mcg total) by mouth daily., Disp: 100 capsule, Rfl: 10  ???  fidaxomicin (DIFICID) 200 mg tablet, Take 1 tablet (200 mg total) by mouth every twelve (12) hours., Disp: 60 tablet, Rfl: 3  ???  mycophenolate (MYFORTIC) 180 MG EC tablet, Take 1 tablet (180 mg total) by mouth Two (2) times a day., Disp: 60 tablet, Rfl: 11  ???  predniSONE (DELTASONE) 5 MG tablet, Take 1 tablet (5 mg total) by mouth daily., Disp: 30 tablet, Rfl: 11  ???  sodium bicarbonate 650 mg tablet, Take 2 tablets (1,300 mg total) by mouth Three (3) times a day., Disp: 180 tablet, Rfl: 3  ???  tacrolimus (ENVARSUS XR) 1 mg Tb24 extended release tablet, Take 2 (1 mg) tablets by mouth daily with 4 (4mg ) tablets for total daily dose of 18mg , Disp: 60 tablet, Rfl: 5  ???  tacrolimus (ENVARSUS XR) 4 mg Tb24 extended release tablet, Take 4 (4 mg) tablets by mouth daily with 2 (1mg ) tablets for total daily dose of 18mg ., Disp: 120 tablet, Rfl: 11    ALLERGIES:  Iodinated contrast media, Nickel, Propranolol, Eye irrigating solution [ophthalmic irrigation solution], Iodine, Naltrexone, and Uni-cortrom    SOCIAL HISTORY:  She did study at [employer] [facility] [employer] [facility]. She has a 9-year-old son. Her mother is her transplant support for the most part. She does work from home with [employer]. Does not smoke, occasionally takes alcohol socially    FAMILY HISTORY:  Family history of colorectal cancer, Crohn's or ulcerative colitis: No colon cancer    VITAL SIGNS AND PHYSICAL EXAM:  BP 136/86  - Pulse 86  - Temp 36.5 ??C (97.7 ??F)  - Wt 83.5 kg (184 lb)  - BMI 30.62 kg/m??   Constitutional Pleasant, normal affect   Mental Status: Thought organized, appropriate affect, pleasantly interactive, not anxious appearing.    HEENT: No cervical lymphadenopathy, sclerae anicteric   Respiratory:  unlabored breathing.   Cardiac:  Euvolemic, regular rate    GI:  Soft, non-distended, non-tender, no organomegaly or masses. Well-healed scar in the mid abdomen   Perianal/Rectal Exam     Extremities: No edema, well perfused.    Musculoskeletal:  No joint swelling or tenderness noted, no deformities.   Skin:  No rashes, jaundice or skin lesions noted.   Neuro:  No focal deficits.

## 2020-05-12 NOTE — Unmapped (Addendum)
-   as we discussed, we will plan for another fecal microbiota transplantation   - stay on fidaxomicin until 5 days before the FMT, then you'll do a bowel prep and come to the colonoscopy    My contact numbers:  - You are welcome to call my nurse Ms. Royster's line and leave me a message at [phone]  -  If you ever need to call to make or reschedule a clinic appointment, pls call [phone]. [phone]  - If you ever need to call or reschedule an ENDOSCOPY appointment, that number is [phone].  - For records, etc. my fax number is: [phone]  - For nights and weekends urgent issues, call [phone] and ask for the gastroenterologist on call

## 2020-05-13 LAB — TACROLIMUS LEVEL, TROUGH: TACROLIMUS, TROUGH: 8.7 ng/mL (ref 5.0–15.0)

## 2020-05-15 DIAGNOSIS — Z940 Kidney transplant status: Principal | ICD-10-CM

## 2020-05-15 DIAGNOSIS — D849 Immunodeficiency, unspecified: Principal | ICD-10-CM

## 2020-05-15 LAB — VITAMIN D 25 HYDROXY: VITAMIN D, TOTAL (25OH): 55.4 ng/mL (ref 20.0–80.0)

## 2020-05-16 LAB — BK VIRUS QUANTITATIVE PCR, BLOOD: BK BLOOD RESULT: NOT DETECTED

## 2020-05-16 MED ORDER — SODIUM BICARBONATE 650 MG TABLET
ORAL_TABLET | Freq: Three times a day (TID) | ORAL | 3 refills | 30.00000 days
Start: 2020-05-16 — End: 2021-05-16

## 2020-05-16 MED FILL — ENVARSUS XR 1 MG TABLET,EXTENDED RELEASE: ORAL | 30 days supply | Qty: 60 | Fill #1

## 2020-05-16 MED FILL — MYCOPHENOLATE SODIUM 180 MG TABLET,DELAYED RELEASE: ORAL | 30 days supply | Qty: 60 | Fill #1

## 2020-05-16 MED FILL — ENVARSUS XR 4 MG TABLET,EXTENDED RELEASE: ORAL | 30 days supply | Qty: 120 | Fill #1

## 2020-05-16 NOTE — Unmapped (Signed)
[facility] Refill Coordination Note    Specialty Medication(s) to be Shipped:   Transplant: Envarsus 1mg , Envarsus 4mg  and mycophenolate mofetil 180mg     Other medication(s) to be shipped: dificid, aspirin and sodium bicarb     Elaine [facility], DOB: 1985/08/07  Phone: [phone] (home)       All above HIPAA information was verified with patient.     Was a Nurse, learning disability used for this call? No    Completed refill call assessment today to schedule patient's medication shipment from the [facility] [phone]).       Specialty medication(s) and dose(s) confirmed: Regimen is correct and unchanged.   Changes to medications: [name] reports no changes at this time.  Changes to insurance: No  Questions for the pharmacist: No    Confirmed patient received a Conservation officer, historic buildings and a Surveyor, mining with first shipment. The patient will receive a drug information handout for each medication shipped and additional FDA Medication Guides as required.       DISEASE/MEDICATION-SPECIFIC INFORMATION        N/A    SPECIALTY MEDICATION ADHERENCE     Medication Adherence    Patient reported X missed doses in the last month: 0  Specialty Medication: Envarsus 1mg   Patient is on additional specialty medications: Yes  Additional Specialty Medications: Envarsus 4mg   Patient Reported Additional Medication X Missed Doses in the Last Month: 0  Patient is on more than two specialty medications: Yes  Specialty Medication: Mycophenolate 180mg   Patient Reported Additional Medication X Missed Doses in the Last Month: 0        Envarsus 1 mg: 1 days of medicine on hand   Envarsus 4 mg: 1 days of medicine on hand   Mycophenolate 180 mg: 7 days of medicine on hand     SHIPPING     Shipping address confirmed in Epic.     Delivery Scheduled: Yes, Expected medication delivery date: 05/17/2020.     Medication will be delivered via UPS to the prescription address in Epic WAM.    [facility]orelei Pont [facility] Specialty Technician

## 2020-05-16 NOTE — Unmapped (Signed)
Pt request for RX Refill aspirin (ECOTRIN) 81 MG tablet

## 2020-05-16 NOTE — Unmapped (Signed)
Pt request for RX Refill sodium bicarbonate 650 mg tablet

## 2020-05-17 MED ORDER — ASPIRIN 81 MG TABLET,DELAYED RELEASE
ORAL_TABLET | Freq: Every day | ORAL | 11 refills | 30 days | Status: CP
Start: 2020-05-17 — End: 2021-05-17
  Filled 2020-05-18: qty 30, 30d supply, fill #0

## 2020-05-17 MED FILL — DIFICID 200 MG TABLET: ORAL | 30 days supply | Qty: 60 | Fill #1

## 2020-05-18 MED ORDER — PEG 3350-ELECTROLYTES 236 GRAM-22.74 GRAM-6.74 GRAM-5.86 GRAM SOLUTION
Freq: Once | ORAL | 0 refills | 1.00000 days | Status: CP
Start: 2020-05-18 — End: 2020-05-18
  Filled 2020-05-19: qty 4000, 1d supply, fill #0

## 2020-05-18 MED FILL — SODIUM BICARBONATE 650 MG TABLET: ORAL | 30 days supply | Qty: 180 | Fill #0

## 2020-05-20 LAB — MAGNESIUM: MAGNESIUM: 1.7 mg/dL (ref 1.6–2.3)

## 2020-05-20 LAB — CBC W/ DIFFERENTIAL
BANDED NEUTROPHILS ABSOLUTE COUNT: 0 10*3/uL (ref 0.0–0.1)
BASOPHILS ABSOLUTE COUNT: 0.1 10*3/uL (ref 0.0–0.2)
BASOPHILS RELATIVE PERCENT: 1 %
EOSINOPHILS ABSOLUTE COUNT: 0.2 10*3/uL (ref 0.0–0.4)
EOSINOPHILS RELATIVE PERCENT: 3 %
HEMATOCRIT: 33.5 % — ABNORMAL LOW (ref 34.0–46.6)
HEMOGLOBIN: 11.1 g/dL (ref 11.1–15.9)
IMMATURE GRANULOCYTES: 0 %
LYMPHOCYTES ABSOLUTE COUNT: 0.8 10*3/uL (ref 0.7–3.1)
LYMPHOCYTES RELATIVE PERCENT: 13 %
MEAN CORPUSCULAR HEMOGLOBIN CONC: 33.1 g/dL (ref 31.5–35.7)
MEAN CORPUSCULAR HEMOGLOBIN: 31.4 pg (ref 26.6–33.0)
MEAN CORPUSCULAR VOLUME: 95 fL (ref 79–97)
MONOCYTES ABSOLUTE COUNT: 0.5 10*3/uL (ref 0.1–0.9)
MONOCYTES RELATIVE PERCENT: 9 %
NEUTROPHILS ABSOLUTE COUNT: 4.7 10*3/uL (ref 1.4–7.0)
NEUTROPHILS RELATIVE PERCENT: 74 %
PLATELET COUNT: 288 10*3/uL (ref 150–450)
RED BLOOD CELL COUNT: 3.53 x10E6/uL — ABNORMAL LOW (ref 3.77–5.28)
RED CELL DISTRIBUTION WIDTH: 13.2 % (ref 11.7–15.4)
WHITE BLOOD CELL COUNT: 6.3 10*3/uL (ref 3.4–10.8)

## 2020-05-20 LAB — BASIC METABOLIC PANEL
BLOOD UREA NITROGEN: 25 mg/dL — ABNORMAL HIGH (ref 6–20)
BUN / CREAT RATIO: 16 (ref 9–23)
CALCIUM: 9.5 mg/dL (ref 8.7–10.2)
CHLORIDE: 110 mmol/L — ABNORMAL HIGH (ref 96–106)
CO2: 20 mmol/L (ref 20–29)
CREATININE: 1.61 mg/dL — ABNORMAL HIGH (ref 0.57–1.00)
GLUCOSE: 83 mg/dL (ref 65–99)
POTASSIUM: 5.5 mmol/L — ABNORMAL HIGH (ref 3.5–5.2)
SODIUM: 143 mmol/L (ref 134–144)

## 2020-05-20 LAB — PHOSPHORUS: PHOSPHORUS, SERUM: 3.8 mg/dL (ref 3.0–4.3)

## 2020-05-22 LAB — TACROLIMUS LEVEL: TACROLIMUS BLOOD: 11.7 ng/mL (ref 2.0–20.0)

## 2020-05-23 LAB — HLA DS POST TRANSPLANT
ANTI-DONOR DRW #2 MFI: 761 MFI
ANTI-DONOR HLA-A #1 MFI: 11 MFI
ANTI-DONOR HLA-A #2 MFI: 0 MFI
ANTI-DONOR HLA-B #1 MFI: 0 MFI
ANTI-DONOR HLA-B #2 MFI: 66 MFI
ANTI-DONOR HLA-C #1 MFI: 538 MFI
ANTI-DONOR HLA-C #2 MFI: 1895 MFI — ABNORMAL HIGH
ANTI-DONOR HLA-DQB #1 MFI: 0 MFI
ANTI-DONOR HLA-DQB #2 MFI: 0 MFI
ANTI-DONOR HLA-DR #1 MFI: 50 MFI
ANTI-DONOR HLA-DR #2 MFI: 0 MFI

## 2020-05-23 LAB — FSAB CLASS 1 ANTIBODY SPECIFICITY: HLA CLASS 1 ANTIBODY RESULT: POSITIVE

## 2020-05-23 LAB — FSAB CLASS 2 ANTIBODY SPECIFICITY
CLASS 2 ANTIBODIES IDENTIFIED: 1:1 {titer}
HLA CL2 AB RESULT: POSITIVE

## 2020-05-23 NOTE — Unmapped (Signed)
Patient tacrolimus level 11.7 is not real level. Will follow up on repeat.

## 2020-05-27 LAB — RENAL FUNCTION PANEL
ALBUMIN: 4.1 g/dL (ref 3.8–4.8)
BLOOD UREA NITROGEN: 17 mg/dL (ref 6–20)
BUN / CREAT RATIO: 10 (ref 9–23)
CALCIUM: 9.6 mg/dL (ref 8.7–10.2)
CHLORIDE: 110 mmol/L — ABNORMAL HIGH (ref 96–106)
CO2: 17 mmol/L — ABNORMAL LOW (ref 20–29)
CREATININE: 1.62 mg/dL — ABNORMAL HIGH (ref 0.57–1.00)
EGFR: 43 mL/min/{1.73_m2} — ABNORMAL LOW
GLUCOSE: 105 mg/dL — ABNORMAL HIGH (ref 65–99)
PHOSPHORUS, SERUM: 3.1 mg/dL (ref 3.0–4.3)
POTASSIUM: 4.8 mmol/L (ref 3.5–5.2)
SODIUM: 144 mmol/L (ref 134–144)

## 2020-05-27 LAB — AMYLASE: AMYLASE: 152 U/L — ABNORMAL HIGH (ref 31–110)

## 2020-05-27 LAB — MAGNESIUM: MAGNESIUM: 1.6 mg/dL (ref 1.6–2.3)

## 2020-05-29 DIAGNOSIS — Z940 Kidney transplant status: Principal | ICD-10-CM

## 2020-05-29 DIAGNOSIS — D849 Immunodeficiency, unspecified: Principal | ICD-10-CM

## 2020-05-29 LAB — TACROLIMUS LEVEL: TACROLIMUS BLOOD: 6 ng/mL (ref 2.0–20.0)

## 2020-06-03 LAB — MAGNESIUM: MAGNESIUM: 1.5 mg/dL — ABNORMAL LOW (ref 1.6–2.3)

## 2020-06-03 LAB — CBC W/ DIFFERENTIAL
BANDED NEUTROPHILS ABSOLUTE COUNT: 0 10*3/uL (ref 0.0–0.1)
BASOPHILS ABSOLUTE COUNT: 0 10*3/uL (ref 0.0–0.2)
BASOPHILS RELATIVE PERCENT: 1 %
EOSINOPHILS ABSOLUTE COUNT: 0.2 10*3/uL (ref 0.0–0.4)
EOSINOPHILS RELATIVE PERCENT: 3 %
HEMATOCRIT: 32.3 % — ABNORMAL LOW (ref 34.0–46.6)
HEMOGLOBIN: 10.9 g/dL — ABNORMAL LOW (ref 11.1–15.9)
IMMATURE GRANULOCYTES: 0 %
LYMPHOCYTES ABSOLUTE COUNT: 1 10*3/uL (ref 0.7–3.1)
LYMPHOCYTES RELATIVE PERCENT: 16 %
MEAN CORPUSCULAR HEMOGLOBIN CONC: 33.7 g/dL (ref 31.5–35.7)
MEAN CORPUSCULAR HEMOGLOBIN: 31.5 pg (ref 26.6–33.0)
MEAN CORPUSCULAR VOLUME: 93 fL (ref 79–97)
MONOCYTES ABSOLUTE COUNT: 0.5 10*3/uL (ref 0.1–0.9)
MONOCYTES RELATIVE PERCENT: 8 %
NEUTROPHILS ABSOLUTE COUNT: 4.8 10*3/uL (ref 1.4–7.0)
NEUTROPHILS RELATIVE PERCENT: 72 %
PLATELET COUNT: 286 10*3/uL (ref 150–450)
RED BLOOD CELL COUNT: 3.46 x10E6/uL — ABNORMAL LOW (ref 3.77–5.28)
RED CELL DISTRIBUTION WIDTH: 13.1 % (ref 11.7–15.4)
WHITE BLOOD CELL COUNT: 6.6 10*3/uL (ref 3.4–10.8)

## 2020-06-03 LAB — BASIC METABOLIC PANEL
BLOOD UREA NITROGEN: 23 mg/dL — ABNORMAL HIGH (ref 6–20)
BUN / CREAT RATIO: 15 (ref 9–23)
CALCIUM: 9.8 mg/dL (ref 8.7–10.2)
CHLORIDE: 107 mmol/L — ABNORMAL HIGH (ref 96–106)
CO2: 20 mmol/L (ref 20–29)
CREATININE: 1.5 mg/dL — ABNORMAL HIGH (ref 0.57–1.00)
GLUCOSE: 90 mg/dL (ref 65–99)
POTASSIUM: 4.9 mmol/L (ref 3.5–5.2)
SODIUM: 141 mmol/L (ref 134–144)

## 2020-06-03 LAB — PHOSPHORUS: PHOSPHORUS, SERUM: 3.2 mg/dL (ref 3.0–4.3)

## 2020-06-05 LAB — TACROLIMUS LEVEL: TACROLIMUS BLOOD: 10.9 ng/mL (ref 2.0–20.0)

## 2020-06-06 NOTE — Unmapped (Signed)
Patient state tacrolimus level 10.9 is not real level. Will repeat labs at appt tomorrow

## 2020-06-07 ENCOUNTER — Ambulatory Visit: Admit: 2020-06-07 | Discharge: 2020-06-08 | Payer: MEDICARE | Attending: Nephrology | Primary: Nephrology

## 2020-06-07 ENCOUNTER — Ambulatory Visit: Admit: 2020-06-07 | Discharge: 2020-06-08 | Payer: MEDICARE

## 2020-06-07 DIAGNOSIS — Z940 Kidney transplant status: Principal | ICD-10-CM

## 2020-06-07 DIAGNOSIS — Z9483 Pancreas transplant status: Principal | ICD-10-CM

## 2020-06-07 DIAGNOSIS — Z79899 Other long term (current) drug therapy: Principal | ICD-10-CM

## 2020-06-07 LAB — URINALYSIS
BACTERIA: NONE SEEN /HPF
BILIRUBIN UA: NEGATIVE
BLOOD UA: NEGATIVE
GLUCOSE UA: NEGATIVE
KETONES UA: NEGATIVE
LEUKOCYTE ESTERASE UA: NEGATIVE
NITRITE UA: NEGATIVE
PH UA: 6 (ref 5.0–9.0)
PROTEIN UA: NEGATIVE
RBC UA: <1 /HPF (ref <4)
SPECIFIC GRAVITY UA: 1.015 (ref 1.005–1.030)
SQUAMOUS EPITHELIAL: 6 /HPF — ABNORMAL HIGH (ref 0–5)
UROBILINOGEN UA: 0.2
WBC UA: <1 /HPF (ref 0–5)

## 2020-06-07 LAB — CBC W/ AUTO DIFF
BASOPHILS ABSOLUTE COUNT: 0 10*9/L (ref 0.0–0.1)
BASOPHILS RELATIVE PERCENT: 0.6 %
EOSINOPHILS ABSOLUTE COUNT: 0.2 10*9/L (ref 0.0–0.5)
EOSINOPHILS RELATIVE PERCENT: 3.9 %
HEMATOCRIT: 31 % — ABNORMAL LOW (ref 34.0–44.0)
HEMOGLOBIN: 10.7 g/dL — ABNORMAL LOW (ref 11.3–14.9)
LYMPHOCYTES ABSOLUTE COUNT: 0.9 10*9/L — ABNORMAL LOW (ref 1.1–3.6)
LYMPHOCYTES RELATIVE PERCENT: 18.7 %
MEAN CORPUSCULAR HEMOGLOBIN CONC: 34.5 g/dL (ref 32.0–36.0)
MEAN CORPUSCULAR HEMOGLOBIN: 31.3 pg (ref 25.9–32.4)
MEAN CORPUSCULAR VOLUME: 90.8 fL (ref 77.6–95.7)
MEAN PLATELET VOLUME: 8 fL (ref 6.8–10.7)
MONOCYTES ABSOLUTE COUNT: 0.3 10*9/L (ref 0.3–0.8)
MONOCYTES RELATIVE PERCENT: 7.1 %
NEUTROPHILS ABSOLUTE COUNT: 3.3 10*9/L (ref 1.8–7.8)
NEUTROPHILS RELATIVE PERCENT: 69.7 %
PLATELET COUNT: 259 10*9/L (ref 150–450)
RED BLOOD CELL COUNT: 3.42 10*12/L — ABNORMAL LOW (ref 3.95–5.13)
RED CELL DISTRIBUTION WIDTH: 13.3 % (ref 12.2–15.2)
WBC ADJUSTED: 4.8 10*9/L (ref 3.6–11.2)

## 2020-06-07 LAB — COMPREHENSIVE METABOLIC PANEL
ALBUMIN: 3.6 g/dL (ref 3.4–5.0)
ALKALINE PHOSPHATASE: 74 U/L (ref 46–116)
ALT (SGPT): 37 U/L (ref 10–49)
ANION GAP: 4 mmol/L — ABNORMAL LOW (ref 5–14)
AST (SGOT): 29 U/L (ref <=34)
BILIRUBIN TOTAL: 0.6 mg/dL (ref 0.3–1.2)
BLOOD UREA NITROGEN: 24 mg/dL — ABNORMAL HIGH (ref 9–23)
BUN / CREAT RATIO: 16
CALCIUM: 9.5 mg/dL (ref 8.7–10.4)
CHLORIDE: 112 mmol/L — ABNORMAL HIGH (ref 98–107)
CO2: 24 mmol/L (ref 20.0–31.0)
CREATININE: 1.49 mg/dL — ABNORMAL HIGH
EGFR CKD-EPI AA FEMALE: 53 mL/min/{1.73_m2} — ABNORMAL LOW (ref >=60)
EGFR CKD-EPI NON-AA FEMALE: 46 mL/min/{1.73_m2} — ABNORMAL LOW (ref >=60)
GLUCOSE RANDOM: 89 mg/dL (ref 70–99)
POTASSIUM: 4.5 mmol/L (ref 3.4–4.8)
PROTEIN TOTAL: 7.1 g/dL (ref 5.7–8.2)
SODIUM: 140 mmol/L (ref 135–145)

## 2020-06-07 LAB — PROTEIN / CREATININE RATIO, URINE
CREATININE, URINE: 98.5 mg/dL
PROTEIN URINE: 11.1 mg/dL
PROTEIN/CREAT RATIO, URINE: 0.113

## 2020-06-07 LAB — LIPASE: LIPASE: 27 U/L (ref 12–53)

## 2020-06-07 LAB — PHOSPHORUS: PHOSPHORUS: 3.3 mg/dL (ref 2.4–5.1)

## 2020-06-07 LAB — MAGNESIUM: MAGNESIUM: 1.4 mg/dL — ABNORMAL LOW (ref 1.6–2.6)

## 2020-06-07 LAB — AMYLASE: AMYLASE: 141 U/L — ABNORMAL HIGH (ref 30–118)

## 2020-06-07 NOTE — Unmapped (Signed)
[facility] Refill Coordination Note    Specialty Medication(s) to be Shipped:   Transplant: Envarsus 1mg , Envarsus 4mg  and mycophenolate mofetil 180mg     Other medication(s) to be shipped: aspirin, dificid and sodium bicarb     Elaine Koch, DOB: [birth year]  Phone: [phone] (home)       All above HIPAA information was verified with patient.     Was a Nurse, learning disability used for this call? No    Completed refill call assessment today to schedule patient's medication shipment from the [facility] [phone]).  All relevant notes have been reviewed.     Specialty medication(s) and dose(s) confirmed: Regimen is correct and unchanged.   Changes to medications: Liv reports no changes at this time.  Changes to insurance: No  New side effects reported not previously addressed with a pharmacist or physician: None reported  Questions for the pharmacist: No    Confirmed patient received a Conservation officer, historic buildings and a Surveyor, mining with first shipment. The patient will receive a drug information handout for each medication shipped and additional FDA Medication Guides as required.       DISEASE/MEDICATION-SPECIFIC INFORMATION        N/A    SPECIALTY MEDICATION ADHERENCE     Medication Adherence    Patient reported X missed doses in the last month: 0  Specialty Medication: Envarsus 1mg   Patient is on additional specialty medications: Yes  Additional Specialty Medications: Envarsus 4mg   Patient Reported Additional Medication X Missed Doses in the Last Month: 0  Patient is on more than two specialty medications: Yes  Specialty Medication: Mycophenolate 180mg   Patient Reported Additional Medication X Missed Doses in the Last Month: 0        Were doses missed due to medication being on hold? No    Envarsus 1 mg: 10 days of medicine on hand   Envarsus 4 mg: 10 days of medicine on hand   Mycophenolate 180 mg: 10 days of medicine on hand     REFERRAL TO PHARMACIST     Referral to the pharmacist: Not needed      [facility]     Shipping address confirmed in Epic.     Delivery Scheduled: Yes, Expected medication delivery date: 06/16/2020.     Medication will be delivered via UPS to the prescription address in Epic WAM.    [facility]orelei Pont [facility] Specialty Technician

## 2020-06-07 NOTE — Unmapped (Addendum)
Transplant Nephrology Clinic Visit      History of Present Illness    Elaine Koch is a 35 y.o. 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 Her most recent baseline creatinine is between 1.3-1.6, occasionally up to 1.7.    She was on hemodialysis for about 3 years prior to transplant through an AVF. Her diabetes has been complicated by retinopathy. She had pre-eclampsia during pregnancy in 2017. She also had severe anemia requiring multiple blood transfusions during pregnancy and for some time after. In spite of that she had a cPRA of 0% prior to transplant.    She was admitted from 3/21 through 05/11/19 for the initial transplant. Her course was fairly uncomplicated. On POD 8 did have a transient bump in her creatinine (1.39 to 1.57) and amylase (76 to 112), came down to 1.52 and 89 with fluids. [place] of both kidney and pancreas showed patent vessels and normal RIs. After discharge, she had some sugars in the 120s. On 4/2 reported BP 88/58 so amlodipine was held. At surgery visit 4/8 reported loose stools, which she had pre-transplant. Reglan was decreased. BP 80s/40s so all antihypertensives were held. At visit 4/15 pressures remained low so midodrine 5 mg daily was started, reglan further decreased. Her amylase was 467 at that visit so prednisone 5 mg was started since tacrolimus was sub-therapeutic, with plans to taper off once tac stable and pancreatic enzymes normal. At nephrology visit 4/20, amylase down to 179 and tacrolimus level better on TID dosing. Ureteral stent removed 06/10/19. At visit 5/25 planned transition to Envarsus given high tacrolimus requirement. Amylase remained elevated but stable at 149. Blood pressure improved and was no longer hypotensive.     She called the on-call coordinator on 07/11/19 complaining of headache, nausea and vomiting, diarrhea the night prior. No fever. Was able to keep morning meds down. Recommended bland diet, push fluids. On 6/1 called to report BP 143/110 with chest pain, headache and vomiting and was referred to ED. She was admitted from 6/1 through 07/16/19. On admission she was noted to have a creatinine of 3.22 (from 1.19 on 5/28) in the setting of a tacrolimus level of 27.9. Tacrolimus was held and level came down. She was hypotensive and given IVF. She was found to be C.diff positive and started on vancomycin with plans for 10 day course. Creatinine to 1.32 on discharge. Wbc 0.8 on discharge with ANC 0.5. CMV negative on 6/1. Was discharged on Myfortic 720 mg BID.    On 07/20/19 she was noted to have a wbc < 0.6 and ANC < 0.2. She reported no symptoms and was instructed to hold Myfortic and Valcyte on 6/10 and received a dose of Granix. On 6/10 wbc to 0.6 with ANC 0.3. On 6/16 she reported diarrhea returned after stopping vancomycin, course was extended for an additional 14 days. Wbc improved to 3.9 on 6/14 and then 6.1 on 6/17 so Myfortic restarted at 180 mg BID. Creatinine to 1.56 on 6/17 and 1.49 on 6/21, reported had not been drinking as much since back at work. Wbc 8.6 on 6/21 with ANC 7.5. Most recent lipase has been 90-122.    She followed up with ID on 08/31/19, who placed a referral to Dr. Fara Boros with GI for possible FMT. Patient finished fidaxomicin on 09/06/19 and restarted Vancomycin. When this occurred,  patient's diarrhea returned. Patient's vancomycin stopped and she restarted Fidaxomicin on 09/13/19. She completed her Bezlotoxumab on 09/17/19.     She was admitted from 8/20 through 10/03/19 after presenting with LLQ pain, myalgias, headaches, and low grade fevers. Urine grew 50-100K Enterococcus, no other source found, CT abd/pelvis, TTE and renal US all unremarkable. Treated with 10 day course of linezolid.    She had a virtual visit with ID on 11/30/19 for diarrhea.  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 Per note She can continue the Vanco 125mg  4x daily until she is able to start fidaxomicin 200mg  bid for 10 days then decrease to 200mg  daily for secondary ppx.  Patient saw GI on 12/10/19 with a plan for fecal transplant 02/14/2020.  She followed up with ID on 12/24/19 who stated to Decrease Dificid tp 200mg  once daily for secondary ppx until Dec 31 (three days prior to FMT) and to Decrease Valcyte 900mg  qhs for secondary ppx until follow up in early Feb.     She underwent fecal transplant on 02/14/20. On 02/17/20 she called her coordinator and reported feeling poorly since the procedure, with cramping in her lower back, N/V, dysuria and frequency. Denied fever. She had been given cipro by her OB/GYN which we asked her not to take given recent fecal transplant, referred her to ED. She was admitted from 1/6 through 02/22/20. She had creatinine up to 2.34, down to 1.69 on discharge with IVF. Her tacrolimus level was up to 13.5 on 02/21/20, so tac dose was reduced. She grew proteus in her urine and blood, treated with IV cefipime and then transitioned to oral cipro through 02/28/20. She was also found to be COVID positive on admission and received remdesivir. Myfortic and Valcyte were held on discharge due to leukopenia, CMV was not detected.    Myfortic restarted at 180 mg BID on 03/03/20.     She had a telemedicine visit with [facility] on 03/10/20, patient reported recurrence of diarrhea similar to previous C.diff on 03/06/20. Restarted vancomycin BID which helped but did not resolve. Recommended increasing vanc to QID until she could get fidaxomicin filled, planned for 10 day course followed by vanc 1-2 times per day for maintenance. Also recommended 1g D-mannose after intercourse and 12 hours later to see if it helps with UTIs. Anticipated that she will require repeat FMT.    Interval since last visit 03/23/2020    At her visit 03/23/20 she had not yet gotten the fidaxomicin and continued to have diarrhea. She followed up with [facility] 04/11/20 who encouraged her to fill the fidaxomicin as historically she had done better on it. Per the note I will continue to discuss risks/benefits of repeat FMT with Dr. Fara Boros. I wonder if FMT via colonoscopy is the best approach for her. While this is the most effective method, I wonder of the bowel cleanse sent her up for the urosepsis and if she would do better with another type of FMT administration. At visit 05/09/20 she was taking fidaxomicin and had follow up with GI on 05/12/20. At that visit they discussed repeat FMT via upper vs. lower delivery and Dr. Fara Boros thought lower delivery was still the best approach.    She presents today for follow up.    Her diarrhea had been better, would have two loose stools per day. Over the last 2 days the frequency increased to going throughout the day, up to 6 times. Has continued fidaxomicin. Her repeat FMT is scheduled  for 06/19/20. No abdominal pain or fever.    She is currently using condoms for birth control, is open to IUD/depot/Nexplanon.    She specifically denies fever, myalgias, upper respiratory/GI symptoms, or change in taste/smell.    Last dose of Envarsus: 9:30 am    Review of Systems    Otherwise on review of systems patient denies fever or chills, chest pain, SOB, PND or orthopnea, lower extremity edema. Denies N/V/abdominal pain. No dysuria, hematuria or difficulty voiding. Bowel movements without blood. Denies joint pain or rash. All other systems are reviewed and are negative.    Medications    Current Outpatient Medications   Medication Sig Dispense Refill   ??? aspirin (ECOTRIN) 81 MG tablet Take 1 tablet (81 mg total) by mouth daily. 30 tablet 11   ??? fidaxomicin (DIFICID) 200 mg tablet Take 1 tablet (200 mg total) by mouth every twelve (12) hours. 60 tablet 3   ??? mycophenolate (MYFORTIC) 180 MG EC tablet Take 1 tablet (180 mg total) by mouth Two (2) times a day. 60 tablet 11   ??? sodium bicarbonate 650 mg tablet Take 2 tablets (1,300 mg total) by mouth Three (3) times a day. 180 tablet 3   ??? tacrolimus (ENVARSUS XR) 1 mg Tb24 extended release tablet Take 2 (1 mg) tablets by mouth daily with 4 (4mg ) tablets for total daily dose of 18mg  60 tablet 5   ??? tacrolimus (ENVARSUS XR) 4 mg Tb24 extended release tablet Take 4 (4 mg) tablets by mouth daily with 2 (1mg ) tablets for total daily dose of 18mg . 120 tablet 11   ??? amLODIPine (NORVASC) 5 MG tablet Take 1 tablet (5 mg total) by mouth daily. (Patient not taking: Reported on 06/07/2020) 90 tablet 3   ??? cholecalciferol, vitamin D3-125 mcg, 5,000 unit,, 125 mcg (5,000 unit) capsule Take 1 capsule (125 mcg total) by mouth daily. (Patient not taking: Reported on 06/07/2020) 100 capsule 10     No current facility-administered medications for this visit.       Physical Exam    BP 136/81 (BP Site: L Arm, BP Position: Sitting, BP Cuff Size: Medium)  - Pulse 77  - Temp 36.1 ??C (97 ??F) (Temporal)  - Wt 84.5 kg (186 lb 3.2 oz)  - BMI 30.99 kg/m??   General: Patient is a pleasant female in no apparent distress.  Eyes: Sclera anicteric.  ENT: Mask in place.  Neck: Supple without LAD/JVD/bruits.  Lungs: Clear to auscultation bilaterally, no wheezes/rales/rhonchi.  Cardiovascular: Regular rate and rhythm without murmurs, rubs or gallops.  Abdomen: Soft, notender/nondistended. Positive bowel sounds. No tenderness over the graft.  Extremities: Without edema, joints without evidence of synovitis.  Skin: Without rash.  Neurological: Grossly nonfocal.  Psychiatric: Mood and affect appropriate.    Laboratory Results    Results for orders placed or performed in visit on 06/07/20   Urine Culture    Specimen: Clean Catch; Urine   Result Value Ref Range    Urine Culture, Comprehensive Mixed Urogenital Flora    Urinalysis   Result Value Ref Range    Color, UA Yellow     Clarity, UA Clear     Specific Gravity, UA 1.015 1.005 - 1.030    pH, UA 6.0 5.0 - 9.0    Leukocyte Esterase, UA Negative Negative    Nitrite, UA Negative Negative    Protein, UA Negative Negative    Glucose, UA Negative Negative    Ketones, UA Negative Negative    Urobilinogen,  UA 0.2 mg/dL 0.2 - 2.0 mg/dL    Bilirubin, UA Negative Negative    Blood, UA Negative Negative    RBC, UA <1 <4 /HPF    WBC, UA <1 0 - 5 /HPF    Squam Epithel, UA 6 (H) 0 - 5 /HPF    Bacteria, UA None Seen None Seen /HPF    Amorphous Crystal, UA Occasional /HPF   Protein/Creatinine Ratio, Urine   Result Value Ref Range    Creat U 98.5 Undefined mg/dL    Protein, Ur 16.1 mg/dL    Protein/Creatinine Ratio, Urine 0.113 Undefined   Cytology - Urine   Result Value Ref Range    Diagnosis       Urine for decoy cells, voided  - No malignant cells identified  - No decoy cells identified  - Predominantly squamous cells    This electronic signature is attestation that the pathologist personally reviewed the submitted material(s) and the final diagnosis reflects that evaluation.      Gross Description            Urine clean catch voided decoy    60 mL, clear, yellow, unfixed    Materials Prepared & Examined    Smear Slides.......0  Monolayers..........1  Cytospins.............0  Cell Blocks...........0  Core Biopsy.........0  Touch Prep..........0          Microscopic Description       Microscopic examination substantiates the above diagnosis.    Resident Physician: None Assigned      EMBEDDED IMAGES      Specimen Adequacy Satisfactory for evaluation     Disclaimer       Unless otherwise specified, specimens are preserved using 10% neutral buffered formalin. For cases in which immunohistochemical and/or in-situ hybridization stains are performed, the following statement applies: Appropriate controls for each stain (positive controls with or without negative controls) have been evaluated and stain as expected. These stains have not been separately validated for use on decalcified specimens and should be interpreted with caution in that setting. Some of the reagents used for these stains may be classified as analyte specific reagents (ASR). Tests using ASRs were developed, and their performance characteristics were determined, by the Anatomic Pathology Department [facility]). They have not been cleared or approved by the [place] Food and Drug Administration (FDA). The FDA does not require these tests to go through premarket FDA review. These tests are used for clinical purposes. They should not be regarded as investigational or for research. This laboratory is certified under the Clinical Laboratory Improvement Amendments (CLIA) as qualified to perform high complexity clinical laboratory testing.       Lab Results   Component Value Date    WBC 4.8 06/07/2020    RBC 3.42 (L) 06/07/2020    HGB 10.7 (L) 06/07/2020    HCT 31.0 (L) 06/07/2020    MCV 90.8 06/07/2020    MCH 31.3 06/07/2020    MCHC 34.5 06/07/2020    RDW 13.3 06/07/2020    PLT 259 06/07/2020     Lab Results   Component Value Date    NA 140 06/07/2020    K 4.5 06/07/2020    CL 112 (H) 06/07/2020    CO2 24.0 06/07/2020    BUN 24 (H) 06/07/2020    CREATININE 1.49 (H) 06/07/2020    GLU 89 06/07/2020    CALCIUM 9.5 06/07/2020    ALBUMIN 3.6 06/07/2020    PHOS 3.3 06/07/2020     Lab Results  Component Value Date    ALKPHOS 74 06/07/2020    BILITOT 0.6 06/07/2020    BILIDIR 0.20 02/20/2020    PROT 7.1 06/07/2020    ALBUMIN 3.6 06/07/2020    ALT 37 06/07/2020    AST 29 06/07/2020         Assessment and Plan    1. Status post renal transplant. Her creatinine is 1.49 and within her most recent baseline. Envarsus level of 9.5 is a 25.5 hour trough and at goal of 8-10 (due to pancreas transplant). Will continue current immunosuppression.    2. Status post pancreas transplant. She has had a mildly elevated amylase since transplant, 141 today with normal lipase. Peaked at 467 on 05/27/19. Hgb A1c reassuring at 4.3% on 10/13/19.    3. C.diff. Diarrhea unfortunately has recurred following fecal transplant, scheduled for repeat FMT 06/19/20. Continue fidoxomicin in the meantime.    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 Unfortunately new DSA to DR51 present today as well, at MFI 1330. Suspect this is due to lower dose of Myfortic in the setting of C.diff. If FMT successful 06/19/20 can hopefully push Myfortic dose.    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 CMV PCR remains negative, last checked on 04/14/20. On reduced Myfortic at 180 mg BID.     6. Hypertension. Had started amlodipine 5 mg daily, currently on hold with BP in clinic today 136/81.    7. Childbearing age. She is aware that Myfortic is teratogenic. Unable to get IUD locally. Has been referred to [facility] for [facility]UD.     8. Health maintenance. She received a flu shot 11/26/19. She has had a Pnemovax vaccine, needs Prevnar-20. Received her third dose of the Pfizer Covid vaccine 03/23/20, fourth dose due 06/20/20.     We discussed that while we continue to recommend the COVID vaccine for all eligible patients, we know that kidney transplant patients do not respond as strongly due to their immunosuppression, so she should continue to follow the CDC guidelines for patients who are not vaccinated. I would also encourage all household members and close contacts that are eligible for vaccination to get vaccinated. We discussed the importance of ongoing social distancing, wearing a mask outside the home, and hand washing/sanitizing.     9. Will see patient back in 3 months, or sooner if needed.

## 2020-06-08 LAB — TACROLIMUS LEVEL, TROUGH: TACROLIMUS, TROUGH: 9.5 ng/mL (ref 5.0–15.0)

## 2020-06-12 DIAGNOSIS — Z940 Kidney transplant status: Principal | ICD-10-CM

## 2020-06-12 DIAGNOSIS — D849 Immunodeficiency, unspecified: Principal | ICD-10-CM

## 2020-06-14 LAB — HLA DS POST TRANSPLANT
ANTI-DONOR DRW #2 MFI: 1330 MFI — ABNORMAL HIGH
ANTI-DONOR HLA-A #1 MFI: 42 MFI
ANTI-DONOR HLA-A #2 MFI: 28 MFI
ANTI-DONOR HLA-B #1 MFI: 1 MFI
ANTI-DONOR HLA-B #2 MFI: 72 MFI
ANTI-DONOR HLA-C #1 MFI: 367 MFI
ANTI-DONOR HLA-C #2 MFI: 1066 MFI — ABNORMAL HIGH
ANTI-DONOR HLA-DQB #1 MFI: 68 MFI
ANTI-DONOR HLA-DQB #2 MFI: 315 MFI
ANTI-DONOR HLA-DR #1 MFI: 391 MFI
ANTI-DONOR HLA-DR #2 MFI: 308 MFI

## 2020-06-14 LAB — FSAB CLASS 1 ANTIBODY SPECIFICITY: HLA CLASS 1 ANTIBODY RESULT: POSITIVE

## 2020-06-14 LAB — FSAB CLASS 2 ANTIBODY SPECIFICITY: HLA CL2 AB RESULT: POSITIVE

## 2020-06-15 MED FILL — MYCOPHENOLATE SODIUM 180 MG TABLET,DELAYED RELEASE: ORAL | 30 days supply | Qty: 60 | Fill #2

## 2020-06-15 MED FILL — ENVARSUS XR 4 MG TABLET,EXTENDED RELEASE: ORAL | 30 days supply | Qty: 120 | Fill #2

## 2020-06-15 MED FILL — ASPIRIN 81 MG TABLET,DELAYED RELEASE: ORAL | 30 days supply | Qty: 30 | Fill #1

## 2020-06-15 MED FILL — SODIUM BICARBONATE 650 MG TABLET: ORAL | 30 days supply | Qty: 180 | Fill #1

## 2020-06-15 MED FILL — ENVARSUS XR 1 MG TABLET,EXTENDED RELEASE: ORAL | 30 days supply | Qty: 60 | Fill #2

## 2020-06-15 MED FILL — DIFICID 200 MG TABLET: ORAL | 30 days supply | Qty: 60 | Fill #2

## 2020-06-19 ENCOUNTER — Encounter: Admit: 2020-06-19 | Discharge: 2020-06-19 | Payer: MEDICARE

## 2020-06-19 ENCOUNTER — Ambulatory Visit: Admit: 2020-06-19 | Discharge: 2020-06-19 | Payer: MEDICARE

## 2020-06-19 MED ADMIN — propofol (DIPRIVAN) infusion 10 mg/mL: INTRAVENOUS | @ 20:00:00 | Stop: 2020-06-19

## 2020-06-19 MED ADMIN — sodium chloride (NS) 0.9 % infusion: 10 mL/h | INTRAVENOUS | @ 19:00:00 | Stop: 2020-06-19

## 2020-06-19 MED ADMIN — propofoL (DIPRIVAN) injection: INTRAVENOUS | @ 20:00:00 | Stop: 2020-06-19

## 2020-06-19 NOTE — Unmapped (Signed)
Spoke to patient confirmed appointment for FMT today. Elaine Koch in GIP notified.

## 2020-06-26 DIAGNOSIS — D849 Immunodeficiency, unspecified: Principal | ICD-10-CM

## 2020-06-26 DIAGNOSIS — Z940 Kidney transplant status: Principal | ICD-10-CM

## 2020-07-07 NOTE — Unmapped (Signed)
[facility] Refill Coordination Note    Specialty Medication(s) to be Shipped:   Transplant: Envarsus 1mg , Envarsus 4mg  and mycophenolate mofetil 180mg     Other medication(s) to be shipped: sodium bicarb and aspirin     Elaine Koch, DOB: 1985/03/28  Phone: [phone] (home)       All above HIPAA information was verified with patient.     Was a Nurse, learning disability used for this call? No    Completed refill call assessment today to schedule patient's medication shipment from the [facility] [phone]).  All relevant notes have been reviewed.     Specialty medication(s) and dose(s) confirmed: Regimen is correct and unchanged.   Changes to medications: Elaine Koch reports no changes at this time.  Changes to insurance: No  New side effects reported not previously addressed with a pharmacist or physician: None reported  Questions for the pharmacist: No    Confirmed patient received a Conservation officer, historic buildings and a Surveyor, mining with first shipment. The patient will receive a drug information handout for each medication shipped and additional FDA Medication Guides as required.       DISEASE/MEDICATION-SPECIFIC INFORMATION        N/A    SPECIALTY MEDICATION ADHERENCE     Medication Adherence    Patient reported X missed doses in the last month: 0  Specialty Medication: Envarsus 1mg   Patient is on additional specialty medications: Yes  Additional Specialty Medications: Envarsus 4mg   Patient Reported Additional Medication X Missed Doses in the Last Month: 0  Patient is on more than two specialty medications: Yes  Specialty Medication: Mycophenolate 180mg   Patient Reported Additional Medication X Missed Doses in the Last Month: 0        Were doses missed due to medication being on hold? No    Envarsus 1 mg: 8 days of medicine on hand   Envarsus 4 mg: 8 days of medicine on hand   Mycophenolate 180 mg: 8 days of medicine on hand     REFERRAL TO PHARMACIST     Referral to the pharmacist: Not needed      [facility]     Shipping address confirmed in Epic.     Delivery Scheduled: Yes, Expected medication delivery date: 07/14/2020.     Medication will be delivered via UPS to the prescription address in Epic WAM.    Lorelei Pont [facility] Specialty Technician

## 2020-07-08 LAB — CBC W/ DIFFERENTIAL
BANDED NEUTROPHILS ABSOLUTE COUNT: 0 10*3/uL (ref 0.0–0.1)
BASOPHILS ABSOLUTE COUNT: 0 10*3/uL (ref 0.0–0.2)
BASOPHILS RELATIVE PERCENT: 1 %
EOSINOPHILS ABSOLUTE COUNT: 0.2 10*3/uL (ref 0.0–0.4)
EOSINOPHILS RELATIVE PERCENT: 3 %
HEMATOCRIT: 35.1 % (ref 34.0–46.6)
HEMOGLOBIN: 11.7 g/dL (ref 11.1–15.9)
IMMATURE GRANULOCYTES: 0 %
LYMPHOCYTES ABSOLUTE COUNT: 1 10*3/uL (ref 0.7–3.1)
LYMPHOCYTES RELATIVE PERCENT: 18 %
MEAN CORPUSCULAR HEMOGLOBIN CONC: 33.3 g/dL (ref 31.5–35.7)
MEAN CORPUSCULAR HEMOGLOBIN: 30.5 pg (ref 26.6–33.0)
MEAN CORPUSCULAR VOLUME: 92 fL (ref 79–97)
MONOCYTES ABSOLUTE COUNT: 0.4 10*3/uL (ref 0.1–0.9)
MONOCYTES RELATIVE PERCENT: 8 %
NEUTROPHILS ABSOLUTE COUNT: 3.9 10*3/uL (ref 1.4–7.0)
NEUTROPHILS RELATIVE PERCENT: 70 %
PLATELET COUNT: 268 10*3/uL (ref 150–450)
RED BLOOD CELL COUNT: 3.83 x10E6/uL (ref 3.77–5.28)
RED CELL DISTRIBUTION WIDTH: 13.2 % (ref 11.7–15.4)
WHITE BLOOD CELL COUNT: 5.5 10*3/uL (ref 3.4–10.8)

## 2020-07-08 LAB — MAGNESIUM: MAGNESIUM: 1.8 mg/dL (ref 1.6–2.3)

## 2020-07-08 LAB — BASIC METABOLIC PANEL
BLOOD UREA NITROGEN: 22 mg/dL — ABNORMAL HIGH (ref 6–20)
BUN / CREAT RATIO: 14 (ref 9–23)
CALCIUM: 9.6 mg/dL (ref 8.7–10.2)
CHLORIDE: 107 mmol/L — ABNORMAL HIGH (ref 96–106)
CO2: 21 mmol/L (ref 20–29)
CREATININE: 1.56 mg/dL — ABNORMAL HIGH (ref 0.57–1.00)
GLUCOSE: 93 mg/dL (ref 65–99)
POTASSIUM: 4.6 mmol/L (ref 3.5–5.2)
SODIUM: 143 mmol/L (ref 134–144)

## 2020-07-08 LAB — PHOSPHORUS: PHOSPHORUS, SERUM: 3.5 mg/dL (ref 3.0–4.3)

## 2020-07-10 DIAGNOSIS — D849 Immunodeficiency, unspecified: Principal | ICD-10-CM

## 2020-07-10 DIAGNOSIS — Z940 Kidney transplant status: Principal | ICD-10-CM

## 2020-07-10 LAB — TACROLIMUS LEVEL: TACROLIMUS BLOOD: 6.7 ng/mL (ref 2.0–20.0)

## 2020-07-13 MED FILL — ENVARSUS XR 1 MG TABLET,EXTENDED RELEASE: ORAL | 30 days supply | Qty: 60 | Fill #3

## 2020-07-13 MED FILL — ASPIRIN 81 MG TABLET,DELAYED RELEASE: ORAL | 30 days supply | Qty: 30 | Fill #2

## 2020-07-13 MED FILL — MYCOPHENOLATE SODIUM 180 MG TABLET,DELAYED RELEASE: ORAL | 30 days supply | Qty: 60 | Fill #3

## 2020-07-13 MED FILL — SODIUM BICARBONATE 650 MG TABLET: ORAL | 30 days supply | Qty: 180 | Fill #2

## 2020-07-13 MED FILL — ENVARSUS XR 4 MG TABLET,EXTENDED RELEASE: ORAL | 30 days supply | Qty: 120 | Fill #3

## 2020-07-22 LAB — BASIC METABOLIC PANEL
BLOOD UREA NITROGEN: 28 mg/dL — ABNORMAL HIGH (ref 6–20)
BUN / CREAT RATIO: 18 (ref 9–23)
CALCIUM: 9.9 mg/dL (ref 8.7–10.2)
CHLORIDE: 109 mmol/L — ABNORMAL HIGH (ref 96–106)
CO2: 19 mmol/L — ABNORMAL LOW (ref 20–29)
CREATININE: 1.52 mg/dL — ABNORMAL HIGH (ref 0.57–1.00)
GLUCOSE: 87 mg/dL (ref 65–99)
POTASSIUM: 4.8 mmol/L (ref 3.5–5.2)
SODIUM: 142 mmol/L (ref 134–144)

## 2020-07-22 LAB — CBC W/ DIFFERENTIAL
BANDED NEUTROPHILS ABSOLUTE COUNT: 0 10*3/uL (ref 0.0–0.1)
BASOPHILS ABSOLUTE COUNT: 0 10*3/uL (ref 0.0–0.2)
BASOPHILS RELATIVE PERCENT: 1 %
EOSINOPHILS ABSOLUTE COUNT: 0.2 10*3/uL (ref 0.0–0.4)
EOSINOPHILS RELATIVE PERCENT: 3 %
HEMATOCRIT: 37 % (ref 34.0–46.6)
HEMOGLOBIN: 11.9 g/dL (ref 11.1–15.9)
IMMATURE GRANULOCYTES: 0 %
LYMPHOCYTES ABSOLUTE COUNT: 1.1 10*3/uL (ref 0.7–3.1)
LYMPHOCYTES RELATIVE PERCENT: 18 %
MEAN CORPUSCULAR HEMOGLOBIN CONC: 32.2 g/dL (ref 31.5–35.7)
MEAN CORPUSCULAR HEMOGLOBIN: 30.1 pg (ref 26.6–33.0)
MEAN CORPUSCULAR VOLUME: 93 fL (ref 79–97)
MONOCYTES ABSOLUTE COUNT: 0.5 10*3/uL (ref 0.1–0.9)
MONOCYTES RELATIVE PERCENT: 8 %
NEUTROPHILS ABSOLUTE COUNT: 4.4 10*3/uL (ref 1.4–7.0)
NEUTROPHILS RELATIVE PERCENT: 70 %
PLATELET COUNT: 255 10*3/uL (ref 150–450)
RED BLOOD CELL COUNT: 3.96 x10E6/uL (ref 3.77–5.28)
RED CELL DISTRIBUTION WIDTH: 13.1 % (ref 11.7–15.4)
WHITE BLOOD CELL COUNT: 6.2 10*3/uL (ref 3.4–10.8)

## 2020-07-22 LAB — PHOSPHORUS: PHOSPHORUS, SERUM: 3.3 mg/dL (ref 3.0–4.3)

## 2020-07-22 LAB — MAGNESIUM: MAGNESIUM: 1.5 mg/dL — ABNORMAL LOW (ref 1.6–2.3)

## 2020-07-24 DIAGNOSIS — D849 Immunodeficiency, unspecified: Principal | ICD-10-CM

## 2020-07-24 DIAGNOSIS — Z940 Kidney transplant status: Principal | ICD-10-CM

## 2020-07-25 LAB — TACROLIMUS LEVEL: TACROLIMUS BLOOD: 8 ng/mL (ref 2.0–20.0)

## 2020-08-05 LAB — CBC W/ DIFFERENTIAL
BANDED NEUTROPHILS ABSOLUTE COUNT: 0 10*3/uL (ref 0.0–0.1)
BASOPHILS ABSOLUTE COUNT: 0 10*3/uL (ref 0.0–0.2)
BASOPHILS RELATIVE PERCENT: 1 %
EOSINOPHILS ABSOLUTE COUNT: 0.2 10*3/uL (ref 0.0–0.4)
EOSINOPHILS RELATIVE PERCENT: 4 %
HEMATOCRIT: 34.6 % (ref 34.0–46.6)
HEMOGLOBIN: 11.6 g/dL (ref 11.1–15.9)
IMMATURE GRANULOCYTES: 0 %
LYMPHOCYTES ABSOLUTE COUNT: 0.9 10*3/uL (ref 0.7–3.1)
LYMPHOCYTES RELATIVE PERCENT: 17 %
MEAN CORPUSCULAR HEMOGLOBIN CONC: 33.5 g/dL (ref 31.5–35.7)
MEAN CORPUSCULAR HEMOGLOBIN: 30.6 pg (ref 26.6–33.0)
MEAN CORPUSCULAR VOLUME: 91 fL (ref 79–97)
MONOCYTES ABSOLUTE COUNT: 0.4 10*3/uL (ref 0.1–0.9)
MONOCYTES RELATIVE PERCENT: 7 %
NEUTROPHILS ABSOLUTE COUNT: 4 10*3/uL (ref 1.4–7.0)
NEUTROPHILS RELATIVE PERCENT: 71 %
PLATELET COUNT: 298 10*3/uL (ref 150–450)
RED BLOOD CELL COUNT: 3.79 x10E6/uL (ref 3.77–5.28)
RED CELL DISTRIBUTION WIDTH: 13 % (ref 11.7–15.4)
WHITE BLOOD CELL COUNT: 5.6 10*3/uL (ref 3.4–10.8)

## 2020-08-05 LAB — BASIC METABOLIC PANEL
BLOOD UREA NITROGEN: 19 mg/dL (ref 6–20)
BUN / CREAT RATIO: 12 (ref 9–23)
CALCIUM: 9.4 mg/dL (ref 8.7–10.2)
CHLORIDE: 108 mmol/L — ABNORMAL HIGH (ref 96–106)
CO2: 20 mmol/L (ref 20–29)
CREATININE: 1.61 mg/dL — ABNORMAL HIGH (ref 0.57–1.00)
GLUCOSE: 87 mg/dL (ref 65–99)
POTASSIUM: 4.6 mmol/L (ref 3.5–5.2)
SODIUM: 141 mmol/L (ref 134–144)

## 2020-08-05 LAB — MAGNESIUM: MAGNESIUM: 1.5 mg/dL — ABNORMAL LOW (ref 1.6–2.3)

## 2020-08-05 LAB — PHOSPHORUS: PHOSPHORUS, SERUM: 3.3 mg/dL (ref 3.0–4.3)

## 2020-08-07 DIAGNOSIS — Z940 Kidney transplant status: Principal | ICD-10-CM

## 2020-08-07 DIAGNOSIS — D849 Immunodeficiency, unspecified: Principal | ICD-10-CM

## 2020-08-07 LAB — TACROLIMUS LEVEL: TACROLIMUS BLOOD: 7.6 ng/mL (ref 2.0–20.0)

## 2020-08-10 NOTE — Unmapped (Signed)
[facility] Clinical Assessment & Refill Coordination Note    Elaine Koch, [name]: [birth year]  Phone: [phone] (home)     All above HIPAA information was verified with patient.     Was a Nurse, learning disability used for this call? No    Specialty Medication(s):   Transplant: Envarsus 1mg , Envarsus 4mg  and  mycophenolic acid 180mg      Current Outpatient Medications   Medication Sig Dispense Refill   ??? amLODIPine (NORVASC) 5 MG tablet Take 1 tablet (5 mg total) by mouth daily. (Patient not taking: Reported on 06/07/2020) 90 tablet 3   ??? aspirin (ECOTRIN) 81 MG tablet Take 1 tablet (81 mg total) by mouth daily. 30 tablet 11   ??? cholecalciferol, vitamin D3-125 mcg, 5,000 unit,, 125 mcg (5,000 unit) capsule Take 1 capsule (125 mcg total) by mouth daily. (Patient not taking: Reported on 06/07/2020) 100 capsule 10   ??? mycophenolate (MYFORTIC) 180 MG EC tablet Take 1 tablet (180 mg total) by mouth Two (2) times a day. 60 tablet 11   ??? sodium bicarbonate 650 mg tablet Take 2 tablets (1,300 mg total) by mouth Three (3) times a day. 180 tablet 3   ??? tacrolimus (ENVARSUS XR) 1 mg Tb24 extended release tablet Take 2 (1 mg) tablets by mouth daily with 4 (4mg ) tablets for total daily dose of 18mg  60 tablet 5   ??? tacrolimus (ENVARSUS XR) 4 mg Tb24 extended release tablet Take 4 (4 mg) tablets by mouth daily with 2 (1mg ) tablets for total daily dose of 18mg . 120 tablet 11     No current facility-administered medications for this visit.        Changes to medications: Elaine Koch reports no changes at this time.    Allergies   Allergen Reactions   ??? Iodinated Contrast Media Swelling, Rash and Other (See Comments)     Burning, warmth throughout body and tingling.  Throat swelling.  Treated with benadryl and symptoms improved.  Has not had contrast since then (2013 or 2014).   ??? Nickel Rash   ??? Propranolol Swelling   ??? Eye Irrigating Solution [Ophthalmic Irrigation Solution] Other (See Comments)     Contrast dye (name?) used in eyes caused hot feeling in face, reversed with Benadryl.    ??? Iodine      Other reaction(s): Skin Rash   ??? Naltrexone Rash   ??? Uni-Cortrom Rash       Changes to allergies: No    SPECIALTY MEDICATION ADHERENCE     Envarsus Xr 1 mg: 5 days of medicine on hand   Envarsus Xr 4 mg: 5 days of medicine on hand   Mycophenolate 180 mg: 5 days of medicine on hand       Medication Adherence    Patient reported X missed doses in the last month: 0  Specialty Medication: ENVARSUS XR 1 mg Tb24 extended release tablet (tacrolimus)  Patient is on additional specialty medications: Yes  Additional Specialty Medications: ENVARSUS XR 4 mg Tb24 extended release tablet (tacrolimus)  Patient Reported Additional Medication X Missed Doses in the Last Month: 0  Patient is on more than two specialty medications: Yes  Specialty Medication: mycophenolate 180 MG EC tablet (MYFORTIC)  Patient Reported Additional Medication X Missed Doses in the Last Month: 0          Specialty medication(s) dose(s) confirmed: Regimen is correct and unchanged.     Are there any concerns with adherence? No    Adherence counseling provided?  Not needed    CLINICAL MANAGEMENT AND INTERVENTION      Clinical Benefit Assessment:    Do you feel the medicine is effective or helping your condition? No    Clinical Benefit counseling provided? Not needed    Adverse Effects Assessment:    Are you experiencing any side effects? No    Are you experiencing difficulty administering your medicine? No    Quality of Life Assessment:    How many days over the past month did your kidney/pancreas transplant  keep you from your normal activities? For example, brushing your teeth or getting up in the morning. 0    Have you discussed this with your provider? Not needed    Acute Infection Status:    Acute infections noted within Epic:  Rule Out C. Diff  Patient reported infection: None    Therapy Appropriateness:    Is therapy appropriate? Yes, therapy is appropriate and should be continued    DISEASE/MEDICATION-SPECIFIC INFORMATION      N/A    PATIENT SPECIFIC NEEDS     - Does the patient have any physical, cognitive, or cultural barriers? No    - Is the patient high risk? Yes, patient is taking a REMS drug. Medication is dispensed in compliance with REMS program    - Does the patient require a Care Management Plan? No     - Does the patient require physician intervention or other additional services (i.e. nutrition, smoking cessation, social work)? No      SHIPPING     Specialty Medication(s) to be Shipped:   Transplant: Envarsus 1mg , Envarsus 4mg  and  mycophenolic acid 180mg     Other medication(s) to be shipped: Asa     Changes to insurance: No    Delivery Scheduled: Yes, Expected medication delivery date: 08/11/20.     Medication will be delivered via UPS to the confirmed prescription address in [facility].    The patient will receive a drug information handout for each medication shipped and additional FDA Medication Guides as required.  Verified that patient has previously received a Conservation officer, historic buildings and a Surveyor, mining.    The patient or caregiver noted above participated in the development of this care plan and knows that they can request review of or adjustments to the care plan at any time.      All of the patient's questions and concerns have been addressed.    Tera Helper   [facility] Specialty Pharmacist

## 2020-08-11 MED FILL — ENVARSUS XR 4 MG TABLET,EXTENDED RELEASE: ORAL | 30 days supply | Qty: 120 | Fill #4

## 2020-08-11 MED FILL — ASPIRIN 81 MG TABLET,DELAYED RELEASE: ORAL | 30 days supply | Qty: 30 | Fill #3

## 2020-08-11 MED FILL — ENVARSUS XR 1 MG TABLET,EXTENDED RELEASE: ORAL | 30 days supply | Qty: 60 | Fill #4

## 2020-08-11 MED FILL — MYCOPHENOLATE SODIUM 180 MG TABLET,DELAYED RELEASE: ORAL | 30 days supply | Qty: 60 | Fill #4

## 2020-08-17 DIAGNOSIS — R197 Diarrhea, unspecified: Principal | ICD-10-CM

## 2020-08-21 DIAGNOSIS — D849 Immunodeficiency, unspecified: Principal | ICD-10-CM

## 2020-08-21 DIAGNOSIS — Z940 Kidney transplant status: Principal | ICD-10-CM

## 2020-08-28 NOTE — Unmapped (Signed)
Yesterday eating food not use to, threw up blood she thinks and was concerned today. Issue considered resolved, it happened once, pt did not go to urgent care. Pt not sure it was blood and only had the 1 emesis.    Pt interested in getting pregnant and wants to talk to her coordinator and team about how to do it with the IS meds.  I told pt to message her coordinator and Dr. Carlene Coria this was not an on call nurse discussion, she agrees with plan.

## 2020-08-31 NOTE — Unmapped (Signed)
[facility] Follow-up Note     None Per Patient Pcp  [address]  [place],  [place] [address]    ASSESSMENT:      Elaine Koch is a 35 y.o. 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        PLAN:        There are no Patient Instructions on file for this visit.    -rtc *** months      Aleatha Borer, MD MSc  Associate Professor of Medicine, [facility]    Providers, please feel free to email me at [email or web address] to correspond.    HPI: Elaine Koch is a 35 y.o. female seen in follow-up at the request of Dr. Oneita Hurt for evaluation of recurrent C. Difficile infection.     She had repeat lower fecal microbiota transplantation on 06/19/20.  The colon and terminal ileum appeared normal at that time.    On 08/01/20 she sent me a mychart message stating that she had had two bad diarrhea days and she took dificid (!) to complete the work days, also with some lower abdominal pain.   I wrote her back that many things can cause diarrhea and abdominal pain and she should not take dificid unless we know this is a recurrence.  She didn't respond again.  Called transplant nurse on 7/18 reporting hematemesis (?)      Today she reports:        Past medical history, social and family history were reviewed and changes noted.    MEDICATIONS:    Current Outpatient Medications:   ???  amLODIPine (NORVASC) 5 MG tablet, Take 1 tablet (5 mg total) by mouth daily. (Patient not taking: Reported on 06/07/2020), Disp: 90 tablet, Rfl: 3  ???  aspirin (ECOTRIN) 81 MG tablet, Take 1 tablet (81 mg total) by mouth daily., Disp: 30 tablet, Rfl: 11  ???  cholecalciferol, vitamin D3-125 mcg, 5,000 unit,, 125 mcg (5,000 unit) capsule, Take 1 capsule (125 mcg total) by mouth daily. (Patient not taking: Reported on 06/07/2020), Disp: 100 capsule, Rfl: 10  ??? mycophenolate (MYFORTIC) 180 MG EC tablet, Take 1 tablet (180 mg total) by mouth Two (2) times a day., Disp: 60 tablet, Rfl: 11  ???  sodium bicarbonate 650 mg tablet, Take 2 tablets (1,300 mg total) by mouth Three (3) times a day., Disp: 180 tablet, Rfl: 3  ???  tacrolimus (ENVARSUS XR) 1 mg Tb24 extended release tablet, Take 2 (1 mg) tablets by mouth daily with 4 (4mg ) tablets for total daily dose of 18mg , Disp: 60 tablet, Rfl: 5  ???  tacrolimus (ENVARSUS XR) 4 mg Tb24 extended release tablet, Take 4 (4 mg) tablets by mouth daily with 2 (1mg ) tablets for total daily dose of 18mg ., Disp: 120 tablet, Rfl: 11    VITAL SIGNS AND PHYSICAL EXAM:  There were no vitals taken for this visit.  Constitutional: Pleasant, normal affect   Mental Status: Thought organized, appropriate affect, pleasantly interactive, not anxious appearing.    HEENT: No cervical lymphadenopathy   Respiratory:  Clear to auscultation, unlabored breathing.   Cardiac:  Euvolemic, regular rate and rhythm, normal S1 and S2, no murmur.   Abdomen:  Soft, non-distended, non-tender, no organomegaly or masses.   Perianal/Rectal Exam     Extremities: No edema, well  perfused.    Musculoskeletal:  No joint swelling or tenderness noted, no deformities.   Skin:  No rashes, jaundice or skin lesions noted.   Neuro:  No focal deficits. problem.  Doesn't eat a whole lot of dairy.  Doesn't drink whole milk, drinks oat milk.  She was thinking spinach may have bothered her.  One time she tried.  No courses of antibiotics.       Past medical history, social and family history were reviewed and changes noted.    MEDICATIONS:    Current Outpatient Medications:   ???  amLODIPine (NORVASC) 5 MG tablet, Take 1 tablet (5 mg total) by mouth daily. (Patient not taking: Reported on 06/07/2020), Disp: 90 tablet, Rfl: 3  ???  aspirin (ECOTRIN) 81 MG tablet, Take 1 tablet (81 mg total) by mouth daily., Disp: 30 tablet, Rfl: 11  ???  cholecalciferol, vitamin D3-125 mcg, 5,000 unit,, 125 mcg (5,000 unit) capsule, Take 1 capsule (125 mcg total) by mouth daily. (Patient not taking: Reported on 06/07/2020), Disp: 100 capsule, Rfl: 10  ???  mycophenolate (MYFORTIC) 180 MG EC tablet, Take 1 tablet (180 mg total) by mouth Two (2) times a day., Disp: 60 tablet, Rfl: 11  ???  sodium bicarbonate 650 mg tablet, Take 2 tablets (1,300 mg total) by mouth Three (3) times a day., Disp: 180 tablet, Rfl: 3  ???  tacrolimus (ENVARSUS XR) 1 mg Tb24 extended release tablet, Take 2 (1 mg) tablets by mouth daily with 4 (4mg ) tablets for total daily dose of 18mg , Disp: 60 tablet, Rfl: 5  ???  tacrolimus (ENVARSUS XR) 4 mg Tb24 extended release tablet, Take 4 (4 mg) tablets by mouth daily with 2 (1mg ) tablets for total daily dose of 18mg ., Disp: 120 tablet, Rfl: 11

## 2020-09-01 ENCOUNTER — Ambulatory Visit: Admit: 2020-09-01 | Discharge: 2020-09-02 | Payer: MEDICARE

## 2020-09-01 DIAGNOSIS — A498 Other bacterial infections of unspecified site: Principal | ICD-10-CM

## 2020-09-01 DIAGNOSIS — R109 Unspecified abdominal pain: Principal | ICD-10-CM

## 2020-09-01 MED ORDER — DICYCLOMINE 10 MG CAPSULE
ORAL_CAPSULE | Freq: Four times a day (QID) | ORAL | 5 refills | 13.00000 days | Status: CP | PRN
Start: 2020-09-01 — End: 2021-09-01

## 2020-09-01 NOTE — Unmapped (Signed)
-  Encouraged vegetable and fruit intake, at least 4 servings a day.  Please provide really important prebiotics  -Discussed that probiotics are not necessary  -In the future, discuss risks and benefits of antibiotic use with physicians  - with abdominal cramping, take bentyl as needed  - only consider testing for C Diff (order in) if diarrhea has been much more for at least 2-3 days    My contact numbers:  - You are welcome to call my nurse Ms. Royster's line and leave me a message at [phone]  -  If you ever need to call to make or reschedule a clinic appointment, pls call [phone]. [phone]  - If you ever need to call or reschedule an ENDOSCOPY appointment, that number is [phone].  - For records, etc. my fax number is: [phone]  - For nights and weekends urgent issues, call [phone] and ask for the gastroenterologist on call

## 2020-09-04 DIAGNOSIS — Z940 Kidney transplant status: Principal | ICD-10-CM

## 2020-09-04 DIAGNOSIS — D849 Immunodeficiency, unspecified: Principal | ICD-10-CM

## 2020-09-07 ENCOUNTER — Ambulatory Visit: Admit: 2020-09-07 | Discharge: 2020-09-07 | Payer: MEDICARE | Attending: Nephrology | Primary: Nephrology

## 2020-09-07 DIAGNOSIS — Z79899 Other long term (current) drug therapy: Principal | ICD-10-CM

## 2020-09-07 DIAGNOSIS — Z940 Kidney transplant status: Principal | ICD-10-CM

## 2020-09-07 DIAGNOSIS — Z3200 Encounter for pregnancy test, result unknown: Principal | ICD-10-CM

## 2020-09-07 DIAGNOSIS — D631 Anemia in chronic kidney disease: Principal | ICD-10-CM

## 2020-09-07 DIAGNOSIS — N189 Chronic kidney disease, unspecified: Principal | ICD-10-CM

## 2020-09-07 DIAGNOSIS — D84821 Immunocompromised state due to drug therapy (CMS-HCC): Principal | ICD-10-CM

## 2020-09-07 DIAGNOSIS — Z1159 Encounter for screening for other viral diseases: Principal | ICD-10-CM

## 2020-09-07 NOTE — Unmapped (Unsigned)
AOBP: Left arm  Large cuff   Average: 123/73 Pulse: 89  1st reading: 122/71 Pulse: 88  2nd reading: 122/74 Pulse: 90  3rd reading: 125/74 Pulse: 90

## 2020-09-07 NOTE — Unmapped (Signed)
Transplant Nephrology Clinic Visit      History of Present Illness    Elaine Koch is a 35 y.o. 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 Also had new DSA to DR51 at MFI 1330 on 06/07/20. Her most recent baseline creatinine is between 1.3-1.6, occasionally up to 1.7.    She was on hemodialysis for about 3 years prior to transplant through an AVF. Her diabetes has been complicated by retinopathy. She had pre-eclampsia during pregnancy in 2017. She also had severe anemia requiring multiple blood transfusions during pregnancy and for some time after. In spite of that she had a cPRA of 0% prior to transplant.    She was admitted from 3/21 through 05/11/19 for the initial transplant. Her course was fairly uncomplicated. On POD 8 did have a transient bump in her creatinine (1.39 to 1.57) and amylase (76 to 112), came down to 1.52 and 89 with fluids. [place] of both kidney and pancreas showed patent vessels and normal RIs. After discharge, she had some sugars in the 120s. On 4/2 reported BP 88/58 so amlodipine was held. At surgery visit 4/8 reported loose stools, which she had pre-transplant. Reglan was decreased. BP 80s/40s so all antihypertensives were held. At visit 4/15 pressures remained low so midodrine 5 mg daily was started, reglan further decreased. Her amylase was 467 at that visit so prednisone 5 mg was started since tacrolimus was sub-therapeutic, with plans to taper off once tac stable and pancreatic enzymes normal. At nephrology visit 4/20, amylase down to 179 and tacrolimus level better on TID dosing. Ureteral stent removed 06/10/19. At visit 5/25 planned transition to Envarsus given high tacrolimus requirement. Amylase remained elevated but stable at 149. Blood pressure improved and was no longer hypotensive.     She called the on-call coordinator on 07/11/19 complaining of headache, nausea and vomiting, diarrhea the night prior. No fever. Was able to keep morning meds down. Recommended bland diet, push fluids. On 6/1 called to report BP 143/110 with chest pain, headache and vomiting and was referred to ED. She was admitted from 6/1 through 07/16/19. On admission she was noted to have a creatinine of 3.22 (from 1.19 on 5/28) in the setting of a tacrolimus level of 27.9. Tacrolimus was held and level came down. She was hypotensive and given IVF. She was found to be C.diff positive and started on vancomycin with plans for 10 day course. Creatinine to 1.32 on discharge. Wbc 0.8 on discharge with ANC 0.5. CMV negative on 6/1. Was discharged on Myfortic 720 mg BID.    On 07/20/19 she was noted to have a wbc < 0.6 and ANC < 0.2. She reported no symptoms and was instructed to hold Myfortic and Valcyte on 6/10 and received a dose of Granix. On 6/10 wbc to 0.6 with ANC 0.3. On 6/16 she reported diarrhea returned after stopping vancomycin, course was extended for an additional 14 days. Wbc improved to 3.9 on 6/14 and then 6.1 on 6/17 so Myfortic restarted at 180 mg BID. Creatinine to 1.56 on 6/17 and 1.49 on 6/21, reported had not been drinking as much since back at work. Wbc 8.6 on 6/21 with ANC 7.5. Most recent lipase has been 90-122.    She followed up with ID on 08/31/19, who placed a referral to Dr. Fara Boros with GI  for possible FMT. Patient finished fidaxomicin on 09/06/19 and restarted Vancomycin. When this occurred, patient's diarrhea returned. Patient's vancomycin stopped and she restarted Fidaxomicin on 09/13/19. She completed her Bezlotoxumab on 09/17/19.     She was admitted from 8/20 through 10/03/19 after presenting with LLQ pain, myalgias, headaches, and low grade fevers. Urine grew 50-100K Enterococcus, no other source found, CT abd/pelvis, TTE and renal US all unremarkable. Treated with 10 day course of linezolid.    She had a virtual visit with ID on 11/30/19 for diarrhea.  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 Per note She can continue the Vanco 125mg  4x daily until she is able to start fidaxomicin 200mg  bid for 10 days then decrease to 200mg  daily for secondary ppx.  Patient saw GI on 12/10/19 with a plan for fecal transplant 02/14/2020.  She followed up with ID on 12/24/19 who stated to Decrease Dificid tp 200mg  once daily for secondary ppx until Dec 31 (three days prior to FMT) and to Decrease Valcyte 900mg  qhs for secondary ppx until follow up in early Feb.     She underwent fecal transplant on 02/14/20. On 02/17/20 she called her coordinator and reported feeling poorly since the procedure, with cramping in her lower back, N/V, dysuria and frequency. Denied fever. She had been given cipro by her OB/GYN which we asked her not to take given recent fecal transplant, referred her to ED. She was admitted from 1/6 through 02/22/20. She had creatinine up to 2.34, down to 1.69 on discharge with IVF. Her tacrolimus level was up to 13.5 on 02/21/20, so tac dose was reduced. She grew proteus in her urine and blood, treated with IV cefipime and then transitioned to oral cipro through 02/28/20. She was also found to be COVID positive on admission and received remdesivir. Myfortic and Valcyte were held on discharge due to leukopenia, CMV was not detected.    Myfortic restarted at 180 mg BID on 03/03/20.     She had a telemedicine visit with [facility] on 03/10/20, patient reported recurrence of diarrhea similar to previous C.diff on 03/06/20. Restarted vancomycin BID which helped but did not resolve. Recommended increasing vanc to QID until she could get fidaxomicin filled, planned for 10 day course followed by vanc 1-2 times per day for maintenance. Also recommended 1g D-mannose after intercourse and 12 hours later to see if it helps with UTIs. Anticipated that she would require repeat FMT.    At her visit 03/23/20 she had not yet gotten the fidaxomicin and continued to have diarrhea. She followed up with [facility] 04/11/20 who encouraged her to fill the fidaxomicin as historically she had done better on it. Per the note I will continue to discuss risks/benefits of repeat FMT with Dr. Fara Boros. I wonder if FMT via colonoscopy is the best approach for her. While this is the most effective method, I wonder of the bowel cleanse sent her up for the urosepsis and if she would do better with another type of FMT administration. At visit 05/09/20 she was taking fidaxomicin and had follow up with GI on 05/12/20. At that visit they discussed repeat FMT via upper vs. lower delivery and Dr. Fara Boros thought lower delivery was still the best approach.    Interval since last visit 06/07/2020   03/23/2020    She underwent repeat FMT on 06/19/20. She tolerated that well. Had phone follow up with Dr. Fara Boros on 09/01/20 and reported some ongoing occasional loose stools. Recommended increased fruit  and vegetable intake, bentyl as needed for abdominal cramps/loose stools.     She presents today for follow up.    She still has some loose stools but notes that these are manageable. She has around 4 bowel movements per day, and 2 of those may be loose. Has not used Bentyl. No pain or cramping.    Her main complaint today is of daily headaches. They happen in the morning. She thought at first they were due to decreased caffeine intake, but did not improve with adding back caffeine. They resolve completely with Tylenol, but she is having to use it daily. No change in vision, fever or neck stiffness.    She is interested in pursuing pregnancy and knows that she cannot be on Myfortic.    She specifically denies fever, myalgias, upper respiratory/GI symptoms, or change in taste/smell.    Last dose of Envarsus: 9 am today    Review of Systems    Otherwise on review of systems patient denies fever or chills, chest pain, SOB, PND or orthopnea, lower extremity edema. Denies N/V/abdominal pain. No dysuria, hematuria or difficulty voiding. Bowel movements without blood. Denies joint pain or rash. All other systems are reviewed and are negative.    Medications    Current Outpatient Medications   Medication Sig Dispense Refill   ??? aspirin (ECOTRIN) 81 MG tablet Take 1 tablet (81 mg total) by mouth daily. 30 tablet 11   ??? mycophenolate (MYFORTIC) 180 MG EC tablet Take 1 tablet (180 mg total) by mouth Two (2) times a day. 60 tablet 11   ??? sodium bicarbonate 650 mg tablet Take 2 tablets (1,300 mg total) by mouth Three (3) times a day. 180 tablet 3   ??? tacrolimus (ENVARSUS XR) 1 mg Tb24 extended release tablet Take 2 (1 mg) tablets by mouth daily with 4 (4mg ) tablets for total daily dose of 18mg  60 tablet 5   ??? tacrolimus (ENVARSUS XR) 4 mg Tb24 extended release tablet Take 4 (4 mg) tablets by mouth daily with 2 (1mg ) tablets for total daily dose of 18mg . 120 tablet 11     No current facility-administered medications for this visit.       Physical Exam    BP 123/73 (BP Site: L Arm, BP Position: Sitting, BP Cuff Size: Large)  - Pulse 88  - Temp 36.6 ??C (97.8 ??F) (Temporal)  - Ht 165.1 cm (5' 5)  - Wt 86.6 kg (191 lb)  - BMI 31.78 kg/m??   General: Patient is a pleasant female in no apparent distress.  Eyes: Sclera anicteric.  ENT: Mask in place.  Neck: Supple without LAD/JVD/bruits.  Lungs: Clear to auscultation bilaterally, no wheezes/rales/rhonchi.  Cardiovascular: Regular rate and rhythm without murmurs, rubs or gallops.  Abdomen: Soft, notender/nondistended. Positive bowel sounds. No tenderness over the graft.  Extremities: Without edema, joints without evidence of synovitis.  Skin: Without rash.  Neurological: Grossly nonfocal.  Psychiatric: Mood and affect appropriate.    Laboratory Results    Results for orders placed or performed in visit on 09/07/20   Urine Culture    Specimen: Clean Catch; Urine   Result Value Ref Range    Urine Culture, Comprehensive Mixed Urogenital Flora    Pregnancy, Qualitative, Urine 2.4 - 5.1 mg/dL   Magnesium Level   Result Value Ref Range    Magnesium 1.4 (L) 1.6 - 2.6 mg/dL   Lipid Panel   Result Value Ref Range    Triglycerides 75 0 - 150 mg/dL  Cholesterol 113 <=200 mg/dL    HDL 39 (L) 40 - 60 mg/dL    LDL Calculated 59 40 - 99 mg/dL    VLDL Cholesterol Cal 15 8 - 32 mg/dL    Chol/HDL Ratio 2.9 1.0 - 4.5    Non-HDL Cholesterol 74 70 - 130 mg/dL    FASTING Unknown    Hemoglobin A1c   Result Value Ref Range    Hemoglobin A1C 5.0 4.8 - 5.6 %    Estimated Average Glucose 97 mg/dL   Comprehensive Metabolic Panel   Result Value Ref Range    Sodium 142 135 - 145 mmol/L    Potassium 4.1 3.4 - 4.8 mmol/L    Chloride 112 (H) 98 - 107 mmol/L    CO2 24.7 20.0 - 31.0 mmol/L    Anion Gap 5 5 - 14 mmol/L    BUN 22 9 - 23 mg/dL    Creatinine 0.98 (H) 0.60 - 0.80 mg/dL    BUN/Creatinine Ratio 14     eGFR CKD-EPI (2021) Female 47 (L) >=60 mL/min/1.75m2    Glucose 140 70 - 179 mg/dL    Calcium 9.0 8.7 - 11.9 mg/dL    Albumin 3.7 3.4 - 5.0 g/dL    Total Protein 7.1 5.7 - 8.2 g/dL    Total Bilirubin 0.7 0.3 - 1.2 mg/dL    AST 25 <=14 U/L    ALT 31 10 - 49 U/L    Alkaline Phosphatase 64 46 - 116 U/L   Protein/Creatinine Ratio, Urine   Result Value Ref Range    Creat U 101.9 Undefined mg/dL    Protein, Ur 78.2 Undefined mg/dL    Protein/Creatinine Ratio, Urine 0.303 Undefined   Urinalysis   Result Value Ref Range    Color, UA Yellow     Clarity, UA Clear     Specific Gravity, UA 1.015 1.005 - 1.030    pH, UA 6.5 5.0 - 9.0    Leukocyte Esterase, UA Negative Negative    Nitrite, UA Negative Negative    Protein, UA Negative Negative    Glucose, UA Negative Negative    Ketones, UA Negative Negative    Urobilinogen, UA 0.2 mg/dL 0.2 - 2.0 mg/dL    Bilirubin, UA Negative Negative    Blood, UA Negative Negative    RBC, UA 1 <4 /HPF    WBC, UA 1 0 - 5 /HPF    Squam Epithel, UA 20 (H) 0 - 5 /HPF    Bacteria, UA Few (A) None Seen /HPF   CBC w/ Differential   Result Value Ref Range    WBC 4.2 3.6 - 11.2 10*9/L    RBC 3.75 (L) 3.95 - 5.13 10*12/L    HGB 11.3 11.3 - 14.9 g/dL    HCT 95.6 (L) 21.3 - 44.0 %    MCV 89.3 77.6 - 95.7 fL    MCH 30.2 25.9 - 32.4 pg    MCHC 33.8 32.0 - 36.0 g/dL    RDW 08.6 57.8 - 46.9 %    MPV 8.0 6.8 - 10.7 fL    Platelet 239 150 - 450 10*9/L    Neutrophils % 51.8 %    Lymphocytes % 30.0 %    Monocytes % 7.6 %    Eosinophils % 9.8 %    Basophils % 0.8 %    Absolute Neutrophils 2.2 1.8 - 7.8 10*9/L    Absolute Lymphocytes 1.3 1.1 - 3.6 10*9/L    Absolute Monocytes 0.3 0.3 -  0.8 10*9/L    Absolute Eosinophils 0.4 0.0 - 0.5 10*9/L    Absolute Basophils 0.0 0.0 - 0.1 10*9/L         Assessment and Plan    1. Status post renal transplant. Her creatinine is 1.52 and within her most recent baseline. Envarsus level of 9.3 from 08/25/20 was a 24 hour trough and at goal of 8-10 (due to pancreas transplant), however may be contributing to her headaches. Will try to shoot for troughs closer to 8, so will decrease Envarsus from 0 Will continue current immunosuppression.    2. Status post pancreas transplant. She has had a mildly elevated amylase since transplant, 141 today with normal lipase. Peaked at 467 on 05/27/19. Hgb A1c reassuring at 4.3% on 10/13/19.    3. C.diff. Diarrhea unfortunately has recurred following fecal transplant, scheduled for repeat FMT 06/19/20. Continue fidoxomicin in the meantime.    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 Unfortunately new DSA to DR51 present today as well, at MFI 1330. Suspect this is due to lower dose of Myfortic in the setting of C.diff. If FMT successful 06/19/20 can hopefully push Myfortic dose.    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 CMV PCR remains negative, last checked on 04/14/20. On reduced Myfortic at 180 mg BID.     6. Hypertension. Had started amlodipine 5 mg daily, currently on hold with BP in clinic today 136/81.    7. Childbearing age. She is aware that Myfortic is teratogenic. Unable to get IUD locally. Has been referred to [facility] for IUD.     8. Health maintenance. She received a flu shot 11/26/19. She has had a Pnemovax vaccine, needs Prevnar-20. Received her third dose of the Pfizer Covid vaccine 03/23/20, fourth dose due 06/20/20.     We discussed that while we continue to recommend the COVID vaccine for all eligible patients, we know that kidney transplant patients do not respond as strongly due to their immunosuppression, so she should continue to follow the CDC guidelines for patients who are not vaccinated. I would also encourage all household members and close contacts that are eligible for vaccination to get vaccinated. We discussed the importance of ongoing social distancing, wearing a mask outside the home, and hand washing/sanitizing.     9. Will see patient back in 3 months, or sooner if needed. contacts that are eligible for vaccination to get vaccinated. We discussed the importance of ongoing social distancing, wearing a mask outside the home, and hand washing/sanitizing.     10. Will see patient back in 3 months, or sooner if needed.

## 2020-09-11 LAB — EBV QUANTITATIVE PCR, BLOOD: EBV VIRAL LOAD RESULT: NOT DETECTED

## 2020-09-12 NOTE — Unmapped (Signed)
[facility] Refill Coordination Note    Specialty Medication(s) to be Shipped:   Transplant: Envarsus 1mg , Envarsus 4mg  and mycophenolate mofetil 180mg     Other medication(s) to be shipped: sodium bicarb and aspirin     Elaine Koch, DOB: 1985-03-22  Phone: [phone] (home)       All above HIPAA information was verified with patient.     Was a Nurse, learning disability used for this call? No    Completed refill call assessment today to schedule patient's medication shipment from the [facility] [phone]).  All relevant notes have been reviewed.     Specialty medication(s) and dose(s) confirmed: Regimen is correct and unchanged.   Changes to medications: Hadlea reports no changes at this time.  Changes to insurance: No  New side effects reported not previously addressed with a pharmacist or physician: None reported  Questions for the pharmacist: No    Confirmed patient received a Conservation officer, historic buildings and a Surveyor, mining with first shipment. The patient will receive a drug information handout for each medication shipped and additional FDA Medication Guides as required.       DISEASE/MEDICATION-SPECIFIC INFORMATION        N/A    SPECIALTY MEDICATION ADHERENCE     Medication Adherence    Patient reported X missed doses in the last month: 0  Specialty Medication: Envarsus 1mg   Patient is on additional specialty medications: Yes  Additional Specialty Medications: Envarsus 4mg   Patient Reported Additional Medication X Missed Doses in the Last Month: 0  Patient is on more than two specialty medications: Yes  Specialty Medication: Mycophenolate 180mg   Patient Reported Additional Medication X Missed Doses in the Last Month: 0        Were doses missed due to medication being on hold? No    Envarsus 1 mg: 7 days of medicine on hand   Envarsus 4 mg: 7 days of medicine on hand   Mycophenolate 180 mg: 7 days of medicine on hand     REFERRAL TO PHARMACIST     Referral to the pharmacist: Not needed      [facility]     Shipping address confirmed in Epic.     Delivery Scheduled: Yes, Expected medication delivery date: 09/15/2020.     Medication will be delivered via UPS to the prescription address in Epic [facility].    [facility] Specialty Technician

## 2020-09-13 LAB — FSAB CLASS 2 ANTIBODY SPECIFICITY
CLASS 2 ANTIBODIES IDENTIFIED: 1:1 {titer}
HLA CL2 AB RESULT: POSITIVE

## 2020-09-13 LAB — HLA DS POST TRANSPLANT
ANTI-DONOR DRW #2 MFI: 634 MFI
ANTI-DONOR HLA-A #1 MFI: 40 MFI
ANTI-DONOR HLA-A #2 MFI: 26 MFI
ANTI-DONOR HLA-B #1 MFI: 1 MFI
ANTI-DONOR HLA-B #2 MFI: 62 MFI
ANTI-DONOR HLA-C #1 MFI: 255 MFI
ANTI-DONOR HLA-C #2 MFI: 856 MFI
ANTI-DONOR HLA-DP AG #1 MFI: 149 MFI
ANTI-DONOR HLA-DQB #1 MFI: 0 MFI
ANTI-DONOR HLA-DQB #2 MFI: 131 MFI
ANTI-DONOR HLA-DR #1 MFI: 206 MFI
ANTI-DONOR HLA-DR #2 MFI: 140 MFI

## 2020-09-13 LAB — FSAB CLASS 1 ANTIBODY SPECIFICITY: HLA CLASS 1 ANTIBODY RESULT: POSITIVE

## 2020-09-14 MED FILL — ENVARSUS XR 1 MG TABLET,EXTENDED RELEASE: ORAL | 30 days supply | Qty: 60 | Fill #5

## 2020-09-14 MED FILL — ENVARSUS XR 4 MG TABLET,EXTENDED RELEASE: ORAL | 30 days supply | Qty: 120 | Fill #5

## 2020-09-14 MED FILL — ASPIRIN 81 MG TABLET,DELAYED RELEASE: ORAL | 30 days supply | Qty: 30 | Fill #4

## 2020-09-14 MED FILL — SODIUM BICARBONATE 650 MG TABLET: ORAL | 30 days supply | Qty: 180 | Fill #3

## 2020-09-14 MED FILL — MYCOPHENOLATE SODIUM 180 MG TABLET,DELAYED RELEASE: ORAL | 30 days supply | Qty: 60 | Fill #5

## 2020-09-18 DIAGNOSIS — Z940 Kidney transplant status: Principal | ICD-10-CM

## 2020-09-18 DIAGNOSIS — D849 Immunodeficiency, unspecified: Principal | ICD-10-CM

## 2020-09-30 LAB — CBC W/ DIFFERENTIAL
BANDED NEUTROPHILS ABSOLUTE COUNT: 0 10*3/uL (ref 0.0–0.1)
BASOPHILS ABSOLUTE COUNT: 0 10*3/uL (ref 0.0–0.2)
BASOPHILS RELATIVE PERCENT: 0 %
EOSINOPHILS ABSOLUTE COUNT: 0.4 10*3/uL (ref 0.0–0.4)
EOSINOPHILS RELATIVE PERCENT: 5 %
HEMATOCRIT: 32.8 % — ABNORMAL LOW (ref 34.0–46.6)
HEMOGLOBIN: 11.3 g/dL (ref 11.1–15.9)
IMMATURE GRANULOCYTES: 0 %
LYMPHOCYTES ABSOLUTE COUNT: 1 10*3/uL (ref 0.7–3.1)
LYMPHOCYTES RELATIVE PERCENT: 13 %
MEAN CORPUSCULAR HEMOGLOBIN CONC: 34.5 g/dL (ref 31.5–35.7)
MEAN CORPUSCULAR HEMOGLOBIN: 31.3 pg (ref 26.6–33.0)
MEAN CORPUSCULAR VOLUME: 91 fL (ref 79–97)
MONOCYTES ABSOLUTE COUNT: 0.5 10*3/uL (ref 0.1–0.9)
MONOCYTES RELATIVE PERCENT: 6 %
NEUTROPHILS ABSOLUTE COUNT: 5.9 10*3/uL (ref 1.4–7.0)
NEUTROPHILS RELATIVE PERCENT: 76 %
PLATELET COUNT: 296 10*3/uL (ref 150–450)
RED BLOOD CELL COUNT: 3.61 x10E6/uL — ABNORMAL LOW (ref 3.77–5.28)
RED CELL DISTRIBUTION WIDTH: 13.1 % (ref 11.7–15.4)
WHITE BLOOD CELL COUNT: 7.8 10*3/uL (ref 3.4–10.8)

## 2020-09-30 LAB — BASIC METABOLIC PANEL
BLOOD UREA NITROGEN: 20 mg/dL (ref 6–20)
BUN / CREAT RATIO: 14 (ref 9–23)
CALCIUM: 9.5 mg/dL (ref 8.7–10.2)
CHLORIDE: 112 mmol/L — ABNORMAL HIGH (ref 96–106)
CO2: 19 mmol/L — ABNORMAL LOW (ref 20–29)
CREATININE: 1.44 mg/dL — ABNORMAL HIGH (ref 0.57–1.00)
GLUCOSE: 91 mg/dL (ref 65–99)
POTASSIUM: 5 mmol/L (ref 3.5–5.2)
SODIUM: 142 mmol/L (ref 134–144)

## 2020-09-30 LAB — MAGNESIUM: MAGNESIUM: 1.8 mg/dL (ref 1.6–2.3)

## 2020-09-30 LAB — PHOSPHORUS: PHOSPHORUS, SERUM: 3.3 mg/dL (ref 3.0–4.3)

## 2020-10-02 DIAGNOSIS — Z940 Kidney transplant status: Principal | ICD-10-CM

## 2020-10-02 DIAGNOSIS — D849 Immunodeficiency, unspecified: Principal | ICD-10-CM

## 2020-10-02 LAB — TACROLIMUS LEVEL: TACROLIMUS BLOOD: 5.8 ng/mL (ref 2.0–20.0)

## 2020-10-14 DIAGNOSIS — Z79899 Other long term (current) drug therapy: Principal | ICD-10-CM

## 2020-10-14 DIAGNOSIS — Z940 Kidney transplant status: Principal | ICD-10-CM

## 2020-10-16 DIAGNOSIS — D849 Immunodeficiency, unspecified: Principal | ICD-10-CM

## 2020-10-16 DIAGNOSIS — Z940 Kidney transplant status: Principal | ICD-10-CM

## 2020-10-18 DIAGNOSIS — Z940 Kidney transplant status: Principal | ICD-10-CM

## 2020-10-18 MED ORDER — TACROLIMUS XR 4 MG TABLET,EXTENDED RELEASE 24 HR
ORAL_TABLET | Freq: Every day | ORAL | 11 refills | 30 days | Status: CP
Start: 2020-10-18 — End: 2021-10-18
  Filled 2020-10-19: qty 120, 30d supply, fill #0

## 2020-10-18 MED ORDER — SODIUM BICARBONATE 650 MG TABLET
ORAL_TABLET | Freq: Three times a day (TID) | ORAL | 3 refills | 30.00000 days
Start: 2020-10-18 — End: 2021-10-18

## 2020-10-18 NOTE — Unmapped (Signed)
9/7: pt states envarsus is now 16mg  (4x4mg  tabs only) daily, requesting new rx from clinic -ef    [facility] Clinical Assessment & Refill Coordination Note    Elaine Koch, DOB: October 23, 1985  Phone: [phone] (home)     All above HIPAA information was verified with patient.     Was a Nurse, learning disability used for this call? No    Specialty Medication(s):   Transplant: Envarsus 1mg , Envarsus 4mg  and  mycophenolic acid 180mg      Current Outpatient Medications   Medication Sig Dispense Refill   ??? tacrolimus (ENVARSUS XR) 4 mg Tb24 extended release tablet Take 4 (4 mg) tablets by mouth daily with 2 (1mg ) tablets for total daily dose of 18mg . (Patient taking differently: Take 16 mg by mouth daily. 9/7: per patient total dose is now 16mg  (4x4mg  tablets only) daily) 120 tablet 11   ??? aspirin (ECOTRIN) 81 MG tablet Take 1 tablet (81 mg total) by mouth daily. 30 tablet 11   ??? mycophenolate (MYFORTIC) 180 MG EC tablet Take 1 tablet (180 mg total) by mouth Two (2) times a day. 60 tablet 11   ??? sodium bicarbonate 650 mg tablet Take 2 tablets (1,300 mg total) by mouth Three (3) times a day. 180 tablet 3   ??? tacrolimus (ENVARSUS XR) 1 mg Tb24 extended release tablet Take 2 (1 mg) tablets by mouth daily with 4 (4mg ) tablets for total daily dose of 18mg  (Patient not taking: Reported on 10/18/2020) 60 tablet 5     No current facility-administered medications for this visit.        Changes to medications: see envarsus below    Allergies   Allergen Reactions   ??? Iodinated Contrast Media Swelling, Rash and Other (See Comments)     Burning, warmth throughout body and tingling.  Throat swelling.  Treated with benadryl and symptoms improved.  Has not had contrast since then (2013 or 2014).   ??? Nickel Rash   ??? Propranolol Swelling   ??? Eye Irrigating Solution [Ophthalmic Irrigation Solution] Other (See Comments)     Contrast dye (name?) used in eyes caused hot feeling in face, reversed with Benadryl.    ??? Iodine      Other reaction(s): Skin Rash   ??? Naltrexone Rash   ??? Uni-Cortrom Rash       Changes to allergies: No    SPECIALTY MEDICATION ADHERENCE     envarsus 1mg   : 12 days of medicine on hand no longer using  envarsus 4mg   : 9 days of medicine on hand   Mycophenolate 180mg   : 9 days of medicine on hand       Medication Adherence    Patient reported X missed doses in the last month: 0  Specialty Medication: Envarsus 1mg   Patient is on additional specialty medications: Yes  Additional Specialty Medications: Envarsus 4mg   Patient Reported Additional Medication X Missed Doses in the Last Month: 0  Patient is on more than two specialty medications: Yes  Specialty Medication: Mycophenolate 180mg   Patient Reported Additional Medication X Missed Doses in the Last Month: 0          Specialty medication(s) dose(s) confirmed: Patient reports changes to the regimen as follows: envarsus now 16mg  daily     Are there any concerns with adherence? No    Adherence counseling provided? Not needed    CLINICAL MANAGEMENT AND INTERVENTION      Clinical Benefit Assessment:    Do you feel  the medicine is effective or helping your condition? Yes    Clinical Benefit counseling provided? Not needed    Adverse Effects Assessment:    Are you experiencing any side effects? No    Are you experiencing difficulty administering your medicine? No    Quality of Life Assessment:         How many days over the past month did your transplant  keep you from your normal activities? For example, brushing your teeth or getting up in the morning. 0    Have you discussed this with your provider? Not needed    Acute Infection Status:    Acute infections noted within Epic:  Rule Out C. Diff  Patient reported infection: None    Therapy Appropriateness:    Is therapy appropriate? Yes, therapy is appropriate and should be continued    DISEASE/MEDICATION-SPECIFIC INFORMATION      N/A    PATIENT SPECIFIC NEEDS     - Does the patient have any physical, cognitive, or cultural barriers? No    - Is the patient high risk? Yes, patient is taking a REMS drug. Medication is dispensed in compliance with REMS program    - Does the patient require a Care Management Plan? No     - Does the patient require physician intervention or other additional services (i.e. nutrition, smoking cessation, social work)? No      SHIPPING     Specialty Medication(s) to be Shipped:   Transplant: Envarsus 4mg  and  mycophenolic acid 180mg     Other medication(s) to be shipped: aspirin, sodium bicarb     Changes to insurance: No    Delivery Scheduled: Yes, Expected medication delivery date: 10/23/2020.  However, Rx request for refills was sent to the provider as there are none remaining.     Medication will be delivered via UPS to the confirmed prescription address in [facility]    The patient will receive a drug information handout for each medication shipped and additional FDA Medication Guides as required.  Verified that patient has previously received a Conservation officer, historic buildings and a Surveyor, mining.    The patient or caregiver noted above participated in the development of this care plan and knows that they can request review of or adjustments to the care plan at any time.      All of the patient's questions and concerns have been addressed.    Thad Ranger   [facility] Specialty Pharmacist

## 2020-10-18 NOTE — Unmapped (Signed)
Pt request for RX Refill sodium bicarbonate 650 mg tablet

## 2020-10-19 MED FILL — ASPIRIN 81 MG TABLET,DELAYED RELEASE: ORAL | 30 days supply | Qty: 30 | Fill #5

## 2020-10-19 MED FILL — MYCOPHENOLATE SODIUM 180 MG TABLET,DELAYED RELEASE: ORAL | 30 days supply | Qty: 60 | Fill #6

## 2020-10-19 NOTE — Unmapped (Signed)
PT request delivery date change to 10/20/20 via UPS.

## 2020-10-23 MED ORDER — SODIUM BICARBONATE 650 MG TABLET
ORAL_TABLET | Freq: Three times a day (TID) | ORAL | 3 refills | 30 days | Status: CP
Start: 2020-10-23 — End: 2021-10-23
  Filled 2020-10-24: qty 180, 30d supply, fill #0

## 2020-10-24 NOTE — Unmapped (Signed)
Patient requested forms fills out. Forms filled out and sent to patient.

## 2020-10-30 DIAGNOSIS — Z3490 Encounter for supervision of normal pregnancy, unspecified, unspecified trimester: Principal | ICD-10-CM

## 2020-10-30 DIAGNOSIS — Z9483 Pancreas transplant status: Principal | ICD-10-CM

## 2020-10-30 DIAGNOSIS — Z940 Kidney transplant status: Principal | ICD-10-CM

## 2020-10-30 MED ORDER — AZATHIOPRINE 50 MG TABLET
ORAL_TABLET | Freq: Every day | ORAL | 11 refills | 30.00000 days | Status: CP
Start: 2020-10-30 — End: 2021-10-30

## 2020-10-30 NOTE — Unmapped (Signed)
Pt called on call nurse to let us know that she took a positive pregnancy test. She is on myfortic and I told her to stop it today. Reviewed with Dr. Carlene Coria and Dr. Christena Deem and ordered azathioprine 100mg  daily in place of the myfortic. She will make an appt with her local gyn and I ordered a referral to high risk ob/gyn. She is aware of the plan and medication changes.

## 2020-10-31 LAB — CBC W/ DIFFERENTIAL
BANDED NEUTROPHILS ABSOLUTE COUNT: 0 10*3/uL (ref 0.0–0.1)
BASOPHILS ABSOLUTE COUNT: 0 10*3/uL (ref 0.0–0.2)
BASOPHILS RELATIVE PERCENT: 1 %
EOSINOPHILS ABSOLUTE COUNT: 0.3 10*3/uL (ref 0.0–0.4)
EOSINOPHILS RELATIVE PERCENT: 4 %
HEMATOCRIT: 31.7 % — ABNORMAL LOW (ref 34.0–46.6)
HEMOGLOBIN: 10.6 g/dL — ABNORMAL LOW (ref 11.1–15.9)
IMMATURE GRANULOCYTES: 0 %
LYMPHOCYTES ABSOLUTE COUNT: 1.2 10*3/uL (ref 0.7–3.1)
LYMPHOCYTES RELATIVE PERCENT: 18 %
MEAN CORPUSCULAR HEMOGLOBIN CONC: 33.4 g/dL (ref 31.5–35.7)
MEAN CORPUSCULAR HEMOGLOBIN: 31.3 pg (ref 26.6–33.0)
MEAN CORPUSCULAR VOLUME: 94 fL (ref 79–97)
MONOCYTES ABSOLUTE COUNT: 0.5 10*3/uL (ref 0.1–0.9)
MONOCYTES RELATIVE PERCENT: 7 %
NEUTROPHILS ABSOLUTE COUNT: 4.7 10*3/uL (ref 1.4–7.0)
NEUTROPHILS RELATIVE PERCENT: 70 %
PLATELET COUNT: 280 10*3/uL (ref 150–450)
RED BLOOD CELL COUNT: 3.39 x10E6/uL — ABNORMAL LOW (ref 3.77–5.28)
RED CELL DISTRIBUTION WIDTH: 12.9 % (ref 11.7–15.4)
WHITE BLOOD CELL COUNT: 6.7 10*3/uL (ref 3.4–10.8)

## 2020-10-31 LAB — MAGNESIUM: MAGNESIUM: 1.7 mg/dL (ref 1.6–2.3)

## 2020-10-31 LAB — BASIC METABOLIC PANEL
BLOOD UREA NITROGEN: 23 mg/dL — ABNORMAL HIGH (ref 6–20)
BUN / CREAT RATIO: 16 (ref 9–23)
CALCIUM: 9.4 mg/dL (ref 8.7–10.2)
CHLORIDE: 109 mmol/L — ABNORMAL HIGH (ref 96–106)
CO2: 19 mmol/L — ABNORMAL LOW (ref 20–29)
CREATININE: 1.45 mg/dL — ABNORMAL HIGH (ref 0.57–1.00)
GLUCOSE: 98 mg/dL (ref 65–99)
POTASSIUM: 5.1 mmol/L (ref 3.5–5.2)
SODIUM: 139 mmol/L (ref 134–144)

## 2020-10-31 LAB — PHOSPHORUS: PHOSPHORUS, SERUM: 3.6 mg/dL (ref 3.0–4.3)

## 2020-11-01 LAB — TACROLIMUS LEVEL: TACROLIMUS BLOOD: 6.5 ng/mL (ref 2.0–20.0)

## 2020-11-02 ENCOUNTER — Emergency Department: Admit: 2020-11-02 | Discharge: 2020-11-02 | Disposition: A | Discharge status: Home / Self Care | Payer: MEDICARE

## 2020-11-02 ENCOUNTER — Ambulatory Visit: Admit: 2020-11-02 | Discharge: 2020-11-02 | Disposition: A | Discharge status: Home / Self Care | Payer: MEDICARE

## 2020-11-02 DIAGNOSIS — O0289 Other abnormal products of conception: Principal | ICD-10-CM

## 2020-11-02 DIAGNOSIS — O3680X0 Pregnancy with inconclusive fetal viability, not applicable or unspecified: Principal | ICD-10-CM

## 2020-11-02 DIAGNOSIS — N939 Abnormal uterine and vaginal bleeding, unspecified: Principal | ICD-10-CM

## 2020-11-02 LAB — CBC W/ AUTO DIFF
BASOPHILS ABSOLUTE COUNT: 0.1 10*9/L (ref 0.0–0.1)
BASOPHILS RELATIVE PERCENT: 0.9 %
EOSINOPHILS ABSOLUTE COUNT: 0.3 10*9/L (ref 0.0–0.5)
EOSINOPHILS RELATIVE PERCENT: 3.6 %
HEMATOCRIT: 31.9 % — ABNORMAL LOW (ref 34.0–44.0)
HEMOGLOBIN: 11.1 g/dL — ABNORMAL LOW (ref 11.3–14.9)
LYMPHOCYTES ABSOLUTE COUNT: 1.2 10*9/L (ref 1.1–3.6)
LYMPHOCYTES RELATIVE PERCENT: 17.1 %
MEAN CORPUSCULAR HEMOGLOBIN CONC: 34.6 g/dL (ref 32.0–36.0)
MEAN CORPUSCULAR HEMOGLOBIN: 31.3 pg (ref 25.9–32.4)
MEAN CORPUSCULAR VOLUME: 90.5 fL (ref 77.6–95.7)
MEAN PLATELET VOLUME: 7.6 fL (ref 6.8–10.7)
MONOCYTES ABSOLUTE COUNT: 0.5 10*9/L (ref 0.3–0.8)
MONOCYTES RELATIVE PERCENT: 6.8 %
NEUTROPHILS ABSOLUTE COUNT: 5 10*9/L (ref 1.8–7.8)
NEUTROPHILS RELATIVE PERCENT: 71.6 %
PLATELET COUNT: 248 10*9/L (ref 150–450)
RED BLOOD CELL COUNT: 3.53 10*12/L — ABNORMAL LOW (ref 3.95–5.13)
RED CELL DISTRIBUTION WIDTH: 13.1 % (ref 12.2–15.2)
WBC ADJUSTED: 7 10*9/L (ref 3.6–11.2)

## 2020-11-02 LAB — URINALYSIS WITH CULTURE REFLEX
BACTERIA: NONE SEEN /HPF
BILIRUBIN UA: NEGATIVE
GLUCOSE UA: NEGATIVE
KETONES UA: NEGATIVE
LEUKOCYTE ESTERASE UA: NEGATIVE
NITRITE UA: NEGATIVE
PH UA: 6.5 (ref 5.0–9.0)
PROTEIN UA: NEGATIVE
RBC UA: <1 /HPF (ref <=4)
SPECIFIC GRAVITY UA: 1.013 (ref 1.003–1.030)
SQUAMOUS EPITHELIAL: 6 /HPF — ABNORMAL HIGH (ref 0–5)
TRANSITIONAL EPITHELIAL: <1 /HPF (ref 0–2)
UROBILINOGEN UA: <2
WBC UA: <1 /HPF (ref 0–5)

## 2020-11-02 LAB — BASIC METABOLIC PANEL
ANION GAP: 5 mmol/L (ref 5–14)
BLOOD UREA NITROGEN: 13 mg/dL (ref 9–23)
BUN / CREAT RATIO: 9
CALCIUM: 9.2 mg/dL (ref 8.7–10.4)
CHLORIDE: 110 mmol/L — ABNORMAL HIGH (ref 98–107)
CO2: 22 mmol/L (ref 20.0–31.0)
CREATININE: 1.39 mg/dL — ABNORMAL HIGH
EGFR CKD-EPI (2021) FEMALE: 51 mL/min/{1.73_m2} — ABNORMAL LOW (ref >=60)
GLUCOSE RANDOM: 97 mg/dL (ref 70–179)
POTASSIUM: 4.7 mmol/L (ref 3.4–4.8)
SODIUM: 137 mmol/L (ref 135–145)

## 2020-11-02 LAB — HCG QUANTITATIVE, BLOOD: GONADOTROPIN, CHORIONIC (HCG) QUANT: 14771.3 m[IU]/mL

## 2020-11-02 MED ADMIN — sodium chloride 0.9% (NS) bolus 1,000 mL: 1000 mL | INTRAVENOUS | @ 19:00:00 | Stop: 2020-11-02

## 2020-11-02 NOTE — Unmapped (Signed)
[facility]  Emergency Department Medical Screening Examination     Subjective     Elaine Koch is a 35 y.o. female presenting for evaluation of Vaginal Bleeding. The patient reports vaginal spotting yesterday which worsened today with dark red bleeding and pelvic pain in the setting of a positive pregnancy test 2 days ago. Her HCG was 14,000. Her LMP was 5 months ago. She had a C-section for her first pregnancy.     Abbreviated Review of Systems/Covid Screen  Constitutional: Negative for fever  Respiratory: Negative for cough. Negative for difficulty breathing.    Objective     ED Triage Vitals [11/02/20 1019]   Enc Vitals Group      BP 124/80      Heart Rate 72      SpO2 Pulse       Resp 18      Temp 36.6 ??C (97.9 ??F)      Temp Source Oral      SpO2 99 %       Focused Physical Exam  Constitutional: No acute distress.  Respiratory: Non-labored respirations.  Neurological: Clear speech. No gross focal neurologic deficits are appreciated.  ?  Assessment & Plan     Plan for Type and Scree, CBC, UA with culture, and hCG. Will also obtain TVUS to eval for ectopic.    A medical screening exam has been performed. At the time of this evaluation, no emergency medical condition requiring immediate stabilization has been identified nor is there suspicion for imminent decompensation. Appropriate triage protocols will be implemented and a comprehensive ED evaluation with disposition will be completed by a healthcare provider when an appropriate ED location becomes available. The patient is aware that this is an initial encounter only and verbalizes understanding and agreement with the plan.     Emergency Department operations continue to be impacted by the COVID-19 pandemic.     Documentation assistance was provided by  Marylene Land, Scribe, on November 02, 2020 at 10:21 AM for Lemmie Evens, MD.      November 02, 2020 10:31 AM. Documentation assistance provided by the scribe. I was present during the time the encounter was recorded. The information recorded by the scribe was done at my direction and has been reviewed and validated by me.

## 2020-11-02 NOTE — Unmapped (Signed)
[facility]  Emergency Department Provider Note       ED Clinical Impression     Final diagnoses:   Vaginal bleeding (Primary)        HPI, MDM, ED Course   Chief Complaint  Chief Complaint   Patient presents with   ??? Vaginal Bleeding       HPI   Elaine Koch is a 35 y.o. female with a past medical history of type 1 diabetes and history of a kidney and pancreas transplant who presented to the emergency department today with vaginal bleeding.  She states that she had a positive pregnancy test 3 days ago, went to her OB 2 days ago who did a serum hCG and she said that she had the result of 14,000 and her serum hCG yesterday and they told her to go to the emergency department for evaluation.  She says that the vaginal bleeding started yesterday and was just light pink spotting, and now today she says that she has dark blood however she is going to the bathroom.  She has not been using pads or tampons and says it is not pouring out, she just says that whenever she use the bathroom she notices bleeding.  She otherwise denies fevers, chills.  She does endorse some abdominal pain primarily in the left lower quadrant.  She is unable to estimate when her LMP is given the fact that she has very irregular periods.  She says that she has had irregular periods for most of her life, and after her renal transplant and pancreas transplant last spring she had maybe about 4 months of having regular periods from August through December, and then after that had not had a period for most of 2022.        MDM: Very pleasant 35 year old female in no acute distress who presented to the emergency department today with history above.  Given her hCG of 14,000 she should likely be in the 6 to 8-week range of her pregnancy.  Her vital signs are unremarkable.  We will get basic lab work-up, and transvaginal ultrasound to evaluate for causes of vaginal bleeding.  Leading differential is some type of abortion though this is incomplete versus inevitable versus threatened abortion versus ectopic pregnancy is yet to be determined.  After the transvaginal ultrasound we will likely proceed with pelvic examination for further evaluation.      ED Course as of 11/02/20 1443   Thu Nov 02, 2020   1130 Patient's hemoglobin is 11.1, this is mildly low, it seems to be around and even above her baseline.  Low concern for severe vaginal hemorrhage at this time.   1148 Patient's BMP within normal limits.   1203 hCG Quant: 14,771.3   1408 Patient's pelvic ultrasound was concerning for ectopic pregnancy versus pregnancy failure, and page OB/GYN for further recommendations and evaluation.   [id number] Discussed with OB/GYN who said that although we cannot rule out an ectopic pregnancy they also think that this is likely a miscarriage.  They will come discuss this with the patient and likely get a repeat hCG in 48 hours.         ____________________________________________    The case was discussed with Dr. Varney Baas* who is in agreement with the above assessment and plan    Dictation software was used while making this note. Please excuse any errors made with dictation software.    Additional Medical Decision Making     I have reviewed  the vital signs and the nursing notes. Labs and radiology results that were available during my care of the patient were independently reviewed by me and considered in my medical decision making.     I independently visualized the EKG tracing if performed  I independently visualized the radiology images if performed  I reviewed the patient's prior medical records if available.  Additional history obtained from family if available  I discussed the case with the admitting provider and the consulting services if the patient was admitted and/or consulting services were utilized.     History       ROS:     Review of Systems  Constitutional: Negative for fever, recent weight loss/weight gain, chills.  Eyes: Negative for visual changes.  ENT: Negative for sore throat.  Cardiovascular: Negative for chest pain  Respiratory: Negative for shortness of breath and cough  Gastrointestinal: Negative for abdominal pain, vomiting, diarrhea.  Genitourinary: Negative for dysuria and urinary frequency.  Musculoskeletal: Negative for back pain.  Skin: Negative for rash.  Neurological: Negative for headaches, focal weakness or numbness.      Past Medical History:   Diagnosis Date   ??? Chronic hypertension during pregnancy, antepartum 07/22/2015    Overview:  Methyldopa recommended per nephrologist if needed   ??? Diabetes mellitus type 1 (CMS-HCC)    ??? ESRD (end stage renal disease) (CMS-HCC)    ??? History of pre-eclampsia 10/24/2016   ??? 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 AV FISTULA PLACEMENT  2018   ??? CESAREAN SECTION     ??? PR COLONOSCOPY FLX DX W/COLLJ SPEC WHEN PFRMD N/A 02/14/2020    Procedure: COLONOSCOPY, FLEXIBLE, PROXIMAL TO SPLENIC FLEXURE; DIAGNOSTIC, W/WO COLLECTION SPECIMEN BY BRUSH OR WASH;  Surgeon: Carmon Ginsberg, MD;  Location: [facility];  Service: Gastroenterology   ??? PR COLONOSCOPY FLX DX W/COLLJ SPEC WHEN PFRMD N/A 06/19/2020    Procedure: COLONOSCOPY, FLEXIBLE, PROXIMAL TO SPLENIC FLEXURE; DIAGNOSTIC, W/WO COLLECTION SPECIMEN BY BRUSH OR WASH;  Surgeon: Carmon Ginsberg, MD;  Location: [facility];  Service: Gastroenterology   ??? PR FECAL MICROBIOTA PREP INSTIL N/A 02/14/2020    Procedure: PREP W INSTILLATION FECAL MICROBIOTA, ANY METHOD;  Surgeon: Carmon Ginsberg, MD;  Location: [facility];  Service: Gastroenterology   ??? PR PREPARE FECAL MICROBIOTA FOR INSTILLATION  06/19/2020    Procedure: PREP FECAL MICROBIOTA FOR INSTILLATION, INCLUDING ASSESSMENT OF DONOR SPECIMEN;  Surgeon: Carmon Ginsberg, MD;  Location: [facility];  Service: Gastroenterology   ??? PR TRANSPLANT ALLOGRAFT PANCREAS N/A 05/03/2019    Procedure: TRANSPLANTATION OF PANCREATIC ALLOGRAFT;  Surgeon: Leona Carry, MD;  Location: MAIN OR [facility];  Service: Transplant   ??? PR TRANSPLANT,PREP CADAVER RENAL GRAFT N/A 05/03/2019    Procedure: [facility] STD PREP CAD DONR RENAL ALLOGFT PRIOR TO TRNSPLNT, INCL DISSEC/REM PERINEPH FAT, DIAPH/RTPER ATTAC;  Surgeon: Leona Carry, MD;  Location: [facility] OR [facility];  Service: Transplant   ??? PR TRANSPLANT,PREP DONOR PANCREAS N/A 05/03/2019    Procedure: [facility] STANDARD PREPARATION OF CADAVER DONOR PANCREAS ALLOGRAFT PRIOR TO TRANSPLANTATION;  Surgeon: Leona Carry, MD;  Location: MAIN OR [facility];  Service: Transplant   ??? PR TRANSPLANTATION OF KIDNEY N/A 05/03/2019    Procedure: RENAL ALLOTRANSPLANTATION, IMPLANTATION OF GRAFT; WITHOUT RECIPIENT NEPHRECTOMY;  Surgeon: Leona Carry, MD;  Location: MA[facility] OR [facility];  Service:  Transplant   ??? TONSILLECTOMY           Current Facility-Administered Medications:   ???  sodium chloride 0.9% (NS) bolus 1,000 mL, 1,000 mL, Intravenous, Once, Marjory Sneddon, MD    Current Outpatient Medications:   ???  aspirin (ECOTRIN) 81 MG tablet, Take 1 tablet (81 mg total) by mouth daily., Disp: 30 tablet, Rfl: 11  ???  azaTHIOprine (IMURAN) 50 mg tablet, Take 2 tablets (100 mg total) by mouth daily. Z94.0, Disp: 60 tablet, Rfl: 11  ???  sodium bicarbonate 650 mg tablet, Take 2 tablets (1,300 mg total) by mouth Three (3) times a day., Disp: 180 tablet, Rfl: 3  ???  tacrolimus (ENVARSUS XR) 4 mg Tb24 extended release tablet, Take 4 tablets (16 mg total) by mouth daily., Disp: 120 tablet, Rfl: 11    Allergies  Iodinated contrast media, Nickel, Propranolol, Eye irrigating solution [ophthalmic irrigation solution], Iodine, Naltrexone, and Uni-cortrom    Family History   Problem Relation Age of Onset   ??? Diabetes Mother    ??? Diabetes Father    ??? Cancer Maternal Grandmother    ??? Diabetes Maternal Grandfather    ??? Diabetes Paternal Grandmother        Social History  Social History     Tobacco Use   ??? Smoking status: Never Smoker   ??? Smokeless tobacco: Never Used   Vaping Use   ??? Vaping Use: Never used   Substance Use Topics   ??? Alcohol use: Never   ??? Drug use: Never        Physical Exam     VITAL SIGNS:      Vitals:    11/02/20 1019 11/02/20 1110   BP: 124/80 140/74   Pulse: 72 78   Resp: 18 15   Temp: 36.6 ??C (97.9 ??F)    TempSrc: Oral    SpO2: 99% 100%       Constitutional: Alert and oriented. Well appearing and in no distress.  Eyes: Conjunctivae are normal.  Mouth/Throat: Mucous membranes are moist. No oropharyngeal exudate or erythema.  Cardiovascular: Normal rate, regular rhythm. No murmurs, rubs, or gallops appreciated.  Respiratory: Normal respiratory effort. Breath sounds are normal. No adventitious breath sounds.  Gastrointestinal: Soft and nontender to palpation. No rebound or guarding.   Genitourinary: No suprapubic tenderness.   Musculoskeletal: Normal range of motion in all extremities. No obvious deformity in any extremity.  Neurologic: Appearance without facial droop, language without expressive or receptive aphasia. Moves all extremities equally. No gross focal neurologic deficits are appreciated.   Skin: Skin is warm, dry and intact. No rash noted. No obvious skin breakdown.        Radiology     US Pelvic (Transvaginal) - PREGNANCY RELATED   Final Result   Irregularly shaped heterogeneous cystic collection in the uterine fundus. No definite embryo or yolk sac is seen. This is a pregnancy of unknown location and the differential diagnosis includes early pregnancy, pregnancy failure and ectopic pregnancy cannot be excluded. If this is an early pregnancy, the irregular shape of the gestational sac is worrisome for pregnancy failure. Correlation with serial physical exam, serial beta hCG, ectopic precautions and follow-up ultrasound until a diagnosis is made is recommended.      Small amount of free fluid in the pelvis.      Spectral doppler was performed on bilateral ovaries.   Additional transabdominal images were not obtained.       Please see below for  data measurements:         LMP: UNSURE   HCG 14771.3       Yolk sac NOT WELL SEEN    Gestational sac 1.48 cm   Crown rump length 0.46 cm   Cardiac activity NO   FHR: NO      Uterus: Sagittal 10.1 cm; AP 5.6 cm; Transverse 6.0 cm    Endometrium: 4.2 cm   Right ovary: Sagittal 5.1 cm; AP 1.9 cm; Transverse 1.6 cm   Left ovary: Sagittal 3.6 cm; AP 2.5 cm; Transverse 1.9 cm       Free fluid visualized:yes               Laboratory Data     Lab Results   Component Value Date    WBC 7.0 11/02/2020    HGB 11.1 (L) 11/02/2020    HCT 31.9 (L) 11/02/2020    PLT 248 11/02/2020       Lab Results   Component Value Date    NA 137 11/02/2020    K 4.7 11/02/2020    CL 110 (H) 11/02/2020    CO2 22.0 11/02/2020    BUN 13 11/02/2020    CREATININE 1.39 (H) 11/02/2020    GLU 97 11/02/2020    CALCIUM 9.2 11/02/2020    MG 1.7 10/30/2020    PHOS 2.5 09/07/2020       Lab Results   Component Value Date    BILITOT 0.7 09/07/2020    BILIDIR 0.20 02/20/2020    PROT 7.1 09/07/2020    ALBUMIN 3.7 09/07/2020    ALT 31 09/07/2020    AST 25 09/07/2020    ALKPHOS 64 09/07/2020    GGT 18 05/02/2019       Lab Results   Component Value Date    INR 1.08 02/17/2020    APTT 34.3 02/17/2020       Pertinent labs & imaging results that were available during my care of the patient were reviewed by me and considered in my medical decision making (see chart for details).    Portions of this record have been created using Scientist, clinical (histocompatibility and immunogenetics). Dictation errors have been sought, but may not have been identified and corrected.    Anitra Lauth, MD  EM     Len Blalock Cathlean Marseilles., MD  Resident  11/02/20 [phone]

## 2020-11-02 NOTE — Unmapped (Signed)
Pt had positive pregnancy test Tuesday, confirmed by OB with labwork (unsure how far along, unknown LMP). Pt presents today with vaginal bleeding (dark red, medium flow), pelvic cramping and back pain. Hx kidney & pancreas transplant

## 2020-11-02 NOTE — Unmapped (Signed)
Gynecology Consult Note    Requesting Attending Physician: Blima Singer, MD  Service Requesting Consult: Emergency Medicine    ASSESSMENT AND PLAN     35 y.o. y/o G2P0101 at Unknown with Patient's last menstrual period was 05/11/2020. with findings consistent with PUL.     Pregnancy of Unknown Location:   - Hemodynamically stable patient  - Blood type O POS  - 9/20 HCG at OSH >14,000 per patient  - HCG today 14,771  - TVUS with Irregularly shaped heterogeneous cystic collection in the uterine fundus. No definite embryo or yolk sac is seen.  - Concern for likely SAB although still technically PUL  - Recommend quantitative bHCG in 48 hours. Patient would like to follow up at [facility] since her primary OBGYN does not manage her high risk pregnancies. Future lab ordered and lab information given in discharge instructions.  - The patient will be added to the beta list for follow up by the GYN team.  - If beta is rising appropriately and symptoms resolve, the patient will need a follow up ultrasound in 2 weeks to confirm location and viability of pregnancy.   - Strict ectopic precautions were reviewed with the patient.    Rh Status: Positive, no rhogam indicated    Thank you for involving [place] in the care of this patient. Please page the Benign Gynecology service at [phone] with any further questions or concerns.    Attending Dr. Neysa Hotter was immediately available.    SUBJECTIVE     CC:   Chief Complaint   Patient presents with   ??? Vaginal Bleeding       HPI:  Elaine Koch is a 35 y.o. y/o G2P0101 with a past medical history of type 1 diabetes and history of a kidney and pancreas transplant who presented to the emergency department today with vaginal bleeding.      Patient reports a history of infrequent menses, last menses maybe in May. She had a positive home pregnancy test on Monday. She went to a local OBGYN [facility] in [place]) on Tuesday for confirmation. She had a positive pregnancy test and reports an HCG level of over 14,000 at that time. Yesterday she saw faint blood on tissue paper with wiping and light cramps. Today the blood is more red. It was more in amount but it is still only with wiping. She had a stomach ache earlier, which is now resolved.     This is a unplanned and desired pregnancy.    Medications    Current Facility-Administered Medications:   ???  sodium chloride 0.9% (NS) bolus 1,000 mL, 1,000 mL, Intravenous, Once, Marjory Sneddon, MD, Last Rate: 1,000 mL/hr at 11/02/20 1503, 1,000 mL at 11/02/20 1503    Current Outpatient Medications:   ???  aspirin (ECOTRIN) 81 MG tablet, Take 1 tablet (81 mg total) by mouth daily., Disp: 30 tablet, Rfl: 11  ???  azaTHIOprine (IMURAN) 50 mg tablet, Take 2 tablets (100 mg total) by mouth daily. Z94.0, Disp: 60 tablet, Rfl: 11  ???  sodium bicarbonate 650 mg tablet, Take 2 tablets (1,300 mg total) by mouth Three (3) times a day., Disp: 180 tablet, Rfl: 3  ???  tacrolimus (ENVARSUS XR) 4 mg Tb24 extended release tablet, Take 4 tablets (16 mg total) by mouth daily., Disp: 120 tablet, Rfl: 11    Allergies  Allergies   Allergen Reactions   ??? Iodinated Contrast Media Swelling, Rash and Other (See Comments)  Burning, warmth throughout body and tingling.  Throat swelling.  Treated with benadryl and symptoms improved.  Has not had contrast since then (2013 or 2014).   ??? Nickel Rash   ??? Propranolol Swelling   ??? Eye Irrigating Solution [Ophthalmic Irrigation Solution] Other (See Comments)     Contrast dye (name?) used in eyes caused hot feeling in face, reversed with Benadryl.    ??? Iodine      Other reaction(s): Skin Rash   ??? Naltrexone Rash   ??? Uni-Cortrom Rash       Past Medical History  Past Medical History:   Diagnosis Date   ??? Chronic hypertension during pregnancy, antepartum 07/22/2015    Overview:  Methyldopa recommended per nephrologist if needed   ??? Diabetes mellitus type 1 (CMS-HCC)    ??? ESRD (end stage renal disease) (CMS-HCC)    ??? History of pre-eclampsia 10/24/2016   ??? 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 AV FISTULA PLACEMENT  2018   ??? CESAREAN SECTION     ??? PR COLONOSCOPY FLX DX W/COLLJ SPEC WHEN PFRMD N/A 02/14/2020    Procedure: COLONOSCOPY, FLEXIBLE, PROXIMAL TO SPLENIC FLEXURE; DIAGNOSTIC, W/WO COLLECTION SPECIMEN BY BRUSH OR WASH;  Surgeon: Carmon Ginsberg, MD;  [facility];  Service: Gastroenterology   ??? PR COLONOSCOPY FLX DX W/COLLJ SPEC WHEN PFRMD N/A 06/19/2020    Procedure: COLONOSCOPY, FLEXIBLE, PROXIMAL TO SPLENIC FLEXURE; DIAGNOSTIC, W/WO COLLECTION SPECIMEN BY BRUSH OR WASH;  Surgeon: Carmon Ginsberg, MD;  Location: [facility];  Service: Gastroenterology   ??? PR FECAL MICROBIOTA PREP INSTIL N/A 02/14/2020    Procedure: PREP W INSTILLATION FECAL MICROBIOTA, ANY METHOD;  Surgeon: Carmon Ginsberg, MD;  Location[facility];  Service: Gastroenterology   ??? PR PREPARE FECAL MICROBIOTA FOR INSTILLATION  06/19/2020    Procedure: PREP FECAL MICROBIOTA FOR INSTILLATION, INCLUDING ASSESSMENT OF DONOR SPECIMEN;  Surgeon: Carmon Ginsberg, MD;  Location: [facility];  Service: Gastroenterology   ??? PR TRANSPLANT ALLOGRAFT PANCREAS N/A 05/03/2019    Procedure: TRANSPLANTATION OF PANCREATIC ALLOGRAFT;  Surgeon: Leona Carry, MD;  Location: MAIN OR [facility];  Service: Transplant   ??? PR TRANSPLANT,PREP CADAVER RENAL GRAFT N/A 05/03/2019    Procedure: [facility] STD PREP CAD DONR RENAL ALLOGFT PRIOR TO TRNSPLNT, INCL DISSEC/REM PERINEPH FAT, DIAPH/RTPER ATTAC;  Surgeon: Leona Carry, MD;  Location: MAIN OR [facility];  Service: Transplant   ??? PR TRANSPLANT,PREP DONOR PANCREAS N/A 05/03/2019    Procedure: [facility] STANDARD PREPARATION OF CADAVER DONOR PANCREAS ALLOGRAFT PRIOR TO TRANSPLANTATION;  Surgeon: Leona Carry, MD; Location: [facility] OR [facility];  Service: Transplant   ??? PR TRANSPLANTATION OF KIDNEY N/A 05/03/2019    Procedure: RENAL ALLOTRANSPLANTATION, IMPLANTATION OF GRAFT; WITHOUT RECIPIENT NEPHRECTOMY;  Surgeon: Leona Carry, MD;  Location: MAIN OR [place];  Service: Transplant   ??? TONSILLECTOMY         OB History  OB History   Gravida Para Term Preterm AB Living   2 1 0 1 0 1   SAB IAB Ectopic Molar Multiple Live Births   0 0 0 0 0 1      # Outcome Date GA Lbr Len/2nd Weight Sex Delivery Anes PTL Lv   2 Current            1 Preterm 2017 [redacted]w[redacted]d  CS-Unspec   LIV       Social History  Social History     Social History Narrative   ??? Not on file       Family History  Family History   Problem Relation Age of Onset   ??? Diabetes Mother    ??? Diabetes Father    ??? Cancer Maternal Grandmother    ??? Diabetes Maternal Grandfather    ??? Diabetes Paternal Grandmother        Review of Systems  Otherwise negative across the balance of 10 systems except as noted per HPI.    OBJECTIVE     Physical Exam  BP 140/74  - Pulse 78  - Temp 36.6 ??C (97.9 ??F) (Oral)  - Resp 15  - LMP 05/11/2020  - SpO2 100%   - General: NAD.  The patient is awake and alert.  - Lungs: There is a normal work of breathing.  - Extremities: No clubbing or edema.  No rash.  - Psych: Appropriate patient interaction and affect.  The patient is appropriately addressed and interactive.    LABS AND IMAGING     hCG Quantitative   Date Value Ref Range Status   11/02/2020 14,771.3 mIU/mL Final     WBC   Date Value Ref Range Status   11/02/2020 7.0 3.6 - 11.2 10*9/L Final   10/30/2020 6.7 3.4 - 10.8 x10E3/uL Final     HGB   Date Value Ref Range Status   11/02/2020 11.1 (L) 11.3 - 14.9 g/dL Final   57/84/6962 95.2 (L) 11.1 - 15.9 g/dL Final     HCT   Date Value Ref Range Status   11/02/2020 31.9 (L) 34.0 - 44.0 % Final   10/30/2020 31.7 (L) 34.0 - 46.6 % Final     Platelet   Date Value Ref Range Status   11/02/2020 248 150 - 450 10*9/L Final   10/30/2020 280 150 - 450 x10E3/uL Final O POS  US Pelvic (Transvaginal) - PREGNANCY RELATED  Narrative: Francia Greaves: US OB TRANSVAGINAL  DATE: 11/02/2020 1:05 PM  ACCESSION: [id number]  DICTATED: 11/02/2020 1:07 PM  INTERPRETATION LOCATION: [facility]    CLINICAL INDICATION: vaginal bleeding, abd pain pregnant      COMPARISON: CT abdomen and pelvis without contrast 02/17/2020    TECHNIQUE: Ultrasound views of the pelvis were obtained endovaginally using gray scale and color Doppler imaging. Spectral Doppler was also performed.    FINDINGS:     Irregularly-shaped heterogeneous cystic area in the uterine fundus measuring 2.0 x 1.8 x 1.7 cm. No definite yolk sac or embryo is identified.    There is a thickened and irregular portion of the endometrium with small cystic areas in the mid uterus.    OVARIES: The ovaries were seen well transvaginally. Small cystic areas were seen within both ovaries compatible with follicles. Appropriate arterial inflow and venous outflow was seen in both ovaries with color and spectral  doppler. A dominant follicle measures 1.8 x 1.8 x 1.5 cm in the right ovary.     OTHER: Small amount of pelvic free fluid.  Impression: Irregularly shaped heterogeneous cystic collection in the uterine fundus. No definite embryo or yolk sac is seen. This is a pregnancy of unknown location and the differential diagnosis includes early pregnancy, pregnancy failure and ectopic pregnancy cannot be excluded. If this is an early pregnancy, the irregular shape of the gestational sac is worrisome for pregnancy failure. Correlation with serial physical exam, serial beta hCG, ectopic precautions  and follow-up ultrasound until a diagnosis is made is recommended.    Small amount of free fluid in the pelvis.    Spectral doppler was performed on bilateral ovaries.  Additional transabdominal images were not obtained.     Please see below for data measurements:    LMP: UNSURE  HCG 14771.3     Yolk sac NOT WELL SEEN   Gestational sac 1.48 cm  Crown rump length 0.46 cm  Cardiac activity NO  FHR: NO    Uterus: Sagittal 10.1 cm; AP 5.6 cm; Transverse 6.0 cm   Endometrium: 4.2 cm  Right ovary: Sagittal 5.1 cm; AP 1.9 cm; Transverse 1.6 cm  Left ovary: Sagittal 3.6 cm; AP 2.5 cm; Transverse 1.9 cm     Free fluid visualized:yes

## 2020-11-06 NOTE — Unmapped (Signed)
Reminder call place for a beta lab draw, no answer, no voicemail setup.

## 2020-11-08 DIAGNOSIS — O3680X0 Pregnancy with inconclusive fetal viability, not applicable or unspecified: Principal | ICD-10-CM

## 2020-11-08 NOTE — Unmapped (Signed)
Spoke to patient. She had increase in bleeding and clots after her ED visit. It has decreased to moderate bleeding now. Advised likely SAB. Recommend repeat HCG as a precaution - patient would like to get that with [facility] but cannot come until Monday 10/3. Plan to follow up at that time. Reviewed precautions.

## 2020-11-08 NOTE — Unmapped (Signed)
PC to patient. NA and VM not set up.

## 2020-11-10 NOTE — Unmapped (Signed)
[facility] Refill Coordination Note    Specialty Medication(s) to be Shipped:   Transplant: Envarsus 4mg     Other medication(s) to be shipped: aspirin and sodium bicarb     Elaine Koch, DOB: 02-05-1986  Phone: [phone] (home)       All above HIPAA information was verified with patient.     Was a Nurse, learning disability used for this call? No    Completed refill call assessment today to schedule patient's medication shipment from the [facility] [phone]).  All relevant notes have been reviewed.     Specialty medication(s) and dose(s) confirmed: Regimen is correct and unchanged.   Changes to medications: Sunaina reports no changes at this time.  Changes to insurance: No  New side effects reported not previously addressed with a pharmacist or physician: None reported  Questions for the pharmacist: No    Confirmed patient received a Conservation officer, historic buildings and a Surveyor, mining with first shipment. The patient will receive a drug information handout for each medication shipped and additional FDA Medication Guides as required.       DISEASE/MEDICATION-SPECIFIC INFORMATION        N/A    SPECIALTY MEDICATION ADHERENCE     Medication Adherence    Patient reported X missed doses in the last month: 0  Specialty Medication: Envarsus 4mg   Patient is on additional specialty medications: No          Were doses missed due to medication being on hold? No    Envarsus 4 mg: 8 days of medicine on hand     REFERRAL TO PHARMACIST     Referral to the pharmacist: Not needed      [facility]     Shipping address confirmed in Epic.     Delivery Scheduled: Yes, Expected medication delivery date: 11/17/2020.     Medication will be delivered via UPS to the prescription address in Epic WAM.    Lorelei Pont [facility] Specialty Technician

## 2020-11-14 DIAGNOSIS — O3680X0 Pregnancy with inconclusive fetal viability, not applicable or unspecified: Principal | ICD-10-CM

## 2020-11-14 DIAGNOSIS — O039 Complete or unspecified spontaneous abortion without complication: Principal | ICD-10-CM

## 2020-11-14 NOTE — Unmapped (Signed)
Spoke to patient. She has been unable to get the lab due to work obligations but she gets out early 10/6 and can follow up then. She desires to f/u with [facility] for this and desires ultimate to transfer GYN care to [facility] as well. She is still lightly bleeding (1 tampon/day).     Will watch hcg on 10/6 (order already placed) and will place referral to gyn.

## 2020-11-16 ENCOUNTER — Encounter (HOSPITAL_COMMUNITY): Payer: Self-pay | Admitting: Emergency Medicine

## 2020-11-16 ENCOUNTER — Inpatient Hospital Stay (HOSPITAL_COMMUNITY)
Admission: EM | Admit: 2020-11-16 | Discharge: 2020-11-17 | Disposition: A | Discharge status: Home / Self Care | Payer: Medicare Other | Attending: Obstetrics and Gynecology | Admitting: Obstetrics and Gynecology

## 2020-11-16 DIAGNOSIS — O139 Gestational [pregnancy-induced] hypertension without significant proteinuria, unspecified trimester: Secondary | ICD-10-CM | POA: Diagnosis not present

## 2020-11-16 DIAGNOSIS — Z7982 Long term (current) use of aspirin: Secondary | ICD-10-CM | POA: Insufficient documentation

## 2020-11-16 DIAGNOSIS — Z20822 Contact with and (suspected) exposure to covid-19: Secondary | ICD-10-CM | POA: Insufficient documentation

## 2020-11-16 DIAGNOSIS — O10213 Pre-existing hypertensive chronic kidney disease complicating pregnancy, third trimester: Secondary | ICD-10-CM | POA: Diagnosis not present

## 2020-11-16 DIAGNOSIS — E109 Type 1 diabetes mellitus without complications: Secondary | ICD-10-CM | POA: Diagnosis not present

## 2020-11-16 DIAGNOSIS — Z794 Long term (current) use of insulin: Secondary | ICD-10-CM | POA: Diagnosis not present

## 2020-11-16 DIAGNOSIS — O10211 Pre-existing hypertensive chronic kidney disease complicating pregnancy, first trimester: Secondary | ICD-10-CM | POA: Diagnosis not present

## 2020-11-16 DIAGNOSIS — Z79899 Other long term (current) drug therapy: Secondary | ICD-10-CM | POA: Insufficient documentation

## 2020-11-16 DIAGNOSIS — N186 End stage renal disease: Secondary | ICD-10-CM | POA: Diagnosis not present

## 2020-11-16 DIAGNOSIS — Z992 Dependence on renal dialysis: Secondary | ICD-10-CM | POA: Insufficient documentation

## 2020-11-16 DIAGNOSIS — O009 Unspecified ectopic pregnancy without intrauterine pregnancy: Secondary | ICD-10-CM | POA: Diagnosis not present

## 2020-11-16 DIAGNOSIS — O26891 Other specified pregnancy related conditions, first trimester: Secondary | ICD-10-CM | POA: Diagnosis present

## 2020-11-16 DIAGNOSIS — I120 Hypertensive chronic kidney disease with stage 5 chronic kidney disease or end stage renal disease: Secondary | ICD-10-CM | POA: Diagnosis not present

## 2020-11-16 DIAGNOSIS — O24111 Pre-existing diabetes mellitus, type 2, in pregnancy, first trimester: Secondary | ICD-10-CM | POA: Diagnosis not present

## 2020-11-16 LAB — CBC WITH DIFFERENTIAL/PLATELET
Abs Immature Granulocytes: 0.08 10*3/uL — ABNORMAL HIGH (ref 0.00–0.07)
Basophils Absolute: 0.1 10*3/uL (ref 0.0–0.1)
Basophils Relative: 0 %
Eosinophils Absolute: 0.3 10*3/uL (ref 0.0–0.5)
Eosinophils Relative: 3 %
HCT: 27.4 % — ABNORMAL LOW (ref 36.0–46.0)
Hemoglobin: 9.5 g/dL — ABNORMAL LOW (ref 12.0–15.0)
Immature Granulocytes: 1 %
Lymphocytes Relative: 6 %
Lymphs Abs: 0.7 10*3/uL (ref 0.7–4.0)
MCH: 31.1 pg (ref 26.0–34.0)
MCHC: 34.7 g/dL (ref 30.0–36.0)
MCV: 89.8 fL (ref 80.0–100.0)
Monocytes Absolute: 0.7 10*3/uL (ref 0.1–1.0)
Monocytes Relative: 5 %
Neutro Abs: 10.7 10*3/uL — ABNORMAL HIGH (ref 1.7–7.7)
Neutrophils Relative %: 85 %
Platelets: 278 10*3/uL (ref 150–400)
RBC: 3.05 MIL/uL — ABNORMAL LOW (ref 3.87–5.11)
RDW: 12.4 % (ref 11.5–15.5)
WBC: 12.5 10*3/uL — ABNORMAL HIGH (ref 4.0–10.5)
nRBC: 0 % (ref 0.0–0.2)

## 2020-11-16 LAB — URINALYSIS, ROUTINE W REFLEX MICROSCOPIC
Bacteria, UA: NONE SEEN
Bilirubin Urine: NEGATIVE
Glucose, UA: NEGATIVE mg/dL
Ketones, ur: NEGATIVE mg/dL
Leukocytes,Ua: NEGATIVE
Nitrite: NEGATIVE
Protein, ur: 100 mg/dL — AB
RBC / HPF: >50 RBC/hpf — ABNORMAL HIGH (ref 0–5)
Specific Gravity, Urine: 1.013 (ref 1.005–1.030)
pH: 7 (ref 5.0–8.0)

## 2020-11-16 LAB — COMPREHENSIVE METABOLIC PANEL
ALT: 22 U/L (ref 0–44)
AST: 24 U/L (ref 15–41)
Albumin: 4.2 g/dL (ref 3.5–5.0)
Alkaline Phosphatase: 46 U/L (ref 38–126)
Anion gap: 8 (ref 5–15)
BUN: 28 mg/dL — ABNORMAL HIGH (ref 6–20)
CO2: 23 mmol/L (ref 22–32)
Calcium: 9.8 mg/dL (ref 8.9–10.3)
Chloride: 107 mmol/L (ref 98–111)
Creatinine, Ser: 1.74 mg/dL — ABNORMAL HIGH (ref 0.44–1.00)
GFR, Estimated: 39 mL/min — ABNORMAL LOW (ref >60)
Glucose, Bld: 119 mg/dL — ABNORMAL HIGH (ref 70–99)
Potassium: 4.3 mmol/L (ref 3.5–5.1)
Sodium: 138 mmol/L (ref 135–145)
Total Bilirubin: 0.9 mg/dL (ref 0.3–1.2)
Total Protein: 7.8 g/dL (ref 6.5–8.1)

## 2020-11-16 LAB — HCG, QUANTITATIVE, PREGNANCY: hCG, Beta Chain, Quant, S: 19019 m[IU]/mL — ABNORMAL HIGH (ref <5)

## 2020-11-16 MED FILL — ENVARSUS XR 4 MG TABLET,EXTENDED RELEASE: ORAL | 30 days supply | Qty: 120 | Fill #1

## 2020-11-16 MED FILL — SODIUM BICARBONATE 650 MG TABLET: ORAL | 30 days supply | Qty: 180 | Fill #1

## 2020-11-16 MED FILL — ASPIRIN 81 MG TABLET,DELAYED RELEASE: ORAL | 30 days supply | Qty: 30 | Fill #6

## 2020-11-16 NOTE — ED Provider Notes (Signed)
Emergency Medicine Provider Triage Evaluation Note  Christine Reed , a 35 y.o. female  was evaluated in triage.  Pt complains of vaginal bleeding, nausea, vomiting, pelvic pain.  Symptoms started about 2 weeks ago.  She was initially seen in the emergency department on September 22 and diagnosed with possible miscarriage versus ectopic pregnancy.  She was evaluated by obstetrics during this visit and they recommended that she follow-up in 2 days for repeat of her hCG.  Patient has not followed up regarding this.  She states that she has continued to have bleeding and pelvic pain.  She states she is soaking about 1 pad per day.  Reports associated nausea/vomiting.  States she has irregular periods and is unsure of the timing of her last menstrual period.  Physical Exam  BP (!) 159/100 (BP Location: Left Arm)   Pulse 91   Temp 98.1 F (36.7 C) (Oral)   Resp 17   Ht '5\' 5"'$  (1.651 m)   Wt 81.6 kg   SpO2 100%   BMI 29.95 kg/m  Gen:   Awake, intermittently retching during the exam Resp:  Normal effort  MSK:   Moves extremities without difficulty  Other:    Medical Decision Making  Medically screening exam initiated at 9:46 PM.  Appropriate orders placed.  Jakaya Scheetz Torain was informed that the remainder of the evaluation will be completed by another provider, this initial triage assessment does not replace that evaluation, and the importance of remaining in the ED until their evaluation is complete.   Rayna Sexton, PA-C 11/16/20 2151    Charlesetta Shanks, MD 11/27/20 1536

## 2020-11-16 NOTE — ED Triage Notes (Addendum)
Pt BIB EMS from home c/o Abd pain and n/v x 2 hours. Hx of miscarriage x 1 week ago.

## 2020-11-17 ENCOUNTER — Encounter (HOSPITAL_COMMUNITY)
Admission: EM | Disposition: A | Discharge status: Home / Self Care | Payer: Self-pay | Source: Home / Self Care | Attending: Emergency Medicine

## 2020-11-17 ENCOUNTER — Emergency Department (HOSPITAL_COMMUNITY): Payer: Medicare Other

## 2020-11-17 ENCOUNTER — Encounter (HOSPITAL_COMMUNITY): Payer: Self-pay | Admitting: Anesthesiology

## 2020-11-17 ENCOUNTER — Encounter (HOSPITAL_COMMUNITY): Payer: Self-pay | Admitting: *Deleted

## 2020-11-17 ENCOUNTER — Encounter: Admit: 2020-11-17 | Discharge: 2020-11-18 | Payer: MEDICARE | Attending: Anesthesiology | Primary: Anesthesiology

## 2020-11-17 ENCOUNTER — Ambulatory Visit: Admit: 2020-11-17 | Discharge: 2020-11-18 | Payer: MEDICARE

## 2020-11-17 ENCOUNTER — Encounter: Admit: 2020-11-17 | Discharge: 2020-11-18 | Payer: MEDICARE

## 2020-11-17 DIAGNOSIS — O3680X0 Pregnancy with inconclusive fetal viability, not applicable or unspecified: Principal | ICD-10-CM

## 2020-11-17 DIAGNOSIS — O009 Unspecified ectopic pregnancy without intrauterine pregnancy: Secondary | ICD-10-CM | POA: Diagnosis not present

## 2020-11-17 DIAGNOSIS — Z79899 Other long term (current) drug therapy: Secondary | ICD-10-CM | POA: Diagnosis not present

## 2020-11-17 DIAGNOSIS — O26891 Other specified pregnancy related conditions, first trimester: Secondary | ICD-10-CM | POA: Diagnosis present

## 2020-11-17 DIAGNOSIS — N186 End stage renal disease: Secondary | ICD-10-CM | POA: Diagnosis not present

## 2020-11-17 DIAGNOSIS — E109 Type 1 diabetes mellitus without complications: Secondary | ICD-10-CM | POA: Diagnosis not present

## 2020-11-17 DIAGNOSIS — Z7982 Long term (current) use of aspirin: Secondary | ICD-10-CM | POA: Diagnosis not present

## 2020-11-17 DIAGNOSIS — O24111 Pre-existing diabetes mellitus, type 2, in pregnancy, first trimester: Secondary | ICD-10-CM | POA: Diagnosis not present

## 2020-11-17 DIAGNOSIS — O10211 Pre-existing hypertensive chronic kidney disease complicating pregnancy, first trimester: Secondary | ICD-10-CM | POA: Diagnosis not present

## 2020-11-17 DIAGNOSIS — Z794 Long term (current) use of insulin: Secondary | ICD-10-CM | POA: Diagnosis not present

## 2020-11-17 DIAGNOSIS — Z992 Dependence on renal dialysis: Secondary | ICD-10-CM | POA: Diagnosis not present

## 2020-11-17 DIAGNOSIS — O10213 Pre-existing hypertensive chronic kidney disease complicating pregnancy, third trimester: Secondary | ICD-10-CM | POA: Diagnosis not present

## 2020-11-17 DIAGNOSIS — I120 Hypertensive chronic kidney disease with stage 5 chronic kidney disease or end stage renal disease: Secondary | ICD-10-CM | POA: Diagnosis not present

## 2020-11-17 DIAGNOSIS — O139 Gestational [pregnancy-induced] hypertension without significant proteinuria, unspecified trimester: Secondary | ICD-10-CM | POA: Diagnosis not present

## 2020-11-17 DIAGNOSIS — Z20822 Contact with and (suspected) exposure to covid-19: Secondary | ICD-10-CM | POA: Diagnosis not present

## 2020-11-17 LAB — COMPREHENSIVE METABOLIC PANEL
ALBUMIN: 3.7 g/dL (ref 3.4–5.0)
ALKALINE PHOSPHATASE: 46 U/L (ref 46–116)
ALT (SGPT): 18 U/L (ref 10–49)
ANION GAP: 8 mmol/L (ref 5–14)
AST (SGOT): 19 U/L (ref <=34)
BILIRUBIN TOTAL: 0.9 mg/dL (ref 0.3–1.2)
BLOOD UREA NITROGEN: 16 mg/dL (ref 9–23)
BUN / CREAT RATIO: 12
CALCIUM: 9.2 mg/dL (ref 8.7–10.4)
CHLORIDE: 106 mmol/L (ref 98–107)
CO2: 24 mmol/L (ref 20.0–31.0)
CREATININE: 1.31 mg/dL — ABNORMAL HIGH
EGFR CKD-EPI (2021) FEMALE: 55 mL/min/{1.73_m2} — ABNORMAL LOW (ref >=60)
GLUCOSE RANDOM: 118 mg/dL (ref 70–179)
POTASSIUM: 4.2 mmol/L (ref 3.4–4.8)
PROTEIN TOTAL: 6.9 g/dL (ref 5.7–8.2)
SODIUM: 138 mmol/L (ref 135–145)

## 2020-11-17 LAB — CBC
HCT: 26.3 % — ABNORMAL LOW (ref 36.0–46.0)
HEMATOCRIT: 26.7 % — ABNORMAL LOW (ref 34.0–44.0)
HEMOGLOBIN: 9.2 g/dL — ABNORMAL LOW (ref 11.3–14.9)
Hemoglobin: 9.3 g/dL — ABNORMAL LOW (ref 12.0–15.0)
MCH: 30.9 pg (ref 26.0–34.0)
MCHC: 35.4 g/dL (ref 30.0–36.0)
MCV: 87.4 fL (ref 80.0–100.0)
MEAN CORPUSCULAR HEMOGLOBIN CONC: 34.4 g/dL (ref 32.0–36.0)
MEAN CORPUSCULAR HEMOGLOBIN: 30.8 pg (ref 25.9–32.4)
MEAN CORPUSCULAR VOLUME: 89.7 fL (ref 77.6–95.7)
MEAN PLATELET VOLUME: 7.7 fL (ref 6.8–10.7)
PLATELET COUNT: 265 10*9/L (ref 150–450)
Platelets: 263 10*3/uL (ref 150–400)
RBC: 3.01 MIL/uL — ABNORMAL LOW (ref 3.87–5.11)
RDW: 12.2 % (ref 11.5–15.5)
RED BLOOD CELL COUNT: 2.97 10*12/L — ABNORMAL LOW (ref 3.95–5.13)
RED CELL DISTRIBUTION WIDTH: 12.9 % (ref 12.2–15.2)
WBC ADJUSTED: 10.3 10*9/L (ref 3.6–11.2)
WBC: 11.8 10*3/uL — ABNORMAL HIGH (ref 4.0–10.5)
nRBC: 0 % (ref 0.0–0.2)

## 2020-11-17 LAB — HCG QUANTITATIVE, BLOOD: GONADOTROPIN, CHORIONIC (HCG) QUANT: 7672.4 m[IU]/mL

## 2020-11-17 LAB — TYPE AND SCREEN
ABO/RH(D): O POS
ABO/RH(D): O POS
Antibody Screen: NEGATIVE
Antibody Screen: NEGATIVE

## 2020-11-17 LAB — RESP PANEL BY RT-PCR (FLU A&B, COVID) ARPGX2
Influenza A by PCR: NEGATIVE
Influenza B by PCR: NEGATIVE
SARS Coronavirus 2 by RT PCR: NEGATIVE

## 2020-11-17 SURGERY — CANCELLED PROCEDURE

## 2020-11-17 MED ORDER — MORPHINE SULFATE (PF) 4 MG/ML IV SOLN
4.0000 mg | Freq: Once | INTRAVENOUS | Status: AC
  Administered 2020-11-17: 4 mg via INTRAVENOUS
  Filled 2020-11-17: qty 1

## 2020-11-17 MED ORDER — ONDANSETRON HCL 4 MG/2ML IJ SOLN
INTRAMUSCULAR | Status: AC
  Filled 2020-11-17: qty 2

## 2020-11-17 MED ORDER — FENTANYL CITRATE (PF) 250 MCG/5ML IJ SOLN
INTRAMUSCULAR | Status: AC
  Filled 2020-11-17: qty 5

## 2020-11-17 MED ORDER — MIDAZOLAM HCL 2 MG/2ML IJ SOLN
INTRAMUSCULAR | Status: AC
  Filled 2020-11-17: qty 2

## 2020-11-17 MED ORDER — CEFAZOLIN SODIUM 1 G IJ SOLR
INTRAMUSCULAR | Status: AC
  Filled 2020-11-17: qty 20

## 2020-11-17 MED ORDER — DEXAMETHASONE SODIUM PHOSPHATE 10 MG/ML IJ SOLN
INTRAMUSCULAR | Status: AC
  Filled 2020-11-17: qty 1

## 2020-11-17 MED ORDER — ONDANSETRON HCL 4 MG/2ML IJ SOLN
4.0000 mg | Freq: Once | INTRAMUSCULAR | Status: AC
  Administered 2020-11-17: 4 mg via INTRAVENOUS
  Filled 2020-11-17: qty 2

## 2020-11-17 MED ORDER — MORPHINE SULFATE (PF) 10 MG/ML IV SOLN
4.0000 mg | Freq: Once | INTRAVENOUS | Status: AC
  Administered 2020-11-17: 4 mg via INTRAVENOUS
  Filled 2020-11-17: qty 1

## 2020-11-17 MED ORDER — LIDOCAINE 2% (20 MG/ML) 5 ML SYRINGE
INTRAMUSCULAR | Status: AC
  Filled 2020-11-17: qty 5

## 2020-11-17 MED ORDER — LACTATED RINGERS IV BOLUS
1000.0000 mL | Freq: Once | INTRAVENOUS | Status: AC
  Administered 2020-11-17: 1000 mL via INTRAVENOUS

## 2020-11-17 MED ORDER — PROPOFOL 10 MG/ML IV BOLUS
INTRAVENOUS | Status: AC
  Filled 2020-11-17: qty 20

## 2020-11-17 MED ORDER — ONDANSETRON 4 MG PO TBDP: 4.0000 mg | ORAL_TABLET | Freq: Once | ORAL | Status: DC

## 2020-11-17 MED ADMIN — propofoL (DIPRIVAN) injection: INTRAVENOUS | @ 19:00:00 | Stop: 2020-11-17

## 2020-11-17 MED ADMIN — fentaNYL (PF) (SUBLIMAZE) injection: INTRAVENOUS | @ 19:00:00 | Stop: 2020-11-17

## 2020-11-17 MED ADMIN — midazolam (VERSED) injection: INTRAVENOUS | @ 18:00:00 | Stop: 2020-11-17

## 2020-11-17 MED ADMIN — fentaNYL (PF) (SUBLIMAZE) injection: INTRAVENOUS | @ 20:00:00 | Stop: 2020-11-17

## 2020-11-17 MED ADMIN — ondansetron (ZOFRAN) injection: INTRAVENOUS | @ 21:00:00 | Stop: 2020-11-17

## 2020-11-17 MED ADMIN — succinylcholine (ANECTINE) injection: INTRAVENOUS | @ 19:00:00 | Stop: 2020-11-17

## 2020-11-17 MED ADMIN — lidocaine (XYLOCAINE) 10 mg/mL (1 %) injection: @ 19:00:00 | Stop: 2020-11-17

## 2020-11-17 MED ADMIN — aspirin chewable tablet 81 mg: 81 mg | ORAL | @ 14:00:00

## 2020-11-17 MED ADMIN — doxycycline (VIBRA-TABS) tablet/capsule 200 mg: 200 mg | ORAL | @ 14:00:00 | Stop: 2020-11-17

## 2020-11-17 MED ADMIN — propofoL (DIPRIVAN) injection: INTRAVENOUS | @ 20:00:00 | Stop: 2020-11-17

## 2020-11-17 MED ADMIN — phenylephrine 0.8 mg/10 mL (80 mcg/mL) injection: INTRAVENOUS | @ 19:00:00 | Stop: 2020-11-17

## 2020-11-17 MED ADMIN — dexmedeTOMIDine (PRECEDEX) injection: INTRAVENOUS | @ 20:00:00 | Stop: 2020-11-17

## 2020-11-17 MED ADMIN — sodium bicarbonate tablet 1,300 mg: 1300 mg | ORAL | @ 14:00:00

## 2020-11-17 MED ADMIN — tacrolimus (ENVARSUS XR) extended release tablet 16 mg: 16 mg | ORAL | @ 15:00:00

## 2020-11-17 MED ADMIN — acetaminophen (TYLENOL) tablet 1,000 mg: 1000 mg | ORAL | @ 14:00:00

## 2020-11-17 MED ADMIN — dexamethasone (DECADRON) 4 mg/mL injection: INTRAVENOUS | @ 21:00:00 | Stop: 2020-11-17

## 2020-11-17 MED ADMIN — lactated Ringers infusion: INTRAVENOUS | @ 18:00:00 | Stop: 2020-11-17

## 2020-11-17 MED ADMIN — glycopyrrolate (ROBINUL) injection: INTRAVENOUS | @ 21:00:00 | Stop: 2020-11-17

## 2020-11-17 MED ADMIN — phenylephrine 0.8 mg/10 mL (80 mcg/mL) injection: INTRAVENOUS | @ 20:00:00 | Stop: 2020-11-17

## 2020-11-17 MED ADMIN — azaTHIOprine (IMURAN) tablet 100 mg: 100 mg | ORAL | @ 15:00:00

## 2020-11-17 MED ADMIN — dexamethasone (DECADRON) 4 mg/mL injection: INTRAVENOUS | @ 19:00:00 | Stop: 2020-11-17

## 2020-11-17 MED ADMIN — lactated Ringers infusion: 75 mL/h | INTRAVENOUS | @ 13:00:00 | Stop: 2020-11-17

## 2020-11-17 MED ADMIN — lidocaine (XYLOCAINE) 20 mg/mL (2 %) injection: INTRAVENOUS | @ 19:00:00 | Stop: 2020-11-17

## 2020-11-17 MED ADMIN — ROCuronium (ZEMURON) injection: INTRAVENOUS | @ 20:00:00 | Stop: 2020-11-17

## 2020-11-17 MED ADMIN — neostigmine (BLOXIVERZ) injection: INTRAVENOUS | @ 21:00:00 | Stop: 2020-11-17

## 2020-11-17 NOTE — Unmapped (Signed)
GYNECOLOGY OPERATIVE NOTE    Preoperative Diagnosis:  1) Pregnancy unknown location  2) History of kidney-pancreas transplant  3) Type 1 diabetes mellitus    Postoperative Diagnosis:  1) Likely completed abortion    Procedure: Exam under anesthesia, dilation and curettage under ultrasound guidance, diagnostic laparoscopy    Attending Surgeon: Cleda Mccreedy, MD    Resident Surgeon: Myer Peer, MD    Assistants: None    Anesthesia: General and paracervical block with 10 ml of 1% lidocaine     IVF: 1000 mL    UOP: 700 mL    EBL: 25 mL    Findings:  1. Anteverted uterus  2. Cervix dilated with minimal blood in vaginal vault  3. Minimal blood products suctioned from uterus. Gritty texture of uterus following suction curettage  4. Intra-operative frozen section of uterine contents without chorionic villi  5. Significant intra-abdominal adhesions   6. Normal fallopian tubes and ovaries bilaterally  7. Normal ovaries bilaterally    Indication:  This is a 35 y.o. G2P0101 with history notable for kidney-pancreas transplant who presented with abdominal pain, vaginal bleeding, abnormal HCG trend, and ultrasound findings concerning for ectopic pregnancy versus failed intrauterine pregnancy.  She is aware of the risk of bleeding, infection, damage to surrounding organs, including bowel, bladder, large vessels and ureters. All questions were answered and formal consent was confirmed.    Procedure:  The patient was taken to the operating room.  General endotracheal anesthesia was obtained.  She was placed in low lithotomy position, taking care not to hyperflex or hyperextend the hips or the knees.  Her arms were placed at her side in military position with padding around the elbows and hands.      She was prepped and draped in the normal sterile fashion. An I&O catheterization was performed. A standard timeout was performed. An I&O catheterization was performed.  A speculum was inserted and a paracervical block was performed with 10 ml of 1% lidocaine. A tenaculum was placed on the anterior lip of the cervix. The cervix was dilated to a 23 Pratt. The 8 flexible curette was placed into the uterus and standard suction curettage was performed under ultrasound guidance until gritty texture was noted throughout. The uterine contents were sent for frozen pathology. The speculum and tenaculum were removed from the vagina and hemostasis was noted.     When frozen section returned without villi, decision was made to proceed with diagnostic laparoscopy and transplant surgery was notified.    The patient was again prepped and draped in standard fashion.  A foley catheter was placed in the urinary bladder.    A small incision was made at the midclavicular line in the left upper quadrant. Attempt was made initially to enter with a 5mm laparoscopic port under direct visualization with high opening pressure. Veress entry was attempted unsuccessfully. A second attempt was made to enter under direct visualization and entry into the abdomen was confirmed visually and with low opening pressure.  The abdomen was insufflated with CO2 gas. The patient was placed in Trendelenberg position.  The abdomen was surveyed with significant adhesions noted. A second port was placed suprapubically under direct visualization at an area without bowel adherent to the abdominal wall. Scissors were used to incise a small hole in the filmy adhesions in order to obtain visualization of the uterus. A Zumi manipulator was placed under direct visualization.  A survey of the pelvis revealed the above findings.      The two  ports were removed.  The skin was closed with 4-0 Monocryl in a subcuticular fashion.  Surgical glue was placed over the incisions.  The uterine manipulator was removed from the vagina.      The patient was taken out of lithotomy position, extubated in the operating room and transferred to the recovery room in stable condition.  The sponge, lap and needle counts were correct x 3.      The patient received pre-operative doxycycline.

## 2020-11-17 NOTE — Unmapped (Addendum)
Gynecology Physician Discharge Summary    Admit Date: 11/17/2020    Discharge Date and Time: 11/17/2020 3:15 PM     Discharge to: Home    Discharge Service: Gynecology (GYN)    Discharge Attending Physician: Laural Benes, MD    Discharge Diagnoses:   Principal Problem:    Pregnancy, location unknown  Active Problems:    Anemia, chronic disease    Type 1 diabetes mellitus (CMS-HCC)    Other secondary hypertension    History of simultaneous kidney and pancreas transplant (CMS-HCC)    AKI (acute kidney injury) (CMS-HCC)       Procedures: Exam under anesthesia, dilation and curettage, dx laparoscopy    Hospital Course:   Elaine Koch is a 35yo [id number] with PMH significant for T1DM with kidney-pancreas transplant on immunosuppression who presented with vaginal bleeding and lower abdominal pain the setting of pregnancy unknown location. Her beta trend was notable for beta of 14,771.3 on 9/22 with next beta at OSH 19,000 on 10/7 and beta on admit here 7,672. Outside hospital transvaginal ultrasound with no definitive intrauterine pregnancy and hypoechoic structure in left adnexa. Given abnormal beta trend and ultrasound finding, decision was made to proceed with D&C. Intraoperative frozen section without chorionic villi, concerning for possible ectopic proceed. Diagnostic laparoscopy subsequently performed with normal-appearing fallopian tubes and ovaries bilaterally. She was admitted for observation and repeat beta given abnormal beta trend and concern for possible heterotopic pregnancy. Beta downtrended appropriately to 4031.1 on day of discharge. Her hemoglobin dropped from baseline 11 to 9 but she remained asymptomatic and hemodynamically stable. On the day of discharge, she was meeting all postoperative goals; ambulating, voiding spontaneously, and tolerating a regular diet.  She was discharged to home on POD#1 and will follow up in same day clinic in one week for repeat beta and post-op appointment.     Condition at Discharge: stable    Discharge Day Services:  The patient was seen by the primary team, and deemed ready for discharge.  BP 146/78  - Pulse 83  - Temp 36.6 ??C (97.9 ??F) (Temporal)  - Resp 16  - Ht 165.1 cm (5' 5)  - Wt 85.8 kg (189 lb 2.5 oz)  - LMP 05/11/2020  - SpO2 99%  - BMI 31.48 kg/m??     Gen: no apparent distress  CV: regular rate and rhythm  Pulm: normal work of breathing, clear to auscultation bilaterally  Abd: soft, nontender, nondistended  Inc: laparoscopic incisions clean, dry, intact, covered with surgical glue  Ext: no calf tenderness or edema bilaterally     Discharge Medications:      Your Medication List      ASK your doctor about these medications    aspirin 81 MG tablet  Commonly known as: ECOTRIN  Take 1 tablet (81 mg total) by mouth daily.     azaTHIOprine 50 mg tablet  Commonly known as: IMURAN  Take 2 tablets (100 mg total) by mouth daily. Z94.0     ENVARSUS XR 4 mg Tb24 extended release tablet  Generic drug: tacrolimus  Take 4 tablets (16 mg total) by mouth daily.     sodium bicarbonate 650 mg tablet  Take 2 tablets (1,300 mg total) by mouth Three (3) times a day.            Recent Test Results:   Lab Results   Component Value Date    WBC 10.3 11/17/2020    HGB 9.2 (L) 11/17/2020    HCT 26.7 (  L) 11/17/2020    PLT 265 11/17/2020       Lab Results   Component Value Date    NA 138 11/17/2020    K 4.2 11/17/2020    CL 106 11/17/2020    CO2 24.0 11/17/2020    BUN 16 11/17/2020    CREATININE 1.31 (H) 11/17/2020    GLU 118 11/17/2020    CALCIUM 9.2 11/17/2020    MG 1.7 10/30/2020    PHOS 2.5 09/07/2020       Lab Results   Component Value Date    BILITOT 0.9 11/17/2020    BILIDIR 0.20 02/20/2020    PROT 6.9 11/17/2020    ALBUMIN 3.7 11/17/2020    ALT 18 11/17/2020    AST 19 11/17/2020    ALKPHOS 46 11/17/2020    GGT 18 05/02/2019       Pending Test Results:   Pending Labs     Order Current Status    Surgical pathology exam Collected (11/17/20 1506)          Discharge Instructions: Appointments which have been scheduled for you    Nov 30, 2020  1:30 PM  (Arrive by 1:15 PM)  RETURN NEPHROLOGY POST with Posey Boyer True, MD  [facility] [place] [facility]) [address]  [place] [address]  [phone]

## 2020-11-17 NOTE — Unmapped (Signed)
We reviewed significantly downtrending beta HCG more consistent with SAB than ectopic pregnancy. We will still proceed with D&C but will only proceed with diagnostic laparoscopy if D&C path does not have villi. She understands this plan. Given abnormal beta trend and possibility of heterotopic, will have her stay overnight to ensure continuing downtrending beta tomorrow morning which patient understands. If no villi with D&C, will plan diagnostic laparoscopy.    Patient seen and plan discussed with attending Dr. Erick Blinks.

## 2020-11-17 NOTE — Unmapped (Signed)
Gynecology History and Physical    ASSESSMENT & PLAN     Principal Problem:    Pregnancy, location unknown  Active Problems:    Anemia, chronic disease    Type 1 diabetes mellitus (CMS-HCC)    Other secondary hypertension    History of simultaneous kidney and pancreas transplant (CMS-HCC)    AKI (acute kidney injury) (CMS-HCC)    Elaine Koch is a 35 y.o. 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      Pregnancy, unknown location  - Presented to OSH with lower abdominal pain and ongoing vaginal bleeding  - Concerned for ectopic given beta trend and TVUS findings with lower abdominal pain. Currently HDS with slowly downtrending Hgb, concern for rupture is low.   - Beta trend: 1400+ (9/20 per pt, no record) > 14,771 (9/22) > 19,019 (10/6 at OSH) > admit pending  - TVUS on 9/22 with irregularly shaped heterogenous cystic collection in uterine fundus  - OSH TVUS without IUP with structure in left adnexa, over-read pending  - We reviewed that this is either a failed IUP or an ectopic pregnancy given bleeding, abnormal beta trends, and no clear IUP on TVUS despite HCG well above the discriminatory zone. She understands diagnosis.  - We discussed recommendation for definitive diagnosis with uterine evacuation and if no pregnancy tissue is found, we would recommend proceeding with diagnostic laparoscopy and potential salpingectomy. We reviewed alternative options including expectant management and trending beta again tomorrow - if this is an SAB, would expect a precipitous drop in HCG by tomorrow. We discussed that with her pain, I am concerned for an ectopic and would not recommend expectant management, which she understands. We discussed that if uterine evacuation suggests ectopic pregnancy, this can be managed with methotrexate however methotrexate is less safe with her kidney transplant. She agrees with plan to proceed with exam under anesthesia, uterine suction dilation and curettage, diagnostic laparoscopy, possible removal of fallopian tube, possible removal of ovary.   - We discussed risks including uterine perforation, bleeding, infection, injury to surrounding structures including bowel, bladder, transplanted organs, vessels, nerves. She consents to blood transfusion if needed. All questions addressed. Consents signed and witnessed.   - We discussed contraception given she desires future pregnancy but hopes to optimize with Nephrology first. She is considering hormonal IUD but unsure at this time. Will discuss further at her post-op visit.     Acute on chronic anemia  - Hgb 10.9 on 9/19 > OSH 9.5 > 9.3 > admit 9.2    CKD  - Cr at OSH 1.74 (baseline 1.4-1.7) > admit 1.31    T1DM  - no meds given pancreas transplant  - does not check BG's  - Admit BG 118    Kidney-pancreas transplant  - home tacrolimus and azathioprine  - discussed patient with transplant surgery team who recommend surgical intervention for ectopic pregnancy over methotrexate given kidney transplant. They have little concern for injury to transplanted organs with LUQ laparoscopic entry and pelvic surgery but are available if needed in OR.   - No NSAIDs    PPx: SCDs, ambulation    Patient seen and plan discussed with Attending Dr. Erick Blinks, who is in agreement.    HPI     Chief Complaint: vaginal bleeding, abdominal pain    Elaine Koch is a 35 y.o. G2P0101 with history kidney-pancreas transplant on immunosuppression transferred for management of PUL with abdominal pain  and vaginal bleeding.     She reports irregular menses and unsure LMP, likely some time in June. This was an unplanned pregnancy although she does desire pregnancy was just hoping to have MFM and Nephrology optimization prior to conception. She reports positive home UPT around 9/19 with subsequent light bleeding for which she presented to ED on 9/22. Bleeding remained light until following Friday when it was heavier then subsided. Yesterday, bleeding increased again with accompanying painful cramps that progressed to constant lower abdominal pain, not unilateral as well as nausea and vomiting. She denies lightheadedness or dizziness. Pain is improved since receiving morphine. Has ambulated to bathroom without issue. Bleeding is currently light.     Review of Systems  A comprehensive review of 10 systems was negative except for pertinent positives noted in HPI.    Medications    Medications Prior to Admission   Medication Sig Dispense Refill Last Dose   ??? aspirin (ECOTRIN) 81 MG tablet Take 1 tablet (81 mg total) by mouth daily. 30 tablet 11    ??? azaTHIOprine (IMURAN) 50 mg tablet Take 2 tablets (100 mg total) by mouth daily. Z94.0 60 tablet 11    ??? sodium bicarbonate 650 mg tablet Take 2 tablets (1,300 mg total) by mouth Three (3) times a day. 180 tablet 3    ??? tacrolimus (ENVARSUS XR) 4 mg Tb24 extended release tablet Take 4 tablets (16 mg total) by mouth daily. 120 tablet 11        Allergies   Iodinated contrast media, Nickel, Propranolol, Eye irrigating solution [ophthalmic irrigation solution], Iodine, Naltrexone, and Uni-cortrom    Past Medical History  Past Medical History:   Diagnosis Date   ??? Chronic hypertension during pregnancy, antepartum 07/22/2015    Overview:  Methyldopa recommended per nephrologist if needed   ??? Diabetes mellitus type 1 (CMS-HCC)    ??? ESRD (end stage renal disease) (CMS-HCC)    ??? History of pre-eclampsia 10/24/2016   ??? 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 AV FISTULA PLACEMENT  2018   ??? CESAREAN SECTION     ??? PR COLONOSCOPY FLX DX W/COLLJ SPEC WHEN PFRMD N/A 02/14/2020    Procedure: COLONOSCOPY, FLEXIBLE, PROXIMAL TO SPLENIC FLEXURE; DIAGNOSTIC, W/WO COLLECTION SPECIMEN BY BRUSH OR WASH;  Surgeon: Carmon Ginsberg, MD;  Location: [facility];  Service: Gastroenterology   ??? PR COLONOSCOPY FLX DX W/COLLJ SPEC WHEN PFRMD N/A 06/19/2020    Procedure: COLONOSCOPY, FLEXIBLE, PROXIMAL TO SPLENIC FLEXURE; DIAGNOSTIC, W/WO COLLECTION SPECIMEN BY BRUSH OR WASH;  Surgeon: Carmon Ginsberg, MD;  Location: [facility];  Service: Gastroenterology   ??? PR FECAL MICROBIOTA PREP INSTIL N/A 02/14/2020    Procedure: PREP W INSTILLATION FECAL MICROBIOTA, ANY METHOD;  Surgeon: Carmon Ginsberg, MD;  Location: [facility];  Service: Gastroenterology   ??? PR PREPARE FECAL MICROBIOTA FOR INSTILLATION  06/19/2020    Procedure: PREP FECAL MICROBIOTA FOR INSTILLATION, INCLUDING ASSESSMENT OF DONOR SPECIMEN;  Surgeon: Carmon Ginsberg, MD;  Location: [facility];  Service: Gastroenterology   ??? PR TRANSPLANT ALLOGRAFT PANCREAS N/A 05/03/2019    Procedure: TRANSPLANTATION OF PANCREATIC ALLOGRAFT;  Surgeon: Leona Carry, MD;  Location: MA[facility];  Service: Transplant   ??? PR TRANSPLANT,PREP CADAVER RENAL GRAFT N/A 05/03/2019    Procedure: [facility] STD PREP CAD DONR RENAL ALLOGFT  PRIOR TO TRNSPLNT, INCL DISSEC/REM PERINEPH FAT, DIAPH/RTPER ATTAC;  Surgeon: Leona Carry, MD;  Location: MAIN OR [facility];  Service: Transplant   ??? PR TRANSPLANT,PREP DONOR PANCREAS N/A 05/03/2019    Procedure: [facility] STANDARD PREPARATION OF CADAVER DONOR PANCREAS ALLOGRAFT PRIOR TO TRANSPLANTATION;  Surgeon: Leona Carry, MD;  Location: MAIN OR [facility];  Service: Transplant   ??? PR TRANSPLANTATION OF KIDNEY N/A 05/03/2019    Procedure: RENAL ALLOTRANSPLANTATION, IMPLANTATION OF GRAFT; WITHOUT RECIPIENT NEPHRECTOMY;  Surgeon: Leona Carry, MD;  Location: MAIN OR [facility];  Service: Transplant   ??? TONSILLECTOMY         Obstetric History  OB History   Gravida Para Term Preterm AB Living   2 1 0 1 0 1   SAB IAB Ectopic Molar Multiple Live Births   0 0 0 0 0 1      # Outcome Date GA Lbr Len/2nd Weight Sex Delivery Anes PTL Lv   2 Current            1 Preterm 2017 [redacted]w[redacted]d    CS-Unspec   LIV       Past Social History  Social History     Socioeconomic History   ??? Marital status: Single     Spouse name: None   ??? Number of children: 1   ??? Years of education: None   ??? Highest education level: Some college, no degree   Tobacco Use   ??? Smoking status: Never Smoker   ??? Smokeless tobacco: Never Used   Vaping Use   ??? Vaping Use: Never used   Substance and Sexual Activity   ??? Alcohol use: Never   ??? Drug use: Never   ??? Sexual activity: Not Currently       Family History  family history includes Cancer in her maternal grandmother; Diabetes in her father, maternal grandfather, mother, and paternal grandmother.    OBJECTIVE      Vital Signs:  Temp:  [36.8 ??C (98.2 ??F)] 36.8 ??C (98.2 ??F)  Heart Rate:  [93] 93  Resp:  [16] 16  BP: (152)/(89) 152/89  SpO2:  [97 %] 97 %  No intake/output data recorded.    PHYSICAL EXAM:  General:   Awake, alert, in no distress  CV:    Regular rate   Lungs:   Normal work of breathing.  Abdomen:   Soft, nondistended, mildly TTP across lower abdomen. No guarding or rebound. No hernia. Well healed midline vertical incision to just above the umbilicus  Extremities:   No assymettry   Psych:   Appropriate mood and affect.  Neuro:    Alert and conversational    LABS     All lab results last 24 hours:    Recent Results (from the past 24 hour(s))   CBC    Collection Time: 11/17/20  8:03 AM   Result Value Ref Range    WBC 10.3 3.6 - 11.2 10*9/L    RBC 2.97 (L) 3.95 - 5.13 10*12/L    HGB 9.2 (L) 11.3 - 14.9 g/dL    HCT 08.6 (L) 57.8 - 44.0 %    MCV 89.7 77.6 - 95.7 fL    MCH 30.8 25.9 - 32.4 pg    MCHC 34.4 32.0 - 36.0 g/dL    RDW 46.9 62.9 - 52.8 %    MPV 7.7 6.8 - 10.7 fL    Platelet 265 150 - 450 10*9/L   Comprehensive Metabolic Panel    Collection Time: 11/17/20  8:03 AM   Result Value Ref Range    Sodium 138 135 - 145 mmol/L    Potassium 4.2 3.4 - 4.8 mmol/L    Chloride 106 98 - 107 mmol/L    CO2 24.0 20.0 - 31.0 mmol/L Anion Gap 8 5 - 14 mmol/L    BUN 16 9 - 23 mg/dL    Creatinine 4.54 (H) 0.60 - 0.80 mg/dL    BUN/Creatinine Ratio 12     eGFR CKD-EPI (2021) Female 55 (L) >=60 mL/min/1.14m2    Glucose 118 70 - 179 mg/dL    Calcium 9.2 8.7 - 09.8 mg/dL    Albumin 3.7 3.4 - 5.0 g/dL    Total Protein 6.9 5.7 - 8.2 g/dL    Total Bilirubin 0.9 0.3 - 1.2 mg/dL    AST 19 <=11 U/L    ALT 18 10 - 49 U/L    Alkaline Phosphatase 46 46 - 116 U/L       IMAGING   US Pelvic (Transvaginal) - PREGNANCY RELATED  Narrative: Francia Greaves: US OB TRANSVAGINAL  DATE: 11/02/2020 1:05 PM  ACCESSION: [id number]  DICTATED: 11/02/2020 1:07 PM  INTERPRETATION LOCATION: [facility]    CLINICAL INDICATION: vaginal bleeding, abd pain pregnant      COMPARISON: CT abdomen and pelvis without contrast 02/17/2020    TECHNIQUE: Ultrasound views of the pelvis were obtained endovaginally using gray scale and color Doppler imaging. Spectral Doppler was also performed.    FINDINGS:     Irregularly-shaped heterogeneous cystic area in the uterine fundus measuring 2.0 x 1.8 x 1.7 cm. No definite yolk sac or embryo is identified.    There is a thickened and irregular portion of the endometrium with small cystic areas in the mid uterus.    OVARIES: The ovaries were seen well transvaginally. Small cystic areas were seen within both ovaries compatible with follicles. Appropriate arterial inflow and venous outflow was seen in both ovaries with color and spectral  doppler. A dominant follicle measures 1.8 x 1.8 x 1.5 cm in the right ovary.     OTHER: Small amount of pelvic free fluid.  Impression: Irregularly shaped heterogeneous cystic collection in the uterine fundus. No definite embryo or yolk sac is seen. This is a pregnancy of unknown location and the differential diagnosis includes early pregnancy, pregnancy failure and ectopic pregnancy cannot be excluded. If this is an early pregnancy, the irregular shape of the gestational sac is worrisome for pregnancy failure. Correlation with serial physical exam, serial beta hCG, ectopic precautions and follow-up ultrasound until a diagnosis is made is recommended.    Small amount of free fluid in the pelvis.    Spectral doppler was performed on bilateral ovaries.  Additional transabdominal images were not obtained.     Please see below for data measurements:    LMP: UNSURE  HCG 14771.3     Yolk sac NOT WELL SEEN   Gestational sac 1.48 cm  Crown rump length 0.46 cm  Cardiac activity NO  FHR: NO    Uterus: Sagittal 10.1 cm; AP 5.6 cm; Transverse 6.0 cm   Endometrium: 4.2 cm  Right ovary: Sagittal 5.1 cm; AP 1.9 cm; Transverse 1.6 cm  Left ovary: Sagittal 3.6 cm; AP 2.5 cm; Transverse 1.9 cm     Free fluid visualized:yes       OSH TVUS 10/7  This result has an attachment that is not available.   CLINICAL DATA: ??Positive pregnancy test with increasing beta HCG   level of 19,019 with  pelvic pain, history of prior miscarriage     EXAM:   OBSTETRIC <14 WK [place] AND TRANSVAGINAL OB US     TECHNIQUE:   Both transabdominal and transvaginal ultrasound examinations were   performed for complete evaluation of the gestation as well as the   maternal uterus, adnexal regions, and pelvic cul-de-sac.   Transvaginal technique was performed to assess early pregnancy.     COMPARISON: ??By report from 11/02/2020. ??Films are not available     FINDINGS:   Intrauterine gestational sac: No definitive intrauterine gestational   sac is noted.     Maternal uterus/adnexae: Minimal amount of fluid is noted within the   endometrial canal. The previously seen questionable gestational sac   is not visualized on today's exam. Right ovary is within normal   limits with normal follicular change. Superior to the left ovary   there is a focal hyperechoic area without significant increased   vascularity which measures 2 cm. No discrete gestational sac is   noted however given the increasing beta HCG level from the prior   exam these findings are somewhat suspicious for possible ectopic pregnancy.     IMPRESSION:   No definitive intrauterine gestational sac is noted.     Focal hyperechoic area adjacent to the left ovary which measures 2   cm. This does not have the typical appearance of an ectopic   pregnancy however given the rising beta HCG these findings are   somewhat suspicious for ectopic. Follow-up serial beta HCG levels   and ultrasound as clinically necessary are recommended for further   evaluation.     Critical Value/emergent results were called by telephone at the time   of interpretation on 11/17/2020 at 3:15 am to [facility] ,   who verbally acknowledged these results.       Electronically Signed   ????ByLoraine Leriche ??Lukens M.D.   ????On: 11/17/2020 03:42        Radiology studies were personally reviewed.

## 2020-11-17 NOTE — ED Provider Notes (Signed)
[facility] DEPT Provider Note   CSN: [id number] Arrival date & time: 11/16/20  2057     History Chief Complaint  Patient presents with   Abdominal Pain   Emesis   Vaginal Bleeding    Christine Reed is a 35 y.o. female.  Patient is a 35 year old female G2 P1-0-0-1 at undetermined first trimester pregnancy.  Patient presenting today with complaints of lower abdominal pain, nausea, and vomiting.  She was seen approximately 2 weeks ago at [facility] for abdominal pain.  She apparently had an ultrasound there with pregnancy of undetermined location.  She was to follow-up with OB who felt as though she was having a miscarriage.  Today she presents here with continued vaginal bleeding and lower abdominal pain.  She denies any fevers or chills.  The history is provided by the patient.  Abdominal Pain Pain location:  Suprapubic and LLQ Pain quality: cramping   Pain radiates to:  L flank Pain severity:  Moderate Onset quality:  Sudden Duration:  1 day Timing:  Constant Progression:  Worsening Chronicity:  New Associated symptoms: vaginal bleeding and vomiting   Emesis Associated symptoms: abdominal pain   Vaginal Bleeding Associated symptoms: abdominal pain       Past Medical History:  Diagnosis Date   Anemia    Chronic kidney disease    Pt is on dialysis   Depression    Diabetes mellitus    diagnosed at age 38; Type 1   Diabetic retinopathy ([place])    GERD (gastroesophageal reflux disease)    Headache    Hypertension    Sickle cell trait ([place])     Patient Active Problem List   Diagnosis Date Noted   ESRD (end stage renal disease) ([place]) 01/06/2019   Abdominal pain 01/06/2019   HTN (hypertension) 01/06/2019   Chronic migraine 05/08/2017   Acute intractable headache 05/08/2017   Sepsis ([place]) 02/22/2017   ESRD on hemodialysis ([place]) 02/22/2017   Uremia 02/22/2017   DKA (diabetic ketoacidoses) 02/22/2017   Hyperkalemia    DKA, type 1 ([place])  03/01/2016   Nausea & vomiting 03/01/2016   Flu-like symptoms 03/01/2016   Nephrotic syndrome 10/17/2015   Ascites 10/17/2015   Symptomatic anemia 09/11/2015   Anemia affecting pregnancy in second trimester, antepartum 08/12/2015   Diabetic nephropathy ([place]) 08/07/2015   CKD (chronic kidney disease) stage 3, GFR 30-59 ml/min (HCC) 08/07/2015   Diabetic retinopathy ([place]) 07/24/2015   Chronic hypertension during pregnancy, antepartum 07/22/2015   Anemia, chronic disease 07/17/2015   T1DM - Type F 06/16/2015   Supervision of high risk pregnancy, antepartum 06/16/2015   Type 1 diabetes mellitus with ketoacidosis without coma ([place]) 02/19/2012    Past Surgical History:  Procedure Laterality Date   AV FISTULA PLACEMENT Right 10/10/2016   Procedure: CREATION OF RIGHT ARM RADIOCEPHALIC ARTERIOVENOUS (AV) FISTULA;  Surgeon: Serafina Mitchell, MD;  Location: [facility];  Service: Vascular;  Laterality: Right;   CESAREAN SECTION N/A 10/19/2015   Procedure: CESAREAN SECTION;  Surgeon: Chancy Milroy, MD;  Location: [facility];  Service: Obstetrics;  Laterality: N/A;   lipo suction  2015   TONSILLECTOMY     TONSILLECTOMY AND ADENOIDECTOMY       OB History     Gravida  1   Para  1   Term      Preterm  1   AB      Living  1      SAB  IAB      Ectopic      Multiple  0   Live Births              Family History  Problem Relation Age of Onset   Cancer Maternal Grandmother        colon and breast   Hypertension Maternal Grandmother    Breast cancer Maternal Grandmother    Diabetes Maternal Grandfather    Hypertension Maternal Grandfather    Diabetes Mother    Hypertension Mother    Diabetes Father    Hypertension Father    Sickle cell trait Father    Diabetes Paternal Grandmother    Hypertension Paternal Grandmother    Diabetes Paternal Grandfather    Hypertension Paternal Grandfather     Social History   Tobacco Use   Smoking status: Never   Smokeless  tobacco: Never  Vaping Use   Vaping Use: Never used  Substance Use Topics   Alcohol use: Yes    Comment: very rarely   Drug use: No    Home Medications Prior to Admission medications   Medication Sig Start Date End Date Taking? Authorizing Provider  amLODipine (NORVASC) 10 MG tablet Take 1 tablet (10 mg total) by mouth daily. 01/07/19   Aline August, MD  glucagon (GLUCAGON EMERGENCY) 1 MG injection Inject 1 mg into the skin once as needed for up to 1 dose. Inject '1mg'$  into skin once as needed for low blood sugar. 06/03/17   Elayne Snare, MD  insulin aspart (NOVOLOG) 100 UNIT/ML injection Inject 0-20 Units into the skin 3 (three) times daily before meals. Sliding scale 09/10/18   Elayne Snare, MD  Insulin Detemir (LEVEMIR FLEXTOUCH) 100 UNIT/ML Pen INJECT 10 UNITS INTO THE SKIN IN THE MORNING AND INJECT 11 UNITS INTO THE SKIN IN THE EVENING. Patient taking differently: Inject 10-11 Units into the skin See admin instructions. Inject 10 units in the morning and 11 units in the evening. 09/10/18   Elayne Snare, MD  losartan (COZAAR) 50 MG tablet Take 50 mg by mouth at bedtime. 12/11/18   [provider]  metoprolol succinate (TOPROL-XL) 50 MG 24 hr tablet Take 50 mg by mouth daily. 07/22/18   [provider]  metroNIDAZOLE (FLAGYL) 500 MG tablet Take 500 mg by mouth 2 (two) times daily. 02/16/19   [provider]  ondansetron (ZOFRAN) 4 MG tablet Take 1 tablet (4 mg total) by mouth every 8 (eight) hours as needed for nausea or vomiting. 02/23/19   Pisciotta, Elmyra Ricks, PA-C  promethazine-dextromethorphan (PROMETHAZINE-DM) 6.25-15 MG/5ML syrup Take 5 mLs by mouth 4 (four) times daily as needed for cough. 02/23/19   Pisciotta, Elmyra Ricks, PA-C  sevelamer (RENAGEL) 800 MG tablet Take 2,400 mg by mouth See admin instructions. Take '2400mg'$  three times daily with each meal and snacks 04/10/17   [provider]  buPROPion (WELLBUTRIN XL) 150 MG 24 hr tablet Take 150 mg daily by mouth.   11/10/18  [provider]    Allergies    Ivp dye [iodinated diagnostic agents], Propranolol hcl er, Iodine, and Nickel  Review of Systems   Review of Systems  Gastrointestinal:  Positive for abdominal pain and vomiting.  Genitourinary:  Positive for vaginal bleeding.  All other systems reviewed and are negative.  Physical Exam Updated Vital Signs BP (!) 152/82   Pulse 90   Temp 98.4 F (36.9 C) (Oral)   Resp 14   Ht '5\' 5"'$  (1.651 m)   Wt 81.6 kg  SpO2 97%   BMI 29.95 kg/m   Physical Exam Vitals and nursing note reviewed.  Constitutional:      General: She is not in acute distress.    Appearance: She is well-developed. She is not diaphoretic.  HENT:     Head: Normocephalic and atraumatic.  Cardiovascular:     Rate and Rhythm: Normal rate and regular rhythm.     Heart sounds: No murmur heard.   No friction rub. No gallop.  Pulmonary:     Effort: Pulmonary effort is normal. No respiratory distress.     Breath sounds: Normal breath sounds. No wheezing.  Abdominal:     General: Bowel sounds are normal. There is no distension.     Palpations: Abdomen is soft.     Tenderness: There is abdominal tenderness in the suprapubic area and left lower quadrant. There is no right CVA tenderness, left CVA tenderness, guarding or rebound.  Musculoskeletal:        General: Normal range of motion.     Cervical back: Normal range of motion and neck supple.  Skin:    General: Skin is warm and dry.  Neurological:     General: No focal deficit present.     Mental Status: She is alert and oriented to person, place, and time.    ED Results / Procedures / Treatments   Labs (all labs ordered are listed, but only abnormal results are displayed) Labs Reviewed  COMPREHENSIVE METABOLIC PANEL - Abnormal; Notable for the following components:      Result Value   Glucose, Bld 119 (*)    BUN 28 (*)    Creatinine, Ser 1.74 (*)    GFR, Estimated 39 (*)    All other components within  normal limits  CBC WITH DIFFERENTIAL/PLATELET - Abnormal; Notable for the following components:   WBC 12.5 (*)    RBC 3.05 (*)    Hemoglobin 9.5 (*)    HCT 27.4 (*)    Neutro Abs 10.7 (*)    Abs Immature Granulocytes 0.08 (*)    All other components within normal limits  HCG, QUANTITATIVE, PREGNANCY - Abnormal; Notable for the following components:   hCG, Beta Chain, Quant, S 19,019 (*)    All other components within normal limits  URINALYSIS, ROUTINE W REFLEX MICROSCOPIC - Abnormal; Notable for the following components:   Color, Urine RED (*)    APPearance CLOUDY (*)    Hgb urine dipstick LARGE (*)    Protein, ur 100 (*)    RBC / HPF >50 (*)    All other components within normal limits  TYPE AND SCREEN    EKG None  Radiology No results found.  Procedures Procedures   Medications Ordered in ED Medications  ondansetron (ZOFRAN-ODT) disintegrating tablet 4 mg (has no administration in time range)    ED Course  I have reviewed the triage vital signs and the nursing notes.  Pertinent labs & imaging results that were available during my care of the patient were reviewed by me and considered in my medical decision making (see chart for details).    MDM Rules/Calculators/A&P  Patient is a 35 year old female with early first trimester pregnancy.  She presents with lower abdominal pain and bleeding.  She also feels nauseated and is vomiting.  Patient's vital signs are stable and there is tenderness in the left lower quadrant.  She was seen at [facility] in September and was diagnosed with pregnancy of undetermined location.  Her beta hCG was 14,700  then and she was to follow-up with OB for repeat.  I am uncertain as to whether this follow-up occurred.  Today's hCG is 19,000 and ultrasound shows a hyperechoic area adjacent to the left ovary measuring 2 cm.  It does not have the typical appearance of an ectopic pregnancy, however with rising hCG and no identifiable IUP, I believe this to  be a strong possibility.  This was discussed with Dr. Nelda Marseille from OB/GYN.  She is recommending patient be transferred to MAU for further evaluation.  Patient given hydration along with medication for pain and nausea.  CRITICAL CARE Performed by: Veryl Speak Total critical care time: 40 minutes Critical care time was exclusive of separately billable procedures and treating other patients. Critical care was necessary to treat or prevent imminent or life-threatening deterioration. Critical care was time spent personally by me on the following activities: development of treatment plan with patient and/or surrogate as well as nursing, discussions with consultants, evaluation of patient's response to treatment, examination of patient, obtaining history from patient or surrogate, ordering and performing treatments and interventions, ordering and review of laboratory studies, ordering and review of radiographic studies, pulse oximetry and re-evaluation of patient's condition.   Final Clinical Impression(s) / ED Diagnoses Final diagnoses:  None    Rx / DC Orders ED Discharge Orders     None        Veryl Speak, MD 11/17/20 [phone]

## 2020-11-17 NOTE — Anesthesia Preprocedure Evaluation (Deleted)
Anesthesia Evaluation    Reviewed: Allergy & Precautions, Patient's Chart, lab work & pertinent test results  History of Anesthesia Complications Negative for: history of anesthetic complications  Airway        Dental   Pulmonary neg pulmonary ROS,           Cardiovascular      Neuro/Psych  Headaches, PSYCHIATRIC DISORDERS Depression    GI/Hepatic negative GI ROS, Neg liver ROS,   Endo/Other  diabetes, Insulin Dependent  Renal/GU Renal diseaseS/P Pancreatic/renal transplant     Musculoskeletal negative musculoskeletal ROS (+)   Abdominal   Peds  Hematology negative hematology ROS (+) Sickle cell trait and anemia ,   Anesthesia Other Findings   Reproductive/Obstetrics                             Anesthesia Physical Anesthesia Plan  ASA: 3 and emergent  Anesthesia Plan: General   Post-op Pain Management:    Induction: Intravenous  PONV Risk Score and Plan: 4 or greater and Ondansetron and Dexamethasone  Airway Management Planned: Oral ETT  Additional Equipment:   Intra-op Plan:   Post-operative Plan: Extubation in OR  Informed Consent:   Plan Discussed with: Anesthesiologist  Anesthesia Plan Comments:         Anesthesia Quick Evaluation

## 2020-11-17 NOTE — ED Notes (Signed)
[facility] contacted 

## 2020-11-17 NOTE — ED Notes (Signed)
Patient picked up by [facility]. Patient taken to MAU.

## 2020-11-17 NOTE — Progress Notes (Signed)
Pt resting comfortably after some morphine.  Had  a few sips of water but has been NPO essentially since 2 pm yesterday.  Vitals:   11/16/20 2126 11/17/20 0122 11/17/20 0426  BP: (!) 159/100 (!) 152/82 (!) 159/74  Pulse: 91 90 93  Resp: '17 14 16  '$ Temp: 98.1 F (36.7 C) 98.4 F (36.9 C) 98.3 F (36.8 C)  TempSrc: Oral Oral   SpO2: 100% 97% 99%  Weight: 81.6 kg    Height: '5\' 5"'$  (1.651 m)      Alert and oriented, no distress   Results for orders placed or performed during the hospital encounter of 11/16/20 (from the past 24 hour(s))  Urinalysis, Routine w reflex microscopic     Status: Abnormal   Collection Time: 11/16/20  9:50 PM  Result Value Ref Range   Color, Urine RED (A) YELLOW   APPearance CLOUDY (A) CLEAR   Specific Gravity, Urine 1.013 1.005 - 1.030   pH 7.0 5.0 - 8.0   Glucose, UA NEGATIVE NEGATIVE mg/dL   Hgb urine dipstick LARGE (A) NEGATIVE   Bilirubin Urine NEGATIVE NEGATIVE   Ketones, ur NEGATIVE NEGATIVE mg/dL   Protein, ur 100 (A) NEGATIVE mg/dL   Nitrite NEGATIVE NEGATIVE   Leukocytes,Ua NEGATIVE NEGATIVE   RBC / HPF >50 (H) 0 - 5 RBC/hpf   WBC, UA 6-10 0 - 5 WBC/hpf   Bacteria, UA NONE SEEN NONE SEEN   Squamous Epithelial / LPF 0-5 0 - 5  Comprehensive metabolic panel     Status: Abnormal   Collection Time: 11/16/20 10:07 PM  Result Value Ref Range   Sodium 138 135 - 145 mmol/L   Potassium 4.3 3.5 - 5.1 mmol/L   Chloride 107 98 - 111 mmol/L   CO2 23 22 - 32 mmol/L   Glucose, Bld 119 (H) 70 - 99 mg/dL   BUN 28 (H) 6 - 20 mg/dL   Creatinine, Ser 1.74 (H) 0.44 - 1.00 mg/dL   Calcium 9.8 8.9 - 10.3 mg/dL   Total Protein 7.8 6.5 - 8.1 g/dL   Albumin 4.2 3.5 - 5.0 g/dL   AST 24 15 - 41 U/L   ALT 22 0 - 44 U/L   Alkaline Phosphatase 46 38 - 126 U/L   Total Bilirubin 0.9 0.3 - 1.2 mg/dL   GFR, Estimated 39 (L) >60 mL/min   Anion gap 8 5 - 15  CBC with Differential     Status: Abnormal   Collection Time: 11/16/20 10:07 PM  Result Value Ref Range    WBC 12.5 (H) 4.0 - 10.5 K/uL   RBC 3.05 (L) 3.87 - 5.11 MIL/uL   Hemoglobin 9.5 (L) 12.0 - 15.0 g/dL   HCT 27.4 (L) 36.0 - 46.0 %   MCV 89.8 80.0 - 100.0 fL   MCH 31.1 26.0 - 34.0 pg   MCHC 34.7 30.0 - 36.0 g/dL   RDW 12.4 11.5 - 15.5 %   Platelets 278 150 - 400 K/uL   nRBC 0.0 0.0 - 0.2 %   Neutrophils Relative % 85 %   Neutro Abs 10.7 (H) 1.7 - 7.7 K/uL   Lymphocytes Relative 6 %   Lymphs Abs 0.7 0.7 - 4.0 K/uL   Monocytes Relative 5 %   Monocytes Absolute 0.7 0.1 - 1.0 K/uL   Eosinophils Relative 3 %   Eosinophils Absolute 0.3 0.0 - 0.5 K/uL   Basophils Relative 0 %   Basophils Absolute 0.1 0.0 - 0.1 K/uL   Immature Granulocytes  1 %   Abs Immature Granulocytes 0.08 (H) 0.00 - 0.07 K/uL  hCG, quantitative, pregnancy     Status: Abnormal   Collection Time: 11/16/20 10:07 PM  Result Value Ref Range   hCG, Beta Chain, Quant, S 19,019 (H) <5 mIU/mL  Type and screen [place]     Status: None   Collection Time: 11/16/20 10:07 PM  Result Value Ref Range   ABO/RH(D) O POS    Antibody Screen NEG    Sample Expiration      11/19/2020,2359 Performed at [facility], [place] [address] [place], [place] [address]   Resp Panel by RT-PCR (Flu A&B, Covid) Nasopharyngeal Swab     Status: None   Collection Time: 11/17/20  3:32 AM   Specimen: Nasopharyngeal Swab; Nasopharyngeal(NP) swabs in vial transport medium  Result Value Ref Range   SARS Coronavirus 2 by RT PCR NEGATIVE NEGATIVE   Influenza A by PCR NEGATIVE NEGATIVE   Influenza B by PCR NEGATIVE NEGATIVE  Type and screen [facility]     Status: None (Preliminary result)   Collection Time: 11/17/20  5:41 AM  Result Value Ref Range   ABO/RH(D) PENDING    Antibody Screen PENDING    Sample Expiration      11/20/2020,2359 Performed at [facility], [place] [address], [place], [place] [address]   CBC     Status: Abnormal   Collection Time: 11/17/20  5:41 AM  Result Value Ref  Range   WBC 11.8 (H) 4.0 - 10.5 K/uL   RBC 3.01 (L) 3.87 - 5.11 MIL/uL   Hemoglobin 9.3 (L) 12.0 - 15.0 g/dL   HCT 26.3 (L) 36.0 - 46.0 %   MCV 87.4 80.0 - 100.0 fL   MCH 30.9 26.0 - 34.0 pg   MCHC 35.4 30.0 - 36.0 g/dL   RDW 12.2 11.5 - 15.5 %   Platelets 263 150 - 400 K/uL   nRBC 0.0 0.0 - 0.2 %     A/P H//H is stable.  [facility] on the way for transfer.

## 2020-11-17 NOTE — H&P (Addendum)
35 y.o. G2P0101 complains of abdominal pain, nausea and bleeding. Pt presented two weeks ago to [facility] for spontaneous pregnancy and bleeding.  She had an [place] that showed no IUP and was told to f/u.  Pt presumed she then was having a miscarriage.  Last night she presented with abdominal pain, vomiting and continued vaginal bleeding to WL.  HCG was done and went from about 14k to today 18K.  Per ER physician tonight:"She was seen at [facility] in September and was diagnosed with pregnancy of undetermined location.  Her beta hCG was 14,700 then and she was to follow-up with OB for repeat.  I am uncertain as to whether this follow-up occurred.  Today's hCG is 19,000 and ultrasound shows a hyperechoic area adjacent to the left ovary measuring 2 cm.  It does not have the typical appearance of an ectopic pregnancy, however with rising hCG and no identifiable IUP, I believe this to be a strong possibility."     Past Medical History:  Diagnosis Date   Anemia    Chronic kidney disease    Pt is on dialysis   Depression    Diabetes mellitus    diagnosed at age 89; Type 1   Diabetic retinopathy (HCC)    GERD (gastroesophageal reflux disease)    Headache    Hypertension    Sickle cell trait ([place])    Past Surgical History:  Procedure Laterality Date   AV FISTULA PLACEMENT Right 10/10/2016   Procedure: CREATION OF RIGHT ARM RADIOCEPHALIC ARTERIOVENOUS (AV) FISTULA;  Surgeon: Serafina Mitchell, MD;  Location: [place];  Service: Vascular;  Laterality: Right;   CESAREAN SECTION N/A 10/19/2015   Procedure: CESAREAN SECTION;  Surgeon: Chancy Milroy, MD;  Location: [place];  Service: Obstetrics;  Laterality: N/A;   lipo suction  2015   TONSILLECTOMY     TONSILLECTOMY AND ADENOIDECTOMY     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   Right-handed.   4-5 cups caffeine per week.   Social Determinants of Health   Financial Resource Strain: Not on file  Food Insecurity: Not on file  Transportation Needs: Not on file  Physical Activity: Not on file  Stress: Not on file  Social Connections: Not on file  Intimate Partner Violence: Not on file    No current facility-administered medications on file prior to encounter.   Current Outpatient Medications on File Prior to Encounter  Medication Sig Dispense Refill   aspirin EC 81 MG tablet Take 81 mg by mouth daily. Swallow whole.     azaTHIOprine (IMURAN) 50 MG tablet Take 50 mg by mouth in the morning and at bedtime.     Biotin 1000 MCG CHEW Chew by mouth.     sodium bicarbonate 650 MG tablet Take 1,300  mg by mouth 3 (three) times daily.     Tacrolimus ER (ENVARSUS XR) 4 MG TB24 Take 16 mg by mouth.     amLODipine (NORVASC) 10 MG tablet Take 1 tablet (10 mg total) by mouth daily. 30 tablet 0   glucagon (GLUCAGON EMERGENCY) 1 MG injection Inject 1 mg into the skin once as needed for up to 1 dose. Inject '1mg'$  into skin once as needed for low blood sugar. 1 each 12   insulin aspart (NOVOLOG) 100 UNIT/ML injection Inject 0-20 Units into the skin 3 (three) times daily before meals. Sliding scale 10 mL 3   Insulin Detemir (LEVEMIR FLEXTOUCH) 100 UNIT/ML Pen INJECT 10 UNITS INTO THE SKIN IN THE MORNING AND INJECT 11 UNITS INTO THE SKIN IN THE EVENING. (Patient taking differently: Inject 10-11 Units into the skin See admin instructions. Inject 10 units in the morning  and 11 units in the evening.) 3 pen 3   losartan (COZAAR) 50 MG tablet Take 50 mg by mouth at bedtime.     metoprolol succinate (TOPROL-XL) 50 MG 24 hr tablet Take 50 mg by mouth daily.     metroNIDAZOLE (FLAGYL) 500 MG tablet Take 500 mg by mouth 2 (two) times daily.     ondansetron (ZOFRAN) 4 MG tablet Take 1 tablet (4 mg total) by mouth every 8 (eight) hours as needed for nausea or vomiting. 10 tablet 0   promethazine-dextromethorphan (PROMETHAZINE-DM) 6.25-15 MG/5ML syrup Take 5 mLs by mouth 4 (four) times daily as needed for cough. 118 mL 0   sevelamer (RENAGEL) 800 MG tablet Take 2,400 mg by mouth See admin instructions. Take '2400mg'$  three times daily with each meal and snacks  12   [DISCONTINUED] buPROPion (WELLBUTRIN XL) 150 MG 24 hr tablet Take 150 mg daily by mouth.      Allergies  Allergen Reactions   Ivp Dye [Iodinated Diagnostic Agents] Other (See Comments)    Burning    Propranolol Hcl Er Swelling   Iodine Rash   Nickel Rash    Vitals:   11/16/20 2126 11/17/20 0122 11/17/20 0426  BP: (!) 159/100 (!) 152/82 (!) 159/74  Pulse: 91 90 93  Resp: '17 14 16  '$ Temp: 98.1 F (36.7 C) 98.4 F (36.9 C) 98.3 F (36.8 C)  TempSrc: Oral Oral   SpO2: 100% 97% 99%  Weight: 81.6 kg    Height: '5\' 5"'$  (1.651 m)     Alert oriented and in mild distress Lungs: clear to ascultation Cor:  RRR Abdomen:  soft, nontender, nondistended.  Incisional scar from above umbilicus to pubis.   Ex:  no cords, erythema Pelvic:  NEFG, cervix closed with old blood coming from it .  No chandelier or severe tenderness,  May have soft area masslike on L.  Results for orders placed or performed during the hospital encounter of 11/16/20 (from the past 24 hour(s))  Urinalysis, Routine w reflex microscopic     Status: Abnormal   Collection Time: 11/16/20  9:50 PM  Result Value Ref Range   Color, Urine RED (A) YELLOW   APPearance CLOUDY (A) CLEAR   Specific Gravity, Urine 1.013 1.005 - 1.030   pH 7.0 5.0 -  8.0   Glucose, UA NEGATIVE NEGATIVE mg/dL   Hgb urine dipstick LARGE (A) NEGATIVE   Bilirubin Urine NEGATIVE NEGATIVE   Ketones, ur NEGATIVE NEGATIVE mg/dL   Protein, ur 100 (A) NEGATIVE mg/dL   Nitrite NEGATIVE NEGATIVE   Leukocytes,Ua NEGATIVE NEGATIVE   RBC / HPF >50 (  H) 0 - 5 RBC/hpf   WBC, UA 6-10 0 - 5 WBC/hpf   Bacteria, UA NONE SEEN NONE SEEN   Squamous Epithelial / LPF 0-5 0 - 5  Comprehensive metabolic panel     Status: Abnormal   Collection Time: 11/16/20 10:07 PM  Result Value Ref Range   Sodium 138 135 - 145 mmol/L   Potassium 4.3 3.5 - 5.1 mmol/L   Chloride 107 98 - 111 mmol/L   CO2 23 22 - 32 mmol/L   Glucose, Bld 119 (H) 70 - 99 mg/dL   BUN 28 (H) 6 - 20 mg/dL   Creatinine, Ser 1.74 (H) 0.44 - 1.00 mg/dL   Calcium 9.8 8.9 - 10.3 mg/dL   Total Protein 7.8 6.5 - 8.1 g/dL   Albumin 4.2 3.5 - 5.0 g/dL   AST 24 15 - 41 U/L   ALT 22 0 - 44 U/L   Alkaline Phosphatase 46 38 - 126 U/L   Total Bilirubin 0.9 0.3 - 1.2 mg/dL   GFR, Estimated 39 (L) >60 mL/min   Anion gap 8 5 - 15  CBC with Differential     Status: Abnormal   Collection Time: 11/16/20 10:07 PM  Result Value Ref Range   WBC 12.5 (H) 4.0 - 10.5 K/uL   RBC 3.05 (L) 3.87 - 5.11 MIL/uL   Hemoglobin 9.5 (L) 12.0 - 15.0 g/dL   HCT 27.4 (L) 36.0 - 46.0 %   MCV 89.8 80.0 - 100.0 fL   MCH 31.1 26.0 - 34.0 pg   MCHC 34.7 30.0 - 36.0 g/dL   RDW 12.4 11.5 - 15.5 %   Platelets 278 150 - 400 K/uL   nRBC 0.0 0.0 - 0.2 %   Neutrophils Relative % 85 %   Neutro Abs 10.7 (H) 1.7 - 7.7 K/uL   Lymphocytes Relative 6 %   Lymphs Abs 0.7 0.7 - 4.0 K/uL   Monocytes Relative 5 %   Monocytes Absolute 0.7 0.1 - 1.0 K/uL   Eosinophils Relative 3 %   Eosinophils Absolute 0.3 0.0 - 0.5 K/uL   Basophils Relative 0 %   Basophils Absolute 0.1 0.0 - 0.1 K/uL   Immature Granulocytes 1 %   Abs Immature Granulocytes 0.08 (H) 0.00 - 0.07 K/uL  hCG, quantitative, pregnancy     Status: Abnormal   Collection Time: 11/16/20 10:07 PM   Result Value Ref Range   hCG, Beta Chain, Quant, S 19,019 (H) <5 mIU/mL  Type and screen [place]     Status: None   Collection Time: 11/16/20 10:07 PM  Result Value Ref Range   ABO/RH(D) O POS    Antibody Screen NEG    Sample Expiration      11/19/2020,2359 Performed at [facility], [place] [address] [place], [place] [address]   Resp Panel by RT-PCR (Flu A&B, Covid) Nasopharyngeal Swab     Status: None   Collection Time: 11/17/20  3:32 AM   Specimen: Nasopharyngeal Swab; Nasopharyngeal(NP) swabs in vial transport medium  Result Value Ref Range   SARS Coronavirus 2 by RT PCR NEGATIVE NEGATIVE   Influenza A by PCR NEGATIVE NEGATIVE   Influenza B by PCR NEGATIVE NEGATIVE    [place] today No definitive intrauterine gestational sac is noted.   Maternal uterus/adnexae: Minimal amount of fluid is noted within the endometrial canal. The previously seen questionable gestational sac is not visualized on today's exam. Right ovary is within normal limits with normal follicular change.  Superior to the left ovary there is a focal hyperechoic area without significant increased vascularity which measures 2 cm. No discrete gestational sac is noted however given the increasing beta HCG level from the prior exam these findings are somewhat suspicious for possible ectopic pregnancy.   IMPRESSION: No definitive intrauterine gestational sac is noted.   Focal hyperechoic area adjacent to the left ovary which measures 2 cm. This does not have the typical appearance of an ectopic pregnancy however given the rising beta HCG these findings are somewhat suspicious for ectopic. Follow-up serial beta HCG levels and ultrasound as clinically necessary are recommended for further evaluation.    A:  35 y.o. G2P0101 with likely L ectopic pregnancy. No signs of rupture at this point.  Pt has a recent pancreatic/L kidney transplant and will likely need that team's help in  planning a surgical route.  Pt has incision that extends around and above the umbilicus and therefore I do not think she is a candidate for laparoscopy.   Pt is no longer on insulin or hypertension meds as the dual pancreas/kidney transplant has corrected both those problems.   P: We are repeating the H/H and in the process of transferring to [facility] for care as long as the patient remains stable.  Christine Reed

## 2020-11-17 NOTE — MAU Note (Signed)
Pt came over from [facility] via [facility] due to being early pregnant and suspicious for ectopic. Having some vag bleeding and adominal pain

## 2020-11-18 LAB — CBC
HEMATOCRIT: 27.5 % — ABNORMAL LOW (ref 34.0–44.0)
HEMOGLOBIN: 9.6 g/dL — ABNORMAL LOW (ref 11.3–14.9)
MEAN CORPUSCULAR HEMOGLOBIN CONC: 34.8 g/dL (ref 32.0–36.0)
MEAN CORPUSCULAR HEMOGLOBIN: 31 pg (ref 25.9–32.4)
MEAN CORPUSCULAR VOLUME: 89.1 fL (ref 77.6–95.7)
MEAN PLATELET VOLUME: 7.6 fL (ref 6.8–10.7)
PLATELET COUNT: 269 10*9/L (ref 150–450)
RED BLOOD CELL COUNT: 3.09 10*12/L — ABNORMAL LOW (ref 3.95–5.13)
RED CELL DISTRIBUTION WIDTH: 12.8 % (ref 12.2–15.2)
WBC ADJUSTED: 10.4 10*9/L (ref 3.6–11.2)

## 2020-11-18 LAB — BASIC METABOLIC PANEL
ANION GAP: 9 mmol/L (ref 5–14)
BLOOD UREA NITROGEN: 16 mg/dL (ref 9–23)
BUN / CREAT RATIO: 11
CALCIUM: 9.3 mg/dL (ref 8.7–10.4)
CHLORIDE: 106 mmol/L (ref 98–107)
CO2: 24 mmol/L (ref 20.0–31.0)
CREATININE: 1.43 mg/dL — ABNORMAL HIGH
EGFR CKD-EPI (2021) FEMALE: 50 mL/min/{1.73_m2} — ABNORMAL LOW (ref >=60)
GLUCOSE RANDOM: 93 mg/dL (ref 70–179)
POTASSIUM: 4.4 mmol/L (ref 3.4–4.8)
SODIUM: 139 mmol/L (ref 135–145)

## 2020-11-18 LAB — HCG QUANTITATIVE, BLOOD: GONADOTROPIN, CHORIONIC (HCG) QUANT: 4031.1 m[IU]/mL

## 2020-11-18 MED ADMIN — sodium bicarbonate tablet 1,300 mg: 1300 mg | ORAL | @ 14:00:00 | Stop: 2020-11-18

## 2020-11-18 MED ADMIN — senna (SENOKOT) tablet 1 tablet: 1 | ORAL | @ 01:00:00

## 2020-11-18 MED ADMIN — acetaminophen (TYLENOL) tablet 650 mg: 650 mg | ORAL | @ 07:00:00 | Stop: 2020-11-18

## 2020-11-18 MED ADMIN — acetaminophen (TYLENOL) tablet 1,000 mg: 1000 mg | ORAL | @ 14:00:00 | Stop: 2020-11-18

## 2020-11-18 MED ADMIN — acetaminophen (TYLENOL) tablet 650 mg: 650 mg | ORAL | @ 01:00:00

## 2020-11-18 MED ADMIN — azaTHIOprine (IMURAN) tablet 100 mg: 100 mg | ORAL | @ 14:00:00 | Stop: 2020-11-18

## 2020-11-18 MED ADMIN — simethicone (MYLICON) chewable tablet 80 mg: 80 mg | ORAL | @ 10:00:00 | Stop: 2020-11-18

## 2020-11-18 MED ADMIN — tacrolimus (ENVARSUS XR) extended release tablet 16 mg: 16 mg | ORAL | @ 14:00:00 | Stop: 2020-11-18

## 2020-11-18 MED ADMIN — sodium bicarbonate tablet 1,300 mg: 1300 mg | ORAL | @ 01:00:00

## 2020-11-18 MED ADMIN — aspirin chewable tablet 81 mg: 81 mg | ORAL | @ 14:00:00 | Stop: 2020-11-18

## 2020-11-18 MED ADMIN — simethicone (MYLICON) chewable tablet 80 mg: 80 mg | ORAL | @ 01:00:00

## 2020-11-18 NOTE — Unmapped (Signed)
Transplant Nephrology Consult     Requesting provider: [facility]  Service requesting consult: GYN  Reason for consult: Transplanted kidney/immunosuppression      Assessment/Recommendations: Elaine Koch is a 35 y.o. 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 Went to OR on 10/7. Transplant following for immunosuppression and graft function management.    #Immunosuppression  -Continue Envarsus 16 mg daily (goal tac levels 8-10)  -Continue Imuran 100 mg daily    #Graft Function  -Baseline creatinine is 1.3-1.6, patient at baseline at this time        Justine Null  Division of Nephrology and Hypertension  [facility]  11/18/2020  8:12 AM      _____________________________________________________________________________________      History of Present Illness: Elaine Koch is a/an 35 y.o. 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 Went to OR on 10/7. Transplant following for immunosuppression and graft function management.    Findings in OR yesterday:  1. Anteverted uterus  2. Cervix dilated with minimal blood in vaginal vault  3. Minimal blood products suctioned from uterus. Gritty texture of uterus following suction curettage  4. Intra-operative frozen section of uterine contents without chorionic villi  5. Significant intra-abdominal adhesions   6. Normal fallopian tubes and ovaries bilaterally  7. Normal ovaries bilaterally    Reports today feeling well. Still with some abdominal pain. Denies chest pain, shortness of breath or leg swelling. No headaches.      Medications:   Current Facility-Administered Medications   Medication Dose Route Frequency Provider Last Rate Last Admin   ??? acetaminophen (TYLENOL) tablet 1,000 mg  1,000 mg Oral Q8H Andrea A Henricks, MD   1,000 mg at 11/17/20 1004   ??? aspirin chewable tablet 81 mg  81 mg Oral Daily Jeoffrey Massed Henricks, MD   81 mg at 11/17/20 1004   ??? azaTHIOprine (IMURAN) tablet 100 mg  100 mg Oral Daily Jeoffrey Massed Henricks, MD   100 mg at 11/17/20 1046   ??? calcium carbonate (TUMS) chewable tablet 400 mg of elem calcium  400 mg of elem calcium Oral TID PRN Jeoffrey Massed Henricks, MD       ??? diphenhydrAMINE (BENADRYL) capsule/tablet 25 mg  25 mg Oral Q4H PRN Jeoffrey Massed Henricks, MD       ??? MORPhine 4 mg/mL injection 4 mg  4 mg Intravenous Q3H PRN Jeoffrey Massed Henricks, MD       ??? ondansetron (ZOFRAN-ODT) disintegrating tablet 8 mg  8 mg Oral Q8H PRN Jeoffrey Massed Henricks, MD        Or   ??? ondansetron (ZOFRAN) injection 4 mg  4 mg Intravenous Q8H PRN Jeoffrey Massed Henricks, MD       ??? oxyCODONE (ROXICODONE) immediate release tablet 5 mg  5 mg Oral Q4H PRN Jeoffrey Massed Henricks, MD        Or   ??? oxyCODONE (ROXICODONE) immediate release tablet 10 mg  10 mg Oral Q4H PRN Hardie Pulley, MD       ??? polyethylene glycol (MIRALAX) packet 17 g  17 g Oral Daily Jeoffrey Massed Henricks, MD       ??? senna (SENOKOT) tablet 1 tablet  1 tablet Oral Nightly Jeoffrey Massed Henricks, MD   1 tablet at 11/17/20 2041   ??? simethicone (MYLICON) chewable tablet 80 mg  80 mg  Oral QID Jeoffrey Massed Henricks, MD   80 mg at 11/18/20 8469   ??? sodium bicarbonate tablet 1,300 mg  1,300 mg Oral TID Jeoffrey Massed Henricks, MD   1,300 mg at 11/17/20 2041   ??? tacrolimus (ENVARSUS XR) extended release tablet 16 mg  16 mg Oral Daily Jeoffrey Massed Henricks, MD   16 mg at 11/17/20 1046        ALLERGIES  Iodinated contrast media, Nickel, Propranolol, Eye irrigating solution [ophthalmic irrigation solution], Iodine, Naltrexone, and Uni-cortrom    MEDICAL HISTORY  Past Medical History:   Diagnosis Date   ??? Chronic hypertension during pregnancy, antepartum 07/22/2015    Overview:  Methyldopa recommended per nephrologist if needed   ??? Diabetes mellitus type 1 (CMS-HCC)    ??? ESRD (end stage renal disease) (CMS-HCC)    ??? History of pre-eclampsia 10/24/2016   ??? 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        SOCIAL HISTORY  Social History     Socioeconomic History   ??? Marital status: Single   ??? Number of children: 1   ??? Highest education level: Some college, no degree   Tobacco Use   ??? Smoking status: Never Smoker   ??? Smokeless tobacco: Never Used   Vaping Use   ??? Vaping Use: Never used   Substance and Sexual Activity   ??? Alcohol use: Never   ??? Drug use: Never   ??? Sexual activity: Not Currently        FAMILY HISTORY  Family History   Problem Relation Age of Onset   ??? Diabetes Mother    ??? Diabetes Father    ??? Cancer Maternal Grandmother    ??? Diabetes Maternal Grandfather    ??? Diabetes Paternal Grandmother         Review of Systems:  A 12 system review of systems was negative except as noted in HPI.  Otherwise as per HPI, all other systems reviewed and negative    Physical Exam:  Vitals:    11/18/20 0320   BP: 138/77   Pulse: 92   Resp: 16   Temp: 36.9 ??C (98.5 ??F)   SpO2: 98%     No intake/output data recorded.    Intake/Output Summary (Last 24 hours) at 11/18/2020 6295  Last data filed at 11/17/2020 2313  Gross per 24 hour   Intake 1210 ml   Output 2175 ml   Net -965 ml     General: well-appearing, no acute distress  HEENT: anicteric sclera, oropharynx clear without lesions  CV: regular rate, normal rhythm, no murmurs, no gallops, no rubs, no peripheral edema  Lungs: clear to auscultation bilaterally, normal work of breathing  Abd: soft, non-tender, non-distended  Skin: no visible lesions or rashes  Psych: alert, engaged, appropriate mood and affect  Musculoskeletal: no obvious deformities  Neuro: normal speech, no gross focal deficits     Test Results  Reviewed  Lab Results   Component Value Date    NA 139 11/18/2020    K 4.4 11/18/2020    CL 106 11/18/2020    CO2 24.0 11/18/2020    BUN 16 11/18/2020    CREATININE 1.43 (H) 11/18/2020    GLU 93 11/18/2020    CALCIUM 9.3 11/18/2020    ALBUMIN 3.7 11/17/2020    PHOS 2.5 09/07/2020         I have reviewed all relevant outside healthcare records related to the patient's kidney injury.

## 2020-11-18 NOTE — Unmapped (Signed)
Patient admitted to unit this morning. VSS. All scheduled medications given. Patient reported pain well controlled this shift. Adequate urine output. Remains free from falls and other injuries. Patient was on NPO status upon arrival. Patient transferred to OR around 1300. Awaiting patients return to unit.     Problem: Adult Inpatient Plan of Care  Goal: Plan of Care Review  Outcome: Progressing  Goal: Patient-Specific Goal (Individualized)  Outcome: Progressing  Goal: Absence of Hospital-Acquired Illness or Injury  Outcome: Progressing  Intervention: Identify and Manage Fall Risk  Recent Flowsheet Documentation  Taken 11/17/2020 0718 by Jolyne Loa, RN  Safety Interventions:   low bed   fall reduction program maintained   lighting adjusted for tasks/safety   nonskid shoes/slippers when out of bed  Goal: Optimal Comfort and Wellbeing  Outcome: Progressing  Goal: Readiness for Transition of Care  Outcome: Progressing  Goal: Rounds/Family Conference  Outcome: Progressing     Problem: Impaired Wound Healing  Goal: Optimal Wound Healing  Outcome: Progressing

## 2020-11-18 NOTE — Unmapped (Signed)
Pt. Admitted from PACU overnight. VSS. A+Ox4. Pain controlled w/ scheduled pain meds.incisions are c/d/I with dermabond. Pt. Denies nausea. Adequate UOP overnight. Bed is low, locked, and call bell is within reach.   Problem: Adult Inpatient Plan of Care  Goal: Plan of Care Review  Outcome: Progressing  Goal: Patient-Specific Goal (Individualized)  Outcome: Progressing  Goal: Absence of Hospital-Acquired Illness or Injury  Outcome: Progressing  Intervention: Identify and Manage Fall Risk  Recent Flowsheet Documentation  Taken 11/17/2020 2100 by Simone Curia, RN  Safety Interventions:   fall reduction program maintained   nonskid shoes/slippers when out of bed   low bed   lighting adjusted for tasks/safety  Goal: Optimal Comfort and Wellbeing  Outcome: Progressing     Problem: Impaired Wound Healing  Goal: Optimal Wound Healing  Outcome: Progressing

## 2020-11-18 NOTE — Unmapped (Signed)
Pt is getting discharged.

## 2020-11-24 NOTE — Unmapped (Signed)
Called patient. Verified name and DOB     Plan for 1 week beta today. Per patient, unable to go to lab today but has plans to repeat beta tomorrow which is reasonable given last draw was 10/8.     Will update beta list problem    Elaine Hila, MD  PGY-4 Resident Physician   OB/GYN

## 2020-11-27 DIAGNOSIS — O039 Complete or unspecified spontaneous abortion without complication: Principal | ICD-10-CM

## 2020-11-27 DIAGNOSIS — O0289 Other abnormal products of conception: Principal | ICD-10-CM

## 2020-11-27 NOTE — Unmapped (Signed)
Unable to reach patient by phone (No answer and no VM available). Will send mychart message.

## 2020-11-28 DIAGNOSIS — O3680X0 Pregnancy with inconclusive fetal viability, not applicable or unspecified: Principal | ICD-10-CM

## 2020-11-28 NOTE — Unmapped (Signed)
PC to patient for beta reminder. NA and VM not set up. Will try one more time tomorrow. If unable to reach, will send certified letter.

## 2020-11-29 DIAGNOSIS — O3680X0 Pregnancy with inconclusive fetal viability, not applicable or unspecified: Principal | ICD-10-CM

## 2020-11-29 NOTE — Unmapped (Signed)
PC to patient for beta reminder. NA and VM not set up. Patient has not read mychart message. Will send certified letter.

## 2020-12-14 NOTE — Unmapped (Signed)
The [facility] has made a third and final attempt to reach this patient to refill the following medication:envarsus.      We have been unable to leave messages on the following phone numbers: [phone]  and have sent a MyChart message.    Dates contacted: 10/27, 11/3, 11/7  Last scheduled delivery: 10/6    The patient may be at risk of non-compliance with this medication. The patient should call the [facility] at [phone]  Option 4, then Option 2 (all other specialty patients) to refill medication.    Thad Ranger   [facility] Specialty Pharmacist          The [facility] has made a second and final attempt to reach this patient to refill the following medication: .  Envarsus    We have left voicemails on the following phone numbers: 10/27  12/14/20.    Dates contacted:  10/27  11/3  Last scheduled delivery:  11/16/20    The patient may be at risk of non-compliance with this medication. The patient should call the [facility] at [phone]  Option 4, then Option 2 (all other specialty patients) to refill medication.    [place] A Orlie Cundari   [facility] Specialty Technician

## 2020-12-19 NOTE — Unmapped (Signed)
[facility] Refill Coordination Note    Specialty Medication(s) to be Shipped:   Transplant: Envarsus 4mg     Other medication(s) to be shipped: aspirin, sodium bicarbonate     Elaine Koch, DOB: 1985-10-01  Phone: [phone] (home)       All above HIPAA information was verified with patient.     Was a Nurse, learning disability used for this call? No    Completed refill call assessment today to schedule patient's medication shipment from the [facility] [phone]).  All relevant notes have been reviewed.     Specialty medication(s) and dose(s) confirmed: Regimen is correct and unchanged.   Changes to medications: Mc reports no changes at this time.  Changes to insurance: No  New side effects reported not previously addressed with a pharmacist or physician: None reported  Questions for the pharmacist: No    Confirmed patient received a Conservation officer, historic buildings and a Surveyor, mining with first shipment. The patient will receive a drug information handout for each medication shipped and additional FDA Medication Guides as required.       DISEASE/MEDICATION-SPECIFIC INFORMATION        N/A    SPECIALTY MEDICATION ADHERENCE     Medication Adherence    Patient reported X missed doses in the last month: 0  Specialty Medication: Envarsus 4mg   Patient is on additional specialty medications: No  Patient is on more than two specialty medications: No  Any gaps in refill history greater than 2 weeks in the last 3 months: no  Demonstrates understanding of importance of adherence: yes  Informant: patient              Were doses missed due to medication being on hold? No    Envarsus 4mg : Patient has 7 days of medication on hand    REFERRAL TO PHARMACIST     Referral to the pharmacist: Not needed      [facility]     Shipping address confirmed in Epic.     Delivery Scheduled: Yes, Expected medication delivery date: 11/11.     Medication will be delivered via UPS to the prescription address in Epic WAM.    Olga Millers   [facility] Specialty Technician

## 2020-12-21 MED FILL — ASPIRIN 81 MG TABLET,DELAYED RELEASE: ORAL | 30 days supply | Qty: 30 | Fill #7

## 2020-12-21 MED FILL — SODIUM BICARBONATE 650 MG TABLET: ORAL | 30 days supply | Qty: 180 | Fill #2

## 2020-12-21 MED FILL — ENVARSUS XR 4 MG TABLET,EXTENDED RELEASE: ORAL | 30 days supply | Qty: 120 | Fill #2

## 2020-12-22 LAB — CBC W/ DIFFERENTIAL
BANDED NEUTROPHILS ABSOLUTE COUNT: 0 10*3/uL (ref 0.0–0.1)
BASOPHILS ABSOLUTE COUNT: 0 10*3/uL (ref 0.0–0.2)
BASOPHILS RELATIVE PERCENT: 1 %
EOSINOPHILS ABSOLUTE COUNT: 0.2 10*3/uL (ref 0.0–0.4)
EOSINOPHILS RELATIVE PERCENT: 5 %
HEMATOCRIT: 28.2 % — ABNORMAL LOW (ref 34.0–46.6)
HEMOGLOBIN: 9.6 g/dL — ABNORMAL LOW (ref 11.1–15.9)
IMMATURE GRANULOCYTES: 0 %
LYMPHOCYTES ABSOLUTE COUNT: 0.9 10*3/uL (ref 0.7–3.1)
LYMPHOCYTES RELATIVE PERCENT: 24 %
MEAN CORPUSCULAR HEMOGLOBIN CONC: 34 g/dL (ref 31.5–35.7)
MEAN CORPUSCULAR HEMOGLOBIN: 31.1 pg (ref 26.6–33.0)
MEAN CORPUSCULAR VOLUME: 91 fL (ref 79–97)
MONOCYTES ABSOLUTE COUNT: 0.2 10*3/uL (ref 0.1–0.9)
MONOCYTES RELATIVE PERCENT: 5 %
NEUTROPHILS ABSOLUTE COUNT: 2.4 10*3/uL (ref 1.4–7.0)
NEUTROPHILS RELATIVE PERCENT: 65 %
PLATELET COUNT: 334 10*3/uL (ref 150–450)
RED BLOOD CELL COUNT: 3.09 x10E6/uL — ABNORMAL LOW (ref 3.77–5.28)
RED CELL DISTRIBUTION WIDTH: 16.6 % — ABNORMAL HIGH (ref 11.7–15.4)
WHITE BLOOD CELL COUNT: 3.7 10*3/uL (ref 3.4–10.8)

## 2020-12-22 LAB — BASIC METABOLIC PANEL
BLOOD UREA NITROGEN: 16 mg/dL (ref 6–20)
BUN / CREAT RATIO: 10 (ref 9–23)
CALCIUM: 9.2 mg/dL (ref 8.7–10.2)
CHLORIDE: 107 mmol/L — ABNORMAL HIGH (ref 96–106)
CO2: 21 mmol/L (ref 20–29)
CREATININE: 1.54 mg/dL — ABNORMAL HIGH (ref 0.57–1.00)
GLUCOSE: 97 mg/dL (ref 70–99)
POTASSIUM: 4.6 mmol/L (ref 3.5–5.2)
SODIUM: 140 mmol/L (ref 134–144)

## 2020-12-22 LAB — PHOSPHORUS: PHOSPHORUS, SERUM: 3.2 mg/dL (ref 3.0–4.3)

## 2020-12-22 LAB — MAGNESIUM: MAGNESIUM: 1.6 mg/dL (ref 1.6–2.3)

## 2020-12-25 LAB — TACROLIMUS LEVEL: TACROLIMUS BLOOD: 7.7 ng/mL (ref 2.0–20.0)

## 2021-01-19 NOTE — Unmapped (Signed)
The [facility] has made a third and final attempt to reach this patient to refill the following medication:envarsus maintenance meds.      We have been unable to leave messages on the following phone numbers: [phone]  and have sent a MyChart message.    Dates contacted: 12/2, 12/9, 12/12  Last scheduled delivery: 11/10    The patient may be at risk of non-compliance with this medication. The patient should call the [facility] at [phone]  Option 4, then Option 2 (all other specialty patients) to refill medication.    Thad Ranger   [facility] Specialty Pharmacist          The [facility] has made a second and final attempt to reach this patient to refill the following medication: Envarsus 4mg .      We have been unable to leave messages on the following phone numbers: [phone].    Dates contacted: 01/12/21  01/19/21  Last scheduled delivery:  12/21/20    The patient may be at risk of non-compliance with this medication. The patient should call the [facility] at [phone]  Option 4, then Option 2 (all other specialty patients) to refill medication.    [place] A Adya Wirz   [facility] Specialty Technician

## 2021-01-22 MED ORDER — AZATHIOPRINE 50 MG TABLET
ORAL_TABLET | Freq: Every day | ORAL | 11 refills | 30.00000 days | Status: CP
Start: 2021-01-22 — End: 2022-01-22
  Filled 2021-01-22: qty 60, 30d supply, fill #0

## 2021-01-22 NOTE — Unmapped (Signed)
Elaine Koch 's Azathioprine shipment will be sent out  as a result of a new prescription for the medication has been received.      I have reached out to the patient  at [phone] and communicated the delivery change. We will reschedule the medication for the delivery date that the patient agreed upon.  We have confirmed the delivery date as 01/23/2021, via ups.

## 2021-01-22 NOTE — Unmapped (Addendum)
Not a specialty medication so does not need onboarding.  Delivery already set up by another team member - see notes from 12/12.       [facility] Specialty Medication Onboarding    Specialty Medication: Azathioprine 50mg  tablet  Prior Authorization: Not Required   Financial Assistance: No - copay  <$25  Final Copay/Day Supply: $0 / 30 days    Insurance Restrictions: Yes - max 1 month supply     Notes to Pharmacist: This is being billed to part B    The triage team has completed the benefits investigation and has determined that the patient is able to fill this medication at [facility]. Please contact the patient to complete the onboarding or follow up with the prescribing physician as needed.

## 2021-01-22 NOTE — Unmapped (Addendum)
[facility] Refill Coordination Note    Specialty Medication(s) to be Shipped:   Transplant: Envarsus 4mg     Other medication(s) to be shipped: aspirin 81mg ,sodium bicarb,azathioprine     Elaine Koch, DOB: [birth year]  Phone: [phone] (home)       All above HIPAA information was verified with patient.     Was a Nurse, learning disability used for this call? No    Completed refill call assessment today to schedule patient's medication shipment from the [facility] [phone]).  All relevant notes have been reviewed.     Specialty medication(s) and dose(s) confirmed: Regimen is correct and unchanged.   Changes to medications: Minah reports no changes at this time.  Changes to insurance: No  New side effects reported not previously addressed with a pharmacist or physician: None reported  Questions for the pharmacist: No    Confirmed patient received a Conservation officer, historic buildings and a Surveyor, mining with first shipment. The patient will receive a drug information handout for each medication shipped and additional FDA Medication Guides as required.       DISEASE/MEDICATION-SPECIFIC INFORMATION        N/A    SPECIALTY MEDICATION ADHERENCE     Medication Adherence    Patient reported X missed doses in the last month: 0  Specialty Medication: envarsus 4mg   Patient is on additional specialty medications: Yes  Additional Specialty Medications: Azathioprine 50mg   Patient Reported Additional Medication X Missed Doses in the Last Month: 2  Patient is on more than two specialty medications: No  Any gaps in refill history greater than 2 weeks in the last 3 months: no  Demonstrates understanding of importance of adherence: yes  Informant: patient  Reliability of informant: reliable  Provider-estimated medication adherence level: good  Patient is at risk for Non-Adherence: No  Reasons for non-adherence: no problems identified  Confirmed plan for next specialty medication refill: delivery by pharmacy  Refills needed for supportive medications: not needed          Refill Coordination    Has the Patients' Contact Information Changed: No  Is the Shipping Address Different: No         Were doses missed due to medication being on hold? No      Envarsus 4 mg: 5 days of medicine on hand   Azathioprine 50mg  0 days of medicine on hand     REFERRAL TO PHARMACIST     Referral to the pharmacist: Not needed      [facility]     Shipping address confirmed in Epic.     Delivery Scheduled: Yes, Expected medication delivery date: 12/13.     Medication will be delivered via UPS to the prescription address in Epic WAM.    Thad Ranger   [facility] Specialty Technician

## 2021-01-24 MED FILL — ASPIRIN 81 MG TABLET,DELAYED RELEASE: ORAL | 30 days supply | Qty: 30 | Fill #8

## 2021-01-24 MED FILL — SODIUM BICARBONATE 650 MG TABLET: ORAL | 30 days supply | Qty: 180 | Fill #3

## 2021-01-24 MED FILL — ENVARSUS XR 4 MG TABLET,EXTENDED RELEASE: ORAL | 30 days supply | Qty: 120 | Fill #3

## 2021-01-25 ENCOUNTER — Telehealth: Payer: Self-pay | Admitting: Internal Medicine

## 2021-01-25 NOTE — Telephone Encounter (Signed)
Called patient to schedule her an office visit and/or physical because she hasn't been seen in the office by Dr.Hernandez since December of 2020.   Patient turned down offer and stated "not at this time".

## 2021-01-28 ENCOUNTER — Other Ambulatory Visit: Payer: Self-pay

## 2021-01-28 ENCOUNTER — Emergency Department (HOSPITAL_COMMUNITY)
Admission: EM | Admit: 2021-01-28 | Discharge: 2021-01-29 | Disposition: A | Discharge status: Home / Self Care | Payer: Medicare Other | Attending: Emergency Medicine | Admitting: Emergency Medicine

## 2021-01-28 ENCOUNTER — Encounter (HOSPITAL_COMMUNITY): Payer: Self-pay | Admitting: *Deleted

## 2021-01-28 DIAGNOSIS — Z20822 Contact with and (suspected) exposure to covid-19: Secondary | ICD-10-CM | POA: Diagnosis not present

## 2021-01-28 DIAGNOSIS — M545 Low back pain, unspecified: Secondary | ICD-10-CM | POA: Insufficient documentation

## 2021-01-28 DIAGNOSIS — Z3A00 Weeks of gestation of pregnancy not specified: Secondary | ICD-10-CM | POA: Insufficient documentation

## 2021-01-28 DIAGNOSIS — R1084 Generalized abdominal pain: Secondary | ICD-10-CM | POA: Diagnosis not present

## 2021-01-28 DIAGNOSIS — O219 Vomiting of pregnancy, unspecified: Secondary | ICD-10-CM | POA: Insufficient documentation

## 2021-01-28 DIAGNOSIS — Z5321 Procedure and treatment not carried out due to patient leaving prior to being seen by health care provider: Secondary | ICD-10-CM | POA: Diagnosis not present

## 2021-01-28 LAB — CBC WITH DIFFERENTIAL/PLATELET
Abs Immature Granulocytes: 0.06 10*3/uL (ref 0.00–0.07)
Basophils Absolute: 0 10*3/uL (ref 0.0–0.1)
Basophils Relative: 0 %
Eosinophils Absolute: 0 10*3/uL (ref 0.0–0.5)
Eosinophils Relative: 0 %
HCT: 28 % — ABNORMAL LOW (ref 36.0–46.0)
Hemoglobin: 9.8 g/dL — ABNORMAL LOW (ref 12.0–15.0)
Immature Granulocytes: 0 %
Lymphocytes Relative: 4 %
Lymphs Abs: 0.6 10*3/uL — ABNORMAL LOW (ref 0.7–4.0)
MCH: 33.2 pg (ref 26.0–34.0)
MCHC: 35 g/dL (ref 30.0–36.0)
MCV: 94.9 fL (ref 80.0–100.0)
Monocytes Absolute: 0.4 10*3/uL (ref 0.1–1.0)
Monocytes Relative: 3 %
Neutro Abs: 13.2 10*3/uL — ABNORMAL HIGH (ref 1.7–7.7)
Neutrophils Relative %: 93 %
Platelets: 240 10*3/uL (ref 150–400)
RBC: 2.95 MIL/uL — ABNORMAL LOW (ref 3.87–5.11)
RDW: 14.3 % (ref 11.5–15.5)
WBC: 14.2 10*3/uL — ABNORMAL HIGH (ref 4.0–10.5)
nRBC: 0 % (ref 0.0–0.2)

## 2021-01-28 MED ORDER — ONDANSETRON 4 MG PO TBDP
4.0000 mg | ORAL_TABLET | Freq: Once | ORAL | Status: AC
  Administered 2021-01-28: 23:00:00 4 mg via ORAL
  Filled 2021-01-28: qty 1

## 2021-01-28 NOTE — ED Triage Notes (Signed)
Pt arrives via [facility],  c/o abdominal pain, nausea and vomiting. Nausea started around 1800, then vomiting, abdominal pain. No fever or cough. Emesis x 9 prior to arrival of EMS. No meds for the same. Family member with GI symptoms as well. 154 palpated, hr 76, sats 100% cbg 154.

## 2021-01-28 NOTE — ED Provider Notes (Signed)
Emergency Medicine Provider Triage Evaluation Note  Christine Reed , a 35 y.o. female  was evaluated in triage.  Pt complains of nausea, vomiting, abdominal pain. Emesis x9 prior to EMS arrival.  Family member sick with GI illness recently.  Review of Systems  Positive: Nausea, vomiting, abdominal pain Negative: fever  Physical Exam  BP 132/84    Pulse 91    Temp 98.4 F (36.9 C) (Oral)    Resp 19    LMP  (LMP Unknown) Comment: had miscarriage in september, no period since then   SpO2 100%    Breastfeeding Unknown   Gen:   Awake, no distress, vomiting in triage   Resp:  Normal effort  MSK:   Moves extremities without difficulty  Other:    Medical Decision Making  Medically screening exam initiated at 11:22 PM.  Appropriate orders placed.  Christine Reed was informed that the remainder of the evaluation will be completed by another provider, this initial triage assessment does not replace that evaluation, and the importance of remaining in the ED until their evaluation is complete.  N/V and generalized abdominal pain/cramping.  Family member with GI illness.  Vomiting in triage.  Labs ordered, covid/flu screen.  Zofran for nausea.   Larene Pickett, PA-C 01/28/21 2328    Orpah Greek, MD 01/28/21 [phone]

## 2021-01-28 NOTE — ED Triage Notes (Signed)
Generalized abdominal cramping, lower back pain. Vomiting, no diarrhea or fevers.

## 2021-01-29 ENCOUNTER — Ambulatory Visit: Admit: 2021-01-29 | Discharge: 2021-01-29 | Disposition: A | Discharge status: Home / Self Care | Payer: MEDICARE

## 2021-01-29 ENCOUNTER — Emergency Department: Admit: 2021-01-29 | Discharge: 2021-01-29 | Disposition: A | Discharge status: Home / Self Care | Payer: MEDICARE

## 2021-01-29 DIAGNOSIS — Z3490 Encounter for supervision of normal pregnancy, unspecified, unspecified trimester: Principal | ICD-10-CM

## 2021-01-29 DIAGNOSIS — O0990 Supervision of high risk pregnancy, unspecified, unspecified trimester: Principal | ICD-10-CM

## 2021-01-29 DIAGNOSIS — R112 Nausea with vomiting, unspecified: Principal | ICD-10-CM

## 2021-01-29 LAB — URINALYSIS WITH CULTURE REFLEX
BACTERIA: NONE SEEN /HPF
BILIRUBIN UA: NEGATIVE
BLOOD UA: NEGATIVE
GLUCOSE UA: NEGATIVE
HYALINE CASTS: 1 /LPF (ref 0–1)
KETONES UA: 10 — AB
LEUKOCYTE ESTERASE UA: NEGATIVE
NITRITE UA: NEGATIVE
PH UA: 6 (ref 5.0–9.0)
RBC UA: 1 /HPF (ref <=4)
SPECIFIC GRAVITY UA: 1.015 (ref 1.003–1.030)
SQUAMOUS EPITHELIAL: 5 /HPF (ref 0–5)
UROBILINOGEN UA: <2
WBC UA: 10 /HPF — ABNORMAL HIGH (ref 0–5)

## 2021-01-29 LAB — CBC W/ AUTO DIFF
BASOPHILS ABSOLUTE COUNT: 0.1 10*9/L (ref 0.0–0.1)
BASOPHILS RELATIVE PERCENT: 0.4 %
EOSINOPHILS ABSOLUTE COUNT: 0 10*9/L (ref 0.0–0.5)
EOSINOPHILS RELATIVE PERCENT: 0 %
HEMATOCRIT: 29.8 % — ABNORMAL LOW (ref 34.0–44.0)
HEMOGLOBIN: 10.1 g/dL — ABNORMAL LOW (ref 11.3–14.9)
LYMPHOCYTES ABSOLUTE COUNT: 0.5 10*9/L — ABNORMAL LOW (ref 1.1–3.6)
LYMPHOCYTES RELATIVE PERCENT: 4 %
MEAN CORPUSCULAR HEMOGLOBIN CONC: 34 g/dL (ref 32.0–36.0)
MEAN CORPUSCULAR HEMOGLOBIN: 32.8 pg — ABNORMAL HIGH (ref 25.9–32.4)
MEAN CORPUSCULAR VOLUME: 96.5 fL — ABNORMAL HIGH (ref 77.6–95.7)
MEAN PLATELET VOLUME: 8.2 fL (ref 6.8–10.7)
MONOCYTES ABSOLUTE COUNT: 0.2 10*9/L — ABNORMAL LOW (ref 0.3–0.8)
MONOCYTES RELATIVE PERCENT: 1.8 %
NEUTROPHILS ABSOLUTE COUNT: 12.6 10*9/L — ABNORMAL HIGH (ref 1.8–7.8)
NEUTROPHILS RELATIVE PERCENT: 93.8 %
PLATELET COUNT: 233 10*9/L (ref 150–450)
RED BLOOD CELL COUNT: 3.09 10*12/L — ABNORMAL LOW (ref 3.95–5.13)
RED CELL DISTRIBUTION WIDTH: 16 % — ABNORMAL HIGH (ref 12.2–15.2)
WBC ADJUSTED: 13.4 10*9/L — ABNORMAL HIGH (ref 3.6–11.2)

## 2021-01-29 LAB — COMPREHENSIVE METABOLIC PANEL
ALBUMIN: 4.1 g/dL (ref 3.4–5.0)
ALKALINE PHOSPHATASE: 48 U/L (ref 46–116)
ALT (SGPT): 10 U/L (ref 10–49)
ALT: 14 U/L (ref 0–44)
ANION GAP: 10 mmol/L (ref 5–14)
AST (SGOT): 16 U/L (ref <=34)
AST: 17 U/L (ref 15–41)
Albumin: 3.9 g/dL (ref 3.5–5.0)
Alkaline Phosphatase: 40 U/L (ref 38–126)
Anion gap: 8 (ref 5–15)
BILIRUBIN TOTAL: 1.4 mg/dL — ABNORMAL HIGH (ref 0.3–1.2)
BLOOD UREA NITROGEN: 18 mg/dL (ref 9–23)
BUN / CREAT RATIO: 12
BUN: 19 mg/dL (ref 6–20)
CALCIUM: 9.9 mg/dL (ref 8.7–10.4)
CHLORIDE: 109 mmol/L — ABNORMAL HIGH (ref 98–107)
CO2: 20 mmol/L — ABNORMAL LOW (ref 22–32)
CO2: 22 mmol/L (ref 20.0–31.0)
CREATININE: 1.53 mg/dL — ABNORMAL HIGH
Calcium: 9.5 mg/dL (ref 8.9–10.3)
Chloride: 110 mmol/L (ref 98–111)
Creatinine, Ser: 1.68 mg/dL — ABNORMAL HIGH (ref 0.44–1.00)
EGFR CKD-EPI (2021) FEMALE: 46 mL/min/{1.73_m2} — ABNORMAL LOW (ref >=60)
GFR, Estimated: 41 mL/min — ABNORMAL LOW (ref >60)
GLUCOSE RANDOM: 139 mg/dL (ref 70–179)
Glucose, Bld: 150 mg/dL — ABNORMAL HIGH (ref 70–99)
POTASSIUM: 4.3 mmol/L (ref 3.4–4.8)
PROTEIN TOTAL: 7.7 g/dL (ref 5.7–8.2)
Potassium: 4.1 mmol/L (ref 3.5–5.1)
SODIUM: 141 mmol/L (ref 135–145)
Sodium: 138 mmol/L (ref 135–145)
Total Bilirubin: 1.7 mg/dL — ABNORMAL HIGH (ref 0.3–1.2)
Total Protein: 7.4 g/dL (ref 6.5–8.1)

## 2021-01-29 LAB — LIPASE: LIPASE: 24 U/L (ref 12–53)

## 2021-01-29 LAB — HCG QUANTITATIVE, BLOOD: GONADOTROPIN, CHORIONIC (HCG) QUANT: 192647 m[IU]/mL

## 2021-01-29 LAB — HCG, QUANTITATIVE, PREGNANCY: hCG, Beta Chain, Quant, S: 227752 m[IU]/mL — ABNORMAL HIGH (ref <5)

## 2021-01-29 LAB — RESP PANEL BY RT-PCR (FLU A&B, COVID) ARPGX2
Influenza A by PCR: NEGATIVE
Influenza B by PCR: NEGATIVE
SARS Coronavirus 2 by RT PCR: NEGATIVE

## 2021-01-29 LAB — LIPASE, BLOOD: Lipase: 23 U/L (ref 11–51)

## 2021-01-29 MED ADMIN — lactated ringers bolus 1,000 mL: 1000 mL | INTRAVENOUS | @ 11:00:00 | Stop: 2021-01-29

## 2021-01-29 MED ADMIN — MORPhine 4 mg/mL injection 4 mg: 4 mg | INTRAVENOUS | @ 08:00:00 | Stop: 2021-01-29

## 2021-01-29 MED ADMIN — lactated ringers bolus 1,000 mL: 1000 mL | INTRAVENOUS | @ 08:00:00 | Stop: 2021-01-29

## 2021-01-29 MED ADMIN — promethazine (PHENERGAN) tablet 12.5 mg: 12.5 mg | ORAL | @ 12:00:00 | Stop: 2021-01-29

## 2021-01-29 MED ADMIN — ondansetron (ZOFRAN) injection 4 mg: 4 mg | INTRAVENOUS | @ 08:00:00 | Stop: 2021-01-29

## 2021-01-29 MED ADMIN — MORPhine 4 mg/mL injection 4 mg: 4 mg | INTRAVENOUS | @ 10:00:00 | Stop: 2021-01-29

## 2021-01-29 MED ADMIN — famotidine (PF) (PEPCID) injection 20 mg: 20 mg | INTRAVENOUS | @ 12:00:00 | Stop: 2021-01-29

## 2021-01-29 NOTE — Unmapped (Signed)
Pt paged on call c/o severe abdominal pain and vomiting. She was calling from the ER at [facility]. Pt kept saying I'm not gonna make it, offered support. She said she couldn't wait around for 12 hours. Per patient report she called 911 and was taken to ED. Notes in care everywhere. She has been given zofran and continues to vomit. Advised she is in the best place for her symptoms right now, the emergency room. Pt stated she was going to have someone pick her up and bring her to [facility]. Explained it's best to stay where she is at [facility] with her severe pain. This Clinical research associate does not know the wait time in the [place] ED.   Offered my support.     Routed message to primary team.

## 2021-01-29 NOTE — Unmapped (Signed)
Pt discharged. Pt given discharge papers and papers explained. Pt in NAD at this time

## 2021-01-29 NOTE — Unmapped (Signed)
Addended by: Soyla Dryer A on: 01/29/2021 10:52 AM     Modules accepted: Orders

## 2021-01-29 NOTE — Unmapped (Signed)
Pt here with c/o abd pain  sts can not keep anything down

## 2021-01-29 NOTE — Unmapped (Signed)
[facility]  Emergency Department Progress Note    January 29, 2021 8:34 AM    I supervised care provided by the resident/APP. We have discussed the case, I have reviewed the note and I agree with the plan of treatment.    Recent VS:  Recent Patient Data       01/29/2021  0220 01/29/2021  0224          BP: -- 156/83      Pulse: 92 89      Temp: -- 37.1 ??C (98.8 ??F)      Resp: -- 18      SpO2: 100 % 100 %          Elaine Koch is a 35 yo woman with a pmhx kidney and pancreas transplant who presents for evaluation of nausea vomiting in the setting of a sick contact.  Patient also with new diagnosis of pregnancy.  She is G3 P0-1-1-1.  Approximately 7 weeks by ultrasound.  Nausea vomiting likely viral in nature as opposed to complication of pregnancy however told patient to monitor for both.  We will plan for p.o. challenge.  If able to tolerate p.o., anticipate discharge with OB/GYN follow-up.     ED Clinical Impression     Final diagnoses:   Nausea and vomiting, unspecified vomiting type (Primary)   Pregnancy, unspecified gestational age

## 2021-01-29 NOTE — Unmapped (Signed)
Gen abd pain, onset abt 8hrs ago. +n/v.

## 2021-01-29 NOTE — Unmapped (Signed)
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 Will need to be seen by OBGYN (high risk) and Nephrology (Dr Glade Lloyd) upon discharge. Both referrals placed.

## 2021-01-29 NOTE — Unmapped (Signed)
8:02 AM -  I received sign out from my colleague Dr. Mylinda Latina.    Illness Severity: stable    Patient Summary: Elaine Koch is a 35 y.o. 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  Patient also reports crampy abdominal pain.  She is concerned she may at the same illness as her mother, who currently has nausea, vomiting, diarrhea.  Denies fevers. Pt found to be [redacted]wk pregnant. Received meds/fluids, re-eval after po challenge.    ED Course as of 01/29/21 0805   Mon Jan 29, 2021   0801 I have reevaluated the patient who feels better and ready for discharge.  On review of her labs, she has mild leukocytosis, elevated creatinine near baseline, but now she is post 2 L of fluid.  Urine is negative for infection.  Patient is pregnant about 7 weeks by transvaginal ultrasound.  No pelvic pain or vaginal bleeding at this time.  She already has follow-up with our OB/GYN department here.  Discussed strict return precautions, all questions answered.       Vitals:    01/29/21 0220 01/29/21 0224   BP:  156/83   Pulse: 92 89   Resp:  18   Temp:  37.1 ??C (98.8 ??F)   TempSrc:  Oral   SpO2: 100% 100%       US Pelvic (Transvaginal) - PREGNANCY RELATED   Preliminary Result      Single live intrauterine pregnancy with an estimated gestational age of [redacted] weeks 1 days.         Spectral doppl was performed.   Additional trwere not obtainedes wPlease see below for data measurements:    data m urements:      HCG 192,647       Yolk sac 0.3 cm   Crown rump length 1.1 yesCardiac activity yes   FHR: 147      Uterus: Sagittal 11.8 cm; AP 6.9 cm; Transverse 7.7 cm       Right ovary: Sagittal 2.5 cm; AP 2.0 cm; Transverse 2.0 cm   Left ovary: Sagittal 5.0 cm; AP 3.2 cm; Transverse 3.6 cm       Free fluid visualized:no       Please note that this examination was not performed for purposes of assessing fetal anatomy and/or the placenta and is not diagnostic for these purposes.  This is not a surrogate for and should not replace dedicated fetal anatomy scan.             Lab Results   Component Value Date    WBC 13.4 (H) 01/29/2021    HGB 10.1 (L) 01/29/2021    HCT 29.8 (L) 01/29/2021    PLT 233 01/29/2021       Lab Results   Component Value Date    NA 141 01/29/2021    K 4.3 01/29/2021    CL 109 (H) 01/29/2021    CO2 22.0 01/29/2021    BUN 18 01/29/2021    CREATININE 1.53 (H) 01/29/2021    GLU 139 01/29/2021    CALCIUM 9.9 01/29/2021    MG 1.6 12/21/2020    PHOS 2.5 09/07/2020       Lab Results   Component Value Date    BILITOT 1.4 (H) 01/29/2021    BILIDIR 0.20 02/20/2020    PROT 7.7 01/29/2021    ALBUMIN 4.1 01/29/2021    ALT 10  01/29/2021    AST 16 01/29/2021    ALKPHOS 48 01/29/2021    GGT 18 05/02/2019       Lab Results   Component Value Date    INR 1.08 02/17/2020    APTT 34.3 02/17/2020

## 2021-01-29 NOTE — Unmapped (Signed)
[facility]  Emergency Department Provider Note        ED Clinical Impression      Final diagnoses:   Nausea and vomiting, unspecified vomiting type (Primary)   Pregnancy, unspecified gestational age           Impression, ED Course, Assessment and Plan      Impression: Elaine Koch is a 35 y.o. 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  Patient also reports crampy abdominal pain.  She is concerned she may at the same illness as her mother, who currently has nausea, vomiting, diarrhea.  Denies fevers.    Vitals significant for BP 150s/80s, otherwise within normal limits.  Patient is well-appearing, nontoxic, no acute distress.  Physical exam overall reassuring.  Abdomen soft, nondistended, nontender.    Patient been around her mother recently, who is sick with similar symptoms including nausea, vomiting, diarrhea.  She suspects that this may be related.  Suspect that this may be a viral syndrome versus food poisoning.  Abdomen is very soft, nondistended, nontender, decreasing level suspicion for appendicitis, SBO, or other intra-abdominal pathology.  UTI certainly a possibility.    Will obtain labs, in addition to swab for flu, COVID-19, RSV.        ED Course:    ED Course as of 01/29/21 0649   Mon Jan 29, 2021   4540 Work-up has been overall reassuring.  Creatinine is 1.53, around patient's baseline.  UA negative for signs of UTI.  Unexpectedly, hCG is elevated to 190,000.  Patient was not intending to get pregnant.  She was actually recently admitted to [facility] in late October for concern for heterotopic pregnancy.  She reports having sex twice since then with a condom.  Endovaginal ultrasound was obtained which shows pregnancy around 7 weeks and 1 day.  She currently does not have any vaginal bleeding, cramping.  Given high risk pregnancy with history of transplant, will refer to OB/GYN.     Patient continues to be overall nontoxic-appearing.  She has felt slightly ill again after drinking symptom drill, so we will give dose of Pepcid, add p.o. Phenergan.  If able to tolerate p.o., anticipate discharge.  Patient signed out to oncoming resident.           Additional Medical Decision Making     I have reviewed the vital signs and the nursing notes. Labs and radiology results that were available during my care of the patient were independently reviewed by me and considered in my medical decision making. I discussed the case with Dr. Maggie Schwalbe, ED Attending.      History        Chief Complaint  Abdominal Pain      HPI   Elaine Koch is a 35 y.o. 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  Patient also reports crampy abdominal pain.  She is concerned she may at the same illness as her mother, who currently has nausea, vomiting, diarrhea.  Denies fevers.  Denies chest pain, shortness of breath.      Past Medical History:   Diagnosis Date   ??? Chronic hypertension during pregnancy, antepartum 07/22/2015    Overview:  Methyldopa recommended per nephrologist if needed   ??? Diabetes mellitus type 1 (CMS-HCC)    ???  ESRD (end stage renal disease) (CMS-HCC)    ??? History of pre-eclampsia 10/24/2016   ??? 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       Patient Active Problem List   Diagnosis   ??? Anemia, chronic disease   ??? Diabetic nephropathy (CMS-HCC)   ??? Diabetic retinopathy (CMS-HCC)   ??? Nephrotic syndrome   ??? Type 1 diabetes mellitus (CMS-HCC)   ??? Sickle cell trait (CMS-HCC)   ??? Other secondary hypertension   ??? History of blood transfusion   ??? Cardiomyopathy (CMS-HCC)   ??? History of simultaneous kidney and pancreas transplant (CMS-HCC)   ??? Hypotension   ??? 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   ??? AKI (acute kidney injury) (CMS-HCC)   ??? Nausea & vomiting   ??? Diarrhea   ??? Neutropenia associated with infection (CMS-HCC)   ??? Neutropenia, drug-induced (CMS-HCC)   ??? Clostridium difficile infection   ??? Fever   ??? COVID-19 in immunocompromised patient (CMS-HCC)   ??? Acute pyelonephritis   ??? Pregnancy, location unknown       Past Surgical History:   Procedure Laterality Date   ??? AV FISTULA PLACEMENT  2018   ??? CESAREAN SECTION     ??? PR COLONOSCOPY FLX DX W/COLLJ SPEC WHEN PFRMD N/A 02/14/2020    Procedure: COLONOSCOPY, FLEXIBLE, PROXIMAL TO SPLENIC FLEXURE; DIAGNOSTIC, W/WO COLLECTION SPECIMEN BY BRUSH OR WASH;  Surgeon: Carmon Ginsberg, MD;  Location: [facility];  Service: Gastroenterology   ??? PR COLONOSCOPY FLX DX W/COLLJ SPEC WHEN PFRMD N/A 06/19/2020    Procedure: COLONOSCOPY, FLEXIBLE, PROXIMAL TO SPLENIC FLEXURE; DIAGNOSTIC, W/WO COLLECTION SPECIMEN BY BRUSH OR WASH;  Surgeon: Carmon Ginsberg, MD;  Location: [facility];  Service: Gastroenterology   ??? PR FECAL MICROBIOTA PREP INSTIL N/A 02/14/2020    Procedure: PREP W INSTILLATION FECAL MICROBIOTA, ANY METHOD;  Surgeon: Carmon Ginsberg, MD;  [facility]: [facility];  Service: Gastroenterology   ??? PR LAP,DIAGNOSTIC ABDOMEN N/A 11/17/2020    Procedure: DIAGNOSTIC AND OR OPERATIVE LAPAROSCOPY;  Surgeon: Estil Daft, MD;  Location: MAIN OR [facility];  Service: Womens Primary Gynecology   ??? PR PREPARE FECAL MICROBIOTA FOR INSTILLATION  06/19/2020    Procedure: PREP FECAL MICROBIOTA FOR INSTILLATION, INCLUDING ASSESSMENT OF DONOR SPECIMEN;  Surgeon: Carmon Ginsberg, MD;  Location: [facility];  Service: Gastroenterology   ??? PR SURG RX MISSED ABORTN,1ST TRI N/A 11/17/2020    Procedure: VACUUM ASPIRATION;  Surgeon: Estil Daft, MD;  Location: MAIN OR [facility];  Service: [facility] Primary Gynecology   ??? PR TRANSPLANT ALLOGRAFT PANCREAS N/A 05/03/2019 Procedure: TRANSPLANTATION OF PANCREATIC ALLOGRAFT;  Surgeon: Leona Carry, MD;  Location: MAIN OR [facility];  Service: Transplant   ??? PR TRANSPLANT,PREP CADAVER RENAL GRAFT N/A 05/03/2019    Procedure: [facility] STD PREP CAD DONR RENAL ALLOGFT PRIOR TO TRNSPLNT, INCL DISSEC/REM PERINEPH FAT, DIAPH/RTPER ATTAC;  Surgeon: Leona Carry, MD;  Location: MAIN OR [facility];  Service: Transplant   ??? PR TRANSPLANT,PREP DONOR PANCREAS N/A 05/03/2019    Procedure: [facility] STANDARD PREPARATION OF CADAVER DONOR PANCREAS ALLOGRAFT PRIOR TO TRANSPLANTATION;  Surgeon: Leona Carry, MD;  Location: MA[facility] OR [facility];  Service: Transplant   ??? PR TRANSPLANTATION OF KIDNEY N/A 05/03/2019    Procedure: RENAL ALLOTRANSPLANTATION, IMPLANTATION OF GRAFT; WITHOUT RECIPIENT NEPHRECTOMY;  Surgeon: Leona Carry, MD;  Location: MAIN OR [facility];  Service: Transplant   ??? TONSILLECTOMY  Current Facility-Administered Medications:   ???  famotidine (PF) (PEPCID) injection 20 mg, 20 mg, Intravenous, Once, Clancy Gourd, MD  ???  promethazine (PHENERGAN) tablet 12.5 mg, 12.5 mg, Oral, Once, Clancy Gourd, MD    Current Outpatient Medications:   ???  aspirin (ECOTRIN) 81 MG tablet, Take 1 tablet (81 mg total) by mouth daily., Disp: 30 tablet, Rfl: 11  ???  azaTHIOprine (IMURAN) 50 mg tablet, Take 2 tablets (100 mg total) by mouth daily, Disp: 60 tablet, Rfl: 11  ???  sodium bicarbonate 650 mg tablet, Take 2 tablets (1,300 mg total) by mouth Three (3) times a day., Disp: 180 tablet, Rfl: 3  ???  tacrolimus (ENVARSUS XR) 4 mg Tb24 extended release tablet, Take 4 tablets (16 mg total) by mouth daily., Disp: 120 tablet, Rfl: 11    Allergies  Iodinated contrast media, Nickel, Propranolol, Eye irrigating solution [ophthalmic irrigation solution], Iodine, Naltrexone, and Uni-cortrom    Family History   Problem Relation Age of Onset   ??? Diabetes Mother    ??? Diabetes Father    ??? Cancer Maternal Grandmother    ??? Diabetes Maternal Grandfather    ??? Diabetes Paternal Grandmother        Social History  Social History     Tobacco Use   ??? Smoking status: Never   ??? Smokeless tobacco: Never   Vaping Use   ??? Vaping Use: Never used   Substance Use Topics   ??? Alcohol use: Never   ??? Drug use: Never       Review of Systems  Constitutional: Negative for fevers, chills  Eyes: Negative for visual changes.  ENT: Negative for sore throat.  Cardiovascular: Negative for chest pain.  Respiratory: Negative for shortness of breath.  Gastrointestinal: Positive for nausea and vomiting.  Genitourinary: Negative for dysuria.   Musculoskeletal: Negative for back pain.  Skin: Negative for rash.  Neurological: Negative for headaches, focal weakness or numbness.       Physical Exam     ED Triage Vitals   Enc Vitals Group      BP 01/29/21 0224 156/83      Heart Rate 01/29/21 0220 92      SpO2 Pulse --       Resp 01/29/21 0224 18      Temp 01/29/21 0224 37.1 ??C (98.8 ??F)      Temp Source 01/29/21 0224 Oral      SpO2 01/29/21 0220 100 %      Weight --       Height --       Head Circumference --       Peak Flow --       Pain Score --       Pain Loc --       Pain Edu? --       Excl. in GC? --        Constitutional: Well-appearing, no acute distress.  Eyes: Conjunctivae are normal.  ENT       Head: Normocephalic and atraumatic.       Nose: No congestion.       Mouth/Throat: Mucous membranes are moist.       Neck: No stridor.  Hematological/Lymphatic/Immunilogical: No cervical lymphadenopathy.  Cardiovascular: RRR, no murmurs; normal and symmetric distal pulses are present in all extremities.  Respiratory: Lungs clear to ascultation bilaterally, no wheezes, crackles, rales; no increased work of breathing.   Gastrointestinal: Soft, non-distended, non-tender; no rebound tenderness or guarding.   Musculoskeletal:  Normal range of motion in all extremities. No lower extremity edema or tenderness.   Neurologic: Cranial nerves II-XII intact. Normal speech and language. No gross focal neurologic deficits are appreciated.  Skin: Skin is warm, dry and intact. No rash noted.  Psychiatric: Mood and affect are normal. Speech and behavior are normal.       Radiology     US Pelvic (Transvaginal) - PREGNANCY RELATED   Preliminary Result      Single live intrauterine pregnancy with an estimated gestational age of [redacted] weeks 1 days.         Spectral doppl was performed.   Additional trwere not obtainedes wPlease see below for data measurements:    data m urements:      HCG 192,647       Yolk sac 0.3 cm   Crown rump length 1.1 yesCardiac activity yes   FHR: 147      Uterus: Sagittal 11.8 cm; AP 6.9 cm; Transverse 7.7 cm       Right ovary: Sagittal 2.5 cm; AP 2.0 cm; Transverse 2.0 cm   Left ovary: Sagittal 5.0 cm; AP 3.2 cm; Transverse 3.6 cm       Free fluid visualized:no       Please note that this examination was not performed for purposes of assessing fetal anatomy and/or the placenta and is not diagnostic for these purposes.  This is not a surrogate for and should not replace dedicated fetal anatomy scan.                  Pertinent labs & imaging results that were available during my care of the patient were reviewed by me and considered in my medical decision making (see chart for details).     Please note- This chart has been created using [employer]. Chart creation errors have been sought, but may not always be located and such creation errors, especially pronoun confusion, do NOT reflect on the standard of medical care.           Clancy Gourd, MD  Resident  01/29/21 [phone]

## 2021-01-29 NOTE — ED Notes (Signed)
Pt decided to leave 

## 2021-01-30 MED ORDER — NITROFURANTOIN MONOHYDRATE/MACROCRYSTALS 100 MG CAPSULE
ORAL_CAPSULE | Freq: Two times a day (BID) | ORAL | 0 refills | 7 days | Status: CP
Start: 2021-01-30 — End: 2021-02-06
  Filled 2021-01-31: qty 14, 7d supply, fill #0

## 2021-01-31 ENCOUNTER — Ambulatory Visit: Admit: 2021-01-31 | Discharge: 2021-02-01 | Payer: MEDICARE

## 2021-01-31 NOTE — Unmapped (Signed)
Per EMAP, has called pt and taken care of any further needs. Nothing more needed for this RN to do.

## 2021-01-31 NOTE — Unmapped (Signed)
???   Patient is currently 7 wks 3 days with EDD of 09/16/2021  ??? Patient is initiating care with [facility] in early pregnancy  ??? Patient reports UNKNOWN LMP . Additional notes related to LMP/EDD assignment (if applicable):  ??? Patient's BMI 29  Patient's last pap smear was NORMAL in year unknown      ??? New to nurse interview completed with patient. History obtained and abstracted to Epic.   ??? Confirmed/completed patient enrollment in MyChart. Advised patient to utilize MyChart for communication.   ??? Patient was given clinic addresses, scheduling lines, and nurse advice lines.  ??? Provided patient with [email or web address] link  ??? OB book sent via MyChart.    o Reviewed how to contact Clinic and Nurse triage on page 2.   o Reviewed with patient page 3, Contacting Your Provider.    o Reviewed with patient Table of Contents with specific instruction to review Common Concerns and Recommended Treatment on page 12 to be aware of over the counter medications and treatments safe in pregnancy.   ??? Reviewed Genetic Testing and provided patient with links to resources.  ??? Orders placed  ??? Patient verbalized understanding of all information given and did not have any questions. Patient transferred to [facility] to schedule initial OB appointment with selected provider group and viability ultrasound.  ??? Patient Access Center member spoken to:

## 2021-01-31 NOTE — Unmapped (Signed)
EMAP test result follow-up note    January 30, 2021 4:44 PM     I was contacted by the ED resource nurse Tacey Heap) with regard to [employer]. She was seen in the [facility] Emergency Department on 12/19 at which time a clean catch urine culture was done which has returned positive for 10-50k coagulase negative Staph species.    I have reviewed the patient's chart from this ED visit. 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  Of note she was incidentally found to be pregnant, confirmed by TVUS to be [redacted] weeks pregnant.  Given MFM referral.  UA at that time was very benign, with only 10 WBC and no nitrites, leuk esterase, or bacteria.      Given the patient is a complex and high risk MFM patient, we will err on the side of caution and go ahead and treat this as asymptomatic bacteria.  Macrobid x7d.    I personally spoke with the patient and sent her rx to her preferred pharmacy.  She has an MFM follow up appointment soon.

## 2021-02-06 ENCOUNTER — Encounter
Admit: 2021-02-06 | Discharge: 2021-02-07 | Payer: MEDICARE | Attending: Maternal & Fetal Medicine | Primary: Maternal & Fetal Medicine

## 2021-02-06 ENCOUNTER — Ambulatory Visit
Admit: 2021-02-06 | Discharge: 2021-02-07 | Payer: MEDICARE | Attending: Maternal & Fetal Medicine | Primary: Maternal & Fetal Medicine

## 2021-02-06 DIAGNOSIS — Z1159 Encounter for screening for other viral diseases: Principal | ICD-10-CM

## 2021-02-06 DIAGNOSIS — Z79899 Other long term (current) drug therapy: Principal | ICD-10-CM

## 2021-02-06 DIAGNOSIS — Z940 Kidney transplant status: Principal | ICD-10-CM

## 2021-02-06 DIAGNOSIS — Z9483 Pancreas transplant status: Principal | ICD-10-CM

## 2021-02-06 DIAGNOSIS — I428 Other cardiomyopathies: Principal | ICD-10-CM

## 2021-02-06 DIAGNOSIS — N184 Chronic kidney disease, stage 4 (severe): Principal | ICD-10-CM

## 2021-02-06 DIAGNOSIS — U071 COVID-19 in immunocompromised patient (CMS-HCC): Principal | ICD-10-CM

## 2021-02-06 DIAGNOSIS — D638 Anemia in other chronic diseases classified elsewhere: Principal | ICD-10-CM

## 2021-02-06 DIAGNOSIS — E0822 Diabetes mellitus due to underlying condition with diabetic chronic kidney disease: Principal | ICD-10-CM

## 2021-02-06 DIAGNOSIS — D849 Immunodeficiency, unspecified: Principal | ICD-10-CM

## 2021-02-06 DIAGNOSIS — E0821 Diabetes mellitus due to underlying condition with diabetic nephropathy: Principal | ICD-10-CM

## 2021-02-06 DIAGNOSIS — O0991 Supervision of high risk pregnancy, unspecified, first trimester: Principal | ICD-10-CM

## 2021-02-06 DIAGNOSIS — O09891 Supervision of other high risk pregnancies, first trimester: Principal | ICD-10-CM

## 2021-02-06 DIAGNOSIS — O34219 Maternal care for unspecified type scar from previous cesarean delivery: Principal | ICD-10-CM

## 2021-02-06 DIAGNOSIS — E08319 Diabetes mellitus due to underlying condition with unspecified diabetic retinopathy without macular edema: Principal | ICD-10-CM

## 2021-02-06 DIAGNOSIS — Z7289 Other problems related to lifestyle: Principal | ICD-10-CM

## 2021-02-06 DIAGNOSIS — N189 Chronic kidney disease, unspecified: Principal | ICD-10-CM

## 2021-02-06 DIAGNOSIS — I158 Other secondary hypertension: Principal | ICD-10-CM

## 2021-02-06 LAB — IRON & TIBC
IRON SATURATION: 33 % (ref 20–55)
IRON: 86 ug/dL
TOTAL IRON BINDING CAPACITY: 260 ug/dL (ref 250–425)

## 2021-02-06 LAB — CBC
HEMATOCRIT: 27.2 % — ABNORMAL LOW (ref 34.0–44.0)
HEMOGLOBIN: 9.3 g/dL — ABNORMAL LOW (ref 11.3–14.9)
MEAN CORPUSCULAR HEMOGLOBIN CONC: 34.3 g/dL (ref 32.0–36.0)
MEAN CORPUSCULAR HEMOGLOBIN: 33 pg — ABNORMAL HIGH (ref 25.9–32.4)
MEAN CORPUSCULAR VOLUME: 96.4 fL — ABNORMAL HIGH (ref 77.6–95.7)
MEAN PLATELET VOLUME: 8.2 fL (ref 6.8–10.7)
PLATELET COUNT: 245 10*9/L (ref 150–450)
RED BLOOD CELL COUNT: 2.82 10*12/L — ABNORMAL LOW (ref 3.95–5.13)
RED CELL DISTRIBUTION WIDTH: 14.9 % (ref 12.2–15.2)
WBC ADJUSTED: 6.8 10*9/L (ref 3.6–11.2)

## 2021-02-06 LAB — VITAMIN B12: VITAMIN B-12: 525 pg/mL (ref 211–911)

## 2021-02-06 LAB — FOLATE: FOLATE: 9 ng/mL (ref >=5.4)

## 2021-02-06 LAB — FERRITIN: FERRITIN: 666.2 ng/mL — ABNORMAL HIGH

## 2021-02-06 MED ORDER — METRONIDAZOLE 1 % TOPICAL GEL
Freq: Every day | TOPICAL | 1 refills | 0 days | Status: CP
Start: 2021-02-06 — End: 2022-02-06

## 2021-02-06 MED ORDER — PRENATAL VITAMIN NO.76-IRON,CARBONYL 29 MG IRON-FOLIC ACID 1 MG TABLET
ORAL_TABLET | Freq: Every day | ORAL | 5 refills | 90 days | Status: CP
Start: 2021-02-06 — End: ?

## 2021-02-06 NOTE — Unmapped (Signed)
Last echo 10/21  No CP or SOB  Sleeps on 2 pillows  Exercise tolerance is stable, is able work

## 2021-02-06 NOTE — Unmapped (Signed)
UA negative for protein today

## 2021-02-06 NOTE — Unmapped (Signed)
116/71 no  meds

## 2021-02-06 NOTE — Unmapped (Signed)
Recent eye exam ~ 1 year ago-still has retinopathy uses glasses at night

## 2021-02-06 NOTE — Unmapped (Signed)
Has gotten COVID vaccine, needs next booster

## 2021-02-06 NOTE — Unmapped (Signed)
Book Caring for You and Your Baby given to patient and reviewed how to contact Clinic and Nurse triage on page 2. Reviewed with patient page 3, Contacting Your Provider.  Reviewed with patient Table of Contents with specific instruction to review Common Concerns and Recommended Treatment on page 12 to be aware of over the counter medications and treatments safe in pregnancy.   A few clinic business cards were given to patient.  Also, a sheet on different class that are offered at the hospital throughout their pregnancy was given.

## 2021-02-06 NOTE — Unmapped (Signed)
Not taking PNV iron or folate

## 2021-02-06 NOTE — Unmapped (Addendum)
Imuran Tacrolimus  ASA  Sodium bicarb  Sees transplant doctor every 6 months  Discussed importance of seeing transplant team

## 2021-02-07 LAB — SYPHILIS SCREEN: SYPHILIS RPR SCREEN: NONREACTIVE

## 2021-02-07 LAB — HIV ANTIGEN/ANTIBODY COMBO: HIV ANTIGEN/ANTIBODY COMBO: NONREACTIVE

## 2021-02-07 LAB — RUBELLA ANTIBODY, IGG: RUBELLA IGG SCREEN: POSITIVE

## 2021-02-07 LAB — HEPATITIS C ANTIBODY: HEPATITIS C ANTIBODY: NONREACTIVE

## 2021-02-07 LAB — HEPATITIS B SURFACE ANTIGEN: HEPATITIS B SURFACE ANTIGEN: NONREACTIVE

## 2021-02-07 NOTE — Unmapped (Signed)
Discussed delivery mode; desires repeat c section

## 2021-02-07 NOTE — Unmapped (Signed)
Patient completed EPDS of 10 and GAD7 of 10 at visit today.

## 2021-02-07 NOTE — Unmapped (Signed)
[facility]                                                                         New Prenatal Note  History present of Illness   [name] is an 35 y.o. [id number] at [redacted]w[redacted]d with Estimated Date of Delivery: 09/16/21, who is seen in for a new OB visit. She denies vaginal bleeding, and reports minimal nausea and vomiting. This is an unplanned pregnancy.    Obstetrical History  OB History   Gravida Para Term Preterm AB Living   3 1 0 1 1 1    SAB IAB Ectopic Molar Multiple Live Births   0 0 0 0 0 1      # Outcome Date GA Lbr Len/2nd Weight Sex Delivery Anes PTL Lv   3 Current            2 AB 11/02/20 [redacted]w[redacted]d          1 Preterm 2017 [redacted]w[redacted]d    CS-Unspec   LIV       Gynecologic History:   + hx of abnormal paps. Denies hx of sexually transmitted illnesses in particular herpes simplex virus.    Past Medical History  Past Medical History:   Diagnosis Date   ??? Chronic hypertension during pregnancy, antepartum 07/22/2015    Overview:  Methyldopa recommended per nephrologist if needed   ??? Diabetes mellitus type 1 (CMS-HCC)    ??? ESRD (end stage renal disease) (CMS-HCC)    ??? History of pre-eclampsia 10/24/2016   ??? 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 AV FISTULA PLACEMENT  2018   ??? CESAREAN SECTION     ??? PR COLONOSCOPY FLX DX W/COLLJ SPEC WHEN PFRMD N/A 02/14/2020    Procedure: COLONOSCOPY, FLEXIBLE, PROXIMAL TO SPLENIC FLEXURE; DIAGNOSTIC, W/WO COLLECTION SPECIMEN BY BRUSH OR WASH;  Surgeon: Elaine Ginsberg, MD;  [facility];  Service: Gastroenterology   ??? PR COLONOSCOPY FLX DX W/COLLJ SPEC WHEN PFRMD N/A 06/19/2020    Procedure: COLONOSCOPY, FLEXIBLE, PROXIMAL TO SPLENIC FLEXURE; DIAGNOSTIC, W/WO COLLECTION SPECIMEN BY BRUSH OR WASH;  Surgeon: Elaine Ginsberg, MD;  Location: [facility];  Service: Gastroenterology   ??? PR FECAL MICROBIOTA PREP INSTIL N/A 02/14/2020    Procedure: PREP W INSTILLATION FECAL MICROBIOTA, ANY METHOD;  Surgeon: Elaine Ginsberg, MD;  Location: [facility];  Service: Gastroenterology   ??? PR LAP,DIAGNOSTIC ABDOMEN N/A 11/17/2020    Procedure: DIAGNOSTIC AND OR OPERATIVE LAPAROSCOPY;  Surgeon: Elaine Daft, MD;  Location: MAIN OR [facility];  Service: Womens Primary Gynecology   ??? PR PREPARE FECAL MICROBIOTA FOR INSTILLATION  06/19/2020    Procedure: PREP FECAL MICROBIOTA FOR INSTILLATION, INCLUDING ASSESSMENT OF DONOR SPECIMEN;  Surgeon: Elaine Ginsberg, MD;  [facility];  Service: Gastroenterology   ??? PR SURG RX MISSED ABORTN,1ST TRI N/A 11/17/2020    Procedure: VACUUM ASPIRATION;  Surgeon: Elaine Daft, MD;  Location: MAIN OR [facility];  Service: [facility] Primary Gynecology   ??? PR TRANSPLANT ALLOGRAFT PANCREAS N/A 05/03/2019    Procedure:  TRANSPLANTATION OF PANCREATIC ALLOGRAFT;  Surgeon: Elaine Carry, MD;  Location: MAIN OR [facility];  Service: Transplant   ??? PR TRANSPLANT,PREP CADAVER RENAL GRAFT N/A 05/03/2019    Procedure: [facility] STD PREP CAD DONR RENAL ALLOGFT PRIOR TO TRNSPLNT, INCL DISSEC/REM PERINEPH FAT, DIAPH/RTPER ATTAC;  Surgeon: Elaine Carry, MD;  Location: MAIN OR [facility];  Service: Transplant   ??? PR TRANSPLANT,PREP DONOR PANCREAS N/A 05/03/2019    Procedure: [facility] STANDARD PREPARATION OF CADAVER DONOR PANCREAS ALLOGRAFT PRIOR TO TRANSPLANTATION;  Surgeon: Elaine Carry, MD;  Location: MAIN OR [facility];  Service: Transplant   ??? PR TRANSPLANTATION OF KIDNEY N/A 05/03/2019    Procedure: RENAL ALLOTRANSPLANTATION, IMPLANTATION OF GRAFT; WITHOUT RECIPIENT NEPHRECTOMY;  Surgeon: Elaine Carry, MD;  Location: MAIN OR [facility];  Service: Transplant   ??? TONSILLECTOMY         Social History  Social History     Socioeconomic History   ??? Marital status: Single   ??? Number of children: 1   ??? Highest education level: Some college, no degree   Tobacco Use   ??? Smoking status: Never   ??? Smokeless tobacco: Never   Vaping Use   ??? Vaping Use: Never used   Substance and Sexual Activity   ??? Alcohol use: Never   ??? Drug use: Never   ??? Sexual activity: Not Currently     Partners: Male       Medications  Outpatient Medications Marked as Taking for the 02/06/21 encounter (Office Visit) with Elaine Levan, MD   Medication Sig Dispense Refill   ??? aspirin (ECOTRIN) 81 MG tablet Take 1 tablet (81 mg total) by mouth daily. 30 tablet 11   ??? azaTHIOprine (IMURAN) 50 mg tablet Take 2 tablets (100 mg total) by mouth daily 60 tablet 11   ??? nitrofurantoin, macrocrystal-monohydrate, (MACROBID) 100 MG capsule Take 1 capsule (100 mg total) by mouth Two (2) times a day for 7 days. 14 capsule 0   ??? sodium bicarbonate 650 mg tablet Take 2 tablets (1,300 mg total) by mouth Three (3) times a day. 180 tablet 3   ??? tacrolimus (ENVARSUS XR) 4 mg Tb24 extended release tablet Take 4 tablets (16 mg total) by mouth daily. 120 tablet 11       Allergies  Iodinated contrast media, Nickel, Propranolol, Eye irrigating solution [ophthalmic irrigation solution], Iodine, Naltrexone, and Uni-cortrom    Review of Systems  A complete ROS was performed with pertinent positives/negatives noted in the HPI. All other systems reviewed were negative.    Objective  Immunization History   Administered Date(s) Administered   ??? COVID-19 VACC,MRNA,(PFIZER)(PF) 04/29/2019, 08/06/2019, 10/13/2019, 03/23/2020   ??? DTaP, Unspecified Formulation 03/15/1988, 05/17/1988, 12/30/1988, 06/03/1992, 01/10/1998   ??? HPV Quadrivalent (Gardasil) 05/28/2012   ??? Hepatitis B Vaccine, Unspecified Formulation 11/22/1998, 01/02/1999, 05/30/1999   ??? Hepatitis B, Adult 12/10/2017   ??? HiB, unspecified 12/30/1988   ??? Influenza Vaccine Quad (IIV4 PF) 35mo+ injectable 12/03/2005, 10/17/2015, 01/29/2019, 11/26/2019   ??? Influenza Virus Vaccine, unspecified formulation 01/20/2000, 01/18/2005, 11/12/2016, 01/31/2019   ??? MMR 12/30/1988, 06/03/1992   ??? Meningococcal Conjugate MCV4P 05/27/2005   ??? PNEUMOCOCCAL POLYSACCHARIDE 23 01/13/2009, 12/20/2016   ??? Polio Virus Vaccine, Unspecified Formulation 01/11/1988, 03/15/1988, 12/30/1988, 06/03/1992   ??? TdaP 11/26/2005, 10/17/2015   ??? Tetanus-Diptheria Toxoids-TD(TDVAX),Asdorbed,2LF(IM) 04/20/1999     Lab Results   Component Value Date    Antibody Screen NEG 11/17/2020    Rubella IgG Scr Positive 03/26/2018    Varicella IgG Positive 05/02/2019  Urine Glucose: Negative  Urine Protein: Negative    General Appearance No acute distress, well appearing, and well nourished.   Pulmonary Normal work of breathing.   Cardiovascular  Breast exame Normal rate.  No masses or skin changes; symmetric      Gastronintestinal Abdomen soft, gravid, no rebound or guarding.   Genitourinary   External Genitalia: Normal female genitalia  Urethral Meatus: Normal caliber and position  Urethra: Midline, no masses  Bladder: Well-suspended, nontender  Vagina: Well-rugated, There is a thin white discharge vaginal discharge that is frothy. and There is a thin white discharge vaginal discharge that is malodorous., no lesions.  Cervix: No lesions, normal size and consistency; no cervical motion tenderness   Adnexa/Parametria: No masses; no parametrial nodularity; no tenderness  Uterus:Gravid; nontender   Muskuloskeletal Normal gait, grossly normal range of motion   Neurologic Alert and oriented to person, place, and time   Psychiatric  Integumentary Mood and affect within normal limits  Skin is warm, dry, no rash noted   Imaging  None    Laboratory results from this encounter  Results for orders placed or performed in visit on 02/06/21   POCT Urinalysis Dipstick   Result Value Ref Range    Spec Gravity/POC 1.015 1.003 - 1.030    PH/POC 6.0 5.0 - 9.0    Leuk Esterase/POC Negative Negative    Nitrite/POC Negative Negative    Protein/POC Negative Negative    UA Glucose/POC Negative Negative    Ketones, POC Negative Negative    Bilirubin/POC Negative Negative    Blood/POC Negative Negative    Urobilinogen/POC 0.2 0.2 - 1.0 mg/dL       Assessment and Plan:  Elaine Koch is a 35 y.o. [id number] at [redacted]w[redacted]d who is seen to establish/transfer prenatal care.     Other secondary hypertension  116/71 no  meds    Cardiomyopathy (CMS-HCC)  Last echo 10/21  No CP or SOB  Sleeps on 2 pillows  Exercise tolerance is stable, is able work     Diabetic nephropathy (CMS-HCC)  UA negative for protein today    Diabetic retinopathy (CMS-HCC)  Recent eye exam ~ 1 year ago-still has retinopathy uses glasses at night    Anemia, chronic disease  Not taking PNV iron or folate    History of simultaneous kidney and pancreas transplant (CMS-HCC)  Imuran Tacrolimus  ASA  Sodium bicarb  Sees transplant doctor every 6 months  Discussed importance of seeing transplant team    COVID-19 in immunocompromised patient (CMS-HCC)  Has gotten COVID vaccine, needs next booster    Previous cesarean delivery affecting pregnancy  Discussed delivery mode; desires repeat c section    We discussed that the pregnancy was unplanned and I offered the patient a termination which she declined as she wishes to continue. Discussed with patient the risk of pregnancy in the context of moderate to severe renal insufficiency (creatinine 1.5), including worsening renal function that may not be reversible, possible need for dialysis should function deteriorate, and the importance of normal blood pressure to protect kidney function. We discussed the risk for preeclampsia and the patient is on baby ASA. We discussed that preeclampsia can precipitate a preterm delivery-the patient previously delivered very early due to severe preeclampsia. We also discussed the risks for FGR, PTB and need for antenatal surveillance including monthly growth scans and NSTs.   Anticipatory guidance reviewed. Specifically we discussed  that on average, women should eat 300 extra calories per day, and reviewed appropriate  weight gaining guidelines based on the recent IOM report. We discussed how our MFM practice works, delivery at [facility] in [place]. Ordered prenatal labs including blood type, rubella, syphilis, hep B/C, ferritin/B12/folate, PAP, GC/CT. Discussed NIPS testing at 11-12 weeks; [place] ordered for 11-12 weeks and comprehensive scan at 19 weeks. Emphasized importance of continuing immunosuppressives and to follow up with her transplant team-message sent to Dr. Carlene Coria.  She will follow up in 4 weeks.      Number and complexity of problems addressed  Moderate: 2 or more stable chronic illnesses  Pancreas kidney transplant, anemia of chronic disease, moderate/severe renal insufficiency    Amount and/or Complexity of Data to be Reviewed and Analyzed  Review of prenatal records, notable for: labs, transplant records    Risk of Complications and/or Morbidity or Mortality of Patient Management  High: Drug therapy requiring intensive monitoring for toxicity: discussed use of imuran and tacrolimus, following levels for toxiity and sub therapeutic levels    MDM LOS: high     Lilly Cove, MD

## 2021-02-16 MED ORDER — PRENATAL VITAMINS WITH CALCIUM NO.72-IRON 29 MG-FOLIC ACID 1 MG TABLET
ORAL_TABLET | Freq: Every day | ORAL | 4 refills | 0 days | Status: CP
Start: 2021-02-16 — End: ?

## 2021-02-16 MED ORDER — METRONIDAZOLE 0.75 % TOPICAL CREAM
Freq: Two times a day (BID) | TOPICAL | 0 refills | 0 days | Status: CP
Start: 2021-02-16 — End: 2022-02-16
  Filled 2021-02-19: qty 45, 30d supply, fill #0

## 2021-02-19 DIAGNOSIS — Z940 Kidney transplant status: Principal | ICD-10-CM

## 2021-02-19 DIAGNOSIS — Z79899 Other long term (current) drug therapy: Principal | ICD-10-CM

## 2021-02-19 DIAGNOSIS — Z3200 Encounter for pregnancy test, result unknown: Principal | ICD-10-CM

## 2021-02-19 MED ORDER — SODIUM BICARBONATE 650 MG TABLET
ORAL_TABLET | Freq: Three times a day (TID) | ORAL | 3 refills | 30 days | Status: CP
Start: 2021-02-19 — End: 2022-02-19
  Filled 2021-02-22: qty 180, 30d supply, fill #0

## 2021-02-19 NOTE — Unmapped (Signed)
Called to check in with patient and to request labs to be drawn this week per Dr. Carlene Coria.  Pt requested orders be sent to [facility] and she will get labs this week.   Confirmed her appt with Dr. Thad Ranger later this month and her current medications.   Pt appreciated the call and provided number to call for needs and questions.

## 2021-02-19 NOTE — Unmapped (Signed)
[facility] Refill Coordination Note    Specialty Medication(s) to be Shipped:   Transplant: Envarsus 4mg  and Azathioprine 50mg     Other medication(s) to be shipped: aspirin 81mg , sodium bicarb (faxed for refills)     Elaine Koch, DOB: 1985/02/21  Phone: [phone] (home)       All above HIPAA information was verified with patient.     Was a Nurse, learning disability used for this call? No    Completed refill call assessment today to schedule patient's medication shipment from the [facility] [phone]).  All relevant notes have been reviewed.     Specialty medication(s) and dose(s) confirmed: Regimen is correct and unchanged.   Changes to medications: [name] reports no changes at this time.  Changes to insurance: No  New side effects reported not previously addressed with a pharmacist or physician: None reported  Questions for the pharmacist: No    Confirmed patient received a Conservation officer, historic buildings and a Surveyor, mining with first shipment. The patient will receive a drug information handout for each medication shipped and additional FDA Medication Guides as required.       DISEASE/MEDICATION-SPECIFIC INFORMATION        N/A    SPECIALTY MEDICATION ADHERENCE     Medication Adherence    Patient reported X missed doses in the last month: 0  Specialty Medication: Envarsus 4mg   Patient is on additional specialty medications: Yes  Additional Specialty Medications: Azathioprine 50mg   Patient is on more than two specialty medications: No  Informant: patient              Were doses missed due to medication being on hold? No    Envarsus 4 mg: 7 days of medicine on hand   Azathioprine 50 mg: 7 days of medicine on hand       REFERRAL TO PHARMACIST     Referral to the pharmacist: Not needed      [facility]     Shipping address confirmed in Epic.     Delivery Scheduled: Yes, Expected medication delivery date: 02/22/21.     Medication will be delivered via UPS to the prescription address in Epic WAM.    [name]   [facility] Specialty Technician

## 2021-02-22 MED FILL — ENVARSUS XR 4 MG TABLET,EXTENDED RELEASE: ORAL | 30 days supply | Qty: 120 | Fill #4

## 2021-02-22 MED FILL — ASPIRIN 81 MG TABLET,DELAYED RELEASE: ORAL | 30 days supply | Qty: 30 | Fill #9

## 2021-02-22 MED FILL — AZATHIOPRINE 50 MG TABLET: ORAL | 30 days supply | Qty: 60 | Fill #1

## 2021-02-23 LAB — CBC W/ DIFFERENTIAL
BANDED NEUTROPHILS ABSOLUTE COUNT: 0 10*3/uL (ref 0.0–0.1)
BASOPHILS ABSOLUTE COUNT: 0 10*3/uL (ref 0.0–0.2)
BASOPHILS RELATIVE PERCENT: 0 %
EOSINOPHILS ABSOLUTE COUNT: 0.3 10*3/uL (ref 0.0–0.4)
EOSINOPHILS RELATIVE PERCENT: 5 %
HEMATOCRIT: 22.3 % — ABNORMAL LOW (ref 34.0–46.6)
HEMOGLOBIN: 7.5 g/dL — ABNORMAL LOW (ref 11.1–15.9)
IMMATURE GRANULOCYTES: 0 %
LYMPHOCYTES ABSOLUTE COUNT: 0.7 10*3/uL (ref 0.7–3.1)
LYMPHOCYTES RELATIVE PERCENT: 13 %
MEAN CORPUSCULAR HEMOGLOBIN CONC: 33.6 g/dL (ref 31.5–35.7)
MEAN CORPUSCULAR HEMOGLOBIN: 32.6 pg (ref 26.6–33.0)
MEAN CORPUSCULAR VOLUME: 97 fL (ref 79–97)
MONOCYTES ABSOLUTE COUNT: 0.2 10*3/uL (ref 0.1–0.9)
MONOCYTES RELATIVE PERCENT: 4 %
NEUTROPHILS ABSOLUTE COUNT: 4 10*3/uL (ref 1.4–7.0)
NEUTROPHILS RELATIVE PERCENT: 78 %
PLATELET COUNT: 283 10*3/uL (ref 150–450)
RED BLOOD CELL COUNT: 2.3 x10E6/uL — CL (ref 3.77–5.28)
RED CELL DISTRIBUTION WIDTH: 13.2 % (ref 11.7–15.4)
WHITE BLOOD CELL COUNT: 5.2 10*3/uL (ref 3.4–10.8)

## 2021-02-23 LAB — BASIC METABOLIC PANEL
BLOOD UREA NITROGEN: 26 mg/dL — ABNORMAL HIGH (ref 6–20)
BUN / CREAT RATIO: 15 (ref 9–23)
CALCIUM: 9.5 mg/dL (ref 8.7–10.2)
CHLORIDE: 107 mmol/L — ABNORMAL HIGH (ref 96–106)
CO2: 17 mmol/L — ABNORMAL LOW (ref 20–29)
CREATININE: 1.73 mg/dL — ABNORMAL HIGH (ref 0.57–1.00)
EGFR: 40 mL/min/{1.73_m2} — ABNORMAL LOW
GLUCOSE: 87 mg/dL (ref 70–99)
POTASSIUM: 4.7 mmol/L (ref 3.5–5.2)
SODIUM: 139 mmol/L (ref 134–144)

## 2021-02-23 LAB — PHOSPHORUS: PHOSPHORUS, SERUM: 3.9 mg/dL (ref 3.0–4.3)

## 2021-02-23 LAB — MAGNESIUM: MAGNESIUM: 1.7 mg/dL (ref 1.6–2.3)

## 2021-02-24 LAB — URINALYSIS
BILIRUBIN UA: NEGATIVE
BLOOD UA: NEGATIVE
GLUCOSE UA: NEGATIVE
KETONES UA: NEGATIVE
NITRITE UA: NEGATIVE
PH UA: 7 (ref 5.0–7.5)
PROTEIN UA: NEGATIVE
SPECIFIC GRAVITY UA: 1.013 (ref 1.005–1.030)
UROBILINOGEN UA: 0.2 mg/dL (ref 0.2–1.0)

## 2021-02-24 LAB — MICROSCOPIC EXAMINATION
CASTS: NONE SEEN /LPF
RBC URINE: NONE SEEN /HPF (ref 0–2)

## 2021-02-26 LAB — TACROLIMUS LEVEL: TACROLIMUS BLOOD: 3.5 ng/mL (ref 2.0–20.0)

## 2021-02-26 NOTE — Unmapped (Signed)
Called patient to see how she was feeling due to her Cr being elevated from base line and no tac level recently.  Patient feels fine, no morning sickness.  She stated she has been having headaches that resolve with tylenol but she is trying to limit her tylenol intake.  She stated that she is eating and drinking fine as well.  Reviewed this with Dr. Carlene Coria.  Patient will have labs drawn on Wednesday as she is off work that day.  She was told that she needs a good tac trough and needs to leave a urine for a culture.  Patient has standing orders for [facility], no new orders placed today.

## 2021-03-01 DIAGNOSIS — Z940 Kidney transplant status: Principal | ICD-10-CM

## 2021-03-01 DIAGNOSIS — Z79899 Other long term (current) drug therapy: Principal | ICD-10-CM

## 2021-03-01 DIAGNOSIS — N189 Chronic kidney disease, unspecified: Principal | ICD-10-CM

## 2021-03-01 DIAGNOSIS — D631 Anemia in chronic kidney disease: Principal | ICD-10-CM

## 2021-03-01 LAB — PHOSPHORUS: PHOSPHORUS, SERUM: 3.5 mg/dL (ref 3.0–4.3)

## 2021-03-01 LAB — CBC W/ DIFFERENTIAL
BANDED NEUTROPHILS ABSOLUTE COUNT: 0 10*3/uL (ref 0.0–0.1)
BASOPHILS ABSOLUTE COUNT: 0 10*3/uL (ref 0.0–0.2)
BASOPHILS RELATIVE PERCENT: 0 %
EOSINOPHILS ABSOLUTE COUNT: 0.2 10*3/uL (ref 0.0–0.4)
EOSINOPHILS RELATIVE PERCENT: 5 %
HEMATOCRIT: 23 % — ABNORMAL LOW (ref 34.0–46.6)
HEMOGLOBIN: 7.5 g/dL — ABNORMAL LOW (ref 11.1–15.9)
IMMATURE GRANULOCYTES: 0 %
LYMPHOCYTES ABSOLUTE COUNT: 1 10*3/uL (ref 0.7–3.1)
LYMPHOCYTES RELATIVE PERCENT: 23 %
MEAN CORPUSCULAR HEMOGLOBIN CONC: 32.6 g/dL (ref 31.5–35.7)
MEAN CORPUSCULAR HEMOGLOBIN: 32.9 pg (ref 26.6–33.0)
MEAN CORPUSCULAR VOLUME: 101 fL — ABNORMAL HIGH (ref 79–97)
MONOCYTES ABSOLUTE COUNT: 0.2 10*3/uL (ref 0.1–0.9)
MONOCYTES RELATIVE PERCENT: 5 %
NEUTROPHILS ABSOLUTE COUNT: 3 10*3/uL (ref 1.4–7.0)
NEUTROPHILS RELATIVE PERCENT: 67 %
PLATELET COUNT: 345 10*3/uL (ref 150–450)
RED BLOOD CELL COUNT: 2.28 x10E6/uL — CL (ref 3.77–5.28)
RED CELL DISTRIBUTION WIDTH: 13.6 % (ref 11.7–15.4)
WHITE BLOOD CELL COUNT: 4.5 10*3/uL (ref 3.4–10.8)

## 2021-03-01 LAB — MAGNESIUM: MAGNESIUM: 1.8 mg/dL (ref 1.6–2.3)

## 2021-03-01 LAB — BASIC METABOLIC PANEL
BLOOD UREA NITROGEN: 17 mg/dL (ref 6–20)
BUN / CREAT RATIO: 10 (ref 9–23)
CALCIUM: 9 mg/dL (ref 8.7–10.2)
CHLORIDE: 110 mmol/L — ABNORMAL HIGH (ref 96–106)
CO2: 17 mmol/L — ABNORMAL LOW (ref 20–29)
CREATININE: 1.65 mg/dL — ABNORMAL HIGH (ref 0.57–1.00)
GLUCOSE: 92 mg/dL (ref 70–99)
POTASSIUM: 4.6 mmol/L (ref 3.5–5.2)
SODIUM: 140 mmol/L (ref 134–144)

## 2021-03-01 NOTE — Unmapped (Signed)
Addended by: Florian Buff on: 03/01/2021 09:45 AM     Modules accepted: Orders

## 2021-03-01 NOTE — Unmapped (Signed)
Called patient to schedule kidney biopsy for tomorrow for increasing creatinine.  Pt advised she can come tomorrow.  Pt advised to present to [facility] main hospital at 7am, hold morning ASA and NPO at midnight.  Pt has a driver that can stay with her tomorrow.

## 2021-03-01 NOTE — Unmapped (Signed)
[facility]  Kidney Transplant Biopsy Note    Date of Biopsy Referral: 03/01/21    Referring Transplant Provider: Lisbeth Ply  Pager: [phone]  Phone: [phone]    Kidney Transplant Coordinator: Celene Squibb    Biopsy Fellow  ____Eva Stein___________ at _______________________ otherwise contact referring provider.     (NAME)   (CONTACT - pager/cell phone #)    ---------------------------------------------------------------------------------------------------------------------  PLEASE INDICATED BIOPSY URGENCY:  EARLY MORNING READ      Patient Name: Elaine Koch  MR: [id number]  Age: 36 y.o.  Sex: Female Race: Black/African American Ethnicity: Not Hispanic, Latino/a, or Spanish origin   DOB:12/18/1985    Procedures: Ultrasound Guided Percutaneous Kidney Biopsy under Moderate Sedation  Tissue Submitted: Kidney  Special Studies Required: LM, IF, EM  ----------------------------------------------------------------------------------------------------------------------  Date of allograft implantation: 05/03/2019  ABO Incompatible: No  Underlying native kidney disease: Type 1 diabetes  Was the diagnosis established by biopsy? No  Previous transplant biopsies: No  If yes, what were the previous diagnoses?   Previous kidney transplants: No If yes, this is #:     Donor Information (if available)  Age of donor:   Gender:  Race:   Type of donor: Cadaveric simultaneous kidney/pancreas  Ischemia time:   Delayed graft function: No  If yes, how many days on dialysis:     History/Clinical Diagnosis/Indication for Biopsy: Patient is almost [redacted] weeks pregnant with creatinine that has been at the upper limit of her baseline, though given her pregnancy would expect her creatinine to be lower. Wanted to get biopsy before her pregnancy progresses and biopsy more risky. Has had DSAs post transplant as well, though not present on last screen 09/07/20. 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 She is now on azathioprine for pregnancy.    ----------------------------------------------------------------------------------------------------------------------  Current Baseline Immunosuppression: tacrolimus (Prograf or Envarsus) and azathioprine (please select ALL drugs used)  Specific anti-rejection treatment WITHIN 2 WEEKS before biopsy: No  If yes, what was the type of treatment?   Patient off immunosuppression?: No  Patient seems adherent to immunosuppression? Yes  Patient is currently back on dialysis? No  Has patient had any prior episodes of rejection? No   If yes, what was the treatment?   ----------------------------------------------------------------------------------------------------------------------  Donor Specific Antibody Results:  Lab Results   Component Value Date    Donor ID [id number] 09/07/2020    Donor HLA-A Antigen #1 A30 09/07/2020    Anti-Donor HLA-A #1 MFI 40 09/07/2020    Donor HLA-A Antigen #2 A68 09/07/2020    Donor HLA-B Antigen #1 B72 09/07/2020    Anti-Donor HLA-B #1 MFI 1 09/07/2020    Donor HLA-B Antigen #2 B42 09/07/2020    Anti-Donor HLA-B #2 MFI 62 09/07/2020    Donor HLA-C Antigen #1 C2 09/07/2020    Anti-Donor HLA-C #1 MFI 255 09/07/2020    Donor HLA-C Antigen #2 C17 09/07/2020    Anti-Donor HLA-C #2 MFI 856 09/07/2020    Donor HLA-DR Antigen #1 DR1 09/07/2020    Anti-Donor HLA-DR #1 MFI 206 09/07/2020    Donor HLA-DR Antigen #2 DR15 09/07/2020    Anti-Donor HLA-DR #2 MFI 140 09/07/2020    Donor HLA-DQB Antigen #1 DQ5 09/07/2020    Anti-Donor HLA-DQB #1 MFI 0 09/07/2020    Donor HLA-DQB Antigen #2 DQ6 09/07/2020    Anti-Donor HLA-DQB #2 MFI 131 09/07/2020    DSA Comment  09/07/2020      Comment:  The HLA antigens listed are donor antigens and the corresponding MFI value.  MFI values >= 1000 are considered positive.  A negative result does not exclude the presence of low level DSA.  Blank donor antigen and MFI fields indicate unavailable donor HLA   type or lack of appropriate single antigen bead to determine DSA.  As such, the presence or absence of DSA to the locus cannot be confirmed.     Donor specific antibody (DSA) testing is performed with a  solid phase multiplex single antigen bead array method.  All procedures and reagents have been validated and performance characteristics determined by the [facility] Laboratory.    Certain of these tests have not been cleared/approved by the U.S. Food and Drug Administration (FDA).  The FDA has determined that such approval/clearance is not necessary because this laboratory is certified under the Clinical Laboratory Improvements   Amendments to perform high complexity testing.  This test is used for clinical purposes.  It should not be regarded as investigational or for research.  All HLA typings are performed using molecular methodologies.  HLA-A, B, C, DR, and DQ results are   serological equivalents of the molecular type.         If Outside DSA Results:    Blood Pressure (mmHg):   BP Readings from Last 3 Encounters:   01/29/21 141/74   11/18/20 104/60   11/02/20 152/78     On Anti-hypertensive Therapy: Yes    Urinalysis:  Lab Results   Component Value Date    Color, UA Yellow 02/23/2021    Specific Gravity, UA 1.013 02/23/2021    pH, UA 7.0 02/23/2021    Glucose, UA Negative 02/23/2021    Ketones, UA Negative 02/23/2021    Blood, UA Negative 02/23/2021    Nitrite, UA Negative 02/23/2021    Leukocyte Esterase, UA 1+ (A) 02/23/2021    Urobilinogen, UA 0.2 02/23/2021    Bilirubin, UA Negative 02/23/2021    WBC, UA 0-5 02/23/2021    RBC, UA None seen 02/23/2021     Urine protein/creatinine ratio:   Lab Results   Component Value Date    Protein/Creatinine Ratio, Urine 0.303 09/07/2020       Creatinine (present peak): 1.73  Creatinine (baseline): 1.3-1.6  Lab Results   Component Value Date    CREATININE 1.65 (H) 02/28/2021    CREATININE 1.73 (H) 02/23/2021    CREATININE 1.53 (H) 01/29/2021    CREATININE 1.54 (H) 12/21/2020    CREATININE 1.43 (H) 11/18/2020       Glucose:   Lab Results   Component Value Date    Glucose 139 01/29/2021     HGBA1C:   Lab Results   Component Value Date    Hemoglobin A1C 5.0 09/07/2020     ----------------------------------------------------------------------------------------------------------------------  Clinical signs of infections at time of current biopsy:  BK: No   BK blood viral load:   Lab Results   Component Value Date    BK Blood Result Not Detected 05/12/2020      Outside BK Blood Viral Load:   Urine decoy cells present: No    CMV: No   CMV viral load:   Lab Results   Component Value Date    CMV Viral Ld Not Detected 09/07/2020    CMV Quant Negative 04/14/2020    CMV Quant Log10 2.00 (H) 12/10/2019      Outside CMV Blood Viral Load:     EBV:No   EBV viral load: No results found for:  EBVIU, EBVLOG    HSV: No  Hepatitis B: No  Hepatitis C: No  Bacteria: No  Fungi: No  Urinary tract infection: No  Other:   ----------------------------------------------------------------------------------------------------------------------  Stenosis of renal artery: No  Obstruction of ureter: No  Lymphocele: No

## 2021-03-02 ENCOUNTER — Encounter: Admit: 2021-03-02 | Discharge: 2021-03-03 | Payer: MEDICARE | Attending: Nephrology | Primary: Nephrology

## 2021-03-02 ENCOUNTER — Ambulatory Visit: Admit: 2021-03-02 | Discharge: 2021-03-03 | Payer: MEDICARE

## 2021-03-02 DIAGNOSIS — Z940 Kidney transplant status: Principal | ICD-10-CM

## 2021-03-02 DIAGNOSIS — N189 Chronic kidney disease, unspecified: Principal | ICD-10-CM

## 2021-03-02 DIAGNOSIS — D631 Anemia in chronic kidney disease: Principal | ICD-10-CM

## 2021-03-02 LAB — CBC W/ AUTO DIFF
BASOPHILS ABSOLUTE COUNT: 0 10*9/L (ref 0.0–0.1)
BASOPHILS RELATIVE PERCENT: 0.5 %
EOSINOPHILS ABSOLUTE COUNT: 0.2 10*9/L (ref 0.0–0.5)
EOSINOPHILS RELATIVE PERCENT: 3.1 %
HEMATOCRIT: 22 % — ABNORMAL LOW (ref 34.0–44.0)
HEMOGLOBIN: 7.6 g/dL — ABNORMAL LOW (ref 11.3–14.9)
LYMPHOCYTES ABSOLUTE COUNT: 0.7 10*9/L — ABNORMAL LOW (ref 1.1–3.6)
LYMPHOCYTES RELATIVE PERCENT: 13.9 %
MEAN CORPUSCULAR HEMOGLOBIN CONC: 34.5 g/dL (ref 32.0–36.0)
MEAN CORPUSCULAR HEMOGLOBIN: 33.7 pg — ABNORMAL HIGH (ref 25.9–32.4)
MEAN CORPUSCULAR VOLUME: 97.7 fL — ABNORMAL HIGH (ref 77.6–95.7)
MEAN PLATELET VOLUME: 7.8 fL (ref 6.8–10.7)
MONOCYTES ABSOLUTE COUNT: 0.3 10*9/L (ref 0.3–0.8)
MONOCYTES RELATIVE PERCENT: 5.5 %
NEUTROPHILS ABSOLUTE COUNT: 4.1 10*9/L (ref 1.8–7.8)
NEUTROPHILS RELATIVE PERCENT: 77 %
PLATELET COUNT: 356 10*9/L (ref 150–450)
RED BLOOD CELL COUNT: 2.25 10*12/L — ABNORMAL LOW (ref 3.95–5.13)
RED CELL DISTRIBUTION WIDTH: 14.6 % (ref 12.2–15.2)
WBC ADJUSTED: 5.4 10*9/L (ref 3.6–11.2)

## 2021-03-02 LAB — CBC
HEMATOCRIT: 20.6 % — ABNORMAL LOW (ref 34.0–44.0)
HEMOGLOBIN: 7 g/dL — ABNORMAL LOW (ref 11.3–14.9)
MEAN CORPUSCULAR HEMOGLOBIN CONC: 34.2 g/dL (ref 32.0–36.0)
MEAN CORPUSCULAR HEMOGLOBIN: 33.4 pg — ABNORMAL HIGH (ref 25.9–32.4)
MEAN CORPUSCULAR VOLUME: 97.6 fL — ABNORMAL HIGH (ref 77.6–95.7)
MEAN PLATELET VOLUME: 7.7 fL (ref 6.8–10.7)
PLATELET COUNT: 323 10*9/L (ref 150–450)
RED BLOOD CELL COUNT: 2.11 10*12/L — ABNORMAL LOW (ref 3.95–5.13)
RED CELL DISTRIBUTION WIDTH: 14.4 % (ref 12.2–15.2)
WBC ADJUSTED: 5 10*9/L (ref 3.6–11.2)

## 2021-03-02 LAB — BASIC METABOLIC PANEL
ANION GAP: 10 mmol/L (ref 5–14)
BLOOD UREA NITROGEN: 24 mg/dL — ABNORMAL HIGH (ref 9–23)
BUN / CREAT RATIO: 15
CALCIUM: 9.9 mg/dL (ref 8.7–10.4)
CHLORIDE: 113 mmol/L — ABNORMAL HIGH (ref 98–107)
CO2: 20 mmol/L (ref 20.0–31.0)
CREATININE: 1.64 mg/dL — ABNORMAL HIGH
EGFR CKD-EPI (2021) FEMALE: 42 mL/min/{1.73_m2} — ABNORMAL LOW (ref >=60)
GLUCOSE RANDOM: 94 mg/dL (ref 70–99)
POTASSIUM: 5.3 mmol/L — ABNORMAL HIGH (ref 3.4–4.8)
SODIUM: 143 mmol/L (ref 135–145)

## 2021-03-02 LAB — HAPTOGLOBIN: HAPTOGLOBIN: 94 mg/dL (ref 40–280)

## 2021-03-02 LAB — APTT
APTT: 32.5 s (ref 25.1–36.5)
HEPARIN CORRELATION: 0.2

## 2021-03-02 LAB — HEPATIC FUNCTION PANEL
ALBUMIN: 3.4 g/dL (ref 3.4–5.0)
ALKALINE PHOSPHATASE: 55 U/L (ref 46–116)
ALT (SGPT): 17 U/L (ref 10–49)
AST (SGOT): 21 U/L (ref <=34)
BILIRUBIN DIRECT: 0.2 mg/dL (ref 0.00–0.30)
BILIRUBIN TOTAL: 0.5 mg/dL (ref 0.3–1.2)
PROTEIN TOTAL: 6.9 g/dL (ref 5.7–8.2)

## 2021-03-02 LAB — LIPID PANEL
CHOLESTEROL/HDL RATIO SCREEN: 2.2 (ref 1.0–4.5)
CHOLESTEROL: 110 mg/dL (ref <=200)
HDL CHOLESTEROL: 50 mg/dL (ref 40–60)
LDL CHOLESTEROL CALCULATED: 46 mg/dL (ref 40–99)
NON-HDL CHOLESTEROL: 60 mg/dL — ABNORMAL LOW (ref 70–130)
TRIGLYCERIDES: 70 mg/dL (ref 0–150)
VLDL CHOLESTEROL CAL: 14 mg/dL (ref 8–32)

## 2021-03-02 LAB — IRON & TIBC
IRON SATURATION: 41 % (ref 20–55)
IRON: 106 ug/dL
TOTAL IRON BINDING CAPACITY: 257 ug/dL (ref 250–425)

## 2021-03-02 LAB — PROTIME-INR
INR: 0.99
PROTIME: 11.2 s (ref 9.8–12.8)

## 2021-03-02 LAB — TACROLIMUS LEVEL, TROUGH: TACROLIMUS, TROUGH: 3 ng/mL — ABNORMAL LOW (ref 5.0–15.0)

## 2021-03-02 LAB — VITAMIN B12: VITAMIN B-12: 492 pg/mL (ref 211–911)

## 2021-03-02 LAB — MAGNESIUM: MAGNESIUM: 1.6 mg/dL (ref 1.6–2.6)

## 2021-03-02 LAB — PHOSPHORUS: PHOSPHORUS: 3.7 mg/dL (ref 2.4–5.1)

## 2021-03-02 LAB — FERRITIN: FERRITIN: 574.2 ng/mL — ABNORMAL HIGH

## 2021-03-02 LAB — FOLATE: FOLATE: >24 ng/mL (ref >=5.4)

## 2021-03-02 LAB — TACROLIMUS LEVEL: TACROLIMUS BLOOD: 6.3 ng/mL (ref 2.0–20.0)

## 2021-03-02 MED ORDER — TACROLIMUS XR 4 MG TABLET,EXTENDED RELEASE 24 HR
ORAL_TABLET | Freq: Every day | ORAL | 11 refills | 30 days | Status: CP
Start: 2021-03-02 — End: 2022-03-02

## 2021-03-02 MED ORDER — TACROLIMUS XR 1 MG TABLET,EXTENDED RELEASE 24 HR
ORAL_TABLET | Freq: Every day | ORAL | 11 refills | 1.00000 days | Status: CP
Start: 2021-03-02 — End: 2022-03-02

## 2021-03-02 MED ADMIN — fentaNYL (PF) (SUBLIMAZE) injection: INTRAVENOUS | @ 16:00:00 | Stop: 2021-03-02

## 2021-03-02 MED ADMIN — midazolam (VERSED) injection: INTRAVENOUS | @ 16:00:00 | Stop: 2021-03-02

## 2021-03-02 NOTE — Unmapped (Signed)
Assessment/Plan:    Elaine Koch is a 36 y.o. female who will undergo Ultrasound-guided transplant kidney biopsy    1. Indications and risks/benefits of procedure reviewed with patient.    2. Consent signed and present on patient's charge.   3. No cardiopulmonary or other medical contraindications present therefore will proceed with biopsy.   4.  Surgical pathology has been consulted.  5. BUN is 24 today, so the patient will not receive DDAVP.      CC: [place] Biopsy Indication: Assess for Transplant Rejection    HPI: Elaine Koch is a 36 y.o. female who will undergo Ultrasound-guided transplant kidney biopsy with moderate sedation.    Allergies:  Iodinated contrast media, Nickel, Propranolol, Eye irrigating solution [ophthalmic irrigation solution], Iodine, Naltrexone, and Uni-cortrom    Medications:   Prior to Admission medications    Medication Sig Start Date End Date Taking? Authorizing Provider   aspirin (ECOTRIN) 81 MG tablet Take 1 tablet (81 mg total) by mouth daily. 05/17/20 05/17/21 Yes Posey Boyer True, MD   azaTHIOprine [facility]) 50 mg tablet Take 2 tablets (100 mg total) by mouth daily 01/22/21 01/22/22 Yes Posey Boyer True, MD   metroNIDAZOLE (METROCREAM) 0.75 % cream Apply topically Two (2) times a day. 02/16/21 02/16/22 Yes Rosalita Levan, MD   multivitamin, prenatal, folic acid-iron, 29 mg iron- 1 mg Take 1 tablet by mouth daily. 02/06/21  Yes Rosalita Levan, MD   sodium bicarbonate 650 mg tablet Take 2 tablets (1,300 mg total) by mouth Three (3) times a day. 02/19/21 02/19/22 Yes Posey Boyer True, MD   tacrolimus (ENVARSUS XR) 4 mg Tb24 extended release tablet Take 4 tablets (16 mg total) by mouth daily. 10/18/20 10/18/21 Yes Posey Boyer True, MD   nitrofurantoin, macrocrystal-monohydrate, (MACROBID) 100 MG capsule Take 1 capsule (100 mg total) by mouth Two (2) times a day for 7 days. 01/30/21 02/07/21  Jorene Minors, MD   PNV,calcium 72-iron,carb-folic 29 mg iron- 1 mg Tab Take 1 tablet by mouth daily.  Patient not taking: Reported on 03/02/2021 02/16/21   Rosalita Levan, MD       Medical History:  Past Medical History:   Diagnosis Date   ??? Chronic hypertension during pregnancy, antepartum 07/22/2015    Overview:  Methyldopa recommended per nephrologist if needed   ??? Diabetes mellitus type 1 (CMS-HCC)    ??? ESRD (end stage renal disease) (CMS-HCC)    ??? History of pre-eclampsia 10/24/2016   ??? 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 AV FISTULA PLACEMENT  2018   ??? CESAREAN SECTION     ??? PR COLONOSCOPY FLX DX W/COLLJ SPEC WHEN PFRMD N/A 02/14/2020    Procedure: COLONOSCOPY, FLEXIBLE, PROXIMAL TO SPLENIC FLEXURE; DIAGNOSTIC, W/WO COLLECTION SPECIMEN BY BRUSH OR WASH;  Surgeon: Carmon Ginsberg, MD;  Location: [facility];  Service: Gastroenterology   ??? PR COLONOSCOPY FLX DX W/COLLJ SPEC WHEN PFRMD N/A 06/19/2020    Procedure: COLONOSCOPY, FLEXIBLE, PROXIMAL TO SPLENIC FLEXURE; DIAGNOSTIC, W/WO COLLECTION SPECIMEN BY BRUSH OR WASH;  Surgeon: Carmon Ginsberg, MD;  Location: [facility];  Service: Gastroenterology   ??? PR FECAL MICROBIOTA PREP INSTIL N/A 02/14/2020    Procedure: PREP W INSTILLATION FECAL MICROBIOTA, ANY METHOD;  Surgeon: Carmon Ginsberg, MD;  Location: [facility];  Service: Gastroenterology   ??? PR LAP,DIAGNOSTIC ABDOMEN  N/A 11/17/2020    Procedure: DIAGNOSTIC AND OR OPERATIVE LAPAROSCOPY;  Surgeon: Estil Daft, MD;  Location: MAIN OR [facility];  Service: Womens Primary Gynecology   ??? PR PREPARE FECAL MICROBIOTA FOR INSTILLATION  06/19/2020    Procedure: PREP FECAL MICROBIOTA FOR INSTILLATION, INCLUDING ASSESSMENT OF DONOR SPECIMEN;  Surgeon: Carmon Ginsberg, MD;  Location: [facility];  Service: Gastroenterology   ??? PR SURG RX MISSED ABORTN,1ST TRI N/A 11/17/2020    Procedure: VACUUM ASPIRATION;  Surgeon: Estil Daft, MD; Location: MAIN OR [facility];  Service: [name] Primary Gynecology   ??? PR TRANSPLANT ALLOGRAFT PANCREAS N/A 05/03/2019    Procedure: TRANSPLANTATION OF PANCREATIC ALLOGRAFT;  Surgeon: Leona Carry, MD;  Location: [facility] OR [facility];  Service: Transplant   ??? PR TRANSPLANT,PREP CADAVER RENAL GRAFT N/A 05/03/2019    Procedure: [facility] STD PREP CAD DONR RENAL ALLOGFT PRIOR TO TRNSPLNT, INCL DISSEC/REM PERINEPH FAT, DIAPH/RTPER ATTAC;  Surgeon: Leona Carry, MD;  Location: MAIN OR [facility];  Service: Transplant   ??? PR TRANSPLANT,PREP DONOR PANCREAS N/A 05/03/2019    Procedure: [facility] STANDARD PREPARATION OF CADAVER DONOR PANCREAS ALLOGRAFT PRIOR TO TRANSPLANTATION;  Surgeon: Leona Carry, MD;  Location: MAIN OR [facility];  Service: Transplant   ??? PR TRANSPLANTATION OF KIDNEY N/A 05/03/2019    Procedure: RENAL ALLOTRANSPLANTATION, IMPLANTATION OF GRAFT; WITHOUT RECIPIENT NEPHRECTOMY;  Surgeon: Leona Carry, MD;  Location: MAIN OR [facility];  Service: Transplant   ??? TONSILLECTOMY         Social History:  Tobacco use:   reports that she has never smoked. She has never used smokeless tobacco.  Alcohol use:   reports no history of alcohol use.  Drug use:  reports no history of drug use.    Family History:  The patient's family history includes Cancer in her maternal grandmother; Diabetes in her father, maternal grandfather, mother, and paternal grandmother..    ROS:  10 systems reviewed and are negative unless otherwise mentioned in HPI    ASA Grade: ASA 2 - Patient with mild systemic disease with no functional limitations      PE:    Vitals:    03/02/21 0813   BP: 126/73   Pulse: 97   Resp: 18   Temp: 37.1 ??C (98.7 ??F)     General:  Well appeargin female in NAD.  Airway assessment: Class 3 - Can visualize soft palate  Cardiovascular:  Regular rate  Lungs: respirations nonlabored

## 2021-03-02 NOTE — Unmapped (Signed)
KIDNEY BIOPSY PROCEDURE NOTE    INDICATIONS:  Assess for rejection     CONSENT/TIME OUT:    Risks, benefits and alternatives including blood loss requiring transfusion, loss of kidney or kidney function, and death were discussed with patient.  Written informed consent was obtained prior to the procedure and is detailed in the medical record.  Prior to the start of the procedure, a time out was taken and the identity of the patient was confirmed via name, medical record number and date of birth.  The availability of the correct equipment was verified.    PROCEDURE:  Name: Transplant Kidney Biopsy  Description: Ultrasound guided transplant kidney biopsy     Pre-Procedure blood specimens were sent for CBC, PT/aPTT, and type & screen.     The patient was given 2 mg of midazolam and 50 mcg of fentanyl during the procedure, and 8 mL lidocaine 1% were used for local anesthesia. Under ultrasound guidance, a 16cm 16G biopsy needle was inserted into the left kidney for a total of 2 passes with 2 core specimens obtained for analysis.      COMPLICATIONS:  Complications:  Post-procedure ultrasound shows no hematoma.   Complications of the procedure: none     The patient will be monitored closely in the recovery area, kept flat for 4 hours while wearing an abdominal binder, and will have a repeat CBC and ultrasound in 4 hours to insure no hemodynamically significant bleeding post-biopsy.    SPECIMEN(S):  Core #1: 1.6 x 0.1 x 0.1 (LM 1.2, 0.2 EM, 0.3 IF)  Core #2: 1.0 x 0.1 x 0.1 (LM 1.0)

## 2021-03-03 NOTE — Unmapped (Signed)
Reviewed recent tac level 3.0 with Dr. Carlene Coria, pt advised to increase Envarsus dose to 18 mg daily.  Pt endorses she has some 1 mg tablets, new script send to [facility] pharmacy.  Pt verbalized understanding of medication dose change.

## 2021-03-05 DIAGNOSIS — Z940 Kidney transplant status: Principal | ICD-10-CM

## 2021-03-05 NOTE — Unmapped (Addendum)
[facility] Pharmacist has reviewed this new prescription.  Patient was counseled on this dosage change by CC- see epic note from 03/02/21.  Next refill call date adjusted if necessary.        Clinical Assessment Needed For: Dose Change  Medication: Envarsus XR 1mg  tablet  Last Fill Date/Day Supply: 09/14/2020 / 30 days  Copay $0  Was previous dose already scheduled to fill: No    Notes to Pharmacist: N/A      Clinical Assessment Needed For: Dose Change  Medication: Envarsus XR 4mg  tablet  Last Fill Date/Day Supply: 02/22/2021 / 30 days  Copay $0  Was previous dose already scheduled to fill: No    Notes to Pharmacist: N/A

## 2021-03-05 NOTE — Unmapped (Signed)
This encounter was created in error - please disregard.

## 2021-03-05 NOTE — Unmapped (Signed)
[facility] Clinical Assessment & Refill Coordination Note    Elaine Koch, Laurie: April 24, 1985  Phone: [phone] (home)     All above HIPAA information was verified with patient.     Was a Nurse, learning disability used for this call? No    Specialty Medication(s):   Transplant: Envarsus 1mg , Envarsus 4mg  and Azathioprine 50mg      Current Outpatient Medications   Medication Sig Dispense Refill   ??? aspirin (ECOTRIN) 81 MG tablet Take 1 tablet (81 mg total) by mouth daily. 30 tablet 11   ??? azaTHIOprine (IMURAN) 50 mg tablet Take 2 tablets (100 mg total) by mouth daily 60 tablet 11   ??? metroNIDAZOLE (METROCREAM) 0.75 % cream Apply topically Two (2) times a day. 45 g 0   ??? multivitamin, prenatal, folic acid-iron, 29 mg iron- 1 mg Take 1 tablet by mouth daily. 90 tablet 5   ??? PNV,calcium 72-iron,carb-folic 29 mg iron- 1 mg Tab Take 1 tablet by mouth daily. (Patient not taking: Reported on 03/02/2021) 100 tablet 4   ??? sodium bicarbonate 650 mg tablet Take 2 tablets (1,300 mg total) by mouth Three (3) times a day. 180 tablet 3   ??? tacrolimus (ENVARSUS XR) 1 mg Tb24 extended release tablet Take 2 tablets (2 mg total) by mouth in the morning. Take FOUR 4 mg tablets (16 mg total) and TWO 1 mg tablet (2 mg total) for a total dose of 18 mg daily. 60 tablet 11   ??? tacrolimus (ENVARSUS XR) 4 mg Tb24 extended release tablet Take 4 tablets (16 mg total) by mouth daily. Take FOUR 4 mg tablets (16 mg total) and TWO 1 mg tablet (2 mg total) for a total dose of 18 mg daily 120 tablet 11     No current facility-administered medications for this visit.        Changes to medications: Machelle reports no changes at this time.    Allergies   Allergen Reactions   ??? Iodinated Contrast Media Swelling, Rash and Other (See Comments)     Burning, warmth throughout body and tingling.  Throat swelling.  Treated with benadryl and symptoms improved.  Has not had contrast since then (2013 or 2014).   ??? Nickel Rash   ??? Propranolol Swelling   ??? Eye Irrigating Solution [Ophthalmic Irrigation Solution] Other (See Comments)     Contrast dye (name?) used in eyes caused hot feeling in face, reversed with Benadryl.    ??? Iodine      Other reaction(s): Skin Rash   ??? Naltrexone Rash   ??? Uni-Cortrom Rash       Changes to allergies: No    SPECIALTY MEDICATION ADHERENCE     Envarsus Xr 1 mg: 15 days of medicine on hand   Envarsus Xr 4 mg: 19 days of medicine on hand   Azathioprine 50 mg: 19 days of medicine on hand       Medication Adherence    Patient reported X missed doses in the last month: 0  Specialty Medication: Envarsus Xr 1mg   Patient is on additional specialty medications: Yes  Additional Specialty Medications: Envarsus Xr 4mg   Patient Reported Additional Medication X Missed Doses in the Last Month: 0  Patient is on more than two specialty medications: Yes  Specialty Medication: Azathioprine 50mg   Patient Reported Additional Medication X Missed Doses in the Last Month: 0          Specialty medication(s) dose(s) confirmed: Regimen is correct and unchanged.  Are there any concerns with adherence? No    Adherence counseling provided? Not needed    CLINICAL MANAGEMENT AND INTERVENTION      Clinical Benefit Assessment:    Do you feel the medicine is effective or helping your condition? Yes    Clinical Benefit counseling provided? Not needed    Adverse Effects Assessment:    Are you experiencing any side effects? No    Are you experiencing difficulty administering your medicine? No    Quality of Life Assessment:         How many days over the past month did your kidney/pancreas transplant  keep you from your normal activities? For example, brushing your teeth or getting up in the morning. 2    Have you discussed this with your provider? Not needed    Acute Infection Status:    Acute infections noted within Epic:  No active infections  Patient reported infection: None    Therapy Appropriateness:    Is therapy appropriate and patient progressing towards therapeutic goals? Yes, therapy is appropriate and should be continued    DISEASE/MEDICATION-SPECIFIC INFORMATION      N/A    PATIENT SPECIFIC NEEDS     - Does the patient have any physical, cognitive, or cultural barriers? No    - Is the patient high risk? No    - Does the patient require a Care Management Plan? No     SOCIAL DETERMINANTS OF HEALTH     At the [facility], we have learned that life circumstances - like trouble affording food, housing, utilities, or transportation can affect the health of many of our patients.   That is why we wanted to ask: are you currently experiencing any life circumstances that are negatively impacting your health and/or quality of life? No    Social Determinants of Health     Food Insecurity: Not on file   Tobacco Use: Low Risk    ??? Smoking Tobacco Use: Never   ??? Smokeless Tobacco Use: Never   ??? Passive Exposure: Not on file   Transportation Needs: Not on file   Alcohol Use: Not on file   Housing/Utilities: Not on file   Substance Use: Not on file   Financial Resource Strain: Not on file   Physical Activity: Not on file   Health Literacy: Not on file   Stress: Not on file   Intimate Partner Violence: Not on file   Depression: Not on file   Social Connections: Not on file       Would you be willing to receive help with any of the needs that you have identified today? Not applicable       SHIPPING     Specialty Medication(s) to be Shipped:   Transplant: None- Patient declined envarsus and azathioprine today.    Other medication(s) to be shipped: No additional medications requested for fill at this time     Changes to insurance: No    Delivery Scheduled: Patient declined refill at this time due to has at least 2 weeks on hand..     Medication will be delivered via UPS to the confirmed prescription address in [facility]    The patient will receive a drug information handout for each medication shipped and additional FDA Medication Guides as required.  Verified that patient has previously received a Conservation officer, historic buildings and a Surveyor, mining.    The patient or caregiver noted above participated in the development of this care plan  and knows that they can request review of or adjustments to the care plan at any time.      All of the patient's questions and concerns have been addressed.    Tera Helper   [facility] Specialty Pharmacist

## 2021-03-06 ENCOUNTER — Ambulatory Visit: Admit: 2021-03-06 | Discharge: 2021-03-07 | Payer: MEDICARE

## 2021-03-06 DIAGNOSIS — Z79899 Other long term (current) drug therapy: Principal | ICD-10-CM

## 2021-03-06 DIAGNOSIS — Z940 Kidney transplant status: Principal | ICD-10-CM

## 2021-03-06 DIAGNOSIS — E1029 Type 1 diabetes mellitus with other diabetic kidney complication: Principal | ICD-10-CM

## 2021-03-06 DIAGNOSIS — O09891 Supervision of other high risk pregnancies, first trimester: Principal | ICD-10-CM

## 2021-03-06 DIAGNOSIS — I158 Other secondary hypertension: Principal | ICD-10-CM

## 2021-03-06 DIAGNOSIS — D649 Anemia, unspecified: Principal | ICD-10-CM

## 2021-03-06 DIAGNOSIS — E1022 Type 1 diabetes mellitus with diabetic chronic kidney disease: Principal | ICD-10-CM

## 2021-03-06 DIAGNOSIS — Z9483 Pancreas transplant status: Principal | ICD-10-CM

## 2021-03-06 DIAGNOSIS — I428 Other cardiomyopathies: Principal | ICD-10-CM

## 2021-03-06 DIAGNOSIS — N185 Chronic kidney disease, stage 5: Principal | ICD-10-CM

## 2021-03-06 DIAGNOSIS — D573 Sickle-cell trait: Principal | ICD-10-CM

## 2021-03-06 LAB — CBC W/ AUTO DIFF
BASOPHILS ABSOLUTE COUNT: 0.1 10*9/L (ref 0.0–0.1)
BASOPHILS RELATIVE PERCENT: 1 %
EOSINOPHILS ABSOLUTE COUNT: 0.2 10*9/L (ref 0.0–0.5)
EOSINOPHILS RELATIVE PERCENT: 3.2 %
HEMATOCRIT: 25.3 % — ABNORMAL LOW (ref 34.0–44.0)
HEMOGLOBIN: 8.4 g/dL — ABNORMAL LOW (ref 11.3–14.9)
LYMPHOCYTES ABSOLUTE COUNT: 0.7 10*9/L — ABNORMAL LOW (ref 1.1–3.6)
LYMPHOCYTES RELATIVE PERCENT: 12.4 %
MEAN CORPUSCULAR HEMOGLOBIN CONC: 33.1 g/dL (ref 32.0–36.0)
MEAN CORPUSCULAR HEMOGLOBIN: 32 pg (ref 25.9–32.4)
MEAN CORPUSCULAR VOLUME: 96.7 fL — ABNORMAL HIGH (ref 77.6–95.7)
MEAN PLATELET VOLUME: 7.8 fL (ref 6.8–10.7)
MONOCYTES ABSOLUTE COUNT: 0.3 10*9/L (ref 0.3–0.8)
MONOCYTES RELATIVE PERCENT: 4.9 %
NEUTROPHILS ABSOLUTE COUNT: 4.3 10*9/L (ref 1.8–7.8)
NEUTROPHILS RELATIVE PERCENT: 78.5 %
PLATELET COUNT: 319 10*9/L (ref 150–450)
RED BLOOD CELL COUNT: 2.62 10*12/L — ABNORMAL LOW (ref 3.95–5.13)
RED CELL DISTRIBUTION WIDTH: 15 % (ref 12.2–15.2)
WBC ADJUSTED: 5.5 10*9/L (ref 3.6–11.2)

## 2021-03-06 LAB — COMPREHENSIVE METABOLIC PANEL
ALBUMIN: 3.4 g/dL (ref 3.4–5.0)
ALKALINE PHOSPHATASE: 45 U/L — ABNORMAL LOW (ref 46–116)
ALT (SGPT): 16 U/L (ref 10–49)
ANION GAP: 8 mmol/L (ref 5–14)
AST (SGOT): 21 U/L (ref <=34)
BILIRUBIN TOTAL: 0.6 mg/dL (ref 0.3–1.2)
BLOOD UREA NITROGEN: 15 mg/dL (ref 9–23)
BUN / CREAT RATIO: 11
CALCIUM: 9.6 mg/dL (ref 8.7–10.4)
CHLORIDE: 111 mmol/L — ABNORMAL HIGH (ref 98–107)
CO2: 24 mmol/L (ref 20.0–31.0)
CREATININE: 1.4 mg/dL — ABNORMAL HIGH
EGFR CKD-EPI (2021) FEMALE: 51 mL/min/{1.73_m2} — ABNORMAL LOW (ref >=60)
GLUCOSE RANDOM: 86 mg/dL (ref 70–99)
POTASSIUM: 4.6 mmol/L (ref 3.4–4.8)
PROTEIN TOTAL: 7 g/dL (ref 5.7–8.2)
SODIUM: 143 mmol/L (ref 135–145)

## 2021-03-06 LAB — HEMOGLOBIN A1C: HEMOGLOBIN A1C: <3.8 % — ABNORMAL LOW (ref 4.8–5.6)

## 2021-03-06 MED ORDER — MAGNESIUM OXIDE 400 MG (241.3 MG MAGNESIUM) TABLET
ORAL_TABLET | Freq: Every day | ORAL | 11 refills | 30 days | Status: CP
Start: 2021-03-06 — End: 2022-03-06

## 2021-03-06 MED ORDER — NIFEDIPINE ER 30 MG TABLET,EXTENDED RELEASE 24 HR
ORAL_TABLET | Freq: Every day | ORAL | 3 refills | 90.00000 days | Status: CP
Start: 2021-03-06 — End: 2022-03-06

## 2021-03-06 NOTE — Unmapped (Addendum)
-   Denies chest pain or shortness of breath.   - Denies nighttime wakenings. Only wakes up to pee.   - Last echo 09/2019: EF 55%

## 2021-03-06 NOTE — Unmapped (Addendum)
-   Crea slightly elevated ~1.6 (from baseline 1.3)   - s/p renal biopsy on 1/20 for creatinine at upper limit of normal for patient, path w/o signs of rejection  - Currently taking azathioprine & tacrolimus  - Nephrology appointment today  - Tacrolimus level & BMP today   - Reviewed that we anticipate possible worsening of kidney disease in pregnancy given CKD and rising creatinine at the beginning of pregnancy however that she will always have these complications in pregnancy and that it's reasonable to continue this pregnancy if desired.

## 2021-03-06 NOTE — Unmapped (Signed)
-   Desires genetics consult. Referral placed today.

## 2021-03-06 NOTE — Unmapped (Addendum)
-   Reports HA in the AM but no nighttime headaches. Denies association with AM intake. Will start Mag ox for ppx.   - [facility] referral today for sickle cell and T1DM.   - Ultrasound today   - ASA 81  - Recommend weekly testing beginning at 32 weeks.

## 2021-03-06 NOTE — Unmapped (Signed)
Maternal Fetal Medicine  RETURN OB VISIT    ASSESSMENT AND PLAN     Elaine Koch is a 36 y.o. [id number] at [redacted]w[redacted]d with EDD 09/16/2021, by Ultrasound 7w, who presents today for a return OB visit and is doing well.    Cardiomyopathy (CMS-HCC)  - Denies chest pain or shortness of breath.   - Denies nighttime wakenings. Only wakes up to pee.   - Last echo 09/2019: EF 55%    Other secondary hypertension  - BP Today stage 1 HTN. One MRBP during hospital visit for kidney bx.  - In discussion w/ Dr. Thad Ranger, plan to initiate Nifedipine 30 mg daily given occasional mild range blood pressure and bump in creatinine.  - Reviewed side effect profile of headaches that resolve after several weeks of use.     Type 1 diabetes mellitus (CMS-HCC)  - s/p renal & pancreas transplant  - no insulin since transplant   - Continues to follow with transplant team  - Tacrolimus level today     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  - Crea slightly elevated ~1.6 (from baseline 1.3)   - s/p renal biopsy on 1/20 for creatinine at upper limit of normal for patient, path w/o signs of rejection  - Currently taking azathioprine & tacrolimus  - Nephrology appointment today  - Tacrolimus level & BMP today   - Reviewed that we anticipate possible worsening of kidney disease in pregnancy given CKD and rising creatinine at the beginning of pregnancy however that she will always have these complications in pregnancy and that it's reasonable to continue this pregnancy if desired.     Supervision of other high risk pregnancies, first trimester  - Reports HA in the AM but no nighttime headaches. Denies association with AM intake. Will start Mag ox for ppx.   - [facility] referral today for sickle cell and T1DM.   - Ultrasound today   - ASA 81  - Recommend weekly testing beginning at 32 weeks.     Anemia  - Hgb 9.3 at new OB consistent with anemia of chronic disease given Ferritin 666. F/u Hgb 7 s/p 1u pRBCs   - B12, folate, Fe level, TIBC all WNL, No signs of hemolysis   -  Will discuss single dose preparations of Epo with Nephrology/hematology. Heme referral placed today.     Sickle cell trait (CMS-HCC)  - Desires genetics consult. Referral placed today.       RTC: Return in about 3 weeks (around 03/27/2021).    Dr. Robyne Peers was immediately available for the care of this patient.    SUBJECTIVE     CC: Return OB visit    HPI:   Elaine Koch presents today for a return OB visit. She denies vaginal bleeding, contractions, and leakage of fluid. She endorses normal fetal movement.    Denies SOB and chest pain.    Describes headaches that are sometimes in the back of the head. Has headache when she wakes up or starts around 12. Reports no nausea. Wakes up at 5 and doesn't eat until 10 am (snacks). Never wakes up from headaches. Denies changes in vision. No cold symptoms. Has humidifier but doesn't use it.     Patient's concerns today are see above.      OBJECTIVE     PHYSICAL EXAM:  LMP  (LMP Unknown)     General Appearance No acute distress, well appearing and well nourished.   Pulmonary Normal WOB  Cardiovascular Pulse normal rate.   Abdomen Abdomen soft, gravid, no fundal tenderness, no rebound or guarding.   Genitourinary Deferred.      Extremities No rash, lesions or petechiae. Edema none   Psychiatric Mood and affect within normal limits

## 2021-03-06 NOTE — Unmapped (Addendum)
-   Hgb 9.3 at new OB consistent with anemia of chronic disease given Ferritin 666. F/u Hgb 7 s/p 1u pRBCs   - B12, folate, Fe level, TIBC all WNL, No signs of hemolysis   -  Will discuss single dose preparations of Epo with Nephrology/hematology. Heme referral placed today.

## 2021-03-06 NOTE — Unmapped (Addendum)
-   s/p renal & pancreas transplant  - no insulin since transplant   - Continues to follow with transplant team  - Tacrolimus level today

## 2021-03-06 NOTE — Unmapped (Addendum)
-   BP Today stage 1 HTN. One MRBP during hospital visit for kidney bx.  - In discussion w/ Dr. Thad Ranger, plan to initiate Nifedipine 30 mg daily given occasional mild range blood pressure and bump in creatinine.  - Reviewed side effect profile of headaches that resolve after several weeks of use.

## 2021-03-06 NOTE — Unmapped (Signed)
Nephrology Clinic Visit      Background:  Elaine Koch is a 36 y.o. 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 Also had new DSA to DR51 at [facility] 1330 on 06/07/20. Her most recent baseline creatinine is between 1.3-1.6, occasionally up to 1.7.  She was on hemodialysis for about 3 years prior to transplant through an AVF. Her diabetes has been complicated by retinopathy. She had pre-eclampsia during pregnancy in 2017. She also had severe anemia requiring multiple blood transfusions during pregnancy and for some time after. In spite of that she had a cPRA of 0% prior to transplant.  She was admitted from 3/21 through 05/11/19 for the initial transplant. Her course was fairly uncomplicated. On POD 8 did have a transient bump in her creatinine (1.39 to 1.57) and amylase (76 to 112), came down to 1.52 and 89 with fluids. [place] of both kidney and pancreas showed patent vessels and normal RIs. After discharge, she had some sugars in the 120s. On 4/2 reported BP 88/58 so amlodipine was held. At surgery visit 4/8 reported loose stools, which she had pre-transplant. Reglan was decreased. BP 80s/40s so all antihypertensives were held. At visit 4/15 pressures remained low so midodrine 5 mg daily was started, reglan further decreased. Her amylase was 467 at that visit so prednisone 5 mg was started since tacrolimus was sub-therapeutic, with plans to taper off once tac stable and pancreatic enzymes normal. At nephrology visit 4/20, amylase down to 179 and tacrolimus level better on TID dosing. Ureteral stent removed 06/10/19. At visit 5/25 planned transition to Envarsus given high tacrolimus requirement. Amylase remained elevated but stable at 149. Blood pressure improved and was no longer hypotensive.   She called the on-call coordinator on 07/11/19 complaining of headache, nausea and vomiting, diarrhea the night prior. No fever. Was able to keep morning meds down. Recommended bland diet, push fluids. On 6/1 called to report BP 143/110 with chest pain, headache and vomiting and was referred to ED. She was admitted from 6/1 through 07/16/19. On admission she was noted to have a creatinine of 3.22 (from 1.19 on 5/28) in the setting of a tacrolimus level of 27.9. Tacrolimus was held and level came down. She was hypotensive and given IVF. She was found to be C.diff positive and started on vancomycin with plans for 10 day course. Creatinine to 1.32 on discharge. Wbc 0.8 on discharge with ANC 0.5. CMV negative on 6/1. Was discharged on Myfortic 720 mg BID.  On 07/20/19 she was noted to have a wbc < 0.6 and ANC < 0.2. She reported no symptoms and was instructed to hold Myfortic and Valcyte on 6/10 and received a dose of Granix. On 6/10 wbc to 0.6 with ANC 0.3. On 6/16 she reported diarrhea returned after stopping vancomycin, course was extended for an additional 14 days. Wbc improved to 3.9 on 6/14 and then 6.1 on 6/17 so Myfortic restarted at 180 mg BID. Creatinine to 1.56 on 6/17 and 1.49 on 6/21, reported had not been drinking as much since back at work. Wbc 8.6 on 6/21 with ANC 7.5. Most recent lipase has been 90-122.  She followed up with ID on 08/31/19, who placed a referral to Dr. Fara Boros with GI for possible FMT. Patient finished fidaxomicin on 09/06/19 and restarted Vancomycin. When this occurred, patient's diarrhea  returned. Patient's vancomycin stopped and she restarted Fidaxomicin on 09/13/19. She completed her Bezlotoxumab on 09/17/19.   She was admitted from 8/20 through 10/03/19 after presenting with LLQ pain, myalgias, headaches, and low grade fevers. Urine grew 50-100K Enterococcus, no other source found, CT abd/pelvis, TTE and renal US all unremarkable. Treated with 10 day course of linezolid.  She had a virtual visit with ID on 11/30/19 for diarrhea. 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 Per note She can continue the Vanco 125mg  4x daily until she is able to start fidaxomicin 200mg  bid for 10 days then decrease to 200mg  daily for secondary ppx.  Patient saw GI on 12/10/19 with a plan for fecal transplant 02/14/2020.  She followed up with ID on 12/24/19 who stated to Decrease Dificid tp 200mg  once daily for secondary ppx until Dec 31 (three days prior to FMT) and to Decrease Valcyte 900mg  qhs for secondary ppx until follow up in early Feb.   She underwent fecal transplant on 02/14/20. On 02/17/20 she called her coordinator and reported feeling poorly since the procedure, with cramping in her lower back, N/V, dysuria and frequency. Denied fever. She had been given cipro by her OB/GYN which we asked her not to take given recent fecal transplant, referred her to ED. She was admitted from 1/6 through 02/22/20. She had creatinine up to 2.34, down to 1.69 on discharge with IVF. Her tacrolimus level was up to 13.5 on 02/21/20, so tac dose was reduced. She grew proteus in her urine and blood, treated with IV cefipime and then transitioned to oral cipro through 02/28/20. She was also found to be COVID positive on admission and received remdesivir. Myfortic and Valcyte were held on discharge due to leukopenia, CMV was not detected.  Myfortic restarted at 180 mg BID on 03/03/20.   She had a telemedicine visit with [facility] on 03/10/20, patient reported recurrence of diarrhea similar to previous C.diff on 03/06/20. Restarted vancomycin BID which helped but did not resolve. Recommended increasing vanc to QID until she could get fidaxomicin filled, planned for 10 day course followed by vanc 1-2 times per day for maintenance. Also recommended 1g D-mannose after intercourse and 12 hours later to see if it helps with UTIs. Anticipated that she would require repeat FMT.  At her visit 03/23/20 she had not yet gotten the fidaxomicin and continued to have diarrhea. She followed up with [facility] 04/11/20 who encouraged her to fill the fidaxomicin as historically she had done better on it. Per the note I will continue to discuss risks/benefits of repeat FMT with Dr. Fara Boros. I wonder if FMT via colonoscopy is the best approach for her. While this is the most effective method, I wonder of the bowel cleanse sent her up for the urosepsis and if she would do better with another type of FMT administration. At visit 05/09/20 she was taking fidaxomicin and had follow up with GI on 05/12/20. At that visit they discussed repeat FMT via upper vs. lower delivery and Dr. Fara Boros thought lower delivery was still the best approach.  She underwent repeat FMT on 06/19/20. She tolerated that well. Had phone follow up with Dr. Fara Boros on 09/01/20 and reported some ongoing occasional loose stools. Recommended increased fruit and vegetable intake, bentyl as needed for abdominal cramps/loose stools.     TODAY:  Ms Clingan presents today for appt in the MFM-nephrology clinic.     She is currently [redacted]w[redacted]d.  Stopped MMF in Sept 2022.  Experienced  spontaneous abortion in Oct 2022, s/p D&C.      She underwent transplant kidney biopsy on 03/02/21 due to elevated Cr to 1.73 (baseline 1.3-1.5).  Preliminary biopsy without signs of rejection. Final report pending.     She was also found to be anemic at that time, Hgb down to 7.5 and 7.0 post biopsy.  She was transfused 1u pRBC's.  Denies vaginal bleeding or blood in stool.  She is taking ASA 81mg  daily.  Does endorse mild nose bleeds.     Overall, she is doing well today.  Reports feeling tired/fatigued and also having headaches regularly.  Headache can start in the back of her head and move forward, also near sinuses.  Has been taking tylenol+ caffeine.      Stools are normal per patient.     BP 138/74 today, no medications.  Reports similar at home.     Last dose of Envarsus: 9 am yesterday        Review of Systems    Otherwise on review of systems patient denies fever or chills, chest pain, SOB, PND or orthopnea, lower extremity edema. Denies N/V/abdominal pain. No dysuria, hematuria or difficulty voiding. Bowel movements without blood. Denies joint pain or rash. All other systems are reviewed and are negative.    Medications    Current Outpatient Medications   Medication Sig Dispense Refill   ??? aspirin (ECOTRIN) 81 MG tablet Take 1 tablet (81 mg total) by mouth daily. 30 tablet 11   ??? azaTHIOprine (IMURAN) 50 mg tablet Take 2 tablets (100 mg total) by mouth daily 60 tablet 11   ??? metroNIDAZOLE (METROCREAM) 0.75 % cream Apply topically Two (2) times a day. 45 g 0   ??? multivitamin, prenatal, folic acid-iron, 29 mg iron- 1 mg Take 1 tablet by mouth daily. 90 tablet 5   ??? sodium bicarbonate 650 mg tablet Take 2 tablets (1,300 mg total) by mouth Three (3) times a day. 180 tablet 3   ??? tacrolimus (ENVARSUS XR) 1 mg Tb24 extended release tablet Take 2 tablets (2 mg total) by mouth in the morning. Take FOUR 4 mg tablets (16 mg total) and TWO 1 mg tablet (2 mg total) for a total dose of 18 mg daily. 60 tablet 11   ??? tacrolimus (ENVARSUS XR) 4 mg Tb24 extended release tablet Take 4 tablets (16 mg total) by mouth daily. Take FOUR 4 mg tablets (16 mg total) and TWO 1 mg tablet (2 mg total) for a total dose of 18 mg daily 120 tablet 11   ??? PNV,calcium 72-iron,carb-folic 29 mg iron- 1 mg Tab Take 1 tablet by mouth daily. (Patient not taking: Reported on 03/02/2021) 100 tablet 4     No current facility-administered medications for this visit.       Physical Exam    LMP  (LMP Unknown)   General: Patient is a pleasant female in no apparent distress.  Eyes: Sclera anicteric.  ENT: Mask in place.  Neck: Supple without LAD/JVD/bruits.  Lungs: Clear to auscultation bilaterally, no wheezes/rales/rhonchi.  Cardiovascular: Regular rate and rhythm without murmurs, rubs or gallops.  Abdomen: Soft, notender/nondistended. Positive bowel sounds. No tenderness over the graft.  Extremities: Without edema, joints without evidence of synovitis.  Skin: Without rash.  Neurological: Grossly nonfocal.  Psychiatric: Mood and affect appropriate.    Laboratory Results    Results for orders placed or performed in visit on 03/06/21   POCT Urinalysis Dipstick   Result Value Ref Range  Spec Gravity/POC 1.015 1.003 - 1.030    PH/POC 7.0 5.0 - 9.0    Leuk Esterase/POC Negative Negative    Nitrite/POC Negative Negative    Protein/POC Negative Negative    UA Glucose/POC Negative Negative    Ketones, POC Negative Negative    Bilirubin/POC Negative Negative    Blood/POC Negative Negative    Urobilinogen/POC 1.0 0.2 - 1.0 mg/dL     *Note: Due to a large number of results and/or encounters for the requested time period, some results have not been displayed. A complete set of results can be found in Results Review.         Assessment and Plan    1. Status post renal transplant. Etiology 2/2 T1DM, s/p KP in 04/2019.    -- Recent Cr 1.64, baseline closer 1.3-1.5  -- S/p biopsy due to Cr of 1.7 -> prelim biopsy without rejection.  Follow-up final bx report  -- Labs today: CBC with diff, CMP, tac level, CMV  -- Envarsus goal of 8-10 (due to pancreas transplant), however may be contributing to her headaches. Will try to shoot for troughs closer to 8.  Currently on 18mg  daily   -- Cont azathioprine 100mg  daily   -- Cont sodium bicarb 1300mg  TID     2. Pregnancy, high risk: [redacted]w[redacted]d  -- Seeing [facility]  -- [place] today   -- Cont LDASA for preeclampsia prevention  -- Close BP, lab monitoring  -- BP goal < 135/85, discussed starting nifedipine 30mg  today with MFM  -- Serial growth [place] at viability, antenatal testing in 3rd trimester    3. Daily headaches: Unclear etiology, possibly tension headaches  -- Cont tylenol+ caffeine  -- MFM to start daily mag supplement    4. Anemia: Hgb 7.0 post biopsy, s/p pRBC.  Possibly related to immunosuppression+pregnancy, mild bleed post biopsy   -- Iron studies wnl, B12/folate wnl, no evidence of hemolysis  -- Check CBC today  -- Will consider ESA treatment at [facility] and possible hematology consult     5. Status post pancreas transplant. A1c 5.0 in July  -- Check A1c today     6. C.diff s/p repeat FMT 06/19/20. 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 Check CMV today     9. Hypertension. Had started amlodipine 5 mg daily, currently off.  Given higher BP's recently, will start nifedipine 30mg  daily.  -- Goal < 135/85, advised home monitoring     10. Health maintenance. Did not discuss today. Unclear if she has had flu shot this year?    Return in 1 month.    Malikai Gut L Nidya Bouyer    I personally spent 50 minutes face-to-face and non-face-to-face in the care of this patient, which includes all pre, intra, and post visit time on the date of service.  All documented time was specific to the E/M visit and does not include any procedures that may have been performed.

## 2021-03-07 LAB — TACROLIMUS LEVEL, TROUGH: TACROLIMUS, TROUGH: 3.7 ng/mL — ABNORMAL LOW (ref 5.0–15.0)

## 2021-03-08 LAB — CMV DNA, QUANTITATIVE, PCR: CMV VIRAL LD: NOT DETECTED

## 2021-03-08 NOTE — Unmapped (Signed)
Reviewed recent tacrolimus level 3.7 with Dr. Carlene Coria, pt advised to increase envarsus dose to 20 mg daily, script updated.  Pt verbalized understanding.  Pt also advised that someone will be reaching out from Hematology clinic to schedule an appt and to schedule an aranesp injection for low hbg levels.

## 2021-03-08 NOTE — Unmapped (Signed)
Called patient to schedule nurse visit for Aranesp, unable to leave a message voicemail is not setup. Sent text message.

## 2021-03-09 NOTE — Unmapped (Signed)
per Coleen inbasket on 03/08/21- called pt. to schedule nurse visit for an Aranesp injection. Patient states she will call back on a later date to schedule, whenever she has sheduled her OBGYN appt. to come on that same day. Recall in system.

## 2021-03-12 NOTE — Unmapped (Signed)
Pt called nurse line stating that she would like to speak with Dr Robyne Peers, wants to proceed with D&C.  Pt states that she sent a MyChart message last week, but has not received a response back from provider.  Informed pt will route message to Dr Robyne Peers.  Pt verbalized understanding.

## 2021-03-13 NOTE — Unmapped (Signed)
I called patient to discuss termination of pregnancy. We reviewed the (normal) results of her cfDNA. She states that she would want to terminate her pregnancy because of the health risks to her of continuing with renal disease, as well as based on her experience in her last pregnancy (when she delivered @ 27 weeks). I will refer her to the [facility] service.    Jamal Collin, MD

## 2021-03-15 DIAGNOSIS — Z940 Kidney transplant status: Principal | ICD-10-CM

## 2021-03-15 DIAGNOSIS — Z332 Encounter for elective termination of pregnancy: Principal | ICD-10-CM

## 2021-03-15 NOTE — Unmapped (Signed)
Called patient to review the plan for her termination of pregnancy. She would like to proceed with OR procedure. She has limitations in ability to get to [facility] would like telemedicine visit on Monday 2/13. Reviewed her gestational age and cervical prep with misoprostol. Will discuss optimization of renal function with renal team. Reviewed plan with Dr. Cheryll Cockayne.

## 2021-03-16 NOTE — Unmapped (Signed)
Provided patient the Required Notice Provisions Pursuant to [facility]. Gen. Stat. ?? 90-21.82(1)-(2) and [facility] policy.  Efrain Sella, RN

## 2021-03-19 ENCOUNTER — Telehealth: Admit: 2021-03-19 | Payer: MEDICARE | Attending: Obstetrics & Gynecology | Primary: Obstetrics & Gynecology

## 2021-03-19 DIAGNOSIS — Z940 Kidney transplant status: Principal | ICD-10-CM

## 2021-03-19 LAB — HLA DS POST TRANSPLANT
ANTI-DONOR DRW #2 MFI: 303 MFI
ANTI-DONOR HLA-A #1 MFI: 53 MFI
ANTI-DONOR HLA-A #2 MFI: 0 MFI
ANTI-DONOR HLA-B #1 MFI: 0 MFI
ANTI-DONOR HLA-B #2 MFI: 19 MFI
ANTI-DONOR HLA-C #2 MFI: 2268 MFI — ABNORMAL HIGH
ANTI-DONOR HLA-DQB #1 MFI: 0 MFI
ANTI-DONOR HLA-DQB #2 MFI: 25 MFI
ANTI-DONOR HLA-DR #1 MFI: 84 MFI
ANTI-DONOR HLA-DR #2 MFI: 30 MFI

## 2021-03-19 LAB — FSAB CLASS 1 ANTIBODY SPECIFICITY: HLA CLASS 1 ANTIBODY RESULT: POSITIVE

## 2021-03-19 LAB — FSAB CLASS 2 ANTIBODY SPECIFICITY
CLASS 2 ANTIBODIES IDENTIFIED: 1:1 {titer}
HLA CL2 AB RESULT: POSITIVE

## 2021-03-19 NOTE — Unmapped (Addendum)
[facility]         How to contact us:  During Office Hours (8am - 4pm): [phone]  All other times, including holidays, weekends, nights and early mornings call the [facility] Operator at [phone] and ask for the ???GYN Consult Pager???    Call us immediately if you have:    FEVER: If your temperature is 100.4?F or higher or if you have shaking chills.     HEAVY BLEEDING: If you have bleeding that soaks through two or more maxi pads in one hour or less; pass large clots; or feel faint, dizzy or pass out.    SEVERE CRAMPING: If you have severe cramping or abdominal pain not helped by the Ibuprofen     DISCHARGE OR FLUID LEAKING: If you have foul smelling vaginal discharge or fluid different from bleeding.    DO NOT EAT OR DRINK AFTER MIDNIGHT.  This is very, very important. To do the surgery safely you must have an empty stomach for 8 hours or more. If you eat or drink, you will need to wait until 8 hours from when you ate or drank to have your surgery. If you have cramps overnight and want to take pain pills, you can take them with a small sip of water. If you are on other medications, check with [place] about taking them.    __0630__: Elaine Koch at [facility]  Make a plan for how you will get to your surgery on time. Please make sure someone escorts you on the day of surgery and they are available to wait and drive you home after the procedure. You will not be able to have the operation if you do not have a driver with you. If you need help with transportation, please tell us soon so we can help.  Do not wear contact lenses, jewelry (including piercings) or eye make up when you come this day.    What to expect after your procedure:  You are not safe to drive, operate machinery, activities that require balance, or involve risk of fall, sign legal paperwork, or make major decisions for 24 hours after surgery. Effects of anesthesia may last for several hours; supervision and assistance at home is recommended.   You should be ready to return to work or school 1-2 days after the procedure. Please ask [place] if you need a note from a doctor  During the next two weeks, get adequate rest, drink fluids and undertake heavy lifting or strenuous activity only once you are ready  You will be given antibiotics (1 tablet of azithromycin) before your procedure. This antibiotic helps prevent an infection after your procedure.    Bleeding or spotting for 2 to 4 weeks is normal. Use maxi pads so you can see your bleeding. Some women experience no bleeding, which is also normal. If you are bleeding heavily (soaking more than 1 pad an hour for more than 2 hours) or passing large clots, call us immediately. If you are still having bleeding (more than just spotting) two weeks after the procedure, please call us because you need to be seen and examined by one of our doctors.    Pain and cramping is normal for the first 1-2 days after the procedure. You may use ibuprofen, Tylenol or a heating pad to alleviate discomfort.    If you feel hot or sick, check your temperature. If your temperature is 100.22F or more or you feel sick, please call us immediately.  Do not place anything in your vagina for two weeks. Your cervix may be open for two weeks. No intercourse (sex), or tampons.    Breast engorgement or tenderness. If your breasts are painful and /or you notice secretion, bind them tightly with a snug bra, a folded towel secured with safety pins, or a 7??? ace bandage. Ice may be used for 10 minutes every half hour. Stimulation, such as hot water from a shower should be avoided.    Sadness. Please call us if you are having difficulty with sadness. We can refer you to further assistance. Some women find the toll-free hotline helpful: 1-866-4-EXHALE (838)857-1135).     You can expect your first period in 2-6 weeks if you are not using any birth control method.    If you are starting birth control you can expect the following:  LNG Intrauterine Contraception (Mirena?? or Liletta??) placed after your procedure: Expect irregular bleeding and spotting for up to six months, followed by minimal or no monthly bleeding.  Copper Intrauterine Contraception (ParaGard) placed after your procedure: Expect bleeding and spotting for 2 to 6 weeks from the procedure, then you can expect monthly periods.  Birth Control Pill, Patch, Vaginal Ring: You can start immediately.  You can expect spotting and bleeding for 2-6 weeks from the procedure, followed by monthly menses during the placebo week (hormone-free pills or no patch / ring in place).  Depo-Provera ?? injection immediately after the procedure: Expect bleeding and spotting for 2-6 weeks after the procedure, followed by up to 6 months of irregular spotting, followed by decreased monthly bleeding.  Remember to follow-up with your physician or clinic for a repeat Depo-Provera?? injection every 3 months.  Etonogestrel implant (Nexplanon ??):  Expect irregular bleeding or spotting. If the bleeding becomes heavy or bothersome, make a follow-up appointment with [place] or your local doctor.  Other:      [facility]  [address]  [place], [place] [address]    Directions  from  the  [facility])  Take [address] [place] to [address] for [place].  Turn LEFT onto [address] ([address]  Go to the 4th light and turn LEFT onto [address].  The building and parking lot will be on your RIGHT.    Directions  from  the  [place]  [facility])  Take [address] [place] toward [place].  Continue onto [address] toward [employer].  Turn LEFT onto [address] toward [place] to [address] for [place].  Bear RIGHT off the exit onto [address]).  At the first light turn RIGHT onto [address].  The building and parking lot will be on your RIGHT.    Directions  from  the  [facility])  Take [address] [place] toward [place] to [address] for [place].  Bear RIGHT off the exit onto [address]  At the first light turn RIGHT onto [address].  The building and parking lot will be on your RIGHT.    Directions  from  the  [facility])  Take [address]/ [address] toward [place].  At the I-40/I-85 split stay to the RIGHT onto [address] [place] toward [place].  Take Exit 261 for [facility].  Turn LEFT off the exit onto [address] [facility]).  At the first light turn RIGHT onto [address].  The building and parking lot will be on your RIGHT.    The hospital  building is immediately to the right of the clinic building.  At the hospital building, please check in at Registration.  After registering, take the elevator to the 2nd floor and follow signs to the surgery waiting room and check in at the front desk.

## 2021-03-19 NOTE — Unmapped (Signed)
FAMILY PLANNING CLINIC NOTE    03/19/21    ASSESSMENT/PLAN:  Patient Elaine Koch is a 36 y.o. G3P0111 at 14 weeks of gestation by No LMP recorded (lmp unknown). Patient is pregnant. presenting for electric vacuum aspiration for induced abortion [phone]) for unplanned pregnancy (V61.7) and maternal health condition.     >  Legal induced abortion maternal health condition  Indications for abortion: maternal health condition  Bereavement Plan:  none  Surgical plan: electric vacuum aspiration for induced abortion [phone])     Consent was reviewed with the patient on 2/3 on initial coordination of abortion by myself, Blima Rich MD.     We discussed in detail the surgical plan including risks of infection, bleeding, damage to surrounding structures, uterine perforation, need for additional procedures including blood transfusion, laparoscopy, laparotomy or hysterectomy. She does accept blood products. She received state-mandated counseling 72 hours in advance and has signed the [facility] Abortion Certificate. We reviewed the surgical consent. She will accept blood products in the event of an emergency.    Needs abortion consent signed on day of surgery.   Needs misoprostol vaginally on presentation to pre op.   Needs azithromycin in pre op.     Patient had CKD s/p renal and pancreas transplant. Discussed with nephrologist patient's upcoming surgery. Recommend at least 500cc of IV fluids for resuscitation un case.     Hgb: 9   Blood type: O POS   Rhogam was administered: no         > Contraception Counseling:  We reviewed the risks and benefits of all available contraceptive methods. Specifically we reviewed hormonal, barrier, and devices.    The pill requires daily self administration and works by preventing ovulation. Menses can become lighter and more predictable with the pill and sometimes the pill helps to prevent and manage acne. The pill has to be taken perfectly every day.  The injection (depo-medroxyprogesterone acetate) is administered by a nurse every three months in the clinic so this requires return visits. The DMPA injection is safe to use for many years. Particularly we discussed the progestin releasing IUD and the copper IUD.  We reviewed that they are both almost 100% effective, although there is always a less than one in one hundred chance of pregnancy so if she ever thought she were pregnant she should check a pregnancy test. The progestin IUDs last up to 7 years, are associated with irregular unpredictable bleeding for the first 3-6 months of use but after that most women have light to zero menses. The copper IUD lasts up to 12 years, and the first three months of use can be associated with heavier menses and sometimes more painful menses. These changes do not result in a change in iron level. We also discussed the Nexplanon, which is close to 100% effective and is also associated with light but irregular bleeding.  Patient has decided for: none      The patient reports they are currently: at home. I spent 20 minutes on the phone with the patient on the date of service. I spent an additional 10 minutes on pre- and post-visit activities on the date of service.     The patient was physically located in [place] or a state in which I am permitted to provide care. The patient and/or parent/guardian understood that s/he may incur co-pays and cost sharing, and agreed to the telemedicine visit. The visit was reasonable and appropriate under the circumstances given the patient's presentation  at the time.    The patient and/or parent/guardian has been advised of the potential risks and limitations of this mode of treatment (including, but not limited to, the absence of in-person examination) and has agreed to be treated using telemedicine. The patient's/patient's family's questions regarding telemedicine have been answered.     If the visit was completed in an ambulatory setting, the patient and/or parent/guardian has also been advised to contact their provider???s office for worsening conditions, and seek emergency medical treatment and/or call 911 if the patient deems either necessary.      Discussed with Dr. Meriel Pica, MD  Complex Family Planning  Date: 03/19/2021  Time: 1:02 PM   ___________________________________________________________________    Subjective   HPI: Elaine Koch is a 36 y.o. female G3P0111 at 14wks for pre op for abortion. She was referred by MFM at [facility] for worsening kidney function. Today, she reports  no vaginal bleeding or discharge, abdominal pain/cramping, changes in urinary or bowel habits.     RELEVANT GYN HISTORY:  Prior Gyn operations: D&C  Denies abnormal uterine bleeding  Denies history of abnormal pap, last pap normal per patient  Denies history of STI/PID, cysts, endometriosis, fibroids    OB HISTORY:   OB History   Gravida Para Term Preterm AB Living   3 1 0 1 1 1    SAB IAB Ectopic Molar Multiple Live Births   0 0 0 0 0 1      # Outcome Date GA Lbr Len/2nd Weight Sex Delivery Anes PTL Lv   3 Current            2 AB 11/02/20 [redacted]w[redacted]d          1 Preterm 2017 [redacted]w[redacted]d    CS-Unspec   LIV     PAST MEDICAL HISTORY:   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     PAST SURGICAL HISTORY:   Past Surgical History:   Procedure Laterality Date    AV FISTULA PLACEMENT  2018    CESAREAN SECTION      PR COLONOSCOPY FLX DX W/COLLJ SPEC WHEN PFRMD N/A 02/14/2020    Procedure: COLONOSCOPY, FLEXIBLE, PROXIMAL TO SPLENIC FLEXURE; DIAGNOSTIC, W/WO COLLECTION SPECIMEN BY BRUSH OR WASH;  Surgeon: Carmon Ginsberg, MD;  Location: [facility]URES [facility];  Service: Gastroenterology    PR COLONOSCOPY FLX DX W/COLLJ SPEC WHEN PFRMD N/A 06/19/2020    Procedure: COLONOSCOPY, FLEXIBLE, PROXIMAL TO SPLENIC FLEXURE; DIAGNOSTIC, W/WO COLLECTION SPECIMEN BY BRUSH OR WASH;  Surgeon: Carmon Ginsberg, MD;  Location: GI PROCEDURES MEMOR[facility];  Service: Gastroenterology    PR FECAL MICROBIOTA PREP INSTIL N/A 02/14/2020    Procedure: PREP W INSTILLATION FECAL MICROBIOTA, ANY METHOD;  Surgeon: Carmon Ginsberg, MD;  Location: GI [facility];  Service: Gastroenterology    PR LAP,DIAGNOSTIC ABDOMEN N/A 11/17/2020    Procedure: DIAGNOSTIC AND OR OPERATIVE LAPAROSCOPY;  Surgeon: Estil Daft, MD;  Location: MAIN OR [facility];  Service: Womens Primary Gynecology    PR PREPARE FECAL MICROBIOTA FOR INSTILLATION  06/19/2020    Procedure: PREP FECAL MICROBIOTA FOR INSTILLATION, INCLUDING ASSESSMENT OF  DONOR SPECIMEN;  Surgeon: Carmon Ginsberg, MD;  Location: [facility];  Service: Gastroenterology    PR SURG RX MISSED ABORTN,1ST TRI N/A 11/17/2020    Procedure: VACUUM ASPIRATION;  Surgeon: Estil Daft, MD;  Location: MAIN OR [facility];  Service: [facility] Primary Gynecology    PR TRANSPLANT ALLOGRAFT PANCREAS N/A 05/03/2019    Procedure: TRANSPLANTATION OF PANCREATIC ALLOGRAFT;  Surgeon: Leona Carry, MD;  Location: MAIN OR [facility];  Service: Transplant    PR TRANSPLANT,PREP CADAVER RENAL GRAFT N/A 05/03/2019    Procedure: [facility] STD PREP CAD DONR RENAL ALLOGFT PRIOR TO TRNSPLNT, INCL DISSEC/REM PERINEPH FAT, DIAPH/RTPER ATTAC;  Surgeon: Leona Carry, MD;  Location: MAIN OR [facility];  Service: Transplant    PR TRANSPLANT,PREP DONOR PANCREAS N/A 05/03/2019    Procedure: BACKBENCH STANDARD PREPARATION OF CADAVER DONOR PANCREAS ALLOGRAFT PRIOR TO TRANSPLANTATION;  Surgeon: Leona Carry, MD;  Location: MAIN OR [facility];  Service: Transplant    PR TRANSPLANTATION OF KIDNEY N/A 05/03/2019    Procedure: RENAL ALLOTRANSPLANTATION, IMPLANTATION OF GRAFT; WITHOUT RECIPIENT NEPHRECTOMY;  Surgeon: Leona Carry, MD;  Location: MAIN OR [facility];  Service: Transplant    TONSILLECTOMY       MEDICATIONS:   Current Outpatient Medications   Medication Sig Dispense Refill    aspirin (ECOTRIN) 81 MG tablet Take 1 tablet (81 mg total) by mouth daily. 30 tablet 11    azaTHIOprine (IMURAN) 50 mg tablet Take 2 tablets (100 mg total) by mouth daily 60 tablet 11    magnesium oxide (MAG-OX) 400 mg (241.3 mg elemental magnesium) tablet Take 1 tablet (400 mg total) by mouth daily. 30 tablet 11    metroNIDAZOLE (METROCREAM) 0.75 % cream Apply topically Two (2) times a day. 45 g 0    multivitamin, prenatal, folic acid-iron, 29 mg iron- 1 mg Take 1 tablet by mouth daily. 90 tablet 5    NIFEdipine (PROCARDIA XL) 30 MG 24 hr tablet Take 1 tablet (30 mg total) by mouth daily. 90 tablet 3    PNV,calcium 72-iron,carb-folic 29 mg iron- 1 mg Tab Take 1 tablet by mouth daily. (Patient not taking: Reported on 03/02/2021) 100 tablet 4    sodium bicarbonate 650 mg tablet Take 2 tablets (1,300 mg total) by mouth Three (3) times a day. 180 tablet 3    tacrolimus (ENVARSUS XR) 1 mg Tb24 extended release tablet Take 2 tablets (2 mg total) by mouth in the morning. Take FOUR 4 mg tablets (16 mg total) and TWO 1 mg tablet (2 mg total) for a total dose of 18 mg daily. 60 tablet 11    tacrolimus (ENVARSUS XR) 4 mg Tb24 extended release tablet Take 4 tablets (16 mg total) by mouth daily. Take FOUR 4 mg tablets (16 mg total) and TWO 1 mg tablet (2 mg total) for a total dose of 18 mg daily 120 tablet 11     No current facility-administered medications for this visit.     ALLERGIES: Iodinated contrast media, Nickel, Propranolol, Eye irrigating solution [ophthalmic irrigation solution], Iodine, Naltrexone, and Uni-cortrom      LABS:  Lab Results   Component Value Date    WBC 5.5 03/06/2021    HGB 8.4 (L) 03/06/2021    HCT 25.3 (L) 03/06/2021    PLT 319 03/06/2021 Lab Results   Component Value Date    NA 143 03/06/2021    K 4.6 03/06/2021    CL 111 (H) 03/06/2021    CO2 24.0 03/06/2021  BUN 15 03/06/2021    CREATININE 1.40 (H) 03/06/2021    GLU 86 03/06/2021    CALCIUM 9.6 03/06/2021    MG 1.6 03/02/2021    PHOS 3.7 03/02/2021       Lab Results   Component Value Date    BILITOT 0.6 03/06/2021    BILIDIR 0.20 03/02/2021    PROT 7.0 03/06/2021    ALBUMIN 3.4 03/06/2021    ALT 16 03/06/2021    AST 21 03/06/2021    ALKPHOS 45 (L) 03/06/2021    GGT 18 05/02/2019       Lab Results   Component Value Date    PT 11.2 03/02/2021    INR 0.99 03/02/2021    APTT 32.5 03/02/2021     O POS    IMAGING:  Outside records: reviewed no PAS     This was a telehealth service where a fellow was involved. I was immediately available via telephone/pager.

## 2021-03-20 ENCOUNTER — Encounter: Admit: 2021-03-20 | Discharge: 2021-03-20 | Payer: MEDICARE

## 2021-03-20 ENCOUNTER — Ambulatory Visit: Admit: 2021-03-20 | Discharge: 2021-03-20 | Payer: MEDICARE

## 2021-03-20 MED ADMIN — fentaNYL (PF) (SUBLIMAZE) injection: INTRAVENOUS | @ 15:00:00 | Stop: 2021-03-20

## 2021-03-20 MED ADMIN — ondansetron (ZOFRAN) injection: INTRAVENOUS | @ 15:00:00 | Stop: 2021-03-20

## 2021-03-20 MED ADMIN — midazolam (VERSED) injection: INTRAVENOUS | @ 15:00:00 | Stop: 2021-03-20

## 2021-03-20 MED ADMIN — miSOPROStoL (CYTOTEC) tablet 600 mcg: 600 ug | VAGINAL | @ 13:00:00 | Stop: 2021-03-20

## 2021-03-20 MED ADMIN — azithromycin (ZITHROMAX) tablet 500 mg: 500 mg | ORAL | @ 13:00:00 | Stop: 2021-03-20

## 2021-03-20 MED ADMIN — lidocaine (XYLOCAINE) 20 mg/mL (2 %) injection: INTRAVENOUS | @ 15:00:00 | Stop: 2021-03-20

## 2021-03-20 MED ADMIN — propofoL (DIPRIVAN) injection: INTRAVENOUS | @ 15:00:00 | Stop: 2021-03-20

## 2021-03-20 MED ADMIN — lidocaine (PF) (XYLOCAINE-MPF) 5 mg/mL (0.5 %) 20 mL, vasopressin (VASOSTRICT) 4 Units: @ 15:00:00 | Stop: 2021-03-20

## 2021-03-20 MED ADMIN — lactated Ringers infusion: 10 mL/h | INTRAVENOUS | @ 15:00:00 | Stop: 2021-03-20

## 2021-03-20 NOTE — Unmapped (Signed)
OPERATIVE NOTE FOR STANDARD UTERINE VACUUM ASPIRATION    Patient: Elaine Koch  Medical Record Number: [id number]  Date of Surgery: 03/20/21  Surgeon: Marisa Cyphers MD (Attending Physician)  Assistant(s): Blima Rich , MD (Family Planning Fellow), Saintclair Halsted , MD (Resident Physician)    Procedure: Standard Uterine Vacuum Aspiration     Indications:  Elaine Koch is a 36 y.o. W1X9147 at [redacted] wks gestation who desires uterine vacuum aspiration for Maternal indication. Patient is status post renal and pancreas transplant with worsening renal function secondary to pregnancy.     For contraception, patient desires none    Anesthesia: MAC    IVF: 650 mL  UOP: 20 mL  EBL: 50 mL    Findings:  1. Gritty texture of uterus following procedure  2. Products of conception consistent with gestational age seen on gross examination of specimen    Details: The patient was taken to the operating room where she was placed under anesthesia without complication. Prepped and draped in sterile fashion. A speculum was placed in the patient's vagina.  A single-tooth tenaculum was then applied to the cervix. A paracervical block was then placed using 20mL of 0.5% lidocaine. The cervix was then dilated using Pratt dilators to a size 43  [place]. The size 14 suction curette was advanced under ultrasound guidance, and the contents of the uterus were removed.  An 8mm suction cannula was also used until a gritty texture was noted by the surgeon and a thin endometrial stripe was noted on ultrasound.      The tenaculum was removed from the anterior lip of the patient's cervix, which was noted to be hemostatic. The speculum was then removed from the patient's vagina.     The products of conception were examined and found to be consistent with a 14 week fetus.    The patient tolerated the procedure well.     Sponge, lap, and needle counts were correct.     The patient was taken to the recovery room in stable condition. Complications: none     Condition: stable to RR    Specimens:  POC--> Disposal    Abby Lucky Cowboy      I was present for the entirety of the procedure(s). Gasper Lloyd, MD

## 2021-03-21 NOTE — Unmapped (Signed)
No post op call needed

## 2021-03-21 NOTE — Unmapped (Signed)
I had a conversation with Elaine Koch on 1/24, and again on 1/30, and she is concerned about the medical implications of her pregnancy. She has been stable since her kidney-pancreas transplant, but we reviewed how that can change. Further, worsening renal disease is also associated with preeclampsia, preterm delivery, IUGR, and a long NICU stay, all of which she is familiar with since her last pregnancy ended due to severe preeclampsia at 27 weeks, and her son has residual deficits from preterm delivery. Thus, I believe her termination of pregnancy is (was) medically necessary.    Jamal Collin, MD

## 2021-03-22 DIAGNOSIS — Z940 Kidney transplant status: Principal | ICD-10-CM

## 2021-03-22 MED ORDER — ENVARSUS XR 4 MG TABLET,EXTENDED RELEASE
ORAL_TABLET | Freq: Every day | ORAL | 11 refills | 30 days | Status: CN
Start: 2021-03-22 — End: 2022-03-22

## 2021-03-22 MED ORDER — ENVARSUS XR 1 MG TABLET,EXTENDED RELEASE
ORAL_TABLET | Freq: Every day | ORAL | 11 refills | 30 days | Status: CN
Start: 2021-03-22 — End: 2022-03-22

## 2021-03-22 MED ORDER — TACROLIMUS XR 1 MG TABLET,EXTENDED RELEASE 24 HR
ORAL_TABLET | Freq: Every day | ORAL | 11 refills | 30 days | Status: CP
Start: 2021-03-22 — End: 2021-03-22
  Filled 2021-03-22: qty 150, 30d supply, fill #0

## 2021-03-22 MED ORDER — TACROLIMUS XR 4 MG TABLET,EXTENDED RELEASE 24 HR
ORAL_TABLET | Freq: Every day | ORAL | 11 refills | 30.00000 days | Status: CP
Start: 2021-03-22 — End: 2021-03-22

## 2021-03-22 MED FILL — AZATHIOPRINE 50 MG TABLET: ORAL | 30 days supply | Qty: 60 | Fill #2

## 2021-03-22 MED FILL — SODIUM BICARBONATE 650 MG TABLET: ORAL | 30 days supply | Qty: 180 | Fill #1

## 2021-03-22 MED FILL — ASPIRIN 81 MG TABLET,DELAYED RELEASE: ORAL | 30 days supply | Qty: 30 | Fill #10

## 2021-03-22 NOTE — Unmapped (Signed)
Elaine Koch 's Envarsus XR 1mg  tablet shipment will be canceled  as a result of dose change that no longer requires this strength.     We will not reschedule the medication and have removed this/these medication(s) from the work request.  We have canceled this work request.

## 2021-03-22 NOTE — Unmapped (Signed)
[facility] Refill Coordination Note    Specialty Medication(s) to be Shipped:   Transplant: Envarsus 1mg , Envarsus 4mg  and Azathioprine 50mg     Other medication(s) to be shipped: aspirin and sodium bicarb     Elaine Koch, DOB: 15-Mar-1985  Phone: [phone] (home)       All above HIPAA information was verified with patient.     Was a Nurse, learning disability used for this call? No    Completed refill call assessment today to schedule patient's medication shipment from the [facility] [phone]).  All relevant notes have been reviewed.     Specialty medication(s) and dose(s) confirmed: Patient reports changes to the regimen as follows: Envarsus is 20mg  daily **sent rf request**   Changes to medications: Mikhaila reports no changes at this time.  Changes to insurance: No  New side effects reported not previously addressed with a pharmacist or physician: None reported  Questions for the pharmacist: No    Confirmed patient received a Conservation officer, historic buildings and a Surveyor, mining with first shipment. The patient will receive a drug information handout for each medication shipped and additional FDA Medication Guides as required.       DISEASE/MEDICATION-SPECIFIC INFORMATION        N/A    SPECIALTY MEDICATION ADHERENCE     Medication Adherence    Patient reported X missed doses in the last month: 0  Specialty Medication: tacrolimus (ENVARSUS XR) 1 mg Tb24 extended release tablet  Patient is on additional specialty medications: Yes  Additional Specialty Medications: tacrolimus (ENVARSUS XR) 4 mg Tb24 extended release tablet  Patient Reported Additional Medication X Missed Doses in the Last Month: 0  Patient is on more than two specialty medications: Yes  Specialty Medication: Azathioprine 50mg   Patient Reported Additional Medication X Missed Doses in the Last Month: 0        Were doses missed due to medication being on hold? No    Envarsus 1 mg: 3 days of medicine on hand   Envarsus 4 mg: 3 days of medicine on hand   Azathioprine 50 mg: 3 days of medicine on hand     REFERRAL TO PHARMACIST     Referral to the pharmacist: Not needed      [facility]     Shipping address confirmed in Epic.     Delivery Scheduled: Yes, Expected medication delivery date: 03/23/2021.     Medication will be delivered via UPS to the prescription address in Epic WAM.    Lorelei Pont [facility] Specialty Technician

## 2021-03-22 NOTE — Unmapped (Addendum)
[facility] Pharmacist has reviewed this new prescription.  Patient was counseled on this dosage change by TK- see epic note from 03/22/21.  Next refill call date adjusted if necessary.          Clinical Assessment Needed For: Dose Change  Medication: Envarsus XR 4mg  tablet  Last Fill Date/Day Supply: 02/22/2021 / 30 days  Copay $0  Was previous dose already scheduled to fill: Yes    Notes to Pharmacist: Scheduled to fill today, 02/09. Pt aware of change.

## 2021-04-02 DIAGNOSIS — Z940 Kidney transplant status: Principal | ICD-10-CM

## 2021-04-13 NOTE — Unmapped (Signed)
[facility] Refill Coordination Note    Specialty Medication(s) to be Shipped:   Transplant: Envarsus 4mg     Other medication(s) to be shipped: Azathioprine 50mg ,Aspirin 81mg ,Sodium Bicarb     Elaine Koch, DOB: [birth year]  Phone: [phone] (home)       All above HIPAA information was verified with patient.     Was a Nurse, learning disability used for this call? No    Completed refill call assessment today to schedule patient's medication shipment from the [facility] [phone]).  All relevant notes have been reviewed.     Specialty medication(s) and dose(s) confirmed: Regimen is correct and unchanged.   Changes to medications: [name] reports no changes at this time.  Changes to insurance: No  New side effects reported not previously addressed with a pharmacist or physician: None reported  Questions for the pharmacist: No    Confirmed patient received a Conservation officer, historic buildings and a Surveyor, mining with first shipment. The patient will receive a drug information handout for each medication shipped and additional FDA Medication Guides as required.       DISEASE/MEDICATION-SPECIFIC INFORMATION        N/A    SPECIALTY MEDICATION ADHERENCE     Medication Adherence    Patient reported X missed doses in the last month: 0  Specialty Medication: azathioprine 50mg   Patient is on additional specialty medications: Yes  Additional Specialty Medications: envarsus  Patient Reported Additional Medication X Missed Doses in the Last Month: 0  Patient is on more than two specialty medications: No  Any gaps in refill history greater than 2 weeks in the last 3 months: no  Demonstrates understanding of importance of adherence: yes  Informant: patient  Reliability of informant: reliable  Provider-estimated medication adherence level: good  Patient is at risk for Non-Adherence: No  Reasons for non-adherence: no problems identified  Confirmed plan for next specialty medication refill: delivery by pharmacy  Refills needed for supportive medications: not needed          Refill Coordination    Has the Patients' Contact Information Changed: No  Is the Shipping Address Different: No         Were doses missed due to medication being on hold? No    Azathioprine 50 mg: 10 days of medicine on hand   Envarsus 4 mg: 10 days of medicine on hand         REFERRAL TO PHARMACIST     Referral to the pharmacist: Not needed      [facility]     Shipping address confirmed in Epic.     Delivery Scheduled: Yes, Expected medication delivery date: 03/08.     Medication will be delivered via UPS to the prescription address in Epic WAM.    [name]   [facility] Specialty Technician

## 2021-04-16 DIAGNOSIS — Z940 Kidney transplant status: Principal | ICD-10-CM

## 2021-04-17 MED FILL — ASPIRIN 81 MG TABLET,DELAYED RELEASE: ORAL | 30 days supply | Qty: 30 | Fill #11

## 2021-04-17 MED FILL — AZATHIOPRINE 50 MG TABLET: ORAL | 30 days supply | Qty: 60 | Fill #3

## 2021-04-17 MED FILL — SODIUM BICARBONATE 650 MG TABLET: ORAL | 30 days supply | Qty: 180 | Fill #2

## 2021-04-17 MED FILL — ENVARSUS XR 4 MG TABLET,EXTENDED RELEASE: ORAL | 30 days supply | Qty: 150 | Fill #1

## 2021-04-24 NOTE — Unmapped (Signed)
Called to check in with patient.  Pt reports she has not been able to get labs since her son's school schedule changed but she will try to go this Friday.  She is also open to scheduling an appt to see Dr. Carlene Coria. (she is not available 3/16 or 23) but will make one once she is able to come in.  Pt appreciated the call, she is doing ok.  She reports she is home today due to a migraine but has been hydrating well.

## 2021-04-30 DIAGNOSIS — Z940 Kidney transplant status: Principal | ICD-10-CM

## 2021-05-08 LAB — BASIC METABOLIC PANEL
BLOOD UREA NITROGEN: 20 mg/dL (ref 6–20)
BUN / CREAT RATIO: 11 (ref 9–23)
CALCIUM: 9.4 mg/dL (ref 8.7–10.2)
CHLORIDE: 109 mmol/L — ABNORMAL HIGH (ref 96–106)
CO2: 21 mmol/L (ref 20–29)
CREATININE: 1.81 mg/dL — ABNORMAL HIGH (ref 0.57–1.00)
EGFR: 37 mL/min/{1.73_m2} — ABNORMAL LOW
GLUCOSE: 92 mg/dL (ref 70–99)
POTASSIUM: 4.9 mmol/L (ref 3.5–5.2)
SODIUM: 142 mmol/L (ref 134–144)

## 2021-05-08 LAB — CBC W/ DIFFERENTIAL
BANDED NEUTROPHILS ABSOLUTE COUNT: 0 10*3/uL (ref 0.0–0.1)
BASOPHILS ABSOLUTE COUNT: 0 10*3/uL (ref 0.0–0.2)
BASOPHILS RELATIVE PERCENT: 1 %
EOSINOPHILS ABSOLUTE COUNT: 0.5 10*3/uL — ABNORMAL HIGH (ref 0.0–0.4)
EOSINOPHILS RELATIVE PERCENT: 12 %
HEMATOCRIT: 29.2 % — ABNORMAL LOW (ref 34.0–46.6)
HEMOGLOBIN: 9.9 g/dL — ABNORMAL LOW (ref 11.1–15.9)
IMMATURE GRANULOCYTES: 0 %
LYMPHOCYTES ABSOLUTE COUNT: 0.8 10*3/uL (ref 0.7–3.1)
LYMPHOCYTES RELATIVE PERCENT: 20 %
MEAN CORPUSCULAR HEMOGLOBIN CONC: 33.9 g/dL (ref 31.5–35.7)
MEAN CORPUSCULAR HEMOGLOBIN: 34.3 pg — ABNORMAL HIGH (ref 26.6–33.0)
MEAN CORPUSCULAR VOLUME: 101 fL — ABNORMAL HIGH (ref 79–97)
MONOCYTES ABSOLUTE COUNT: 0.3 10*3/uL (ref 0.1–0.9)
MONOCYTES RELATIVE PERCENT: 6 %
NEUTROPHILS ABSOLUTE COUNT: 2.6 10*3/uL (ref 1.4–7.0)
NEUTROPHILS RELATIVE PERCENT: 61 %
PLATELET COUNT: 279 10*3/uL (ref 150–450)
RED BLOOD CELL COUNT: 2.89 x10E6/uL — ABNORMAL LOW (ref 3.77–5.28)
RED CELL DISTRIBUTION WIDTH: 16.6 % — ABNORMAL HIGH (ref 11.7–15.4)
WHITE BLOOD CELL COUNT: 4.2 10*3/uL (ref 3.4–10.8)

## 2021-05-08 LAB — MAGNESIUM: MAGNESIUM: 1.6 mg/dL (ref 1.6–2.3)

## 2021-05-08 LAB — PHOSPHORUS: PHOSPHORUS, SERUM: 3.5 mg/dL (ref 3.0–4.3)

## 2021-05-09 LAB — URINALYSIS
BILIRUBIN UA: NEGATIVE
GLUCOSE UA: NEGATIVE
KETONES UA: NEGATIVE
LEUKOCYTE ESTERASE UA: NEGATIVE
NITRITE UA: NEGATIVE
PH UA: 7.5 (ref 5.0–7.5)
PROTEIN UA: NEGATIVE
SPECIFIC GRAVITY UA: 1.015 (ref 1.005–1.030)
UROBILINOGEN UA: 0.2 mg/dL (ref 0.2–1.0)

## 2021-05-09 LAB — MICROSCOPIC EXAMINATION
BACTERIA: NONE SEEN
CASTS: NONE SEEN /LPF
RBC URINE: NONE SEEN /HPF (ref 0–2)

## 2021-05-09 MED ORDER — ASPIRIN 81 MG TABLET,DELAYED RELEASE
ORAL_TABLET | Freq: Every day | ORAL | 11 refills | 30 days | Status: CP
Start: 2021-05-09 — End: 2022-05-09
  Filled 2021-05-16: qty 30, 30d supply, fill #0

## 2021-05-09 NOTE — Unmapped (Signed)
[facility] Refill Coordination Note    Specialty Medication(s) to be Shipped:   Transplant: Envarsus 4mg  and Azathioprine 50mg     Other medication(s) to be shipped: aspirin and sodium bicarb     Elaine Koch, DOB: April 06, 1985  Phone: [phone] (home)       All above HIPAA information was verified with patient.     Was a Nurse, learning disability used for this call? No    Completed refill call assessment today to schedule patient's medication shipment from the [facility] [phone]).  All relevant notes have been reviewed.     Specialty medication(s) and dose(s) confirmed: Regimen is correct and unchanged.   Changes to medications: Chestine reports no changes at this time.  Changes to insurance: No  New side effects reported not previously addressed with a pharmacist or physician: None reported  Questions for the pharmacist: No    Confirmed patient received a Conservation officer, historic buildings and a Surveyor, mining with first shipment. The patient will receive a drug information handout for each medication shipped and additional FDA Medication Guides as required.       DISEASE/MEDICATION-SPECIFIC INFORMATION        N/A    SPECIALTY MEDICATION ADHERENCE     Medication Adherence    Patient reported X missed doses in the last month: 0  Specialty Medication: Azathioprine 50mg   Patient is on additional specialty medications: Yes  Additional Specialty Medications: Envarsus 4mg   Patient Reported Additional Medication X Missed Doses in the Last Month: 0  Patient is on more than two specialty medications: No        Were doses missed due to medication being on hold? No    Azathioprone 50 mg: 10 days of medicine on hand   Envarsus 4 mg: 10 days of medicine on hand     REFERRAL TO PHARMACIST     Referral to the pharmacist: Not needed      [facility]     Shipping address confirmed in Epic.     Delivery Scheduled: Yes, Expected medication delivery date: 05/17/2021.     Medication will be delivered via UPS to the prescription address in Epic WAM.    Lorelei Pont [facility] Specialty Technician

## 2021-05-09 NOTE — Unmapped (Signed)
Pt request for RX Refill aspirin (ECOTRIN) 81 MG tablet

## 2021-05-14 DIAGNOSIS — Z940 Kidney transplant status: Principal | ICD-10-CM

## 2021-05-15 DIAGNOSIS — Z940 Kidney transplant status: Principal | ICD-10-CM

## 2021-05-15 NOTE — Unmapped (Signed)
Reviewed recent labs with Dr. Carlene Coria, Pt advised to decrease envarsus dose to 18 mg daily and repeat labs in 1-2 weeks.  Reviewed other labs with pt as creat 1.8 was also elevated from baseline.  Pt reports normal BP but also has been more  tired lately.  Denies swelling or any changes in diet or activity.  Pt agreed to increase fluids and repeat labs as soon as she is able.

## 2021-05-15 NOTE — Unmapped (Signed)
UNOS form

## 2021-05-16 DIAGNOSIS — Z940 Kidney transplant status: Principal | ICD-10-CM

## 2021-05-16 MED ORDER — TACROLIMUS XR 4 MG TABLET,EXTENDED RELEASE 24 HR
ORAL_TABLET | Freq: Every day | ORAL | 11 refills | 30 days | Status: CP
Start: 2021-05-16 — End: 2022-05-16
  Filled 2021-05-16: qty 150, 30d supply, fill #0

## 2021-05-16 MED FILL — AZATHIOPRINE 50 MG TABLET: ORAL | 30 days supply | Qty: 60 | Fill #4

## 2021-05-16 MED FILL — SODIUM BICARBONATE 650 MG TABLET: ORAL | 30 days supply | Qty: 180 | Fill #3

## 2021-05-28 DIAGNOSIS — Z940 Kidney transplant status: Principal | ICD-10-CM

## 2021-06-11 DIAGNOSIS — Z940 Kidney transplant status: Principal | ICD-10-CM

## 2021-06-14 NOTE — Unmapped (Signed)
The [facility] has made a second and final attempt to reach this patient to refill the following medication:Envarsus.      We have been unable to leave messages on the following phone numbers: [phone] .    Dates contacted: 06/08/21  06/14/21  Last scheduled delivery:  05/16/21    The patient may be at risk of non-compliance with this medication. The patient should call the [facility] at [phone]  Option 4, then Option 2 (all other specialty patients) to refill medication.    [place] A Mccayla Shimada   [facility] Specialty Technician

## 2021-06-15 NOTE — Unmapped (Signed)
In error

## 2021-06-18 DIAGNOSIS — Z940 Kidney transplant status: Principal | ICD-10-CM

## 2021-06-18 MED ORDER — TACROLIMUS XR 1 MG TABLET,EXTENDED RELEASE 24 HR
ORAL_TABLET | Freq: Every day | ORAL | 11 refills | 30 days | Status: CP
Start: 2021-06-18 — End: 2022-06-18

## 2021-06-18 MED ORDER — TACROLIMUS XR 4 MG TABLET,EXTENDED RELEASE 24 HR
ORAL_TABLET | Freq: Every day | ORAL | 11 refills | 30 days | Status: CP
Start: 2021-06-18 — End: 2022-06-18
  Filled 2021-06-20: qty 60, 30d supply, fill #0

## 2021-06-18 MED ORDER — SODIUM BICARBONATE 650 MG TABLET
ORAL_TABLET | Freq: Three times a day (TID) | ORAL | 3 refills | 30 days | Status: CP
Start: 2021-06-18 — End: 2022-06-18
  Filled 2021-06-20: qty 180, 30d supply, fill #0

## 2021-06-18 NOTE — Unmapped (Signed)
Pt request for RX Refill sodium bicarbonate 650 mg tablet

## 2021-06-18 NOTE — Unmapped (Signed)
[facility] Clinical Assessment & Refill Coordination Note    Elaine Koch, [place]: [birth year]  Phone: [phone] (home)     All above HIPAA information was verified with patient.     Was a Nurse, learning disability used for this call? 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 aspirin (ECOTRIN) 81 MG tablet Take 1 tablet (81 mg total) by mouth daily. 30 tablet 11   ??? azaTHIOprine (IMURAN) 50 mg tablet Take 2 tablets (100 mg total) by mouth daily 60 tablet 11   ??? magnesium oxide (MAG-OX) 400 mg (241.3 mg elemental magnesium) tablet Take 1 tablet (400 mg total) by mouth daily. 30 tablet 11   ??? metroNIDAZOLE (METROCREAM) 0.75 % cream Apply topically Two (2) times a day. 45 g 0   ??? multivitamin, prenatal, folic acid-iron, 29 mg iron- 1 mg Take 1 tablet by mouth daily. 90 tablet 5   ??? NIFEdipine (PROCARDIA XL) 30 MG 24 hr tablet Take 1 tablet (30 mg total) by mouth daily. 90 tablet 3   ??? sodium bicarbonate 650 mg tablet Take 2 tablets (1,300 mg total) by mouth Three (3) times a day. 180 tablet 3   ??? tacrolimus (ENVARSUS XR) 1 mg Tb24 extended release tablet Take 2 tablets (2 mg total) by mouth daily. Take  2 tablets ( 2 mg total) in addition to 4 tablets ( 16 mg total)  for a total daily dose of 18 mg daily 60 tablet 11   ??? tacrolimus (ENVARSUS XR) 4 mg Tb24 extended release tablet Take 4 tablets (16 mg total) by mouth daily. Take 4 tablets ( total 16 mg ) daily.   In addition to 2 tablets ( 2 mg total) for a total daily dose of 18 mg daily 120 tablet 11   ??? tacrolimus (ENVARSUS XR) 4 mg Tb24 extended release tablet Take 5 tablets (20 mg total) by mouth daily. (Patient taking differently: Take 18 mg by mouth daily. Taking 4x4mg  and 2x1mg  tablets) 150 tablet 11     No current facility-administered medications for this visit.        Changes to medications: Diona reports no changes at this time.    Allergies   Allergen Reactions   ??? Iodinated Contrast Media Swelling, Rash and Other (See Comments)     Burning, warmth throughout body and tingling.  Throat swelling.  Treated with benadryl and symptoms improved.  Has not had contrast since then (2013 or 2014).   ??? Nickel Rash   ??? Propranolol Swelling   ??? Eye Irrigating Solution [Ophthalmic Irrigation Solution] Other (See Comments)     Contrast dye (name?) used in eyes caused hot feeling in face, reversed with Benadryl.    ??? Iodine      Other reaction(s): Skin Rash   ??? Naltrexone Rash   ??? Uni-Cortrom Rash       Changes to allergies: No    SPECIALTY MEDICATION ADHERENCE     Azathioprine 50mg   : 7 days of medicine on hand   envarsus 1mg   : 7 days of medicine on hand   envarsus 4mg   : 7 days of medicine on hand       Medication Adherence    Patient reported X missed doses in the last month: 0  Specialty Medication: envarsus 1mg   Patient is on additional specialty medications: Yes  Additional Specialty Medications: Envarsus 4mg   Patient Reported Additional Medication X Missed Doses in the Last Month: 0  Patient is on more than two specialty medications: Yes  Specialty Medication: azathioprine 50mg   Patient Reported Additional Medication X Missed Doses in the Last Month: 0          Specialty medication(s) dose(s) confirmed: Patient reports changes to the regimen as follows: envarsus is 18mg  daily, requesting new rxs today     Are there any concerns with adherence? No    Adherence counseling provided? Not needed    CLINICAL MANAGEMENT AND INTERVENTION      Clinical Benefit Assessment:    Do you feel the medicine is effective or helping your condition? Yes    Clinical Benefit counseling provided? Not needed    Adverse Effects Assessment:    Are you experiencing any side effects? No    Are you experiencing difficulty administering your medicine? No    Quality of Life Assessment:         How many days over the past month did your transplant  keep you from your normal activities? For example, brushing your teeth or getting up in the morning. 0    Have you discussed this with your provider? Not needed    Acute Infection Status:    Acute infections noted within Epic:  No active infections  Patient reported infection: None    Therapy Appropriateness:    Is therapy appropriate and patient progressing towards therapeutic goals? Yes, therapy is appropriate and should be continued    DISEASE/MEDICATION-SPECIFIC INFORMATION      N/A    PATIENT SPECIFIC NEEDS     - Does the patient have any physical, cognitive, or cultural barriers? No    - Is the patient high risk? No    - Does the patient require a Care Management Plan? No     SOCIAL DETERMINANTS OF HEALTH     At the [facility], we have learned that life circumstances - like trouble affording food, housing, utilities, or transportation can affect the health of many of our patients.   That is why we wanted to ask: are you currently experiencing any life circumstances that are negatively impacting your health and/or quality of life? No    Social Determinants of Psychologist, prison and probation services Strain: Not on file   Internet Connectivity: Not on file   Food Insecurity: Not on file   Tobacco Use: Low Risk    ??? Smoking Tobacco Use: Never   ??? Smokeless Tobacco Use: Never   ??? Passive Exposure: Not on file   Housing/Utilities: Not on file   Alcohol Use: Not on file   Transportation Needs: Not on file   Substance Use: Not on file   Health Literacy: Not on file   Physical Activity: Not on file   Interpersonal Safety: Not on file   Stress: Not on file   Intimate Partner Violence: Not on file   Depression: Not on file   Social Connections: Not on file       Would you be willing to receive help with any of the needs that you have identified today? Not applicable       SHIPPING     Specialty Medication(s) to be Shipped:   Transplant: Envarsus 1mg , Envarsus 4mg  and azathioprine 50mg     Other medication(s) to be shipped: aspirin, sodium bicarb     Changes to insurance: No    Delivery Scheduled: Yes, Expected  medication delivery date: 06/21/2021.  However, Rx request for refills was sent to the provider as there are none remaining.     Medication will be delivered via UPS to the confirmed prescription address in [facility].    The patient will receive a drug information handout for each medication shipped and additional FDA Medication Guides as required.  Verified that patient has previously received a Conservation officer, historic buildings and a Surveyor, mining.    The patient or caregiver noted above participated in the development of this care plan and knows that they can request review of or adjustments to the care plan at any time.      All of the patient's questions and concerns have been addressed.    Thad Ranger   [facility] Specialty Pharmacist

## 2021-06-18 NOTE — Unmapped (Signed)
5/8: disegard below note, spoke with patient today -ef    The [facility] has made a third and final attempt to reach this patient to refill the following medication:envarsus, azathioprine, maintenance meds.      We have left voicemails on the following phone numbers: [phone]  and have sent a MyChart message.    Dates contacted: 4/28, 5/4, 5/8  Last scheduled delivery: 4/5    The patient may be at risk of non-compliance with this medication. The patient should call the [facility] at [phone]  Option 4, then Option 2 (all other specialty patients) to refill medication.    Elaine Koch   [facility] Specialty Pharmacist

## 2021-06-20 MED FILL — ENVARSUS XR 4 MG TABLET,EXTENDED RELEASE: ORAL | 30 days supply | Qty: 120 | Fill #0

## 2021-06-20 MED FILL — ASPIRIN 81 MG TABLET,DELAYED RELEASE: ORAL | 30 days supply | Qty: 30 | Fill #1

## 2021-06-20 MED FILL — AZATHIOPRINE 50 MG TABLET: ORAL | 30 days supply | Qty: 60 | Fill #5

## 2021-06-25 DIAGNOSIS — Z940 Kidney transplant status: Principal | ICD-10-CM

## 2021-07-06 DIAGNOSIS — Z940 Kidney transplant status: Principal | ICD-10-CM

## 2021-07-09 DIAGNOSIS — Z940 Kidney transplant status: Principal | ICD-10-CM

## 2021-07-20 NOTE — Unmapped (Signed)
[facility] Refill Coordination Note    Specialty Medication(s) to be Shipped:   Transplant: Envarsus 1mg , Envarsus 4mg  and azathioprine 50mg     Other medication(s) to be shipped: aspirin, sodium bicarb     Elaine Koch, DOB: [birth year]  Phone: [phone] (home)       All above HIPAA information was verified with patient.     Was a Nurse, learning disability used for this call? No    Completed refill call assessment today to schedule patient's medication shipment from the [facility] [phone]).  All relevant notes have been reviewed.     Specialty medication(s) and dose(s) confirmed: Regimen is correct and unchanged.   Changes to medications: Elaine Koch reports no changes at this time.  Changes to insurance: No  New side effects reported not previously addressed with a pharmacist or physician: None reported  Questions for the pharmacist: No    Confirmed patient received a Conservation officer, historic buildings and a Surveyor, mining with first shipment. The patient will receive a drug information handout for each medication shipped and additional FDA Medication Guides as required.       DISEASE/MEDICATION-SPECIFIC INFORMATION        N/A    SPECIALTY MEDICATION ADHERENCE     Medication Adherence    Patient reported X missed doses in the last month: 0  Specialty Medication: Envarsus Xr 1mg   Patient is on additional specialty medications: Yes  Additional Specialty Medications: Envarsus Xr 4mg   Patient Reported Additional Medication X Missed Doses in the Last Month: 0  Patient is on more than two specialty medications: Yes  Specialty Medication: Azathioprine 50mg   Patient Reported Additional Medication X Missed Doses in the Last Month: 0              Were doses missed due to medication being on hold? No    Azathioprine 50mg   : 8 days of medicine on hand   envarsus 1mg   : 8 days of medicine on hand   envarsus 4mg   : 8 days of medicine on hand       REFERRAL TO PHARMACIST     Referral to the pharmacist: Not needed      [facility]     Shipping address confirmed in Epic.     Delivery Scheduled: Yes, Expected medication delivery date: 07/24/2021.     Medication will be delivered via UPS to the prescription address in Epic WAM.    Thad Ranger   [facility] Specialty Pharmacist

## 2021-07-23 DIAGNOSIS — Z940 Kidney transplant status: Principal | ICD-10-CM

## 2021-07-23 MED FILL — SODIUM BICARBONATE 650 MG TABLET: ORAL | 30 days supply | Qty: 180 | Fill #1

## 2021-07-23 MED FILL — ENVARSUS XR 4 MG TABLET,EXTENDED RELEASE: ORAL | 30 days supply | Qty: 120 | Fill #1

## 2021-07-23 MED FILL — ASPIRIN 81 MG TABLET,DELAYED RELEASE: ORAL | 30 days supply | Qty: 30 | Fill #2

## 2021-07-23 MED FILL — ENVARSUS XR 1 MG TABLET,EXTENDED RELEASE: ORAL | 30 days supply | Qty: 60 | Fill #1

## 2021-07-23 MED FILL — AZATHIOPRINE 50 MG TABLET: ORAL | 30 days supply | Qty: 60 | Fill #6

## 2021-07-27 LAB — CBC W/ DIFFERENTIAL
BANDED NEUTROPHILS ABSOLUTE COUNT: 0 10*3/uL (ref 0.0–0.1)
BASOPHILS ABSOLUTE COUNT: 0 10*3/uL (ref 0.0–0.2)
BASOPHILS RELATIVE PERCENT: 1 %
EOSINOPHILS ABSOLUTE COUNT: 0.9 10*3/uL — ABNORMAL HIGH (ref 0.0–0.4)
EOSINOPHILS RELATIVE PERCENT: 18 %
HEMATOCRIT: 27.2 % — ABNORMAL LOW (ref 34.0–46.6)
HEMOGLOBIN: 9.7 g/dL — ABNORMAL LOW (ref 11.1–15.9)
IMMATURE GRANULOCYTES: 0 %
LYMPHOCYTES ABSOLUTE COUNT: 0.8 10*3/uL (ref 0.7–3.1)
LYMPHOCYTES RELATIVE PERCENT: 17 %
MEAN CORPUSCULAR HEMOGLOBIN CONC: 35.7 g/dL (ref 31.5–35.7)
MEAN CORPUSCULAR HEMOGLOBIN: 36.2 pg — ABNORMAL HIGH (ref 26.6–33.0)
MEAN CORPUSCULAR VOLUME: 102 fL — ABNORMAL HIGH (ref 79–97)
MONOCYTES ABSOLUTE COUNT: 0.2 10*3/uL (ref 0.1–0.9)
MONOCYTES RELATIVE PERCENT: 4 %
NEUTROPHILS ABSOLUTE COUNT: 2.9 10*3/uL (ref 1.4–7.0)
NEUTROPHILS RELATIVE PERCENT: 60 %
PLATELET COUNT: 225 10*3/uL (ref 150–450)
RED BLOOD CELL COUNT: 2.68 x10E6/uL — CL (ref 3.77–5.28)
RED CELL DISTRIBUTION WIDTH: 14.9 % (ref 11.7–15.4)
WHITE BLOOD CELL COUNT: 4.8 10*3/uL (ref 3.4–10.8)

## 2021-07-28 LAB — URINALYSIS
BILIRUBIN UA: NEGATIVE
BLOOD UA: NEGATIVE
GLUCOSE UA: NEGATIVE
KETONES UA: NEGATIVE
NITRITE UA: NEGATIVE
PH UA: 7 (ref 5.0–7.5)
PROTEIN UA: NEGATIVE
SPECIFIC GRAVITY UA: 1.015 (ref 1.005–1.030)
UROBILINOGEN UA: 0.2 mg/dL (ref 0.2–1.0)

## 2021-07-28 LAB — BASIC METABOLIC PANEL
BLOOD UREA NITROGEN: 25 mg/dL — ABNORMAL HIGH (ref 6–20)
BUN / CREAT RATIO: 13 (ref 9–23)
CALCIUM: 9.3 mg/dL (ref 8.7–10.2)
CHLORIDE: 107 mmol/L — ABNORMAL HIGH (ref 96–106)
CO2: 21 mmol/L (ref 20–29)
CREATININE: 1.86 mg/dL — ABNORMAL HIGH (ref 0.57–1.00)
EGFR: 36 mL/min/{1.73_m2} — ABNORMAL LOW
GLUCOSE: 90 mg/dL (ref 70–99)
POTASSIUM: 5 mmol/L (ref 3.5–5.2)
SODIUM: 140 mmol/L (ref 134–144)

## 2021-07-28 LAB — MICROSCOPIC EXAMINATION
BACTERIA: NONE SEEN
CASTS: NONE SEEN /LPF
RBC URINE: NONE SEEN /HPF (ref 0–2)

## 2021-07-28 LAB — MAGNESIUM: MAGNESIUM: 1.7 mg/dL (ref 1.6–2.3)

## 2021-07-28 LAB — PHOSPHORUS: PHOSPHORUS, SERUM: 3.3 mg/dL (ref 3.0–4.3)

## 2021-07-29 LAB — TACROLIMUS LEVEL: TACROLIMUS BLOOD: 5.3 ng/mL (ref 2.0–20.0)

## 2021-08-06 DIAGNOSIS — Z940 Kidney transplant status: Principal | ICD-10-CM

## 2021-08-20 DIAGNOSIS — Z940 Kidney transplant status: Principal | ICD-10-CM

## 2021-08-24 MED FILL — SODIUM BICARBONATE 650 MG TABLET: ORAL | 30 days supply | Qty: 180 | Fill #2

## 2021-08-24 MED FILL — ASPIRIN 81 MG TABLET,DELAYED RELEASE: ORAL | 30 days supply | Qty: 30 | Fill #3

## 2021-08-24 MED FILL — ENVARSUS XR 1 MG TABLET,EXTENDED RELEASE: ORAL | 30 days supply | Qty: 60 | Fill #2

## 2021-08-24 MED FILL — ENVARSUS XR 4 MG TABLET,EXTENDED RELEASE: ORAL | 30 days supply | Qty: 120 | Fill #2

## 2021-08-24 MED FILL — AZATHIOPRINE 50 MG TABLET: ORAL | 30 days supply | Qty: 60 | Fill #7

## 2021-08-24 NOTE — Unmapped (Signed)
[facility] Refill Coordination Note    Specialty Medication(s) to be Shipped:   Transplant: Envarsus 1mg  and Envarsus 4mg     Other medication(s) to be shipped:  azathioprine 50mg ,sodium bicarb ,aspirin      Elaine Koch, DOB: 03-14-1985  Phone: [phone] (home)       All above HIPAA information was verified with patient.     Was a Nurse, learning disability used for this call? No    Completed refill call assessment today to schedule patient's medication shipment from the [facility] [phone]).  All relevant notes have been reviewed.     Specialty medication(s) and dose(s) confirmed: Regimen is correct and unchanged.   Changes to medications: [name] reports no changes at this time.  Changes to insurance: No  New side effects reported not previously addressed with a pharmacist or physician: None reported  Questions for the pharmacist: No    Confirmed patient received a Conservation officer, historic buildings and a Surveyor, mining with first shipment. The patient will receive a drug information handout for each medication shipped and additional FDA Medication Guides as required.       DISEASE/MEDICATION-SPECIFIC INFORMATION        N/A    SPECIALTY MEDICATION ADHERENCE     Medication Adherence    Patient reported X missed doses in the last month: 0  Specialty Medication: azathioprine 50 mg  Patient is on additional specialty medications: Yes  Additional Specialty Medications: Envarsus 1 mg   Patient Reported Additional Medication X Missed Doses in the Last Month: 0  Patient is on more than two specialty medications: Yes  Specialty Medication: Envarsus 4 mg  Patient Reported Additional Medication X Missed Doses in the Last Month: 0  Informant: patient  Reliability of informant: reliable  Provider-estimated medication adherence level: good  Reasons for non-adherence: no problems identified  Confirmed plan for next specialty medication refill: delivery by pharmacy  Refills needed for supportive medications: not needed          Refill Coordination    Has the Patients' Contact Information Changed: No  Is the Shipping Address Different: No         Were doses missed due to medication being on hold? No    envarsus 4 mg: 5 days of medicine on hand   envarsus 1 mg: 5 days of medicine on hand   azathioprine 50 mg: 5 days of medicine on hand       REFERRAL TO PHARMACIST     Referral to the pharmacist: Not needed      [facility]     Shipping address confirmed in Epic.     Delivery Scheduled: Yes, Expected medication delivery date: 07/17.     Medication will be delivered via UPS to the prescription address in Epic WAM.    [name]   [facility] Specialty Technician

## 2021-08-30 ENCOUNTER — Ambulatory Visit: Admit: 2021-08-30 | Payer: MEDICARE

## 2021-08-30 DIAGNOSIS — D84821 Immunocompromised state due to drug therapy (CMS-HCC): Principal | ICD-10-CM

## 2021-08-30 DIAGNOSIS — D649 Anemia, unspecified: Principal | ICD-10-CM

## 2021-08-30 DIAGNOSIS — Z79899 Other long term (current) drug therapy: Principal | ICD-10-CM

## 2021-08-30 DIAGNOSIS — O09891 Supervision of other high risk pregnancies, first trimester: Principal | ICD-10-CM

## 2021-08-30 DIAGNOSIS — Z9483 Pancreas transplant status: Principal | ICD-10-CM

## 2021-08-30 DIAGNOSIS — Z940 Kidney transplant status: Principal | ICD-10-CM

## 2021-08-30 NOTE — Unmapped (Addendum)
Patient paged on-call coord this morning at 0700 with complaints of ongoing headaches, nausea, fatigue and lower extremity pains. Denied fevers, SOB, weight gain, edema, vomiting, diarrhea, or UTI sxs. She hasn't had labs drawn recently and reported only eating one meal a day for the past several days. Fluid intake has also been low. Patient does not have a local provider or PCP. This Clinical research associate spoke with Dr. Carlene Coria and she suggested pt needs to be sees at the same day internal medicine clinic at [facility] for labs and possible IVF hydration.  Labs ordered and phone number for the clinic given to the pt to call for appt [phone]).     0945 - Unfortunately, the [facility] same day clinic only had one available time slot and it was at 4 pm which would make IVF administration difficult.     This Clinical research associate contacted 3 Urgent Care facilities and found that the [facility] location is able to administer IVF. Contacted patient and let her know this information. Asked her to log into her mychart to schedule a time slot with the urgent care. Explained to the patient that her lab results would determined the treatment plan and that she still may be sent to the ED. She verbalized understanding.    Dr. Carlene Coria and team updated with this plan.

## 2021-09-03 DIAGNOSIS — Z940 Kidney transplant status: Principal | ICD-10-CM

## 2021-09-04 NOTE — Unmapped (Signed)
Attempted to call patient to check in after she paged on call [facility] last week  With c/o dongoing headaches, nausea, fatigue and lower extremity pain.  Pt had been instructed to get labs and possible IVF at urgent care and it does not look like she did either.  Will send patient a mychart message to inquire about ongoing symptoms and a plan.

## 2021-09-14 NOTE — Unmapped (Signed)
[facility] Refill Coordination Note    Specialty Medication(s) to be Shipped:   Transplant: Envarsus 1mg , Envarsus 4mg , and azathioprine 50 mg     Other medication(s) to be shipped:  aspirin and sodium     Elaine Koch, DOB: Nov 08, 1985  Phone: [phone] (home)       All above HIPAA information was verified with patient.     Was a Nurse, learning disability used for this call? No    Completed refill call assessment today to schedule patient's medication shipment from the [facility] [phone]).  All relevant notes have been reviewed.     Specialty medication(s) and dose(s) confirmed: Regimen is correct and unchanged.   Changes to medications: Kerri-Anne reports no changes at this time.  Changes to insurance: No  New side effects reported not previously addressed with a pharmacist or physician: None reported  Questions for the pharmacist: No    Confirmed patient received a Conservation officer, historic buildings and a Surveyor, mining with first shipment. The patient will receive a drug information handout for each medication shipped and additional FDA Medication Guides as required.       DISEASE/MEDICATION-SPECIFIC INFORMATION        N/A    SPECIALTY MEDICATION ADHERENCE     Medication Adherence    Patient reported X missed doses in the last month: 0  Specialty Medication: azathioprine 50 mg  Patient is on additional specialty medications: Yes  Additional Specialty Medications: Envarsus 1 mg   Patient Reported Additional Medication X Missed Doses in the Last Month: 0  Patient is on more than two specialty medications: Yes  Specialty Medication: Envarsus 4 mg  Patient Reported Additional Medication X Missed Doses in the Last Month: 0              Were doses missed due to medication being on hold? No    Azathioprine 50 mg: 10 days of medicine on hand   Envarsus 1 mg: 10 days of medicine on hand   Envarsus 4 mg: 10 days of medicine on hand       REFERRAL TO PHARMACIST     Referral to the pharmacist: Not needed      [facility]     Shipping address confirmed in Epic.     Delivery Scheduled: Yes, Expected medication delivery date: 09/19/21.     Medication will be delivered via UPS to the prescription address in Epic WAM.    [name]   [facility] Specialty Technician

## 2021-09-17 DIAGNOSIS — Z940 Kidney transplant status: Principal | ICD-10-CM

## 2021-09-18 MED FILL — ENVARSUS XR 4 MG TABLET,EXTENDED RELEASE: ORAL | 30 days supply | Qty: 120 | Fill #3

## 2021-09-18 MED FILL — ASPIRIN 81 MG TABLET,DELAYED RELEASE: ORAL | 30 days supply | Qty: 30 | Fill #4

## 2021-09-18 MED FILL — ENVARSUS XR 1 MG TABLET,EXTENDED RELEASE: ORAL | 30 days supply | Qty: 60 | Fill #3

## 2021-09-18 MED FILL — AZATHIOPRINE 50 MG TABLET: ORAL | 30 days supply | Qty: 60 | Fill #8

## 2021-09-18 MED FILL — SODIUM BICARBONATE 650 MG TABLET: ORAL | 30 days supply | Qty: 180 | Fill #3

## 2021-10-01 DIAGNOSIS — Z940 Kidney transplant status: Principal | ICD-10-CM

## 2021-10-09 DIAGNOSIS — E559 Vitamin D deficiency, unspecified: Principal | ICD-10-CM

## 2021-10-09 DIAGNOSIS — Z114 Encounter for screening for human immunodeficiency virus [HIV]: Principal | ICD-10-CM

## 2021-10-09 DIAGNOSIS — E119 Type 2 diabetes mellitus without complications: Principal | ICD-10-CM

## 2021-10-09 DIAGNOSIS — Z79899 Other long term (current) drug therapy: Principal | ICD-10-CM

## 2021-10-09 DIAGNOSIS — Z9483 Pancreas transplant status: Principal | ICD-10-CM

## 2021-10-09 DIAGNOSIS — Z940 Kidney transplant status: Principal | ICD-10-CM

## 2021-10-10 NOTE — Unmapped (Signed)
Transplant Nephrology Clinic Visit      History of Present Illness    Elaine Koch is a 36 y.o. 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 Also had new DSA to DR51 at MFI 1330 on 06/07/20. Screen 03/02/21 had DSA to C17 at MFI 2268, DR51 not present. Her baseline creatinine had been between 1.3-1.6, more recently up to 1.8.    She was on hemodialysis for about 3 years prior to transplant through an AVF. Her diabetes has been complicated by retinopathy. She had pre-eclampsia during pregnancy in 2017. She also had severe anemia requiring multiple blood transfusions during pregnancy and for some time after. In spite of that she had a cPRA of 0% prior to transplant.    She was admitted from 3/21 through 05/11/19 for the initial transplant. Her course was fairly uncomplicated. On POD 8 did have a transient bump in her creatinine (1.39 to 1.57) and amylase (76 to 112), came down to 1.52 and 89 with fluids. [place] of both kidney and pancreas showed patent vessels and normal RIs. After discharge, she had some sugars in the 120s. On 4/2 reported BP 88/58 so amlodipine was held. At surgery visit 4/8 reported loose stools, which she had pre-transplant. Reglan was decreased. BP 80s/40s so all antihypertensives were held. At visit 4/15 pressures remained low so midodrine 5 mg daily was started, reglan further decreased. Her amylase was 467 at that visit so prednisone 5 mg was started since tacrolimus was sub-therapeutic, with plans to taper off once tac stable and pancreatic enzymes normal. At nephrology visit 4/20, amylase down to 179 and tacrolimus level better on TID dosing. Ureteral stent removed 06/10/19. At visit 5/25 planned transition to Envarsus given high tacrolimus requirement. Amylase remained elevated but stable at 149. Blood pressure improved and was no longer hypotensive.     She called the on-call coordinator on 07/11/19 complaining of headache, nausea and vomiting, diarrhea the night prior. No fever. Was able to keep morning meds down. Recommended bland diet, push fluids. On 6/1 called to report BP 143/110 with chest pain, headache and vomiting and was referred to ED. She was admitted from 6/1 through 07/16/19. On admission she was noted to have a creatinine of 3.22 (from 1.19 on 5/28) in the setting of a tacrolimus level of 27.9. Tacrolimus was held and level came down. She was hypotensive and given IVF. She was found to be C.diff positive and started on vancomycin with plans for 10 day course. Creatinine to 1.32 on discharge. Wbc 0.8 on discharge with ANC 0.5. CMV negative on 6/1. Was discharged on Myfortic 720 mg BID.    On 07/20/19 she was noted to have a wbc < 0.6 and ANC < 0.2. She reported no symptoms and was instructed to hold Myfortic and Valcyte on 6/10 and received a dose of Granix. On 6/10 wbc to 0.6 with ANC 0.3. On 6/16 she reported diarrhea returned after stopping vancomycin, course was extended for an additional 14 days. Wbc improved to 3.9 on 6/14 and then 6.1 on 6/17 so Myfortic restarted at 180 mg BID. Creatinine to 1.56 on 6/17 and 1.49 on 6/21, reported had not been drinking as much since back at work. Wbc 8.6 on 6/21 with ANC 7.5. Most recent lipase has been 90-122.    She followed up with  ID on 08/31/19, who placed a referral to Dr. Fara Boros with GI for possible FMT. Patient finished fidaxomicin on 09/06/19 and restarted Vancomycin. When this occurred, patient's diarrhea returned. Patient's vancomycin stopped and she restarted Fidaxomicin on 09/13/19. She completed her Bezlotoxumab on 09/17/19.     She was admitted from 8/20 through 10/03/19 after presenting with LLQ pain, myalgias, headaches, and low grade fevers. Urine grew 50-100K Enterococcus, no other source found, CT abd/pelvis, TTE and renal US all unremarkable. Treated with 10 day course of linezolid.    She had a virtual visit with ID on 11/30/19 for diarrhea.  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 Per note She can continue the Vanco 125mg  4x daily until she is able to start fidaxomicin 200mg  bid for 10 days then decrease to 200mg  daily for secondary ppx.  Patient saw GI on 12/10/19 with a plan for fecal transplant 02/14/2020.  She followed up with ID on 12/24/19 who stated to Decrease Dificid tp 200mg  once daily for secondary ppx until Dec 31 (three days prior to FMT) and to Decrease Valcyte 900mg  qhs for secondary ppx until follow up in early Feb.     She underwent fecal transplant on 02/14/20. On 02/17/20 she called her coordinator and reported feeling poorly since the procedure, with cramping in her lower back, N/V, dysuria and frequency. Denied fever. She had been given cipro by her OB/GYN which we asked her not to take given recent fecal transplant, referred her to ED. She was admitted from 1/6 through 02/22/20. She had creatinine up to 2.34, down to 1.69 on discharge with IVF. Her tacrolimus level was up to 13.5 on 02/21/20, so tac dose was reduced. She grew proteus in her urine and blood, treated with IV cefipime and then transitioned to oral cipro through 02/28/20. She was also found to be COVID positive on admission and received remdesivir. Myfortic and Valcyte were held on discharge due to leukopenia, CMV was not detected.    Myfortic restarted at 180 mg BID on 03/03/20.     She had a telemedicine visit with [facility] on 03/10/20, patient reported recurrence of diarrhea similar to previous C.diff on 03/06/20. Restarted vancomycin BID which helped but did not resolve. Recommended increasing vanc to QID until she could get fidaxomicin filled, planned for 10 day course followed by vanc 1-2 times per day for maintenance. Also recommended 1g D-mannose after intercourse and 12 hours later to see if it helps with UTIs. Anticipated that she would require repeat FMT.    At her visit 03/23/20 she had not yet gotten the fidaxomicin and continued to have diarrhea. She followed up with [facility] 04/11/20 who encouraged her to fill the fidaxomicin as historically she had done better on it. Per the note I will continue to discuss risks/benefits of repeat FMT with Dr. Fara Boros. I wonder if FMT via colonoscopy is the best approach for her. While this is the most effective method, I wonder of the bowel cleanse sent her up for the urosepsis and if she would do better with another type of FMT administration. At visit 05/09/20 she was taking fidaxomicin and had follow up with GI on 05/12/20. At that visit they discussed repeat FMT via upper vs. lower delivery and Dr. Fara Boros thought lower delivery was still the best approach.    She underwent repeat FMT on 06/19/20. She tolerated that well.     Interval history since last visit 09/01/2020    At her visit 09/01/20 she  was interested in pursuing pregnancy but knew that she could not be on Myfortic. Planned to have her see MFM for a consult prior to transitioning from Myfortic to azathioprine.    She called 10/30/20 to report a positive pregnancy test. We stopped Myfortic and started azathioprine. She was seen in the ED 11/02/20 for vaginal bleeding, was seen by OB and felt to most likely be experiencing a miscarriage. Beta HCG was 14,771. On 11/08/20 was having ongoing bleeding and clots. Was encouraged to get repeat bHCG , was 19,019 on 11/16/20. She presented again to [facility] 11/17/20 with lower abdominal pain and bleeding. US showed no clear IUP and bHCG was down to 7672. Underwent D&C and subsequent diagnostic laparoscopy to rule out ectopic pregnancy, fallopian tubes were normal. Hgb dropped from 11 to 9. Creatinine remained at baseline. She was to get follow up bHCG following D&C but they were unable to reach her to get that done.    She called the on-call coordinator 01/29/21 from the ED at [facility] where she had been taken by EMS for severe N/V/abd pain. She had someone transport her from the ED there to [facility] where she was found to have a bHCG of 190,000. US showed pregnancy at [redacted]w[redacted]d. She was able to tolerate po so was discharged home, creatinine was 1.53. She was seen by MFM 02/06/21.     She did not have any labs between 01/29/21 and 02/23/21, creatinine from 1.53 to 1.73. Repeated labs on 02/28/21 and creatinine remained mildly elevated from baseline at 1.65. Since it was still early in pregnancy, we decided to biopsy her to rule out rejection before she was too progressed in her pregnancy. She underwent biopsy 03/02/21 that showed only minor morphological changes. Hgb was down to 7.0, she received 1u PRBCs.    She saw MFM and Dr. Thad Ranger 03/06/21 with plans to start epogen for low Hgb. Wanted to continue the pregnancy at that time, however called 03/12/21 asking to proceed with termination, due to concerns for affects of pregnancy on her kidney as well as having delivered previous pregnancy at 27 weeks. Underwent uterine vacuum aspiration on 03/20/21.     Did not have labs drawn again until 05/08/21, creatinine was elevated to 1.81, tacrolimus level slightly high, dose reduced. Asked to repeat labs in 1-2 weeks.    She called on 08/30/21 complaining of ongoing headaches, nausea, fatigue and pain in lower extremities. Reported low fluid intake and was only eating one meal per day. She did not have a local provider or PCP. There were no slots at same day clinic at [facility], recommended that she go to urgent care locally or the ED. Attempted to call her on 09/04/21 to see if she ever got labs or fluids, were unable to reach her, MyChart message sent.    Did get labs 07/27/21, creatinine remained elevated at 1.86, Hgb 9.7, rest of electrolytes unremarkable. Tacrolimus level was 5.3, was drawn at 11:41 and presumed not a Milus Fritze trough.    She presents today for follow up. She has not had labs since 07/27/21.    She reports a 2 week history of headaches in the morning, last about 30 minutes and resolve without intervention. The symptoms she was experiencing on 08/30/21 resolved. She had N/V for 2 days last week, that resolved without intervention.    She notes hand tremors that started when she started Envarsus. Recently they have worsened. 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    She is not on any birth control and is sexually active. She is open to pregnancy so has not been trying to prevent it.     She specifically denies fever, myalgias, upper respiratory/GI symptoms, or change in taste/smell.    Last dose of Envarsus: 9 am    Review of Systems    Otherwise on review of systems patient denies fever or chills, chest pain, SOB, PND or orthopnea, lower extremity edema. Denies N/V/abdominal pain. No dysuria, hematuria or difficulty voiding. Bowel movements without blood. Denies joint pain or rash. All other systems are reviewed and are negative.    Medications    Current Outpatient Medications   Medication Sig Dispense Refill    aspirin (ECOTRIN) 81 MG tablet Take 1 tablet (81 mg total) by mouth daily. 30 tablet 11    azaTHIOprine (IMURAN) 50 mg tablet Take 2 tablets (100 mg total) by mouth daily 60 tablet 11    magnesium oxide (MAG-OX) 400 mg (241.3 mg elemental magnesium) tablet Take 1 tablet (400 mg total) by mouth daily. 30 tablet 11    metroNIDAZOLE (METROCREAM) 0.75 % cream Apply topically Two (2) times a day. 45 g 0    multivitamin, prenatal, folic acid-iron, 29 mg iron- 1 mg Take 1 tablet by mouth daily. 90 tablet 5    NIFEdipine (PROCARDIA XL) 30 MG 24 hr tablet Take 1 tablet (30 mg total) by mouth daily. 90 tablet 3    sodium bicarbonate 650 mg tablet Take 2 tablets (1,300 mg total) by mouth Three (3) times a day. 180 tablet 3    tacrolimus (ENVARSUS XR) 1 mg Tb24 extended release tablet Take 2 tablets (2 mg total) by mouth daily. Take  2 tablets ( 2 mg total) in addition to 4 tablets ( 16 mg total)  for a total daily dose of 18 mg daily 60 tablet 11    tacrolimus (ENVARSUS XR) 1 mg Tb24 extended release tablet Take 2 tablets (2 mg total) by mouth in the morning. Take FOUR 4 mg tablets (16 mg total) and TWO 1 mg tablet (2 mg total) for a total dose of 18 mg daily. 60 tablet 11    tacrolimus (ENVARSUS XR) 4 mg Tb24 extended release tablet Take 4 tablets (16 mg total) by mouth daily. Take 4 tablets ( total 16 mg ) daily.   In addition to 2 tablets ( 2 mg total) for a total daily dose of 18 mg daily 120 tablet 11    tacrolimus (ENVARSUS XR) 4 mg Tb24 extended release tablet Take 4 tablets (16 mg total) by mouth daily. Take FOUR 4 mg tablets (16 mg total) and TWO 1 mg tablet (2 mg total) for a total dose of 18 mg daily 120 tablet 11     No current facility-administered medications for this visit.       Physical Exam    BP 127/68 (BP Site: L Arm, BP Position: Sitting, BP Cuff Size: Medium)  - Pulse 84  - Temp 36.1 ??C (97 ??F) (Temporal)  - Ht 165.1 cm (5' 5)  - Wt 77.6 kg (171 lb)  - LMP 09/25/2021 (Approximate)  - Breastfeeding Unknown  - BMI 28.46 kg/m??   General: Patient is a pleasant female in no apparent distress.  Eyes: Sclera anicteric.  ENT: Mask in place.  Neck: Supple without LAD/JVD/bruits.  Lungs: Clear to auscultation bilaterally, no wheezes/rales/rhonchi.  Cardiovascular: Regular rate and rhythm without murmurs, rubs or gallops.  Abdomen: Soft,  notender/nondistended. Positive bowel sounds. No tenderness over the graft.  Extremities: Without edema, joints without evidence of synovitis.  Skin: Without rash.  Neurological: Grossly nonfocal.  Psychiatric: Mood and affect appropriate.    Laboratory Results    Lab Results   Component Value Date    WBC 3.4 (L) 10/11/2021    RBC 2.52 (L) 10/11/2021    HGB 9.5 (L) 10/11/2021    HCT 26.9 (L) 10/11/2021    MCV 106.7 (H) 10/11/2021    MCH 37.7 (H) 10/11/2021    MCHC 35.3 10/11/2021    RDW 16.2 (H) 10/11/2021    PLT 346 10/11/2021    MPV 7.9 10/11/2021     Lab Results   Component Value Date    NA 139 10/11/2021    K 4.5 10/11/2021    CL 108 (H) 10/11/2021    CO2 24.6 10/11/2021    BUN 18 10/11/2021    CREATININE 1.59 (H) 10/11/2021    GLU 96 10/11/2021    CALCIUM 9.3 10/11/2021    ALBUMIN 4.0 10/11/2021    PHOS 2.9 10/11/2021     Lab Results   Component Value Date    A1C 4.9 10/11/2021     Lab Results   Component Value Date    ALKPHOS 44 (L) 10/11/2021    BILITOT 1.3 (H) 10/11/2021    BILIDIR 0.50 (H) 10/11/2021    PROT 7.2 10/11/2021    ALBUMIN 4.0 10/11/2021    ALT 15 10/11/2021    AST 18 10/11/2021     Lab Results   Component Value Date    CHOL 114 10/11/2021     Lab Results   Component Value Date    HDL 53 10/11/2021     Lab Results   Component Value Date    LDL 50 10/11/2021     Lab Results   Component Value Date    TRIG 56 10/11/2021     Lab Results   Component Value Date    PTH 112.5 (H) 10/11/2021         Assessment and Plan    1. Status post renal transplant. Her creatinine is 1.59 and within her most recent baseline. Envarsus level of 3.4 is a 30 hour trough. Her goal is 8-10 (due to pancreas transplant). She also had labs this am that would have a more accurate trough, that is pending. Will continue current immunosuppression for now. Discussed importance of regular labs. Had planned to convert her from azathioprine back to Myfortic, but given that she is contemplating pregnancy, will keep her on azathioprine for now.    2. Headaches/tremors. I am suspicious that her tacrolimus level may be running high but I don't have a recent accurate trough. Hopefully they drew one this am (she had labs at [facility]), will follow up on that. If they did not, she will need to repeat labs locally.    3. Childbearing age. She is not trying to prevent pregnancy, remains on azathioprine for now.    4. Status post pancreas transplant. She has had a mildly elevated amylase since transplant. Peaked at 467 on 05/27/19. Amylase today 10/11/21 is 153, lipase normal at 27. Hgb A1c reassuring at 4.9% today, C-peptide 2.36.    5. Anemia. Her Hgb has remained low, out of proportion to her renal function. Stable today at 9.5. Will get anemia labs with next blood draw. Suspect she may need some iron.    6. C.diff s/p repeat FMT 06/19/20. No current symptoms to suggest  recurrence.    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 New DSA to DR51 present 06/07/20 as well, at MFI 1330. Only C17 to MFI of 2268 on 03/02/21. After FMT, Myfortic increased to 180 mg TID. Now off of Myfortic and on azathioprine, DSAs pending from today.    8. CMV viremia. Valcyte stopped by IC[facility] 02/22/20. CMV PCR has remained negative, last 03/06/21. Pending from today.    9. History of hypertension. Blood pressure in clinic today 127/68.    10. Health maintenance. She received a flu shot 11/26/19. She has had a Pnemovax vaccine, needs Prevnar-20. She has received three doses of the Pfizer Covid vaccine.     We discussed that while we continue to recommend the COVID vaccine for all eligible patients, we know that kidney transplant patients do not respond as strongly due to their immunosuppression, so she should continue to follow the CDC guidelines for patients who are not vaccinated. I would also encourage all household members and close contacts that are eligible for vaccination to get vaccinated. We discussed the importance of ongoing social distancing, wearing a mask outside the home, and hand washing/sanitizing.     11. Will see patient back in 3 months, or sooner if needed.     I personally spent 44 minutes face-to-face and non-face-to-face in the care of this patient, which includes all pre, intra and post visit time on the date of service.

## 2021-10-11 ENCOUNTER — Ambulatory Visit: Admit: 2021-10-11 | Discharge: 2021-10-11 | Payer: MEDICARE

## 2021-10-11 ENCOUNTER — Ambulatory Visit: Admit: 2021-10-11 | Discharge: 2021-10-11 | Payer: MEDICARE | Attending: Nephrology | Primary: Nephrology

## 2021-10-11 DIAGNOSIS — Z940 Kidney transplant status: Principal | ICD-10-CM

## 2021-10-11 DIAGNOSIS — Z9483 Pancreas transplant status: Principal | ICD-10-CM

## 2021-10-11 DIAGNOSIS — Z79899 Other long term (current) drug therapy: Principal | ICD-10-CM

## 2021-10-11 DIAGNOSIS — E559 Vitamin D deficiency, unspecified: Principal | ICD-10-CM

## 2021-10-11 LAB — HEPATIC FUNCTION PANEL
ALBUMIN: 4 g/dL (ref 3.4–5.0)
ALKALINE PHOSPHATASE: 44 U/L — ABNORMAL LOW (ref 46–116)
ALT (SGPT): 15 U/L (ref 10–49)
AST (SGOT): 18 U/L (ref <=34)
BILIRUBIN DIRECT: 0.5 mg/dL — ABNORMAL HIGH (ref 0.00–0.30)
BILIRUBIN TOTAL: 1.3 mg/dL — ABNORMAL HIGH (ref 0.3–1.2)
PROTEIN TOTAL: 7.2 g/dL (ref 5.7–8.2)

## 2021-10-11 LAB — PROTEIN / CREATININE RATIO, URINE
CREATININE, URINE: 71.8 mg/dL
PROTEIN URINE: 8.9 mg/dL
PROTEIN/CREAT RATIO, URINE: 0.124

## 2021-10-11 LAB — URINALYSIS WITH MICROSCOPY
BILIRUBIN UA: NEGATIVE
BLOOD UA: NEGATIVE
GLUCOSE UA: NEGATIVE
KETONES UA: NEGATIVE
LEUKOCYTE ESTERASE UA: NEGATIVE
NITRITE UA: NEGATIVE
PH UA: 7.5 (ref 5.0–9.0)
PROTEIN UA: NEGATIVE
RBC UA: <1 /HPF (ref <4)
SPECIFIC GRAVITY UA: 1.01 (ref 1.005–1.030)
SQUAMOUS EPITHELIAL: 3 /HPF (ref 0–5)
UROBILINOGEN UA: 0.2
WBC UA: 4 /HPF (ref 0–5)

## 2021-10-11 LAB — CBC W/ AUTO DIFF
BASOPHILS ABSOLUTE COUNT: 0 10*9/L (ref 0.0–0.1)
BASOPHILS RELATIVE PERCENT: 0.7 %
EOSINOPHILS ABSOLUTE COUNT: 0.4 10*9/L (ref 0.0–0.5)
EOSINOPHILS RELATIVE PERCENT: 12.6 %
HEMATOCRIT: 26.9 % — ABNORMAL LOW (ref 34.0–44.0)
HEMOGLOBIN: 9.5 g/dL — ABNORMAL LOW (ref 11.3–14.9)
LYMPHOCYTES ABSOLUTE COUNT: 0.6 10*9/L — ABNORMAL LOW (ref 1.1–3.6)
LYMPHOCYTES RELATIVE PERCENT: 16.2 %
MEAN CORPUSCULAR HEMOGLOBIN CONC: 35.3 g/dL (ref 32.0–36.0)
MEAN CORPUSCULAR HEMOGLOBIN: 37.7 pg — ABNORMAL HIGH (ref 25.9–32.4)
MEAN CORPUSCULAR VOLUME: 106.7 fL — ABNORMAL HIGH (ref 77.6–95.7)
MEAN PLATELET VOLUME: 7.9 fL (ref 6.8–10.7)
MONOCYTES ABSOLUTE COUNT: 0.3 10*9/L (ref 0.3–0.8)
MONOCYTES RELATIVE PERCENT: 7.8 %
NEUTROPHILS ABSOLUTE COUNT: 2.2 10*9/L (ref 1.8–7.8)
NEUTROPHILS RELATIVE PERCENT: 62.7 %
NUCLEATED RED BLOOD CELLS: 0 /100{WBCs} (ref <=4)
PLATELET COUNT: 346 10*9/L (ref 150–450)
RED BLOOD CELL COUNT: 2.52 10*12/L — ABNORMAL LOW (ref 3.95–5.13)
RED CELL DISTRIBUTION WIDTH: 16.2 % — ABNORMAL HIGH (ref 12.2–15.2)
WBC ADJUSTED: 3.4 10*9/L — ABNORMAL LOW (ref 3.6–11.2)

## 2021-10-11 LAB — LIPASE: LIPASE: 27 U/L (ref 12–53)

## 2021-10-11 LAB — MAGNESIUM
MAGNESIUM: 1.8 mg/dL (ref 1.6–2.3)
MAGNESIUM: 1.8 mg/dL (ref 1.6–2.6)

## 2021-10-11 LAB — BASIC METABOLIC PANEL
ANION GAP: 6 mmol/L (ref 5–14)
BLOOD UREA NITROGEN: 19 mg/dL (ref 6–20)
BLOOD UREA NITROGEN: 18 mg/dL (ref 9–23)
BUN / CREAT RATIO: 12 (ref 9–23)
BUN / CREAT RATIO: 11
CALCIUM: 9.1 mg/dL (ref 8.7–10.2)
CALCIUM: 9.3 mg/dL (ref 8.7–10.4)
CHLORIDE: 105 mmol/L (ref 96–106)
CHLORIDE: 108 mmol/L — ABNORMAL HIGH (ref 98–107)
CO2: 19 mmol/L — ABNORMAL LOW (ref 20–29)
CO2: 24.6 mmol/L (ref 20.0–31.0)
CREATININE: 1.65 mg/dL — ABNORMAL HIGH (ref 0.57–1.00)
CREATININE: 1.59 mg/dL — ABNORMAL HIGH
EGFR CKD-EPI (2021) FEMALE: 44 mL/min/{1.73_m2} — ABNORMAL LOW (ref >=60)
EGFR: 42 mL/min/{1.73_m2} — ABNORMAL LOW
GLUCOSE RANDOM: 96 mg/dL (ref 70–179)
GLUCOSE: 86 mg/dL (ref 70–99)
POTASSIUM: 4.7 mmol/L (ref 3.5–5.2)
POTASSIUM: 4.5 mmol/L (ref 3.4–4.8)
SODIUM: 138 mmol/L (ref 134–144)
SODIUM: 139 mmol/L (ref 135–145)

## 2021-10-11 LAB — C-PEPTIDE: C-PEPTIDE: 2.36 ng/mL (ref 0.48–5.05)

## 2021-10-11 LAB — LIPID PANEL
CHOLESTEROL/HDL RATIO SCREEN: 2.2 (ref 1.0–4.5)
CHOLESTEROL: 114 mg/dL (ref <=200)
HDL CHOLESTEROL: 53 mg/dL (ref 40–60)
LDL CHOLESTEROL CALCULATED: 50 mg/dL (ref 40–99)
NON-HDL CHOLESTEROL: 61 mg/dL — ABNORMAL LOW (ref 70–130)
TRIGLYCERIDES: 56 mg/dL (ref 0–150)
VLDL CHOLESTEROL CAL: 11.2 mg/dL (ref 8–32)

## 2021-10-11 LAB — AMYLASE: AMYLASE: 153 U/L — ABNORMAL HIGH (ref 30–118)

## 2021-10-11 LAB — PARATHYROID HORMONE (PTH): PARATHYROID HORMONE INTACT: 112.5 pg/mL — ABNORMAL HIGH (ref 18.5–88.1)

## 2021-10-11 LAB — HEMOGLOBIN A1C
ESTIMATED AVERAGE GLUCOSE: 94 mg/dL
HEMOGLOBIN A1C: 4.9 % (ref 4.8–5.6)

## 2021-10-11 LAB — PHOSPHORUS
PHOSPHORUS, SERUM: 3.3 mg/dL (ref 3.0–4.3)
PHOSPHORUS: 2.9 mg/dL (ref 2.4–5.1)

## 2021-10-11 NOTE — Unmapped (Signed)
Transplant Coordinator, Clinic Visit   Pt seen today by transplant nephrology for follow up, reviewed medications and symptoms.          10/11/21 1225   BP: 127/68   Pulse: 84   Temp: 36.1 ??C (97 ??F)   Weight: 77.6 kg (171 lb)   Height: 165.1 cm (5' 5)   PainSc: 0-No pain     Patient will get labs through [facility] today.  She states she got them via [facility] also, but will obtain [facility] labs today for DSA'ss.    Assessment  BP: not checking at home  Lightheaded: no  Headache: every morning, resolves on it's own  Hand tremors: yes, moderate  Numbness/tingling: no  Fevers: no  Chills/sweats: no  Shortness of breath: no  Chest pain or pressure: no  Palpitations: no  Abdominal pain: no  Heart burn: no  Nausea/vomiting: no  Diarrhea/constipation: no  UTI symptoms: none  Swelling: no swelling   Sleep: sleeps well  Pain: none      Good appetite; reports adequate hydration.     Intake: reports adequate, 3 bottles of water daily  Output: adequate    Any new medications? none      Immunization status: up to date    Functional Score: 100   Normal no complaints; no evidence of  disease.   Employment status is: works full time    I spent a total of 20 minutes with Elaine Koch reviewing medications and symptoms.

## 2021-10-12 LAB — URINALYSIS
BILIRUBIN UA: NEGATIVE
BLOOD UA: NEGATIVE
GLUCOSE UA: NEGATIVE
KETONES UA: NEGATIVE
NITRITE UA: NEGATIVE
PH UA: 7.5 (ref 5.0–7.5)
PROTEIN UA: NEGATIVE
SPECIFIC GRAVITY UA: 1.012 (ref 1.005–1.030)
UROBILINOGEN UA: 0.2 mg/dL (ref 0.2–1.0)

## 2021-10-12 LAB — CBC W/ DIFFERENTIAL
BANDED NEUTROPHILS ABSOLUTE COUNT: 0 10*3/uL (ref 0.0–0.1)
BASOPHILS ABSOLUTE COUNT: 0 10*3/uL (ref 0.0–0.2)
BASOPHILS RELATIVE PERCENT: 1 %
EOSINOPHILS ABSOLUTE COUNT: 0.5 10*3/uL — ABNORMAL HIGH (ref 0.0–0.4)
EOSINOPHILS RELATIVE PERCENT: 14 %
HEMATOCRIT: 26.6 % — ABNORMAL LOW (ref 34.0–46.6)
HEMOGLOBIN: 9.1 g/dL — ABNORMAL LOW (ref 11.1–15.9)
IMMATURE GRANULOCYTES: 0 %
LYMPHOCYTES ABSOLUTE COUNT: 0.4 10*3/uL — ABNORMAL LOW (ref 0.7–3.1)
LYMPHOCYTES RELATIVE PERCENT: 12 %
MEAN CORPUSCULAR HEMOGLOBIN CONC: 34.2 g/dL (ref 31.5–35.7)
MEAN CORPUSCULAR HEMOGLOBIN: 36.8 pg — ABNORMAL HIGH (ref 26.6–33.0)
MEAN CORPUSCULAR VOLUME: 108 fL — ABNORMAL HIGH (ref 79–97)
MONOCYTES ABSOLUTE COUNT: 0.3 10*3/uL (ref 0.1–0.9)
MONOCYTES RELATIVE PERCENT: 9 %
NEUTROPHILS ABSOLUTE COUNT: 2 10*3/uL (ref 1.4–7.0)
NEUTROPHILS RELATIVE PERCENT: 64 %
PLATELET COUNT: 343 10*3/uL (ref 150–450)
RED BLOOD CELL COUNT: 2.47 x10E6/uL — CL (ref 3.77–5.28)
RED CELL DISTRIBUTION WIDTH: 14.6 % (ref 11.7–15.4)
WHITE BLOOD CELL COUNT: 3.2 10*3/uL — ABNORMAL LOW (ref 3.4–10.8)

## 2021-10-12 LAB — MICROSCOPIC EXAMINATION
BACTERIA: NONE SEEN
CASTS: NONE SEEN /LPF
RBC URINE: NONE SEEN /HPF (ref 0–2)

## 2021-10-12 LAB — TACROLIMUS LEVEL, TROUGH: TACROLIMUS, TROUGH: 3.4 ng/mL — ABNORMAL LOW (ref 5.0–15.0)

## 2021-10-13 LAB — CMV DNA, QUANTITATIVE, PCR: CMV VIRAL LD: NOT DETECTED

## 2021-10-13 LAB — TACROLIMUS LEVEL: TACROLIMUS BLOOD: 4.8 ng/mL (ref 2.0–20.0)

## 2021-10-15 DIAGNOSIS — Z940 Kidney transplant status: Principal | ICD-10-CM

## 2021-10-16 DIAGNOSIS — Z940 Kidney transplant status: Principal | ICD-10-CM

## 2021-10-16 DIAGNOSIS — D649 Anemia, unspecified: Principal | ICD-10-CM

## 2021-10-16 LAB — HLA DS POST TRANSPLANT
ANTI-DONOR DRW #2 MFI: 728 MFI
ANTI-DONOR HLA-A #1 MFI: 103 MFI
ANTI-DONOR HLA-A #2 MFI: 0 MFI
ANTI-DONOR HLA-B #1 MFI: 30 MFI
ANTI-DONOR HLA-B #2 MFI: 770 MFI
ANTI-DONOR HLA-C #2 MFI: 3268 MFI — ABNORMAL HIGH
ANTI-DONOR HLA-DP AG #1 MFI: 186 MFI
ANTI-DONOR HLA-DQB #1 MFI: 0 MFI
ANTI-DONOR HLA-DQB #2 MFI: 248 MFI
ANTI-DONOR HLA-DR #1 MFI: 104 MFI
ANTI-DONOR HLA-DR #2 MFI: 157 MFI

## 2021-10-16 LAB — FSAB CLASS 1 ANTIBODY SPECIFICITY: HLA CLASS 1 ANTIBODY RESULT: POSITIVE

## 2021-10-16 LAB — FSAB CLASS 2 ANTIBODY SPECIFICITY: HLA CL2 AB RESULT: NEGATIVE

## 2021-10-16 NOTE — Unmapped (Signed)
error 

## 2021-10-16 NOTE — Unmapped (Signed)
Reviewed recent labs and tacrolimus level with Dr. Carlene Coria, pt advised to increase envarsus dose to 20 mg daily  (from 18 mg daily)  However, pt reports she is currently only taking 16 mg daily, therefore pt advised to increase envarsus dose to 18 mg daily.  Pt agreed to repeat labs in 1-2 week and will also check iron studies and ferritin.

## 2021-10-17 LAB — VITAMIN D 1,25 DIHYDROXY: VITAMIN D 1,25-DIHYDROXY: 34 pg/mL

## 2021-10-17 LAB — EBV QUANTITATIVE PCR, BLOOD: EBV VIRAL LOAD RESULT: NOT DETECTED

## 2021-10-18 LAB — HIV ANTIGEN/ANTIBODY COMBO: HIV ANTIGEN/ANTIBODY COMBO: NONREACTIVE

## 2021-10-20 LAB — VITAMIN D 25 HYDROXY: VITAMIN D, TOTAL (25OH): 43.7 ng/mL (ref 20.0–80.0)

## 2021-10-24 MED ORDER — SODIUM BICARBONATE 650 MG TABLET
ORAL_TABLET | Freq: Three times a day (TID) | ORAL | 3 refills | 30 days | Status: CP
Start: 2021-10-24 — End: 2022-10-24
  Filled 2021-10-25: qty 180, 30d supply, fill #0

## 2021-10-24 NOTE — Unmapped (Signed)
duplicate

## 2021-10-24 NOTE — Unmapped (Signed)
[facility] Clinical Assessment & Refill Coordination Note    Elaine Koch, [place]: 11/03/1985  Phone: [phone] (home)     All above HIPAA information was verified with patient.     Was a Nurse, learning disability used for this call? 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 aspirin (ECOTRIN) 81 MG tablet Take 1 tablet (81 mg total) by mouth daily. 30 tablet 11   ??? azaTHIOprine (IMURAN) 50 mg tablet Take 2 tablets (100 mg total) by mouth daily 60 tablet 11   ??? magnesium oxide (MAG-OX) 400 mg (241.3 mg elemental magnesium) tablet Take 1 tablet (400 mg total) by mouth daily. 30 tablet 11   ??? metroNIDAZOLE (METROCREAM) 0.75 % cream Apply topically Two (2) times a day. 45 g 0   ??? multivitamin, prenatal, folic acid-iron, 29 mg iron- 1 mg Take 1 tablet by mouth daily. 90 tablet 5   ??? NIFEdipine (PROCARDIA XL) 30 MG 24 hr tablet Take 1 tablet (30 mg total) by mouth daily. 90 tablet 3   ??? sodium bicarbonate 650 mg tablet Take 2 tablets (1,300 mg total) by mouth Three (3) times a day. 180 tablet 3   ??? tacrolimus (ENVARSUS XR) 1 mg Tb24 extended release tablet Take 2 tablets (2 mg total) by mouth daily. Take  2 tablets ( 2 mg total) in addition to 4 tablets ( 16 mg total)  for a total daily dose of 18 mg daily 60 tablet 11   ??? tacrolimus (ENVARSUS XR) 1 mg Tb24 extended release tablet Take 2 tablets (2 mg total) by mouth in the morning. Take FOUR 4 mg tablets (16 mg total) and TWO 1 mg tablet (2 mg total) for a total dose of 18 mg daily. 60 tablet 11   ??? tacrolimus (ENVARSUS XR) 4 mg Tb24 extended release tablet Take 4 tablets (16 mg total) by mouth daily. Take FOUR 4 mg tablets (16 mg total) and TWO 1 mg tablet (2 mg total) for a total dose of 18 mg daily 120 tablet 11     No current facility-administered medications for this visit.        Changes to medications: Destany reports no changes at this time.    Allergies   Allergen Reactions   ??? Iodinated Contrast Media Other (See Comments), Rash and Swelling     Burning, warmth throughout body and tingling.  Throat swelling.  Treated with benadryl and symptoms improved.  Has not had contrast since then (2013 or 2014).    Burning   ??? Nickel Rash   ??? Propranolol Swelling and Other (See Comments)   ??? Eye Irrigating Solution [Ophthalmic Irrigation Solution] Other (See Comments)     Contrast dye (name?) used in eyes caused hot feeling in face, reversed with Benadryl.    ??? Iodine      Other reaction(s): Skin Rash   ??? Naltrexone Rash   ??? Uni-Cortrom Rash       Changes to allergies: No    SPECIALTY MEDICATION ADHERENCE     envarsus 1mg   : 7 days of medicine on hand   envarsus 4mg   : 7 days of medicine on hand   Azathioprine 50mg   : 5 days of medicine on hand       Medication Adherence    Patient reported X missed doses in the last month:  0  Specialty Medication: azathioprine 50mg   Patient is on additional specialty medications: Yes  Additional Specialty Medications: Envarsus 1mg   Patient Reported Additional Medication X Missed Doses in the Last Month: 0  Patient is on more than two specialty medications: Yes  Specialty Medication: envarsus 4mg   Patient Reported Additional Medication X Missed Doses in the Last Month: 0                      Specialty medication(s) dose(s) confirmed: Regimen is correct and unchanged.     Are there any concerns with adherence? No    Adherence counseling provided? Not needed    CLINICAL MANAGEMENT AND INTERVENTION      Clinical Benefit Assessment:    Do you feel the medicine is effective or helping your condition? Yes    Clinical Benefit counseling provided? Not needed    Adverse Effects Assessment:    Are you experiencing any side effects? No    Are you experiencing difficulty administering your medicine? No    Quality of Life Assessment:         How many days over the past month did your transplant  keep you from your normal activities? For example, brushing your teeth or getting up in the morning. 0    Have you discussed this with your provider? Not needed    Acute Infection Status:    Acute infections noted within Epic:  No active infections  Patient reported infection: None    Therapy Appropriateness:    Is therapy appropriate and patient progressing towards therapeutic goals? Yes, therapy is appropriate and should be continued    DISEASE/MEDICATION-SPECIFIC INFORMATION      N/A    PATIENT SPECIFIC NEEDS     - Does the patient have any physical, cognitive, or cultural barriers? No    - Is the patient high risk? No    - Does the patient require a Care Management Plan? No     SOCIAL DETERMINANTS OF HEALTH     At the [facility], we have learned that life circumstances - like trouble affording food, housing, utilities, or transportation can affect the health of many of our patients.   That is why we wanted to ask: are you currently experiencing any life circumstances that are negatively impacting your health and/or quality of life? No    Social Determinants of Health     Financial Resource Strain: Medium Risk (03/30/2018)    Overall Financial Resource Strain (CARDIA)    ??? Difficulty of Paying Living Expenses: Somewhat hard   Internet Connectivity: Not on file   Food Insecurity: Not on file   Tobacco Use: Low Risk  (10/11/2021)    Patient History    ??? Smoking Tobacco Use: Never    ??? Smokeless Tobacco Use: Never    ??? Passive Exposure: Not on file   Housing/Utilities: Not on file   Alcohol Use: Not on file   Transportation Needs: Not on file   Substance Use: Not on file   Health Literacy: Not on file   Physical Activity: Insufficiently Active (04/20/2019)    Exercise Vital Sign    ??? Days of Exercise per Week: 2 days    ??? Minutes of Exercise per Session: 30 min   Interpersonal Safety: Not on file   Stress: No Stress Concern Present (04/20/2019)    [employer] of Occupational Health - Occupational Stress Questionnaire    ??? Feeling of Stress : Only a little  Intimate Partner Violence: Unknown (04/20/2019)    Humiliation, Afraid, Rape, and Kick questionnaire    ??? Fear of Current or Ex-Partner: Patient refused    ??? Emotionally Abused: Patient refused    ??? Physically Abused: Patient refused    ??? Sexually Abused: Patient refused   Depression: Not on file   Social Connections: Unknown (04/20/2019)    Social Connection and Isolation Panel [NHANES]    ??? Frequency of Communication with Friends and Family: Patient refused    ??? Frequency of Social Gatherings with Friends and Family: Patient refused    ??? Attends Religious Services: Patient refused    ??? Active Member of Clubs or Organizations: Patient refused    ??? Attends Banker Meetings: Patient refused    ??? Marital Status: Patient refused       Would you be willing to receive help with any of the needs that you have identified today? Not applicable       SHIPPING     Specialty Medication(s) to be Shipped:   Transplant: Envarsus 1mg , Envarsus 4mg  and azathioprine 50mg     Other medication(s) to be shipped: sodium bicarb, aspirin     Changes to insurance: No    Delivery Scheduled: Yes, Expected medication delivery date: 10/26/2021.  However, Rx request for refills was sent to the provider as there are none remaining.     Medication will be delivered via UPS to the confirmed prescription address in [facility].    The patient will receive a drug information handout for each medication shipped and additional FDA Medication Guides as required.  Verified that patient has previously received a Conservation officer, historic buildings and a Surveyor, mining.    The patient or caregiver noted above participated in the development of this care plan and knows that they can request review of or adjustments to the care plan at any time.      All of the patient's questions and concerns have been addressed.    Thad Ranger   [facility] Specialty Pharmacist

## 2021-10-24 NOTE — Unmapped (Signed)
Pt request for RX Refill

## 2021-10-25 MED FILL — ASPIRIN 81 MG TABLET,DELAYED RELEASE: ORAL | 30 days supply | Qty: 30 | Fill #5

## 2021-10-25 MED FILL — ENVARSUS XR 1 MG TABLET,EXTENDED RELEASE: ORAL | 30 days supply | Qty: 60 | Fill #4

## 2021-10-25 MED FILL — AZATHIOPRINE 50 MG TABLET: ORAL | 30 days supply | Qty: 60 | Fill #9

## 2021-10-25 MED FILL — ENVARSUS XR 4 MG TABLET,EXTENDED RELEASE: ORAL | 30 days supply | Qty: 120 | Fill #4

## 2021-10-29 DIAGNOSIS — Z940 Kidney transplant status: Principal | ICD-10-CM

## 2021-11-12 DIAGNOSIS — Z940 Kidney transplant status: Principal | ICD-10-CM

## 2021-11-19 NOTE — Unmapped (Signed)
[facility] Refill Coordination Note    Specialty Medication(s) to be Shipped:   Transplant: Envarsus 1mg , Envarsus 4mg , and azathioprine 50 mg    Other medication(s) to be shipped:  aspirin and sodium      Elaine Koch, DOB: 10-13-1985  Phone: [phone] (home)       All above HIPAA information was verified with patient.     Was a Nurse, learning disability used for this call? No    Completed refill call assessment today to schedule patient's medication shipment from the [facility] [phone]).  All relevant notes have been reviewed.     Specialty medication(s) and dose(s) confirmed: Regimen is correct and unchanged.   Changes to medications: Paloma reports no changes at this time.  Changes to insurance: No  New side effects reported not previously addressed with a pharmacist or physician: None reported  Questions for the pharmacist: No    Confirmed patient received a Conservation officer, historic buildings and a Surveyor, mining with first shipment. The patient will receive a drug information handout for each medication shipped and additional FDA Medication Guides as required.       DISEASE/MEDICATION-SPECIFIC INFORMATION        N/A    SPECIALTY MEDICATION ADHERENCE     Medication Adherence    Patient reported X missed doses in the last month: 0  Specialty Medication: azathioprine 50 mg  Patient is on additional specialty medications: Yes  Additional Specialty Medications: Envarsus 1 mg   Patient Reported Additional Medication X Missed Doses in the Last Month: 0  Patient is on more than two specialty medications: Yes  Specialty Medication: Envarsus 4 mg  Patient Reported Additional Medication X Missed Doses in the Last Month: 0                                Were doses missed due to medication being on hold? No    Envarsus 1 mg: 12 days of medicine on hand   Envarsus 4 mg: 12 days of medicine on hand   Azathioprine  50 mg: 12 days of medicine on hand       REFERRAL TO PHARMACIST     Referral to the pharmacist: Not needed      SHIPPING     Shipping address confirmed in Epic.     Delivery Scheduled: Yes, Expected medication delivery date: 11/26/21.     Medication will be delivered via UPS to the prescription address in Epic W[facility].    [name]   [facility] Specialty Technician

## 2021-11-23 MED FILL — ENVARSUS XR 1 MG TABLET,EXTENDED RELEASE: ORAL | 30 days supply | Qty: 60 | Fill #5

## 2021-11-23 MED FILL — ASPIRIN 81 MG TABLET,DELAYED RELEASE: ORAL | 30 days supply | Qty: 30 | Fill #6

## 2021-11-23 MED FILL — ENVARSUS XR 4 MG TABLET,EXTENDED RELEASE: ORAL | 30 days supply | Qty: 120 | Fill #5

## 2021-11-23 MED FILL — SODIUM BICARBONATE 650 MG TABLET: ORAL | 30 days supply | Qty: 180 | Fill #1

## 2021-11-23 MED FILL — AZATHIOPRINE 50 MG TABLET: ORAL | 30 days supply | Qty: 60 | Fill #10

## 2021-11-26 DIAGNOSIS — Z940 Kidney transplant status: Principal | ICD-10-CM

## 2021-11-27 NOTE — Unmapped (Signed)
Rec'd call from pt asking if she can take cough medicine, pt has a dry cough lasting about a week now. She said she did go to urgent care last Thursday and they didn't give her anything. Negative covid. She said the urgent care told her she can take antihistamines.  I let pt know robitussin cough medicine, cough drops, plenty of water is all ok. She voiced understanding and agreed with plan.

## 2021-11-30 MED ORDER — BENZONATATE 100 MG CAPSULE
ORAL_CAPSULE | Freq: Three times a day (TID) | ORAL | 0 refills | 7 days | Status: CP | PRN
Start: 2021-11-30 — End: 2021-12-07

## 2021-12-10 DIAGNOSIS — Z940 Kidney transplant status: Principal | ICD-10-CM

## 2021-12-12 DIAGNOSIS — Z79899 Other long term (current) drug therapy: Principal | ICD-10-CM

## 2021-12-12 DIAGNOSIS — Z940 Kidney transplant status: Principal | ICD-10-CM

## 2021-12-18 NOTE — Unmapped (Signed)
[facility] Refill Coordination Note    Specialty Medication(s) to be Shipped:   Transplant: Envarsus 1mg , Envarsus 4mg , and azathioprine     Other medication(s) to be shipped:  sodium and aspirin      Elaine Koch, DOB: Jun 18, 1985  Phone: [phone] (home)       All above HIPAA information was verified with patient.     Was a Nurse, learning disability used for this call? No    Completed refill call assessment today to schedule patient's medication shipment from the [facility] [phone]).  All relevant notes have been reviewed.     Specialty medication(s) and dose(s) confirmed: Regimen is correct and unchanged.   Changes to medications: Coline reports no changes at this time.  Changes to insurance: No  New side effects reported not previously addressed with a pharmacist or physician: None reported  Questions for the pharmacist: No    Confirmed patient received a Conservation officer, historic buildings and a Surveyor, mining with first shipment. The patient will receive a drug information handout for each medication shipped and additional FDA Medication Guides as required.       DISEASE/MEDICATION-SPECIFIC INFORMATION        N/A    SPECIALTY MEDICATION ADHERENCE     Medication Adherence    Patient reported X missed doses in the last month: 0  Specialty Medication: azathioprine 50 mg tablet (IMURAN)  Patient is on additional specialty medications: Yes  Additional Specialty Medications: ENVARSUS XR 1 mg Tb24 extended release tablet (tacrolimus)  Patient Reported Additional Medication X Missed Doses in the Last Month: 0  Patient is on more than two specialty medications: Yes  Specialty Medication: ENVARSUS XR 4 mg Tb24 extended release tablet (tacrolimus)  Patient Reported Additional Medication X Missed Doses in the Last Month: 0                                Were doses missed due to medication being on hold? No    azathioprine 50 mg: 7 days of medicine on hand   Envarsus 1 mg: 7 days of medicine on hand   Envarsus 4 mg: 7 days of medicine on hand       REFERRAL TO PHARMACIST     Referral to the pharmacist: Not needed      [facility]     Shipping address confirmed in Epic.     Delivery Scheduled: Yes, Expected medication delivery date: 12/21/21.     Medication will be delivered via UPS to the prescription address in Epic WAM.    [name]   [facility] Specialty Technician

## 2021-12-20 MED FILL — AZATHIOPRINE 50 MG TABLET: ORAL | 30 days supply | Qty: 60 | Fill #11

## 2021-12-20 MED FILL — SODIUM BICARBONATE 650 MG TABLET: ORAL | 30 days supply | Qty: 180 | Fill #2

## 2021-12-20 MED FILL — ENVARSUS XR 1 MG TABLET,EXTENDED RELEASE: ORAL | 30 days supply | Qty: 60 | Fill #6

## 2021-12-20 MED FILL — ASPIRIN 81 MG TABLET,DELAYED RELEASE: ORAL | 30 days supply | Qty: 30 | Fill #7

## 2021-12-20 MED FILL — ENVARSUS XR 4 MG TABLET,EXTENDED RELEASE: ORAL | 30 days supply | Qty: 120 | Fill #6

## 2021-12-24 DIAGNOSIS — Z940 Kidney transplant status: Principal | ICD-10-CM

## 2022-01-01 DIAGNOSIS — Z79899 Other long term (current) drug therapy: Principal | ICD-10-CM

## 2022-01-01 DIAGNOSIS — Z940 Kidney transplant status: Principal | ICD-10-CM

## 2022-01-01 DIAGNOSIS — E119 Type 2 diabetes mellitus without complications: Principal | ICD-10-CM

## 2022-01-01 DIAGNOSIS — Z9483 Pancreas transplant status: Principal | ICD-10-CM

## 2022-01-07 DIAGNOSIS — Z940 Kidney transplant status: Principal | ICD-10-CM

## 2022-01-07 DIAGNOSIS — Z79899 Other long term (current) drug therapy: Principal | ICD-10-CM

## 2022-01-09 LAB — RENAL FUNCTION PANEL
ALBUMIN: 4 g/dL (ref 3.9–4.9)
BLOOD UREA NITROGEN: 24 mg/dL — ABNORMAL HIGH (ref 6–20)
BUN / CREAT RATIO: 14 (ref 9–23)
CALCIUM: 8.9 mg/dL (ref 8.7–10.2)
CHLORIDE: 108 mmol/L — ABNORMAL HIGH (ref 96–106)
CO2: 20 mmol/L (ref 20–29)
CREATININE: 1.74 mg/dL — ABNORMAL HIGH (ref 0.57–1.00)
EGFR: 39 mL/min/{1.73_m2} — ABNORMAL LOW
GLUCOSE: 93 mg/dL (ref 70–99)
PHOSPHORUS, SERUM: 3.7 mg/dL (ref 3.0–4.3)
POTASSIUM: 4.9 mmol/L (ref 3.5–5.2)
SODIUM: 140 mmol/L (ref 134–144)

## 2022-01-09 LAB — MAGNESIUM: MAGNESIUM: 1.7 mg/dL (ref 1.6–2.3)

## 2022-01-10 DIAGNOSIS — Z940 Kidney transplant status: Principal | ICD-10-CM

## 2022-01-10 LAB — CBC W/ DIFFERENTIAL
BANDED NEUTROPHILS ABSOLUTE COUNT: 0 10*3/uL (ref 0.0–0.1)
BASOPHILS ABSOLUTE COUNT: 0 10*3/uL (ref 0.0–0.2)
BASOPHILS RELATIVE PERCENT: 1 %
EOSINOPHILS ABSOLUTE COUNT: 0.3 10*3/uL (ref 0.0–0.4)
EOSINOPHILS RELATIVE PERCENT: 14 %
HEMATOCRIT: 23.4 % — ABNORMAL LOW (ref 34.0–46.6)
HEMOGLOBIN: 8 g/dL — ABNORMAL LOW (ref 11.1–15.9)
IMMATURE GRANULOCYTES: 1 %
LYMPHOCYTES ABSOLUTE COUNT: 0.4 10*3/uL — ABNORMAL LOW (ref 0.7–3.1)
LYMPHOCYTES RELATIVE PERCENT: 19 %
MEAN CORPUSCULAR HEMOGLOBIN CONC: 34.2 g/dL (ref 31.5–35.7)
MEAN CORPUSCULAR HEMOGLOBIN: 37.2 pg — ABNORMAL HIGH (ref 26.6–33.0)
MEAN CORPUSCULAR VOLUME: 109 fL — ABNORMAL HIGH (ref 79–97)
MONOCYTES ABSOLUTE COUNT: 0.2 10*3/uL (ref 0.1–0.9)
MONOCYTES RELATIVE PERCENT: 12 %
NEUTROPHILS ABSOLUTE COUNT: 1 10*3/uL — ABNORMAL LOW (ref 1.4–7.0)
NEUTROPHILS RELATIVE PERCENT: 53 %
PLATELET COUNT: 242 10*3/uL (ref 150–450)
RED BLOOD CELL COUNT: 2.15 x10E6/uL — CL (ref 3.77–5.28)
RED CELL DISTRIBUTION WIDTH: 15.4 % (ref 11.7–15.4)
WHITE BLOOD CELL COUNT: 1.9 10*3/uL — CL (ref 3.4–10.8)

## 2022-01-10 LAB — MICROSCOPIC EXAMINATION
CASTS: NONE SEEN /LPF
RBC URINE: NONE SEEN /HPF (ref 0–2)

## 2022-01-10 LAB — URINALYSIS
BILIRUBIN UA: NEGATIVE
BLOOD UA: NEGATIVE
GLUCOSE UA: NEGATIVE
KETONES UA: NEGATIVE
NITRITE UA: NEGATIVE
PH UA: 7 (ref 5.0–7.5)
PROTEIN UA: NEGATIVE
SPECIFIC GRAVITY UA: 1.013 (ref 1.005–1.030)
UROBILINOGEN UA: 0.2 mg/dL (ref 0.2–1.0)

## 2022-01-11 DIAGNOSIS — D708 Other neutropenia: Principal | ICD-10-CM

## 2022-01-11 LAB — TACROLIMUS LEVEL: TACROLIMUS BLOOD: 3.3 ng/mL (ref 2.0–20.0)

## 2022-01-11 NOTE — Unmapped (Signed)
Pt sent mychart message yesterday to review recent labs and advise she will need a granix injection today for her appt.  Pt subsequently cancelled her appt stating she was too sick to come to clinic.  Attempted tp call pt to re-schedule her appt for today and advise she will need granix for her leukopenia, unable to leave a VM.  Will send another my chart message to patient in attempt to schedule her with Dr. Carlene Coria.

## 2022-01-16 MED ORDER — AZATHIOPRINE 50 MG TABLET
ORAL_TABLET | Freq: Every day | ORAL | 11 refills | 30 days
Start: 2022-01-16 — End: 2023-01-16

## 2022-01-16 NOTE — Unmapped (Signed)
Pt called to follow up on recent labs.  Reviewed with Dr. Jacqualin Combes and pt advised she needs a nurse visit for granix.  Pt agreeable to come next Thursday 12/14 for a nurse visit at 3 pm.  Pt scheduled and requested to please get labs the following week to follow up on leukopenia.

## 2022-01-16 NOTE — Unmapped (Signed)
Error

## 2022-01-17 MED ORDER — AZATHIOPRINE 50 MG TABLET
ORAL_TABLET | Freq: Every day | ORAL | 11 refills | 30 days | Status: CP
Start: 2022-01-17 — End: 2023-01-17
  Filled 2022-01-18: qty 60, 30d supply, fill #0

## 2022-01-17 NOTE — Unmapped (Signed)
[facility] Refill Coordination Note    Specialty Medication(s) to be Shipped:   Transplant: Envarsus 1mg , Envarsus 4mg , and azathioprine     Other medication(s) to be shipped:   aspirin      Elaine Koch, DOB: Apr 20, 1985  Phone: [phone] (home)       All above HIPAA information was verified with patient.     Was a Nurse, learning disability used for this call? No    Completed refill call assessment today to schedule patient's medication shipment from the [facility] [phone]).  All relevant notes have been reviewed.     Specialty medication(s) and dose(s) confirmed: Regimen is correct and unchanged.   Changes to medications: [name] reports no changes at this time.  Changes to insurance: No  New side effects reported not previously addressed with a pharmacist or physician: None reported  Questions for the pharmacist: No    Confirmed patient received a Conservation officer, historic buildings and a Surveyor, mining with first shipment. The patient will receive a drug information handout for each medication shipped and additional FDA Medication Guides as required.       DISEASE/MEDICATION-SPECIFIC INFORMATION        N/A    SPECIALTY MEDICATION ADHERENCE     Medication Adherence    Patient reported X missed doses in the last month: 0  Specialty Medication: ENVARSUS XR 1 mg Tb24 extended release tablet (tacrolimus)  Patient is on additional specialty medications: Yes  Additional Specialty Medications: ENVARSUS XR 4 mg Tb24 extended release tablet (tacrolimus)  Patient Reported Additional Medication X Missed Doses in the Last Month: 0  Patient is on more than two specialty medications: Yes  Specialty Medication: azathioprine 50 mg tablet (IMURAN)  Patient Reported Additional Medication X Missed Doses in the Last Month: 0                                Were doses missed due to medication being on hold? No    azathioprine 50 mg: 4 days of medicine on hand   Envarsus 1 mg: 4 days of medicine on hand Envarsus 4 mg: 4 days of medicine on hand       REFERRAL TO PHARMACIST     Referral to the pharmacist: Not needed      [facility]     Shipping address confirmed in Epic.     Delivery Scheduled: Yes, Expected medication delivery date: 01/21/22.     Medication will be delivered via UPS to the prescription address in Epic WAM.    Elaine Koch   [facility] Specialty Technician

## 2022-01-18 MED FILL — ENVARSUS XR 4 MG TABLET,EXTENDED RELEASE: ORAL | 30 days supply | Qty: 120 | Fill #7

## 2022-01-18 MED FILL — ENVARSUS XR 1 MG TABLET,EXTENDED RELEASE: ORAL | 30 days supply | Qty: 60 | Fill #7

## 2022-01-18 MED FILL — ASPIRIN 81 MG TABLET,DELAYED RELEASE: ORAL | 30 days supply | Qty: 30 | Fill #8

## 2022-01-20 NOTE — Unmapped (Signed)
Patient called on call nurse to ask if she can come to ER and get granix injection today.  I explained I have no idea.  She requested that I cal the ER and see if they could give her granix. Called ER, spoke with charge RN Judeth Cornfield and she stated that she was unsure.  She knows it's not something they have in their pyxis but is unsure if [facility] can send it.  I called the patient back and explained this to her.  She is unsure if she is going to come to the ER at this time.

## 2022-01-21 DIAGNOSIS — Z940 Kidney transplant status: Principal | ICD-10-CM

## 2022-01-24 ENCOUNTER — Institutional Professional Consult (permissible substitution): Admit: 2022-01-24 | Discharge: 2022-01-25 | Payer: MEDICARE

## 2022-01-24 DIAGNOSIS — D708 Other neutropenia: Principal | ICD-10-CM

## 2022-01-24 MED ADMIN — filgrastim-aafi (NIVESTYM) injection syringe 300 mcg: 300 ug | SUBCUTANEOUS | @ 21:00:00 | Stop: 2022-01-24

## 2022-01-24 NOTE — Unmapped (Addendum)
Patient received of nivestym to left arm subcutaneous. Patient tolerated without difficulty.

## 2022-02-01 ENCOUNTER — Ambulatory Visit: Admit: 2022-02-01 | Discharge: 2022-02-01 | Disposition: A | Discharge status: Home / Self Care | Payer: MEDICARE

## 2022-02-01 ENCOUNTER — Emergency Department: Admit: 2022-02-01 | Discharge: 2022-02-01 | Disposition: A | Discharge status: Home / Self Care | Payer: MEDICARE

## 2022-02-01 DIAGNOSIS — R110 Nausea: Principal | ICD-10-CM

## 2022-02-01 DIAGNOSIS — N390 Urinary tract infection, site not specified: Principal | ICD-10-CM

## 2022-02-01 LAB — URINALYSIS WITH MICROSCOPY WITH CULTURE REFLEX
BACTERIA: NONE SEEN /HPF
BILIRUBIN UA: NEGATIVE
BLOOD UA: NEGATIVE
GLUCOSE UA: NEGATIVE
KETONES UA: NEGATIVE
NITRITE UA: NEGATIVE
PH UA: 7 (ref 5.0–9.0)
PROTEIN UA: NEGATIVE
RBC UA: <1 /HPF (ref <=4)
SPECIFIC GRAVITY UA: 1.013 (ref 1.003–1.030)
SQUAMOUS EPITHELIAL: 4 /HPF (ref 0–5)
UROBILINOGEN UA: 2 — AB
WBC UA: 9 /HPF — ABNORMAL HIGH (ref 0–5)

## 2022-02-01 LAB — COMPREHENSIVE METABOLIC PANEL
ALBUMIN: 3.9 g/dL (ref 3.4–5.0)
ALKALINE PHOSPHATASE: 54 U/L (ref 46–116)
ALT (SGPT): 8 U/L — ABNORMAL LOW (ref 10–49)
ANION GAP: 6 mmol/L (ref 5–14)
AST (SGOT): 18 U/L (ref <=34)
BILIRUBIN TOTAL: 1.1 mg/dL (ref 0.3–1.2)
BLOOD UREA NITROGEN: 21 mg/dL (ref 9–23)
BUN / CREAT RATIO: 11
CALCIUM: 9.5 mg/dL (ref 8.7–10.4)
CHLORIDE: 110 mmol/L — ABNORMAL HIGH (ref 98–107)
CO2: 25 mmol/L (ref 20.0–31.0)
CREATININE: 1.87 mg/dL — ABNORMAL HIGH
EGFR CKD-EPI (2021) FEMALE: 36 mL/min/{1.73_m2} — ABNORMAL LOW (ref >=60)
GLUCOSE RANDOM: 91 mg/dL (ref 70–179)
POTASSIUM: 4.4 mmol/L (ref 3.4–4.8)
PROTEIN TOTAL: 7.8 g/dL (ref 5.7–8.2)
SODIUM: 141 mmol/L (ref 135–145)

## 2022-02-01 LAB — PREGNANCY, URINE: PREGNANCY TEST URINE: NEGATIVE

## 2022-02-01 LAB — CBC W/ AUTO DIFF
BASOPHILS ABSOLUTE COUNT: 0 10*9/L (ref 0.0–0.1)
BASOPHILS RELATIVE PERCENT: 0.9 %
EOSINOPHILS ABSOLUTE COUNT: 0.5 10*9/L (ref 0.0–0.5)
EOSINOPHILS RELATIVE PERCENT: 18 %
HEMATOCRIT: 27.5 % — ABNORMAL LOW (ref 34.0–44.0)
HEMOGLOBIN: 9.6 g/dL — ABNORMAL LOW (ref 11.3–14.9)
LYMPHOCYTES ABSOLUTE COUNT: 0.4 10*9/L — ABNORMAL LOW (ref 1.1–3.6)
LYMPHOCYTES RELATIVE PERCENT: 12.6 %
MEAN CORPUSCULAR HEMOGLOBIN CONC: 34.8 g/dL (ref 32.0–36.0)
MEAN CORPUSCULAR HEMOGLOBIN: 37.9 pg — ABNORMAL HIGH (ref 25.9–32.4)
MEAN CORPUSCULAR VOLUME: 108.9 fL — ABNORMAL HIGH (ref 77.6–95.7)
MEAN PLATELET VOLUME: 8.4 fL (ref 6.8–10.7)
MONOCYTES ABSOLUTE COUNT: 0.2 10*9/L — ABNORMAL LOW (ref 0.3–0.8)
MONOCYTES RELATIVE PERCENT: 7.8 %
NEUTROPHILS ABSOLUTE COUNT: 1.9 10*9/L (ref 1.8–7.8)
NEUTROPHILS RELATIVE PERCENT: 60.7 %
PLATELET COUNT: 220 10*9/L (ref 150–450)
RED BLOOD CELL COUNT: 2.52 10*12/L — ABNORMAL LOW (ref 3.95–5.13)
RED CELL DISTRIBUTION WIDTH: 14.8 % (ref 12.2–15.2)
WBC ADJUSTED: 3 10*9/L — ABNORMAL LOW (ref 3.6–11.2)

## 2022-02-01 LAB — LIPASE: LIPASE: 26 U/L (ref 12–53)

## 2022-02-01 MED ORDER — CEFDINIR 300 MG CAPSULE
ORAL_CAPSULE | Freq: Two times a day (BID) | ORAL | 0 refills | 10.00000 days | Status: CP
Start: 2022-02-01 — End: 2022-02-11

## 2022-02-01 MED ADMIN — lactated ringers bolus 1,000 mL: 1000 mL | INTRAVENOUS | @ 14:00:00 | Stop: 2022-02-01

## 2022-02-01 MED ADMIN — ondansetron (ZOFRAN) injection 4 mg: 4 mg | INTRAVENOUS | @ 14:00:00 | Stop: 2022-02-01

## 2022-02-01 MED ADMIN — acetaminophen (TYLENOL) tablet 650 mg: 650 mg | ORAL | @ 17:00:00 | Stop: 2022-02-01

## 2022-02-01 NOTE — Unmapped (Addendum)
Pt here with N/V x3 days. Neg for COVID/flu/RSV. Reports fatigue, dehydration, loss of appetite. Reports last night pt had intense pelvic pain, denies vaginal bleeding. Hx kidney/pancreatic transplant. On schedule with transplant medications. Hx neutropenia, reports recent bloodwork showed decreased WBC

## 2022-02-01 NOTE — Unmapped (Signed)
Pt reports N/V X 3 days

## 2022-02-01 NOTE — Unmapped (Signed)
[facility]  Emergency Department Provider Note     ED Clinical Impression     Final diagnoses:   Nausea (Primary)   Urinary tract infection without hematuria, site unspecified        Impression, Medical Decision Making, ED Course     8:39 AM   Impression: 36 y.o. 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 Upon arrival, VS WNL, afebrile, normal HR.    DDx/MDM: Nausea and vomiting in an immunocompromise patient.  Reports compliance with medications.  Concern for viral illness, gastroenteritis, urinary tract infection, pregnancy, medication side effect however less likely given patient has been on these medications for some time without any similar symptoms. Low suspicion for STI however given patient's presentation and history of prior PID, will collect GC/chlamydia.  No vaginal bleeding, discharge, or abdominal tenderness on exam.  Less likely intra-abdominal process as well given reassuring exam however will continue to monitor.  Endorses sick contact at home with son who has had a cough.    Plan for basic labs, lipase, UA, U pregnant, GC swabs, x-ray chest. Will treat patient with fluids and nausea medication.      ED Course as of 02/01/22 1837   Fri Feb 01, 2022   0912 Will give patient's home   1017 WBC, UA(!): 9   1018 Urobilinogen, UA(!): 2.0 mg/dL   1610 Leukocyte Esterase, UA(!): Trace   1018 WBC(!): 3.0  Near baseline   1018 Creatinine(!): 1.87   1018 BUN/Creatinine Ratio: 11   1018 Similar to prior    1031 Preg Test, Ur: Negative   1102 On reassessment, patient has completed a liter of fluid.  She reports feeling somewhat improved but does have a mild headache.  Offered more fluids, which patient declined. Requesting Tylenol.  Awaiting swabs and chest x-ray.   1128 X-ray chest negative.   1203 Updated patient's on findings.  On reassessment, she reports feeling improved and wishes to be discharged.  GC/chlamydia swabs have not yet resulted however given low suspicion feel comfortable discharge at this time.  Will discharge with antibiotics for simple cystitis in a patient with history of renal transplant.  No fevers or other systemic symptoms concerning for complicated UTI at this time.  Instructed to follow-up with primary care physician.  Strict return precautions reviewed.  Patient expressed understanding and is agreeable plan       ____________________________________________    The case was discussed with the attending physician, who is in agreement with the above assessment and plan.      History     Chief Complaint  Chief Complaint   Patient presents with    Emesis       HPI   Octa Hawraa Kobza is a 36 y.o. female with past medical history as below who presents with nausea and vomiting for 3 days.  She describes feeling like she is dehydrated and has been having decreased p.o. in this time.  Patient states she has been able to tolerate a small amount of p.o. and has been having some chicken broth.  She describes feeling like she does not want to eat.  Symptoms improved yesterday but further worsened again today.  She reports 1 episode of vomiting the last 24 hours.  Denies any bloody or bilious vomiting.  She complains of generalized abdominal cramping which is somewhat worse in the lower abdomen.  Patient states she does feel somewhat like she has a urinary tract infection.  Denies vaginal bleeding or discharge.  Denies fever, chills, chest pain, abdominal pain, or shortness of breath.  She states she has had a cough for about 1 to 2 weeks as well.  Patient was seen in the emergency department approximately 1 year ago with similar symptoms.  At that time, she found out she was pregnant.  She states she had a negative pregnancy test yesterday.  She states she is sexually active and has a low concern for sexually transmitted infection.  Patient reports taking all her immunosuppressive medications as directed.  She states that she has not vomited near the times of taking these medication and feels confident that she does not miss a dose.    External Records Reviewed: I have reviewed previous emergency department visit for similar symptoms.    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       Past Surgical History:   Procedure Laterality Date    AV FISTULA PLACEMENT  2018    CESAREAN SECTION      PR COLONOSCOPY FLX DX W/COLLJ SPEC WHEN PFRMD N/A 02/14/2020    Procedure: COLONOSCOPY, FLEXIBLE, PROXIMAL TO SPLENIC FLEXURE; DIAGNOSTIC, W/WO COLLECTION SPECIMEN BY BRUSH OR WASH;  Surgeon: Carmon Ginsberg, MD;  [facility];  Service: Gastroenterology    PR COLONOSCOPY FLX DX W/COLLJ SPEC WHEN PFRMD N/A 06/19/2020    Procedure: COLONOSCOPY, FLEXIBLE, PROXIMAL TO SPLENIC FLEXURE; DIAGNOSTIC, W/WO COLLECTION SPECIMEN BY BRUSH OR WASH;  Surgeon: Carmon Ginsberg, MD;  Location: [facility];  Service: Gastroenterology    PR FECAL MICROBIOTA PREP INSTIL N/A 02/14/2020    Procedure: PREP W INSTILLATION FECAL MICROBIOTA, ANY METHOD;  Surgeon: Carmon Ginsberg, MD;  Location: [facility];  Service: Gastroenterology    PR INDUCED ABORTN BY DIL/EVAC N/A 03/20/2021    Procedure: DILATION AND EVACUATION - INDUCED;  Surgeon: Gaynelle Cage, MD;  Location: [facility];  Service: Family Planning    PR LAP,DIAGNOSTIC ABDOMEN N/A 11/17/2020    Procedure: DIAGNOSTIC AND OR OPERATIVE LAPAROSCOPY;  Surgeon: Estil Daft, MD;  Location: MAIN OR [facility];  Service: Womens Primary Gynecology    PR PREPARE FECAL MICROBIOTA FOR INSTILLATION  06/19/2020    Procedure: PREP FECAL MICROBIOTA FOR INSTILLATION, INCLUDING ASSESSMENT OF DONOR SPECIMEN;  Surgeon: Carmon Ginsberg, MD;  Location: [facility];  Service: Gastroenterology    PR SURG RX MISSED ABORTN,1ST TRI N/A 11/17/2020    Procedure: VACUUM ASPIRATION;  Surgeon: Estil Daft, MD;  Location: MAIN OR [facility];  Service: [facility] Primary Gynecology    PR TRANSPLANT ALLOGRAFT PANCREAS N/A 05/03/2019    Procedure: TRANSPLANTATION OF PANCREATIC ALLOGRAFT;  Surgeon: Leona Carry, MD;  Location: MAIN OR [facility];  Service: Transplant    PR TRANSPLANT,PREP CADAVER RENAL GRAFT N/A 05/03/2019    Procedure: [facility] STD PREP CAD DONR RENAL ALLOGFT PRIOR TO TRNSPLNT, INCL DISSEC/REM PERINEPH FAT, DIAPH/RTPER ATTAC;  Surgeon: Leona Carry, MD;  Location: MAIN OR [facility];  Service: Transplant    PR TRANSPLANT,PREP DONOR PANCREAS N/A 05/03/2019  Procedure: BACKBENCH STANDARD PREPARATION OF CADAVER DONOR PANCREAS ALLOGRAFT PRIOR TO TRANSPLANTATION;  Surgeon: Leona Carry, MD;  Location: MAIN OR [facility];  Service: Transplant    PR TRANSPLANTATION OF KIDNEY N/A 05/03/2019    Procedure: RENAL ALLOTRANSPLANTATION, IMPLANTATION OF GRAFT; WITHOUT RECIPIENT NEPHRECTOMY;  Surgeon: Leona Carry, MD;  Location: MAIN OR [facility]  Service: Transplant    TONSILLECTOMY         No current facility-administered medications for this encounter.    Current Outpatient Medications:     aspirin (ECOTRIN) 81 MG tablet, Take 1 tablet (81 mg total) by mouth daily., Disp: 30 tablet, Rfl: 11    azathioprine (IMURAN) 50 mg tablet, Take 2 tablets (100 mg total) by mouth daily, Disp: 60 tablet, Rfl: 11    cefdinir (OMNICEF) 300 MG capsule, Take 1 capsule (300 mg total) by mouth two (2) times a day for 10 days., Disp: 20 capsule, Rfl: 0    magnesium oxide (MAG-OX) 400 mg (241.3 mg elemental magnesium) tablet, Take 1 tablet (400 mg total) by mouth daily., Disp: 30 tablet, Rfl: 11    metroNIDAZOLE (METROCREAM) 0.75 % cream, Apply topically Two (2) times a day., Disp: 45 g, Rfl: 0    multivitamin, prenatal, folic acid-iron, 29 mg iron- 1 mg, Take 1 tablet by mouth daily., Disp: 90 tablet, Rfl: 5    NIFEdipine (PROCARDIA XL) 30 MG 24 hr tablet, Take 1 tablet (30 mg total) by mouth daily., Disp: 90 tablet, Rfl: 3    sodium bicarbonate 650 mg tablet, Take 2 tablets (1,300 mg total) by mouth Three (3) times a day., Disp: 180 tablet, Rfl: 3    tacrolimus (ENVARSUS XR) 1 mg Tb24 extended release tablet, Take 2 tablets (2 mg total) by mouth daily. Take  2 tablets ( 2 mg total) in addition to 4 tablets ( 16 mg total)  for a total daily dose of 18 mg daily, Disp: 60 tablet, Rfl: 11    tacrolimus (ENVARSUS XR) 1 mg Tb24 extended release tablet, Take 2 tablets (2 mg total) by mouth in the morning. Take FOUR 4 mg tablets (16 mg total) and TWO 1 mg tablet (2 mg total) for a total dose of 18 mg daily., Disp: 60 tablet, Rfl: 11    tacrolimus (ENVARSUS XR) 4 mg Tb24 extended release tablet, Take 4 tablets (16 mg total) by mouth daily. Take FOUR 4 mg tablets (16 mg total) and TWO 1 mg tablet (2 mg total) for a total dose of 18 mg daily, Disp: 120 tablet, Rfl: 11    Allergies  Iodinated contrast media, Nickel, Propranolol, Eye irrigating solution [ophthalmic irrigation solution], Iodine, Naltrexone, and Uni-cortrom    Family History  Family History   Problem Relation Age of Onset    Diabetes Mother     Diabetes Father     Cancer Maternal Grandmother     Diabetes Maternal Grandfather     Diabetes Paternal Grandmother        Social History  Social History     Tobacco Use    Smoking status: Never    Smokeless tobacco: Never   Vaping Use    Vaping Use: Never used   Substance Use Topics    Alcohol use: Never    Drug use: Never        Physical Exam     VITAL SIGNS:      Vitals:    02/01/22 0827 02/01/22 0831 02/01/22 0834 02/01/22 1241   BP:  113/74  109/52  Pulse: 98 87  94   Resp: 20  17   Temp:  36.8 ??C (98.2 ??F)  36.8 ??C (98.3 ??F)   TempSrc:  Oral  Oral   SpO2: 99% 100%  99%   Weight:   81.6 kg (180 lb)        Constitutional: Alert and oriented. No acute distress.  Eyes: Conjunctivae are normal.  HEENT: Normocephalic and atraumatic. Conjunctivae clear. No congestion. Moist mucous membranes.   Cardiovascular: Rate as above, regular rhythm. Normal and symmetric distal pulses. Brisk capillary refill. Normal skin turgor.  Respiratory: Normal respiratory effort. Breath sounds are normal. There are no wheezing or crackles heard.  Gastrointestinal: Soft, non-distended, non-tender.  Genitourinary: Deferred.  Musculoskeletal: Non-tender with normal range of motion in all extremities.  Neurologic: Normal speech and language. No gross focal neurologic deficits are appreciated. Patient is moving all extremities equally, face is symmetric at rest and with speech.  Skin: Skin is warm, dry and intact. No rash noted.  Psychiatric: Mood and affect are normal. Speech and behavior are normal.     Radiology     XR Chest 2 views   Final Result      No acute airspace disease.          Pertinent labs & imaging results that were available during my care of the patient were independently interpreted by me and considered in my medical decision making (see chart for details).    Portions of this record have been created using Scientist, clinical (histocompatibility and immunogenetics). Dictation errors have been sought, but may not have been identified and corrected.         Jobe Marker, MD  Resident  02/01/22 [phone]

## 2022-02-02 NOTE — Unmapped (Addendum)
Urine Culture  Order: [id number] - Reflex for Order [id number]  Status: Final result      Clean Catch; Urine  Urine Culture, Comprehensive   50,000 to 100,000 CFU/mL Coagulase negative Staphylococcus species     Patient with a positive urine culture. Patient was started on  cefdinir 300 mg Oral 2 times a day (standard) prior to dispo from the ED. EMAP messaged to confirm coverage.    02/02/2022 2040: I attempted to reach patient with no answer at this time. She does not have voicemail for me to leave a message.    02/02/2022 2044: Patient called back at this time. I went over her urine culture results. Patient stating she is feeling better at this time. I reviewed return precautions with her. No further questions.

## 2022-02-04 DIAGNOSIS — Z940 Kidney transplant status: Principal | ICD-10-CM

## 2022-02-04 DIAGNOSIS — Z79899 Other long term (current) drug therapy: Principal | ICD-10-CM

## 2022-02-08 NOTE — Unmapped (Signed)
[facility] Refill Coordination Note    Specialty Medication(s) to be Shipped:   Transplant: Envarsus 1mg , Envarsus 4mg  and azathioprine 50mg     Other medication(s) to be shipped: aspirin   Patient declined ALL other medication refills today     Elaine Koch, DOB: [birth year]  Phone: [phone] (home)       All above HIPAA information was verified with patient.     Was a Nurse, learning disability used for this call? No    Completed refill call assessment today to schedule patient's medication shipment from the [facility] [phone]).  All relevant notes have been reviewed.     Specialty medication(s) and dose(s) confirmed: Regimen is correct and unchanged.   Changes to medications: Shalayah reports no changes at this time.  Changes to insurance: No  New side effects reported not previously addressed with a pharmacist or physician: None reported  Questions for the pharmacist: No    Confirmed patient received a Conservation officer, historic buildings and a Surveyor, mining with first shipment. The patient will receive a drug information handout for each medication shipped and additional FDA Medication Guides as required.       DISEASE/MEDICATION-SPECIFIC INFORMATION        N/A    SPECIALTY MEDICATION ADHERENCE     Medication Adherence    Patient reported X missed doses in the last month: 0  Specialty Medication: envarsus 1mg   Patient is on additional specialty medications: Yes  Additional Specialty Medications: Envarsus 4mg   Patient Reported Additional Medication X Missed Doses in the Last Month: 0  Patient is on more than two specialty medications: Yes  Specialty Medication: azathioprine 50mg   Patient Reported Additional Medication X Missed Doses in the Last Month: 0                          Were doses missed due to medication being on hold? No    envarsus 1mg   : 10 days of medicine on hand   envarsus 4mg   : 10 days of medicine on hand   Azathioprine 50mg   : 10 days of medicine on hand     REFERRAL TO PHARMACIST     Referral to the pharmacist: Not needed      [facility]     Shipping address confirmed in Epic.     Delivery Scheduled: Yes, Expected medication delivery date: 02/14/2022.     Medication will be delivered via UPS to the prescription address in Epic WAM.    Thad Ranger, PharmD   [facility] Specialty Pharmacist

## 2022-02-11 DIAGNOSIS — D708 Other neutropenia: Principal | ICD-10-CM

## 2022-02-13 MED FILL — AZATHIOPRINE 50 MG TABLET: ORAL | 30 days supply | Qty: 60 | Fill #1

## 2022-02-13 MED FILL — ASPIRIN 81 MG TABLET,DELAYED RELEASE: ORAL | 30 days supply | Qty: 30 | Fill #9

## 2022-02-13 MED FILL — ENVARSUS XR 1 MG TABLET,EXTENDED RELEASE: ORAL | 30 days supply | Qty: 60 | Fill #8

## 2022-02-13 MED FILL — ENVARSUS XR 4 MG TABLET,EXTENDED RELEASE: ORAL | 30 days supply | Qty: 120 | Fill #8

## 2022-02-18 DIAGNOSIS — Z940 Kidney transplant status: Principal | ICD-10-CM

## 2022-03-03 DIAGNOSIS — Z940 Kidney transplant status: Principal | ICD-10-CM

## 2022-03-04 DIAGNOSIS — Z940 Kidney transplant status: Principal | ICD-10-CM

## 2022-03-04 DIAGNOSIS — Z79899 Other long term (current) drug therapy: Principal | ICD-10-CM

## 2022-03-07 NOTE — Unmapped (Signed)
[facility] Refill Coordination Note    Specialty Medication(s) to be Shipped:   Transplant: Envarsus 1mg , Envarsus 4mg , and azathioprine     Other medication(s) to be shipped:  aspirin      Elaine Koch, DOB: [birth year]  Phone: [phone] (home)       All above HIPAA information was verified with patient.     Was a Nurse, learning disability used for this call? No    Completed refill call assessment today to schedule patient's medication shipment from the [facility] [phone]).  All relevant notes have been reviewed.     Specialty medication(s) and dose(s) confirmed: Regimen is correct and unchanged.   Changes to medications: Elaine Koch reports no changes at this time.  Changes to insurance: No  New side effects reported not previously addressed with a pharmacist or physician: None reported  Questions for the pharmacist: No    Confirmed patient received a Conservation officer, historic buildings and a Surveyor, mining with first shipment. The patient will receive a drug information handout for each medication shipped and additional FDA Medication Guides as required.       DISEASE/MEDICATION-SPECIFIC INFORMATION        N/A    SPECIALTY MEDICATION ADHERENCE     Medication Adherence    Patient reported X missed doses in the last month: 0  Specialty Medication: azathioprine 50 mg tablet (IMURAN)  Patient is on additional specialty medications: Yes  Additional Specialty Medications: ENVARSUS XR 1 mg Tb24 extended release tablet (tacrolimus)  Patient Reported Additional Medication X Missed Doses in the Last Month: 0  Patient is on more than two specialty medications: Yes  Specialty Medication: ENVARSUS XR 4 mg Tb24 extended release tablet (tacrolimus)  Patient Reported Additional Medication X Missed Doses in the Last Month: 0                                Were doses missed due to medication being on hold? No    Envarsus 1 mg: 10 days of medicine on hand   Envarsus 4 mg: 10 days of medicine on hand azathioprine 50 mg: 10 days of medicine on hand       REFERRAL TO PHARMACIST     Referral to the pharmacist: Not needed      [facility]     Shipping address confirmed in Epic.     Delivery Scheduled: Yes, Expected medication delivery date: 03/13/22.     Medication will be delivered via UPS to the prescription address in Epic WAM.    [name]   [facility] Specialty Technician

## 2022-03-12 MED FILL — ASPIRIN 81 MG TABLET,DELAYED RELEASE: ORAL | 30 days supply | Qty: 30 | Fill #10

## 2022-03-12 MED FILL — ENVARSUS XR 1 MG TABLET,EXTENDED RELEASE: ORAL | 30 days supply | Qty: 60 | Fill #9

## 2022-03-12 MED FILL — ENVARSUS XR 4 MG TABLET,EXTENDED RELEASE: ORAL | 30 days supply | Qty: 120 | Fill #9

## 2022-03-12 MED FILL — AZATHIOPRINE 50 MG TABLET: ORAL | 30 days supply | Qty: 60 | Fill #2

## 2022-03-14 DIAGNOSIS — D708 Other neutropenia: Principal | ICD-10-CM

## 2022-03-15 DIAGNOSIS — Z79899 Other long term (current) drug therapy: Principal | ICD-10-CM

## 2022-03-15 DIAGNOSIS — E119 Type 2 diabetes mellitus without complications: Principal | ICD-10-CM

## 2022-03-15 DIAGNOSIS — Z9483 Pancreas transplant status: Principal | ICD-10-CM

## 2022-03-15 DIAGNOSIS — Z940 Kidney transplant status: Principal | ICD-10-CM

## 2022-03-21 ENCOUNTER — Telehealth: Admit: 2022-03-21 | Discharge: 2022-03-22 | Payer: MEDICARE | Attending: Nephrology | Primary: Nephrology

## 2022-03-21 DIAGNOSIS — Z940 Kidney transplant status: Principal | ICD-10-CM

## 2022-03-21 NOTE — Unmapped (Signed)
Was unable to reach patient for video visit today. Will attempt to reschedule. Has not had labs in our system since 02/01/22 in spite of instruction to do so.

## 2022-04-01 DIAGNOSIS — Z79899 Other long term (current) drug therapy: Principal | ICD-10-CM

## 2022-04-01 DIAGNOSIS — Z940 Kidney transplant status: Principal | ICD-10-CM

## 2022-04-02 NOTE — Unmapped (Signed)
[facility] Refill Coordination Note    Specialty Medication(s) to be Shipped:   Transplant: Envarsus 1mg , Envarsus 4mg , and Azathioprine 50mg     Other medication(s) to be shipped: No additional medications requested for fill at this time     Elaine Koch, DOB: 1985-12-09  Phone: [phone] (home)       All above HIPAA information was verified with patient.     Was a Nurse, learning disability used for this call? No    Completed refill call assessment today to schedule patient's medication shipment from the [facility] [phone]).  All relevant notes have been reviewed.     Specialty medication(s) and dose(s) confirmed: Regimen is correct and unchanged.   Changes to medications: Elaine Koch reports no changes at this time.  Changes to insurance: No  New side effects reported not previously addressed with a pharmacist or physician: None reported  Questions for the pharmacist: No    Confirmed patient received a Conservation officer, historic buildings and a Surveyor, mining with first shipment. The patient will receive a drug information handout for each medication shipped and additional FDA Medication Guides as required.       DISEASE/MEDICATION-SPECIFIC INFORMATION        N/A    SPECIALTY MEDICATION ADHERENCE     Medication Adherence    Patient reported X missed doses in the last month: 0  Specialty Medication: Envarsus Xr 1mg   Patient is on additional specialty medications: Yes  Additional Specialty Medications: Envarsus Xr 4mg   Patient Reported Additional Medication X Missed Doses in the Last Month: 0  Patient is on more than two specialty medications: Yes  Specialty Medication: Azathioprine 50mg   Patient Reported Additional Medication X Missed Doses in the Last Month: 0              Were doses missed due to medication being on hold? No    Envarsus Xr 1 mg: 5 days of medicine on hand   Envarsus Xr 4 mg: 5 days of medicine on hand   Azathioprine 50 mg: 5 days of medicine on hand     REFERRAL TO PHARMACIST     Referral to the pharmacist: Not needed      [facility]     Shipping address confirmed in Epic.     Patient was notified of new phone menu : Yes    Delivery Scheduled: Yes, Expected medication delivery date: 04/08/22.     Medication will be delivered via UPS to the prescription address in Epic WAM.    Elaine Koch, [facility]   [facility] Specialty Pharmacist

## 2022-04-03 ENCOUNTER — Telehealth: Admit: 2022-04-03 | Payer: MEDICARE | Attending: Nephrology | Primary: Nephrology

## 2022-04-03 NOTE — Unmapped (Signed)
Attempted to call patient prior to video visit today with Dr. Carlene Coria, unable to leave VM

## 2022-04-03 NOTE — Unmapped (Signed)
Attempted to contact patient for scheduled video visit, there was no response. Will attempt to reschedule. Was also a no show for video appointment 03/21/22, and has not had labs since 02/01/22.

## 2022-04-05 NOTE — Unmapped (Addendum)
Elaine Koch 's entire shipment will be delayed as a result of insurance processor down.     I have reached out to the patient  at [phone] and was unable to leave a message. We will send a text message to the patient and reschedule the medication for the delivery date that the patient agreed upon.  We have confirmed the delivery date as 04/09/2022, via ups.

## 2022-04-06 ENCOUNTER — Emergency Department (HOSPITAL_COMMUNITY)
Admission: EM | Admit: 2022-04-06 | Discharge: 2022-04-06 | Disposition: A | Discharge status: Home / Self Care | Payer: Medicare Other | Attending: Emergency Medicine | Admitting: Emergency Medicine

## 2022-04-06 ENCOUNTER — Other Ambulatory Visit: Payer: Self-pay

## 2022-04-06 ENCOUNTER — Encounter (HOSPITAL_COMMUNITY): Payer: Self-pay

## 2022-04-06 ENCOUNTER — Emergency Department (HOSPITAL_COMMUNITY): Payer: Medicare Other

## 2022-04-06 DIAGNOSIS — R109 Unspecified abdominal pain: Secondary | ICD-10-CM | POA: Insufficient documentation

## 2022-04-06 DIAGNOSIS — E1022 Type 1 diabetes mellitus with diabetic chronic kidney disease: Secondary | ICD-10-CM | POA: Diagnosis not present

## 2022-04-06 DIAGNOSIS — Z794 Long term (current) use of insulin: Secondary | ICD-10-CM | POA: Diagnosis not present

## 2022-04-06 DIAGNOSIS — Z992 Dependence on renal dialysis: Secondary | ICD-10-CM | POA: Diagnosis not present

## 2022-04-06 DIAGNOSIS — N186 End stage renal disease: Secondary | ICD-10-CM | POA: Insufficient documentation

## 2022-04-06 DIAGNOSIS — J189 Pneumonia, unspecified organism: Secondary | ICD-10-CM

## 2022-04-06 DIAGNOSIS — Z7982 Long term (current) use of aspirin: Secondary | ICD-10-CM | POA: Insufficient documentation

## 2022-04-06 DIAGNOSIS — J168 Pneumonia due to other specified infectious organisms: Secondary | ICD-10-CM | POA: Diagnosis not present

## 2022-04-06 DIAGNOSIS — Z20822 Contact with and (suspected) exposure to covid-19: Secondary | ICD-10-CM | POA: Insufficient documentation

## 2022-04-06 DIAGNOSIS — R110 Nausea: Secondary | ICD-10-CM

## 2022-04-06 DIAGNOSIS — R112 Nausea with vomiting, unspecified: Secondary | ICD-10-CM | POA: Diagnosis present

## 2022-04-06 LAB — CBC WITH DIFFERENTIAL/PLATELET
Abs Immature Granulocytes: 0.04 10*3/uL (ref 0.00–0.07)
Basophils Absolute: 0 10*3/uL (ref 0.0–0.1)
Basophils Relative: 1 %
Eosinophils Absolute: 0.1 10*3/uL (ref 0.0–0.5)
Eosinophils Relative: 2 %
HCT: 29.9 % — ABNORMAL LOW (ref 36.0–46.0)
Hemoglobin: 10.6 g/dL — ABNORMAL LOW (ref 12.0–15.0)
Immature Granulocytes: 2 %
Lymphocytes Relative: 15 %
Lymphs Abs: 0.4 10*3/uL — ABNORMAL LOW (ref 0.7–4.0)
MCH: 37.2 pg — ABNORMAL HIGH (ref 26.0–34.0)
MCHC: 35.5 g/dL (ref 30.0–36.0)
MCV: 104.9 fL — ABNORMAL HIGH (ref 80.0–100.0)
Monocytes Absolute: 0.3 10*3/uL (ref 0.1–1.0)
Monocytes Relative: 9 %
Neutro Abs: 2 10*3/uL (ref 1.7–7.7)
Neutrophils Relative %: 71 %
Platelets: 254 10*3/uL (ref 150–400)
RBC: 2.85 MIL/uL — ABNORMAL LOW (ref 3.87–5.11)
RDW: 13.6 % (ref 11.5–15.5)
WBC: 2.8 10*3/uL — ABNORMAL LOW (ref 4.0–10.5)
nRBC: 0 % (ref 0.0–0.2)

## 2022-04-06 LAB — COMPREHENSIVE METABOLIC PANEL
ALT: 24 U/L (ref 0–44)
AST: 33 U/L (ref 15–41)
Albumin: 4.7 g/dL (ref 3.5–5.0)
Alkaline Phosphatase: 42 U/L (ref 38–126)
Anion gap: 11 (ref 5–15)
BUN: 33 mg/dL — ABNORMAL HIGH (ref 6–20)
CO2: 21 mmol/L — ABNORMAL LOW (ref 22–32)
Calcium: 9.7 mg/dL (ref 8.9–10.3)
Chloride: 109 mmol/L (ref 98–111)
Creatinine, Ser: 1.79 mg/dL — ABNORMAL HIGH (ref 0.44–1.00)
GFR, Estimated: 38 mL/min — ABNORMAL LOW (ref >60)
Glucose, Bld: 98 mg/dL (ref 70–99)
Potassium: 4.3 mmol/L (ref 3.5–5.1)
Sodium: 141 mmol/L (ref 135–145)
Total Bilirubin: 2.5 mg/dL — ABNORMAL HIGH (ref 0.3–1.2)
Total Protein: 8.8 g/dL — ABNORMAL HIGH (ref 6.5–8.1)

## 2022-04-06 LAB — RESP PANEL BY RT-PCR (RSV, FLU A&B, COVID)  RVPGX2
Influenza A by PCR: NEGATIVE
Influenza B by PCR: NEGATIVE
Resp Syncytial Virus by PCR: NEGATIVE
SARS Coronavirus 2 by RT PCR: NEGATIVE

## 2022-04-06 LAB — URINALYSIS, ROUTINE W REFLEX MICROSCOPIC
Bilirubin Urine: NEGATIVE
Glucose, UA: NEGATIVE mg/dL
Hgb urine dipstick: NEGATIVE
Ketones, ur: 20 mg/dL — AB
Leukocytes,Ua: NEGATIVE
Nitrite: NEGATIVE
Protein, ur: NEGATIVE mg/dL
Specific Gravity, Urine: 1.013 (ref 1.005–1.030)
pH: 6 (ref 5.0–8.0)

## 2022-04-06 LAB — LIPASE, BLOOD: Lipase: 26 U/L (ref 11–51)

## 2022-04-06 LAB — PREGNANCY, URINE: Preg Test, Ur: NEGATIVE

## 2022-04-06 MED ORDER — AMOXICILLIN 500 MG PO CAPS: 1000.0000 mg | ORAL_CAPSULE | Freq: Three times a day (TID) | ORAL | 0 refills | Status: AC

## 2022-04-06 MED ORDER — ONDANSETRON HCL 4 MG PO TABS: 4.0000 mg | ORAL_TABLET | Freq: Four times a day (QID) | ORAL | 0 refills | Status: AC

## 2022-04-06 MED ORDER — AMOXICILLIN 500 MG PO CAPS
1000.0000 mg | ORAL_CAPSULE | Freq: Once | ORAL | Status: AC
  Administered 2022-04-06: 1000 mg via ORAL
  Filled 2022-04-06: qty 2

## 2022-04-06 MED ORDER — SODIUM CHLORIDE 0.9 % IV BOLUS
1000.0000 mL | Freq: Once | INTRAVENOUS | Status: AC
  Administered 2022-04-06: 1000 mL via INTRAVENOUS

## 2022-04-06 MED ORDER — ONDANSETRON HCL 4 MG/2ML IJ SOLN: 4.0000 mg | Freq: Once | INTRAMUSCULAR | Status: DC

## 2022-04-06 MED ORDER — ONDANSETRON 4 MG PO TBDP
4.0000 mg | ORAL_TABLET | Freq: Once | ORAL | Status: AC
  Administered 2022-04-06: 4 mg via ORAL
  Filled 2022-04-06: qty 1

## 2022-04-06 MED ORDER — MORPHINE SULFATE (PF) 4 MG/ML IV SOLN
4.0000 mg | Freq: Once | INTRAVENOUS | Status: AC
  Administered 2022-04-06: 4 mg via INTRAVENOUS
  Filled 2022-04-06: qty 1

## 2022-04-06 MED ORDER — ONDANSETRON HCL 4 MG/2ML IJ SOLN
4.0000 mg | Freq: Once | INTRAMUSCULAR | Status: AC
  Administered 2022-04-06: 4 mg via INTRAVENOUS
  Filled 2022-04-06: qty 2

## 2022-04-06 NOTE — Discharge Instructions (Signed)
You were seen in the emergency department for your nausea and vomiting and your left-sided pain.  Your workup shows that you have pneumonia in your left lung which is causing your pain.  I have given you a prescription of antibiotics and you should complete this as prescribed.  You can continue to take Zofran for your nausea and make sure that you are drinking plenty of fluids and staying well-hydrated.  You should follow-up with your primary doctor in the next few days to have your symptoms rechecked.  You should return to the emergency department for worsening shortness of breath, repetitive vomiting despite the nausea medication or if you have any other new or concerning symptoms.

## 2022-04-06 NOTE — ED Triage Notes (Signed)
Pt arrived via EMS from, [facility], 12hrs ago, LUQ radiate to back. Nausea and vomiting.  EMS vitals 162/98BP, 104HR, 98%, 101CBG, 14RR. No intervention by EMS.

## 2022-04-06 NOTE — ED Provider Notes (Signed)
[facility] Provider Note   CSN: [id number] Arrival date & time: 04/06/22  1956     History  Chief Complaint  Patient presents with   Nausea   Emesis    Christine Reed is a 37 y.o. female.  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  The patient states that she woke up this morning with nausea and has been vomiting throughout the day.  She states that she is having some left-sided abdominal and flank pain that radiates into her left mid back.  She denies any associated chest pain or shortness of breath.  She states that she has been feeling hot and cold but has not had a measured fever at home.  She denies any associated cough.  She denies any dysuria or hematuria, diarrhea or constipation.  The history is provided by the patient.  Emesis      Home Medications Prior to Admission medications   Medication Sig Start Date End Date Taking? Authorizing Provider  amoxicillin (AMOXIL) 500 MG capsule Take 2 capsules (1,000 mg total) by mouth 3 (three) times daily for 5 days. 04/06/22 04/11/22 Yes Maylon Peppers, Jordan Hawks K, DO  ondansetron (ZOFRAN) 4 MG tablet Take 1 tablet (4 mg total) by mouth every 6 (six) hours. 04/06/22  Yes Maylon Peppers, [place] K, DO  amLODipine (NORVASC) 10 MG tablet Take 1 tablet (10 mg total) by mouth daily. 01/07/19   Aline August, MD  aspirin EC 81 MG tablet Take 81 mg by mouth daily. Swallow whole.    [provider]  azaTHIOprine (IMURAN) 50 MG tablet Take 50 mg by mouth in the morning and at bedtime.    [provider]  Biotin 1000 MCG CHEW Chew by mouth.    [provider]  glucagon (GLUCAGON EMERGENCY) 1 MG injection Inject 1 mg into the skin once as needed for up to 1 dose. Inject '1mg'$  into skin once as needed for  low blood sugar. 06/03/17   Elayne Snare, MD  insulin aspart (NOVOLOG) 100 UNIT/ML injection Inject 0-20 Units into the skin 3 (three) times daily before meals. Sliding scale 09/10/18   Elayne Snare, MD  Insulin Detemir (LEVEMIR FLEXTOUCH) 100 UNIT/ML Pen INJECT 10 UNITS INTO THE SKIN IN THE MORNING AND INJECT 11 UNITS INTO THE SKIN IN THE EVENING. Patient taking differently: Inject 10-11 Units into the skin See admin instructions. Inject 10 units in the morning and 11 units in the evening. 09/10/18   Elayne Snare, MD  losartan (COZAAR) 50 MG tablet Take 50 mg by mouth at bedtime. 12/11/18   [provider]  metoprolol succinate (TOPROL-XL) 50 MG 24 hr tablet Take 50 mg by mouth daily. 07/22/18   [provider]  metroNIDAZOLE (FLAGYL) 500 MG tablet Take 500 mg by mouth 2 (two) times daily. 02/16/19   [provider]  promethazine-dextromethorphan (PROMETHAZINE-DM) 6.25-15 MG/5ML syrup Take 5 mLs by mouth 4 (four) times daily as needed for cough. 02/23/19   Pisciotta, Elmyra Ricks, PA-C  sevelamer (RENAGEL) 800 MG tablet Take 2,400 mg by mouth See admin instructions. Take '2400mg'$  three times daily with each meal and snacks 04/10/17   [provider]  sodium bicarbonate 650 MG tablet Take 1,300 mg by mouth 3 (three) times daily.    [provider]  Tacrolimus ER (  ENVARSUS XR) 4 MG TB24 Take 16 mg by mouth.    [provider]  buPROPion (WELLBUTRIN XL) 150 MG 24 hr tablet Take 150 mg daily by mouth.  11/10/18  [provider]      Allergies    Ivp dye [iodinated contrast media], Propranolol hcl er, Iodine, and Nickel    Review of Systems   Review of Systems  Gastrointestinal:  Positive for vomiting.    Physical Exam Updated Vital Signs BP 131/73   Pulse 88   Temp 97.8 F (36.6 C)   Resp 17   Ht '5\' 5"'$  (1.651 m)   Wt 81.6 kg   SpO2 95%   BMI 29.95 kg/m  Physical Exam Vitals and nursing note reviewed.  Constitutional:      General: She is  not in acute distress.    Appearance: She is ill-appearing.  HENT:     Head: Normocephalic and atraumatic.     Nose: Nose normal.     Mouth/Throat:     Mouth: Mucous membranes are moist.     Pharynx: Oropharynx is clear.  Eyes:     Extraocular Movements: Extraocular movements intact.     Conjunctiva/sclera: Conjunctivae normal.  Cardiovascular:     Rate and Rhythm: Normal rate and regular rhythm.     Heart sounds: Normal heart sounds.  Pulmonary:     Effort: Pulmonary effort is normal.     Breath sounds: Normal breath sounds.  Abdominal:     General: Abdomen is flat.     Palpations: Abdomen is soft.     Tenderness: There is abdominal tenderness (Epigastrium). There is no right CVA tenderness, left CVA tenderness, guarding or rebound.  Musculoskeletal:        General: Normal range of motion.     Cervical back: Normal range of motion and neck supple.     Comments: No midline back tenderness  L-sided upper back tenderness to palpation  Skin:    General: Skin is warm and dry.     Comments: RUE fistula  Neurological:     General: No focal deficit present.     Mental Status: She is alert and oriented to person, place, and time.  Psychiatric:        Mood and Affect: Mood normal.        Behavior: Behavior normal.     ED Results / Procedures / Treatments   Labs (all labs ordered are listed, but only abnormal results are displayed) Labs Reviewed  COMPREHENSIVE METABOLIC PANEL - Abnormal; Notable for the following components:      Result Value   CO2 21 (*)    BUN 33 (*)    Creatinine, Ser 1.79 (*)    Total Protein 8.8 (*)    Total Bilirubin 2.5 (*)    GFR, Estimated 38 (*)    All other components within normal limits  CBC WITH DIFFERENTIAL/PLATELET - Abnormal; Notable for the following components:   WBC 2.8 (*)    RBC 2.85 (*)    Hemoglobin 10.6 (*)    HCT 29.9 (*)    MCV 104.9 (*)    MCH 37.2 (*)    Lymphs Abs 0.4 (*)    All other components within normal limits   URINALYSIS, ROUTINE W REFLEX MICROSCOPIC - Abnormal; Notable for the following components:   Ketones, ur 20 (*)    All other components within normal limits  RESP PANEL BY RT-PCR (RSV, FLU A&B, COVID)  RVPGX2  LIPASE, BLOOD  PREGNANCY, URINE  TACROLIMUS LEVEL    EKG None  Radiology DG Chest 1 View  Result Date: 04/06/2022 CLINICAL DATA:  Left flank/chest pain. Shortness of breath. EXAM: CHEST  1 VIEW COMPARISON:  01/07/2022 FINDINGS: Chronic cardiomegaly. Unchanged mediastinal contours. Ill-defined opacity in the left mid and lower lung zone. There may be trace bilateral pleural effusions. Slight vascular congestion. No pneumothorax. No acute osseous abnormalities are seen. IMPRESSION: 1. Chronic cardiomegaly. Slight vascular congestion. 2. Ill-defined opacity in the left mid and lower lung zone, atelectasis versus airspace disease. Electronically Signed   By: Keith Rake M.D.   On: 04/06/2022 20:34    Procedures Procedures    Medications Ordered in ED Medications  amoxicillin (AMOXIL) capsule 1,000 mg (has no administration in time range)  sodium chloride 0.9 % bolus 1,000 mL (1,000 mLs Intravenous New Bag/Given 04/06/22 2034)  ondansetron [facility]) injection 4 mg (4 mg Intravenous Given 04/06/22 2035)  morphine (PF) 4 MG/ML injection 4 mg (4 mg Intravenous Given 04/06/22 2035)    ED Course/ Medical Decision Making/ A&P Clinical Course as of 04/06/22 2200  Sat Apr 06, 2022  2143 CXR with possible LLL pneumonia which correlates with her pain. She will be treated with antibiotics and is stable for discharge home. [VK]    Clinical Course User Index [VK] Kemper Durie, DO                             Medical Decision Making This patient presents to the ED with chief complaint(s) of N/V with pertinent past medical history of DM & ESRD s/p renal and pancreas transplant in 2021 which further complicates the presenting complaint. The complaint involves an extensive  differential diagnosis and also carries with it a high risk of complications and morbidity.    The differential diagnosis includes rejection, pancreatitis, hepatitis, viral syndrome, pneumonia, pneumothorax, diabetes, nephrolithiasis, urinary tract infection, gastritis, GERD, gastroenteritis  Additional history obtained: Additional history obtained from N/A Records reviewed Care Everywhere/External Records  ED Course and Reassessment: Patient was awake and alert though ill-appearing and actively vomiting on arrival.  She does have some epigastric and left upper quadrant tenderness to palpation as well as left upper back tenderness to palpation on exam.  She will be given pain and nausea medication as well as IV fluids and will have labs and chest x-ray performed to evaluate for cause of her symptoms and she will be closely reassessed.  Independent labs interpretation:  The following labs were independently interpreted: at baseline, no acute abnormalities  Independent visualization of imaging: - I independently visualized the following imaging with scope of interpretation limited to determining acute life threatening conditions related to emergency care: CXR, which revealed LLL infiltrate concerning for pneumonia  Consultation: - Consulted or discussed management/test interpretation w/ external professional: N/A  Consideration for admission or further workup: Patient has no emergent conditions requiring admission or further work-up at this time and is stable for discharge home with primary care follow-up  Social Determinants of health: N/A    Amount and/or Complexity of Data Reviewed Labs: ordered. Radiology: ordered.  Risk Prescription drug management.          Final Clinical Impression(s) / ED Diagnoses Final diagnoses:  Pneumonia of left lower lobe due to infectious organism  Nausea    Rx / DC Orders ED Discharge Orders          Ordered    amoxicillin (AMOXIL) 500  MG  capsule  3 times daily        04/06/22 2158    ondansetron (ZOFRAN) 4 MG tablet  Every 6 hours        04/06/22 2158              Kemper Durie, DO 04/06/22 2200

## 2022-04-07 NOTE — Unmapped (Signed)
Call from pt @ 0740, states she went to local ER yesterday [facility]) and was diagnosed with pneumonia. Was prescribed Amoxicillin and Zofran. Told her this okay to take. Discussed importance of staying well hydrated even if not much of appetite. She is able to take in some food when takes Amoxicillin doses. She will continue to monitor symptoms and keep [place] posted if an changes. I'll have primary coord check in on local labs from Saturday and contact pt. If any issues.

## 2022-04-08 MED FILL — ENVARSUS XR 1 MG TABLET,EXTENDED RELEASE: ORAL | 30 days supply | Qty: 60 | Fill #10

## 2022-04-08 MED FILL — ENVARSUS XR 4 MG TABLET,EXTENDED RELEASE: ORAL | 30 days supply | Qty: 120 | Fill #10

## 2022-04-08 MED FILL — AZATHIOPRINE 50 MG TABLET: ORAL | 30 days supply | Qty: 60 | Fill #3

## 2022-04-11 LAB — TACROLIMUS LEVEL: Tacrolimus (FK506) - LabCorp: 7.9 ng/mL (ref 2.0–20.0)

## 2022-04-12 DIAGNOSIS — D708 Other neutropenia: Principal | ICD-10-CM

## 2022-04-17 LAB — DECEASED DONOR CL I&II, LOW RES
DONOR LOW RES DRW #2: 51
DONOR LOW RES HLA A #1: 30
DONOR LOW RES HLA A #2: 68
DONOR LOW RES HLA B #1: 72
DONOR LOW RES HLA B #2: 42
DONOR LOW RES HLA BW #1: 6
DONOR LOW RES HLA C #1: 2
DONOR LOW RES HLA C #2: 17
DONOR LOW RES HLA DQ #1: 5
DONOR LOW RES HLA DQ #2: 6
DONOR LOW RES HLA DR #1: 1
DONOR LOW RES HLA DR #2: 15

## 2022-04-22 DIAGNOSIS — Z940 Kidney transplant status: Principal | ICD-10-CM

## 2022-04-22 DIAGNOSIS — Z79899 Other long term (current) drug therapy: Principal | ICD-10-CM

## 2022-04-23 LAB — RENAL FUNCTION PANEL
ALBUMIN: 4.1 g/dL (ref 3.9–4.9)
BLOOD UREA NITROGEN: 24 mg/dL — ABNORMAL HIGH (ref 6–20)
BUN / CREAT RATIO: 14 (ref 9–23)
CALCIUM: 8.9 mg/dL (ref 8.7–10.2)
CHLORIDE: 110 mmol/L — ABNORMAL HIGH (ref 96–106)
CO2: 18 mmol/L — ABNORMAL LOW (ref 20–29)
CREATININE: 1.75 mg/dL — ABNORMAL HIGH (ref 0.57–1.00)
EGFR: 39 mL/min/{1.73_m2} — ABNORMAL LOW
GLUCOSE: 92 mg/dL (ref 70–99)
PHOSPHORUS, SERUM: 3.5 mg/dL (ref 3.0–4.3)
POTASSIUM: 5.2 mmol/L (ref 3.5–5.2)
SODIUM: 140 mmol/L (ref 134–144)

## 2022-04-23 LAB — CBC W/ DIFFERENTIAL
BANDED NEUTROPHILS ABSOLUTE COUNT: 0 10*3/uL (ref 0.0–0.1)
BASOPHILS ABSOLUTE COUNT: 0 10*3/uL (ref 0.0–0.2)
BASOPHILS RELATIVE PERCENT: 1 %
EOSINOPHILS ABSOLUTE COUNT: 0.5 10*3/uL — ABNORMAL HIGH (ref 0.0–0.4)
EOSINOPHILS RELATIVE PERCENT: 22 %
HEMATOCRIT: 30.4 % — ABNORMAL LOW (ref 34.0–46.6)
HEMOGLOBIN: 10.1 g/dL — ABNORMAL LOW (ref 11.1–15.9)
IMMATURE GRANULOCYTES: 1 %
LYMPHOCYTES ABSOLUTE COUNT: 0.6 10*3/uL — ABNORMAL LOW (ref 0.7–3.1)
LYMPHOCYTES RELATIVE PERCENT: 22 %
MEAN CORPUSCULAR HEMOGLOBIN CONC: 33.2 g/dL (ref 31.5–35.7)
MEAN CORPUSCULAR HEMOGLOBIN: 36.5 pg — ABNORMAL HIGH (ref 26.6–33.0)
MEAN CORPUSCULAR VOLUME: 110 fL — ABNORMAL HIGH (ref 79–97)
MONOCYTES ABSOLUTE COUNT: 0.3 10*3/uL (ref 0.1–0.9)
MONOCYTES RELATIVE PERCENT: 12 %
NEUTROPHILS ABSOLUTE COUNT: 1.1 10*3/uL — ABNORMAL LOW (ref 1.4–7.0)
NEUTROPHILS RELATIVE PERCENT: 42 %
PLATELET COUNT: 279 10*3/uL (ref 150–450)
RED BLOOD CELL COUNT: 2.77 x10E6/uL — ABNORMAL LOW (ref 3.77–5.28)
RED CELL DISTRIBUTION WIDTH: 14.5 % (ref 11.7–15.4)
WHITE BLOOD CELL COUNT: 2.5 10*3/uL — CL (ref 3.4–10.8)

## 2022-04-23 LAB — MAGNESIUM: MAGNESIUM: 1.7 mg/dL (ref 1.6–2.3)

## 2022-04-24 ENCOUNTER — Emergency Department: Admit: 2022-04-24 | Discharge: 2022-04-25 | Disposition: A | Discharge status: Home / Self Care | Payer: MEDICARE

## 2022-04-24 ENCOUNTER — Ambulatory Visit: Admit: 2022-04-24 | Discharge: 2022-04-25 | Disposition: A | Discharge status: Home / Self Care | Payer: MEDICARE

## 2022-04-24 LAB — CBC W/ AUTO DIFF
BASOPHILS ABSOLUTE COUNT: 0 10*9/L (ref 0.0–0.1)
BASOPHILS RELATIVE PERCENT: 0.6 %
EOSINOPHILS ABSOLUTE COUNT: 0.5 10*9/L (ref 0.0–0.5)
EOSINOPHILS RELATIVE PERCENT: 18.5 %
HEMATOCRIT: 29.7 % — ABNORMAL LOW (ref 34.0–44.0)
HEMOGLOBIN: 10.2 g/dL — ABNORMAL LOW (ref 11.3–14.9)
LYMPHOCYTES ABSOLUTE COUNT: 0.5 10*9/L — ABNORMAL LOW (ref 1.1–3.6)
LYMPHOCYTES RELATIVE PERCENT: 19.3 %
MEAN CORPUSCULAR HEMOGLOBIN CONC: 34.4 g/dL (ref 32.0–36.0)
MEAN CORPUSCULAR HEMOGLOBIN: 37.8 pg — ABNORMAL HIGH (ref 25.9–32.4)
MEAN CORPUSCULAR VOLUME: 109.8 fL — ABNORMAL HIGH (ref 77.6–95.7)
MEAN PLATELET VOLUME: 8 fL (ref 6.8–10.7)
MONOCYTES ABSOLUTE COUNT: 0.4 10*9/L (ref 0.3–0.8)
MONOCYTES RELATIVE PERCENT: 15 %
NEUTROPHILS ABSOLUTE COUNT: 1.3 10*9/L — ABNORMAL LOW (ref 1.8–7.8)
NEUTROPHILS RELATIVE PERCENT: 46.6 %
PLATELET COUNT: 245 10*9/L (ref 150–450)
RED BLOOD CELL COUNT: 2.71 10*12/L — ABNORMAL LOW (ref 3.95–5.13)
RED CELL DISTRIBUTION WIDTH: 15.3 % — ABNORMAL HIGH (ref 12.2–15.2)
WBC ADJUSTED: 2.8 10*9/L — ABNORMAL LOW (ref 3.6–11.2)

## 2022-04-24 LAB — COMPREHENSIVE METABOLIC PANEL
ALBUMIN: 4 g/dL (ref 3.4–5.0)
ALKALINE PHOSPHATASE: 43 U/L — ABNORMAL LOW (ref 46–116)
ALT (SGPT): 23 U/L (ref 10–49)
ANION GAP: 5 mmol/L (ref 5–14)
AST (SGOT): 25 U/L (ref <=34)
BILIRUBIN TOTAL: 0.7 mg/dL (ref 0.3–1.2)
BLOOD UREA NITROGEN: 37 mg/dL — ABNORMAL HIGH (ref 9–23)
BUN / CREAT RATIO: 19
CALCIUM: 9.3 mg/dL (ref 8.7–10.4)
CHLORIDE: 114 mmol/L — ABNORMAL HIGH (ref 98–107)
CO2: 22 mmol/L (ref 20.0–31.0)
CREATININE: 1.91 mg/dL — ABNORMAL HIGH
EGFR CKD-EPI (2021) FEMALE: 35 mL/min/{1.73_m2} — ABNORMAL LOW (ref >=60)
GLUCOSE RANDOM: 93 mg/dL (ref 70–179)
POTASSIUM: 4.6 mmol/L (ref 3.4–4.8)
PROTEIN TOTAL: 7.5 g/dL (ref 5.7–8.2)
SODIUM: 141 mmol/L (ref 135–145)

## 2022-04-24 LAB — SLIDE REVIEW

## 2022-04-24 LAB — LACTATE, VENOUS, WHOLE BLOOD: LACTATE BLOOD VENOUS: 0.6 mmol/L (ref 0.5–1.8)

## 2022-04-24 LAB — TACROLIMUS LEVEL: TACROLIMUS BLOOD: 5.1 ng/mL (ref 2.0–20.0)

## 2022-04-24 LAB — LIPASE: LIPASE: 32 U/L (ref 12–53)

## 2022-04-25 LAB — URINALYSIS WITH MICROSCOPY WITH CULTURE REFLEX
BACTERIA: NONE SEEN /HPF
BILIRUBIN UA: NEGATIVE
GLUCOSE UA: NEGATIVE
KETONES UA: NEGATIVE
LEUKOCYTE ESTERASE UA: NEGATIVE
NITRITE UA: NEGATIVE
PH UA: 5 (ref 5.0–9.0)
PROTEIN UA: NEGATIVE
RBC UA: <1 /HPF (ref <=4)
SPECIFIC GRAVITY UA: 1.015 (ref 1.003–1.030)
SQUAMOUS EPITHELIAL: 4 /HPF (ref 0–5)
UROBILINOGEN UA: <2
WBC UA: 1 /HPF (ref 0–5)

## 2022-04-25 LAB — PREGNANCY, URINE: PREGNANCY TEST URINE: NEGATIVE

## 2022-04-25 NOTE — Unmapped (Signed)
Patient here with abdominal pain, flank pain and diarrhea. Hx kidney and pancreas transplant in 2022

## 2022-04-25 NOTE — Unmapped (Signed)
ED Progress Note    Received sign out from previous provider.    Patient Summary: Elaine Koch is a 37 y.o. female with medical history of diabetes with history of renal and pancreatic transplant in 2022 currently on tacrolimus and azithromycin.  Patient also has a history of C. difficile infection.  Patient had recent pneumonia and was treated with amoxicillin in the outpatient setting.  Patient now is complaining of diarrhea for 4 days, complaining of 20 episodes per day.  Patient also with crampy abdominal pain starting today.  Laboratory workup thus far shows leukopenia consistent with baseline, anemia consistent with baseline.  Renal function mildly elevated from baseline with a creatinine of 1.91 from 1.7.  Action List:   Follow-up on CT abdomen/pelvis  Follow-up on renal ultrasound with Doppler  Dispo based on imaging and workup    Updates  ED Course as of 04/25/22 0451   Thu Apr 25, 2022   0000 US Renal Transplant W Doppler  No significant change compared with prior study. Stable mildly elevated resistive indices in the main renal transplant artery. Trace perinephric free fluid.     1610 CT Abdomen Pelvis Wo Contrast  --No acute abnormality in the abdomen or pelvis.     9604 I spoke with transplant coordinator.  They recommend CMV PCR.  Patient has had no diarrhea in this emergency department over the last 12 hours.  Vitally stable, labs at baseline other than mild elevation in creatinine from 1.75-1.91.  Will discharge patient home with stool collection kit so that she may recollect her diarrhea in the setting of return of diarrhea.   0451 Results and decision making discussed in depth with the patient and family at beside, if available. I discussed plan to discharge the patient and the important need for follow up. They agree with plan. I discussed strict return precautions with them, which were included in my discharge instructions, and they understand and agree to come back to the Emergency Department if their symptoms are persistent for a repeat exam in 8-12 hrs, or sooner if things change/worsen. They express understanding, and patient is discharged in stable condition.

## 2022-04-25 NOTE — Unmapped (Signed)
Pt is a kidney pancreas transplant pt march 2022 pt having diarrhea says its similar to when she has c-dif recent antibiotics for pneumonia

## 2022-04-25 NOTE — Unmapped (Signed)
[facility]  Emergency Department Provider Note    ED Clinical Impression     Final diagnoses:   Diarrhea, unspecified type (Primary)   Abdominal pain, unspecified abdominal location       HPI, ED Course, Assessment and Plan     Initial Clinical Impression:    April 24, 2022 8:10 PM   Elaine Koch is a 37 y.o. 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 Patient states she was recently treated on an outpatient basis for a PNA with amoxicillin and experienced relief in her symptoms, however several days after completing this course she noted profuse diarrhea approximately every 20 minutes overnight.  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  She states her symptoms at this time are similar to prior episodes of C. Diff and she is concerned that today she developed crampy, episoding abdominal pain that has traveled around her abdomen in association with this.  She has had no nausea, vomiting, fevers, cough, congestion, SOB, CP, or other concerns. She reports compliance with all of her medications and has had no problem tolerating PO intake at home.  She attended work today without issue however was back and forth to the restroom.  She states that she may have somewhat decreased urinary output.  No recent travel.     On initial evaluation, patient is in no acute distress, is nontoxic appearing and vital signs are reassuring.  Her abdominal exam is notable for mild tenderness throughout with worse pain per report in RLQ without rebound or guarding.       BP 129/79  - Pulse 90  - Temp 37 ??C (98.6 ??F) (Oral)  - Resp 16  - Wt 81.6 kg (180 lb)  - LMP  (LMP Unknown) Comment: irregular - SpO2 99%  - BMI 29.95 kg/m??     Medical Decision Making  This patient presents with profuse diarrhea in the setting of recent antibiotic treatment, history of C. Diff, and immunocompromise.  Differential includes but is not limited to C diff, early gastroenteritis, other intraabdominal pathology such as appendicits, diverticulitis, or transplant related complication, side effect from medications, or atypical infectious cause of diarrhea given immunocompromise.  Diarrhea is nonbloody so lower concern for shiga toxin-related diarrhea or inflammatory bowel disease.  Considered, but think unlikely SBO, secondary cuases of diarrhea such as adrenal crisis, thyrotoxicosis, sepsis, hyperadrenergic state.  Plan to obtain abdominal pain labs, UPT, UA, CT abdomen pelvis given RLQ pain, US renal transplant protocol and evaluate for possible transplant-related complications.  Will obtain C diff and GI pathogen panel as well.     Patient has had no further stool output while here in the department yet. Creatinine baseline near 1.75 and today 1.91 not representative of AKI.  Remainder of results reassuring and at baseline for the patient.  Patient handed off at 2100 pending imaging studies. Disposition will be pending the results of these and GIPP.      Further ED updates and updates to plan as per ED Course below:    ED Course:         Independent Interpretation of Studies: I have independently interpreted the following studies:  EKG, US renal transplant and CT abd pelvis without contrast pending at time of signout    Discussion of Management With Other  Providers or Support Staff: I discussed the management of this patient with the:  Pending results of imaging studies.    Considerations Regarding Disposition/Escalation of Care and Critical Care:  Pending results of imaging studies though patient well-appearing and anticipate she may be appropriate for outpatient follow up if imaging and lab studies do not raise acute concerns.     Social Determinants of Health with Concerns     Financial Resource Strain: Medium Risk (03/30/2018)    Overall Financial Resource Strain (CARDIA)     Difficulty of Paying Living Expenses: Somewhat hard   Internet Connectivity: Not on file   Food Insecurity: Not on file   Housing/Utilities: Not on file   Alcohol Use: Not on file   Transportation Needs: Not on file   Substance Use: Not on file   Health Literacy: Not on file   Physical Activity: Insufficiently Active (04/20/2019)    Exercise Vital Sign     Days of Exercise per Week: 2 days     Minutes of Exercise per Session: 30 min   Interpersonal Safety: Not on file   Intimate Partner Violence: Unknown (04/20/2019)    Humiliation, Afraid, Rape, and Kick questionnaire     Fear of Current or Ex-Partner: Patient declined     Emotionally Abused: Patient declined     Physically Abused: Patient declined     Sexually Abused: Patient declined   Social Connections: Unknown (04/20/2019)    Social Connection and Isolation Panel [NHANES]     Frequency of Communication with Friends and Family: Patient declined     Frequency of Social Gatherings with Friends and Family: Patient declined     Attends Religious Services: Patient declined     Active Member of Clubs or Organizations: Patient declined     Attends Banker Meetings: Patient declined     Marital Status: Patient declined     _____________________________________________________________________    The case was discussed with the attending physician who is in agreement with the above assessment and plan    Past History     PAST MEDICAL HISTORY/PAST SURGICAL HISTORY:   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       Past Surgical History:   Procedure Laterality Date    AV FISTULA PLACEMENT  2018    CESAREAN SECTION PR COLONOSCOPY FLX DX W/COLLJ SPEC WHEN PFRMD N/A 02/14/2020    Procedure: COLONOSCOPY, FLEXIBLE, PROXIMAL TO SPLENIC FLEXURE; DIAGNOSTIC, W/WO COLLECTION SPECIMEN BY BRUSH OR WASH;  Surgeon: Carmon Ginsberg, MD;  Location: [facility];  Service: Gastroenterology    PR COLONOSCOPY FLX DX W/COLLJ SPEC WHEN PFRMD N/A 06/19/2020    Procedure: COLONOSCOPY, FLEXIBLE, PROXIMAL TO SPLENIC FLEXURE; DIAGNOSTIC, W/WO COLLECTION SPECIMEN BY BRUSH OR WASH;  Surgeon: Carmon Ginsberg, MD;  Location: GI PROC[facility]URES [facility];  Service: Gastroenterology    PR FECAL MICROBIOTA PREP INSTIL N/A 02/14/2020    Procedure: PREP W INSTILLATION FECAL MICROBIOTA, ANY METHOD;  Surgeon: Carmon Ginsberg, MD;  Location: GI [facility];  Service: Gastroenterology    PR INDUCED ABORTN BY DIL/EVAC N/A 03/20/2021    Procedure: DILATION AND EVACUATION - INDUCED;  Surgeon: Gaynelle Cage, MD;  Location: [facility];  Service: Family Planning    PR LAP,DIAGNOSTIC ABDOMEN N/A 11/17/2020    Procedure: DIAGNOSTIC AND OR OPERATIVE LAPAROSCOPY;  Surgeon: Estil Daft, MD;  Location: MAIN OR [facility];  Service: Womens Primary Gynecology    PR PREPARE FECAL MICROBIOTA FOR INSTILLATION  06/19/2020    Procedure: PREP FECAL MICROBIOTA FOR INSTILLATION, INCLUDING ASSESSMENT OF DONOR SPECIMEN;  Surgeon: Carmon Ginsberg, MD;  Location: [facility];  Service: Gastroenterology    PR SURG RX MISSED ABORTN,1ST TRI N/A 11/17/2020    Procedure: VACUUM ASPIRATION;  Surgeon: Estil Daft, MD;  Location: MAIN OR [facility];  Service: [facility] Primary Gynecology    PR TRANSPLANT ALLOGRAFT PANCREAS N/A 05/03/2019    Procedure: TRANSPLANTATION OF PANCREATIC ALLOGRAFT;  Surgeon: Leona Carry, MD;  Location: MAIN OR [facility];  Service: Transplant    PR TRANSPLANT,PREP CADAVER RENAL GRAFT N/A 05/03/2019    Procedure: [facility] STD PREP CAD DONR RENAL ALLOGFT PRIOR TO TRNSPLNT, INCL DISSEC/REM PERINEPH FAT, DIAPH/RTPER ATTAC;  Surgeon: Leona Carry, MD;  Location: MAIN OR [facility];  Service: Transplant    PR TRANSPLANT,PREP DONOR PANCREAS N/A 05/03/2019    Procedure: BACKBENCH STANDARD PREPARATION OF CADAVER DONOR PANCREAS ALLOGRAFT PRIOR TO TRANSPLANTATION;  Surgeon: Leona Carry, MD;  Location: MAIN OR [facility];  Service: Transplant    PR TRANSPLANTATION OF KIDNEY N/A 05/03/2019    Procedure: RENAL ALLOTRANSPLANTATION, IMPLANTATION OF GRAFT; WITHOUT RECIPIENT NEPHRECTOMY;  Surgeon: Leona Carry, MD;  Location: MAIN OR [facility];  Service: Transplant    TONSILLECTOMY         MEDICATIONS:   No current facility-administered medications for this encounter.    Current Outpatient Medications:     aspirin (ECOTRIN) 81 MG tablet, Take 1 tablet (81 mg total) by mouth daily., Disp: 30 tablet, Rfl: 11    azathioprine (IMURAN) 50 mg tablet, Take 2 tablets (100 mg total) by mouth daily, Disp: 60 tablet, Rfl: 11    multivitamin, prenatal, folic acid-iron, 29 mg iron- 1 mg, Take 1 tablet by mouth daily., Disp: 90 tablet, Rfl: 5    NIFEdipine (PROCARDIA XL) 30 MG 24 hr tablet, Take 1 tablet (30 mg total) by mouth daily., Disp: 90 tablet, Rfl: 3    sodium bicarbonate 650 mg tablet, Take 2 tablets (1,300 mg total) by mouth Three (3) times a day., Disp: 180 tablet, Rfl: 3    tacrolimus (ENVARSUS XR) 1 mg Tb24 extended release tablet, Take 2 tablets (2 mg total) by mouth daily. Take  2 tablets ( 2 mg total) in addition to 4 tablets ( 16 mg total)  for a total daily dose of 18 mg daily, Disp: 60 tablet, Rfl: 11    tacrolimus (ENVARSUS XR) 1 mg Tb24 extended release tablet, Take 2 tablets (2 mg total) by mouth in the morning. Take FOUR 4 mg tablets (16 mg total) and TWO 1 mg tablet (2 mg total) for a total dose of 18 mg daily., Disp: 60 tablet, Rfl: 11    tacrolimus (ENVARSUS XR) 4 mg Tb24 extended release tablet, Take 4 tablets (16 mg total) by mouth daily. Take FOUR 4 mg tablets (16 mg total) and TWO 1 mg tablet (2 mg total) for a total dose of 18 mg daily, Disp: 120 tablet, Rfl: 11    ALLERGIES:   Iodinated contrast media, Nickel, Propranolol, Eye irrigating solution [ophthalmic irrigation solution], Iodine, Naltrexone, and Uni-cortrom    SOCIAL HISTORY:   Social  History     Tobacco Use    Smoking status: Never    Smokeless tobacco: Never   Substance Use Topics    Alcohol use: Never       FAMILY HISTORY:  Family History   Problem Relation Age of Onset    Diabetes Mother     Diabetes Father     Cancer Maternal Grandmother     Diabetes Maternal Grandfather     Diabetes Paternal Grandmother           Review of Systems     A review of systems was performed and relevant portions were as noted above in HPI     Physical Exam     VITAL SIGNS:    BP 129/79  - Pulse 90  - Temp 37 ??C (98.6 ??F) (Oral)  - Resp 16  - Wt 81.6 kg (180 lb)  - LMP  (LMP Unknown) Comment: irregular - SpO2 99%  - BMI 29.95 kg/m??     Constitutional:   Alert and oriented.   Head:   Normocephalic and atraumatic  Eyes:   Conjunctivae are normal, EOMI, PERRL  ENT:   No notable congestion, Mucous membranes moist, External ears normal, no notable stridor  Cardiovascular:   Rate as vitals above. Appears warm and well perfused  Respiratory:   Normal respiratory effort. Breath sounds are normal.  Gastrointestinal:   Soft, non-distended, and moderately tender in RLQ, mildly tender throughout without rebound or guarding.   Genitourinary:   Deferred  Musculoskeletal:    Normal range of motion in all extremities. No tenderness or edema noted in B/L lower extremities  Neurologic:   No gross focal neurologic deficits beyond baseline are appreciated.  Skin:   Skin is warm, dry and intact.       Radiology     CT Abdomen Pelvis Wo Contrast   Final Result   --No acute abnormality in the abdomen or pelvis.      --Other incidental or chronic findings, as above.      ====================   MODIFIED REPORT:   (04/25/2022 5:53 AM)   This report has been modified from its preliminary version; you may check the prior versions of radiology report, results history link for prior report versions (if they were previously visible in Epic).      -----------------------------------------------      US Renal Transplant W Doppler   Preliminary Result   No significant change compared with prior study. Stable mildly elevated resistive indices in the main renal transplant artery. Trace perinephric free fluid.         Please see below for data measurements:      Transplant location: LLQ       Renal Transplant: Sagittal 10.7    cm; AP   4.4  cm; Transverse   5.2  cm      Segmental artery superior resistive index: 0.79    Segmental artery mid resistive index: 0.68    Segmental artery inferior resistive index: 0.69       Previous resistive indices range of segmental arteries: 0.79-0.81      Main renal artery peak systolic velocity at anastomosis:   2.4  m/s   Main renal artery hilum resistive index:  0.80     Main renal artery mid resistive index:  0.83    Main renal artery anastomosis resistive index: 0.88       Previous resistive indices range of main renal artery: 0.79-0.88      Main  renal vein: patent      Iliac artery: Patent   Iliac vein: Patent      Bladder volume prevoid:  212.7    mL                   Labs     Labs Reviewed   COMPREHENSIVE METABOLIC PANEL - Abnormal; Notable for the following components:       Result Value    Chloride 114 (*)     BUN 37 (*)     Creatinine 1.91 (*)     eGFR CKD-EPI (2021) Female 35 (*)     Alkaline Phosphatase 43 (*)     All other components within normal limits   URINALYSIS WITH MICROSCOPY WITH CULTURE REFLEX - Abnormal; Notable for the following components:    Blood, UA Trace (*)     Mucus, UA Rare (*)     All other components within normal limits   CBC W/ AUTO DIFF - Abnormal; Notable for the following components:    WBC 2.8 (*)     RBC 2.71 (*)     HGB 10.2 (*)     HCT 29.7 (*)     MCV 109.8 (*)     MCH 37.8 (*)     RDW 15.3 (*)     Absolute Neutrophils 1.3 (*)     Absolute Lymphocytes 0.5 (*)     Macrocytosis Slight (*)     All other components within normal limits   SLIDE REVIEW - Abnormal; Notable for the following components:    Smear Review Comments See Comment (*)     Giant Platelets Present (*)     Hypersegmented Neutrophils Present (*)     Poikilocytosis Moderate (*)     All other components within normal limits   LIPASE - Normal   PREGNANCY, URINE - Normal   LACTATE, VENOUS, WHOLE BLOOD - Normal   CLOSTRIDIUM DIFFICILE ASSAY   GI PATHOGEN PANEL   CBC W/ DIFFERENTIAL    Narrative:     The following orders were created for panel order CBC w/ Differential.                  Procedure                               Abnormality         Status                                     ---------                               -----------         ------                                     CBC w/ [id number]         Abnormal            Final result                               Morphology Review[[id number]]  Abnormal            Final result                                                 Please view results for these tests on the individual orders.   CMV DNA, QUANTITATIVE, PCR         Pertinent labs & imaging results that were available during my care of the patient were reviewed by me and considered in my medical decision making (see chart for details).    Please note- This chart has been created using [employer]. Chart creation errors have been sought, but may not always be located and such creation errors, especially pronoun confusion, do NOT reflect on the standard of medical care.       Ripley Fraise, MD  Resident  04/25/22 [phone]

## 2022-04-26 LAB — CMV DNA, QUANTITATIVE, PCR: CMV VIRAL LD: NOT DETECTED

## 2022-04-29 DIAGNOSIS — Z79899 Other long term (current) drug therapy: Principal | ICD-10-CM

## 2022-04-29 DIAGNOSIS — Z940 Kidney transplant status: Principal | ICD-10-CM

## 2022-05-03 ENCOUNTER — Other Ambulatory Visit: Payer: Self-pay

## 2022-05-03 ENCOUNTER — Encounter (HOSPITAL_COMMUNITY): Payer: Self-pay

## 2022-05-03 ENCOUNTER — Emergency Department (HOSPITAL_COMMUNITY)
Admission: EM | Admit: 2022-05-03 | Discharge: 2022-05-03 | Disposition: A | Discharge status: Home / Self Care | Payer: Medicare Other | Attending: Emergency Medicine | Admitting: Emergency Medicine

## 2022-05-03 DIAGNOSIS — R197 Diarrhea, unspecified: Secondary | ICD-10-CM

## 2022-05-03 DIAGNOSIS — E1022 Type 1 diabetes mellitus with diabetic chronic kidney disease: Secondary | ICD-10-CM | POA: Diagnosis not present

## 2022-05-03 DIAGNOSIS — Z7982 Long term (current) use of aspirin: Secondary | ICD-10-CM | POA: Diagnosis not present

## 2022-05-03 DIAGNOSIS — Z794 Long term (current) use of insulin: Secondary | ICD-10-CM | POA: Diagnosis not present

## 2022-05-03 DIAGNOSIS — D849 Immunodeficiency, unspecified: Secondary | ICD-10-CM | POA: Insufficient documentation

## 2022-05-03 DIAGNOSIS — Z7984 Long term (current) use of oral hypoglycemic drugs: Secondary | ICD-10-CM | POA: Diagnosis not present

## 2022-05-03 DIAGNOSIS — Z79899 Other long term (current) drug therapy: Secondary | ICD-10-CM | POA: Insufficient documentation

## 2022-05-03 DIAGNOSIS — I120 Hypertensive chronic kidney disease with stage 5 chronic kidney disease or end stage renal disease: Secondary | ICD-10-CM | POA: Diagnosis not present

## 2022-05-03 DIAGNOSIS — Z992 Dependence on renal dialysis: Secondary | ICD-10-CM | POA: Insufficient documentation

## 2022-05-03 DIAGNOSIS — N186 End stage renal disease: Secondary | ICD-10-CM | POA: Insufficient documentation

## 2022-05-03 HISTORY — DX: Enterocolitis due to Clostridium difficile, not specified as recurrent: A04.72

## 2022-05-03 LAB — COMPREHENSIVE METABOLIC PANEL
ALT: 27 U/L (ref 0–44)
AST: 29 U/L (ref 15–41)
Albumin: 4 g/dL (ref 3.5–5.0)
Alkaline Phosphatase: 44 U/L (ref 38–126)
Anion gap: 5 (ref 5–15)
BUN: 35 mg/dL — ABNORMAL HIGH (ref 6–20)
CO2: 20 mmol/L — ABNORMAL LOW (ref 22–32)
Calcium: 9.1 mg/dL (ref 8.9–10.3)
Chloride: 115 mmol/L — ABNORMAL HIGH (ref 98–111)
Creatinine, Ser: 1.88 mg/dL — ABNORMAL HIGH (ref 0.44–1.00)
GFR, Estimated: 36 mL/min — ABNORMAL LOW (ref >60)
Glucose, Bld: 101 mg/dL — ABNORMAL HIGH (ref 70–99)
Potassium: 5 mmol/L (ref 3.5–5.1)
Sodium: 140 mmol/L (ref 135–145)
Total Bilirubin: 0.9 mg/dL (ref 0.3–1.2)
Total Protein: 7.4 g/dL (ref 6.5–8.1)

## 2022-05-03 LAB — CBC WITH DIFFERENTIAL/PLATELET
Abs Immature Granulocytes: 0.04 10*3/uL (ref 0.00–0.07)
Basophils Absolute: 0 10*3/uL (ref 0.0–0.1)
Basophils Relative: 1 %
Eosinophils Absolute: 1.2 10*3/uL — ABNORMAL HIGH (ref 0.0–0.5)
Eosinophils Relative: 30 %
HCT: 29.3 % — ABNORMAL LOW (ref 36.0–46.0)
Hemoglobin: 9.9 g/dL — ABNORMAL LOW (ref 12.0–15.0)
Immature Granulocytes: 1 %
Lymphocytes Relative: 12 %
Lymphs Abs: 0.5 10*3/uL — ABNORMAL LOW (ref 0.7–4.0)
MCH: 36.7 pg — ABNORMAL HIGH (ref 26.0–34.0)
MCHC: 33.8 g/dL (ref 30.0–36.0)
MCV: 108.5 fL — ABNORMAL HIGH (ref 80.0–100.0)
Monocytes Absolute: 0.3 10*3/uL (ref 0.1–1.0)
Monocytes Relative: 6 %
Neutro Abs: 2.1 10*3/uL (ref 1.7–7.7)
Neutrophils Relative %: 50 %
Platelets: 234 10*3/uL (ref 150–400)
RBC: 2.7 MIL/uL — ABNORMAL LOW (ref 3.87–5.11)
RDW: 13.7 % (ref 11.5–15.5)
WBC: 4.2 10*3/uL (ref 4.0–10.5)
nRBC: 0 % (ref 0.0–0.2)

## 2022-05-03 LAB — GASTROINTESTINAL PANEL BY PCR, STOOL (REPLACES STOOL CULTURE)

## 2022-05-03 LAB — I-STAT BETA HCG BLOOD, ED (MC, WL, AP ONLY): I-stat hCG, quantitative: <5 m[IU]/mL (ref <5)

## 2022-05-03 LAB — C DIFFICILE QUICK SCREEN W PCR REFLEX
C Diff antigen: NEGATIVE
C Diff interpretation: NOT DETECTED
C Diff toxin: NEGATIVE

## 2022-05-03 LAB — LIPASE, BLOOD: Lipase: 33 U/L (ref 11–51)

## 2022-05-03 MED ORDER — LOPERAMIDE HCL 2 MG PO CAPS: 2.0000 mg | ORAL_CAPSULE | Freq: Four times a day (QID) | ORAL | 0 refills | Status: AC | PRN

## 2022-05-03 MED ORDER — MORPHINE SULFATE (PF) 2 MG/ML IV SOLN
2.0000 mg | Freq: Once | INTRAVENOUS | Status: AC
  Administered 2022-05-03: 2 mg via INTRAVENOUS
  Filled 2022-05-03: qty 1

## 2022-05-03 MED ORDER — LOPERAMIDE HCL 2 MG PO CAPS
4.0000 mg | ORAL_CAPSULE | Freq: Once | ORAL | Status: AC
  Administered 2022-05-03: 4 mg via ORAL
  Filled 2022-05-03: qty 2

## 2022-05-03 MED ORDER — LACTATED RINGERS IV BOLUS
1000.0000 mL | Freq: Once | INTRAVENOUS | Status: AC
  Administered 2022-05-03: 1000 mL via INTRAVENOUS

## 2022-05-03 MED ORDER — DICYCLOMINE HCL 10 MG PO CAPS
10.0000 mg | ORAL_CAPSULE | Freq: Once | ORAL | Status: AC
  Administered 2022-05-03: 10 mg via ORAL
  Filled 2022-05-03: qty 1

## 2022-05-03 NOTE — Unmapped (Signed)
[facility] Clinical Assessment & Refill Coordination Note    Elaine Koch, Hope: 1985-12-31  Phone: [phone] (home)     All above HIPAA information was verified with patient.     Was a Nurse, learning disability used for this call? No    Specialty Medication(s):   Transplant: Envarsus 1mg , Envarsus 4mg , and azathioprine 50mg      Current Outpatient Medications   Medication Sig Dispense Refill    aspirin (ECOTRIN) 81 MG tablet Take 1 tablet (81 mg total) by mouth daily. 30 tablet 11    azathioprine (IMURAN) 50 mg tablet Take 2 tablets (100 mg total) by mouth daily 60 tablet 11    multivitamin, prenatal, folic acid-iron, 29 mg iron- 1 mg Take 1 tablet by mouth daily. 90 tablet 5    NIFEdipine (PROCARDIA XL) 30 MG 24 hr tablet Take 1 tablet (30 mg total) by mouth daily. 90 tablet 3    sodium bicarbonate 650 mg tablet Take 2 tablets (1,300 mg total) by mouth Three (3) times a day. 180 tablet 3    tacrolimus (ENVARSUS XR) 1 mg Tb24 extended release tablet Take 2 tablets (2 mg total) by mouth daily. Take  2 tablets ( 2 mg total) in addition to 4 tablets ( 16 mg total)  for a total daily dose of 18 mg daily 60 tablet 11    tacrolimus (ENVARSUS XR) 1 mg Tb24 extended release tablet Take 2 tablets (2 mg total) by mouth in the morning. Take FOUR 4 mg tablets (16 mg total) and TWO 1 mg tablet (2 mg total) for a total dose of 18 mg daily. 60 tablet 11    tacrolimus (ENVARSUS XR) 4 mg Tb24 extended release tablet Take 4 tablets (16 mg total) by mouth daily. Take FOUR 4 mg tablets (16 mg total) and TWO 1 mg tablet (2 mg total) for a total dose of 18 mg daily 120 tablet 11     No current facility-administered medications for this visit.        Changes to medications: Elaine Koch reports no changes at this time.    Allergies   Allergen Reactions    Iodinated Contrast Media Other (See Comments), Rash and Swelling     Burning, warmth throughout body and tingling.  Throat swelling.  Treated with benadryl and symptoms improved.  Has not had contrast since then (2013 or 2014).    Burning    Nickel Rash    Propranolol Swelling and Other (See Comments)    Eye Irrigating Solution [Ophthalmic Irrigation Solution] Other (See Comments)     Contrast dye (name?) used in eyes caused hot feeling in face, reversed with Benadryl.     Iodine      Other reaction(s): Skin Rash    Naltrexone Rash    Uni-Cortrom Rash       Changes to allergies: No    SPECIALTY MEDICATION ADHERENCE     Envarsus 1mg   : 8 days of medicine on hand   Envarsus 4mg   : 8 days of medicine on hand   Azathioprine 50mg   : 8 days of medicine on hand       Medication Adherence    Patient reported X missed doses in the last month: 0  Specialty Medication: azathioprine 50 mg tablet (IMURAN)  Patient is on additional specialty medications: Yes  Additional Specialty Medications: ENVARSUS XR 1 mg Tb24 extended release tablet (tacrolimus)  Patient Reported Additional Medication X Missed Doses in the Last Month: 0  Patient is on more than two specialty medications: Yes  Specialty Medication: ENVARSUS XR 4 mg Tb24 extended release tablet (tacrolimus)  Patient Reported Additional Medication X Missed Doses in the Last Month: 0          Specialty medication(s) dose(s) confirmed: Regimen is correct and unchanged.     Are there any concerns with adherence? No    Adherence counseling provided? Not needed    CLINICAL MANAGEMENT AND INTERVENTION      Clinical Benefit Assessment:    Do you feel the medicine is effective or helping your condition? Yes    Clinical Benefit counseling provided? Not needed    Adverse Effects Assessment:    Are you experiencing any side effects? No    Are you experiencing difficulty administering your medicine? No    Quality of Life Assessment:    Quality of Life    Rheumatology  Oncology  Dermatology  Cystic Fibrosis          How many days over the past month did your transplant  keep you from your normal activities? For example, brushing your teeth or getting up in the morning. 0    Have you discussed this with your provider? Not needed    Acute Infection Status:    Acute infections noted within Epic:  Rule Out C. Diff  Patient reported infection: None    Therapy Appropriateness:    Is therapy appropriate and patient progressing towards therapeutic goals? Yes, therapy is appropriate and should be continued    DISEASE/MEDICATION-SPECIFIC INFORMATION      N/A    Solid Organ Transplant: Not Applicable    PATIENT SPECIFIC NEEDS     Does the patient have any physical, cognitive, or cultural barriers? No    Is the patient high risk? No    Did the patient require a clinical intervention? No    Does the patient require physician intervention or other additional services (i.e., nutrition, smoking cessation, social work)? No    SOCIAL DETERMINANTS OF HEALTH     At the [facility], we have learned that life circumstances - like trouble affording food, housing, utilities, or transportation can affect the health of many of our patients.   That is why we wanted to ask: are you currently experiencing any life circumstances that are negatively impacting your health and/or quality of life? No    Social Determinants of Health     Financial Resource Strain: Medium Risk (03/30/2018)    Overall Financial Resource Strain (CARDIA)     Difficulty of Paying Living Expenses: Somewhat hard   Internet Connectivity: Not on file   Food Insecurity: Not on file   Tobacco Use: Low Risk  (04/06/2022)    Received from [facility]    Patient History     Smoking Tobacco Use: Never     Smokeless Tobacco Use: Never     Passive Exposure: Not on file   Housing/Utilities: Not on file   Alcohol Use: Not on file   Transportation Needs: Not on file   Substance Use: Not on file   Health Literacy: Not on file   Physical Activity: Insufficiently Active (04/20/2019)    Exercise Vital Sign     Days of Exercise per Week: 2 days     Minutes of Exercise per Session: 30 min   Interpersonal Safety: Not on file Stress: No Stress Concern Present (04/20/2019)    [employer] of Occupational Health - Occupational Stress Questionnaire  Feeling of Stress : Only a little   Intimate Partner Violence: Unknown (04/20/2019)    Humiliation, Afraid, Rape, and Kick questionnaire     Fear of Current or Ex-Partner: Patient declined     Emotionally Abused: Patient declined     Physically Abused: Patient declined     Sexually Abused: Patient declined   Depression: Not at risk (01/10/2022)    Received from [facility]    PHQ-2     Patient Health Questionnaire-2 Score: 0   Social Connections: Unknown (04/20/2019)    Social Connection and Isolation Panel [NHANES]     Frequency of Communication with Friends and Family: Patient declined     Frequency of Social Gatherings with Friends and Family: Patient declined     Attends Religious Services: Patient declined     Database administrator or Organizations: Patient declined     Attends Banker Meetings: Patient declined     Marital Status: Patient declined       Would you be willing to receive help with any of the needs that you have identified today? Not applicable       SHIPPING     Specialty Medication(s) to be Shipped:   Transplant: Envarsus 1mg , Envarsus 4mg , and azathioprine 50mg     Other medication(s) to be shipped:  aspirin     Changes to insurance: No    Patient was informed of new phone menu: Yes    Delivery Scheduled: Yes, Expected medication delivery date: 05/07/2022.     Medication will be delivered via UPS to the confirmed prescription address in [facility]    The patient will receive a drug information handout for each medication shipped and additional FDA Medication Guides as required.  Verified that patient has previously received a Conservation officer, historic buildings and a Surveyor, mining.    The patient or caregiver noted above participated in the development of this care plan and knows that they can request review of or adjustments to the care plan at any time.      All of the patient's questions and concerns have been addressed.    Thad Ranger, PharmD   [facility] Specialty Pharmacist

## 2022-05-03 NOTE — ED Notes (Signed)
2nd attempt to collect urine sample patient says she still does not need to urinate.

## 2022-05-03 NOTE — Discharge Instructions (Signed)
You were seen in the emergency department today for diarrhea.  You do not have C. difficile.  Your labs are reassuring.  I am referring you to a gastroenterologist here in [facility] for you to have ongoing workup of your symptoms.  I am also prescribing you loperamide also known as Imodium that you can take 4 times a day for diarrhea or loose stools.  If you persistently have diarrhea despite using Imodium and are unable to drink water please return to the emergency department.

## 2022-05-03 NOTE — ED Notes (Signed)
Pt provided with ginger ale 

## 2022-05-03 NOTE — ED Notes (Signed)
Patient aware we need a urine sample and she says she is unable urinate at this time.

## 2022-05-03 NOTE — ED Provider Notes (Signed)
[facility] [place] EMERGENCY DEPARTMENT AT [facility] Provider Note   CSN: [id number] Arrival date & time: 05/03/22  0745     History  Chief Complaint  Patient presents with   Diarrhea    Christine Reed is a 37 y.o. female.  With past medical history of type 1 diabetes, hypertension, end-stage renal disease s/p renal and pancreatic transplant on tacrolimus who presents to the emergency department with diarrhea.  States she began having diarrhea last night. Has had persistent diarrhea until this morning. States easily >10 episodes of diarrhea. She describes it as watery and non-bloody. She is having lower abdominal and low back cramping. She feels bloated. States that she was on amoxicillin about 3 weeks ago after having pneumonia and at that time had about 3 days of persistent diarrhea which spontaneously resolved. She went to [facility] on 04/24/22 for these symptoms. She states they told her she had UTI, but patient did not feel she had urinary symptoms and left. She is denying having nausea or vomiting, fever, dysuria, hematuria, vaginal discharge. She does have a history of two episodes of clostridium difficile, both requiring fecal transplant after failed vancomycin therapy.   On chart review, seen at [facility] on 04/24/22, she had CT AP and US Renal showing no abnormalities. They obtained labs including CMV which was negative. Appears she did not have an episode of diarrhea in the ED so were unable to obtain a GIPP or Cdiff testing. Appears she was discharge with stool collection kit and return precautions.   Diarrhea Associated symptoms: abdominal pain        Home Medications Prior to Admission medications   Medication Sig Start Date End Date Taking? Authorizing Provider  loperamide (IMODIUM) 2 MG capsule Take 1 capsule (2 mg total) by mouth 4 (four) times daily as needed for diarrhea or loose stools. 05/03/22  Yes Mickie Hillier, PA-C  amLODipine (NORVASC) 10 MG tablet Take 1 tablet (10 mg  total) by mouth daily. 01/07/19   Aline August, MD  aspirin EC 81 MG tablet Take 81 mg by mouth daily. Swallow whole.    [provider]  azaTHIOprine (IMURAN) 50 MG tablet Take 50 mg by mouth in the morning and at bedtime.    [provider]  Biotin 1000 MCG CHEW Chew by mouth.    [provider]  glucagon (GLUCAGON EMERGENCY) 1 MG injection Inject 1 mg into the skin once as needed for up to 1 dose. Inject 1mg  into skin once as needed for low blood sugar. 06/03/17   Elayne Snare, MD  insulin aspart (NOVOLOG) 100 UNIT/ML injection Inject 0-20 Units into the skin 3 (three) times daily before meals. Sliding scale 09/10/18   Elayne Snare, MD  Insulin Detemir (LEVEMIR FLEXTOUCH) 100 UNIT/ML Pen INJECT 10 UNITS INTO THE SKIN IN THE MORNING AND INJECT 11 UNITS INTO THE SKIN IN THE EVENING. Patient taking differently: Inject 10-11 Units into the skin See admin instructions. Inject 10 units in the morning and 11 units in the evening. 09/10/18   Elayne Snare, MD  losartan (COZAAR) 50 MG tablet Take 50 mg by mouth at bedtime. 12/11/18   [provider]  metoprolol succinate (TOPROL-XL) 50 MG 24 hr tablet Take 50 mg by mouth daily. 07/22/18   [provider]  metroNIDAZOLE (FLAGYL) 500 MG tablet Take 500 mg by mouth 2 (two) times daily. 02/16/19   [provider]  ondansetron (ZOFRAN) 4 MG tablet Take 1 tablet (4 mg total) by  mouth every 6 (six) hours. 04/06/22   Kemper Durie, DO  promethazine-dextromethorphan (PROMETHAZINE-DM) 6.25-15 MG/5ML syrup Take 5 mLs by mouth 4 (four) times daily as needed for cough. 02/23/19   Pisciotta, Elmyra Ricks, PA-C  sevelamer (RENAGEL) 800 MG tablet Take 2,400 mg by mouth See admin instructions. Take 2400mg  three times daily with each meal and snacks 04/10/17   [provider]  sodium bicarbonate 650 MG tablet Take 1,300 mg by mouth 3 (three) times daily.    [provider]  Tacrolimus ER (ENVARSUS XR) 4 MG TB24  Take 16 mg by mouth.    [provider]  buPROPion (WELLBUTRIN XL) 150 MG 24 hr tablet Take 150 mg daily by mouth.  11/10/18  [provider]      Allergies    Ivp dye [iodinated contrast media], Propranolol hcl er, Iodine, and Nickel    Review of Systems   Review of Systems  Gastrointestinal:  Positive for abdominal pain and diarrhea.  All other systems reviewed and are negative.   Physical Exam Updated Vital Signs BP (!) 146/82   Pulse 80   Temp 98.2 F (36.8 C) (Oral)   Resp 17   Ht 5\' 5"  (1.651 m)   Wt 81.6 kg   SpO2 100%   BMI 29.94 kg/m  Physical Exam Vitals and nursing note reviewed.  Constitutional:      General: She is not in acute distress.    Appearance: Normal appearance. She is normal weight. She is not ill-appearing or toxic-appearing.  HENT:     Head: Normocephalic.     Mouth/Throat:     Mouth: Mucous membranes are moist.     Pharynx: Oropharynx is clear.  Eyes:     General: No scleral icterus.    Extraocular Movements: Extraocular movements intact.  Cardiovascular:     Rate and Rhythm: Normal rate and regular rhythm.     Pulses: Normal pulses.          Radial pulses are 2+ on the right side and 2+ on the left side.     Heart sounds: Murmur heard.     Systolic murmur is present.  Pulmonary:     Effort: Pulmonary effort is normal.     Breath sounds: Normal breath sounds.  Abdominal:     General: Abdomen is flat. Bowel sounds are normal. There is no distension.     Palpations: Abdomen is soft. There is no mass.     Tenderness: There is generalized abdominal tenderness. There is no guarding.  Skin:    General: Skin is warm and dry.     Capillary Refill: Capillary refill takes less than 2 seconds.  Neurological:     General: No focal deficit present.     Mental Status: She is alert and oriented to person, place, and time.  Psychiatric:        Mood and Affect: Mood normal.        Behavior: Behavior normal.        Thought Content:  Thought content normal.        Judgment: Judgment normal.     ED Results / Procedures / Treatments   Labs (all labs ordered are listed, but only abnormal results are displayed) Labs Reviewed  COMPREHENSIVE METABOLIC PANEL - Abnormal; Notable for the following components:      Result Value   Chloride 115 (*)    CO2 20 (*)    Glucose, Bld 101 (*)    BUN 35 (*)  Creatinine, Ser 1.88 (*)    GFR, Estimated 36 (*)    All other components within normal limits  CBC WITH DIFFERENTIAL/PLATELET - Abnormal; Notable for the following components:   RBC 2.70 (*)    Hemoglobin 9.9 (*)    HCT 29.3 (*)    MCV 108.5 (*)    MCH 36.7 (*)    Lymphs Abs 0.5 (*)    Eosinophils Absolute 1.2 (*)    All other components within normal limits  C DIFFICILE QUICK SCREEN W PCR REFLEX    GASTROINTESTINAL PANEL BY PCR, STOOL (REPLACES STOOL CULTURE)  LIPASE, BLOOD  URINALYSIS, ROUTINE W REFLEX MICROSCOPIC  I-STAT BETA HCG BLOOD, ED ([facility], [facility], [facility] ONLY)    EKG None  Radiology No results found.  Procedures Procedures   Medications Ordered in ED Medications  lactated ringers bolus 1,000 mL (0 mLs Intravenous Stopped 05/03/22 1010)  morphine (PF) 2 MG/ML injection 2 mg (2 mg Intravenous Given 05/03/22 1006)  dicyclomine (BENTYL) capsule 10 mg (10 mg Oral Given 05/03/22 1309)  loperamide (IMODIUM) capsule 4 mg (4 mg Oral Given 05/03/22 1308)    ED Course/ Medical Decision Making/ A&P Clinical Course as of 05/03/22 1314  Fri May 03, 2022  1103 Still pending rapid c-diff studies.  [LA]    Clinical Course User Index [LA] Mickie Hillier, PA-C    Medical Decision Making Amount and/or Complexity of Data Reviewed Labs: ordered.  Risk Prescription drug management.  Initial Impression and Ddx 37 year old female who presents to the emergency department with diarrhea Patient PMH that increases complexity of ED encounter:  pancreatic and renal transplant on immunosuppression,  hypertension Differential: clostridium, CMV, other infectious diarrhea, etc.   Interpretation of Diagnostics I independent reviewed and interpreted the labs as followed: CMP with creatinine of 1.88.  Appears her baseline is around 1.7.  Mildly elevated but no AKI.  She has mildly decreased bicarb.  She takes chronic bicarb orally.  No transaminitis or elevated bilirubin.  CBC without leukopenia or leukocytosis.  No neutropenia.  Hemoglobin is stable.  Not pregnant.  Lipase is normal.  C. difficile negative  - I independently visualized the following imaging with scope of interpretation limited to determining acute life threatening conditions related to emergency care: Not indicated  Patient Reassessment and Ultimate Disposition/Management Overall well appearing. She is hemodynamically stable without fever. Euvolemic appearing. She has generalized tenderness on palpation without guarding or rebound.  Will obtain labs, GIPP and Cdiff, will give IVF. She is having diarrhea here in ED.   Labs are overall stable with no leukocytosis or leukopenia.  No evidence of neutropenia.  Her hemoglobin is stable.  She is not having bloody diarrhea.  There is mild increase in her creatinine to 1.88 from a baseline around 1.7.  She is getting IV fluids.  There is no transaminitis, elevated bilirubin and her electrolytes are within normal limits.  Her lipase is negative.  Not pregnant.  IV fluids have finished, she received a small dose of morphine for abdominal pain.  She had mild improvement in the symptoms.  She has ongoing abdominal cramping.  I have dosed her with Bentyl and Imodium while she was here in the emergency department.  Her C. difficile returned negative.  The GI pathogen panel is pending and will take some time to result.  Unclear etiology of her ongoing diarrhea.  She had diarrhea about 2 weeks ago and now again another 24-hour episode.  It is not CMV or C. difficile.  The GI pathogen panel will  hopefully eliminate source although it could still be a motility issue versus other.  She is only had 2 episodes of diarrhea here in the emergency department and was able to tolerate p.o.  I think CT abdomen pelvis at this time would be low yield.  She is euvolemic.  I am going to send a referral to [facility] so that she can have appropriate follow-up should she need further investigations/colonoscopy, etc. she is agreeable to this plan.  Given her return precautions for worsening diarrhea, inability to tolerate p.o.  The patient has been appropriately medically screened and/or stabilized in the ED. I have low suspicion for any other emergent medical condition which would require further screening, evaluation or treatment in the ED or require inpatient management. At time of discharge the patient is hemodynamically stable and in no acute distress. I have discussed work-up results and diagnosis with patient and answered all questions. Patient is agreeable with discharge plan. We discussed strict return precautions for returning to the emergency department and they verbalized understanding.     Patient management required discussion with the following services or consulting groups:  None  Complexity of Problems Addressed Acute complicated illness or Injury  Additional Data Reviewed and Analyzed Further history obtained from: Past medical history and medications listed in the EMR, Prior ED visit notes, Recent discharge summary, Recent PCP notes, Care Everywhere, and Prior labs/imaging results  Patient Encounter Risk Assessment Prescriptions, SDOH impact on management, and Consideration of hospitalization  Final Clinical Impression(s) / ED Diagnoses Final diagnoses:  Diarrhea, unspecified type    Rx / DC Orders ED Discharge Orders          Ordered    loperamide (IMODIUM) 2 MG capsule  4 times daily PRN        05/03/22 1312    Ambulatory referral to Gastroenterology        05/03/22 1313               Mickie Hillier, PA-C 05/03/22 1318    Carmin Muskrat, MD 05/03/22 1336

## 2022-05-03 NOTE — ED Notes (Signed)
3rd attempt to collect urine sample patient still unable to urinate

## 2022-05-03 NOTE — ED Triage Notes (Signed)
Pt reports diarrhea x 2 weeks. Hx cdiff

## 2022-05-04 NOTE — Unmapped (Signed)
Pt paged on call TNC, she reports that she has had abdominal cramping and diarrhea for 2 weeks. PT has been to the ER 3 times for same. PT has been to different ER's for same. She feels like she is not getting any answers and she is having pain.Marland Kitchen PT was told that this TNC would send her primary nephrologist a message and try to get her in with GI and pt did not want to do that, she wants an answer as to why she does not feel well. PT was notified that TNC could not give her that answer. She states that she thinks she is going to go back to ER, she was told that if she does she should go to [facility] as her doctors are there. Pt was unhappy during phone call, states that she is not calling just for pain meds and eventually hung up.

## 2022-05-06 MED FILL — ENVARSUS XR 1 MG TABLET,EXTENDED RELEASE: ORAL | 30 days supply | Qty: 60 | Fill #11

## 2022-05-06 MED FILL — ENVARSUS XR 4 MG TABLET,EXTENDED RELEASE: ORAL | 30 days supply | Qty: 120 | Fill #11

## 2022-05-06 MED FILL — AZATHIOPRINE 50 MG TABLET: ORAL | 30 days supply | Qty: 60 | Fill #4

## 2022-05-06 MED FILL — ASPIRIN 81 MG TABLET,DELAYED RELEASE: ORAL | 30 days supply | Qty: 30 | Fill #11

## 2022-05-06 NOTE — Unmapped (Signed)
Called to check in with patient after she called on call TNC over the weekend.  Pt reports her diarrhea has improved and she is not needing to take imodium.  Pt advised it was ok to take the imodium up to 4 times daily as needed for diarrhea since her C.diff was negative.  Pt offered an appt with Dr. Carlene Coria, but she reports she is going to work on seeing a GI doctor locally as [facility] has referred her.  Pt will reach if she needs anything in the mean time.

## 2022-05-06 NOTE — Unmapped (Signed)
Patient submitted e-visit for vomiting.  When asked further questions to gather more information, patient did not respond in over 48 hours.  E-visit cancelled, no charge.

## 2022-05-07 NOTE — Unmapped (Signed)
UNOS form

## 2022-05-08 DIAGNOSIS — Z940 Kidney transplant status: Principal | ICD-10-CM

## 2022-05-13 ENCOUNTER — Telehealth: Payer: Self-pay | Admitting: Gastroenterology

## 2022-05-13 DIAGNOSIS — D708 Other neutropenia: Principal | ICD-10-CM

## 2022-05-13 NOTE — Telephone Encounter (Signed)
Good Afternoon Dr. Candis Schatz  Supervising Provider 4/1 PM  We have received a referral for this patient from [facility] Emergency Department. Patient has a history of C-Diff and states she is starting to show symptoms and has been in a lot of pain. She has history with [facility], wishing to transfer her care to [place] given that we are closer to her and she was never able to establish personal care with her previous provider. Records are available to view in Epic, please review them at your earliest convenience and advise on scheduling.   Thank You!

## 2022-05-14 ENCOUNTER — Ambulatory Visit
Admit: 2022-05-14 | Discharge: 2022-05-16 | Disposition: A | Discharge status: Home / Self Care | Payer: MEDICARE | Admitting: Nephrology

## 2022-05-14 LAB — COMPREHENSIVE METABOLIC PANEL
ALBUMIN: 3.6 g/dL (ref 3.4–5.0)
ALKALINE PHOSPHATASE: 44 U/L — ABNORMAL LOW (ref 46–116)
ALT (SGPT): 14 U/L (ref 10–49)
ANION GAP: 6 mmol/L (ref 5–14)
AST (SGOT): 19 U/L (ref <=34)
BILIRUBIN TOTAL: 0.8 mg/dL (ref 0.3–1.2)
BLOOD UREA NITROGEN: 23 mg/dL (ref 9–23)
BUN / CREAT RATIO: 13
CALCIUM: 9.2 mg/dL (ref 8.7–10.4)
CHLORIDE: 111 mmol/L — ABNORMAL HIGH (ref 98–107)
CO2: 24 mmol/L (ref 20.0–31.0)
CREATININE: 1.84 mg/dL — ABNORMAL HIGH
EGFR CKD-EPI (2021) FEMALE: 37 mL/min/{1.73_m2} — ABNORMAL LOW (ref >=60)
GLUCOSE RANDOM: 89 mg/dL (ref 70–179)
POTASSIUM: 4.8 mmol/L (ref 3.4–4.8)
PROTEIN TOTAL: 7.1 g/dL (ref 5.7–8.2)
SODIUM: 141 mmol/L (ref 135–145)

## 2022-05-14 LAB — URINALYSIS WITH MICROSCOPY WITH CULTURE REFLEX
BILIRUBIN UA: NEGATIVE
GLUCOSE UA: NEGATIVE
KETONES UA: NEGATIVE
LEUKOCYTE ESTERASE UA: NEGATIVE
NITRITE UA: NEGATIVE
PH UA: 6 (ref 5.0–9.0)
PROTEIN UA: NEGATIVE
RBC UA: 3 /HPF (ref <=4)
SPECIFIC GRAVITY UA: 1.015 (ref 1.003–1.030)
SQUAMOUS EPITHELIAL: 1 /HPF (ref 0–5)
UROBILINOGEN UA: <2
WBC UA: 1 /HPF (ref 0–5)

## 2022-05-14 LAB — SLIDE REVIEW

## 2022-05-14 LAB — CBC W/ AUTO DIFF
BASOPHILS ABSOLUTE COUNT: 0 10*9/L (ref 0.0–0.1)
BASOPHILS RELATIVE PERCENT: 0.6 %
EOSINOPHILS ABSOLUTE COUNT: 2.1 10*9/L — ABNORMAL HIGH (ref 0.0–0.5)
EOSINOPHILS RELATIVE PERCENT: 55.8 %
HEMATOCRIT: 27.9 % — ABNORMAL LOW (ref 34.0–44.0)
HEMOGLOBIN: 9.6 g/dL — ABNORMAL LOW (ref 11.3–14.9)
LYMPHOCYTES ABSOLUTE COUNT: 0.5 10*9/L — ABNORMAL LOW (ref 1.1–3.6)
LYMPHOCYTES RELATIVE PERCENT: 11.9 %
MEAN CORPUSCULAR HEMOGLOBIN CONC: 34.3 g/dL (ref 32.0–36.0)
MEAN CORPUSCULAR HEMOGLOBIN: 36.7 pg — ABNORMAL HIGH (ref 25.9–32.4)
MEAN CORPUSCULAR VOLUME: 106.9 fL — ABNORMAL HIGH (ref 77.6–95.7)
MEAN PLATELET VOLUME: 9 fL (ref 6.8–10.7)
MONOCYTES ABSOLUTE COUNT: 0.2 10*9/L — ABNORMAL LOW (ref 0.3–0.8)
MONOCYTES RELATIVE PERCENT: 5.1 %
NEUTROPHILS ABSOLUTE COUNT: 1 10*9/L — ABNORMAL LOW (ref 1.8–7.8)
NEUTROPHILS RELATIVE PERCENT: 26.6 %
PLATELET COUNT: 168 10*9/L (ref 150–450)
RED BLOOD CELL COUNT: 2.61 10*12/L — ABNORMAL LOW (ref 3.95–5.13)
RED CELL DISTRIBUTION WIDTH: 14.7 % (ref 12.2–15.2)
WBC ADJUSTED: 3.8 10*9/L (ref 3.6–11.2)

## 2022-05-14 LAB — PREGNANCY, URINE: PREGNANCY TEST URINE: NEGATIVE

## 2022-05-14 LAB — LIPASE: LIPASE: 26 U/L (ref 12–53)

## 2022-05-14 LAB — AMYLASE: AMYLASE: 144 U/L — ABNORMAL HIGH (ref 30–118)

## 2022-05-14 LAB — MAGNESIUM: MAGNESIUM: 1.5 mg/dL — ABNORMAL LOW (ref 1.6–2.6)

## 2022-05-14 MED ADMIN — ondansetron (ZOFRAN-ODT) disintegrating tablet 4 mg: 4 mg | ORAL | @ 22:00:00

## 2022-05-14 MED ADMIN — ciprofloxacin (CIPRO) 2 mg/mL in dextrose 5% IVPB 400 mg: 400 mg | INTRAVENOUS | @ 20:00:00 | Stop: 2022-05-14

## 2022-05-14 MED ADMIN — sodium chloride 0.9% (NS) bolus 500 mL: 500 mL | INTRAVENOUS | @ 22:00:00 | Stop: 2022-05-14

## 2022-05-14 NOTE — Unmapped (Addendum)
Elaine Koch is a 37 y.o. 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 Below is a summary of her hospital course by problem.    Subacute Diarrhea, Abdominal Pain, Nausea, C/f Infectious Process  Patient presented with 1.5 months of intermittent watery diarrhea, nausea w/ emesis x2, cramping pelvic pain. On exam, patient also had epigastric pain and tenderness over L renal graft and R pancreas graft. Prior to her current admission, patient had multiple ER visits with work-up showing negative CMV PCR and reassuring non-contrast CTAP on 3/14; positive enteropathogenic E. coli on 3/22. Patient felt like her symptoms were similar to her previous C. diff infections, though C. diff testing was negative on admission. Given duration of symptoms in immunosuppressed patient, patient was treated with IV Cipro for EPEC starting on 4/2 while GIPP was pending. On 4/4, GIPP results were negative and Cipro was discontinued. Patient did not have diarrhea during her admission. Given improvement in abdominal pain and diarrhea symptoms, patient was discharged with plan for hospital follow-up with Nephrology and GI referral for further work-up if symptoms recur, including stool osmotic gap and cosyntropin testing, as well as possible lower endoscopy to assess for eosinophilic colitis 2/2 azathioprine. Patient was instructed to keep a food diary to bring to her appointments.     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 Carlene Coria in Transplant Nephrology. She is s/p kidney and pancreas transplant in 2022 for T1DM. On admission, serum Cr was at/near her baseline. Prior baseline 1.3-1.6, but since mid-2023, it has been closer to 1.8. Patient's lipase was normal, and her amylase was at her baseline. Patient received IV fluids and was continued on her home Envarsus 18mg  daily, Azathioprine 100mg  daily, and Bicarb 1300mg  TID.     Active, Symptomatic BV with Frequent Recurrence  Patient has a history of recurrent BV infections including prior to and after her transplants. Patient endorsed symptoms of vaginal discharge, itching, odor with positive vaginitis screen on admission. Tric, chlamydia, gonorrhea negative. Patient was started on IV Flagyl 500mg  BID and discharged on oral Flagyl for a total of 7 days of therapy.     Macrocytic Anemia   Patient's labs on admission significant for macrocytic anemia with MCV of 107. Her baseline MCV seems to be elevated around 109. This is suspected to be due to her home azathioprine. B12 and folate levels were normal. Iron studies were consistent with concurrent anemia of chronic disease.     Peripheral Eosinophilia  Patient's labs on admission were significant for a new eosinophil % of 55.8 and absolute eosinophil count of 2.1. Chronic diarrhea is a possible presenting symptom of adrenal insufficiency, however, patient did not have electrolyte or blood pressure abnormalities. Patient endorses bad seasonal allergies, which could also explain her current peripheral eosinophilia. Parasitic infections were considered but less likely given lack of exposures and travel history. Also on the differential is eosinophilic colitis due to azathioprine, with plan for outpatient work-up with lower endoscopy if GI symptoms recur.

## 2022-05-14 NOTE — Unmapped (Signed)
Bed: 68-D  Expected date:   Expected time:   Means of arrival:   Comments:  32

## 2022-05-14 NOTE — Unmapped (Signed)
Transplant Nephrology Consult     Requesting provider: Dr. Selena Batten  Service requesting consult: ED  Reason for consult: kidney transplant status      Assessment/Recommendations: Elaine Koch is a 37 y.o. 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       # Kidney allograft function: (stable)  - Serum creatinine level is at/near baseline. Prior baseline 1.3-1.6 but since mid 2023 it is closer to 1.8.    # Pancreas allograft function: stable  Remains independent of insulin with normal blood sugars. Lipase normal, amylase added/pending (her amylase is typically slightly high, 140-150s).    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 Her Cdiff testing was negative 05/03/22 at [facility]. At that ER visit, she did have a postiive enteropathogenic E coli result.   - please repeat CMV pcr (was negative 04/25/22, low probability of the answer)  - consider treatment for enteropathogenic E coli due to prolonged duration of symptoms in immunosuppressed patient. Use fluoroquinolone; monitor for elevation of tacrolimus levels due to drug-drug interaction.  - if not improving would potentially need more GI workup  - uses Bentyl as needed for abdominal cramping on outpatient basis (per 2022 GI note)  - had reassuring noncont CTAP 04/25/22  - please do urine and blood cultures to rule out atypical presentation of a serious bacterial infection    **Transplant patients with an open wound require wound care with sterile water only. The patient should be counseled on this at the time of discharge if they have not already been doing this.    Leafy Half, MD  Division of Nephrology and Hypertension  [facility]  05/14/2022  2:25 PM      _____________________________________________________________________________________          History of Present Illness: Elaine Koch is a/an 37 y.o. 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     Reports about one month of intermittent symptoms which have been disruptive. Three ER visits and has been awaiting outpatient local GI but no confirmed appt. Symptoms seemed to start after taking amoxicillin (for pneumonia) and Flagyl (for BV) within 2 weeks of each other; patient thought it was recurrent C diff based on her history.     Abd pain used to be bilateral lower abd crampy pain, more recently mid/upper abd bilaterally; nontender.      Medications:   No current facility-administered medications for this encounter.     Current Outpatient Medications   Medication Sig Dispense Refill    aspirin (ECOTRIN) 81 MG tablet Take 1 tablet (81 mg total) by mouth daily. 30 tablet 11  azathioprine (IMURAN) 50 mg tablet Take 2 tablets (100 mg total) by mouth daily 60 tablet 11    loperamide (IMODIUM) 2 mg capsule Take 1 capsule (2 mg total) by mouth two (2) times a day as needed for diarrhea.      multivitamin, prenatal, folic acid-iron, 29 mg iron- 1 mg Take 1 tablet by mouth daily. 90 tablet 5    sodium bicarbonate 650 mg tablet Take 2 tablets (1,300 mg total) by mouth Three (3) times a day. 180 tablet 3    tacrolimus (ENVARSUS XR) 1 mg Tb24 extended release tablet Take 2 tablets (2 mg total) by mouth daily. Take  2 tablets ( 2 mg total) in addition to 4 tablets ( 16 mg total)  for a total daily dose of 18 mg daily 60 tablet 11    tacrolimus (ENVARSUS XR) 1 mg Tb24 extended release tablet Take 2 tablets (2 mg total) by mouth in the morning. Take FOUR 4 mg tablets (16 mg total) and TWO 1 mg tablets (2 mg total) for a total dose of 18 mg daily. 60 tablet 11    tacrolimus (ENVARSUS XR) 4 mg Tb24 extended release tablet Take 4 tablets (16 mg total) by mouth daily. Take FOUR 4 mg tablets (16 mg total) and TWO 1 mg tablets (2 mg total) for a total dose of 18 mg daily 120 tablet 11        ALLERGIES  Iodinated contrast media, Nickel, Propranolol, Eye irrigating solution [ophthalmic irrigation solution], Iodine, Naltrexone, and Uni-cortrom    MEDICAL HISTORY  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        SOCIAL HISTORY  Social History     Socioeconomic History    Marital status: Single    Number of children: 1    Highest education level: Some college, no degree   Occupational History    Occupation: patient registration   Tobacco Use    Smoking status: Never    Smokeless tobacco: Never   Vaping Use    Vaping status: Never Used   Substance and Sexual Activity    Alcohol use: Never    Drug use: Never    Sexual activity: Not Currently     Partners: Male     Social Determinants of Health     Financial Resource Strain: Medium Risk (03/30/2018)    Overall Financial Resource Strain (CARDIA)     Difficulty of Paying Living Expenses: Somewhat hard   Physical Activity: Insufficiently Active (04/20/2019)    Exercise Vital Sign     Days of Exercise per Week: 2 days     Minutes of Exercise per Session: 30 min   Stress: No Stress Concern Present (04/20/2019)    [employer] of Occupational Health - Occupational Stress Questionnaire     Feeling of Stress : Only a little   Social Connections: Unknown (04/20/2019)    Social Connection and Isolation Panel [NHANES]     Frequency of Communication with Friends and Family: Patient declined     Frequency of Social Gatherings with Friends and Family: Patient declined     Attends Religious Services: Patient declined  Active Member of Clubs or Organizations: Patient declined     Attends Banker Meetings: Patient declined     Marital Status: Patient declined        FAMILY HISTORY  Family History   Problem Relation Age of Onset    Diabetes Mother     Diabetes Father     Cancer Maternal Grandmother     Diabetes Maternal Grandfather     Diabetes Paternal Grandmother           Review of Systems:  A 12 system review of systems was negative except as noted in HPI.  Otherwise as per HPI, all other systems reviewed and negative    Physical Exam:  Vitals:    05/14/22 1407   BP: 115/75   Pulse: 89   Resp: 16   Temp: 36.7 ??C (98 ??F)   SpO2: 100%     No intake/output data recorded.  No intake or output data in the 24 hours ending 05/14/22 1425    General: well-appearing, no acute distress  HEENT: anicteric sclera, oropharynx clear without lesions  CV: regular rate, normal rhythm, no murmurs, no gallops, no rubs, no peripheral edema  Lungs: clear to auscultation bilaterally, normal work of breathing  Abdomen: soft, non-tender, non-distended, graft site nontender  Skin: no visible lesions or rashes  Psych: alert, engaged, appropriate mood and affect  Musculoskeletal: no obvious deformities  Neuro: normal speech, no gross focal deficits     Test Results  Reviewed  Lab Results   Component Value Date    NA 141 05/14/2022    K 4.8 05/14/2022    CL 111 (H) 05/14/2022 CO2 24.0 05/14/2022    BUN 23 05/14/2022    CREATININE 1.84 (H) 05/14/2022    GLU 89 05/14/2022    CALCIUM 9.2 05/14/2022    ALBUMIN 3.6 05/14/2022    PHOS 2.9 10/11/2021           I have reviewed all relevant outside healthcare records related to the patient's kidney injury.

## 2022-05-14 NOTE — Unmapped (Signed)
Responded to MyChart message from patient reporting extreme stomach cramps and diarrhea that comes and go every few days. Has been to ED 3x in last few weeks, GI panel negative, advised to use Imodium PRN. She was offered an appt with Dr. Carlene Coria which she declined as she wants to follow up with GI at [facility] instead. She is asking to be admitted at this point because she can't take it anymore.     Informed Dr. Gwynneth Munson (covering MD), he recommended she come to [facility] ED for further evaluation. Dr. Elvera Maria made aware patient is on the way.    Spoke with patient, she agreed to come to [facility] ED. States Imodium is no longer helping and she needs to see a GI doctor for this ongoing issue. Recommended that she ask for GI and Nephrology consult in ED. No other needs at this time.

## 2022-05-14 NOTE — Unmapped (Signed)
Patient here with diarrhea and upper abdominal pain that radiates to right flank. Hx kidney-pancreas transplant. Sent in by her transplant team to see GI and nephro. Patient reports being seen here several times for same complaint

## 2022-05-14 NOTE — Unmapped (Signed)
[facility]  Telephone Note         Recommendations:   Elaine Koch is a 37 y.o. female that presented to [facility] with abdominal pain and diarrhea. I was called by the ED for recommendations regarding this patients diarrhea.    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 She previously followed with Dr Fara Boros and was most recently seen in clinic in 08/2020 at which time she did not think patient had c difficile colitis and thought that abdominal pain and diarrhea may be multifactorial with potential contribution from immunosuppressive medications and/or IBS. Most recently outside labs 3/22 showed negative c diff stool and positive enterotoxigenic E coli (ETEC). Repeat C diff at [facility] 4/2 negative. I was paged by the ED about this patient who is being admitted to Med B service with abdominal pain and diarrhea. I explained that we typically do not see consults before they have been admitted to the hospital unless they are boarding and I also spoke directly to the Med B resident who is starting to admit the patient. She confirmed her team is admitted the patient and starting the work-up. Their team will not hesitate to reach out to our [facility] consult team if they think that a consult is warranted.     I have neither seen nor examined this patient, I have only discussed the patient with the requesting provider and briefly reviewed the patient's chart. I have provided the requesting provider with the above recommendations based on this limited revaluation. Ultimately, the final decision making regarding the patient's care lies with the requesting provider.

## 2022-05-14 NOTE — Unmapped (Signed)
Nephrology ([facility]) History & Physical    Assessment & Plan:   Elaine Koch is a 37 y.o. 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 diff s/p fecal transplant (May 2022), recurrent BV who presented to [facility] with 1 month of intermittent non-bloody diarrhea, crampy abdominal pain, and nausea.     Principal Problem:    Diarrhea of presumed infectious origin  Active Problems:    Abdominal pain    History of simultaneous kidney and pancreas transplant (CMS-HCC)    BV (bacterial vaginosis)  Resolved Problems:    * No resolved hospital problems. *    Active Problems  Subacute diarrhea, abdominal pain, nausea c/f infectious process  Presenting with 1.5 month intermittent watery diarrhea, nausea w/ emesis x 2, cramping pelvic pain which is now epigastric and particularly tender over L renal graft and R pancreas graft. Patient has a history of recurrent C. diff infections (s/p fecal transplant May 2022) and feels this is similar, however her C dif is negative on admission and on 3/22. Multiple ER visits with work-up showing negative CMV PCR and reassuring non-contrast CTAP on 04/25/22; positive enteropathogenic E. coli on 05/03/22. She is non-toxic appearing and comfortable with abdominal pain on palpation as described above. Will treat for enteropathogenic E. coli due to prolonged duration of symptoms in immunosuppressed patient. CMV is also on the differential given immunosuppression and transplant status, but less likely. If symptoms not improving with antibiotic treatment, can consider further GI work-up for malabsorption. She did have previous colonoscopy in 2022 for fecal transplant which appeared normal on brief inspection but no biopsies taken.  - Daily BMP, CBC  - Follow up repeat CMV PCR, UCx, Bcx (UA unremarkable), GIPP  - IV Ciprofloxacin 400mg  q12H (4/2-4/8) for total of 7 days  - IV bolus of 500 NS   - Zofran prn; will get updated qtc while on cipro  - Avoid imodium  - If no improvement, will consult ICID  - Appreciate input from transplant nephro     History of Kidney-Pancreas Transplant (05/03/19) - Immunosuppression   Follows with Dr. Carlene Coria in transplant nephro. S/p kidney & pancreas transplant in 2022 for Type 1 diabetes. Serum creatinine level is at/near baseline. Prior baseline 1.3-1.6, but since mid-2023, it is closer to 1.8. Normal lipase, amylase on baseline. Reportedly making good urine and has not missed any immunosuppression.   - Daily BMP, strict I&O  - Tac goal level 8-10 (higher goal due to pancreas transplant), daily tac levels   - Continue home Envarsus 18 mg daily  - Continue Azathioprine 100mg  daily (not on mycophenolate due to childbearing concerns)  - Bicarb 1300mg  TID   - Follow up amylase    Active, symptomatic BV with frequent recurrence   Symptoms of vaginal discharge, itching, odor with positive vaginitis screen on admission. Pt reports frequent BV flares which resolve with PO flagyl (more effective than cream). Trich neg.  - IV Flagyl 500mg  BID (4/2-4/8) for total of 7 days, suspect she will tolerate better than PO but can switch on discharge   - Follow up chlamydia/gonorrheae     Macrocytic Anemia  Patient's labs on admission significant for macrocytic anemia with MCV of 107. Her baseline MCV seems to be elevated around 109. This is likely a result of her home azathioprine. However, given her acute on chronic GI symptoms, will evaluate her for vitamin deficiencies, malabsorption.  - Follow up B12, folate, and iron studies  Hx Type 1 DM - Now cured, s/p pancreas transplant in 2022. BMP nl.     The patient's presentation is complicated by the following clinically significant conditions requiring additional evaluation and treatment: - Chronic kidney disease POA requiring further investigation, treatment, or monitoring      Issues Impacting Complexity of Management:  -The patient is at high risk of complications from IV antibiotics    Checklist:  Diet: Regular Diet  DVT PPx: Not Indicated - Padua Score <4  Code Status: Full Code  Dispo: Patient appropriate for Inpatient based on expectation of ongoing need for hospitalization greater than two midnights based on severity of presentation/services including IV antibiotics    Team Contact Information:   Primary Team: Nephrology ([facility]B)  Primary Resident: Devota Pace, MD  Resident's Pager: [phone] (Nephrology Intern - Tower)    Chief Concern:   Diarrhea of presumed infectious origin    Subjective:   Elaine Koch is a 37 y.o. 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 diff s/p fecal transplant (May 2022), recurrent BV who presented to [facility] with 1 month of intermittent non-bloody diarrhea, crampy abdominal pain, and nausea.     History obtained by patient.     HPI:  Patient reports that her symptoms started ~1.5 months ago with cramping pain in her bilateral lower abdomen, including over her kidney graft site (left). The pain felt like menstrual cramps. She soon started to have watery diarrhea and intense nausea with 2 episodes of non-bloody emesis in total. Her symptoms have been intermittent, with some days of frequent diarrhea and others with a lot of small, formed stools without straining. Her symptoms are worse after eating but not always associated with meals. Patient reports that her symptoms seem to be worse at night. She can have up to 6 BMs between 9:30pm and 5am. The diarrhea seems to improve around lunch time.     Denies blood in the stool. Stools are not particularly greasy. She reports 1 episode of bowel incontinence. On days when she does not have diarrhea, she also reports decreased urination. Appetite is okay. Denies mouth ulcers or pain.     Patient denies any sick contacts, new foods, travel. Patient reports that her symptoms seem to have started after she was started on amoxicillin for her for pneumonia in 04/06/22 at [facility]. She states that her current symptoms feel like when she had C. diff in the past. She presented to [facility] ED on 3/22 for these symptoms. Work-up was positive for enteropathogenic E. coli, negative for C. Diff (CMV PCR negative at 04/25/22 [facility] ED visit).     Currently, patient's cramping pain has moved to the epigastric region and bilateral back region. She also has tenderness over both her kidney transplant site (left) and her pancreas transplant site (right). A week ago, she started to have bilateral flank pain. Denies dysuria, coca-cola colored urine, frothy urine. Endorses seeing a small amount of blood in her urine that she thinks might have been due to her period.     Patient also reports a history of recurrent BV with associated vaginal discharge and odor. She was last treated in February 2024. She prefers oral instead of topical Flagyl. Denies chance of pregnancy. LMP in February, but patient notes her menstrual cycle is irregular.     In the ED:  Vitals: afebrile, HR 89, BP 115/75, RA  Labs: Notable for Cr 1.8/BUN 23, Mg 1.5, Hb 9.6  stable from prior (macrocytic), LFTs normal, neg Upreg  Cultures: RPP neg, bacterial vaginitis pos, C diff pending, UA noninfectious, STI testing pending, GIPP pending, urine and blood cx pending  EKG: not done  Imaging: not done   ED Interventions: none     Pertinent Surgical Hx  2022 pancreas and kidney transplant as above    Pertinent Family Hx  Patient states that GI conditions run in her family, including IBS, possible UC, and multiple gallbladder removals.     Family History   Problem Relation Age of Onset    Diabetes Mother     Diabetes Father     Cancer Maternal Grandmother     Diabetes Maternal Grandfather     Diabetes Paternal Grandmother      Pertinent Social Hx   Patient lives in [place] with 6yo son. Works at [employer] in [place],   Denies tobacco, recreational drugs. Drinks ETOH occasionally in social settings. Allergies  Iodinated contrast media, Nickel, Propranolol, Eye irrigating solution [ophthalmic irrigation solution], Iodine, Naltrexone, and Uni-cortrom    The med list is as described in the plan.    Designated Chiropodist Maker:  Ms. Gennette currently has decisional capacity for healthcare decision-making and is able to designate a surrogate healthcare decision maker. Ms. Kaplowitz designated healthcare decision maker(s) is/are Levonne Lapping (the patient's other adult) as denoted by stated patient preference.    Objective:   Physical Exam:  Temp:  [36.7 ??C (98 ??F)-36.8 ??C (98.3 ??F)] 36.8 ??C (98.3 ??F)  Heart Rate:  [73-96] 73  Resp:  [16] 16  BP: (115-157)/(65-94) 148/84  SpO2:  [98 %-100 %] 98 %    Gen: NAD, well appearing  HEENT: Atraumatic, normocephalic  CV: RRR  Pulm: CTA bilaterally, symetric chest rise  Abdomen: Soft, mild tenderness over kidney graft site on left side, mild tenderness over pancreas transplant site on right side, no CVA tenderness  Ext: No edema   Psych: Normal mood and affect    Wendi Maya, MS4    I attest that I have reviewed the student note and that the components of the history of the present illness, the physical exam, and the assessment and plan documented were performed by me or were performed in my presence by the student where I verified the documentation and performed (or re-performed) the exam and medical decision making.    Devota Pace, MD PGY-1  [facility] Internal Medicine

## 2022-05-14 NOTE — Unmapped (Signed)
[facility] Emergency Department Provider Note    Patient Name: Elaine Koch [facility]  Chief Complaint: Abdominal Pain     ED Clinical Impression     Final diagnoses:   Abdominal pain, unspecified abdominal location (Primary)   Other Escherichia coli (E. coli) as the cause of diseases classified elsewhere   Bacterial vaginitis        Impression, ED Course, Assessment and Plan   MDM  This is a 37 year old female with medical history of ESRD, T1DM, SP pancreas/kidney transplant, recurrent C. difficile SP fecal transplant, presenting to the emergency department secondary to a 1 month history of intermittent nonbloody diarrhea associated with generalized abdominal pain, nausea.  Patient with multiple ED visits with negative workup including negative CMV PCR, C. difficile, and CT imaging.  However, recent positive enteropathogenic E. coli in stool studies.  Upon arrival, patient appears in no significant distress, resting comfortably bedside.  Moist mucous membranes.  Abdominal examination benign without evidence of rebound, guarding, rigidity.  Patient was sent in by her transplant team for further evaluation by renal transplant services.  Laboratory studies show no evidence of leukocytosis, WBC 3.8.  Hemoglobin at 9.6, however compared to prior studies, patient typically is hemoglobin levels ranging from 8.0-10.2.  No acute metabolic derangements noted, creatinine at 1.84 which is slightly increased from baseline.  Lipase within normal limit at 26.  Urine pregnancy test negative.  Due to recent history of antibiotic use, persistent symptoms, we will repeat stool studies at this time.  Patient requesting self swab, politely declines genitourinary examination.  BV positive, trichomoniasis negative, yeast negative.  Urinalysis shows no acute abnormalities.  Patient provided with ciprofloxacin given prolonged persistent symptoms, positive enteropathogenic E. coli, immunosuppression.  Patient also given dose of metronidazole.  Transplant team consulted, who recommend admission to their services.  Patient admitted in stable condition.      Discussion of Management with other Physicians, QHP or Appropriate Source: Transplant team, GI services  Independent Interpretation of Studies: N/A  External Records Reviewed: Patient's most recent outside Emergency Department visit 05/03/2022 [facility] ED note for patient's past medical history.  Escalation of Care, Consideration of Admission/Observation/Transfer: N/a  Social determinants that significantly affected care: None applicable  Prescription drug(s) considered but not prescribed: N/A  Diagnostic tests considered but not performed: CT abd/pelvis  History obtained from other sources: None     I have reviewed the vital signs and the nursing notes. Labs and radiology results that were available during my care of the patient were independently reviewed by me and considered in my medical decision making.     I reviewed the patient's prior medical records.    Disposition:   The patient was admitted in stable condition.       History   History obtained from: Patient     Elaine Koch is a 37 y.o. female with a past medical history of T1DM, HTN, and ESRD (s/p renal and pancreatic transplant in 2022, on tacrolimus) who presents to the ED for evaluation of several weeks of intermittent bilateral upper quadrant abdominal cramping with associated nausea, watery, non-bloody diarrhea, and rare episodes of NBNB emesis. No known triggers for pain. She has taken Imodium without symptomatic improvement. Of note, she was diagnosed with pneumonia about 1 month ago (04/06/2022) and was prescribed a course of amoxicillin. She endorses a history of C. Diff, but recent assays have been negative (last on 03/22). Abdominal surgical history includes renal and pancreatic transplant, as well as Cesarean section.  She additionally reports some abnormal vaginal discharge and notes a history of recurrent BV infections. Denies chance of pregnancy. LMP in February, but patient notes her [facility] is irregular. She denies fevers, syncope, chest pain, shortness of breath, dysuria, vaginal bleeding, cough, congestion, or sore throat.     Per chart review, patient was seen in this and at [facility] ED over the past month (04/24/2022 and 05/03/2022, respectively) for evaluation of her symptoms. CT A/P and US renal on 03/13 were overall reassuring, showing no abnormalities. Most recently at [facility] ED on 03/22, her labs were overall reassuring. She was treated with IV fluids, morphine, Bentyl, and Imodium and was discharged in stable condition with referral to GI.     Past Medical/Past Surgical History:   Reviewed in EHR including nursing documentation as outlined. Pertinent PMH/PSH also noted above in HPI.   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     Patient Active Problem List   Diagnosis    Anemia    Diabetic nephropathy (CMS-HCC)    Diabetic retinopathy (CMS-HCC)    Type 1 diabetes mellitus (CMS-HCC)    Sickle cell trait (CMS-HCC)    Other secondary hypertension    History of blood transfusion    Cardiomyopathy (CMS-HCC)    Kidney replaced by transplant    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    AKI (acute kidney injury) (CMS-HCC)    COVID-19 in immunocompromised patient (CMS-HCC)    Previous cesarean delivery affecting pregnancy    Supervision of other high risk pregnancies, first trimester    Other neutropenia (CMS-HCC)    Diarrhea of presumed infectious origin    Abdominal pain    History of simultaneous kidney and pancreas transplant (CMS-HCC)    BV (bacterial vaginosis)     Past Surgical History:   Procedure Laterality Date    AV FISTULA PLACEMENT  2018    CESAREAN SECTION      PR COLONOSCOPY FLX DX W/COLLJ SPEC WHEN PFRMD N/A 02/14/2020    Procedure: COLONOSCOPY, FLEXIBLE, PROXIMAL TO SPLENIC FLEXURE; DIAGNOSTIC, W/WO COLLECTION SPECIMEN BY BRUSH OR WASH;  Surgeon: Carmon Ginsberg, MD;  Location: GI PROCEDURES MEM[facility];  Service: Gastroenterology    PR COLONOSCOPY FLX DX W/COLLJ SPEC WHEN PFRMD N/A 06/19/2020    Procedure: COLONOSCOPY, FLEXIBLE, PROXIMAL TO SPLENIC FLEXURE; DIAGNOSTIC, W/WO COLLECTION SPECIMEN BY BRUSH OR WASH;  Surgeon: Carmon Ginsberg, MD;  Location: GI PROCEDURES MEMORIAL [facility];  Service: Gastroenterology    PR FECAL MICROBIOTA PREP INSTIL N/A 02/14/2020    Procedure: PREP W INSTILLATION FECAL MICROBIOTA, ANY METHOD;  Surgeon: Carmon Ginsberg, MD;  Location: GI PROCEDURES [facility];  Service: Gastroenterology    PR INDUCED ABORTN BY DIL/EVAC N/A 03/20/2021    Procedure: DILATION AND EVACUATION - INDUCED;  Surgeon: Gaynelle Cage, MD;  Location[facility];  Service: Family Planning    PR LAP,DIAGNOSTIC ABDOMEN N/A 11/17/2020    Procedure: DIAGNOSTIC AND OR OPERATIVE LAPAROSCOPY;  Surgeon: Estil Daft, MD;  Location: MAIN OR [facility];  Service: Womens Primary Gynecology    PR PREPARE FECAL MICROBIOTA FOR INSTILLATION  06/19/2020    Procedure: PREP FECAL MICROBIOTA  FOR INSTILLATION, INCLUDING ASSESSMENT OF DONOR SPECIMEN;  Surgeon: Carmon Ginsberg, MD;  Location: GI PROC[facility]URES [facility];  Service: Gastroenterology    PR SURG RX MISSED ABORTN,1ST TRI N/A 11/17/2020    Procedure: VACUUM ASPIRATION;  Surgeon: Estil Daft, MD;  Location: MAIN OR [facility];  Service: [facility] Primary Gynecology    PR TRANSPLANT ALLOGRAFT PANCREAS N/A 05/03/2019    Procedure: TRANSPLANTATION OF PANCREATIC ALLOGRAFT;  Surgeon: Leona Carry, MD;  Location: MAIN OR [facility];  Service: Transplant    PR TRANSPLANT,PREP CADAVER RENAL GRAFT N/A 05/03/2019 Procedure: [facility] STD PREP CAD DONR RENAL ALLOGFT PRIOR TO TRNSPLNT, INCL DISSEC/REM PERINEPH FAT, DIAPH/RTPER ATTAC;  Surgeon: Leona Carry, MD;  Location: MAIN OR [facility];  Service: Transplant    PR TRANSPLANT,PREP DONOR PANCREAS N/A 05/03/2019    Procedure: BACKBENCH STANDARD PREPARATION OF CADAVER DONOR PANCREAS ALLOGRAFT PRIOR TO TRANSPLANTATION;  Surgeon: Leona Carry, MD;  Location: MAIN OR [facility];  Service: Transplant    PR TRANSPLANTATION OF KIDNEY N/A 05/03/2019    Procedure: RENAL ALLOTRANSPLANTATION, IMPLANTATION OF GRAFT; WITHOUT RECIPIENT NEPHRECTOMY;  Surgeon: Leona Carry, MD;  Location: MAIN OR [facility];  Service: Transplant    TONSILLECTOMY         Social History/Family History:   Reviewed in EMR, I agree with nursing documentation. Additional pertinent social and family history noted in HPI.   Social History     Tobacco Use    Smoking status: Never    Smokeless tobacco: Never   Vaping Use    Vaping status: Never Used   Substance Use Topics    Alcohol use: Never    Drug use: Never     Family History   Problem Relation Age of Onset    Diabetes Mother     Diabetes Father     Cancer Maternal Grandmother     Diabetes Maternal Grandfather     Diabetes Paternal Grandmother        ROS  A review of systems reviewed and are negative except as stated in the HPI or noted below.   Constitutional; Neg for fevers   Eyes: Neg for vision changes. Neg for blurry vision.   Ears, nose, mouth, throat: Neg for sore throat  Cardiovascular: Neg for chest pain.   Respiratory: Neg for cough. Neg for shortness of breath.   Gastrointestinal: Pos for abdominal pain. Pos for nausea and vomiting.   Genitourinary: Neg for dysuria. Neg for hematuria. Pos for vaginal discharge.   Musculoskeletal: Neg for joint pain. Neg for swelling   Skin: Neg for rashes  Neurological: Neg for focal numbness or weakness    Home Medications:    Current Facility-Administered Medications:     acetaminophen (TYLENOL) tablet 650 mg, 650 mg, Oral, Q6H PRN, Wyline Beady, MD, 650 mg at 05/14/22 2139    aspirin chewable tablet 81 mg, 81 mg, Oral, Daily, Tosi, Harlen Labs, MD    azathioprine (IMURAN) tablet 100 mg, 100 mg, Oral, Daily, Tosi, Amanda C, MD    ciprofloxacin (CIPRO) 2 mg/mL in dextrose 5% IVPB 400 mg, 400 mg, Intravenous, Q12H SCH, Wyline Beady, MD, Stopped at 05/15/22 0734    guaiFENesin (ROBITUSSIN) oral syrup, 200 mg, Oral, Q4H PRN, Annell Greening, Amanda C, MD    melatonin tablet 3 mg, 3 mg, Oral, Nightly PRN, Wyline Beady, MD    metroNIDAZOLE (FLAGYL) IVPB 500 mg, 500 mg, Intravenous, BID, Wyline Beady, MD, Stopped at 05/14/22 2202    ondansetron (ZOFRAN-ODT) disintegrating tablet 4  mg, 4 mg, Oral, Q8H PRN, Wyline Beady, MD, 4 mg at 05/14/22 1816    sodium bicarbonate tablet 1,300 mg, 1,300 mg, Oral, TID, Wyline Beady, MD, 1,300 mg at 05/14/22 2056    tacrolimus (ENVARSUS XR) extended release tablet 18 mg, 18 mg, Oral, Daily, Tosi, Harlen Labs, MD    Current Outpatient Medications:     aspirin (ECOTRIN) 81 MG tablet, Take 1 tablet (81 mg total) by mouth daily., Disp: 30 tablet, Rfl: 11    azathioprine (IMURAN) 50 mg tablet, Take 2 tablets (100 mg total) by mouth daily, Disp: 60 tablet, Rfl: 11    loperamide (IMODIUM) 2 mg capsule, Take 1 capsule (2 mg total) by mouth two (2) times a day as needed for diarrhea., Disp: , Rfl:     sodium bicarbonate 650 mg tablet, Take 2 tablets (1,300 mg total) by mouth Three (3) times a day., Disp: 180 tablet, Rfl: 3    tacrolimus (ENVARSUS XR) 1 mg Tb24 extended release tablet, Take 2 tablets (2 mg total) by mouth in the morning. Take FOUR 4 mg tablets (16 mg total) and TWO 1 mg tablets (2 mg total) for a total dose of 18 mg daily., Disp: 60 tablet, Rfl: 11    tacrolimus (ENVARSUS XR) 4 mg Tb24 extended release tablet, Take 4 tablets (16 mg total) by mouth daily. Take FOUR 4 mg tablets (16 mg total) and TWO 1 mg tablets (2 mg total) for a total dose of 18 mg daily, Disp: 120 tablet, Rfl: 11    ALLERGIES:   Iodinated contrast media, Nickel, Propranolol, Eye irrigating solution [ophthalmic irrigation solution], Iodine, Naltrexone, and Uni-cortrom       Physical Exam     ED Triage Vitals   Enc Vitals Group      BP 05/14/22 1030 118/65      Heart Rate 05/14/22 1012 96      SpO2 Pulse --       Resp 05/14/22 1030 16      Temp 05/14/22 1032 36.8 ??C (98.2 ??F)      Temp Source 05/14/22 1032 Oral      SpO2 05/14/22 1012 98 %      Weight 05/14/22 1032 81.6 kg (180 lb)     Vital signs reviewed.  GEN: AOx3. Well-nourished, well-developed. No apparent distress. Speaking in full sentences.  EYES: EOM grossly intact. Pupils round and reactive. No scleral icterus. Clear conjunctiva.  HEAD/NECK /MOUTH: [place]/AT. No pharyngeal erythema. Moist mucous membranes.  CARDIO: RRR. No peripheral edema. Normal capillary refill.  LUNG: CTAB. No retractions or accessory muscle use  GI: Non-distended. Mild TTP in all 4 quadrants without masses, rigidity, guarding, rebound, or overlying skin changes. Negative Murphy's sign. Negative McBurney's point tenderness.  MSK: Full range of motion. Moving all extremities. No swelling or deformity.  SKIN: Intact. No rashes. No lesions. No hematomas, no ecchymosis, no petechiae.  NEURO: AOx3. No facial asymmetry; no focal motor or sensory deficits.  PSYCH: Awake and alert. Appropriate mood and affect.    LABS AND ED TREATMENT:   Lab reviewed and interpreted by me as above in MDM.   Medications given in the ED:   Medications   tacrolimus (ENVARSUS XR) extended release tablet 18 mg (has no administration in time range)   sodium bicarbonate tablet 1,300 mg (1,300 mg Oral Given 05/14/22 2056)   azathioprine (IMURAN) tablet 100 mg (has no administration in time range)   aspirin chewable tablet 81 mg (has no administration in  time range)   acetaminophen (TYLENOL) tablet 650 mg (650 mg Oral Given 05/14/22 2139)   melatonin tablet 3 mg (has no administration in time range)   guaiFENesin (ROBITUSSIN) oral syrup (has no administration in time range)   metroNIDAZOLE (FLAGYL) IVPB 500 mg (0 mg Intravenous Stopped 05/14/22 2202)   ciprofloxacin (CIPRO) 2 mg/mL in dextrose 5% IVPB 400 mg (0 mg Intravenous Stopped 05/15/22 0734)   ondansetron (ZOFRAN-ODT) disintegrating tablet 4 mg (4 mg Oral Given 05/14/22 1816)   ciprofloxacin (CIPRO) 2 mg/mL in dextrose 5% IVPB 400 mg (0 mg Intravenous Stopped 05/14/22 1716)   sodium chloride 0.9% (NS) bolus 500 mL (0 mL Intravenous Stopped 05/14/22 1954)          Radiology     No orders to display       I independently visualized the radiology images.       Portions of this record have been created using Scientist, clinical (histocompatibility and immunogenetics). Dictation errors have been sought, but may not have been identified and corrected.     Documentation assistance was provided by Johnathan Hausen, Scribe, on May 14, 2022 at 10:50 AM for Sabino Gasser, MD.     Sabino Gasser, MD  05/15/22 [phone]

## 2022-05-14 NOTE — Unmapped (Signed)
Pt here with generalized abd pain x several weeks, endorsing nausea as well.

## 2022-05-15 LAB — CBC
HEMATOCRIT: 25.4 % — ABNORMAL LOW (ref 34.0–44.0)
HEMOGLOBIN: 9 g/dL — ABNORMAL LOW (ref 11.3–14.9)
MEAN CORPUSCULAR HEMOGLOBIN CONC: 35.4 g/dL (ref 32.0–36.0)
MEAN CORPUSCULAR HEMOGLOBIN: 37.8 pg — ABNORMAL HIGH (ref 25.9–32.4)
MEAN CORPUSCULAR VOLUME: 106.8 fL — ABNORMAL HIGH (ref 77.6–95.7)
MEAN PLATELET VOLUME: 8.2 fL (ref 6.8–10.7)
PLATELET COUNT: 143 10*9/L — ABNORMAL LOW (ref 150–450)
RED BLOOD CELL COUNT: 2.38 10*12/L — ABNORMAL LOW (ref 3.95–5.13)
RED CELL DISTRIBUTION WIDTH: 14.6 % (ref 12.2–15.2)
WBC ADJUSTED: 3.6 10*9/L (ref 3.6–11.2)

## 2022-05-15 LAB — BASIC METABOLIC PANEL
ANION GAP: 6 mmol/L (ref 5–14)
BLOOD UREA NITROGEN: 22 mg/dL (ref 9–23)
BUN / CREAT RATIO: 11
CALCIUM: 9.3 mg/dL (ref 8.7–10.4)
CHLORIDE: 112 mmol/L — ABNORMAL HIGH (ref 98–107)
CO2: 25 mmol/L (ref 20.0–31.0)
CREATININE: 2.03 mg/dL — ABNORMAL HIGH
EGFR CKD-EPI (2021) FEMALE: 32 mL/min/{1.73_m2} — ABNORMAL LOW (ref >=60)
GLUCOSE RANDOM: 100 mg/dL (ref 70–179)
POTASSIUM: 4.8 mmol/L (ref 3.5–5.1)
SODIUM: 143 mmol/L (ref 135–145)

## 2022-05-15 LAB — IRON PANEL
IRON SATURATION: 38 % (ref 20–55)
IRON: 78 ug/dL
TOTAL IRON BINDING CAPACITY: 203 ug/dL — ABNORMAL LOW (ref 250–425)

## 2022-05-15 LAB — TACROLIMUS LEVEL, TIMED
TACROLIMUS BLOOD: 7.5 ng/mL
TACROLIMUS BLOOD: 9.7 ng/mL

## 2022-05-15 LAB — VITAMIN B12: VITAMIN B-12: 901 pg/mL (ref 211–911)

## 2022-05-15 LAB — FOLATE: FOLATE: 9.3 ng/mL (ref >=5.4)

## 2022-05-15 LAB — FERRITIN: FERRITIN: 373.2 ng/mL — ABNORMAL HIGH

## 2022-05-15 MED ADMIN — sodium bicarbonate tablet 1,300 mg: 1300 mg | ORAL | @ 13:00:00

## 2022-05-15 MED ADMIN — aspirin chewable tablet 81 mg: 81 mg | ORAL | @ 13:00:00

## 2022-05-15 MED ADMIN — azathioprine (IMURAN) tablet 100 mg: 100 mg | ORAL | @ 13:00:00

## 2022-05-15 MED ADMIN — tacrolimus (ENVARSUS XR) extended release tablet 18 mg: 18 mg | ORAL | @ 12:00:00

## 2022-05-15 MED ADMIN — lactated ringers bolus 1,000 mL: 1000 mL | INTRAVENOUS | @ 15:00:00 | Stop: 2022-05-15

## 2022-05-15 MED ADMIN — sodium bicarbonate tablet 1,300 mg: 1300 mg | ORAL | @ 01:00:00

## 2022-05-15 MED ADMIN — acetaminophen (TYLENOL) tablet 650 mg: 650 mg | ORAL | @ 02:00:00

## 2022-05-15 MED ADMIN — ciprofloxacin (CIPRO) 2 mg/mL in dextrose 5% IVPB 400 mg: 400 mg | INTRAVENOUS | @ 21:00:00 | Stop: 2022-05-21

## 2022-05-15 MED ADMIN — sodium bicarbonate tablet 1,300 mg: 1300 mg | ORAL | @ 18:00:00

## 2022-05-15 MED ADMIN — metroNIDAZOLE (FLAGYL) IVPB 500 mg: 500 mg | INTRAVENOUS | @ 01:00:00 | Stop: 2022-05-21

## 2022-05-15 MED ADMIN — metroNIDAZOLE (FLAGYL) IVPB 500 mg: 500 mg | INTRAVENOUS | @ 13:00:00 | Stop: 2022-05-21

## 2022-05-15 MED ADMIN — ciprofloxacin (CIPRO) 2 mg/mL in dextrose 5% IVPB 400 mg: 400 mg | INTRAVENOUS | @ 10:00:00 | Stop: 2022-05-21

## 2022-05-15 NOTE — Unmapped (Signed)
Tacrolimus Therapeutic Monitoring Pharmacy Note    Shawon Latchford is a 37 y.o. female continuing tacrolimus.     Indication:  Kidney and pancreas transplant      Date of Transplant:  2021       Prior Dosing Information: Home regimen Envarsus 18 mg daily      Source(s) of information used to determine prior to admission dosing: Home Medication List, Clinic Note, or Fill HIstory    Goals:  Therapeutic Drug Levels  Tacrolimus trough goal: 8-10 ng/mL    Additional Clinical Monitoring/Outcomes  Monitor renal function (SCr and urine output) and liver function (LFTs)  Monitor for signs/symptoms of adverse events (e.g., hyperglycemia, hyperkalemia, hypomagnesemia, hypertension, headache, tremor)    Results:   Tacrolimus level: Not applicable    Pharmacokinetic Considerations and Significant Drug Interactions:  Concurrent hepatotoxic medications: None identified  Concurrent CYP3A4 substrates/inhibitors: None identified  Concurrent nephrotoxic medications: None identified    Assessment/Plan:  Recommendedation(s)  Continue current regimen of Envarsus 18 mg daily    Follow-up  Daily levels .   A pharmacist will continue to monitor and recommend levels as appropriate    Please page service pharmacist with questions/clarifications.    Deanna Artis, PharmD

## 2022-05-15 NOTE — Unmapped (Signed)
Nephrology ([facility]) Progress Note    Assessment & Plan:   Elaine Koch is a 37 y.o. 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      Principal Problem:    Diarrhea of presumed infectious origin  Active Problems:    Abdominal pain    History of simultaneous kidney and pancreas transplant (CMS-HCC)    BV (bacterial vaginosis)  Resolved Problems:    * No resolved hospital problems. *      Active Problems    Subacute diarrhea, abdominal pain, nausea c/f infectious process  Presenting with 1.5 month intermittent watery diarrhea, nausea w/ emesis x 2, cramping pelvic pain which is now epigastric and particularly tender over L renal graft and R pancreas graft. Patient has a history of recurrent C. diff infections (s/p fecal transplant May 2022) and feels this is similar, however her C dif is negative on admission and on 3/22. Multiple ER visits with work-up showing negative CMV PCR and reassuring non-contrast CTAP on 04/25/22; positive enteropathogenic E. coli on 05/03/22. She is non-toxic appearing and comfortable with abdominal pain on palpation as described above. Will treat for enteropathogenic E. coli due to prolonged duration of symptoms in immunosuppressed patient. CMV is also on the differential given immunosuppression and transplant status, but less likely. If symptoms not improving with antibiotic treatment, can consider further GI work-up for malabsorption. She did have previous colonoscopy in 2022 for fecal transplant which appeared normal on brief inspection but no biopsies taken.   - Daily BMP, CBC  - Follow up UCx, Bcx (UA unremarkable), GIPP  - Follow up stool osmolality and lytes, cosyntropin test tomorrow am  - IV Ciprofloxacin 400mg  q12H (4/2-4/8) for total of 7 days  - Plan for IV bolus of 1L NS   - Zofran prn; normal QTc   - Avoid imodium  - If no improvement, will consult ICID  - Appreciate input from transplant nephro      History of Kidney-Pancreas Transplant (05/03/19) - Immunosuppression   Follows with Dr. Carlene Coria in transplant nephro. S/p kidney & pancreas transplant in 2022 for Type 1 diabetes. Serum creatinine level is at/near baseline. Prior baseline 1.3-1.6, but since mid-2023, it is closer to 1.8. Normal lipase, amylase on baseline. Reportedly making good urine and has not missed any immunosuppression.   - Daily BMP, strict I&O  - Tac goal level 8-10 (higher goal due to pancreas transplant), daily tac levels   - Continue home Envarsus 18 mg daily  - Continue Azathioprine 100mg  daily (not on mycophenolate due to childbearing concerns)  - Bicarb 1300mg  TID     Active, symptomatic BV with frequent recurrence   Symptoms of vaginal discharge, itching, odor with positive vaginitis screen on admission. Pt reports frequent BV flares which resolve with PO flagyl (more effective than cream). Trich, chlamydia, gonorrhea neg.  - IV Flagyl 500mg  BID (4/2-4/8) for total of 7 days, suspect she will tolerate better than PO but can switch on discharge     Chronic Problems    Macrocytic Anemia  Patient's labs on admission significant for macrocytic anemia with MCV of 107. Her baseline MCV seems to be elevated around 109. This is likely a result of her home azathioprine. However, given her acute on chronic GI symptoms, will evaluate her for vitamin deficiencies, malabsorption. Iron studies  consistent with concurrent anemia of chronic disease. Normal B12 and folate.      Hx Type 1 DM    Now cured, s/p pancreas transplant in 2022. BMP nl.        The patient's presentation is complicated by the following clinically significant conditions requiring additional evaluation and treatment: - Chronic kidney disease POA requiring further investigation, treatment, or monitoring       Issues Impacting Complexity of Management:  -The patient is at high risk of complications from IV antibiotics      Daily Checklist:  Diet: Regular Diet  DVT PPx: Not Indicated - Padua Score <4  Code Status: Full Code  Dispo: Patient appropriate for Inpatient based on expectation of ongoing need for hospitalization greater than two midnights based on severity of presentation/services including IV antibiotics    Team Contact Information:   Primary Team: Nephrology ([facility])  Primary Resident: Colonel Bald, MD  Resident's Pager: [phone] (Nephrology Intern - Tower)    Interval History:   No acute events overnight. Patient reports improved PO intake. One episode of nausea which resolved with Zofran. Denies emesis or diarrhea. Normal UOP. Patient states that right-sided abdominal pain resolved. Still endorsing epigastric pain and pain over left-sided kidney graft site.       Objective:   Temp:  [36.8 ??C (98.2 ??F)-37 ??C (98.6 ??F)] 36.8 ??C (98.2 ??F)  Heart Rate:  [70-95] 95  SpO2 Pulse:  [74] 74  Resp:  [16-20] 20  BP: (128-157)/(80-94) 128/82  SpO2:  [97 %-100 %] 100 %    Gen: NAD, converses   HENT: atraumatic, normocephalic  Heart: RRR  Lungs: CTAB, no crackles or wheezes  Abdomen: soft, mild TTP in the epigastric region and in the left lower abdomen over her kidney graft site  Extremities: No edema    Elaine Koch, MS4    I attest that I have reviewed the student note and that the components of the history of the present illness, the physical exam, and the assessment and plan documented were performed by me or were performed in my presence by the student where I verified the documentation and performed (or re-performed) the exam and medical decision making.     Colonel Bald, MD  Internal Medicine/Pediatrics  PGY1

## 2022-05-16 LAB — BASIC METABOLIC PANEL
ANION GAP: 7 mmol/L (ref 5–14)
BLOOD UREA NITROGEN: 17 mg/dL (ref 9–23)
BUN / CREAT RATIO: 8
CALCIUM: 9.1 mg/dL (ref 8.7–10.4)
CHLORIDE: 112 mmol/L — ABNORMAL HIGH (ref 98–107)
CO2: 23 mmol/L (ref 20.0–31.0)
CREATININE: 2.1 mg/dL — ABNORMAL HIGH
EGFR CKD-EPI (2021) FEMALE: 31 mL/min/{1.73_m2} — ABNORMAL LOW (ref >=60)
GLUCOSE RANDOM: 102 mg/dL (ref 70–179)
POTASSIUM: 4.6 mmol/L (ref 3.4–4.8)
SODIUM: 142 mmol/L (ref 135–145)

## 2022-05-16 LAB — ADDON DIFFERENTIAL ONLY
BASOPHILS ABSOLUTE COUNT: 0 10*9/L (ref 0.0–0.1)
BASOPHILS RELATIVE PERCENT: 0.9 %
EOSINOPHILS ABSOLUTE COUNT: 1.9 10*9/L — ABNORMAL HIGH (ref 0.0–0.5)
EOSINOPHILS RELATIVE PERCENT: 57.5 %
LYMPHOCYTES ABSOLUTE COUNT: 0.4 10*9/L — ABNORMAL LOW (ref 1.1–3.6)
LYMPHOCYTES RELATIVE PERCENT: 13.2 %
MONOCYTES ABSOLUTE COUNT: 0.2 10*9/L — ABNORMAL LOW (ref 0.3–0.8)
MONOCYTES RELATIVE PERCENT: 4.5 %
NEUTROPHILS ABSOLUTE COUNT: 0.8 10*9/L — ABNORMAL LOW (ref 1.8–7.8)
NEUTROPHILS RELATIVE PERCENT: 23.9 %

## 2022-05-16 LAB — CBC
HEMATOCRIT: 25.4 % — ABNORMAL LOW (ref 34.0–44.0)
HEMOGLOBIN: 8.9 g/dL — ABNORMAL LOW (ref 11.3–14.9)
MEAN CORPUSCULAR HEMOGLOBIN CONC: 35.2 g/dL (ref 32.0–36.0)
MEAN CORPUSCULAR HEMOGLOBIN: 37.5 pg — ABNORMAL HIGH (ref 25.9–32.4)
MEAN CORPUSCULAR VOLUME: 106.7 fL — ABNORMAL HIGH (ref 77.6–95.7)
MEAN PLATELET VOLUME: 8.9 fL (ref 6.8–10.7)
PLATELET COUNT: 145 10*9/L — ABNORMAL LOW (ref 150–450)
RED BLOOD CELL COUNT: 2.38 10*12/L — ABNORMAL LOW (ref 3.95–5.13)
RED CELL DISTRIBUTION WIDTH: 14.3 % (ref 12.2–15.2)
WBC ADJUSTED: 3.4 10*9/L — ABNORMAL LOW (ref 3.6–11.2)

## 2022-05-16 LAB — SLIDE REVIEW

## 2022-05-16 MED ORDER — METRONIDAZOLE 500 MG TABLET
ORAL_TABLET | Freq: Two times a day (BID) | ORAL | 0 refills | 5 days | Status: CP
Start: 2022-05-16 — End: ?

## 2022-05-16 MED ADMIN — ciprofloxacin (CIPRO) 2 mg/mL in dextrose 5% IVPB 400 mg: 400 mg | INTRAVENOUS | @ 09:00:00 | Stop: 2022-05-16

## 2022-05-16 MED ADMIN — sodium bicarbonate tablet 1,300 mg: 1300 mg | ORAL

## 2022-05-16 MED ADMIN — azathioprine (IMURAN) tablet 100 mg: 100 mg | ORAL | @ 13:00:00 | Stop: 2022-05-16

## 2022-05-16 MED ADMIN — metroNIDAZOLE (FLAGYL) IVPB 500 mg: 500 mg | INTRAVENOUS | Stop: 2022-05-21

## 2022-05-16 MED ADMIN — metroNIDAZOLE (FLAGYL) tablet 500 mg: 500 mg | ORAL | @ 14:00:00 | Stop: 2022-05-16

## 2022-05-16 MED ADMIN — sodium bicarbonate tablet 1,300 mg: 1300 mg | ORAL | @ 13:00:00 | Stop: 2022-05-16

## 2022-05-16 MED ADMIN — aspirin chewable tablet 81 mg: 81 mg | ORAL | @ 13:00:00 | Stop: 2022-05-16

## 2022-05-16 MED ADMIN — tacrolimus (ENVARSUS XR) extended release tablet 18 mg: 18 mg | ORAL | @ 13:00:00 | Stop: 2022-05-16

## 2022-05-16 MED ADMIN — acetaminophen (TYLENOL) tablet 650 mg: 650 mg | ORAL

## 2022-05-16 NOTE — Unmapped (Addendum)
Patient is alert and oriented,C/O abdominal pain,prn tylenol given with good effect,complained of itchiness on her left arm where her IV is while receiving IV Cipro,infusion stopped,MD informed,gave an ok to stop the med and will refer to day team.,fall precaution in place,call bell within reach.   Problem: Adult Inpatient Plan of Care  Goal: Plan of Care Review  Outcome: Ongoing - Unchanged  Goal: Patient-Specific Goal (Individualized)  Outcome: Ongoing - Unchanged  Goal: Absence of Hospital-Acquired Illness or Injury  Outcome: Ongoing - Unchanged  Intervention: Identify and Manage Fall Risk  Recent Flowsheet Documentation  Taken 05/15/2022 1950 by Hughes Better, RN  Safety Interventions: fall reduction program maintained  Intervention: Prevent Skin Injury  Recent Flowsheet Documentation  Taken 05/15/2022 1950 by Hughes Better, RN  Positioning for Skin: Left  Device Skin Pressure Protection: adhesive use limited  Skin Protection: adhesive use limited  Intervention: Prevent Infection  Recent Flowsheet Documentation  Taken 05/15/2022 1950 by Hughes Better, RN  Infection Prevention: hand hygiene promoted  Goal: Optimal Comfort and Wellbeing  Outcome: Ongoing - Unchanged  Goal: Readiness for Transition of Care  Outcome: Ongoing - Unchanged  Goal: Rounds/Family Conference  Outcome: Ongoing - Unchanged     Problem: Kidney Transplant  Goal: Optimal Coping with Organ Transplant  Outcome: Ongoing - Unchanged  Goal: Effective Renal Function  Outcome: Ongoing - Unchanged  Goal: Fluid and Electrolyte Balance  Outcome: Ongoing - Unchanged  Goal: Absence of Infection Signs and Symptoms  Outcome: Ongoing - Unchanged  Intervention: Prevent or Manage Infection  Recent Flowsheet Documentation  Taken 05/15/2022 1950 by Hughes Better, RN  Infection Management: aseptic technique maintained  Infection Prevention: hand hygiene promoted  Goal: Optimal Pain Control and Function  Outcome: Ongoing - Unchanged  Goal: Nausea and Vomiting Relief  Outcome: Ongoing - Unchanged     Problem: Anemia  Goal: Anemia Symptom Improvement  Outcome: Ongoing - Unchanged  Intervention: Monitor and Manage Anemia  Recent Flowsheet Documentation  Taken 05/15/2022 1950 by Hughes Better, RN  Safety Interventions: fall reduction program maintained

## 2022-05-16 NOTE — Unmapped (Signed)
Pt came from ED. Finished 1L LR. Give IV Abx. Report no abd pain, no N/V during the shift. Last Vomit is before admission. Last BM is yesterday morning witch is diarrhea. Still wait for stool sample. Self care.

## 2022-05-16 NOTE — Unmapped (Signed)
Physician Discharge Summary [facility]  [address] [facility]  [address]  [place] [address]  Dept: [phone]  Loc: [phone]     Identifying Information:   Elaine Koch  1985/05/17  [id number]    Primary Care Physician: Pcp, None Per Patient     Code Status: Full Code    Admit Date: 05/14/2022    Discharge Date: 05/16/2022     Discharge To: Home    Discharge Service: [facility] - Nephrology Floor Team (MED B - Tower)     Discharge Attending Physician: Sharlett Iles, MD    Discharge Diagnoses:  Principal Problem:    Chronic diarrhea of unknown origin (POA: Unknown)  Active Problems:    Abdominal pain (POA: Yes)    History of simultaneous kidney and pancreas transplant (CMS-HCC) (POA: Not Applicable)    BV (bacterial vaginosis) (POA: Unknown)  Resolved Problems:    * No resolved hospital problems. *      Hospital Course:   Elaine Koch is a 37 y.o. 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 Below is a summary of her hospital course by problem.    Subacute Diarrhea, Abdominal Pain, Nausea, C/f Infectious Process  Patient presented with 1.5 months of intermittent watery diarrhea, nausea w/ emesis x2, cramping pelvic pain. On exam, patient also had epigastric pain and tenderness over L renal graft and R pancreas graft. Prior to her current admission, patient had multiple ER visits with work-up showing negative CMV PCR and reassuring non-contrast CTAP on 3/14; positive enteropathogenic E. coli on 3/22. Patient felt like her symptoms were similar to her previous C. diff infections, though C. diff testing was negative on admission. Given duration of symptoms in immunosuppressed patient, patient was treated with IV Cipro for EPEC starting on 4/2 while GIPP was pending. On 4/4, GIPP results were negative and Cipro was discontinued. Patient did not have diarrhea during her admission. Given improvement in abdominal pain and diarrhea symptoms, patient was discharged with plan for hospital follow-up with Nephrology and GI referral for further work-up if symptoms recur, including stool osmotic gap and cosyntropin testing, as well as possible lower endoscopy to assess for eosinophilic colitis 2/2 azathioprine. Patient was instructed to keep a food diary to bring to her appointments.     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 Carlene Coria in Transplant Nephrology. She is s/p kidney and pancreas transplant in 2022 for T1DM. On admission, serum Cr was at/near her baseline. Prior baseline 1.3-1.6, but since mid-2023, it has been closer to 1.8. Patient's lipase was normal, and her amylase was at her baseline. Patient received IV fluids and was continued on her home Envarsus 18mg  daily, Azathioprine 100mg  daily, and Bicarb 1300mg  TID.     Active, Symptomatic BV with Frequent Recurrence  Patient has a history of recurrent BV infections including prior to and after her transplants. Patient endorsed symptoms of vaginal discharge, itching, odor with positive vaginitis screen on admission. Tric, chlamydia, gonorrhea negative. Patient was started on IV Flagyl 500mg  BID and discharged on oral Flagyl for a total of 7 days of therapy.     Macrocytic Anemia   Patient's labs on admission significant for macrocytic anemia with MCV of 107. Her baseline MCV seems to be  elevated around 109. This is suspected to be due to her home azathioprine. B12 and folate levels were normal. Iron studies were consistent with concurrent anemia of chronic disease.     Peripheral Eosinophilia  Patient's labs on admission were significant for a new eosinophil % of 55.8 and absolute eosinophil count of 2.1. Chronic diarrhea is a possible presenting symptom of adrenal insufficiency, however, patient did not have electrolyte or blood pressure abnormalities. Patient endorses bad seasonal allergies, which could also explain her current peripheral eosinophilia. Parasitic infections were considered but less likely given lack of exposures and travel history. Also on the differential is eosinophilic colitis due to azathioprine, with plan for outpatient work-up with lower endoscopy if GI symptoms recur.     The patient's hospital stay has been complicated by the following clinically significant conditions requiring additional evaluation and treatment or having a significant effect of this patient's care: - Chronic kidney disease POA requiring further investigation, treatment, or monitoring               Outpatient Provider Follow Up Issues:   [ ]  Patient will need GI follow-up regarding chronic watery diarrhea, abdominal cramping, and peripheral eosinophilia. Would consider further work-up, including stool electrolytes, cosyntropin stimulation testing, endoscopy to rule out eosinophilic colitis from azathioprine, Celiac, or Whipple disease.   [ ]  Patient will also need PCP follow-up regarding recurrent BV and current BV episode treated with Flagyl.       Touchbase with Outpatient Provider:  Warm Handoff: Completed on 05/16/22 by Colonel Bald, MD  (Intern) via [facility] Message    Procedures:  None  ______________________________________________________________________  Discharge Medications:     Your Medication List        STOP taking these medications      loperamide 2 mg capsule  Commonly known as: IMODIUM            START taking these medications      metroNIDAZOLE 500 MG tablet  Commonly known as: FLAGYL  Take 1 tablet (500 mg total) by mouth two (2) times a day.            CONTINUE taking these medications      aspirin 81 MG tablet  Commonly known as: ECOTRIN  Take 1 tablet (81 mg total) by mouth daily.     azathioprine 50 mg tablet  Commonly known as: IMURAN  Take 2 tablets (100 mg total) by mouth daily     ENVARSUS XR 1 mg Tb24 extended release tablet  Generic drug: tacrolimus  Take 2 tablets (2 mg total) by mouth in the morning. Take FOUR 4 mg tablets (16 mg total) and TWO 1 mg tablets (2 mg total) for a total dose of 18 mg daily.     ENVARSUS XR 4 mg Tb24 extended release tablet  Generic drug: tacrolimus  Take 4 tablets (16 mg total) by mouth daily. Take FOUR 4 mg tablets (16 mg total) and TWO 1 mg tablets (2 mg total) for a total dose of 18 mg daily     sodium bicarbonate 650 mg tablet  Take 2 tablets (1,300 mg total) by mouth Three (3) times a day.              Allergies:  Iodinated contrast media, Nickel, Propranolol, Eye irrigating solution [ophthalmic irrigation solution], Iodine, Naltrexone, and Uni-cortrom  ______________________________________________________________________  Pending Test Results:  Pending Labs       Order Current Status    Vitamin D 25 Hydroxy (25OH  D2 + D3) In process    Blood Culture Preliminary result    Blood Culture Preliminary result            Most Recent Labs:  All lab results last 24 hours -   Recent Results (from the past 24 hour(s))   Basic metabolic panel    Collection Time: 05/16/22  5:56 AM   Result Value Ref Range    Sodium 142 135 - 145 mmol/L    Potassium 4.6 3.4 - 4.8 mmol/L    Chloride 112 (H) 98 - 107 mmol/L    CO2 23.0 20.0 - 31.0 mmol/L    Anion Gap 7 5 - 14 mmol/L    BUN 17 9 - 23 mg/dL    Creatinine 3.24 (H) 0.55 - 1.02 mg/dL    BUN/Creatinine Ratio 8     eGFR CKD-EPI (2021) Female 31 (L) >=60 mL/min/1.22m2    Glucose 102 70 - 179 mg/dL    Calcium 9.1 8.7 - 40.1 mg/dL   CBC    Collection Time: 05/16/22  5:56 AM   Result Value Ref Range    WBC 3.4 (L) 3.6 - 11.2 10*9/L    RBC 2.38 (L) 3.95 - 5.13 10*12/L    HGB 8.9 (L) 11.3 - 14.9 g/dL    HCT 02.7 (L) 25.3 - 44.0 %    MCV 106.7 (H) 77.6 - 95.7 fL    MCH 37.5 (H) 25.9 - 32.4 pg    MCHC 35.2 32.0 - 36.0 g/dL    RDW 66.4 40.3 - 47.4 %    MPV 8.9 6.8 - 10.7 fL    Platelet 145 (L) 150 - 450 10*9/L   Addon Differential Only    Collection Time: 05/16/22  5:56 AM   Result Value Ref Range    Neutrophils % 23.9 %    Lymphocytes % 13.2 %    Monocytes % 4.5 %    Eosinophils % 57.5 %    Basophils % 0.9 %    Absolute Neutrophils 0.8 (L) 1.8 - 7.8 10*9/L    Absolute Lymphocytes 0.4 (L) 1.1 - 3.6 10*9/L    Absolute Monocytes 0.2 (L) 0.3 - 0.8 10*9/L    Absolute Eosinophils 1.9 (H) 0.0 - 0.5 10*9/L    Absolute Basophils 0.0 0.0 - 0.1 10*9/L    Macrocytosis Slight (A) Not Present   Morphology Review    Collection Time: 05/16/22  5:56 AM   Result Value Ref Range    Smear Review Comments See Comment (A) Undefined       Relevant Studies/Radiology:  ECG 12 Lead    Result Date: 05/16/2022  NORMAL SINUS RHYTHM POSSIBLE LEFT ATRIAL ENLARGEMENT LOW VOLTAGE QRS BORDERLINE ECG WHEN COMPARED WITH ECG OF 01-Feb-2022 10:41, NO SIGNIFICANT CHANGE WAS FOUND Confirmed by Mariane Baumgarten ([id number]) on 05/16/2022 9:27:11 AM   ______________________________________________________________________  Discharge Instructions:   Activity Instructions       Activity as tolerated                       Follow Up instructions and Outpatient Referrals     Ambulatory referral to Gastroenterology      Discharge instructions            ______________________________________________________________________  Discharge Day Services:  BP 137/79  - Pulse 76  - Temp 36.8 ??C (98.2 ??F) (Oral)  - Resp 18  - Ht 165.1 cm (5' 5)  - Wt 81.6 kg (180 lb)  - LMP  (  LMP Unknown) Comment: irregular - SpO2 100%  - BMI 29.95 kg/m??     Pt seen on the day of discharge and determined appropriate for discharge.    Condition at Discharge: good    Length of Discharge: I spent greater than 30 mins in the discharge of this patient.

## 2022-05-17 LAB — VITAMIN D 25 HYDROXY: VITAMIN D, TOTAL (25OH): 40.3 ng/mL (ref 20.0–80.0)

## 2022-05-20 DIAGNOSIS — Z940 Kidney transplant status: Principal | ICD-10-CM

## 2022-05-20 DIAGNOSIS — Z79899 Other long term (current) drug therapy: Principal | ICD-10-CM

## 2022-05-21 DIAGNOSIS — Z79899 Other long term (current) drug therapy: Principal | ICD-10-CM

## 2022-05-21 DIAGNOSIS — Z940 Kidney transplant status: Principal | ICD-10-CM

## 2022-05-21 DIAGNOSIS — E119 Type 2 diabetes mellitus without complications: Principal | ICD-10-CM

## 2022-05-21 DIAGNOSIS — Z9483 Pancreas transplant status: Principal | ICD-10-CM

## 2022-05-27 DIAGNOSIS — Z940 Kidney transplant status: Principal | ICD-10-CM

## 2022-05-27 DIAGNOSIS — Z79899 Other long term (current) drug therapy: Principal | ICD-10-CM

## 2022-05-30 ENCOUNTER — Ambulatory Visit: Admit: 2022-05-30 | Discharge: 2022-05-31 | Payer: MEDICARE | Attending: Nephrology | Primary: Nephrology

## 2022-05-30 ENCOUNTER — Encounter: Admit: 2022-05-30 | Discharge: 2022-05-31 | Payer: MEDICARE | Attending: Nephrology | Primary: Nephrology

## 2022-05-30 ENCOUNTER — Ambulatory Visit: Admit: 2022-05-30 | Discharge: 2022-05-31 | Payer: MEDICARE

## 2022-05-30 DIAGNOSIS — Z79899 Other long term (current) drug therapy: Principal | ICD-10-CM

## 2022-05-30 DIAGNOSIS — Z9483 Pancreas transplant status: Principal | ICD-10-CM

## 2022-05-30 DIAGNOSIS — E119 Type 2 diabetes mellitus without complications: Principal | ICD-10-CM

## 2022-05-30 DIAGNOSIS — Z940 Kidney transplant status: Principal | ICD-10-CM

## 2022-05-30 LAB — URINALYSIS WITH MICROSCOPY
BILIRUBIN UA: NEGATIVE
BLOOD UA: NEGATIVE
GLUCOSE UA: NEGATIVE
HYALINE CASTS: 1 /LPF (ref 0–1)
KETONES UA: NEGATIVE
LEUKOCYTE ESTERASE UA: NEGATIVE
NITRITE UA: NEGATIVE
PH UA: 5.5 (ref 5.0–9.0)
PROTEIN UA: NEGATIVE
RBC UA: <1 /HPF (ref <=4)
SPECIFIC GRAVITY UA: 1.012 (ref 1.003–1.030)
SQUAMOUS EPITHELIAL: 2 /HPF (ref 0–5)
UROBILINOGEN UA: <2
WBC UA: <1 /HPF (ref 0–5)

## 2022-05-30 LAB — CBC W/ AUTO DIFF
BASOPHILS ABSOLUTE COUNT: 0 10*9/L (ref 0.0–0.1)
BASOPHILS RELATIVE PERCENT: 1 %
EOSINOPHILS ABSOLUTE COUNT: 1.2 10*9/L — ABNORMAL HIGH (ref 0.0–0.5)
EOSINOPHILS RELATIVE PERCENT: 43.1 %
HEMATOCRIT: 24.4 % — ABNORMAL LOW (ref 34.0–44.0)
HEMOGLOBIN: 8.5 g/dL — ABNORMAL LOW (ref 11.3–14.9)
LYMPHOCYTES ABSOLUTE COUNT: 0.5 10*9/L — ABNORMAL LOW (ref 1.1–3.6)
LYMPHOCYTES RELATIVE PERCENT: 20.1 %
MEAN CORPUSCULAR HEMOGLOBIN CONC: 34.9 g/dL (ref 32.0–36.0)
MEAN CORPUSCULAR HEMOGLOBIN: 38 pg — ABNORMAL HIGH (ref 25.9–32.4)
MEAN CORPUSCULAR VOLUME: 109 fL — ABNORMAL HIGH (ref 77.6–95.7)
MEAN PLATELET VOLUME: 8.3 fL (ref 6.8–10.7)
MONOCYTES ABSOLUTE COUNT: 0.1 10*9/L — ABNORMAL LOW (ref 0.3–0.8)
MONOCYTES RELATIVE PERCENT: 4.8 %
NEUTROPHILS ABSOLUTE COUNT: 0.8 10*9/L — ABNORMAL LOW (ref 1.8–7.8)
NEUTROPHILS RELATIVE PERCENT: 31 %
PLATELET COUNT: 219 10*9/L (ref 150–450)
RED BLOOD CELL COUNT: 2.24 10*12/L — ABNORMAL LOW (ref 3.95–5.13)
RED CELL DISTRIBUTION WIDTH: 16.1 % — ABNORMAL HIGH (ref 12.2–15.2)
WBC ADJUSTED: 2.7 10*9/L — ABNORMAL LOW (ref 3.6–11.2)

## 2022-05-30 LAB — RENAL FUNCTION PANEL
ALBUMIN: 3.5 g/dL (ref 3.4–5.0)
ANION GAP: 3 mmol/L — ABNORMAL LOW (ref 5–14)
BLOOD UREA NITROGEN: 24 mg/dL — ABNORMAL HIGH (ref 9–23)
BUN / CREAT RATIO: 12
CALCIUM: 9.2 mg/dL (ref 8.7–10.4)
CHLORIDE: 112 mmol/L — ABNORMAL HIGH (ref 98–107)
CO2: 24.7 mmol/L (ref 20.0–31.0)
CREATININE: 1.95 mg/dL — ABNORMAL HIGH
EGFR CKD-EPI (2021) FEMALE: 34 mL/min/{1.73_m2} — ABNORMAL LOW (ref >=60)
GLUCOSE RANDOM: 88 mg/dL (ref 70–99)
PHOSPHORUS: 3.9 mg/dL (ref 2.4–5.1)
POTASSIUM: 5.2 mmol/L — ABNORMAL HIGH (ref 3.4–4.8)
SODIUM: 140 mmol/L (ref 135–145)

## 2022-05-30 LAB — EBV QUANTITATIVE PCR, BLOOD: EBV VIRAL LOAD RESULT: NOT DETECTED

## 2022-05-30 LAB — CMV DNA, QUANTITATIVE, PCR: CMV VIRAL LD: NOT DETECTED

## 2022-05-30 LAB — PROTEIN / CREATININE RATIO, URINE
CREATININE, URINE: 121.3 mg/dL
PROTEIN URINE: 11.7 mg/dL
PROTEIN/CREAT RATIO, URINE: 0.096

## 2022-05-30 LAB — AMYLASE: AMYLASE: 145 U/L — ABNORMAL HIGH (ref 30–118)

## 2022-05-30 LAB — LIPASE: LIPASE: 23 U/L (ref 12–53)

## 2022-05-30 LAB — BK VIRUS QUANTITATIVE PCR, BLOOD: BK BLOOD RESULT: NOT DETECTED

## 2022-05-30 LAB — HEMOGLOBIN A1C: HEMOGLOBIN A1C: <3.8 % — ABNORMAL LOW (ref 4.8–5.6)

## 2022-05-30 LAB — MAGNESIUM: MAGNESIUM: 1.6 mg/dL (ref 1.6–2.6)

## 2022-05-30 LAB — C-PEPTIDE: C-PEPTIDE: 2.01 ng/mL (ref 0.48–5.05)

## 2022-05-30 MED ORDER — DICYCLOMINE 10 MG CAPSULE
ORAL_CAPSULE | Freq: Four times a day (QID) | ORAL | 11 refills | 30 days | Status: CP
Start: 2022-05-30 — End: 2023-05-30

## 2022-05-30 MED ORDER — SIMETHICONE 180 MG CAPSULE
ORAL_CAPSULE | Freq: Four times a day (QID) | ORAL | 11 refills | 30 days | Status: CP | PRN
Start: 2022-05-30 — End: ?

## 2022-05-30 NOTE — Unmapped (Unsigned)
Met w/ patient in [facility] today. Reviewed meds/symptoms. Any new medications?                 Fever/cold/flu symptoms denies  BP: 138/82 today/ Home BP reported not checking  BG: not checking- WNL on labs  Headache/Dizziness/Lightheaded: denies  Hand tremors: denies  Numbness/tingling: denies  Fevers/chills/sweats: denies  CP/SOB/palpatations: denies  Nausea/vomiting/heartburn: reports nausea, no vomiting  Diarrhea/constipation: diarrhea 3-4 times per week- decreased PO intake  UTI symptoms (burn/pain/itch/frequency/urgency/odor/color/foam): denies  No visible or palpable edema     Appetite good; reports adequate hydration. 1 bottles/fluid per day.     Pt reports being well rested and getting adequate exercise.     Continues to follow Covid/health safety precautions by taking care to mask, perform frequent hand hygeine and minimal public activity. Offered support and guidance for this process given their immune-suppressed state. We discussed reduced covid vaccine coverage for transplant patients and importance of continuing to mask and practice safe distancing. Commented that booster vaccines will likely be advised as an ongoing process.     Pain: reports abd pain intermittent     Last Envarsus taken 0900 yesterday; held for this morning's labs. Current dose 7 mg bid/daily; Imuran 100 mg daily     Other complaints or concerns: reports anxiety since trp    Referrals needed: trp psych     Pt Follow up: labs, possible kidney biopsy     Immunization status: none today     Functional Score: 100     Employment/work status:  works full time at [employer] - start bentyl, simethicone  Consult trp psych  Labs in 2 weeks

## 2022-05-31 LAB — TACROLIMUS LEVEL, TROUGH: TACROLIMUS, TROUGH: 5.4 ng/mL (ref 5.0–15.0)

## 2022-06-06 DIAGNOSIS — Z940 Kidney transplant status: Principal | ICD-10-CM

## 2022-06-06 DIAGNOSIS — R197 Diarrhea, unspecified: Principal | ICD-10-CM

## 2022-06-06 MED ORDER — TACROLIMUS XR 4 MG TABLET,EXTENDED RELEASE 24 HR
ORAL_TABLET | Freq: Every day | ORAL | 11 refills | 30 days | Status: CP
Start: 2022-06-06 — End: 2023-06-06

## 2022-06-06 MED ORDER — TACROLIMUS XR 1 MG TABLET,EXTENDED RELEASE 24 HR
ORAL_TABLET | Freq: Every day | ORAL | 11 refills | 30 days | Status: CP
Start: 2022-06-06 — End: 2023-06-06
  Filled 2022-06-10: qty 60, 30d supply, fill #0

## 2022-06-06 MED ORDER — RIFAXIMIN 550 MG TABLET
ORAL_TABLET | Freq: Three times a day (TID) | ORAL | 0 refills | 14.00000 days | Status: CP
Start: 2022-06-06 — End: 2022-06-20
  Filled 2022-06-10: qty 42, 14d supply, fill #0

## 2022-06-06 NOTE — Unmapped (Signed)
Pt called reporting little relief from new medications bentyl and simethicone.  Pt advised it has only been a week and she should continue to take to see if they help.  Reviewed with Dr. Carlene Coria, pt advised to start rifaximin for bowel bacterial overgrowth.  Script sent to [facility] to see if it is covered by insurance.  Pt encouraged to call the [facility] to schedule an appt as referral has been processed.  Pt agreed to get labs next week and follow up with Dr. Carlene Coria in June - message sent to be scheduled.

## 2022-06-06 NOTE — Unmapped (Signed)
[facility] Refill Coordination Note    Specialty Medication(s) to be Shipped:   Transplant: Envarsus 1mg , Envarsus 4mg , and Azathioprine 50mg     Other medication(s) to be shipped: No additional medications requested for fill at this time     Elaine Koch, DOB: 1985/06/08  Phone: [phone] (home)       All above HIPAA information was verified with patient.     Was a Nurse, learning disability used for this call? No    Completed refill call assessment today to schedule patient's medication shipment from the [facility] [phone]).  All relevant notes have been reviewed.     Specialty medication(s) and dose(s) confirmed: Regimen is correct and unchanged.   Changes to medications: Elaine Koch reports no changes at this time.  Changes to insurance: No  New side effects reported not previously addressed with a pharmacist or physician: None reported  Questions for the pharmacist: No    Confirmed patient received a Conservation officer, historic buildings and a Surveyor, mining with first shipment. The patient will receive a drug information handout for each medication shipped and additional FDA Medication Guides as required.       DISEASE/MEDICATION-SPECIFIC INFORMATION        N/A    SPECIALTY MEDICATION ADHERENCE     Medication Adherence    Patient reported X missed doses in the last month: 0  Specialty Medication: azathioprine 50 mg tablet (IMURAN)  Patient is on additional specialty medications: Yes  Additional Specialty Medications: ENVARSUS XR 1 mg Tb24 extended release tablet (tacrolimus)  Patient Reported Additional Medication X Missed Doses in the Last Month: 0  Patient is on more than two specialty medications: Yes  Specialty Medication: ENVARSUS XR 4 mg Tb24 extended release tablet (tacrolimus)  Patient Reported Additional Medication X Missed Doses in the Last Month: 0              Were doses missed due to medication being on hold? No    Envarsus Xr 1 mg: 4 days of medicine on hand   Envarsus Xr 4 mg: 4 days of medicine on hand   Azathioprine 50 mg: 4 days of medicine on hand     REFERRAL TO PHARMACIST     Referral to the pharmacist: Not needed      SHIPPING     Shipping address confirmed in Epic.     Patient was notified of new phone menu : Yes    Delivery Scheduled: Yes, Expected medication delivery date: 06/11/22.  However, Rx request for refills was sent to the provider as there are none remaining.     Medication will be delivered via UPS to the prescription address in Epic WAM.    Elaine Koch   [facility] Specialty Technician

## 2022-06-06 NOTE — Unmapped (Addendum)
4/25: reaching out to clinic to get ok for onboarding. Waiting for clinic direction before contacting patient -ef      [facility] Specialty Medication Onboarding    Specialty Medication: XIFAXAN 550 mg Tab (rifAXIMin)  Prior Authorization: Approved   Financial Assistance: No - copay  <$25  Final Copay/Day Supply: $4.60 / 14    Insurance Restrictions: None     Notes to Pharmacist:     The triage team has completed the benefits investigation and has determined that the patient is able to fill this medication at [facility]. Please contact the patient to complete the onboarding or follow up with the prescribing physician as needed.

## 2022-06-07 NOTE — Unmapped (Signed)
[facility]   Specialty Lite Counseling    Elaine Koch is a 37 y.o. female with SIBO who I am counseling today on initiation of therapy.  I am speaking to the patient.    Was a Nurse, learning disability used for this call? No    Verified patient's date of birth / HIPAA.    Specialty Lite medication(s) to be sent: Infectious Disease: Xifaxan      Non-specialty medications/supplies to be sent: n/a      Medications not needed at this time: n/a         An offer to provide counseling to the patient regarding their medication was made. The patient accepted counseling. The patient was counseled on the following only: medication administration, missed dose instructions, goals of therapy, side effects and monitoring parameters, warnings and precautions, drug/food interactions, and storage, handling precautions, and disposal      Current Medications (including OTC/herbals), Comorbidities and Allergies     Current Outpatient Medications   Medication Sig Dispense Refill    aspirin (ECOTRIN) 81 MG tablet Take 1 tablet (81 mg total) by mouth daily. 30 tablet 11    azathioprine (IMURAN) 50 mg tablet Take 2 tablets (100 mg total) by mouth daily 60 tablet 11    dicyclomine (BENTYL) 10 mg capsule Take 1 capsule (10 mg total) by mouth Four (4) times a day (before meals and nightly). 120 capsule 11    metroNIDAZOLE (FLAGYL) 500 MG tablet Take 1 tablet (500 mg total) by mouth two (2) times a day. 10 tablet 0    rifAXIMin (XIFAXAN) 550 mg Tab Take 1 tablet (550 mg total) by mouth Three (3) times a day for 14 days. 42 tablet 0    simethicone (GAS-X) 180 mg capsule Take 1 capsule (180 mg total) by mouth every six (6) hours as needed for flatulence. 120 capsule 11    sodium bicarbonate 650 mg tablet Take 2 tablets (1,300 mg total) by mouth Three (3) times a day. 180 tablet 3    tacrolimus (ENVARSUS XR) 1 mg Tb24 extended release tablet Take 2 tablets (2 mg total) by mouth in the morning. Take FOUR 4 mg tablets (16 mg total) and TWO 1 mg tablets (2 mg total) for a total dose of 18 mg daily. 60 tablet 11    tacrolimus (ENVARSUS XR) 4 mg Tb24 extended release tablet Take 4 tablets (16 mg total) by mouth daily. Take FOUR 4 mg tablets (16 mg total) and TWO 1 mg tablets (2 mg total) for a total dose of 18 mg daily 120 tablet 11     No current facility-administered medications for this visit.       Allergies   Allergen Reactions    Iodinated Contrast Media Other (See Comments), Rash and Swelling     Burning, warmth throughout body and tingling.  Throat swelling.  Treated with benadryl and symptoms improved.  Has not had contrast since then (2013 or 2014).    Burning    Nickel Rash    Propranolol Other (See Comments) and Swelling    Eye Irrigating Solution [Ophthalmic Irrigation Solution] Other (See Comments)     Contrast dye (name?) used in eyes caused hot feeling in face, reversed with Benadryl.     Iodine      Other reaction(s): Skin Rash    Naltrexone Rash    Uni-Cortrom Rash       Patient Active Problem List   Diagnosis    Anemia    Diabetic  nephropathy (CMS-HCC)    Diabetic retinopathy (CMS-HCC)    Type 1 diabetes mellitus (CMS-HCC)    Sickle cell trait (CMS-HCC)    Other secondary hypertension    History of blood transfusion    Cardiomyopathy (CMS-HCC)    Kidney replaced by transplant    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    AKI (acute kidney injury) (CMS-HCC)    COVID-19 in immunocompromised patient (CMS-HCC)    Previous cesarean delivery affecting pregnancy    Supervision of other high risk pregnancies, first trimester    Other neutropenia (CMS-HCC)    Diarrhea of presumed infectious origin    Abdominal pain    History of simultaneous kidney and pancreas transplant (CMS-HCC)    BV (bacterial vaginosis)    Chronic diarrhea of unknown origin       Reviewed and up to date in Epic.    Appropriateness of Therapy     Prescription has been clinically reviewed: Yes    Financial Information     Medication Assistance provided: Prior Authorization    Anticipated copay of $4.60 reviewed with patient.     Patient Specific Needs     Does the patient have any physical, cognitive, or cultural barriers? No    Does the patient have adequate living arrangements? (i.e. the ability to store and take their medication appropriately) Yes    Did you identify any home environmental safety or security hazards? No    Patient prefers to have medications discussed with  Patient     Is the patient or caregiver able to read and understand education materials at a high school level or above? Yes    Patient's primary language is  English     Is the patient high risk? No    SOCIAL DETERMINANTS OF HEALTH     At the [facility], we have learned that life circumstances - like trouble affording food, housing, utilities, or transportation can affect the health of many of our patients.   That is why we wanted to ask: are you currently experiencing any life circumstances that are negatively impacting your health and/or quality of life? Patient declined to answer    Social Determinants of Health     Financial Resource Strain: Medium Risk (03/30/2018)    Overall Financial Resource Strain (CARDIA)     Difficulty of Paying Living Expenses: Somewhat hard   Internet Connectivity: Not on file   Food Insecurity: Not on file   Tobacco Use: Low Risk  (05/30/2022)    Patient History     Smoking Tobacco Use: Never     Smokeless Tobacco Use: Never     Passive Exposure: Not on file   Housing/Utilities: Not on file   Alcohol Use: Not on file   Transportation Needs: Not on file   Substance Use: Not on file   Health Literacy: Not on file   Physical Activity: Insufficiently Active (04/20/2019)    Exercise Vital Sign     Days of Exercise per Week: 2 days     Minutes of Exercise per Session: 30 min   Interpersonal Safety: Not on file   Stress: No Stress Concern Present (04/20/2019)    [employer] of Occupational Health - Occupational Stress Questionnaire     Feeling of Stress : Only a little   Intimate Partner Violence: Unknown (04/20/2019)    Humiliation, Afraid, Rape, and Kick questionnaire     Fear of Current or Ex-Partner: Patient declined     Emotionally Abused: Patient  declined     Physically Abused: Patient declined     Sexually Abused: Patient declined   Depression: Not at risk (01/10/2022)    Received from [facility]    PHQ-2     Patient Health Questionnaire-2 Score: 0   Social Connections: Unknown (04/20/2019)    Social Connection and Isolation Panel [NHANES]     Frequency of Communication with Friends and Family: Patient declined     Frequency of Social Gatherings with Friends and Family: Patient declined     Attends Religious Services: Patient declined     Database administrator or Organizations: Patient declined     Attends Banker Meetings: Patient declined     Marital Status: Patient declined       Would you be willing to receive help with any of the needs that you have identified today? Not applicable    Delivery Information     Verified delivery address.    Scheduled delivery date: 06/11/22    Expected start date: 06/11/22    Medication will be delivered via UPS to the prescription address in [facility].  This shipment will not require a signature.      Explained the services we provide at [facility] and that each month we will send text messages and/or mychart messages to set up refills. Informed patient that refills should be scheduled 7-10 days prior to when they will run out of medication. Informed patient that a welcome packet, containing information about our pharmacy and other support services, a Notice of Privacy Practices, and a drug information handout will be sent.      The patient or caregiver noted above participated in the development of this care plan and knows that they can request review of or adjustments to the care plan at any time.      Patient or caregiver verbalized understanding of the above information as well as how to contact the pharmacy at [phone] option 4 with any questions/concerns.  The pharmacy is open Monday through Friday 8:30am-4:30pm.  A pharmacist is available 24/7 via pager to answer any clinical questions they may have.      Roderic Palau, PharmD  [facility] Specialty Pharmacist

## 2022-06-10 LAB — HLA DS POST TRANSPLANT
ANTI-DONOR DRW #2 MFI: 1103 MFI — ABNORMAL HIGH
ANTI-DONOR HLA-A #1 MFI: 114 MFI
ANTI-DONOR HLA-A #2 MFI: 0 MFI
ANTI-DONOR HLA-B #1 MFI: 59 MFI
ANTI-DONOR HLA-B #2 MFI: 68 MFI
ANTI-DONOR HLA-C #2 MFI: 1551 MFI — ABNORMAL HIGH
ANTI-DONOR HLA-DQB #1 MFI: 33 MFI
ANTI-DONOR HLA-DQB #2 MFI: 320 MFI
ANTI-DONOR HLA-DR #1 MFI: 177 MFI
ANTI-DONOR HLA-DR #2 MFI: 236 MFI

## 2022-06-10 LAB — FSAB CLASS 1 ANTIBODY SPECIFICITY: HLA CLASS 1 ANTIBODY RESULT: POSITIVE

## 2022-06-10 LAB — FSAB CLASS 2 ANTIBODY SPECIFICITY: HLA CL2 AB RESULT: POSITIVE

## 2022-06-10 MED FILL — ENVARSUS XR 4 MG TABLET,EXTENDED RELEASE: ORAL | 30 days supply | Qty: 120 | Fill #0

## 2022-06-10 MED FILL — AZATHIOPRINE 50 MG TABLET: ORAL | 30 days supply | Qty: 60 | Fill #5

## 2022-06-10 NOTE — Unmapped (Signed)
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 I was worried she may need a renal biopsy, however creatinine today is down somewhat to 1.95. Will hold off on biopsy for now, but have a low threshold if creatinine should rise.   Her amylase has been chronically mildly elevated, but has been stable. Lipase normal. C-peptide 2.01 and Hgb A1c < 3.8.  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    We discussed at length that it is hard to get a handle on all that is going on when her care is so disjointed. She has been to the ED 5 times since August, but has not had an in person clinic visit during that time. Work has been a barrier to getting into the clinic, but now if she is able to get an afternoon appointment here, she can leave work at lunch. She will also be able to get labs there with an accurate trough.     # Immunosuppression  Tacrolimus: Her tacrolimus level of 5.4 is not an accurate trough. Her goal is 8-10 given pancreas transplant and that she is on azathioprine instead of MPA. Will continue current dose for now, have asked her to get an accurate trough locally.  Azathioprine 100 mg daily. She is not actively trying to prevent pregnancy so we have not converted her back to MPA.  Prednisone free.    # Ongoing GI symptoms  I suspect she has some degree of irritable bowel syndrome. She has a referral to GI, but in the meantime I have prescribed Bentyl and simethicone. She will let us know in a couple of weeks if that is helping. If I doesn't, would consider empiric treatment for small bowel bacterial overgrowth.    # BP management  138/82 in clinic today, not currently on antihypertensives.    # Infectious disease  CMV PCR: neg 05/30/22  BK PCR: neg 05/30/22  EBV PCR: neg 05/30/22    # Anemia screening  Hgb low at 8.5, stable compared with recent labs, but has trended down since transplant.  Iron stores/ferritin reasonable on 05/15/22, B12 and folate normal.   MCV has been persistently elevated, felt anemia may be due to azathioprine.    # Cardiovascular  The ASCVD Risk score (Arnett DK, et al., 2019) failed to calculate.  Lipid panel normal on 10/11/21.    # Bone and mineral metabolism  Ca and phos normal. Vitamin D normal 03/02/21.  PTH 112.5 on 10/11/21.    # Electrolytes  Mg 1.6 off supplementation.  K acceptable, though mildly elevated, at 5.2.    # Immunizations  Last Covid booster 03/23/20.  Flu shot 12/21/21.  Due for Prevnar-20.  Discuss completing HPV vaccine at next visit.    # Cancer screening  PAP smear: Overdue, local GYN won't see her due to transplant status  Mammogram: N/A  Colonoscopy: N/A  Skin: recommend yearly dermatology evaluation  Renal US: 10/11/21  CXR: 02/01/22    # Childbearing age  She is not trying actively to prevent pregnancy. Did discuss that even though she is on an acceptable immunosuppression regimen, that it would be better to  wait for pregnancy until her creatinine is stable and we have a better handle on her GI symptoms.    Most pre-menopausal women experience return of fertility post-transplant, and a safe pregnancy is possible with careful planning. Recommend using abstinence or a highly effective contraception to avoid pregnancy until planning pregnancy, and if planning pregnancy please notify nephrology so that we can adjust medications to avoid teratogens.    # Follow up:  Labs: Monthly  Return visit: 2-3 months    __________________________________________________________________      Kidney Transplant History:   Date of Transplant: 05/03/2019 (Kidney / Pancreas)  Type of Transplant: Deceased  KDPI: 34%  Native Kidney Disease: Type 1 Diabetes  Prior Transplants: None    Biopsies:   Zero-Hour Biopsy: Slim biopsy core without characteristic and diagnostic abnormalities.  03/02/2021: Minor morphologic changes.    Donor Specific Antibodies:   To C17, first on 11/26/19 at 1226, peak 3268 on 10/11/21, 1551 on 05/30/22.  To DR51, first on 06/07/20 at 1330, 1103 on 05/30/22.    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 Had some low pressures as an outpatient, blood pressure meds held and was on midodrine for a time. Transitioned to Envarsus given high tacrolimus requirement.  - Admitted 6/1-07/16/19 with HA/N/V/D. Had AKI with tacrolimus level of 27.9. Had IVF, found to be C.diff positive and started on vancomycin.   - On 07/20/19 noted to have wbc < 0.6, Myfortic and Valcyte held, given Granix. Up to 3.9 on 07/26/19, Myfortic restarted at 180 mg BID (was 720 mg prior to holding).  - Diarrhea returned on 07/28/19, treated again with vanc for 14 days.  - Continued to have diarrhea, referred for FMT. Had been treated with fidaxomicin and bezlotoxumab.  - Admitted 8/20-8/22/21 for Enterococcal UTI.  - Had fecal transplant 02/14/20. Called 02/17/20 feeling poorly, admitted 1/6-1/11/22 with AKI and elevated tac level. Had proteus in urine and blood, was Covid positive as well. Treated with cefipime then then cipro, also get remdesivir. Myfortic and Valcyte held on discharge for leukopenia. Myfortic restarted 03/03/20.  - She unfortunately had recurrence of her diarrhea which did not respond to further oral therapy, had repeat FMT on 06/19/20.  - At her visit 09/01/20 she was interested in pursuing pregnancy but knew that she could not be on Myfortic. Planned to have her see MFM for a consult prior to transitioning from Myfortic to azathioprine. She called 10/30/20 to report a positive pregnancy test. We stopped Myfortic and started azathioprine. She was seen in the ED 11/02/20 for vaginal bleeding, was seen by OB and felt to most likely be experiencing a miscarriage. Beta HCG was 14,771. On 11/08/20 was having ongoing bleeding and clots. She presented again to [facility] 11/17/20 with lower abdominal pain and bleeding. US showed no clear IUP and bHCG was down to 7672. Underwent D&C and subsequent diagnostic laparoscopy to rule out ectopic pregnancy, fallopian tubes were normal. Hgb dropped from 11 to 9. Creatinine remained at baseline. She was to get follow up bHCG following D&C but they were unable to reach her to get that done.  Micah Flesher to ED at [facility] 01/29/21 where she was found to have a bHCG of 190,000. US showed pregnancy at [redacted]w[redacted]d. She was able to tolerate  po so was discharged home, creatinine was 1.53. She was seen by MFM 02/06/21. She did not have any labs between 01/29/21 and 02/23/21, creatinine from 1.53 to 1.73. Repeated labs on 02/28/21 and creatinine remained mildly elevated from baseline at 1.65. Since it was still early in pregnancy, we decided to biopsy her to rule out rejection before she was too progressed in her pregnancy. She underwent biopsy 03/02/21 that showed only minor morphological changes, no rejection.  -  She saw MFM and Dr. Thad Ranger 03/06/21 with plans to start epogen for low Hgb. Wanted to continue the pregnancy at that time, however called 03/12/21 asking to proceed with termination, due to concerns for affects of pregnancy on her kidney as well as having delivered previous pregnancy at 27 weeks. Underwent uterine vacuum aspiration on 03/20/21.   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 Was not trying to prevent pregnancy, was open to conceiving.    History of Present Illness:     Since the last visit 10/11/21:  - Seen locally 11/22/21 for URI symptoms, Covid and strep negative. Not tested for RSV or flu that I can see. Recommended symptomatic treatment. Seen again 01/07/22 and 01/10/22 for ongoing cough, felt from GERD so protonix started.  - Noted to have wbc 1.9 on 01/09/22, planned for Granix at her appointment 01/11/22, however she cancelled that appointment stating that she was too sick to come to clinic. She ultimately had a dose on 01/24/22.  - Was seen in the ED 02/01/22 with 3 days of N/V. Given IVF and treated for possible UTI with cefdinir. Urine culture grew 50-100K CNS, GC and chlamydia negative, pregnancy negative.  - She did not connect for video visits 03/21/22 or 04/03/22.  - Presented to [facility] ED 04/06/22 with N/V. Treated for presumed pneumonia with amoxicillin, given Zofran. Symptoms improved but she experienced diarrhea and presented to ED 04/24/22 for concerns of C.diff. Renal US and CT abdomen unremarkable. She did not have a BM while in the ED for ~ 12 hours so discharged with a stool kit to collect a specimen. Seen at [facility] ED 05/03/22 for ongoing diarrhea, had not submitted stool sample. There a rapid C.diff was negative, given IVF and discharged on Bentyl and Imodium.   - Called on call coordinator 05/04/22 complaining of ongoing abdominal cramping and diarrhea, somewhat frustrated that no one could explain her symptoms. Placed e-consult for GI, when they reached out for a history, they were unable to reach her.  - Her coordinator reached out 05/06/22 and patient stated diarrhea was better and she was not needing Imodium. Was offered an appointment with me, patient declined and preferred to see GI locally. Called 05/14/22 stating she was having extreme stomach cramps. Again declined appointment with me, asked to be admitted. Was advised to come to ED.  - Admitted from 4/2-05/16/22. Noted that on 05/03/22 at [facility], her stool sample was positive for enteropathogenic E.coli. Was started on IV Cipro empirically, but repeat GIPP returned negative. She did not have further diarrhea while in house. GI referral made. Was also treated with Flagyl for BV. Noted to have new eosinophilia, ddx includes eosinophilic colitis from azathioprine.    In clinic today:  - She continues to have GI symptoms. She feels her symptoms were good for a while after her second fecal transplant, but around the end of February/early March she was put on antibiotics for pneumonia at [facility] and has had GI  symptoms ever since. Notes a lot of abdominal bloating. Would have 8-10 loose bowel movements at night about 1-2 times a week, though hasn't had that since her hospitalization earlier this month. Has pretty extreme abdominal cramping and burning, last 1-1.5 hours, does not change with eating or having a bowel movement. No fever.  - She reports that prior to kidney transplant, she had some irritable bowel symptoms, diarrhea, and sensitivity to certain foods. She had bad GERD while pregnant. These symptoms seem different to her.  - Symptoms do limit her fluid intake, in that she feels nauseated when she drinks a lot. Somewhat better with ginger ale.    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 Has a son born in 22.    Review of Systems:   All other systems negative, except as documented in the HPI.    Physical Exam:    BP 138/82 (BP Site: L Arm, BP Position: Sitting, BP Cuff Size: Medium)  - Pulse 75  - Temp 36.1 ??C (97 ??F) (Temporal)  - Ht 165.1 cm (5' 5)  - Wt 72.9 kg (160 lb 12.8 oz)  - LMP 05/06/2022 (Approximate)  - BMI 26.76 kg/m??   General: Patient is a pleasant female in no apparent distress.  Eyes: Sclera anicteric.  ENT: Oropharynx without lesions.   Neck: Supple without LAD/JVD/bruits.  Lungs: Clear to auscultation bilaterally, no wheezes/rales/rhonchi.  Cardiovascular: Regular rate and rhythm without murmurs, rubs or gallops.  Abdomen: Soft, notender/nondistended. Positive bowel sounds. No tenderness over the graft.  Extremities: Without edema, joints without evidence of synovitis.  Skin: Without rash.  Neurological: Grossly nonfocal.  Psychiatric:  Somewhat flat affect .      Allergies:   Allergies   Allergen Reactions    Iodinated Contrast Media Other (See Comments), Rash and Swelling     Burning, warmth throughout body and tingling.  Throat swelling.  Treated with benadryl and symptoms improved.  Has not had contrast since then (2013 or 2014).    Burning    Nickel Rash    Propranolol Other (See Comments) and Swelling    Eye Irrigating Solution [Ophthalmic Irrigation Solution] Other (See Comments)     Contrast dye (name?) used in eyes caused hot feeling in face, reversed with Benadryl.     Iodine      Other reaction(s): Skin Rash    Naltrexone Rash    Uni-Cortrom Rash        Current Medications:   Current Outpatient Medications   Medication Sig Dispense Refill    aspirin (ECOTRIN) 81 MG tablet Take 1 tablet (81 mg total) by mouth daily. 30 tablet 11    azathioprine (IMURAN) 50 mg tablet Take 2 tablets (100 mg total) by mouth daily 60 tablet 11    dicyclomine (BENTYL) 10 mg capsule Take 1 capsule (10 mg total) by mouth Four (4) times a day (before meals and nightly). 120 capsule 11    metroNIDAZOLE (FLAGYL) 500 MG tablet Take 1 tablet (500 mg total) by mouth two (2) times a day. 10 tablet 0    rifAXIMin (XIFAXAN) 550 mg Tab Take 1 tablet (550 mg total) by mouth Three (3) times a day for 14 days. 42 tablet 0    simethicone (GAS-X) 180 mg capsule Take 1 capsule (180 mg total) by mouth  every six (6) hours as needed for flatulence. 120 capsule 11    sodium bicarbonate 650 mg tablet Take 2 tablets (1,300 mg total) by mouth Three (3) times a day. 180 tablet 3    tacrolimus (ENVARSUS XR) 1 mg Tb24 extended release tablet Take 2 tablets (2 mg total) by mouth in the morning. Take FOUR 4 mg tablets (16 mg total) and TWO 1 mg tablets (2 mg total) for a total dose of 18 mg daily. 60 tablet 11    tacrolimus (ENVARSUS XR) 4 mg Tb24 extended release tablet Take 4 tablets (16 mg total) by mouth daily. Take FOUR 4 mg tablets (16 mg total) and TWO 1 mg tablets (2 mg total) for a total dose of 18 mg daily 120 tablet 11     No current facility-administered medications for this visit.         Laboratory Studies:  Results for orders placed or performed in visit on 05/30/22   Urine Culture    Specimen: Clean Catch; Urine   Result Value Ref Range    Urine Culture, Comprehensive NO GROWTH    EBV Viral Load  (Epstein-Barr Virus DNA)   Result Value Ref Range    EBV Viral Load Result Not Detected Not Detected   HLA  FSAB CL2 SPECIFICITY   Result Value Ref Range    HLA Class 2 Antibody Result Positive     HLA Class 2 Antibodies Identified [id number]     HLA Class 2 Antibody Comment     HLA  FSAB CL1 SPECIFICITY   Result Value Ref Range    HLA Class 1 Antibody Result Positive     HLA Class 1 Antibodies Identified Cw:4, 6, 17, 18     HLA Class 1 Antibody Comment     HLA DSA Post Transplant   Result Value Ref Range    Donor ID [id number]     Donor HLA-A Antigen #1 A30     Anti-Donor HLA-A #1 MFI 114 <1000 MFI    Donor HLA-A Antigen #2 A68     Anti-Donor HLA-A #2 MFI 0 <1000 MFI    Donor HLA-B Antigen #1 B72     Anti-Donor HLA-B #1 MFI 59 <1000 MFI    Donor HLA-B Antigen #2 B42     Anti-Donor HLA-B #2 MFI 68 <1000 MFI    Donor HLA-C Antigen #1 C2     Donor HLA-C Antigen #2 C17     Anti-Donor HLA-C #2 MFI 1551 (H) <1000 MFI    Donor HLA-DR Antigen #1 DR1     Anti-Donor HLA-DR #1 MFI 177 <1000 MFI    Donor HLA-DR Antigen #2 DR15     Anti-Donor HLA-DR #2 MFI 236 <1000 MFI    Donor DRw Antigen #2 DR51     Anti-Donor DRw #2 MFI 1103 (H) <1000 MFI    Donor HLA-DQB Antigen #1 DQ5     Anti-Donor HLA-DQB #1 MFI 33 <1000 MFI    Donor HLA-DQB Antigen #2 DQ6     Anti-Donor HLA-DQB #2 MFI 320 <1000 MFI    Donor HLA-DP Antigen #1 [id number]:01     Anti-Donor HLA-DP Ag #1 MFI 269 <1000 MFI    Donor HLA-DP Antigen #2 [id number]     DSA Comment     Hemoglobin A1c   Result Value Ref Range    Hemoglobin A1C <3.8 (L) 4.8 - 5.6 %   C-peptide   Result Value Ref Range    C-Peptide 2.01 0.48 - 5.05  ng/mL   Lipase Level   Result Value Ref Range    Lipase 23 12 - 53 U/L   Amylase Level   Result Value Ref Range    Amylase 145 (H) 30 - 118 U/L   BK Virus, DNA, Quantitative, Serum   Result Value Ref Range    BK Blood Result Not Detected Not Detected   CMV DNA, quantitative, PCR   Result Value Ref Range    CMV Viral Ld Not Detected Not Detected   Tacrolimus Level, Trough   Result Value Ref Range    Tacrolimus, Trough 5.4 5.0 - 15.0 ng/mL   Renal Function Panel   Result Value Ref Range    Sodium 140 135 - 145 mmol/L    Potassium 5.2 (H) 3.4 - 4.8 mmol/L    Chloride 112 (H) 98 - 107 mmol/L    CO2 24.7 20.0 - 31.0 mmol/L    Anion Gap 3 (L) 5 - 14 mmol/L    BUN 24 (H) 9 - 23 mg/dL    Creatinine 1.61 (H) 0.55 - 1.02 mg/dL    BUN/Creatinine Ratio 12     eGFR CKD-EPI (2021) Female 34 (L) >=60 mL/min/1.46m2    Glucose 88 70 - 99 mg/dL    Calcium 9.2 8.7 - 09.6 mg/dL    Phosphorus 3.9 2.4 - 5.1 mg/dL    Albumin 3.5 3.4 - 5.0 g/dL   Magnesium Level   Result Value Ref Range    Magnesium 1.6 1.6 - 2.6 mg/dL   Protein/Creatinine Ratio, Urine   Result Value Ref Range    Creat U 121.3 Undefined mg/dL    Protein, Ur 04.5 Undefined mg/dL    Protein/Creatinine Ratio, Urine 0.096 Undefined   Urinalysis with Microscopy   Result Value Ref Range    Color, UA Light Yellow     Clarity, UA Clear     Specific Gravity, UA 1.012 1.003 - 1.030    pH, UA 5.5 5.0 - 9.0    Leukocyte Esterase, UA Negative Negative    Nitrite, UA Negative Negative    Protein, UA Negative Negative    Glucose, UA Negative Negative    Ketones, UA Negative Negative    Urobilinogen, UA <2.0 mg/dL <4.0 mg/dL    Bilirubin, UA Negative Negative    Blood, UA Negative Negative    RBC, UA <1 <=4 /HPF    WBC, UA <1 0 - 5 /HPF    Squam Epithel, UA 2 0 - 5 /HPF    Bacteria, UA Rare (A) None Seen /HPF    Hyaline Casts, UA 1 0 - 1 /LPF   CBC w/ Differential   Result Value Ref Range    WBC 2.7 (L) 3.6 - 11.2 10*9/L    RBC 2.24 (L) 3.95 - 5.13 10*12/L    HGB 8.5 (L) 11.3 - 14.9 g/dL    HCT 98.1 (L) 19.1 - 44.0 %    MCV 109.0 (H) 77.6 - 95.7 fL    MCH 38.0 (H) 25.9 - 32.4 pg    MCHC 34.9 32.0 - 36.0 g/dL    RDW 47.8 (H) 29.5 - 15.2 %    MPV 8.3 6.8 - 10.7 fL    Platelet 219 150 - 450 10*9/L    Neutrophils % 31.0 %    Lymphocytes % 20.1 %    Monocytes % 4.8 %    Eosinophils % 43.1 %    Basophils % 1.0 %    Absolute Neutrophils 0.8 (L) 1.8 -  7.8 10*9/L    Absolute Lymphocytes 0.5 (L) 1.1 - 3.6 10*9/L    Absolute Monocytes 0.1 (L) 0.3 - 0.8 10*9/L    Absolute Eosinophils 1.2 (H) 0.0 - 0.5 10*9/L    Absolute Basophils 0.0 0.0 - 0.1 10*9/L    Macrocytosis Slight (A) Not Present     *Note: Due to a large number of results and/or encounters for the requested time period, some results have not been displayed. A complete set of results can be found in Results Review.           Electronically signed by:   Drinda Butts, MD  [facility]      I personally spent 55 minutes face-to-face and non-face-to-face in the care of this patient, which includes all pre, intra and post visit time on the date of service.

## 2022-06-12 DIAGNOSIS — D708 Other neutropenia: Principal | ICD-10-CM

## 2022-06-17 DIAGNOSIS — Z940 Kidney transplant status: Principal | ICD-10-CM

## 2022-06-17 DIAGNOSIS — Z79899 Other long term (current) drug therapy: Principal | ICD-10-CM

## 2022-06-24 DIAGNOSIS — Z940 Kidney transplant status: Principal | ICD-10-CM

## 2022-06-24 DIAGNOSIS — Z79899 Other long term (current) drug therapy: Principal | ICD-10-CM

## 2022-07-02 ENCOUNTER — Ambulatory Visit (HOSPITAL_COMMUNITY)
Admission: EM | Admit: 2022-07-02 | Discharge: 2022-07-02 | Disposition: A | Discharge status: Home / Self Care | Payer: Medicare Other | Attending: Psychiatry | Admitting: Psychiatry

## 2022-07-02 ENCOUNTER — Encounter (HOSPITAL_COMMUNITY): Payer: Self-pay

## 2022-07-02 DIAGNOSIS — Z9483 Pancreas transplant status: Secondary | ICD-10-CM | POA: Diagnosis not present

## 2022-07-02 DIAGNOSIS — F329 Major depressive disorder, single episode, unspecified: Secondary | ICD-10-CM | POA: Diagnosis present

## 2022-07-02 DIAGNOSIS — R5383 Other fatigue: Secondary | ICD-10-CM | POA: Insufficient documentation

## 2022-07-02 DIAGNOSIS — F321 Major depressive disorder, single episode, moderate: Secondary | ICD-10-CM | POA: Diagnosis not present

## 2022-07-02 NOTE — ED Provider Notes (Signed)
Behavioral Health Urgent Care Medical Screening Exam  Patient Name: Christine Reed MRN: [id number] Date of Evaluation: 07/02/22 Chief Complaint:  Seeking outpatient therapy. Diagnosis:  Final diagnoses:  Current moderate episode of major depressive disorder, unspecified whether recurrent (HCC)    History of Present illness: Christine Reed is a 37 y.o. femalepatient presented to [facility] as a walk in unaccompanied seeking outpatient therapy.  Christine Reed, 37 y.o., female patient seen face to face by this provider and chart reviewed on 07/02/22.  Per chart review patient does not believe she has a past psychiatric history.  She has no history of inpatient psychiatric hospitalizations.  She reports 1 suicide attempt at the age of 82 via intentionally taking more insulin than recommended.  She did not seek medical treatment at that time.  Patient has a significant medical history that includes a kidney and pancreas transplant.  She reports compliance with her medications and appointments.  She denies any health concerns at this time.  She works full-time at [employer] in [employer].  She lives at home with her 31-year-old son.  She denies any substance use.  On evaluation Christine Reed reports over the past 2-3 weeks she has noticed an increase in her depression and anxiety.  She does not identify any stressors/triggers other than her long medical history.  She endorses an increase in depression and anxiety with feelings of restlessness, fatigue, decreased appetite, and decreased motivation.  She is finding it difficult to go to work. She does have FMLA in place due to her chronic medical problems. Reports she has called out of work for the past 2 days.  She adamantly denies suicidal ideations.  She denies any plan or intent.  She denies any access to firearms/weapons.  She verbally contracts for safety.  She is seeking outpatient therapy but states her insurance has been a barrier.  She has Medicare  and Medicaid and reports she has been unsuccessful with finding a provider that takes both her insurances.  This Clinical research associate contacted [employer] on the second floor and they have agreed that they would take patient's insurance.  She is instructed to present in the a.m. for a walk-in therapy appointment.  In addition she was provided other outpatient resources for therapy.  Discussed partial hospitalization program with patient.  She is agreeable.  Referral was made to [facility] program  During evaluation [name] is observed sitting in the assessment area in no acute distress.  She is well-groomed and makes good eye contact.  She is alert/oriented x 4, cooperative, and attentive.  She has normal speech and behavior.  She is denying homicidal ideations.  She denies any auditory/visual hallucinations. Objectively there is no evidence of psychosis/mania or delusional thinking.  Patient is able to converse coherently, goal directed thoughts, no distractibility, or pre-occupation. Patient answered question appropriately.    Flowsheet Row ED from 07/02/2022 in [facility] ED from 05/03/2022 in [facility] Emergency Department at [facility] ED from 04/06/2022 in [facility] Emergency Department at [facility]  C-SSRS RISK CATEGORY No Risk No Risk No Risk       Psychiatric Specialty Exam  Presentation  General Appearance:Appropriate for Environment; Casual  Eye Contact:Good  Speech:Clear and Coherent; Normal Rate  Speech Volume:Normal  Handedness:No data recorded  Mood and Affect  Mood:Anxious; Depressed  Affect:Congruent   Thought Process  Thought Processes:Coherent  Descriptions of Associations:Intact  Orientation:No data recorded Thought Content:Logical  Hallucinations:None  Ideas of Reference:None  Suicidal Thoughts:No  Homicidal Thoughts:No   Sensorium  Memory:Immediate Good; Remote Good; Recent  Good  Judgment:Good  Insight:Good   Executive Functions  Concentration:Good  Attention Span:Good  Recall:Good  Fund of Knowledge:Good  Language:Good   Psychomotor Activity  Psychomotor Activity:Normal   Assets  Assets:Communication Skills; Desire for Improvement; Financial Resources/Insurance; Physical Health; Housing; Resilience; Social Support; Vocational/Educational; Transportation   Sleep  Sleep:Good  Number of hours: No data recorded  Physical Exam: Physical Exam Vitals and nursing note reviewed.  Constitutional:      General: She is not in acute distress.    Appearance: Normal appearance. She is not ill-appearing.  HENT:     Head: Normocephalic.  Eyes:     General:        Right eye: No discharge.        Left eye: No discharge.  Cardiovascular:     Rate and Rhythm: Normal rate.  Pulmonary:     Effort: Pulmonary effort is normal.  Musculoskeletal:     Cervical back: Normal range of motion.  Skin:    Coloration: Skin is not jaundiced.  Neurological:     Mental Status: She is alert and oriented to person, place, and time.  Psychiatric:        Attention and Perception: Attention and perception normal.        Mood and Affect: Affect normal. Mood is anxious and depressed.        Speech: Speech normal.        Behavior: Behavior normal. Behavior is cooperative.        Thought Content: Thought content normal.        Cognition and Memory: Cognition normal.        Judgment: Judgment normal.    Review of Systems  Constitutional: Negative.   HENT: Negative.    Eyes: Negative.   Respiratory: Negative.    Cardiovascular: Negative.   Musculoskeletal: Negative.   Skin: Negative.   Neurological: Negative.   Psychiatric/Behavioral:  Positive for depression.    Blood pressure 134/80, pulse 88, temperature 98.2 F (36.8 C), temperature source Oral, resp. rate 18, SpO2 100 %, unknown if currently breastfeeding. There is no height or weight on file to  calculate BMI.  Musculoskeletal: Strength & Muscle Tone: within normal limits Gait & Station: normal Patient leans: N/A   [facility] MSE Discharge Disposition for Follow up and Recommendations: Based on my evaluation the patient does not appear to have an emergency medical condition and can be discharged with resources and follow up care in outpatient services for Medication Management, Partial Hospitalization Program, and Individual Therapy  Discharge patient.  Referral made to [facility] program.  Patient instructed to present to [facility] the second floor in the a.m. for walk-in therapy appointment.  Other outpatient resources for therapy provided.   Ardis Hughs, NP 07/02/2022, 10:17 AM

## 2022-07-02 NOTE — Progress Notes (Signed)
   07/02/22 0753  [facility] Triage Screening (Walk-ins at [facility] only)  How Did You Hear About [place]? Self  What Is the Reason for Your Visit/Call Today? Pt presents to [facility] voluntarily due to worsening depression symptoms for about 2-3 weeks. Pt reports kidney and pancreas transplant march of 2022. Pt reports worsening depression symptoms and is interested in outpatient therapy. Pt reports decreased appetite, lack of energy, restlessness. Pt reports past therapy experience about 4 years ago. Pt reports past suicide attempt at the age of 54 by intentionally taking more insulin then recommended. Pt denies past psychiatric hospitalizations. Pt denies SI/HI and AVH.  How Long Has This Been Causing You Problems? <Week  Have You Recently Had Any Thoughts About Hurting Yourself? No  Are You Planning to Commit Suicide/Harm Yourself At This time? No  Have you Recently Had Thoughts About Hurting Someone Karolee Ohs? No  Are You Planning To Harm Someone At This Time? No  Are you currently experiencing any auditory, visual or other hallucinations? No  Have You Used Any Alcohol or Drugs in the Past 24 Hours? No  Do you have any current medical co-morbidities that require immediate attention? No  Clinician description of patient physical appearance/behavior: calm, cooperative, well groomed  What Do You Feel Would Help You the Most Today? Treatment for Depression or other mood problem  If access to [facility] Urgent Care was not available, would you have sought care in the Emergency Department? No  Determination of Need Routine (7 days)  Options For Referral Outpatient Therapy;Medication Management

## 2022-07-02 NOTE — Discharge Instructions (Addendum)
The suicide prevention education provided includes the following: Suicide risk factors Suicide prevention and interventions National Suicide Hotline telephone number [facility] assessment telephone number [facility] Emergency Assistance [address] and/or Residential Mobile Crisis Unit telephone number    Remove weapons (e.g., guns, rifles, knives), all items previously/currently identified as safety concern.   Remove drugs/medications (over the counter, prescriptions, illicit drugs), all items previously/currently identified as a safety concern.    Base on the information you have provided and the presenting issue, outpatient services with therapy and psychiatry have been recommended.  It is imperative that you follow through with treatment recommendations within 5-7 days from the of discharge to mitigate further risk to your safety and mental well-being. A list of referrals has been provided below to get you started.  You are not limited to the list provided.  In case of an urgent crisis, you may contact the Mobile Crisis Unit with [facility] at [phone].  Outpatient Therapy and Psychiatry for Medicare Recipients  [facility] [address] [place], [place], [address] [phone] phone  [facility] [address] [place], [place], [address] [address] phone ([address] [employer] [address] - [place], [address], [place], [place], [employer], [address], [employer] Healthy [place], [place], [address], [place], [place], [place], Optum, T[employer], [employer], [employer], [place])  [facility] [address] [place], [place], [address] [phone] phone  [facility] [address] [place], [place], [address] [phone] phone  [facility] [address] [place], [place], [address] [phone]  phone [phone] fax  [facility], [place] [address], [place], [address] [phone] phone  [facility] [address] [place], [place], [address] [phone] phone [phone] fax  [facility] [address] [place], [place], [address] [phone] phone  [facility] [address] [place], [place], [address] [phone] phone  [facility] [address] [place], [place], [address] [phone] phone [phone] fax

## 2022-07-03 ENCOUNTER — Telehealth (HOSPITAL_COMMUNITY): Payer: Self-pay | Admitting: Psychiatry

## 2022-07-03 NOTE — Unmapped (Signed)
CONFIDENTIAL PSYCHOLOGICAL TELEPHONE CONTACT    Writer was asked by pt's TNC Coleen Christensen, RN to reach out to [name] given ongoing MH concerns. See MyChart message sent to TNC earlier today (5/22), which notes that pt went to a behavioral health urgent care recently for worsening MH symptoms and was referred for therapy; however, she has been having trouble finding providers who accept Medicare/Medicaid insurance.     Writer called pt and spent about 10 minutes on the phone with her. She reported that she has been increasingly feeling overwhelmed, and has reached out to numerous places to try to receive MH support (e.g. county social services, behavioral health urgent care) but has not found anyone who she can see regularly and accepts her insurance. She was tearful on the phone. Writer asked about current SI, which pt denied after a lengthy pause. We discussed crisis resources including returning to behavioral health urgent care or calling [phone] if in crisis.    Writer discussed sending pt several additional resources for how to find MH providers in her area who accept Medicare and/or Medicaid (will send via MyChart). Writer also discussed setting up an appt to do additional assessment of current MH needs and begin MH treatment as a bridge until she is able to establish with a local community provider. She was amenable to this and agreed to schedule an appt with writer on Friday 07/12/22 at 11am.     Elaine Laguna [name] Kasson Lamere, PhD  Clinical Psychologist

## 2022-07-03 NOTE — Unmapped (Deleted)
CONFIDENTIAL PSYCHOLOGICAL TELEPHONE CONTACT    Writer was asked by pt's TNC Celene Squibb, RN to reach out to Ms. Kerner given ongoing MH concerns. See MyChart message sent to [facility] earlier today (5/22), which notes that pt went to a behavioral health urgent care recently for worsening MH symptoms and was referred for therapy; however, she has been having trouble finding providers who accept Medicare/Medicaid insurance.     Writer called pt and spent about 10 minutes on the phone with her. She reported that she has been increasingly feeling overwhelmed, and has reached out to numerous places to try to receive MH support (e.g. county social services, behavioral health urgent care) but has not found anyone who she can see regularly and accepts her insurance. She was tearful on the phone. Writer asked about current SI, which pt denied after a lengthy pause. We discussed crisis resources including returning to behavioral health urgent care or calling [phone] if in crisis.    Writer discussed sending pt several additional resources for how to find MH providers in her area who accept Medicare and/or Medicaid (will send via MyChart). Writer also discussed setting up an appt to do additional assessment of current MH needs and begin MH treatment as a bridge until she is able to establish with a local community provider. She was amenable to this and agreed to schedule an appt with writer on Friday 07/12/22 at 11am.     Theda Sers, PhD  Clinical Psychologist

## 2022-07-03 NOTE — Telephone Encounter (Signed)
D:  Pt referred per Vernard Gambles, NP for virtual MH-IOP.  A:Oriented pt.  Encouraged pt to verify her benefits with her insurance company.  [facility] scheduled for tomorrow at 9:30 a.m.  R:  Pt receptive.

## 2022-07-04 ENCOUNTER — Other Ambulatory Visit (HOSPITAL_COMMUNITY): Payer: Medicare Other | Attending: Psychiatry | Admitting: Psychiatry

## 2022-07-04 LAB — CBC W/ DIFFERENTIAL
BANDED NEUTROPHILS ABSOLUTE COUNT: 0.1 10*3/uL (ref 0.0–0.1)
BASOPHILS ABSOLUTE COUNT: 0 10*3/uL (ref 0.0–0.2)
BASOPHILS RELATIVE PERCENT: 0 %
EOSINOPHILS ABSOLUTE COUNT: 1.3 10*3/uL — ABNORMAL HIGH (ref 0.0–0.4)
EOSINOPHILS RELATIVE PERCENT: 45 %
HEMATOCRIT: 25.5 % — ABNORMAL LOW (ref 34.0–46.6)
HEMOGLOBIN: 8.4 g/dL — ABNORMAL LOW (ref 11.1–15.9)
IMMATURE GRANULOCYTES: 3 %
LYMPHOCYTES ABSOLUTE COUNT: 0.5 10*3/uL — ABNORMAL LOW (ref 0.7–3.1)
LYMPHOCYTES RELATIVE PERCENT: 17 %
MEAN CORPUSCULAR HEMOGLOBIN CONC: 32.9 g/dL (ref 31.5–35.7)
MEAN CORPUSCULAR HEMOGLOBIN: 36.8 pg — ABNORMAL HIGH (ref 26.6–33.0)
MEAN CORPUSCULAR VOLUME: 112 fL — ABNORMAL HIGH (ref 79–97)
MONOCYTES ABSOLUTE COUNT: 0.2 10*3/uL (ref 0.1–0.9)
MONOCYTES RELATIVE PERCENT: 5 %
NEUTROPHILS ABSOLUTE COUNT: 0.9 10*3/uL — ABNORMAL LOW (ref 1.4–7.0)
NEUTROPHILS RELATIVE PERCENT: 30 %
PLATELET COUNT: 205 10*3/uL (ref 150–450)
RED BLOOD CELL COUNT: 2.28 x10E6/uL — CL (ref 3.77–5.28)
RED CELL DISTRIBUTION WIDTH: 16.4 % — ABNORMAL HIGH (ref 11.7–15.4)
WHITE BLOOD CELL COUNT: 2.9 10*3/uL — ABNORMAL LOW (ref 3.4–10.8)

## 2022-07-04 LAB — RENAL FUNCTION PANEL
ALBUMIN: 3.8 g/dL — ABNORMAL LOW (ref 3.9–4.9)
BLOOD UREA NITROGEN: 19 mg/dL (ref 6–20)
BUN / CREAT RATIO: 10 (ref 9–23)
CALCIUM: 9.3 mg/dL (ref 8.7–10.2)
CHLORIDE: 109 mmol/L — ABNORMAL HIGH (ref 96–106)
CO2: 20 mmol/L (ref 20–29)
CREATININE: 1.93 mg/dL — ABNORMAL HIGH (ref 0.57–1.00)
EGFR: 34 mL/min/{1.73_m2} — ABNORMAL LOW
GLUCOSE: 94 mg/dL (ref 70–99)
PHOSPHORUS, SERUM: 3.7 mg/dL (ref 3.0–4.3)
POTASSIUM: 4.9 mmol/L (ref 3.5–5.2)
SODIUM: 141 mmol/L (ref 134–144)

## 2022-07-04 LAB — MAGNESIUM: MAGNESIUM: 1.7 mg/dL (ref 1.6–2.3)
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
## 2022-07-06 LAB — TACROLIMUS LEVEL: TACROLIMUS BLOOD: 12.8 ng/mL (ref 2.0–20.0)

## 2022-07-09 ENCOUNTER — Encounter (HOSPITAL_COMMUNITY): Payer: Self-pay | Admitting: Psychiatry

## 2022-07-09 ENCOUNTER — Other Ambulatory Visit (HOSPITAL_COMMUNITY): Payer: Medicare Other | Attending: Psychiatry | Admitting: Psychiatry

## 2022-07-09 DIAGNOSIS — Z940 Kidney transplant status: Principal | ICD-10-CM

## 2022-07-09 DIAGNOSIS — N186 End stage renal disease: Secondary | ICD-10-CM | POA: Diagnosis not present

## 2022-07-09 DIAGNOSIS — E11319 Type 2 diabetes mellitus with unspecified diabetic retinopathy without macular edema: Secondary | ICD-10-CM | POA: Diagnosis not present

## 2022-07-09 DIAGNOSIS — F419 Anxiety disorder, unspecified: Secondary | ICD-10-CM | POA: Insufficient documentation

## 2022-07-09 DIAGNOSIS — F321 Major depressive disorder, single episode, moderate: Secondary | ICD-10-CM

## 2022-07-09 DIAGNOSIS — K219 Gastro-esophageal reflux disease without esophagitis: Secondary | ICD-10-CM | POA: Diagnosis not present

## 2022-07-09 DIAGNOSIS — Z794 Long term (current) use of insulin: Secondary | ICD-10-CM | POA: Diagnosis not present

## 2022-07-09 DIAGNOSIS — Z79624 Long term (current) use of inhibitors of nucleotide synthesis: Secondary | ICD-10-CM | POA: Diagnosis not present

## 2022-07-09 DIAGNOSIS — Z7982 Long term (current) use of aspirin: Secondary | ICD-10-CM | POA: Diagnosis not present

## 2022-07-09 DIAGNOSIS — Z79631 Long term (current) use of antimetabolite agent: Secondary | ICD-10-CM | POA: Insufficient documentation

## 2022-07-09 DIAGNOSIS — F32A Depression, unspecified: Secondary | ICD-10-CM | POA: Diagnosis present

## 2022-07-09 DIAGNOSIS — E1122 Type 2 diabetes mellitus with diabetic chronic kidney disease: Secondary | ICD-10-CM | POA: Insufficient documentation

## 2022-07-09 DIAGNOSIS — D573 Sickle-cell trait: Secondary | ICD-10-CM | POA: Diagnosis not present

## 2022-07-09 DIAGNOSIS — Z992 Dependence on renal dialysis: Secondary | ICD-10-CM | POA: Diagnosis not present

## 2022-07-09 DIAGNOSIS — Z79899 Other long term (current) drug therapy: Secondary | ICD-10-CM | POA: Insufficient documentation

## 2022-07-09 DIAGNOSIS — I120 Hypertensive chronic kidney disease with stage 5 chronic kidney disease or end stage renal disease: Secondary | ICD-10-CM | POA: Insufficient documentation

## 2022-07-09 MED ORDER — FLUOXETINE HCL 10 MG PO CAPS: 10.0000 mg | ORAL_CAPSULE | Freq: Every day | ORAL | 2 refills | Status: DC

## 2022-07-09 MED ORDER — TACROLIMUS XR 4 MG TABLET,EXTENDED RELEASE 24 HR
ORAL_TABLET | Freq: Every day | ORAL | 11 refills | 30 days | Status: CP
Start: 2022-07-09 — End: 2023-07-09

## 2022-07-09 MED ORDER — TACROLIMUS XR 1 MG TABLET,EXTENDED RELEASE 24 HR
ORAL_TABLET | Freq: Every day | ORAL | 11 refills | 30 days | Status: CP
Start: 2022-07-09 — End: 2022-07-09
  Filled 2022-07-24: qty 120, 30d supply, fill #0

## 2022-07-09 NOTE — Unmapped (Addendum)
[facility] Pharmacist has reviewed a new prescription for envarsus that indicates a dose decrease.  Patient was counseled on this dosage change by coordinator CC- see epic note from 5/28.  Next refill call date adjusted if necessary.        Clinical Assessment Needed For: Dose Change  Medication: Envarsus XR 4mg  tablet  Last Fill Date/Day Supply: 06/10/2022 / 30 days  Copay $0  Was previous dose already scheduled to fill: No    Notes to Pharmacist: Hold order received for 1mg  tablets

## 2022-07-09 NOTE — Unmapped (Signed)
Pt reports tac trough 12.8 is a true 24 hour level, reviewed with Dr. Carlene Coria, pt advised to HOLD her envarsus tomorrow and starting Thursday, reduce her daily dose to 16 mg.  Pt verbalized understanding.  Pt reports she has started an intensive group therapy program and has a meeting with Bradd Canary this wee.  Her doctor also start her on prozac.  Pt will repeat labs in 7-10 days.

## 2022-07-09 NOTE — Progress Notes (Signed)
Virtual Visit via Video Note   I connected with Christine Reed on 07/09/22 at  9:00 AM EDT by a video enabled telemedicine application and verified that I am speaking with the correct person using two identifiers.   At orientation to the IOP program, Case Manager discussed the limitations of evaluation and management by telemedicine and the availability of in person appointments. The patient expressed understanding and agreed to proceed with virtual visits throughout the duration of the program.   Location:  Patient: Patient Home Provider: OPT BH Office   History of Present Illness: MDD    Observations/Objective: Check In: Case Manager checked in with all participants to review discharge dates, insurance authorizations, work-related documents and needs from the treatment team regarding medications. Christine Reed stated needs and engaged in discussion.    Initial Therapeutic Activity: Counselor facilitated a check-in with Christine Reed to assess for safety, sobriety and medication compliance.  Counselor also inquired about Christine Reed's current emotional ratings, as well as any significant changes in thoughts, feelings or behavior since previous check in.  Christine Reed presented for session on time and was alert, oriented x5, with no evidence or self-report of active SI/HI or A/V H.  Christine Reed reported compliance with medication and denied use of alcohol or illicit substances.  Christine Reed reported scores of 8/10 for depression, 5/10 for anxiety, and 7/10 for anger/irritability.  Christine Reed denied any recent panic attacks.  Christine Reed reported that a recent struggle has been dealing with depression and emotional outbursts, which led her to pursue group treatment to help address her symptoms.  Christine Reed reported that a recent success was attending church with her family over the weekend.  Christine Reed reported that her goal today is to find something to do for self-care, such as visiting the gym.         Second Therapeutic Activity: Counselor engaged the group  in discussion on managing work/life balance today to improve mental health and wellness.  Counselor explained how finding balance between responsibilities at home and work place can be challenging, and lead to increased stress.  Counselor facilitated discussion on what challenges members are currently, or have historically faced.  Counselor also discussed strategies for improving work/life balance while members work on their mental health during treatment.  Some of these included keeping track of time management; creating a list of priorities and scaling importance; setting realistic, measurable goals each day; establishing boundaries; taking care of health needs; and nurturing relationships at home and work for support.  Counselor inquired about areas where members feel they are excelling, as well as areas they could focus on during treatment. Intervention was effective, as evidenced by Christine Reed actively participating in discussion on topic and reporting that although she used to enjoy her job, it has become increasingly difficult for her to manage, which led her to take time off to focus on her mental health.  Christine Reed stated "I love my job.  I love the work I do, but when I go in there and feel overwhelmed by what I have to do, I get to the point where I just don't care".  Christine Reed reported that she experienced several symptoms of burnout, including feeling tired and drained, frequent headaches, lack of motivation, procrastination, and isolation from others.  Christine Reed reported that there have also been numerous warning signs such as feeling unhappy or resentful about the amount of time spent on work related tasks, and neglecting physical health needs due to skipping breaks on shift.  Christine Reed was receptive to suggestions offered today  for addressing work life imbalance, including taking breaks at work to ensure adequate hydration following recent implant, keeping a time log for one week to monitor balance more closely, making a  priority list, making a daily to-do list, and prioritizing more exercise each day.  Christine Reed stated "This was very enlightening. Its good to put names to the symptoms and problems.  I also realized that its not just me complaining and my feelings are valid".    Third Therapeutic Activity: Counselor introduced [employer], [place] Chaplain to provide psychoeducation on topic of Grief and Loss with members today.  Christine Reed began discussion by checking in with the group about their baseline mood today, general thoughts on what grief means to them and how it has affected them personally in the past.  Christine Reed provided information on how the process of grief/loss can differ depending upon one's unique culture, and categories of loss one could experience (i.e. loss of a person, animal, relationship, job, identity, etc).  Christine Reed encouraged members to be mindful of how pervasive loss can be, and how to recognize signs which could indicate that this is having an impact on one's overall mental health and wellbeing.  Intervention effectiveness could not be measured, as client could not participate fully due to meeting separately with NP during this discussion.     Assessment and Plan: Counselor recommends that Christine Reed remain in IOP treatment to better manage mental health symptoms, ensure stability and pursue completion of treatment plan goals. Counselor recommends adherence to crisis/safety plan, taking medications as prescribed, and following up with medical professionals if any issues arise.    Follow Up Instructions: Counselor will send Webex link for session tomorrow.  Christine Reed was advised to call back or seek an in-person evaluation if the symptoms worsen or if the condition fails to improve as anticipated.   Collaboration of Care:   Medication Management AEB Dr. Eliseo Gum or Hillery Jacks, NP                                          Case Manager AEB Jeri Modena, CNA   Patient/Guardian was advised Release of  Information must be obtained prior to any record release in order to collaborate their care with an outside provider. Patient/Guardian was advised if they have not already done so to contact the registration department to sign all necessary forms in order for [place] to release information regarding their care.   Consent: Patient/Guardian gives verbal consent for treatment and assignment of benefits for services provided during this visit. Patient/Guardian expressed understanding and agreed to proceed.  I provided 180 minutes of non-face-to-face time during this encounter.   Noralee Stain, LCSW, LCAS 07/09/22
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
## 2022-07-10 ENCOUNTER — Other Ambulatory Visit (HOSPITAL_COMMUNITY): Payer: Medicare Other | Admitting: Psychiatry

## 2022-07-10 DIAGNOSIS — F321 Major depressive disorder, single episode, moderate: Secondary | ICD-10-CM

## 2022-07-10 DIAGNOSIS — F419 Anxiety disorder, unspecified: Secondary | ICD-10-CM | POA: Diagnosis not present

## 2022-07-10 NOTE — Progress Notes (Signed)
Virtual Visit via Video Note   I connected with Shailyn K. Rion on 07/10/22 at  9:00 AM EDT by a video enabled telemedicine application and verified that I am speaking with the correct person using two identifiers.   At orientation to the IOP program, Case Manager discussed the limitations of evaluation and management by telemedicine and the availability of in person appointments. The patient expressed understanding and agreed to proceed with virtual visits throughout the duration of the program.   Location:  Patient: Patient Home Provider: [facility]   History of Present Illness: MDD    Observations/Objective: Check In: Case Manager checked in with all participants to review discharge dates, insurance authorizations, work-related documents and needs from the treatment team regarding medications. Glen stated needs and engaged in discussion.    Initial Therapeutic Activity: Counselor facilitated a check-in with Thatiana to assess for safety, sobriety and medication compliance.  Counselor also inquired about Kanae's current emotional ratings, as well as any significant changes in thoughts, feelings or behavior since previous check in.  Shadasia presented for session on time and was alert, oriented x5, with no evidence or self-report of active SI/HI or A/V H.  Maeola reported compliance with medication and denied use of alcohol or illicit substances.  Gianna reported scores of 9/10 for depression, 3/10 for anxiety, and 3/10 for anger/irritability.  Wynetta denied any recent outbursts or panic attacks.  Shadavia reported that a recent success was taking a walk with her son for self-care yesterday.  She reported that she also felt good after attending her first group, but upon picking up her prescription from the pharmacy she began to feel bad about herself for needing this assistance, stating "I shouldn't have let it get this bad".  Veda reported that her goal today is to assist her son with getting ready for his  graduation from Kindergarten next week.       Second Therapeutic Activity: Counselor introduced topic of stress management today.  Counselor provided definition of stress as feeling tense, overwhelmed, worn out, and/or exhausted, and noted that in small amounts, stress can be motivating until things become too overwhelming to manage.  Counselor also explained how stress can be acute (brief but intense) or chronic (long-lasting) and this can impact the severity of symptoms one can experience in the physical, emotional, and behavioral categories.  Counselor inquired about members' specific stressors, how long they have been prevalent, and the various symptoms that tend to manifest as a result.  Counselor also offered several stress management strategies to help improve members' coping ability, including journaling, gratitude practice, relaxation techniques, and time management tips.  Counselor also explained that research has shown a strong support network composed of trusted family, friends, or community members can increase resilience in times of stress, and inquired about who members can reach out to for help in managing stressors.  Counselor encouraged members to consider discussing stressor 'red flags' with their close supports that can be monitored and strategies for assisting them in times of crisis.  Intervention was effective, as evidenced by Redith actively participating in discussion on subject, reporting that her most significant stressors include physical health, her job, communication in her relationships, and news of war.  Kalina was able to identify several warning signs related to stress, including headaches, frustration, crying, tension, blaming, and outbursts.  Ernisha reported that her stress management goal is to keep her average stress level at 6/10 or below for the next 5 days by setting healthier boundaries  in her work and personal life.  Tynasia also expressed receptiveness to several stress  management strategies practiced today in session, including practicing a deep breathing exercise, and mindful meditation.     Assessment and Plan: Counselor recommends that Dan remain in IOP treatment to better manage mental health symptoms, ensure stability and pursue completion of treatment plan goals. Counselor recommends adherence to crisis/safety plan, taking medications as prescribed, and following up with medical professionals if any issues arise.    Follow Up Instructions: Counselor will send Webex link for session tomorrow.  Sheron was advised to call back or seek an in-person evaluation if the symptoms worsen or if the condition fails to improve as anticipated.   Collaboration of Care:   Medication Management AEB Dr. Eliseo Gum or Hillery Jacks, NP                                          Case Manager AEB Jeri Modena, CNA   Patient/Guardian was advised Release of Information must be obtained prior to any record release in order to collaborate their care with an outside provider. Patient/Guardian was advised if they have not already done so to contact the registration department to sign all necessary forms in order for [place] to release information regarding their care.   Consent: Patient/Guardian gives verbal consent for treatment and assignment of benefits for services provided during this visit. Patient/Guardian expressed understanding and agreed to proceed.  I provided 180 minutes of non-face-to-face time during this encounter.   Noralee Stain, LCSW, LCAS 07/10/22

## 2022-07-11 ENCOUNTER — Encounter (HOSPITAL_COMMUNITY): Payer: Self-pay

## 2022-07-11 ENCOUNTER — Other Ambulatory Visit (HOSPITAL_COMMUNITY): Payer: Medicare Other | Admitting: Licensed Clinical Social Worker

## 2022-07-11 DIAGNOSIS — F321 Major depressive disorder, single episode, moderate: Secondary | ICD-10-CM

## 2022-07-11 DIAGNOSIS — F419 Anxiety disorder, unspecified: Secondary | ICD-10-CM | POA: Diagnosis not present

## 2022-07-11 NOTE — Progress Notes (Signed)
Virtual Visit via Video Note   I connected with Christine Reed on 07/11/22 at  9:00 AM EDT by a video enabled telemedicine application and verified that I am speaking with the correct person using two identifiers.   At orientation to the IOP program, Case Manager discussed the limitations of evaluation and management by telemedicine and the availability of in person appointments. The patient expressed understanding and agreed to proceed with virtual visits throughout the duration of the program.   Location:  Patient: Patient Home Provider: [facility]   History of Present Illness: MDD    Observations/Objective: Check In: Case Manager checked in with all participants to review discharge dates, insurance authorizations, work-related documents and needs from the treatment team regarding medications. Christine Reed stated needs and engaged in discussion.    Initial Therapeutic Activity: Counselor facilitated a check-in with Christine Reed to assess for safety, sobriety and medication compliance.  Counselor also inquired about Sibyl's current emotional ratings, as well as any significant changes in thoughts, feelings or behavior since previous check in.  Christine Reed presented for session on time and was alert, oriented x5, with no evidence or self-report of active SI/HI or A/V H.  Christine Reed reported compliance with medication and denied use of alcohol or illicit substances.  Christine Reed reported scores of 7/10 for depression, 5/10 for anxiety, and 9/10 for anger/irritability.  Christine Reed denied any recent panic attacks.  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  Christine Reed reported that a recent struggle was having an outburst in the car this morning, stating "Its so frustrating.  I can't be sick in peace.  There's so much going on".  Christine Reed reported that her goal today is to take her son to the doctor.       Second Therapeutic Activity: Counselor introduced topic of assertive  communication today.  Counselor shared various handouts with members virtually in group to read along with on the subject.  These handouts defined assertive communication as a communication style in which a person stands up for their own needs and wants, while also taking into consideration the needs and wants of others, without behaving in a passive or aggressive way.  Traits of assertive communicators were highlighted such as using appropriate speaking volume, maintaining eye contact, using confident language, and avoiding interruption.  Members were also provided with tips on how to improve communication, including respecting oneself, expressing thoughts and feelings calmly, and saying "No" when necessary.  Members were given a variety of scenarios where they could practice using these tips to respond in an assertive manner.  Intervention was effective, as evidenced by Christine Reed participating in discussion on topic, reporting that she has a passive communication style due to traits such as having poor eye contact, allowing others to take advantage of her kindness, and lacking confidence.  Christine Reed showed more effective use of assertive communication skills through engagement in roleplay activities.    Assessment and Plan: Counselor recommends that Christine Reed remain in IOP treatment to better manage mental health symptoms, ensure stability and pursue completion of treatment plan goals. Counselor recommends adherence to crisis/safety plan, taking medications as prescribed, and following up with medical professionals if any issues arise.    Follow Up Instructions: Counselor will send Webex link for session tomorrow.  Christine Reed was advised to call back or seek an in-person evaluation if the symptoms worsen or if the condition fails to improve as anticipated.   Collaboration of Care:   Medication Management  AEB Dr. Eliseo Gum or Hillery Jacks, NP                                          Case Manager AEB Jeri Modena,  CNA   Patient/Guardian was advised Release of Information must be obtained prior to any record release in order to collaborate their care with an outside provider. Patient/Guardian was advised if they have not already done so to contact the registration department to sign all necessary forms in order for [place] to release information regarding their care.   Consent: Patient/Guardian gives verbal consent for treatment and assignment of benefits for services provided during this visit. Patient/Guardian expressed understanding and agreed to proceed.  I provided 180 minutes of non-face-to-face time during this encounter.   Christine Stain, LCSW, LCAS 07/11/22

## 2022-07-12 ENCOUNTER — Other Ambulatory Visit (HOSPITAL_COMMUNITY): Payer: Medicare Other

## 2022-07-12 ENCOUNTER — Telehealth: Admit: 2022-07-12 | Discharge: 2022-07-13 | Payer: MEDICARE | Attending: Clinical | Primary: Clinical

## 2022-07-12 DIAGNOSIS — F332 Major depressive disorder, recurrent severe without psychotic features: Principal | ICD-10-CM

## 2022-07-12 DIAGNOSIS — F4310 Post-traumatic stress disorder, unspecified: Principal | ICD-10-CM
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
## 2022-07-13 ENCOUNTER — Encounter (HOSPITAL_COMMUNITY): Payer: Self-pay

## 2022-07-15 ENCOUNTER — Other Ambulatory Visit (HOSPITAL_COMMUNITY): Payer: Medicare Other | Admitting: Psychiatry

## 2022-07-15 ENCOUNTER — Telehealth (HOSPITAL_COMMUNITY): Payer: Self-pay | Admitting: Psychiatry

## 2022-07-15 DIAGNOSIS — Z79899 Other long term (current) drug therapy: Principal | ICD-10-CM

## 2022-07-15 DIAGNOSIS — Z940 Kidney transplant status: Principal | ICD-10-CM

## 2022-07-15 NOTE — Unmapped (Signed)
[facility] has made a third and final attempt to reach this patient to refill the following medication:envarsus, azathioprine.      We have left voicemails on the following phone numbers: [phone], have sent a MyChart message, and have sent a Mychart questionnaire..    Dates contacted: 5/20, 5/30, 6/3  Last scheduled delivery: 4/29    The patient may be at risk of non-compliance with this medication. The patient should call the [facility] at [phone]  Option 4, then Option 4: Infectious Disease, Transplant to refill medication.    Thad Ranger, PharmD   [facility] Specialty Pharmacist

## 2022-07-16 ENCOUNTER — Other Ambulatory Visit (HOSPITAL_COMMUNITY): Payer: Medicare Other

## 2022-07-17 ENCOUNTER — Other Ambulatory Visit (HOSPITAL_COMMUNITY): Payer: Medicare Other | Attending: Psychiatry | Admitting: Licensed Clinical Social Worker

## 2022-07-17 DIAGNOSIS — F32A Depression, unspecified: Secondary | ICD-10-CM | POA: Diagnosis present

## 2022-07-17 DIAGNOSIS — F321 Major depressive disorder, single episode, moderate: Secondary | ICD-10-CM

## 2022-07-17 DIAGNOSIS — F331 Major depressive disorder, recurrent, moderate: Secondary | ICD-10-CM | POA: Insufficient documentation

## 2022-07-17 DIAGNOSIS — F419 Anxiety disorder, unspecified: Secondary | ICD-10-CM | POA: Diagnosis present

## 2022-07-17 NOTE — Progress Notes (Signed)
Virtual Visit via Video Note   I connected with Christine Reed on 07/17/22 at  9:00 AM EDT by a video enabled telemedicine application and verified that I am speaking with the correct person using two identifiers.   At orientation to the IOP program, Case Manager discussed the limitations of evaluation and management by telemedicine and the availability of in person appointments. The patient expressed understanding and agreed to proceed with virtual visits throughout the duration of the program.   Location:  Patient: Patient Home Provider: OPT BH Office   History of Present Illness: MDD    Observations/Objective: Check In: Case Manager checked in with all participants to review discharge dates, insurance authorizations, work-related documents and needs from the treatment team regarding medications. Christine Reed stated needs and engaged in discussion.    Initial Therapeutic Activity: Counselor facilitated a check-in with Christine Reed to assess for safety, sobriety and medication compliance.  Counselor also inquired about Christine Reed's current emotional ratings, as well as any significant changes in thoughts, feelings or behavior since previous check in.  Christine Reed presented for session on time and was alert, oriented x5, with no evidence or self-report of active SI/HI or A/V H.  Christine Reed reported compliance with medication and denied use of alcohol or illicit substances.  Christine Reed reported scores of 9/10 for depression, 9/10 for anxiety, and 10/10 for anger/irritability.  Christine Reed denied any recent panic attacks.  Christine Reed reported that a recent struggle was handling her son's graduation from kindergarten, since he has grown up so quickly, and the father is in town, who "Conjures up bad feelings".  Christine Reed reported that this stress culminated in an outburst, stating "It was too overwhelming this weekend".  Christine Reed reported that her goal today is to go to the gym for self-care.       Second Therapeutic Activity: Counselor introduced Christine Reed, [employer] Pharmacist, to provide psychoeducation on topic of medication compliance with members today.  Christine Reed provided psychoeducation on classes of medications such as antidepressants, antipsychotics, what symptoms they are intended to treat, and any side effects one might encounter while on a particular prescription.  Time was allowed for clients to ask any questions they might have of [facility] regarding this specialty.  Intervention effectiveness could not be measured, as client did not participate  Third Therapeutic Activity: Counselor introduced topic of creating mental health maintenance plan today.  Counselor provided handout on subject to members, which stressed the importance of maintaining one's mental health in a similar way to using diet and exercise to ensure physical health.  Counselor walked members through process of identifying triggers which could worsen symptoms, including specific people, places, and things one needs to avoid.  Members were also tasked with identifying warning signs such as thoughts, feelings, or behaviors which could indicate mental health is at increased risk.  Counselor also facilitated conversation on self-care activities and coping strategies which members have previously utilized in the past, are currently using in daily routine, or plan to use soon to assist with managing problems or symptoms when/if they appear.  Counselor encouraged members to revisit their maintenance plan often and make changes as needed to ensure day to day stability.  Intervention was effective, as evidenced by Christine Reed participating in activity and creating a comprehensive plan, including identification of triggers such as being in a Reed, having someone 'talk down' to her as if she has less value, or struggling to articulate her thoughts or feelings to someone, and warning signs including emotionally shutting down, feeling shaky,  sweaty, isolating, or having headaches.  Christine Reed also reported that  she would make an effort to set aside time for self-care activities such as getting out of the house more often to take walks, dancing, or getting back into an exercise routine, and use coping skills such as meditation or counting to 10 in order to manage stressors.    Assessment and Plan: Counselor recommends that Christine Reed remain in IOP treatment to better manage mental health symptoms, ensure stability and pursue completion of treatment plan goals. Counselor recommends adherence to crisis/safety plan, taking medications as prescribed, and following up with medical professionals if any issues arise.    Follow Up Instructions: Counselor will send Webex link for session tomorrow.  Christine Reed was advised to call back or seek an in-person evaluation if the symptoms worsen or if the condition fails to improve as anticipated.   Collaboration of Care:   Medication Management AEB Dr. Eliseo Reed or Christine Jacks, NP                                          Case Manager AEB Christine Modena, CNA   Patient/Guardian was advised Release of Information must be obtained prior to any record release in order to collaborate their care with an outside provider. Patient/Guardian was advised if they have not already done so to contact the registration department to sign all necessary forms in order for [place] to release information regarding their care.   Consent: Patient/Guardian gives verbal consent for treatment and assignment of benefits for services provided during this visit. Patient/Guardian expressed understanding and agreed to proceed.  I provided 180 minutes of non-face-to-face time during this encounter.   Christine Stain, LCSW, LCAS 07/17/22

## 2022-07-18 ENCOUNTER — Other Ambulatory Visit (HOSPITAL_COMMUNITY): Payer: Medicare Other | Admitting: Licensed Clinical Social Worker

## 2022-07-18 DIAGNOSIS — F331 Major depressive disorder, recurrent, moderate: Secondary | ICD-10-CM | POA: Diagnosis not present

## 2022-07-18 DIAGNOSIS — F321 Major depressive disorder, single episode, moderate: Secondary | ICD-10-CM

## 2022-07-18 NOTE — Progress Notes (Signed)
Virtual Visit via Video Note   I connected with Amali K. Fiorenza on 07/18/22 at  9:00 AM EDT by a video enabled telemedicine application and verified that I am speaking with the correct person using two identifiers.   At orientation to the IOP program, Case Manager discussed the limitations of evaluation and management by telemedicine and the availability of in person appointments. The patient expressed understanding and agreed to proceed with virtual visits throughout the duration of the program.   Location:  Patient: Patient Home Provider: [facility]   History of Present Illness: MDD    Observations/Objective: Check In: Case Manager checked in with all participants to review discharge dates, insurance authorizations, work-related documents and needs from the treatment team regarding medications. Dmiyah stated needs and engaged in discussion.    Initial Therapeutic Activity: Counselor facilitated a check-in with Josephyne to assess for safety, sobriety and medication compliance.  Counselor also inquired about Madysen's current emotional ratings, as well as any significant changes in thoughts, feelings or behavior since previous check in.  Roniyah presented for session on time and was alert, oriented x5, with no evidence or self-report of active SI/HI or A/V H.  Stamatia reported compliance with medication and denied use of alcohol or illicit substances.  Nicol reported scores of 4/10 for depression, 6/10 for anxiety, and 5/10 for anger/irritability.  Laportia denied any recent outbursts or panic attacks.  Latoia reported that a recent success was getting out of the house yesterday, treating herself to [facility], doing some schoolwork, and playing basketball for self-care.  Sway reported that a struggle was having a sinus headache after being outside too long.  Demetric reported that her goal today is to take a walk around the neighborhood with her son if the weather is fair.       Second Therapeutic Activity:  Counselor introduced topic of building a social support network today.  Counselor explained how this can be defined as having a having a group of healthy people in one's life you can talk to, spend time with, and get help from to improve both mental and physical health.  Counselor noted that some barriers can make it difficult to connect with other people, including the presence of anxiety or depression, or moving to an unfamiliar area.  Group members were asked to assess the current state of their support network, and identify ways that this could be improved.  Tips were given on how to address previously noted barriers, such as strengthening social skills, using relaxation techniques to reduce anxiety, scheduling social time each week, and/or exploring social events nearby which could increase chances of meeting new supports.  Members were also encouraged to consider getting closer to people they already know through suggestions such as outreaching someone by text, email or phone call if they haven't spoken in awhile, doing something nice for a friend/family member unexpectedly, and/or inviting someone over for a game/movie/dinner night.  Intervention was effective, as evidenced by Tristan actively participating in discussion on the subject, and reporting that she would benefit from strengthening connections with existing supports following an extended period of isolation.  Ophia stated "I have a really good support system, but I push them away so often.  when I'm sad or anxious, I just can't deal with people".  She reported that her goal will be to start taking walks with her mother to get out of the house, set dinner dates with friends, follow through more consistently when plans are made with friends  and be more assertive about what kinds of plans she is comfortable with so that she can avoid taking social risks that are too overwhelming.  Maddyn stated "This makes me feel like its achievable.  I just need to set  smaller goals".      Assessment and Plan: Counselor recommends that Sissy remain in IOP treatment to better manage mental health symptoms, ensure stability and pursue completion of treatment plan goals. Counselor recommends adherence to crisis/safety plan, taking medications as prescribed, and following up with medical professionals if any issues arise.    Follow Up Instructions: Counselor will send Webex link for session tomorrow.  Vernicia was advised to call back or seek an in-person evaluation if the symptoms worsen or if the condition fails to improve as anticipated.   Collaboration of Care:   Medication Management AEB Dr. Eliseo Gum or Hillery Jacks, NP                                          Case Manager AEB Jeri Modena, CNA   Patient/Guardian was advised Release of Information must be obtained prior to any record release in order to collaborate their care with an outside provider. Patient/Guardian was advised if they have not already done so to contact the registration department to sign all necessary forms in order for [place] to release information regarding their care.   Consent: Patient/Guardian gives verbal consent for treatment and assignment of benefits for services provided during this visit. Patient/Guardian expressed understanding and agreed to proceed.  I provided 180 minutes of non-face-to-face time during this encounter.   Noralee Stain, LCSW, LCAS 07/18/22

## 2022-07-19 ENCOUNTER — Other Ambulatory Visit (HOSPITAL_COMMUNITY): Payer: Medicare Other | Admitting: Licensed Clinical Social Worker

## 2022-07-19 DIAGNOSIS — F331 Major depressive disorder, recurrent, moderate: Secondary | ICD-10-CM | POA: Diagnosis not present

## 2022-07-19 DIAGNOSIS — F321 Major depressive disorder, single episode, moderate: Secondary | ICD-10-CM
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
## 2022-07-22 ENCOUNTER — Other Ambulatory Visit (HOSPITAL_COMMUNITY): Payer: Medicare Other | Admitting: Licensed Clinical Social Worker

## 2022-07-22 DIAGNOSIS — F321 Major depressive disorder, single episode, moderate: Secondary | ICD-10-CM

## 2022-07-22 DIAGNOSIS — F331 Major depressive disorder, recurrent, moderate: Secondary | ICD-10-CM | POA: Diagnosis not present

## 2022-07-22 MED ORDER — ESCITALOPRAM OXALATE 5 MG PO TABS
5.0000 mg | ORAL_TABLET | Freq: Every day | ORAL | 0 refills | Status: DC
Start: 2022-07-22 — End: 2022-08-09

## 2022-07-22 NOTE — Progress Notes (Signed)
Virtual Visit via Video Note  I connected with Christine Reed on 07/22/22 at  9:00 AM EDT by a video enabled telemedicine application and verified that I am speaking with the correct person using two identifiers.  Location: Patient: Home Provider: office   I discussed the limitations of evaluation and management by telemedicine and the availability of in person appointments. The patient expressed understanding and agreed to proceed.     I discussed the assessment and treatment plan with the patient. The patient was provided an opportunity to ask questions and all were answered. The patient agreed with the plan and demonstrated an understanding of the instructions.   The patient was advised to call back or seek an in-person evaluation if the symptoms worsen or if the condition fails to improve as anticipated.    Bobbye Morton, MD  [facility] MD/PA/NP OP Progress Note  07/22/2022 5:36 PM Christine Reed  MRN:  [id number]  Chief Complaint:  Chief Complaint  Patient presents with   Depression   HPI: Christine Reed is a 37 year old African-American female who presents after referral from the [facility] urgent care facility. Admitted 07/09/2022.   Patient reports that she is very tired and her energy level is low. Patient does not think she having any confusion or cloudiness in thought. Patient reports that despite feeling tired, she is going to the gym 76min/day and reports she is staying hydrated with waters. Patient reports that she is not drinking any added protein, not pre or post-workout. Patient reports that her tiredness has been worse than normal since starting the prozac. Patient reports that her appetite has not been stable but this is moreso due to GI issues. Patient reports that she is sleeping through the night but also the day. Patient reports that she denies anhedonia, but endorses a lot of guilt. She reports feeling bad for having good days. Patient denies SI, HI, and AVH.   Patient  reports that she has been feeling more irritable and frequently worried.  Visit Diagnosis:    ICD-10-CM   1. Current moderate episode of major depressive disorder without prior episode (HCC)  F32.1 escitalopram (LEXAPRO) 5 MG tablet      Past Psychiatric History: Previous meds: Wellbutrin (was not compliant), Lexapro (poor compliant)  Past Medical History:  Past Medical History:  Diagnosis Date   Anemia    C. difficile diarrhea    Chronic kidney disease    Pt is on dialysis   Depression    Diabetes mellitus    diagnosed at age 29; Type 1   Diabetic retinopathy (HCC)    GERD (gastroesophageal reflux disease)    Headache    Hypertension    Sickle cell trait (HCC)     Past Surgical History:  Procedure Laterality Date   AV FISTULA PLACEMENT Right 10/10/2016   Procedure: CREATION OF RIGHT ARM RADIOCEPHALIC ARTERIOVENOUS (AV) FISTULA;  Surgeon: Nada Libman, MD;  Location: [facility] OR;  Service: Vascular;  Laterality: Right;   CESAREAN SECTION N/A 10/19/2015   Procedure: CESAREAN SECTION;  Surgeon: Hermina Staggers, MD;  Location: [facility] BIRTHING SUITES;  Service: Obstetrics;  Laterality: N/A;   KIDNEY TRANSPLANT     lipo suction  2015   TONSILLECTOMY     TONSILLECTOMY AND ADENOIDECTOMY      Family Psychiatric History: unknown at this time  Family History:  Family History  Problem Relation Age of Onset   Diabetes Mother    Hypertension Mother  Alcohol abuse Father    Diabetes Father    Hypertension Father    Sickle cell trait Father    Alcohol abuse Paternal Aunt    Alcohol abuse Paternal Uncle    Diabetes Maternal Grandfather    Hypertension Maternal Grandfather    Cancer Maternal Grandmother        colon and breast   Hypertension Maternal Grandmother    Breast cancer Maternal Grandmother    Alcohol abuse Paternal Grandfather    Diabetes Paternal Grandfather    Hypertension Paternal Grandfather    Diabetes Paternal Grandmother    Hypertension Paternal Grandmother      Social History:  Social History   Socioeconomic History   Marital status: Single    Spouse name: Not on file   Number of children: 1   Years of education: some college   Highest education level: Not on file  Occupational History   Occupation: Disabled  Tobacco Use   Smoking status: Never   Smokeless tobacco: Never  Vaping Use   Vaping Use: Never used  Substance and Sexual Activity   Alcohol use: Yes    Comment: very rarely   Drug use: No   Sexual activity: Not Currently    Birth control/protection: None  Other Topics Concern   Not on file  Social History Narrative   Lives at home with mother.   Right-handed.   4-5 cups caffeine per week.   Social Determinants of Health   Financial Resource Strain: Not on file  Food Insecurity: Not on file  Transportation Needs: Not on file  Physical Activity: Not on file  Stress: Not on file  Social Connections: Not on file    Allergies:  Allergies  Allergen Reactions   Ivp Dye [Iodinated Contrast Media] Other (See Comments)    Burning    Propranolol Hcl Er Swelling   Iodine Rash   Nickel Rash    Metabolic Disorder Labs: Lab Results  Component Value Date   HGBA1C 8.5 (H) 01/06/2019   MPG 197.25 01/06/2019   MPG 131 07/17/2015   No results found for: "PROLACTIN" Lab Results  Component Value Date   CHOL 143 09/09/2018   TRIG 122.0 09/09/2018   HDL 66.90 09/09/2018   CHOLHDL 2 09/09/2018   VLDL 24.4 09/09/2018   LDLCALC 52 09/09/2018   LDLCALC 103 (H) 09/04/2016   Lab Results  Component Value Date   TSH 1.67 01/29/2019   TSH 2.444 01/06/2019    Therapeutic Level Labs: No results found for: "LITHIUM" No results found for: "VALPROATE" No results found for: "CBMZ"  Current Medications: Current Outpatient Medications  Medication Sig Dispense Refill   escitalopram (LEXAPRO) 5 MG tablet Take 1 tablet (5 mg total) by mouth daily. 30 tablet 0   amLODipine (NORVASC) 10 MG tablet Take 1 tablet (10 mg total)  by mouth daily. 30 tablet 0   aspirin EC 81 MG tablet Take 81 mg by mouth daily. Swallow whole.     azaTHIOprine (IMURAN) 50 MG tablet Take 50 mg by mouth in the morning and at bedtime.     Biotin 1000 MCG CHEW Chew by mouth.     FLUoxetine (PROZAC) 10 MG capsule Take 1 capsule (10 mg total) by mouth daily. 30 capsule 2   glucagon (GLUCAGON EMERGENCY) 1 MG injection Inject 1 mg into the skin once as needed for up to 1 dose. Inject 1mg  into skin once as needed for low blood sugar. 1 each 12   insulin aspart (NOVOLOG)  100 UNIT/ML injection Inject 0-20 Units into the skin 3 (three) times daily before meals. Sliding scale 10 mL 3   Insulin Detemir (LEVEMIR FLEXTOUCH) 100 UNIT/ML Pen INJECT 10 UNITS INTO THE SKIN IN THE MORNING AND INJECT 11 UNITS INTO THE SKIN IN THE EVENING. (Patient taking differently: Inject 10-11 Units into the skin See admin instructions. Inject 10 units in the morning and 11 units in the evening.) 3 pen 3   loperamide (IMODIUM) 2 MG capsule Take 1 capsule (2 mg total) by mouth 4 (four) times daily as needed for diarrhea or loose stools. 25 capsule 0   losartan (COZAAR) 50 MG tablet Take 50 mg by mouth at bedtime.     metoprolol succinate (TOPROL-XL) 50 MG 24 hr tablet Take 50 mg by mouth daily.     metroNIDAZOLE (FLAGYL) 500 MG tablet Take 500 mg by mouth 2 (two) times daily.     ondansetron (ZOFRAN) 4 MG tablet Take 1 tablet (4 mg total) by mouth every 6 (six) hours. 12 tablet 0   promethazine-dextromethorphan (PROMETHAZINE-DM) 6.25-15 MG/5ML syrup Take 5 mLs by mouth 4 (four) times daily as needed for cough. 118 mL 0   sevelamer (RENAGEL) 800 MG tablet Take 2,400 mg by mouth See admin instructions. Take 2400mg  three times daily with each meal and snacks  12   sodium bicarbonate 650 MG tablet Take 1,300 mg by mouth 3 (three) times daily.     Tacrolimus ER (ENVARSUS XR) 4 MG TB24 Take 16 mg by mouth.     No current facility-administered medications for this visit.     Psychiatric Specialty Exam: Review of Systems  Psychiatric/Behavioral:  Positive for dysphoric mood and sleep disturbance. Negative for hallucinations and suicidal ideas.     unknown if currently breastfeeding.There is no height or weight on file to calculate BMI.  General Appearance: NA  Eye Contact:  NA  Speech:  Clear and Coherent  Volume:  Decreased  Mood:  Dysphoric  Affect:  NA  Thought Process:  Coherent  Orientation:  Full (Time, Place, and Person)  Thought Content: Logical   Suicidal Thoughts:  No  Homicidal Thoughts:  No  Memory:  Immediate;   Good Recent;   Good  Judgement:  Good  Insight:  Fair  Psychomotor Activity:  NA  Concentration:  Concentration: Fair  Recall:  NA  Fund of Knowledge: Good  Language: Good  Akathisia:  No  Handed:    AIMS (if indicated): not done  Assets:  Communication Skills Desire for Improvement Housing Resilience  ADL's:  Intact  Cognition: WNL  Sleep:  NA   Screenings: GAD-7    Flowsheet Row Postpartum Visit from 11/30/2015 in [facility] from 10/27/2015 in [facility] Routine Prenatal from 10/17/2015 in Center for [facility] Documentation from 08/28/2015 in Center for [facility]  Total GAD-7 Score 12 13 12 6       PHQ2-9    Flowsheet Row Counselor from 07/04/2022 in [facility] Office Visit from 01/29/2019 in [facility] [place] Postpartum Visit from 11/30/2015 in Center for [facility] from 10/27/2015 in Center for [facility] Routine Prenatal from 10/17/2015 in Center for [facility]  PHQ-2 Total Score 6 0 0 2 3  PHQ-9 Total Score 26 0 8 7 14       Flowsheet Row Counselor from 07/04/2022 in BE[facility]AVIORAL HEALTH INTENS[facility] ED from 07/02/2022  in [facility] ED from 05/03/2022 in [facility] Emergency Department at [facility]  C-SSRS RISK CATEGORY Error: Question 6 not populated No Risk No Risk        Assessment and Plan: Patient is endorsing feeling oversedated on Prozac.  Attempted to rule out any physical cause, at this time it does appear that patient's oversedated feeling coincides with starting Prozac.  We will trial patient on Lexapro.  Patient's potassium will continue to be monitored by her transplant physicians as all her other labs, she did get lab work done today.  Will need to be mindful of this in the future as it increases her risk for arrhythmias while on SSRI as well.  We will start patient on a lower dose given elevated creatinine at last check.  MDD, recurrent, moderate Anxiety, unspecified - Start Lexapro 5 mg daily -DC Prozac  Collaboration of Care: Collaboration of Care:   Patient/Guardian was advised Release of Information must be obtained prior to any record release in order to collaborate their care with an outside provider. Patient/Guardian was advised if they have not already done so to contact the registration department to sign all necessary forms in order for [place] to release information regarding their care.   Consent: Patient/Guardian gives verbal consent for treatment and assignment of benefits for services provided during this visit. Patient/Guardian expressed understanding and agreed to proceed.   PGY-3 Bobbye Morton, MD 07/22/2022, 5:36 PM

## 2022-07-22 NOTE — Progress Notes (Signed)
Virtual Visit via Video Note   I connected with Christine Reed on 07/22/22 at  9:00 AM EDT by a video enabled telemedicine application and verified that I am speaking with the correct person using two identifiers.   At orientation to the IOP program, Case Manager discussed the limitations of evaluation and management by telemedicine and the availability of in person appointments. The patient expressed understanding and agreed to proceed with virtual visits throughout the duration of the program.   Location:  Patient: [facility] Home Provider: [facility]   History of Present Illness: MDD    Observations/Objective: Check In: Case Manager checked in with all participants to review discharge dates, insurance authorizations, work-related documents and needs from the treatment team regarding medications. Christine Reed stated needs and engaged in discussion.    Initial Therapeutic Activity: Counselor facilitated a check-in with Christine Reed to assess for safety, sobriety and medication compliance.  Counselor also inquired about Christine Reed's current emotional ratings, as well as any significant changes in thoughts, feelings or behavior since previous check in.  Christine Reed presented for session on time and was alert, oriented x5, with no evidence or self-report of active SI/HI or A/V H.  Christine Reed reported compliance with medication and denied use of alcohol or illicit substances.  Christine Reed reported scores of 8/10 for depression, 5/10 for anxiety, and 9/10 for anger/irritability.  Christine Reed denied any recent outbursts or panic attacks.  Christine Reed reported that a recent success was attending a graduation party over the weekend, which kept her preoccupied.  Christine Reed reported that a recent struggle has been sleeping a lot through the day, but still feeling tired, which she intends to discuss with the psychiatrist.  Christine Reed reported that her goal today is to consider going to the [facility] in order to get a new license.       Second Therapeutic Activity:  Counselor invited members to participate in peaceful place guided imagery activity today.  Counselor explained how this is a powerful visualization tool which can aid in reducing stress while increasing sense of calm, control, and awareness if practiced regularly.  Counselor informed members beforehand that if they became uncomfortable at any point during activity, they could stop and open their eyes.  Counselor invited members to get comfortable, achieve a relaxing breathing rhythm, close their eyes, and then guided them through process of creating a 'peaceful place' which filled them with safety and calm.  Counselor encouraged members to include sensory details involving vision, sound, touch, smell, and taste which they considered pleasant to enhance experience.  After practicing in session, counselor invited members to share their opinion on the activity, including whether they were able to imagine a specific place, what details stood out to them, and how this made them feel during and after.  Intervention was effective, as evidenced by Christine Reed successfully participating in activity, and reporting that it left her feeling relaxed, with a more positive attitude, although she wasn't able to picture a specific safe space in her mind.  Analyce stated "It was hard to focus at first".  She expressed interest in practicing on her own to aid in mood regulation.    Assessment and Plan: Counselor recommends that Christine Reed remain in IOP treatment to better manage mental health symptoms, ensure stability and pursue completion of treatment plan goals. Counselor recommends adherence to crisis/safety plan, taking medications as prescribed, and following up with medical professionals if any issues arise.    Follow Up Instructions: Counselor will send Webex link for session tomorrow.  Christine Reed was advised to call back or seek an in-person evaluation if the symptoms worsen or if the condition fails to improve as anticipated.    Collaboration of Care:   Medication Management AEB Dr. Eliseo Gum or Hillery Jacks, NP                                          Case Manager AEB Jeri Modena, CNA   Patient/Guardian was advised Release of Information must be obtained prior to any record release in order to collaborate their care with an outside provider. Patient/Guardian was advised if they have not already done so to contact the registration department to sign all necessary forms in order for [place] to release information regarding their care.   Consent: Patient/Guardian gives verbal consent for treatment and assignment of benefits for services provided during this visit. Patient/Guardian expressed understanding and agreed to proceed.  I provided 180 minutes of non-face-to-face time during this encounter.   Noralee Stain, LCSW, LCAS 07/22/22

## 2022-07-23 ENCOUNTER — Other Ambulatory Visit (HOSPITAL_COMMUNITY): Payer: Medicare Other | Admitting: Licensed Clinical Social Worker

## 2022-07-23 DIAGNOSIS — F331 Major depressive disorder, recurrent, moderate: Secondary | ICD-10-CM | POA: Diagnosis not present

## 2022-07-23 DIAGNOSIS — F321 Major depressive disorder, single episode, moderate: Secondary | ICD-10-CM

## 2022-07-23 LAB — RENAL FUNCTION PANEL
ALBUMIN: 4.2 g/dL (ref 3.9–4.9)
BLOOD UREA NITROGEN: 25 mg/dL — ABNORMAL HIGH (ref 6–20)
BUN / CREAT RATIO: 13 (ref 9–23)
CALCIUM: 9.3 mg/dL (ref 8.7–10.2)
CHLORIDE: 107 mmol/L — ABNORMAL HIGH (ref 96–106)
CO2: 20 mmol/L (ref 20–29)
CREATININE: 1.96 mg/dL — ABNORMAL HIGH (ref 0.57–1.00)
EGFR: 34 mL/min/{1.73_m2} — ABNORMAL LOW
GLUCOSE: 85 mg/dL (ref 70–99)
PHOSPHORUS, SERUM: 4 mg/dL (ref 3.0–4.3)
POTASSIUM: 4.8 mmol/L (ref 3.5–5.2)
SODIUM: 142 mmol/L (ref 134–144)

## 2022-07-23 LAB — CBC W/ DIFFERENTIAL
BANDED NEUTROPHILS ABSOLUTE COUNT: 0 10*3/uL (ref 0.0–0.1)
BASOPHILS ABSOLUTE COUNT: 0 10*3/uL (ref 0.0–0.2)
BASOPHILS RELATIVE PERCENT: 2 %
EOSINOPHILS ABSOLUTE COUNT: 0.4 10*3/uL (ref 0.0–0.4)
EOSINOPHILS RELATIVE PERCENT: 20 %
HEMATOCRIT: 25.8 % — ABNORMAL LOW (ref 34.0–46.6)
HEMOGLOBIN: 8.8 g/dL — ABNORMAL LOW (ref 11.1–15.9)
IMMATURE GRANULOCYTES: 2 %
LYMPHOCYTES ABSOLUTE COUNT: 0.4 10*3/uL — ABNORMAL LOW (ref 0.7–3.1)
LYMPHOCYTES RELATIVE PERCENT: 21 %
MEAN CORPUSCULAR HEMOGLOBIN CONC: 34.1 g/dL (ref 31.5–35.7)
MEAN CORPUSCULAR HEMOGLOBIN: 37.4 pg — ABNORMAL HIGH (ref 26.6–33.0)
MEAN CORPUSCULAR VOLUME: 110 fL — ABNORMAL HIGH (ref 79–97)
MONOCYTES ABSOLUTE COUNT: 0.2 10*3/uL (ref 0.1–0.9)
MONOCYTES RELATIVE PERCENT: 7 %
NEUTROPHILS ABSOLUTE COUNT: 1 10*3/uL — ABNORMAL LOW (ref 1.4–7.0)
NEUTROPHILS RELATIVE PERCENT: 48 %
PLATELET COUNT: 189 10*3/uL (ref 150–450)
RED BLOOD CELL COUNT: 2.35 x10E6/uL — CL (ref 3.77–5.28)
RED CELL DISTRIBUTION WIDTH: 16.6 % — ABNORMAL HIGH (ref 11.7–15.4)
WHITE BLOOD CELL COUNT: 2 10*3/uL — CL (ref 3.4–10.8)

## 2022-07-23 LAB — MAGNESIUM: MAGNESIUM: 1.7 mg/dL (ref 1.6–2.3)

## 2022-07-23 NOTE — Unmapped (Signed)
[facility] Refill Coordination Note    Specialty Medication(s) to be Shipped:   Transplant: Envarsus 4mg  and Azathioprine 50mg     Other medication(s) to be shipped: No additional medications requested for fill at this time     Jacqulyne Cardosa, DOB: 04/05/1985  Phone: [phone] (home)       All above HIPAA information was verified with patient.     Was a Nurse, learning disability used for this call? No    Completed refill call assessment today to schedule patient's medication shipment from the [facility] [phone]).  All relevant notes have been reviewed.     Specialty medication(s) and dose(s) confirmed: Regimen is correct and unchanged.   Changes to medications: Beonca reports starting the following medications: Escitalopram 5mg   -  Take  1  tab  PO daily   Changes to insurance: No  New side effects reported not previously addressed with a pharmacist or physician: None reported  Questions for the pharmacist: No    Confirmed patient received a Conservation officer, historic buildings and a Surveyor, mining with first shipment. The patient will receive a drug information handout for each medication shipped and additional FDA Medication Guides as required.       DISEASE/MEDICATION-SPECIFIC INFORMATION        N/A    SPECIALTY MEDICATION ADHERENCE     Medication Adherence    Specialty Medication: ENVARSUS XR 4 mg Tb24  Patient is on additional specialty medications: Yes  Additional Specialty Medications: azathioprine 50 mg   Patient is on more than two specialty medications: No  Any gaps in refill history greater than 2 weeks in the last 3 months: no  Demonstrates understanding of importance of adherence: yes              Were doses missed due to medication being on hold? No       Envarsus Xr 4 mg: 2 days of medicine on hand   Azathioprine 50 mg: 2  days of medicine on hand     REFERRAL TO PHARMACIST     Referral to the pharmacist: Not needed      [facility]     Shipping address confirmed in Epic. Patient was notified of new phone menu : Yes    Delivery Scheduled: Yes, Expected medication delivery date: 07/25/22 .     Medication will be delivered via UPS to the prescription address in Epic WAM.    Ricci Barker   [facility] Specialty Technician

## 2022-07-23 NOTE — Progress Notes (Signed)
Virtual Visit via Video Note   I connected with Sandrina K. Erb on 07/23/22 at  9:00 AM EDT by a video enabled telemedicine application and verified that I am speaking with the correct person using two identifiers.   At orientation to the IOP program, Case Manager discussed the limitations of evaluation and management by telemedicine and the availability of in person appointments. The patient expressed understanding and agreed to proceed with virtual visits throughout the duration of the program.   Location:  Patient: [facility] Home Provider: [facility]   History of Present Illness: MDD    Observations/Objective: Check In: Case Manager checked in with all participants to review discharge dates, insurance authorizations, work-related documents and needs from the treatment team regarding medications. Shronda stated needs and engaged in discussion.    Initial Therapeutic Activity: Counselor facilitated a check-in with Vangie to assess for safety, sobriety and medication compliance.  Counselor also inquired about Seleste's current emotional ratings, as well as any significant changes in thoughts, feelings or behavior since previous check in.  Finlee presented for session on time and was alert, oriented x5, with no evidence or self-report of active SI/HI or A/V H.  Ora reported compliance with medication and denied use of alcohol or illicit substances.  Rogena reported scores of 5/10 for depression, 7/10 for anxiety, and 3/10 for anger/irritability.  Joie denied any recent outbursts or panic attacks.  Suhaylah reported that a recent success was taking a nap, picking up her son, and visiting the basketball court yesterday for exercise.  Ashunti reported that she also received news that she has an interview tomorrow.  Sharelle reported that she really wants this job, but is nervous about how she will perform in the interview.  Hadlie reported that her goal today is to work with a friend to do some interview prep  questions so she will feel more confident.       Second Therapeutic Activity: Counselor introduced [employer], [place] Chaplain to provide psychoeducation on topic of Grief and Loss with members today.  [name] began discussion by checking in with the group about their baseline mood today, general thoughts on what grief means to them and how it has affected them personally in the past.  [name] provided information on how the process of grief/loss can differ depending upon one's unique culture, and categories of loss one could experience (i.e. loss of a person, animal, relationship, job, identity, etc).  [name] encouraged members to be mindful of how pervasive loss can be, and how to recognize signs which could indicate that this is having an impact on one's overall mental health and wellbeing.  Intervention effectiveness could not be measured, as client did not participate.    Third Therapeutic Activity: Counselor discussed topic of distress tolerance today with group.  Counselor shared virtual handout with members that offered a DBT approach represented by the acronym ACCEPTS, and outlined strategies for distracting oneself from distressing emotions, allowing appropriate time for these emotions to lesson in intensity and eventually fade away.  Strategies offered included engaging in positive activities, contributing to the wellbeing of others, comparing one's present situation to a previously difficult one to highlight resilience, using mental imagery, and physical grounding.  Counselor assisted members in creating their own realistic ACCEPTS plan for tackling a distressing emotion of their choice.  Intervention was effective, as evidenced by [facility] participating in creation of the plan, choosing embarrassed as her emotion of focus, and identifying several helpful approaches for distraction, such  as dancing with friends, doing word puzzles, asking a friend about their day, reflecting on challenges she has  faced in the past to reinforce resilience, imagining a better outcome for a stressful situation, practicing deep breathing or meditation.   Assessment and Plan: Counselor recommends that Lessie remain in IOP treatment to better manage mental health symptoms, ensure stability and pursue completion of treatment plan goals. Counselor recommends adherence to crisis/safety plan, taking medications as prescribed, and following up with medical professionals if any issues arise.    Follow Up Instructions: Counselor will send Webex link for session tomorrow.  Lucian was advised to call back or seek an in-person evaluation if the symptoms worsen or if the condition fails to improve as anticipated.   Collaboration of Care:   Medication Management AEB Dr. Eliseo Gum or Hillery Jacks, NP                                          Case Manager AEB Jeri Modena, CNA   Patient/Guardian was advised Release of Information must be obtained prior to any record release in order to collaborate their care with an outside provider. Patient/Guardian was advised if they have not already done so to contact the registration department to sign all necessary forms in order for [place] to release information regarding their care.   Consent: Patient/Guardian gives verbal consent for treatment and assignment of benefits for services provided during this visit. Patient/Guardian expressed understanding and agreed to proceed.  I provided 180 minutes of non-face-to-face time during this encounter.   Noralee Stain, [place], LCAS 07/23/22

## 2022-07-24 ENCOUNTER — Other Ambulatory Visit (HOSPITAL_COMMUNITY): Payer: Medicare Other | Admitting: Psychiatry

## 2022-07-24 DIAGNOSIS — F321 Major depressive disorder, single episode, moderate: Secondary | ICD-10-CM

## 2022-07-24 DIAGNOSIS — F331 Major depressive disorder, recurrent, moderate: Secondary | ICD-10-CM | POA: Diagnosis not present

## 2022-07-24 MED FILL — AZATHIOPRINE 50 MG TABLET: ORAL | 30 days supply | Qty: 60 | Fill #6

## 2022-07-24 NOTE — Progress Notes (Signed)
Virtual Visit via Video Note   I connected with Christine Reed on 07/24/22 at  9:00 AM EDT by a video enabled telemedicine application and verified that I am speaking with the correct person using two identifiers.   At orientation to the IOP program, Case Manager discussed the limitations of evaluation and management by telemedicine and the availability of in person appointments. The patient expressed understanding and agreed to proceed with virtual visits throughout the duration of the program.   Location:  Patient: Patient Home Provider: [facility]   History of Present Illness: MDD    Observations/Objective: Check In: Case Manager checked in with all participants to review discharge dates, insurance authorizations, work-related documents and needs from the treatment team regarding medications. Christine Reed stated needs and engaged in discussion.    Initial Therapeutic Activity: Counselor facilitated a check-in with Christine Reed to assess for safety, sobriety and medication compliance.  Counselor also inquired about Christine Reed's current emotional ratings, as well as any significant changes in thoughts, feelings or behavior since previous check in.  Christine Reed presented for session on time and was alert, oriented x5, with no evidence or self-report of active SI/HI or A/V H.  Christine Reed reported compliance with medication and denied use of alcohol or illicit substances.  Christine Reed reported scores of 7/10 for depression, 8/10 for anxiety, and 0/10 for anger/irritability.  Christine Reed denied any recent outbursts or panic attacks.  Christine Reed reported that a recent success was staying productive yesterday by going to a cookout with a friend's family at a park and running errands.  Christine Reed reported that a current struggle is worrying about her upcoming interview later today.  Christine Reed reported that her goal today is to do more interview preparation before her interview later so she will feel more confident.         Second Therapeutic  Activity: Counselor introduced topic of anger management today.  Counselor virtually shared a handout with members on this subject featuring a variety of coping skills, and facilitated discussion on these approaches.  Examples included raising awareness of anger triggers, practicing deep breathing, keeping an anger log to better understand episodes, using diversion activities to distract oneself for 30 minutes, taking a time out when necessary, and being mindful of warning signs tied to thoughts or behavior.  Counselor inquired about which techniques group members have used before, what has proved to be helpful, what their unique warning signs might be, as well as what they will try out in the future to assist with de-escalation.  Intervention was effective, as evidenced by Christine Reed participating in discussion on activity, and reporting that anger has been a growing problem for her due to numerous stressors related to work and relationships.  Christine Reed reported that one of her most significant triggers is dealing with financial worries.  Christine Reed reported that warning signs include flushed skin, increased body temperature, muscle tension, and sharper tone of voice.  Christine Reed requested to leave session early at 10:50am in order to attend her job interview on time.  Counselor allowed this, and informed care team of her early departure.    Assessment and Plan: Counselor recommends that Christine Reed remain in IOP treatment to better manage mental health symptoms, ensure stability and pursue completion of treatment plan goals. Counselor recommends adherence to crisis/safety plan, taking medications as prescribed, and following up with medical professionals if any issues arise.    Follow Up Instructions: Counselor will send Webex link for session tomorrow.  Christine Reed was advised to call back or seek  an in-person evaluation if the symptoms worsen or if the condition fails to improve as anticipated.   Collaboration of Care:   Medication  Management AEB Dr. Eliseo Gum or Hillery Jacks, NP                                          Case Manager AEB Jeri Modena, CNA   Patient/Guardian was advised Release of Information must be obtained prior to any record release in order to collaborate their care with an outside provider. Patient/Guardian was advised if they have not already done so to contact the registration department to sign all necessary forms in order for [place] to release information regarding their care.   Consent: Patient/Guardian gives verbal consent for treatment and assignment of benefits for services provided during this visit. Patient/Guardian expressed understanding and agreed to proceed.  I provided 110 minutes of non-face-to-face time during this encounter.   Christine Stain, LCSW, LCAS 07/24/22

## 2022-07-25 ENCOUNTER — Other Ambulatory Visit (HOSPITAL_COMMUNITY): Payer: Medicare Other | Admitting: Licensed Clinical Social Worker

## 2022-07-25 DIAGNOSIS — F321 Major depressive disorder, single episode, moderate: Secondary | ICD-10-CM

## 2022-07-25 DIAGNOSIS — F331 Major depressive disorder, recurrent, moderate: Secondary | ICD-10-CM | POA: Diagnosis not present

## 2022-07-25 LAB — TACROLIMUS LEVEL: TACROLIMUS BLOOD: 7.4 ng/mL (ref 2.0–20.0)

## 2022-07-25 NOTE — Progress Notes (Signed)
Virtual Visit via Video Note   I connected with Christine Reed on 07/25/22 at  9:00 AM EDT by a video enabled telemedicine application and verified that I am speaking with the correct person using two identifiers.   At orientation to the IOP program, Case Manager discussed the limitations of evaluation and management by telemedicine and the availability of in person appointments. The patient expressed understanding and agreed to proceed with virtual visits throughout the duration of the program.   Location:  Patient: Patient Home Provider: [facility]   History of Present Illness: MDD    Observations/Objective: Check In: Case Manager checked in with all participants to review discharge dates, insurance authorizations, work-related documents and needs from the treatment team regarding medications. Christine Reed stated needs and engaged in discussion.    Initial Therapeutic Activity: Counselor facilitated a check-in with Christine Reed to assess for safety, sobriety and medication compliance.  Counselor also inquired about Christine Reed's current emotional ratings, as well as any significant changes in thoughts, feelings or behavior since previous check in.  Christine Reed presented for session on time and was alert, oriented x5, with no evidence or self-report of active SI/HI or A/V H.  Christine Reed reported compliance with medication and denied use of alcohol or illicit substances.  Christine Reed reported scores of 6/10 for depression, 7/10 for anxiety, and 8/10 for irritability.  Christine Reed denied any recent panic attacks.  Christine Reed reported that a recent success was having a good interview yesterday, stating "It went great".  Christine Reed reported that a recent struggle was having an outburst yesterday when she tried to check her son's blood pressure and he wasn't cooperating.  Christine Reed reported that her goal today is to take time to rest after group.       Second Therapeutic Activity: Counselor covered topic of core beliefs with group today.  Counselor  virtually shared a handout on the subject, which explained how everyone looks at the world differently, and two people can have the same experience, but have different interpretations of what happened.  Members were encouraged to think of these like sunglasses with different "shades" influencing perception towards positive or negative outcomes.  Examples of negative core beliefs were provided, such as "I'm unlovable", "I'm not good enough", and "I'm a bad person".  Members were asked to share which one(s) they could relate to, and then identify evidence which contradicts these beliefs.  Counselor also provided psychoeducation on positive affirmations today.  Counselor explained how these are positive statements which can be spoken out loud or recited mentally to challenge negative thoughts and/or core beliefs to improve mood and outlook each day.  Counselor shared a comprehensive list of affirmations virtually to members with different categories, including ones for health, confidence, success, and happiness.  Counselor invited members to look through this list and identify any which resonated with them, and practice saying them out loud with sincerity.  Intervention was effective, as evidenced by Christine Reed successfully participating in discussion on the subject and reporting that she could relate to several negative core beliefs listed on the handout, such as "I'm not worthy", "I am unlovable", and "I will end up alone".  Christine Reed was able to successfully challenge the core belief "I am weak" by listing evidence that contradicted it, including the fact that she was able to make it through dialysis, raise and autistic son, and get out of a toxic relationships.  Christine Reed also reported that she liked several of the positive affirmations listed, such as "I embrace my flaws  because I know nobody is perfect", "I get better every day in every way", "Failure is great feedback", and "I am who I want to be starting right now".     Assessment and Plan: Counselor recommends that Christine Reed remain in IOP treatment to better manage mental health symptoms, ensure stability and pursue completion of treatment plan goals. Counselor recommends adherence to crisis/safety plan, taking medications as prescribed, and following up with medical professionals if any issues arise.    Follow Up Instructions: Counselor will send Webex link for session tomorrow.  Christine Reed was advised to call back or seek an in-person evaluation if the symptoms worsen or if the condition fails to improve as anticipated.   Collaboration of Care:   Medication Management AEB Dr. Eliseo Gum or Hillery Jacks, NP                                          Case Manager AEB Jeri Modena, CNA   Patient/Guardian was advised Release of Information must be obtained prior to any record release in order to collaborate their care with an outside provider. Patient/Guardian was advised if they have not already done so to contact the registration department to sign all necessary forms in order for [place] to release information regarding their care.   Consent: Patient/Guardian gives verbal consent for treatment and assignment of benefits for services provided during this visit. Patient/Guardian expressed understanding and agreed to proceed.  I provided 180 minutes of non-face-to-face time during this encounter.   Noralee Stain, [place], LCAS 07/25/22

## 2022-07-26 ENCOUNTER — Other Ambulatory Visit (HOSPITAL_COMMUNITY): Payer: Medicare Other | Admitting: Licensed Clinical Social Worker

## 2022-07-26 DIAGNOSIS — F321 Major depressive disorder, single episode, moderate: Secondary | ICD-10-CM

## 2022-07-26 DIAGNOSIS — F331 Major depressive disorder, recurrent, moderate: Secondary | ICD-10-CM | POA: Diagnosis not present

## 2022-07-29 ENCOUNTER — Other Ambulatory Visit (HOSPITAL_COMMUNITY): Payer: Medicare Other | Admitting: Licensed Clinical Social Worker

## 2022-07-29 DIAGNOSIS — F331 Major depressive disorder, recurrent, moderate: Secondary | ICD-10-CM | POA: Diagnosis not present

## 2022-07-29 DIAGNOSIS — F321 Major depressive disorder, single episode, moderate: Secondary | ICD-10-CM

## 2022-07-29 NOTE — Progress Notes (Signed)
Virtual Visit via Video Note   I connected with Christine Reed on 07/29/22 at  9:00 AM EDT by a video enabled telemedicine application and verified that I am speaking with the correct person using two identifiers.   At orientation to the IOP program, Case Manager discussed the limitations of evaluation and management by telemedicine and the availability of in person appointments. The patient expressed understanding and agreed to proceed with virtual visits throughout the duration of the program.   Location:  Patient: Patient Home Provider: [facility] Office   History of Present Illness: MDD    Observations/Objective: Check In: Case Manager checked in with all participants to review discharge dates, insurance authorizations, work-related documents and needs from the treatment team regarding medications. Christine Reed stated needs and engaged in discussion.    Initial Therapeutic Activity: Counselor facilitated a check-in with Christine Reed to assess for safety, sobriety and medication compliance.  Counselor also inquired about Christine Reed's current emotional ratings, as well as any significant changes in thoughts, feelings or behavior since previous check in.  Christine Reed presented for session on time and was alert, oriented x5, with no evidence or self-report of active SI/HI or A/V H.  Christine Reed reported compliance with medication and denied use of alcohol or illicit substances.  Christine Reed reported scores of 7/10 for depression, 7/10 for anxiety, and 0/10 for anger/irritability.  Christine Reed denied any recent outbursts or panic attacks.  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  She reported that this also gave her time to catch up with a friend, who she is now dating, stating "That's good".  Christine Reed reported that one struggle was being on her feet a lot, which was tiring physically.  Christine Reed reported that her goal today is to look into finding a therapist to ensure support after she  completes MHIOP.       Second Therapeutic Activity: Counselor utilized a Cabin crew with group members today to guide discussion on topic of codependency.  This handout defined codependency as excessive emotional or psychological reliance upon someone who requires support on account of an illness or addiction.  It also explained how this issue presents in dysfunctional family systems, including behavior such as denying existence of problems, rigid boundaries on communication, strained trust, lack of individuality, and reinforcement of unhealthy coping mechanisms such as substance use.  Characteristics of co-dependent people were listed for assistance with identification, such as extreme need for approval/recognition, difficulty identifying feelings, poor communication, and more.  Members were also tasked with completing a questionnaire in order to identify signs of codependency and results were discussed afterward.  This handout also offered strategies for resolving co-dependency within one's network, including increased use of assertive communication skills in order to set appropriate boundaries.  Intervention was effective, as evidenced by Christine Reed actively participating in discussion on the subject, and completing codependency questionnaire, with 15 out of 20 positive responses.  Christine Reed reported that she grew up in a dysfunctional family that rarely talked about issues such as mental health, stating "It was almost taboo to talk about these things.  The relationship with my son's father, I was very codependent on him.  It didn't matter what he said or did, I didn't want to be alone.  I felt like I needed him".  She reported that her goal will be to prioritize healthier relationships in her life as she continues with therapy, and start by communicate directly to the person she has problems  with instead of going to someone else, practice "I" statements to express feelings more openly and clearly, and follow  through on boundaries she sets with close supports like her son, who can be a major stressor.    Assessment and Plan: Counselor recommends that Christine Reed remain in IOP treatment to better manage mental health symptoms, ensure stability and pursue completion of treatment plan goals. Counselor recommends adherence to crisis/safety plan, taking medications as prescribed, and following up with medical professionals if any issues arise.    Follow Up Instructions: Counselor will send Webex link for session tomorrow.  Christine Reed was advised to call back or seek an in-person evaluation if the symptoms worsen or if the condition fails to improve as anticipated.   Collaboration of Care:   Medication Management AEB Dr. Eliseo Gum or Hillery Jacks, NP                                          Case Manager AEB Jeri Modena, CNA   Patient/Guardian was advised Release of Information must be obtained prior to any record release in order to collaborate their care with an outside provider. Patient/Guardian was advised if they have not already done so to contact the registration department to sign all necessary forms in order for [place] to release information regarding their care.   Consent: Patient/Guardian gives verbal consent for treatment and assignment of benefits for services provided during this visit. Patient/Guardian expressed understanding and agreed to proceed.  I provided 180 minutes of non-face-to-face time during this encounter.   Noralee Stain, [place], LCAS 07/29/22

## 2022-07-29 NOTE — Progress Notes (Signed)
Virtual Visit via Telephone Note  I connected with Christine Reed on 07/29/22 at  9:00 AM EDT by telephone and verified that I am speaking with the correct person using two identifiers.  Location: Patient: Home Provider: Office   I discussed the limitations, risks, security and privacy concerns of performing an evaluation and management service by telephone and the availability of in person appointments. I also discussed with the patient that there may be a patient responsible charge related to this service. The patient expressed understanding and agreed to proceed.      I discussed the assessment and treatment plan with the patient. The patient was provided an opportunity to ask questions and all were answered. The patient agreed with the plan and demonstrated an understanding of the instructions.   The patient was advised to call back or seek an in-person evaluation if the symptoms worsen or if the condition fails to improve as anticipated.     Christine Morton, MD  [facility] MD/PA/NP OP Progress Note  07/29/2022 11:46 AM Christine Reed  MRN:  [id number]  Chief Complaint:  Chief Complaint  Patient presents with   Depression   HPI: Christine Reed is a 37 year old African-American female who presents after referral from the [facility] urgent care facility. Admitted to IOP 07/09/2022.   Patient reports that she is doing ok, with the Lexapro. Patient reports that she still feels drowsy, and is still sleeping 14-15 hrs. Patient denies muscle aches or pains. She is still going to the gym or exercising at least daily. Patient reports that she is eating 1 meal/ day. Patient is also taking care of her 6yo son, and is showing signs of T1DM, and is going to be evaluated. Patient reports that she normally works, and it was very stressful. Patient reports that her mood remains low, she feels worthless a lot. Patient reports that she is struggling with low motivation and low appetite. Patient denies SI,  HI, and AVH. Patient reports that being outside in the son, spending time with others, she feels better. Patient denies feeling nervous or keyed up.   Patient denies purging behaviors. Patient denies seizure hx. Patient denies Etoh use. Patient is feeling very tired and struggling to stay awake currently.   Patient endorses having a support system in her mother, uncle, aunts, and cousins.   07/2022 labs cited by patient  Cr 1.96, per patient K+ 4.8, per patient  Na: 142, per patient WBC: 2.0 Visit Diagnosis:    ICD-10-CM   1. 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 difficile diarrhea    Chronic kidney disease    Pt is on dialysis   Depression    Diabetes mellitus    diagnosed at age 86; Type 1   Diabetic retinopathy (HCC)    GERD (gastroesophageal reflux disease)    Headache    Hypertension    Sickle cell trait (HCC)     Past Surgical History:  Procedure Laterality Date   AV FISTULA PLACEMENT Right 10/10/2016   Procedure: CREATION OF RIGHT ARM RADIOCEPHALIC ARTERIOVENOUS (AV) FISTULA;  Surgeon: Nada Libman, MD;  Location: [facility] OR;  Service: Vascular;  Laterality: Right;   CESAREAN SECTION N/A 10/19/2015   Procedure: CESAREAN SECTION;  Surgeon: Hermina Staggers, MD;  Location: [facility] BIRTHING SU[facility];  Service: Obstetrics;  Laterality: N/A;   KIDNEY TRANSPLANT     lipo suction  2015   TONSILLECTOMY     TONSILLECTOMY AND ADENOIDECTOMY      Family Psychiatric History: unknown at this time   Family History:  Family History  Problem Relation Age of Onset   Diabetes Mother    Hypertension Mother    Alcohol abuse Father    Diabetes Father    Hypertension Father    Sickle cell trait Father    Alcohol abuse Paternal Aunt    Alcohol abuse  Paternal Uncle    Diabetes Maternal Grandfather    Hypertension Maternal Grandfather    Cancer Maternal Grandmother        colon and breast   Hypertension Maternal Grandmother    Breast cancer Maternal Grandmother    Alcohol abuse Paternal Grandfather    Diabetes Paternal Grandfather    Hypertension Paternal Grandfather    Diabetes Paternal Grandmother    Hypertension Paternal Grandmother     Social History:  Social History   Socioeconomic History   Marital status: Single    Spouse name: Not on file   Number of children: 1   Years of education: some college   Highest education level: Not on file  Occupational History   Occupation: Disabled  Tobacco Use   Smoking status: Never   Smokeless tobacco: Never  Vaping Use   Vaping Use: Never used  Substance and Sexual Activity   Alcohol use: Yes    Comment: very rarely   Drug use: No   Sexual activity: Not Currently    Birth control/protection: None  Other Topics Concern   Not on file  Social History Narrative   Lives at home with mother.   Right-handed.   4-5 cups caffeine per week.   Social Determinants of Health   Financial Resource Strain: Not on file  Food Insecurity: Not on file  Transportation Needs: Not on file  Physical Activity: Not on file  Stress: Not on file  Social Connections: Not on file    Allergies:  Allergies  Allergen Reactions   Ivp Dye [Iodinated Contrast Media] Other (See Comments)    Burning    Propranolol Hcl Er Swelling   Iodine Rash   Nickel Rash    Metabolic Disorder Labs: Lab Results  Component Value Date   HGBA1C 8.5 (H) 01/06/2019   MPG 197.25 01/06/2019   MPG 131 07/17/2015   No results found for: "PROLACTIN" Lab Results  Component Value Date   CHOL 143 09/09/2018   TRIG 122.0 09/09/2018   HDL 66.90 09/09/2018   CHOLHDL 2 09/09/2018   VLDL 24.4 09/09/2018   LDLCALC 52 09/09/2018   LDLCALC 103 (H) 09/04/2016   Lab Results  Component Value Date   TSH 1.67  01/29/2019   TSH 2.444 01/06/2019    Therapeutic Level Labs: No results found for: "LITHIUM" No results found for: "VALPROATE" No results found for: "CBMZ"  Current Medications: Current Outpatient Medications  Medication Sig Dispense Refill   amLODipine (NORVASC) 10 MG tablet Take 1 tablet (10 mg total) by mouth daily. 30 tablet 0   aspirin EC 81 MG tablet Take 81 mg by mouth daily. Swallow whole.     azaTHIOprine (IMURAN) 50 MG tablet Take 50 mg by mouth in the morning and at bedtime.     Biotin 1000 MCG CHEW Chew by mouth.     escitalopram (LEXAPRO)  5 MG tablet Take 1 tablet (5 mg total) by mouth daily. 30 tablet 0   FLUoxetine (PROZAC) 10 MG capsule Take 1 capsule (10 mg total) by mouth daily. 30 capsule 2   glucagon (GLUCAGON EMERGENCY) 1 MG injection Inject 1 mg into the skin once as needed for up to 1 dose. Inject 1mg  into skin once as needed for low blood sugar. 1 each 12   insulin aspart (NOVOLOG) 100 UNIT/ML injection Inject 0-20 Units into the skin 3 (three) times daily before meals. Sliding scale 10 mL 3   Insulin Detemir (LEVEMIR FLEXTOUCH) 100 UNIT/ML Pen INJECT 10 UNITS INTO THE SKIN IN THE MORNING AND INJECT 11 UNITS INTO THE SKIN IN THE EVENING. (Patient taking differently: Inject 10-11 Units into the skin See admin instructions. Inject 10 units in the morning and 11 units in the evening.) 3 pen 3   loperamide (IMODIUM) 2 MG capsule Take 1 capsule (2 mg total) by mouth 4 (four) times daily as needed for diarrhea or loose stools. 25 capsule 0   losartan (COZAAR) 50 MG tablet Take 50 mg by mouth at bedtime.     metoprolol succinate (TOPROL-XL) 50 MG 24 hr tablet Take 50 mg by mouth daily.     metroNIDAZOLE (FLAGYL) 500 MG tablet Take 500 mg by mouth 2 (two) times daily.     ondansetron (ZOFRAN) 4 MG tablet Take 1 tablet (4 mg total) by mouth every 6 (six) hours. 12 tablet 0   promethazine-dextromethorphan (PROMETHAZINE-DM) 6.25-15 MG/5ML syrup Take 5 mLs by mouth 4 (four)  times daily as needed for cough. 118 mL 0   sevelamer (RENAGEL) 800 MG tablet Take 2,400 mg by mouth See admin instructions. Take 2400mg  three times daily with each meal and snacks  12   sodium bicarbonate 650 MG tablet Take 1,300 mg by mouth 3 (three) times daily.     Tacrolimus ER (ENVARSUS XR) 4 MG TB24 Take 16 mg by mouth.     No current facility-administered medications for this visit.      Psychiatric Specialty Exam: Review of Systems  Psychiatric/Behavioral:  Positive for dysphoric mood. Negative for hallucinations, sleep disturbance and suicidal ideas. The patient is not nervous/anxious.     unknown if currently breastfeeding.There is no height or weight on file to calculate BMI.  General Appearance: NA  Eye Contact:  NA  Speech:  Clear and Coherent  Volume:  Normal  Mood:  Dysphoric  Affect:  NA  Thought Process:  Coherent  Orientation:  Full (Time, Place, and Person)  Thought Content: Logical   Suicidal Thoughts:  No  Homicidal Thoughts:  No  Memory:  Immediate;   Good Recent;   Good  Judgement:  Good  Insight:  Good  Psychomotor Activity:   endorses decreased  Concentration:  Concentration: Good  Recall:  Good  Fund of Knowledge: Good  Language: Good  Akathisia:  No  Handed:    AIMS (if indicated): not done  Assets:  Communication Skills Desire for Improvement Housing Leisure Time Resilience Social Support  ADL's:  Intact  Cognition: WNL  Sleep:   excessive   Screenings: GAD-7    Flowsheet Row Postpartum Visit from 11/30/2015 in [facility] from 10/27/2015 in Center for [facility] Routine Prenatal from 10/17/2015 in Center for [facility] Documentation from 08/28/2015 in Center for [facility]  Total GAD-7 Score 12 13 12 6       [id number]  Flowsheet Row Counselor from 07/04/2022 in BEHAVIORAL HEALTH INTENSIVE PSYCH Office Visit from 01/29/2019  in [facility] [place] Postpartum Visit from 11/30/2015 in [facility] from 10/27/2015 in [facility] for [facility] Routine Prenatal from 10/17/2015 in Center for [facility]  PHQ-2 Total Score 6 0 0 2 3  PHQ-9 Total Score 26 0 8 7 14       Flowsheet Row Counselor from 07/04/2022 in BEHAVIORAL HEALTH INTENSIVE [facility] ED from 07/02/2022 in [facility] ED from 05/03/2022 in [facility] Emergency Department at [facility]  C-SSRS RISK CATEGORY Error: Question 6 not populated No Risk No Risk        Assessment and Plan: Upon further review, the patient's labs are found in Care Everywhere and indicated that patient has a WBC of approximately 2.  Provider did let patient know that this is abnormal, patient's white blood cell count has been trending downward, but has not been this low before.  Did suggest that patient hold on going to the gym until her transplant provider addresses this and if she gets clearance for them she is able to restart.  At this time do not think that patient's excessive tiredness is due to SSRI, given severe leukopenia.  Patient did endorse that she is currently getting bone marrow transfusions?  To address leukopenia.  We will hold off on changing medications at this time given need to medically rule out causes.  Did consider switching to Wellbutrin briefly, but will hold. MDD, recurrent, moderate Anxiety, unspecified - Continue Lexapro 5 mg daily  Leukopenia post transplant  Collaboration of Care: Collaboration of Care:   Patient/Guardian was advised Release of Information must be obtained prior to any record release in order to collaborate their care with an outside provider. Patient/Guardian was advised if they have not already done so to contact the registration department to sign all necessary forms in order for [place] to release  information regarding their care.   Consent: Patient/Guardian gives verbal consent for treatment and assignment of benefits for services provided during this visit. Patient/Guardian expressed understanding and agreed to proceed.   PGY-3 Christine Morton, MD 07/29/2022, 11:46 AM

## 2022-07-30 ENCOUNTER — Other Ambulatory Visit (HOSPITAL_COMMUNITY): Payer: Medicare Other | Admitting: Psychiatry

## 2022-07-30 DIAGNOSIS — F331 Major depressive disorder, recurrent, moderate: Secondary | ICD-10-CM | POA: Diagnosis not present

## 2022-07-30 DIAGNOSIS — F321 Major depressive disorder, single episode, moderate: Secondary | ICD-10-CM

## 2022-07-30 NOTE — Progress Notes (Signed)
Virtual Visit via Video Note   I connected with Christine Reed on 07/30/22 at  9:00 AM EDT by a video enabled telemedicine application and verified that I am speaking with the correct person using two identifiers.   At orientation to the IOP program, Case Manager discussed the limitations of evaluation and management by telemedicine and the availability of in person appointments. The patient expressed understanding and agreed to proceed with virtual visits throughout the duration of the program.   Location:  Patient: Patient Home Provider: [facility]   History of Present Illness: MDD    Observations/Objective: Check In: Case Manager checked in with all participants to review discharge dates, insurance authorizations, work-related documents and needs from the treatment team regarding medications. Christine Reed stated needs and engaged in discussion.    Initial Therapeutic Activity: Counselor facilitated a check-in with Christine Reed to assess for safety, sobriety and medication compliance.  Counselor also inquired about Christine Reed's current emotional ratings, as well as any significant changes in thoughts, feelings or behavior since previous check in.  Christine Reed presented for session on time and was alert, oriented x5, with no evidence or self-report of active SI/HI or A/V H.  Christine Reed reported compliance with medication and denied use of alcohol or illicit substances.  Christine Reed reported scores of 4/10 for depression, 3/10 for anxiety, and 3/10 for anger/irritability.  Christine Reed denied any recent outbursts or panic attacks.  Christine Reed reported that a recent success was getting a lot of rest yesterday and then ordering a pizza for dinner.  Christine Reed denied any new struggles.  Christine Reed reported that her goal today is to attend dance practice later today.       Second Therapeutic Activity: Counselor offered to teach group members an ACT relaxation technique today to aid in managing difficult thoughts, feelings, urges, and sensations.  Counselor  guided members through process of getting comfortable, achieving relaxing breathing rhythm, and then maintaining this throughout activity.  Counselor invited members to imagine a gently flowing stream in their mind with leaves floating upon it, and when any thoughts, feelings, urges, or sensations arose, good or bad, they were instructed to visualize placing them on these passing leaves over course of 15 minutes practice.  Intervention was effective, as evidenced by Christine Reed successfully participating in activity and reporting that she enjoyed it and found it helpful for grounding her despite facing some initial anxiety.  She stated "This didn't feel too bad.  I plan to add it on to arsenal of mediation skills learned from the group".   Third Therapeutic Activity: Counselor introduced [employer], [place] Chaplain to provide psychoeducation on topic of Grief and Loss with members today.  Christine Reed began discussion by checking in with the group about their baseline mood today, general thoughts on what grief means to them and how it has affected them personally in the past.  Christine Reed provided information on how the process of grief/loss can differ depending upon one's unique culture, and categories of loss one could experience (i.e. loss of a person, animal, relationship, job, identity, etc).  Christine Reed encouraged members to be mindful of how pervasive loss can be, and how to recognize signs which could indicate that this is having an impact on one's overall mental health and wellbeing.  Intervention was effective, as evidenced by [facility] participating in discussion with speaker on the subject, reporting that a loss she could relate to on the subject of systemic loss was implementation of changes at work which no one was supportive of,  and ended up creating more stress as a result.  [name] stated "That was frustrating for everybody".    Assessment and Plan: Counselor recommends that Christine Reed remain in IOP treatment to better  manage mental health symptoms, ensure stability and pursue completion of treatment plan goals. Counselor recommends adherence to crisis/safety plan, taking medications as prescribed, and following up with medical professionals if any issues arise.    Follow Up Instructions: Counselor will send Webex link for session tomorrow.  Christine Reed was advised to call back or seek an in-person evaluation if the symptoms worsen or if the condition fails to improve as anticipated.   Collaboration of Care:   Medication Management AEB Dr. Eliseo Gum or Hillery Jacks, NP                                          Case Manager AEB Jeri Modena, CNA   Patient/Guardian was advised Release of Information must be obtained prior to any record release in order to collaborate their care with an outside provider. Patient/Guardian was advised if they have not already done so to contact the registration department to sign all necessary forms in order for [place] to release information regarding their care.   Consent: Patient/Guardian gives verbal consent for treatment and assignment of benefits for services provided during this visit. Patient/Guardian expressed understanding and agreed to proceed.  I provided 180 minutes of non-face-to-face time during this encounter.   Noralee Stain, LCSW, LCAS 07/30/22

## 2022-07-31 ENCOUNTER — Telehealth (HOSPITAL_COMMUNITY): Payer: Self-pay | Admitting: Psychiatry

## 2022-07-31 ENCOUNTER — Other Ambulatory Visit (HOSPITAL_COMMUNITY): Payer: Medicare Other | Admitting: Licensed Clinical Social Worker

## 2022-07-31 DIAGNOSIS — F321 Major depressive disorder, single episode, moderate: Secondary | ICD-10-CM

## 2022-07-31 DIAGNOSIS — F331 Major depressive disorder, recurrent, moderate: Secondary | ICD-10-CM | POA: Diagnosis not present

## 2022-07-31 NOTE — Telephone Encounter (Signed)
D:  Placed call on Dr. Wannetta Sender behalf to express concerns about pt's very low blood count.  According to pt, she has been struggling with a very low blood count even before the transplant.  Pt reports she will be seeing the transplant team tomorrow and they will check her level again.  Pt will be excused tomorrow and 08-02-22 d/t appointments.  A:  Provided pt with support.  Encouraged pt to take precautions being around other people and to rest.  Inform treatment team.  R:  Pt receptive.

## 2022-07-31 NOTE — Progress Notes (Signed)
Virtual Visit via Video Note   I connected with Christine Reed on 07/31/22 at  9:00 AM EDT by a video enabled telemedicine application and verified that I am speaking with the correct person using two identifiers.   At orientation to the IOP program, Case Manager discussed the limitations of evaluation and management by telemedicine and the availability of in person appointments. The patient expressed understanding and agreed to proceed with virtual visits throughout the duration of the program.   Location:  Patient: Patient Home Provider: [facility]   History of Present Illness: MDD    Observations/Objective: Check In: Case Manager checked in with all participants to review discharge dates, insurance authorizations, work-related documents and needs from the treatment team regarding medications. Christine Reed stated needs and engaged in discussion.    Initial Therapeutic Activity: Counselor facilitated a check-in with Christine Reed to assess for safety, sobriety and medication compliance.  Counselor also inquired about Christine Reed's current emotional ratings, as well as any significant changes in thoughts, feelings or behavior since previous check in.  Christine Reed presented for session on time and was alert, oriented x5, with no evidence or self-report of active SI/HI or A/V H.  Christine Reed reported compliance with medication and denied use of alcohol or illicit substances.  Christine Reed reported scores of 6/10 for depression, 3/10 for anxiety, and 2/10 for anger/irritability.  Christine Reed denied any recent outbursts or panic attacks.  Christine Reed reported that a recent success was going to dance rehearsal yesterday.  Christine Reed reported that an ongoing struggle is having erratic sleep patterns, where she can sleep as much as 12 hours.  Christine Reed reported that her goal today is to take a walk after group to get some exercise.       Second Therapeutic Activity: Counselor introduced Christine Reed, [employer] Pharmacist, to provide psychoeducation on topic of  medication compliance with members today.  Michelle Nasuti provided psychoeducation on classes of medications such as antidepressants, antipsychotics, what symptoms they are intended to treat, and any side effects one might encounter while on a particular prescription.  Time was allowed for clients to ask any questions they might have of [facility] regarding this specialty.  Intervention was effective, as evidenced by [facility] participating in discussion with speaker on the subject, reporting that this helped provide encouragement to continue taking medication regularly, as she had issues with compliance in the past, and didn't always give them the chance to begin working.  She stated "I don't feel anything yet, but I know I need to give it time".    Third Therapeutic Activity: Psycho-educational portion of group was co-facilitated by wellness director (David Stall, MS, MPH, [facility]) focused on self-care in daily life. Facilitator and group members discussed presented materials regarding importance of sleep, diet, and exercise. Group members discussed any changes they are willing to make to improve an area of self-care in their lives (physical, psychological, emotional, spiritual, relationship, professional) to improve overall mental health as they continue with treatment.  Intervention was effective, as evidenced by [facility] participating in discussion with speaker on the subject, reporting that she has been trying to include more physical activity in her daily routine to improve overall wellness. Christine Reed reported that she used to go to the gym early in the morning and achieve a 'runners high', which would energize her for day ahead.  She stated "It just made me feel good to be moving".      Assessment and Plan: Counselor recommends that [name] remain in IOP treatment to better manage mental  health symptoms, ensure stability and pursue completion of treatment plan goals. Counselor recommends adherence to crisis/safety plan, taking  medications as prescribed, and following up with medical professionals if any issues arise.    Follow Up Instructions: Counselor will send Webex link for session tomorrow.  [name] was advised to call back or seek an in-person evaluation if the symptoms worsen or if the condition fails to improve as anticipated.   Collaboration of Care:   Medication Management AEB Dr. Eliseo Gum or Hillery Jacks, NP                                          Case Manager AEB Jeri Modena, CNA   Patient/Guardian was advised Release of Information must be obtained prior to any record release in order to collaborate their care with an outside provider. Patient/Guardian was advised if they have not already done so to contact the registration department to sign all necessary forms in order for [place] to release information regarding their care.   Consent: Patient/Guardian gives verbal consent for treatment and assignment of benefits for services provided during this visit. Patient/Guardian expressed understanding and agreed to proceed.  I provided 180 minutes of non-face-to-face time during this encounter.   Noralee Stain, [place], LCAS 07/31/22

## 2022-08-01 ENCOUNTER — Other Ambulatory Visit (HOSPITAL_COMMUNITY): Payer: Medicare Other

## 2022-08-01 ENCOUNTER — Ambulatory Visit: Admit: 2022-08-01 | Discharge: 2022-08-02 | Payer: MEDICARE

## 2022-08-01 ENCOUNTER — Ambulatory Visit: Admit: 2022-08-01 | Discharge: 2022-08-02 | Payer: MEDICARE | Attending: Nephrology | Primary: Nephrology

## 2022-08-01 ENCOUNTER — Telehealth: Admit: 2022-08-01 | Discharge: 2022-08-02 | Payer: MEDICARE | Attending: Clinical | Primary: Clinical

## 2022-08-01 ENCOUNTER — Encounter: Admit: 2022-08-01 | Discharge: 2022-08-02 | Payer: MEDICARE | Attending: Nephrology | Primary: Nephrology

## 2022-08-01 DIAGNOSIS — Z9483 Pancreas transplant status: Principal | ICD-10-CM

## 2022-08-01 DIAGNOSIS — D631 Anemia in chronic kidney disease: Principal | ICD-10-CM

## 2022-08-01 DIAGNOSIS — Z940 Kidney transplant status: Principal | ICD-10-CM

## 2022-08-01 DIAGNOSIS — Z79899 Other long term (current) drug therapy: Principal | ICD-10-CM

## 2022-08-01 DIAGNOSIS — D708 Other neutropenia: Principal | ICD-10-CM

## 2022-08-01 DIAGNOSIS — F332 Major depressive disorder, recurrent severe without psychotic features: Principal | ICD-10-CM

## 2022-08-01 DIAGNOSIS — N1832 Anemia in stage 3b chronic kidney disease (CMS-HCC): Principal | ICD-10-CM

## 2022-08-01 DIAGNOSIS — Z1159 Encounter for screening for other viral diseases: Principal | ICD-10-CM

## 2022-08-01 DIAGNOSIS — D84821 Immunocompromised state due to drug therapy (CMS-HCC): Principal | ICD-10-CM

## 2022-08-01 DIAGNOSIS — E119 Type 2 diabetes mellitus without complications: Principal | ICD-10-CM

## 2022-08-01 DIAGNOSIS — F4310 Post-traumatic stress disorder, unspecified: Principal | ICD-10-CM

## 2022-08-01 LAB — BASIC METABOLIC PANEL
ANION GAP: 7 mmol/L (ref 5–14)
BLOOD UREA NITROGEN: 24 mg/dL — ABNORMAL HIGH (ref 9–23)
BUN / CREAT RATIO: 10
CALCIUM: 9.4 mg/dL (ref 8.7–10.4)
CHLORIDE: 110 mmol/L — ABNORMAL HIGH (ref 98–107)
CO2: 25.5 mmol/L (ref 20.0–31.0)
CREATININE: 2.4 mg/dL — ABNORMAL HIGH
EGFR CKD-EPI (2021) FEMALE: 27 mL/min/{1.73_m2} — ABNORMAL LOW (ref >=60)
GLUCOSE RANDOM: 89 mg/dL (ref 70–99)
POTASSIUM: 4.9 mmol/L — ABNORMAL HIGH (ref 3.4–4.8)
SODIUM: 142 mmol/L (ref 135–145)

## 2022-08-01 LAB — PHOSPHORUS: PHOSPHORUS: 3.8 mg/dL (ref 2.4–5.1)

## 2022-08-01 LAB — CBC W/ AUTO DIFF
BASOPHILS ABSOLUTE COUNT: 0 10*9/L (ref 0.0–0.1)
BASOPHILS RELATIVE PERCENT: 1.2 %
EOSINOPHILS ABSOLUTE COUNT: 0.6 10*9/L — ABNORMAL HIGH (ref 0.0–0.5)
EOSINOPHILS RELATIVE PERCENT: 30.3 %
HEMATOCRIT: 24.5 % — ABNORMAL LOW (ref 34.0–44.0)
HEMOGLOBIN: 8.6 g/dL — ABNORMAL LOW (ref 11.3–14.9)
LYMPHOCYTES ABSOLUTE COUNT: 0.4 10*9/L — ABNORMAL LOW (ref 1.1–3.6)
LYMPHOCYTES RELATIVE PERCENT: 21.1 %
MEAN CORPUSCULAR HEMOGLOBIN CONC: 35 g/dL (ref 32.0–36.0)
MEAN CORPUSCULAR HEMOGLOBIN: 38.7 pg — ABNORMAL HIGH (ref 25.9–32.4)
MEAN CORPUSCULAR VOLUME: 110.6 fL — ABNORMAL HIGH (ref 77.6–95.7)
MEAN PLATELET VOLUME: 8 fL (ref 6.8–10.7)
MONOCYTES ABSOLUTE COUNT: 0.1 10*9/L — ABNORMAL LOW (ref 0.3–0.8)
MONOCYTES RELATIVE PERCENT: 2.8 %
NEUTROPHILS ABSOLUTE COUNT: 0.9 10*9/L — ABNORMAL LOW (ref 1.8–7.8)
NEUTROPHILS RELATIVE PERCENT: 44.6 %
PLATELET COUNT: 170 10*9/L (ref 150–450)
RED BLOOD CELL COUNT: 2.21 10*12/L — ABNORMAL LOW (ref 3.95–5.13)
RED CELL DISTRIBUTION WIDTH: 16.2 % — ABNORMAL HIGH (ref 12.2–15.2)
WBC ADJUSTED: 2.1 10*9/L — ABNORMAL LOW (ref 3.6–11.2)

## 2022-08-01 LAB — HEPATIC FUNCTION PANEL
ALBUMIN: 3.9 g/dL (ref 3.4–5.0)
ALKALINE PHOSPHATASE: 41 U/L — ABNORMAL LOW (ref 46–116)
ALT (SGPT): 8 U/L — ABNORMAL LOW (ref 10–49)
AST (SGOT): 15 U/L (ref <=34)
BILIRUBIN DIRECT: 0.8 mg/dL — ABNORMAL HIGH (ref 0.00–0.30)
BILIRUBIN TOTAL: 2.1 mg/dL — ABNORMAL HIGH (ref 0.3–1.2)
PROTEIN TOTAL: 7.2 g/dL (ref 5.7–8.2)

## 2022-08-01 LAB — LIPID PANEL
CHOLESTEROL/HDL RATIO SCREEN: 2.6 (ref 1.0–4.5)
CHOLESTEROL: 120 mg/dL (ref <=200)
HDL CHOLESTEROL: 46 mg/dL (ref 40–60)
LDL CHOLESTEROL CALCULATED: 59 mg/dL (ref 40–99)
NON-HDL CHOLESTEROL: 74 mg/dL (ref 70–130)
TRIGLYCERIDES: 76 mg/dL (ref 0–150)
VLDL CHOLESTEROL CAL: 15.2 mg/dL (ref 8–32)

## 2022-08-01 LAB — CMV DNA, QUANTITATIVE, PCR: CMV VIRAL LD: NOT DETECTED

## 2022-08-01 LAB — URINALYSIS WITH MICROSCOPY
BILIRUBIN UA: NEGATIVE
BLOOD UA: NEGATIVE
GLUCOSE UA: NEGATIVE
HYALINE CASTS: <1 /LPF (ref 0–1)
KETONES UA: NEGATIVE
LEUKOCYTE ESTERASE UA: NEGATIVE
NITRITE UA: NEGATIVE
PH UA: 6 (ref 5.0–9.0)
PROTEIN UA: NEGATIVE
RBC UA: 1 /HPF (ref <4)
SPECIFIC GRAVITY UA: 1.015 (ref 1.005–1.030)
SQUAMOUS EPITHELIAL: 32 /HPF — ABNORMAL HIGH (ref 0–5)
UROBILINOGEN UA: 0.2
WBC UA: 4 /HPF (ref 0–5)

## 2022-08-01 LAB — PROTEIN / CREATININE RATIO, URINE
CREATININE, URINE: 243.1 mg/dL
PROTEIN URINE: 18.6 mg/dL
PROTEIN/CREAT RATIO, URINE: 0.077

## 2022-08-01 LAB — MAGNESIUM: MAGNESIUM: 1.6 mg/dL (ref 1.6–2.6)

## 2022-08-01 LAB — BK VIRUS QUANTITATIVE PCR, BLOOD: BK BLOOD RESULT: NOT DETECTED

## 2022-08-01 LAB — EBV QUANTITATIVE PCR, BLOOD: EBV VIRAL LOAD RESULT: NOT DETECTED

## 2022-08-01 MED ADMIN — darbepoetin alfa-polysorbate (ARANESP) injection 100 mcg: 100 ug | SUBCUTANEOUS | @ 19:00:00 | Stop: 2022-08-01

## 2022-08-01 NOTE — Unmapped (Addendum)
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 It had been trending up to the high ones, but had stabilized, now has risen further. It is possible that her tacrolimus level is elevated to explain the increase. That level has not resulted. If her tacrolimus is not hide to explain this increase, I would recommend kidney transplant biopsy early next week. The patient is aware of this and is in agreement with that plan.    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 Other labs are reassuring.    # Immunosuppression  Tacrolimus: Her tacrolimus level today will be a 27-hour trough. Given her pancreas transplant, her goal is 8-10.  Lab Results   Component Value Date    TACROLIMUS 7.4 07/22/2022     Azathioprine 100 mg daily.  Prednisone free.    # GI symptoms/likely IBS  With improvement in her mental health, her GI symptoms have improved. For now, we will keep the GI appointment in September. If she continues to feel well, could cancel that appointment sometime in the future.    # Metabolic acidosis  Lab Results   Component Value Date    CO2 25.5 08/01/2022     Stable on sodium bicarbonate 1300 mg twice daily.    # Leukopenia  White blood cell count today is 2.1, with ANC 0.9. She has received Granix in the past. Given that she is getting Aranesp today, will hold off for now since her numbers are stable. Suspect this is secondary to bone marrow suppression from azathioprine, however I am hesitant to switch her back to mycophenolic acid since she is not actively preventing pregnancy.    # BP management  BP: 110/65 in clinic today. She has not required antihypertensives.    # Infectious disease  CMV PCR:   Lab Results   Component Value Date    CMVLR Not Detected 05/30/2022     BK PCR:   Lab Results   Component Value Date    BBKQR Not Detected 05/30/2022     EBV PCR: neg 05/30/22     # Anemia screening  Lab Results   Component Value Date    HGB 8.6 (L) 08/01/2022     Lab Results   Component Value Date    IRON 78 05/15/2022    TIBC 203 (L) 05/15/2022    LABIRON 38 05/15/2022     She has been persistently anemic, likely from CKD and azathioprine. She has persistent fatigue so I think is reasonable to give her Epogen. She was given Aranesp 100 mcg in clinic today, we will get her set up for an anemia clinic to receive Aranesp scheduled.    # Cardiovascular  The ASCVD Risk score (Arnett DK, et al., 2019) failed to calculate..  Lab Results   Component Value Date    CHOL 120 08/01/2022  HDL 46 08/01/2022    LDL 59 08/01/2022     Lipid panel at goal, continue  to monitor .    # Bone and mineral metabolism  Lab Results   Component Value Date    CALCIUM 9.4 08/01/2022      Lab Results   Component Value Date    PHOS 3.8 08/01/2022     Lab Results   Component Value Date    PTH 112.5 (H) 10/11/2021     Stable, no changes.    # Electrolytes  Lab Results   Component Value Date    MG 1.6 08/01/2022     Lab Results   Component Value Date    K 4.9 (H) 08/01/2022     Stable, no changes.    # Immunizations  Last Covid Booster: Completed - Date: 03/23/20  Flu Shot: Completed - Date: 12/21/21  Pneumococcal: Needed Prevnar-20  Shingrix: Needed age 44    # Cancer screening  PAP smear: completed - date: 02/06/21  Mammogram: N/A  Colonoscopy: N/A  Skin: recommend yearly dermatology evaluation  Renal US: completed - date: 04/24/22  CXR: completed - date: 02/01/22    # Childbearing age  She is not trying actively to prevent pregnancy. Did discuss that even though she is on an acceptable immunosuppression regimen, that it would be better to wait for pregnancy until her creatinine is stable.    Most pre-menopausal women experience return of fertility post-transplant, and a safe pregnancy is possible with careful planning. Recommend using abstinence or a highly effective contraception to avoid pregnancy until planning pregnancy, and if planning pregnancy please notify nephrology so that we can adjust medications to avoid teratogens.    There are no Patient Instructions on file for this visit.      Return in about 3 months (around 11/01/2022).    ___________________________________________________________________      Kidney Transplant History:   Date of Transplant: 05/03/2019 (Kidney / Pancreas)  Type of Transplant: Deceased  KDPI: 34%  Native Kidney Disease: Type 1 Diabetes  Prior Transplants: None    Biopsies:   Zero-Hour Biopsy: Slim biopsy core without characteristic and diagnostic abnormalities.  03/02/2021: Minor morphologic changes.    Donor Specific Antibodies:   To C17, first on 11/26/19 at 1226, peak 3268 on 10/11/21, 1551 on 05/30/22.  To DR51, first on 06/07/20 at 1330, 1103 on 05/30/22.    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 Had some low pressures as an outpatient, blood pressure meds held and was on midodrine for a time. Transitioned to Envarsus given high tacrolimus requirement.  - Admitted 6/1-07/16/19 with HA/N/V/D. Had AKI with tacrolimus level of 27.9. Had IVF, found to be C.diff positive and started on vancomycin.   - On 07/20/19 noted to have wbc < 0.6, Myfortic and Valcyte held, given Granix. Up to 3.9 on 07/26/19, Myfortic restarted at 180 mg BID (was 720 mg prior to holding).  - Diarrhea returned on 07/28/19, treated again with vanc for 14 days.  - Continued to have diarrhea, referred for FMT. Had been treated with fidaxomicin and bezlotoxumab.  - Admitted 8/20-8/22/21 for Enterococcal UTI.  - Had fecal transplant 02/14/20. Called 02/17/20 feeling poorly, admitted 1/6-1/11/22 with AKI and elevated tac level. Had proteus in urine  and blood, was Covid positive as well. Treated with cefipime then then cipro, also get remdesivir. Myfortic and Valcyte held on discharge for leukopenia. Myfortic restarted 03/03/20.  - She unfortunately had recurrence of her diarrhea which did not respond to further oral therapy, had repeat FMT on 06/19/20.  - At her visit 09/01/20 she was interested in pursuing pregnancy but knew that she could not be on Myfortic. Planned to have her see MFM for a consult prior to transitioning from Myfortic to azathioprine. She called 10/30/20 to report a positive pregnancy test. We stopped Myfortic and started azathioprine. She was seen in the ED 11/02/20 for vaginal bleeding, was seen by OB and felt to most likely be experiencing a miscarriage. Beta HCG was 14,771. On 11/08/20 was having ongoing bleeding and clots. She presented again to [facility] 11/17/20 with lower abdominal pain and bleeding. US showed no clear IUP and bHCG was down to 7672. Underwent D&C and subsequent diagnostic laparoscopy to rule out ectopic pregnancy, fallopian tubes were normal. Hgb dropped from 11 to 9. Creatinine remained at baseline. She was to get follow up bHCG following D&C but they were unable to reach her to get that done.  Micah Flesher to ED at [facility] 01/29/21 where she was found to have a bHCG of 190,000. US showed pregnancy at [redacted]w[redacted]d. She was able to tolerate po so was discharged home, creatinine was 1.53. She was seen by MFM 02/06/21. She did not have any labs between 01/29/21 and 02/23/21, creatinine from 1.53 to 1.73. Repeated labs on 02/28/21 and creatinine remained mildly elevated from baseline at 1.65. Since it was still early in pregnancy, we decided to biopsy her to rule out rejection before she was too progressed in her pregnancy. She underwent biopsy 03/02/21 that showed only minor morphological changes, no rejection.  -  She saw MFM and Dr. Thad Ranger 03/06/21 with plans to start epogen for low Hgb. Wanted to continue the pregnancy at that time, however called 03/12/21 asking to proceed with termination, due to concerns for affects of pregnancy on her kidney as well as having delivered previous pregnancy at 27 weeks. Underwent uterine vacuum aspiration on 03/20/21.   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 Was not trying to prevent pregnancy, was open to conceiving.  - Seen locally 11/22/21 for URI symptoms, Covid and strep negative. Not tested for RSV or flu that I can see. Recommended symptomatic treatment. Seen again 01/07/22 and 01/10/22 for ongoing cough, felt from GERD so protonix started.  - Noted to have wbc 1.9 on 01/09/22, planned for Granix at her appointment 01/11/22, however she cancelled that appointment stating that she was too sick to come to clinic. She ultimately had a dose on 01/24/22.  - Was seen in the ED 02/01/22 with 3 days of N/V. Given IVF and treated for possible UTI with cefdinir. Urine culture grew 50-100K CNS, GC and chlamydia negative, pregnancy negative.  - She did not connect for video visits 03/21/22 or 04/03/22.  - Presented to [facility] ED 04/06/22 with N/V. Treated for presumed pneumonia with amoxicillin, given Zofran. Symptoms improved but she experienced diarrhea and presented to ED 04/24/22 for concerns of C.diff. Renal US and CT abdomen unremarkable. She did not have a BM while in the ED for ~ 12 hours so discharged with a stool kit to collect a specimen. Seen at [facility] ED 05/03/22 for ongoing diarrhea, had not submitted stool sample. There a rapid C.diff  was negative, given IVF and discharged on Bentyl and Imodium.   - Called on call coordinator 05/04/22 complaining of ongoing abdominal cramping and diarrhea, somewhat frustrated that no one could explain her symptoms. Placed e-consult for GI, when they reached out for a history, they were unable to reach her.  - Her coordinator reached out 05/06/22 and patient stated diarrhea was better and she was not needing Imodium. Was offered an appointment with me, patient declined and preferred to see GI locally. Called 05/14/22 stating she was having extreme stomach cramps. Again declined appointment with me, asked to be admitted. Was advised to come to ED.  - Admitted from 4/2-05/16/22. Noted that on 05/03/22 at [facility], her stool sample was positive for enteropathogenic E.coli. Was started on IV Cipro empirically, but repeat GIPP returned negative. She did not have further diarrhea while in house. GI referral made. Was also treated with Flagyl for BV. Noted to have new eosinophilia, ddx includes eosinophilic colitis from azathioprine.    History of Present Illness:     Since the last visit 05/30/22:  - At her visit 05/30/22 she continued to have GI symptoms. She felt her symptoms were good for a while after her second fecal transplant, but around the end of February/early March she was put on antibiotics for pneumonia at [facility] and had GI symptoms ever since. Noted a lot of abdominal bloating. Would have 8-10 loose bowel movements at night about 1-2 times a week. Had pretty extreme abdominal cramping and burning, last 1-1.5 hours, did not change with eating or having a bowel movement. She reported that prior to kidney transplant, she had some irritable bowel symptoms, diarrhea, and sensitivity to certain foods.  - Was seen in the ED locally on 07/02/2022 to try to get established with outpatient mental health services. She reported over the previous 2 to 3 weeks an increase in her depression and anxiety. She was finding it difficult to go to work and was restless, fatigued with decreased appetite and decreased motivation. She denied suicidal ideation or plans. She was discharged with plans to follow-up with outpatient services. Our transplant psychologist contacted her 07/03/2022 and assisted her with some resources on finding mental health providers in her area as well as setting up an appointment with her on 07/12/2022. At that visit she reported that she had been engaging in group therapy through [facility] and that was really helpful for her.  She had also started Prozac. She met with our psychologist again this morning.    In clinic today:  - She notes that her mood is improved. Her abdominal symptoms have completely resolved. She still has a GI appointment in [place] in September, and is wondering if she should keep that.  - She did not feel Prozac was helping, that was switched to Lexapro. She has noted improvement in her mood with that but is causing a lot of daytime fatigue.  - Her main complaint today is of ongoing fatigue. She has decided not to work for now, as she tries to work on these other aspects of her health.    Last dose of tacrolimus: 10 am    Concerns about nonadherence: In the past, seems to be on track now    Social: She lives in [place], not working currently. Has a son born in 8.    Review of Systems:   All other systems negative, except as documented in the HPI.    Physical Exam:    BP 110/65 (BP Site:  L Arm, BP Position: Sitting, BP Cuff Size: Medium)  - Pulse 75  - Temp 36.2 ??C (97.1 ??F) (Temporal)  - Wt 66 kg (145 lb 6.4 oz)  - BMI 24.20 kg/m??   General: Patient is a pleasant female in no apparent distress.  Eyes: Sclera anicteric.  ENT: Oropharynx without lesions.   Neck: Supple without LAD/JVD/bruits.  Lungs: Clear to auscultation bilaterally, no wheezes/rales/rhonchi.  Cardiovascular: Regular rate and rhythm without murmurs, rubs or gallops.  Abdomen: Soft, notender/nondistended. Positive bowel sounds. No tenderness over the graft.  Extremities: Without edema, joints without evidence of synovitis.  Skin: Without rash.  Neurological: Grossly nonfocal.  Psychiatric:  Somewhat flat affect .      Allergies:   Allergies   Allergen Reactions    Iodinated Contrast Media Other (See Comments), Rash and Swelling     Burning, warmth throughout body and tingling.  Throat swelling.  Treated with benadryl and symptoms improved.  Has not had contrast since then (2013 or 2014).    Burning    Nickel Rash    Propranolol Other (See Comments) and Swelling    Eye Irrigating Solution [Ophthalmic Irrigation Solution] Other (See Comments)     Contrast dye (name?) used in eyes caused hot feeling in face, reversed with Benadryl.     Iodine      Other reaction(s): Skin Rash    Naltrexone Rash    Uni-Cortrom Rash        Current Medications:   Current Outpatient Medications   Medication Sig Dispense Refill    aspirin (ECOTRIN) 81 MG tablet Take 1 tablet (81 mg total) by mouth daily. 30 tablet 11    azathioprine (IMURAN) 50 mg tablet Take 2 tablets (100 mg total) by mouth daily 60 tablet 11    dicyclomine (BENTYL) 10 mg capsule Take 1 capsule (10 mg total) by mouth Four (4) times a day (before meals and nightly). 120 capsule 11    escitalopram oxalate (LEXAPRO) 5 MG tablet Take 1 tablet (5 mg total) by mouth daily.      metroNIDAZOLE (FLAGYL) 500 MG tablet Take 1 tablet (500 mg total) by mouth two (2) times a day. 10 tablet 0    simethicone (GAS-X) 180 mg capsule Take 1 capsule (180 mg total) by mouth every six (6) hours as needed for flatulence. 120 capsule 11    sodium bicarbonate 650 mg tablet Take 2 tablets (1,300 mg total) by mouth Three (3) times a day. 180 tablet 3    tacrolimus (ENVARSUS XR) 4 mg Tb24 extended release tablet Take 4 tablets (16 mg total) by mouth daily. 120 tablet 11     No current facility-administered medications for this visit.         Laboratory Studies:  Recent Results (from the past 170 hour(s))   Phosphorus Level    Collection Time: 08/01/22  1:10 PM   Result Value Ref Range    Phosphorus 3.8 2.4 - 5.1 mg/dL Magnesium Level    Collection Time: 08/01/22  1:10 PM   Result Value Ref Range    Magnesium 1.6 1.6 - 2.6 mg/dL   Lipid panel    Collection Time: 08/01/22  1:10 PM   Result Value Ref Range    Triglycerides 76 0 - 150 mg/dL    Cholesterol 161 <=096 mg/dL    HDL 46 40 - 60 mg/dL    LDL Calculated 59 40 - 99 mg/dL    VLDL Cholesterol Cal 15.2 8 - 32 mg/dL  Chol/HDL Ratio 2.6 1.0 - 4.5    Non-HDL Cholesterol 74 70 - 130 mg/dL    FASTING Yes    Hepatic Function Panel    Collection Time: 08/01/22  1:10 PM   Result Value Ref Range    Albumin 3.9 3.4 - 5.0 g/dL    Total Protein 7.2 5.7 - 8.2 g/dL    Total Bilirubin 2.1 (H) 0.3 - 1.2 mg/dL    Bilirubin, Direct 1.61 (H) 0.00 - 0.30 mg/dL    AST 15 <=09 U/L    ALT 8 (L) 10 - 49 U/L    Alkaline Phosphatase 41 (L) 46 - 116 U/L   CBC w/ Differential    Collection Time: 08/01/22  1:10 PM   Result Value Ref Range    WBC 2.1 (L) 3.6 - 11.2 10*9/L    RBC 2.21 (L) 3.95 - 5.13 10*12/L    HGB 8.6 (L) 11.3 - 14.9 g/dL    HCT 60.4 (L) 54.0 - 44.0 %    MCV 110.6 (H) 77.6 - 95.7 fL    MCH 38.7 (H) 25.9 - 32.4 pg    MCHC 35.0 32.0 - 36.0 g/dL    RDW 98.1 (H) 19.1 - 15.2 %    MPV 8.0 6.8 - 10.7 fL    Platelet 170 150 - 450 10*9/L    Neutrophils % 44.6 %    Lymphocytes % 21.1 %    Monocytes % 2.8 %    Eosinophils % 30.3 %    Basophils % 1.2 %    Absolute Neutrophils 0.9 (L) 1.8 - 7.8 10*9/L    Absolute Lymphocytes 0.4 (L) 1.1 - 3.6 10*9/L    Absolute Monocytes 0.1 (L) 0.3 - 0.8 10*9/L    Absolute Eosinophils 0.6 (H) 0.0 - 0.5 10*9/L    Absolute Basophils 0.0 0.0 - 0.1 10*9/L    Macrocytosis Slight (A) Not Present   Basic Metabolic Panel    Collection Time: 08/01/22  1:10 PM   Result Value Ref Range    Sodium 142 135 - 145 mmol/L    Potassium 4.9 (H) 3.4 - 4.8 mmol/L    Chloride 110 (H) 98 - 107 mmol/L    CO2 25.5 20.0 - 31.0 mmol/L    Anion Gap 7 5 - 14 mmol/L    BUN 24 (H) 9 - 23 mg/dL    Creatinine 4.78 (H) 0.55 - 1.02 mg/dL    BUN/Creatinine Ratio 10     eGFR CKD-EPI (2021) Female 27 (L) >=60 mL/min/1.71m2    Glucose 89 70 - 99 mg/dL    Calcium 9.4 8.7 - 29.5 mg/dL   Urinalysis with Microscopy    Collection Time: 08/01/22  3:14 PM   Result Value Ref Range    Color, UA Yellow     Clarity, UA Clear     Specific Gravity, UA 1.015 1.005 - 1.030    pH, UA 6.0 5.0 - 9.0    Leukocyte Esterase, UA Negative Negative    Nitrite, UA Negative Negative    Protein, UA Negative Negative    Glucose, UA Negative Negative    Ketones, UA Negative Negative    Urobilinogen, UA 0.2 mg/dL 0.2 - 2.0 mg/dL    Bilirubin, UA Negative Negative    Blood, UA Negative Negative    RBC, UA 1 <4 /HPF    WBC, UA 4 0 - 5 /HPF    Squam Epithel, UA 32 (H) 0 - 5 /HPF    Bacteria, UA Few (A)  None Seen /HPF    Hyaline Casts, UA <1 0 - 1 /LPF   Protein/Creatinine Ratio, Urine    Collection Time: 08/01/22  3:14 PM   Result Value Ref Range    Creat U 243.1 Undefined mg/dL    Protein, Ur 16.1 Undefined mg/dL    Protein/Creatinine Ratio, Urine 0.077 Undefined           Electronically signed by:   Drinda Butts, MD  [facility]      I personally spent 55 minutes face-to-face and non-face-to-face in the care of this patient, which includes all pre, intra and post visit time on the date of service.

## 2022-08-01 NOTE — Unmapped (Signed)
Met w/ patient in [facility] today. Reviewed meds/symptoms. Any new medications? Lexapro 5mg  daily                Fever/cold/flu symptoms denies  BP: 110/65 today/ Home BP reported stable, not checking  BG: wnl  Headache/Dizziness/Lightheaded: denies  Hand tremors: stable, minor  Numbness/tingling: denies  Fevers/chills/sweats: denies  CP/SOB/palpatations: Denies Cough, occasional CP  Nausea/vomiting/heartburn: denies  Diarrhea/constipation: stable  UTI symptoms (burn/pain/itch/frequency/urgency/odor/color/foam): denies  No visible or palpable edema     Appetite good; reports adequate hydration. 4-5 16 oz/bottles/fluid per day.     Pt reports being well rested and getting adequate exercise.     Continues to follow Covid/health safety precautions by taking care to mask, perform frequent hand hygeine and minimal public activity. Offered support and guidance for this process given their immune-suppressed state. We discussed reduced covid vaccine coverage for transplant patients and importance of continuing to mask and practice safe distancing. Commented that booster vaccines will likely be advised as an ongoing process.     Pain: denies     Last Envarsus taken 1045; held for this morning's labs. Current dose 16 mg daily; Azathioprine 100mg  daily     Other complaints or concerns: persistent fatigue sleeping 12-13hrs/day    Pt Follow up: psych - Lexapro     Functional Score: 100     Employment/work status:  works full time

## 2022-08-01 NOTE — Unmapped (Signed)
MD ordered Aranesp 100 mcg this visit.  Aranesp 100 mcg administered in left arm. Tolerated well.  Patient to start Anemia clinic in 4 weeks. Appt. scheduled, instruction provided to have labs drawn prior to Anemia clinic Nurse visit.  Patient verbalized understanding

## 2022-08-01 NOTE — Unmapped (Addendum)
Confidential Psychological Therapy Session  Elaine Koch     Patient Name: Elaine Koch  Medical Record Number: [id number]  Date of Service: August 01, 2022  Elaine Psychologist: Artemio Aly, PhD  Intern: None  Time Spent: 60 min of face-to-face counseling  CPT Procedure Code: 28413 (60 min psychotherapy with patient and/or family)  Therapy Type: Behavior Modifying/Cognitive Behavioral Therapy (CBT)  Purpose of Treatment: assess current MH needs, reduce depression symptoms, reduce anxiety symptoms, improve quality of life, improve coping, adjustment to chronic medical illness, safety planning     *This patient was not seen in person to minimize potential spread of COVID-19, protect patients/family/providers, and reduce PPE utilization. During this time, transplant psychology will be limiting person-to-person contact when possible.*          The patient reports they are physically located in [place] and is currently: not at home (parked in car after dropping son off at camp). I conducted a audio/video visit. I spent  39m 23s on the video call with the patient. I spent an additional 30 minutes on pre- and post-visit activities on the date of service .      The limits of confidentiality and the purpose of the evaluation were reviewed. The patient was provided with a verbal description of the nature and purpose of the psychological evaluation. I also reviewed the referral source, specific referral question for this evaluation, foreseeable risks/discomforts, benefits, limits of confidentiality, and mandatory reporting requirements of this provider. The patient was given the opportunity to ask questions and receive answers about the present evaluation. Oral consent was provided by the patient.      This evaluation note may contain sensitive and confidential information regarding the patient???s psychosocial adjustment to living with a chronic medical condition. DO NOT share this information outside Elaine Koch without written consent from the patient explicitly stating that mental health records may be released.      Referral/Relevant History:  Ms.  Elaine Koch is a very pleasant 37 y.o.  female who presents for cognitive behavioral therapy to clarify current MH needs and address symptoms of anxiety that were reported during her most recent post-transplant nephrology follow-up with Elaine Koch on 05/30/22. At that time, Elaine Koch notes that pt's Koch has been somewhat disjointed due to many ED visits without outpatient nephrology follow-up, and pt's [facility] Elaine Squibb, RN Pharmacist, hospital that pt had been expressing anxiety about labs draws and managing post-transplant Koch in addition to other life stressors. 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      Elaine Koch met with Koch initially on 07/12/22, when she endorsed symptoms consistent with PTSD, MDD Recurrent, and panic attacks, as well as frequent passive SI. She was currently engaged with a PHP through Elaine Koch which had been beneficial, but as this was only a 21-day program, Elaine research associate agreed to continue to follow her for support until she is able to connect with a consistent, long-term provider. She was seen today for her 2nd appt with Elaine research associate.     Review of Symptoms/ROS: Deferred     Subjective:   Elaine Koch reported that things had been okay over the past three weeks, though she has had a few periods of being really okay and some being really bad. More recently she has been feeling bad again, which she described as frequently fatigued and spending most of her time in bed, sleeping 12-13 hours/day. She has been having trouble motivating herself to do things, aside from  external motivators (e.g. spending time with a new partner, taking Koch of her son). She denied any concerns with insomnia. Elaine Koch also reported that last night she was having frequent passive SI, though was able to talk to a friend and go to sleep which helped. She continues to deny any plan or intent to act on these thoughts, but continues to feel like she is a burden to others and things would be better if she was not here.     Elaine Koch described how during her first week of her PHP, she was revitalized and doing tings like working out at [employer] and going outside; this progress has slowed, however. We discussed how avoidance is common with depression and anxiety, and began introducing topics like behavioral activation and focusing on value-driven behavior. Elaine Koch identified several key values, including being a caring and loyal person. She also endorsed wanting to be strong and resilient, though reflected on how she feels like she has only been surviving, not coping well with challenges in life. Elaine Koch described having a lot of guilt and regret about things in her life, like wishing that she had taken better Koch of her health when she was younger and finished school so she could have a better job to support her son. We processed this and discussed the purpose of guilt, like learning from past mistakes and making changes for the future.     Elaine Koch noted that her final day at her PHP is on Monday 6/24. She does not have a follow-up outpatient appt scheduled yet, but has reached out to several providers and is waiting to hear back. Elaine Koch was encouraged to continue working on this, and was reminded of crisis resources discussed during last session.      Objective:   Mental Status Exam:  Appearance: Appears stated age and Clean/Neat, tearful at times  Motor: No abnormal movements  Speech/Language: Normal rate, volume, tone, fluency  Mood: Depressed and Anxious  Affect: Anxious, Depressed, and Mood congruent  Thought Process: Logical, linear, clear, coherent, goal directed  Thought Content: Suicidal Ideation, passive, denies plan/intent; denies HI or A/VH  Perceptual Disturbances: Denies auditory and visual hallucinations, behavior not concerning for response to internal stimuli  Orientation: Oriented to person, place, time, and general circumstances  Attention: Able to fully attend without fluctuations in consciousness  Concentration: Able to fully concentrate and attend  Memory: Immediate, short-term, long-term, and recall grossly intact  Fund of Knowledge: Consistent with level of education and development  Insight: Fair  Judgment: Fair  Impulse Control: Fair        Assessment:  Ms.  Tison participated well in this CBT session and exhibits good motivation towards treatment goals. Today she reported ongoing significant symptoms of depression and anxiety, though this week she endorsed more depression (particularly fatigue, anhedonia, hypersomnia) than anxiety. She also endorsed ongoing passive SI over the past 1-2 months, though adamantly denied current plan/intent. She was engaged in safety planning during previous session and reminded of crisis resources today; she presented to a behavioral health urgent Koch about two months ago for an episode of panic and SI and was encouraged to utilize crisis resources again if needed. She remains actively engaged with her 21-day PHP program, though this will be ending next week and she is still working on finding her next outpatient MH provider.      Taken altogether, Ms. Minetti appears to be experiencing severe symptoms of depression and anxiety at this  time; anxiety appears to be best explained by recent trauma, as her symptoms do appear to be consistent with PTSD. She would benefit from consistent, active engagement in [facility] Koch to treat these symptoms; while her current 21-day PHP will be beneficial, this is also a fairly short-term option (also now on Prozac, with initial benefit). She stated that her PHP will help her find long-term treatment after this, and Koch will attempt to continue to meet with her as a bridge until she establishes local MH Koch (preferably both medication management and therapy)      Focus on current treatment is assessing current MH needs, safety planning, treatment planning.      Focus on future sessions will include ongoing assessment and safety planning, introducing and practicing CBT-oriented coping strategies.     Adherence concerns: None reported today, though previous nephrology notes suggest some issues with obtaining labs consistently and disjointed Koch (more ED visits than outpatient nephrology follow-up).     Diagnostic Impression: Posttraumatic Stress Disorder; Major Depressive Disorder, Severe, Recurrent; Unspecified Anxiety Disorder (panic attacks)        Risk Assessment:  A suicide and violence risk assessment was performed as part of this evaluation. The patient is deemed to be at chronic elevated risk for self-harm/suicide given the following factors: recent bereavement, recent trauma, current diagnosis of depression, hopelessness, previous acts of self-harm, suicidal ideation or threats without a plan, chronic severe medical condition, chronic mental illness > 5 years, and past diagnosis of depression. The patient is deemed to be at chronic elevated risk for violence given the following factors: recent loss. These risk factors are mitigated by the following factors:no know access to weapons or firearms, no history of violence, motivation for treatment, supportive family, sense of responsibility to family and social supports, minor children living at home, presence of an available support system, employment or functioning in a structured work/academic setting, religious or spiritual prohibition to suicide/violence, and safe housing. There is no acute risk for suicide or violence at this time. The patient was educated about relevant modifiable risk factors including following recommendations for treatment of psychiatric illness and abstaining from substance abuse.               While future psychiatric events cannot be accurately predicted, the patient does not currently require  acute inpatient psychiatric Koch and does not currently meet [facility] involuntary commitment criteria.               Psychometric Testing: None administered today.          Plan:  Ms.  Deyarmin will continue with her PHP at [facility] for 21 days and plans to work with them to find a long-term MH plan. Koch will attempt to meet with her as a bridge to ensure she connects with a provider.     Ms.  Briones will return Wednesday 08/07/22 at 9:30am via video.       Ms.  Barsch was given this Koch's contact information with confidential voice mail number and instructed to call 911 for emergencies.

## 2022-08-02 ENCOUNTER — Other Ambulatory Visit (HOSPITAL_COMMUNITY): Payer: Medicare Other

## 2022-08-02 LAB — HEMOGLOBIN A1C
ESTIMATED AVERAGE GLUCOSE: 88 mg/dL
HEMOGLOBIN A1C: 4.7 % — ABNORMAL LOW (ref 4.8–5.6)

## 2022-08-02 LAB — TACROLIMUS LEVEL, TROUGH: TACROLIMUS, TROUGH: 6.1 ng/mL (ref 5.0–15.0)

## 2022-08-05 ENCOUNTER — Other Ambulatory Visit (HOSPITAL_COMMUNITY): Payer: Medicare Other | Admitting: Licensed Clinical Social Worker

## 2022-08-05 DIAGNOSIS — Z940 Kidney transplant status: Principal | ICD-10-CM

## 2022-08-05 DIAGNOSIS — F331 Major depressive disorder, recurrent, moderate: Secondary | ICD-10-CM | POA: Diagnosis not present

## 2022-08-05 DIAGNOSIS — F321 Major depressive disorder, single episode, moderate: Secondary | ICD-10-CM

## 2022-08-05 NOTE — Unmapped (Addendum)
[facility] Nephrology and Hypertension  Kidney Transplant Biopsy Note    Date of Biopsy Referral: 08/07/22    Referring Transplant Provider: Lisbeth Ply  Pager: [phone]  Phone: [phone]    Kidney Transplant Coordinator: Celene Squibb    Biopsy Fellow  _____________________ at _______________________ otherwise contact referring provider.     (NAME)   (CONTACT - pager/cell phone #)    ---------------------------------------------------------------------------------------------------------------------  PLEASE INDICATED BIOPSY URGENCY:  Next day read for possible rejection     Patient Name: Elaine Koch  MR: [id number]  Age: 37 y.o.  Sex: Female Race: Black/African American Ethnicity: Not Hispanic, Latino/a, or Spanish origin   DOB:[birth year]    Procedures: Ultrasound Guided Percutaneous Kidney Biopsy under Moderate Sedation  Tissue Submitted: Kidney  Special Studies Required: LM, IF, EM  ----------------------------------------------------------------------------------------------------------------------  Date of allograft implantation: 05/03/19 (Kidney-pancreas)  ABO Incompatible: No  Underlying native kidney disease: Type 1 diabetes  Was the diagnosis established by biopsy? No  Previous transplant biopsies: Yes  If yes, what were the previous diagnoses? 03/02/21: Minor morphologic changes.   Previous kidney transplants: No If yes, this is #:     Donor Information (if available)  Age of donor: 12  Gender: Female  Race:   Type of donor: Cadaveric  Ischemia time: 517 min  Delayed graft function: No  If yes, how many days on dialysis:     History/Clinical Diagnosis/Indication for Biopsy:     36 yo with creatinine up to 2.4 from recent baseline 1.9-2.0 and historical baseline 1.5-1.8. Concern for rejection. Is on azathioprine due to prior pregnancy (terminated) and not using birth control currently.    ----------------------------------------------------------------------------------------------------------------------  Current Baseline Immunosuppression: cyclosporine and azathioprine (please select ALL drugs used)  Specific anti-rejection treatment WITHIN 2 WEEKS before biopsy: No  If yes, what was the type of treatment?   Patient off immunosuppression?: No  Patient seems adherent to immunosuppression? Yes  Patient is currently back on dialysis? No  Has patient had any prior episodes of rejection? No   If yes, what was the treatment?   ----------------------------------------------------------------------------------------------------------------------  Donor Specific Antibody Results:  Lab Results   Component Value Date    Donor ID [id number] 08/01/2022    Donor HLA-A Antigen #1 A30 08/01/2022    Anti-Donor HLA-A #1 MFI 89 08/01/2022    Donor HLA-A Antigen #2 A68 08/01/2022    Donor HLA-B Antigen #1 B72 08/01/2022    Anti-Donor HLA-B #1 MFI 47 08/01/2022    Donor HLA-B Antigen #2 B42 08/01/2022    Anti-Donor HLA-B #2 MFI 61 08/01/2022    Donor HLA-C Antigen #1 C2 08/01/2022    Anti-Donor HLA-C #1 MFI 255 09/07/2020    Donor HLA-C Antigen #2 C17 08/01/2022    Anti-Donor HLA-C #2 MFI 1376 (H) 08/01/2022    Donor HLA-DR Antigen #1 DR1 08/01/2022    Anti-Donor HLA-DR #1 MFI 183 08/01/2022    Donor HLA-DR Antigen #2 DR15 08/01/2022    Anti-Donor HLA-DR #2 MFI 222 08/01/2022    Donor HLA-DQB Antigen #1 DQ5 08/01/2022    Anti-Donor HLA-DQB #1 MFI 47 08/01/2022    Donor HLA-DQB Antigen #2 DQ6 08/01/2022    Anti-Donor HLA-DQB #2 MFI 369 08/01/2022    DSA Comment  08/01/2022      Comment:      The HLA antigens listed are donor antigens and the corresponding MFI value.  MFI values >= 1000 are considered positive.  A negative result does not exclude the presence of low level DSA.  Blank donor antigen and MFI fields indicate unavailable donor HLA   type or lack of appropriate single antigen bead to determine DSA. As such, the presence or absence of DSA to the locus cannot be confirmed.     Donor specific antibody (DSA) testing is performed with a  solid phase multiplex single antigen bead array method.  All procedures and reagents have been validated and performance characteristics determined by the [facility].    Certain of these tests have not been cleared/approved by the U.S. Food and Drug Administration (FDA).  The FDA has determined that such approval/clearance is not necessary because this laboratory is certified under the Clinical Laboratory Improvements   Amendments to perform high complexity testing.  This test is used for clinical purposes.  It should not be regarded as investigational or for research.  All HLA typings are performed using molecular methodologies.  HLA-A, B, C, DR, and DQ results are   serological equivalents of the molecular type.         If Outside DSA Results: Class 1  Class 2  (Delete if not performed at outside lab)    Blood Pressure (mmHg):   BP Readings from Last 3 Encounters:   08/01/22 110/65   05/30/22 138/82   05/16/22 137/79     On Anti-hypertensive Therapy: No    Urinalysis:  Lab Results   Component Value Date    Color, UA Yellow 08/01/2022    Color, UA Yellow 01/09/2022    Specific Gravity, UA 1.015 08/01/2022    Specific Gravity, UA 1.013 01/09/2022    pH, UA 6.0 08/01/2022    pH, UA 7.0 01/09/2022    Glucose, UA Negative 08/01/2022    Glucose, UA Negative 01/09/2022    Ketones, UA Negative 08/01/2022    Ketones, UA Negative 01/09/2022    Blood, UA Negative 08/01/2022    Blood, UA Negative 01/09/2022    Nitrite, UA Negative 08/01/2022    Nitrite, UA Negative 01/09/2022    Leukocyte Esterase, UA Negative 08/01/2022    Leukocyte Esterase, UA Trace (A) 01/09/2022    Urobilinogen, UA 0.2 mg/dL 09/81/1914    Urobilinogen, UA 0.2 01/09/2022    Bilirubin, UA Negative 08/01/2022    Bilirubin, UA Negative 01/09/2022    WBC, UA 6-10 (A) 01/09/2022    RBC, UA None seen 01/09/2022     Urine protein/creatinine ratio:   Lab Results   Component Value Date    Protein/Creatinine Ratio, Urine 0.077 08/01/2022    (enter if UPCR from outside lab, otherwise delete)    Creatinine (present peak): 2.4  Creatinine (baseline): 1.9  Lab Results   Component Value Date    CREATININE 2.40 (H) 08/01/2022    CREATININE 1.96 (H) 07/22/2022    CREATININE 1.93 (H) 07/03/2022    CREATININE 1.95 (H) 05/30/2022    CREATININE 2.10 (H) 05/16/2022       Glucose:   Lab Results   Component Value Date    Glucose 89 08/01/2022     HGBA1C:   Lab Results   Component Value Date    Hemoglobin A1C 4.7 (L) 08/01/2022     ----------------------------------------------------------------------------------------------------------------------  Clinical signs of infections at time of current biopsy:  BK: No   BK blood viral load:   Lab Results   Component Value Date    BK Blood Result Not Detected 08/01/2022      Outside BK Blood Viral Load:  (delete if none)   Urine decoy cells present: No  CMV: No   CMV viral load:   Lab Results   Component Value Date    CMV Viral Ld Not Detected 08/01/2022    CMV Quant Negative 04/14/2020    CMV Quant Log(10)  03/06/2021      Comment:      <1.70 log      Outside CMV Blood Viral Load:  (delete if none)     EBV:No   EBV viral load: No results found for: EBVIU, EBVLOG    HSV: No  Hepatitis B: No  Hepatitis C: No  Bacteria: No  Fungi: No  Urinary tract infection: No  Other:   ----------------------------------------------------------------------------------------------------------------------  Stenosis of renal artery: No  Obstruction of ureter: No  Lymphocele: No

## 2022-08-05 NOTE — Progress Notes (Signed)
Virtual Visit via Video Note   I connected with Christine Reed on 08/05/22 at  9:00 AM EDT by a video enabled telemedicine application and verified that I am speaking with the correct person using two identifiers.   At orientation to the IOP program, Case Manager discussed the limitations of evaluation and management by telemedicine and the availability of in person appointments. The patient expressed understanding and agreed to proceed with virtual visits throughout the duration of the program.   Location:  Patient: Patient Home Provider: OPT BH Office   History of Present Illness: MDD    Observations/Objective: Check In: Case Manager checked in with all participants to review discharge dates, insurance authorizations, work-related documents and needs from the treatment team regarding medications. Christine Reed stated needs and engaged in discussion.    Initial Therapeutic Activity: Counselor facilitated a check-in with Christine Reed to assess for safety, sobriety and medication compliance.  Counselor also inquired about Christine Reed's current emotional ratings, as well as any significant changes in thoughts, feelings or behavior since previous check in.  Christine Reed presented for session on time and was alert, oriented x5, with no evidence or self-report of active SI/HI or A/V H.  Christine Reed reported compliance with medication and denied use of alcohol or illicit substances.  Christine Reed reported scores of 4/10 for depression, 4/10 for anxiety, and 2/10 for anger/irritability.  Christine Reed denied any recent panic attacks.  Christine Reed reported that a recent struggle was having to miss group for a few days in order to attend medical appointments, although the appointments went well overall.  She reported that she did have an outburst on Friday because she wasn't getting a response from her partner, and started to catastrophize, stating "I felt so bad I started crying".  Christine Reed reported that her goal today is to take time to rest after group since it  was a busy weekend.         Second Therapeutic Activity: Counselor introduced topic of self-care today.  Counselor explained how this can be defined as the things one does to maintain good health and improve well-being.  Counselor provided members with a self-care assessment form to complete.  This handout featured various sub-categories of self-care, including physical, psychological/emotional, social, spiritual, and professional.  Members were asked to rank their engagement in the activities listed for each dimension on a scale of 1-3, with 1 indicating 'Poor', 2 indicating 'Ok', and 3 indicating 'Well'.  Counselor invited members to share results of their assessment, and inquired about which areas of self-care they are doing well in, as well as areas that require attention, and how they plan to begin addressing this during treatment.  Intervention was effective, as evidenced by Chestine successfully completing initial 2 sections of assessment and actively engaging in discussion on subject, reporting that she is excelling in areas such as taking care of personal hygiene, wearing clothes that make her feel good, participating in fun activities, doing comforting things, and finding reasons to laugh, but would benefit from focusing more on areas such as eating healthy foods, eating regularly, resting when sick, taking time off work, getting away from distractions, expressing feelings in a healthy way, and recognizing strengths and accomplishments.  Christine Reed reported that she would work to improve self-care deficits by establishing a healthier eating routine to ensure regular meals throughout the day, working with a nutritionist to include more vegetables or fruits in diet, being more compassionate to her physical health needs when falling ill, setting healthy social media boundaries to cut  down on distractions, and giving herself credit for accomplishments like finding a new job she is excited about.    Assessment and  Plan: Counselor recommends that Christine Reed remain in IOP treatment to better manage mental health symptoms, ensure stability and pursue completion of treatment plan goals. Counselor recommends adherence to crisis/safety plan, taking medications as prescribed, and following up with medical professionals if any issues arise.    Follow Up Instructions: Counselor will send Webex link for session tomorrow.  Christine Reed was advised to call back or seek an in-person evaluation if the symptoms worsen or if the condition fails to improve as anticipated.   Collaboration of Care:   Medication Management AEB Dr. Eliseo Gum or Hillery Jacks, NP                                          Case Manager AEB Jeri Modena, CNA   Patient/Guardian was advised Release of Information must be obtained prior to any record release in order to collaborate their care with an outside provider. Patient/Guardian was advised if they have not already done so to contact the registration department to sign all necessary forms in order for [place] to release information regarding their care.   Consent: Patient/Guardian gives verbal consent for treatment and assignment of benefits for services provided during this visit. Patient/Guardian expressed understanding and agreed to proceed.  I provided 180 minutes of non-face-to-face time during this encounter.   Noralee Stain, [place], LCAS 08/05/22

## 2022-08-06 ENCOUNTER — Other Ambulatory Visit (HOSPITAL_COMMUNITY): Payer: Medicare Other | Admitting: Licensed Clinical Social Worker

## 2022-08-06 DIAGNOSIS — F331 Major depressive disorder, recurrent, moderate: Secondary | ICD-10-CM | POA: Diagnosis not present

## 2022-08-06 DIAGNOSIS — F321 Major depressive disorder, single episode, moderate: Secondary | ICD-10-CM

## 2022-08-06 NOTE — Unmapped (Signed)
Reviewed recent labs with Dr. Carlene Coria, pt agreeable to kidney biopsy for this Thursday 6/27.  Message sent to TPA to schedule.  Pt advised she will need to be NPO at midnight on Wednesday and present to main hospital between 7-7:30 for procedure.  She will have a driver with her.

## 2022-08-06 NOTE — Progress Notes (Signed)
Virtual Visit via Video Note   I connected with Jadasia K. Marietta on 08/06/22 at  9:00 AM EDT by a video enabled telemedicine application and verified that I am speaking with the correct person using two identifiers.   At orientation to the IOP program, Case Manager discussed the limitations of evaluation and management by telemedicine and the availability of in person appointments. The patient expressed understanding and agreed to proceed with virtual visits throughout the duration of the program.   Location:  Patient: Patient Home Provider: OPT BH Office   History of Present Illness: MDD    Observations/Objective: Check In: Case Manager checked in with all participants to review discharge dates, insurance authorizations, work-related documents and needs from the treatment team regarding medications. Chasitee stated needs and engaged in discussion.    Initial Therapeutic Activity: Counselor facilitated a check-in with Makynzie to assess for safety, sobriety and medication compliance.  Counselor also inquired about Ayde's current emotional ratings, as well as any significant changes in thoughts, feelings or behavior since previous check in.  Derenda presented for session on time and was alert, oriented x5, with no evidence or self-report of active SI/HI or A/V H.  Jayley reported compliance with medication and denied use of alcohol or illicit substances.  Tristyn reported scores of 3/10 for depression, 6/10 for anxiety, and 5/10 for anger/irritability.  Bernis denied any recent outbursts or panic attacks.  Nelly reported that a recent success was reaching out to an individual counselor yesterday, and making an initial appointment after MHIOP ends.  Takyla denied any recent struggles.  Aisley reported that her goal today is to take her car to the shop to have a warning light assessed.       Second Therapeutic Activity: Counselor introduced topic of anger management today.  Counselor virtually shared a handout with  members on this subject featuring a variety of coping skills, and facilitated discussion on these approaches.  Examples included raising awareness of anger triggers, practicing deep breathing, keeping an anger log to better understand episodes, using diversion activities to distract oneself for 30 minutes, taking a time out when necessary, and being mindful of warning signs tied to thoughts or behavior.  Counselor inquired about which techniques group members have used before, what has proved to be helpful, what their unique warning signs might be, as well as what they will try out in the future to assist with de-escalation.  Intervention was effective, as evidenced by [facility] participating in discussion on activity, and reporting that anger has caused problems for her at work when things don't go right, or a preventable problem arises.  She reported that when overwhelmed she tends to 'shut down', but outbursts are possible when emotional limits are exceeded.  Desare reported that her triggers include feeling embarrassed, misunderstood, ignored, spoken down to as if she is a child, feeling like she failed at something, having people be rude or inconsiderate.  Arneda reported that warning signs include irritability, increased body temperature, flushed skin, and tearfulness.  Averey reported that she will work to manage anger more effectively by using coping skills such as keeping an anger journal to track progress in managing outbursts, counting to 10 or practicing deep breathing to calm down, and using "I" statements to communicate thoughts and feelings in less aggressive ways.  Assessment and Plan: Counselor recommends that Kenyana remain in IOP treatment to better manage mental health symptoms, ensure stability and pursue completion of treatment plan goals. Counselor recommends adherence to crisis/safety plan,  taking medications as prescribed, and following up with medical professionals if any issues arise.     Follow Up Instructions: Counselor will send Webex link for session tomorrow.  Lunabella was advised to call back or seek an in-person evaluation if the symptoms worsen or if the condition fails to improve as anticipated.   Collaboration of Care:   Medication Management AEB Dr. Eliseo Gum or Hillery Jacks, NP                                          Case Manager AEB Jeri Modena, CNA   Patient/Guardian was advised Release of Information must be obtained prior to any record release in order to collaborate their care with an outside provider. Patient/Guardian was advised if they have not already done so to contact the registration department to sign all necessary forms in order for [place] to release information regarding their care.   Consent: Patient/Guardian gives verbal consent for treatment and assignment of benefits for services provided during this visit. Patient/Guardian expressed understanding and agreed to proceed.  I provided 180 minutes of non-face-to-face time during this encounter.   Noralee Stain, [place], LCAS 08/06/22

## 2022-08-07 ENCOUNTER — Other Ambulatory Visit (HOSPITAL_COMMUNITY): Payer: Medicare Other

## 2022-08-07 ENCOUNTER — Telehealth: Admit: 2022-08-07 | Discharge: 2022-08-08 | Payer: MEDICARE | Attending: Clinical | Primary: Clinical

## 2022-08-07 DIAGNOSIS — F4310 Post-traumatic stress disorder, unspecified: Principal | ICD-10-CM

## 2022-08-07 LAB — HLA DS POST TRANSPLANT
ANTI-DONOR DRW #2 MFI: 1041 MFI — ABNORMAL HIGH
ANTI-DONOR HLA-A #1 MFI: 89 MFI
ANTI-DONOR HLA-A #2 MFI: 0 MFI
ANTI-DONOR HLA-B #1 MFI: 47 MFI
ANTI-DONOR HLA-B #2 MFI: 61 MFI
ANTI-DONOR HLA-C #2 MFI: 1376 MFI — ABNORMAL HIGH
ANTI-DONOR HLA-DQB #1 MFI: 47 MFI
ANTI-DONOR HLA-DQB #2 MFI: 369 MFI
ANTI-DONOR HLA-DR #1 MFI: 183 MFI
ANTI-DONOR HLA-DR #2 MFI: 222 MFI

## 2022-08-07 LAB — FSAB CLASS 1 ANTIBODY SPECIFICITY: HLA CLASS 1 ANTIBODY RESULT: POSITIVE

## 2022-08-07 LAB — FSAB CLASS 2 ANTIBODY SPECIFICITY: HLA CL2 AB RESULT: POSITIVE

## 2022-08-07 NOTE — Unmapped (Signed)
Confidential Psychological Therapy Session  [facility]     Patient Name: Elaine Koch  Medical Record Number: [id number]  Date of Service: August 07, 2022  Clinical Psychologist: Artemio Aly, PhD  Intern: None  Time Spent: 57 min of face-to-face counseling  CPT Procedure Code: 38756 (60 min psychotherapy with patient and/or family)  Therapy Type: Behavior Modifying/Cognitive Behavioral Therapy (CBT)  Purpose of Treatment: assess current MH needs, reduce depression symptoms, reduce anxiety symptoms, improve quality of life, improve coping, adjustment to chronic medical illness, safety planning     *This patient was not seen in person to minimize potential spread of COVID-19, protect patients/family/providers, and reduce PPE utilization. During this time, transplant psychology will be limiting person-to-person contact when possible.*      The patient reports they are physically located in [place] and is currently: at home. I conducted a audio/video visit. I spent  73m 19s on the video call with the patient. I spent an additional 30 minutes on pre- and post-visit activities on the date of service .        The limits of confidentiality and the purpose of the evaluation were reviewed. The patient was provided with a verbal description of the nature and purpose of the psychological evaluation. I also reviewed the referral source, specific referral question for this evaluation, foreseeable risks/discomforts, benefits, limits of confidentiality, and mandatory reporting requirements of this provider. The patient was given the opportunity to ask questions and receive answers about the present evaluation. Oral consent was provided by the patient.      This evaluation note may contain sensitive and confidential information regarding the patient???s psychosocial adjustment to living with a chronic medical condition. DO NOT share this information outside [facility] without written consent from the patient explicitly stating that mental health records may be released.      Referral/Relevant History:  Ms.  Elaine Koch is a very pleasant 37 y.o.  female who presents for cognitive behavioral therapy to clarify current MH needs and address symptoms of anxiety that were reported during her most recent post-transplant nephrology follow-up with Dr. Lucienne Minks Koch on 05/30/22. At that time, Dr. Carlene Koch notes that pt's care has been somewhat disjointed due to many ED visits without outpatient nephrology follow-up, and pt's [facility] Squibb, RN Pharmacist, hospital that pt had been expressing anxiety about labs draws and managing post-transplant care in addition to other life stressors. 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      [name] met with writer initially on 07/12/22, when she Koch symptoms consistent with PTSD, MDD Recurrent, and panic attacks, as well as frequent passive SI. She was currently engaged with a PHP through [facility] which had been beneficial, but as this was only a 21-day program, Clinical research associate agreed to continue to follow her for support until she is able to connect with a consistent, long-term provider. She was seen today for her 3rd appt with Clinical research associate.     Review of Symptoms/ROS: Deferred     Subjective:   [name] reported that it has been a week, mostly related to a lot of anxiety regarding upcoming kidney biopsy tomorrow. She noted that after seeing Dr. Carlene Koch last week, she found out that her creatinine was increasing (though other labs look stable), which has caused notable anxiety. [name] stress with caring for her son, who is not in camp this week so she has been with him 24/7; she also talked about some mild  stress with her new relationship, as her boyfriend travels frequently for work so she does not get to see him as often as she would like. 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 [name] noted that her mood improves when she completes tasks and remains active, but has been too overwhelmed to consistently accomplish things. No SI/HI was reported today.    Today we focused on discussing anxiety. [name] noted that when she gets anxious, she is shaky, hot, sweats, and feels like her anxiety is too intense to allow her to accomplish tasks. She described a few coping strategies when feeling anxious, like laughing with friends or eating ice cream, but otherwise felt like she does not have many ways to cope with anxiety. Psychoeducation was provided about the purpose of anxiety and the fight-or-flight response, and the CBT model was introduced as a model for coping with strong emotions. Specifically, we discussed behavioral coping strategies (problem-focused and emotion-focused coping), and relaxation strategies (diaphragmatic breathing and PMR). Insightfully, [name] noted that she has been learning that she does not need to eliminate all negative emotions, and that she can still live a meaningful life even with some degree of stress and sadness.      Objective:   Mental Status Exam:  Appearance: Appears stated age and Clean/Neat, tearful at times  Motor: No abnormal movements  Speech/Language: Normal rate, volume, tone, fluency  Mood: Depressed and Anxious  Affect: Anxious, Depressed, and Mood congruent  Thought Process: Logical, linear, clear, coherent, goal directed  Thought Content: Suicidal Ideation, passive, denies plan/intent; denies HI or A/VH  Perceptual Disturbances: Denies auditory and visual hallucinations, behavior not concerning for response to internal stimuli  Orientation: Oriented to person, place, time, and general circumstances  Attention: Able to fully attend without fluctuations in consciousness  Concentration: Able to fully concentrate and attend  Memory: Immediate, short-term, long-term, and recall grossly intact  Fund of Knowledge: Consistent with level of education and development  Insight: Fair  Judgment: Fair  Impulse Control: Fair        Assessment:  Ms.  Klasen participated well in this CBT session and exhibits good motivation towards treatment goals. Today she reported ongoing significant symptoms of depression and anxiety, with a particular increase in anxiety this week in response to potential health issues (need for kidney biopsy due to rising creatinine). No additional SI/HI was reported today. She was engaged in safety planning during initial session with Clinical research associate and reminded of crisis resources today; she presented to a behavioral health urgent care about two months ago for an episode of panic and SI and was encouraged to utilize crisis resources again if needed. Ms. Quant completed her 21-day PHP program, but has an intake appt schedule at [facility] for therapy on 7/17; she plans to reach out to her PHP program to determine if they may be able to continue prescribing her Lexapro. She was encouraged to reach out to writer if they are not so we can figure out an alternative for getting that prescribed.      Taken altogether, Ms. Rodwell appears to be experiencing severe symptoms of depression and anxiety at this time, though with good engagement in our treatment, she seems to be utilizing more effective coping strategies lately and has  reported some improvements in mood; anxiety appears to be best explained by recent trauma, as her symptoms do appear to be consistent with PTSD. She would benefit from consistent, active engagement in [facility] care to treat these symptoms, and is scheduled for an intake at [facility] on 08/28/22. Writer will attempt to continue to meet with her as a bridge until she establishes local MH care (preferably both medication management and therapy)      Focus on current treatment is assessing current MH needs, safety planning, treatment planning.      Focus on future sessions will include ongoing assessment and safety planning, introducing and practicing CBT-oriented coping strategies.     Adherence concerns: None reported today, though previous nephrology notes suggest some issues with obtaining labs consistently and disjointed care (more ED visits than outpatient nephrology follow-up).     Diagnostic Impression: Posttraumatic Stress Disorder; Major Depressive Disorder, Severe, Recurrent; Unspecified Anxiety Disorder (panic attacks)        Risk Assessment:  A suicide and violence risk assessment was performed as part of this evaluation. The patient is deemed to be at chronic elevated risk for self-harm/suicide given the following factors: recent bereavement, recent trauma, current diagnosis of depression, hopelessness, previous acts of self-harm, suicidal ideation or threats without a plan, chronic severe medical condition, chronic mental illness > 5 years, and past diagnosis of depression. The patient is deemed to be at chronic elevated risk for violence given the following factors: recent loss. These risk factors are mitigated by the following factors:no know access to weapons or firearms, no history of violence, motivation for treatment, supportive family, sense of responsibility to family and social supports, minor children living at home, presence of an available support system, employment or functioning in a structured work/academic setting, religious or spiritual prohibition to suicide/violence, and safe housing. There is no acute risk for suicide or violence at this time. The patient was educated about relevant modifiable risk factors including following recommendations for treatment of psychiatric illness and abstaining from substance abuse.               While future psychiatric events cannot be accurately predicted, the patient does not currently require  acute inpatient psychiatric care and does not currently meet [facility] involuntary commitment criteria.               Psychometric Testing: None administered today.          Plan:  Ms.  Alred has finished her PHP program at [facility], and has an intake scheduled at [facility] on 08/28/22. Writer will attempt to meet with her as a bridge to ensure she connects with a provider. Additionally, she was encouraged to reach out to writer if she is unable to get [facility] to consistently prescribe her Lexapro. Specific skills she was encouraged to practice include diaphragmatic breathing, PMR, and using behavioral coping strategies for anxiety.      Ms.  Derossett will return Thursday 08/22/22 at 11:00am via video.       Ms.  Apple was given this writer's contact information with confidential voice mail number and instructed to call 911 for emergencies.

## 2022-08-07 NOTE — Unmapped (Signed)
Patient called re: ultrasound guided renal biopsy with moderate sedation. Patient informed to arrive at 7am, have responsible party accompany to appointment who can stay all day, and remain NPO after midnight including all medications. Patient informed to bring all of their medications with them and they will be able to take once labs collected. Patient reports that they are not on anticoagulation. Has not had aspirin for months.  Procedure explained and all questions answered.

## 2022-08-08 ENCOUNTER — Other Ambulatory Visit (HOSPITAL_COMMUNITY): Payer: Medicare Other

## 2022-08-08 ENCOUNTER — Ambulatory Visit: Admit: 2022-08-08 | Discharge: 2022-08-09 | Payer: MEDICARE

## 2022-08-08 ENCOUNTER — Encounter: Admit: 2022-08-08 | Discharge: 2022-08-09 | Payer: MEDICARE | Attending: Nephrology | Primary: Nephrology

## 2022-08-08 DIAGNOSIS — Z940 Kidney transplant status: Principal | ICD-10-CM

## 2022-08-08 LAB — CBC
HEMATOCRIT: 25.3 % — ABNORMAL LOW (ref 34.0–44.0)
HEMATOCRIT: 23.7 % — ABNORMAL LOW (ref 34.0–44.0)
HEMOGLOBIN: 8.9 g/dL — ABNORMAL LOW (ref 11.3–14.9)
HEMOGLOBIN: 8 g/dL — ABNORMAL LOW (ref 11.3–14.9)
MEAN CORPUSCULAR HEMOGLOBIN CONC: 35.2 g/dL (ref 32.0–36.0)
MEAN CORPUSCULAR HEMOGLOBIN CONC: 33.9 g/dL (ref 32.0–36.0)
MEAN CORPUSCULAR HEMOGLOBIN: 38.9 pg — ABNORMAL HIGH (ref 25.9–32.4)
MEAN CORPUSCULAR HEMOGLOBIN: 37.3 pg — ABNORMAL HIGH (ref 25.9–32.4)
MEAN CORPUSCULAR VOLUME: 110.4 fL — ABNORMAL HIGH (ref 77.6–95.7)
MEAN CORPUSCULAR VOLUME: 109.9 fL — ABNORMAL HIGH (ref 77.6–95.7)
MEAN PLATELET VOLUME: 8.6 fL (ref 6.8–10.7)
MEAN PLATELET VOLUME: 8.6 fL (ref 6.8–10.7)
PLATELET COUNT: 154 10*9/L (ref 150–450)
PLATELET COUNT: 142 10*9/L — ABNORMAL LOW (ref 150–450)
RED BLOOD CELL COUNT: 2.29 10*12/L — ABNORMAL LOW (ref 3.95–5.13)
RED BLOOD CELL COUNT: 2.16 10*12/L — ABNORMAL LOW (ref 3.95–5.13)
RED CELL DISTRIBUTION WIDTH: 16.4 % — ABNORMAL HIGH (ref 12.2–15.2)
RED CELL DISTRIBUTION WIDTH: 16 % — ABNORMAL HIGH (ref 12.2–15.2)
WBC ADJUSTED: 2.2 10*9/L — ABNORMAL LOW (ref 3.6–11.2)
WBC ADJUSTED: 2 10*9/L — ABNORMAL LOW (ref 3.6–11.2)

## 2022-08-08 LAB — PROTIME-INR
INR: 1.05
PROTIME: 11.7 s (ref 9.9–12.6)

## 2022-08-08 LAB — BASIC METABOLIC PANEL
ANION GAP: 4 mmol/L — ABNORMAL LOW (ref 5–14)
BLOOD UREA NITROGEN: 29 mg/dL — ABNORMAL HIGH (ref 9–23)
BUN / CREAT RATIO: 13
CALCIUM: 9.6 mg/dL (ref 8.7–10.4)
CHLORIDE: 111 mmol/L — ABNORMAL HIGH (ref 98–107)
CO2: 28 mmol/L (ref 20.0–31.0)
CREATININE: 2.22 mg/dL — ABNORMAL HIGH
EGFR CKD-EPI (2021) FEMALE: 29 mL/min/{1.73_m2} — ABNORMAL LOW (ref >=60)
GLUCOSE RANDOM: 83 mg/dL (ref 70–99)
POTASSIUM: 4.4 mmol/L (ref 3.4–4.8)
SODIUM: 143 mmol/L (ref 135–145)

## 2022-08-08 LAB — APTT
APTT: 34.7 s (ref 24.8–38.4)
HEPARIN CORRELATION: 0.2

## 2022-08-08 LAB — HCG QUANTITATIVE, BLOOD: GONADOTROPIN, CHORIONIC (HCG) QUANT: <2.6 m[IU]/mL

## 2022-08-08 MED ADMIN — acetaminophen (TYLENOL) 500 MG tablet: ORAL | @ 16:00:00 | Stop: 2022-08-08

## 2022-08-08 MED ADMIN — midazolam (VERSED) injection: INTRAVENOUS | @ 13:00:00 | Stop: 2022-08-08

## 2022-08-08 MED ADMIN — acetaminophen (TYLENOL) tablet 1,000 mg: 1000 mg | ORAL | @ 16:00:00 | Stop: 2022-08-08

## 2022-08-08 MED ADMIN — fentaNYL (PF) (SUBLIMAZE) injection: INTRAVENOUS | @ 13:00:00 | Stop: 2022-08-08

## 2022-08-08 MED ADMIN — lidocaine (XYLOCAINE) 10 mg/mL (1 %) injection: @ 13:00:00 | Stop: 2022-08-08

## 2022-08-08 NOTE — Unmapped (Signed)
Assessment/Plan:    [name] is a 37 y.o. female who will undergo Ultrasound-guided transplant kidney biopsy    1. Indications and risks/benefits of procedure reviewed with patient.    2. Consent signed and present on patient's charge.   3. No cardiopulmonary or other medical contraindications present therefore will proceed with biopsy. The patient requested that a pregnancy test be added to her blood work.  4. Surgical pathology has been consulted  5. BUN is 29 today, so the patient will not receive DDAVP    CC: [facility] Biopsy Indication: Assess for Transplant Rejection    HPI: [name] is a 37 y.o. female who will undergo Ultrasound-guided transplant kidney biopsy with moderate sedation. Currently feeling at baseline without recent changes in health. Pt denies dysuria, urinary urgency, urinary frequency, fevers. Pt not currently taking anticoagulant/antiplatelet agents. Pt NPO since last night. Pt denies any history of complications related to anesthesia. Pt denies any prior reactions to opioid or benzodiazepines.     Allergies:  Iodinated contrast media, Nickel, Propranolol, Eye irrigating solution [ophthalmic irrigation solution], Iodine, Naltrexone, and Uni-cortrom    Medications:   Prior to Admission medications    Medication Sig Start Date End Date Taking? Authorizing Provider   azathioprine (IMURAN) 50 mg tablet Take 2 tablets (100 mg total) by mouth daily 01/17/22 01/17/23 Yes True, Posey Boyer, MD   escitalopram oxalate (LEXAPRO) 5 MG tablet Take 1 tablet (5 mg total) by mouth daily. 08/01/22 10/30/22 Yes True, Posey Boyer, MD   simethicone (GAS-X) 180 mg capsule Take 1 capsule (180 mg total) by mouth every six (6) hours as needed for flatulence. 05/30/22  Yes True, Posey Boyer, MD   sodium bicarbonate 650 mg tablet Take 2 tablets (1,300 mg total) by mouth Three (3) times a day. 10/24/21 10/24/22 Yes True, Posey Boyer, MD   tacrolimus (ENVARSUS XR) 4 mg Tb24 extended release tablet Take 4 tablets (16 mg total) by mouth daily. 07/09/22 07/09/23 Yes True, Posey Boyer, MD   aspirin (ECOTRIN) 81 MG tablet Take 1 tablet (81 mg total) by mouth daily. 05/09/21 06/05/22  True, Posey Boyer, MD   dicyclomine (BENTYL) 10 mg capsule Take 1 capsule (10 mg total) by mouth Four (4) times a day (before meals and nightly).  Patient not taking: Reported on 08/08/2022 05/30/22 05/30/23  True, Posey Boyer, MD   metroNIDAZOLE (FLAGYL) 500 MG tablet Take 1 tablet (500 mg total) by mouth two (2) times a day.  Patient not taking: Reported on 08/08/2022 05/16/22   Colonel Bald, MD   NIFEdipine (PROCARDIA XL) 30 MG 24 hr tablet Take 1 tablet (30 mg total) by mouth daily. 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       Surgical History:  Past Surgical History:   Procedure Laterality Date    AV FISTULA PLACEMENT  2018    CESAREAN SECTION      PR COLONOSCOPY FLX DX W/COLLJ SPEC WHEN PFRMD N/A 02/14/2020    Procedure: COLONOSCOPY, FLEXIBLE, PROXIMAL TO SPLENIC FLEXURE; DIAGNOSTIC, W/WO COLLECTION SPECIMEN BY BRUSH OR WASH;  Surgeon: Carmon Ginsberg, MD;  [facility];  Service: Gastroenterology    PR COLONOSCOPY FLX DX W/COLLJ SPEC WHEN PFRMD N/A 06/19/2020    Procedure: COLONOSCOPY, FLEXIBLE, PROXIMAL TO SPLENIC FLEXURE; DIAGNOSTIC, W/WO COLLECTION SPECIMEN BY BRUSH OR WASH;  Surgeon: Carmon Ginsberg, MD;  Location: [facility]URES [facility];  Service: Gastroenterology    PR FECAL MICROBIOTA PREP INSTIL N/A 02/14/2020    Procedure: PREP W INSTILLATION FECAL MICROBIOTA, ANY METHOD;  Surgeon: Carmon Ginsberg, MD;  [facility]: [facility];  Service: Gastroenterology    PR INDUCED ABORTN BY DIL/EVAC N/A 03/20/2021    Procedure: DILATION AND EVACUATION - INDUCED;  Surgeon: Gaynelle Cage, MD;  [facility]: [facility];  Service: Family Planning    PR LAP,DIAGNOSTIC ABDOMEN N/A 11/17/2020    Procedure: DIAGNOSTIC AND OR OPERATIVE LAPAROSCOPY;  Surgeon: Estil Daft, MD;  Location: MAIN OR [facility];  Service: Womens Primary Gynecology    PR PREPARE FECAL MICROBIOTA FOR INSTILLATION  06/19/2020    Procedure: PREP FECAL MICROBIOTA FOR INSTILLATION, INCLUDING ASSESSMENT OF DONOR SPECIMEN;  Surgeon: Carmon Ginsberg, MD;  Location: [facility]URES [facility];  Service: Gastroenterology    PR SURG RX MISSED ABORTN,1ST TRI N/A 11/17/2020    Procedure: VACUUM ASPIRATION;  Surgeon: Estil Daft, MD;  Location: MAIN OR [facility];  Service: [facility] Primary Gynecology    PR TRANSPLANT ALLOGRAFT PANCREAS N/A 05/03/2019    Procedure: TRANSPLANTATION OF PANCREATIC ALLOGRAFT;  Surgeon: Leona Carry, MD;  Location: MAIN OR [facility];  Service: Transplant    PR TRANSPLANT,PREP CADAVER RENAL GRAFT N/A 05/03/2019    Procedure: [facility] STD PREP CAD DONR RENAL ALLOGFT PRIOR TO TRNSPLNT, INCL DISSEC/REM PERINEPH FAT, DIAPH/RTPER ATTAC;  Surgeon: Leona Carry, MD;  Location: MAIN OR [facility];  Service: Transplant    PR TRANSPLANT,PREP DONOR PANCREAS N/A 05/03/2019    Procedure: BACKBENCH STANDARD PREPARATION OF CADAVER DONOR PANCREAS ALLOGRAFT PRIOR TO TRANSPLANTATION;  Surgeon: Leona Carry, MD;  Location: MAIN OR [facility];  Service: Transplant    PR TRANSPLANTATION OF KIDNEY N/A 05/03/2019    Procedure: RENAL ALLOTRANSPLANTATION, IMPLANTATION OF GRAFT; WITHOUT RECIPIENT NEPHRECTOMY;  Surgeon: Leona Carry, MD;  Location: MAIN OR [facility];  Service: Transplant    TONSILLECTOMY         Social History:  Tobacco use:   reports that she has never smoked. She has never used smokeless tobacco.  Alcohol use:   reports no history of alcohol use.  Drug use:  reports no history of drug use.    Family History:  N/A    ROS:  10 systems reviewed and are negative unless otherwise mentioned in HPI    ASA Grade: ASA 2 - Patient with mild systemic disease with no functional limitations    PE:    Vitals:    08/08/22 0756   BP: 111/74   Pulse: 82   Resp: 18   Temp: 36.4 ??C (97.5 ??F)   SpO2: 99%     General:  well appearing female in NAD.  HEENT: conjunctival anicteric  Airway assessment: Class 2 - Can visualize soft palate and fauces, tip of uvula is obscured  Cardiovascular:  RRR nl s1/s2 no s3/s4, occasional soft systolic murmur at LUSB  Lungs: CTAB w/o adventitious sounds, no incr WOB  Extr: No LE edema bilat  Skin: no visible lesions or rashes to either lower quandrant, over transplant kidney  Psych: alert, engaged, appropriate mood and affect    Judeth Cornfield  [name], MD

## 2022-08-08 NOTE — Unmapped (Signed)
KIDNEY BIOPSY PROCEDURE NOTE    INDICATIONS:  Elevated creatinine    CONSENT/TIME OUT:    Risks, benefits, and alternatives were discussed with patient, risks including but not limited to blood loss requiring transfusion/procedural correction, loss of kidney or kidney function, infection, damage to surrounding structures, and death, as well as risks associated with sedation including but not limited to hypotension, bradycardia, and respiratory failure. Written informed consent was obtained prior to the procedure and is detailed in the medical record. Prior to the start of the procedure, a time out was taken and the identity of the patient was confirmed via name, medical record number and date of birth. The availability of the correct equipment was verified.     PROCEDURE: Ultrasound-guided percutaneous transplant kidney biopsy    The patient was given  2 mg of midazolam and 50 mcg of fentanyl during the procedure, and 6.5 mL lidocaine 1% were used for local anesthesia. Under ultrasound guidance, a 16G biopsy needle was inserted into the LLQ transplanted kidney for a total of 1 passes with 1 small core specimens obtained for analysis.      COMPLICATIONS:  After the first pass, bleeding was noted along the tract and pressure was held for 5 minutes. After holding pressure, a small hematoma was seen with a bleeding vessel. Pressure was held for another 5 minutes and the bleeding stopped. The hematoma was measured ~3x1cm. An abdominal binder was placed and should stay in place for 4 hours with bedrest during that time. A repeat ultrasound and CBC will be done in 3 hours.  The patient and caregiver were updated. I will contact [facility] to assess if able to schedule for IR biopsy.    Clyde Lundborg, MD

## 2022-08-08 NOTE — Unmapped (Signed)
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 Biopsy attempted with nephrology on 08/08/2022 but stopped given hematoma after first pass and poor windows on subsequent ultrasound.

## 2022-08-08 NOTE — Unmapped (Signed)
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 (SG)

## 2022-08-09 ENCOUNTER — Other Ambulatory Visit (HOSPITAL_COMMUNITY): Payer: Medicare Other | Admitting: Psychiatry

## 2022-08-09 DIAGNOSIS — F331 Major depressive disorder, recurrent, moderate: Secondary | ICD-10-CM | POA: Diagnosis not present

## 2022-08-09 DIAGNOSIS — F321 Major depressive disorder, single episode, moderate: Secondary | ICD-10-CM

## 2022-08-09 MED ORDER — ESCITALOPRAM OXALATE 5 MG PO TABS: 5.0000 mg | ORAL_TABLET | Freq: Every day | ORAL | 0 refills | Status: DC

## 2022-08-09 NOTE — Unmapped (Signed)
Left Message following up post renal biopsy, pt reminded to call the phone numbers printed on the AVS(After Visit Summary for any questions/concerns.

## 2022-08-09 NOTE — Patient Instructions (Signed)
D:  Patient completed virtual MH-IOP today.  A:  Discharge today.  Follow up with current [facility] therapist.  Patient will inform therapist that she is in need of a [facility] psychiatrist.  Return to work on 08-12-22; without any restrictions.  Strongly encourage support groups through [facility] [phone].  R:  Patient receptive.

## 2022-08-09 NOTE — Progress Notes (Signed)
Virtual Visit via Video Note   I connected with Peni K. Stofer on 08/09/22 at  9:00 AM EDT by a video enabled telemedicine application and verified that I am speaking with the correct person using two identifiers.   At orientation to the IOP program, Case Manager discussed the limitations of evaluation and management by telemedicine and the availability of in person appointments. The patient expressed understanding and agreed to proceed with virtual visits throughout the duration of the program.   Location:  Patient: Patient Home Provider: Clinical Home Office   History of Present Illness: MDD    Observations/Objective: Check In: Case Manager checked in with all participants to review discharge dates, insurance authorizations, work-related documents and needs from the treatment team regarding medications. Mileny stated needs and engaged in discussion.    Initial Therapeutic Activity: Counselor facilitated a check-in with Jaylissa to assess for safety, sobriety and medication compliance.  Counselor also inquired about Yizel's current emotional ratings, as well as any significant changes in thoughts, feelings or behavior since previous check in.  Lua presented for session on time and was alert, oriented x5, with no evidence or self-report of active SI/HI or A/V H.  Elenna reported compliance with medication and denied use of alcohol or illicit substances.  Shaniquia reported scores of 6/10 for depression, 8/10 for anxiety, and 8/10 for anger/irritability.  Jermesha denied any recent panic attacks.  Chelby reported that a recent success was attending therapy and medical appointments, although this forced her to miss group.  Cerena reported that a recent struggle was having an outburst due to stress, stating "Ever just going in for a routine checkup, I feel so anxious".  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         Second  Therapeutic Activity: Counselor covered topic of distress tolerance skills today.  Counselor utilized a DBT handout which explained how distressing situations don't always have quick solutions, so the only choice is to sit with uncomfortable emotions until they pass.  Counselor offered the IMPROVE acronym as a solution to this problem, which outlined various skills (i.e. Imagery, Meaning, Prayer, Relaxation, 'One thing in the moment', Vacation, and Encouragement) that could be explored in order to improve ability to tolerate discomfort.  Counselor tasked members with identifying personalized strategies for each category which could have been implemented to handle a recent challenge more effectively.  Intervention was effective, as evidenced by Antoine actively engaging in discussion on subject, reporting that going to the hospital for appointments can be distressing for her.  Josselyne was able to identify several strategies for handling a similar struggle in the future, including visualizing herself in a calm rainstorm during the summer, saying a prayer to her higher power for strength, or taking a walk in nature.  Bonnee stated "I could also tell myself 'I got this, you can do it''.    Assessment and Plan: Cicely has completed MHIOP and will be discharged today.  Counselor recommends adherence to crisis/safety plan, taking medications as prescribed, and following up with medical professionals if any issues arise.    Follow Up Instructions: Velvia was advised to call back or seek an in-person evaluation if the symptoms worsen or if the condition fails to improve as anticipated.   Collaboration of Care:   Medication Management AEB Dr. Eliseo Gum or Hillery Jacks, NP  Case Manager AEB Jeri Modena, CNA   Patient/Guardian was advised Release of Information must be obtained prior to any record release in order to collaborate their care with an outside provider. Patient/Guardian  was advised if they have not already done so to contact the registration department to sign all necessary forms in order for [place] to release information regarding their care.   Consent: Patient/Guardian gives verbal consent for treatment and assignment of benefits for services provided during this visit. Patient/Guardian expressed understanding and agreed to proceed.  I provided 165 minutes of non-face-to-face time during this encounter.   Noralee Stain, [place], LCAS 08/09/22
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
## 2022-08-12 ENCOUNTER — Other Ambulatory Visit (HOSPITAL_COMMUNITY): Payer: Medicare Other

## 2022-08-12 DIAGNOSIS — Z940 Kidney transplant status: Principal | ICD-10-CM

## 2022-08-12 DIAGNOSIS — D708 Other neutropenia: Principal | ICD-10-CM

## 2022-08-12 DIAGNOSIS — Z79899 Other long term (current) drug therapy: Principal | ICD-10-CM

## 2022-08-13 ENCOUNTER — Other Ambulatory Visit (HOSPITAL_COMMUNITY): Payer: Medicare Other

## 2022-08-13 NOTE — Unmapped (Signed)
Pt paged on call [facility] inquiring about pending admission for biopsy. Called Admitting/PLC to determine status of admission. [facility] is aware and bed should be available. They will call her to finalize the plan for arrival.

## 2022-08-13 NOTE — Unmapped (Signed)
08/13/22 TC-spoke w pt, attempted to move Renal Bx up, pt stated to leave appt as scheduled, she is working w MD. (SG)

## 2022-08-14 ENCOUNTER — Other Ambulatory Visit (HOSPITAL_COMMUNITY): Payer: Medicare Other

## 2022-08-14 LAB — CBC W/ DIFFERENTIAL
BANDED NEUTROPHILS ABSOLUTE COUNT: 0 10*3/uL (ref 0.0–0.1)
BASOPHILS ABSOLUTE COUNT: 0 10*3/uL (ref 0.0–0.2)
BASOPHILS RELATIVE PERCENT: 1 %
EOSINOPHILS ABSOLUTE COUNT: 0.4 10*3/uL (ref 0.0–0.4)
EOSINOPHILS RELATIVE PERCENT: 19 %
HEMATOCRIT: 22 % — ABNORMAL LOW (ref 34.0–46.6)
HEMOGLOBIN: 7.5 g/dL — ABNORMAL LOW (ref 11.1–15.9)
IMMATURE GRANULOCYTES: 1 %
LYMPHOCYTES ABSOLUTE COUNT: 0.5 10*3/uL — ABNORMAL LOW (ref 0.7–3.1)
LYMPHOCYTES RELATIVE PERCENT: 25 %
MEAN CORPUSCULAR HEMOGLOBIN CONC: 34.1 g/dL (ref 31.5–35.7)
MEAN CORPUSCULAR HEMOGLOBIN: 36.6 pg — ABNORMAL HIGH (ref 26.6–33.0)
MEAN CORPUSCULAR VOLUME: 107 fL — ABNORMAL HIGH (ref 79–97)
MONOCYTES ABSOLUTE COUNT: 0.2 10*3/uL (ref 0.1–0.9)
MONOCYTES RELATIVE PERCENT: 9 %
NEUTROPHILS ABSOLUTE COUNT: 0.9 10*3/uL — ABNORMAL LOW (ref 1.4–7.0)
NEUTROPHILS RELATIVE PERCENT: 45 %
PLATELET COUNT: 146 10*3/uL — ABNORMAL LOW (ref 150–450)
RED BLOOD CELL COUNT: 2.05 x10E6/uL — CL (ref 3.77–5.28)
RED CELL DISTRIBUTION WIDTH: 15.9 % — ABNORMAL HIGH (ref 11.7–15.4)
WHITE BLOOD CELL COUNT: 2.1 10*3/uL — CL (ref 3.4–10.8)

## 2022-08-14 LAB — URINALYSIS WITH MICROSCOPY
BILIRUBIN UA: NEGATIVE
BLOOD UA: NEGATIVE
GLUCOSE UA: NEGATIVE
KETONES UA: NEGATIVE
LEUKOCYTE ESTERASE UA: NEGATIVE
NITRITE UA: NEGATIVE
PH UA: 6 (ref 5.0–7.5)
SPECIFIC GRAVITY UA: 1.015 (ref 1.005–1.030)
UROBILINOGEN UA: 0.2 mg/dL (ref 0.2–1.0)

## 2022-08-14 LAB — MICROSCOPIC EXAMINATION
BACTERIA: NONE SEEN
CASTS: NONE SEEN /LPF
RBC URINE: NONE SEEN /HPF (ref 0–2)
WBC URINE: NONE SEEN /HPF (ref 0–5)

## 2022-08-14 LAB — RENAL FUNCTION PANEL
ALBUMIN: 4 g/dL (ref 3.9–4.9)
BLOOD UREA NITROGEN: 27 mg/dL — ABNORMAL HIGH (ref 6–20)
BUN / CREAT RATIO: 12 (ref 9–23)
CALCIUM: 9 mg/dL (ref 8.7–10.2)
CHLORIDE: 109 mmol/L — ABNORMAL HIGH (ref 96–106)
CO2: 21 mmol/L (ref 20–29)
CREATININE: 2.19 mg/dL — ABNORMAL HIGH (ref 0.57–1.00)
EGFR: 30 mL/min/{1.73_m2} — ABNORMAL LOW
GLUCOSE: 101 mg/dL — ABNORMAL HIGH (ref 70–99)
PHOSPHORUS, SERUM: 3.1 mg/dL (ref 3.0–4.3)
POTASSIUM: 4.6 mmol/L (ref 3.5–5.2)
SODIUM: 142 mmol/L (ref 134–144)

## 2022-08-14 LAB — PROTEIN / CREATININE RATIO, URINE
CREATININE URINE: 153.6 mg/dL
PROTEIN URINE: 7.8 mg/dL
PROTEIN/CREAT RATIO: 51 mg/g{creat} (ref 0–200)

## 2022-08-14 LAB — MAGNESIUM: MAGNESIUM: 1.6 mg/dL (ref 1.6–2.3)

## 2022-08-15 ENCOUNTER — Ambulatory Visit
Admit: 2022-08-15 | Discharge: 2022-08-20 | Disposition: A | Discharge status: Home / Self Care | Payer: MEDICARE | Admitting: Nephrology

## 2022-08-15 ENCOUNTER — Encounter
Admit: 2022-08-15 | Discharge: 2022-08-20 | Disposition: A | Discharge status: Home / Self Care | Payer: MEDICARE | Attending: Nephrology | Admitting: Nephrology

## 2022-08-15 LAB — TACROLIMUS LEVEL: TACROLIMUS BLOOD: 4.9 ng/mL (ref 2.0–20.0)

## 2022-08-15 NOTE — Unmapped (Signed)
Patient underwent US guided renal biopsy 08/08/22 that was complicated by bleeding. Were unable to get sample. [facility]IR could not do outpatient biopsy until 09/13/22, which I felt was too long to wait with concern for rejection.     Her baseline creatinine had been in the mid 1s, then the high 1s. Went up to 2/4 on 08/01/22, which was when we initially attempted the biopsy. Fortunately creatinine has not continued to rise, has been 2.22 on 08/08/22 and 2.19 on 08/13/22, which is why I have not treated her empirically.    Plan to admit to hospital for inpatient renal biopsy on Friday 08/16/22. Her last Hgb was 7.5 on 08/13/22, so she may need transfusion prior to the procedure. Will hopefully be able to get a preliminary read to determine if she needs to stay for treatment.    Lisbeth Ply, MD  Transplant Nephrology

## 2022-08-16 ENCOUNTER — Other Ambulatory Visit (HOSPITAL_COMMUNITY): Payer: Medicare Other

## 2022-08-16 LAB — MAGNESIUM: MAGNESIUM: 1.8 mg/dL (ref 1.6–2.6)

## 2022-08-16 LAB — COMPREHENSIVE METABOLIC PANEL
ALBUMIN: 3.7 g/dL (ref 3.4–5.0)
ALKALINE PHOSPHATASE: 48 U/L (ref 46–116)
ALT (SGPT): 9 U/L — ABNORMAL LOW (ref 10–49)
ANION GAP: 4 mmol/L — ABNORMAL LOW (ref 5–14)
AST (SGOT): 18 U/L (ref <=34)
BILIRUBIN TOTAL: 1.1 mg/dL (ref 0.3–1.2)
BLOOD UREA NITROGEN: 32 mg/dL — ABNORMAL HIGH (ref 9–23)
BUN / CREAT RATIO: 13
CALCIUM: 9.2 mg/dL (ref 8.7–10.4)
CHLORIDE: 110 mmol/L — ABNORMAL HIGH (ref 98–107)
CO2: 28 mmol/L (ref 20.0–31.0)
CREATININE: 2.4 mg/dL — ABNORMAL HIGH
EGFR CKD-EPI (2021) FEMALE: 27 mL/min/{1.73_m2} — ABNORMAL LOW (ref >=60)
GLUCOSE RANDOM: 99 mg/dL (ref 70–179)
POTASSIUM: 4.6 mmol/L (ref 3.4–4.8)
PROTEIN TOTAL: 7 g/dL (ref 5.7–8.2)
SODIUM: 142 mmol/L (ref 135–145)

## 2022-08-16 LAB — CBC W/ AUTO DIFF
BASOPHILS ABSOLUTE COUNT: 0 10*9/L (ref 0.0–0.1)
BASOPHILS RELATIVE PERCENT: 1.4 %
EOSINOPHILS ABSOLUTE COUNT: 0.4 10*9/L (ref 0.0–0.5)
EOSINOPHILS RELATIVE PERCENT: 18.4 %
HEMATOCRIT: 23 % — ABNORMAL LOW (ref 34.0–44.0)
HEMOGLOBIN: 8 g/dL — ABNORMAL LOW (ref 11.3–14.9)
LYMPHOCYTES ABSOLUTE COUNT: 0.6 10*9/L — ABNORMAL LOW (ref 1.1–3.6)
LYMPHOCYTES RELATIVE PERCENT: 31.1 %
MEAN CORPUSCULAR HEMOGLOBIN CONC: 34.6 g/dL (ref 32.0–36.0)
MEAN CORPUSCULAR HEMOGLOBIN: 37.9 pg — ABNORMAL HIGH (ref 25.9–32.4)
MEAN CORPUSCULAR VOLUME: 109.3 fL — ABNORMAL HIGH (ref 77.6–95.7)
MEAN PLATELET VOLUME: 8.5 fL (ref 6.8–10.7)
MONOCYTES ABSOLUTE COUNT: 0.1 10*9/L — ABNORMAL LOW (ref 0.3–0.8)
MONOCYTES RELATIVE PERCENT: 6.9 %
NEUTROPHILS ABSOLUTE COUNT: 0.9 10*9/L — ABNORMAL LOW (ref 1.8–7.8)
NEUTROPHILS RELATIVE PERCENT: 42.2 %
PLATELET COUNT: 148 10*9/L — ABNORMAL LOW (ref 150–450)
RED BLOOD CELL COUNT: 2.11 10*12/L — ABNORMAL LOW (ref 3.95–5.13)
RED CELL DISTRIBUTION WIDTH: 16.1 % — ABNORMAL HIGH (ref 12.2–15.2)
WBC ADJUSTED: 2.1 10*9/L — ABNORMAL LOW (ref 3.6–11.2)

## 2022-08-16 LAB — CBC
HEMATOCRIT: 25.8 % — ABNORMAL LOW (ref 34.0–44.0)
HEMOGLOBIN: 8.8 g/dL — ABNORMAL LOW (ref 11.3–14.9)
MEAN CORPUSCULAR HEMOGLOBIN CONC: 34.3 g/dL (ref 32.0–36.0)
MEAN CORPUSCULAR HEMOGLOBIN: 37.5 pg — ABNORMAL HIGH (ref 25.9–32.4)
MEAN CORPUSCULAR VOLUME: 109.2 fL — ABNORMAL HIGH (ref 77.6–95.7)
MEAN PLATELET VOLUME: 8.2 fL (ref 6.8–10.7)
PLATELET COUNT: 155 10*9/L (ref 150–450)
RED BLOOD CELL COUNT: 2.36 10*12/L — ABNORMAL LOW (ref 3.95–5.13)
RED CELL DISTRIBUTION WIDTH: 16.3 % — ABNORMAL HIGH (ref 12.2–15.2)
WBC ADJUSTED: 2.2 10*9/L — ABNORMAL LOW (ref 3.6–11.2)

## 2022-08-16 LAB — IRON PANEL
IRON SATURATION: 32 % (ref 20–55)
IRON: 70 ug/dL
TOTAL IRON BINDING CAPACITY: 221 ug/dL — ABNORMAL LOW (ref 250–425)

## 2022-08-16 LAB — PROTIME-INR
INR: 1.05
PROTIME: 11.7 s (ref 9.9–12.6)

## 2022-08-16 LAB — APTT
APTT: 36.7 s (ref 24.8–38.4)
HEPARIN CORRELATION: 0.2

## 2022-08-16 LAB — VITAMIN B12: VITAMIN B-12: 565 pg/mL (ref 211–911)

## 2022-08-16 LAB — FERRITIN: FERRITIN: 305.4 ng/mL — ABNORMAL HIGH

## 2022-08-16 LAB — PHOSPHORUS: PHOSPHORUS: 4.1 mg/dL (ref 2.4–5.1)

## 2022-08-16 LAB — RETICULOCYTES
RETICULOCYTE ABSOLUTE COUNT: 20.2 10*9/L — ABNORMAL LOW (ref 23.0–100.0)
RETICULOCYTE COUNT PCT: 0.97 % (ref 0.50–2.17)

## 2022-08-16 LAB — TACROLIMUS LEVEL, TROUGH: TACROLIMUS, TROUGH: 5.1 ng/mL (ref 5.0–15.0)

## 2022-08-16 LAB — FOLATE: FOLATE: 5.9 ng/mL (ref >=5.4)

## 2022-08-16 MED ADMIN — acetaminophen (TYLENOL) tablet 650 mg: 650 mg | ORAL | @ 19:00:00

## 2022-08-16 MED ADMIN — lidocaine-EPINEPHrine (XYLOCAINE W/EPI) 1 %-1:100,000 injection: @ 16:00:00 | Stop: 2022-08-16

## 2022-08-16 MED ADMIN — tacrolimus (ENVARSUS XR) extended release tablet 16 mg: 16 mg | ORAL | @ 13:00:00 | Stop: 2022-08-16

## 2022-08-16 MED ADMIN — fentaNYL (PF) (SUBLIMAZE) injection: INTRAVENOUS | @ 16:00:00 | Stop: 2022-08-16

## 2022-08-16 MED ADMIN — sodium bicarbonate tablet 1,300 mg: 1300 mg | ORAL | @ 18:00:00

## 2022-08-16 MED ADMIN — midazolam (VERSED) injection: INTRAVENOUS | @ 16:00:00 | Stop: 2022-08-16

## 2022-08-16 MED ADMIN — sodium chloride (NS) 0.9 % infusion: INTRAVENOUS | @ 16:00:00 | Stop: 2022-08-16

## 2022-08-16 MED ADMIN — diazePAM (VALIUM) tablet 5 mg: 5 mg | ORAL | @ 15:00:00 | Stop: 2022-08-16

## 2022-08-16 MED ADMIN — sodium bicarbonate tablet 1,300 mg: 1300 mg | ORAL | @ 13:00:00

## 2022-08-16 NOTE — Unmapped (Addendum)
37 y.o. 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    C/f Renal Transplant Rejection - S/p kidney-pancreas transplant (05/03/19) - Worsening renal function   Follows with Dr. Carlene Coria (Transplant Nephrology). S/p kidney & pancreas transplant in 2021 for Type 1 diabetes. Found to have rise in Cr from baseline 1.5 to 2.4 on 08/01/22 without any symptoms concerning for rejection. Transplant kidney biopsy obtained by [facility] on 7/5. 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 Evidence of hematuria on UA 7/7, likely secondary to renal biopsy. Patient started on IVIG overnight 7/6-7/7, but had significant full body pain reaction to IVIG, even after stopping and restarting with premedication. So, IVIG held (did not receive full dose). Solumedrol 500 mg IV x3 given 7/6-7/8. Rituximab given 7/7 and tolerated well. Transitioned to PO prednisone 10 mg (per recs from Dr. Carlene Coria). Continued on home immunosuppression (tacrolimus, azathioprine). Started on prophylactic Bactrim and Valcyte. Mild increase in Cr to 2.31 (from 2.19) at the time of discharge. Plan to follow up outpatient with Transplant Nephrology with labs in two days.     Headache - History of migraines   Patient developed a bilateral frontal headache overnight 7/7 with one associated episode of emesis (non-bloody), that did not respond to oxy 5 mg. Treated with triptan x2, compazine, and IV magnesium. Headache resolved on 7/9.     Macrocytic Anemia  Labs notable for MCV 107, with azathioprine as a potential contributing factor. B12 and folate are normal. High ferritin (305), low TIBC (221) c/w anemia of chronic inflammation. Hgb stable around baseline 8-9.     Chronic:   Hx Type 1 diabetes: Now cured, s/p pancreas transplant in 2022. BMP nl.   Depression: Continued home Lexapro 5 mg

## 2022-08-16 NOTE — Unmapped (Signed)
Tacrolimus Therapeutic Monitoring Pharmacy Note    Elaine Koch is a 37 y.o. female continuing tacrolimus.     Indication:  Kidney and pancreas transplant      Date of Transplant:  2021       Prior Dosing Information: Home regimen Envarsus 16 mg daily      Source(s) of information used to determine prior to admission dosing: Home Medication List, Clinic Note, or Fill HIstory    Goals:  Therapeutic Drug Levels  Tacrolimus trough goal: 8-10 ng/mL    Additional Clinical Monitoring/Outcomes  Monitor renal function (SCr and urine output) and liver function (LFTs)  Monitor for signs/symptoms of adverse events (e.g., hyperglycemia, hyperkalemia, hypomagnesemia, hypertension, headache, tremor)    Results:   Tacrolimus level:  5.1 ng/mL, drawn appropriately    Pharmacokinetic Considerations and Significant Drug Interactions:  Concurrent hepatotoxic medications: None identified  Concurrent CYP3A4 substrates/inhibitors: None identified  Concurrent nephrotoxic medications: None identified    Assessment/Plan:  Recommendedation(s)  Increase to envarsus 17 mg daily    Follow-up  Daily levels .   A pharmacist will continue to monitor and recommend levels as appropriate    Please page service pharmacist with questions/clarifications.    [place] M Mckyle Solanki, PharmD

## 2022-08-16 NOTE — Unmapped (Signed)
VIR Post-Procedure Note    Procedure Name: Nonfocal renal transplant biopsy    Pre-Op Diagnosis & indication: Concern for rejection    Post-Op Diagnosis: Same as pre-operative diagnosis    VIR Attending: Everett Graff, MD, MD    VIR Fellow/Resident/PA/NP: None    Time out: Prior to the procedure, a time out was performed with all team members present. During the time out, the patient, procedure and procedure site when applicable were verbally verified by the team members and Dr. Everett Graff, MD.    Description of procedure: Successful nonfocal renal transplant biopsy x2 with no complication.    Plan: Bedrest x2 hrs and Samples sent to pathology    Sedation:Moderate sedation    Estimated Blood Loss: Minimal  Complications: None    See detailed procedure note with images in PACS [facility]).    The patient tolerated the procedure well without incident or complication.    Interventional Radiologist: Everett Graff, MD, MD  Date/Time: 08/16/2022 12:12 PM

## 2022-08-16 NOTE — Unmapped (Signed)
VENOUS ACCESS TEAM PROCEDURE    Nurse request was placed for a PIV by Venous Access Team (VAT).  Patient was assessed at bedside for placement of a PIV. PPE were donned per protocol.  Access was obtained. Blood return noted.  Dressing intact and device well secured.  Flushed with normal saline.  See LDA for details.  Pt advised to inform RN of any s/s of discomfort at the PIV site.    Workup / Procedure Time:  30 minutes       care RN was notified.       Thank you,     Arlone Lenhardt A Daisey Caloca, RN Venous Access Team

## 2022-08-16 NOTE — Unmapped (Signed)
Nephrology (MEDB) History & Physical    Assessment & Plan:   Elaine Koch is a 37 y.o. 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 *        History of Kidney-Pancreas Transplant (05/03/19) - C/f Renal Transplant Rejection  Follows with Dr. Carlene Coria in transplant nephro. S/p kidney & pancreas transplant in 2021 for Type 1 diabetes. Patient with reported rise in creatinine from baseline of mid 1s to 2.4 on 08/01/22, where it has stabilized. 08/08/22 that was complicated by bleeding, and sample was not obtained. Reportedly making good urine and has not missed any immunosuppression. Given increased concern for transplant rejection, primary transplant nephrologist elected to have patient directly admitted for kidney biopsy.    - notify transplant neph about admission  - consult VIR; patient made NPO in case able to perform biopsy today  - Continue home tacrolimus 16 mg daily (goal level 8-10), daily tac levels   - Continue Azathioprine 50 mg BID (not on mycophenolate due to childbearing concerns)  - Continue Bicarb 1300mg  TID   - Daily BMP, strict I&O      Macrocytic Anemia  Hemoglobin of 8 on admission, within baseline range of 8-9. Likely related to CKD and azathioprine use. No overt signs of bleeding. Will continue to monitor.  - CBC daily     Hx Type 1 DM - Now cured, s/p pancreas transplant in 2022. BMP nl.     Chronic Problems:  Depression - Anxiety: Continue home Lexapro 5 mg daily    Issues Impacting Complexity of Management:  -The patient is at high risk of complications from kidney rejection, anemia      Checklist:  Diet: NPO  DVT PPx: Not Indicated - Padua Score <4  Code Status: Full Code  Dispo: Patient appropriate for Observation based on expectation at time of admission that period of observation will last less than two midnights    Team Contact Information:   Primary Team: Nephrology (MEDB)  Primary Resident: Marvis Moeller, MD  Resident's Pager: [phone] (Nephrology Intern - Tower)    Chief Concern:   Worsening renal function      Subjective:   Elaine Koch is a 37 y.o. 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        History obtained by resident Danella Deis Brieonna Crutcher form patient.       HPI:     Patient was recently found to have rising creatinine from baseline of ~1.5 to 2.4 in June.  She underwent US guided renal biopsy 08/08/22 that was complicated by bleeding, and sample was not obtained.  As VIR could not do outpatient biopsy until 09/13/22, primary transplant nephrologist elected for scheduled admission for kidney transplant rejection workup.  Patient reported feeling well, denied dysuria, oliguria/anuria, hematuria, CVA  tenderness, fevers, chills, abdominal pain.  She denied missing immunosuppressant doses.    Pertinent Surgical Hx  Past Surgical History:   Procedure Laterality Date    AV FISTULA PLACEMENT  2018    CESAREAN SECTION      PR COLONOSCOPY FLX DX W/COLLJ SPEC WHEN PFRMD N/A 02/14/2020    Procedure: COLONOSCOPY, FLEXIBLE, PROXIMAL TO SPLENIC FLEXURE; DIAGNOSTIC, W/WO COLLECTION SPECIMEN BY BRUSH OR WASH;  Surgeon: Carmon Ginsberg, MD;  Location: [facility];  Service: Gastroenterology    PR COLONOSCOPY FLX DX W/COLLJ SPEC WHEN PFRMD N/A 06/19/2020 Procedure: COLONOSCOPY, FLEXIBLE, PROXIMAL TO SPLENIC FLEXURE; DIAGNOSTIC, W/WO COLLECTION SPECIMEN BY BRUSH OR WASH;  Surgeon: Carmon Ginsberg, MD;  Location: [facility];  Service: Gastroenterology    PR FECAL MICROBIOTA PREP INSTIL N/A 02/14/2020    Procedure: PREP W INSTILLATION FECAL MICROBIOTA, ANY METHOD;  Surgeon: Carmon Ginsberg, MD;  [facility]: [facility];  Service: Gastroenterology    PR INDUCED ABORTN BY DIL/EVAC N/A 03/20/2021    Procedure: DILATION AND EVACUATION - INDUCED;  Surgeon: Gaynelle Cage, MD;  Location: [facility] OR [facility];  Service: Family Planning    PR LAP,DIAGNOSTIC ABDOMEN N/A 11/17/2020    Procedure: DIAGNOSTIC AND OR OPERATIVE LAPAROSCOPY;  Surgeon: Estil Daft, MD;  Location: MAIN OR [facility];  Service: Womens Primary Gynecology    PR PREPARE FECAL MICROBIOTA FOR INSTILLATION  06/19/2020    Procedure: PREP FECAL MICROBIOTA FOR INSTILLATION, INCLUDING ASSESSMENT OF DONOR SPECIMEN;  Surgeon: Carmon Ginsberg, MD;  [facility];  Service: Gastroenterology    PR SURG RX MISSED ABORTN,1ST TRI N/A 11/17/2020    Procedure: VACUUM ASPIRATION;  Surgeon: Estil Daft, MD;  Location: MAIN OR [facility];  Service: [facility] Primary Gynecology    PR TRANSPLANT ALLOGRAFT PANCREAS N/A 05/03/2019    Procedure: TRANSPLANTATION OF PANCREATIC ALLOGRAFT;  Surgeon: Leona Carry, MD;  Location: MAIN OR [facility];  Service: Transplant    PR TRANSPLANT,PREP CADAVER RENAL GRAFT N/A 05/03/2019    Procedure: [facility] STD PREP CAD DONR RENAL ALLOGFT PRIOR TO TRNSPLNT, INCL DISSEC/REM PERINEPH FAT, DIAPH/RTPER ATTAC;  Surgeon: Leona Carry, MD;  Location: MAIN OR [facility];  Service: Transplant    PR TRANSPLANT,PREP DONOR PANCREAS N/A 05/03/2019    Procedure: BACKBENCH STANDARD PREPARATION OF CADAVER DONOR PANCREAS ALLOGRAFT PRIOR TO TRANSPLANTATION;  Surgeon: Leona Carry, MD;  Location: MAIN OR [facility];  Service: Transplant    PR TRANSPLANTATION OF KIDNEY N/A 05/03/2019    Procedure: RENAL ALLOTRANSPLANTATION, IMPLANTATION OF GRAFT; WITHOUT RECIPIENT NEPHRECTOMY;  Surgeon: Leona Carry, MD;  Location: MAIN OR [facility];  Service: Transplant    TONSILLECTOMY           Pertinent Family Hx  Family History   Problem Relation Age of Onset    Diabetes Mother     Diabetes Father     Cancer Maternal Grandmother     Diabetes Maternal Grandfather     Diabetes Paternal Grandmother          Pertinent Social Hx   Social History     Socioeconomic History    Marital status: Single    Number of children: 1    Highest education level: Some college, no degree   Occupational History    Occupation: patient registration   Tobacco Use    Smoking status: Never    Smokeless tobacco: Never   Vaping Use    Vaping status: Never Used   Substance and  Sexual Activity    Alcohol use: Never    Drug use: Never    Sexual activity: Not Currently     Partners: Male     Social Determinants of Health     Financial Resource Strain: Medium Risk (03/30/2018)    Overall Financial Resource Strain (CARDIA)     Difficulty of Paying Living Expenses: Somewhat hard   Physical Activity: Insufficiently Active (04/20/2019)    Exercise Vital Sign     Days of Exercise per Week: 2 days     Minutes of Exercise per Session: 30 min   Stress: No Stress Concern Present (04/20/2019)    [employer] of Occupational Health - Occupational Stress Questionnaire     Feeling of Stress : Only a little   Social Connections: Unknown (04/20/2019)    Social Connection and Isolation Panel [NHANES]     Frequency of Communication with Friends and Family: Patient declined     Frequency of Social Gatherings with Friends and Family: Patient declined     Attends Religious Services: Patient declined     Database administrator or Organizations: Patient declined     Attends Engineer, structural: Patient declined     Marital Status: Patient declined        Allergies  Iodinated contrast media, Nickel, Propranolol, Eye irrigating solution [ophthalmic irrigation solution], Iodine, Naltrexone, and Uni-cortrom    I reviewed the Medication List. The current list is Accurate  Prior to Admission medications    Medication Dose, Route, Frequency   azathioprine (IMURAN) 50 mg tablet Take 2 tablets (100 mg total) by mouth daily   dicyclomine (BENTYL) 10 mg capsule 10 mg, Oral, 4 times a day (ACHS)   escitalopram oxalate (LEXAPRO) 5 MG tablet 5 mg, Oral, Daily (standard)   simethicone (GAS-X) 180 mg capsule 180 mg, Oral, Every 6 hours PRN   sodium bicarbonate 650 mg tablet 1,300 mg, Oral, 3 times a day (standard)   tacrolimus (ENVARSUS XR) 4 mg Tb24 extended release tablet 16 mg, Oral, Daily (standard)   aspirin (ECOTRIN) 81 MG tablet 81 mg, Oral, Daily (standard)   NIFEdipine (PROCARDIA XL) 30 MG 24 hr tablet 30 mg, Oral, Daily (standard)       Public relations account executive Maker:  Ms. Mcnorton currently has decisional capacity for healthcare decision-making and is able to designate a surrogate healthcare decision maker. Ms. Cassone designated healthcare decision maker(s) is/are [name] (the patient's parent) as denoted by stated patient preference.    Objective:   Physical Exam:  Temp:  [36.3 ??C (97.3 ??F)] 36.3 ??C (97.3 ??F)  Heart Rate:  [78] 78  Resp:  [18] 18  BP: (118)/(72) 118/72  SpO2:  [100 %] 100 %    Gen: NAD, converses, pleasant  Eyes: Sclera anicteric, EOMI grossly normal   HENT: Atraumatic, normocephalic  Neck: Trachea midline  Heart: RRR  Lungs: CTAB, no crackles or wheezes  Abdomen: Soft, NTND  Extremities: No edema, R arm fistula  Neuro: Grossly symmetric, non-focal    Skin:  No rashes, lesions on clothed exam  Psych: Alert, oriented

## 2022-08-16 NOTE — Unmapped (Signed)
Patient is alert & oriented. Vital signs checked. Due meds given. Kidney biopsy done. Dressing dry & intact LLQ Refused to check vital signs as per protocol. Pt. voided. Bed in low position with call bell within reach.      Problem: Adult Inpatient Plan of Care  Goal: Plan of Care Review  Outcome: Progressing  Goal: Patient-Specific Goal (Individualized)  Outcome: Progressing  Goal: Absence of Hospital-Acquired Illness or Injury  Outcome: Progressing  Intervention: Identify and Manage Fall Risk  Recent Flowsheet Documentation  Taken 08/16/2022 0720 by Lyndee Leo, RN  Safety Interventions:   fall reduction program maintained   lighting adjusted for tasks/safety   low bed  Goal: Optimal Comfort and Wellbeing  Outcome: Progressing  Goal: Readiness for Transition of Care  Outcome: Progressing  Goal: Rounds/Family Conference  Outcome: Progressing     Problem: Fall Injury Risk  Goal: Absence of Fall and Fall-Related Injury  Outcome: Progressing  Intervention: Promote Scientist, clinical (histocompatibility and immunogenetics) Documentation  Taken 08/16/2022 0720 by Lyndee Leo, RN  Safety Interventions:   fall reduction program maintained   lighting adjusted for tasks/safety   low bed     Problem: Comorbidity Management  Goal: Maintenance of Asthma Control  Outcome: Progressing  Goal: Maintenance of Behavioral Health Symptom Control  Outcome: Progressing  Goal: Maintenance of COPD Symptom Control  Outcome: Progressing  Goal: Blood Glucose Levels Within Targeted Range  Outcome: Progressing  Goal: Maintenance of Heart Failure Symptom Control  Outcome: Progressing  Goal: Blood Pressure in Desired Range  Outcome: Progressing  Goal: Maintenance of Osteoarthritis Symptom Control  Outcome: Progressing  Goal: Bariatric Home Regimen Maintained  Outcome: Progressing  Goal: Maintenance of Seizure Control  Outcome: Progressing

## 2022-08-16 NOTE — Unmapped (Signed)
[facility] - General Biopsy Consultation Note      Requesting Attending Physician: Sharlett Iles, MD  Service Requesting Consult: Nephrology (MDB)    Date of Service: 08/16/2022  Consulting Interventional Radiologist: Dr. Benjamine Mola    Subjective:       Biopsy Site:  transplant kidney (LLQ), non-targeted    HPI:  Elaine Koch is a 37 y.o. 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 Patient with reported rise in creatinine from baseline of mid 1s to 2.4 on 08/01/22, where it has stabilized (currently 2.4 on 7/5). Renal biopsy done by [place] on 08/08/22 that was complicated by bleeding, and sample was not obtained. Reportedly making good urine and has not missed any immunosuppression. Given increased concern for transplant rejection, primary transplant nephrologist elected to have patient directly admitted for kidney biopsy.      Patient seen in consultation at the request of primary care team for consideration for non-targeted renal biopsy. She is currently neutropenic to 2.1. Plt and INR within limits to proceed. She is afebrile, BP at 134-77.     Objective:      Pertinent Imaging:  US Renal Transplant from 3/13    Pertinent Laboratory Values:  WBC   Date Value Ref Range Status   08/16/2022 2.1 (L) 3.6 - 11.2 10*9/L Final   08/13/2022 2.1 (LL) 3.4 - 10.8 x10E3/uL Final     HGB   Date Value Ref Range Status   08/16/2022 8.0 (L) 11.3 - 14.9 g/dL Final   16/11/9602 7.5 (L) 11.1 - 15.9 g/dL Final     HCT   Date Value Ref Range Status   08/16/2022 23.0 (L) 34.0 - 44.0 % Final   08/13/2022 22.0 (L) 34.0 - 46.6 % Final     Platelet   Date Value Ref Range Status   08/16/2022 148 (L) 150 - 450 10*9/L Final   08/13/2022 146 (L) 150 - 450 x10E3/uL Final     INR   Date Value Ref Range Status   08/16/2022 1.05  Final Creatinine   Date Value Ref Range Status   08/16/2022 2.40 (H) 0.55 - 1.02 mg/dL Final   54/10/8117 1.47 (H) 0.57 - 1.00 mg/dL Final     Allergies:     Allergies   Allergen Reactions    Iodinated Contrast Media Other (See Comments), Rash and Swelling     Burning, warmth throughout body and tingling.  Throat swelling.  Treated with benadryl and symptoms improved.  Has not had contrast since then (2013 or 2014).    Burning    Nickel Rash    Propranolol Other (See Comments) and Swelling    Eye Irrigating Solution [Ophthalmic Irrigation Solution] Other (See Comments)     Contrast dye (name?) used in eyes caused hot feeling in face, reversed with Benadryl.     Iodine      Other reaction(s): Skin Rash    Naltrexone Rash    Uni-Cortrom Rash     Physical Exam:    Vitals:    08/16/22 0749   BP: 134/77   Pulse: 78   Resp: 18   Temp: 36.9 ??C (98.4 ??F)   SpO2: 97%     General: No apparent distress.  Lungs: Breathing comfortably on RA.  Heart:  Regular rate and rhythm.  Neuro: No obvious focal deficits.  ASA Grade: ASA 3 - Patient with moderate systemic disease with functional limitations  Airway assessment: Class 2 - Can visualize soft palate and fauces, tip of uvula is obscured    Assessment:     Elaine Koch is a 37 y.o. 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 Patient with reported rise in creatinine from baseline of mid 1s to 2.4 on 08/01/22, where it has stabilized (currently 2.4 on 7/5). Renal biopsy done by [place] on 08/08/22 that was complicated by bleeding, and sample was not obtained. Given increased concern for transplant rejection, team is requesting non-targeted renal biopsy of the transplanted kidney in the LLQ. Pertinent history, imaging and laboratory values in patient's medical record have been reviewed.     Plan/Recommendations:       - VIR recommends proceeding with biopsy of LLQ transplanted kidney, non-targeted with Ultrasound.  - Anticipated procedure date: 7/8-7/9  - Anticoagulation: No  If yes, type of anticoagulation:  n/a  - Patient is NPO today. Please keep her NPO  - Labs & BP OK to proceed  - Valium 5 mg PO ordered to be given on floor 30 minutes-1 hour pre-procedure    Informed Consent:  This procedure and sedation has been fully reviewed with the patient/patient???s authorized representative. The risks, benefits and alternatives including but not limited to bleeding, infection, damage to adjacent structures have been explained, and the patient/patient???s authorized representative has consented to the procedure. Consent obtained by Loni Beckwith, FNP, witnessed and scanned into patient's medical record.   --The patient will accept blood products in an emergent situation.  --The patient does not have a Do Not Resuscitate order in effect.    Loni Beckwith, FNP, August 16, 2022, 8:16 AM

## 2022-08-16 NOTE — Unmapped (Signed)
Pt alert and oriented. No prns given. Pt was direct admit from home. Admission questions completed. Pt on RA. NPO since midnight for possible renal biopsy; pt aware. Labs collected. Save right arm. Denies having any pain. Skin assessment completed by 2 nurses and charted. Free from falls. Bed low and locked. Call bell and side table within reach.     Problem: Adult Inpatient Plan of Care  Goal: Absence of Hospital-Acquired Illness or Injury  Intervention: Identify and Manage Fall Risk  Recent Flowsheet Documentation  Taken 08/16/2022 0025 by Barnett Applebaum' A, RN  Safety Interventions:   fall reduction program maintained   lighting adjusted for tasks/safety   low bed   nonskid shoes/slippers when out of bed  Intervention: Prevent Infection  Recent Flowsheet Documentation  Taken 08/16/2022 0025 by Barnett Applebaum' A, RN  Infection Prevention:   hand hygiene promoted   rest/sleep promoted   single patient room provided

## 2022-08-16 NOTE — Unmapped (Signed)
Tacrolimus Therapeutic Monitoring Pharmacy Note    Priscila Tercero is a 37 y.o. female continuing tacrolimus.     Indication:  Kidney and pancreas transplant      Date of Transplant:  2021       Prior Dosing Information: Home regimen Envarsus 16 mg daily      Source(s) of information used to determine prior to admission dosing: Home Medication List, Clinic Note, or Fill HIstory    Goals:  Therapeutic Drug Levels  Tacrolimus trough goal: 8-10 ng/mL    Additional Clinical Monitoring/Outcomes  Monitor renal function (SCr and urine output) and liver function (LFTs)  Monitor for signs/symptoms of adverse events (e.g., hyperglycemia, hyperkalemia, hypomagnesemia, hypertension, headache, tremor)    Results:   Tacrolimus level: Not applicable    Pharmacokinetic Considerations and Significant Drug Interactions:  Concurrent hepatotoxic medications: None identified  Concurrent CYP3A4 substrates/inhibitors: None identified  Concurrent nephrotoxic medications: None identified    Assessment/Plan:  Recommendedation(s)  Continue current regimen of Envarsus 16  mg daily    Follow-up  Daily levels .   A pharmacist will continue to monitor and recommend levels as appropriate    Please page service pharmacist with questions/clarifications.    Cephus Slater, PharmD

## 2022-08-17 LAB — BASIC METABOLIC PANEL
ANION GAP: 3 mmol/L — ABNORMAL LOW (ref 5–14)
BLOOD UREA NITROGEN: 30 mg/dL — ABNORMAL HIGH (ref 9–23)
BUN / CREAT RATIO: 15
CALCIUM: 9.2 mg/dL (ref 8.7–10.4)
CHLORIDE: 114 mmol/L — ABNORMAL HIGH (ref 98–107)
CO2: 26 mmol/L (ref 20.0–31.0)
CREATININE: 2.02 mg/dL — ABNORMAL HIGH
EGFR CKD-EPI (2021) FEMALE: 33 mL/min/{1.73_m2} — ABNORMAL LOW (ref >=60)
GLUCOSE RANDOM: 88 mg/dL (ref 70–179)
POTASSIUM: 5 mmol/L — ABNORMAL HIGH (ref 3.4–4.8)
SODIUM: 143 mmol/L (ref 135–145)

## 2022-08-17 LAB — TACROLIMUS LEVEL, TROUGH: TACROLIMUS, TROUGH: 5.4 ng/mL (ref 5.0–15.0)

## 2022-08-17 MED ADMIN — acetaminophen (TYLENOL) tablet 650 mg: 650 mg | ORAL | @ 22:00:00 | Stop: 2022-08-19

## 2022-08-17 MED ADMIN — methylPREDNISolone sodium succinate (PF) (SOLU-Medrol) 500 mg in sodium chloride (NS) 0.9 % 50 mL IVPB: 500 mg | INTRAVENOUS | @ 20:00:00

## 2022-08-17 MED ADMIN — diphenhydrAMINE (BENADRYL) capsule/tablet 25 mg: 25 mg | ORAL | @ 22:00:00 | Stop: 2022-08-19

## 2022-08-17 MED ADMIN — melatonin tablet 3 mg: 3 mg | ORAL | @ 22:00:00

## 2022-08-17 MED ADMIN — tacrolimus (ENVARSUS XR) extended release tablet 17 mg: 17 mg | ORAL | @ 12:00:00

## 2022-08-17 MED ADMIN — sodium bicarbonate tablet 1,300 mg: 1300 mg | ORAL

## 2022-08-17 MED ADMIN — sodium bicarbonate tablet 1,300 mg: 1300 mg | ORAL | @ 12:00:00

## 2022-08-17 MED ADMIN — sodium bicarbonate tablet 1,300 mg: 1300 mg | ORAL | @ 18:00:00

## 2022-08-17 MED ADMIN — azathioprine (IMURAN) tablet 100 mg: 100 mg | ORAL | @ 12:00:00

## 2022-08-17 MED ADMIN — acetaminophen (TYLENOL) tablet 650 mg: 650 mg | ORAL

## 2022-08-17 MED ADMIN — escitalopram oxalate (LEXAPRO) tablet 5 mg: 5 mg | ORAL

## 2022-08-17 MED ADMIN — immun glob G(IgG)-pro-IgA 0-50 (PRIVIGEN) 10 % intravenous solution 55 g: 1 g/kg | INTRAVENOUS | @ 22:00:00 | Stop: 2022-08-19

## 2022-08-17 NOTE — Unmapped (Signed)
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 Recommend:    Solumedrol 500 mg IV x 3 days, then place on prednisone 10 mg daily  IVIG 1 g/kg x 2 days  Rituximab 1 g x 1     Lisbeth Ply, MD

## 2022-08-17 NOTE — Unmapped (Signed)
Nephrology (MEDB) Progress Note    Assessment & Plan:   Elaine Koch is a 37 y.o. 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 Carlene Coria (Transplant Nephrology). Found to have asymptomatic rise in Cr from baseline of 1.5 -> 2.4 on 6/20, prompting a renal biopsy attempt on 6/27 c/b bleeding and inability to obtain a sample. Reports adherence with immunosuppression at home. Directly admitted for workup of suspected transplant rejection with a renal biopsy. Biopsy completed by VIR on 7/5.   - Renal biopsy pending   - Continue home immunosuppressants:    - Tacrolimus 16 mg daily (goal level 8-10), daily tac levels    - Azathioprine 50 mg BID (not on mycophenolate due to childbearing concerns)   - Continue Bicarb 1300 mg TID   - Daily BMP, strict I/Os     Macrocytic Anemia   Hgb 8 on admission, baseline 8-9.  Ferritin 300 and TSAT 32% inconsistent with iron deficiency.  Etiology likely CKD and azathioprine use.   - B12 and folate wnl   - CBC daily     Hx of Type 1 Diabetes   Now cured s/p pancreas transplant in 2022.     Chronic Problems  Depression - Anxiety: Continue Lexapro 5 mg daily     Issues Impacting Complexity of Management:  -The patient is at high risk of complications from renal transplant rejection    Daily Checklist:  Diet:  Low potassium diet   DVT PPx: Not Indicated - Padua Score <4  Electrolytes: No Repletion Needed  Code Status: Full Code  Dispo:  Continue floor care     Team Contact Information:   Primary Team: Nephrology (MEDB)  Resident's Pager: [phone] (Nephrology Senior Resident)    Interval History:   No acute events overnight. Denies nausea, vomiting, abdominal pain. She has not had a bowel movement since presentation to the hospital two days ago, but states she usually gets constipation in the hospital. Does not want to try Miralax.     All other systems were reviewed and are negative except as noted in the HPI    Objective:   Temp:  [36.4 ??C (97.5 ??F)-36.7 ??C (98.1 ??F)] 36.7 ??C (98.1 ??F)  Heart Rate:  [67-85] 73  Resp:  [10-18] 16  BP: (122-165)/(75-93) 141/81  SpO2:  [97 %-100 %] 97 %    Gen: NAD, converses   HENT: atraumatic, normocephalic  Heart: RRR  Lungs: CTAB, no crackles or wheezes  Abdomen: soft, NTND. No erythema, warmth, or swelling around biopsy site. Dressing in place.   Extremities: No edema     Tempie Hoist, MS4    I attest that I have reviewed the medical student note and that the components of the history of the present illness, the  physical exam, and the assessment and plan documented were performed by me or were performed in my presence by the student where I verified the documentation and performed (or re-performed) the exam and medical decision making. Mel Almond, MD

## 2022-08-17 NOTE — Unmapped (Signed)
Patient alert and oriented no c/o pain or sob. Strict intake and output. Received solumedrol iv tolerated well will start IVIG this evening. No questions.  Problem: Adult Inpatient Plan of Care  Goal: Plan of Care Review  Outcome: Progressing  Goal: Patient-Specific Goal (Individualized)  Outcome: Progressing  Goal: Absence of Hospital-Acquired Illness or Injury  Outcome: Progressing  Goal: Optimal Comfort and Wellbeing  Outcome: Progressing  Goal: Readiness for Transition of Care  Outcome: Progressing  Goal: Rounds/Family Conference  Outcome: Progressing     Problem: Comorbidity Management  Goal: Maintenance of Asthma Control  Outcome: Progressing  Goal: Maintenance of Behavioral Health Symptom Control  Outcome: Progressing  Goal: Maintenance of COPD Symptom Control  Outcome: Progressing  Goal: Blood Glucose Levels Within Targeted Range  Outcome: Progressing  Goal: Maintenance of Heart Failure Symptom Control  Outcome: Progressing  Goal: Blood Pressure in Desired Range  Outcome: Progressing  Goal: Maintenance of Osteoarthritis Symptom Control  Outcome: Progressing  Goal: Bariatric Home Regimen Maintained  Outcome: Progressing  Goal: Maintenance of Seizure Control  Outcome: Progressing

## 2022-08-17 NOTE — Unmapped (Signed)
Pt has been alert and oriented throughout the shift. VSS. C/o abdominal pain; treated with PRN tylenol. 650 ml urine output for the shift. Bed locked in the lowest position with two side rails up. Pt is free from fall/injury.  Problem: Adult Inpatient Plan of Care  Goal: Plan of Care Review  Outcome: Progressing  Goal: Patient-Specific Goal (Individualized)  Outcome: Progressing  Goal: Absence of Hospital-Acquired Illness or Injury  Outcome: Progressing  Intervention: Identify and Manage Fall Risk  Recent Flowsheet Documentation  Taken 08/16/2022 2000 by Leisa Lenz, RN  Safety Interventions:   low bed   environmental modification   fall reduction program maintained  Intervention: Prevent and Manage VTE (Venous Thromboembolism) Risk  Recent Flowsheet Documentation  Taken 08/16/2022 2035 by Leisa Lenz, RN  VTE Prevention/Management: ambulation promoted  Intervention: Prevent Infection  Recent Flowsheet Documentation  Taken 08/16/2022 2000 by Alan Mulder A, RN  Infection Prevention: hand hygiene promoted  Goal: Optimal Comfort and Wellbeing  Outcome: Progressing  Goal: Readiness for Transition of Care  Outcome: Progressing  Goal: Rounds/Family Conference  Outcome: Progressing     Problem: Fall Injury Risk  Goal: Absence of Fall and Fall-Related Injury  Outcome: Progressing  Intervention: Promote Scientist, clinical (histocompatibility and immunogenetics) Documentation  Taken 08/16/2022 2000 by Leisa Lenz, RN  Safety Interventions:   low bed   environmental modification   fall reduction program maintained     Problem: Comorbidity Management  Goal: Maintenance of Asthma Control  Outcome: Progressing  Goal: Maintenance of Behavioral Health Symptom Control  Outcome: Progressing  Goal: Maintenance of COPD Symptom Control  Outcome: Progressing  Goal: Blood Glucose Levels Within Targeted Range  Outcome: Progressing  Goal: Maintenance of Heart Failure Symptom Control  Outcome: Progressing  Goal: Blood Pressure in Desired Range  Outcome: Progressing  Goal: Maintenance of Osteoarthritis Symptom Control  Outcome: Progressing  Goal: Bariatric Home Regimen Maintained  Outcome: Progressing  Goal: Maintenance of Seizure Control  Outcome: Progressing

## 2022-08-18 LAB — URINALYSIS WITH MICROSCOPY
BILIRUBIN UA: NEGATIVE
GLUCOSE UA: NEGATIVE
KETONES UA: NEGATIVE
LEUKOCYTE ESTERASE UA: NEGATIVE
NITRITE UA: NEGATIVE
PH UA: 6 (ref 5.0–9.0)
RBC UA: >182 /HPF — ABNORMAL HIGH (ref <=4)
SPECIFIC GRAVITY UA: 1.014 (ref 1.003–1.030)
SQUAMOUS EPITHELIAL: 4 /HPF (ref 0–5)
UROBILINOGEN UA: <2
WBC UA: <1 /HPF (ref 0–5)

## 2022-08-18 LAB — CBC
HEMATOCRIT: 25.9 % — ABNORMAL LOW (ref 34.0–44.0)
HEMOGLOBIN: 9.3 g/dL — ABNORMAL LOW (ref 11.3–14.9)
MEAN CORPUSCULAR HEMOGLOBIN CONC: 36 g/dL (ref 32.0–36.0)
MEAN CORPUSCULAR HEMOGLOBIN: 39.4 pg — ABNORMAL HIGH (ref 25.9–32.4)
MEAN CORPUSCULAR VOLUME: 109.5 fL — ABNORMAL HIGH (ref 77.6–95.7)
MEAN PLATELET VOLUME: 8.1 fL (ref 6.8–10.7)
PLATELET COUNT: 158 10*9/L (ref 150–450)
RED BLOOD CELL COUNT: 2.37 10*12/L — ABNORMAL LOW (ref 3.95–5.13)
RED CELL DISTRIBUTION WIDTH: 16.1 % — ABNORMAL HIGH (ref 12.2–15.2)
WBC ADJUSTED: 2.1 10*9/L — ABNORMAL LOW (ref 3.6–11.2)

## 2022-08-18 LAB — BASIC METABOLIC PANEL
ANION GAP: 3 mmol/L — ABNORMAL LOW (ref 5–14)
BLOOD UREA NITROGEN: 23 mg/dL (ref 9–23)
BUN / CREAT RATIO: 12
CALCIUM: 9.8 mg/dL (ref 8.7–10.4)
CHLORIDE: 111 mmol/L — ABNORMAL HIGH (ref 98–107)
CO2: 27 mmol/L (ref 20.0–31.0)
CREATININE: 1.91 mg/dL — ABNORMAL HIGH
EGFR CKD-EPI (2021) FEMALE: 35 mL/min/{1.73_m2} — ABNORMAL LOW (ref >=60)
GLUCOSE RANDOM: 121 mg/dL (ref 70–179)
POTASSIUM: 4.7 mmol/L (ref 3.4–4.8)
SODIUM: 141 mmol/L (ref 135–145)

## 2022-08-18 LAB — TACROLIMUS LEVEL, TROUGH: TACROLIMUS, TROUGH: 8.6 ng/mL (ref 5.0–15.0)

## 2022-08-18 LAB — HIGH SENSITIVITY TROPONIN I - SINGLE
HIGH SENSITIVITY TROPONIN I: 6 ng/L (ref <=34)
HIGH SENSITIVITY TROPONIN I: 8 ng/L (ref <=34)

## 2022-08-18 LAB — CK: CREATINE KINASE TOTAL: 129 U/L

## 2022-08-18 LAB — PHOSPHORUS: PHOSPHORUS: 3.5 mg/dL (ref 2.4–5.1)

## 2022-08-18 LAB — MAGNESIUM: MAGNESIUM: 1.7 mg/dL (ref 1.6–2.6)

## 2022-08-18 MED ADMIN — HYDROmorphone (PF) injection Syrg 0.5 mg: .5 mg | INTRAVENOUS | @ 22:00:00 | Stop: 2022-08-18

## 2022-08-18 MED ADMIN — sodium bicarbonate tablet 1,300 mg: 1300 mg | ORAL | @ 13:00:00

## 2022-08-18 MED ADMIN — methocarbamol (ROBAXIN) tablet 500 mg: 500 mg | ORAL | @ 02:00:00

## 2022-08-18 MED ADMIN — methylPREDNISolone sodium succinate (PF) (SOLU-Medrol) 500 mg in sodium chloride (NS) 0.9 % 50 mL IVPB: 500 mg | INTRAVENOUS | @ 13:00:00

## 2022-08-18 MED ADMIN — tacrolimus (ENVARSUS XR) extended release tablet 17 mg: 17 mg | ORAL | @ 13:00:00

## 2022-08-18 MED ADMIN — azathioprine (IMURAN) tablet 100 mg: 100 mg | ORAL | @ 13:00:00

## 2022-08-18 MED ADMIN — sodium bicarbonate tablet 1,300 mg: 1300 mg | ORAL | @ 19:00:00

## 2022-08-18 MED ADMIN — acetaminophen (TYLENOL) tablet 650 mg: 650 mg | ORAL | @ 19:00:00 | Stop: 2022-08-18

## 2022-08-18 MED ADMIN — diphenhydrAMINE (BENADRYL) injection 25 mg: 25 mg | INTRAVENOUS | @ 02:00:00

## 2022-08-18 MED ADMIN — riTUXimab-abbs (TRUXIMA) 1,000 mg in sodium chloride (NS) 0.9 % 640 mL IVPB: 1000 mg | INTRAVENOUS | @ 20:00:00 | Stop: 2022-08-18

## 2022-08-18 MED ADMIN — lactated ringers bolus 500 mL: 500 mL | INTRAVENOUS | @ 04:00:00 | Stop: 2022-08-18

## 2022-08-18 MED ADMIN — diphenhydrAMINE (BENADRYL) capsule 50 mg: 50 mg | ORAL | @ 19:00:00 | Stop: 2022-08-18

## 2022-08-18 MED ADMIN — methocarbamol (ROBAXIN) tablet 500 mg: 500 mg | ORAL | @ 10:00:00

## 2022-08-18 MED ADMIN — sodium bicarbonate tablet 1,300 mg: 1300 mg | ORAL | @ 01:00:00

## 2022-08-18 MED ADMIN — acetaminophen (TYLENOL) tablet 650 mg: 650 mg | ORAL | @ 10:00:00

## 2022-08-18 MED ADMIN — HYDROmorphone (PF) injection Syrg 0.5 mg: .5 mg | INTRAVENOUS | @ 14:00:00 | Stop: 2022-08-18

## 2022-08-18 MED ADMIN — diphenhydrAMINE (BENADRYL) injection 25 mg: 25 mg | INTRAVENOUS | @ 10:00:00

## 2022-08-18 MED ADMIN — magnesium sulfate in D5W 1 gram/100 mL infusion 1 g: 1 g | INTRAVENOUS | @ 17:00:00 | Stop: 2022-08-18

## 2022-08-18 MED ADMIN — methocarbamol (ROBAXIN) tablet 500 mg: 500 mg | ORAL | @ 13:00:00 | Stop: 2022-08-18

## 2022-08-18 MED ADMIN — acetaminophen (TYLENOL) tablet 1,000 mg: 1000 mg | ORAL | @ 02:00:00 | Stop: 2022-08-17

## 2022-08-18 MED ADMIN — escitalopram oxalate (LEXAPRO) tablet 5 mg: 5 mg | ORAL | @ 01:00:00

## 2022-08-18 MED ADMIN — sodium chloride 0.9% (NS) bolus 500 mL: 500 mL | INTRAVENOUS | @ 13:00:00 | Stop: 2022-08-18

## 2022-08-18 MED ADMIN — diphenhydrAMINE (BENADRYL) capsule/tablet 25 mg: 25 mg | ORAL | @ 14:00:00 | Stop: 2022-08-18

## 2022-08-18 MED ADMIN — HYDROmorphone (PF) injection Syrg 0.5 mg: .5 mg | INTRAVENOUS | @ 03:00:00 | Stop: 2022-08-17

## 2022-08-18 NOTE — Unmapped (Signed)
Pt alert and oriented. IVIG infusion started by previous nurse; VS monitored. Premeds given before infusion. After last titration pt started c/o leg pain. IVIG stopped, VS collected and MD asked to come to bedside. MD assessed pt and placed in orders for tylenol, iv benadryl, and robaxin. MD instructed to wait 30 min then restart IVIG. Pt reassessed after 30 min and c/o leg and back pain. Nurse did not feel comfortable restarting IVIG. MD asked to come to bedside again. MD assessed pt, order iv dilaudid, and later added iv toradol and LR bolus. Dilaudid and IVFs given; toradol not given d/t pt's pain getting better. Nurse asked MD if wanted IVIG restarted or wait for dayteam; MD spoke with pharm to try and come up with better plan for infusion. Pharm and MD arrived to unit and spoke with nurse. Per MD and pharm give iv benadryl, tylenol, and robaxin. MD stated okay to give robaxin earlier. After premeds wait 30 min then restart IVIG infusing at 0.3; do not increase greater than 12ml/hr. Free from falls. Bed low and locked. Call bell and side table in reach.     Problem: Adult Inpatient Plan of Care  Goal: Plan of Care Review  Outcome: Progressing  Goal: Patient-Specific Goal (Individualized)  Outcome: Progressing  Goal: Absence of Hospital-Acquired Illness or Injury  Outcome: Progressing  Intervention: Identify and Manage Fall Risk  Recent Flowsheet Documentation  Taken 08/17/2022 1925 by Barnett Applebaum' A, RN  Safety Interventions:   fall reduction program maintained   lighting adjusted for tasks/safety   low bed   nonskid shoes/slippers when out of bed  Intervention: Prevent Infection  Recent Flowsheet Documentation  Taken 08/17/2022 1925 by Barnett Applebaum' A, RN  Infection Prevention:   hand hygiene promoted   rest/sleep promoted   single patient room provided  Goal: Optimal Comfort and Wellbeing  Outcome: Progressing  Goal: Readiness for Transition of Care  Outcome: Progressing  Goal: Rounds/Family Conference  Outcome: Progressing     Problem: Fall Injury Risk  Goal: Absence of Fall and Fall-Related Injury  Outcome: Progressing  Intervention: Promote Injury-Free Environment  Recent Flowsheet Documentation  Taken 08/17/2022 1925 by Barnett Applebaum' A, RN  Safety Interventions:   fall reduction program maintained   lighting adjusted for tasks/safety   low bed   nonskid shoes/slippers when out of bed     Problem: Comorbidity Management  Goal: Maintenance of Asthma Control  Outcome: Progressing  Goal: Maintenance of Behavioral Health Symptom Control  Outcome: Progressing  Goal: Maintenance of COPD Symptom Control  Outcome: Progressing  Goal: Blood Glucose Levels Within Targeted Range  Outcome: Progressing  Goal: Maintenance of Heart Failure Symptom Control  Outcome: Progressing  Goal: Blood Pressure in Desired Range  Outcome: Progressing  Goal: Maintenance of Osteoarthritis Symptom Control  Outcome: Progressing  Intervention: Maintain Osteoarthritis Symptom Control  Recent Flowsheet Documentation  Taken 08/17/2022 1925 by Barnett Applebaum' A, RN  Activity Management: up ad lib  Goal: Bariatric Home Regimen Maintained  Outcome: Progressing  Goal: Maintenance of Seizure Control  Outcome: Progressing     Problem: Acute Kidney Injury/Impairment  Goal: Fluid and Electrolyte Balance  Outcome: Progressing  Goal: Improved Oral Intake  Outcome: Progressing  Goal: Effective Renal Function  Outcome: Progressing

## 2022-08-18 NOTE — Unmapped (Signed)
Nephrology ([facility]) Progress Note    Assessment & Plan:   Elaine Koch is a 37 y.o. 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 Carlene Coria (Transplant Nephrology). Found to have asymptomatic rise in Cr from baseline of 1.5 -> 2.4 on 6/20, prompting a renal biopsy attempt on 6/27 c/b bleeding and inability to obtain a sample. Reports adherence with immunosuppression at home. Directly admitted for workup of suspected transplant rejection with a renal biopsy. Biopsy completed by [facility]IR on 7/5. 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 Patient started on IVIG overnight 7/6-7/7, but had significant full body pain reaction to IVIG, even after stopping and restarting with premedication.  -Treated IVIG-induced pain/reaction with tylenol, benadryl, robaxin, IVF, dilaudid. Hold IVIG.   -Rituximab 7/7 or 7/8, depending on patient condition/recovery from IVIG reaction.   -Solumedrol 500 mg IV x3 (7/6-7/8)  - Continue home immunosuppressants:    - Tacrolimus 16 mg daily (goal level 8-10), daily tac levels    - Azathioprine 50 mg BID (not on mycophenolate due to childbearing concerns)   - Continue Bicarb 1300 mg TID   - Daily BMP, strict I/Os     Chest Pain  Chest pain was first complaint of IVIG reaction on 7/7 am; this turned into more generalized pain. IVIG discontinued. EKG and troponin x1 negative. Low suspicion for ischemia, likely 2/2 IVIG.  -Hold IVIG  -Repeat troponin in pm     Macrocytic Anemia   Hgb 8 on admission, baseline 8-9.  Ferritin 300 and TSAT 32% inconsistent with iron deficiency.  Etiology likely CKD and azathioprine use.   - B12 and folate wnl   - CBC daily     Hx of Type 1 Diabetes   Now cured s/p pancreas transplant in 2022.     Hypomagnesemia.   Repleted 7/7.    Chronic Problems  Depression - Anxiety: Continue Lexapro 5 mg daily     Issues Impacting Complexity of Management:  -The patient is at high risk of complications from renal transplant rejection    Daily Checklist:  Diet:  Low potassium diet   DVT PPx: Not Indicated - Padua Score <4  Electrolytes: Potassium and Magnesium Repleted  Code Status: Full Code  Dispo:  Continue floor care     Team Contact Information:   Primary Team: Nephrology ([facility])  Resident's Pager: [phone] (Nephrology Senior Resident)  Resident: Percival Spanish, MD  Interval History:   Overnight (around 2030 hrs), patient experienced significant leg and then full body pain about halfway through the first bottle of IVIG. IVIG held, given tylenol, benadryl, robaxin, 500 L IVF, dilaudid. Symptoms resolved. Restarted IVIG around 0630 with tylenol/benadryl pretreatment. Patient again developed chest pain, leg pain, and full body pain/restlessness. Treated again similar to above  w/ robaxin, benadryl, IVF, dilaudid.    All other systems were reviewed and are negative except as noted in the HPI    Objective:   Temp:  [36.4 ??C (97.5 ??F)-36.9 ??C (98.4 ??F)] 36.7 ??C (98.1 ??F)  Heart Rate:  [66-91] 80  Resp:  [16-20] 20  BP: (107-169)/(63-91) 160/83  SpO2:  [96 %-100 %] 100 %    Gen: Patient is in significant pain from IVIG reaction, converses   HENT: atraumatic, normocephalic  Heart: RRR  Lungs: CTAB, no crackles or wheezes  Abdomen: soft, NTND. Extremities: No edema

## 2022-08-19 ENCOUNTER — Other Ambulatory Visit (HOSPITAL_COMMUNITY): Payer: Medicare Other

## 2022-08-19 DIAGNOSIS — Z940 Kidney transplant status: Principal | ICD-10-CM

## 2022-08-19 DIAGNOSIS — Z79899 Other long term (current) drug therapy: Principal | ICD-10-CM

## 2022-08-19 LAB — TACROLIMUS LEVEL, TROUGH: TACROLIMUS, TROUGH: 6.2 ng/mL (ref 5.0–15.0)

## 2022-08-19 LAB — CBC
HEMATOCRIT: 24.9 % — ABNORMAL LOW (ref 34.0–44.0)
HEMOGLOBIN: 8.7 g/dL — ABNORMAL LOW (ref 11.3–14.9)
MEAN CORPUSCULAR HEMOGLOBIN CONC: 35.1 g/dL (ref 32.0–36.0)
MEAN CORPUSCULAR HEMOGLOBIN: 38.6 pg — ABNORMAL HIGH (ref 25.9–32.4)
MEAN CORPUSCULAR VOLUME: 110 fL — ABNORMAL HIGH (ref 77.6–95.7)
MEAN PLATELET VOLUME: 8.9 fL (ref 6.8–10.7)
PLATELET COUNT: 176 10*9/L (ref 150–450)
RED BLOOD CELL COUNT: 2.27 10*12/L — ABNORMAL LOW (ref 3.95–5.13)
RED CELL DISTRIBUTION WIDTH: 16.3 % — ABNORMAL HIGH (ref 12.2–15.2)
WBC ADJUSTED: 2.8 10*9/L — ABNORMAL LOW (ref 3.6–11.2)

## 2022-08-19 LAB — BASIC METABOLIC PANEL
ANION GAP: 3 mmol/L — ABNORMAL LOW (ref 5–14)
BLOOD UREA NITROGEN: 29 mg/dL — ABNORMAL HIGH (ref 9–23)
BUN / CREAT RATIO: 13
CALCIUM: 9.7 mg/dL (ref 8.7–10.4)
CHLORIDE: 111 mmol/L — ABNORMAL HIGH (ref 98–107)
CO2: 28 mmol/L (ref 20.0–31.0)
CREATININE: 2.19 mg/dL — ABNORMAL HIGH
EGFR CKD-EPI (2021) FEMALE: 30 mL/min/{1.73_m2} — ABNORMAL LOW (ref >=60)
GLUCOSE RANDOM: 105 mg/dL (ref 70–179)
POTASSIUM: 4.5 mmol/L (ref 3.4–4.8)
SODIUM: 142 mmol/L (ref 135–145)

## 2022-08-19 LAB — MAGNESIUM: MAGNESIUM: 1.8 mg/dL (ref 1.6–2.6)

## 2022-08-19 LAB — CK: CREATINE KINASE TOTAL: 81 U/L

## 2022-08-19 LAB — PHOSPHORUS: PHOSPHORUS: 4.4 mg/dL (ref 2.4–5.1)

## 2022-08-19 MED ADMIN — diphenhydrAMINE (BENADRYL) injection 25 mg: 25 mg | INTRAVENOUS | @ 21:00:00

## 2022-08-19 MED ADMIN — rizatriptan (MAXALT-MLT) disintegrating tablet 5 mg: 5 mg | ORAL | @ 19:00:00 | Stop: 2022-08-19

## 2022-08-19 MED ADMIN — sodium bicarbonate tablet 1,300 mg: 1300 mg | ORAL | @ 01:00:00

## 2022-08-19 MED ADMIN — sulfamethoxazole-trimethoprim (BACTRIM) 400-80 mg tablet 80 mg of trimethoprim: 1 | ORAL | @ 15:00:00 | Stop: 2023-08-18

## 2022-08-19 MED ADMIN — ondansetron (ZOFRAN-ODT) disintegrating tablet 4 mg: 4 mg | ORAL | @ 05:00:00

## 2022-08-19 MED ADMIN — azathioprine (IMURAN) tablet 100 mg: 100 mg | ORAL | @ 14:00:00

## 2022-08-19 MED ADMIN — escitalopram oxalate (LEXAPRO) tablet 5 mg: 5 mg | ORAL | @ 01:00:00

## 2022-08-19 MED ADMIN — oxyCODONE (ROXICODONE) immediate release tablet 5 mg: 5 mg | ORAL | @ 05:00:00 | Stop: 2022-08-19

## 2022-08-19 MED ADMIN — methylPREDNISolone sodium succinate (PF) (SOLU-Medrol) 500 mg in sodium chloride (NS) 0.9 % 50 mL IVPB: 500 mg | INTRAVENOUS | @ 14:00:00 | Stop: 2022-08-19

## 2022-08-19 MED ADMIN — tacrolimus (ENVARSUS XR) extended release tablet 17 mg: 17 mg | ORAL | @ 14:00:00 | Stop: 2022-08-19

## 2022-08-19 MED ADMIN — acetaminophen (TYLENOL) tablet 650 mg: 650 mg | ORAL | @ 14:00:00

## 2022-08-19 MED ADMIN — rizatriptan (MAXALT-MLT) disintegrating tablet 5 mg: 5 mg | ORAL | @ 15:00:00 | Stop: 2022-08-19

## 2022-08-19 MED ADMIN — melatonin tablet 3 mg: 3 mg | ORAL | @ 01:00:00

## 2022-08-19 MED ADMIN — magnesium sulfate 2gm/50mL IVPB: 2 g | INTRAVENOUS | @ 14:00:00 | Stop: 2022-08-19

## 2022-08-19 MED ADMIN — acetaminophen (TYLENOL) tablet 650 mg: 650 mg | ORAL | @ 21:00:00

## 2022-08-19 MED ADMIN — valGANciclovir (VALCYTE) tablet 450 mg: 450 mg | ORAL | @ 15:00:00

## 2022-08-19 MED ADMIN — sodium chloride 0.9% (NS) bolus 1,000 mL: 1000 mL | INTRAVENOUS | @ 21:00:00 | Stop: 2022-08-19

## 2022-08-19 MED ADMIN — acetaminophen (TYLENOL) tablet 650 mg: 650 mg | ORAL | @ 03:00:00

## 2022-08-19 MED ADMIN — sodium bicarbonate tablet 1,300 mg: 1300 mg | ORAL | @ 19:00:00

## 2022-08-19 MED ADMIN — sodium bicarbonate tablet 1,300 mg: 1300 mg | ORAL | @ 14:00:00

## 2022-08-19 NOTE — Unmapped (Signed)
Nephrology ([facility]) Progress Note    Assessment & Plan:   Elaine Koch is a 37 y.o. 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 Carlene Coria (Transplant Nephrology). Found to have asymptomatic rise in Cr from baseline of 1.5 -> 2.4 on 6/20, prompting a renal biopsy (bleeding when attempted by U/s, so required adm for expedited procedure by VIR 7/5). Good adherence with immunosuppression at home. 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 Patient started on IVIG overnight 7/6-7/7, but had significant full body pain reaction to IVIG, even after stopping and attempting slower restart with premedication so this was aborted prior to completion. Did complete Rituximab 7/7.   - s/p Solumedrol 500 mg IV x3 (7/6-7/8)'  - Start PO Prednisone 10 mg daily   - Continue home immunosuppressants:    - Tacrolimus 17 mg daily (goal level 8-10), daily tac levels    - Azathioprine 50 mg BID (not on mycophenolate as she is not on contraception)    - Start Bactrim 400-80 mg three times weekly    - Start Valcyte 450 mg every other day   - Continue Bicarb 1300 mg TID   - Daily BMP, strict I/Os     Headache - History of migraine   Developed a bilateral frontal headache overnight 7/7 while receiving Ritux infusion with one associated episode of emesis (non-bloody), that did not respond to prn tylenol or oxy. Has a distant history of migraines. Also trialed triptan x2, compazine, and IV magnesium. Suspect she is having myalgias, HA, flu-like illness (initially including chest pain but cardiac w/u neg) as side effect of IVIG vs Ritux.   - supportive care   - IVIG added as intolerance to allergy section     Macrocytic Anemia   Hgb 8 on admission, baseline 8-9. Ferritin 300 and TSAT 32% inconsistent with iron deficiency. B12 and folate wnl. Etiology likely CKD and azathioprine use.   - CBC daily     Hypomagnesemia.   Repleted 7/7.    Chronic Problems  Depression - Anxiety: Continue Lexapro 5 mg daily. Followed by Psychologist from transplant clinic. Has counseling appointment on 7/11.   Hx of Type 1 Diabetes: Now cured s/p pancreas transplant in 2022.   Continuous murmur at RUSB and LUSB: Likely a referred murmur from RUE AV fistula. Continue to monitor.     Issues Impacting Complexity of Management:  -The patient is at high risk of complications from renal transplant rejection    Daily Checklist:  Diet:  Low potassium diet   DVT PPx: Not Indicated - Padua Score <4  Electrolytes: Potassium and Magnesium Repleted  Code Status: Full Code  Dispo:  Continue floor care  Team Contact Information:   Primary Team: Nephrology ([facility])  Resident's Pager: [phone] (Nephrology Senior Resident)  Resident: PD, MD  Interval History:     Overnight, started to develop a headache while receiving her Ritux infusion, followed by one episode of non-bloody emesis. She denies any vision changes, aura, weakness, chest pain, body aches, or nausea. Was given tylenol and oxy 5 mg x1 overnight, with no improvement in headache. Continues to have 8/10 bilateral, frontal headache. Last bowel movement was two days ago.     All other systems were reviewed and are negative except as noted in the HPI    Objective:   Temp:  [36.4 ??C (97.5 ??F)-37.1 ??C (98.8 ??F)] 37.1 ??C (98.8 ??F)  Heart Rate:  [72-94] 77  Resp:  [14-20] 18  BP: (131-153)/(74-92) 145/92  SpO2:  [93 %-100 %] 97 %    Gen: Uncomfortable due to headache, converses   HENT: atraumatic, normocephalic  Heart: RRR. Continuous, grade III murmur most prominent in RUSB, also heard LUSB  Lungs: CTAB, no crackles or wheezes  Abdomen: soft, NTND.   Extremities: No edema    Tempie Hoist, MS4    I attest that I have reviewed the medical student note and that the components of the history of the present illness, the physical exam, and the assessment and plan documented were performed by me or were performed in my presence by the student where I verified the documentation and performed (or re-performed) the exam and medical decision making. Mel Almond, MD

## 2022-08-19 NOTE — Unmapped (Signed)
Tacrolimus Therapeutic Monitoring Pharmacy Note    Elaine Koch is a 37 y.o. female continuing tacrolimus.     Indication:  Kidney and pancreas transplant      Date of Transplant:  2021       Prior Dosing Information: Home regimen Envarsus 16 mg daily      Source(s) of information used to determine prior to admission dosing: Home Medication List, Clinic Note, or Fill HIstory    Goals:  Therapeutic Drug Levels  Tacrolimus trough goal: 8-10 ng/mL    Additional Clinical Monitoring/Outcomes  Monitor renal function (SCr and urine output) and liver function (LFTs)  Monitor for signs/symptoms of adverse events (e.g., hyperglycemia, hyperkalemia, hypomagnesemia, hypertension, headache, tremor)    Results:   Tacrolimus level:  6.2 ng/mL, drawn appropriately    Pharmacokinetic Considerations and Significant Drug Interactions:  Concurrent hepatotoxic medications: None identified  Concurrent CYP3A4 substrates/inhibitors: None identified  Concurrent nephrotoxic medications: None identified    Assessment/Plan:  Recommendedation(s)  Increase to envarsus 18 mg daily    Follow-up  Daily levels .   A pharmacist will continue to monitor and recommend levels as appropriate    Please page service pharmacist with questions/clarifications.    [place] M Jillyan Plitt, PharmD

## 2022-08-19 NOTE — Unmapped (Signed)
CONFIDENTIAL PSYCHOLOGICAL DOCUMENTATION    Writer stopped by to check in with Ms. Delrosario, who is currently admitted for kidney biopsy due to concerns for acute rejection. She is s/p kidney/pancreas transplant on 05/03/19. Writer has been following Ms. Seelbach on an outpatient basis for MH support, as she has been reporting significant symptoms of depression, anxiety, and PTSD symptoms, most recently seen on 08/07/22. She was seen today to assess current coping and seen at bedside (initially with cousin Angelica Chessman present, and later alone when discussing MH symptoms).     Ms. Kostyk reported that she is concerned about this episode of rejection, and frustrated that she needed to come to the hospital for it when they were unable to obtain a sample while outpatient. She stated that after the biopsy she was started on high-dose steroids, which has resulted in her feeling poorly physically (mainly severe headaches); she expects to be discharged today and will be sent home on a lower dose of prednisone.     With regards to mood, Ms. Allston endorsed ongoing depression, particularly feeling down and frustrated about coming to the hospital feeling well and now being discharged feeling poorly. She was reminded of skills we practiced during our previous session, including diaphragmatic breathing and PMR, and encouraged to practice these even when in the hospital. Given that we are scheduled for an upcoming appt on 08/22/22, a full counseling session was not performed today, but she was reminded of this upcoming appt and encouraged to reach out if needed. She denied any active SI/HI today.     Theda Sers, PhD  Clinical Psychologist

## 2022-08-19 NOTE — Unmapped (Signed)
Pt alert and oriented. Zofran prn given x1 for nausea. Pt c/o headache during ritux; prn tylenol given, but was unsuccessful in relieving pain. Emesis episode x1 and pt stated HA has worsened; MD notified and asked if pt could have something else for pain. Oxycodone ordered. Ritux infusion completed; VS monitored. Save right arm. Free from falls. Bed low and locked. Call bell and side table in reach.     Problem: Adult Inpatient Plan of Care  Goal: Plan of Care Review  Outcome: Progressing  Goal: Patient-Specific Goal (Individualized)  Outcome: Progressing  Goal: Absence of Hospital-Acquired Illness or Injury  Outcome: Progressing  Intervention: Identify and Manage Fall Risk  Recent Flowsheet Documentation  Taken 08/18/2022 1930 by Barnett Applebaum' A, RN  Safety Interventions:   fall reduction program maintained   lighting adjusted for tasks/safety   low bed   nonskid shoes/slippers when out of bed  Intervention: Prevent Infection  Recent Flowsheet Documentation  Taken 08/18/2022 1930 by Barnett Applebaum' A, RN  Infection Prevention:   hand hygiene promoted   rest/sleep promoted   single patient room provided  Goal: Optimal Comfort and Wellbeing  Outcome: Progressing  Goal: Readiness for Transition of Care  Outcome: Progressing  Goal: Rounds/Family Conference  Outcome: Progressing     Problem: Fall Injury Risk  Goal: Absence of Fall and Fall-Related Injury  Outcome: Progressing  Intervention: Promote Injury-Free Environment  Recent Flowsheet Documentation  Taken 08/18/2022 1930 by Barnett Applebaum' A, RN  Safety Interventions:   fall reduction program maintained   lighting adjusted for tasks/safety   low bed   nonskid shoes/slippers when out of bed     Problem: Comorbidity Management  Goal: Maintenance of Asthma Control  Outcome: Progressing  Goal: Maintenance of Behavioral Health Symptom Control  Outcome: Progressing  Goal: Maintenance of COPD Symptom Control  Outcome: Progressing  Goal: Blood Glucose Levels Within Targeted Range  Outcome: Progressing  Goal: Maintenance of Heart Failure Symptom Control  Outcome: Progressing  Goal: Blood Pressure in Desired Range  Outcome: Progressing  Goal: Maintenance of Osteoarthritis Symptom Control  Outcome: Progressing  Intervention: Maintain Osteoarthritis Symptom Control  Recent Flowsheet Documentation  Taken 08/18/2022 1930 by Barnett Applebaum' A, RN  Activity Management:   back to bed   up ad lib  Goal: Bariatric Home Regimen Maintained  Outcome: Progressing  Goal: Maintenance of Seizure Control  Outcome: Progressing     Problem: Acute Kidney Injury/Impairment  Goal: Fluid and Electrolyte Balance  Outcome: Progressing  Goal: Improved Oral Intake  Outcome: Progressing  Goal: Effective Renal Function  Outcome: Progressing

## 2022-08-19 NOTE — Unmapped (Signed)
Patient had severe gen pain this morning after IVIG was restarted,Robaxin and Benadryl given with no relief, MD came by and reassessed the pt, Dilaudid IV mg given with total relief. Rituximab started, premedications given as ordered, pt started c/o headache and gen pain an hour after starting the infusion. Infusion was stopped ,she was seen by MD, Dialudid IV given again for 6/10 pain. Rituximab increased to 100mg  /hr per protocol. Patient is completely alert and orientedx4 the whole time, ambulates to the bathroom with no issue, call bell within reach.    Problem: Adult Inpatient Plan of Care  Goal: Plan of Care Review  Outcome: Not Progressing  Goal: Patient-Specific Goal (Individualized)  Outcome: Not Progressing  Goal: Absence of Hospital-Acquired Illness or Injury  Outcome: Not Progressing  Intervention: Identify and Manage Fall Risk  Recent Flowsheet Documentation  Taken 08/18/2022 0750 by Valentino Nose, RN  Safety Interventions:   fall reduction program maintained   low bed   no IV/BP/blood draw right arm   nonskid shoes/slippers when out of bed  Goal: Optimal Comfort and Wellbeing  Outcome: Not Progressing  Goal: Readiness for Transition of Care  Outcome: Not Progressing  Goal: Rounds/Family Conference  Outcome: Not Progressing     Problem: Fall Injury Risk  Goal: Absence of Fall and Fall-Related Injury  Outcome: Not Progressing  Intervention: Promote Injury-Free Environment  Recent Flowsheet Documentation  Taken 08/18/2022 0750 by Valentino Nose, RN  Safety Interventions:   fall reduction program maintained   low bed   no IV/BP/blood draw right arm   nonskid shoes/slippers when out of bed

## 2022-08-20 ENCOUNTER — Other Ambulatory Visit (HOSPITAL_COMMUNITY): Payer: Medicare Other

## 2022-08-20 LAB — CBC
HEMATOCRIT: 26.8 % — ABNORMAL LOW (ref 34.0–44.0)
HEMOGLOBIN: 9.4 g/dL — ABNORMAL LOW (ref 11.3–14.9)
MEAN CORPUSCULAR HEMOGLOBIN CONC: 35 g/dL (ref 32.0–36.0)
MEAN CORPUSCULAR HEMOGLOBIN: 38.6 pg — ABNORMAL HIGH (ref 25.9–32.4)
MEAN CORPUSCULAR VOLUME: 110.2 fL — ABNORMAL HIGH (ref 77.6–95.7)
MEAN PLATELET VOLUME: 8.8 fL (ref 6.8–10.7)
PLATELET COUNT: 172 10*9/L (ref 150–450)
RED BLOOD CELL COUNT: 2.43 10*12/L — ABNORMAL LOW (ref 3.95–5.13)
RED CELL DISTRIBUTION WIDTH: 16.2 % — ABNORMAL HIGH (ref 12.2–15.2)
WBC ADJUSTED: 2.4 10*9/L — ABNORMAL LOW (ref 3.6–11.2)

## 2022-08-20 LAB — BASIC METABOLIC PANEL
ANION GAP: 3 mmol/L — ABNORMAL LOW (ref 5–14)
BLOOD UREA NITROGEN: 26 mg/dL — ABNORMAL HIGH (ref 9–23)
BUN / CREAT RATIO: 11
CALCIUM: 9.3 mg/dL (ref 8.7–10.4)
CHLORIDE: 110 mmol/L — ABNORMAL HIGH (ref 98–107)
CO2: 30 mmol/L (ref 20.0–31.0)
CREATININE: 2.31 mg/dL — ABNORMAL HIGH
EGFR CKD-EPI (2021) FEMALE: 28 mL/min/{1.73_m2} — ABNORMAL LOW (ref >=60)
GLUCOSE RANDOM: 94 mg/dL (ref 70–179)
POTASSIUM: 4.3 mmol/L (ref 3.4–4.8)
SODIUM: 143 mmol/L (ref 135–145)

## 2022-08-20 LAB — MAGNESIUM: MAGNESIUM: 1.9 mg/dL (ref 1.6–2.6)

## 2022-08-20 LAB — PHOSPHORUS: PHOSPHORUS: 3.9 mg/dL (ref 2.4–5.1)

## 2022-08-20 LAB — TACROLIMUS LEVEL, TROUGH: TACROLIMUS, TROUGH: 6.4 ng/mL (ref 5.0–15.0)

## 2022-08-20 MED ORDER — TACROLIMUS XR 1 MG TABLET,EXTENDED RELEASE 24 HR
ORAL_TABLET | Freq: Every day | ORAL | 0 refills | 30 days
Start: 2022-08-20 — End: 2022-09-19

## 2022-08-20 MED ORDER — PREDNISONE 10 MG TABLET
ORAL_TABLET | Freq: Every day | ORAL | 0 refills | 30.00000 days | Status: CP
Start: 2022-08-20 — End: 2022-09-19
  Filled 2022-08-20: qty 30, 30d supply, fill #0

## 2022-08-20 MED ORDER — TACROLIMUS XR 4 MG TABLET,EXTENDED RELEASE 24 HR
ORAL_TABLET | Freq: Every day | ORAL | 11 refills | 30 days | Status: CP
Start: 2022-08-20 — End: 2023-08-20
  Filled 2022-08-20: qty 120, 30d supply, fill #0

## 2022-08-20 MED ADMIN — sodium bicarbonate tablet 1,300 mg: 1300 mg | ORAL | @ 13:00:00 | Stop: 2022-08-20

## 2022-08-20 MED ADMIN — melatonin tablet 3 mg: 3 mg | ORAL

## 2022-08-20 MED ADMIN — azathioprine (IMURAN) tablet 100 mg: 100 mg | ORAL | @ 13:00:00 | Stop: 2022-08-20

## 2022-08-20 MED ADMIN — escitalopram oxalate (LEXAPRO) tablet 5 mg: 5 mg | ORAL | @ 02:00:00

## 2022-08-20 MED ADMIN — tacrolimus (ENVARSUS XR) extended release tablet 18 mg: 18 mg | ORAL | @ 14:00:00 | Stop: 2022-08-20

## 2022-08-20 MED ADMIN — sodium bicarbonate tablet 1,300 mg: 1300 mg | ORAL | @ 20:00:00 | Stop: 2022-08-20

## 2022-08-20 MED ADMIN — sodium bicarbonate tablet 1,300 mg: 1300 mg | ORAL | @ 02:00:00

## 2022-08-20 MED ADMIN — predniSONE (DELTASONE) tablet 10 mg: 10 mg | ORAL | @ 13:00:00 | Stop: 2022-08-20

## 2022-08-20 MED FILL — ENVARSUS XR 1 MG TABLET,EXTENDED RELEASE: ORAL | 30 days supply | Qty: 60 | Fill #0

## 2022-08-20 NOTE — Unmapped (Signed)
PATIENT is alert/oriented x 4. Denies headache at this time. Keep the room dark. Allow patient to rest. Patient denies nausea. Walked to bath room to void. On room air : denies shortness of breath. SAVE THE RIGHT ARM(AV FISTULA). Continue to care per POC.  Problem: Adult Inpatient Plan of Care  Goal: Plan of Care Review  Outcome: Ongoing - Unchanged  Goal: Patient-Specific Goal (Individualized)  Outcome: Ongoing - Unchanged  Goal: Absence of Hospital-Acquired Illness or Injury  Outcome: Ongoing - Unchanged  Intervention: Prevent Skin Injury  Recent Flowsheet Documentation  Taken 08/19/2022 2000 by Glory Buff, RN  Positioning for Skin: Left  Device Skin Pressure Protection: adhesive use limited  Skin Protection: adhesive use limited  Intervention: Prevent and Manage VTE (Venous Thromboembolism) Risk  Recent Flowsheet Documentation  Taken 08/19/2022 2000 by Glory Buff, RN  VTE Prevention/Management: ambulation promoted  Anti-Embolism Device Type: SCD, Knee  Anti-Embolism Intervention: Off  Anti-Embolism Device Location: BLE  Goal: Optimal Comfort and Wellbeing  Outcome: Ongoing - Unchanged  Goal: Readiness for Transition of Care  Outcome: Ongoing - Unchanged  Goal: Rounds/Family Conference  Outcome: Ongoing - Unchanged

## 2022-08-20 NOTE — Unmapped (Signed)
Physician Discharge Summary [facility]  3 [facility]  [address]  [place] [address]  Dept: [phone]  Loc: [phone]     Identifying Information:   Elaine Koch  May 28, 1985  [id number]    Primary Care Physician: Pcp, None Per Patient     Code Status: Full Code    Admit Date: 08/15/2022    Discharge Date: 08/20/2022     Discharge To: Home    Discharge Service: [facility] - Nephrology Floor Team (MED B - Tower)     Discharge Attending Physician: Neysa Hotter, MD    Discharge Diagnoses:   Principal Problem:    Worsening renal function (POA: Yes)  Active Problems:    Anemia in stage 3b chronic kidney disease (CMS-HCC) (POA: Yes)    Diabetic nephropathy (CMS-HCC) (POA: Yes)    Kidney replaced by transplant (POA: Not Applicable)    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 (POA: Not Applicable)    AKI (acute kidney injury) (CMS-HCC) (POA: Yes)    Anxiety and depression (POA: Yes)    Chest pain (POA: Unknown)    Headache (POA: Unknown)    History of migraine (POA: Not Applicable)  Resolved Problems:    * No resolved hospital problems. *    Hospital Course:   37 y.o. 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    C/f Renal Transplant Rejection - S/p kidney-pancreas transplant (05/03/19) - Worsening renal function   Follows with Dr. Carlene Coria (Transplant Nephrology). S/p kidney & pancreas transplant in 2021 for Type 1 diabetes. Found to have rise in Cr from baseline 1.5 to 2.4 on 08/01/22 without any symptoms concerning for rejection. Transplant kidney biopsy obtained by VIR on 7/5. 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 Evidence of hematuria on UA 7/7, likely secondary to renal biopsy. Patient started on IVIG overnight 7/6-7/7, but had significant full body pain reaction to IVIG, even after stopping and restarting with premedication. So, IVIG held (did not receive full dose). Solumedrol 500 mg IV x3 given 7/6-7/8. Rituximab given 7/7 and tolerated well. Transitioned to PO prednisone 10 mg (per recs from Dr. Carlene Coria). Continued on home immunosuppression (tacrolimus, azathioprine). Started on prophylactic Bactrim and Valcyte. Mild increase in Cr to 2.31 (from 2.19) at the time of discharge. Plan to follow up outpatient with Transplant Nephrology with labs in two days.     Headache - History of migraines   Patient developed a bilateral frontal headache overnight 7/7 with one associated episode of emesis (non-bloody), that did not respond to oxy 5 mg. Treated with triptan x2, compazine, and IV magnesium. Headache resolved on 7/9.     Macrocytic Anemia  Labs notable for MCV 107, with azathioprine as a potential contributing factor. B12 and folate are normal. High ferritin (305), low TIBC (221) c/w anemia of chronic inflammation. Hgb stable around baseline 8-9.     Chronic:   Hx Type 1 diabetes: Now cured, s/p pancreas transplant in 2022. BMP nl.   Depression: Continued home Lexapro 5 mg     The patient's hospital stay has been complicated by the following clinically significant conditions requiring additional evaluation and treatment or having a significant effect of this patient's care: - Anemia POA requiring further investigation or monitoring  - Chronic kidney disease POA requiring further  investigation, treatment, or monitoring     Outpatient Provider Follow Up Issues:   - Transplant Nephrology: follow up with labs BMP and tacrolimus levels, to be drawn on 7/11.     Touchbase with Outpatient Provider:  Warm Handoff: Not completed secondary to Dr. Carlene Coria (Transplant Nephrology) peripherally following and was updated by Dr. Eulah Pont on the day of discharge.      Procedures:  biopsy: renal biopsy with VIR ______________________________________________________________________  Discharge Medications:      Your Medication List        START taking these medications      predniSONE 10 MG tablet  Commonly known as: DELTASONE  Take 1 tablet (10 mg total) by mouth in the morning.     sulfamethoxazole-trimethoprim 400-80 mg per tablet  Commonly known as: BACTRIM  Take 1 tablet (80 mg of trimethoprim total) by mouth 3 (three) times a week.  Start taking on: August 21, 2022     valGANciclovir 450 mg tablet  Commonly known as: VALCYTE  Take 1 tablet (450 mg total) by mouth every other day.  Start taking on: August 21, 2022            CHANGE how you take these medications      ENVARSUS XR 4 mg Tb24 extended release tablet  Generic drug: tacrolimus  Take 4 tablets (16 mg total) by mouth daily. Take with 2x 1 mg tablet for a total of 18 mg  What changed: additional instructions     ENVARSUS XR 1 mg Tb24 extended release tablet  Generic drug: tacrolimus  Take 2 tablets (2 mg total) by mouth daily. Take with 4x 4 mg tablet for a total of 18 mg  Start taking on: August 21, 2022  What changed: You were already taking a medication with the same name, and this prescription was added. Make sure you understand how and when to take each.            CONTINUE taking these medications      azathioprine 50 mg tablet  Commonly known as: IMURAN  Take 2 tablets (100 mg total) by mouth daily     escitalopram oxalate 5 MG tablet  Commonly known as: LEXAPRO  Take 1 tablet (5 mg total) by mouth daily.     sodium bicarbonate 650 mg tablet  Take 2 tablets (1,300 mg total) by mouth Three (3) times a day.              Allergies:  Iodinated contrast media, Nickel, Privigen [immun glob g(igg)-pro-iga 0-50], Propranolol, Eye irrigating solution [ophthalmic irrigation solution], Iodine, Naltrexone, and Uni-cortrom  ______________________________________________________________________  Pending Test Results:  Pending Labs       Order Current Status    Quant TB AG1 value In process    Quant TB AG2 value In process    Quant TB Mitogen value In process    Quant TB Nil value In process    Quantiferon TB Gold Plus In process    Quantiferon TB Gold Plus In process    Tacrolimus Level, Trough In process    Surgical pathology exam Preliminary result          Most Recent Labs:  All lab results last 24 hours -   Recent Results (from the past 24 hour(s))   Basic Metabolic Panel    Collection Time: 08/20/22  9:36 AM   Result Value Ref Range    Sodium 143 135 - 145 mmol/L    Potassium 4.3 3.4 -  4.8 mmol/L    Chloride 110 (H) 98 - 107 mmol/L    CO2 30.0 20.0 - 31.0 mmol/L    Anion Gap 3 (L) 5 - 14 mmol/L    BUN 26 (H) 9 - 23 mg/dL    Creatinine 1.61 (H) 0.55 - 1.02 mg/dL    BUN/Creatinine Ratio 11     eGFR CKD-EPI (2021) Female 28 (L) >=60 mL/min/1.50m2    Glucose 94 70 - 179 mg/dL    Calcium 9.3 8.7 - 09.6 mg/dL   CBC    Collection Time: 08/20/22  9:36 AM   Result Value Ref Range    WBC 2.4 (L) 3.6 - 11.2 10*9/L    RBC 2.43 (L) 3.95 - 5.13 10*12/L    HGB 9.4 (L) 11.3 - 14.9 g/dL    HCT 04.5 (L) 40.9 - 44.0 %    MCV 110.2 (H) 77.6 - 95.7 fL    MCH 38.6 (H) 25.9 - 32.4 pg    MCHC 35.0 32.0 - 36.0 g/dL    RDW 81.1 (H) 91.4 - 15.2 %    MPV 8.8 6.8 - 10.7 fL    Platelet 172 150 - 450 10*9/L   Magnesium Level    Collection Time: 08/20/22  9:36 AM   Result Value Ref Range    Magnesium 1.9 1.6 - 2.6 mg/dL   Phosphorus Level    Collection Time: 08/20/22  9:36 AM   Result Value Ref Range    Phosphorus 3.9 2.4 - 5.1 mg/dL       Relevant Studies/Radiology:  ECG 12 Lead    Result Date: 08/18/2022  NORMAL SINUS RHYTHM POOR R WAVE PROGRESSION IN PRECORDIAL LEADS WHEN COMPARED WITH ECG OF 14-May-2022 17:51, NO SIGNIFICANT CHANGE WAS FOUND Confirmed by Schuyler Amor ([id number]) on 08/18/2022 10:27:41 AM    IR Biopsy Renal Percutaneous    Result Date: 08/17/2022  PROCEDURE: Ultrasound-guided biopsy ACCESSION: [id number] UN Procedural Personnel Attending physician(s): Benjamine Mola, MD Resident physician(s): None Advanced practice provider(s): None Procedure Date (mm/dd/yyyy): 08/17/2022 9:03 AM Pre-procedure diagnosis: Left lower quadrant renal transplant Post-procedure diagnosis: Same Indication: Concern for rejection Previous biopsy of same target: No Additional clinical history: None _______________________________________________________________ PROCEDURE SUMMARY: - Percutaneous US-guided coaxial core needle biopsy - Additional procedure(s): None PROCEDURE DETAILS: Pre-procedure Reference imaging for biopsy target: None Consent: Informed consent for the procedure including risks, benefits and alternatives was obtained and time-out was performed prior to the procedure. Preparation: The site was prepared and draped using maximal sterile barrier technique including cutaneous antisepsis. Anesthesia/sedation Level of anesthesia/sedation: Moderate sedation (conscious sedation) Anesthesia/sedation administered by: Independent trained observer under attending supervision with continuous monitoring of the patient's level of consciousness and physiologic status Total intra-service sedation time (minutes): I personally spent 7 minutes, continuously monitoring the patient face-to-face during the administration of moderate sedation. Radiology nurse was present for the duration of the procedure to assist in patient monitoring. Pre and Post Sedation activities have been reviewed. Imaging prior to biopsy The patient was positioned supine. Initial imaging was performed. Biopsy target: - Organ or target location: Kidney - Laterality: Left Biopsy Local anesthesia was administered. Under US guidance, the biopsy needle was advanced to the target and biopsy was performed. Coaxial needle: 17 gauge Core needle biopsy device: Corvacet Core needle size: 18 gauge Number of core specimens: 2 Fine needle aspiration device: Not applicable Fine needle size: Not applicable Number of FNA specimens: Not applicable On-site assessment of biopsy adequacy: No Additional sampling recommendations: None Preliminary assessment of sample adequacy: Not  applicable Needle removal The biopsy needle was removed and a sterile dressing was applied. Tract embolization: Gelfoam slurry Imaging following biopsy Immediate post-biopsy ultrasound was performed. Post-biopsy imaging findings: No evidence of hematoma Additional Details Additional description of procedure: None Registry event: / / Device used: None Equipment details: None Unique Device Identifiers: Not available Specimens removed: Biopsy samples as detailed above Estimated blood loss (mL): Less than 10 Standardized report: [email or web address]_v3.1 Attestation Signer name: Benjamine Mola I attest that I was present for the entire procedure. I reviewed the stored images and agree with the report as written. _______________________________________________________________ Complications: No immediate complications.     Ultrasound-guided biopsy of nontarget left lower quadrant renal transplant. Plan: Specimen(s) sent for evaluation.    ______________________________________________________________________  Discharge Instructions:     Follow up plan:   - Get labs drawn on 7/11 (electrolytes, kidney function, and tacrolimus levels).   - Your transplant coordinator will schedule a follow up appointment with Dr. Carlene Coria on 7/26 at 9:30 AM.   Medication changes:   - Start taking prednisone 10 mg daily   - Start taking Bactrim 400-80 mg three times a week   - Start taking Valcyte 450 mg every other day   - Increased Tacrolimus to 18 mg daily       Appointments which have been scheduled for you      Aug 22, 2022 11:00 AM  (Arrive by 10:30 AM)  RETURN VIDEO HCP MYCHART with Theda Sers, PhD  [facility] TRANSPLANT SURGERY [facility] REGION) [address]  [place] [address]  [phone]   Please sign into My [facility] Chart at least 15 minutes before your appointment to complete the eCheck-In process. You must complete eCheck-In before you can start your video visit. We also recommend testing your audio and video connection to troubleshoot any issues before your visit begins. Click ???Join Video Visit??? to complete these checks. Once you have completed eCheck-In and tested your audio and video, click ???Join Call??? to connect to your visit.     For your video visit, you will need a computer with a working camera, speaker and microphone, a smartphone, or a tablet with internet access.    My [place] Chart enables you to manage your health, send non-urgent messages to your provider, view your test results, schedule and manage appointments, and request prescription refills securely and conveniently from your computer or mobile device.    You can go to [email or web address] to sign in to your My [place] Chart account with your username and password. If you have forgotten your username or password, please choose the ???Forgot Username???? and/or ???Forgot Password???? links to gain access. You also can access your My [place] Chart account with the free MyChart mobile app for Android or iPhone.    If you need assistance accessing your My [place] Chart account or for assistance in reaching your provider's office to reschedule or cancel your appointment, please call [facility] [phone].         Aug 30, 2022 2:30 PM  (Arrive by 2:15 PM)  NURSE VISIT with [facility] NURSE  [facility] KIDNEY SPECIALTY AND TRANSPLANT CLINIC [place] [facility] REGION) [address]  [facility] [address]  [place] [address]  [phone]        Sep 13, 2022 9:20 AM  (Arrive by 8:20 AM)  IR BIOPSY RENAL PERCUTANEOUS with [facility] IR 3  IMG VASCULAR INTERVENTIONAL H&V [facility])  [address]  [place] [address]  [phone]   On appt date:  Come with adult to accompany pt home  Bring recent lab work  Bring any meds you take  Take meds w/small sip of water  Check w/physician about current meds  Check w/physician if diabetic  Check w/physician if pt takes blood thinners  Arrive 1 hr early    On appt date do not:  Consume solids after midnight  Consume anything 2 hrs    Let us know if pt:  Pregnant  Allergic to iodine or contrast dyes  Prior arterial or vascular graft operations  History of unstable angina  (Title:IRGEN)         Oct 25, 2022 12:00 PM  (Arrive by 11:45 AM)  RETURN NEPHROLOGY POST with Brayton Caves, MD  [facility] KIDNEY TRANSPLANT [place] [facility] REGION) [address]  [facility] 1 through [address]  [place] [address]  [phone]        Oct 30, 2022 3:40 PM  (Arrive by 3:25 PM)  RETURN  GENERAL with Carmon Ginsberg, MD   [facility]Y [place] [facility] REGION) [address] [place] [address]  [phone]             ______________________________________________________________________  Discharge Day Services:  BP 119/82  - Pulse 85  - Temp 36.8 ??C (98.2 ??F) (Oral)  - Resp 20  - Ht 165.1 cm (5' 5)  - Wt 68.1 kg (150 lb 0.4 oz)  - SpO2 98%  - BMI 24.97 kg/m??     Pt seen on the day of discharge and determined appropriate for discharge.    Condition at Discharge: fair    Length of Discharge: I spent greater than 30 mins in the discharge of this patient.     Tempie Hoist, MS4    I attest that I have reviewed the student note and that the components of the history of the present illness, the physical exam, and the assessment and plan documented were performed by me or were performed in my presence by the student where I verified the documentation and performed (or re-performed) the exam and medical decision making.    Dickie La, MD PGY-3   [facility] Internal Medicine

## 2022-08-21 ENCOUNTER — Other Ambulatory Visit (HOSPITAL_COMMUNITY): Payer: Medicare Other

## 2022-08-21 MED ORDER — VALGANCICLOVIR 450 MG TABLET
ORAL_TABLET | ORAL | 0 refills | 30.00000 days | Status: CP
Start: 2022-08-21 — End: 2022-09-20
  Filled 2022-08-20: qty 15, 30d supply, fill #0

## 2022-08-21 MED ORDER — TACROLIMUS XR 1 MG TABLET,EXTENDED RELEASE 24 HR
ORAL_TABLET | Freq: Every day | ORAL | 0 refills | 30 days | Status: CP
Start: 2022-08-21 — End: ?
  Filled 2022-10-08: qty 120, 30d supply, fill #0

## 2022-08-21 MED ORDER — SULFAMETHOXAZOLE 400 MG-TRIMETHOPRIM 80 MG TABLET
tablet | ORAL | 0 refills | 28 days | Status: CP
  Filled 2022-08-20: qty 12, 28d supply, fill #0

## 2022-08-22 ENCOUNTER — Other Ambulatory Visit (HOSPITAL_COMMUNITY): Payer: Medicare Other

## 2022-08-22 ENCOUNTER — Ambulatory Visit: Admit: 2022-08-22 | Payer: MEDICARE | Attending: Clinical | Primary: Clinical

## 2022-08-23 ENCOUNTER — Other Ambulatory Visit (HOSPITAL_COMMUNITY): Payer: Medicare Other

## 2022-08-23 NOTE — Unmapped (Signed)
Transplant Psychology No Show     Pt did not show to scheduled transplant psychology yesterday (08/22/22). Writer sent video link and called pt at appt time, no answer. Left VM requesting return call.    Writer called pt again today and she apologized for missing yesterday's appt. We agreed to reschedule to Monday 08/26/22 at 10am.      Theda Sers, Ph.D.  Clinical Psychologist

## 2022-08-24 LAB — RENAL FUNCTION PANEL
ALBUMIN: 3.9 g/dL (ref 3.9–4.9)
BLOOD UREA NITROGEN: 28 mg/dL — ABNORMAL HIGH (ref 6–20)
BUN / CREAT RATIO: 13 (ref 9–23)
CALCIUM: 9.3 mg/dL (ref 8.7–10.2)
CHLORIDE: 109 mmol/L — ABNORMAL HIGH (ref 96–106)
CO2: 24 mmol/L (ref 20–29)
CREATININE: 2.14 mg/dL — ABNORMAL HIGH (ref 0.57–1.00)
EGFR: 30 mL/min/{1.73_m2} — ABNORMAL LOW
GLUCOSE: 93 mg/dL (ref 70–99)
PHOSPHORUS, SERUM: 3.3 mg/dL (ref 3.0–4.3)
POTASSIUM: 4.3 mmol/L (ref 3.5–5.2)
SODIUM: 145 mmol/L — ABNORMAL HIGH (ref 134–144)

## 2022-08-24 LAB — MAGNESIUM: MAGNESIUM: 1.6 mg/dL (ref 1.6–2.3)

## 2022-08-26 ENCOUNTER — Other Ambulatory Visit (HOSPITAL_COMMUNITY): Payer: Medicare Other

## 2022-08-26 ENCOUNTER — Telehealth: Admit: 2022-08-26 | Discharge: 2022-08-27 | Payer: MEDICARE | Attending: Clinical | Primary: Clinical

## 2022-08-26 DIAGNOSIS — F32A Anxiety and depression: Principal | ICD-10-CM

## 2022-08-26 DIAGNOSIS — F419 Anxiety disorder, unspecified: Principal | ICD-10-CM

## 2022-08-26 LAB — CBC W/ DIFFERENTIAL
BASOPHILS ABSOLUTE COUNT: 0 10*3/uL (ref 0.0–0.2)
BASOPHILS RELATIVE PERCENT: 1 %
EOSINOPHILS ABSOLUTE COUNT: 0.1 10*3/uL (ref 0.0–0.4)
EOSINOPHILS RELATIVE PERCENT: 5 %
HEMATOCRIT: 23.6 % — ABNORMAL LOW (ref 34.0–46.6)
HEMOGLOBIN: 8.1 g/dL — ABNORMAL LOW (ref 11.1–15.9)
LYMPHOCYTES ABSOLUTE COUNT: 0.3 10*3/uL — ABNORMAL LOW (ref 0.7–3.1)
LYMPHOCYTES RELATIVE PERCENT: 14 %
MEAN CORPUSCULAR HEMOGLOBIN CONC: 34.3 g/dL (ref 31.5–35.7)
MEAN CORPUSCULAR HEMOGLOBIN: 36.8 pg — ABNORMAL HIGH (ref 26.6–33.0)
MEAN CORPUSCULAR VOLUME: 107 fL — ABNORMAL HIGH (ref 79–97)
MONOCYTES ABSOLUTE COUNT: 0.1 10*3/uL (ref 0.1–0.9)
MONOCYTES RELATIVE PERCENT: 6 %
NEUTROPHILS ABSOLUTE COUNT: 1.6 10*3/uL (ref 1.4–7.0)
NEUTROPHILS RELATIVE PERCENT: 74 %
PLATELET COUNT: 190 10*3/uL (ref 150–450)
RED BLOOD CELL COUNT: 2.2 x10E6/uL — CL (ref 3.77–5.28)
RED CELL DISTRIBUTION WIDTH: 15.8 % — ABNORMAL HIGH (ref 11.7–15.4)
WHITE BLOOD CELL COUNT: 2.2 10*3/uL — CL (ref 3.4–10.8)

## 2022-08-26 LAB — TACROLIMUS LEVEL: TACROLIMUS BLOOD: 8.3 ng/mL (ref 2.0–20.0)

## 2022-08-26 NOTE — Unmapped (Signed)
Confidential Psychological Therapy Session  [facility]     Patient Name: Elaine Koch  Medical Record Number: [id number]  Date of Service: August 26, 2022  Clinical Psychologist: Artemio Aly, PhD  Intern: None  Time Spent: 58 min of face-to-face counseling  CPT Procedure Code: 81191 (60 min psychotherapy with patient and/or family)  Therapy Type: Behavior Modifying/Cognitive Behavioral Therapy (CBT)  Purpose of Treatment: assess current MH needs, reduce depression symptoms, reduce anxiety symptoms, improve quality of life, improve coping, adjustment to chronic medical illness, safety planning     *This patient was not seen in person to minimize potential spread of COVID-19, protect patients/family/providers, and reduce PPE utilization. During this time, transplant psychology will be limiting person-to-person contact when possible.*          The patient reports they are physically located in [place] and is currently: at home. I conducted a audio/video visit. I spent  51m 52s on the video call with the patient. I spent an additional 30 minutes on pre- and post-visit activities on the date of service . Neither pt nor provider were located on-site.     The limits of confidentiality and the purpose of the evaluation were reviewed. The patient was provided with a verbal description of the nature and purpose of the psychological evaluation. I also reviewed the referral source, specific referral question for this evaluation, foreseeable risks/discomforts, benefits, limits of confidentiality, and mandatory reporting requirements of this provider. The patient was given the opportunity to ask questions and receive answers about the present evaluation. Oral consent was provided by the patient.      This evaluation note may contain sensitive and confidential information regarding the patient???s psychosocial adjustment to living with a chronic medical condition. DO NOT share this information outside [facility] without written consent from the patient explicitly stating that mental health records may be released.      Referral/Relevant History:  Ms.  Koch is a very pleasant 37 y.o.  female who presents for cognitive behavioral therapy to clarify current MH needs and address symptoms of anxiety that were reported during her most recent post-transplant nephrology follow-up with Dr. Lucienne Minks True on 05/30/22. At that time, Dr. Carlene Coria notes that pt's care has been somewhat disjointed due to many ED visits without outpatient nephrology follow-up, and pt's TNC Celene Squibb, RN Pharmacist, hospital that pt had been expressing anxiety about labs draws and managing post-transplant care in addition to other life stressors. 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      Elaine Koch met with writer initially on 07/12/22, when she endorsed symptoms consistent with PTSD, MDD Recurrent, and panic attacks, as well as frequent passive SI. She was currently engaged with a PHP through [facility] which had been beneficial, but as this was only a 21-day program, Clinical research associate agreed to continue to follow her for support until she is able to connect with a consistent, long-term provider. Elaine Koch was also recently admitted for treatment of rejection, and writer briefly saw her for inpatient support on 08/19/22. She was seen today for her 4th appt with Clinical research associate.     Review of Symptoms/ROS: Deferred     Subjective:   Elaine Koch reported that she has felt better physically since discharge from the hospital, including not having any headaches since being home. She has been very hungry in response to taking prednisone. Mentally, she described feeling okay; while she continues to experience symptoms of depression and anxiety, she noted that  she feels like her response to these emotions has been better, like trying to distract herself or take small steps forward instead of avoiding and isolating. Elaine Koch stated that she has been doing things like going outside, walking, and spending time with friends, which has been helpful. At the same time, her recent hospitalization has increased anxiety about her health, and this also resulted in her start date for her new job being pushed back two weeks, which is more time without income. She denied current SI/HI, admitting to some periods of passive SI in the past few weeks (like when in the hospital) but denying any intent/plan at those times. Today Elaine Koch also shared that she has historically struggled a lot with low self-esteem, which has been creeping up lately due to her new relationship and feeling like she does not deserve to be with him. She believes this stems from childhood, as she felt like her father did not spend as much time with her as her siblings and that her accomplishments (e.g. creativity) were not valued as much as others (e.g. more academic achievements).     Time was spent today reviewing previous coping strategies and reinforcing Elaine Koch's use of them in the previous weeks. We then spent time introducing cognitive restructuring, including the Catch it, Check it, Change it model. We used this to challenge several negative automatic thoughts that Elaine Koch reported, including thoughts of not deserving her new partner or worries about not being able to pay bills. She took notes while we discussed this and plans on using it during her journaling.     Elaine Koch noted that she has an appointment this week with [facility] to establish care for therapy, and [facility] has continued to prescribe her Lexapro even after her PHP ended. Additionally, when discussing her past, Elaine Koch talked about episodes where she made erratic decisions, including seeking relationships simply for attention or deciding spontaneously to move to [facility] after partying with friends there. She described episodes of days in a row with excessive energy and decreased need for sleep during these times, but stated that they have not occurred since her son was born 6 years ago. She was encouraged to discuss this at her next psychiatry appointment to potentially rule out and/or treat bipolar disorder.      Objective:   Mental Status Exam:  Appearance: Appears stated age and Clean/Neat, tearful at times  Motor: No abnormal movements  Speech/Language: Normal rate, volume, tone, fluency  Mood: Depressed and Anxious  Affect: Anxious, Depressed, and Mood congruent  Thought Process: Logical, linear, clear, coherent, goal directed  Thought Content: Suicidal Ideation, passive, denies plan/intent; denies HI or A/VH  Perceptual Disturbances: Denies auditory and visual hallucinations, behavior not concerning for response to internal stimuli  Orientation: Oriented to person, place, time, and general circumstances  Attention: Able to fully attend without fluctuations in consciousness  Concentration: Able to fully concentrate and attend  Memory: Immediate, short-term, long-term, and recall grossly intact  Fund of Knowledge: Consistent with level of education and development  Insight: Fair  Judgment: Fair  Impulse Control: Fair        Assessment:  Ms.  Howeth participated well in this CBT session and exhibits good motivation towards treatment goals. Today she reported ongoing symptoms of depression and anxiety, with a slight increase in anxiety this week in response to recent health issues (e.g. hospitalization for treating rejection, pushing back start date for new job). At the same time,  she also reports using coping skills learned in treatment very effectively, and feels like she is responding more appropriately to negative emotions than she has in the past. This was encouraged, and she was very engaged in treatment today during a discussion of negative automatic thoughts and how to challenge them. Her symptoms appear to be improving, and will attempt to collect more objective screening data (PHQ-9 and GAD-7) at subsequent visits to track progress. Additionally, while Ms. Fish denied any issues since her son was born 6 years ago, today she reported a history of episodes that sound c/w mania, so Bipolar Disorder should be ruled out.      Taken altogether, Ms. Pete appears to be experiencing moderate symptoms of depression and anxiety at this time, though with good engagement in our treatment, she seems to be utilizing more effective coping strategies lately and has reported some improvements in mood; anxiety appears to be best explained by recent trauma, as she initially reported symptoms c/w PTSD though this has not been the primary focus of our treatment to date. She would benefit from consistent, active engagement in [facility] care to treat these symptoms, and is scheduled for an intake at [facility] on 08/28/22. Writer will attempt to continue to meet with her as a bridge until she establishes local MH care (preferably both medication management and therapy)      Focus on current treatment is assessing current MH needs, safety planning, treatment planning.      Focus on future sessions will include ongoing assessment and safety planning, introducing and practicing CBT-oriented coping strategies.     Adherence concerns: None reported today, though previous nephrology notes suggest some issues with obtaining labs consistently and disjointed care (more ED visits than outpatient nephrology follow-up).     Diagnostic Impression: Posttraumatic Stress Disorder; Major Depressive Disorder, Severe, Recurrent; Unspecified Anxiety Disorder (panic attacks); R/O Bipolar Disorder        Risk Assessment:  A suicide and violence risk assessment was performed as part of this evaluation. The patient is deemed to be at chronic elevated risk for self-harm/suicide given the following factors: recent bereavement, recent trauma, current diagnosis of depression, hopelessness, previous acts of self-harm, suicidal ideation or threats without a plan, chronic severe medical condition, chronic mental illness > 5 years, and past diagnosis of depression. The patient is deemed to be at chronic elevated risk for violence given the following factors: recent loss. These risk factors are mitigated by the following factors:no know access to weapons or firearms, no history of violence, motivation for treatment, supportive family, sense of responsibility to family and social supports, minor children living at home, presence of an available support system, employment or functioning in a structured work/academic setting, religious or spiritual prohibition to suicide/violence, and safe housing. There is no acute risk for suicide or violence at this time. The patient was educated about relevant modifiable risk factors including following recommendations for treatment of psychiatric illness and abstaining from substance abuse.               While future psychiatric events cannot be accurately predicted, the patient does not currently require  acute inpatient psychiatric care and does not currently meet [facility] involuntary commitment criteria.               Psychometric Testing: None administered today.          Plan:  Ms.  Hummel has finished her PHP program at [facility], and has an intake scheduled  at [facility] on 08/28/22. Writer will attempt to meet with her as a bridge to ensure she connects with a provider. Additionally, she was encouraged to reach out to writer if she is unable to get [facility] to consistently prescribe her Lexapro. Specific skills she was encouraged to practice include diaphragmatic breathing, PMR, and using behavioral coping strategies for anxiety.      Ms.  Schlottman will return Wednesday 09/04/22 at 12:00pm via video.       Ms.  Collet was given this writer's contact information with confidential voice mail number and instructed to call 911 for emergencies.

## 2022-08-27 ENCOUNTER — Other Ambulatory Visit (HOSPITAL_COMMUNITY): Payer: Medicare Other

## 2022-08-27 NOTE — Unmapped (Signed)
[facility] Refill Coordination Note    Specialty Medication(s) to be Shipped:   Transplant: Azathioprine 50mg     Other medication(s) to be shipped: No additional medications requested for fill at this time     Elaine Koch, DOB: Jul 06, 1985  Phone: [phone] (home)       All above HIPAA information was verified with patient.     Was a Nurse, learning disability used for this call? No    Completed refill call assessment today to schedule patient's medication shipment from the [facility] [phone]).  All relevant notes have been reviewed.     Specialty medication(s) and dose(s) confirmed: Regimen is correct and unchanged.   Changes to medications: Elaine Koch reports no changes at this time.  Changes to insurance: No  New side effects reported not previously addressed with a pharmacist or physician: None reported  Questions for the pharmacist: No    Confirmed patient received a Conservation officer, historic buildings and a Surveyor, mining with first shipment. The patient will receive a drug information handout for each medication shipped and additional FDA Medication Guides as required.       DISEASE/MEDICATION-SPECIFIC INFORMATION        N/A    SPECIALTY MEDICATION ADHERENCE     Medication Adherence    Patient reported X missed doses in the last month: 0  Specialty Medication: azathioprine 50 mg tablet (IMURAN)  Patient is on additional specialty medications: No              Were doses missed due to medication being on hold? No         Azathioprine 50 mg: 3-4  days of medicine on hand     REFERRAL TO PHARMACIST     Referral to the pharmacist: Not needed      [facility]     Shipping address confirmed in Epic.     Patient was notified of new phone menu : Yes    Delivery Scheduled: Yes, Expected medication delivery date: 08/30/22 .     Medication will be delivered via UPS to the prescription address in Epic WAM.    Elaine Koch Shared [facility] Specialty Technician

## 2022-08-28 ENCOUNTER — Other Ambulatory Visit (HOSPITAL_COMMUNITY): Payer: Medicare Other

## 2022-08-28 NOTE — Progress Notes (Signed)
Virtual Visit via Video Note  I connected with Christine Reed on 07/26/22 at  9:00 AM EDT by a video enabled telemedicine application and verified that I am speaking with the correct person using two identifiers.  Location: Patient: patient home Provider: clinical home office   I discussed the limitations of evaluation and management by telemedicine and the availability of in person appointments. The patient expressed understanding and agreed to proceed.  I discussed the assessment and treatment plan with the patient. The patient was provided an opportunity to ask questions and all were answered. The patient agreed with the plan and demonstrated an understanding of the instructions.   The patient was advised to call back or seek an in-person evaluation if the symptoms worsen or if the condition fails to improve as anticipated.  Pt was provided 180 minutes of non-face-to-face time during this encounter.   Donia Guiles, LCSW   Daily Group Progress Note  Program: IOP  Group Time: 9:00 - 10:00  Participation Level: Active  Behavioral Response: Appropriate and Sharing  Type of Therapy:  Group Therapy  Summary of Progress: Clinician led check-in regarding current stressors and situation, and review of patient completed daily inventory. Clinician utilized active listening and empathetic response and validated patient emotions. Clinician facilitated processing group on pertinent issues.?  Patient arrived within time allowed. Patient rates her mood at a 6 on a scale of 1-10 with 10 being best. Pt reports recent success as managing irritation healthily. Pt reports recent concern with maintaining mood and energy over the weekend. Pt denies SI/HI.    Progress Towards Goals: Progressing     Group Time: 10:00 - 11:00    Participation Level:  Active   Behavioral Response: Appropriate and Sharing   Type of Therapy: Group Therapy   Summary of Progress: Cln introduced DBT distress  tolerance skill IMPROVE.  Cln discussed how this set of skills are for when you have to sit through an undesirable feeling and wait for it to pass. Group discussed how to apply the IMPROVE skills to decrease distress at the undesired feeling.  Pt engaged in discussion and reports understanding of topic.    Progress Towards Goals: Progressing     Group Time: 11:00 - 12:00   Participation Level:  Active   Behavioral Response: Appropriate and Sharing   Type of Therapy: Group Therapy   Summary of Progress: Chaplain, K Claussen, led group discussion on Strength. Group discussed ways they were taught to view strength and how that differs from the way they acknowledge/show strength now. Group worked to determine how to recognize the strength they hold.  Pt engaged in discussion and participated.    Progress Towards Goals: Progressing  Donia Guiles, LCSW

## 2022-08-29 ENCOUNTER — Other Ambulatory Visit (HOSPITAL_COMMUNITY): Payer: Self-pay | Admitting: Student in an Organized Health Care Education/Training Program

## 2022-08-29 ENCOUNTER — Other Ambulatory Visit (HOSPITAL_COMMUNITY): Payer: Medicare Other

## 2022-08-29 ENCOUNTER — Telehealth (HOSPITAL_COMMUNITY): Payer: Self-pay | Admitting: Psychiatry

## 2022-08-29 DIAGNOSIS — F321 Major depressive disorder, single episode, moderate: Secondary | ICD-10-CM

## 2022-08-29 MED ORDER — ESCITALOPRAM OXALATE 5 MG PO TABS
5.0000 mg | ORAL_TABLET | Freq: Every day | ORAL | 0 refills | Status: AC
Start: 2022-08-29 — End: ?

## 2022-08-29 MED FILL — AZATHIOPRINE 50 MG TABLET: ORAL | 30 days supply | Qty: 60 | Fill #7

## 2022-08-30 ENCOUNTER — Other Ambulatory Visit (HOSPITAL_COMMUNITY): Payer: Medicare Other

## 2022-08-30 ENCOUNTER — Institutional Professional Consult (permissible substitution): Admit: 2022-08-30 | Discharge: 2022-08-31 | Payer: MEDICARE

## 2022-08-30 DIAGNOSIS — N1832 Anemia in stage 3b chronic kidney disease (CMS-HCC): Principal | ICD-10-CM

## 2022-08-30 DIAGNOSIS — D631 Anemia in chronic kidney disease: Principal | ICD-10-CM

## 2022-08-30 MED ADMIN — darbepoetin alfa-polysorbate (ARANESP) injection 60 mcg: 60 ug | SUBCUTANEOUS | @ 19:00:00 | Stop: 2022-08-30 | NDC 55513000404

## 2022-08-30 NOTE — Unmapped (Signed)
Anemia clinic - First clinic visit today. HGB 8.1 on 7/12.  Aranesp administered in left arm.  Tolerated well. Return in 4 weeks.

## 2022-08-30 NOTE — Unmapped (Addendum)
KIDNEY POST-TRANSPLANT ASSESSMENT   Clinical Social Worker Telephone Note    Name:Elaine Koch  Date of Birth:1985/11/15  VWU:[id number]    REFERRAL INFORMATION:    Elaine Koch is s/p transplant for kidney transplantation . CSW follows up to assess financial questions/planning.    PREFERRED LANGUAGE: English    INTERPRETER UTILIZED: N/A    TRANSPLANT DATE:   05/03/2019 (Kidney / Pancreas)    POST TXP RN COORDINATOR:   Celene Squibb [phone]; [phone]    SUMMARY:  Spoke w/ pt re: financial assistance.  She has been out of work due to her health and has gotten behind on bills.  Medicaid eligible.  Pt agreed to send copy of bills to this CSW for review.      Lowella Petties, LCSW, CCTSW  Transplant Case Manager  [facility]  08/30/2022

## 2022-09-02 ENCOUNTER — Other Ambulatory Visit (HOSPITAL_COMMUNITY): Payer: Medicare Other

## 2022-09-02 DIAGNOSIS — Z940 Kidney transplant status: Principal | ICD-10-CM

## 2022-09-02 DIAGNOSIS — Z79899 Other long term (current) drug therapy: Principal | ICD-10-CM

## 2022-09-03 ENCOUNTER — Other Ambulatory Visit (HOSPITAL_COMMUNITY): Payer: Medicare Other

## 2022-09-03 NOTE — Unmapped (Unsigned)
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 Other labs are reassuring.    # Immunosuppression  Tacrolimus: Her tacrolimus level today will be a *** hour trough. Given her pancreas transplant and recent rejection, her goal is 8-10.  Lab Results   Component Value Date    TACROLIMUS 8.3 08/23/2022     Azathioprine 100 mg daily.  Prednisone 10 mg daily since rejection.    # GI symptoms/likely IBS  With improvement in her mental health, her GI symptoms have improved. For now, we will keep the GI appointment in September. If she continues to feel well, could cancel that appointment sometime in the future.    # Metabolic acidosis  Lab Results   Component Value Date    CO2 24 08/23/2022     Stable on sodium bicarbonate 1300 mg twice daily.    # Leukopenia  White blood cell count today is 2.1, with ANC 0.9. She has received Granix in the past. Given that she is getting Aranesp today, will hold off for now since her numbers are stable. Suspect this is secondary to bone marrow suppression from azathioprine, however I am hesitant to switch her back to mycophenolic acid since she is not actively preventing pregnancy.    # BP management    in clinic today. She has not required antihypertensives.    # Infectious disease  CMV PCR:   Lab Results   Component Value Date    CMVLR Not Detected 08/01/2022     BK PCR:   Lab Results   Component Value Date    BBKQR Not Detected 08/01/2022     EBV PCR: neg 05/30/22     # Anemia of CKD  Lab Results   Component Value Date    HGB 8.1 (L) 08/23/2022     Lab Results   Component Value Date    IRON 70 08/16/2022    TIBC 221 (L) 08/16/2022    LABIRON 32 08/16/2022     Getting scheduled Aranesp through the anemia clinic here at [facility].    # Cardiovascular  The ASCVD Risk score (Arnett DK, et al., 2019) failed to calculate..  Lab Results   Component Value Date    CHOL 120 08/01/2022    HDL 46 08/01/2022    LDL 59 08/01/2022     Lipid panel at goal, continue  to monitor .    # Bone and mineral metabolism  Lab Results   Component Value Date    CALCIUM 9.3 08/23/2022      Lab Results   Component Value Date    PHOS 3.9 08/20/2022     Lab Results   Component Value Date    PTH 112.5 (H) 10/11/2021     Stable, no changes.    # Electrolytes  Lab  Results   Component Value Date    MG 1.6 08/23/2022     Lab Results   Component Value Date    K 4.3 08/23/2022     Stable, no changes.    # Immunizations  Last Covid Booster: Completed - Date: 03/23/20  Flu Shot: Completed - Date: 12/21/21  Pneumococcal: Needed Prevnar-20  Shingrix: Needed age 70    # Cancer screening  PAP smear: completed - date: 02/06/21  Mammogram: N/A  Colonoscopy: N/A  Skin: recommend yearly dermatology evaluation  Renal US: completed - date: 04/24/22  CXR: completed - date: 02/01/22    # Childbearing age  She is not trying actively to prevent pregnancy. Did discuss that even though she is on an acceptable immunosuppression regimen, that it would be better to wait for pregnancy until her creatinine is stable.    Most pre-menopausal women experience return of fertility post-transplant, and a safe pregnancy is possible with careful planning. Recommend using abstinence or a highly effective contraception to avoid pregnancy until planning pregnancy, and if planning pregnancy please notify nephrology so that we can adjust medications to avoid teratogens.    There are no Patient Instructions on file for this visit.      No follow-ups on file.    ___________________________________________________________________      Kidney Transplant History:   Date of Transplant: 05/03/2019 (Kidney / Pancreas)  Type of Transplant: Deceased  KDPI: 34%  Native Kidney Disease: Type 1 Diabetes  Prior Transplants: None    Biopsies:   Zero-Hour Biopsy: Slim biopsy core without characteristic and diagnostic abnormalities.  03/02/2021: Minor morphologic changes.  08/16/2022: Limited cortex available for evaluation. Mild active antibody mediated rejection. Mild glomerulitis. Mild peritubular capillaritis. 1+ C4d peritubular capillary staining (<5%). Banff Score (2019): i0, t0, v0, g1, ptc1, C4d1, ci0, ct0, cv3, cg0, ptcml0, ti0, i-IFTA0, t-IFTA0, pvl0.    Donor Specific Antibodies:   To C17, first on 11/26/19 at 1226, peak 3268 on 10/11/21, 1551 on 05/30/22.  To DR51, first on 06/07/20 at 1330, 1103 on 05/30/22.    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 Had some low pressures as an outpatient, blood pressure meds held and was on midodrine for a time. Transitioned to Envarsus given high tacrolimus requirement.  - Admitted 6/1-07/16/19 with HA/N/V/D. Had AKI with tacrolimus level of 27.9. Had IVF, found to be C.diff positive and started on vancomycin.   - On 07/20/19 noted to have wbc < 0.6, Myfortic and Valcyte held, given Granix. Up to 3.9 on 07/26/19, Myfortic restarted at 180 mg BID (was 720 mg prior to holding).  - Diarrhea returned on 07/28/19, treated again with vanc for 14 days.  - Continued to have diarrhea, referred for FMT. Had been treated with fidaxomicin and bezlotoxumab.  - Admitted 8/20-8/22/21 for Enterococcal UTI.  - Had fecal transplant 02/14/20. Called 02/17/20 feeling poorly, admitted 1/6-1/11/22 with AKI and elevated tac level. Had proteus in urine and blood, was Covid positive as well. Treated with cefipime then then cipro, also get remdesivir. Myfortic and Valcyte held on discharge for leukopenia. Myfortic restarted 03/03/20.  - She unfortunately had recurrence of her diarrhea which did not respond to further oral therapy, had repeat FMT on 06/19/20.  - At her visit 09/01/20 she was  interested in pursuing pregnancy but knew that she could not be on Myfortic. Planned to have her see MFM for a consult prior to transitioning from Myfortic to azathioprine. She called 10/30/20 to report a positive pregnancy test. We stopped Myfortic and started azathioprine. She was seen in the ED 11/02/20 for vaginal bleeding, was seen by OB and felt to most likely be experiencing a miscarriage. Beta HCG was 14,771. On 11/08/20 was having ongoing bleeding and clots. She presented again to [facility] 11/17/20 with lower abdominal pain and bleeding. US showed no clear IUP and bHCG was down to 7672. Underwent D&C and subsequent diagnostic laparoscopy to rule out ectopic pregnancy, fallopian tubes were normal. Hgb dropped from 11 to 9. Creatinine remained at baseline. She was to get follow up bHCG following D&C but they were unable to reach her to get that done.  Micah Flesher to ED at [facility] 01/29/21 where she was found to have a bHCG of 190,000. US showed pregnancy at [redacted]w[redacted]d. She was able to tolerate po so was discharged home, creatinine was 1.53. She was seen by MFM 02/06/21. She did not have any labs between 01/29/21 and 02/23/21, creatinine from 1.53 to 1.73. Repeated labs on 02/28/21 and creatinine remained mildly elevated from baseline at 1.65. Since it was still early in pregnancy, we decided to biopsy her to rule out rejection before she was too progressed in her pregnancy. She underwent biopsy 03/02/21 that showed only minor morphological changes, no rejection.  -  She saw MFM and Dr. Thad Ranger 03/06/21 with plans to start epogen for low Hgb. Wanted to continue the pregnancy at that time, however called 03/12/21 asking to proceed with termination, due to concerns for affects of pregnancy on her kidney as well as having delivered previous pregnancy at 27 weeks. Underwent uterine vacuum aspiration on 03/20/21.   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 Was not trying to prevent pregnancy, was open to conceiving.  - Seen locally 11/22/21 for URI symptoms, Covid and strep negative. Not tested for RSV or flu that I can see. Recommended symptomatic treatment. Seen again 01/07/22 and 01/10/22 for ongoing cough, felt from GERD so protonix started.  - Noted to have wbc 1.9 on 01/09/22, planned for Granix at her appointment 01/11/22, however she cancelled that appointment stating that she was too sick to come to clinic. She ultimately had a dose on 01/24/22.  - Was seen in the ED 02/01/22 with 3 days of N/V. Given IVF and treated for possible UTI with cefdinir. Urine culture grew 50-100K CNS, GC and chlamydia negative, pregnancy negative.  - She did not connect for video visits 03/21/22 or 04/03/22.  - Presented to [facility] ED 04/06/22 with N/V. Treated for presumed pneumonia with amoxicillin, given Zofran. Symptoms improved but she experienced diarrhea and presented to ED 04/24/22 for concerns of C.diff. Renal US and CT abdomen unremarkable. She did not have a BM while in the ED for ~ 12 hours so discharged with a stool kit to collect a specimen. Seen at [facility] ED 05/03/22 for ongoing diarrhea, had not submitted stool sample. There a rapid C.diff was negative, given IVF and discharged on Bentyl and Imodium.   - Called on call coordinator 05/04/22 complaining of ongoing abdominal cramping and diarrhea, somewhat frustrated that no one could explain her symptoms. Placed e-consult for GI, when they reached out for a history, they were unable to reach her.  - Her coordinator reached out  05/06/22 and patient stated diarrhea was better and she was not needing Imodium. Was offered an appointment with me, patient declined and preferred to see GI locally. Called 05/14/22 stating she was having extreme stomach cramps. Again declined appointment with me, asked to be admitted. Was advised to come to ED.  - Admitted from 4/2-05/16/22. Noted that on 05/03/22 at [facility], her stool sample was positive for enteropathogenic E.coli. Was started on IV Cipro empirically, but repeat GIPP returned negative. She did not have further diarrhea while in house. GI referral made. Was also treated with Flagyl for BV. Noted to have new eosinophilia, ddx includes eosinophilic colitis from azathioprine.  - At her visit 05/30/22 she continued to have GI symptoms. She felt her symptoms were good for a while after her second fecal transplant, but around the end of February/early March she was put on antibiotics for pneumonia at [facility] and had GI symptoms ever since. Noted a lot of abdominal bloating. Would have 8-10 loose bowel movements at night about 1-2 times a week. Had pretty extreme abdominal cramping and burning, last 1-1.5 hours, did not change with eating or having a bowel movement. She reported that prior to kidney transplant, she had some irritable bowel symptoms, diarrhea, and sensitivity to certain foods.  - Was seen in the ED locally on 07/02/2022 to try to get established with outpatient mental health services. She reported over the previous 2 to 3 weeks an increase in her depression and anxiety. She was finding it difficult to go to work and was restless, fatigued with decreased appetite and decreased motivation. She denied suicidal ideation or plans. She was discharged with plans to follow-up with outpatient services. Our transplant psychologist contacted her 07/03/2022 and assisted her with some resources on finding mental health providers in her area as well as setting up an appointment with her on 07/12/2022. At that visit she reported that she had been engaging in group therapy through [facility] health and that was really helpful for her.  She had also started Prozac.     History of Present Illness:     Since the last visit 08/01/2022:  - At her visit 08/01/22 she noted that her mood was improved. Her abdominal symptoms had completely resolved. She did not feel Prozac was helping, that was switched to Lexapro. She has noted improvement in her mood with that but was causing a lot of daytime fatigue.  - At the visit 08/01/22 she was also noted to have creatinine up to 2.4. Underwent outpatient biopsy in [place] on 08/08/22, that was complicated by bleeding and no tissue was obtained. Attempted to scheduled outpatient VIR biopsy, but could not schedule in a timely manner. Therefore she was admitted 7/4 - 08/20/22. Underwent renal biopsy in VIR on 08/16/22 that showed mild active AMR. She received pulse Solumedrol and rituximab 1g on 08/18/22. Unfortunately she did not tolerate IVIG, had generalized pain with infusion, even when slowed/stopped and premeds given. Discharged home with Bactrim and Valcyte prophylaxis. Creatinine nadired at 1.91 on 08/18/22, was up to 2.31 on discharge. Back down to 2.14 on 08/23/22.  - Continues to follow with transplant psychology, last 08/26/22. Noted appointment to establish with a local provider 08/28/22.   - Had first dose Aranesp in clinic 08/30/22.    In clinic today:  ? TB test for work    Last dose of tacrolimus: 10 am    Concerns about nonadherence: In the past, seems to be on track now    Social: She  lives in [place], not working currently. Has a son born in 20.    Review of Systems:   All other systems negative, except as documented in the HPI.    Physical Exam:    There were no vitals taken for this visit.  General: Patient is a pleasant female in no apparent distress.  Eyes: Sclera anicteric.  ENT: Oropharynx without lesions.   Neck: Supple without LAD/JVD/bruits.  Lungs: Clear to auscultation bilaterally, no wheezes/rales/rhonchi.  Cardiovascular: Regular rate and rhythm without murmurs, rubs or gallops.  Abdomen: Soft, notender/nondistended. Positive bowel sounds. No tenderness over the graft.  Extremities: Without edema, joints without evidence of synovitis.  Skin: Without rash.  Neurological: Grossly nonfocal.  Psychiatric:  Somewhat flat affect .      Allergies:   Allergies   Allergen Reactions    Iodinated Contrast Media Swelling, Rash and Other (See Comments)     Burning, warmth throughout body and tingling.  Throat swelling.  Treated with benadryl and symptoms improved.  Has not had contrast since then (2013 or 2014).    Burning    Nickel Rash    Privigen [Immun Glob G(Igg)-Pro-Iga 0-50] Muscle Pain     PRIVIGEN brand: back and leg pain at usual rate of infusion during first dose, then full body pain again when rechallenged at slower rate of 30 mL/hr    Propranolol Other (See Comments) and Swelling    Eye Irrigating Solution [Ophthalmic Irrigation Solution] Other (See Comments)     Contrast dye (name?) used in eyes caused hot feeling in face, reversed with Benadryl.     Iodine      Other reaction(s): Skin Rash    Naltrexone Rash    Uni-Cortrom Rash        Current Medications:   Current Outpatient Medications   Medication Sig Dispense Refill    azathioprine (IMURAN) 50 mg tablet Take 2 tablets (100 mg total) by mouth daily 60 tablet 11    escitalopram oxalate (LEXAPRO) 5 MG tablet Take 1 tablet (5 mg total) by mouth daily.      predniSONE (DELTASONE) 10 MG tablet Take 1 tablet (10 mg total) by mouth in the morning. 30 tablet 0    sodium bicarbonate 650 mg tablet Take 2 tablets (1,300 mg total) by mouth Three (3) times a day. 180 tablet 3    sulfamethoxazole-trimethoprim (BACTRIM) 400-80 mg per tablet Take 1 tablet (80 mg of trimethoprim total) by mouth 3 (three) times a week. 12 tablet 0    tacrolimus (ENVARSUS XR) 1 mg Tb24 extended release tablet Take 2 tablets (2 mg total) by mouth daily. Take with four 4 mg tablets for a total of 18 mg 60 tablet 0    tacrolimus (ENVARSUS XR) 4 mg Tb24 extended release tablet Take 4 tablets (16 mg total) by mouth daily. Take with two 1 mg tablets for a total of 18 mg 120 tablet 11    valGANciclovir (VALCYTE) 450 mg tablet Take 1 tablet (450 mg total) by mouth every other day. 15 tablet 0     No current facility-administered medications for this visit.         Laboratory Studies:  No results found for this or any previous visit (from the past 170 hour(s)).          Electronically signed by:   Drinda Butts, MD  [facility]      I personally spent *** minutes face-to-face and non-face-to-face in the care of this patient, which  includes all pre, intra and post visit time on the date of service.

## 2022-09-04 ENCOUNTER — Other Ambulatory Visit (HOSPITAL_COMMUNITY): Payer: Medicare Other

## 2022-09-04 ENCOUNTER — Telehealth: Admit: 2022-09-04 | Discharge: 2022-09-05 | Payer: MEDICARE | Attending: Clinical | Primary: Clinical

## 2022-09-04 NOTE — Unmapped (Signed)
CONFIDENTIAL PSYCHOLOGICAL DOCUMENTATION    Elaine Koch was scheduled for a video appt today. She connected at appointment time but was noted to be driving. She apologized and stated that she is currently teaching at a band camp, and is on her lunch break; however the person who was supposed to be helping her was not there, so she had to rush to get lunch and get back for the children. Given that pt was driving and unable to complete full visit, we decided to reschedule until next Wednesday 09/11/22 at 12:00pm.    When Ms. Stillings reached her lunch and parked, Clinical research associate did quickly inquire about several things to ensure she did not need any additional assistance. Pt reported that she sent TSW Soyla Dryer, LCSW, CCTSW documents for financial assistance, and was appreciative of this. Unfortunately she was not able to attend her intake appt at [facility] for counseling due to an issue with her Medicaid showing as inactive, though she is sure that it is active. She plans on following up with the Medicaid office about this when she has time, but for now this is on the back burner as she prepares to start a new job next week. Ms. Holt did state that she reached out to her case manager at the [facility] program and they were able to refill her Lexapro, and schedule her an appointment with their psychiatry team for ongoing management of this prescription.     Ms. Chesney was encouraged to reach out with any other concerns or questions.     Theda Sers, PhD  Clinical Psychologist

## 2022-09-05 ENCOUNTER — Other Ambulatory Visit (HOSPITAL_COMMUNITY): Payer: Medicare Other

## 2022-09-06 ENCOUNTER — Other Ambulatory Visit (HOSPITAL_COMMUNITY): Payer: Medicare Other

## 2022-09-07 LAB — CBC W/ DIFFERENTIAL
BANDED NEUTROPHILS ABSOLUTE COUNT: 0 10*3/uL (ref 0.0–0.1)
BASOPHILS ABSOLUTE COUNT: 0 10*3/uL (ref 0.0–0.2)
BASOPHILS RELATIVE PERCENT: 1 %
EOSINOPHILS ABSOLUTE COUNT: 0.2 10*3/uL (ref 0.0–0.4)
EOSINOPHILS RELATIVE PERCENT: 5 %
HEMATOCRIT: 26.8 % — ABNORMAL LOW (ref 34.0–46.6)
HEMOGLOBIN: 9.1 g/dL — ABNORMAL LOW (ref 11.1–15.9)
IMMATURE GRANULOCYTES: 1 %
LYMPHOCYTES ABSOLUTE COUNT: 0.5 10*3/uL — ABNORMAL LOW (ref 0.7–3.1)
LYMPHOCYTES RELATIVE PERCENT: 12 %
MEAN CORPUSCULAR HEMOGLOBIN CONC: 34 g/dL (ref 31.5–35.7)
MEAN CORPUSCULAR HEMOGLOBIN: 38.6 pg — ABNORMAL HIGH (ref 26.6–33.0)
MEAN CORPUSCULAR VOLUME: 114 fL — ABNORMAL HIGH (ref 79–97)
MONOCYTES ABSOLUTE COUNT: 0.3 10*3/uL (ref 0.1–0.9)
MONOCYTES RELATIVE PERCENT: 8 %
NEUTROPHILS ABSOLUTE COUNT: 2.6 10*3/uL (ref 1.4–7.0)
NEUTROPHILS RELATIVE PERCENT: 73 %
PLATELET COUNT: 222 10*3/uL (ref 150–450)
RED BLOOD CELL COUNT: 2.36 x10E6/uL — CL (ref 3.77–5.28)
RED CELL DISTRIBUTION WIDTH: 16.5 % — ABNORMAL HIGH (ref 11.7–15.4)
WHITE BLOOD CELL COUNT: 3.6 10*3/uL (ref 3.4–10.8)

## 2022-09-07 LAB — RENAL FUNCTION PANEL
ALBUMIN: 4.1 g/dL (ref 3.9–4.9)
BLOOD UREA NITROGEN: 41 mg/dL — ABNORMAL HIGH (ref 6–20)
BUN / CREAT RATIO: 22 (ref 9–23)
CALCIUM: 9 mg/dL (ref 8.7–10.2)
CHLORIDE: 111 mmol/L — ABNORMAL HIGH (ref 96–106)
CO2: 18 mmol/L — ABNORMAL LOW (ref 20–29)
CREATININE: 1.87 mg/dL — ABNORMAL HIGH (ref 0.57–1.00)
EGFR: 36 mL/min/{1.73_m2} — ABNORMAL LOW
GLUCOSE: 92 mg/dL (ref 70–99)
PHOSPHORUS, SERUM: 4 mg/dL (ref 3.0–4.3)
POTASSIUM: 5 mmol/L (ref 3.5–5.2)
SODIUM: 141 mmol/L (ref 134–144)

## 2022-09-07 LAB — MAGNESIUM: MAGNESIUM: 1.8 mg/dL (ref 1.6–2.3)

## 2022-09-09 ENCOUNTER — Other Ambulatory Visit (HOSPITAL_COMMUNITY): Payer: Medicare Other

## 2022-09-09 DIAGNOSIS — Z79899 Other long term (current) drug therapy: Principal | ICD-10-CM

## 2022-09-09 DIAGNOSIS — Z940 Kidney transplant status: Principal | ICD-10-CM

## 2022-09-10 ENCOUNTER — Other Ambulatory Visit (HOSPITAL_COMMUNITY): Payer: Medicare Other

## 2022-09-10 LAB — TACROLIMUS LEVEL: TACROLIMUS BLOOD: 6.2 ng/mL (ref 2.0–20.0)

## 2022-09-11 ENCOUNTER — Other Ambulatory Visit (HOSPITAL_COMMUNITY): Payer: Medicare Other

## 2022-09-12 ENCOUNTER — Other Ambulatory Visit (HOSPITAL_COMMUNITY): Payer: Medicare Other

## 2022-09-12 DIAGNOSIS — D708 Other neutropenia: Principal | ICD-10-CM

## 2022-09-13 ENCOUNTER — Other Ambulatory Visit (HOSPITAL_COMMUNITY): Payer: Medicare Other

## 2022-09-16 DIAGNOSIS — Z940 Kidney transplant status: Principal | ICD-10-CM

## 2022-09-16 DIAGNOSIS — Z79899 Other long term (current) drug therapy: Principal | ICD-10-CM

## 2022-09-17 MED ORDER — PREDNISONE 10 MG TABLET
ORAL_TABLET | Freq: Every day | ORAL | 0 refills | 30 days | Status: CP
Start: 2022-09-17 — End: 2022-10-17

## 2022-09-19 DIAGNOSIS — Z940 Kidney transplant status: Principal | ICD-10-CM

## 2022-09-19 MED ORDER — TACROLIMUS XR 1 MG TABLET,EXTENDED RELEASE 24 HR
ORAL_TABLET | Freq: Every day | ORAL | 0 refills | 30 days
Start: 2022-09-19 — End: ?

## 2022-09-19 MED ORDER — TACROLIMUS XR 4 MG TABLET,EXTENDED RELEASE 24 HR
ORAL_TABLET | Freq: Every day | ORAL | 11 refills | 30 days
Start: 2022-09-19 — End: 2023-09-19

## 2022-09-19 MED ORDER — VALGANCICLOVIR 450 MG TABLET
ORAL_TABLET | ORAL | 0 refills | 30 days
Start: 2022-09-19 — End: 2022-10-19

## 2022-09-19 NOTE — Unmapped (Signed)
[facility] Refill Coordination Note    Specialty Medication(s) to be Shipped:   Transplant: Envarsus 1mg , Envarsus 4mg , and valgancyclovir 450mg     Pt is getting azathioprine filled at [facility] due to out of medication.    Other medication(s) to be shipped:  Bactrium     Elaine Koch, DOB: May 16, 1985  Phone: [phone] (home)       All above HIPAA information was verified with patient.     Was a Nurse, learning disability used for this call? No    Completed refill call assessment today to schedule patient's medication shipment from the [facility] [phone]).  All relevant notes have been reviewed.     Specialty medication(s) and dose(s) confirmed: Regimen is correct and unchanged.   Changes to medications: Varshini reports no changes at this time.  Changes to insurance: No  New side effects reported not previously addressed with a pharmacist or physician: None reported  Questions for the pharmacist: No    Confirmed patient received a Conservation officer, historic buildings and a Surveyor, mining with first shipment. The patient will receive a drug information handout for each medication shipped and additional FDA Medication Guides as required.       DISEASE/MEDICATION-SPECIFIC INFORMATION        N/A    SPECIALTY MEDICATION ADHERENCE     Medication Adherence    Patient reported X missed doses in the last month: 0  Specialty Medication: ENVARSUS XR 1 mg Tb24 extended release tablet (tacrolimus)  Patient is on additional specialty medications: Yes  Additional Specialty Medications: ENVARSUS XR 4 mg Tb24 extended release tablet (tacrolimus)  Patient Reported Additional Medication X Missed Doses in the Last Month: 0  Patient is on more than two specialty medications: Yes  Specialty Medication: valGANciclovir 450 mg tablet (VALCYTE)  Any gaps in refill history greater than 2 weeks in the last 3 months: no  Demonstrates understanding of importance of adherence: yes  Informant: patient  Confirmed plan for next specialty medication refill: delivery by pharmacy  Refills needed for supportive medications: yes, ordered or provider notified          Refill Coordination    Has the Patients' Contact Information Changed: No  Is the Shipping Address Different: No         Were doses missed due to medication being on hold? No    valGANciclovir 450  mg: 5 days of medicine on hand   ENVARSUS XR 4  mg: 5 days of medicine on hand   ENVARSUS XR 1  mg: 5 days of medicine on hand       REFERRAL TO PHARMACIST     Referral to the pharmacist: Not needed      [facility]     Shipping address confirmed in Epic.       Delivery Scheduled: Yes, Expected medication delivery date: 09/25/2022.     Medication will be delivered via UPS to the prescription address in Epic WAM.    Kerby Less   [facility] Specialty Technician

## 2022-09-19 NOTE — Unmapped (Signed)
Patient needs appointment with pcp for meds refills.

## 2022-09-20 MED ORDER — SULFAMETHOXAZOLE 400 MG-TRIMETHOPRIM 80 MG TABLET
ORAL_TABLET | ORAL | 0 refills | 28 days
Start: 2022-09-20 — End: 2022-10-20

## 2022-09-26 NOTE — Unmapped (Unsigned)
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 Unfortunately did not tolerate IVIG. 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 Other labs are reassuring.    # Immunosuppression  Tacrolimus: Her tacrolimus level today will be a *** hour trough. Given her pancreas transplant and recent rejection, her goal is 8-10.  Lab Results   Component Value Date    TACROLIMUS 6.2 09/06/2022     Azathioprine 100 mg daily.  Prednisone 10 mg daily since rejection.    # GI symptoms/likely IBS  With improvement in her mental health, her GI symptoms have improved. For now, we will keep the GI appointment in September. If she continues to feel well, could cancel that appointment sometime in the future.    # Metabolic acidosis  Lab Results   Component Value Date    CO2 18 (L) 09/06/2022     Stable on sodium bicarbonate 1300 mg twice daily.    # Leukopenia  Lab Results   Component Value Date    WBC 3.6 09/06/2022     Lab Results   Component Value Date    NEUTROABS 2.6 09/06/2022     Lab Results   Component Value Date    LYMPHSABS 0.5 (L) 09/06/2022     Suspect this is secondary to bone marrow suppression from azathioprine, however I am hesitant to switch her back to mycophenolic acid since she is not actively preventing pregnancy.    # BP management    in clinic today. She has not required antihypertensives.    # Infectious disease  CMV PCR:   Lab Results Component Value Date    CMVLR Not Detected 08/01/2022     BK PCR:   Lab Results   Component Value Date    BBKQR Not Detected 08/01/2022     EBV PCR: neg 05/30/22     # Anemia of CKD  Lab Results   Component Value Date    HGB 9.1 (L) 09/06/2022     Lab Results   Component Value Date    IRON 70 08/16/2022    TIBC 221 (L) 08/16/2022    LABIRON 32 08/16/2022     Getting scheduled Aranesp through the anemia clinic here at [facility].    # Cardiovascular  The ASCVD Risk score (Arnett DK, et al., 2019) failed to calculate..  Lab Results   Component Value Date    CHOL 120 08/01/2022    HDL 46 08/01/2022    LDL 59 08/01/2022     Lab Results   Component Value Date    TRIG 76 08/01/2022     Lipid panel at goal, continue  to monitor .    # Bone and mineral metabolism  Lab Results   Component Value  Date    CALCIUM 9.0 09/06/2022      Lab Results   Component Value Date    PHOS 3.9 08/20/2022     Lab Results   Component Value Date    PTH 112.5 (H) 10/11/2021     Stable, no changes.    # Electrolytes  Lab Results   Component Value Date    MG 1.8 09/06/2022     Lab Results   Component Value Date    K 5.0 09/06/2022     Stable, no changes.    # Immunizations  Last Covid Booster: Completed - Date: 03/23/20  Flu Shot: Completed - Date: 12/21/21  Pneumococcal: Needed Prevnar-20  Shingrix: Needed age 35    # Cancer screening  PAP smear: completed - date: 02/06/21  Mammogram: N/A  Colonoscopy: N/A  Skin: recommend yearly dermatology evaluation  Renal US: completed - date: 04/24/22  CXR: completed - date: 02/01/22    # Childbearing age  She is not trying actively to prevent pregnancy. Did discuss that even though she is on an acceptable immunosuppression regimen, that it would be better to wait for pregnancy until her creatinine is stable.    Most pre-menopausal women experience return of fertility post-transplant, and a safe pregnancy is possible with careful planning. Recommend using abstinence or a highly effective contraception to avoid pregnancy until planning pregnancy, and if planning pregnancy please notify nephrology so that we can adjust medications to avoid teratogens.    There are no Patient Instructions on file for this visit.      No follow-ups on file.    ___________________________________________________________________      Kidney Transplant History:   Date of Transplant: 05/03/2019 (Kidney / Pancreas)  Type of Transplant: Deceased  KDPI: 34%  Native Kidney Disease: Type 1 Diabetes  Prior Transplants: None    Biopsies:   Zero-Hour Biopsy: Slim biopsy core without characteristic and diagnostic abnormalities.  03/02/2021: Minor morphologic changes.  08/16/2022: Limited cortex available for evaluation. Mild active antibody mediated rejection. Mild glomerulitis. Mild peritubular capillaritis. 1+ C4d peritubular capillary staining (<5%). Banff Score (2019): i0, t0, v0, g1, ptc1, C4d1, ci0, ct0, cv3, cg0, ptcml0, ti0, i-IFTA0, t-IFTA0, pvl0.    Donor Specific Antibodies:   To C17, first on 11/26/19 at 1226, peak 3268 on 10/11/21, 1376 on 08/01/22.  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 Had some low pressures as an outpatient, blood pressure meds held and was on midodrine for a time. Transitioned to Envarsus given high tacrolimus requirement.  - Admitted 6/1-07/16/19 with HA/N/V/D. Had AKI with tacrolimus level of 27.9. Had IVF, found to be C.diff positive and started on vancomycin.   - On 07/20/19 noted to have wbc < 0.6, Myfortic and Valcyte held, given Granix. Up to 3.9 on 07/26/19, Myfortic restarted at 180 mg BID (was 720 mg prior to holding).  - Diarrhea returned on 07/28/19, treated again with vanc for 14 days.  - Continued to have diarrhea, referred for FMT. Had been treated with fidaxomicin and bezlotoxumab.  - Admitted 8/20-8/22/21 for Enterococcal UTI.  - Had fecal transplant 02/14/20. Called 02/17/20 feeling poorly, admitted 1/6-1/11/22 with AKI and elevated tac level. Had proteus in urine  and blood, was Covid positive as well. Treated with cefipime then then cipro, also get remdesivir. Myfortic and Valcyte held on discharge for leukopenia. Myfortic restarted 03/03/20.  - She unfortunately had recurrence of her diarrhea which did not respond to further oral therapy, had repeat FMT on 06/19/20.  - At her visit 09/01/20 she was interested in pursuing pregnancy but knew that she could not be on Myfortic. Planned to have her see MFM for a consult prior to transitioning from Myfortic to azathioprine. She called 10/30/20 to report a positive pregnancy test. We stopped Myfortic and started azathioprine. She was seen in the ED 11/02/20 for vaginal bleeding, was seen by OB and felt to most likely be experiencing a miscarriage. Beta HCG was 14,771. On 11/08/20 was having ongoing bleeding and clots. She presented again to [facility] 11/17/20 with lower abdominal pain and bleeding. US showed no clear IUP and bHCG was down to 7672. Underwent D&C and subsequent diagnostic laparoscopy to rule out ectopic pregnancy, fallopian tubes were normal. Hgb dropped from 11 to 9. Creatinine remained at baseline. She was to get follow up bHCG following D&C but they were unable to reach her to get that done.  Micah Flesher to ED at [facility] 01/29/21 where she was found to have a bHCG of 190,000. US showed pregnancy at [redacted]w[redacted]d. She was able to tolerate po so was discharged home, creatinine was 1.53. She was seen by MFM 02/06/21. She did not have any labs between 01/29/21 and 02/23/21, creatinine from 1.53 to 1.73. Repeated labs on 02/28/21 and creatinine remained mildly elevated from baseline at 1.65. Since it was still early in pregnancy, we decided to biopsy her to rule out rejection before she was too progressed in her pregnancy. She underwent biopsy 03/02/21 that showed only minor morphological changes, no rejection.  -  She saw MFM and Dr. Thad Ranger 03/06/21 with plans to start epogen for low Hgb. Wanted to continue the pregnancy at that time, however called 03/12/21 asking to proceed with termination, due to concerns for affects of pregnancy on her kidney as well as having delivered previous pregnancy at 27 weeks. Underwent uterine vacuum aspiration on 03/20/21.   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 Was not trying to prevent pregnancy, was open to conceiving.  - Seen locally 11/22/21 for URI symptoms, Covid and strep negative. Not tested for RSV or flu that I can see. Recommended symptomatic treatment. Seen again 01/07/22 and 01/10/22 for ongoing cough, felt from GERD so protonix started.  - Noted to have wbc 1.9 on 01/09/22, planned for Granix at her appointment 01/11/22, however she cancelled that appointment stating that she was too sick to come to clinic. She ultimately had a dose on 01/24/22.  - Was seen in the ED 02/01/22 with 3 days of N/V. Given IVF and treated for possible UTI with cefdinir. Urine culture grew 50-100K CNS, GC and chlamydia negative, pregnancy negative.  - She did not connect for video visits 03/21/22 or 04/03/22.  - Presented to [facility] ED 04/06/22 with N/V. Treated for presumed pneumonia with amoxicillin, given Zofran. Symptoms improved but she experienced diarrhea and presented to ED 04/24/22 for concerns of C.diff. Renal US and CT abdomen unremarkable. She did not have a BM while in the ED for ~ 12 hours so discharged with a stool kit to collect a specimen. Seen at [facility] ED 05/03/22 for ongoing diarrhea, had not submitted stool sample. There a rapid C.diff  was negative, given IVF and discharged on Bentyl and Imodium.   - Called on call coordinator 05/04/22 complaining of ongoing abdominal cramping and diarrhea, somewhat frustrated that no one could explain her symptoms. Placed e-consult for GI, when they reached out for a history, they were unable to reach her.  - Her coordinator reached out 05/06/22 and patient stated diarrhea was better and she was not needing Imodium. Was offered an appointment with me, patient declined and preferred to see GI locally. Called 05/14/22 stating she was having extreme stomach cramps. Again declined appointment with me, asked to be admitted. Was advised to come to ED.  - Admitted from 4/2-05/16/22. Noted that on 05/03/22 at [facility], her stool sample was positive for enteropathogenic E.coli. Was started on IV Cipro empirically, but repeat GIPP returned negative. She did not have further diarrhea while in house. GI referral made. Was also treated with Flagyl for BV. Noted to have new eosinophilia, ddx includes eosinophilic colitis from azathioprine.  - At her visit 05/30/22 she continued to have GI symptoms. She felt her symptoms were good for a while after her second fecal transplant, but around the end of February/early March she was put on antibiotics for pneumonia at [facility] and had GI symptoms ever since. Noted a lot of abdominal bloating. Would have 8-10 loose bowel movements at night about 1-2 times a week. Had pretty extreme abdominal cramping and burning, last 1-1.5 hours, did not change with eating or having a bowel movement. She reported that prior to kidney transplant, she had some irritable bowel symptoms, diarrhea, and sensitivity to certain foods.  - Was seen in the ED locally on 07/02/2022 to try to get established with outpatient mental health services. She reported over the previous 2 to 3 weeks an increase in her depression and anxiety. She was finding it difficult to go to work and was restless, fatigued with decreased appetite and decreased motivation. She denied suicidal ideation or plans. She was discharged with plans to follow-up with outpatient services. Our transplant psychologist contacted her 07/03/2022 and assisted her with some resources on finding mental health providers in her area as well as setting up an appointment with her on 07/12/2022. At that visit she reported that she had been engaging in group therapy through [facility] health and that was really helpful for her.  She had also started Prozac.     History of Present Illness:     Since the last visit 08/01/2022:  - At her visit 08/01/22 she noted that her mood was improved. Her abdominal symptoms had completely resolved. She did not feel Prozac was helping, that was switched to Lexapro. She has noted improvement in her mood with that but was causing a lot of daytime fatigue.  - At the visit 08/01/22 she was also noted to have creatinine up to 2.4. Underwent outpatient biopsy in [place] on 08/08/22, that was complicated by bleeding and no tissue was obtained. Attempted to scheduled outpatient VIR biopsy, but could not schedule in a timely manner. Therefore she was admitted 7/4 - 08/20/22. Underwent renal biopsy in VIR on 08/16/22 that showed mild active AMR. She received pulse Solumedrol and rituximab 1g on 08/18/22. Unfortunately she did not tolerate IVIG, had generalized pain with infusion, even when slowed/stopped and premeds given. Discharged home with Bactrim and Valcyte prophylaxis. Creatinine nadired at 1.91 on 08/18/22, was up to 2.31 on discharge. Back down to 2.14 on 08/23/22.  - Continues to follow with transplant psychology, last 08/26/22. Noted appointment to  establish with a local provider 08/28/22.   - Had first dose Aranesp in clinic 08/30/22.  - Had telemedicine visit with transplant psychology 09/04/22 but was at work so unable to do a full visit. Did say that she was unable to attend her intake appointment at her local behavioral health as her Medicaid was showing inactive, so she was going to have to reach out to them.  - She had an appointment with me 09/06/22, she cancelled the morning of the appointment.    In clinic today:  ? TB test for work    Last dose of tacrolimus: 10 am    Concerns about nonadherence: In the past, seems to be on track now    Social: She lives in [place], [place] Has a son born in 61.    Review of Systems:   All other systems negative, except as documented in the HPI.    Physical Exam:    There were no vitals taken for this visit.  General: Patient is a pleasant female in no apparent distress.  Eyes: Sclera anicteric.  ENT: Oropharynx without lesions.   Neck: Supple without LAD/JVD/bruits.  Lungs: Clear to auscultation bilaterally, no wheezes/rales/rhonchi.  Cardiovascular: Regular rate and rhythm without murmurs, rubs or gallops.  Abdomen: Soft, notender/nondistended. Positive bowel sounds. No tenderness over the graft.  Extremities: Without edema, joints without evidence of synovitis.  Skin: Without rash.  Neurological: Grossly nonfocal.  Psychiatric:  Somewhat flat affect .      Allergies:   Allergies   Allergen Reactions    Iodinated Contrast Media Swelling, Rash and Other (See Comments)     Burning, warmth throughout body and tingling.  Throat swelling.  Treated with benadryl and symptoms improved.  Has not had contrast since then (2013 or 2014).    Burning    Nickel Rash    Privigen [Immun Glob G(Igg)-Pro-Iga 0-50] Muscle Pain     PRIVIGEN brand: back and leg pain at usual rate of infusion during first dose, then full body pain again when rechallenged at slower rate of 30 mL/hr    Propranolol Other (See Comments) and Swelling    Eye Irrigating Solution [Ophthalmic Irrigation Solution] Other (See Comments)     Contrast dye (name?) used in eyes caused hot feeling in face, reversed with Benadryl.     Iodine      Other reaction(s): Skin Rash    Naltrexone Rash    Uni-Cortrom Rash        Current Medications:   Current Outpatient Medications   Medication Sig Dispense Refill    azathioprine (IMURAN) 50 mg tablet Take 2 tablets (100 mg total) by mouth daily 60 tablet 11    escitalopram oxalate (LEXAPRO) 5 MG tablet Take 1 tablet (5 mg total) by mouth daily.      predniSONE (DELTASONE) 10 MG tablet Take 1 tablet (10 mg total) by mouth in the morning. 30 tablet 0    sodium bicarbonate 650 mg tablet Take 2 tablets (1,300 mg total) by mouth Three (3) times a day. 180 tablet 3    tacrolimus (ENVARSUS XR) 1 mg Tb24 extended release tablet Take 2 tablets (2 mg total) by mouth daily. Take with four 4 mg tablets for a total of 18 mg 60 tablet 0    tacrolimus (ENVARSUS XR) 4 mg Tb24 extended release tablet Take 4 tablets (16 mg total) by mouth daily. Take with two 1 mg tablets for a total of 18 mg 120 tablet 11  No current facility-administered medications for this visit.         Laboratory Studies:  No results found for this or any previous visit (from the past 170 hour(s)).          Electronically signed by:   Drinda Butts, MD  [facility]      I personally spent *** minutes face-to-face and non-face-to-face in the care of this patient, which includes all pre, intra and post visit time on the date of service.

## 2022-10-07 DIAGNOSIS — Z940 Kidney transplant status: Principal | ICD-10-CM

## 2022-10-07 DIAGNOSIS — Z79899 Other long term (current) drug therapy: Principal | ICD-10-CM

## 2022-10-07 MED ORDER — ENVARSUS XR 1 MG TABLET,EXTENDED RELEASE
ORAL_TABLET | Freq: Every day | ORAL | 0 refills | 0.00000 days | Status: CP
Start: 2022-10-07 — End: ?
  Filled 2022-10-08: qty 60, 30d supply, fill #0

## 2022-10-07 NOTE — Unmapped (Signed)
[facility] Refill Coordination Note    Specialty Medication(s) to be Shipped:   Transplant: Envarsus 1mg  and Envarsus 4mg     Other medication(s) to be shipped:  azathioprine     Elaine Koch, DOB: [birth year]  Phone: [phone] (home)       All above HIPAA information was verified with patient.     Was a Nurse, learning disability used for this call? No    Completed refill call assessment today to schedule patient's medication shipment from the [facility] [phone]).  All relevant notes have been reviewed.     Specialty medication(s) and dose(s) confirmed: Regimen is correct and unchanged.   Changes to medications: [name] reports no changes at this time.  Changes to insurance: No  New side effects reported not previously addressed with a pharmacist or physician: None reported  Questions for the pharmacist: No    Confirmed patient received a Conservation officer, historic buildings and a Surveyor, mining with first shipment. The patient will receive a drug information handout for each medication shipped and additional FDA Medication Guides as required.       DISEASE/MEDICATION-SPECIFIC INFORMATION        N/A    SPECIALTY MEDICATION ADHERENCE     Medication Adherence    Patient reported X missed doses in the last month: 0  Specialty Medication: ENVARSUS XR 1 mg Tb24 extended release tablet (tacrolimus)  Patient is on additional specialty medications: Yes  Additional Specialty Medications: ENVARSUS XR 4 mg Tb24 extended release tablet (tacrolimus)  Patient Reported Additional Medication X Missed Doses in the Last Month: 0  Patient is on more than two specialty medications: Yes  Specialty Medication: azathioprine 50 mg tablet (IMURAN)  Patient Reported Additional Medication X Missed Doses in the Last Month: 0  Informant: patient  Reliability of informant: reliable  Provider-estimated medication adherence level: good  Patient is at risk for Non-Adherence: No  Reasons for non-adherence: no problems identified  Confirmed plan for next specialty medication refill: delivery by pharmacy  Refills needed for supportive medications: not needed          Refill Coordination    Has the Patients' Contact Information Changed: No  Is the Shipping Address Different: No         Were doses missed due to medication being on hold? No    envarsus 1 mg: 2 days of medicine on hand   envarsus 4 mg: 0 days of medicine on hand   Azathioprine 50mg  4days of medicine on hand     REFERRAL TO PHARMACIST     Referral to the pharmacist: Not needed      [facility]     Shipping address confirmed in Epic.       Delivery Scheduled: Yes, Expected medication delivery date: 08/28.  However, Rx request for refills was sent to the provider as there are none remaining.     Medication will be delivered via UPS to the prescription address in Epic WAM.    [name]   [facility] Specialty Technician

## 2022-10-08 MED FILL — AZATHIOPRINE 50 MG TABLET: ORAL | 30 days supply | Qty: 60 | Fill #8

## 2022-10-09 ENCOUNTER — Ambulatory Visit: Admit: 2022-10-09 | Payer: MEDICARE | Attending: Clinical | Primary: Clinical

## 2022-10-09 NOTE — Unmapped (Signed)
Transplant Psychology No Show     Pt did not show to scheduled transplant psychology appt today. Writer sent pt video link at appt time. Called pt several minutes later, no answer. Left VM requesting a return call.      Theda Sers, Ph.D.  Clinical Psychologist

## 2022-10-14 DIAGNOSIS — Z940 Kidney transplant status: Principal | ICD-10-CM

## 2022-10-14 DIAGNOSIS — Z79899 Other long term (current) drug therapy: Principal | ICD-10-CM

## 2022-10-16 DIAGNOSIS — Z940 Kidney transplant status: Principal | ICD-10-CM

## 2022-10-16 DIAGNOSIS — D708 Other neutropenia: Principal | ICD-10-CM

## 2022-10-23 ENCOUNTER — Telehealth: Admit: 2022-10-23 | Discharge: 2022-10-24 | Payer: MEDICARE

## 2022-10-23 DIAGNOSIS — K591 Functional diarrhea: Principal | ICD-10-CM

## 2022-10-23 NOTE — Unmapped (Addendum)
-   pls get me blood labs and stool testing  - please start taking L-glutamine supplement 5grams three times a day  - continue imodium as you are doig but no more than 2 tabs/ daily      Zhou et al.  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 Gut 2019    Thank you for seeking care with me today!   It was a pleasure to see you.  Please input my office phone number [phone] into your phone as a contact, so you will know if my office calls you.    Important numbers:  Clinic appointments:  - To make or reschedule a clinic appointment, pls call [phone] ext. [phone]    Contacting me:  - To leave me a message, call the [place] line [phone] ext [phone] and request to speak to my nurse.  For nonurgent messages, you can use Mychart to message me    Radiology:   [facility]  [place]:  [phone]  [facility]): [phone]  If you are being scheduled for any type of radiology study, you will need to call to receive your appointment time.  Please call this number for information.       Endoscopy scheduling:  - To call or reschedule an endoscopy appointment, that number is [phone]  - For urgent issues on nights and weekends, call [phone] and ask for the gastroenterologist on call.

## 2022-10-23 NOTE — Unmapped (Signed)
GI Faculty Video visit    A/P: 37 y.o. 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  Last test C Diff negative in April.  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  - pls get me blood labs and stool testing  - please start taking L-glutamine supplement 5grams three times a day  - continue imodium as you are doig but no more than 2 tabs/ daily      Zhou et al.  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 Gut 2019      The patient reports they are physically located in [place] and is currently: at home. I conducted a audio/video visit. I spent  68m 02s on the video call with the patient. I spent an additional 10 minutes on pre- and post-visit activities on the date of service .       HPI:  Has been having issues with her stomach.  Is very intermittent.  Some days, has true C Diff symptoms- watery diarrhea, abdominal pain and extreme bloating.  Then goes two days and it will completely stop and she'll have normal bowel movements.  Has gone twice to the hospital for the severe symptoms.  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  Episodes are once every couple of weeks.  Last week, used imodium twice a day but this week a better week but is having 3 loose stools /day.  Two loose stools today.      04/2022- stool positive ETEC  05/2022- negative for C Diff    Current Outpatient Medications on File Prior to Visit   Medication Sig Dispense Refill    azathioprine (IMURAN) 50 mg tablet Take 2 tablets (100 mg total) by mouth daily 60 tablet 11    tacrolimus (ENVARSUS XR) 1 mg Tb24 extended release tablet Take 2 tablets (2 mg total) by mouth daily with FOUR 4mg  tablets for total daily dose of 18mg  60 tablet 0    escitalopram oxalate (LEXAPRO) 5 MG tablet Take 1 tablet (5 mg total) by mouth daily.      [EXPIRED] predniSONE (DELTASONE) 10 MG tablet Take 1 tablet (10 mg total) by mouth in the morning. 30 tablet 0    sodium bicarbonate 650 mg tablet Take 2 tablets (1,300 mg total) by mouth Three (3) times a day. 180 tablet 3    tacrolimus (ENVARSUS XR) 4 mg Tb24 extended release tablet Take 4 tablets (16 mg total) by mouth daily. Take with two 1 mg tablets for a total of 18 mg 120 tablet 11    [DISCONTINUED] NIFEdipine (PROCARDIA XL) 30 MG 24 hr tablet Take 1 tablet (30 mg total) by mouth daily. 90 tablet 3     No current facility-administered medications on file prior to visit.

## 2022-10-25 ENCOUNTER — Ambulatory Visit: Admit: 2022-10-25 | Payer: MEDICARE

## 2022-10-25 ENCOUNTER — Ambulatory Visit: Admit: 2022-10-25 | Payer: MEDICARE | Attending: Nephrology | Primary: Nephrology

## 2022-10-25 DIAGNOSIS — Z940 Kidney transplant status: Principal | ICD-10-CM

## 2022-10-25 MED ORDER — SODIUM BICARBONATE 650 MG TABLET
ORAL_TABLET | Freq: Three times a day (TID) | ORAL | 3 refills | 30 days | Status: CP
Start: 2022-10-25 — End: 2023-10-25
  Filled 2022-11-07: qty 200, 34d supply, fill #0

## 2022-10-25 MED ORDER — TACROLIMUS XR 1 MG TABLET,EXTENDED RELEASE 24 HR
ORAL_TABLET | Freq: Every day | ORAL | 0 refills | 30 days | Status: CP
Start: 2022-10-25 — End: ?
  Filled 2022-11-01: qty 60, 30d supply, fill #0

## 2022-10-25 NOTE — Unmapped (Unsigned)
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 Unfortunately did not tolerate IVIG. 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 Other labs are reassuring.    # Immunosuppression  Tacrolimus: Her tacrolimus level today will be a *** hour trough. Given her pancreas transplant and recent rejection, her goal is 8-10.  Lab Results   Component Value Date    TACROLIMUS 6.2 09/06/2022     Azathioprine 100 mg daily.  Prednisone 10 mg daily since rejection.    # GI symptoms/likely IBS  With improvement in her mental health, her GI symptoms have improved. For now, we will keep the GI appointment in September. If she continues to feel well, could cancel that appointment sometime in the future.    # Metabolic acidosis  Lab Results   Component Value Date    CO2 18 (L) 09/06/2022     Stable on sodium bicarbonate 1300 mg twice daily.    # Leukopenia  Lab Results   Component Value Date    WBC 3.6 09/06/2022     Lab Results   Component Value Date    NEUTROABS 2.6 09/06/2022     Lab Results   Component Value Date    LYMPHSABS 0.5 (L) 09/06/2022     Suspect this is secondary to bone marrow suppression from azathioprine, however I am hesitant to switch her back to mycophenolic acid since she is not actively preventing pregnancy.    # BP management    in clinic today. She has not required antihypertensives.    # Infectious disease  CMV PCR:   Lab Results Component Value Date    CMVLR Not Detected 08/01/2022     BK PCR:   Lab Results   Component Value Date    BBKQR Not Detected 08/01/2022     EBV PCR: neg 05/30/22     # Anemia of CKD  Lab Results   Component Value Date    HGB 9.1 (L) 09/06/2022     Lab Results   Component Value Date    IRON 70 08/16/2022    TIBC 221 (L) 08/16/2022    LABIRON 32 08/16/2022     Getting scheduled Aranesp through the anemia clinic here at [facility].    # Cardiovascular  The ASCVD Risk score (Arnett DK, et al., 2019) failed to calculate..  Lab Results   Component Value Date    CHOL 120 08/01/2022    HDL 46 08/01/2022    LDL 59 08/01/2022     Lab Results   Component Value Date    TRIG 76 08/01/2022     Lipid panel at goal, continue  to monitor .    # Bone and mineral metabolism  Lab Results   Component Value  Date    CALCIUM 9.0 09/06/2022      Lab Results   Component Value Date    PHOS 3.9 08/20/2022     Lab Results   Component Value Date    PTH 112.5 (H) 10/11/2021     Stable, no changes.    # Electrolytes  Lab Results   Component Value Date    MG 1.8 09/06/2022     Lab Results   Component Value Date    K 5.0 09/06/2022     Stable, no changes.    # Immunizations  Last Covid Booster: Completed - Date: 03/23/20  Flu Shot: Completed - Date: 12/21/21  Pneumococcal: Needed Prevnar-20  Shingrix: Needed age 37    # Cancer screening  PAP smear: completed - date: 02/06/21  Mammogram: N/A  Colonoscopy: N/A  Skin: recommend yearly dermatology evaluation  Renal US: completed - date: 04/24/22  CXR: completed - date: 02/01/22    # Childbearing age  She is not trying actively to prevent pregnancy. Did discuss that even though she is on an acceptable immunosuppression regimen, that it would be better to wait for pregnancy until her creatinine is stable.    Most pre-menopausal women experience return of fertility post-transplant, and a safe pregnancy is possible with careful planning. Recommend using abstinence or a highly effective contraception to avoid pregnancy until planning pregnancy, and if planning pregnancy please notify nephrology so that we can adjust medications to avoid teratogens.    There are no Patient Instructions on file for this visit.      No follow-ups on file.    ___________________________________________________________________      Kidney Transplant History:   Date of Transplant: 05/03/2019 (Kidney / Pancreas)  Type of Transplant: Deceased  KDPI: 34%  Native Kidney Disease: Type 1 Diabetes  Prior Transplants: None    Biopsies:   Zero-Hour Biopsy: Slim biopsy core without characteristic and diagnostic abnormalities.  03/02/2021: Minor morphologic changes.  08/16/2022: Limited cortex available for evaluation. Mild active antibody mediated rejection. Mild glomerulitis. Mild peritubular capillaritis. 1+ C4d peritubular capillary staining (<5%). Banff Score (2019): i0, t0, v0, g1, ptc1, C4d1, ci0, ct0, cv3, cg0, ptcml0, ti0, i-IFTA0, t-IFTA0, pvl0.    Donor Specific Antibodies:   To C17, first on 11/26/19 at 1226, peak 3268 on 10/11/21, 1376 on 08/01/22.  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 Had some low pressures as an outpatient, blood pressure meds held and was on midodrine for a time. Transitioned to Envarsus given high tacrolimus requirement.  - Admitted 6/1-07/16/19 with HA/N/V/D. Had AKI with tacrolimus level of 27.9. Had IVF, found to be C.diff positive and started on vancomycin.   - On 07/20/19 noted to have wbc < 0.6, Myfortic and Valcyte held, given Granix. Up to 3.9 on 07/26/19, Myfortic restarted at 180 mg BID (was 720 mg prior to holding).  - Diarrhea returned on 07/28/19, treated again with vanc for 14 days.  - Continued to have diarrhea, referred for FMT. Had been treated with fidaxomicin and bezlotoxumab.  - Admitted 8/20-8/22/21 for Enterococcal UTI.  - Had fecal transplant 02/14/20. Called 02/17/20 feeling poorly, admitted 1/6-1/11/22 with AKI and elevated tac level. Had proteus in urine  and blood, was Covid positive as well. Treated with cefipime then then cipro, also get remdesivir. Myfortic and Valcyte held on discharge for leukopenia. Myfortic restarted 03/03/20.  - She unfortunately had recurrence of her diarrhea which did not respond to further oral therapy, had repeat FMT on 06/19/20.  - At her visit 09/01/20 she was interested in pursuing pregnancy but knew that she could not be on Myfortic. Planned to have her see MFM for a consult prior to transitioning from Myfortic to azathioprine. She called 10/30/20 to report a positive pregnancy test. We stopped Myfortic and started azathioprine. She was seen in the ED 11/02/20 for vaginal bleeding, was seen by OB and felt to most likely be experiencing a miscarriage. Beta HCG was 14,771. On 11/08/20 was having ongoing bleeding and clots. She presented again to [facility] 11/17/20 with lower abdominal pain and bleeding. US showed no clear IUP and bHCG was down to 7672. Underwent D&C and subsequent diagnostic laparoscopy to rule out ectopic pregnancy, fallopian tubes were normal. Hgb dropped from 11 to 9. Creatinine remained at baseline. She was to get follow up bHCG following D&C but they were unable to reach her to get that done.  Micah Flesher to ED at [facility] 01/29/21 where she was found to have a bHCG of 190,000. US showed pregnancy at [redacted]w[redacted]d. She was able to tolerate po so was discharged home, creatinine was 1.53. She was seen by MFM 02/06/21. She did not have any labs between 01/29/21 and 02/23/21, creatinine from 1.53 to 1.73. Repeated labs on 02/28/21 and creatinine remained mildly elevated from baseline at 1.65. Since it was still early in pregnancy, we decided to biopsy her to rule out rejection before she was too progressed in her pregnancy. She underwent biopsy 03/02/21 that showed only minor morphological changes, no rejection.  -  She saw MFM and Dr. Thad Ranger 03/06/21 with plans to start epogen for low Hgb. Wanted to continue the pregnancy at that time, however called 03/12/21 asking to proceed with termination, due to concerns for affects of pregnancy on her kidney as well as having delivered previous pregnancy at 27 weeks. Underwent uterine vacuum aspiration on 03/20/21.   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 Was not trying to prevent pregnancy, was open to conceiving.  - Seen locally 11/22/21 for URI symptoms, Covid and strep negative. Not tested for RSV or flu that I can see. Recommended symptomatic treatment. Seen again 01/07/22 and 01/10/22 for ongoing cough, felt from GERD so protonix started.  - Noted to have wbc 1.9 on 01/09/22, planned for Granix at her appointment 01/11/22, however she cancelled that appointment stating that she was too sick to come to clinic. She ultimately had a dose on 01/24/22.  - Was seen in the ED 02/01/22 with 3 days of N/V. Given IVF and treated for possible UTI with cefdinir. Urine culture grew 50-100K CNS, GC and chlamydia negative, pregnancy negative.  - She did not connect for video visits 03/21/22 or 04/03/22.  - Presented to [facility] ED 04/06/22 with N/V. Treated for presumed pneumonia with amoxicillin, given Zofran. Symptoms improved but she experienced diarrhea and presented to ED 04/24/22 for concerns of C.diff. Renal US and CT abdomen unremarkable. She did not have a BM while in the ED for ~ 12 hours so discharged with a stool kit to collect a specimen. Seen at [facility] ED 05/03/22 for ongoing diarrhea, had not submitted stool sample. There a rapid C.diff  was negative, given IVF and discharged on Bentyl and Imodium.   - Called on call coordinator 05/04/22 complaining of ongoing abdominal cramping and diarrhea, somewhat frustrated that no one could explain her symptoms. Placed e-consult for GI, when they reached out for a history, they were unable to reach her.  - Her coordinator reached out 05/06/22 and patient stated diarrhea was better and she was not needing Imodium. Was offered an appointment with me, patient declined and preferred to see GI locally. Called 05/14/22 stating she was having extreme stomach cramps. Again declined appointment with me, asked to be admitted. Was advised to come to ED.  - Admitted from 4/2-05/16/22. Noted that on 05/03/22 at [facility], her stool sample was positive for enteropathogenic E.coli. Was started on IV Cipro empirically, but repeat GIPP returned negative. She did not have further diarrhea while in house. GI referral made. Was also treated with Flagyl for BV. Noted to have new eosinophilia, ddx includes eosinophilic colitis from azathioprine.  - At her visit 05/30/22 she continued to have GI symptoms. She felt her symptoms were good for a while after her second fecal transplant, but around the end of February/early March she was put on antibiotics for pneumonia at [facility] and had GI symptoms ever since. Noted a lot of abdominal bloating. Would have 8-10 loose bowel movements at night about 1-2 times a week. Had pretty extreme abdominal cramping and burning, last 1-1.5 hours, did not change with eating or having a bowel movement. She reported that prior to kidney transplant, she had some irritable bowel symptoms, diarrhea, and sensitivity to certain foods.  - Was seen in the ED locally on 07/02/2022 to try to get established with outpatient mental health services. She reported over the previous 2 to 3 weeks an increase in her depression and anxiety. She was finding it difficult to go to work and was restless, fatigued with decreased appetite and decreased motivation. She denied suicidal ideation or plans. She was discharged with plans to follow-up with outpatient services. Our transplant psychologist contacted her 07/03/2022 and assisted her with some resources on finding mental health providers in her area as well as setting up an appointment with her on 07/12/2022. At that visit she reported that she had been engaging in group therapy through [facility] health and that was really helpful for her.  She had also started Prozac.     History of Present Illness:     Since the last visit 08/01/2022:  - At her visit 08/01/22 she noted that her mood was improved. Her abdominal symptoms had completely resolved. She did not feel Prozac was helping, that was switched to Lexapro. She has noted improvement in her mood with that but was causing a lot of daytime fatigue.  - At the visit 08/01/22 she was also noted to have creatinine up to 2.4. Underwent outpatient biopsy in [place] on 08/08/22, that was complicated by bleeding and no tissue was obtained. Attempted to scheduled outpatient VIR biopsy, but could not schedule in a timely manner. Therefore she was admitted 7/4 - 08/20/22. Underwent renal biopsy in VIR on 08/16/22 that showed mild active AMR. She received pulse Solumedrol and rituximab 1g on 08/18/22. Unfortunately she did not tolerate IVIG, had generalized pain with infusion, even when slowed/stopped and premeds given. Discharged home with Bactrim and Valcyte prophylaxis. Creatinine nadired at 1.91 on 08/18/22, was up to 2.31 on discharge. Back down to 2.14 on 08/23/22.  - Continues to follow with transplant psychology, last 08/26/22. Noted appointment to  establish with a local provider 08/28/22.   - Had first dose Aranesp in clinic 08/30/22.  - Had telemedicine visit with transplant psychology 09/04/22 but was at work so unable to do a full visit. Did say that she was unable to attend her intake appointment at her local behavioral health as her Medicaid was showing inactive, so she was going to have to reach out to them.  - She cancelled appointments with me 7/26 and 09/27/22. She was a no show to her psychology visit 10/09/22.  - She had a telemedicine visit with GI 10/23/22, felt loose stools most likely post-infectious IBS, but wanted to rule out recurrent C.diff and other less likely causes. Labs and stool studies ordered, recommended L-glutamine supplement 5g TID. Immodium OK but no more than 2 tabs per day.    In clinic today:  - She has not had labs since 09/06/22.    Last dose of tacrolimus: 10 am    Concerns about nonadherence: In the past, seems to be on track now    Social: She lives in [place], [place] Has a son born in 22.    Review of Systems:   All other systems negative, except as documented in the HPI.    Physical Exam:    There were no vitals taken for this visit.  General: Patient is a pleasant female in no apparent distress.  Eyes: Sclera anicteric.  ENT: Oropharynx without lesions.   Neck: Supple without LAD/JVD/bruits.  Lungs: Clear to auscultation bilaterally, no wheezes/rales/rhonchi.  Cardiovascular: Regular rate and rhythm without murmurs, rubs or gallops.  Abdomen: Soft, notender/nondistended. Positive bowel sounds. No tenderness over the graft.  Extremities: Without edema, joints without evidence of synovitis.  Skin: Without rash.  Neurological: Grossly nonfocal.  Psychiatric:  Somewhat flat affect .      Allergies:   Allergies   Allergen Reactions    Iodinated Contrast Media Swelling, Rash and Other (See Comments)     Burning, warmth throughout body and tingling.  Throat swelling.  Treated with benadryl and symptoms improved.  Has not had contrast since then (2013 or 2014).    Burning    Nickel Rash    Privigen [Immun Glob G(Igg)-Pro-Iga 0-50] Muscle Pain     PRIVIGEN brand: back and leg pain at usual rate of infusion during first dose, then full body pain again when rechallenged at slower rate of 30 mL/hr    Propranolol Other (See Comments) and Swelling    Eye Irrigating Solution [Ophthalmic Irrigation Solution] Other (See Comments)     Contrast dye (name?) used in eyes caused hot feeling in face, reversed with Benadryl.     Iodine      Other reaction(s): Skin Rash    Naltrexone Rash    Uni-Cortrom Rash        Current Medications:   Current Outpatient Medications   Medication Sig Dispense Refill    azathioprine (IMURAN) 50 mg tablet Take 2 tablets (100 mg total) by mouth daily 60 tablet 11    tacrolimus (ENVARSUS XR) 1 mg Tb24 extended release tablet Take 2 tablets (2 mg total) by mouth daily with FOUR 4mg  tablets for total daily dose of 18mg  60 tablet 0    escitalopram oxalate (LEXAPRO) 5 MG tablet Take 1 tablet (5 mg total) by mouth daily.      sodium bicarbonate 650 mg tablet Take 2 tablets (1,300 mg total) by mouth Three (3) times a day. 180 tablet 3    tacrolimus (ENVARSUS XR) 4  mg Tb24 extended release tablet Take 4 tablets (16 mg total) by mouth daily. Take with two 1 mg tablets for a total of 18 mg 120 tablet 11     No current facility-administered medications for this visit.         Laboratory Studies:  No results found for this or any previous visit (from the past 170 hour(s)).          Electronically signed by:   Drinda Butts, MD  [facility]      I personally spent *** minutes face-to-face and non-face-to-face in the care of this patient, which includes all pre, intra and post visit time on the date of service.

## 2022-10-25 NOTE — Unmapped (Signed)
[facility] Refill Coordination Note    Specialty Medication(s) to be Shipped:   Transplant: Envarsus 1mg , Envarsus 4mg , and azathioprine 50mg     Other medication(s) to be shipped:  sodium bicarbonate     Elaine Koch, DOB: [birth year]  Phone: [phone] (home)       All above HIPAA information was verified with patient.     Was a Nurse, learning disability used for this call? No    Completed refill call assessment today to schedule patient's medication shipment from the [facility]  [phone]).  All relevant notes have been reviewed.     Specialty medication(s) and dose(s) confirmed: Regimen is correct and unchanged.   Changes to medications: Shine reports no changes at this time.  Changes to insurance: No  New side effects reported not previously addressed with a pharmacist or physician: None reported  Questions for the pharmacist: No    Confirmed patient received a Conservation officer, historic buildings and a Surveyor, mining with first shipment. The patient will receive a drug information handout for each medication shipped and additional FDA Medication Guides as required.       DISEASE/MEDICATION-SPECIFIC INFORMATION        N/A    SPECIALTY MEDICATION ADHERENCE     Medication Adherence    Patient reported X missed doses in the last month: 0  Specialty Medication: azathioprine 50 mg tablet (IMURAN)  Patient is on additional specialty medications: Yes  Additional Specialty Medications: ENVARSUS XR 1 mg Tb24 extended release tablet (tacrolimus)  Patient Reported Additional Medication X Missed Doses in the Last Month: 0  Patient is on more than two specialty medications: Yes  Specialty Medication: ENVARSUS XR 4 mg Tb24 extended release tablet (tacrolimus)  Patient Reported Additional Medication X Missed Doses in the Last Month: 0  Any gaps in refill history greater than 2 weeks in the last 3 months: no  Demonstrates understanding of importance of adherence: yes  Informant: patient  Confirmed plan for next specialty medication refill: delivery by pharmacy  Refills needed for supportive medications: not needed          Refill Coordination    Has the Patients' Contact Information Changed: No  Is the Shipping Address Different: No         Were doses missed due to medication being on hold? No    ENVARSUS XR 4  mg: 7 days of medicine on hand   ENVARSUS XR 1  mg: 7 days of medicine on hand   azathioprine 50  mg: 7 days of medicine on hand       REFERRAL TO PHARMACIST     Referral to the pharmacist: Not needed      SHIPPING     Shipping address confirmed in Epic.       Delivery Scheduled: Yes, Expected medication delivery date: 11/04/2022.     Medication will be delivered via UPS to the prescription address in Epic WAM.    Kerby Less   [facility]  Specialty Technician

## 2022-10-25 NOTE — Unmapped (Unsigned)
No show

## 2022-10-25 NOTE — Unmapped (Signed)
Pt request for RX Refill

## 2022-11-01 MED FILL — AZATHIOPRINE 50 MG TABLET: ORAL | 30 days supply | Qty: 60 | Fill #9

## 2022-11-01 MED FILL — ENVARSUS XR 4 MG TABLET,EXTENDED RELEASE: ORAL | 30 days supply | Qty: 120 | Fill #1

## 2022-11-03 ENCOUNTER — Emergency Department: Admit: 2022-11-03 | Discharge: 2022-11-03 | Disposition: A | Discharge status: Home / Self Care | Payer: MEDICARE

## 2022-11-03 ENCOUNTER — Ambulatory Visit: Admit: 2022-11-03 | Discharge: 2022-11-03 | Disposition: A | Discharge status: Home / Self Care | Payer: MEDICARE

## 2022-11-03 DIAGNOSIS — O4690 Antepartum hemorrhage, unspecified, unspecified trimester: Principal | ICD-10-CM

## 2022-11-03 LAB — URINALYSIS WITH MICROSCOPY WITH CULTURE REFLEX PERFORMABLE
BACTERIA: NONE SEEN /HPF
BILIRUBIN UA: NEGATIVE
BLOOD UA: NEGATIVE
GLUCOSE UA: NEGATIVE
KETONES UA: NEGATIVE
LEUKOCYTE ESTERASE UA: NEGATIVE
NITRITE UA: NEGATIVE
PH UA: 5.5 (ref 5.0–9.0)
RBC UA: <1 /HPF (ref <=4)
SPECIFIC GRAVITY UA: 1.014 (ref 1.003–1.030)
SQUAMOUS EPITHELIAL: <1 /HPF (ref 0–5)
UROBILINOGEN UA: <2
WBC UA: 1 /HPF (ref 0–5)

## 2022-11-03 LAB — COMPREHENSIVE METABOLIC PANEL
ALBUMIN: 4.2 g/dL (ref 3.4–5.0)
ALKALINE PHOSPHATASE: 44 U/L — ABNORMAL LOW (ref 46–116)
ALT (SGPT): 66 U/L — ABNORMAL HIGH (ref 10–49)
ANION GAP: 6 mmol/L (ref 5–14)
AST (SGOT): 45 U/L — ABNORMAL HIGH (ref <=34)
BILIRUBIN TOTAL: 0.8 mg/dL (ref 0.3–1.2)
BLOOD UREA NITROGEN: 25 mg/dL — ABNORMAL HIGH (ref 9–23)
BUN / CREAT RATIO: 14
CALCIUM: 9.5 mg/dL (ref 8.7–10.4)
CHLORIDE: 117 mmol/L — ABNORMAL HIGH (ref 98–107)
CO2: 20 mmol/L (ref 20.0–31.0)
CREATININE: 1.84 mg/dL — ABNORMAL HIGH
EGFR CKD-EPI (2021) FEMALE: 36 mL/min/{1.73_m2} — ABNORMAL LOW (ref >=60)
GLUCOSE RANDOM: 91 mg/dL (ref 70–179)
POTASSIUM: 5 mmol/L — ABNORMAL HIGH (ref 3.4–4.8)
PROTEIN TOTAL: 6.8 g/dL (ref 5.7–8.2)
SODIUM: 143 mmol/L (ref 135–145)

## 2022-11-03 LAB — CBC W/ AUTO DIFF
BASOPHILS ABSOLUTE COUNT: 0 10*9/L (ref 0.0–0.1)
BASOPHILS RELATIVE PERCENT: 0.8 %
EOSINOPHILS ABSOLUTE COUNT: 0.1 10*9/L (ref 0.0–0.5)
EOSINOPHILS RELATIVE PERCENT: 7.9 %
HEMATOCRIT: 26.2 % — ABNORMAL LOW (ref 34.0–44.0)
HEMOGLOBIN: 8.8 g/dL — ABNORMAL LOW (ref 11.3–14.9)
LYMPHOCYTES ABSOLUTE COUNT: 0.3 10*9/L — ABNORMAL LOW (ref 1.1–3.6)
LYMPHOCYTES RELATIVE PERCENT: 20.2 %
MEAN CORPUSCULAR HEMOGLOBIN CONC: 33.7 g/dL (ref 32.0–36.0)
MEAN CORPUSCULAR HEMOGLOBIN: 37 pg — ABNORMAL HIGH (ref 25.9–32.4)
MEAN CORPUSCULAR VOLUME: 109.7 fL — ABNORMAL HIGH (ref 77.6–95.7)
MEAN PLATELET VOLUME: 8.3 fL (ref 6.8–10.7)
MONOCYTES ABSOLUTE COUNT: 0.1 10*9/L — ABNORMAL LOW (ref 0.3–0.8)
MONOCYTES RELATIVE PERCENT: 9.6 %
NEUTROPHILS ABSOLUTE COUNT: 0.9 10*9/L — ABNORMAL LOW (ref 1.8–7.8)
NEUTROPHILS RELATIVE PERCENT: 61.5 %
PLATELET COUNT: 225 10*9/L (ref 150–450)
RED BLOOD CELL COUNT: 2.39 10*12/L — ABNORMAL LOW (ref 3.95–5.13)
RED CELL DISTRIBUTION WIDTH: 14.9 % (ref 12.2–15.2)
WBC ADJUSTED: 1.5 10*9/L — ABNORMAL LOW (ref 3.6–11.2)

## 2022-11-03 LAB — HCG QUANTITATIVE, BLOOD: GONADOTROPIN, CHORIONIC (HCG) QUANT: 134804 m[IU]/mL

## 2022-11-03 LAB — PREGNANCY, URINE: PREGNANCY TEST URINE: POSITIVE — AB

## 2022-11-03 MED ADMIN — acetaminophen (OFIRMEV) 10 mg/mL injection 1,000 mg: 1000 mg | INTRAVENOUS | @ 20:00:00 | Stop: 2022-11-03

## 2022-11-03 MED ADMIN — lactated ringers bolus 500 mL: 500 mL | INTRAVENOUS | @ 20:00:00 | Stop: 2022-11-03

## 2022-11-03 NOTE — Unmapped (Signed)
[facility]  Emergency Department Provider Note     ED Clinical Impression     Final diagnoses:   Vaginal bleeding during pregnancy (Primary)      Impression, Medical Decision Making, ED Course     Impression: 37 y.o. 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   [id number] who presents with complaints of abdominal pain, headache, vaginal spotting for the past couple of weeks as described below.  On exam, patient is overall well-appearing, no acute distress, vital signs stable, afebrile.  Physical exam as documented below.    DDx/MDM: Differential for female with lower abdominal pain and/or flank pain includes appendicitis, diverticulitis, PID/TOA, hemorrhagic ovarian cyst, ovarian torsion, other GU pathology, cervicitis, renal colic, nephrolithiasis, pyelonephritis, cholecystitis (or other biliary pathology), bowel obstruction, ectopic pregnancy. Based on the history and physical below I believe the symptoms are most consistent with early pregnancy vs ectopic pregnancy vs miscarriage. Patient denies concern for STI at this time and does not desire testing. She is currently declining pelvic exam given no current bleeding.     Diagnostic workup as below. Will treat patient with 1 g IV Tylenol, 500 mL LR.   Patient reporting resolution of headache with tylenol and fluids. Awaiting results of [place]. Rh +, No rhogam indicated.   1850 US demonstrating Intrauterine pregnancy with findings concerning for early pregnancy loss.   1900 Discussed case with OBGYN who reports patient should follow up for a repeat ultrasound in 10 days and no other current recommendations.   Discussed findings with patient who verbalized understanding. Educated on strict bleeding/pain return precautions, symptomatic management, and patient was discharged in stable condition. Referral placed for OB MFM follow up.     Orders Placed This Encounter   Procedures    CBC w/ Differential    Comprehensive Metabolic Panel    Urinalysis with Microscopy with Culture Reflex    Urine, Pregnancy Qualitatve    hCG QUANTitative, Blood    Ambulatory referral to Obstetrics / Gynecology    Type and Screen    Insert peripheral IV    US OB Transvaginal    US OB Less Than 14 Wks Transabd 1st Tri Single Gest          MDM Elements                ____________________________________________    The case was discussed with the attending physician, who is in agreement with the above assessment and plan.      History     Chief Complaint  Chief Complaint   Patient presents with    Medical Problem       HPI   Jaanai Marylinda Bortnick is a 37 y.o. female with past medical history as below who presents with intermittent diarrhea for approximately 2 months.  Had a consult with GI last week but has not obtained lab work yet.  Additionally, she has reported that she has had vaginal spotting over the past 2 weeks.  Denies enough bleeding to saturate a pad.  She is concerned that she is pregnant as she took a pregnancy test, which was positive. This would be a desired pregnancy, though she notes she had a miscarriage last year. Reports that she has abnormal periods at baseline, and believes her last menstrual period was in July.  Also  noting concerns of intractable headache for the past 2 weeks.  Has been using Tylenol daily with minimal relief.  No vision or hearing changes, dizziness, head injury. No fever, chills, nausea, vomiting. History of kidney and pancreas transplant, currently taking antirejection medications.     Patient did not use any medications prior to coming to the emergency department today.     Outside Historian(s): None.    Past Medical History:   Diagnosis Date    Chronic hypertension during pregnancy, antepartum 07/22/2015 Overview:  Methyldopa recommended per nephrologist if needed    Diabetes mellitus type 1 (CMS-HCC)     ESRD (end stage renal disease) (CMS-HCC)     History of pre-eclampsia 10/24/2016    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       Past Surgical History:   Procedure Laterality Date    AV FISTULA PLACEMENT  2018    CESAREAN SECTION      PR COLONOSCOPY FLX DX W/COLLJ SPEC WHEN PFRMD N/A 02/14/2020    Procedure: COLONOSCOPY, FLEXIBLE, PROXIMAL TO SPLENIC FLEXURE; DIAGNOSTIC, W/WO COLLECTION SPECIMEN BY BRUSH OR WASH;  Surgeon: Carmon Ginsberg, MD;  Location: [facility];  Service: Gastroenterology    PR COLONOSCOPY FLX DX W/COLLJ SPEC WHEN PFRMD N/A 06/19/2020    Procedure: COLONOSCOPY, FLEXIBLE, PROXIMAL TO SPLENIC FLEXURE; DIAGNOSTIC, W/WO COLLECTION SPECIMEN BY BRUSH OR WASH;  Surgeon: Carmon Ginsberg, MD;  Location: GI [facility];  Service: Gastroenterology    PR FECAL MICROBIOTA PREP INSTIL N/A 02/14/2020    Procedure: PREP W INSTILLATION FECAL MICROBIOTA, ANY METHOD;  Surgeon: Carmon Ginsberg, MD;  Location: GI [facility];  Service: Gastroenterology    PR INDUCED ABORTN BY DIL/EVAC N/A 03/20/2021    Procedure: DILATION AND EVACUATION - INDUCED;  Surgeon: Gaynelle Cage, MD;  [facility]: [facility] OR [facility];  Service: Family Planning    PR LAP,DIAGNOSTIC ABDOMEN N/A 11/17/2020    Procedure: DIAGNOSTIC AND OR OPERATIVE LAPAROSCOPY;  Surgeon: Estil Daft, MD;  Location: MAIN OR [facility];  Service: Womens Primary Gynecology    PR PREPARE FECAL MICROBIOTA FOR INSTILLATION  06/19/2020    Procedure: PREP FECAL MICROBIOTA FOR INSTILLATION, INCLUDING ASSESSMENT OF DONOR SPECIMEN;  Surgeon: Carmon Ginsberg, MD;  Location: GI [facility];  Service: Gastroenterology    PR SURG RX MISSED ABORTN,1ST TRI N/A 11/17/2020    Procedure: VACUUM ASPIRATION;  Surgeon: Estil Daft, MD;  Location: MAIN OR [facility]; Service: [facility] Primary Gynecology    PR TRANSPLANT ALLOGRAFT PANCREAS N/A 05/03/2019    Procedure: TRANSPLANTATION OF PANCREATIC ALLOGRAFT;  Surgeon: Leona Carry, MD;  Location: MAIN OR [facility];  Service: Transplant    PR TRANSPLANT,PREP CADAVER RENAL GRAFT N/A 05/03/2019    Procedure: [facility] STD PREP CAD DONR RENAL ALLOGFT PRIOR TO TRNSPLNT, INCL DISSEC/REM PERINEPH FAT, DIAPH/RTPER ATTAC;  Surgeon: Leona Carry, MD;  Location: MAIN OR [facility];  Service: Transplant    PR TRANSPLANT,PREP DONOR PANCREAS N/A 05/03/2019    Procedure: BACKBENCH STANDARD PREPARATION OF CADAVER DONOR PANCREAS ALLOGRAFT PRIOR TO TRANSPLANTATION;  Surgeon: Leona Carry, MD;  Location: MAIN OR [facility];  Service: Transplant    PR TRANSPLANTATION OF KIDNEY N/A 05/03/2019    Procedure: RENAL ALLOTRANSPLANTATION, IMPLANTATION OF GRAFT; WITHOUT RECIPIENT NEPHRECTOMY;  Surgeon: Leona Carry, MD;  Location: [facility] OR [facility];  Service: Transplant    TONSILLECTOMY         No current facility-administered medications for this encounter.  Current Outpatient Medications:     azathioprine (IMURAN) 50 mg tablet, Take 2 tablets (100 mg total) by mouth daily, Disp: 60 tablet, Rfl: 11    escitalopram oxalate (LEXAPRO) 5 MG tablet, Take 1 tablet (5 mg total) by mouth daily., Disp: , Rfl:     sodium bicarbonate 650 mg tablet, Take 2 tablets (1,300 mg total) by mouth Three (3) times a day., Disp: 180 tablet, Rfl: 3    tacrolimus (ENVARSUS XR) 1 mg Tb24 extended release tablet, Take 2 tablets (2 mg total) by mouth daily with FOUR 4mg  tablets for total daily dose of 18mg , Disp: 60 tablet, Rfl: 0    tacrolimus (ENVARSUS XR) 4 mg Tb24 extended release tablet, Take 4 tablets (16 mg total) by mouth daily. Take with two 1 mg tablets for a total of 18 mg, Disp: 120 tablet, Rfl: 11    Allergies  Iodinated contrast media, Nickel, Privigen [immun glob g(igg)-pro-iga 0-50], Propranolol, Eye irrigating solution [ophthalmic irrigation solution], Iodine, Naltrexone, and Uni-cortrom    Family History  Family History   Problem Relation Age of Onset    Diabetes Mother     Diabetes Father     Cancer Maternal Grandmother     Diabetes Maternal Grandfather     Diabetes Paternal Grandmother        Social History  Social History     Tobacco Use    Smoking status: Never    Smokeless tobacco: Never   Vaping Use    Vaping status: Never Used   Substance Use Topics    Alcohol use: Never    Drug use: Never        Physical Exam     VITAL SIGNS:      Vitals:    11/03/22 1215 11/03/22 1227 11/03/22 1750 11/03/22 1950   BP:  141/87 136/79 140/81   Pulse: 99 89 80 76   Resp:  16 16 16    Temp:  37.3 ??C (99.1 ??F) 37.1 ??C (98.8 ??F)    TempSrc:  Oral Oral    SpO2: 99% 99% 100% 100%   Weight:  72.6 kg (160 lb)         Constitutional: Alert and oriented. No acute distress.  Eyes: Conjunctivae are normal.  HEENT: Normocephalic and atraumatic. Conjunctivae clear. No congestion. Moist mucous membranes.   Cardiovascular: Rate as above, regular rhythm. Normal and symmetric distal pulses. Brisk capillary refill. Normal skin turgor.  Respiratory: Normal respiratory effort. Breath sounds are normal. There are no wheezing or crackles heard.  Gastrointestinal: No skin changes visualized. BS regular x4. Soft, non-distended, mild tenderness to palpation of left lower quadrant, no guarding, rigidity, rebound tenderness.  Genitourinary: Deferred.  Musculoskeletal: Non-tender with normal range of motion in all extremities.  Neurologic: Normal speech and language. No gross focal neurologic deficits are appreciated. Patient is moving all extremities equally, face is symmetric at rest and with speech. Extraocular movements intact without nystagmus, pupils are 2mm and equally reactive bilaterally, cranial nerves II through XII intact, strength 5 out of 5 in bilateral upper and lower extremities, distal sensation intact in bilateral upper and lower extremities to soft touch, gait normal and stable.   Skin: Skin is warm, dry and intact. No rash noted.  Psychiatric: Mood and affect are normal. Speech and behavior are normal.     Radiology     US OB Transvaginal   Final Result       Intrauterine pregnancy with findings concerning for early pregnancy loss.  Clinical management and follow up per obstetrical team, which can include ectopic precautions, trending of hCG, and follow up ultrasound as indicated.         Please note that this examination was not performed for purposes of assessing fetal anatomy and/or the placenta and is not diagnostic for these purposes. This is not a surrogate for and should not replace dedicated fetal anatomy scan.         US OB Less Than 14 Wks Transabd 1st Tri Single Gest    (Results Pending)       Pertinent labs & imaging results that were available during my care of the patient were independently interpreted by me and considered in my medical decision making (see chart for details).    Portions of this record have been created using Scientist, clinical (histocompatibility and immunogenetics). Dictation errors have been sought, but may not have been identified and corrected.         Darliss [place], [place]  11/03/22 2236

## 2022-11-03 NOTE — Unmapped (Signed)
Patient paged transplant on-call, reported positive at-home pregnancy test. Patient is not on MMF, current immunosuppression is Envarsus and Azathioprine. Also, reports diarrhea for a few days, headaches, an spotting for about 2 weeks. Patient will go to [facility] ED for further evaluation today and primary TNC will be notified.

## 2022-11-03 NOTE — Unmapped (Signed)
Pt here d/t multiple concerns. Reports intermittent diarrhea X months. Had first consult virtually with GI last week for this; labs have been ordered by GI but have not been collected yet.    Additionally, c/o vaginal spotting consistently X 2 weeks as well as headache X 2 weeks that is relieved by tylenol but persists after tylenol has worn off. She took a pregnancy test today which was positive but unable to definitively recall LMP d/t irregular periods. H/o kidney and pancreas transplant.

## 2022-11-03 NOTE — Unmapped (Signed)
Patient coming in for intractable HA x 4 days. Endorses recent positive pregnancy test. Some cramping and spotting. Kidney and pancreas transplant patient.

## 2022-11-04 DIAGNOSIS — Z79899 Other long term (current) drug therapy: Principal | ICD-10-CM

## 2022-11-04 DIAGNOSIS — Z940 Kidney transplant status: Principal | ICD-10-CM

## 2022-11-04 NOTE — Unmapped (Signed)
Gynecology On Call Telephone Note    Spoke with Colon Flattery, PA about this patient.    Elaine Koch is a 37 y.o. [phone] who presented to ED for abdominal pain and vaginal spotting in the setting of +UPT.    Patient has h/o CKD s/p renal and pancreas transplant. ED provider unsure if desired pregnancy.     Hgb 8.8 (Baseline 8-9)  Rh Pos    TVUS reviewed: G, YS, FP present, no FCA visualized.   FP measuring 6.7 mm      1) Intrauterine Pregnancy of Unknown Viability:  - In setting of intrauterine pregnancy and hemodynamically stable patient, I will order an outpatient viability ultrasound in 10-14 days.  - Recommend bleeding precautions  - Discussed with ED provider ascertaining patient's wishes regarding pregnancy and if she desires termination if pregnancy is viable.     Rh Status: Positive, no rhogam indicated

## 2022-11-11 DIAGNOSIS — Z79899 Other long term (current) drug therapy: Principal | ICD-10-CM

## 2022-11-11 DIAGNOSIS — Z940 Kidney transplant status: Principal | ICD-10-CM

## 2022-11-12 DIAGNOSIS — D708 Other neutropenia: Principal | ICD-10-CM

## 2022-11-13 NOTE — Unmapped (Signed)
Complex Case Management  SUMMARY NOTE    High Risk Care Coordinator  spoke with patient and verified correct patient using two identifiers today to introduce the Complex Case Management program.     Discussed the following:  Program Services, Expectations of participation, and Verified Demographics    Program status: Interested    Discuss at next visit: Introduction to Complex Case Management    Scheduled for: 11/14/22 at 11:00am with High Risk Care Coordinator       Elaine Koch - Care Management Assistant  [facility]  [address]  [place], [place] [address]  P: [phone] F: [phone]  [email or web address]

## 2022-11-14 NOTE — Unmapped (Signed)
Complex Case Management  SUMMARY NOTE    Attempted to contact pt today at Cell number to introduce Complex Case Management services. Left message to return call.; 1st attempt    Discuss at next visit: Complete Pre-Assessment      Deliah Goody - Care Management Assistant  [facility] Clinical Services  [address]  [place], [place] [address]  P: [phone] F: [phone]  [email or web address]

## 2022-11-15 LAB — PROTEIN / CREATININE RATIO, URINE
CREATININE URINE: 92.2 mg/dL
PROTEIN URINE: 8.3 mg/dL
PROTEIN/CREAT RATIO: 90 mg/g{creat} (ref 0–200)

## 2022-11-15 LAB — RENAL FUNCTION PANEL
ALBUMIN: 4.1 g/dL (ref 3.9–4.9)
BLOOD UREA NITROGEN: 32 mg/dL — ABNORMAL HIGH (ref 6–20)
BUN / CREAT RATIO: 15 (ref 9–23)
CALCIUM: 8.7 mg/dL (ref 8.7–10.2)
CHLORIDE: 112 mmol/L — ABNORMAL HIGH (ref 96–106)
CO2: 16 mmol/L — ABNORMAL LOW (ref 20–29)
CREATININE: 2.15 mg/dL — ABNORMAL HIGH (ref 0.57–1.00)
EGFR: 30 mL/min/{1.73_m2} — ABNORMAL LOW
GLUCOSE: 96 mg/dL (ref 70–99)
PHOSPHORUS, SERUM: 4 mg/dL (ref 3.0–4.3)
POTASSIUM: 5.4 mmol/L — ABNORMAL HIGH (ref 3.5–5.2)
SODIUM: 138 mmol/L (ref 134–144)

## 2022-11-15 LAB — URINALYSIS WITH MICROSCOPY
BILIRUBIN UA: NEGATIVE
BLOOD UA: NEGATIVE
GLUCOSE UA: NEGATIVE
KETONES UA: NEGATIVE
LEUKOCYTE ESTERASE UA: NEGATIVE
NITRITE UA: NEGATIVE
PH UA: 6 (ref 5.0–7.5)
SPECIFIC GRAVITY UA: 1.014 (ref 1.005–1.030)
UROBILINOGEN UA: 0.2 mg/dL (ref 0.2–1.0)

## 2022-11-15 LAB — MICROSCOPIC EXAMINATION
BACTERIA: NONE SEEN
CASTS: NONE SEEN /LPF
RBC URINE: NONE SEEN /HPF (ref 0–2)
WBC URINE: NONE SEEN /HPF (ref 0–5)

## 2022-11-15 LAB — MAGNESIUM: MAGNESIUM: 1.9 mg/dL (ref 1.6–2.3)

## 2022-11-16 LAB — CBC W/ DIFFERENTIAL
BANDED NEUTROPHILS ABSOLUTE COUNT: 0 10*3/uL (ref 0.0–0.1)
BASOPHILS ABSOLUTE COUNT: 0 10*3/uL (ref 0.0–0.2)
BASOPHILS RELATIVE PERCENT: 1 %
EOSINOPHILS ABSOLUTE COUNT: 0.2 10*3/uL (ref 0.0–0.4)
EOSINOPHILS RELATIVE PERCENT: 9 %
HEMATOCRIT: 23.8 % — ABNORMAL LOW (ref 34.0–46.6)
HEMOGLOBIN: 8.1 g/dL — ABNORMAL LOW (ref 11.1–15.9)
IMMATURE GRANULOCYTES: 2 %
LYMPHOCYTES ABSOLUTE COUNT: 0.4 10*3/uL — ABNORMAL LOW (ref 0.7–3.1)
LYMPHOCYTES RELATIVE PERCENT: 20 %
MEAN CORPUSCULAR HEMOGLOBIN CONC: 34 g/dL (ref 31.5–35.7)
MEAN CORPUSCULAR HEMOGLOBIN: 37.5 pg — ABNORMAL HIGH (ref 26.6–33.0)
MEAN CORPUSCULAR VOLUME: 110 fL — ABNORMAL HIGH (ref 79–97)
MONOCYTES ABSOLUTE COUNT: 0.2 10*3/uL (ref 0.1–0.9)
MONOCYTES RELATIVE PERCENT: 8 %
NEUTROPHILS ABSOLUTE COUNT: 1.3 10*3/uL — ABNORMAL LOW (ref 1.4–7.0)
NEUTROPHILS RELATIVE PERCENT: 60 %
PLATELET COUNT: 249 10*3/uL (ref 150–450)
RED BLOOD CELL COUNT: 2.16 x10E6/uL — CL (ref 3.77–5.28)
RED CELL DISTRIBUTION WIDTH: 14.4 % (ref 11.7–15.4)
WHITE BLOOD CELL COUNT: 2 10*3/uL — CL (ref 3.4–10.8)

## 2022-11-17 LAB — TACROLIMUS LEVEL: TACROLIMUS BLOOD: 6.3 ng/mL (ref 2.0–20.0)

## 2022-11-18 ENCOUNTER — Ambulatory Visit: Admit: 2022-11-18 | Discharge: 2022-11-19 | Payer: MEDICARE

## 2022-11-18 ENCOUNTER — Other Ambulatory Visit: Admit: 2022-11-18 | Discharge: 2022-11-19 | Payer: MEDICARE

## 2022-11-18 DIAGNOSIS — O021 Missed abortion: Principal | ICD-10-CM

## 2022-11-18 DIAGNOSIS — O4690 Antepartum hemorrhage, unspecified, unspecified trimester: Principal | ICD-10-CM

## 2022-11-18 NOTE — Unmapped (Signed)
Dear Modena Nunnery, PA     Thank you very much for consulting me regarding Ms. Dragos.         She is an 37 y.o. [id number]  at [redacted]w[redacted]d who is seen for a missed abortion. She has a complex medical history that includes a renal/pancreas transplant secondary to T1DM.       Non-Viable Pregnancy     Today's study demonstrates criteria diagnostic of a non viable first trimester pregnancy. She had a viability [place] on 11/03/22 that showed an IUP with a CRL of 6.66mm with no fetal heart rate. She now presents for a follow up viable and the CRL is the 5.67mm and there is no fetal heart rate. No yolk sac is seen today.      Additionally, there appears to be an incomplete uterine septum.      I discussed options with the patient including expectant management, medical management, and surgical management. She desires a D and C. I communicated with the inpatient gyn team who will get the patient scheduled with CFP due to her complex medical comorbidities.      She reports some spotting but no heavy vaginal bleeding. She was given bleeding precautions.      After her D and C, consider evaluation with an SIS and preconception counseling with MFM.           This encounter was based on medical decision making. Number and complexity of problems addressed: Moderate: 1 undiagnosed new problem with uncertain prognosis, amount and/or complexity of data to be reviewed and analyzed: Review of prior external note(s) from each unique source: problem list in Epic   Review of the result(s) of each unique test: CBC  Discussion of management or test interpretation with external physician/other qualified health care professional/appropriate source (not separately reported): GYN team, risk of complications and/or morbidity or mortality of patient management: Moderate: Decision regarding amniocentesis or CVS, a minor surgery with identified risk factors, MDM LOS: moderate.    Thank you for allowing me to participate in the care of your patient. Sincerely,     Mitzi Hansen, MD

## 2022-11-18 NOTE — Unmapped (Signed)
Complex Case Management  SUMMARY NOTE    Attempted to contact pt today at Cell number to follow up for Complex Case Management services. Left message to return call.; 2nd attempt, letter sent.    Discuss at next visit: No answer, letter sent      Deliah Goody - Care Management Assistant  [facility] Clinical Services  [address], [address]  [place], [place] [address]  P: [phone] F: [phone]  [email or web address]

## 2022-11-19 ENCOUNTER — Encounter: Admit: 2022-11-19 | Discharge: 2022-11-19 | Payer: MEDICARE

## 2022-11-19 ENCOUNTER — Ambulatory Visit: Admit: 2022-11-19 | Discharge: 2022-11-19 | Payer: MEDICARE

## 2022-11-19 LAB — CBC
HEMATOCRIT: 23.1 % — ABNORMAL LOW (ref 34.0–44.0)
HEMOGLOBIN: 8.1 g/dL — ABNORMAL LOW (ref 11.3–14.9)
MEAN CORPUSCULAR HEMOGLOBIN CONC: 34.8 g/dL (ref 32.0–36.0)
MEAN CORPUSCULAR HEMOGLOBIN: 37.5 pg — ABNORMAL HIGH (ref 25.9–32.4)
MEAN CORPUSCULAR VOLUME: 107.9 fL — ABNORMAL HIGH (ref 77.6–95.7)
MEAN PLATELET VOLUME: 7.9 fL (ref 6.8–10.7)
PLATELET COUNT: 227 10*9/L (ref 150–450)
RED BLOOD CELL COUNT: 2.14 10*12/L — ABNORMAL LOW (ref 3.95–5.13)
RED CELL DISTRIBUTION WIDTH: 15 % (ref 12.2–15.2)
WBC ADJUSTED: 1.7 10*9/L — ABNORMAL LOW (ref 3.6–11.2)

## 2022-11-19 LAB — RENAL FUNCTION PANEL
ALBUMIN: 4.1 g/dL (ref 3.4–5.0)
ANION GAP: 9 mmol/L (ref 5–14)
BLOOD UREA NITROGEN: 35 mg/dL — ABNORMAL HIGH (ref 9–23)
BUN / CREAT RATIO: 16
CALCIUM: 9.4 mg/dL (ref 8.7–10.4)
CHLORIDE: 117 mmol/L — ABNORMAL HIGH (ref 98–107)
CO2: 15.7 mmol/L — ABNORMAL LOW (ref 20.0–31.0)
CREATININE: 2.2 mg/dL — ABNORMAL HIGH
EGFR CKD-EPI (2021) FEMALE: 29 mL/min/{1.73_m2} — ABNORMAL LOW (ref >=60)
GLUCOSE RANDOM: 89 mg/dL (ref 70–99)
PHOSPHORUS: 3.9 mg/dL (ref 2.4–5.1)
POTASSIUM: 5.3 mmol/L — ABNORMAL HIGH (ref 3.4–4.8)
SODIUM: 142 mmol/L (ref 135–145)

## 2022-11-19 LAB — MAGNESIUM: MAGNESIUM: 2 mg/dL (ref 1.6–2.6)

## 2022-11-19 MED ORDER — ACETAMINOPHEN 325 MG TABLET
ORAL_TABLET | ORAL | 2 refills | 9 days | Status: CP | PRN
Start: 2022-11-19 — End: 2023-11-19

## 2022-11-19 MED ADMIN — lidocaine (XYLOCAINE) 10 mg/mL (1 %) injection: @ 18:00:00 | Stop: 2022-11-19

## 2022-11-19 MED ADMIN — fentaNYL (PF) (SUBLIMAZE) injection: INTRAVENOUS | @ 18:00:00 | Stop: 2022-11-19

## 2022-11-19 MED ADMIN — ondansetron (ZOFRAN) injection: INTRAVENOUS | @ 18:00:00 | Stop: 2022-11-19

## 2022-11-19 MED ADMIN — dexAMETHasone (DECADRON) 4 mg/mL injection: INTRAVENOUS | @ 18:00:00 | Stop: 2022-11-19

## 2022-11-19 MED ADMIN — fentaNYL (PF) (SUBLIMAZE) injection 25 mcg: 25 ug | INTRAVENOUS | @ 19:00:00 | Stop: 2022-11-19

## 2022-11-19 MED ADMIN — oxyCODONE (ROXICODONE) immediate release tablet 5 mg: 5 mg | ORAL | @ 19:00:00 | Stop: 2022-11-19

## 2022-11-19 MED ADMIN — acetaminophen (TYLENOL) tablet 1,000 mg: 1000 mg | ORAL | @ 17:00:00 | Stop: 2022-11-19

## 2022-11-19 MED ADMIN — Propofol (DIPRIVAN) injection: INTRAVENOUS | @ 18:00:00 | Stop: 2022-11-19

## 2022-11-19 MED ADMIN — azithromycin (ZITHROMAX) tablet 500 mg: 500 mg | ORAL | @ 17:00:00 | Stop: 2022-11-19

## 2022-11-19 MED ADMIN — midazolam (VERSED) injection: INTRAVENOUS | @ 18:00:00 | Stop: 2022-11-19

## 2022-11-19 MED ADMIN — lidocaine (PF) (XYLOCAINE-MPF) 20 mg/mL (2 %) injection: INTRAVENOUS | @ 18:00:00 | Stop: 2022-11-19

## 2022-11-19 NOTE — Unmapped (Signed)
Hello!     Can we please schedule this patient for a preconception counseling visit? She has a history of renal and pancreas transplant for T1DM, recent missed Ab, Desires fertility. Referral has been placed.     Thank you!     Paulita Cradle, MD   Obstetrics and Gynecology PGY-2

## 2022-11-19 NOTE — Unmapped (Signed)
OPERATIVE NOTE FOR STANDARD UTERINE VACUUM ASPIRATION    Patient: Elaine Koch  Medical Record Number: [id number]  Date of Surgery: 11/19/22  Surgeon: Blima Rich , MD  (Attending Physician)  Assistant(s): Cari Caraway, MD (Family Planning Fellow), Paulita Cradle, MD (Resident Physician)    Procedure: Standard Uterine Vacuum Aspiration     Indications:  [name] is a 37 y.o. [phone] at with early missed abortion who desires uterine vacuum aspiration for   early pregnancy loss    For contraception, patient desires none    Anesthesia: MAC    IVF: 0 mL  UOP: 50 mL  EBL: 25 mL    Findings:  1. Gritty texture of uterus following procedure  2. Products of conception consistent with gestational age seen on gross examination of specimen    Details: The patient received azithromycin for surgical prophylaxis in the preoperative area. She was taken to the operating room where she was placed under anesthesia without complication. She was placed in dorsal lithotomy and prepped with betadine solution. A straight catheterization was performed. She was draped in the normal sterile fashion.  A speculum was placed in the patient's vagina. A single-tooth tenaculum was then applied to the cervix. A paracervical block was then placed using 20mL of 1 % lidocaine. The cervix was then dilated using Pratt dilators to a size 27  [place]. The size 9 suction curette was advanced under ultrasound guidance, and the contents of the uterus were removed.  An 7 mm suction cannula was also used until a gritty texture was noted by the surgeon and a thin endometrial stripe was noted on ultrasound.      The tenaculum was removed from the anterior lip of the patient's cervix, which was noted to be hemostatic. The speculum was then removed from the patient's vagina.     The products of conception were examined and found to be consistent with an early missed abortion. Gestational sac seen.     The patient tolerated the procedure well. Sponge, lap, and needle counts were correct.     The patient was taken to the recovery room in stable condition.     Complications: none     Condition: stable to RR    Specimens:  POC--> Disposal

## 2022-11-19 NOTE — Unmapped (Signed)
The patient reports they are physically located in [place] and is currently: at home. I conducted a phone visit.  I spent 15 minutes on the phone call with the patient on the date of service .     [facility] Note    ASSESSMENT/PLAN  Elaine Koch is a 37 y.o. female [phone] w/ PMH DMI, ESRD s/p renal and pancreatic transplant, HTN with missed ab measuring 6 wga for which uterine vacuum aspiration is indicated. Discussed case with anesthesia who agrees she is appropriate candidate for procedure at [facility]. Imaging notable for possible incomplete uterine septum.    Pre-operative planning:  Patient given time and place to return for procedure, discussed NPO status.  Rh Status: O POS  Hgb: 8.1  Plan to repeat CBC, renal panel, mag in preop    Procedure counseling/informed consent process:  The risks, benefits, and alternatives to uterine aspiration were discussed.  Procedure risks including but not limited to hemorrhage, infection, perforation, retained products of conception, possible diagnostic laparascopy, possible laparotomy, and possible hysterectomy and even, rarely, death, were reviewed. The patient voiced understanding and wishes to proceed with standard uterine aspiration.    Contraception Counseling:  Declines at this time     SUBJECTIVE  CC: Missed abortion  HPI: Elaine Koch is a 37 y.o. female is a [phone] with missed ab at 6 wks, diagnosed today. She notes some spotting but is otherwise doing well without complaint.     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     PSURGHX:   Past Surgical History:   Procedure Laterality Date    AV FISTULA PLACEMENT  2018    CESAREAN SECTION      PR COLONOSCOPY FLX DX W/COLLJ SPEC WHEN PFRMD N/A 02/14/2020    Procedure: COLONOSCOPY, FLEXIBLE, PROXIMAL TO SPLENIC FLEXURE; DIAGNOSTIC, W/WO COLLECTION SPECIMEN BY BRUSH OR WASH;  Surgeon: Carmon Ginsberg, MD;  Location: GI PROCEDURES [facility];  Service: Gastroenterology    PR COLONOSCOPY FLX DX W/COLLJ SPEC WHEN PFRMD N/A 06/19/2020    Procedure: COLONOSCOPY, FLEXIBLE, PROXIMAL TO SPLENIC FLEXURE; DIAGNOSTIC, W/WO COLLECTION SPECIMEN BY BRUSH OR WASH;  Surgeon: Carmon Ginsberg, MD;  Location: GI PROCEDURES [facility];  Service: Gastroenterology    PR FECAL MICROBIOTA PREP INSTIL N/A 02/14/2020    Procedure: PREP W INSTILLATION FECAL MICROBIOTA, ANY METHOD;  Surgeon: Michelene Heady McGill,  MD;  Location: GI PROCEDURES [facility];  Service: Gastroenterology    PR INDUCED ABORTN BY DIL/EVAC N/A 03/20/2021    Procedure: DILATION AND EVACUATION - INDUCED;  Surgeon: Gaynelle Cage, MD;  Location: [facility] OR [facility];  Service: Family Planning    PR LAP,DIAGNOSTIC ABDOMEN N/A 11/17/2020    Procedure: DIAGNOSTIC AND OR OPERATIVE LAPAROSCOPY;  Surgeon: Estil Daft, MD;  Location: MAIN OR [facility];  Service: Womens Primary Gynecology    PR PREPARE FECAL MICROBIOTA FOR INSTILLATION  06/19/2020    Procedure: PREP FECAL MICROBIOTA FOR INSTILLATION, INCLUDING ASSESSMENT OF DONOR SPECIMEN;  Surgeon: Carmon Ginsberg, MD;  Location: [facility]URES [facility];  Service: Gastroenterology    PR SURG RX MISSED ABORTN,1ST TRI N/A 11/17/2020    Procedure: VACUUM ASPIRATION;  Surgeon: Estil Daft, MD;  Location: MAIN OR [facility];  Service: [facility] Primary Gynecology    PR TRANSPLANT ALLOGRAFT PANCREAS N/A 05/03/2019    Procedure: TRANSPLANTATION OF PANCREATIC ALLOGRAFT;  Surgeon: Leona Carry, MD;  Location: MAIN OR [facility];  Service: Transplant    PR TRANSPLANT,PREP CADAVER RENAL GRAFT N/A 05/03/2019    Procedure: [facility] STD PREP CAD DONR RENAL ALLOGFT PRIOR TO TRNSPLNT, INCL DISSEC/REM PERINEPH FAT, DIAPH/RTPER ATTAC;  Surgeon: Leona Carry, MD;  Location: MAIN OR [facility];  Service: Transplant    PR TRANSPLANT,PREP DONOR PANCREAS N/A 05/03/2019    Procedure: BACKBENCH STANDARD PREPARATION OF CADAVER DONOR PANCREAS ALLOGRAFT PRIOR TO TRANSPLANTATION;  Surgeon: Leona Carry, MD;  Location: MAIN OR [facility];  Service: Transplant    PR TRANSPLANTATION OF KIDNEY N/A 05/03/2019    Procedure: RENAL ALLOTRANSPLANTATION, IMPLANTATION OF GRAFT; WITHOUT RECIPIENT NEPHRECTOMY;  Surgeon: Leona Carry, MD;  Location: MAIN OR [facility];  Service: Transplant    TONSILLECTOMY       MEDS:  Current Outpatient Medications:     azathioprine (IMURAN) 50 mg tablet, Take 2 tablets (100 mg total) by mouth daily, Disp: 60 tablet, Rfl: 11    escitalopram oxalate (LEXAPRO) 5 MG tablet, Take 1 tablet (5 mg total) by mouth daily., Disp: , Rfl:     sodium bicarbonate 650 mg tablet, Take 2 tablets (1,300 mg total) by mouth Three (3) times a day., Disp: 180 tablet, Rfl: 3    tacrolimus (ENVARSUS XR) 1 mg Tb24 extended release tablet, Take 2 tablets (2 mg total) by mouth daily with FOUR 4mg  tablets for total daily dose of 18mg , Disp: 60 tablet, Rfl: 0    tacrolimus (ENVARSUS XR) 4 mg Tb24 extended release tablet, Take 4 tablets (16 mg total) by mouth daily. Take with two 1 mg tablets for a total of 18 mg, Disp: 120 tablet, Rfl: 11   ALLERGIES:is allergic to iodinated contrast media, nickel, privigen [immun glob g(igg)-pro-iga 0-50], propranolol, eye irrigating solution [ophthalmic irrigation solution], iodine, naltrexone, and uni-cortrom.    OBJECTIVE  Appropriate affect      Ultrasound 10/7:   Impression ============      Non-Viable Pregnancy      Today's study demonstrates criteria diagnostic of a non viable first trimester pregnancy. She had a viability [place] on 11/03/22 that   showed an IUP with a CRL of 6.4mm with no fetal heart rate. She now presents for a follow up viable and the CRL is the 5.3mm and   there is no fetal heart rate. No yolk sac is seen today.      Additionally, there appears to be an incomplete uterine septum.      I discussed options  with the patient including expectant management, medical management, and surgical management. She desires   a D and C. I communicated with the inpatient gyn team who will get the patient scheduled with [facility] due to her complex medical   comorbidities.      She reports some spotting but no heavy vaginal bleeding. She was given bleeding precautions.      After her D and C, consider evaluation with an SIS and preconception counseling with MFM.      Coding   ======      Diagnoses                   O02.1: Missed abortion   Procedures                 [phone]: 1st Trimester Ultrasound     Narrative      Indication   ========      Viability: Follow up from ED ultrasound: AMA 37, T1DM, CKD, Hx CS, Hx pre-eclampsia, Hx PTD @ 27 weeks, cardiomyopathy,   SCT, Hx of left kidney and pancreas transplant      Method   ======      Transabdominal ultrasound examination. View: Good view      Pregnancy   =========      Singleton pregnancy. Number of embryos: 1      Dating   ======      Cycle: LMP date not known   Ultrasound examination on: 11/18/2022   GA by U/S based upon: CRL   GA by U/S 6 w + 3 d   EDD by U/S: 07/11/2023   Assigned: based on ultrasound (CRL), selected on 11/18/2022   Assigned GA 6 w + 3 d   Assigned EDD: 07/11/2023      Assessment   ==========      Gestational sac: visualized   GS 30.1 mm 8w 1d 98% Rempen   Location: intrauterine   Yolk sac: uncertain   Embryo: visualized   CRL 5.9 mm 6w 3d 40% [place]   Cardiac activity: absent      General Evaluation   ============== Cardiac activity absent   Amniotic fluid: normal amount      Fetal Biometry   ============      Standard   CRL 5.9 mm 6w 3d 40% Hadlock   Extended   GS 30.1 mm 8w 1d 98% Rempen      Maternal Structures   ===============      Uterus / Cervix   Uterus: Normal   Cervix: Normal   Ovaries / Tubes / Adnexa   Rt ovary: Normal   Rt ovary D1 41 mm   Rt ovary D2 18 mm   Rt ovary D3 14 mm   Rt ovary Vol 5.3 cm?   Lt ovary: Normal   Lt ovary D1 42 mm   Lt ovary D2 30 mm   Lt ovary D3 22 mm   Lt ovary Vol 14.6 cm?   Cul de Sac / Bladder / Kidneys / Other   Cul de Sac: Normal   Free fluid: No free fluid visualized   Kidney left appearance: Visualized   Kidney left other: left pelvic kidney- transplant         .

## 2022-11-19 NOTE — Unmapped (Signed)
Brief Pre-operative History & Physical    Patient name: Elaine Koch  CSN: [id number]  MRN: [id number]  Admit Date: 11/19/2022  Date of Surgery: 11/19/2022  Performing Service: [facility] Primary Gynecology    Code Status: Full Code      Assessment/Plan:      [name] is a 37 y.o. female with missed abortion, who presents for:  Procedure(s) (LRB):  TREATMENT OF MISSED ABORTION, COMPLETED SURGICALLY; FIRST TRIMESTER (N/A).     She presents for:  Procedure(s) (LRB):  TREATMENT OF MISSED ABORTION, COMPLETED SURGICALLY; FIRST TRIMESTER (N/A).     Consent obtained in office is accurate. Risks, benefits, and alternatives to surgery were reviewed, and all questions were answered.    Proceed to the OR as planned.         History of Present Illness:    Elaine Koch is a 37 y.o. female with missed abortion. She was recently seen in clinic, where a detailed HPI can be found. She was noted to benefit from:  Procedure(s) (LRB):  TREATMENT OF MISSED ABORTION, COMPLETED SURGICALLY; FIRST TRIMESTER (N/A).       Allergies  Iodinated contrast media, Nickel, Privigen [immun glob g(igg)-pro-iga 0-50], Propranolol, Eye irrigating solution [ophthalmic irrigation solution], Iodine, Naltrexone, and Uni-cortrom    Home Medications    Current Facility-Administered Medications   Medication Dose Route Frequency Provider Last Rate Last Admin    acetaminophen (TYLENOL) tablet 1,000 mg  1,000 mg Oral Once Ramsdell, Arliss Journey, MD        azithromycin [facility]) tablet 500 mg  500 mg Oral Once Cari Caraway, MD        lactated Ringers infusion  10 mL/hr Intravenous Continuous Cari Caraway, MD           Vital Signs  BP 132/75  - Pulse 82  - Temp 36.4 ??C (97.5 ??F) (Temporal)  - Resp 15  - LMP  (LMP Unknown)  - SpO2 100%   Facility age limit for growth %iles is 20 years.  Facility age limit for growth %iles is 20 years..     Physical Exam  General: Well developed, appears stated age, in no acute distress  Mental status: Alert and oriented x3  Cardiovascular: Normal  Pulmonary: Symmetric chest rise, unlabored breathing  Relevant System for Surgery: Surgical site examined    Labs and Studies:  Lab Results   Component Value Date    WBC 2.0 (LL) 11/14/2022    HGB 8.1 (L) 11/14/2022    HCT 23.8 (L) 11/14/2022    PLT 249 11/14/2022       Lab Results   Component Value Date    PT 11.7 08/16/2022    INR 1.05 08/16/2022    APTT 36.7 08/16/2022       Relevant Past Medical History:  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       Relevant Past Surgical History:  Past Surgical History:   Procedure Laterality Date    AV FISTULA PLACEMENT  2018    CESAREAN SECTION      PR COLONOSCOPY FLX DX W/COLLJ SPEC WHEN PFRMD N/A 02/14/2020    Procedure: COLONOSCOPY, FLEXIBLE, PROXIMAL TO SPLENIC  FLEXURE; DIAGNOSTIC, W/WO COLLECTION SPECIMEN BY BRUSH OR WASH;  Surgeon: Carmon Ginsberg, MD;  Location: GI [facility]URES [facility];  Service: Gastroenterology    PR COLONOSCOPY FLX DX W/COLLJ SPEC WHEN PFRMD N/A 06/19/2020    Procedure: COLONOSCOPY, FLEXIBLE, PROXIMAL TO SPLENIC FLEXURE; DIAGNOSTIC, W/WO COLLECTION SPECIMEN BY BRUSH OR WASH;  Surgeon: Carmon Ginsberg, MD;  Location: GI PROCEDURES [facility];  Service: Gastroenterology    PR FECAL MICROBIOTA PREP INSTIL N/A 02/14/2020    Procedure: PREP W INSTILLATION FECAL MICROBIOTA, ANY METHOD;  Surgeon: Carmon Ginsberg, MD;  Location: GI PROC[facility]URES [facility];  Service: Gastroenterology    PR INDUCED ABORTN BY DIL/EVAC N/A 03/20/2021    Procedure: DILATION AND EVACUATION - INDUCED;  Surgeon: Gaynelle Cage, MD;  Location: [facility] OR [facility];  Service: Family Planning    PR LAP,DIAGNOSTIC ABDOMEN N/A 11/17/2020    Procedure: DIAGNOSTIC AND OR OPERATIVE LAPAROSCOPY;  Surgeon: Estil Daft, MD;  Location: MAIN OR [facility];  Service: Womens Primary Gynecology    PR PREPARE FECAL MICROBIOTA FOR INSTILLATION  06/19/2020    Procedure: PREP FECAL MICROBIOTA FOR INSTILLATION, INCLUDING ASSESSMENT OF DONOR SPECIMEN;  Surgeon: Carmon Ginsberg, MD;  Location: [facility]URES [facility];  Service: Gastroenterology    PR SURG RX MISSED ABORTN,1ST TRI N/A 11/17/2020    Procedure: VACUUM ASPIRATION;  Surgeon: Estil Daft, MD;  Location: MAIN OR [facility];  Service: [facility] Primary Gynecology    PR TRANSPLANT ALLOGRAFT PANCREAS N/A 05/03/2019    Procedure: TRANSPLANTATION OF PANCREATIC ALLOGRAFT;  Surgeon: Leona Carry, MD;  Location: MAIN OR [facility];  Service: Transplant    PR TRANSPLANT,PREP CADAVER RENAL GRAFT N/A 05/03/2019    Procedure: [facility] STD PREP CAD DONR RENAL ALLOGFT PRIOR TO TRNSPLNT, INCL DISSEC/REM PERINEPH FAT, DIAPH/RTPER ATTAC;  Surgeon: Leona Carry, MD;  Location: MAIN OR [facility];  Service: Transplant    PR TRANSPLANT,PREP DONOR PANCREAS N/A 05/03/2019    Procedure: BACKBENCH STANDARD PREPARATION OF CADAVER DONOR PANCREAS ALLOGRAFT PRIOR TO TRANSPLANTATION;  Surgeon: Leona Carry, MD;  Location: MAIN OR [facility];  Service: Transplant    PR TRANSPLANTATION OF KIDNEY N/A 05/03/2019    Procedure: RENAL ALLOTRANSPLANTATION, IMPLANTATION OF GRAFT; WITHOUT RECIPIENT NEPHRECTOMY;  Surgeon: Leona Carry, MD;  Location: MAIN OR [facility];  Service: Transplant    TONSILLECTOMY           Paulita Cradle, PGY-2  Obstetrics and Gynecology  [facility] [facility] [place]

## 2022-11-20 NOTE — Unmapped (Signed)
Opened in error

## 2022-11-20 NOTE — Unmapped (Signed)
No post op call. PT vac asp.

## 2022-11-24 NOTE — Unmapped (Signed)
Call from pt @ 1100, s/p D&C on 10/8 @ [facility], POD 2-3 cramping and bleeding had subsided but this morning they have returned. She did not know who to call. I paged GYN on call for her to address issue. Told her to call me back if no hear from them within the hour.

## 2022-11-30 LAB — CBC W/ DIFFERENTIAL
BANDED NEUTROPHILS ABSOLUTE COUNT: 0 10*3/uL (ref 0.0–0.1)
BASOPHILS ABSOLUTE COUNT: 0 10*3/uL (ref 0.0–0.2)
BASOPHILS RELATIVE PERCENT: 1 %
EOSINOPHILS ABSOLUTE COUNT: 0.2 10*3/uL (ref 0.0–0.4)
EOSINOPHILS RELATIVE PERCENT: 9 %
HEMATOCRIT: 19.9 % — ABNORMAL LOW (ref 34.0–46.6)
HEMOGLOBIN: 6.6 g/dL — CL (ref 11.1–15.9)
IMMATURE GRANULOCYTES: 1 %
LYMPHOCYTES ABSOLUTE COUNT: 0.3 10*3/uL — ABNORMAL LOW (ref 0.7–3.1)
LYMPHOCYTES RELATIVE PERCENT: 18 %
MEAN CORPUSCULAR HEMOGLOBIN CONC: 33.2 g/dL (ref 31.5–35.7)
MEAN CORPUSCULAR HEMOGLOBIN: 35.9 pg — ABNORMAL HIGH (ref 26.6–33.0)
MEAN CORPUSCULAR VOLUME: 108 fL — ABNORMAL HIGH (ref 79–97)
MONOCYTES ABSOLUTE COUNT: 0.2 10*3/uL (ref 0.1–0.9)
MONOCYTES RELATIVE PERCENT: 10 %
NEUTROPHILS ABSOLUTE COUNT: 1 10*3/uL — ABNORMAL LOW (ref 1.4–7.0)
NEUTROPHILS RELATIVE PERCENT: 61 %
PLATELET COUNT: 208 10*3/uL (ref 150–450)
RED BLOOD CELL COUNT: 1.84 x10E6/uL — CL (ref 3.77–5.28)
RED CELL DISTRIBUTION WIDTH: 14.6 % (ref 11.7–15.4)
WHITE BLOOD CELL COUNT: 1.7 10*3/uL — CL (ref 3.4–10.8)

## 2022-11-30 LAB — RENAL FUNCTION PANEL
ALBUMIN: 4.2 g/dL (ref 3.9–4.9)
BLOOD UREA NITROGEN: 34 mg/dL — ABNORMAL HIGH (ref 6–20)
BUN / CREAT RATIO: 16 (ref 9–23)
CALCIUM: 9.2 mg/dL (ref 8.7–10.2)
CHLORIDE: 111 mmol/L — ABNORMAL HIGH (ref 96–106)
CO2: 18 mmol/L — ABNORMAL LOW (ref 20–29)
CREATININE: 2.1 mg/dL — ABNORMAL HIGH (ref 0.57–1.00)
EGFR: 31 mL/min/{1.73_m2} — ABNORMAL LOW
GLUCOSE: 88 mg/dL (ref 70–99)
PHOSPHORUS, SERUM: 4.1 mg/dL (ref 3.0–4.3)
POTASSIUM: 5.1 mmol/L (ref 3.5–5.2)
SODIUM: 140 mmol/L (ref 134–144)

## 2022-11-30 LAB — MAGNESIUM: MAGNESIUM: 1.9 mg/dL (ref 1.6–2.3)

## 2022-12-02 ENCOUNTER — Emergency Department
Admit: 2022-12-02 | Discharge: 2022-12-03 | Disposition: A | Discharge status: Home / Self Care | Payer: MEDICARE | Attending: Emergency Medicine

## 2022-12-02 ENCOUNTER — Ambulatory Visit
Admit: 2022-12-02 | Discharge: 2022-12-03 | Disposition: A | Discharge status: Home / Self Care | Payer: MEDICARE | Attending: Emergency Medicine

## 2022-12-02 DIAGNOSIS — D649 Anemia, unspecified: Principal | ICD-10-CM

## 2022-12-02 DIAGNOSIS — R519 Nonintractable headache, unspecified chronicity pattern, unspecified headache type: Principal | ICD-10-CM

## 2022-12-02 DIAGNOSIS — Z79899 Other long term (current) drug therapy: Principal | ICD-10-CM

## 2022-12-02 DIAGNOSIS — Z940 Kidney transplant status: Principal | ICD-10-CM

## 2022-12-02 LAB — CBC W/ AUTO DIFF
BASOPHILS ABSOLUTE COUNT: 0 10*9/L (ref 0.0–0.1)
BASOPHILS RELATIVE PERCENT: 1 %
EOSINOPHILS ABSOLUTE COUNT: 0.2 10*9/L (ref 0.0–0.5)
EOSINOPHILS RELATIVE PERCENT: 12.1 %
HEMATOCRIT: 19.7 % — ABNORMAL LOW (ref 34.0–44.0)
HEMOGLOBIN: 7.1 g/dL — ABNORMAL LOW (ref 11.3–14.9)
LYMPHOCYTES ABSOLUTE COUNT: 0.3 10*9/L — ABNORMAL LOW (ref 1.1–3.6)
LYMPHOCYTES RELATIVE PERCENT: 20.8 %
MEAN CORPUSCULAR HEMOGLOBIN CONC: 35.8 g/dL (ref 32.0–36.0)
MEAN CORPUSCULAR HEMOGLOBIN: 38 pg — ABNORMAL HIGH (ref 25.9–32.4)
MEAN CORPUSCULAR VOLUME: 106 fL — ABNORMAL HIGH (ref 77.6–95.7)
MEAN PLATELET VOLUME: 8.1 fL (ref 6.8–10.7)
MONOCYTES ABSOLUTE COUNT: 0.1 10*9/L — ABNORMAL LOW (ref 0.3–0.8)
MONOCYTES RELATIVE PERCENT: 7.8 %
NEUTROPHILS ABSOLUTE COUNT: 1 10*9/L — ABNORMAL LOW (ref 1.8–7.8)
NEUTROPHILS RELATIVE PERCENT: 58.3 %
PLATELET COUNT: 202 10*9/L (ref 150–450)
RED BLOOD CELL COUNT: 1.86 10*12/L — ABNORMAL LOW (ref 3.95–5.13)
RED CELL DISTRIBUTION WIDTH: 15.4 % — ABNORMAL HIGH (ref 12.2–15.2)
WBC ADJUSTED: 1.7 10*9/L — ABNORMAL LOW (ref 3.6–11.2)

## 2022-12-02 LAB — APTT
APTT: 36.1 s (ref 24.8–38.4)
HEPARIN CORRELATION: 0.2

## 2022-12-02 LAB — COMPREHENSIVE METABOLIC PANEL
ALBUMIN: 3.9 g/dL (ref 3.4–5.0)
ALKALINE PHOSPHATASE: 43 U/L — ABNORMAL LOW (ref 46–116)
ALT (SGPT): 36 U/L (ref 10–49)
ANION GAP: 8 mmol/L (ref 5–14)
AST (SGOT): 31 U/L (ref <=34)
BILIRUBIN TOTAL: 1 mg/dL (ref 0.3–1.2)
BLOOD UREA NITROGEN: 26 mg/dL — ABNORMAL HIGH (ref 9–23)
BUN / CREAT RATIO: 12
CALCIUM: 9.5 mg/dL (ref 8.7–10.4)
CHLORIDE: 110 mmol/L — ABNORMAL HIGH (ref 98–107)
CO2: 24 mmol/L (ref 20.0–31.0)
CREATININE: 2.16 mg/dL — ABNORMAL HIGH
EGFR CKD-EPI (2021) FEMALE: 30 mL/min/{1.73_m2} — ABNORMAL LOW (ref >=60)
GLUCOSE RANDOM: 103 mg/dL (ref 70–179)
POTASSIUM: 5.3 mmol/L — ABNORMAL HIGH (ref 3.5–5.1)
PROTEIN TOTAL: 6.5 g/dL (ref 5.7–8.2)
SODIUM: 142 mmol/L (ref 135–145)

## 2022-12-02 LAB — TACROLIMUS LEVEL: TACROLIMUS BLOOD: 5.8 ng/mL (ref 2.0–20.0)

## 2022-12-02 LAB — PROTIME-INR
INR: 1.11
PROTIME: 12.6 s (ref 9.9–12.6)

## 2022-12-02 LAB — FERRITIN: FERRITIN: 482.6 ng/mL — ABNORMAL HIGH

## 2022-12-02 LAB — IRON PANEL
IRON SATURATION: 27 % (ref 20–55)
IRON: 71 ug/dL
TOTAL IRON BINDING CAPACITY: 266 ug/dL (ref 250–425)

## 2022-12-02 LAB — HIGH SENSITIVITY TROPONIN I - SINGLE: HIGH SENSITIVITY TROPONIN I: 5 ng/L (ref <=34)

## 2022-12-02 MED ADMIN — diphenhydrAMINE (BENADRYL) capsule/tablet 25 mg: 25 mg | ORAL | @ 23:00:00 | Stop: 2022-12-02

## 2022-12-02 MED ADMIN — acetaminophen (TYLENOL) tablet 1,000 mg: 1000 mg | ORAL | @ 23:00:00 | Stop: 2022-12-02

## 2022-12-02 MED ADMIN — fentaNYL (PF) (SUBLIMAZE) injection 25 mcg: 25 ug | INTRAVENOUS | @ 22:00:00 | Stop: 2022-12-02

## 2022-12-02 NOTE — Unmapped (Signed)
Here with hemoglobin of 6.6

## 2022-12-02 NOTE — Unmapped (Signed)
Noted hemoglobin of 6.6, reviewed with Dr. Carlene Coria- send pt to ER.  Called and spoke with Shikira on phone- she reports that she has been very fatigued and had a terrible headache for the last few days. PT was notified that her hemoglobin is very low and she needs to proceed to ER, she will do this.

## 2022-12-02 NOTE — Unmapped (Signed)
Reports mid chest pain that radiates to back, headache and fatigue. Labs on Friday show hgb 6.6 and creat 2.1. reports D&C  on 10/8- blood with wiping still

## 2022-12-03 DIAGNOSIS — Z940 Kidney transplant status: Principal | ICD-10-CM

## 2022-12-03 LAB — TACROLIMUS LEVEL, TROUGH: TACROLIMUS, TROUGH: 17.2 ng/mL — ABNORMAL HIGH (ref 5.0–15.0)

## 2022-12-03 MED ORDER — ENVARSUS XR 1 MG TABLET,EXTENDED RELEASE
ORAL_TABLET | Freq: Every day | ORAL | 0 refills | 30 days
Start: 2022-12-03 — End: ?

## 2022-12-03 NOTE — Unmapped (Signed)
Paged by ED about wanting to ensure elevated Cr is not a concern for rejection.    Brief medical history:    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 difficile, and migraines who presented to the ED for a hemoglobin of 6.6 (baseline 8s).    Patient was discharged from [facility] in July 2024 where she was treated for mild active antibody mediated rejection.  She was initially given IVIG, but was discontinued after side effects.  She ultimately was treated with Solu-Medrol 500 mg IV x 3 days and rituximab on 7/7.  She was transition to p.o. prednisone 10 mg.    Per resident, patient was reporting migraines and chest pain, but denied any infectious symptoms, GI, or urinary symptoms.  Patient is on Envarsus and azathioprine which she reports that she is taking as prescribed.  Her last dose was this morning.  Vital signs hemodynamically stable on presentation.  Labs significant for: WBC 1.7, Hgb 7.1, PLT 202, K 5.3, BUN/CR 26/2.16 (around baseline of 2.1.  Chest x-ray showed enlarged heart.  Patient was given a unit of PRBCs.    Renal function stable today.  Low concern for rejection in the setting of stable kidney function.  Okay to discharge patient if medically cleared and no other concerns for hospitalization.  Will reach out to transplant coordinator to see if patient can be seen sooner than December appointment.    Plan discussed with on-call attending as well as primary team.    Laurette Schimke. Mort Sawyers, D.O.  PGY-IV  Nephrology and Hypertension

## 2022-12-03 NOTE — Unmapped (Signed)
[facility]  Emergency Department Provider Note     ED Clinical Impression     Final diagnoses:   Low hemoglobin (Primary)   Nonintractable headache, unspecified chronicity pattern, unspecified headache type      Impression, Medical Decision Making, ED Course     Impression: 37 y.o. 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 who presents with concern for low hemoglobin, headache, chest pain as described below.     Upon evaluation in ED: Patient laying in hospital bed.  Heart rate 81.  128/77.  She is afebrile.  Satting on room air.  She has sweatshirt sleeve laying over her eyes.  Neurologically intact.  Cardiopulmonary exam is unremarkable.  Abdominal exam is unremarkable.  Chest pain is reproducible with palpation.    DDx/MDM: Symptomatic anemia, ACS, arrhythmia, pneumonia, migraine, tension headache, AKI, among other etiologies.  Her creatinine appears to be at baseline.  She is immunosuppressed, WBC 1.7.  This appears to be her baseline.  It appears that her hemoglobin is typically 8.5-9, now decreased to 7.1.  Further workup below.  Patient has history of headaches, improved after dose of Tylenol.  Chest pain also resolved after dose of Tylenol.    Diagnostic workup as below.     Orders Placed This Encounter   Procedures    XR Chest 2 views    CBC w/ Differential    Comprehensive metabolic panel    Ferritin    Iron and TIBC    APTT    Protime-INR    hsTroponin I (single, no delta)    Tacrolimus Level, Trough    Urinalysis with Microscopy with Culture Reflex    Verify Informed Consent    Misc nursing order (specify)    Vital signs    Vital signs    Notify Provider    Assess IV access    Assess for Transfusion Reaction Management    Stop Transfusion Immediately    Type and Screen    ABO/RH    Prepare RBC    Prepare RBC       ED Course as of 12/02/22 2244   Mon Dec 02, 2022   1610 Patient states that she normally gets oral Benadryl prior to blood transfusions.  This was provided to her.  No documented history of anaphylaxis from transfusions.   2029 XR Chest 2 views  IMPRESSION:     Enlarged heart.     2101 Paged nephrology for patient's creatinine.  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  Her creatinines prior to the rejection were within normal limits.  However, she has not see her nephrologist until December, and her creatinine levels have not yet normalized.  Question on this this will be her new baseline versus if she will need further rejection workup.   2119 Nephrology wants to see results of urinalysis, they will move her follow up sooner regardless of results   2200 Patient's blood transfusion has been completed.  She is feeling much better after getting Tylenol for her headache.  I spoke to nephrology, they no longer need a urinalysis.  They are going to move up her follow-up appointment.  They stated that her creatinine has been stable for quite some time, no acute concerns from that perspective.  2243 Patient feels better.  She will be discharged.  Strict return precautions provided.       MDM Elements  Discussion of Management with other Physicians, QHP or Appropriate Source: Consultant - did consult nephrology, they provided a note  I have reviewed recent and relevant previous record, including: Prior inpatient and outpatient notes as well as nephrology  Prescription drugs considered but not prescribed: Antibiotics - no obvious bacterial source of infection  ____________________________________________    The case was discussed with the attending physician, who is in agreement with the above assessment and plan.      History     Chief Complaint  Chief Complaint   Patient presents with    Anemia HPI   Elaine Koch is a 37 y.o. female with past medical history as below who presents with headache, chest pain for approximately 2 weeks.  Has had some generalized bodyaches.  Has photophobia.  Bilateral frontal.  No neck pain.  Has had headaches in the past which are similar to the current headache.  She recently had a D&C completed by OB/GYN on 11/19/2022.  She had presented to [facility] for regular lab checks due to her kidney transplant on Friday, was called today and told that her hemoglobin was 6.6 was told to present to the ER.  She denies any shortness of breath, abdominal pain, nausea, vomiting, diarrhea.  No fevers or chills.  She has been taking all her medications as prescribed.  She did state that her bleeding is significantly improved from her D&C.  She does have a history of chronic anemia, gets injections of aranesp.    Patient has a past medical history of left kidney and pancreas transplant secondary to uncontrolled diabetes, on immunosuppressive medications.      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       Past Surgical History:   Procedure Laterality Date    AV FISTULA PLACEMENT  2018    CESAREAN SECTION      PR COLONOSCOPY FLX DX W/COLLJ SPEC WHEN PFRMD N/A 02/14/2020    Procedure: COLONOSCOPY, FLEXIBLE, PROXIMAL TO SPLENIC FLEXURE; DIAGNOSTIC, W/WO COLLECTION SPECIMEN BY BRUSH OR WASH;  Surgeon: Carmon Ginsberg, MD;  Location: [facility];  Service: Gastroenterology    PR COLONOSCOPY FLX DX W/COLLJ SPEC WHEN PFRMD N/A 06/19/2020    Procedure: COLONOSCOPY, FLEXIBLE, PROXIMAL TO SPLENIC FLEXURE; DIAGNOSTIC, W/WO COLLECTION SPECIMEN BY BRUSH OR WASH;  Surgeon: Carmon Ginsberg, MD;  Location: [facility];  Service: Gastroenterology    PR FECAL MICROBIOTA PREP INSTIL N/A 02/14/2020    Procedure: PREP W INSTILLATION FECAL MICROBIOTA, ANY METHOD;  Surgeon: Carmon Ginsberg, MD;  [facility];  Service: Gastroenterology    PR INDUCED ABORTN BY DIL/EVAC N/A 03/20/2021    Procedure: DILATION AND EVACUATION - INDUCED;  Surgeon: Gaynelle Cage, MD;  Location: [facility] OR [facility];  Service: Family Planning    PR LAP,DIAGNOSTIC ABDOMEN N/A 11/17/2020    Procedure: DIAGNOSTIC AND OR OPERATIVE LAPAROSCOPY;  Surgeon: Estil Daft, MD;  Location: MAIN OR [facility];  Service: [facility] Primary Gynecology    PR PREPARE  FECAL MICROBIOTA FOR INSTILLATION  06/19/2020    Procedure: PREP FECAL MICROBIOTA FOR INSTILLATION, INCLUDING ASSESSMENT OF DONOR SPECIMEN;  Surgeon: Carmon Ginsberg, MD;  Location: [facility];  Service: Gastroenterology    PR SURG RX MISSED ABORTN,1ST TRI N/A 11/17/2020    Procedure: VACUUM ASPIRATION;  Surgeon: Estil Daft, MD;  Location: MAIN OR [facility];  Service: [facility] Primary Gynecology    PR SURG RX MISSED ABORTN,1ST TRI N/A 11/19/2022    Procedure: TREATMENT OF MISSED ABORTION, COMPLETED SURGICALLY; FIRST TRIMESTER;  Surgeon: Asencion Partridge, MD;  Location: [facility];  Service: [facility] Primary Gynecology    PR TRANSPLANT ALLOGRAFT PANCREAS N/A 05/03/2019    Procedure: TRANSPLANTATION OF PANCREATIC ALLOGRAFT;  Surgeon: Leona Carry, MD;  Location: [facility];  Service: Transplant    PR TRANSPLANT,PREP CADAVER RENAL GRAFT N/A 05/03/2019    Procedure: BACKBNCH STD PREP CAD DONR RENAL ALLOGFT PRIOR TO TRNSPLNT, INCL DISSEC/REM PERINEPH FAT, DIAPH/RTPER ATTAC;  Surgeon: Leona Carry, MD;  Location: MAIN OR [facility];  Service: Transplant    PR TRANSPLANT,PREP DONOR PANCREAS N/A 05/03/2019    Procedure: BACKBENCH STANDARD PREPARATION OF CADAVER DONOR PANCREAS ALLOGRAFT PRIOR TO TRANSPLANTATION;  Surgeon: Leona Carry, MD;  Location: MAIN OR [facility];  Service: Transplant    PR TRANSPLANTATION OF KIDNEY N/A 05/03/2019    Procedure: RENAL ALLOTRANSPLANTATION, IMPLANTATION OF GRAFT; WITHOUT RECIPIENT NEPHRECTOMY;  Surgeon: Leona Carry, MD;  Location: MAIN OR [place];  Service: Transplant    TONSILLECTOMY           Current Facility-Administered Medications:     sodium chloride 0.9 % (NS) bag, 75 mL/hr, Intravenous, Once, Aydin Hink, Archie Balboa, MD    Current Outpatient Medications:     acetaminophen (TYLENOL) 325 MG tablet, Take 2 tablets (650 mg total) by mouth every four (4) hours as needed for pain., Disp: 100 tablet, Rfl: 2    azathioprine (IMURAN) 50 mg tablet, Take 2 tablets (100 mg total) by mouth daily, Disp: 60 tablet, Rfl: 11    escitalopram oxalate (LEXAPRO) 5 MG tablet, Take 1 tablet (5 mg total) by mouth daily., Disp: , Rfl:     sodium bicarbonate 650 mg tablet, Take 2 tablets (1,300 mg total) by mouth Three (3) times a day., Disp: 180 tablet, Rfl: 3    tacrolimus (ENVARSUS XR) 1 mg Tb24 extended release tablet, Take 2 tablets (2 mg total) by mouth daily with FOUR 4mg  tablets for total daily dose of 18mg , Disp: 60 tablet, Rfl: 0    tacrolimus (ENVARSUS XR) 4 mg Tb24 extended release tablet, Take 4 tablets (16 mg total) by mouth daily. Take with two 1 mg tablets for a total of 18 mg, Disp: 120 tablet, Rfl: 11    Allergies  Iodinated contrast media, Nickel, Privigen [immun glob g(igg)-pro-iga 0-50], Propranolol, Eye irrigating solution [ophthalmic irrigation solution], Iodine, Naltrexone, and Uni-cortrom    Family History  Family History   Problem Relation Age of Onset    Diabetes Mother     Diabetes Father     Cancer Maternal Grandmother     Diabetes Maternal Grandfather     Diabetes Paternal Grandmother        Social History  Social History     Tobacco Use    Smoking status: Never    Smokeless tobacco: Never   Vaping Use    Vaping status: Never Used   Substance Use Topics    Alcohol use: Never    Drug use:  Never        Physical Exam     VITAL SIGNS:      Vitals:    12/02/22 1934 12/02/22 1935 12/02/22 1941 12/02/22 2115   BP: 134/74  131/73 140/79   Pulse: 86  75 84   Resp: 11  11 27    Temp:    36.8 ??C (98.2 ??F)   TempSrc:    Oral   SpO2:  99% 94% 100%   Weight:           Constitutional: Alert and oriented. No acute distress.  Eyes: Conjunctivae are normal.  HEENT: Normocephalic and atraumatic. Conjunctivae clear. No congestion. Moist mucous membranes.   Cardiovascular: Rate as above, regular rhythm. Normal and symmetric distal pulses. Brisk capillary refill. Normal skin turgor.  Respiratory: Normal respiratory effort. Breath sounds are normal. There are no wheezing or crackles heard.  Gastrointestinal: Soft, non-distended, non-tender.  Genitourinary: Deferred.  Musculoskeletal: Non-tender with normal range of motion in all extremities.  Neurologic: Normal speech and language. No gross focal neurologic deficits are appreciated. Patient is moving all extremities equally, face is symmetric at rest and with speech.  Skin: Skin is warm, dry and intact. No rash noted.  Psychiatric: Mood and affect are normal. Speech and behavior are normal.     Radiology     XR Chest 2 views   Final Result      Enlarged heart.          Pertinent labs & imaging results that were available during my care of the patient were independently interpreted by me and considered in my medical decision making (see chart for details).    Portions of this record have been created using Scientist, clinical (histocompatibility and immunogenetics). Dictation errors have been sought, but may not have been identified and corrected.         Leslye Peer, MD  Resident  12/02/22 [phone]

## 2022-12-03 NOTE — Unmapped (Signed)
[facility] Refill Coordination Note    Specialty Medication(s) to be Shipped:   Transplant: Envarsus 1mg , Envarsus 4mg , and azathioprine    Other medication(s) to be shipped:  sodium bicarb     Elaine Koch, DOB: [birth year]  Phone: There are no phone numbers on file.      All above HIPAA information was verified with patient.     Was a Nurse, learning disability used for this call? No    Completed refill call assessment today to schedule patient's medication shipment from the [facility]  [phone]).  All relevant notes have been reviewed.     Specialty medication(s) and dose(s) confirmed: Regimen is correct and unchanged.   Changes to medications: [name] reports no changes at this time.  Changes to insurance: No  New side effects reported not previously addressed with a pharmacist or physician: None reported  Questions for the pharmacist: No    Confirmed patient received a Conservation officer, historic buildings and a Surveyor, mining with first shipment. The patient will receive a drug information handout for each medication shipped and additional FDA Medication Guides as required.       DISEASE/MEDICATION-SPECIFIC INFORMATION        N/A    SPECIALTY MEDICATION ADHERENCE     Medication Adherence    Patient reported X missed doses in the last month: 0  Specialty Medication: azathioprine 50 mg tablet (IMURAN)  Patient is on additional specialty medications: Yes  Additional Specialty Medications: ENVARSUS XR 1 mg Tb24 extended release tablet (tacrolimus)  Patient Reported Additional Medication X Missed Doses in the Last Month: 0  Patient is on more than two specialty medications: Yes  Specialty Medication: ENVARSUS XR 4 mg Tb24 extended release tablet (tacrolimus)  Patient Reported Additional Medication X Missed Doses in the Last Month: 0              Were doses missed due to medication being on hold? No    Envarsus 1 mg: 4 days of medicine on hand   Envarsus 4 mg: 4 days of medicine on hand   azathioprine 50 mg: 4 days of medicine on hand       REFERRAL TO PHARMACIST     Referral to the pharmacist: Not needed      [facility]     Shipping address confirmed in Epic.       Delivery Scheduled: Yes, Expected medication delivery date: 12/06/22.     Medication will be delivered via UPS to the prescription address in Epic WAM.    [name]   [facility]  Specialty Technician

## 2022-12-04 MED ORDER — TACROLIMUS XR 1 MG TABLET,EXTENDED RELEASE 24 HR
ORAL_TABLET | Freq: Every day | ORAL | 0 refills | 30 days | Status: CP
Start: 2022-12-04 — End: ?
  Filled 2022-12-05: qty 60, 30d supply, fill #0

## 2022-12-04 NOTE — Unmapped (Signed)
Tacrolimus Level, Trough  Order: [id number]   Status: Final result      Tacrolimus, Trough = 17.2 High           Results reviewed and sent to Dr. Melvern Sample to review as ordering provider.

## 2022-12-05 MED FILL — ENVARSUS XR 4 MG TABLET,EXTENDED RELEASE: ORAL | 30 days supply | Qty: 120 | Fill #2

## 2022-12-05 MED FILL — AZATHIOPRINE 50 MG TABLET: ORAL | 30 days supply | Qty: 60 | Fill #10

## 2022-12-05 MED FILL — SODIUM BICARBONATE 650 MG TABLET: ORAL | 34 days supply | Qty: 200 | Fill #1

## 2022-12-12 ENCOUNTER — Emergency Department (HOSPITAL_COMMUNITY): Payer: Medicare Other

## 2022-12-12 ENCOUNTER — Other Ambulatory Visit: Payer: Self-pay

## 2022-12-12 ENCOUNTER — Encounter (HOSPITAL_COMMUNITY): Payer: Self-pay | Admitting: Emergency Medicine

## 2022-12-12 ENCOUNTER — Encounter (HOSPITAL_COMMUNITY): Payer: Self-pay

## 2022-12-12 ENCOUNTER — Emergency Department (HOSPITAL_COMMUNITY)
Admission: EM | Admit: 2022-12-12 | Discharge: 2022-12-12 | Disposition: A | Discharge status: Home / Self Care | Payer: Medicare Other | Attending: Emergency Medicine | Admitting: Emergency Medicine

## 2022-12-12 DIAGNOSIS — Z940 Kidney transplant status: Principal | ICD-10-CM

## 2022-12-12 DIAGNOSIS — D708 Other neutropenia: Principal | ICD-10-CM

## 2022-12-12 DIAGNOSIS — R519 Headache, unspecified: Secondary | ICD-10-CM | POA: Diagnosis present

## 2022-12-12 DIAGNOSIS — Z20822 Contact with and (suspected) exposure to covid-19: Secondary | ICD-10-CM | POA: Insufficient documentation

## 2022-12-12 DIAGNOSIS — Z7982 Long term (current) use of aspirin: Secondary | ICD-10-CM | POA: Diagnosis not present

## 2022-12-12 DIAGNOSIS — Z9483 Pancreas transplant status: Secondary | ICD-10-CM | POA: Diagnosis not present

## 2022-12-12 DIAGNOSIS — Z794 Long term (current) use of insulin: Secondary | ICD-10-CM | POA: Insufficient documentation

## 2022-12-12 DIAGNOSIS — I10 Essential (primary) hypertension: Secondary | ICD-10-CM | POA: Insufficient documentation

## 2022-12-12 DIAGNOSIS — M791 Myalgia, unspecified site: Secondary | ICD-10-CM | POA: Insufficient documentation

## 2022-12-12 DIAGNOSIS — R7989 Other specified abnormal findings of blood chemistry: Secondary | ICD-10-CM

## 2022-12-12 DIAGNOSIS — E119 Type 2 diabetes mellitus without complications: Secondary | ICD-10-CM | POA: Insufficient documentation

## 2022-12-12 DIAGNOSIS — R891 Abnormal level of hormones in specimens from other organs, systems and tissues: Secondary | ICD-10-CM | POA: Diagnosis not present

## 2022-12-12 DIAGNOSIS — Z79899 Other long term (current) drug therapy: Secondary | ICD-10-CM | POA: Insufficient documentation

## 2022-12-12 LAB — CBC WITH DIFFERENTIAL/PLATELET
Abs Immature Granulocytes: 0.03 10*3/uL (ref 0.00–0.07)
Basophils Absolute: 0 10*3/uL (ref 0.0–0.1)
Basophils Relative: 1 %
Eosinophils Absolute: 0.3 10*3/uL (ref 0.0–0.5)
Eosinophils Relative: 14 %
HCT: 23.1 % — ABNORMAL LOW (ref 36.0–46.0)
Hemoglobin: 7.9 g/dL — ABNORMAL LOW (ref 12.0–15.0)
Immature Granulocytes: 2 %
Lymphocytes Relative: 20 %
Lymphs Abs: 0.4 10*3/uL — ABNORMAL LOW (ref 0.7–4.0)
MCH: 35.6 pg — ABNORMAL HIGH (ref 26.0–34.0)
MCHC: 34.2 g/dL (ref 30.0–36.0)
MCV: 104.1 fL — ABNORMAL HIGH (ref 80.0–100.0)
Monocytes Absolute: 0.2 10*3/uL (ref 0.1–1.0)
Monocytes Relative: 10 %
Neutro Abs: 1 10*3/uL — ABNORMAL LOW (ref 1.7–7.7)
Neutrophils Relative %: 53 %
Platelets: 193 10*3/uL (ref 150–400)
RBC: 2.22 MIL/uL — ABNORMAL LOW (ref 3.87–5.11)
RDW: 17.2 % — ABNORMAL HIGH (ref 11.5–15.5)
WBC: 1.8 10*3/uL — ABNORMAL LOW (ref 4.0–10.5)
nRBC: 0 % (ref 0.0–0.2)

## 2022-12-12 LAB — URINALYSIS, ROUTINE W REFLEX MICROSCOPIC
Bilirubin Urine: NEGATIVE
Glucose, UA: NEGATIVE mg/dL
Hgb urine dipstick: NEGATIVE
Ketones, ur: NEGATIVE mg/dL
Nitrite: NEGATIVE
Protein, ur: NEGATIVE mg/dL
Specific Gravity, Urine: 1.011 (ref 1.005–1.030)
pH: 5 (ref 5.0–8.0)

## 2022-12-12 LAB — HCG, QUANTITATIVE, PREGNANCY: hCG, Beta Chain, Quant, S: 482 m[IU]/mL — ABNORMAL HIGH (ref <5)

## 2022-12-12 LAB — BASIC METABOLIC PANEL
Anion gap: 5 (ref 5–15)
BUN: 26 mg/dL — ABNORMAL HIGH (ref 6–20)
CO2: 22 mmol/L (ref 22–32)
Calcium: 8.9 mg/dL (ref 8.9–10.3)
Chloride: 112 mmol/L — ABNORMAL HIGH (ref 98–111)
Creatinine, Ser: 2.02 mg/dL — ABNORMAL HIGH (ref 0.44–1.00)
GFR, Estimated: 32 mL/min — ABNORMAL LOW (ref >60)
Glucose, Bld: 98 mg/dL (ref 70–99)
Potassium: 4.2 mmol/L (ref 3.5–5.1)
Sodium: 139 mmol/L (ref 135–145)

## 2022-12-12 LAB — RESP PANEL BY RT-PCR (RSV, FLU A&B, COVID)  RVPGX2
Influenza A by PCR: NEGATIVE
Influenza B by PCR: NEGATIVE
Resp Syncytial Virus by PCR: NEGATIVE
SARS Coronavirus 2 by RT PCR: NEGATIVE

## 2022-12-12 LAB — POC URINE PREG, ED: Preg Test, Ur: POSITIVE — AB

## 2022-12-12 MED ORDER — SODIUM CHLORIDE 0.9 % IV BOLUS
500.0000 mL | Freq: Once | INTRAVENOUS | Status: AC
  Administered 2022-12-12: 500 mL via INTRAVENOUS

## 2022-12-12 MED ORDER — DIPHENHYDRAMINE HCL 50 MG/ML IJ SOLN
25.0000 mg | Freq: Once | INTRAMUSCULAR | Status: AC
  Administered 2022-12-12: 25 mg via INTRAVENOUS
  Filled 2022-12-12: qty 1

## 2022-12-12 MED ORDER — METOCLOPRAMIDE HCL 5 MG/ML IJ SOLN
10.0000 mg | Freq: Once | INTRAMUSCULAR | Status: AC
  Administered 2022-12-12: 10 mg via INTRAVENOUS
  Filled 2022-12-12: qty 2

## 2022-12-12 MED ORDER — ACETAMINOPHEN 500 MG PO TABS
1000.0000 mg | ORAL_TABLET | Freq: Once | ORAL | Status: AC
  Administered 2022-12-12: 1000 mg via ORAL
  Filled 2022-12-12: qty 2

## 2022-12-12 NOTE — ED Provider Notes (Signed)
EMERGENCY DEPARTMENT AT [facility] Provider Note   CSN: [id number] Arrival date & time: 12/12/22  0557     History  Chief Complaint  Patient presents with   Generalized Body Aches    Christine Reed is a 37 y.o. female.  CARMYN HAMM is a 37 y.o. female with a history of renal and pancreatic transplant, hypertension, diabetes, migraines, GERD, who presents to the ED for evaluation of headache and generalized bodyaches.  She reports she started having bodyaches about 2 days ago, no fevers.  She reports today she woke up with a headache that feels like her usual migraine as well as some nausea.  She denies visual changes but reports that she feels more sensitive to light.  She reports she has been taking all of her medications routinely.  Her son is recently been sick with sinus infection but no other known sick contacts, she denies cough, runny nose or sore throat.  Was recently seen at [facility] for low hemoglobin and received 1 unit blood transfusion.  At that time she had a creatinine of 2.16, transplant team was contacted and they report that this has been stable for some time.  No urinary symptoms.  Had a miscarriage and a D&C on 10/8 has had some scant vaginal bleeding since then but this is overall been improving, denies any pelvic or abdominal pain.  The history is provided by the patient and medical records.       Home Medications Prior to Admission medications   Medication Sig Start Date End Date Taking? Authorizing Provider  amLODipine (NORVASC) 10 MG tablet Take 1 tablet (10 mg total) by mouth daily. 01/07/19   Glade Lloyd, MD  aspirin EC 81 MG tablet Take 81 mg by mouth daily. Swallow whole.    [provider]  azaTHIOprine (IMURAN) 50 MG tablet Take 50 mg by mouth in the morning and at bedtime.    [provider]  Biotin 1000 MCG CHEW Chew by mouth.    [provider]  escitalopram (LEXAPRO) 5 MG tablet Take 1 tablet  (5 mg total) by mouth daily. 08/29/22   Bobbye Morton, MD  glucagon (GLUCAGON EMERGENCY) 1 MG injection Inject 1 mg into the skin once as needed for up to 1 dose. Inject 1mg  into skin once as needed for low blood sugar. 06/03/17   Reather Littler, MD  insulin aspart (NOVOLOG) 100 UNIT/ML injection Inject 0-20 Units into the skin 3 (three) times daily before meals. Sliding scale 09/10/18   Reather Littler, MD  Insulin Detemir (LEVEMIR FLEXTOUCH) 100 UNIT/ML Pen INJECT 10 UNITS INTO THE SKIN IN THE MORNING AND INJECT 11 UNITS INTO THE SKIN IN THE EVENING. Patient taking differently: Inject 10-11 Units into the skin See admin instructions. Inject 10 units in the morning and 11 units in the evening. 09/10/18   Reather Littler, MD  loperamide (IMODIUM) 2 MG capsule Take 1 capsule (2 mg total) by mouth 4 (four) times daily as needed for diarrhea or loose stools. 05/03/22   Cristopher Peru, PA-C  losartan (COZAAR) 50 MG tablet Take 50 mg by mouth at bedtime. 12/11/18   [provider]  metoprolol succinate (TOPROL-XL) 50 MG 24 hr tablet Take 50 mg by mouth daily. 07/22/18   [provider]  metroNIDAZOLE (FLAGYL) 500 MG tablet Take 500 mg by mouth 2 (two) times daily. 02/16/19   [provider]  ondansetron (ZOFRAN) 4 MG tablet Take 1 tablet (4  mg total) by mouth every 6 (six) hours. 04/06/22   Rexford Maus, DO  promethazine-dextromethorphan (PROMETHAZINE-DM) 6.25-15 MG/5ML syrup Take 5 mLs by mouth 4 (four) times daily as needed for cough. 02/23/19   Pisciotta, Joni Reining, PA-C  sevelamer (RENAGEL) 800 MG tablet Take 2,400 mg by mouth See admin instructions. Take 2400mg  three times daily with each meal and snacks 04/10/17   [provider]  sodium bicarbonate 650 MG tablet Take 1,300 mg by mouth 3 (three) times daily.    [provider]  Tacrolimus ER (ENVARSUS XR) 4 MG TB24 Take 16 mg by mouth.    [provider]  buPROPion (WELLBUTRIN XL) 150 MG 24 hr tablet Take 150 mg  daily by mouth.  11/10/18  [provider]      Allergies    Ivp dye [iodinated contrast media], Propranolol hcl er, Iodine, and Nickel    Review of Systems   Review of Systems  Constitutional:  Negative for chills and fever.  HENT: Negative.    Eyes:  Positive for photophobia. Negative for visual disturbance.  Respiratory:  Negative for cough and shortness of breath.   Cardiovascular:  Negative for chest pain.  Gastrointestinal:  Positive for nausea and vomiting. Negative for abdominal pain.  Genitourinary:  Negative for dysuria and frequency.  Neurological:  Positive for headaches. Negative for dizziness, syncope, speech difficulty and weakness.  All other systems reviewed and are negative.   Physical Exam Updated Vital Signs BP 127/77 (BP Location: Left Arm)   Pulse 83   Temp 97.9 F (36.6 C) (Oral)   Resp 17   SpO2 100%  Physical Exam Vitals and nursing note reviewed.  Constitutional:      General: She is not in acute distress.    Appearance: Normal appearance. She is well-developed. She is not ill-appearing or diaphoretic.  HENT:     Head: Normocephalic and atraumatic.     Nose: Nose normal. No congestion or rhinorrhea.     Mouth/Throat:     Mouth: Mucous membranes are moist.     Pharynx: Oropharynx is clear. No oropharyngeal exudate or posterior oropharyngeal erythema.  Eyes:     General:        Right eye: No discharge.        Left eye: No discharge.  Cardiovascular:     Rate and Rhythm: Normal rate and regular rhythm.     Heart sounds: Normal heart sounds.  Pulmonary:     Effort: Pulmonary effort is normal. No respiratory distress.     Breath sounds: Normal breath sounds. No wheezing, rhonchi or rales.  Abdominal:     General: Bowel sounds are normal.     Palpations: Abdomen is soft. There is no mass.     Tenderness: There is no abdominal tenderness.  Musculoskeletal:        General: No swelling, tenderness or deformity.     Cervical back: Neck  supple.     Right lower leg: No edema.     Left lower leg: No edema.  Skin:    General: Skin is warm and dry.  Neurological:     Mental Status: She is alert and oriented to person, place, and time.     Coordination: Coordination normal.     Comments: Speech is clear, able to follow commands CN III-XII intact Normal strength in upper and lower extremities bilaterally including dorsiflexion and plantar flexion, strong and equal grip strength Sensation normal to light and sharp touch Moves extremities without  ataxia, coordination intact  Psychiatric:        Mood and Affect: Mood normal.        Behavior: Behavior normal.     ED Results / Procedures / Treatments   Labs (all labs ordered are listed, but only abnormal results are displayed) Labs Reviewed  BASIC METABOLIC PANEL - Abnormal; Notable for the following components:      Result Value   Chloride 112 (*)    BUN 26 (*)    Creatinine, Ser 2.02 (*)    GFR, Estimated 32 (*)    All other components within normal limits  CBC WITH DIFFERENTIAL/PLATELET - Abnormal; Notable for the following components:   WBC 1.8 (*)    RBC 2.22 (*)    Hemoglobin 7.9 (*)    HCT 23.1 (*)    MCV 104.1 (*)    MCH 35.6 (*)    RDW 17.2 (*)    Neutro Abs 1.0 (*)    Lymphs Abs 0.4 (*)    All other components within normal limits  URINALYSIS, ROUTINE W REFLEX MICROSCOPIC - Abnormal; Notable for the following components:   Leukocytes,Ua TRACE (*)    Bacteria, UA RARE (*)    All other components within normal limits  HCG, QUANTITATIVE, PREGNANCY - Abnormal; Notable for the following components:   hCG, Beta Chain, Quant, S 482 (*)    All other components within normal limits  POC URINE PREG, ED - Abnormal; Notable for the following components:   Preg Test, Ur POSITIVE (*)    All other components within normal limits  RESP PANEL BY RT-PCR (RSV, FLU A&B, COVID)  [id number]    EKG None  Radiology DG Chest 2 View  Result Date: 12/12/2022 CLINICAL  DATA:  bodyaches, renal transplant EXAM: CHEST - 2 VIEW COMPARISON:  04/06/2022. FINDINGS: Bilateral lung fields are clear. Bilateral costophrenic angles are clear. Normal cardio-mediastinal silhouette. No acute osseous abnormalities. The soft tissues are within normal limits. IMPRESSION: *No active cardiopulmonary disease. Electronically Signed   By: Jules Schick M.D.   On: 12/12/2022 08:29    Procedures Procedures    Medications Ordered in ED Medications  sodium chloride 0.9 % bolus 500 mL (500 mLs Intravenous New Bag/Given 12/12/22 0755)  diphenhydrAMINE (BENADRYL) injection 25 mg (25 mg Intravenous Given 12/12/22 0754)  metoCLOPramide (REGLAN) injection 10 mg (10 mg Intravenous Given 12/12/22 0755)    ED Course/ Medical Decision Making/ A&P                                 Medical Decision Making Amount and/or Complexity of Data Reviewed Labs: ordered. Radiology: ordered.  Risk OTC drugs. Prescription drug management.   37 y.o. female presents to the ED with complaints of headache and bodyaches, this involves an extensive number of treatment options, and is a complaint that carries with it a high risk of complications and morbidity.  The differential diagnosis includes migraine, viral syndrome, influenza, COVID, electrolyte derangement, worsening renal function, UTI, pneumonia, dehydration (Ddx)  On arrival pt is nontoxic, vitals WNL.   Additional history obtained from chart review. Previous records obtained and reviewed via Care Everywhere from patient's recent ED visits and care at [facility]  I ordered medication including Benadryl and Reglan as well as 500 cc fluid bolus for migraine headache  Lab Tests:  I Ordered, reviewed, and interpreted labs, which included: No leukocytosis, after recent blood transfusion hemoglobin improved at 7.9,  no significant electrolyte derangements and renal function is stable and slightly improved from recent ED visit at 2.02.  COVID and  flu negative.  UA without signs of infection.  Patient's urine pregnancy test was positive, rotative hCG levels at 482,   Imaging Studies ordered:  I ordered imaging studies which included chest x-ray, I independently visualized and interpreted imaging which showed no pneumonia, edema or other active pulmonary disease  ED Course:   On reevaluation patient is feeling much better after IV headache cocktail, her laboratory evaluation is overall reassuring today.  Suspect possible viral etiology, especially given recent sick contact from patient's son.  Renal function is stable which is reassuring.  hCG elevated, suspect this is still downtrending from her recent miscarriage.  She does not have any abdominal pain or tenderness and has had some scant bleeding but this has continued to decrease since D&C.  Patient has upcoming GYN follow-up.  Stressed the importance of close follow-up and return precautions discussed.  At this time there does not appear to be any evidence of an acute emergency medical condition requiring further emergent evaluation and the patient appears stable for discharge with appropriate outpatient follow up. Diagnosis and return precautions discussed with patient who verbalizes understanding and is agreeable to discharge.     Portions of this note were generated with Scientist, clinical (histocompatibility and immunogenetics). Dictation errors may occur despite best attempts at proofreading.         Final Clinical Impression(s) / ED Diagnoses Final diagnoses:  Myalgia  Acute nonintractable headache, unspecified headache type  Elevated serum hCG    Rx / DC Orders ED Discharge Orders     None         Legrand Rams 12/13/22 2240    Gwyneth Sprout, MD 12/17/22 2332

## 2022-12-12 NOTE — ED Triage Notes (Signed)
  Patient comes in with lower back pain and bilateral leg pain that has been going on for 2 days.  Patient was recently admitted for low hemoglobin and required transfusion.  Hx kidney transplant in 2022.  No fevers at home.  Son has sinus infection but no URI symptoms for patient.  Pain 8/10, throbbing.

## 2022-12-12 NOTE — Discharge Instructions (Addendum)
Your evaluation today was overall reassuring, COVID, flu and strep test are negative, your kidney function is slightly improved compared to labs from last week and your hemoglobin has improved since blood transfusion.  Your hCG levels are still mildly elevated after recent miscarriage please discuss this with your OB/GYN at your upcoming follow-up appointment.  Today your hCG levels were 482  Symptoms could be related to your migraine or related to a viral illness causing body aches.  Monitor your symptoms closely if you develop fevers, chest pain, shortness of breath, vomiting or other new or worsening symptoms return for reevaluation.

## 2022-12-13 ENCOUNTER — Ambulatory Visit: Admit: 2022-12-13 | Discharge: 2022-12-13 | Payer: MEDICARE

## 2022-12-13 ENCOUNTER — Encounter: Admit: 2022-12-13 | Discharge: 2022-12-13 | Payer: MEDICARE | Attending: Nephrology | Primary: Nephrology

## 2022-12-13 ENCOUNTER — Ambulatory Visit: Admit: 2022-12-13 | Discharge: 2022-12-13 | Payer: MEDICARE | Attending: Nephrology | Primary: Nephrology

## 2022-12-13 DIAGNOSIS — E119 Type 2 diabetes mellitus without complications: Principal | ICD-10-CM

## 2022-12-13 DIAGNOSIS — Z940 Kidney transplant status: Principal | ICD-10-CM

## 2022-12-13 DIAGNOSIS — Z79899 Other long term (current) drug therapy: Principal | ICD-10-CM

## 2022-12-13 DIAGNOSIS — N1832 Anemia in stage 3b chronic kidney disease (CMS-HCC): Principal | ICD-10-CM

## 2022-12-13 DIAGNOSIS — N184 Chronic kidney disease, stage 4 (severe): Principal | ICD-10-CM

## 2022-12-13 DIAGNOSIS — R197 Diarrhea, unspecified: Principal | ICD-10-CM

## 2022-12-13 DIAGNOSIS — D84821 Immunocompromised state due to drug therapy (CMS-HCC): Principal | ICD-10-CM

## 2022-12-13 DIAGNOSIS — E872 Metabolic acidosis: Principal | ICD-10-CM

## 2022-12-13 DIAGNOSIS — F32A Depression, unspecified depression type: Principal | ICD-10-CM

## 2022-12-13 DIAGNOSIS — D631 Anemia in chronic kidney disease: Principal | ICD-10-CM

## 2022-12-13 DIAGNOSIS — T8611 Kidney transplant rejection: Principal | ICD-10-CM

## 2022-12-13 DIAGNOSIS — D708 Other neutropenia: Principal | ICD-10-CM

## 2022-12-13 DIAGNOSIS — Z1159 Encounter for screening for other viral diseases: Principal | ICD-10-CM

## 2022-12-13 LAB — LIPID PANEL
CHOLESTEROL/HDL RATIO SCREEN: 1.7 (ref 1.0–4.5)
CHOLESTEROL: 87 mg/dL (ref <=200)
HDL CHOLESTEROL: 50 mg/dL (ref 40–60)
LDL CHOLESTEROL CALCULATED: 30 mg/dL — ABNORMAL LOW (ref 40–99)
NON-HDL CHOLESTEROL: 37 mg/dL — ABNORMAL LOW (ref 70–130)
TRIGLYCERIDES: 36 mg/dL (ref 0–150)
VLDL CHOLESTEROL CAL: 7.2 mg/dL — ABNORMAL LOW (ref 8–32)

## 2022-12-13 LAB — URINALYSIS WITH MICROSCOPY
BACTERIA: NONE SEEN /HPF
BILIRUBIN UA: NEGATIVE
GLUCOSE UA: NEGATIVE
KETONES UA: NEGATIVE
LEUKOCYTE ESTERASE UA: NEGATIVE
NITRITE UA: NEGATIVE
PH UA: 5.5 (ref 5.0–9.0)
RBC UA: 2 /HPF (ref <=4)
SPECIFIC GRAVITY UA: 1.014 (ref 1.003–1.030)
SQUAMOUS EPITHELIAL: 6 /HPF — ABNORMAL HIGH (ref 0–5)
UROBILINOGEN UA: <2
WBC UA: 2 /HPF (ref 0–5)

## 2022-12-13 LAB — CBC W/ AUTO DIFF
BASOPHILS ABSOLUTE COUNT: 0 10*9/L (ref 0.0–0.1)
BASOPHILS RELATIVE PERCENT: 1.3 %
EOSINOPHILS ABSOLUTE COUNT: 0.2 10*9/L (ref 0.0–0.5)
EOSINOPHILS RELATIVE PERCENT: 15.3 %
HEMATOCRIT: 24.5 % — ABNORMAL LOW (ref 34.0–44.0)
HEMOGLOBIN: 8.5 g/dL — ABNORMAL LOW (ref 11.3–14.9)
LYMPHOCYTES ABSOLUTE COUNT: 0.3 10*9/L — ABNORMAL LOW (ref 1.1–3.6)
LYMPHOCYTES RELATIVE PERCENT: 20.6 %
MEAN CORPUSCULAR HEMOGLOBIN CONC: 34.9 g/dL (ref 32.0–36.0)
MEAN CORPUSCULAR HEMOGLOBIN: 36.3 pg — ABNORMAL HIGH (ref 25.9–32.4)
MEAN CORPUSCULAR VOLUME: 104 fL — ABNORMAL HIGH (ref 77.6–95.7)
MEAN PLATELET VOLUME: 8.3 fL (ref 6.8–10.7)
MONOCYTES ABSOLUTE COUNT: 0.1 10*9/L — ABNORMAL LOW (ref 0.3–0.8)
MONOCYTES RELATIVE PERCENT: 9.3 %
NEUTROPHILS ABSOLUTE COUNT: 0.7 10*9/L — ABNORMAL LOW (ref 1.8–7.8)
NEUTROPHILS RELATIVE PERCENT: 53.5 %
PLATELET COUNT: 187 10*9/L (ref 150–450)
RED BLOOD CELL COUNT: 2.36 10*12/L — ABNORMAL LOW (ref 3.95–5.13)
RED CELL DISTRIBUTION WIDTH: 19.8 % — ABNORMAL HIGH (ref 12.2–15.2)
WBC ADJUSTED: 1.3 10*9/L — CL (ref 3.6–11.2)

## 2022-12-13 LAB — AMYLASE: AMYLASE: 118 U/L (ref 30–118)

## 2022-12-13 LAB — IRON & TIBC
IRON SATURATION: 47 % (ref 20–55)
IRON: 124 ug/dL
TOTAL IRON BINDING CAPACITY: 265 ug/dL (ref 250–425)

## 2022-12-13 LAB — PARATHYROID HORMONE (PTH): PARATHYROID HORMONE INTACT: 211.9 pg/mL — ABNORMAL HIGH (ref 18.4–80.1)

## 2022-12-13 LAB — BASIC METABOLIC PANEL
ANION GAP: 8 mmol/L (ref 5–14)
BLOOD UREA NITROGEN: 21 mg/dL (ref 9–23)
BUN / CREAT RATIO: 10
CALCIUM: 9.8 mg/dL (ref 8.7–10.4)
CHLORIDE: 113 mmol/L — ABNORMAL HIGH (ref 98–107)
CO2: 21.8 mmol/L (ref 20.0–31.0)
CREATININE: 2.11 mg/dL — ABNORMAL HIGH
EGFR CKD-EPI (2021) FEMALE: 31 mL/min/{1.73_m2} — ABNORMAL LOW (ref >=60)
GLUCOSE RANDOM: 91 mg/dL (ref 70–99)
POTASSIUM: 4.5 mmol/L (ref 3.4–4.8)
SODIUM: 143 mmol/L (ref 135–145)

## 2022-12-13 LAB — PROTEIN / CREATININE RATIO, URINE
CREATININE, URINE: 153 mg/dL
PROTEIN URINE: 42.5 mg/dL
PROTEIN/CREAT RATIO, URINE: 0.278

## 2022-12-13 LAB — MAGNESIUM: MAGNESIUM: 1.8 mg/dL (ref 1.6–2.6)

## 2022-12-13 LAB — HEPATIC FUNCTION PANEL
ALBUMIN: 4.2 g/dL (ref 3.4–5.0)
ALKALINE PHOSPHATASE: 43 U/L — ABNORMAL LOW (ref 46–116)
ALT (SGPT): 45 U/L (ref 10–49)
AST (SGOT): 39 U/L — ABNORMAL HIGH (ref <=34)
BILIRUBIN DIRECT: 0.6 mg/dL — ABNORMAL HIGH (ref 0.00–0.30)
BILIRUBIN TOTAL: 1.3 mg/dL — ABNORMAL HIGH (ref 0.3–1.2)
PROTEIN TOTAL: 7.2 g/dL (ref 5.7–8.2)

## 2022-12-13 LAB — LIPASE: LIPASE: 23 U/L (ref 12–53)

## 2022-12-13 LAB — FERRITIN: FERRITIN: 457.1 ng/mL — ABNORMAL HIGH

## 2022-12-13 LAB — HEMOGLOBIN A1C
ESTIMATED AVERAGE GLUCOSE: 105 mg/dL
HEMOGLOBIN A1C: 5.3 % (ref 4.8–5.6)

## 2022-12-13 LAB — PHOSPHORUS: PHOSPHORUS: 3.8 mg/dL (ref 2.4–5.1)

## 2022-12-13 MED ORDER — ESCITALOPRAM 5 MG TABLET
ORAL_TABLET | Freq: Every day | ORAL | 3 refills | 90 days | Status: CP
Start: 2022-12-13 — End: 2023-12-08

## 2022-12-13 MED ORDER — BUTALBITAL-ACETAMINOPHEN-CAFFEINE 50 MG-325 MG-40 MG TABLET
ORAL_TABLET | ORAL | 2 refills | 5 days | Status: CP | PRN
Start: 2022-12-13 — End: ?

## 2022-12-13 NOTE — Unmapped (Signed)
Met w/ patient in [facility] today. Reviewed meds/symptoms. Any new medications?                 Fever/cold/flu symptoms denies  BP: 135/74 today/ Home BP reported not checking  BG: not checking  Headache/Dizziness/Lightheaded: reports headaches - went to ED yesterday  Hand tremors: denies  Numbness/tingling: denies  Fevers/chills/sweats: denies  CP/SOB/palpatations: denies  Nausea/vomiting/heartburn: reports intermittent N/V - needs to follow up with GI  Diarrhea/constipation: reports intermittent diarrhea - will follow up with stool studies  UTI symptoms (burn/pain/itch/frequency/urgency/odor/color/foam): denies  No visible or palpable edema     Appetite good; reports adequate hydration. 2-3 oz/bottles/fluid per day.     Pt reports being well rested and getting adequate exercise.     Continues to follow Covid/health safety precautions by taking care to mask, perform frequent hand hygeine and minimal public activity. Offered support and guidance for this process given their immune-suppressed state. We discussed reduced covid vaccine coverage for transplant patients and importance of continuing to mask and practice safe distancing. Commented that booster vaccines will likely be advised as an ongoing process.     Pain: headaches     Last envarsus taken 0900; held for this morning's labs. Current dose 18 mg daily; Imuran 100 mg daily     Other complaints or concerns: reports fatigue    Referrals needed: PCP at [facility], psych     Pt Follow up: labs today     Immunization status: UTD     Functional Score: 100     Employment/work status:  works full time

## 2022-12-13 NOTE — Unmapped (Signed)
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 As long as she is stable, would just follow for now. Her DSAs were slightly improved on screen from 08/01/22.    With regards to her pancreas transplant, her amylase had been chronically elevated, but is normal today. Other labs are reassuring.    # Immunosuppression  Lab Results   Component Value Date    TACROLIMUS 17.2 (H) 12/02/2022     Tacrolimus: Her tacrolimus level today will be a 24-hour trough. Given her pancreas transplant, her goal is 8-10. Will continue current dose and follow up level from today.  Azathioprine 100 mg daily.  Prednisone free.    # Leukopenia  White blood cell count today is doen to 1.3, with ANC 0.7. Suspect this is secondary to bone marrow suppression from azathioprine, however I am hesitant to switch her back to mycophenolic acid given that she is seeking pregnancy.     Will bring her back for a dose of Granix.    # Pregnancy loss  I am not sure what to make of the persistently positive HCG, she will follow up with OB for that. She is interested in continuing to pursue pregnancy, she is on a reasonable immunosuppressive regimen for that.    # Depression/anxiety  I let her know that I am happy to refill her anti-depressants, lexapro sent in today. Will try to get her reconnected with Dr. Neale Burly.    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 Have encouraged her to get the supplement GI recommended. Will add on the labs they recommended to those drawn today and have encouraged her to do the stool studies.    # Metabolic acidosis  Lab Results   Component Value Date    CO2 21.8 12/13/2022     Stable on sodium bicarbonate 1300 mg twice daily.    # BP management  BP: 135/74 in clinic today. She has not required antihypertensives.    # Infectious disease  CMV PCR:   Lab Results   Component Value Date    CMVLR Not Detected 08/01/2022     BK PCR:   Lab Results   Component Value Date    BBKQR Not Detected 08/01/2022     EBV PCR: neg 05/30/22     # Anemia screening  Lab Results   Component Value Date    HGB 8.5 (L) 12/13/2022     Lab Results   Component Value Date    IRON 124 12/13/2022    TIBC 265 12/13/2022    LABIRON 47 12/13/2022     She has been persistently anemic, likely from CKD and azathioprine. She     # Cardiovascular  The ASCVD Risk score (Arnett DK, et al., 2019) failed to calculate..  Lab Results   Component Value Date    CHOL 87  12/13/2022    HDL 50 12/13/2022    LDL 30 (L) 12/13/2022     Lipid panel at goal, continue  to monitor .    # Bone and mineral metabolism  Lab Results   Component Value Date    CALCIUM 9.8 12/13/2022      Lab Results   Component Value Date    PHOS 3.8 12/13/2022     Lab Results   Component Value Date    PTH 211.9 (H) 12/13/2022     Stable, no changes.    # Electrolytes  Lab Results   Component Value Date    MG 1.8 12/13/2022     Lab Results   Component Value Date    K 4.5 12/13/2022     Stable, no changes.    # Immunizations  Last Covid Booster: Completed - Date: 03/23/20  Flu Shot: Completed - Date: 12/21/21  Pneumococcal: Needed Prevnar-20  Shingrix: Needed age 53    # Cancer screening  PAP smear: completed - date: 02/06/21  Mammogram: N/A  Colonoscopy: N/A  Skin: recommend yearly dermatology evaluation  Renal US: completed - date: 04/24/22  CXR: completed - date: 12/12/22    # Childbearing age  She is not trying actively to prevent pregnancy. Is on azathioprine for that reason.    Most pre-menopausal women experience return of fertility post-transplant, and a safe pregnancy is possible with careful planning. Recommend using abstinence or a highly effective contraception to avoid pregnancy until planning pregnancy, and if planning pregnancy please notify nephrology so that we can adjust medications to avoid teratogens.    # Health maintenance  She is open to establishing with a PCP here at [facility], will make referral.    There are no Patient Instructions on file for this visit.      Return in about 10 weeks (around 02/21/2023).    ___________________________________________________________________      Kidney Transplant History:   Date of Transplant: 05/03/2019 (Kidney / Pancreas)  Type of Transplant: Deceased  KDPI: 34%  Native Kidney Disease: Type 1 Diabetes  Prior Transplants: None    Biopsies:   Zero-Hour Biopsy: Slim biopsy core without characteristic and diagnostic abnormalities.  03/02/2021: Minor morphologic changes.  08/16/2022: Limited cortex available for evaluation; Mild active antibody mediated rejection; Mild glomerulitis; Mild peritubular capillaritis; 1+ C4d peritubular capillary staining (<5%); Banff Score (2919): i0, t0, v0, g1, ptc1, C4d1, ci0, ct0, cv3, cg0, ptcml0, ti0, i-IFTA0, t-IFTA0, pvl0.    Donor Specific Antibodies:   To C17, first on 11/26/19 at 1226, peak 3268 on 10/11/21, 1376 on 08/01/22.  To DR51, first/peak on 06/07/20 at 1330, 1041 on 08/01/22.    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 Had some low pressures as an outpatient, blood pressure meds held and was on midodrine for a time. Transitioned to Envarsus given high tacrolimus requirement.  - Admitted 6/1-07/16/19 with HA/N/V/D. Had AKI with tacrolimus level of 27.9. Had IVF, found to be C.diff positive and started on vancomycin.   - On 07/20/19 noted to have wbc < 0.6, Myfortic and Valcyte held, given Granix. Up to 3.9 on 07/26/19, Myfortic restarted at 180 mg BID (was 720 mg prior to holding).  - Diarrhea returned on 07/28/19, treated again with vanc for 14  days.  - Continued to have diarrhea, referred for FMT. Had been treated with fidaxomicin and bezlotoxumab.  - Admitted 8/20-8/22/21 for Enterococcal UTI.  - Had fecal transplant 02/14/20. Called 02/17/20 feeling poorly, admitted 1/6-1/11/22 with AKI and elevated tac level. Had proteus in urine and blood, was Covid positive as well. Treated with cefipime then then cipro, also get remdesivir. Myfortic and Valcyte held on discharge for leukopenia. Myfortic restarted 03/03/20.  - She unfortunately had recurrence of her diarrhea which did not respond to further oral therapy, had repeat FMT on 06/19/20.  - At her visit 09/01/20 she was interested in pursuing pregnancy but knew that she could not be on Myfortic. Planned to have her see MFM for a consult prior to transitioning from Myfortic to azathioprine. She called 10/30/20 to report a positive pregnancy test. We stopped Myfortic and started azathioprine. She was seen in the ED 11/02/20 for vaginal bleeding, was seen by OB and felt to most likely be experiencing a miscarriage. Beta HCG was 14,771. On 11/08/20 was having ongoing bleeding and clots. She presented again to [facility] 11/17/20 with lower abdominal pain and bleeding. US showed no clear IUP and bHCG was down to 7672. Underwent D&C and subsequent diagnostic laparoscopy to rule out ectopic pregnancy, fallopian tubes were normal. Hgb dropped from 11 to 9. Creatinine remained at baseline. She was to get follow up bHCG following D&C but they were unable to reach her to get that done.  Micah Flesher to ED at [facility] 01/29/21 where she was found to have a bHCG of 190,000. US showed pregnancy at [redacted]w[redacted]d. She was able to tolerate po so was discharged home, creatinine was 1.53. She was seen by MFM 02/06/21. She did not have any labs between 01/29/21 and 02/23/21, creatinine from 1.53 to 1.73. Repeated labs on 02/28/21 and creatinine remained mildly elevated from baseline at 1.65. Since it was still early in pregnancy, we decided to biopsy her to rule out rejection before she was too progressed in her pregnancy. She underwent biopsy 03/02/21 that showed only minor morphological changes, no rejection.  -  She saw MFM and Dr. Thad Ranger 03/06/21 with plans to start epogen for low Hgb. Wanted to continue the pregnancy at that time, however called 03/12/21 asking to proceed with termination, due to concerns for affects of pregnancy on her kidney as well as having delivered previous pregnancy at 27 weeks. Underwent uterine vacuum aspiration on 03/20/21.   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 Was not trying to prevent pregnancy, was open to conceiving.  - Seen locally 11/22/21 for URI symptoms, Covid and strep negative. Not tested for RSV or flu that I can see. Recommended symptomatic treatment. Seen again 01/07/22 and 01/10/22 for ongoing cough, felt from GERD so protonix started.  - Noted to have wbc 1.9 on 01/09/22, planned for Granix at her appointment 01/11/22, however she cancelled that appointment stating that she was too sick to come to clinic. She ultimately had a dose on 01/24/22.  - Was seen in the ED 02/01/22 with 3 days of N/V. Given IVF and treated for possible UTI with cefdinir. Urine culture grew 50-100K CNS, GC and chlamydia negative, pregnancy negative.  - She did not connect for video visits 03/21/22 or 04/03/22.  - Presented to [facility] ED 04/06/22 with N/V. Treated for presumed pneumonia with amoxicillin, given Zofran. Symptoms improved but she experienced diarrhea and presented to ED 04/24/22 for concerns of C.diff. Renal  US and CT abdomen unremarkable. She did not have a BM while in the ED for ~ 12 hours so discharged with a stool kit to collect a specimen. Seen at [facility] ED 05/03/22 for ongoing diarrhea, had not submitted stool sample. There a rapid C.diff was negative, given IVF and discharged on Bentyl and Imodium.   - Called on call coordinator 05/04/22 complaining of ongoing abdominal cramping and diarrhea, somewhat frustrated that no one could explain her symptoms. Placed e-consult for GI, when they reached out for a history, they were unable to reach her.  - Her coordinator reached out 05/06/22 and patient stated diarrhea was better and she was not needing Imodium. Was offered an appointment with me, patient declined and preferred to see GI locally. Called 05/14/22 stating she was having extreme stomach cramps. Again declined appointment with me, asked to be admitted. Was advised to come to ED.  - Admitted from 4/2-05/16/22. Noted that on 05/03/22 at [facility], her stool sample was positive for enteropathogenic E.coli. Was started on IV Cipro empirically, but repeat GIPP returned negative. She did not have further diarrhea while in house. GI referral made. Was also treated with Flagyl for BV. Noted to have new eosinophilia, ddx includes eosinophilic colitis from azathioprine.  - At her visit 05/30/22 she continued to have GI symptoms. She felt her symptoms were good for a while after her second fecal transplant, but around the end of February/early March she was put on antibiotics for pneumonia at [facility] and had GI symptoms ever since. Noted a lot of abdominal bloating. Would have 8-10 loose bowel movements at night about 1-2 times a week. Had pretty extreme abdominal cramping and burning, last 1-1.5 hours, did not change with eating or having a bowel movement. She reported that prior to kidney transplant, she had some irritable bowel symptoms, diarrhea, and sensitivity to certain foods.  - Was seen in the ED locally on 07/02/2022 to try to get established with outpatient mental health services. She reported over the previous 2 to 3 weeks an increase in her depression and anxiety. She was finding it difficult to go to work and was restless, fatigued with decreased appetite and decreased motivation. She denied suicidal ideation or plans. She was discharged with plans to follow-up with outpatient services. Our transplant psychologist contacted her 07/03/2022 and assisted her with some resources on finding mental health providers in her area as well as setting up an appointment with her on 07/12/2022. At that visit she reported that she had been engaging in group therapy through [facility] health and that was really helpful for her.  She had also started Prozac. She met with our psychologist again this morning.    History of Present Illness:     Since the last visit 08/01/22:  - At her visit 08/01/22 her GI symptoms were improved. She had switched from Prozac to lexapro with improvement in her mood, but more daytime fatigue. Had stopped working to focus on her health.  - Her creatinine had been running 1.5-1.7, early in 2024 started to rise into the range of 1.8-2.1. On 08/01/22 was up to 2.4, so planned for renal biopsy. Underwent biopsy in [place] 08/08/22 complicated by bleeding, were unable to get an adequate specimen.she was admitted and the biopsy was subsequently done in vascular radiology. That showed mild active antibody mediated rejection, C4d positive, with peritubular capillaritis and glomerulitis. Plan was for Solu-Medrol 500 mg IV for 3 days, IVIG 1 g/kg for 2 days and 1 g of  rituximab. Unfortunately, she did not tolerate IVIG due to diffuse myalgias, even with premedication. She did fine with rituximab. She was discharged with Bactrim and Valcyte. Her creatinine was 2.31 at the time of discharge.  - She did meet with Artemio Aly, transplant psychologist on 08/07/22, was a no-show for her visit with him on 08/23/22.  Did connect on 08/26/22, was a no-show on 10/09/22.  - Got dose of Aranesp as an outpatient on 08/30/22.  - Had a telemedicine visit with gastroenterology on 10/23/22, reported intermittent abdominal symptoms with watery diarrhea, abdominal pain and bloating. It would occur for a couple of days and then stop completely. Happened every couple of weeks. 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 difficile recurrence, microscopic colitis or medication induced colitis, celiac or alpha gal. Recommended stool testing and blood work. Also recommended L-glutamine supplement 5 g 3 times daily and Imodium as needed.  - Called her coordinator 11/03/22 reporting positive home pregnancy test. She was also having some vaginal spotting and diarrhea. She presented to the ED for evaluation. She had an ultrasound that demonstrated intrauterine pregnancy with findings concerning for early pregnancy loss. OB was consulted by phone who did not have additional recommendations and that she should follow-up for repeat ultrasound in 10 days. She saw them on 11/18/22 where ultrasound showed nonviable first trimester pregnancy. There was no fetal heart rate and no yolk sac. They discussed treatment options, and patient expressed desire for D&C. Underwent the procedure on 11/19/22. She called the on-call coordinator on 11/24/22 noted abdominal cramping and bleeding. The coordinator paged the on-call for GYN to contact the patient. On 12/02/22 she was noted to have a hemoglobin of 6.6, down from 8.1 on 11/19/2022. We contacted the patient and she reported fatigue and a terrible headache. She was referred to the emergency department. She was no longer having vaginal bleeding at that time, hemoglobin 7.1 there. She received 1 unit of packed red blood cells and was discharged.  - She was seen at the ED at [facility] on 12/12/22 with low back pain and bilateral leg pain for 2 days. Also had a headache. She had a white blood cell count of 1.8, hemoglobin 7.9, creatinine 2.02.  COVID/flu/RSV were negative. Was noted to have a pregnancy test that was still positive, with quantitative hCG of 482.    In clinic today:  - She canceled appointments with me on 7/26 and 8/16, was a no-show on 10/25/22. Since her rejection, her creatinine has remained between 1.8 and 2.1.  - She has not had any additional heavy vaginal bleeding, has just been spotting.  - Notes ongoing headaches, this has been a chronic problem for her. She had pretty significant migraines when she was on dialysis, posttransplant when her Tac levels have been high she has also had headaches. She notes them now daily usually between 8 am lasting until 12 or 1 pm. She has to dim the lights at work. She notes they are frontal in the midline. Benadryl, Flonase and Tylenol have worked in the past, does dull the pain but it still lingers. No fever. No neck stiffness.  - Her abdominal symptoms persist, but are largely unchanged. She continues to have the symptoms for about 2 to 3 days every couple of weeks  Mostly diarrhea and bloating, up to 5 bowel movements per day. She has not started the L-glutamine as suggested by gastroenterology, nor has she done stool studies or blood work.  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 She really enjoys the job, but the commute is 90 manage each way and that has been really stressful for her.  - She is not currently on her Lexapro, she notes since stopping the mental health program that she was doing locally, she did not have anyone to refill the medication.  She did feel like it was helping.    Last dose of tacrolimus: 10 am    Concerns about nonadherence: In the past, seems to be on track now    Social: She lives in [place]. Has a son born in 59. Works as a Artist at [employer].    Review of Systems:   All other systems negative, except as documented in the HPI.    Physical Exam:    BP 135/74 (BP Site: L Arm, BP Position: Sitting, BP Cuff Size: Medium)  - Pulse 76  - Temp 36.2 ??C (97.2 ??F) (Temporal)  - Wt 70.1 kg (154 lb 9.6 oz)  - LMP  (LMP Unknown)  - BMI 25.73 kg/m??   General: Patient is a pleasant female in no apparent distress.  Eyes: Sclera anicteric.  ENT: Oropharynx without lesions.   Neck: Supple without LAD/JVD/bruits.  Lungs: Clear to auscultation bilaterally, no wheezes/rales/rhonchi.  Cardiovascular: Regular rate and rhythm without murmurs, rubs or gallops.  Abdomen: Soft, notender/nondistended. Positive bowel sounds. No tenderness over the graft.  Extremities: Without edema, joints without evidence of synovitis.  Skin: Without rash.  Neurological: Grossly nonfocal.  Psychiatric:  Somewhat flat affect .      Allergies:   Allergies   Allergen Reactions    Iodinated Contrast Media Swelling, Rash and Other (See Comments)     Burning, warmth throughout body and tingling.  Throat swelling.  Treated with benadryl and symptoms improved.  Has not had contrast since then (2013 or 2014).    Burning    Nickel Rash    Privigen [Immun Glob G(Igg)-Pro-Iga 0-50] Muscle Pain     PRIVIGEN brand: back and leg pain at usual rate of infusion during first dose, then full body pain again when rechallenged at slower rate of 30 mL/hr    Propranolol Other (See Comments) and Swelling    Eye Irrigating Solution [Ophthalmic Irrigation Solution] Other (See Comments)     Contrast dye (name?) used in eyes caused hot feeling in face, reversed with Benadryl.     Iodine      Other reaction(s): Skin Rash    Naltrexone Rash    Uni-Cortrom Rash        Current Medications:   Current Outpatient Medications   Medication Sig Dispense Refill    acetaminophen (TYLENOL) 325 MG tablet Take 2 tablets (650 mg total) by mouth every four (4) hours as needed for pain. 100 tablet 2    azathioprine (IMURAN) 50 mg tablet Take 2 tablets (100 mg total) by mouth daily 60 tablet 11    butalbital-acetaminophen-caffeine (ESGIC) 50-325-40 mg per tablet Take 1 tablet by mouth every four (4) hours as needed for pain. 30 tablet 2    escitalopram oxalate (LEXAPRO) 5 MG tablet Take 1 tablet (5 mg total) by mouth daily. 90 tablet 3    sodium bicarbonate 650 mg tablet Take 2 tablets (1,300 mg total) by mouth Three (3) times a day. 180 tablet 3    tacrolimus (ENVARSUS XR) 1 mg Tb24 extended release tablet Take 2 tablets (2 mg total) by mouth daily with FOUR 4mg   tablets for total daily dose of 18mg  60 tablet 0    tacrolimus (ENVARSUS XR) 4 mg Tb24 extended release tablet Take 4 tablets (16 mg total) by mouth daily. Take with two 1 mg tablets for a total of 18 mg 120 tablet 11     No current facility-administered medications for this visit.         Laboratory Studies:  Recent Results (from the past 170 hour(s))   Basic Metabolic Panel    Collection Time: 12/13/22 10:01 AM   Result Value Ref Range    Sodium 143 135 - 145 mmol/L    Potassium 4.5 3.4 - 4.8 mmol/L    Chloride 113 (H) 98 - 107 mmol/L    CO2 21.8 20.0 - 31.0 mmol/L    Anion Gap 8 5 - 14 mmol/L    BUN 21 9 - 23 mg/dL    Creatinine 1.30 (H) 0.55 - 1.02 mg/dL    BUN/Creatinine Ratio 10     eGFR CKD-EPI (2021) Female 31 (L) >=60 mL/min/1.82m2    Glucose 91 70 - 99 mg/dL    Calcium 9.8 8.7 - 86.5 mg/dL   Phosphorus Level    Collection Time: 12/13/22 10:01 AM   Result Value Ref Range    Phosphorus 3.8 2.4 - 5.1 mg/dL   Magnesium Level    Collection Time: 12/13/22 10:01 AM   Result Value Ref Range    Magnesium 1.8 1.6 - 2.6 mg/dL   Urinalysis with Microscopy    Collection Time: 12/13/22 10:01 AM   Result Value Ref Range    Color, UA Light Yellow     Clarity, UA Clear     Specific Gravity, UA 1.014 1.003 - 1.030    pH, UA 5.5 5.0 - 9.0    Leukocyte Esterase, UA Negative Negative    Nitrite, UA Negative Negative    Protein, UA Trace (A) Negative    Glucose, UA Negative Negative    Ketones, UA Negative Negative    Urobilinogen, UA <2.0 mg/dL <7.8 mg/dL    Bilirubin, UA Negative Negative    Blood, UA Moderate (A) Negative    RBC, UA 2 <=4 /HPF    WBC, UA 2 0 - 5 /HPF    Squam Epithel, UA 6 (H) 0 - 5 /HPF    Bacteria, UA None Seen None Seen /HPF   Protein/Creatinine Ratio, Urine    Collection Time: 12/13/22 10:01 AM   Result Value Ref Range    Creat U 153.0 Undefined mg/dL    Protein, Ur 46.9 Undefined mg/dL    Protein/Creatinine Ratio, Urine 0.278 Undefined   Amylase Level    Collection Time: 12/13/22 10:01 AM   Result Value Ref Range    Amylase 118 30 - 118 U/L   Lipase Level    Collection Time: 12/13/22 10:01 AM   Result Value Ref Range    Lipase 23 12 - 53 U/L   Hemoglobin A1c    Collection Time: 12/13/22 10:01 AM   Result Value Ref Range    Hemoglobin A1C 5.3 4.8 - 5.6 %    Estimated Average Glucose 105 mg/dL   Iron Profile    Collection Time: 12/13/22 10:01 AM   Result Value Ref Range    Iron 124 50 - 170 ug/dL    TIBC 629 528 - 413 ug/dL    Iron Saturation (%) 47 20 - 55 %   Ferritin    Collection Time: 12/13/22 10:01 AM   Result Value Ref  Range    Ferritin 457.1 (H) 7.3 - 270.7 ng/mL   Hepatic Function Panel    Collection Time: 12/13/22 10:01 AM   Result Value Ref Range    Albumin 4.2 3.4 - 5.0 g/dL    Total Protein 7.2 5.7 - 8.2 g/dL    Total Bilirubin 1.3 (H) 0.3 - 1.2 mg/dL    Bilirubin, Direct 8.41 (H) 0.00 - 0.30 mg/dL    AST 39 (H) <=32 U/L    ALT 45 10 - 49 U/L    Alkaline Phosphatase 43 (L) 46 - 116 U/L   Lipid panel    Collection Time: 12/13/22 10:01 AM   Result Value Ref Range    Triglycerides 36 0 - 150 mg/dL    Cholesterol 87 <=440 mg/dL    HDL 50 40 - 60 mg/dL    LDL Calculated 30 (L) 40 - 99 mg/dL    VLDL Cholesterol Cal 7.2 (L) 8 - 32 mg/dL    Chol/HDL Ratio 1.7 1.0 - 4.5    Non-HDL Cholesterol 37 (L) 70 - 130 mg/dL    FASTING Yes    Parathyroid Hormone (PTH)    Collection Time: 12/13/22 10:01 AM   Result Value Ref Range    PTH 211.9 (H) 18.4 - 80.1 pg/mL   CBC w/ Differential    Collection Time: 12/13/22 10:01 AM   Result Value Ref Range    WBC 1.3 (LL) 3.6 - 11.2 10*9/L    RBC 2.36 (L) 3.95 - 5.13 10*12/L    HGB 8.5 (L) 11.3 - 14.9 g/dL    HCT 10.2 (L) 72.5 - 44.0 %    MCV 104.0 (H) 77.6 - 95.7 fL    MCH 36.3 (H) 25.9 - 32.4 pg    MCHC 34.9 32.0 - 36.0 g/dL    RDW 36.6 (H) 44.0 - 15.2 %    MPV 8.3 6.8 - 10.7 fL    Platelet 187 150 - 450 10*9/L    Neutrophils % 53.5 %    Lymphocytes % 20.6 %    Monocytes % 9.3 %    Eosinophils % 15.3 %    Basophils % 1.3 %    Absolute Neutrophils 0.7 (L) 1.8 - 7.8 10*9/L    Absolute Lymphocytes 0.3 (L) 1.1 - 3.6 10*9/L    Absolute Monocytes 0.1 (L) 0.3 - 0.8 10*9/L    Absolute Eosinophils 0.2 0.0 - 0.5 10*9/L    Absolute Basophils 0.0 0.0 - 0.1 10*9/L    Anisocytosis Moderate (A) Not Present             Electronically signed by:   Drinda Butts, MD  [facility]      I personally spent 48 minutes face-to-face and non-face-to-face in the care of this patient, which includes all pre, intra and post visit time on the date of service.

## 2022-12-14 LAB — CMV DNA, QUANTITATIVE, PCR: CMV VIRAL LD: NOT DETECTED

## 2022-12-14 LAB — TACROLIMUS LEVEL, TROUGH: TACROLIMUS, TROUGH: 8.4 ng/mL (ref 5.0–15.0)

## 2022-12-14 LAB — BK VIRUS QUANTITATIVE PCR, BLOOD: BK BLOOD RESULT: NOT DETECTED

## 2022-12-16 LAB — EBV QUANTITATIVE PCR, BLOOD: EBV VIRAL LOAD RESULT: NOT DETECTED

## 2022-12-16 LAB — IGA: GAMMAGLOBULIN; IGA: 111.6 mg/dL (ref 70.0–400.0)

## 2022-12-16 LAB — VITAMIN D 25 HYDROXY: VITAMIN D, TOTAL (25OH): 25.2 ng/mL (ref 20.0–80.0)

## 2022-12-17 ENCOUNTER — Institutional Professional Consult (permissible substitution): Admit: 2022-12-17 | Discharge: 2022-12-17 | Payer: MEDICARE

## 2022-12-17 DIAGNOSIS — D708 Other neutropenia: Principal | ICD-10-CM

## 2022-12-17 MED ADMIN — filgrastim-aafi (NIVESTYM) injection syringe 300 mcg: 300 ug | SUBCUTANEOUS | @ 17:00:00 | Stop: 2022-12-17

## 2022-12-17 NOTE — Unmapped (Signed)
Patient received of nivestym to left arm subcutaneous. Patient tolerated without difficulty. MD will notify patient for next scheduled dose.

## 2022-12-18 LAB — FSAB CLASS 1 ANTIBODY SPECIFICITY: HLA CLASS 1 ANTIBODY RESULT: POSITIVE

## 2022-12-18 LAB — HLA DS POST TRANSPLANT
ANTI-DONOR DRW #2 MFI: 262 MFI
ANTI-DONOR HLA-A #1 MFI: 1 MFI
ANTI-DONOR HLA-A #2 MFI: 0 MFI
ANTI-DONOR HLA-B #1 MFI: 11 MFI
ANTI-DONOR HLA-B #2 MFI: 24 MFI
ANTI-DONOR HLA-C #2 MFI: 1124 MFI — ABNORMAL HIGH
ANTI-DONOR HLA-DQB #1 MFI: 13 MFI
ANTI-DONOR HLA-DQB #2 MFI: 113 MFI
ANTI-DONOR HLA-DR #1 MFI: 137 MFI
ANTI-DONOR HLA-DR #2 MFI: 66 MFI

## 2022-12-18 LAB — FSAB CLASS 2 ANTIBODY SPECIFICITY
CLASS 2 ANTIBODIES IDENTIFIED: 1:1 {titer}
HLA CL2 AB RESULT: POSITIVE

## 2022-12-18 LAB — TISSUE TRANSGLUTAMINASE (TTG), IGA
TISSUE TRANSGLUTAMINASE ANTIBODY, IGA: <0.2 U/mL (ref <7)
TTG INTERPRETATION: NEGATIVE

## 2022-12-24 ENCOUNTER — Ambulatory Visit
Admit: 2022-12-24 | Discharge: 2022-12-25 | Disposition: A | Discharge status: Home / Self Care | Payer: MEDICARE | Attending: Emergency Medicine

## 2022-12-24 ENCOUNTER — Emergency Department
Admit: 2022-12-24 | Discharge: 2022-12-25 | Disposition: A | Discharge status: Home / Self Care | Payer: MEDICARE | Attending: Emergency Medicine

## 2022-12-24 LAB — CBC W/ AUTO DIFF
BASOPHILS ABSOLUTE COUNT: 0 10*9/L (ref 0.0–0.1)
BASOPHILS RELATIVE PERCENT: 1.1 %
EOSINOPHILS ABSOLUTE COUNT: 0.2 10*9/L (ref 0.0–0.5)
EOSINOPHILS RELATIVE PERCENT: 10.2 %
HEMATOCRIT: 22.8 % — ABNORMAL LOW (ref 34.0–44.0)
HEMOGLOBIN: 7.8 g/dL — ABNORMAL LOW (ref 11.3–14.9)
LYMPHOCYTES ABSOLUTE COUNT: 0.3 10*9/L — ABNORMAL LOW (ref 1.1–3.6)
LYMPHOCYTES RELATIVE PERCENT: 16.2 %
MEAN CORPUSCULAR HEMOGLOBIN CONC: 34 g/dL (ref 32.0–36.0)
MEAN CORPUSCULAR HEMOGLOBIN: 35.8 pg — ABNORMAL HIGH (ref 25.9–32.4)
MEAN CORPUSCULAR VOLUME: 105.2 fL — ABNORMAL HIGH (ref 77.6–95.7)
MEAN PLATELET VOLUME: 8.8 fL (ref 6.8–10.7)
MONOCYTES ABSOLUTE COUNT: 0.3 10*9/L (ref 0.3–0.8)
MONOCYTES RELATIVE PERCENT: 15.3 %
NEUTROPHILS ABSOLUTE COUNT: 1.1 10*9/L — ABNORMAL LOW (ref 1.8–7.8)
NEUTROPHILS RELATIVE PERCENT: 57.2 %
NUCLEATED RED BLOOD CELLS: 0 /100{WBCs} (ref <=4)
PLATELET COUNT: 173 10*9/L (ref 150–450)
RED BLOOD CELL COUNT: 2.17 10*12/L — ABNORMAL LOW (ref 3.95–5.13)
RED CELL DISTRIBUTION WIDTH: 20 % — ABNORMAL HIGH (ref 12.2–15.2)
WBC ADJUSTED: 2 10*9/L — ABNORMAL LOW (ref 3.6–11.2)

## 2022-12-24 LAB — PREGNANCY, URINE: PREGNANCY TEST URINE: POSITIVE — AB

## 2022-12-24 LAB — BASIC METABOLIC PANEL
ANION GAP: 9 mmol/L (ref 5–14)
BLOOD UREA NITROGEN: 18 mg/dL (ref 9–23)
BUN / CREAT RATIO: 10
CALCIUM: 9.1 mg/dL (ref 8.7–10.4)
CHLORIDE: 112 mmol/L — ABNORMAL HIGH (ref 98–107)
CO2: 23.9 mmol/L (ref 20.0–31.0)
CREATININE: 1.82 mg/dL — ABNORMAL HIGH
EGFR CKD-EPI (2021) FEMALE: 37 mL/min/{1.73_m2} — ABNORMAL LOW (ref >=60)
GLUCOSE RANDOM: 102 mg/dL (ref 70–179)
POTASSIUM: 4.5 mmol/L (ref 3.4–4.8)
SODIUM: 145 mmol/L (ref 135–145)

## 2022-12-24 LAB — HCG QUANTITATIVE, BLOOD: GONADOTROPIN, CHORIONIC (HCG) QUANT: 52.3 m[IU]/mL

## 2022-12-24 LAB — ALPHA GAL IGE: ALPHA-GAL IGE: <0.1 kU/L (ref <0.1)

## 2022-12-25 ENCOUNTER — Telehealth: Admit: 2022-12-25 | Discharge: 2022-12-26 | Payer: MEDICARE | Attending: Emergency | Primary: Emergency

## 2022-12-25 DIAGNOSIS — O034 Incomplete spontaneous abortion without complication: Principal | ICD-10-CM

## 2022-12-25 DIAGNOSIS — N939 Abnormal uterine and vaginal bleeding, unspecified: Principal | ICD-10-CM

## 2022-12-25 DIAGNOSIS — O0281 Inappropriate change in quantitative human chorionic gonadotropin (hCG) in early pregnancy: Principal | ICD-10-CM

## 2022-12-25 MED ORDER — METRONIDAZOLE 500 MG TABLET
ORAL_TABLET | Freq: Two times a day (BID) | ORAL | 0 refills | 7 days | Status: CP
Start: 2022-12-25 — End: 2023-01-01

## 2022-12-25 MED ORDER — AZITHROMYCIN 250 MG TABLET
ORAL_TABLET | ORAL | 0 refills | 13 days | Status: CP
Start: 2022-12-25 — End: 2023-01-07

## 2022-12-25 MED ADMIN — metroNIDAZOLE (FLAGYL) tablet 500 mg: 500 mg | ORAL | @ 08:00:00 | Stop: 2022-12-25

## 2022-12-25 MED ADMIN — azithromycin (ZITHROMAX) tablet 500 mg: 500 mg | ORAL | @ 08:00:00 | Stop: 2022-12-25

## 2022-12-25 MED ADMIN — cefTRIAXone (ROCEPHIN) 500 mg, lidocaine (PF) (XYLOCAINE-MPF) 10 mg/mL (1 %) 1.4286 mL injection: 500 mg | INTRAMUSCULAR | @ 09:00:00 | Stop: 2022-12-25

## 2022-12-25 NOTE — Unmapped (Signed)
Pt reports having intermittent vaginal bleeding x2 weeks. Sts she had a D&C done here 2 weeks ago. Also reports having a foul vaginal odor.

## 2022-12-25 NOTE — Unmapped (Signed)
Provider: Fredderick Severance, FNP  Division: [facility]    Virtual Video Encounter    Encounter Description/Consent: This encounter was conducted via Forensic psychologist with the patient. Elaine Koch reports they are located at home during the visit encounter. Discussed the choice to participate in care through the use of Telehealth service. Telehealth enables health care providers at different locations to provide safe, effective, and convenient care through the use of technology. As with any health care service, there are risks associated with the use of Telehealth, including lack of visualization and that there may be instances were they need to come to clinic to complete the assessment. Patient verbally understands the risks and benefits of Telehealth as explained. All questions regarding Telehealth answered.    Assessment/Plan:   Vaginal bleeding Following D&C:   Elaine Koch is a 37 y.o. 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 Images reviewed by attending provider.   -Today she denies any vaginal bleeding   -Discussed with patient unclear etiology of bleeding and elevated HCG  discussed could have been related to period and delayed clearance given decreased renal function, however given sexual intercourse within last 2 weeks can not rule out new pregnancy. Discussed with patient low concern for rpOC however can not exclude.   -Reviewed plan for repeat HCG tomorrow to monitor trend.   Patient will call the clinic or use MyChart should anything change or any new issues arise.    Fredderick Severance, FNP    Subjective     This 37 y.o. [phone] is a established [facility] patient who presents for a video gyn visit.    HPI:    Elaine Koch is a 37 y.o. 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 She was seen at [facility] emergency department on 11/12  for vaginal bleeding and foul smelling discharge who is 5 weeks post op from [facility] for missed AB.  Vaganitis swab positive for BV.     She reports 2 weeks of intermitted vaginal spotting that was seen with wiping and urination which ranges from brownish to bright red. She sates this past weekend she started to have heavy vaginal bleeding that required 2 tampons daily with intermitted period cramping. She reports unsure if it as her menstrual period as her periods are irregular however, states bleeding appeared heavier than normal. Today she reports the vaginal bleeding has resolved.     She reports sexual intercourse 2 weeks ago with a female partner without contraception including condoms as she desires pregnancy.       Past medical and surgical history was reviewed and updated.    Current Outpatient Medications   Medication Sig Dispense Refill    acetaminophen (TYLENOL) 325 MG tablet Take 2 tablets (650 mg total) by mouth every four (4) hours as needed for pain. 100 tablet 2    azathioprine (IMURAN) 50 mg tablet Take 2 tablets (100 mg total) by mouth daily 60 tablet 11    azithromycin (ZITHROMAX) 250 MG tablet Take 2 tablets (500 mg total) by mouth daily for 1 day, THEN 1 tablet (250 mg total) daily for  12 days. 14 tablet 0    butalbital-acetaminophen-caffeine (ESGIC) 50-325-40 mg per tablet Take 1 tablet by mouth every four (4) hours as needed for pain. 30 tablet 2    escitalopram oxalate (LEXAPRO) 5 MG tablet Take 1 tablet (5 mg total) by mouth daily. 90 tablet 3    metroNIDAZOLE (FLAGYL) 500 MG tablet Take 1 tablet (500 mg total) by mouth two (2) times a day for 7 days. 14 tablet 0    sodium bicarbonate 650 mg tablet Take 2 tablets (1,300 mg total) by mouth Three (3) times a day. 180 tablet 3    tacrolimus (ENVARSUS XR) 1 mg Tb24 extended release tablet Take 2 tablets (2 mg total) by mouth daily with FOUR 4mg  tablets for total daily dose of 18mg  60 tablet 0    tacrolimus (ENVARSUS XR) 4 mg Tb24 extended release tablet Take 4 tablets (16 mg total) by mouth daily. Take with two 1 mg tablets for a total of 18 mg 120 tablet 11     No current facility-administered medications for this visit.       Allergies   Allergen Reactions    Iodinated Contrast Media Swelling, Rash and Other (See Comments)     Burning, warmth throughout body and tingling.  Throat swelling.  Treated with benadryl and symptoms improved.  Has not had contrast since then (2013 or 2014).    Burning    Nickel Rash    Privigen [Immun Glob G(Igg)-Pro-Iga 0-50] Muscle Pain     PRIVIGEN brand: back and leg pain at usual rate of infusion during first dose, then full body pain again when rechallenged at slower rate of 30 mL/hr    Propranolol Other (See Comments) and Swelling    Eye Irrigating Solution [Ophthalmic Irrigation Solution] Other (See Comments)     Contrast dye (name?) used in eyes caused hot feeling in face, reversed with Benadryl.     Iodine      Other reaction(s): Skin Rash    Naltrexone Rash    Uni-Cortrom Rash       REVIEW OF SYSTEMS: See HPI; remaining balance of 10 systems are negative/non-contributory.    Objective:     PHYSICAL EXAM:  General: well appearing and in NAD  Psych: pleasant, engaged in conversation, answers questions appropriately     Medical Decision Making:       The patient reports they are currently: at home. I spent 13 minutes on the real-time audio and video with the patient on the date of service. I spent an additional 10 minutes on pre- and post-visit activities on the date of service.     The patient was physically located in [place] or a state in which I am permitted to provide care. The patient and/or parent/guardian understood that s/he may incur co-pays and cost sharing, and agreed to the telemedicine visit. The visit was reasonable and appropriate under the circumstances given the patient's presentation at the time.    The patient and/or parent/guardian has been advised of the potential risks and limitations of this mode of treatment (including, but not limited to, the absence of in-person examination) and has agreed to be treated using telemedicine. The patient's/patient's family's questions regarding telemedicine have been answered.     If the visit was completed in an ambulatory setting, the patient and/or parent/guardian has also been advised to contact their provider???s office for worsening conditions, and seek emergency medical treatment and/or call 911 if the patient deems either necessary.

## 2022-12-25 NOTE — Unmapped (Signed)
[facility]  Emergency Department Provider Note      ED Clinical Impression      Final diagnoses:   Vaginal discharge (Primary)   Retained products of conception after miscarriage   Bacterial vaginosis        Impression, Medical Decision Making, Progress Notes and Critical Care      Impression, Differential Diagnosis and Plan of Care    Elaine Koch is a 37 y.o. 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 This is in the setting of a D&C performed on 11/19/2022.     On exam, patient is nontoxic appearing and in no acute distress. Vital signs are WNL, hemodynamically stable, and afebrile. Pelvic exam is remarkable for moderate consistency yellow discharge. No bleeding. Some mild left adnexal tenderness. No cervical motion tenderness.    Differential includes BV versus endometritis versus other postoperative infection.  No cervical motion tenderness to suggest PID.  Consider retained products of conception.    Plan for basic labs, hCG Quant, uPreg, Vaginitis Screen, Rapid Trichomonas, Chlamydia/Gonorrhea, and US OB Transvaginal.         External Records Reviewed: Patient's most recent outpatient clinic note  Social determinants that significantly affected care: None applicable  History obtained from other sources: None    Additional Progress Notes    11 PM  Labs remarkable for anemia, appears to be at baseline.  Creatinine of 1.8, also appears to be at baseline.  Patient is positive for BV.  Signed out to oncoming attending pending transvaginal ultrasound and possible discussion with OB/GYN pending results of transvaginal ultrasound.    Portions of this record have been created using Scientist, clinical (histocompatibility and immunogenetics). Dictation errors have been sought, but may not have been identified and corrected.    See chart and resident provider documentation for details.    ____________________________________________         History        Reason for Visit  Vaginal Bleeding      HPI   Elaine Koch is a 37 y.o. 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 The patient states she has had 2 weeks of intermittent vaginal bleeding, which worsened this past weekend to similar intensity of her menstrual cycle, and malodorous vaginal discharge. She reports she is passing small blood clots. She further states she has abdominal pain, similar in intensity to menstrual cramps, and lower back pain. The patient states her symptoms are in the setting of a D&C performed on 11/19/2022 for an early missed abortion. She reports that she has not had regular menses for many years. She states she has resumed sexual intercourse after her procedure with her partner. States this is not a new partner and denies any possibility of STIs or STDs.     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)    Diabetic nephropathy (CMS-HCC)    Diabetic retinopathy (CMS-HCC)    Type 1 diabetes mellitus (CMS-HCC)    Sickle cell trait (CMS-HCC)    Other secondary hypertension    History of blood transfusion    Cardiomyopathy (CMS-HCC)    Kidney replaced by transplant    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    AKI (acute kidney injury) (CMS-HCC)    Previous cesarean delivery affecting pregnancy    Other neutropenia (CMS-HCC)    History of simultaneous kidney and pancreas transplant (CMS-HCC)    BV (bacterial vaginosis)    Chronic diarrhea of unknown origin    Worsening renal function    Anxiety and depression    Chest pain    History of migraine     Past Surgical History:   Procedure Laterality Date    AV FISTULA PLACEMENT  2018    CESAREAN SECTION      PR COLONOSCOPY FLX DX W/COLLJ SPEC WHEN PFRMD N/A 02/14/2020    Procedure: COLONOSCOPY, FLEXIBLE, PROXIMAL TO SPLENIC FLEXURE; DIAGNOSTIC, W/WO COLLECTION SPECIMEN BY BRUSH OR WASH;  Surgeon: Carmon Ginsberg, MD;  Location: [facility];  Service: Gastroenterology    PR COLONOSCOPY FLX DX W/COLLJ SPEC WHEN PFRMD N/A 06/19/2020    Procedure: COLONOSCOPY, FLEXIBLE, PROXIMAL TO SPLENIC FLEXURE; DIAGNOSTIC, W/WO COLLECTION SPECIMEN BY BRUSH OR WASH;  Surgeon: Carmon Ginsberg, MD;  Location: [facility];  Service: Gastroenterology    PR FECAL MICROBIOTA PREP INSTIL N/A 02/14/2020    Procedure: PREP W INSTILLATION FECAL MICROBIOTA, ANY METHOD;  Surgeon: Carmon Ginsberg, MD;  Location: [facility];  Service: Gastroenterology    PR INDUCED ABORTN BY DIL/EVAC N/A 03/20/2021    Procedure: DILATION AND EVACUATION - INDUCED;  Surgeon: Gaynelle Cage, MD;  Location: [facility];  Service: Family Planning    PR LAP,DIAGNOSTIC ABDOMEN N/A 11/17/2020    Procedure: DIAGNOSTIC AND OR OPERATIVE LAPAROSCOPY;  Surgeon: Estil Daft, MD;  Location: MAIN OR [facility];  Service: Womens Primary Gynecology    PR PREPARE FECAL MICROBIOTA FOR INSTILLATION  06/19/2020    Procedure: PREP FECAL MICROBIOTA FOR INSTILLATION, INCLUDING ASSESSMENT OF DONOR SPECIMEN;  Surgeon: Carmon Ginsberg, MD;  Location: [facility];  Service: Gastroenterology    PR SURG RX MISSED ABORTN,1ST TRI N/A 11/17/2020    Procedure: VACUUM ASPIRATION;  Surgeon: Estil Daft, MD;  Location: MAIN OR [facility];  Service: [name] Primary Gynecology    PR SURG RX MISSED ABORTN,1ST TRI N/A 11/19/2022    Procedure: TREATMENT OF MISSED ABORTION, COMPLETED SURGICALLY; FIRST TRIMESTER;  Surgeon: Asencion Partridge, MD;  Location: [facility] OR [facility];  Service: [facility] Primary Gynecology    PR TRANSPLANT ALLOGRAFT PANCREAS N/A 05/03/2019    Procedure: TRANSPLANTATION OF PANCREATIC ALLOGRAFT;  Surgeon: Leona Carry, MD;  Location: MAIN OR [facility];  Service: Transplant    PR TRANSPLANT,PREP CADAVER RENAL GRAFT N/A 05/03/2019    Procedure: [facility] STD PREP CAD DONR RENAL ALLOGFT PRIOR TO TRNSPLNT, INCL DISSEC/REM PERINEPH FAT, DIAPH/RTPER ATTAC;  Surgeon: Leona Carry, MD;  Location: MAIN OR [facility];  Service: Transplant    PR TRANSPLANT,PREP DONOR PANCREAS N/A 05/03/2019    Procedure: [facility] STANDARD PREPARATION OF CADAVER DONOR PANCREAS ALLOGRAFT  PRIOR TO TRANSPLANTATION;  Surgeon: Leona Carry, MD;  Location: MAIN OR [facility];  Service: Transplant    PR TRANSPLANTATION OF KIDNEY N/A 05/03/2019    Procedure: RENAL ALLOTRANSPLANTATION, IMPLANTATION OF GRAFT; WITHOUT RECIPIENT NEPHRECTOMY;  Surgeon: Leona Carry, MD;  Location: MAIN OR [facility];  Service: Transplant    TONSILLECTOMY         No current facility-administered medications for this encounter.    Current Outpatient Medications:     acetaminophen (TYLENOL) 325 MG tablet, Take 2 tablets (650 mg total) by mouth every four (4) hours as needed for pain., Disp: 100 tablet, Rfl: 2    azathioprine (IMURAN) 50 mg tablet, Take 2 tablets (100 mg total) by mouth daily, Disp: 60 tablet, Rfl: 11    butalbital-acetaminophen-caffeine (ESGIC) 50-325-40 mg per tablet, Take 1 tablet by mouth every four (4) hours as needed for pain., Disp: 30 tablet, Rfl: 2    escitalopram oxalate (LEXAPRO) 5 MG tablet, Take 1 tablet (5 mg total) by mouth daily., Disp: 90 tablet, Rfl: 3    sodium bicarbonate 650 mg tablet, Take 2 tablets (1,300 mg total) by mouth Three (3) times a day., Disp: 180 tablet, Rfl: 3    tacrolimus (ENVARSUS XR) 1 mg Tb24 extended release tablet, Take 2 tablets (2 mg total) by mouth daily with FOUR 4mg  tablets for total daily dose of 18mg , Disp: 60 tablet, Rfl: 0    tacrolimus (ENVARSUS XR) 4 mg Tb24 extended release tablet, Take 4 tablets (16 mg total) by mouth daily. Take with two 1 mg tablets for a total of 18 mg, Disp: 120 tablet, Rfl: 11    Allergies  Iodinated contrast media, Nickel, Privigen [immun glob g(igg)-pro-iga 0-50], Propranolol, Eye irrigating solution [ophthalmic irrigation solution], Iodine, Naltrexone, and Uni-cortrom    Family History   Problem Relation Age of Onset    Diabetes Mother     Diabetes Father     Cancer Maternal Grandmother     Diabetes Maternal Grandfather     Diabetes Paternal Grandmother        Social History  Social History     Tobacco Use    Smoking status: Never    Smokeless tobacco: Never   Vaping Use    Vaping status: Never Used   Substance Use Topics    Alcohol use: Never    Drug use: Never      Physical Exam     This provider entered the patient's room: Yes:    If this provider did not enter the room, a comprehensive physical exam was not able to be performed due to increased infection risk to themselves, other providers, staff and other patients), as well as to conserve personal protective equipment (PPE) utilization during the COVID-19 pandemic.    If this provider did enter the patient room, the following was PPE worn: Surgical mask, eye protection and gloves     ED Triage Vitals [12/24/22 1756]   Enc Vitals Group      BP 135/78      Heart Rate 77      SpO2 Pulse       Resp 16      Temp 36.8 ??C (98.3 ??F)      Temp Source Oral      SpO2 100 %      Weight 71.6 kg (157 lb 14.4 oz)     Constitutional: Alert and oriented. Well appearing and in no distress.  Eyes: Conjunctivae are normal.  ENT  Head: Normocephalic and atraumatic.       Nose: No congestion.       Mouth/Throat: Mucous membranes are moist.       Neck: No stridor.  Hematological/Lymphatic/Immunilogical: No cervical lymphadenopathy.  Cardiovascular: Normal rate, regular rhythm. Normal and symmetric distal pulses are present in all extremities.  Respiratory: Normal respiratory effort. Breath sounds are normal.  Gastrointestinal: Soft and nontender. There is no CVA tenderness.  Genitourinary: Moderate consistency yellow discharge. No bleeding. Some mild left adnexal tenderness. No cervical motion tenderness. No fundal tenderness.   Musculoskeletal: Normal range of motion in all extremities.       Right lower leg: No tenderness or edema.       Left lower leg: No tenderness or edema.  Neurologic: Normal speech and language. No gross focal neurologic deficits are appreciated.  Skin: Skin is warm, dry and intact. No rash noted.  Psychiatric: Mood and affect are normal. Speech and behavior are normal.     Radiology     [place] Endovaginal (Non-OB)    (Results Pending)      Procedures     None    Documentation assistance was provided by Ricka Burdock, Scribe on December 24, 2022 at 9:50 PM for Shaune Leeks, MD.     December 25, 2022 4:38 PM. Documentation assistance provided by the scribe. I was present during the time the encounter was recorded. The information recorded by the scribe was done at my direction and has been reviewed and validated by me.        Sherryl Barters, MD  12/25/22 1640

## 2022-12-25 NOTE — Unmapped (Addendum)
Virtual Consult: [facility] ED     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     Patient c/o 2 weeks of VB that has worsened to level of a period in past several days. She also notes malodorous vaginal discharge.     - HDS - normal vital signs   - No leukocytosis - though patient is leukopenic and immunocompromised at baseline   - Hb around baseline at 7.8   - ED provider exam: No abd pain, no TTP, no fundal tenderness, no CMT. Moderate thin yellow discharge on exam.   - bHCG 52 (decreased from 134,804 on 9/22)   - + BV on wet prep. GC/CT pending   - TVUS: Complex heterogeneous focus in the uterine fundus contiguous with the endometrium, new since September may represent hemorrhage or retained products of conception     Ddx is broad and includes menses with BV infection (normal post operative course complicated by BV), endometritis, retained products of conception, and new pregnancy.     - Upon review of imaging, low concern for clinically significant retained products of conception that would require re-instrumentation of the uterus - no blood flow, thin stripe, with small cystic structure.   - Persistently elevated beta may be due to delayed clearance given decreased renal function (GFR 37)  - While endometritis is also less likely given no fundal tenderness, WBC, fever, or vital sign changes, plan to treat with antibiotics given patient is immunocompromised and may therefore not mount a recognizable response.   - 48h bHCG to evaluate for possible developing pregnancy (also less likely given timeline, but patient has been having unprotected intercourse)     In summary, our recommendations are:     - Ceftriaxone 500 mg IM   - Azithromycin 500 mg x2 days followed by 250 mg x 14d (avoid doxy dt remote possibility of new pregnancy)   - Flagyl 500 mg BID x 14d   - bHCG in 48h   - Will arrange for outpatient follow up with either Family planning or Same day clinic     Plan discussed with Dr. Dallie Dad, PGY-2  Obstetrics and Gynecology  [place]

## 2022-12-25 NOTE — Unmapped (Signed)
 ED ATTENDING PROGRESS NOTE  December 24, 2022 11:17 PM    I received this patient in signout from Dr. Shaune Leeks at the start of my shift.   Elaine Koch is a 37 y.o. female [phone] with a past medical history of stage III CKD, s/p dual kidney/pancreatic transplant in 04/2019, T1DM, sickle cell trait, and anxiety/depression who presented with 2 weeks of intermittent vaginal bleeding that acutely worsened over the past few days with associated malodorous vaginal discharge. Of note, this is in the setting of recent D&C on 11/19/2022. Pelvic exam remarkable for moderate consistency yellow discharge, no bleeding, with mild adnexal tenderness. No CMT.     CBC with Hgb 7.8, down from 8.5 eleven days ago. BMP with Cr 1.82 appears improved from prior. hCG quant 52.3. U-preg positive. BV positive. Trich negative.     At the time of transfer of care, further workup and plan pending: TVUS.     US OB Transvaginal   Final Result   1.  No definite intrauterine pregnancy identified.   2.  Complex heterogeneous focus in the uterine fundus contiguous with the endometrium, new since September may represent hemorrhage or retained products of conception.                             Plan:   Will contact OBGYN following TVUS results for their recommendations.      ED Course as of 12/26/22 1531   Wed Dec 25, 2022   0139 Spoke with gynecology team and they will review her imaging and follow up with recommendations.    4540 Discussed with Dr. Thurmond Butts from gynecology and they reviewed her findings. Differential includes retained products, new pregnancy, endometriosis. Their recommending empiric treatment with ceftriaxone, azithromycin and Flagyl. They will arrange for follow-up in their clinic in the next 24 to 48 hours. Their team will call to set up follow up   0403 QTC is for 48 on EKG. Will recommend repeat EKG in several days for QT monitoring          ED Clinical Impression     Final diagnoses:   Vaginal discharge (Primary)   Retained products of conception after miscarriage   Bacterial vaginosis       Documentation assistance was provided by Johnathan Hausen, Scribe, on December 24, 2022 at 11:17 PM for Army Melia, MD.    Documentation assistance was provided by the scribe in my presence.  The documentation recorded by the scribe has been reviewed by me and accurately reflects the services I personally performed.

## 2022-12-26 NOTE — Unmapped (Signed)
Called pt for HCG reminder, pt verbalized understanding and will complete labs today.

## 2022-12-27 DIAGNOSIS — Z940 Kidney transplant status: Principal | ICD-10-CM

## 2022-12-27 MED ORDER — ENVARSUS XR 1 MG TABLET,EXTENDED RELEASE
ORAL_TABLET | Freq: Every day | ORAL | 0 refills | 30 days
Start: 2022-12-27 — End: ?

## 2022-12-28 NOTE — Unmapped (Signed)
Gynecology Telephone Note    [name] is a 37 y.o. [phone] F who is being followed on the beta list for PUL. The patient was called to remind her to come in for her repeat beta hCG level. The patient states when she went to get her lab it was closed yesterday. She reports she plans to go 11/16 at [facility] Discussed with patient will send Lab times and locations via Mychart.  She currently denies vaginal bleeding or pain.      Herma Carson, NP   [facility] Obstetrics & Gynecology

## 2022-12-29 MED ORDER — TACROLIMUS XR 1 MG TABLET,EXTENDED RELEASE 24 HR
ORAL_TABLET | Freq: Every day | ORAL | 0 refills | 30 days | Status: CP
Start: 2022-12-29 — End: ?
  Filled 2023-01-06: qty 60, 30d supply, fill #0

## 2022-12-30 ENCOUNTER — Ambulatory Visit: Admit: 2022-12-30 | Discharge: 2022-12-31 | Payer: MEDICARE

## 2022-12-30 DIAGNOSIS — Z940 Kidney transplant status: Principal | ICD-10-CM

## 2022-12-30 DIAGNOSIS — Z79899 Other long term (current) drug therapy: Principal | ICD-10-CM

## 2022-12-30 LAB — BASIC METABOLIC PANEL
ANION GAP: 7 mmol/L (ref 5–14)
BLOOD UREA NITROGEN: 21 mg/dL (ref 9–23)
BUN / CREAT RATIO: 10
CALCIUM: 9.6 mg/dL (ref 8.7–10.4)
CHLORIDE: 111 mmol/L — ABNORMAL HIGH (ref 98–107)
CO2: 23.9 mmol/L (ref 20.0–31.0)
CREATININE: 2.1 mg/dL — ABNORMAL HIGH (ref 0.55–1.02)
EGFR CKD-EPI (2021) FEMALE: 31 mL/min/{1.73_m2} — ABNORMAL LOW (ref >=60)
GLUCOSE RANDOM: 96 mg/dL (ref 70–179)
POTASSIUM: 4.8 mmol/L (ref 3.4–4.8)
SODIUM: 142 mmol/L (ref 135–145)

## 2022-12-30 LAB — CBC W/ AUTO DIFF
BASOPHILS ABSOLUTE COUNT: 0 10*9/L (ref 0.0–0.1)
BASOPHILS RELATIVE PERCENT: 2 %
EOSINOPHILS ABSOLUTE COUNT: 0.3 10*9/L (ref 0.0–0.5)
EOSINOPHILS RELATIVE PERCENT: 12.1 %
HEMATOCRIT: 24.3 % — ABNORMAL LOW (ref 34.0–44.0)
HEMOGLOBIN: 8.4 g/dL — ABNORMAL LOW (ref 11.3–14.9)
LYMPHOCYTES ABSOLUTE COUNT: 0.3 10*9/L — ABNORMAL LOW (ref 1.1–3.6)
LYMPHOCYTES RELATIVE PERCENT: 15.7 %
MEAN CORPUSCULAR HEMOGLOBIN CONC: 34.4 g/dL (ref 32.0–36.0)
MEAN CORPUSCULAR HEMOGLOBIN: 36.1 pg — ABNORMAL HIGH (ref 25.9–32.4)
MEAN CORPUSCULAR VOLUME: 104.8 fL — ABNORMAL HIGH (ref 77.6–95.7)
MEAN PLATELET VOLUME: 8.1 fL (ref 6.8–10.7)
MONOCYTES ABSOLUTE COUNT: 0.2 10*9/L — ABNORMAL LOW (ref 0.3–0.8)
MONOCYTES RELATIVE PERCENT: 9.8 %
NEUTROPHILS ABSOLUTE COUNT: 1.3 10*9/L — ABNORMAL LOW (ref 1.8–7.8)
NEUTROPHILS RELATIVE PERCENT: 60.4 %
NUCLEATED RED BLOOD CELLS: 0 /100{WBCs} (ref <=4)
PLATELET COUNT: 201 10*9/L (ref 150–450)
RED BLOOD CELL COUNT: 2.32 10*12/L — ABNORMAL LOW (ref 3.95–5.13)
RED CELL DISTRIBUTION WIDTH: 20 % — ABNORMAL HIGH (ref 12.2–15.2)
WBC ADJUSTED: 2.1 10*9/L — ABNORMAL LOW (ref 3.6–11.2)

## 2022-12-30 LAB — PHOSPHORUS: PHOSPHORUS: 2.9 mg/dL (ref 2.4–5.1)

## 2022-12-30 LAB — HCG QUANTITATIVE, BLOOD: GONADOTROPIN, CHORIONIC (HCG) QUANT: 27.1 m[IU]/mL

## 2022-12-30 LAB — MAGNESIUM: MAGNESIUM: 1.6 mg/dL (ref 1.6–2.6)

## 2022-12-30 LAB — TACROLIMUS LEVEL: TACROLIMUS BLOOD: 8.1 ng/mL

## 2022-12-30 NOTE — Unmapped (Signed)
Patient called [facility] at [facility] Nurse Triage line with questions about Beta hCG leval and lab work drawn this morning.  Nurse spoke to [facility] [place], can add her Beta hCG level to the blood that was drawn this morning.  Nurse advised pt lab can be done, verbalizes understanding.

## 2022-12-30 NOTE — Unmapped (Signed)
Beta List Patient Page:    Prior to discussing medical health information, patient confirmed her full name and date of birth.     Chief Complaint:     H&P: Hawkey is a 78 yrs year old calling to discuss beta hcg testing. Patient is on beta list for PUL. She was supposed to go to [facility] lab on 11/16 but reports she has been unable to go due to her work schedule. Recommended she get her beta hcg on Monday.  Patient denies, abdominal pain, vaginal bleeding or emesis but does have some mild nausea.     Damita Lack, MD  Resident/PGY2  Department of Obstetrics and Gynecology  [place] [facility] [place] [facility]

## 2022-12-31 ENCOUNTER — Telehealth: Admit: 2022-12-31 | Discharge: 2023-01-01 | Payer: MEDICARE | Attending: Clinical | Primary: Clinical

## 2022-12-31 DIAGNOSIS — F4310 Post-traumatic stress disorder, unspecified: Principal | ICD-10-CM

## 2022-12-31 NOTE — Unmapped (Signed)
Confidential Psychological Therapy Session  [facility]     Patient Name: Elaine Koch  Medical Record Number: [id number]  Date of Service: December 31, 2022  Clinical Psychologist: Artemio Aly, PhD  Intern: None  Time Spent: 53 min of face-to-face counseling  CPT Procedure Code: 82956 (60 min psychotherapy with patient and/or family)  Therapy Type: Behavior Modifying/Cognitive Behavioral Therapy (CBT)  Purpose of Treatment: assess current MH needs, reduce depression symptoms, reduce anxiety symptoms, improve quality of life, improve coping, adjustment to chronic medical illness, safety planning     *This patient was not seen in person to minimize potential spread of COVID-19, protect patients/family/providers, and reduce PPE utilization. During this time, transplant psychology will be limiting person-to-person contact when possible.*             The patient reports they are physically located in [place] and is currently: not at home (at [employer] at work, located in [place]). I conducted a audio/video visit. I spent  17m 33s on the video call with the patient. I spent an additional 30 minutes on pre- and post-visit activities on the date of service .      The limits of confidentiality and the purpose of the evaluation were reviewed. The patient was provided with a verbal description of the nature and purpose of the psychological evaluation. I also reviewed the referral source, specific referral question for this evaluation, foreseeable risks/discomforts, benefits, limits of confidentiality, and mandatory reporting requirements of this provider. The patient was given the opportunity to ask questions and receive answers about the present evaluation. Oral consent was provided by the patient.      This evaluation note may contain sensitive and confidential information regarding the patient???s psychosocial adjustment to living with a chronic medical condition. DO NOT share this information outside [facility] without written consent from the patient explicitly stating that mental health records may be released.      Referral/Relevant History:  Elaine Koch is a very pleasant 37 y.o.  female who presents for cognitive behavioral therapy to clarify current MH needs and address symptoms of depression/anxiety that were recently reported during her most recent post-transplant nephrology follow-up with Dr. Lucienne Minks True on 12/13/22, primarily in context of recent pregnancy loss. Elaine Koch has previously been referred for similar concerns in April 2024 and had been working with Clinical research associate until July 2024 (missed an appt in August after starting new job).  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      Elaine Koch met with writer initially on 07/12/22, when she endorsed symptoms consistent with PTSD, MDD Recurrent, and panic attacks, as well as frequent passive SI. She was engaged with a PHP through [facility] which had been beneficial, but as this was only a 21-day program, Clinical research associate agreed to continue to follow her for support until she is able to connect with a consistent, long-term provider; this nearly happened at [facility] but she was unable to attend that appt and never established outpatient MH care. She was seen today for her 5th appt with Clinical research associate.     Review of Symptoms/ROS: Deferred     Subjective:   Elaine Koch reported that a lot has been going on since our last visit in July, with the primary stressor being a recent pregnancy loss and D&C. The past few weeks since this happened have been really hard, and she endorsed current symptoms of depression including anhedonia (don't want to get out of bed),  depressed mood, passive SI, and hopelessness. She also acknowledged ongoing stress related to her new job (she loves her new job, but she has to drive from [place] to [place] daily and the job pays less than her previous one), finances, and her health, though thankfully after a rejection scare her current kidney/pancreas numbers are stable. At the same time, Elaine Koch acknowledged that her new relationship continues to be very supportive, and she does enjoy her new job much more than the old one.     Elaine Koch stated that compared to when we first started meeting (April/May 2024), she feels less hopeless because her job and relationship are going well. However, she did acknowledge frequent passive SI after her pregnancy loss (don't want to be here). These have improved over the past several weeks but are still ongoing at times. She denied any current intent/plan. She also restarted her Lexapro after last Nephrology visit after stopping it in September because she could not get an appointment at [facility] for refills, which she thinks has helped somewhat. She is not currently engaged in [facility] care but is interested in at least monthly check-ins for support, and was amenable to writer sending her resources specific for coping with pregnancy loss and/or fertility concerns.    Time was spent today reviewing some skills that were introduced during treatment several months ago. Elaine Koch continues to practice diaphragmatic breathing and PMR, though this is usually more helpful after she has felt strong emotions than in the moment. We also reviewed some cognitive restructuring skills, like trying to catch automatic negative thoughts early to challenge them. We used several specific examples for this, including angry thoughts while in traffic on the way home, as well as thoughts that her body is broken because of the miscarriage and that she will never be able to [get pregnant]. She reflected on how she feels like she should not still be grieving her first pregnancy loss, so psychoeducation was provided about grief and her reaction was normalized.       Objective:   Mental Status Exam:  Appearance: Appears stated age and Clean/Neat.  Motor: No abnormal movements  Speech/Language: Normal rate, volume, tone, fluency  Mood: Depressed and Anxious  Affect: Anxious, Depressed, and Mood congruent  Thought Process: Logical, linear, clear, coherent, goal directed  Thought Content: Suicidal Ideation, passive, denies plan/intent; denies HI or A/VH  Perceptual Disturbances: Denies auditory and visual hallucinations, behavior not concerning for response to internal stimuli  Orientation: Oriented to person, place, time, and general circumstances  Attention: Able to fully attend without fluctuations in consciousness  Concentration: Able to fully concentrate and attend  Memory: Immediate, short-term, long-term, and recall grossly intact  Fund of Knowledge: Consistent with level of education and development  Insight: Fair  Judgment: Fair  Impulse Control: Fair        Assessment:  Ms.  Cantor participated well in this CBT session and exhibits good motivation towards treatment goals. Today she reported ongoing symptoms of depression and anxiety, more so depression recently following a pregnancy loss. While objective measures were not done today, she reported that her depression is better than it was when she was engaged in a PHP earlier this year but worse than prior to pregnancy loss. She reported that she has continued to use some effective coping strategies, like PMR and diaphragmatic breathing, and was very open to practicing cognitive restructuring skills today. Good support from family and her new boyfriend also likely contributes to improved mood.  Taken altogether, Ms. Mapes appears to be experiencing moderate symptoms of depression and anxiety at this time, though with good engagement in using CBT-oriented skills, she seems to be utilizing more effective coping strategies lately and has reported some improvements in mood; She would benefit from consistent, active engagement in [facility] care to treat these symptoms, but work schedule and finances are barriers to this. She was amenable to monthly check-ins with Clinical research associate for now, and we will continue to explore other treatment options. Specifically, Clinical research associate will attempt to find and send her resources for fertility/coping with pregnancy loss, which she was agreeable to.      Focus on current treatment is assessing current MH needs, safety planning, treatment planning.      Focus on future sessions will include ongoing assessment and safety planning, introducing and practicing CBT-oriented coping strategies.     Adherence concerns: None reported today, though previous nephrology notes suggest some issues with obtaining labs consistently and disjointed care (more ED visits than outpatient nephrology follow-up).     Diagnostic Impression: Posttraumatic Stress Disorder; Major Depressive Disorder, Severe, Recurrent; Unspecified Anxiety Disorder (panic attacks); R/O Bipolar Disorder        Risk Assessment:  A suicide and violence risk assessment was performed as part of this evaluation. The patient is deemed to be at chronic elevated risk for self-harm/suicide given the following factors: recent bereavement, recent trauma, current diagnosis of depression, hopelessness, previous acts of self-harm, suicidal ideation or threats without a plan, chronic severe medical condition, chronic mental illness > 5 years, and past diagnosis of depression. The patient is deemed to be at chronic elevated risk for violence given the following factors: recent loss. These risk factors are mitigated by the following factors: no known access to weapons or firearms, no history of violence, motivation for treatment, supportive family, sense of responsibility to family and social supports, minor children living at home, presence of an available support system, employment or functioning in a structured work/academic setting, religious or spiritual prohibition to suicide/violence, and safe housing. There is no acute risk for suicide or violence at this time. The patient was educated about relevant modifiable risk factors including following recommendations for treatment of psychiatric illness and abstaining from substance abuse.               While future psychiatric events cannot be accurately predicted, the patient does not currently require  acute inpatient psychiatric care and does not currently meet [facility] involuntary commitment criteria.               Psychometric Testing: None administered today.          Plan:  Writer will attempt to meet with Ms. Grismore as a bridge until she can establish consistent MH care, though work and finances have been a barrier to this and she was lost to follow-up with this Clinical research associate previously. Specific skills she was encouraged to practice include diaphragmatic breathing, PMR, and cognitive restructuring.      Ms.  Steiniger will return Wednesday 01/22/23 at 12:00pm via video.       Ms.  Robards was given this writer's contact information with confidential voice mail number and instructed to call 911 for emergencies.

## 2022-12-31 NOTE — Unmapped (Signed)
Gynecology Telephone Note      Ms. Kevonna Ocegueda is a 37 y.o. [phone] F who is being followed on the beta list for PUL. The patient was called to review her most recent lab work. She was informed that her recent beta hCG level was 27.1, previously 52.3 (48% decrease). She was advised results likely represent low downtrend. Discussed with patient would recommend recheck in 1 week,  She is agreeable to the plan. She denies pelvic pain and vaginal bleeding today.     Reviewed precautions:   --Heavy vaginal bleeding (soaking more than 1 pad per hour for 2 hours)          The patient reports she continues to have vaginal discharge with odor and has two days left of BV treatment. Dicussed with patient would recommend continued treatment if continues would recommend visit for evaluation next. Patient states she would like to have appointment for follow-up.       Herma Carson, NP   [facility] Obstetrics & Gynecology

## 2023-01-02 DIAGNOSIS — O0281 Inappropriate change in quantitative human chorionic gonadotropin (hCG) in early pregnancy: Principal | ICD-10-CM

## 2023-01-02 DIAGNOSIS — O034 Incomplete spontaneous abortion without complication: Principal | ICD-10-CM

## 2023-01-03 NOTE — Unmapped (Signed)
[facility] Specialty and Home Delivery Pharmacy Refill Coordination Note    Specialty Medication(s) to be Shipped:   Transplant: Envarsus XR 1mg , Envarsus XR 4mg , and Azathioprine 50 mg    Other medication(s) to be shipped: No additional medications requested for fill at this time     Elaine Koch, DOB: [birth year]  Phone: There are no phone numbers on file.      All above HIPAA information was verified with patient.     Was a Nurse, learning disability used for this call? No    Completed refill call assessment today to schedule patient's medication shipment from the [facility]  [phone]).  All relevant notes have been reviewed.     Specialty medication(s) and dose(s) confirmed: Regimen is correct and unchanged.   Changes to medications: Elaine Koch reports no changes at this time.  Changes to insurance: No  New side effects reported not previously addressed with a pharmacist or physician: None reported  Questions for the pharmacist: No    Confirmed patient received a Conservation officer, historic buildings and a Surveyor, mining with first shipment. The patient will receive a drug information handout for each medication shipped and additional FDA Medication Guides as required.       DISEASE/MEDICATION-SPECIFIC INFORMATION        N/A    SPECIALTY MEDICATION ADHERENCE     Medication Adherence    Patient reported X missed doses in the last month: 0  Specialty Medication: ENVARSUS XR 4 mg Tb24 extended release tablet (tacrolimus)  Patient is on additional specialty medications: Yes  Additional Specialty Medications: ENVARSUS XR 1 mg Tb24 extended release tablet (tacrolimus)  Patient Reported Additional Medication X Missed Doses in the Last Month: 0  Patient is on more than two specialty medications: Yes  Specialty Medication: azathioprine 50 mg tablet (IMURAN)  Patient Reported Additional Medication X Missed Doses in the Last Month: 0  Any gaps in refill history greater than 2 weeks in the last 3 months: no  Demonstrates understanding of importance of adherence: yes              Were doses missed due to medication being on hold? No    azathioprine 50   mg: 7 days of medicine on hand   ENVARSUS XR 1  mg: 7 days of medicine on hand   ENVARSUS XR 4 mg: 7 days of medicine on hand       REFERRAL TO PHARMACIST     Referral to the pharmacist: Not needed      SHIPPING     Shipping address confirmed in Epic.       Delivery Scheduled: Yes, Expected medication delivery date: 01/07/23.     Medication will be delivered via UPS to the prescription address in Epic WAM.    Moshe Salisbury   [facility] Specialty and Home Delivery Pharmacy  Specialty Technician

## 2023-01-06 ENCOUNTER — Ambulatory Visit: Admit: 2023-01-06 | Discharge: 2023-01-06 | Payer: MEDICARE | Attending: Emergency | Primary: Emergency

## 2023-01-06 ENCOUNTER — Ambulatory Visit: Admit: 2023-01-06 | Discharge: 2023-01-06 | Payer: MEDICARE

## 2023-01-06 DIAGNOSIS — N939 Abnormal uterine and vaginal bleeding, unspecified: Principal | ICD-10-CM

## 2023-01-06 DIAGNOSIS — Z3490 Encounter for supervision of normal pregnancy, unspecified, unspecified trimester: Principal | ICD-10-CM

## 2023-01-06 DIAGNOSIS — O0281 Inappropriate change in quantitative human chorionic gonadotropin (hCG) in early pregnancy: Principal | ICD-10-CM

## 2023-01-06 DIAGNOSIS — O034 Incomplete spontaneous abortion without complication: Principal | ICD-10-CM

## 2023-01-06 DIAGNOSIS — O3680X0 Pregnancy with inconclusive fetal viability, not applicable or unspecified: Principal | ICD-10-CM

## 2023-01-06 DIAGNOSIS — N760 Acute vaginitis: Principal | ICD-10-CM

## 2023-01-06 DIAGNOSIS — Z940 Kidney transplant status: Principal | ICD-10-CM

## 2023-01-06 DIAGNOSIS — B9689 Other specified bacterial agents as the cause of diseases classified elsewhere: Principal | ICD-10-CM

## 2023-01-06 LAB — BASIC METABOLIC PANEL
ANION GAP: 7 mmol/L (ref 5–14)
BLOOD UREA NITROGEN: 23 mg/dL (ref 9–23)
BUN / CREAT RATIO: 10
CALCIUM: 9.1 mg/dL (ref 8.7–10.4)
CHLORIDE: 109 mmol/L — ABNORMAL HIGH (ref 98–107)
CO2: 26 mmol/L (ref 20.0–31.0)
CREATININE: 2.27 mg/dL — ABNORMAL HIGH (ref 0.55–1.02)
EGFR CKD-EPI (2021) FEMALE: 28 mL/min/{1.73_m2} — ABNORMAL LOW (ref >=60)
GLUCOSE RANDOM: 92 mg/dL (ref 70–99)
POTASSIUM: 4.6 mmol/L (ref 3.4–4.8)
SODIUM: 142 mmol/L (ref 135–145)

## 2023-01-06 LAB — MAGNESIUM: MAGNESIUM: 1.6 mg/dL (ref 1.6–2.6)

## 2023-01-06 LAB — VITAMIN D 1,25 DIHYDROXY

## 2023-01-06 LAB — HCG QUANTITATIVE, BLOOD: GONADOTROPIN, CHORIONIC (HCG) QUANT: 15.7 m[IU]/mL

## 2023-01-06 LAB — PHOSPHORUS: PHOSPHORUS: 4 mg/dL (ref 2.4–5.1)

## 2023-01-06 MED FILL — ENVARSUS XR 4 MG TABLET,EXTENDED RELEASE: ORAL | 30 days supply | Qty: 120 | Fill #3

## 2023-01-06 MED FILL — AZATHIOPRINE 50 MG TABLET: ORAL | 30 days supply | Qty: 60 | Fill #11

## 2023-01-06 NOTE — Unmapped (Signed)
Outpatient Gynecology Note - New Patient    ASSESSMENT AND PLAN   Vaginitis:   Continued thick vaginal discharge that is characterized as thin white discharge with associated odor.   -Vaganitis swab collected.   Discussed with patient will Rx based on results. If positive for BV likely will trial Metrogel.       Follow-up of vaginal bleeding following D&C:   Denies any vaginal bleeding since last visit.   HCG: 134,804(9/22)> 52.3(11/12)> 27.1(11/181)> 15.7(11/25)   Discussed with patient with decline in HGC would recommend 2 week UPT and call if positive.   -The patient is sexual active with female partner without condom use. Discussed with patient can cause UPT positive in 2 weeks which would require we following her HCG.        Fredderick Severance, FNP    SUBJECTIVE     This 37 y.o. [phone] is a new [facility] patient who presents for a problem visit.    HPI:  The patient was seen in [facility] for follow-up of HCG lab trend after concern for retained products of conception. The patient GFR 37 and thought slow declined due to decrease renal function.  She denies any additional vaginal bleeding. She reports she is sexually active with a female partner and denies contraception.     She reports after being treated for BV she continues to have vaginal discharge and concerned if resolved . She describes the discharge as white thin discharge with odor. She denies itching burning, or discomfort.      HCG: 134,804(9/22)> 52.3(11/12)> 27.1(11/181)> 15.7(11/25)     GYN History:   Menstrual cycles: irregular  Last Pap:  2022  NIL HPV negative   Sexually active: yes    Sex partner(s): female  Contraceptive method: none     OB History   Gravida Para Term Preterm AB Living   4 1 0 1 2 1    SAB IAB Ectopic Molar Multiple Live Births   0 1 0 0  1      # Outcome Date GA Lbr Len/2nd Weight Sex Type Anes PTL Lv   4 Current            3 IAB 2023           2 AB 11/02/20 [redacted]w[redacted]d          1 Preterm 10/19/15 [redacted]w[redacted]d  830 g (1 lb 13.3 oz) M CS-LTranv Spinal LIV      Birth Comments: preterm      Obstetric Comments   Denies STI   Denies abnormal pap smear s       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       Past Surgical History:   Procedure Laterality Date    AV FISTULA PLACEMENT  2018    CESAREAN SECTION      PR COLONOSCOPY FLX DX W/COLLJ SPEC WHEN PFRMD N/A 02/14/2020    Procedure: COLONOSCOPY, FLEXIBLE, PROXIMAL TO SPLENIC FLEXURE; DIAGNOSTIC, W/WO COLLECTION SPECIMEN BY BRUSH OR WASH;  Surgeon: Carmon Ginsberg, MD;  Location: [facility]URES [facility];  Service: Gastroenterology    PR COLONOSCOPY FLX DX W/COLLJ SPEC WHEN PFRMD N/A  06/19/2020    Procedure: COLONOSCOPY, FLEXIBLE, PROXIMAL TO SPLENIC FLEXURE; DIAGNOSTIC, W/WO COLLECTION SPECIMEN BY BRUSH OR WASH;  Surgeon: Carmon Ginsberg, MD;  Location: GI PROC[facility]URES [facility];  Service: Gastroenterology    PR FECAL MICROBIOTA PREP INSTIL N/A 02/14/2020    Procedure: PREP W INSTILLATION FECAL MICROBIOTA, ANY METHOD;  Surgeon: Carmon Ginsberg, MD;  Location: GI [facility]URES [facility];  Service: Gastroenterology    PR INDUCED ABORTN BY DIL/EVAC N/A 03/20/2021    Procedure: DILATION AND EVACUATION - INDUCED;  Surgeon: Gaynelle Cage, MD;  [facility] OR [facility];  Service: Family Planning    PR LAP,DIAGNOSTIC ABDOMEN N/A 11/17/2020    Procedure: DIAGNOSTIC AND OR OPERATIVE LAPAROSCOPY;  Surgeon: Estil Daft, MD;  Location: MAIN OR [facility];  Service: Womens Primary Gynecology    PR PREPARE FECAL MICROBIOTA FOR INSTILLATION  06/19/2020    Procedure: PREP FECAL MICROBIOTA FOR INSTILLATION, INCLUDING ASSESSMENT OF DONOR SPECIMEN;  Surgeon: Carmon Ginsberg, MD; Location: GI [facility]URES [facility];  Service: Gastroenterology    PR SURG RX MISSED ABORTN,1ST TRI N/A 11/17/2020    Procedure: VACUUM ASPIRATION;  Surgeon: Estil Daft, MD;  Location: MAIN OR [facility];  Service: [facility] Primary Gynecology    PR SURG RX MISSED ABORTN,1ST TRI N/A 11/19/2022    Procedure: TREATMENT OF MISSED ABORTION, COMPLETED SURGICALLY; FIRST TRIMESTER;  Surgeon: Asencion Partridge, MD;  Location: [facility] OR [facility];  Service: [facility] Primary Gynecology    PR TRANSPLANT ALLOGRAFT PANCREAS N/A 05/03/2019    Procedure: TRANSPLANTATION OF PANCREATIC ALLOGRAFT;  Surgeon: Leona Carry, MD;  Location: MAIN OR [facility];  Service: Transplant    PR TRANSPLANT,PREP CADAVER RENAL GRAFT N/A 05/03/2019    Procedure: [facility] STD PREP CAD DONR RENAL ALLOGFT PRIOR TO TRNSPLNT, INCL DISSEC/REM PERINEPH FAT, DIAPH/RTPER ATTAC;  Surgeon: Leona Carry, MD;  Location: MAIN OR [facility];  Service: Transplant    PR TRANSPLANT,PREP DONOR PANCREAS N/A 05/03/2019    Procedure: BACKBENCH STANDARD PREPARATION OF CADAVER DONOR PANCREAS ALLOGRAFT PRIOR TO TRANSPLANTATION;  Surgeon: Leona Carry, MD;  Location: MAIN OR [facility];  Service: Transplant    PR TRANSPLANTATION OF KIDNEY N/A 05/03/2019    Procedure: RENAL ALLOTRANSPLANTATION, IMPLANTATION OF GRAFT; WITHOUT RECIPIENT NEPHRECTOMY;  Surgeon: Leona Carry, MD;  Location: MAIN OR [place];  Service: Transplant    TONSILLECTOMY         Social History     Tobacco Use    Smoking status: Never    Smokeless tobacco: Never   Vaping Use    Vaping status: Never Used   Substance Use Topics    Alcohol use: Never    Drug use: Never       Family History   Problem Relation Age of Onset    Diabetes Mother     Diabetes Father     Cancer Maternal Grandmother     Diabetes Maternal Grandfather     Diabetes Paternal Grandmother        Current Outpatient Medications   Medication Sig Dispense Refill    acetaminophen (TYLENOL) 325 MG tablet Take 2 tablets (650 mg total) by mouth every four (4) hours as needed for pain. 100 tablet 2    azathioprine (IMURAN) 50 mg tablet Take 2 tablets (100 mg total) by mouth daily 60 tablet 11    azithromycin (ZITHROMAX) 250 MG tablet Take 2 tablets (500 mg total) by mouth daily for 1 day, THEN 1 tablet (250 mg total) daily for 12 days. 14 tablet  0    butalbital-acetaminophen-caffeine (ESGIC) 50-325-40 mg per tablet Take 1 tablet by mouth every four (4) hours as needed for pain. 30 tablet 2    escitalopram oxalate (LEXAPRO) 5 MG tablet Take 1 tablet (5 mg total) by mouth daily. 90 tablet 3    sodium bicarbonate 650 mg tablet Take 2 tablets (1,300 mg total) by mouth Three (3) times a day. 180 tablet 3    tacrolimus (ENVARSUS XR) 1 mg Tb24 extended release tablet Take 2 tablets (2 mg total) by mouth daily with FOUR 4mg  tablets for total daily dose of 18mg  60 tablet 0    tacrolimus (ENVARSUS XR) 4 mg Tb24 extended release tablet Take 4 tablets (16 mg total) by mouth daily. Take with two 1 mg tablets for a total of 18 mg 120 tablet 11     No current facility-administered medications for this visit.       Allergies   Allergen Reactions    Iodinated Contrast Media Swelling, Rash and Other (See Comments)     Burning, warmth throughout body and tingling.  Throat swelling.  Treated with benadryl and symptoms improved.  Has not had contrast since then (2013 or 2014).    Burning    Nickel Rash    Privigen [Immun Glob G(Igg)-Pro-Iga 0-50] Muscle Pain     PRIVIGEN brand: back and leg pain at usual rate of infusion during first dose, then full body pain again when rechallenged at slower rate of 30 mL/hr    Propranolol Other (See Comments) and Swelling    Eye Irrigating Solution [Ophthalmic Irrigation Solution] Other (See Comments)     Contrast dye (name?) used in eyes caused hot feeling in face, reversed with Benadryl.     Iodine      Other reaction(s): Skin Rash    Naltrexone Rash    Uni-Cortrom Rash       REVIEW OF SYSTEMS: See HPI; remaining balance of 10 systems are negative/non-contributory.    OBJECTIVE     BP 118/64  - Pulse 73  - Temp 36.8 ??C (98.2 ??F) (Temporal)  - Ht 165.1 cm (5' 5)  - Wt 68.9 kg (152 lb)  - LMP  (LMP Unknown)  - BMI 25.29 kg/m??   General: well-appearing and in NAD  Cardiovascular: Regular rate   Pulmonary : Normal WOB    Pelvic:  normal external genitalia, BUS negative, thin white discharge in vagina and cervix are without gross lesions   Psych: pleasant and interactive, answers questions appropriately    A chaperone was present for any sensitive portions of the exam (if performed) including but not limited to, breast and pelvic examination.  If performed, consent was obtained from patient prior to the sensitive examination.  If medical students were involved, explicit additional consent was obtained.

## 2023-01-06 NOTE — Unmapped (Signed)
Thank you for receiving care at [facility]! If you have any questions or concerns you have two ways to contact our team:     1) Nurse line: [phone]     2) MyChart messages: These messages are checked by the nurses during normal business hours 8:30 am-4:30 pm Monday-Friday every 24-48 hours and are for non-urgent, non-emergent concerns. You may be asked to return for a follow up visit if it is deemed your questions are best handled in the clinic setting.

## 2023-01-07 LAB — TACROLIMUS LEVEL: TACROLIMUS BLOOD: 8.1 ng/mL

## 2023-01-08 MED ORDER — METRONIDAZOLE 0.75 % (37.5 MG/5 GRAM) VAGINAL GEL
Freq: Every day | VAGINAL | 0 refills | 5 days | Status: CP
Start: 2023-01-08 — End: 2023-01-13

## 2023-01-10 LAB — VITAMIN D 1,25 DIHYDROXY
VITAMIN D 1,25 D2: <8 pg/mL
VITAMIN D 1,25 D3: 40 pg/mL
VITAMIN D,1,25 (OH) 2, TOTAL: 40 pg/mL

## 2023-01-15 MED ORDER — METRONIDAZOLE 0.75 % (37.5 MG/5 GRAM) VAGINAL GEL
Freq: Every day | VAGINAL | 0 refills | 5 days | Status: CP
Start: 2023-01-15 — End: 2023-01-20

## 2023-01-15 NOTE — Unmapped (Signed)
Please send metrogel to pt's preferred pharmacy.  Have pt hold off on boric acid at this time. If she continues to have symptoms she may need further evaluation.     Jesse Sans, NP

## 2023-01-22 ENCOUNTER — Telehealth: Admit: 2023-01-22 | Discharge: 2023-01-23 | Payer: MEDICARE | Attending: Clinical | Primary: Clinical

## 2023-01-22 DIAGNOSIS — F4310 Post-traumatic stress disorder, unspecified: Principal | ICD-10-CM

## 2023-01-22 NOTE — Unmapped (Signed)
Confidential Psychological Therapy Session  [facility] for Transplant Care     Patient Name: Elaine Koch  Medical Record Number: [id number]  Date of Service: January 22, 2023  Clinical Psychologist: Artemio Aly, PhD  Intern: None  Time Spent: 45 min of face-to-face counseling  CPT Procedure Code: 81191 (45 min psychotherapy with patient and/or family)  Therapy Type: Behavior Modifying/Cognitive Behavioral Therapy (CBT)  Purpose of Treatment: assess current MH needs, reduce depression symptoms, reduce anxiety symptoms, improve quality of life, improve coping, adjustment to chronic medical illness, safety planning     *This patient was not seen in person to minimize potential spread of COVID-19, protect patients/family/providers, and reduce PPE utilization. During this time, transplant psychology will be limiting person-to-person contact when possible.*       The patient reports they are physically located in [place] and is currently: not at home (at work, at [employer]). I conducted a audio/video visit. I spent  32m 12s on the video call with the patient. I spent an additional 30 minutes on pre- and post-visit activities on the date of service . Neither pt nor provider were located on-site.      The limits of confidentiality and the purpose of the evaluation were reviewed. The patient was provided with a verbal description of the nature and purpose of the psychological evaluation. I also reviewed the referral source, specific referral question for this evaluation, foreseeable risks/discomforts, benefits, limits of confidentiality, and mandatory reporting requirements of this provider. The patient was given the opportunity to ask questions and receive answers about the present evaluation. Oral consent was provided by the patient.      This evaluation note may contain sensitive and confidential information regarding the patient???s psychosocial adjustment to living with a chronic medical condition. DO NOT share this information outside [facility] without written consent from the patient explicitly stating that mental health records may be released.      Referral/Relevant History:  Ms.  Koch is a very pleasant 37 y.o.  female who presents for cognitive behavioral therapy to clarify current MH needs and address symptoms of depression/anxiety that were recently reported during her most recent post-transplant nephrology follow-up with Dr. Lucienne Minks True on 12/13/22, primarily in context of recent pregnancy loss. Elaine Koch has previously been referred for similar concerns in April 2024 and had been working with Clinical research associate until July 2024 (missed an appt in August after starting new job).  Ms. Sanfratello is s/p kidney/pancreas transplant on 05/03/19.      Elaine Koch met with writer initially on 07/12/22, when she endorsed symptoms consistent with PTSD, MDD Recurrent, and panic attacks, as well as frequent passive SI. She was engaged with a PHP through [facility] which had been beneficial, but as this was only a 21-day program, Clinical research associate agreed to continue to follow her for support until she is able to connect with a consistent, long-term provider; this nearly happened at [facility] but she was unable to attend that appt and never established outpatient MH care. She was seen today for her 6th appt with Clinical research associate.     Review of Symptoms/ROS: Deferred     Subjective:   Elaine Koch reported that the past few weeks have been hard and that she feels like symptoms of anxiety and depression have been elevated. She described not feeling like herself, as despite feeling well physically, she has been very fatigued and sleeping more. She attributes this to anhedonia and having limited motivation to get out of  bed. 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 She described jumping to conclusions about things (e.g. is her boyfriend being unfaithful because he isn't talking to her as much) and talked about how she has a history of forming unhealthy bonds with people (e.g. becomes attached to someone when they show her attention). She admitted to having one passive suicidal thought in the past several weeks, but adamantly denied plans and just went to sleep to cope with it.    Time was spent today providing psychoeducation about behavioral activation and how avoidance can be negatively reinforced. We also utilized an Warehouse manager of driving the car, even if emotions remain in the car, and prioritizing values-based behavior even when feeling sad or anxious. We also talked about how to reduce avoidance behaviors, including enlisting social support, identifying meaningful activities to focus on, and breaking activities down into smaller parts to avoid feeling overwhelmed. [name] identified several goals for the coming weeks, including spending more time with family and waking up on Sunday morning to attend church. We also engaged in treatment planning and Ms. Mackins was given information on the Women's Mood Disorders clinic through [facility], as MH symptoms acutely worsened following pregnancy loss; she also has questions about potentially increasing the dose of her Lexapro.      Objective:   Mental Status Exam:  Appearance: Appears stated age and Clean/Neat.  Motor: No abnormal movements  Speech/Language: Normal rate, volume, tone, fluency  Mood: Depressed and Anxious  Affect: Anxious, Depressed, and Mood congruent  Thought Process: Logical, linear, clear, coherent, goal directed  Thought Content: Suicidal Ideation, passive, denies plan/intent; denies HI or A/VH  Perceptual Disturbances: Denies auditory and visual hallucinations, behavior not concerning for response to internal stimuli  Orientation: Oriented to person, place, time, and general circumstances  Attention: Able to fully attend without fluctuations in consciousness  Concentration: Able to fully concentrate and attend  Memory: Immediate, short-term, long-term, and recall grossly intact  Fund of Knowledge: Consistent with level of education and development  Insight: Fair  Judgment: Fair  Impulse Control: Fair        Assessment:  Ms.  Saracino participated well in this CBT session and exhibits good motivation towards treatment goals. Today she reported ongoing symptoms of depression and anxiety, more so depression recently following a pregnancy loss. While objective measures were not done today, she reported that her depression is better than it was when she was engaged in a PHP earlier this year but worse than prior to pregnancy loss. She reported that she has continued to use some effective coping strategies, like PMR and diaphragmatic breathing, but over the past few weeks has dealt with more anhedonia and has been using avoidance coping (e.g. stay in bed, sleep). Today she also endorsed a history of some interpersonal difficulties, including strong emotional reactions (both positively and negatively) to people in a short period of time; rule out borderline personality disorder traits.      Taken altogether, Ms. Hooton appears to be experiencing moderate symptoms of depression and anxiety at this time, though with good engagement in using CBT-oriented skills, she seems to be utilizing more effective coping strategies lately and has reported some improvements in mood compared to earlier this year. She would benefit from consistent, active engagement in [facility] care to treat these symptoms, but work schedule and  finances are barriers to this. She was amenable to monthly check-ins with Clinical research associate for now, and we will continue to explore other treatment options. Today, information was provided to her about [facility] Mood Disorders clinic and she was given information for how to contact them to schedule an appointment.      Focus on current treatment is assessing current MH needs, safety planning, treatment planning.      Focus on future sessions will include ongoing assessment and safety planning, introducing and practicing CBT-oriented coping strategies.     Adherence concerns: None reported today, though previous nephrology notes suggest some issues with obtaining labs consistently and disjointed care (more ED visits than outpatient nephrology follow-up).     Diagnostic Impression: Posttraumatic Stress Disorder; Major Depressive Disorder, Severe, Recurrent; Unspecified Anxiety Disorder (panic attacks); R/O Bipolar Disorder        Risk Assessment:  A suicide and violence risk assessment was performed as part of this evaluation. The patient is deemed to be at chronic elevated risk for self-harm/suicide given the following factors: recent bereavement, recent trauma, current diagnosis of depression, hopelessness, previous acts of self-harm, suicidal ideation or threats without a plan, chronic severe medical condition, chronic mental illness > 5 years, and past diagnosis of depression. The patient is deemed to be at chronic elevated risk for violence given the following factors: recent loss. These risk factors are mitigated by the following factors: no known access to weapons or firearms, no history of violence, motivation for treatment, supportive family, sense of responsibility to family and social supports, minor children living at home, presence of an available support system, employment or functioning in a structured work/academic setting, religious or spiritual prohibition to suicide/violence, and safe housing. There is no acute risk for suicide or violence at this time. The patient was educated about relevant modifiable risk factors including following recommendations for treatment of psychiatric illness and abstaining from substance abuse.               While future psychiatric events cannot be accurately predicted, the patient does not currently require  acute inpatient psychiatric care and does not currently meet [facility] involuntary commitment criteria.               Psychometric Testing: None administered today.          Plan:  Writer will attempt to meet with Ms. Bob as a bridge until she can establish consistent MH care, though work and finances have been a barrier to this and she was lost to follow-up with this Clinical research associate previously. Specific skills she was encouraged to practice include engagement in values-based behavior (e.g. spending more time with family, attending church).      Ms.  Polkinghorne will return Wednesday 02/19/23 at 12:00pm via video.       Ms.  Urena was given this writer's contact information with confidential voice mail number and instructed to call 911 for emergencies.

## 2023-01-27 DIAGNOSIS — Z79899 Other long term (current) drug therapy: Principal | ICD-10-CM

## 2023-01-27 DIAGNOSIS — Z940 Kidney transplant status: Principal | ICD-10-CM

## 2023-01-29 NOTE — Unmapped (Signed)
call from pt. this morning- reporting restless ness, migraine headache, leg pain since yesterday. Taken Tylenol and Excedrin migraine, no relief. Temp 99.7, no N/V/D. Just feels awful. Taking meds, keeping down, hydrating. Told her rest but she has to go to work today.  No respiratory s/s.  Notified primary coord, C. Christensen to f/u with pt this morning.

## 2023-01-29 NOTE — Unmapped (Signed)
error 

## 2023-01-29 NOTE — Unmapped (Signed)
Called patient to follow up after she paged on call nurse this am.   Left VM that patient should follow up with her PCP for further evaluation or urgent care if her symptoms persist or worsen.

## 2023-01-31 ENCOUNTER — Ambulatory Visit: Admit: 2023-01-31 | Discharge: 2023-02-03 | Disposition: A | Discharge status: Home / Self Care | Payer: MEDICARE

## 2023-01-31 LAB — COMPREHENSIVE METABOLIC PANEL
ALBUMIN: 4.3 g/dL (ref 3.4–5.0)
ALKALINE PHOSPHATASE: 52 U/L (ref 46–116)
ALT (SGPT): 25 U/L (ref 10–49)
ANION GAP: 14 mmol/L (ref 5–14)
AST (SGOT): 30 U/L (ref <=34)
BILIRUBIN TOTAL: 1.5 mg/dL — ABNORMAL HIGH (ref 0.3–1.2)
BLOOD UREA NITROGEN: 30 mg/dL — ABNORMAL HIGH (ref 9–23)
BUN / CREAT RATIO: 13
CALCIUM: 9.7 mg/dL (ref 8.7–10.4)
CHLORIDE: 101 mmol/L (ref 98–107)
CO2: 24.2 mmol/L (ref 20.0–31.0)
CREATININE: 2.24 mg/dL — ABNORMAL HIGH (ref 0.55–1.02)
EGFR CKD-EPI (2021) FEMALE: 29 mL/min/{1.73_m2} — ABNORMAL LOW (ref >=60)
GLUCOSE RANDOM: 105 mg/dL (ref 70–179)
POTASSIUM: 5 mmol/L — ABNORMAL HIGH (ref 3.4–4.8)
PROTEIN TOTAL: 8.1 g/dL (ref 5.7–8.2)
SODIUM: 139 mmol/L (ref 135–145)

## 2023-01-31 LAB — BLOOD GAS CRITICAL CARE PANEL, VENOUS
BASE EXCESS VENOUS: 1 (ref -2.0–2.0)
CALCIUM IONIZED VENOUS (MG/DL): 4.72 mg/dL (ref 4.40–5.40)
GLUCOSE WHOLE BLOOD: 116 mg/dL (ref 54–400)
HCO3 VENOUS: 25 mmol/L (ref 22–27)
HEMOGLOBIN BLOOD GAS: 7.9 g/dL — ABNORMAL LOW (ref 12.00–16.00)
LACTATE BLOOD VENOUS: 0.5 mmol/L (ref 0.5–1.8)
O2 SATURATION VENOUS: 87.3 % — ABNORMAL HIGH (ref 40.0–85.0)
PCO2 VENOUS: 36 mmHg — ABNORMAL LOW (ref 40–60)
PH VENOUS: 7.45 — ABNORMAL HIGH (ref 7.32–7.43)
PO2 VENOUS: 52 mmHg — ABNORMAL HIGH (ref 35–40)
POTASSIUM WHOLE BLOOD: 4.3 mmol/L (ref 3.4–4.6)
SODIUM WHOLE BLOOD: 136 mmol/L (ref 135–145)

## 2023-01-31 LAB — CBC W/ AUTO DIFF
BASOPHILS ABSOLUTE COUNT: 0 10*9/L (ref 0.0–0.1)
BASOPHILS RELATIVE PERCENT: 1.7 %
EOSINOPHILS ABSOLUTE COUNT: 0.2 10*9/L (ref 0.0–0.5)
EOSINOPHILS RELATIVE PERCENT: 9.2 %
HEMATOCRIT: 24.2 % — ABNORMAL LOW (ref 34.0–44.0)
HEMOGLOBIN: 8.3 g/dL — ABNORMAL LOW (ref 11.3–14.9)
LYMPHOCYTES ABSOLUTE COUNT: 0.4 10*9/L — ABNORMAL LOW (ref 1.1–3.6)
LYMPHOCYTES RELATIVE PERCENT: 19.3 %
MEAN CORPUSCULAR HEMOGLOBIN CONC: 34.2 g/dL (ref 32.0–36.0)
MEAN CORPUSCULAR HEMOGLOBIN: 36.9 pg — ABNORMAL HIGH (ref 25.9–32.4)
MEAN CORPUSCULAR VOLUME: 107.9 fL — ABNORMAL HIGH (ref 77.6–95.7)
MEAN PLATELET VOLUME: 9.4 fL (ref 6.8–10.7)
MONOCYTES ABSOLUTE COUNT: 0.2 10*9/L — ABNORMAL LOW (ref 0.3–0.8)
MONOCYTES RELATIVE PERCENT: 11.4 %
NEUTROPHILS ABSOLUTE COUNT: 1.2 10*9/L — ABNORMAL LOW (ref 1.8–7.8)
NEUTROPHILS RELATIVE PERCENT: 58.4 %
NUCLEATED RED BLOOD CELLS: 1 /100{WBCs} (ref <=4)
PLATELET COUNT: 182 10*9/L (ref 150–450)
RED BLOOD CELL COUNT: 2.25 10*12/L — ABNORMAL LOW (ref 3.95–5.13)
RED CELL DISTRIBUTION WIDTH: 20.1 % — ABNORMAL HIGH (ref 12.2–15.2)
WBC ADJUSTED: 2.1 10*9/L — ABNORMAL LOW (ref 3.6–11.2)

## 2023-01-31 LAB — URINALYSIS WITH MICROSCOPY WITH CULTURE REFLEX PERFORMABLE
BACTERIA: NONE SEEN /HPF
BILIRUBIN UA: NEGATIVE
BLOOD UA: NEGATIVE
GLUCOSE UA: NEGATIVE
KETONES UA: NEGATIVE
NITRITE UA: NEGATIVE
PH UA: 6.5 (ref 5.0–9.0)
RBC UA: 1 /HPF (ref <=4)
SPECIFIC GRAVITY UA: 1.012 (ref 1.003–1.030)
SQUAMOUS EPITHELIAL: 1 /HPF (ref 0–5)
UROBILINOGEN UA: 3 — AB
WBC UA: 38 /HPF — ABNORMAL HIGH (ref 0–5)

## 2023-01-31 LAB — PREGNANCY, URINE: PREGNANCY TEST URINE: NEGATIVE

## 2023-01-31 LAB — SLIDE REVIEW

## 2023-01-31 MED ADMIN — acetaminophen (TYLENOL) tablet 650 mg: 650 mg | ORAL | Stop: 2023-01-31

## 2023-02-01 LAB — BASIC METABOLIC PANEL
ANION GAP: 13 mmol/L (ref 5–14)
BLOOD UREA NITROGEN: 30 mg/dL — ABNORMAL HIGH (ref 9–23)
BUN / CREAT RATIO: 13
CALCIUM: 9.4 mg/dL (ref 8.7–10.4)
CHLORIDE: 106 mmol/L (ref 98–107)
CO2: 22.9 mmol/L (ref 20.0–31.0)
CREATININE: 2.31 mg/dL — ABNORMAL HIGH (ref 0.55–1.02)
EGFR CKD-EPI (2021) FEMALE: 28 mL/min/{1.73_m2} — ABNORMAL LOW (ref >=60)
GLUCOSE RANDOM: 87 mg/dL (ref 70–179)
POTASSIUM: 4.9 mmol/L (ref 3.5–5.1)
SODIUM: 142 mmol/L (ref 135–145)

## 2023-02-01 LAB — CBC
HEMATOCRIT: 21 % — ABNORMAL LOW (ref 34.0–44.0)
HEMOGLOBIN: 7.2 g/dL — ABNORMAL LOW (ref 11.3–14.9)
MEAN CORPUSCULAR HEMOGLOBIN CONC: 34.3 g/dL (ref 32.0–36.0)
MEAN CORPUSCULAR HEMOGLOBIN: 37 pg — ABNORMAL HIGH (ref 25.9–32.4)
MEAN CORPUSCULAR VOLUME: 108 fL — ABNORMAL HIGH (ref 77.6–95.7)
MEAN PLATELET VOLUME: 9.2 fL (ref 6.8–10.7)
PLATELET COUNT: 158 10*9/L (ref 150–450)
RED BLOOD CELL COUNT: 1.94 10*12/L — ABNORMAL LOW (ref 3.95–5.13)
RED CELL DISTRIBUTION WIDTH: 19.9 % — ABNORMAL HIGH (ref 12.2–15.2)
WBC ADJUSTED: 1.6 10*9/L — ABNORMAL LOW (ref 3.6–11.2)

## 2023-02-01 MED ADMIN — azathioprine (IMURAN) tablet 100 mg: 100 mg | ORAL | @ 14:00:00

## 2023-02-01 MED ADMIN — sodium bicarbonate tablet 1,300 mg: 1300 mg | ORAL | @ 19:00:00

## 2023-02-01 MED ADMIN — sodium bicarbonate tablet 1,300 mg: 1300 mg | ORAL | @ 14:00:00

## 2023-02-01 MED ADMIN — tacrolimus (ENVARSUS XR) extended release tablet 2 mg: 2 mg | ORAL | @ 14:00:00

## 2023-02-01 MED ADMIN — cefTRIAXone (ROCEPHIN) 1 g in sodium chloride 0.9 % (NS) 100 mL IVPB-MBP: 1 g | INTRAVENOUS | @ 06:00:00 | Stop: 2023-02-01

## 2023-02-01 MED ADMIN — metroNIDAZOLE (FLAGYL) tablet 500 mg: 500 mg | ORAL | @ 14:00:00 | Stop: 2023-02-08

## 2023-02-01 MED ADMIN — heparin (porcine) 5,000 unit/mL injection 5,000 Units: 5000 [IU] | SUBCUTANEOUS | @ 11:00:00

## 2023-02-01 MED ADMIN — tacrolimus (ENVARSUS XR) extended release tablet 16 mg: 16 mg | ORAL | @ 14:00:00

## 2023-02-01 MED ADMIN — metroNIDAZOLE (FLAGYL) tablet 500 mg: 500 mg | ORAL | @ 07:00:00 | Stop: 2023-02-01

## 2023-02-01 MED ADMIN — heparin (porcine) 5,000 unit/mL injection 5,000 Units: 5000 [IU] | SUBCUTANEOUS | @ 19:00:00

## 2023-02-01 MED ADMIN — escitalopram oxalate (LEXAPRO) tablet 5 mg: 5 mg | ORAL | @ 14:00:00

## 2023-02-01 MED ADMIN — sodium chloride 0.9% (NS) bolus 1,000 mL: 1000 mL | INTRAVENOUS | @ 06:00:00 | Stop: 2023-02-01

## 2023-02-01 NOTE — Unmapped (Addendum)
Transplant Nephrology Consult     Requesting Attending Physician :  Blima Singer, MD  Service Requesting Consult : Emergency Medicine  Reason for Consult: s/p kidney-pancreas transplant    History:    Elaine Koch is a 37 y.o. 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 On presentation, patient febrile and HDS and exam notable for suprapubic TTP, no CVA tenderness. Labs with K 5.0, Cr 2.24, and UA with small LE, trace protein, 38 WBC.    Assessment and Plan:     # S/p Deceased Donor Kidney Pancreast Transplant (05/03/2019)  # Positive DSA/history of AMR  - Serum creatinine level is 2.24. Cr 2.27 on OP labs 11/25 and per chart review Cr ranging from 1.8-2.1 since rejection. Overall, renal function appears to be stable at this time but c/f UTI.  - Cont home sodium bicarb 1300 mg TID    # Immunosuppression:  - Cont home tacrolimus (Envarsus XR) 18 mg qd  - Cont home AZA 100 mg every day (mycophenolate deferred as patient considering pregnancy)  - Prednisone free regimen  - Please obtain Tacrolimus trough levels prior to the morning dose of the medication. The tacrolimus goal level is 8-10 ng/mL.    #UTI  #Hx of BV  #URI  - Agree with IV abx  - Agree with transplant renal US to r/o obstruction or other infectious complication  - F/u infectious workup (Ucx, Bcx, STI)    # Blood Pressure / Volume:  - Normotensive    # Infectious Prophylaxis and Monitoring:   - EBV viral load undetectable (12/13/2022)  - CMV viral load undetectable (12/13/2022)  - BK viral load undetectable (12/13/2022)      RECOMMENDATIONS:   - Cont home bicarb, immunosuppression as above  - Agree with transplant renal US  - Infection workup and management as above  - Transplant patients with an open wound require wound care with sterile water only. The patient should be counseled on this at the time of discharge if they have not already been doing this.  - We will continue to follow.     Alwyn Ren, DO  02/01/2023 12:42 AM

## 2023-02-01 NOTE — Unmapped (Addendum)
Pt received to the floor from the ED for evaluation of 2 days of fever and chills, with associated cough, body aches, lower abdominal pain, discomfort with urination, and increased urination. Pt appears AOX4.Denies pain on arrival and no acute distress noted at this time.Pt oriented to room and introduced to HCT.Medication reviewed with questions answered appropriately.Fall precaution in place, call bell within reach and has been encouraged to call for help when in need.Will keep monitoring.   Problem: Adult Inpatient Plan of Care  Goal: Plan of Care Review  Outcome: Ongoing - Unchanged  Goal: Patient-Specific Goal (Individualized)  Outcome: Ongoing - Unchanged  Goal: Absence of Hospital-Acquired Illness or Injury  Outcome: Ongoing - Unchanged  Intervention: Identify and Manage Fall Risk  Recent Flowsheet Documentation  Taken 02/01/2023 0400 by Wonda Olds, RN  Safety Interventions:   bed alarm   fall reduction program maintained   lighting adjusted for tasks/safety   low bed  Taken 02/01/2023 0200 by Wonda Olds, RN  Safety Interventions:   bed alarm   fall reduction program maintained   lighting adjusted for tasks/safety   low bed  Goal: Optimal Comfort and Wellbeing  Outcome: Ongoing - Unchanged  Goal: Readiness for Transition of Care  Outcome: Ongoing - Unchanged  Goal: Rounds/Family Conference  Outcome: Ongoing - Unchanged     Problem: Pain Acute  Goal: Optimal Pain Control and Function  Outcome: Ongoing - Unchanged     Problem: Fall Injury Risk  Goal: Absence of Fall and Fall-Related Injury  Outcome: Ongoing - Unchanged  Intervention: Promote Injury-Free Environment  Recent Flowsheet Documentation  Taken 02/01/2023 0400 by Wonda Olds, RN  Safety Interventions:   bed alarm   fall reduction program maintained   lighting adjusted for tasks/safety   low bed  Taken 02/01/2023 0200 by Wonda Olds, RN  Safety Interventions:   bed alarm   fall reduction program maintained   lighting adjusted for tasks/safety   low bed

## 2023-02-01 NOTE — Unmapped (Signed)
Family Medicine Inpatient Service  Progress Note    Team: Family Medicine [place] [facility] (pgr [phone])    Hospital Day: 0    ASSESSMENT / PLAN:   Elaine Koch is a 37 y.o. female with a past medical history significant for stage III CKD, s/p dual kidney/pancreatic transplant in 04/2019 (on immunosuppressants), T1DM, sickle cell trait, and anxiety/depressio  who presents with fever, chills, cough, body aches, lower abdominal pain, dysuria, frequency.     # UTI  Patient presented with 2 days of fever, chills, cough, body aches, dysuria, and frequency. On arrival, patient hypertensive, febrile to 101.7.  UA with small leuk esterase, 38 WBC, no bacteria. Suprapubic tenderness on exam.     - CTX 1 g IV Q24H for 5 days   - Tylenol PRN   - Zofran PRN    - Transplant renal ultrasound to r/o obstruction or other infectious complication   - Follow up urine cultures, blood cultures, G/C     #Bacterial Vaginosis   Hx of 8 episodes of BV this year. Still symptomatic.  - Metronidazole 500 mg BID for 7 days , followed by metro gel 5g twice weekly for 6 months for suppression    # Recent miscarriage   D&C 11/19/2022 for missed abortion. ER presentation for abdominal pain on 12/24/22 had TVUS w/ heterogenous collection in the endometrium. Seen again 11/13 and 11/25 by OB w/ downtrending HCG, resolved vaginal bleeding, and no concern endometriosis. Denies vaginal bleeding, purulent discharge or passing any retained products. There is low concern for retained products or endometritis at this time.   - If failing improve on Abx or if urine culture is negative consider pelvic exam to assess for CMT and TVUS.      # S/p Deceased Donor Kidney Pancreast Transplant (05/03/2019)  Serum creatinine level is 2.24. Cr 2.27 on OP labs 11/25 and per chart review Cr ranging from 1.8-2.1 since rejection. Overall, renal function appears to be stable.  - Cont home sodium bicarb 1300 mg TID  - Cont home tacrolimus (Envarsus XR) 18 mg qd  - Cont home AZA 100 mg every day (mycophenolate deferred as patient considering pregnancy)  - Prednisone free regimen  - Tacrolimus trough levels ordered      #Anemia  Likely secondary to CKD and azathioprine use. Hgb stable today at 8.3. CTM with daily CBC.      #Depression - Anxiety- Continue home lexapro 5 mg.        # Checklist:  - IVF None  - Daily labs needed: CBC and BMP  - Diet Regular  - Bowel Regimen: No indication for a bowel regimen at this time  - DVT: SQ Heparin   - Code Status:   Orders Placed This Encounter   Procedures    Full Code     Standing Status:   Standing     Number of Occurrences:   1     - Dispo: Floor    [ ]  Anticipated Discharge Location: Home  [ ]  PT/OT/DME: No needs anticipated  [ ]  CM/SW needs: None anticipated  [ ]  Follow up appt: Appointment needed    SUBJECTIVE:  Interval events: Admitted overnight, feeling much better this AM, still some dysuria, and lower abdominal pain, but fever, chills, and body aches have resolved. NO purulent vaginal discharge, no vaginal bleeding. Endorses white thick vaginal discharge. Has been continuing to have unprotected sex with 1 female partner since her miscarriage.       REVIEW OF  SYSTEMS:  Pertinent positives and negatives per HPI. A complete review of systems otherwise negative.    PHYSICAL EXAM:      Intake/Output Summary (Last 24 hours) at 02/01/2023 0610  Last data filed at 02/01/2023 0130  Gross per 24 hour   Intake 1000 ml   Output --   Net 1000 ml       Recent Vitals:  Vitals:    02/01/23 0155   BP: 110/67   Pulse: 66   Resp: 16   Temp: 36.7 ??C (98.1 ??F)   SpO2: 100%       GEN: well appearing, lying in bed, NAD   HEENT: NCAT, No scleral icterus. Conjunctiva non-erythematous. MMM.  CV: Regular rate and rhythm. Flow murmur present No rubs/gallops.  Pulm: Normal work of breathing on RA. CTAB. No wheezing, crackles, or rhonchi.  Abd: Flat.  Tender in the suprapubic and LLQ No guarding, rebound.     Neuro: Alert No focal deficits.  Ext: No peripheral edema. Palpable distal pulses.  Skin: No rashes or skin lesions.       LABS/ STUDIES:    All imaging, laboratory studies, and other pertinent tests including electrocardiography within the last 24 hours were reviewed and are summarized within the assessment and plan.     NUTRITION:                             [name], MD, MD PGY3  February 01, 2023 6:10 AM

## 2023-02-01 NOTE — Unmapped (Signed)
[facility]  Emergency Department Provider Note     ED Clinical Impression     Final diagnoses:   Urinary tract infection without hematuria, site unspecified (Primary)      Impression, Medical Decision Making, ED Course     Impression: 37 y.o. female with history of stage III CKD, s/p dual kidney/pancreatic transplant in 04/2019 (on immunosuppressants), T1DM, sickle cell trait, and anxiety/depression who presents for 2 days of fever and chills, with associated cough, body aches, lower abdominal pain, discomfort with urination, and increased urination as described below.    Vital signs are notable for fever with temperature to 101.25F. On exam, the patient is in no acute distress. Remarkable for suprapubic TTP. No rebound or guarding. No CVA tenderness. Lungs CTAB.    Initial differential includes but is not limited to UTI, bacteremia, viral illness, pneumonia, amongst multiple etiologies.  Patient's vitals notable for fever 101.7 ??F.  No tachycardia, hypotension, tachypnea or hypoxia.  Therefore, sepsis workup was initiated in triage including blood cultures and labs.  CBC with WBC of 2.1, stable hemoglobin 8.3, normal platelets.  CMP with potassium of 5, creatinine of 2.24 which appears to be similar to baseline, reassuring LFTs.  UA does show small leukocyte esterase, negative nitrite, and 38 WBC which is concerning for a UTI given that she is having symptoms.  Will also do viral swabs as well as a chest x-ray.    Patient is not hypotensive here so we will hold off on the full 30 cc/kg fluid bolus.  Will treat with 1 L of IV fluids.  Also treat her UTI with ceftriaxone.  Given her immunocompromise state, will plan to admit the patient.    ED Course as of 02/01/23 0530   Sat Feb 01, 2023   0015 Spoke with family medicine team who is requesting nephrology consult to ensure that they do not have any other concerns.   41 Spoke with nephrology who states that her creatinine appears stable.  There is recommending a renal transplant ultrasound and to watch her creatinine.  They do not feel like she needs to be admitted at the main campus.   0044 Chest x-ray independent review myself and does not show any large focal consolidation or pneumothorax.   0059 Bacterial Vaginitis(!): Positive  Will plan to treat with Flagyl.   0100 Yeast Screen: No Budding Yeast   0100 Trichomonas Scrn: Negative        History     Chief Complaint  Chief Complaint   Patient presents with    Fever       HPI   Elaine Koch is a 37 y.o. female with past medical history of stage III CKD, s/p dual kidney/pancreatic transplant in 04/2019 (on immunosuppressants), T1DM, sickle cell trait, and anxiety/depression who presents for evaluation of 2 days of fever and chills, with associated cough, body aches, lower abdominal pain, discomfort with urination, and increased urination. For her fevers, she has taken tylenol. Of note, the patient reports being recently placed on antibiotics for BV. Upon interview, she reports ongoing abnormal vaginal discharge. She is sexually active with 1 female partner and endorses desire for self swab. Denies rhinorrhea, hematuria, or diarrhea.     Outside Historian(s): I have obtained additional history/collateral from none.    External Records Reviewed: I have reviewed recent and relevant previous record, including: Outpatient notes - [facility] office visit 12/13/22    Past Medical History:   Diagnosis Date  Chronic hypertension during pregnancy, antepartum 07/22/2015    Overview:  Methyldopa recommended per nephrologist if needed    Diabetes mellitus type 1 (CMS-HCC)     ESRD (end stage renal disease) (CMS-HCC)     History of pre-eclampsia 10/24/2016    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       Past Surgical History:   Procedure Laterality Date    AV FISTULA PLACEMENT  2018    CESAREAN SECTION      PR COLONOSCOPY FLX DX W/COLLJ SPEC WHEN PFRMD N/A 02/14/2020 Procedure: COLONOSCOPY, FLEXIBLE, PROXIMAL TO SPLENIC FLEXURE; DIAGNOSTIC, W/WO COLLECTION SPECIMEN BY BRUSH OR WASH;  Surgeon: Carmon Ginsberg, MD;  [facility];  Service: Gastroenterology    PR COLONOSCOPY FLX DX W/COLLJ SPEC WHEN PFRMD N/A 06/19/2020    Procedure: COLONOSCOPY, FLEXIBLE, PROXIMAL TO SPLENIC FLEXURE; DIAGNOSTIC, W/WO COLLECTION SPECIMEN BY BRUSH OR WASH;  Surgeon: Carmon Ginsberg, MD;  Location: [facility];  Service: Gastroenterology    PR FECAL MICROBIOTA PREP INSTIL N/A 02/14/2020    Procedure: PREP W INSTILLATION FECAL MICROBIOTA, ANY METHOD;  Surgeon: Carmon Ginsberg, MD;  Location: [facility];  Service: Gastroenterology    PR INDUCED ABORTN BY DIL/EVAC N/A 03/20/2021    Procedure: DILATION AND EVACUATION - INDUCED;  Surgeon: Gaynelle Cage, MD;  Location: [place] [facility];  Service: Family Planning    PR LAP,DIAGNOSTIC ABDOMEN N/A 11/17/2020    Procedure: DIAGNOSTIC AND OR OPERATIVE LAPAROSCOPY;  Surgeon: Estil Daft, MD;  Location: MAIN OR [facility];  Service: Womens Primary Gynecology    PR PREPARE FECAL MICROBIOTA FOR INSTILLATION  06/19/2020    Procedure: PREP FECAL MICROBIOTA FOR INSTILLATION, INCLUDING ASSESSMENT OF DONOR SPECIMEN;  Surgeon: Carmon Ginsberg, MD;  Location: [facility];  Service: Gastroenterology    PR SURG RX MISSED ABORTN,1ST TRI N/A 11/17/2020    Procedure: VACUUM ASPIRATION;  Surgeon: Estil Daft, MD;  Location: MAIN OR [facility];  Service: [facility] Primary Gynecology    PR SURG RX MISSED ABORTN,1ST TRI N/A 11/19/2022    Procedure: TREATMENT OF MISSED ABORTION, COMPLETED SURGICALLY; FIRST TRIMESTER;  Surgeon: Asencion Partridge, MD;  Location: [facility];  Service: [facility] Primary Gynecology    PR TRANSPLANT ALLOGRAFT PANCREAS N/A 05/03/2019    Procedure: TRANSPLANTATION OF PANCREATIC ALLOGRAFT;  Surgeon: Leona Carry, MD;  Location: MAIN OR [facility]; Service: Transplant    PR TRANSPLANT,PREP CADAVER RENAL GRAFT N/A 05/03/2019    Procedure: [facility] STD PREP CAD DONR RENAL ALLOGFT PRIOR TO TRNSPLNT, INCL DISSEC/REM PERINEPH FAT, DIAPH/RTPER ATTAC;  Surgeon: Leona Carry, MD;  Location: MAIN OR [facility];  Service: Transplant    PR TRANSPLANT,PREP DONOR PANCREAS N/A 05/03/2019    Procedure: BACKBENCH STANDARD PREPARATION OF CADAVER DONOR PANCREAS ALLOGRAFT PRIOR TO TRANSPLANTATION;  Surgeon: Leona Carry, MD;  Location: MAIN OR [place];  Service: Transplant    PR TRANSPLANTATION OF KIDNEY N/A 05/03/2019    Procedure: RENAL ALLOTRANSPLANTATION, IMPLANTATION OF GRAFT; WITHOUT RECIPIENT NEPHRECTOMY;  Surgeon: Leona Carry, MD;  Location: MAIN OR [place];  Service: Transplant    TONSILLECTOMY         Allergies  Iodinated contrast media, Nickel, Privigen [immun glob g(igg)-pro-iga 0-50], Propranolol, Eye irrigating solution [ophthalmic irrigation solution], Iodine, Naltrexone, and Uni-cortrom    Family History  Family History   Problem Relation Age of Onset    Diabetes Mother     Diabetes Father  Cancer Maternal Grandmother     Diabetes Maternal Grandfather     Diabetes Paternal Grandmother        Social History  Social History     Tobacco Use    Smoking status: Never    Smokeless tobacco: Never   Vaping Use    Vaping status: Never Used   Substance Use Topics    Alcohol use: Never    Drug use: Never        Physical Exam     VITAL SIGNS:      Vitals:    02/01/23 0144 02/01/23 0145 02/01/23 0146 02/01/23 0155   BP:    110/67   Pulse:  86  66   Resp:  19  16   Temp:   37.2 ??C (99 ??F) 36.7 ??C (98.1 ??F)   TempSrc:   Oral Oral   SpO2: 100%   100%   Weight:    66.8 kg (147 lb 4.8 oz)   Height:    165.1 cm (5' 5)     Constitutional: Alert and oriented. No acute distress.  Eyes: Conjunctivae are normal.  HEENT: Normocephalic and atraumatic. Conjunctivae clear. No congestion. Moist mucous membranes.   Cardiovascular: Rate as above, regular rhythm. Normal and symmetric distal pulses. Brisk capillary refill. Normal skin turgor.  Respiratory: Normal respiratory effort. Breath sounds are normal. There are no wheezing or crackles heard.  Gastrointestinal: Suprapubic TTP. No rebound or guarding. No CVA tenderness.  Genitourinary: Deferred.  Musculoskeletal: Non-tender with normal range of motion in all extremities.  Neurologic: Normal speech and language. No gross focal neurologic deficits are appreciated. Patient is moving all extremities equally, face is symmetric at rest and with speech.  Skin: Skin is warm, dry and intact. No rash noted.  Psychiatric: Mood and affect are normal. Speech and behavior are normal.     Radiology     XR Chest 2 views   Final Result   No radiographic evidence of acute cardiopulmonary pathology.         US Renal Transplant W Doppler    (Results Pending)       Pertinent labs & imaging results that were available during my care of the patient were independently interpreted by me and considered in my medical decision making (see chart for details).    Portions of this record have been created using Scientist, clinical (histocompatibility and immunogenetics). Dictation errors have been sought, but may not have been identified and corrected.    Documentation assistance was provided by Schuyler Amor, Scribe on January 31, 2023 at 11:18 PM for Francis Dowse, MD.    Documentation assistance provided by the above mentioned scribe. I was present during the time the encounter was recorded. The information recorded by the scribe was done at my direction and has been reviewed and validated by me.            Francis Dowse, MD  Resident  02/01/23 [id number]

## 2023-02-01 NOTE — Unmapped (Signed)
Family Medicine Inpatient Service  History and Physical Note    Team: [facility] (pgr [phone])    PCP: Pcp, None Per Patient  Date of Admission: February 01, 2023  Code Status: full code  Emergency Contact: Hicks,Dorothy [phone]    ASSESSMENT / PLAN:   Elaine Koch is a 37 y.o. female with a past medical history significant for history of stage III CKD, s/p dual kidney/pancreatic transplant in 04/2019 (on immunosuppressants), T1DM, sickle cell trait, and anxiety/depression who presents with 2 days of fever and chills, with associated cough, body aches, lower abdominal pain, dysuria, and frequency.     # UTI  Patient presenting with 2 days of fever, chills, cough, body aches, dysuria, and frequency. On arrival, patient hypertensive, febrile to 101.7.  CBC with chronic leukopenia, chronic stable anemia. CMP with K of 5, Cr 2.24. VBG with mild respiratory alkalosis. UA with small leuk esterase, 38 WBC, no bacteria. Concern for UTI given dysuria/frequency, UA, and abdominal tenderness on exam.  Given presentation with fever and immunocompromised status, will plan to administer IV antibiotics and monitor for clinical improvement.    - CTX 1 g IV Q24H for 5 days   - Tylenol PRN   - Zofran PRN    - Transplant renal ultrasound to r/o obstruction or other infectious complication   - Follow up urine cultures, blood cultures, G/C    #Bacterial Vaginosis   Patient presenting with whitish, abnormal foul smelling discharge. D&C 11/19/2022 for missed abortion. Went to ED 11/12 for vaginal discharge/bleeding and tested positive for BV, but there was concern for retained products of conception/endometritis. TVUS at that time showed no IUP, with complex heterogenous mass in the endometrium, possible hemorrhage or retained products of conceptio and was given CTX, flagyl, and azithromycin. Seen by OBGYN 11/25 with continued vaginal discharge without itching, burning, or discomfort. Was treated for BV 11/27, then developed a yeast infection and was treated with over the counter medicine. Wet prep in ED positive for BV.  Patient endorses a history of recurrent BV, RX records showing 8 flagyl prescriptions in the last year.   - Metronidazole 500 mg BID for 7 days      # S/p Deceased Donor Kidney Pancreast Transplant (05/03/2019)  Serum creatinine level is 2.24. Cr 2.27 on OP labs 11/25 and per chart review Cr ranging from 1.8-2.1 since rejection. Overall, renal function appears to be stable.  - Cont home sodium bicarb 1300 mg TID  - Cont home tacrolimus (Envarsus XR) 18 mg qd  - Cont home AZA 100 mg every day (mycophenolate deferred as patient considering pregnancy)  - Prednisone free regimen  - Tacrolimus trough levels ordered     #Anemia  Likely secondary to CKD and azathioprine use. Hgb stable today at 8.3. CTM with daily CBC.     #Depression - Anxiety- Continue home lexapro 5 mg.      # FEN/GI:  - IVF None  - Check electrolytes as indicated, replete as needed.  - Diet Regular    # PPX:   - DVT: SQ Heparin     # Dispo: Floor  [ ]  Anticipated Discharge Location: Home  [ ]  PT/OT/DME: No needs anticipated  [ ]  CM/SW needs: None anticipated  [ ]  Meds/Rx:  Not yet prescribed. No special med needs  [ ]  Teaching: None anticipated  [ ]  Follow up appt: Appointment needed  [ ]  Excuse letter: None anticipated  [ ]  Transport: Private Needed  HISTORY OF PRESENT ILLNESS:  Elaine Koch is a 37 y.o. female who presents with 2 days of fever, body aches, lower abdominal pain, dysuria, frequency, and abnormal vaginal discharge. Additionally she reports cough, nausea, and vomiting. She described the discharge as whiteish and foul smelling. She was recently treated for BV in early December, then developed a yeast infection and was treated with over the counter monistat. She reports a history of recurrent BV in the past.  She denies chest pain, lightheadedness, dizziness, diarrhea, or vaginal bleeding. LMP was 11/28. She is sexually active with one female partner. She has been able to drink fluids but reports poor appetite for the last couple of days.     ED Course:   Patient presenting febrile to 101.7, mildly hypertensive, normal RR and HR. CBC with chronic leukopenia, chronic stable anemia. CMP with K of 5, Cr 2.24. VBG with mild respiratory alkalosis. UA with small leuk esterase, 38 WBC. Wet prep positive for BV. CXR with no acute abnormality.  Received 1L bolus, started on CTX and Flagyl     COVID-19 Universal Testing on Admission (if asymptomatic, does not need repeat if done with the past 7 days): Symptomatic & Negative    COVID-19 Vaccine: Already completed    PAST MEDICAL / SURGICAL HX:  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     Past Surgical History:   Procedure Laterality Date    AV FISTULA PLACEMENT  2018    CESAREAN SECTION      PR COLONOSCOPY FLX DX W/COLLJ SPEC WHEN PFRMD N/A 02/14/2020    Procedure: COLONOSCOPY, FLEXIBLE, PROXIMAL TO SPLENIC FLEXURE; DIAGNOSTIC, W/WO COLLECTION SPECIMEN BY BRUSH OR WASH;  Surgeon: Carmon Ginsberg, MD;  [facility]: [facility];  Service: Gastroenterology    PR COLONOSCOPY FLX DX W/COLLJ SPEC WHEN PFRMD N/A 06/19/2020    Procedure: COLONOSCOPY, FLEXIBLE, PROXIMAL TO SPLENIC FLEXURE; DIAGNOSTIC, W/WO COLLECTION SPECIMEN BY BRUSH OR WASH;  Surgeon: Carmon Ginsberg, MD;  [facility]: [facility];  Service: Gastroenterology    PR FECAL MICROBIOTA PREP INSTIL N/A 02/14/2020    Procedure: PREP W INSTILLATION FECAL MICROBIOTA, ANY METHOD;  Surgeon: Carmon Ginsberg, MD;  Location: [facility];  Service: Gastroenterology    PR INDUCED ABORTN BY DIL/EVAC N/A 03/20/2021    Procedure: DILATION AND EVACUATION - INDUCED;  Surgeon: Gaynelle Cage, MD;  Location: [facility] OR [facility];  Service: Family Planning    PR LAP,DIAGNOSTIC ABDOMEN N/A 11/17/2020    Procedure: DIAGNOSTIC AND OR OPERATIVE LAPAROSCOPY;  Surgeon: Estil Daft, MD;  Location: MAIN OR [facility];  Service: Womens Primary Gynecology    PR PREPARE FECAL MICROBIOTA FOR INSTILLATION  06/19/2020    Procedure: PREP FECAL MICROBIOTA FOR INSTILLATION, INCLUDING ASSESSMENT OF DONOR SPECIMEN;  Surgeon: Carmon Ginsberg, MD;  Location: [facility];  Service: Gastroenterology    PR SURG RX MISSED ABORTN,1ST TRI N/A 11/17/2020    Procedure: VACUUM ASPIRATION;  Surgeon: Estil Daft, MD;  Location: MAIN OR [facility] [facility];  Service: [facility] Primary Gynecology    PR SURG RX MISSED ABORTN,1ST TRI N/A 11/19/2022    Procedure: TREATMENT OF MISSED  ABORTION, COMPLETED SURGICALLY; FIRST TRIMESTER;  Surgeon: Asencion Partridge, MD;  Location: [facility] OR [facility];  Service: [facility] Primary Gynecology    PR TRANSPLANT ALLOGRAFT PANCREAS N/A 05/03/2019    Procedure: TRANSPLANTATION OF PANCREATIC ALLOGRAFT;  Surgeon: Leona Carry, MD;  Location: MAIN OR [facility];  Service: Transplant    PR TRANSPLANT,PREP CADAVER RENAL GRAFT N/A 05/03/2019    Procedure: [facility] STD PREP CAD DONR RENAL ALLOGFT PRIOR TO TRNSPLNT, INCL DISSEC/REM PERINEPH FAT, DIAPH/RTPER ATTAC;  Surgeon: Leona Carry, MD;  Location: MAIN OR [facility];  Service: Transplant    PR TRANSPLANT,PREP DONOR PANCREAS N/A 05/03/2019    Procedure: BACKBENCH STANDARD PREPARATION OF CADAVER DONOR PANCREAS ALLOGRAFT PRIOR TO TRANSPLANTATION;  Surgeon: Leona Carry, MD;  Location: MAIN OR [facility];  Service: Transplant    PR TRANSPLANTATION OF KIDNEY N/A 05/03/2019    Procedure: RENAL ALLOTRANSPLANTATION, IMPLANTATION OF GRAFT; WITHOUT RECIPIENT NEPHRECTOMY;  Surgeon: Leona Carry, MD;  Location: MAIN OR [facility];  Service: Transplant    TONSILLECTOMY         FAMILY HX:   Family History   Problem Relation Age of Onset    Diabetes Mother     Diabetes Father     Cancer Maternal Grandmother     Diabetes Maternal Grandfather     Diabetes Paternal Grandmother        SOCIAL HX:   Social History     Socioeconomic History    Marital status: Single     Spouse name: None    Number of children: 1    Years of education: None    Highest education level: Some college, no degree   Occupational History    Occupation: patient registration   Tobacco Use    Smoking status: Never    Smokeless tobacco: Never   Vaping Use    Vaping status: Never Used   Substance and Sexual Activity    Alcohol use: Never    Drug use: Never    Sexual activity: Not Currently     Partners: Male     Social Drivers of Health     Financial Resource Strain: Low Risk  (08/16/2022)    Overall Financial Resource Strain (CARDIA)     Difficulty of Paying Living Expenses: Not very hard   Food Insecurity: No Food Insecurity (08/16/2022)    Hunger Vital Sign     Worried About Running Out of Food in the Last Year: Never true     Ran Out of Food in the Last Year: Never true   Transportation Needs: No Transportation Needs (08/16/2022)    PRAPARE - Therapist, art (Medical): No     Lack of Transportation (Non-Medical): No   Physical Activity: Insufficiently Active (04/20/2019)    Exercise Vital Sign     Days of Exercise per Week: 2 days     Minutes of Exercise per Session: 30 min   Stress: No Stress Concern Present (04/20/2019)    [employer] of Occupational Health - Occupational Stress Questionnaire     Feeling of Stress : Only a little   Social Connections: Unknown (04/20/2019)    Social Connection and Isolation Panel [NHANES]     Frequency of Communication with Friends and Family: Patient declined     Frequency of Social Gatherings with Friends and Family: Patient declined     Attends Religious Services: Patient declined     Active Member of Clubs or Organizations: Patient declined     Attends  Banker Meetings: Patient declined     Marital Status: Patient declined       MEDICATIONS / ALLERGIES:  Medications Prior to Admission   Medication Sig Dispense Refill Last Dose/Taking    acetaminophen (TYLENOL) 325 MG tablet Take 2 tablets (650 mg total) by mouth every four (4) hours as needed for pain. 100 tablet 2     azathioprine (IMURAN) 50 mg tablet Take 2 tablets (100 mg total) by mouth daily 60 tablet 11     [EXPIRED] azithromycin (ZITHROMAX) 250 MG tablet Take 2 tablets (500 mg total) by mouth daily for 1 day, THEN 1 tablet (250 mg total) daily for 12 days. 14 tablet 0     butalbital-acetaminophen-caffeine (ESGIC) 50-325-40 mg per tablet Take 1 tablet by mouth every four (4) hours as needed for pain. 30 tablet 2     escitalopram oxalate (LEXAPRO) 5 MG tablet Take 1 tablet (5 mg total) by mouth daily. 90 tablet 3     [EXPIRED] metroNIDAZOLE (METROGEL) 0.75 % (37.5mg /5 gram) vaginal gel Insert into the vagina daily for 5 days. One full applicator intravaginally once a day for 5 days 70 g 0     sodium bicarbonate 650 mg tablet Take 2 tablets (1,300 mg total) by mouth Three (3) times a day. 180 tablet 3     tacrolimus (ENVARSUS XR) 1 mg Tb24 extended release tablet Take 2 tablets (2 mg total) by mouth daily with FOUR 4mg  tablets for total daily dose of 18mg  60 tablet 0     tacrolimus (ENVARSUS XR) 4 mg Tb24 extended release tablet Take 4 tablets (16 mg total) by mouth daily. Take with two 1 mg tablets for a total of 18 mg 120 tablet 11        Allergies   Allergen Reactions    Iodinated Contrast Media Swelling, Rash and Other (See Comments)     Burning, warmth throughout body and tingling.  Throat swelling.  Treated with benadryl and symptoms improved.  Has not had contrast since then (2013 or 2014).    Burning    Nickel Rash    Privigen [Immun Glob G(Igg)-Pro-Iga 0-50] Muscle Pain     PRIVIGEN brand: back and leg pain at usual rate of infusion during first dose, then full body pain again when rechallenged at slower rate of 30 mL/hr    Propranolol Other (See Comments) and Swelling    Eye Irrigating Solution [Ophthalmic Irrigation Solution] Other (See Comments)     Contrast dye (name?) used in eyes caused hot feeling in face, reversed with Benadryl.     Iodine      Other reaction(s): Skin Rash    Naltrexone Rash    Uni-Cortrom Rash       IMMUNIZATIONS:   Immunization History   Administered Date(s) Administered    COVID-19 VACC,MRNA,(PFIZER)(PF) 04/29/2019, 08/06/2019, 10/13/2019, 03/23/2020    DTaP, Unspecified Formulation 03/15/1988, 05/17/1988, 12/30/1988, 06/03/1992, 01/10/1998    HEPATITIS B VACCINE ADULT,IM(ENERGIX B, RECOMBIVAX) 12/10/2017    HPV Quadrivalent (Gardasil) 05/28/2012    Hepatitis B Vaccine, Unspecified Formulation 11/22/1998, 01/02/1999, 05/30/1999    HiB, unspecified 12/30/1988    Influenza Vaccine Quad(IM)6 MO-Adult(PF) 12/03/2005, 10/17/2015, 01/29/2019, 11/26/2019, 12/21/2021    Influenza Virus Vaccine, unspecified formulation 01/20/2000, 01/18/2005, 11/12/2016, 01/31/2019    MMR 12/30/1988, 06/03/1992    Meningococcal Conjugate MCV4P 05/27/2005    PNEUMOCOCCAL POLYSACCHARIDE 23-VALENT 01/13/2009, 12/20/2016    Pneumococcal Conjugate 20-valent 12/13/2022    Polio Virus Vaccine, Unspecified Formulation 01/11/1988, 03/15/1988, 12/30/1988, 06/03/1992  TD(TDVAX),ADSORBED,2LF(IM)(PF) 04/20/1999    TdaP 11/26/2005, 10/17/2015       REVIEW OF SYSTEMS:  Pertinent positives and negatives per HPI. A complete review of systems otherwise negative.    PHYSICAL EXAM:    Initial ED Vitals:   ED Triage Vitals   Enc Vitals Group      BP 01/31/23 1840 163/84      Heart Rate 01/31/23 1840 98      SpO2 Pulse 01/31/23 2127 87      Resp 01/31/23 1840 16      Temp 01/31/23 1840 (!) 38.7 ??C (101.7 ??F)      Temp Source 01/31/23 1840 Tympanic      SpO2 01/31/23 1840 100 %      Weight 01/31/23 1840 68 kg (150 lb)      Height 01/31/23 1840 1.651 m (5' 5)      Head Circumference --       Peak Flow --       Pain Score --       Pain Loc --       Pain Education --       Exclude from Growth Chart --        Recent Vitals:  Vitals:    02/01/23 0155   BP: 110/67   Pulse: 66   Resp: 16   Temp: 36.7 ??C (98.1 ??F)   SpO2: 100%       GEN: Well-appearing, lying in bed, NAD, diaphoretic   Eyes:  No scleral icterus. Conjunctiva non-erythematous. EOMI.  HEENT: NCAT, MMM. Oropharynx clear.  Neck: Supple.  Lymphadenopathy: No cervical or supraclavicular LAD.  CV: Regular rate and rhythm. Murmur appreciated. No costochondral tenderness. No cyanosis or clubbing. Cap Refill < 2 secs   Pulm: CTAB. No wheezing, crackles, or rhonchi.  Abd: Flat.  Normoactive bowel sounds.  Suprapubic/LLQ tenderness. No peritoneal signs.   Neuro: A&O x 3. No focal deficits. Distal sensation to light touch intact.  Ext: No peripheral edema.  Palpable distal pulses.  Skin: No rashes or skin lesions.        LABS/ STUDIES:  All imaging, laboratory studies, and other pertinent tests including electrocardiography were reviewed prior to admission and are summarized within the assessment and plan.     Delynn Flavin, MD,  PGY1  February 01, 2023 1:04 AM

## 2023-02-01 NOTE — Unmapped (Addendum)
Elaine Koch is a 37 y.o. 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      # Lower Abdominal Pain  # Bacterial Vaginosis - Recent Missed Ab  # C/f UTI vs PID vs TOA  Presented with 2 days of fevers, chills, cough, body aches, lower abdominal pain, discomfort with urination and urinary frequency, increased vaginal discharge. Initially febrile to 101.20F, but otherwise VSS. Initial labs notable for WBC 2.1 (baseline ~1.7-2), Hgb 8.3 (baseline 8), UA with sm LE, negative nitrite, 38 WBC, no bacteria. BV positive. S/p CTX x1 in ED. Exam notable for suprapubic and LLQ tenderness to palpation, but improved abdominal pain overall. Urine culture (12/20): Group B Strep.  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 Also notable for recent missed abortion s/p D&C 11/19/2022. Presented to ED for abdominal pain 12/24/2022 with TVUS showing heterogenous collection in endometrium. Followed up with OBGYN 11/13 and 11/25 with downtrending Hcg, resolved vaginal bleeding, and no concern for endometriosis. Denies vaginal bleeding, purulent discharge, or passing retained POC. Does endorse normal period since her D&C. TVUS 12/22 with probable hemorrhagic cyst of the left ovary measuring 4.8 cm, heterogeneous uterus with anterior leiomyoma, normal endometrium thickness at 0.42 cm. No evidence of torsion at this time, but strict return precautions given. Presentation likely related to UTI, recurrent/persistent BV, and hemorrhagic cyst of left ovary. Symptoms improved with antibiotics and she was discharged with plan to complete course of Keflex 500 mg TID for total 5 days and metronidazole 500 mg BID for total of 7 days. Plan to transition to metrogel twice weekly for total of 6 months for suppression/maintenance therapy. Referral placed to OBGYN per patient request as well for follow-up of her recurrent BV and hemorrhagic cyst.     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 Cr 2.27 on OP labs 11/25 and per chart review Cr ranging from 1.8-2.1. Overall, renal function appears to be stable. Transplant Renal US 12/21 with resistive indices within normal limits and stable from prior. Continued home regimen: tacrolimus 18 mg daily, azathioprine 100 mg daily, and sodium bicarbonate 1300 mg TID. Tacrolimus trough during stay was a bit lower than goal, would recommend close attention on follow-up and consideration of dose titration.     # Anemia: Likely secondary to CKD and azathioprine use. Hgb stable on admission at 8.3. Continue to follow-up outpatient  #Depression - Anxiety:  Continue home lexapro 5 mg.

## 2023-02-01 NOTE — Unmapped (Signed)
Fever, body aches x 2 days, lower abd pain, burning with urination, kidney and pancrease transplant in 2022.

## 2023-02-01 NOTE — Unmapped (Signed)
Elaine Koch is alert and oriented with no worsen or new issues at this time.  She was able to tolerate lunch well.  Vitals stable and denies sign and symptoms of septsis at this time.    [name], RN      Problem: Adult Inpatient Plan of Care  Goal: Optimal Comfort and Wellbeing  Outcome: Progressing  Goal: Readiness for Transition of Care  Outcome: Progressing     Problem: Adult Inpatient Plan of Care  Goal: Absence of Hospital-Acquired Illness or Injury  Intervention: Identify and Manage Fall Risk  Recent Flowsheet Documentation  Taken 02/01/2023 1000 by [name], RN  Safety Interventions:   bed alarm   fall reduction program maintained  Taken 02/01/2023 0800 by [name], RN  Safety Interventions:   bed alarm   fall reduction program maintained  Intervention: Prevent and Manage VTE (Venous Thromboembolism) Risk  Recent Flowsheet Documentation  Taken 02/01/2023 0800 by [name], RN  VTE Prevention/Management: ambulation promoted     Problem: Fall Injury Risk  Goal: Absence of Fall and Fall-Related Injury  Intervention: Promote Injury-Free Environment  Recent Flowsheet Documentation  Taken 02/01/2023 1000 by [name], RN  Safety Interventions:   bed alarm   fall reduction program maintained  Taken 02/01/2023 0800 by [name], RN  Safety Interventions:   bed alarm   fall reduction program maintained     Problem: Wound  Goal: Skin Health and Integrity  Intervention: Optimize Skin Protection  Recent Flowsheet Documentation  Taken 02/01/2023 0800 by [name], RN  Pressure Reduction Techniques: frequent weight shift encouraged

## 2023-02-02 LAB — BASIC METABOLIC PANEL
ANION GAP: 12 mmol/L (ref 5–14)
BLOOD UREA NITROGEN: 32 mg/dL — ABNORMAL HIGH (ref 9–23)
BUN / CREAT RATIO: 14
CALCIUM: 9.5 mg/dL (ref 8.7–10.4)
CHLORIDE: 110 mmol/L — ABNORMAL HIGH (ref 98–107)
CO2: 24.8 mmol/L (ref 20.0–31.0)
CREATININE: 2.22 mg/dL — ABNORMAL HIGH (ref 0.55–1.02)
EGFR CKD-EPI (2021) FEMALE: 29 mL/min/{1.73_m2} — ABNORMAL LOW (ref >=60)
GLUCOSE RANDOM: 95 mg/dL (ref 70–179)
POTASSIUM: 5 mmol/L — ABNORMAL HIGH (ref 3.4–4.8)
SODIUM: 147 mmol/L — ABNORMAL HIGH (ref 135–145)

## 2023-02-02 LAB — CBC
HEMATOCRIT: 23.1 % — ABNORMAL LOW (ref 34.0–44.0)
HEMOGLOBIN: 8 g/dL — ABNORMAL LOW (ref 11.3–14.9)
MEAN CORPUSCULAR HEMOGLOBIN CONC: 34.6 g/dL (ref 32.0–36.0)
MEAN CORPUSCULAR HEMOGLOBIN: 37.2 pg — ABNORMAL HIGH (ref 25.9–32.4)
MEAN CORPUSCULAR VOLUME: 107.4 fL — ABNORMAL HIGH (ref 77.6–95.7)
MEAN PLATELET VOLUME: 8.3 fL (ref 6.8–10.7)
PLATELET COUNT: 160 10*9/L (ref 150–450)
RED BLOOD CELL COUNT: 2.15 10*12/L — ABNORMAL LOW (ref 3.95–5.13)
RED CELL DISTRIBUTION WIDTH: 19.8 % — ABNORMAL HIGH (ref 12.2–15.2)
WBC ADJUSTED: 1.7 10*9/L — ABNORMAL LOW (ref 3.6–11.2)

## 2023-02-02 LAB — TACROLIMUS LEVEL, TROUGH: TACROLIMUS, TROUGH: 6.2 ng/mL (ref 5.0–15.0)

## 2023-02-02 MED ADMIN — heparin (porcine) 5,000 unit/mL injection 5,000 Units: 5000 [IU] | SUBCUTANEOUS | @ 18:00:00

## 2023-02-02 MED ADMIN — heparin (porcine) 5,000 unit/mL injection 5,000 Units: 5000 [IU] | SUBCUTANEOUS | @ 02:00:00

## 2023-02-02 MED ADMIN — azathioprine (IMURAN) tablet 100 mg: 100 mg | ORAL | @ 14:00:00

## 2023-02-02 MED ADMIN — cefTRIAXone (ROCEPHIN) 1 g in sodium chloride 0.9 % (NS) 100 mL IVPB-MBP: 1 g | INTRAVENOUS | @ 02:00:00 | Stop: 2023-02-06

## 2023-02-02 MED ADMIN — heparin (porcine) 5,000 unit/mL injection 5,000 Units: 5000 [IU] | SUBCUTANEOUS | @ 10:00:00

## 2023-02-02 MED ADMIN — metroNIDAZOLE (FLAGYL) tablet 500 mg: 500 mg | ORAL | @ 02:00:00 | Stop: 2023-02-08

## 2023-02-02 MED ADMIN — tacrolimus (ENVARSUS XR) extended release tablet 2 mg: 2 mg | ORAL | @ 14:00:00

## 2023-02-02 MED ADMIN — tacrolimus (ENVARSUS XR) extended release tablet 16 mg: 16 mg | ORAL | @ 14:00:00

## 2023-02-02 MED ADMIN — metroNIDAZOLE (FLAGYL) tablet 500 mg: 500 mg | ORAL | @ 14:00:00 | Stop: 2023-02-08

## 2023-02-02 MED ADMIN — sodium bicarbonate tablet 1,300 mg: 1300 mg | ORAL | @ 14:00:00

## 2023-02-02 MED ADMIN — sodium bicarbonate tablet 1,300 mg: 1300 mg | ORAL | @ 18:00:00

## 2023-02-02 MED ADMIN — sodium bicarbonate tablet 1,300 mg: 1300 mg | ORAL | @ 02:00:00

## 2023-02-02 MED ADMIN — escitalopram oxalate (LEXAPRO) tablet 5 mg: 5 mg | ORAL | @ 14:00:00

## 2023-02-02 NOTE — Unmapped (Signed)
Miss flood is awake and oriented x4, vitals stable. Patient is independent and able to ambulate room without assistance. Patient when for endovaginal ultrasound this afternoon. Patient denies s/s of sepsis and states they are feeling good today. Current POC continued.   Problem: Adult Inpatient Plan of Care  Goal: Plan of Care Review  Outcome: Ongoing - Unchanged  Goal: Patient-Specific Goal (Individualized)  Outcome: Ongoing - Unchanged  Goal: Absence of Hospital-Acquired Illness or Injury  Outcome: Ongoing - Unchanged  Intervention: Identify and Manage Fall Risk  Recent Flowsheet Documentation  Taken 02/02/2023 1400 by Eulis Canner, RN  Safety Interventions:   fall reduction program maintained   low bed   nonskid shoes/slippers when out of bed  Taken 02/02/2023 1200 by Eulis Canner, RN  Safety Interventions:   fall reduction program maintained   low bed   nonskid shoes/slippers when out of bed  Taken 02/02/2023 1000 by Eulis Canner, RN  Safety Interventions:   fall reduction program maintained   low bed   nonskid shoes/slippers when out of bed  Taken 02/02/2023 0800 by Eulis Canner, RN  Safety Interventions:   fall reduction program maintained   low bed   nonskid shoes/slippers when out of bed  Intervention: Prevent Infection  Recent Flowsheet Documentation  Taken 02/02/2023 1400 by Eulis Canner, RN  Infection Prevention:   cohorting utilized   hand hygiene promoted   personal protective equipment utilized   equipment surfaces disinfected   environmental surveillance performed   single patient room provided  Taken 02/02/2023 1200 by Eulis Canner, RN  Infection Prevention:   cohorting utilized   hand hygiene promoted   personal protective equipment utilized   equipment surfaces disinfected   environmental surveillance performed   single patient room provided  Taken 02/02/2023 1000 by Eulis Canner, RN  Infection Prevention:   cohorting utilized   hand hygiene promoted   personal protective equipment utilized   equipment surfaces disinfected   environmental surveillance performed   single patient room provided  Taken 02/02/2023 0800 by Eulis Canner, RN  Infection Prevention:   cohorting utilized   hand hygiene promoted   personal protective equipment utilized   equipment surfaces disinfected   environmental surveillance performed   single patient room provided  Goal: Optimal Comfort and Wellbeing  Outcome: Ongoing - Unchanged  Goal: Readiness for Transition of Care  Outcome: Ongoing - Unchanged  Goal: Rounds/Family Conference  Outcome: Ongoing - Unchanged     Problem: Fall Injury Risk  Goal: Absence of Fall and Fall-Related Injury  Outcome: Ongoing - Unchanged  Intervention: Promote Scientist, clinical (histocompatibility and immunogenetics) Documentation  Taken 02/02/2023 1400 by Eulis Canner, RN  Safety Interventions:   fall reduction program maintained   low bed   nonskid shoes/slippers when out of bed  Taken 02/02/2023 1200 by Eulis Canner, RN  Safety Interventions:   fall reduction program maintained   low bed   nonskid shoes/slippers when out of bed  Taken 02/02/2023 1000 by Eulis Canner, RN  Safety Interventions:   fall reduction program maintained   low bed   nonskid shoes/slippers when out of bed  Taken 02/02/2023 0800 by Eulis Canner, RN  Safety Interventions:   fall reduction program maintained   low bed   nonskid shoes/slippers when out of bed     Problem: Pain Acute  Goal: Optimal Pain Control and Function  Outcome: Ongoing - Unchanged     Problem:  Comorbidity Management  Goal: Blood Glucose Levels Within Targeted Range  Outcome: Ongoing - Unchanged  Goal: Maintenance of Heart Failure Symptom Control  Outcome: Ongoing - Unchanged  Goal: Blood Pressure in Desired Range  Outcome: Ongoing - Unchanged  Goal: Bariatric Home Regimen Maintained  Outcome: Ongoing - Unchanged

## 2023-02-02 NOTE — Unmapped (Signed)
Family Medicine Inpatient Service  Progress Note    Team: Family Medicine [facility] (pgr [phone])    Hospital Day: 1    ASSESSMENT / PLAN:   Elaine Koch is a 37 y.o. 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     # Lower Abdominal Pain  # Bacterial Vaginosis - Recent Missed Ab  # C/f UTI vs PID vs TOA  Presented with 2 days of fevers, chills, cough, body aches, lower abdominal pain, discomfort with urination and urinary frequency, increased vaginal discharge. Initially febrile to 101.95F, but otherwise VSS. Initial labs notable for WBC 2.1 (baseline ~1.7-2), Hgb 8.3 (baseline 8), UA with sm LE, negative nitrite, 38 WBC, no bacteria. BV positive. S/p CTX x1 in ED. Exam notable for suprapubic and LLQ tenderness to palpation, but improved abdominal pain overall.   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 Also notable for recent missed abortion s/p D&C 11/19/2022. Presented to ED for abdominal pain 12/24/2022 with TVUS showing heterogenous collection in endometrium. Followed up with OBGYN 11/13 and 11/25 with downtrending Hcg, resolved vaginal bleeding, and no concern for endometriosis. Denies vaginal bleeding, purulent discharge, or passing retained POC. Does endorse normal period since her D&C. Presentation could be related to UTI without pyelonephritis, however concern still exists for gynecologic etiology including PID vs tubo-ovarian abscess given left sided discomfort. Less concern for retained products of conception or endometritis at this time.   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 Cr 2.27 on OP labs 11/25 and per chart review Cr ranging from 1.8-2.1. Overall, renal function appears to be stable. Transplant Renal US 12/21 with resistive indices within normal limits and stable from prior.   - Cont home sodium bicarb 1300 mg TID  - Cont home tacrolimus (Envarsus XR) 18 mg qd  - Cont home AZA 100 mg every day  - Prednisone free regimen  - Tacrolimus trough levels ordered prior to morning dose. Goal tacrolimus level is 8-10 ng/mL.     # Anemia  Likely secondary to CKD and azathioprine use. Hgb stable on admission at 8.3.  - Daily CBC     #Depression - Anxiety:  Continue home lexapro 5 mg.        # Checklist:  - IVF None  - Daily labs needed: CBC and BMP  - Diet Regular  - Bowel Regimen: No indication for a bowel regimen at this time  - DVT: SQ Heparin   - Code Status:   Orders Placed This Encounter   Procedures    Full Code     Standing Status:   Standing     Number of Occurrences:   1     - Dispo: Floor    [ ]  Anticipated Discharge  Location: Home  [ ]  PT/OT/DME: No needs anticipated  [ ]  CM/SW needs: None anticipated  [ ]  Follow up appt: Appointment needed    SUBJECTIVE:  Interval events: NAEON. Continues to feel improved from presentation. States her abdominal pain is fine as long as not palpating. States her vaginal discharge is improved and was very similar to her prior discharge with BV. Has had a normal period since her D&C.       REVIEW OF SYSTEMS:  Pertinent positives and negatives per HPI. A complete review of systems otherwise negative.    PHYSICAL EXAM:    No intake or output data in the 24 hours ending 02/02/23 0617      Recent Vitals:  Vitals: 02/01/23 2051   BP: 124/74   Pulse: 86   Resp: 14   Temp: 36.8 ??C (98.2 ??F)   SpO2: 100%       GEN: well appearing, lying in bed, NAD   HEENT: NCAT, No scleral icterus. Conjunctiva non-erythematous. MMM.  CV: Regular rate and rhythm. Flow murmur present.  No rubs/gallops.  Pulm: Normal work of breathing on RA. CTAB. No wheezing, crackles, or rhonchi.  Abd: Flat.  TTP in suprapubic and LLQ, otherwise non-tender.  No guarding, rebound.     Neuro: A&Ox3. No focal deficits.  Ext: No peripheral edema.  Palpable distal pulses. RUE fistula present  Skin: No rashes or skin lesions.       LABS/ STUDIES:    All imaging, laboratory studies, and other pertinent tests including electrocardiography within the last 24 hours were reviewed and are summarized within the assessment and plan.     NUTRITION:                             Cherie Dark, MD  Resident Physician Family Medicine, PGY-2

## 2023-02-02 NOTE — Unmapped (Signed)
Problem: Adult Inpatient Plan of Care  Goal: Plan of Care Review  02/02/2023 0110 by Wonda Olds, RN  Outcome: Ongoing - Unchanged  02/01/2023 2229 by Wonda Olds, RN  Outcome: Ongoing - Unchanged  02/01/2023 2221 by Wonda Olds, RN  Outcome: Ongoing - Unchanged  Goal: Patient-Specific Goal (Individualized)  02/02/2023 0110 by Wonda Olds, RN  Outcome: Ongoing - Unchanged  02/01/2023 2229 by Wonda Olds, RN  Outcome: Ongoing - Unchanged  02/01/2023 2221 by Wonda Olds, RN  Outcome: Ongoing - Unchanged  Goal: Absence of Hospital-Acquired Illness or Injury  02/02/2023 0110 by Wonda Olds, RN  Outcome: Ongoing - Unchanged  02/01/2023 2229 by Wonda Olds, RN  Outcome: Ongoing - Unchanged  02/01/2023 2221 by Wonda Olds, RN  Outcome: Ongoing - Unchanged  Intervention: Identify and Manage Fall Risk  Recent Flowsheet Documentation  Taken 02/02/2023 0005 by Wonda Olds, RN  Safety Interventions:   fall reduction program maintained   lighting adjusted for tasks/safety   low bed  Taken 02/01/2023 2200 by Wonda Olds, RN  Safety Interventions:   bed alarm   fall reduction program maintained   lighting adjusted for tasks/safety   low bed  Taken 02/01/2023 2000 by Wonda Olds, RN  Safety Interventions:   bed alarm   fall reduction program maintained   lighting adjusted for tasks/safety   low bed  Goal: Optimal Comfort and Wellbeing  02/02/2023 0110 by Wonda Olds, RN  Outcome: Ongoing - Unchanged  02/01/2023 2229 by Wonda Olds, RN  Outcome: Ongoing - Unchanged  02/01/2023 2221 by Wonda Olds, RN  Outcome: Ongoing - Unchanged  Goal: Readiness for Transition of Care  02/02/2023 0110 by Wonda Olds, RN  Outcome: Ongoing - Unchanged  02/01/2023 2229 by Wonda Olds, RN  Outcome: Ongoing - Unchanged  02/01/2023 2221 by Wonda Olds, RN  Outcome: Ongoing - Unchanged  Goal: Rounds/Family Conference  02/02/2023 0110 by Wonda Olds, RN  Outcome: Ongoing - Unchanged  02/01/2023 2229 by Wonda Olds, RN  Outcome: Ongoing - Unchanged  02/01/2023 2221 by Wonda Olds, RN  Outcome: Ongoing - Unchanged     Problem: Pain Acute  Goal: Optimal Pain Control and Function  02/02/2023 0110 by Wonda Olds, RN  Outcome: Ongoing - Unchanged  02/01/2023 2229 by Wonda Olds, RN  Outcome: Ongoing - Unchanged  02/01/2023 2221 by Wonda Olds, RN  Outcome: Ongoing - Unchanged     Problem: Fall Injury Risk  Goal: Absence of Fall and Fall-Related Injury  02/02/2023 0110 by Wonda Olds, RN  Outcome: Ongoing - Unchanged  02/01/2023 2229 by Wonda Olds, RN  Outcome: Ongoing - Unchanged  02/01/2023 2221 by Wonda Olds, RN  Outcome: Ongoing - Unchanged  Intervention: Promote Injury-Free Environment  Recent Flowsheet Documentation  Taken 02/02/2023 0005 by Wonda Olds, RN  Safety Interventions:   fall reduction program maintained   lighting adjusted for tasks/safety   low bed  Taken 02/01/2023 2200 by Wonda Olds, RN  Safety Interventions:   bed alarm   fall reduction program maintained   lighting adjusted for tasks/safety   low bed  Taken 02/01/2023 2000 by Wonda Olds, RN  Safety Interventions:   bed alarm   fall reduction program maintained   lighting adjusted for tasks/safety   low bed     Problem: Wound  Goal: Optimal Coping  02/02/2023 0110 by Wonda Olds, RN  Outcome: Ongoing - Unchanged  02/01/2023 2229 by Wonda Olds, RN  Outcome: Ongoing - Unchanged  02/01/2023 2221 by Wonda Olds, RN  Outcome: Ongoing - Unchanged  Goal: Optimal Functional Ability  02/02/2023 0110 by Wonda Olds, RN  Outcome: Ongoing - Unchanged  02/01/2023 2229 by Wonda Olds, RN  Outcome: Ongoing - Unchanged  02/01/2023 2221 by Wonda Olds, RN  Outcome: Ongoing - Unchanged  Intervention: Optimize Functional Ability  Recent Flowsheet Documentation  Taken 02/01/2023 2000 by Wonda Olds, RN  Activity Management:   ambulated in room   ambulated to bathroom   back to bed  Goal: Absence of Infection Signs and Symptoms  02/02/2023 0110 by Wonda Olds, RN  Outcome: Ongoing - Unchanged  02/01/2023 2229 by Wonda Olds, RN  Outcome: Ongoing - Unchanged  02/01/2023 2221 by Wonda Olds, RN  Outcome: Ongoing - Unchanged  Intervention: Prevent or Manage Infection  Recent Flowsheet Documentation  Taken 02/02/2023 0005 by Wonda Olds, RN  Infection Management: aseptic technique maintained  Goal: Improved Oral Intake  02/02/2023 0110 by Wonda Olds, RN  Outcome: Ongoing - Unchanged  02/01/2023 2229 by Wonda Olds, RN  Outcome: Ongoing - Unchanged  02/01/2023 2221 by Wonda Olds, RN  Outcome: Ongoing - Unchanged  Goal: Optimal Pain Control and Function  02/02/2023 0110 by Wonda Olds, RN  Outcome: Ongoing - Unchanged  02/01/2023 2229 by Wonda Olds, RN  Outcome: Ongoing - Unchanged  02/01/2023 2221 by Wonda Olds, RN  Outcome: Ongoing - Unchanged  Goal: Skin Health and Integrity  02/02/2023 0110 by Wonda Olds, RN  Outcome: Ongoing - Unchanged  02/01/2023 2229 by Wonda Olds, RN  Outcome: Ongoing - Unchanged  02/01/2023 2221 by Wonda Olds, RN  Outcome: Ongoing - Unchanged  Intervention: Optimize Skin Protection  Recent Flowsheet Documentation  Taken 02/01/2023 2000 by Wonda Olds, RN  Activity Management:   ambulated in room   ambulated to bathroom   back to bed  Goal: Optimal Wound Healing  02/02/2023 0110 by Wonda Olds, RN  Outcome: Ongoing - Unchanged  02/01/2023 2229 by Wonda Olds, RN  Outcome: Ongoing - Unchanged  02/01/2023 2221 by Wonda Olds, RN  Outcome: Ongoing - Unchanged     Problem: Comorbidity Management  Goal: Blood Glucose Levels Within Targeted Range  Outcome: Ongoing - Unchanged  Goal: Maintenance of Heart Failure Symptom Control  Outcome: Ongoing - Unchanged  Goal: Blood Pressure in Desired Range  Outcome: Ongoing - Unchanged  Goal: Bariatric Home Regimen Maintained  Outcome: Ongoing - Unchanged

## 2023-02-02 NOTE — Unmapped (Signed)
Problem: Adult Inpatient Plan of Care  Goal: Plan of Care Review  Outcome: Ongoing - Unchanged  Goal: Patient-Specific Goal (Individualized)  Outcome: Ongoing - Unchanged  Goal: Absence of Hospital-Acquired Illness or Injury  Outcome: Ongoing - Unchanged  Intervention: Identify and Manage Fall Risk  Recent Flowsheet Documentation  Taken 02/01/2023 2200 by Wonda Olds, RN  Safety Interventions:   bed alarm   fall reduction program maintained   lighting adjusted for tasks/safety   low bed  Taken 02/01/2023 2000 by Wonda Olds, RN  Safety Interventions:   bed alarm   fall reduction program maintained   lighting adjusted for tasks/safety   low bed  Goal: Optimal Comfort and Wellbeing  Outcome: Ongoing - Unchanged  Goal: Readiness for Transition of Care  Outcome: Ongoing - Unchanged  Goal: Rounds/Family Conference  Outcome: Ongoing - Unchanged     Problem: Pain Acute  Goal: Optimal Pain Control and Function  Outcome: Ongoing - Unchanged     Problem: Fall Injury Risk  Goal: Absence of Fall and Fall-Related Injury  Outcome: Ongoing - Unchanged  Intervention: Promote Injury-Free Environment  Recent Flowsheet Documentation  Taken 02/01/2023 2200 by Wonda Olds, RN  Safety Interventions:   bed alarm   fall reduction program maintained   lighting adjusted for tasks/safety   low bed  Taken 02/01/2023 2000 by Wonda Olds, RN  Safety Interventions:   bed alarm   fall reduction program maintained   lighting adjusted for tasks/safety   low bed

## 2023-02-03 ENCOUNTER — Encounter (HOSPITAL_COMMUNITY): Payer: Self-pay

## 2023-02-03 DIAGNOSIS — Z940 Kidney transplant status: Principal | ICD-10-CM

## 2023-02-03 LAB — CBC
HEMATOCRIT: 20.2 % — ABNORMAL LOW (ref 34.0–44.0)
HEMOGLOBIN: 7 g/dL — ABNORMAL LOW (ref 11.3–14.9)
MEAN CORPUSCULAR HEMOGLOBIN CONC: 34.5 g/dL (ref 32.0–36.0)
MEAN CORPUSCULAR HEMOGLOBIN: 37.3 pg — ABNORMAL HIGH (ref 25.9–32.4)
MEAN CORPUSCULAR VOLUME: 107.9 fL — ABNORMAL HIGH (ref 77.6–95.7)
MEAN PLATELET VOLUME: 8.9 fL (ref 6.8–10.7)
PLATELET COUNT: 161 10*9/L (ref 150–450)
RED BLOOD CELL COUNT: 1.87 10*12/L — ABNORMAL LOW (ref 3.95–5.13)
RED CELL DISTRIBUTION WIDTH: 19.5 % — ABNORMAL HIGH (ref 12.2–15.2)
WBC ADJUSTED: 1.2 10*9/L — ABNORMAL LOW (ref 3.6–11.2)

## 2023-02-03 LAB — BASIC METABOLIC PANEL
ANION GAP: 12 mmol/L (ref 5–14)
BLOOD UREA NITROGEN: 27 mg/dL — ABNORMAL HIGH (ref 9–23)
BUN / CREAT RATIO: 13
CALCIUM: 9.2 mg/dL (ref 8.7–10.4)
CHLORIDE: 109 mmol/L — ABNORMAL HIGH (ref 98–107)
CO2: 23.7 mmol/L (ref 20.0–31.0)
CREATININE: 2.1 mg/dL — ABNORMAL HIGH (ref 0.55–1.02)
EGFR CKD-EPI (2021) FEMALE: 31 mL/min/{1.73_m2} — ABNORMAL LOW (ref >=60)
GLUCOSE RANDOM: 87 mg/dL (ref 70–179)
POTASSIUM: 4.8 mmol/L (ref 3.4–4.8)
SODIUM: 145 mmol/L (ref 135–145)

## 2023-02-03 LAB — TACROLIMUS LEVEL, TROUGH: TACROLIMUS, TROUGH: 5.3 ng/mL (ref 5.0–15.0)

## 2023-02-03 MED ORDER — CEPHALEXIN 500 MG CAPSULE
ORAL_CAPSULE | Freq: Three times a day (TID) | ORAL | 0 refills | 3.00 days | Status: CP
Start: 2023-02-03 — End: 2023-02-06
  Filled 2023-02-03: qty 7, 3d supply, fill #0

## 2023-02-03 MED ORDER — METRONIDAZOLE 500 MG TABLET
ORAL_TABLET | Freq: Two times a day (BID) | ORAL | 0 refills | 5.00 days | Status: CP
Start: 2023-02-03 — End: 2023-02-08
  Filled 2023-02-03: qty 9, 5d supply, fill #0

## 2023-02-03 MED ADMIN — tacrolimus (ENVARSUS XR) extended release tablet 16 mg: 16 mg | ORAL | @ 14:00:00 | Stop: 2023-02-03

## 2023-02-03 MED ADMIN — heparin (porcine) 5,000 unit/mL injection 5,000 Units: 5000 [IU] | SUBCUTANEOUS | @ 11:00:00 | Stop: 2023-02-03

## 2023-02-03 MED ADMIN — cefTRIAXone (ROCEPHIN) 1 g in sodium chloride 0.9 % (NS) 100 mL IVPB-MBP: 1 g | INTRAVENOUS | @ 02:00:00 | Stop: 2023-02-06

## 2023-02-03 MED ADMIN — sodium bicarbonate tablet 1,300 mg: 1300 mg | ORAL | @ 01:00:00

## 2023-02-03 MED ADMIN — escitalopram oxalate (LEXAPRO) tablet 5 mg: 5 mg | ORAL | @ 14:00:00 | Stop: 2023-02-03

## 2023-02-03 MED ADMIN — azathioprine (IMURAN) tablet 100 mg: 100 mg | ORAL | @ 14:00:00 | Stop: 2023-02-03

## 2023-02-03 MED ADMIN — sodium bicarbonate tablet 1,300 mg: 1300 mg | ORAL | @ 14:00:00 | Stop: 2023-02-03

## 2023-02-03 MED ADMIN — metroNIDAZOLE (FLAGYL) tablet 500 mg: 500 mg | ORAL | @ 01:00:00 | Stop: 2023-02-08

## 2023-02-03 MED ADMIN — heparin (porcine) 5,000 unit/mL injection 5,000 Units: 5000 [IU] | SUBCUTANEOUS | @ 02:00:00

## 2023-02-03 MED ADMIN — sodium bicarbonate tablet 1,300 mg: 1300 mg | ORAL | @ 19:00:00 | Stop: 2023-02-03

## 2023-02-03 MED ADMIN — tacrolimus (ENVARSUS XR) extended release tablet 2 mg: 2 mg | ORAL | @ 14:00:00 | Stop: 2023-02-03

## 2023-02-03 MED ADMIN — metroNIDAZOLE (FLAGYL) tablet 500 mg: 500 mg | ORAL | @ 14:00:00 | Stop: 2023-02-03

## 2023-02-03 MED ADMIN — acetaminophen (TYLENOL) tablet 650 mg: 650 mg | ORAL

## 2023-02-03 MED ADMIN — cephalexin (KEFLEX) capsule 500 mg: 500 mg | ORAL | @ 19:00:00 | Stop: 2023-02-03

## 2023-02-03 MED FILL — METRONIDAZOLE 0.75 % (37.5 MG/5 GRAM) VAGINAL GEL: VAGINAL | 18 days supply | Qty: 70 | Fill #0

## 2023-02-03 NOTE — Unmapped (Signed)
Patient awake and oriented x4, vitals stable, no complaints of pain. Patient verbalizes understanding of AVS. Patient shown to pharmacy so she can pick up her abx. Patient agreed to current POC.     Problem: Adult Inpatient Plan of Care  Goal: Plan of Care Review  02/03/2023 1359 by Eulis Canner, RN  Outcome: Discharged to Home  02/03/2023 1358 by Eulis Canner, RN  Outcome: Ongoing - Unchanged  Goal: Patient-Specific Goal (Individualized)  02/03/2023 1359 by Eulis Canner, RN  Outcome: Discharged to Home  02/03/2023 1358 by Eulis Canner, RN  Outcome: Ongoing - Unchanged  Goal: Absence of Hospital-Acquired Illness or Injury  02/03/2023 1359 by Eulis Canner, RN  Outcome: Discharged to Home  02/03/2023 1358 by Eulis Canner, RN  Outcome: Ongoing - Unchanged  Intervention: Identify and Manage Fall Risk  Recent Flowsheet Documentation  Taken 02/03/2023 1000 by Eulis Canner, RN  Safety Interventions:   fall reduction program maintained   low bed   nonskid shoes/slippers when out of bed  Taken 02/03/2023 0800 by Eulis Canner, RN  Safety Interventions:   fall reduction program maintained   low bed   nonskid shoes/slippers when out of bed  Intervention: Prevent Infection  Recent Flowsheet Documentation  Taken 02/03/2023 1000 by Eulis Canner, RN  Infection Prevention:   cohorting utilized   hand hygiene promoted   personal protective equipment utilized   equipment surfaces disinfected   environmental surveillance performed   single patient room provided  Taken 02/03/2023 0800 by Eulis Canner, RN  Infection Prevention:   cohorting utilized   hand hygiene promoted   personal protective equipment utilized   equipment surfaces disinfected   environmental surveillance performed   single patient room provided  Goal: Optimal Comfort and Wellbeing  02/03/2023 1359 by Eulis Canner, RN  Outcome: Discharged to Home  02/03/2023 1358 by Eulis Canner, RN  Outcome: Ongoing - Unchanged  Goal: Readiness for Transition of Care  02/03/2023 1359 by Eulis Canner, RN  Outcome: Discharged to Home  02/03/2023 1358 by Eulis Canner, RN  Outcome: Ongoing - Unchanged  Goal: Rounds/Family Conference  02/03/2023 1359 by Eulis Canner, RN  Outcome: Discharged to Home  02/03/2023 1358 by Eulis Canner, RN  Outcome: Ongoing - Unchanged     Problem: Pain Acute  Goal: Optimal Pain Control and Function  02/03/2023 1359 by Eulis Canner, RN  Outcome: Discharged to Home  02/03/2023 1358 by Eulis Canner, RN  Outcome: Ongoing - Unchanged     Problem: Fall Injury Risk  Goal: Absence of Fall and Fall-Related Injury  02/03/2023 1359 by Eulis Canner, RN  Outcome: Discharged to Home  02/03/2023 1358 by Eulis Canner, RN  Outcome: Ongoing - Unchanged  Intervention: Promote Injury-Free Environment  Recent Flowsheet Documentation  Taken 02/03/2023 1000 by Eulis Canner, RN  Safety Interventions:   fall reduction program maintained   low bed   nonskid shoes/slippers when out of bed  Taken 02/03/2023 0800 by Eulis Canner, RN  Safety Interventions:   fall reduction program maintained   low bed   nonskid shoes/slippers when out of bed     Problem: Comorbidity Management  Goal: Blood Glucose Levels Within Targeted Range  02/03/2023 1359 by Eulis Canner, RN  Outcome: Discharged to Home  02/03/2023 1358 by Eulis Canner, RN  Outcome: Ongoing - Unchanged  Goal: Maintenance of Heart Failure Symptom Control  02/03/2023 1359 by Eulis Canner, RN  Outcome: Discharged to Home  02/03/2023 1358 by Eulis Canner, RN  Outcome: Ongoing - Unchanged  Goal: Blood Pressure in Desired Range  02/03/2023 1359 by Eulis Canner, RN  Outcome: Discharged to Home  02/03/2023 1358 by Eulis Canner, RN  Outcome: Ongoing - Unchanged  Goal: Bariatric Home Regimen Maintained  02/03/2023 1359 by Eulis Canner, RN  Outcome: Discharged to Home  02/03/2023 1358 by Eulis Canner, RN  Outcome: Ongoing - Unchanged

## 2023-02-03 NOTE — Unmapped (Signed)
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      # Lower Abdominal Pain  # Bacterial Vaginosis - Recent Missed Ab  # C/f UTI vs PID vs TOA  Presented with 2 days of fevers, chills, cough, body aches, lower abdominal pain, discomfort with urination and urinary frequency, increased vaginal discharge. Initially febrile to 101.19F, but otherwise VSS. Initial labs notable for WBC 2.1 (baseline ~1.7-2), Hgb 8.3 (baseline 8), UA with sm LE, negative nitrite, 38 WBC, no bacteria. BV positive. S/p CTX x1 in ED. Exam notable for suprapubic and LLQ tenderness to palpation, but improved abdominal pain overall. Urine culture (12/20): Group B Strep.  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 Also notable for recent missed abortion s/p D&C 11/19/2022. Presented to ED for abdominal pain 12/24/2022 with TVUS showing heterogenous collection in endometrium. Followed up with OBGYN 11/13 and 11/25 with downtrending Hcg, resolved vaginal bleeding, and no concern for endometriosis. Denies vaginal bleeding, purulent discharge, or passing retained POC. Does endorse normal period since her D&C. TVUS 12/22 with probable hemorrhagic cyst of the left ovary measuring 4.8 cm, heterogeneous uterus with anterior leiomyoma, normal endometrium thickness at 0.42 cm. No evidence of torsion at this time, but strict return precautions given. Presentation likely related to UTI, recurrent/persistent BV, and hemorrhagic cyst of left ovary. Symptoms improved with antibiotics and she was discharged with plan to complete course of Keflex 500 mg TID for total 5 days and metronidazole 500 mg BID for total of 7 days. Plan to transition to metrogel twice weekly for total of 6 months for suppression/maintenance therapy. Referral placed to OBGYN per patient request as well for follow-up of her recurrent BV and hemorrhagic cyst.     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 Cr 2.27 on OP labs 11/25 and per chart review Cr ranging from 1.8-2.1. Overall, renal function appears to be stable.  Transplant Renal US 12/21 with resistive indices within normal limits and stable from prior. Continued home regimen: tacrolimus 18 mg daily, azathioprine 100 mg daily, and sodium bicarbonate 1300 mg TID. Tacrolimus trough during stay was a bit lower than goal, would recommend close attention on follow-up and consideration of dose titration.     # Anemia: Likely secondary to CKD and azathioprine use. Hgb stable on admission at 8.3. Continue to follow-up outpatient  #Depression - Anxiety:  Continue home lexapro 5 mg.      Touchbase with Outpatient Provider:  Warm Handoff: Completed on 02/03/23 by Aleen Campi, MD  (Resident) via [facility]    Procedures:  None  No admission procedures for hospital encounter.  ______________________________________________________________________  Discharge Medications:     Your Medication List        STOP taking these medications      azithromycin 250 MG tablet  Commonly known as: ZITHROMAX            START taking these medications      cephalexin 500 MG capsule  Commonly known as: KEFLEX  Take 1 capsule (500 mg total) by mouth every eight (8) hours for 7 doses.            CHANGE how you take these medications      metroNIDAZOLE 500 MG tablet  Commonly known as: FLAGYL  Take 1 tablet (500 mg total) by mouth two (2) times a day for 9 doses.  What changed: Another medication with the same name was changed. Make sure you understand how and when to take each.     metroNIDAZOLE 0.75 % (37.5mg /5 gram) vaginal gel  Commonly known as: METROGEL  Insert into the vagina Two (2) times a week. For the next 6 months as suppression treatment for BV  Start taking on: February 08, 2023  What changed:   when to take this  additional instructions  These instructions start on February 08, 2023. If you are unsure what to do until then, ask your doctor or other care provider.            CONTINUE taking these medications      acetaminophen 325 MG tablet  Commonly known as: Tylenol  Take 2 tablets (650 mg total) by mouth every four (4) hours as needed for pain.     azathioprine 50 mg tablet  Commonly known as: IMURAN  Take 2 tablets (100 mg total) by mouth daily     butalbital-acetaminophen-caffeine 50-325-40 mg per tablet  Commonly known as: ESGIC  Take 1 tablet by mouth every four (4) hours as needed for pain.     ENVARSUS XR 4 mg Tb24 extended release tablet  Generic drug: tacrolimus  Take 4 tablets (16 mg total) by mouth daily. Take with two 1 mg tablets for a total of 18 mg     ENVARSUS XR 1 mg Tb24 extended release tablet  Generic drug: tacrolimus  Take 2 tablets (2 mg total) by mouth daily with FOUR 4mg  tablets for total daily dose of 18mg      escitalopram oxalate 5 MG tablet  Commonly known as: LEXAPRO  Take 1 tablet (5 mg total) by mouth daily.     sodium bicarbonate 650 mg tablet  Take 2 tablets (1,300 mg total) by mouth Three (3) times a day.              Allergies:  Iodinated contrast media, Nickel, Privigen [immun glob g(igg)-pro-iga 0-50], Propranolol, Eye irrigating solution [ophthalmic  irrigation solution], Iodine, Naltrexone, and Uni-cortrom  ______________________________________________________________________  Pending Test Results (if blank, then none):  Pending Labs       Order Current Status    Chlamydia/Gonorrhoeae NAA In process    Blood Culture Preliminary result    Blood Culture Preliminary result            Most Recent Labs:  All lab results last 24 hours -   Recent Results (from the past 24 hours)   CBC    Collection Time: 02/03/23  6:58 AM   Result Value Ref Range    WBC 1.2 (L) 3.6 - 11.2 10*9/L    RBC 1.87 (L) 3.95 - 5.13 10*12/L    HGB 7.0 (L) 11.3 - 14.9 g/dL    HCT 63.1 (L) 49.7 - 44.0 %    MCV 107.9 (H) 77.6 - 95.7 fL    MCH 37.3 (H) 25.9 - 32.4 pg    MCHC 34.5 32.0 - 36.0 g/dL    RDW 02.6 (H) 37.8 - 15.2 %    MPV 8.9 6.8 - 10.7 fL    Platelet 161 150 - 450 10*9/L   Basic Metabolic Panel    Collection Time: 02/03/23  6:58 AM   Result Value Ref Range    Sodium 145 135 - 145 mmol/L    Potassium 4.8 3.4 - 4.8 mmol/L    Chloride 109 (H) 98 - 107 mmol/L    CO2 23.7 20.0 - 31.0 mmol/L    Anion Gap 12 5 - 14 mmol/L    BUN 27 (H) 9 - 23 mg/dL    Creatinine 5.88 (H) 0.55 - 1.02 mg/dL    BUN/Creatinine Ratio 13     eGFR CKD-EPI (2021) Female 31 (L) >=60 mL/min/1.73m2    Glucose 87 70 - 179 mg/dL    Calcium 9.2 8.7 - 50.2 mg/dL   Tacrolimus Level, Trough Collection Time: 02/03/23  6:58 AM   Result Value Ref Range    Tacrolimus, Trough 5.3 5.0 - 15.0 ng/mL       Relevant Studies/Radiology (if blank, then none):  [place] Endovaginal (Non-OB)  Result Date: 02/02/2023  EXAM: [place] ENDOVAGINAL (NON-OB) ACCESSION: [id number] UN REPORT DATE: 02/02/2023 2:31 PM CLINICAL INDICATION: 37 years old with LLQ tenderness, fever r/o TOA vs PID  LMP: 01/09/2023 COMPARISON: December 24, 2022 TECHNIQUE: As per sonographer, the endovaginal pelvic procedure was explained to the patient and the patient verbally consented to the exam. Ultrasound views of the pelvis were obtained endovaginally using gray scale and color Doppler imaging. Spectral Doppler imaging was also performed. FINDINGS: UTERUS/CERVIX: The uterus was anteverted and measured 11.2 x 4.9 x 7.5 cm the uterus is moderately heterogeneous. An anterior leiomyoma intramural measures 1.9 x 2.0 x 1.9 cm. No other masses identified. The endometrium is normal in thickness measuring 0.42 cm. Ovaries: Ovaries were seen transvaginally. Physiologic changes are present bilaterally. There is a unilocular cyst with fibrin clot in the left ovary measuring 4.2 x 4.8 x 3.4 cm. No other ovarian or adnexal lesions. The right ovary measured 4.5 x 1.8 x 4.0 cm and the left ovary measured 5.6 x 3.2 x 4.1 cm. OTHER: No abnormal pelvic free fluid.     Probable hemorrhagic cyst left ovary measures 4.8 cm. O RADS-2. Resolved right ovarian dominant follicle/corpus luteum. Intramural leiomyoma. Please see below for data measurements: LMP: 01/09/2023 Endovaginal consent? As per sonographer, the endovaginal pelvic procedure was explained to the patient and the patient verbally consented to the exam. Uterus: Sagittal 11.2 cm;  AP 4.9 cm; Transverse 7.5 cm Endometrium: 0.42 cm Right ovary: Sagittal 4.5 cm; AP 1.8 cm; Transverse 4.0 cm Left ovary: Sagittal 5.6 cm; AP 3.2 cm; Transverse 4.1 cm    US Renal Transplant W Doppler  Result Date: 02/01/2023  EXAM: US RENAL Glenna Durand ACCESSION: [id number] UN REPORT DATE: 02/01/2023 9:42 AM CLINICAL INDICATION: 37 years old with uti, hx renal transplant 2021 COMPARISON: Prior examination dated August 16, 2022 and August 08, 2022 TECHNIQUE:  Ultrasound views of the renal transplant were obtained using gray scale and color and spectral Doppler imaging. Views of the urinary bladder were obtained using gray scale and limited color Doppler imaging. FINDINGS: TRANSPLANTED KIDNEY: The renal transplant was located in the left lower quadrant. Normal size and echogenicity.  No solid masses or calculi. Minimally complex fluid collection is seen along the inferior aspect of the transplant left lower quadrant kidney measuring 3.7 x 2.3 x 2.0 cm no hydronephrosis. Minimal pelviectasis of the renal transplant. VESSELS: - Perfusion: Using power Doppler, normal perfusion was seen throughout the renal parenchyma. - Resistive indices in the renal transplant are stable compared with prior examination. - Main renal artery/iliac artery: Patent - Main renal vein/iliac vein: Patent BLADDER: Incompletely distended otherwise unremarkable     Resistive indices in the renal transplant arteries, within normal limits. Stable liquefied hematoma along the inferior margin of the left lower quadrant transplant kidney measuring 3.7 cm in greatest dimension. Please see below for data measurements: Transplant location: LLQ Renal Transplant: Sagittal 11.01 cm; AP 5.0 cm; Transverse 4.9 cm Segmental artery superior resistive index: 0.70 Segmental artery mid resistive index: 0.71 Segmental artery inferior resistive index: 0.77 Previous resistive indices range of segmental arteries: 0.68-0.79 Main renal artery peak systolic velocity at anastomosis: 1.25 m/s Main renal artery hilum resistive index: 0.70 Main renal artery mid resistive index: 0.86 Main renal artery anastomosis resistive index: 0.87 Previous resistive indices range of main renal artery: 0.80-0.88 Main renal vein: patent Iliac artery: Patent Iliac vein: Patent Bladder volume prevoid: 26  mL Bladder volume postvoid: mL     XR Chest 2 views  Result Date: 02/01/2023  EXAM: XR CHEST 2 VIEWS DATE: 02/01/2023 12:25 AM ACCESSION: [id number] UN DICTATED: 02/01/2023 12:26 AM INTERPRETATION LOCATION: MAIN CAMPUS CLINICAL INDICATION: 37 years old Female with COUGH  TECHNIQUE: Frontal and lateral views of the chest. COMPARISON: 12/02/2022. FINDINGS: Lung: The lungs appear clear. Pleura: No pleural effusion or pneumothorax identified. Mediastinum: The cardiomediastinal silhouette appears similar to the prior exam. Bones: No evidence of acute osseous abnormality is identified.     No radiographic evidence of acute cardiopulmonary pathology.     ______________________________________________________________________  Discharge Instructions:           Other Instructions       Discharge instructions      You were admitted to [facility] at [facility] for abdominal pain, fevers, chills, nausea and vomiting. You were found to have an urinary tract infection and were initially treated with IV antibiotics but able to transition to oral antibiotics on discharge. You were also found to have a BV infection and started on a treatment course of metronidazole. We will plan to continue a long term course of the metro gel for the next 6 months to help suppress/maintain further BV infections. You had an ultrasound of your kidney and pelvic area as well, the kidney ultrasound was reassuring. The pelvic ultrasound showed a hemorrhagic cyst in your left ovary which is likely the cause of your lower abdominal  pain. This should get better with time, however, as we discussed, if this pain worsens you should present for emergent evaluation to ensure the ovary hasn't twisted.     Please START these medications:  - Keflex 500 mg every 8 hours daily for tonight and the next 2 days (last dose will be 12/25 evening)  - Metronidazole 500 mg two times daily for tonight and the next 4 days (last dose will be 12/27 evening)  - After finishing the metronidazole pills, start using the metrogel two times weekly intravaginally for the next 6 months as suppression/maintenance therapy for BV    Your follow-up appointment:   1) You have Nephrology Transplant Appt 02/19/2023   2) You have an establishing care appointment with internal medicine on 02/21/2023 at 3:00 PM at [facility] [place] with Valetta Close, MD    Please carefully read and follow these instructions below upon your discharge:    1) Please take your medications as prescribed and note the changes listed on your discharge. At future follow-up appointments, please be sure to take all of your medications with you so your provider can better guide your care.     2) Seek medical care with your primary care doctor or local Emergency Room or Urgent Care if you develop any changes in your mental status, worsening abdominal pain, fevers greater than 101.5, any unexplained/unrelieved shortness of breath, uncontrolled nausea and vomiting that keeps you from remaining hydrated or taking your medication, or any other concerning symptoms.     3) Please go to your follow-up appointments. Some of your follow-up appointments have been listed below. If you do not see an appointment listed below with your primary care doctor, please call your doctor's office as soon as possible to schedule an appointment to be seen within 7-10 days of discharge.     4) If you have any concerns before you are able to follow-up with your primary care doctor, you can reach [place] by calling [phone].                 Follow Up instructions and Outpatient Referrals     Ambulatory Referral to Ob-Gyn      Reason for referral: Recurrent BV, L ovarian hemorrhagic cyst    Discharge instructions          Appointments which have been scheduled for you      Feb 19, 2023 12:00 PM  (Arrive by 11:30 AM)  RETURN VIDEO MYCHART with Theda Sers, PhD  [facility] REGION) [address]  [place] [address]  [phone]   Please sign into My [place] Chart at least 15 minutes before your appointment to complete the eCheck-In process. You must complete eCheck-In before you can start your video visit. We also recommend testing your audio and video connection to troubleshoot any issues before your visit begins. Click ???Join Video Visit??? to complete these checks. Once you have completed eCheck-In and tested your audio and video, click ???Join Call??? to connect to your visit.     For your video visit, you will need a computer with a working camera, speaker and microphone, a smartphone, or a tablet with internet access.    My [place] Chart enables you to manage your health, send non-urgent messages to your provider, view your test results, schedule and manage appointments, and request prescription refills securely and conveniently from your computer or mobile device.    You can  go to [email or web address] to sign in to your My [place] Chart account with your username and password. If you have forgotten your username or password, please choose the ???Forgot Username???? and/or ???Forgot Password???? links to gain access. You also can access your My [place] Chart account with the free MyChart mobile app for Android or iPhone.    If you need assistance accessing your My [place] Chart account or for assistance in reaching your provider's office to reschedule or cancel your appointment, please call [facility] [phone].         Feb 21, 2023 3:00 PM  (Arrive by 2:45 PM)  NEW  RESIDENT with Valetta Close, MD  [facility] [place] [facility] REGION) [address]  [facility] 1 through 4  [place] [address]  [phone]        Mar 07, 2023 2:30 PM  (Arrive by 2:15 PM)  RETURN NEPHROLOGY POST with Posey Boyer True, MD  [facility] [place] [facility] REGION) [address]  [facility] 1 through 4  [place] [address]  [phone]             ______________________________________________________________________  Discharge Day Services:  BP 140/80  - Pulse 75  - Temp 36.8 ??C (98.2 ??F) (Oral)  - Resp 16  - Ht 165.1 cm (5' 5)  - Wt 66.8 kg (147 lb 4.8 oz)  - LMP  (LMP Unknown)  - SpO2 100%  - BMI 24.51 kg/m??   Pt seen on the day of discharge and determined appropriate for discharge.    GEN: well appearing, lying in bed, NAD  HEENT: NCAT, MMM. EOMI.  Neck: Supple.  CV: Regular rate and rhythm. Flow murmur present. No murmurs/rubs/gallops.  Pulm: CTAB. No wheezing, crackles, or rhonchi.  Abd: Flat.  Mild TTP in LLQ, otherwise nontender. No guarding, rebound.  Normoactive bowel sounds.    Neuro: A&O x 3. No focal deficits.   Ext: No peripheral edema.  Palpable distal pulses. RUE fistula present.    Condition at Discharge: good    Length of Discharge: I spent greater than 30 mins in the discharge of this patient.

## 2023-02-03 NOTE — Unmapped (Signed)
Problem: Adult Inpatient Plan of Care  Goal: Plan of Care Review  02/02/2023 1943 by Wonda Olds, RN  Outcome: Ongoing - Unchanged  02/02/2023 1943 by Wonda Olds, RN  Outcome: Ongoing - Unchanged  Goal: Patient-Specific Goal (Individualized)  02/02/2023 1943 by Wonda Olds, RN  Outcome: Ongoing - Unchanged  02/02/2023 1943 by Wonda Olds, RN  Outcome: Ongoing - Unchanged  Goal: Absence of Hospital-Acquired Illness or Injury  02/02/2023 1943 by Wonda Olds, RN  Outcome: Ongoing - Unchanged  02/02/2023 1943 by Wonda Olds, RN  Outcome: Ongoing - Unchanged  Intervention: Identify and Manage Fall Risk  Recent Flowsheet Documentation  Taken 02/02/2023 1940 by Wonda Olds, RN  Safety Interventions:   bed alarm   fall reduction program maintained   lighting adjusted for tasks/safety   low bed  Intervention: Prevent Infection  Recent Flowsheet Documentation  Taken 02/02/2023 1940 by Wonda Olds, RN  Infection Prevention:   cohorting utilized   hand hygiene promoted  Goal: Optimal Comfort and Wellbeing  02/02/2023 1943 by Wonda Olds, RN  Outcome: Ongoing - Unchanged  02/02/2023 1943 by Wonda Olds, RN  Outcome: Ongoing - Unchanged  Goal: Readiness for Transition of Care  02/02/2023 1943 by Wonda Olds, RN  Outcome: Ongoing - Unchanged  02/02/2023 1943 by Wonda Olds, RN  Outcome: Ongoing - Unchanged  Goal: Rounds/Family Conference  02/02/2023 1943 by Wonda Olds, RN  Outcome: Ongoing - Unchanged  02/02/2023 1943 by Wonda Olds, RN  Outcome: Ongoing - Unchanged     Problem: Pain Acute  Goal: Optimal Pain Control and Function  02/02/2023 1943 by Wonda Olds, RN  Outcome: Ongoing - Unchanged  02/02/2023 1943 by Wonda Olds, RN  Outcome: Ongoing - Unchanged     Problem: Fall Injury Risk  Goal: Absence of Fall and Fall-Related Injury  02/02/2023 1943 by Wonda Olds, RN  Outcome: Ongoing - Unchanged  02/02/2023 1943 by Wonda Olds, RN  Outcome: Ongoing - Unchanged  Intervention: Promote Injury-Free Environment  Recent Flowsheet Documentation  Taken 02/02/2023 1940 by Wonda Olds, RN  Safety Interventions:   bed alarm   fall reduction program maintained   lighting adjusted for tasks/safety   low bed     Problem: Wound  Goal: Optimal Coping  02/02/2023 1943 by Wonda Olds, RN  Outcome: Ongoing - Unchanged  02/02/2023 1943 by Wonda Olds, RN  Outcome: Ongoing - Unchanged  Goal: Optimal Functional Ability  02/02/2023 1943 by Wonda Olds, RN  Outcome: Ongoing - Unchanged  02/02/2023 1943 by Wonda Olds, RN  Outcome: Ongoing - Unchanged  Intervention: Optimize Functional Ability  Recent Flowsheet Documentation  Taken 02/02/2023 1940 by Wonda Olds, RN  Activity Management: up ad lib  Goal: Absence of Infection Signs and Symptoms  02/02/2023 1943 by Wonda Olds, RN  Outcome: Ongoing - Unchanged  02/02/2023 1943 by Wonda Olds, RN  Outcome: Ongoing - Unchanged  Intervention: Prevent or Manage Infection  Recent Flowsheet Documentation  Taken 02/02/2023 1940 by Wonda Olds, RN  Infection Management: aseptic technique maintained  Goal: Improved Oral Intake  02/02/2023 1943 by Wonda Olds, RN  Outcome: Ongoing - Unchanged  02/02/2023 1943 by Wonda Olds, RN  Outcome: Ongoing - Unchanged  Goal: Optimal Pain Control and Function  02/02/2023 1943 by Wonda Olds, RN  Outcome: Ongoing - Unchanged  02/02/2023 1943 by Wonda Olds, RN  Outcome: Ongoing - Unchanged  Goal: Skin Health and Integrity  02/02/2023 1943 by Wonda Olds, RN  Outcome: Ongoing - Unchanged  02/02/2023 1943 by Wonda Olds,  RN  Outcome: Ongoing - Unchanged  Intervention: Optimize Skin Protection  Recent Flowsheet Documentation  Taken 02/02/2023 1940 by Wonda Olds, RN  Activity Management: up ad lib  Goal: Optimal Wound Healing  02/02/2023 1943 by Wonda Olds, RN  Outcome: Ongoing - Unchanged  02/02/2023 1943 by Wonda Olds, RN  Outcome: Ongoing - Unchanged     Problem: Comorbidity Management  Goal: Blood Glucose Levels Within Targeted Range  02/02/2023 1943 by Wonda Olds, RN  Outcome: Ongoing - Unchanged  02/02/2023 1943 by Wonda Olds, RN  Outcome: Ongoing - Unchanged  Goal: Maintenance of Heart Failure Symptom Control  02/02/2023 1943 by Wonda Olds, RN  Outcome: Ongoing - Unchanged  02/02/2023 1943 by Wonda Olds, RN  Outcome: Ongoing - Unchanged  Goal: Blood Pressure in Desired Range  02/02/2023 1943 by Wonda Olds, RN  Outcome: Ongoing - Unchanged  02/02/2023 1943 by Wonda Olds, RN  Outcome: Ongoing - Unchanged  Goal: Bariatric Home Regimen Maintained  02/02/2023 1943 by Wonda Olds, RN  Outcome: Ongoing - Unchanged  02/02/2023 1943 by Wonda Olds, RN  Outcome: Ongoing - Unchanged

## 2023-02-04 NOTE — Unmapped (Signed)
 Thank you for taking the time in speaking with me today. I am happy to hear that you are home and doing well. I wanted to provide you with the number for the 24/7 Nurse Line.  Please reference [phone] for the 24/7 Nurse line. This is for non-emergent needs or questions.    Arlana Hove, RN

## 2023-02-08 MED ORDER — METRONIDAZOLE 0.75 % (37.5 MG/5 GRAM) VAGINAL GEL
VAGINAL | 0 refills | 0.00 days | Status: CP
Start: 2023-02-08 — End: 2023-08-07

## 2023-02-11 DIAGNOSIS — Z940 Kidney transplant status: Principal | ICD-10-CM

## 2023-02-11 MED ORDER — TACROLIMUS XR 4 MG TABLET,EXTENDED RELEASE 24 HR
ORAL_TABLET | Freq: Every day | ORAL | 11 refills | 30.00 days | Status: CP
Start: 2023-02-11 — End: 2024-02-11
  Filled 2023-02-14: qty 120, 30d supply, fill #0

## 2023-02-11 MED ORDER — AZATHIOPRINE 50 MG TABLET
ORAL_TABLET | Freq: Every day | ORAL | 11 refills | 30.00 days | Status: CP
Start: 2023-02-11 — End: 2023-02-11
  Filled 2023-02-14: qty 60, 30d supply, fill #0

## 2023-02-11 MED ORDER — TACROLIMUS XR 1 MG TABLET,EXTENDED RELEASE 24 HR
ORAL_TABLET | Freq: Every day | ORAL | 0 refills | 30.00 days | Status: CP
Start: 2023-02-11 — End: 2023-02-11

## 2023-02-11 NOTE — Unmapped (Signed)
[facility] Delivery Pharm[facility] Refill Coordination Note    Specialty Medication(s) to be Shipped:   Transplant: Envarsus Xr 1 mg, Envarsus XR 4 mg, and Azathioprine 50 mg    Other medication(s) to be shipped: No additional medications requested for fill at this time     Elaine Koch, DOB: 1985/04/10  Phone: There are no phone numbers on file.      All above HIPAA information was verified with patient.     Was a Nurse, learning disability used for this call? No    Completed refill call assessment today to schedule patient's medication shipment from the [facility]  [phone]).  All relevant notes have been reviewed.     Specialty medication(s) and dose(s) confirmed: Regimen is correct and unchanged.   Changes to medications: Ven[place]a reports no changes at this time.  Changes to insurance: No  New side effects reported not previously addressed with a pharmacist or physician: None reported  Questions for the pharmacist: No    Confirmed patient received a Conservation officer, historic buildings and a Surveyor, mining with first shipment. The patient will receive a drug information handout for each medication shipped and additional FDA Medication Guides as required.       DISEASE/MEDICATION-SPECIFIC INFORMATION        N/A    SPECIALTY MEDICATION ADHERENCE     Medication Adherence    Patient reported X missed doses in the last month: 1-2  Specialty Medication: azathioprine 50 mg tablet  Patient is on additional specialty medications: Yes  Additional Specialty Medications: ENVARSUS XR 1 mg   Patient Reported Additional Medication X Missed Doses in the Last Month: 1-2  Patient is on more than two specialty medications: Yes  Specialty Medication: ENVARSUS XR 4 mg  Patient Reported Additional Medication X Missed Doses in the Last Month: 1-2              Were doses missed due to medication being on hold? No    Envarsus XR 1 mg: 0 days of medicine on hand   Envarsus XR 4 mg: 0 days of medicine on hand Azathioprine 50 mg: 0 days of medicine on hand        REFERRAL TO PHARMACIST     Referral to the pharmacist: Not needed      [facility]     Shipping address confirmed in Epic.       Delivery Scheduled: Yes, Expected medication delivery date: 02/14/23.  However, Rx request for refills was sent to the provider as there are none remaining.     Medication will be delivered via UPS to the prescription address in Epic WAM.    Willette Pa   [facility]  Specialty Technician

## 2023-02-14 MED FILL — ENVARSUS XR 1 MG TABLET,EXTENDED RELEASE: ORAL | 30 days supply | Qty: 60 | Fill #0

## 2023-02-19 ENCOUNTER — Encounter: Admit: 2023-02-19 | Discharge: 2023-02-20 | Payer: MEDICARE | Attending: Clinical | Primary: Clinical

## 2023-02-19 NOTE — Unmapped (Unsigned)
Confidential Psychological Therapy Session  [facility] for Transplant Care     Patient Name: Elaine Koch  Medical Record Number: [id number]  Date of Service: February 19, 2023  Clinical Psychologist: Artemio Aly, PhD  Intern: None  Time Spent: 29 min of face-to-face counseling  CPT Procedure Code: 91478 (30 min psychotherapy with patient and/or family)  Therapy Type: Behavior Modifying/Cognitive Behavioral Therapy (CBT)  Purpose of Treatment: assess current MH needs, reduce depression symptoms, reduce anxiety symptoms, improve quality of life, improve coping, adjustment to chronic medical illness, safety planning     *This patient was not seen in person to minimize potential spread of COVID-19, protect patients/family/providers, and reduce PPE utilization. During this time, transplant psychology will be limiting person-to-person contact when possible.*       The patient reports they are physically located in [place] and is currently: not at home (at work at [employer], located in [place]). I conducted a audio/video visit. I spent 3m 36s on the video call with the patient. I spent an additional 30 minutes on pre- and post-visit activities on the date of service. Neither patient nor provider were located on-site.      The limits of confidentiality and the purpose of the evaluation were reviewed. The patient was provided with a verbal description of the nature and purpose of the psychological evaluation. I also reviewed the referral source, specific referral question for this evaluation, foreseeable risks/discomforts, benefits, limits of confidentiality, and mandatory reporting requirements of this provider. The patient was given the opportunity to ask questions and receive answers about the present evaluation. Oral consent was provided by the patient.      This evaluation note may contain sensitive and confidential information regarding the patient???s psychosocial adjustment to living with a chronic medical condition. DO NOT share this information outside [facility] without written consent from the patient explicitly stating that mental health records may be released.      Referral/Relevant History:  Ms.  Koch is a very pleasant 38 y.o. female who presents for cognitive behavioral therapy to clarify current MH needs and address symptoms of depression/anxiety that were recently reported during her most recent post-transplant nephrology follow-up with Dr. Lucienne Minks True on 12/13/22, primarily in context of recent pregnancy loss. Elaine Koch has previously been referred for similar concerns in April 2024 and had been working with Clinical research associate until July 2024 (missed an appt in August after starting new job).  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      Elaine Koch met with writer initially on 07/12/22, when she endorsed symptoms consistent with PTSD, MDD Recurrent, and panic attacks, as well as frequent passive SI. She was engaged with a PHP through [facility] which had been beneficial, but as this was only a 21-day program, Clinical research associate agreed to continue to follow her for support until she is able to connect with a consistent, long-term provider; this nearly happened at [facility] but she was unable to attend that appt and never established outpatient MH care. She was seen today for her 7th appt with Clinical research associate.     Review of Symptoms/ROS: Deferred     Subjective:   Elaine Koch reported that she feels like she is doing well overall and feels lighter than she has recently. She has been spending more time with family, including over the holidays. She has felt less stress while traveling in traffic, and when asked how she has been doing this, she talked about relying on religious faith and  trying to distract herself. Elaine Koch did endorse ongoing stress in several areas, including her relationship (both of them working more, spending less time together), work stress, and financial strain (recently moved, had to buy some new appliances). Overall, though, she continues to feel way better.     Time was spent today reviewing coping strategies previously discussed, including behavioral activation and reducing avoidance. While she has not attended church yet, she has purposefully reached out to family for extra support and received it from them, which she is grateful for. We also engaged in treatment planning, as pt has not reached out to [facility] yet and was encouraged to do so. Given progress, we agreed to check in again in about a month to re-assess progress.      Objective:   Mental Status Exam:  Appearance: Appears stated age and Clean/Neat.  Motor: No abnormal movements  Speech/Language: Normal rate, volume, tone, fluency  Mood: Lighter  Affect: Somewhat flat, but mostly Mood congruent  Thought Process: Logical, linear, clear, coherent, goal directed  Thought Content: Suicidal Ideation, passive, denies plan/intent; denies HI or A/VH  Perceptual Disturbances: Denies auditory and visual hallucinations, behavior not concerning for response to internal stimuli  Orientation: Oriented to person, place, time, and general circumstances  Attention: Able to fully attend without fluctuations in consciousness  Concentration: Able to fully concentrate and attend  Memory: Immediate, short-term, long-term, and recall grossly intact  Fund of Knowledge: Consistent with level of education and development  Insight: Fair  Judgment: Fair  Impulse Control: Fair        Assessment:  Ms.  Koch participated well in this CBT session and exhibits good motivation towards treatment goals. Today she reported some improvements in symptoms of depression and anxiety, which she attributes to relying more on family support and trying not to less stressful situations bother her as much. She continues to use some effective coping strategies, in particular using behavioral activation to reduce avoidance and spend less time in bed, which is likely a key contributor to improved mood reported today.      Taken altogether, Elaine Koch appears to be experiencing moderate symptoms of depression and anxiety at this time, though with good engagement in using CBT-oriented skills, she seems to be utilizing more effective coping strategies lately and has reported some improvements in mood compared to earlier this year. She would benefit from consistent, active engagement in [facility] care to treat these symptoms, but work schedule and finances are barriers to this. She was amenable to monthly check-ins with Clinical research associate for now, and we will continue to explore other treatment options. She was again encouraged to follow up with the [facility] and she was given information for how to contact them to schedule an appointment.      Focus on current treatment is assessing current MH needs, safety planning, treatment planning.      Focus on future sessions will include ongoing assessment and safety planning, introducing and practicing CBT-oriented coping strategies.     Adherence concerns: None reported today, though previous nephrology notes suggest some issues with obtaining labs consistently and disjointed care (more ED visits than outpatient nephrology follow-up).     Diagnostic Impression: Posttraumatic Stress Disorder; Major Depressive Disorder, Severe, Recurrent; Unspecified Anxiety Disorder (panic attacks); R/O Bipolar Disorder        Risk Assessment:  A suicide and violence risk assessment was performed as part of this evaluation. The patient is deemed to be at chronic elevated  risk for self-harm/suicide given the following factors: recent bereavement, recent trauma, current diagnosis of depression, hopelessness, previous acts of self-harm, suicidal ideation or threats without a plan, chronic severe medical condition, chronic mental illness > 5 years, and past diagnosis of depression. The patient is deemed to be at chronic elevated risk for violence given the following factors: recent loss. These risk factors are mitigated by the following factors: no known access to weapons or firearms, no history of violence, motivation for treatment, supportive family, sense of responsibility to family and social supports, minor children living at home, presence of an available support system, employment or functioning in a structured work/academic setting, religious or spiritual prohibition to suicide/violence, and safe housing. There is no acute risk for suicide or violence at this time. The patient was educated about relevant modifiable risk factors including following recommendations for treatment of psychiatric illness and abstaining from substance abuse.               While future psychiatric events cannot be accurately predicted, the patient does not currently require  acute inpatient psychiatric care and does not currently meet [facility] involuntary commitment criteria.               Psychometric Testing: None administered today.          Plan:  Writer will attempt to meet with Elaine Koch as a bridge until she can establish consistent MH care, though work and finances have been a barrier to this and she was lost to follow-up with this Clinical research associate previously. Specific skills she was encouraged to practice include engagement in values-based behavior (e.g. spending more time with family, attending church). She was encouraged to reach out to [facility] as a potential next step for establishing care.     Ms.  Koch will return Wednesday 03/19/23 at 12:00pm via video.       Ms.  Koch was given this writer's contact information with confidential voice mail number and instructed to call 911 for emergencies. include engagement in values-based behavior (e.g. spending more time with family, attending church).      Ms.  Koch will return Wednesday 02/19/23 at 12:00pm via video.       Ms.  Koch was given this writer's contact information with confidential voice mail number and instructed to call 911 for emergencies.

## 2023-02-24 DIAGNOSIS — Z79899 Other long term (current) drug therapy: Principal | ICD-10-CM

## 2023-02-24 DIAGNOSIS — Z940 Kidney transplant status: Principal | ICD-10-CM

## 2023-02-28 NOTE — Unmapped (Signed)
Moving enrollment under transplant

## 2023-03-03 DIAGNOSIS — Z940 Kidney transplant status: Principal | ICD-10-CM

## 2023-03-05 MED ORDER — METRONIDAZOLE 0.75 % (37.5 MG/5 GRAM) VAGINAL GEL
VAGINAL | 6 refills | 0.00 days | Status: CP
Start: 2023-03-05 — End: 2023-08-02

## 2023-03-07 ENCOUNTER — Ambulatory Visit: Admit: 2023-03-07 | Discharge: 2023-03-08 | Payer: MEDICARE | Attending: Nephrology | Primary: Nephrology

## 2023-03-07 NOTE — Unmapped (Signed)
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 As long as she is stable, would just follow for now. Her DSAs were slightly improved on screen from 12/13/22.    With regards to her pancreas transplant, her amylase had been chronically elevated, but was normal 12/13/22.     She did not get labs today since she had already taken her Envarsus. She has promised to get labs locally.    # Immunosuppression  Lab Results   Component Value Date    TACROLIMUS 5.3 02/03/2023     Tacrolimus: Her tacrolimus level was not drawn today. Given her pancreas transplant, her goal is 8-10. Will continue current dose and follow up next level.  Azathioprine 100 mg daily. Not on MMF due to ongoing desire for pregnancy.  Prednisone free.    # Leukopenia  White blood cell count has been low, will follow up repeat labs. May need some Granix.    # Pregnancy loss  # Bacterial vaginosis  Vaginal bleeding has resolved, will follow up with GYN regarding BV.    # Depression/anxiety  Stable on lexapro, she has been working with Dr. Neale Burly with plans to establish with mental health locally.    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     # Metabolic acidosis  Lab Results   Component Value Date    CO2 23.7 02/03/2023     Stable on sodium bicarbonate 1300 mg twice daily.    # BP management  BP: 144/78 in clinic today. She has not required antihypertensives.    # Infectious disease  CMV PCR:   Lab Results   Component Value Date    CMVLR Not Detected 12/13/2022     BK PCR:   Lab Results   Component Value Date    BBKQR Not Detected 12/13/2022     EBV PCR: neg 05/30/22     # Anemia screening  Lab Results   Component Value Date    HGB 7.0 (L) 02/03/2023     Lab Results   Component Value Date    IRON 124 12/13/2022    TIBC 265 12/13/2022    LABIRON 47 12/13/2022     She has been persistently anemic, likely from CKD and azathioprine. She may need epogen supplementation. Will see how her Hgb looks.    # Cardiovascular  The ASCVD Risk score (Arnett DK, et al., 2019) failed to calculate..  Lab Results   Component Value Date    CHOL 87 12/13/2022    HDL 50 12/13/2022    LDL 30 (L) 12/13/2022     Lab Results   Component Value Date    TRIG 36 12/13/2022     Lipid panel at goal, continue  to monitor .    # Bone and mineral metabolism  Lab Results   Component Value  Date    CALCIUM 9.2 02/03/2023      Lab Results   Component Value Date    PHOS 4.0 01/06/2023     Lab Results   Component Value Date    PTH 211.9 (H) 12/13/2022     Stable, no changes.    # Electrolytes  Lab Results   Component Value Date    MG 1.6 01/06/2023     Lab Results   Component Value Date    K 4.8 02/03/2023     Stable, no changes.    # Immunizations  Last Covid Booster: Completed - Date: 03/23/20  Flu Shot: Completed - Date: 12/21/21  Pneumococcal: Prevnar-20 on 12/13/22  Shingrix: Needed age 29    # Cancer screening  PAP smear: completed - date: 02/06/21  Mammogram: N/A  Colonoscopy: N/A  Skin: recommend yearly dermatology evaluation  Renal US: completed - date: 04/24/22  CXR: completed - date: 12/12/22    # Childbearing age  She is not trying actively to prevent pregnancy. Is on azathioprine for that reason.    Most pre-menopausal women experience return of fertility post-transplant, and a safe pregnancy is possible with careful planning. Recommend using abstinence or a highly effective contraception to avoid pregnancy until planning pregnancy, and if planning pregnancy please notify nephrology so that we can adjust medications to avoid teratogens.    # Health maintenance  Made referral for PCP at [facility], she declined when they called to schedule.    There are no Patient Instructions on file for this visit.      Return in about 4 months (around 07/05/2023).    ___________________________________________________________________      Kidney Transplant History:   Date of Transplant: 05/03/2019 (Kidney / Pancreas)  Type of Transplant: Deceased  KDPI: 34%  Native Kidney Disease: Type 1 Diabetes  Prior Transplants: None    Biopsies:   Zero-Hour Biopsy: Slim biopsy core without characteristic and diagnostic abnormalities.  03/02/2021: Minor morphologic changes.  08/16/2022: Limited cortex available for evaluation; Mild active antibody mediated rejection; Mild glomerulitis; Mild peritubular capillaritis; 1+ C4d peritubular capillary staining (<5%); Banff Score (2919): i0, t0, v0, g1, ptc1, C4d1, ci0, ct0, cv3, cg0, ptcml0, ti0, i-IFTA0, t-IFTA0, pvl0.    Donor Specific Antibodies:   To C17, first on 11/26/19 at 1226, peak 3268 on 10/11/21, 1376 on 08/01/22, 1124 on 12/13/22.  To DR51, first/peak on 06/07/20 at 1330, 1041 on 08/01/22 not present 12/13/22.    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 Had some low pressures as an outpatient, blood pressure meds held and was on midodrine for a time. Transitioned to Envarsus given high tacrolimus requirement.  - Admitted 6/1-07/16/19 with HA/N/V/D. Had AKI with tacrolimus level of 27.9. Had IVF, found to be C.diff positive and started on vancomycin.   - On 07/20/19 noted to have wbc < 0.6, Myfortic and Valcyte held, given Granix. Up to 3.9 on 07/26/19, Myfortic restarted at 180 mg BID (was 720 mg prior to holding).  - Diarrhea returned on 07/28/19, treated again with vanc for 14 days.  - Continued to have diarrhea, referred for FMT. Had been treated with fidaxomicin and bezlotoxumab.  - Admitted 8/20-8/22/21 for Enterococcal UTI.  - Had fecal transplant 02/14/20. Called 02/17/20 feeling poorly, admitted 1/6-1/11/22  with AKI and elevated tac level. Had proteus in urine and blood, was Covid positive as well. Treated with cefipime then then cipro, also get remdesivir. Myfortic and Valcyte held on discharge for leukopenia. Myfortic restarted 03/03/20.  - She unfortunately had recurrence of her diarrhea which did not respond to further oral therapy, had repeat FMT on 06/19/20.  - At her visit 09/01/20 she was interested in pursuing pregnancy but knew that she could not be on Myfortic. Planned to have her see MFM for a consult prior to transitioning from Myfortic to azathioprine. She called 10/30/20 to report a positive pregnancy test. We stopped Myfortic and started azathioprine. She was seen in the ED 11/02/20 for vaginal bleeding, was seen by OB and felt to most likely be experiencing a miscarriage. Beta HCG was 14,771. On 11/08/20 was having ongoing bleeding and clots. She presented again to [facility] 11/17/20 with lower abdominal pain and bleeding. US showed no clear IUP and bHCG was down to 7672. Underwent D&C and subsequent diagnostic laparoscopy to rule out ectopic pregnancy, fallopian tubes were normal. Hgb dropped from 11 to 9. Creatinine remained at baseline. She was to get follow up bHCG following D&C but they were unable to reach her to get that done.  Micah Flesher to ED at [facility] 01/29/21 where she was found to have a bHCG of 190,000. US showed pregnancy at [redacted]w[redacted]d. She was able to tolerate po so was discharged home, creatinine was 1.53. She was seen by MFM 02/06/21. She did not have any labs between 01/29/21 and 02/23/21, creatinine from 1.53 to 1.73. Repeated labs on 02/28/21 and creatinine remained mildly elevated from baseline at 1.65. Since it was still early in pregnancy, we decided to biopsy her to rule out rejection before she was too progressed in her pregnancy. She underwent biopsy 03/02/21 that showed only minor morphological changes, no rejection.  -  She saw MFM and Dr. Thad Ranger 03/06/21 with plans to start epogen for low Hgb. Wanted to continue the pregnancy at that time, however called 03/12/21 asking to proceed with termination, due to concerns for affects of pregnancy on her kidney as well as having delivered previous pregnancy at 27 weeks. Underwent uterine vacuum aspiration on 03/20/21.   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 Was not trying to prevent pregnancy, was open to conceiving.  - Seen locally 11/22/21 for URI symptoms, Covid and strep negative. Not tested for RSV or flu that I can see. Recommended symptomatic treatment. Seen again 01/07/22 and 01/10/22 for ongoing cough, felt from GERD so protonix started.  - Noted to have wbc 1.9 on 01/09/22, planned for Granix at her appointment 01/11/22, however she cancelled that appointment stating that she was too sick to come to clinic. She ultimately had a dose on 01/24/22.  - Was seen in the ED 02/01/22 with 3 days of N/V. Given IVF and treated for possible UTI with cefdinir. Urine culture grew 50-100K CNS, GC and chlamydia negative, pregnancy negative.  - She did not connect for video visits 03/21/22 or 04/03/22.  - Presented to [facility] ED 04/06/22 with N/V. Treated for presumed pneumonia with amoxicillin, given Zofran. Symptoms improved but she experienced diarrhea and presented to ED 04/24/22 for concerns of C.diff. Renal US and CT abdomen unremarkable. She did not have a BM while in the ED for ~ 12 hours so discharged with a stool kit to collect a specimen. Seen at [facility] ED 05/03/22 for ongoing  diarrhea, had not submitted stool sample. There a rapid C.diff was negative, given IVF and discharged on Bentyl and Imodium.   - Called on call coordinator 05/04/22 complaining of ongoing abdominal cramping and diarrhea, somewhat frustrated that no one could explain her symptoms. Placed e-consult for GI, when they reached out for a history, they were unable to reach her.  - Her coordinator reached out 05/06/22 and patient stated diarrhea was better and she was not needing Imodium. Was offered an appointment with me, patient declined and preferred to see GI locally. Called 05/14/22 stating she was having extreme stomach cramps. Again declined appointment with me, asked to be admitted. Was advised to come to ED.  - Admitted from 4/2-05/16/22. Noted that on 05/03/22 at [facility], her stool sample was positive for enteropathogenic E.coli. Was started on IV Cipro empirically, but repeat GIPP returned negative. She did not have further diarrhea while in house. GI referral made. Was also treated with Flagyl for BV. Noted to have new eosinophilia, ddx includes eosinophilic colitis from azathioprine.  - At her visit 05/30/22 she continued to have GI symptoms. She felt her symptoms were good for a while after her second fecal transplant, but around the end of February/early March she was put on antibiotics for pneumonia at [facility] and had GI symptoms ever since. Noted a lot of abdominal bloating. Would have 8-10 loose bowel movements at night about 1-2 times a week. Had pretty extreme abdominal cramping and burning, last 1-1.5 hours, did not change with eating or having a bowel movement. She reported that prior to kidney transplant, she had some irritable bowel symptoms, diarrhea, and sensitivity to certain foods.  - Was seen in the ED locally on 07/02/2022 to try to get established with outpatient mental health services. She reported over the previous 2 to 3 weeks an increase in her depression and anxiety. She was finding it difficult to go to work and was restless, fatigued with decreased appetite and decreased motivation. She denied suicidal ideation or plans. She was discharged with plans to follow-up with outpatient services. Our transplant psychologist contacted her 07/03/2022 and assisted her with some resources on finding mental health providers in her area as well as setting up an appointment with her on 07/12/2022. At that visit she reported that she had been engaging in group therapy through [facility] health and that was really helpful for her.  She had also started Prozac. She met with our psychologist again this morning.  - At her visit 08/01/22 her GI symptoms were improved. She had switched from Prozac to lexapro with improvement in her mood, but more daytime fatigue. Had stopped working to focus on her health.  - Her creatinine had been running 1.5-1.7, early in 2024 started to rise into the range of 1.8-2.1. On 08/01/22 was up to 2.4, so planned for renal biopsy. Underwent biopsy in [place] 08/08/22 complicated by bleeding, were unable to get an adequate specimen.she was admitted and the biopsy was subsequently done in vascular radiology. That showed mild active antibody mediated rejection, C4d positive, with peritubular capillaritis and glomerulitis. Plan was for Solu-Medrol 500 mg IV for 3 days, IVIG 1 g/kg for 2 days and 1 g of rituximab. Unfortunately, she did not tolerate IVIG due to diffuse myalgias, even with premedication. She did fine with rituximab. She was discharged with Bactrim and Valcyte. Her creatinine was 2.31 at the time of discharge.  - She did meet with Artemio Aly, transplant psychologist on 08/07/22, was a no-show for her  visit with him on 08/23/22.  Did connect on 08/26/22, was a no-show on 10/09/22.  - Got dose of Aranesp as an outpatient on 08/30/22.  - Had a telemedicine visit with gastroenterology on 10/23/22, reported intermittent abdominal symptoms with watery diarrhea, abdominal pain and bloating. It would occur for a couple of days and then stop completely. Happened every couple of weeks. 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 difficile recurrence, microscopic colitis or medication induced colitis, celiac or alpha gal. Recommended stool testing and blood work. Also recommended L-glutamine supplement 5 g 3 times daily and Imodium as needed.  - Called her coordinator 11/03/22 reporting positive home pregnancy test. She was also having some vaginal spotting and diarrhea. She presented to the ED for evaluation. She had an ultrasound that demonstrated intrauterine pregnancy with findings concerning for early pregnancy loss. OB was consulted by phone who did not have additional recommendations and that she should follow-up for repeat ultrasound in 10 days. She saw them on 11/18/22 where ultrasound showed nonviable first trimester pregnancy. There was no fetal heart rate and no yolk sac. They discussed treatment options, and patient expressed desire for D&C. Underwent the procedure on 11/19/22. She called the on-call coordinator on 11/24/22 noted abdominal cramping and bleeding. The coordinator paged the on-call for GYN to contact the patient. On 12/02/22 she was noted to have a hemoglobin of 6.6, down from 8.1 on 11/19/2022. We contacted the patient and she reported fatigue and a terrible headache. She was referred to the emergency department. She was no longer having vaginal bleeding at that time, hemoglobin 7.1 there. She received 1 unit of packed red blood cells and was discharged.  - She was seen at the ED at [facility] on 12/12/22 with low back pain and bilateral leg pain for 2 days. Also had a headache. She had a white blood cell count of 1.8, hemoglobin 7.9, creatinine 2.02. COVID/flu/RSV were negative. Was noted to have a pregnancy test that was still positive, with quantitative hCG of 482.  - She canceled appointments with me on 7/26 and 8/16, was a no-show on 10/25/22.    History of Present Illness:     Since the last visit 12/13/2022:  - At visit 12/13/22 was only having vaginal spotting. Noted frontal headaches daily, between 8 am and 1 pm. Was using Benadryl, Flonase and Tylenol to manage. Continued to have chronic abdominal symptoms of mostly diarrhea and bloating. Had not done labs or stool studies GI had requested, or started L-glutamine. Was not on Lexapro, had not been able to get a refill so I sent it in.  - [facility] ED 12/24/22 with 2 weeks of intermittent vaginal bleeding, worsening over the previous weekend. Transvaginal US showed Complex heterogeneous focus in the uterine fundus contiguous with the endometrium, new since September may represent hemorrhage or retained products of conception. Got ceftriaxone, given azithromycin and Flagyl with plans for close follow up with GYN. Had video visit with them 12/25/22, denied bleeding at that time, planned follow up HCG the next day. Finally got on 12/30/22, was 27.1 from 52.3, wanted repeat in one week. Seen 01/06/23 and was down to 15.7. She reported ongoing vaginal discharge in spite of treatment for BV. Collected swab and metrogel called in 01/15/23.   - Transplant psychology 11/19 and 01/22/23, 02/19/23 with plans to treat her until she was able to transition to local mental health care. At visit 02/19/23 had not yet reached out to [facility]  psychiatry.  - ED 01/31/23 with 2 days of F/C, cough, body aches, lower abdominal pain, dysuria. Temp there to 101.7. Admitted 12/20 - 02/03/23 and treated for possible PID with dose ceftriaxone, flagyl for BV and Keflex for UTI (grew 50-100K group b Strep). Creatinine up to 2.31, was 2.1 on discharge.    In clinic today:  - She has not had any further vaginal bleeding.   - Is struggling with ongoing BV symptoms.  - Some non-productive cough, controlled with tussin. No fever/ST/SOB.    Last dose of tacrolimus: 9 am    Concerns about nonadherence: In the past, better recently, does cancel and reschedule appointments frequently.    Social: She lives in [place]. Has a son born in 30. 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    Review of Systems:   All other systems negative, except as documented in the HPI.    Physical Exam:    BP 144/78 (BP Site: L Arm, BP Position: Sitting, BP Cuff Size: Small)  - Pulse 80  - Temp 36.1 ??C (97 ??F) (Temporal)  - Resp 18  - Ht 165.1 cm (5' 5) Comment: reported - Wt 67.3 kg (148 lb 6.4 oz)  - LMP 02/08/2023  - Breastfeeding Unknown  - BMI 24.70 kg/m??   General: Patient is a pleasant female in no apparent distress.  Eyes: Sclera anicteric.  ENT: Oropharynx without lesions.   Neck: Supple without LAD/JVD/bruits.  Lungs: Clear to auscultation bilaterally, no wheezes/rales/rhonchi.  Cardiovascular: Regular rate and rhythm without murmurs, rubs or gallops.  Abdomen: Soft, notender/nondistended. Positive bowel sounds. No tenderness over the graft.  Extremities: Without edema, joints without evidence of synovitis.  Skin: Without rash.  Neurological: Grossly nonfocal.  Psychiatric:  Somewhat flat affect .      Allergies:   Allergies   Allergen Reactions    Iodinated Contrast Media Swelling, Rash and Other (See Comments)     Burning, warmth throughout body and tingling.  Throat swelling.  Treated with benadryl and symptoms improved.  Has not had contrast since then (2013 or 2014).    Burning    Nickel Rash    Privigen [Immun Glob G(Igg)-Pro-Iga 0-50] Muscle Pain     PRIVIGEN brand: back and leg pain at usual rate of infusion during first dose, then full body pain again when rechallenged at slower rate of 30 mL/hr    Propranolol Other (See Comments) and Swelling    Eye Irrigating Solution [Ophthalmic Irrigation Solution] Other (See Comments)     Contrast dye (name?) used in eyes caused hot feeling in face, reversed with Benadryl.     Iodine      Other reaction(s): Skin Rash    Naltrexone Rash    Uni-Cortrom Rash        Current Medications:   Current Outpatient Medications   Medication Sig Dispense Refill acetaminophen (TYLENOL) 325 MG tablet Take 2 tablets (650 mg total) by mouth every four (4) hours as needed for pain. 100 tablet 2    azathioprine (IMURAN) 50 mg tablet Take 2 tablets (100 mg total) by mouth daily 60 tablet 11    butalbital-acetaminophen-caffeine (ESGIC) 50-325-40 mg per tablet Take 1 tablet by mouth every four (4) hours as needed for pain. 30 tablet 2    escitalopram oxalate (LEXAPRO) 5 MG tablet Take 1 tablet (5 mg total) by mouth daily. 90 tablet  3    metroNIDAZOLE (METROGEL) 0.75 % (37.5mg /5 gram) vaginal gel Insert into the vagina Two (2) times a week. Use for the next 6 months as suppression treatment for BV 70 g 0    metroNIDAZOLE (METROGEL) 0.75 % (37.5mg /5 gram) vaginal gel Insert into the vagina Two (2) times a week. 70 g 6    sodium bicarbonate 650 mg tablet Take 2 tablets (1,300 mg total) by mouth Three (3) times a day. 180 tablet 3    tacrolimus (ENVARSUS XR) 1 mg Tb24 extended release tablet Take 2 tablets (2 mg total) by mouth daily with FOUR 4mg  tablets for total daily dose of 18mg  60 tablet 0    tacrolimus (ENVARSUS XR) 4 mg Tb24 extended release tablet Take 4 tablets (16 mg total) by mouth daily. Take with two 1 mg tablets for a total of 18 mg 120 tablet 11     No current facility-administered medications for this visit.         Laboratory Studies:  No results found for this or any previous visit (from the past 170 hours).            Electronically signed by:   Drinda Butts, MD  [facility]      I personally spent 46 minutes face-to-face and non-face-to-face in the care of this patient, which includes all pre, intra and post visit time on the date of service.

## 2023-03-13 DIAGNOSIS — Z940 Kidney transplant status: Principal | ICD-10-CM

## 2023-03-13 MED ORDER — TACROLIMUS XR 1 MG TABLET,EXTENDED RELEASE 24 HR
ORAL_TABLET | Freq: Every day | ORAL | 0 refills | 30.00 days | Status: CP
Start: 2023-03-13 — End: ?
  Filled 2023-03-14: qty 60, 30d supply, fill #0

## 2023-03-13 NOTE — Unmapped (Signed)
1/30: patient is aware [facility] now requires credit card on file for all meds with prices/copays -[facility] Clinical Assessment & Refill Coordination Note    Elaine Koch, DOB: [birth year]  Phone: There are no phone numbers on file.    All above HIPAA information was verified with patient.     Was a Nurse, learning disability used for this call? No    Specialty Medication(s):   Transplant: Envarsus 1mg , Envarsus 4mg , and azathioprine 50mg      Current Outpatient Medications   Medication Sig Dispense Refill    acetaminophen (TYLENOL) 325 MG tablet Take 2 tablets (650 mg total) by mouth every four (4) hours as needed for pain. 100 tablet 2    azathioprine (IMURAN) 50 mg tablet Take 2 tablets (100 mg total) by mouth daily 60 tablet 11    butalbital-acetaminophen-caffeine (ESGIC) 50-325-40 mg per tablet Take 1 tablet by mouth every four (4) hours as needed for pain. 30 tablet 2    escitalopram oxalate (LEXAPRO) 5 MG tablet Take 1 tablet (5 mg total) by mouth daily. 90 tablet 3    metroNIDAZOLE (METROGEL) 0.75 % (37.5mg /5 gram) vaginal gel Insert into the vagina Two (2) times a week. Use for the next 6 months as suppression treatment for BV 70 g 0    metroNIDAZOLE (METROGEL) 0.75 % (37.5mg /5 gram) vaginal gel Insert into the vagina Two (2) times a week. 70 g 6    sodium bicarbonate 650 mg tablet Take 2 tablets (1,300 mg total) by mouth Three (3) times a day. 180 tablet 3    tacrolimus (ENVARSUS XR) 1 mg Tb24 extended release tablet Take 2 tablets (2 mg total) by mouth daily with FOUR 4mg  tablets for total daily dose of 18mg  60 tablet 0    tacrolimus (ENVARSUS XR) 4 mg Tb24 extended release tablet Take 4 tablets (16 mg total) by mouth daily. Take with two 1 mg tablets for a total of 18 mg 120 tablet 11     No current facility-administered medications for this visit.        Changes to medications: Elaine Koch reports no changes at this time.    Medication list has been reviewed and updated in Epic: yes    Allergies   Allergen Reactions    Iodinated Contrast Media Swelling, Rash and Other (See Comments)     Burning, warmth throughout body and tingling.  Throat swelling.  Treated with benadryl and symptoms improved.  Has not had contrast since then (2013 or 2014).    Burning    Nickel Rash    Privigen [Immun Glob G(Igg)-Pro-Iga 0-50] Muscle Pain     PRIVIGEN brand: back and leg pain at usual rate of infusion during first dose, then full body pain again when rechallenged at slower rate of 30 mL/hr    Propranolol Other (See Comments) and Swelling    Eye Irrigating Solution [Ophthalmic Irrigation Solution] Other (See Comments)     Contrast dye (name?) used in eyes caused hot feeling in face, reversed with Benadryl.     Iodine      Other reaction(s): Skin Rash    Naltrexone Rash    Uni-Cortrom Rash       Changes to allergies: No    Allergies have been reviewed and updated in Epic: Yes    SPECIALTY MEDICATION ADHERENCE     Envarsus 1mg   : 7 days of medicine on hand   Envarsus 4mg   : 6 days  of medicine on hand   Azathioprine 50mg   : 7 days of medicine on hand       Medication Adherence    Patient reported X missed doses in the last month: 0  Specialty Medication: azathioprine 50mg   Patient is on additional specialty medications: Yes  Additional Specialty Medications: Envarsus 1mg   Patient Reported Additional Medication X Missed Doses in the Last Month: 0  Patient is on more than two specialty medications: Yes  Specialty Medication: envarsus 4mg   Patient Reported Additional Medication X Missed Doses in the Last Month: 0          Specialty medication(s) dose(s) confirmed: Regimen is correct and unchanged.     Are there any concerns with adherence? No    Adherence counseling provided? Not needed    CLINICAL MANAGEMENT AND INTERVENTION      Clinical Benefit Assessment:    Do you feel the medicine is effective or helping your condition? Yes    Clinical Benefit counseling provided? Not needed    Adverse Effects Assessment:    Are you experiencing any side effects? No    Are you experiencing difficulty administering your medicine? No    Quality of Life Assessment:    Quality of Life    Rheumatology  Oncology  Dermatology  Cystic Fibrosis          How many days over the past month did your transplant  keep you from your normal activities? For example, brushing your teeth or getting up in the morning. 0    Have you discussed this with your provider? Not needed    Acute Infection Status:    Acute infections noted within Epic:  No active infections  Patient reported infection: None    Therapy Appropriateness:    Is therapy appropriate based on current medication list, adverse reactions, adherence, clinical benefit and progress toward achieving therapeutic goals? Yes, therapy is appropriate and should be continued     DISEASE/MEDICATION-SPECIFIC INFORMATION      N/A    Solid Organ Transplant: Not Applicable    PATIENT SPECIFIC NEEDS     Does the patient have any physical, cognitive, or cultural barriers? No    Is the patient high risk? No    Did the patient require a clinical intervention? No    Does the patient require physician intervention or other additional services (i.e., nutrition, smoking cessation, social work)? No    Does the patient have an additional or emergency contact listed in their chart? Yes    SOCIAL DETERMINANTS OF HEALTH     At the [facility], we have learned that life circumstances - like trouble affording food, housing, utilities, or transportation can affect the health of many of our patients.   That is why we wanted to ask: are you currently experiencing any life circumstances that are negatively impacting your health and/or quality of life? Patient declined to answer    Social Drivers of Health     Food Insecurity: No Food Insecurity (08/16/2022)    Hunger Vital Sign     Worried About Running Out of Food in the Last Year: Never true     Ran Out of Food in the Last Year: Never true   Internet Connectivity: Not on file   Housing/Utilities: Low Risk  (08/16/2022)    Housing/Utilities     Within the past 12 months, have you ever stayed: outside, in a car, in a tent, in an overnight shelter, or temporarily in someone else's  home (i.e. couch-surfing)?: No     Are you worried about losing your housing?: No     Within the past 12 months, have you been unable to get utilities (heat, electricity) when it was really needed?: No   Tobacco Use: Low Risk  (03/07/2023)    Patient History     Smoking Tobacco Use: Never     Smokeless Tobacco Use: Never     Passive Exposure: Not on file   Transportation Needs: No Transportation Needs (08/16/2022)    PRAPARE - Transportation     Lack of Transportation (Medical): No     Lack of Transportation (Non-Medical): No   Alcohol Use: Not on file   Interpersonal Safety: Not on file   Physical Activity: Insufficiently Active (04/20/2019)    Exercise Vital Sign     Days of Exercise per Week: 2 days     Minutes of Exercise per Session: 30 min   Intimate Partner Violence: Unknown (04/20/2019)    Humiliation, Afraid, Rape, and Kick questionnaire     Fear of Current or Ex-Partner: Patient declined     Emotionally Abused: Patient declined     Physically Abused: Patient declined     Sexually Abused: Patient declined   Stress: No Stress Concern Present (04/20/2019)    [employer] of Occupational Health - Occupational Stress Questionnaire     Feeling of Stress : Only a little   Substance Use: Not on file (12/16/2022)   Social Connections: Unknown (04/20/2019)    Social Connection and Isolation Panel [NHANES]     Frequency of Communication with Friends and Family: Patient declined     Frequency of Social Gatherings with Friends and Family: Patient declined     Attends Religious Services: Patient declined     Database administrator or Organizations: Patient declined     Attends Banker Meetings: Patient declined     Marital Status: Patient declined   Programmer, applications: Low Risk (08/16/2022)    Overall Financial Resource Strain (CARDIA)     Difficulty of Paying Living Expenses: Not very hard   Depression: Not at risk (01/10/2022)    [facility]    PHQ-2     Patient Health Questionnaire-2 Score: 0   Health Literacy: Not on file       Would you be willing to receive help with any of the needs that you have identified today? Not applicable       SHIPPING     Specialty Medication(s) to be Shipped:   Transplant: Envarsus 1mg , Envarsus 4mg , and azathioprine 50mg     Other medication(s) to be shipped: No additional medications requested for fill at this time     Changes to insurance: No    Delivery Scheduled: Yes, Expected medication delivery date: 03/17/2023.  However, Rx request for refills was sent to the provider as there are none remaining.     Medication will be delivered via UPS to the confirmed prescription address in [facility].    The patient will receive a drug information handout for each medication shipped and additional FDA Medication Guides as required.  Verified that patient has previously received a Conservation officer, historic buildings and a Surveyor, mining.    The patient or caregiver noted above participated in the development of this care plan and knows that they can request review of or adjustments to the care plan at any time.      All of the patient's questions and concerns  have been addressed.    Thad Ranger, PharmD   [facility] Specialty Pharmacist

## 2023-03-14 ENCOUNTER — Ambulatory Visit: Admit: 2023-03-14 | Discharge: 2023-03-15 | Payer: MEDICARE

## 2023-03-14 DIAGNOSIS — Z8639 Personal history of other endocrine, nutritional and metabolic disease: Principal | ICD-10-CM

## 2023-03-14 DIAGNOSIS — B9689 Other specified bacterial agents as the cause of diseases classified elsewhere: Principal | ICD-10-CM

## 2023-03-14 DIAGNOSIS — N760 Acute vaginitis: Principal | ICD-10-CM

## 2023-03-14 DIAGNOSIS — Z940 Kidney transplant status: Principal | ICD-10-CM

## 2023-03-14 DIAGNOSIS — F332 Major depressive disorder, recurrent severe without psychotic features: Principal | ICD-10-CM

## 2023-03-14 DIAGNOSIS — F32A Anxiety and depression: Principal | ICD-10-CM

## 2023-03-14 DIAGNOSIS — Z9483 Pancreas transplant status: Principal | ICD-10-CM

## 2023-03-14 DIAGNOSIS — F419 Anxiety disorder, unspecified: Principal | ICD-10-CM

## 2023-03-14 MED FILL — ENVARSUS XR 4 MG TABLET,EXTENDED RELEASE: ORAL | 30 days supply | Qty: 120 | Fill #0

## 2023-03-14 MED FILL — AZATHIOPRINE 50 MG TABLET: ORAL | 30 days supply | Qty: 60 | Fill #0

## 2023-03-14 NOTE — Unmapped (Addendum)
Internal Medicine Initial Visit        Assessment/Plan:     Elaine Koch presents today to establish care for primary care.. 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 See visit discussions as below.            1. 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 Bloating    3. BV (bacterial vaginosis)    4. Kidney replaced by transplant    5. Post-viral cough syndrome    6. External hemorrhoid    7. History of diabetes mellitus, type I    8. History of simultaneous kidney and pancreas transplant (CMS-HCC)        Depression and anxiety  Ms. Altieri notes today significant stress and anxiety in recent weeks. PHQ-9 score of 17 today. She did note some passive SI but was low risk on P4 scoring. She noted she derives great benefit from work with her therapist and would want to meet with her next week as scheduled before considering addditional increase in her lexapro dose which she reports has been stable for many years. I will plan to check in next week to discuss consideration of increasing dose after she sees her therapist. Will also send criss resources.   -Continue meeting with therapist  -Continue lexapro 5 mg daily    Bloating - Hx of Fecal Transplant for Recurrent C. Diff (2022)  Ms. Pressly notes a couple of weeks of bloating after recently finishing a course of antibiotics. She notes this has happened in the past and often resolves. No bowel changes or nausea. Will plan to discuss at next visit and consider SIBO testing if persistent. May also consider IBS.    Chronic Bacterial Vaginosis  Ms. Maniaci notes long standing issues with BV for which she has received numerous treatments and been in clinical trials. She is currently using flagyl gel twice a week but notes that in between doses her symptoms (itch and odor) recur. Given the lack of improvement in the past with numerous treatments discussed today increasing flagyl to three times a week. She was amenable.  -Flagyl 0.75% vaginal gel three times a week    4. Post-Viral Cough  She notes persistent cough since a viral infection two weeks ago. On exam lungs clear to auscultation without rales or wheezing. Encouraged use of tea with honey and guaifenesin she had been using at home.    5. Hemorrhoid  She notes an episode of blood on toilet paper while wiping earlier this week. On exam with a external large non-thrombosed hemorrhoid. She has found relief from topical lidocaine gel she found OTC which I encouraged her to continue to use today. She denies recent issues with constipation but we did discuss using stool softeners if she does become constipated to reduce the risk of hemorrhoids.     6. History of Pancreas/Kidney Transplant (2021) - History of [facility] - History of T1DM  Follows with Dr. Carlene Coria of transplant nephrology. Notes no acute concerns at this time on tacrolimus and azathioprine.   -Continue tacrolimus and azathioprine per transplant nephrology.     For next visit:  Will need to discuss blood pressure and reproductive health; deferred today in setting of additional pressing concerns.     Staffed with Dr. Altamease Oiler, seen and discussed    Return in about 3  months (around 06/11/2023).        Chief Complaint:      Elaine Koch is a 38 y.o. female who presents to Establish Care for multiple medical conditions.       Subjective:     HPI  PMHx    History of kidney/pancreas transplant. Before that 1 type 1 diabetic.   One son via c section.  She has not had any issues with her pancreas  She has had a couple of fecal transplants for C. 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 She has always had some issues since her c-section with some GI discomfort.  She has had bloating for two weeks. She has lost her appetitie. She is eating goldfish, crackers, and pineapple as snacks. No nausea. She notes some intermittent history of episodes that come and go that. Last night had pain      2)She has a hemorrhoid as well that has been very painful. She has been using lidocaine. It has been there a week and a half. She had one two times of more significant bleeding. She is having normal bowel movements.     3) She has had a cough for 2.5 weeks. 2 weekends ago she had a cough, slight fever, runny nose and cough. Dry cough. No more fevers. The cough is keeping her up at night with occasional coughing fits. Tussin has been what she is taking (guafenesin) which has helped.     4) She has had BV recurrently in the past with treatment with pills with BV. She is now doing twice a week BV treatment with metronidazole gel. She uses it Monday and Friday. The odor will come back. She was on a study drug in the past without improvement    5) Depression score today on 17. It was doing better but she has felt more stressed recetnly. She marked a few times that she would be betteroff being dead or hurting herself.     .   Review of Systems    The balance of 10 systems was reviewed and unremarkable except as stated above.        Objective:     BP 138/73 (BP Position: Sitting)  - Pulse 85  - Temp 36.6 ??C (97.8 ??F) (Temporal)  - Wt 66.9 kg (147 lb 6.4 oz)  - LMP 02/08/2023  - SpO2 99%  - BMI 24.53 kg/m??      General: Well appearing woman in no apparent distress  Eyes: Anicteric Sclera  Cardiovascular: Regular rate and rhythm, no murmurs  Pulmonary: Clear to auscultation bilaterally  Gastrointestinal: Soft, nontender, nondistended  Musculoskeletal: Normal muscle tone  Skin: No rash on clothed exam  Rectal: Light colored large external hemorrhoid visualized on exam      Records Review Outpatient notes from transplant nephrology     PHQ-9 Score:  PHQ-9 TOTAL SCORE: 17  GAD-7 Score:         Medication adherence and barriers to the treatment plan have been addressed. Opportunities to optimize healthy behaviors have been discussed. Patient / caregiver voiced understanding.  Suicidal ideation was assessed. Patient endorses passive suicidal ideation, denies current intent to self-harm. Suicide risk factors include current mental health or personality disorder, previous acts of self-harm, suicide plan, chronic severe medical condition, chronic mental illness > 5 years, and past diagnosis of depression. Mitigating factors include no know access to weapons or firearms, motivation for treatment, utilization of positive coping skills, supportive family, enjoyment of leisure actvities, expresses purpose for living, current treatment compliance, and safe housing. After risk assessment, measures taken include Explored patient's reasons for living, Discussed alternatives, and Practiced problem-solving techniques.Marland Kitchen

## 2023-03-14 NOTE — Unmapped (Addendum)
It was good to meet you today    1) Bloating - we can continue monitor for the next few weeks, but please reach out if still an issue    2) BV- For your BV please try using the gel three times a week     3) For your mood, I'll reach out next week to see if you are interested in going up on the lexapro.     4) For the cough using guafenenesin and tea and honey    5) For hemorrhoid continue to use your over the counter pain cream        [facility]  [address]  [place], [address]  Phone: [phone]  Toll Free: [phone]  Fax: [phone]    Thank you for choosing [facility] for your care.    Important Numbers    Main Clinic: [phone] or toll free [phone]    After Hours, Weekend, or Holidays:  Call the [facility] 24/7 Nursing Line [phone] to get nurse advice.  Go to [facility] ([facility]) walk-in clinic at [address], [address], [place], [place]; [phone]; 7 days a week from 9:00AM - 8:00PM.  Go to [facility] at [facility] at [address], [place], [place] [address]; [phone]; 7 days a week from 8:00PM - 8:00PM  Go to [facility] Care at [facility] at [address], [place], [place]; [phone]; Mon-Fri 8:00AM-7:00PM, Sat-Sun 12:00PM-5:00PM  Go to [facility] at [facility] at [address] [place], [place] [address], [phone], 7 days a week from 8:00AM - 8:00PM  Go to [facility] for sprains and strains, joint pain, sports injuries and possible fractures at [address], [place], [place]; [phone]; Mon-Thurs 8:00AM-7:00PM, Fri 8:00AM-5:00PM  [facility] website with full listings:  [email or web address]       [facility] Internal Medicine at [facility] Counselor: [phone]  6th Floor/Internal Medicine Clinic: Ivor Costa [phone]  Ardean Larsen (bilingual): [phone]    [facility]([place] PAP): [phone], choose option 2  [facility]: [phone] *Pharmacy can mail medications to your home. You must call to request the medication be mailed.[facility]: [phone]  [facility]: [phone]     Care Management: Are you having trouble with you health because of cost, your mood, trouble getting to clinic, or where you live. Our Care Management team can help!  Available to assist with requests sent via mychart or by calling the main clinic number at [phone]  Monday-Friday 8:00AM - 5:00PM    [facility]  I'm feeling unsafe, have experienced physical abuse, threats, emotional abuse, sexual abuse or other violence. Who do I call for confidential advice and assistance?  Call [phone] Monday through Fridays 9:00am-4:30pm. Call [phone] after hours.    Same Day Clinic   I'm sick today and need an appointment during office hours. Who do I call?  Call [phone], ask for an appointment in the Same Day Clinic  Same Day Clinic is located on the 5th floor at [address] [place] [address]    How do I request medication refills?  Request a refill via [facility] (patient portal), call clinic at [phone] or have your pharmacy fax the request to  [phone].    We highly encourage those with internet access to sign up for My [facility] Chart, our new patient portal service.  This service is free to all [facility] patients and offers the following benefits:  Secure messaging with your care team  Request appointments/cancel appointments  Access test results  Request prescription refills  Pay bills online  Manage the health of loved ones  Track your health    Sign up for your My [facility] Chart account at [email or web address].  Free Android and iOs smartphone and tablet applications are also available for your convenience.    How to reset you [facility] Chart  If you forget your [facility] Chart username or password, select ???forgot username??? or ???forgot password??? located under the ???sign in??? button on the login page. If you do not remember the information required to reset your password, you can contact [facility] at [phone] to have your My [facility] Chart password reset.    Scheduling appointments Online  The [facility] MyChart secure website and app makes it easy for you to schedule appointments. You can schedule most primary care clinic appointments online with providers you have seen before. Log in to [email or web address] and select Visits to schedule an appointment today

## 2023-03-19 ENCOUNTER — Encounter: Admit: 2023-03-19 | Discharge: 2023-03-20 | Payer: MEDICARE | Attending: Clinical | Primary: Clinical

## 2023-03-19 DIAGNOSIS — F4310 Post-traumatic stress disorder, unspecified: Principal | ICD-10-CM

## 2023-03-19 NOTE — Unmapped (Signed)
Confidential Psychological Therapy Session  [facility] for Transplant Care     Patient Name: Elaine Koch  Medical Record Number: [id number]  Date of Service: March 19, 2023  Clinical Psychologist: Artemio Aly, PhD  Intern: None  Time Spent: 47 min of face-to-face counseling  CPT Procedure Code: 25366 (45 min psychotherapy with patient and/or family)  Therapy Type: Behavior Modifying/Cognitive Behavioral Therapy (CBT)  Purpose of Treatment: assess current MH needs, reduce depression symptoms, reduce anxiety symptoms, improve quality of life, improve coping, adjustment to chronic medical illness, safety planning     *This patient was not seen in person to minimize potential spread of COVID-19, protect patients/family/providers, and reduce PPE utilization. During this time, transplant psychology will be limiting person-to-person contact when possible.*          The patient reports they are physically located in [place] and is currently: not at home (at work at [employer], located in [place]). I conducted a audio/video visit. I spent  63m 18s on the video call with the patient. I spent an additional 30 minutes on pre- and post-visit activities on the date of service. Neither patient nor provider were located on-site.       The limits of confidentiality and the purpose of the evaluation were reviewed. The patient was provided with a verbal description of the nature and purpose of the psychological evaluation. I also reviewed the referral source, specific referral question for this evaluation, foreseeable risks/discomforts, benefits, limits of confidentiality, and mandatory reporting requirements of this provider. The patient was given the opportunity to ask questions and receive answers about the present evaluation. Oral consent was provided by the patient.      This evaluation note may contain sensitive and confidential information regarding the patient???s psychosocial adjustment to living with a chronic medical condition. DO NOT share this information outside [facility] without written consent from the patient explicitly stating that mental health records may be released.      Referral/Relevant History:  Ms.  Koch is a very pleasant 38 y.o. female who presents for cognitive behavioral therapy to clarify current MH needs and address symptoms of depression/anxiety that were recently reported during her most recent post-transplant nephrology follow-up with Dr. Lucienne Minks True on 12/13/22, primarily in context of recent pregnancy loss. Elaine Koch has previously been referred for similar concerns in April 2024 and had been working with Clinical research associate until July 2024 (missed an appt in August after starting new job).  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      Elaine Koch met with writer initially on 07/12/22, when she endorsed symptoms consistent with PTSD, MDD Recurrent, and panic attacks, as well as frequent passive SI. She was engaged with a PHP through [facility] which had been beneficial, but as this was only a 21-day program, Clinical research associate agreed to continue to follow her for support until she is able to connect with a consistent, long-term provider; this nearly happened at [facility] but she was unable to attend that appt and never established outpatient MH care. She was seen today for her 8th appt with Clinical research associate.     Review of Symptoms/ROS: Deferred     Subjective:   Elaine Koch reported that she has been okay, though clarified that she is sometimes not okay due to several ongoing stressors. In particular, she described waking up in a good mood and with a positive attitude towards the day, but then she encounters health challenges, stressful situations and a heavy workload at  work, traffic, and challenges with her son, which make her feel permanently annoyed. For example, when a provider at her work drops a last minute request on her that puts extra work on her schedule, or when her boyfriend called her and made a sarcastic/joking comment about her looking too good to be going to work, she may let these things affect her mood for the rest of the day.     Time was spent reviewing cognitive restructuring strategies previously discussed, and we used these examples to challenge negative automatic interpretations. Elaine Koch noted that with her son, she is often able to give him more grace because he is young and she is mentally prepared that he may ask a lot of questions or do things that cause some annoyance. With coworkers, she was able to recognize that her providers are just trying to help their patients, and by helping with these last minute requests she is also helping the patients. With her boyfriend, we talked about how his humor and comments may be his way of showing his love and appreciation for her, and he has been receptive to her asking him to cut back on comments or questions about certain things in the past. She was encouraged to consider journaling/writing down thoughts that cause her to feel annoyed and practice challenging them.    We also continued to engage in treatment planning. Elaine Koch met with a new PCP (Dr. Lucretia Roers) last week and had a good visit, including discussing recent symptoms of depression and anxiety. He offered to increase her current dose of Lexapro, which we discussed today; she plans to reach out to him and trial a small increase to see if it provides additional benefit. We also reviewed rationale for reaching out to [facility] Psychiatry and the women's health team; she noted that she does not like talking about her recent pregnancy loss and has avoided doing this, but psychoeducation was provided about how processing trauma and grief can be beneficial. She plans to discuss this with her boyfriend and will consider reaching out to Psychiatry.      Objective:   Mental Status Exam:  Appearance: Appears stated age and Clean/Neat.  Motor: No abnormal movements  Speech/Language: Normal rate, volume, tone, fluency  Mood: Okay, but not okay  Affect: Somewhat flat, but mostly Mood congruent  Thought Process: Logical, linear, clear, coherent, goal directed  Thought Content: Suicidal Ideation, passive, denies plan/intent; denies HI or A/VH  Perceptual Disturbances: Denies auditory and visual hallucinations, behavior not concerning for response to internal stimuli  Orientation: Oriented to person, place, time, and general circumstances  Attention: Able to fully attend without fluctuations in consciousness  Concentration: Able to fully concentrate and attend  Memory: Immediate, short-term, long-term, and recall grossly intact  Fund of Knowledge: Consistent with level of education and development  Insight: Fair  Judgment: Fair  Impulse Control: Fair        Assessment:  Ms.  Ciocca participated well in this CBT session and exhibits good motivation towards treatment goals. Today she reported some improvements in symptoms of depression and anxiety, which she attributes to relying more on family support and trying not to less stressful situations bother her as much; symptoms likely remain in the moderate range (PHQ-9 score from last week at PCP office was 17, moderately severe symptoms of depression). She continues to use some effective coping strategies, in particular using behavioral activation to reduce avoidance and spend less time in bed, which is likely a key contributor  to improved mood reported today. Her biggest complaint is feeling permanently annoyed from various ongoing stressors, so we re-discussed cognitive restructuring strategies today to try to interpret situations in a different manner, and she reported that this was beneficial.      Taken altogether, Ms. Roan appears to be experiencing moderate symptoms of depression and anxiety at this time, though with good engagement in using CBT-oriented skills, she seems to be utilizing more effective coping strategies lately and has reported some improvements in mood compared to earlier this year. She would benefit from consistent, active engagement in [facility] care to treat these symptoms, but work schedule and finances are barriers to this. She was amenable to monthly check-ins with Clinical research associate for now, and we will continue to explore other treatment options. She was again encouraged to follow up with the [facility] clinic and she was given information for how to contact them to schedule an appointment.      Focus on current treatment is assessing current MH needs, safety planning, treatment planning.      Focus on future sessions will include ongoing assessment and safety planning, introducing and practicing CBT-oriented coping strategies.     Adherence concerns: None reported today, though previous nephrology notes suggest some issues with obtaining labs consistently and disjointed care (more ED visits than outpatient nephrology follow-up).     Diagnostic Impression: Posttraumatic Stress Disorder; Major Depressive Disorder, Severe, Recurrent; Unspecified Anxiety Disorder (panic attacks); R/O Bipolar Disorder        Risk Assessment:  A suicide and violence risk assessment was performed as part of this evaluation. The patient is deemed to be at chronic elevated risk for self-harm/suicide given the following factors: recent bereavement, recent trauma, current diagnosis of depression, hopelessness, previous acts of self-harm, suicidal ideation or threats without a plan, chronic severe medical condition, chronic mental illness > 5 years, and past diagnosis of depression. The patient is deemed to be at chronic elevated risk for violence given the following factors: recent loss. These risk factors are mitigated by the following factors: no known access to weapons or firearms, no history of violence, motivation for treatment, supportive family, sense of responsibility to family and social supports, minor children living at home, presence of an available support system, employment or functioning in a structured work/academic setting, religious or spiritual prohibition to suicide/violence, and safe housing. There is no acute risk for suicide or violence at this time. The patient was educated about relevant modifiable risk factors including following recommendations for treatment of psychiatric illness and abstaining from substance abuse.               While future psychiatric events cannot be accurately predicted, the patient does not currently require  acute inpatient psychiatric care and does not currently meet [facility] involuntary commitment criteria.               Psychometric Testing: None administered today. PHQ-9 score at PCP visit on 03/14/23 was 17 (moderately severe symptoms of depression)         Plan:  Writer will attempt to meet with Ms. Bordas as a bridge until she can establish consistent MH care, though work and finances have been a barrier to this and she was lost to follow-up with this Clinical research associate previously. Specific skills she was encouraged to practice include engagement in values-based behavior (e.g. spending more time with family, attending church) and journaling to practice challenging negative automatic thoughts. She was encouraged to reach out to [facility] as a potential next step for establishing care.     Ms.  Mello will return Wednesday 04/16/23 at 12:00pm via video.       Ms.  Petrasek was given this writer's contact information with confidential voice mail number and instructed to call 911 for emergencies.

## 2023-03-21 DIAGNOSIS — F332 Major depressive disorder, recurrent severe without psychotic features: Principal | ICD-10-CM

## 2023-03-21 MED ORDER — ESCITALOPRAM 5 MG TABLET
ORAL_TABLET | Freq: Every day | ORAL | 11 refills | 30.00 days | Status: CP
Start: 2023-03-21 — End: 2024-03-15

## 2023-03-24 DIAGNOSIS — Z940 Kidney transplant status: Principal | ICD-10-CM

## 2023-03-24 DIAGNOSIS — Z79899 Other long term (current) drug therapy: Principal | ICD-10-CM

## 2023-03-25 NOTE — Unmapped (Signed)
I saw and evaluated the patient, participating in the key portions of the service.  I reviewed the resident???s note.  I agree with the resident???s findings and plan. Trina Ao, MD

## 2023-03-28 MED ORDER — METRONIDAZOLE 0.75 % (37.5 MG/5 GRAM) VAGINAL GEL
VAGINAL | 6 refills | 0.00 days | Status: CP
Start: 2023-03-28 — End: 2023-08-25

## 2023-03-31 DIAGNOSIS — Z940 Kidney transplant status: Principal | ICD-10-CM

## 2023-04-06 ENCOUNTER — Ambulatory Visit
Admit: 2023-04-06 | Discharge: 2023-04-10 | Disposition: A | Discharge status: Home / Self Care | Payer: MEDICARE | Admitting: Nephrology

## 2023-04-06 ENCOUNTER — Ambulatory Visit: Admit: 2023-04-06 | Discharge: 2023-04-10 | Payer: MEDICARE

## 2023-04-06 ENCOUNTER — Inpatient Hospital Stay
Admit: 2023-04-06 | Discharge: 2023-04-10 | Disposition: A | Discharge status: Home / Self Care | Payer: MEDICARE | Admitting: Nephrology

## 2023-04-06 ENCOUNTER — Encounter: Admit: 2023-04-06 | Discharge: 2023-04-10 | Payer: MEDICARE | Attending: Nephrology

## 2023-04-06 LAB — BASIC METABOLIC PANEL
ANION GAP: 14 mmol/L (ref 5–14)
BLOOD UREA NITROGEN: 41 mg/dL — ABNORMAL HIGH (ref 9–23)
BUN / CREAT RATIO: 12
CALCIUM: 9 mg/dL (ref 8.7–10.4)
CHLORIDE: 115 mmol/L — ABNORMAL HIGH (ref 98–107)
CO2: 16.3 mmol/L — ABNORMAL LOW (ref 20.0–31.0)
CREATININE: 3.32 mg/dL — ABNORMAL HIGH (ref 0.55–1.02)
EGFR CKD-EPI (2021) FEMALE: 18 mL/min/{1.73_m2} — ABNORMAL LOW (ref >=60)
GLUCOSE RANDOM: 93 mg/dL (ref 70–179)
POTASSIUM: 5.5 mmol/L — ABNORMAL HIGH (ref 3.4–4.8)
SODIUM: 145 mmol/L (ref 135–145)

## 2023-04-06 LAB — CBC W/ AUTO DIFF
BASOPHILS ABSOLUTE COUNT: 0 10*9/L (ref 0.0–0.1)
BASOPHILS RELATIVE PERCENT: 2.4 %
EOSINOPHILS ABSOLUTE COUNT: 0.1 10*9/L (ref 0.0–0.5)
EOSINOPHILS RELATIVE PERCENT: 11.5 %
HEMATOCRIT: 14.4 % — CL (ref 34.0–44.0)
HEMOGLOBIN: 4.8 g/dL — CL (ref 11.3–14.9)
LYMPHOCYTES ABSOLUTE COUNT: 0.3 10*9/L — ABNORMAL LOW (ref 1.1–3.6)
LYMPHOCYTES RELATIVE PERCENT: 27.7 %
MEAN CORPUSCULAR HEMOGLOBIN CONC: 33.6 g/dL (ref 32.0–36.0)
MEAN CORPUSCULAR HEMOGLOBIN: 37.6 pg — ABNORMAL HIGH (ref 25.9–32.4)
MEAN CORPUSCULAR VOLUME: 112 fL — ABNORMAL HIGH (ref 77.6–95.7)
MEAN PLATELET VOLUME: 9.5 fL (ref 6.8–10.7)
MONOCYTES ABSOLUTE COUNT: 0.1 10*9/L — ABNORMAL LOW (ref 0.3–0.8)
MONOCYTES RELATIVE PERCENT: 6.6 %
NEUTROPHILS ABSOLUTE COUNT: 0.6 10*9/L — ABNORMAL LOW (ref 1.8–7.8)
NEUTROPHILS RELATIVE PERCENT: 51.8 %
NUCLEATED RED BLOOD CELLS: 0 /100{WBCs} (ref <=4)
PLATELET COUNT: 122 10*9/L — ABNORMAL LOW (ref 150–450)
RED BLOOD CELL COUNT: 1.28 10*12/L — ABNORMAL LOW (ref 3.95–5.13)
RED CELL DISTRIBUTION WIDTH: 20.2 % — ABNORMAL HIGH (ref 12.2–15.2)
WBC ADJUSTED: 1.1 10*9/L — ABNORMAL LOW (ref 3.6–11.2)

## 2023-04-06 LAB — URINALYSIS WITH MICROSCOPY WITH CULTURE REFLEX PERFORMABLE
BACTERIA: NONE SEEN /HPF
BILIRUBIN UA: NEGATIVE
GLUCOSE UA: NEGATIVE
KETONES UA: NEGATIVE
LEUKOCYTE ESTERASE UA: NEGATIVE
NITRITE UA: NEGATIVE
PH UA: 5 (ref 5.0–9.0)
RBC UA: 1 /HPF (ref <=4)
SPECIFIC GRAVITY UA: 1.011 (ref 1.003–1.030)
SQUAMOUS EPITHELIAL: 1 /HPF (ref 0–5)
UROBILINOGEN UA: <2
WBC UA: 1 /HPF (ref 0–5)

## 2023-04-06 LAB — RENAL FUNCTION PANEL
ALBUMIN: 3.8 g/dL (ref 3.4–5.0)
ANION GAP: 12 mmol/L (ref 5–14)
BLOOD UREA NITROGEN: 45 mg/dL — ABNORMAL HIGH (ref 9–23)
BUN / CREAT RATIO: 14
CALCIUM: 9.2 mg/dL (ref 8.7–10.4)
CHLORIDE: 113 mmol/L — ABNORMAL HIGH (ref 98–107)
CO2: 17 mmol/L — ABNORMAL LOW (ref 20.0–31.0)
CREATININE: 3.15 mg/dL — ABNORMAL HIGH (ref 0.55–1.02)
EGFR CKD-EPI (2021) FEMALE: 19 mL/min/{1.73_m2} — ABNORMAL LOW (ref >=60)
GLUCOSE RANDOM: 90 mg/dL (ref 70–179)
PHOSPHORUS: 4.7 mg/dL (ref 2.4–5.1)
POTASSIUM: 5.7 mmol/L — ABNORMAL HIGH (ref 3.5–5.1)
SODIUM: 142 mmol/L (ref 135–145)

## 2023-04-06 LAB — CBC
HEMATOCRIT: 19.9 % — ABNORMAL LOW (ref 34.0–44.0)
HEMOGLOBIN: 6.8 g/dL — ABNORMAL LOW (ref 11.3–14.9)
MEAN CORPUSCULAR HEMOGLOBIN CONC: 34.5 g/dL (ref 32.0–36.0)
MEAN CORPUSCULAR HEMOGLOBIN: 36.1 pg — ABNORMAL HIGH (ref 25.9–32.4)
MEAN CORPUSCULAR VOLUME: 104.7 fL — ABNORMAL HIGH (ref 77.6–95.7)
MEAN PLATELET VOLUME: 8.2 fL (ref 6.8–10.7)
PLATELET COUNT: 97 10*9/L — ABNORMAL LOW (ref 150–450)
RED BLOOD CELL COUNT: 1.9 10*12/L — ABNORMAL LOW (ref 3.95–5.13)
RED CELL DISTRIBUTION WIDTH: 23.9 % — ABNORMAL HIGH (ref 12.2–15.2)
WBC ADJUSTED: 0.9 10*9/L — ABNORMAL LOW (ref 3.6–11.2)

## 2023-04-06 LAB — COMPREHENSIVE METABOLIC PANEL
ALBUMIN: 3.8 g/dL (ref 3.4–5.0)
ALKALINE PHOSPHATASE: 64 U/L (ref 46–116)
ALT (SGPT): 107 U/L — ABNORMAL HIGH (ref 10–49)
ANION GAP: 11 mmol/L (ref 5–14)
AST (SGOT): 75 U/L — ABNORMAL HIGH (ref <=34)
BILIRUBIN TOTAL: 1.1 mg/dL (ref 0.3–1.2)
BLOOD UREA NITROGEN: 43 mg/dL — ABNORMAL HIGH (ref 9–23)
BUN / CREAT RATIO: 13
CALCIUM: 9.1 mg/dL (ref 8.7–10.4)
CHLORIDE: 115 mmol/L — ABNORMAL HIGH (ref 98–107)
CO2: 18.1 mmol/L — ABNORMAL LOW (ref 20.0–31.0)
CREATININE: 3.33 mg/dL — ABNORMAL HIGH (ref 0.55–1.02)
EGFR CKD-EPI (2021) FEMALE: 18 mL/min/{1.73_m2} — ABNORMAL LOW (ref >=60)
GLUCOSE RANDOM: 97 mg/dL (ref 70–179)
POTASSIUM: 5.4 mmol/L — ABNORMAL HIGH (ref 3.4–4.8)
PROTEIN TOTAL: 7 g/dL (ref 5.7–8.2)
SODIUM: 144 mmol/L (ref 135–145)

## 2023-04-06 LAB — MAGNESIUM: MAGNESIUM: 2 mg/dL (ref 1.6–2.6)

## 2023-04-06 LAB — SLIDE REVIEW

## 2023-04-06 LAB — FOLATE: FOLATE: 4 ng/mL — ABNORMAL LOW (ref >=5.4)

## 2023-04-06 LAB — LIPASE: LIPASE: 30 U/L (ref 12–53)

## 2023-04-06 LAB — FIBRINOGEN: FIBRINOGEN LEVEL: 210 mg/dL (ref 175–500)

## 2023-04-06 LAB — PREGNANCY, URINE: PREGNANCY TEST URINE: NEGATIVE

## 2023-04-06 LAB — VITAMIN B12: VITAMIN B-12: 699 pg/mL (ref 211–911)

## 2023-04-06 LAB — HCG QUANTITATIVE, BLOOD: GONADOTROPIN, CHORIONIC (HCG) QUANT: <2.6 m[IU]/mL

## 2023-04-06 NOTE — Unmapped (Signed)
 Informed Blood Consent     I have advised Elaine Koch of their medical condition, and that the chances for her improvement or recovery will be significantly helped by receiving blood products by transfusion:  Such as packed red blood cells, fresh frozen plasma, platelets or cryoprecipitate (blood products).     I have explained the benefits that are expected from the patient being transfused and, as well, the risk.  The patient understands that although the blood products to be administered had been prepared and tested in accordance with strict scientific rules established by the American Association of Blood Banks, there is still a very small - approximately 1 and 70,000 for Hemolytic Reactions and approximately 1 and 190,000 for TRALI - chance of blood products could be incompatible with the patient's body and a transfusion reaction can occur.  Although transfusion reactions can be treated successfully, the patient understands that on rare occasions they can be fatal (1 in 250,000 transfusions).  The patient also understands that allergic reactions to blood products with hives, itching, and fever are more common but can be treated and may not even require the transfusion to be stopped.  The patient understands that even with testing by the most up-to-date methods, there is a small chance that the blood products may contain a virus that may not be recognized as an infection for many months or years.  Even with proper testing, I discussed with the patient that per the American Red Cross, the chance for contracting viral Hepatitis B is approximately 1 in 1 million Hepatitis C is approximately 1 in 1.5 million, and HIV is approximately 1 in 2 million.     I gave the patient an opportunity to ask questions regarding the transfusion of blood products and I answered those questions to the patient's satisfaction.      ______________________________________________________________________  Instructions for Nursing:  Nurse will indicate at the top of the consent form (page 1) the portions in BOLD:  1.  I authorize Dr. Vonna Kotyk, MD and/or associates and assistants of his/her choice at [facility] (referred to herein as 'facility') to perform the following procedure(s): Transfusion of Blood Products.     The patient should place her initials in the area under #4 indicating Authorization of blood products.     The patient's signature (page 2) will be obtained by nursing staff on the consent form and the nurse will verify this consent under the Witness Certification (page 2).  The paper form will subsequently be placed in the patient's physical chart (later to be scanned) as further evidence that the patient has given consent to the administration of blood products.

## 2023-04-06 NOTE — Unmapped (Addendum)
 Pt has been alert and oriented throughout the shift. VSS; denied having pain. H&H came as 6.3; one unit of PRBC given per order and no sign of transfusion reaction noted. Enteric precaution maintained; awaiting for stool specimen. Bed locked in the lowest position with two side rails up. Pt is free from fall/injury.  Problem: Adult Inpatient Plan of Care  Goal: Plan of Care Review  04/06/2023 2311 by Leisa Lenz, RN  Outcome: Progressing  04/06/2023 2310 by Alan Mulder A, RN  Outcome: Progressing  Goal: Patient-Specific Goal (Individualized)  04/06/2023 2311 by Leisa Lenz, RN  Outcome: Progressing  04/06/2023 2310 by Alan Mulder A, RN  Outcome: Progressing  Goal: Absence of Hospital-Acquired Illness or Injury  04/06/2023 2311 by Leisa Lenz, RN  Outcome: Progressing  04/06/2023 2310 by Alan Mulder A, RN  Outcome: Progressing  Goal: Optimal Comfort and Wellbeing  04/06/2023 2311 by Leisa Lenz, RN  Outcome: Progressing  04/06/2023 2310 by Leisa Lenz, RN  Outcome: Progressing  Goal: Readiness for Transition of Care  04/06/2023 2311 by Leisa Lenz, RN  Outcome: Progressing  04/06/2023 2310 by Leisa Lenz, RN  Outcome: Progressing  Goal: Rounds/Family Conference  04/06/2023 2311 by Leisa Lenz, RN  Outcome: Progressing  04/06/2023 2310 by Alan Mulder A, RN  Outcome: Progressing     Problem: Infection  Goal: Absence of Infection Signs and Symptoms  04/06/2023 2311 by Alan Mulder A, RN  Outcome: Progressing  04/06/2023 2310 by Alan Mulder A, RN  Outcome: Progressing     Problem: Wound  Goal: Optimal Coping  04/06/2023 2311 by Leisa Lenz, RN  Outcome: Progressing  04/06/2023 2310 by Alan Mulder A, RN  Outcome: Progressing  Goal: Optimal Functional Ability  04/06/2023 2311 by Leisa Lenz, RN  Outcome: Progressing  04/06/2023 2310 by Alan Mulder A, RN  Outcome: Progressing  Goal: Absence of Infection Signs and Symptoms  04/06/2023 2311 by Leisa Lenz, RN  Outcome: Progressing  04/06/2023 2310 by Alan Mulder A, RN  Outcome: Progressing  Goal: Improved Oral Intake  04/06/2023 2311 by Alan Mulder A, RN  Outcome: Progressing  04/06/2023 2310 by Alan Mulder A, RN  Outcome: Progressing  Goal: Optimal Pain Control and Function  04/06/2023 2311 by Leisa Lenz, RN  Outcome: Progressing  04/06/2023 2310 by Leisa Lenz, RN  Outcome: Progressing  Goal: Skin Health and Integrity  04/06/2023 2311 by Leisa Lenz, RN  Outcome: Progressing  04/06/2023 2310 by Alan Mulder A, RN  Outcome: Progressing  Goal: Optimal Wound Healing  04/06/2023 2311 by Leisa Lenz, RN  Outcome: Progressing  04/06/2023 2310 by Leisa Lenz, RN  Outcome: Progressing

## 2023-04-06 NOTE — Unmapped (Deleted)
 Entered in error. Please see full H&P dated 04/06/23.

## 2023-04-06 NOTE — Unmapped (Addendum)
 Elaine Koch is a 38 y.o. 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     Active Problems     #Abnormal Vaginal Bleeding, stable  #Macrocytic Anemia 2/2 azathioprine  #Recurrent BV  Hgb 4.8 on admission (baseline 8-9), now s/p 3u pRBCs with appropriate response to 7.8, stable. Intermittent spotting with clots associated with lightheadedness and dizziness for 6 days in addition to 2 weeks of diarrhea. Chronic macrocytic anemia since 2022; iron panel Nov 2024 normal; B12 normal, folate low. Hx 2 prior D&Cs in 2023 and 2024. LMP 03/10/23. History intramural fibroids and hemorrhagic ovarian cyst (seen on ultrasound Dec 2024). At [facility] ED, cervical os closed and TVUS unremarkable.  HCG negative. 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 Differential initially included acute blood loss includes fibroid bleed, CMV endometritis, menorrhagia, vaginal lac; G/C cervical swab negative. Possibly with underlying coagulopathy, such as acquired vWD, uremic platelet dysfunction, liver dysfunction (AST/ALT elevated), though this is less likely given no history of bleeding before this episode. Given lack of other etiology found structurally nor via anemia workup, most likely secondary to patient's existing fibroids, but gynecology consult mentioned multifactorial.  Gynecology recommended a Provera taper and to stop OCP. Patient's bleeding improved. Had only been noticing blood in the toilet, but has not been changing pads as frequently by time of discharge. Hgb stable 8.    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 Carlene Coria. Cr 3.3 (baseline 1.8-2.1). K+ 5.4, bicarb 16.3. UA without evidence of infection. Suspect ATN 2/2 hypoperfusion injury iso anemia and GI losses vs rejection. Hyperkalemic acidosis likely 2/2 tubular injury; diarrhea (below) contributing to metabolic acidosis and hypovolemia. HDS, clinically somewhat hypovolemic. Poor PO in due to low appetite, but no N/V. Patient with likely ATN.  Patient's creatinine is increased since yesterday.  Attempted oral rehydration protocol yesterday without improvement in labs, although slight (3.12-->3.25-->3.43). Low threshold to begin IV fluid therapy.  Concern for dilutional effect given patient's anemia.  Will attempt oral rehydration again today, with plans to attempt IV fluid therapy if there is no improvement.  We provided gentle fluid resuscitation and oral rehydration.  No indications for emergent HD at this time.  Continued home bicarb and use Lokelma to normalize potassium.  Immunosuppression as below.  Consulted psychiatry as below.     # Diarrhea, Non-Bloody - Hx Recurrent C.diff, improving  2 weeks of non-bloody diarrhea a/w severe bloating.  No recent antibiotic usage, abdominal pain, fevers.  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 Outpatient provider January 2025 recommended SIBO testing if persistent.  Also considered IBS. Will perform basic infectious workup and provide supportive care at this time.  Patient's diarrhea improved throughout admission.  GI PP found to have positive Yersinia.  Patient started on ciprofloxacin daily for 5 days.    #Immunosuppression - Pancytopenia - Neutropenia  WBC 0.7, ANC 0.3 on 2/24, improved s/p GCSF. Pancytopenia from azathioprine, currently held. Will discuss with her transplant nephrologist and consider switch to MMF.  S/p Filgrastrim 300 mcg x1 on 2/24. Continued home Tacrolimus 18mg  daily: goal 8-10 given her pancreas transplant; repeat trough measurement per pharmacist recommendation. Held Azathioprine 100 mg daily.. Patient  is not on MMF due to desire for pregnancy, but patient does not desire pregnancy at this time; ongoing discussion, but may be open to switch from AZA to MMF.     #Pain management - Headache - History of Migraines:  Chronic leg pain since 2022 and history of migraines. Previously on triptan; opioid naive.  Patient found relief with home Esgic.    Chronic/Stable Problems  #Elevated AST/ALT, resolving  75/107 in ER, from normal baseline in Dec 2024. Likely shock liver from hypovolemia and anemia. Will continue to trend, obtain RUQ [place] if persistent despite resuscitation. Trending down with resuscitation.     #Type 1 DM s/p pancreas transplant 2021: Amylase, lipase, C-peptide wnl at outpatient visit; lipase on admission wnl. Immunosuppression as above.     #Depression/anxiety: Stable on Lexapro, she has been working with Dr. Neale Burly with plans to establish with mental health locally. Psychiatry consulted per transplant medicine.  Recommended continuing Lexapro, starting mirtazapine, and TSH.  TSH of 1.5, wnl.     # Hx of Hemorrhoids: Patient has history of external large non-thrombosed hemorrhoids. Used OTC gels at home. At recent OP IM visit, denied issues with constipation.     #Sickle Cell trait: Noncontributory     #Continuous murmur at RUSB and LUSB: Noted on prior admissions. Possibly a referred murmur from RUE AV fistula, with flow murmur in setting of severe anemia. Last TTE in 2021 was unremarkable.

## 2023-04-06 NOTE — Unmapped (Signed)
 Tacrolimus Therapeutic Monitoring Pharmacy Note    Kijuana Ruppel is a 38 y.o. female continuing tacrolimus.     Indication: Pancreas transplant     Date of Transplant:  05/03/19       Prior Dosing Information: Home regimen Envarsus XR 18 mg daily      Source(s) of information used to determine prior to admission dosing: Home Medication List, Clinic Note, or Fill HIstory    Goals:  Therapeutic Drug Levels  Tacrolimus trough goal: 8-10 ng/mL    Additional Clinical Monitoring/Outcomes  Monitor renal function (SCr and urine output) and liver function (LFTs)  Monitor for signs/symptoms of adverse events (e.g., hyperglycemia, hyperkalemia, hypomagnesemia, hypertension, headache, tremor)    Results:   Tacrolimus level: Not applicable    Pharmacokinetic Considerations and Significant Drug Interactions:  Concurrent CYP3A4 substrates/inhibitors:  norgestimate-ethinyl estradiol    Assessment/Plan:  Recommendedation(s)  Continue current regimen of tacrolimus extended release 18 mg daily    Follow-up  Next level has been ordered on 04/07/23 at 0600 .   A pharmacist will continue to monitor and recommend levels as appropriate    Please page service pharmacist with questions/clarifications.    Annslee Tercero [facility], PharmD

## 2023-04-06 NOTE — Unmapped (Signed)
 Patient admitted from , OSH with hx of vaginal bleeding,  received 2 units of blood from OSH, changed 2 pads since admission this evening, vital signs stable,denies pain,fall precaution in place,call bell within reach.  Problem: Adult Inpatient Plan of Care  Goal: Plan of Care Review  Outcome: Ongoing - Unchanged  Goal: Patient-Specific Goal (Individualized)  Outcome: Ongoing - Unchanged  Goal: Absence of Hospital-Acquired Illness or Injury  Outcome: Ongoing - Unchanged  Goal: Optimal Comfort and Wellbeing  Outcome: Ongoing - Unchanged  Goal: Readiness for Transition of Care  Outcome: Ongoing - Unchanged  Goal: Rounds/Family Conference  Outcome: Ongoing - Unchanged     Problem: Infection  Goal: Absence of Infection Signs and Symptoms  Outcome: Ongoing - Unchanged

## 2023-04-06 NOTE — Unmapped (Addendum)
 [facility] Emergency Department Provider Note    Patient Name: Elaine Koch  Chief Complaint: Vaginal Bleeding       ED Clinical Impression     Final diagnoses:   Anemia, unspecified type (Primary)   Kidney transplant recipient   Pancreas transplant status (CMS-HCC)   Leiomyoma        Impression, ED Course, Assessment and Plan   MDM  This is a 38 y.o. female with a past medical history of anemia, T1DM, ESRD (s/p dual pancreas and kidney transplant 2021), and anemia presenting with 6 days with intermittent spotting with clots and associated light headedness and dizziness as well as two weeks of diarrhea. On exam, the patient is resting comfortably, in no acute distress. Vital signs are within normal limits. Patient has mild conjunctival pallor. Capillary refill is less than 2 seconds. Abdomen is soft and non-tender. No rebound, gaurding, masses, or rigidity. No significant tenderness to palpation of all four quadrants of the abdomen. No peripheral edema or unilateral lower extremity swelling. Murphy's and McBurney's point negative. GU exam shows normal external genitalia without mass, swelling, lacerations. Mild bleeding from cervix with minimal clots noted in vaginal vault. No cervicitis, os closed. No lacerations, prolapsing mass noted.     EKG shows sinus rhythm at a rate of 84 bpm. No significant ST segment elevation/depression or concerning features. Normal intervals. CBC notable for significant anemia with Hgb of 4.8 (baseline 7-8). Will consent patient and treat with 2 units of RBC. CMP with mild hyperkalemia to 5.4 and Cr elevation to 3.33 (up from baseline ~2.2). UA without evidence of infection. Lipase normal at 30. hCG negative. TVUS shows small intramural leiomyoma, 1.9cm unchanged from prior exam with small volume pelvic free fluid. Will obtain renal US doppler to evaluate for graft rejection, however suspect anemia/hypovolemia is primary driving factor. Discussed with transplant nephrology, who recommend direct admission to [facility]. Patient admitted in stable condition.     Critical Care    Performed by: Sabino Gasser, MD  Authorized by: Sabino Gasser, MD    Critical care provider statement:     Critical care time (minutes):  32    Critical care was necessary to treat or prevent imminent or life-threatening deterioration of the following conditions:  Circulatory failure    Critical care was time spent personally by me on the following activities:  Development of treatment plan with patient or surrogate, discussions with consultants, evaluation of patient's response to treatment, examination of patient, obtaining history from patient or surrogate, ordering and performing treatments and interventions, ordering and review of laboratory studies, ordering and review of radiographic studies, pulse oximetry, re-evaluation of patient's condition and review of old charts        Discussion of Management with other Physicians, QHP or Appropriate Source: Nephrology  Independent Interpretation of Studies: EKG - see above, labs, TVUS  External Records Reviewed: Patient's most recent outpatient clinic note- 03/14/23 Internal Medicine Office Visit Note- Reviewed past medical history and medications.  Escalation of Care, Consideration of Admission/Observation/Transfer: Admission necessary for further management of anemia in the setting of prior renal and pancreatic transplant  Social determinants that significantly affected care: None applicable  Prescription drug(s) considered but not prescribed: None.  Diagnostic tests considered but not performed: None.  History obtained from other sources: None       I have reviewed the vital signs and the nursing notes. Labs and radiology results that were available during my care of the patient were independently reviewed by  me and considered in my medical decision making.     I reviewed the patient's prior medical records.  I independently visualized the EKG tracing.  I independently visualized the radiology images.      Disposition:   The patient was admitted for further management of anemia in the setting of prior renal and pancreatitic transplant.       History   History obtained from: Patient    Patient is a 38 y.o. female with a past medical history of anemia, T1DM, ESRD (s/p dual pancreas and kidney transplant 2021), and anemia presenting to the Emergency Department for evaluation of vaginal bleeding. The patient reports vaginal bleeding for 6 days with intermittent spotting with clots and associated light headedness and dizziness. She notes she has had to use 6 super tampons per day since the onset of the vaginal bleeding. She reports two prior D&Cs, one in 2024 following miscarriage and one in 2023. She notes her LMP was 03/10/23. She has a history of ovarian cysts and fibroids.     Additionally, she reports 2 weeks of non-bloody diarrhea following severe bloating. She has had a kidney and pancreas transplant and she endorses compliance with her anti-rejection medications (Imuran and tacrolimus) following the surgery but has not taken her medications this morning. She notes she takes an intervaginal medication, Metrogel, for recurrent BV x3 times a week. She also states she recently finished a course of PO Flagyl for BV. She denies recent antibiotic use or dietary changes. She denies a history of other abdominal surgeries. She reports no current sexual activity but states no usage of protection for past sexual encounters. Denies abdominal pain, vaginal pain, chest pain, trouble breathing, cough, fever, dysuria, or hematochezia.       Past Medical/Past Surgical History:   Reviewed in EHR including nursing documentation as outlined. Pertinent PMH/PSH also noted above in HPI.   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     Patient Active Problem List   Diagnosis    Anemia in stage 3b chronic kidney disease (CMS-HCC)    History of diabetes mellitus, type I    Sickle cell trait (CMS-HCC)    Other secondary hypertension    History of blood transfusion    Cardiomyopathy (CMS-HCC)    Kidney replaced by transplant    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    AKI (acute kidney injury) (CMS-HCC)    Previous cesarean delivery affecting pregnancy    Other neutropenia (CMS-HCC)    History of simultaneous kidney and pancreas transplant (CMS-HCC)    BV (bacterial vaginosis)    Chronic diarrhea of unknown origin    Worsening renal function    Anxiety and depression    Chest pain    History of migraine    UTI (urinary tract infection)    Major depressive disorder, 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)    Anemia    Vaginal bleeding    Acute kidney injury superimposed on chronic kidney disease (CMS-HCC)    Recurrent Clostridioides difficile diarrhea    Immunosuppressed status (CMS-HCC)    Pancytopenia (CMS-HCC)    Elevated liver enzymes    Depression  Hyperkalemia    Metabolic acidosis     Past Surgical History:   Procedure Laterality Date    AV FISTULA PLACEMENT  2018    CESAREAN SECTION      PR COLONOSCOPY FLX DX W/COLLJ SPEC WHEN PFRMD N/A 02/14/2020    Procedure: COLONOSCOPY, FLEXIBLE, PROXIMAL TO SPLENIC FLEXURE; DIAGNOSTIC, W/WO COLLECTION SPECIMEN BY BRUSH OR WASH;  Surgeon: Carmon Ginsberg, MD;  Location: [facility];  Service: Gastroenterology    PR COLONOSCOPY FLX DX W/COLLJ SPEC WHEN PFRMD N/A 06/19/2020    Procedure: COLONOSCOPY, FLEXIBLE, PROXIMAL TO SPLENIC FLEXURE; DIAGNOSTIC, W/WO COLLECTION SPECIMEN BY BRUSH OR WASH;  Surgeon: Carmon Ginsberg, MD;  Location: GI PROC[facility]URES [facility];  Service: Gastroenterology PR FECAL MICROBIOTA PREP INSTIL N/A 02/14/2020    Procedure: PREP W INSTILLATION FECAL MICROBIOTA, ANY METHOD;  Surgeon: Carmon Ginsberg, MD;  Location: [facility];  Service: Gastroenterology    PR INDUCED ABORTN BY DIL/EVAC N/A 03/20/2021    Procedure: DILATION AND EVACUATION - INDUCED;  Surgeon: Gaynelle Cage, MD;  Location[facility] OR [facility];  Service: Family Planning    PR LAP,DIAGNOSTIC ABDOMEN N/A 11/17/2020    Procedure: DIAGNOSTIC AND OR OPERATIVE LAPAROSCOPY;  Surgeon: Estil Daft, MD;  Location: MAIN OR [facility];  Service: Womens Primary Gynecology    PR PREPARE FECAL MICROBIOTA FOR INSTILLATION  06/19/2020    Procedure: PREP FECAL MICROBIOTA FOR INSTILLATION, INCLUDING ASSESSMENT OF DONOR SPECIMEN;  Surgeon: Carmon Ginsberg, MD;  Location: [facility];  Service: Gastroenterology    PR SURG RX MISSED ABORTN,1ST TRI N/A 11/17/2020    Procedure: VACUUM ASPIRATION;  Surgeon: Estil Daft, MD;  Location: [facility] OR [facility];  Service: Jaedan Huttner D [facility] Primary Gynecology    PR SURG RX MISSED ABORTN,1ST TRI N/A 11/19/2022    Procedure: TREATMENT OF MISSED ABORTION, COMPLETED SURGICALLY; FIRST TRIMESTER;  Surgeon: Asencion Partridge, MD;  Location: [facility] OR [facility];  Service: [facility] Primary Gynecology    PR TRANSPLANT ALLOGRAFT PANCREAS N/A 05/03/2019    Procedure: TRANSPLANTATION OF PANCREATIC ALLOGRAFT;  Surgeon: Leona Carry, MD;  Location: MAIN OR [facility];  Service: Transplant    PR TRANSPLANT,PREP CADAVER RENAL GRAFT N/A 05/03/2019    Procedure: [facility] STD PREP CAD DONR RENAL ALLOGFT PRIOR TO TRNSPLNT, INCL DISSEC/REM PERINEPH FAT, DIAPH/RTPER ATTAC;  Surgeon: Leona Carry, MD;  Location: MAIN OR [facility];  Service: Transplant    PR TRANSPLANT,PREP DONOR PANCREAS N/A 05/03/2019    Procedure: BACKBENCH STANDARD PREPARATION OF CADAVER DONOR PANCREAS ALLOGRAFT PRIOR TO TRANSPLANTATION;  Surgeon: Leona Carry, MD;  Location: MAIN OR [facility]; Service: Transplant    PR TRANSPLANTATION OF KIDNEY N/A 05/03/2019    Procedure: RENAL ALLOTRANSPLANTATION, IMPLANTATION OF GRAFT; WITHOUT RECIPIENT NEPHRECTOMY;  Surgeon: Leona Carry, MD;  Location: MAIN OR [facility];  Service: Transplant    TONSILLECTOMY         Social History/Family History:   Reviewed in EMR, I agree with nursing documentation. Additional pertinent social and family history noted in HPI.   Social History     Tobacco Use    Smoking status: Never    Smokeless tobacco: Never   Vaping Use    Vaping status: Never Used   Substance Use Topics    Alcohol use: Never    Drug use: Never     Family History   Problem Relation Age of Onset    Diabetes Mother     Diabetes Father     Cancer Maternal  Grandmother     Diabetes Maternal Grandfather     Diabetes Paternal Grandmother        ROS  A review of systems reviewed and are negative except as stated in the HPI or noted below.   Constitutional; Neg for fevers   Eyes: Neg for vision changes. Neg for blurry vision.   Ears, nose, mouth, throat: Neg for sore throat  Cardiovascular: Neg for chest pain.   Respiratory: Neg for cough. Neg for shortness of breath.   Gastrointestinal: Neg for abdominal pain. Neg for nausea or vomiting.   Genitourinary: Neg for dysuria. Neg for hematuria  Musculoskeletal: Neg for joint pain. Neg for swelling   Skin: Neg for rashes  Neurological: Neg for focal numbness or weakness    Home Medications:    Current Facility-Administered Medications:     acetaminophen (TYLENOL) tablet 650 mg, 650 mg, Oral, Q4H PRN, Hoyle Barr, MD, 650 mg at 04/07/23 1610    acetaminophen (TYLENOL) tablet 650 mg, 650 mg, Oral, Once, Marguerite Olea, MD    [Provider Hold] azathioprine [facility]) tablet 100 mg, 100 mg, Oral, Daily, Hoyle Barr, MD, 100 mg at 04/06/23 2041    diphenhydrAMINE (BENADRYL) capsule/tablet 25 mg, 25 mg, Oral, BID PRN, Starleen Blue, MD, 25 mg at 04/06/23 2154    escitalopram oxalate (LEXAPRO) tablet 10 mg, 10 mg, Oral, Daily, Hoyle Barr, MD, 10 mg at 04/07/23 9604    folic acid (FOLVITE) tablet 1 mg, 1 mg, Oral, Daily, Short, Samuel, MD, 1 mg at 04/07/23 0827    guaiFENesin (ROBITUSSIN) oral syrup, 200 mg, Oral, Q4H PRN, Hoyle Barr, MD    lactated ringers bolus 1,000 mL, 1,000 mL, Intravenous, Once, Marguerite Olea, MD, 1,000 mL at 04/07/23 0941    melatonin tablet 3 mg, 3 mg, Oral, QPM, Hoyle Barr, MD, 3 mg at 04/06/23 2042    norgestimate-ethinyl estradiol 0.25 mg-35 mcg tablet, 1 tablet, Oral, TID, Short, Remi Deter, MD, 1 tablet at 04/07/23 0600    ondansetron (ZOFRAN-ODT) disintegrating tablet 4 mg, 4 mg, Oral, Q8H PRN **OR** ondansetron (ZOFRAN) injection 4 mg, 4 mg, Intravenous, Q8H PRN, Winslow, Marlie C, MD    sodium bicarbonate tablet 1,300 mg, 1,300 mg, Oral, BID, Winslow, Marlie C, MD, 1,300 mg at 04/07/23 0600    sodium zirconium cyclosilicate (LOKELMA) packet 10 g, 10 g, Oral, BID, Short, Samuel, MD, 10 g at 04/07/23 5409    tacrolimus (ENVARSUS XR) extended release tablet 16 mg, 16 mg, Oral, Daily, Jen Mow C, MD, 16 mg at 04/07/23 8119    tacrolimus (ENVARSUS XR) extended release tablet 2 mg, 2 mg, Oral, Daily, Hoyle Barr, MD, 2 mg at 04/07/23 1478    ALLERGIES:   Iodinated contrast media, Nickel, Privigen [immun glob g(igg)-pro-iga 0-50], Propranolol, Eye irrigating solution [ophthalmic irrigation solution], Iodine, Naltrexone, and Uni-cortrom       Physical Exam     ED Triage Vitals [04/06/23 1058]   Enc Vitals Group      BP 118/66      Heart Rate 81      SpO2 Pulse       Resp 16      Temp 36.5 ??C (97.7 ??F)      Temp src       SpO2 99 %      Weight 72.6 kg (160 lb)      Height        Vital signs reviewed.  GEN: AOx3. Well-nourished, well-developed. No apparent  distress. Speaking in full sentences.  EYES: EOM grossly intact. Pupils round and reactive. No scleral icterus. Mild conjunctival pallor.  HEAD/NECK /MOUTH: [place]/AT. No pharyngeal erythema. Moist mucous membranes.  CARDIO: RRR. Normal capillary refill. No peripheral edema or unilateral lower extremity swelling.  LUNG: CTAB. No retractions or accessory muscle use  GI: No significant tenderness to palpation of all four quadrants of the abdomen. Non-distended. No masses, rigidity, guarding or rebound. Murphy's and McBurney's point negative.   MSK: Full range of motion. Moving all extremities. No swelling or deformity.  SKIN: Intact. No rashes. No lesions. No hematomas, no ecchymosis, no petechiae.  NEURO: AOx3. No facial asymmetry; no focal motor or sensory deficits.  PSYCH: Awake and alert. Appropriate mood and affect.      LABS AND ED TREATMENT:   Lab reviewed and interpreted by me as above in MDM.   Medications given in the ED:   Medications   acetaminophen (TYLENOL) tablet 650 mg (650 mg Oral Given 04/07/23 0834)   melatonin tablet 3 mg (3 mg Oral Given 04/06/23 2042)   ondansetron (ZOFRAN-ODT) disintegrating tablet 4 mg (has no administration in time range)     Or   ondansetron (ZOFRAN) injection 4 mg (has no administration in time range)   guaiFENesin (ROBITUSSIN) oral syrup (has no administration in time range)   sodium zirconium cyclosilicate (LOKELMA) packet 10 g (10 g Oral Given 04/07/23 0829)   azathioprine (IMURAN) tablet 100 mg ( Oral Provider Hold Dose 04/12/23 0900)   escitalopram oxalate (LEXAPRO) tablet 10 mg (10 mg Oral Given 04/07/23 0828)   sodium bicarbonate tablet 1,300 mg (1,300 mg Oral Given 04/07/23 0600)   tacrolimus (ENVARSUS XR) extended release tablet 2 mg (2 mg Oral Given 04/07/23 0828)   tacrolimus (ENVARSUS XR) extended release tablet 16 mg (16 mg Oral Given 04/07/23 0826)   norgestimate-ethinyl estradiol 0.25 mg-35 mcg tablet (1 tablet Oral Given 04/07/23 0600)   diphenhydrAMINE (BENADRYL) capsule/tablet 25 mg (25 mg Oral Given 04/06/23 2154)   folic acid (FOLVITE) tablet 1 mg (1 mg Oral Given 04/07/23 0827)   lactated ringers bolus 1,000 mL (1,000 mL Intravenous New Bag 04/07/23 0941)   acetaminophen (TYLENOL) tablet 650 mg (650 mg Oral Not Given 04/07/23 0930)          Radiology     US Renal Transplant W Doppler   Final Result   1. Stable resistive indices in the renal transplant arteries, all of which remain within normal limits/upper limits of normal.   2. Decreased perinephric fluid collection measuring up to 2.5 cm, previously 3.7 cm.      Please see below for data measurements:      Transplant location: LLQ       Renal Transplant: Sagittal 10.0 cm; AP 4.1 cm; Transverse 5.6 cm      Segmental artery superior resistive index: 0.76   Segmental artery mid resistive index: 0.78   Segmental artery inferior resistive index: 0.77      Previous resistive indices range of segmental arteries: 0.70-0.77      Main renal artery peak systolic velocity at anastomosis: 0.76 m/s   Main renal artery hilum resistive index: 0.73   Main renal artery mid resistive index: 0.81   Main renal artery anastomosis resistive index: 0.84      Previous resistive indices range of main renal artery: 0.70-0.87      Main renal vein: patent      Iliac artery: Patent   Iliac vein: Patent  Bladder volume prevoid: 110.2  mL                  [place] Endovaginal (Non-OB)   Final Result   1.  No sonographic evidence to suggest ovarian torsion at this time.   2.  Note made of a small anterior intramural fibroid, measuring up to 1.9 cm in greatest dimension and is essentially unchanged when compared to prior exam.   3.  Small volume free fluid noted within the posterior cul-de-sac, which is nonspecific and may be physiologic in nature.               PVL Venous Duplex Lower Extremity Bilateral    (Results Pending)       I independently visualized the radiology images.      Procedures     None.     Portions of this record have been created using Scientist, clinical (histocompatibility and immunogenetics). Dictation errors have been sought, but may not have been identified and corrected.       Documentation assistance was provided by Vilinda Blanks and Joaquin Music, [employer] on April 06, 2023 at 11:01 AM for Sabino Gasser, MD.     Sabino Gasser, MD  04/07/23 1110

## 2023-04-06 NOTE — Unmapped (Incomplete Revision)
 Nephrology ([facility]) History & Physical    Assessment & Plan:   Elaine Koch is a 38 y.o. female with a past medical history significant for history of stage III CKD, s/p dual kidney/pancreatic transplant in 04/2019 (on immunosuppressants), T1DM, sickle cell trait, and anxiety/depression who presents with new onset heavy vaginal bleeding that started 6 days ago.    Active Problems:    Anemia in stage 3b chronic kidney disease (CMS-HCC)    History of diabetes mellitus, type I    Sickle cell trait (CMS-HCC)    History of simultaneous kidney and pancreas transplant (CMS-HCC)    History of migraine        Active Problems    # New Vaginal Bleeding - Macrocytic Anemia in a Pt w/ Sickle Cell Trait  Intermittent spotting with clots associated with lightheadedness and dizziness for 6 days in addition to 2 weeks of diarrhea.  CBC notable for significant anemia, hemoglobin of 4.8.  Received 2 units at outside emergency department.  Patient has a history of 2 prior D&Cs, 1 in 2024 following a miscarriage and 1 in 2023.  She notes that her last menstrual period was 03/10/2023.  Reports history of ovarian cysts and fibroids. She also takes an intravaginal medication, MetroGel, for recurrent BV three times per week. Patient had normal iron panel 12/2022. Patient has had macrocytic blood cells since 2022. RDW 20.2 (increasing since 12/24). Hemorrhagic ovarian cyst and fibroids found 01/2023 U/S. Endovaginal U/S unremarkable in ED, renal transplant [place] pending. Cervical os closed on exam. Pt has several RF for bleeding including immunosuppressive therapy and poor KFT as well as known macrocytic anemia (iron panel wnl in 2024). Ddx includes fibroid bleed v infectious (endometritis, PID, hemorrhagic cystitis) vs structural (endometrial hyperplasia vs cervical/endometrial polyp, hemorrhagic cyst from previous dx) vs coagulopathy. Pt's platelets 122, decreased from baseline.  Most likely secondary to coagulopathy and known fibroids. - Repeat CBC STAT, then CBC q12h  - Consider blood cx and endometrial cx for bacterial/fungal infections  - Transfuse Hgb < 7  - Continue home MetroGel  -Follow-up zinc, copper  -Consult gynecology  -If bleeding worsens, consider oral contraceptive or TXA    # Diarrhea, Non-Bloody  In addition to new vaginal bleeding, patient reports 2 weeks of diarrhea.  Hyperkalemic on CMP to 5.4.  Diarrhea is nonbloody in nature, severe bloating accompanying. At outside ED, denied recent antibiotic usage, abdominal pain, fevers.  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  Outpatient provider January 2025 recommended SIBO testing if persistent.  Also considered IBS. Ddx IBS, IBD, C Diff, SIBO, or other enteritis (< likely given lack of fever, other infectious s/s).  - Daily CBC, lytes  - F/up Mg, Phos  - F/up C. Difficile/GIPP panel, stool culture  - Consider endoscopy for SIBO testing if does not resolve  -Maintain cautious fluid repletion given anemia  - F/up CMV PCR    # Hx of Hemorrhoids  Patient has history of external large non-thrombosed hemorrhoids. Used OTC gels at home. At recent OP IM visit, denied issues with constipation.    # History of transplant kidney - ESRD - C/F AKI - Electrolyte derangements -   CMP in [facility] emergency department evident for hyperkalemia to 5.4 creatinine increased at 3.33.  Baseline creatinine 2.2.  UA without evidence of infection.  Patient is compliant with all antirejection medications per outside records including MRI on and tacrolimus. 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  Low white blood cell count to 1.1 on admission.  Patient began having decreasing counts beginning in 2023. Patient has low ANC to 0.6 and low monocytes to 0.1. Hgb as above. CTM.  - Consider discontinuing    Chronic Problems    Hx of Transplanted Pancreas  Follows with Dr. Carlene Coria of transplant nephrology. On tacrolimus and azathioprine.    # Depression  Continue home regimen, Lexapro 5 mg daily    # Hypertension  Continue home regimen    # DM1  Continue home regimen    # Hx of Cardiomyopathy  Last echo in 2021 demonstrating normal left ventricle and EF greater than 55%.  Left atrium mildly dilated.  No apparent valvular vegetations.    Chief Concern:   No Principal Problem: There is no principal problem currently on the Problem List. Please update the Problem List and refresh.      Subjective:         Prior to Admission medications    Medication Dose, Route, Frequency   acetaminophen (TYLENOL) 325 MG tablet 650 mg, Oral, Every 4 hours PRN   azathioprine (IMURAN) 50 mg tablet Take 2 tablets (100 mg total) by mouth daily   butalbital-acetaminophen-caffeine (ESGIC) 50-325-40 mg per tablet 1 tablet, Oral, Every 4 hours PRN   escitalopram oxalate (LEXAPRO) 5 MG tablet 10 mg, Oral, Daily (standard)   metroNIDAZOLE (METROGEL) 0.75 % (37.5mg /5 gram) vaginal gel Vaginal, 3 times weekly   sodium bicarbonate 650 mg tablet 1,300 mg, Oral, 3 times a day (standard)   tacrolimus (ENVARSUS XR) 1 mg Tb24 extended release tablet Take 2 tablets (2 mg total) by mouth daily with FOUR 4mg  tablets for total daily dose of 18mg    tacrolimus (ENVARSUS XR) 4 mg Tb24 extended release tablet 16 mg, Oral, Daily (standard), Take with two 1 mg tablets for a total of 18 mg   NIFEdipine (PROCARDIA XL) 30 MG 24 hr tablet 30 mg, Oral, Daily (standard)         Objective:   Physical Exam:  Temp:  [36.5 ??C (97.7 ??F)-36.9 ??C (98.5 ??F)] 36.9 ??C (98.4 ??F)  Heart Rate:  [77-81] 79  SpO2 Pulse:  [77-100] 78  Resp:  [16] 16  BP: (118-138)/(66-84) 138/84  SpO2:  [99 %-100 %] 100 %    Gen: NAD, converses   Eyes: Sclera anicteric, EOMI grossly normal   HENT: Atraumatic, normocephalic  Neck: Trachea midline  Heart: RRR  Lungs: CTAB, no crackles or wheezes  Abdomen: Soft, NTND  Extremities: No edema  Neuro: Grossly symmetric, non-focal    Skin:  No rashes, lesions on clothed exam  Psych: Alert, oriented

## 2023-04-06 NOTE — Unmapped (Signed)
 The patient reports vaginal bleeding starting on Tuesday and has a history of ovarian cyst. She states she has been changing a super plus tampon x 6 per day. Also endorses  dizziness and lightheadedness.

## 2023-04-06 NOTE — Unmapped (Signed)
 Nephrology ([facility]) History & Physical    Assessment & Plan:   Elaine Koch is a 38 y.o. female whose presentation is complicated by T1DM and ESRD s/p deceased donor dual pancreas and kidney transplant (2021, KDPI 34%), sickle cell trait, migraines, anemia, and depression/anxiety that presented to [facility] with vaginal bleeding and diarrhea.     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)    History of diabetes mellitus, type I    Sickle cell trait (CMS-HCC)    History of simultaneous kidney and pancreas transplant (CMS-HCC)    History of migraine    Anemia    Acute kidney injury superimposed on chronic kidney disease (CMS-HCC)    Recurrent Clostridioides difficile diarrhea    Immunosuppressed status (CMS-HCC)    Pancytopenia (CMS-HCC)    Elevated liver enzymes    Depression    Hyperkalemia    Metabolic acidosis    Active Problems    #Abnormal Vaginal Bleeding  #Macrocytic Anemia 2/2 azathioprine  #Recurrent BV  Hgb 4.8 (baseline 8-9), now s/p 2u pRBCs at [facility]. Intermittent spotting with clots associated with lightheadedness and dizziness for 6 days in addition to 2 weeks of diarrhea. Chronic macrocytic anemia since 2022; iron panel Nov 2024 normal. Hx 2 prior D&Cs in 2023 and 2024. LMP 03/10/23. History intramural fibroids and hemorrhagic ovarian cyst (seen on ultrasound Dec 2024). Takes intravaginal medication, MetroGel, for recurrent BV three times per week. At [facility] ED, cervical os closed and TVUS unremarkable.  HCG negative. 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 Differential of acute blood loss includes fibroid bleed, CMV endometritis, menorrhagia, vaginal lac. Possibly with underlying coagulopathy, such as acquired vWD, uremic platelet dysfunction, liver dysfunction (AST/ALT elevated).   - s/p 2u pRBCs at [facility]  - H&H after transfusion, then q12h  - Transfuse for Hgb<8 (higher target due to AKI on renal transplant)  - Type and screen  - starting TID OCP tonight, she is not desiring pregnancy  - Check B12, folate, Cu, Zn, fibrinogen  - Consult GYN in AM  - If significant continued bleeding, tranexemic acid 1.3g TID (up to 5 days)  - Holding Flagyl vaginal gel for now (ordered three times a week)    #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 Carlene Coria. Cr 3.3 (baseline 1.8-2.1). K+ 5.4, bicarb 16.3. UA without evidence of infection. HDS. Suspect ATN 2/2 hypoperfusion injury iso anemia and GI losses vs rejection.  - Spin urine  - Immunosuppression as below  - Volume resuscitation: cautious with IVF. Will replete with pRBC as appropriate and secondarily add IVF if tachy or low MAP  - Lokelma BID  - Continue home bicarb 1300mg  TID    # Diarrhea, Non-Bloody - Hx Recurrent C.diff   2 weeks of non-bloody diarrhea a/w severe bloating.  No recent antibiotic usage, abdominal pain, fevers.  History of recurrent C. difficile for which she received fecal transplants in 2022. Outpatient provider January 2025 recommended SIBO testing if persistent.  Also considered IBS.  - GIPP/C.diff/CMV PCR    #Immunosuppression - Pancytopenia - Neutropenia   WBC 1.1, ANC 0.6. Azathioprine likely contributing to chronic macrocytic anemia.  - Granulocyte colony-stimulating factor  - Cont home Tacrolimus 18mg  daily: goal 8-10 given her pancreas transplant  - Cont Azathioprine 100 mg daily  -  Not on MMF due to desire for pregnancy.    #Elevated AST/ALT   75/107 in ER, from normal baseline in Dec 2024. Will continue to trend, obtain RUQ [place] if persistent despite resuscitation.     Chronic Problems  #Type 1 DM s/p pancreas transplant 2021: Amylase, lipase, C-peptide wnl at outpatient visit. Immunosuppression as above.    #History of Migraines: Home butalbital-acetaminophen-caffeine currently held    #Depression/anxiety: Stable on Lexapro, she has been working with Dr. Neale Burly with plans to establish with mental health locally     # Hx of Hemorrhoids: Patient has history of external large non-thrombosed hemorrhoids. Used OTC gels at home. At recent OP IM visit, denied issues with constipation.    #Sickle Cell trait: Noncontributory    #Continuous murmur at RUSB and LUSB: Noted on prior admissions. Possibly a referred murmur from RUE AV fistula, with flow murmur in setting of severe anemia. Last TTE in 2021 was unremarkable.       Checklist:  Diet: Regular Diet  DVT PPx: Contraindicated - High Risk for Bleeding/Active Bleeding  Code Status: Full Code  Dispo: Floor    Team Contact Information:   Primary Team: Nephrology ([facility])  Primary Resident: Jacinto Reap, MD  Resident's Pager: [phone] (Nephrology Intern - Tower)    Chief Concern:   Vaginal bleeding      Subjective:   Elaine Koch is a 38 y.o. female with pertinent PMHx of T1DM, ESRD s/p deceased donor dual pancreas and kidney transplant (2021, KDPI 34%), sickle cell trait, migraines, anemia, and depression/anxietypresenting with vaginal bleeding.    History provided by patient.       HPI:  Per ED physician signout. Intermittent spotting with clots associated with lightheadedness and dizziness for 6 days in addition to 2 weeks of diarrhea.  CBC notable for significant anemia, hemoglobin of 4.8.  Received 2 units at outside emergency department.  Patient has a history of 2 prior D&Cs, 1 in 2024 following a miscarriage and 1 in 2023.  She notes that her last menstrual period was 03/10/2023.  Reports history of ovarian cysts and fibroids. She also takes an intravaginal medication, MetroGel, for recurrent BV three times per week. Patient had normal iron panel 12/2022. Patient has had macrocytic blood cells since 2022. RDW 20.2 (increasing since 12/24). Hemorrhagic ovarian cyst and fibroids found 01/2023 U/S. Endovaginal U/S unremarkable in ED, renal transplant [place] pending. Cervical os closed on exam.    At bedside, Kareem feeling okay without any localizing symptoms.  Her vaginal bleeding is still ongoing and has not appreciably improved.    She says that she was in her normal state of health until about 1 and half weeks ago when she started having diarrhea.  This started maybe 2-3 bowel movements a day, but now has progressed to about 7.  Very watery, some yellow and seedy components to this.  No fevers or chills.  No blood.    6 days ago, she noticed that she was starting to have some vaginal bleeding which was unexpected for her as she expected her period later in the month.  Usual periods for her or 2 days of light bleeding every 4 weeks, but this has persisted for about 6 days of heavy bleeding.  She is using a super maxi tampon every 20 minutes.  Since coming over here, she is continuing to bleed.    No family history of substantial uterine bleeding among the women in her family except in a cousin that has  a known fibroid.    ---  On arrival to [facility], she has a bit of a headache and would like some Tylenol. Continuing to need to change out Maxi tampon frequently, maybe every hour now. No current lightheadedness, though she had some earlier. She does feel better after 2U of PRBCs that were given in ER.       Pertinent Surgical Hx  11/19/22 - PR SURG RX MISSED ABORTN,1ST TRI     03/20/21 - PR INDUCED ABORTN BY DIL/EVAC     11/17/20 - PR SURG RX MISSED ABORTN,1ST TRI - Vacuum aspiration    06/19/20 - PR PREPARE FECAL MICROBIOTA FOR INSTILLATION     02/14/20 - PR FECAL MICROBIOTA PREP INSTIL     2019-05-09 - Transplant of deceased donor kidney and pancreas      Pertinent Family Hx  Family History   Problem Relation Age of Onset    Diabetes Mother     Diabetes Father     Cancer Maternal Grandmother     Diabetes Maternal Grandfather     Diabetes Paternal Grandmother           Allergies  Iodinated contrast media, Nickel, Privigen [immun glob g(igg)-pro-iga 0-50], Propranolol, Eye irrigating solution [ophthalmic irrigation solution], Iodine, Naltrexone, and Uni-cortrom    I reviewed the Medication List. The current list is Accurate    Designated Healthcare Decision Maker:  Ms. Gutterman currently has decisional capacity for healthcare decision-making and is able to designate a surrogate healthcare decision maker. Ms. Sebring designated healthcare decision maker(s) is/are Levonne Lapping (the patient's other adult) as denoted by stated patient preference.    Objective:   Physical Exam:  Temp:  [36.4 ??C (97.5 ??F)-36.9 ??C (98.5 ??F)] 36.8 ??C (98.2 ??F)  Heart Rate:  [72-81] 76  SpO2 Pulse:  [74-100] 74  Resp:  [16-18] 18  BP: (118-145)/(66-87) 134/76  SpO2:  [99 %-100 %] 100 %      Gen: NAD, converses easily on RA  Eyes: Sclera anicteric, EOMI grossly normal, +conjunctival pallor  HENT: Atraumatic, normocephalic  Heart: RRR with loud pansystolic murmur of R and LUSB  Lungs: CTAB, no crackles or wheezes  Abdomen: Soft, non-distended, mild tenderness to palpation of lower quadrants (transplanted kidney on left)  Extremities: No edema, warm  Neuro: Grossly symmetric, non-focal    Skin:  No rashes, lesions on clothed exam  Psych: Alert, oriented

## 2023-04-07 LAB — HEPATIC FUNCTION PANEL
ALBUMIN: 3.8 g/dL (ref 3.4–5.0)
ALKALINE PHOSPHATASE: 56 U/L (ref 46–116)
ALT (SGPT): 84 U/L — ABNORMAL HIGH (ref 10–49)
AST (SGOT): 50 U/L — ABNORMAL HIGH (ref <=34)
BILIRUBIN DIRECT: 0.6 mg/dL — ABNORMAL HIGH (ref 0.00–0.30)
BILIRUBIN TOTAL: 2 mg/dL — ABNORMAL HIGH (ref 0.3–1.2)
PROTEIN TOTAL: 6.7 g/dL (ref 5.7–8.2)

## 2023-04-07 LAB — SLIDE REVIEW

## 2023-04-07 LAB — PHOSPHORUS: PHOSPHORUS: 4.7 mg/dL (ref 2.4–5.1)

## 2023-04-07 LAB — TACROLIMUS LEVEL, TROUGH: TACROLIMUS, TROUGH: 10.5 ng/mL (ref 5.0–15.0)

## 2023-04-07 LAB — BASIC METABOLIC PANEL
ANION GAP: 11 mmol/L (ref 5–14)
BLOOD UREA NITROGEN: 41 mg/dL — ABNORMAL HIGH (ref 9–23)
BUN / CREAT RATIO: 13
CALCIUM: 8.9 mg/dL (ref 8.7–10.4)
CHLORIDE: 111 mmol/L — ABNORMAL HIGH (ref 98–107)
CO2: 19 mmol/L — ABNORMAL LOW (ref 20.0–31.0)
CREATININE: 3.12 mg/dL — ABNORMAL HIGH (ref 0.55–1.02)
EGFR CKD-EPI (2021) FEMALE: 19 mL/min/{1.73_m2} — ABNORMAL LOW (ref >=60)
GLUCOSE RANDOM: 90 mg/dL (ref 70–179)
POTASSIUM: 5.4 mmol/L — ABNORMAL HIGH (ref 3.4–4.8)
SODIUM: 141 mmol/L (ref 135–145)

## 2023-04-07 LAB — HEMOGLOBIN AND HEMATOCRIT, BLOOD
HEMATOCRIT: 21.3 % — ABNORMAL LOW (ref 34.0–44.0)
HEMOGLOBIN: 7.8 g/dL — ABNORMAL LOW (ref 11.3–14.9)

## 2023-04-07 LAB — CBC W/ AUTO DIFF
BASOPHILS ABSOLUTE COUNT: 0 10*9/L (ref 0.0–0.1)
BASOPHILS RELATIVE PERCENT: 1.9 %
EOSINOPHILS ABSOLUTE COUNT: 0.1 10*9/L (ref 0.0–0.5)
EOSINOPHILS RELATIVE PERCENT: 12.2 %
HEMATOCRIT: 21.4 % — ABNORMAL LOW (ref 34.0–44.0)
HEMOGLOBIN: 7.8 g/dL — ABNORMAL LOW (ref 11.3–14.9)
LYMPHOCYTES ABSOLUTE COUNT: 0.3 10*9/L — ABNORMAL LOW (ref 1.1–3.6)
LYMPHOCYTES RELATIVE PERCENT: 37.7 %
MEAN CORPUSCULAR HEMOGLOBIN CONC: 36.6 g/dL — ABNORMAL HIGH (ref 32.0–36.0)
MEAN CORPUSCULAR HEMOGLOBIN: 35.6 pg — ABNORMAL HIGH (ref 25.9–32.4)
MEAN CORPUSCULAR VOLUME: 97.3 fL — ABNORMAL HIGH (ref 77.6–95.7)
MEAN PLATELET VOLUME: 8.2 fL (ref 6.8–10.7)
MONOCYTES ABSOLUTE COUNT: 0 10*9/L — ABNORMAL LOW (ref 0.3–0.8)
MONOCYTES RELATIVE PERCENT: 6.1 %
NEUTROPHILS ABSOLUTE COUNT: 0.3 10*9/L — CL (ref 1.8–7.8)
NEUTROPHILS RELATIVE PERCENT: 42.1 %
PLATELET COUNT: 87 10*9/L — ABNORMAL LOW (ref 150–450)
RED BLOOD CELL COUNT: 2.2 10*12/L — ABNORMAL LOW (ref 3.95–5.13)
RED CELL DISTRIBUTION WIDTH: 21.3 % — ABNORMAL HIGH (ref 12.2–15.2)
WBC ADJUSTED: 0.7 10*9/L — ABNORMAL LOW (ref 3.6–11.2)

## 2023-04-07 LAB — LIPASE: LIPASE: 25 U/L (ref 12–53)

## 2023-04-07 LAB — RETICULOCYTES
RETICULOCYTE ABSOLUTE COUNT: 12.9 10*9/L — ABNORMAL LOW (ref 23.0–100.0)
RETICULOCYTE COUNT PCT: 0.59 % (ref 0.50–2.17)

## 2023-04-07 LAB — AMYLASE: AMYLASE: 102 U/L (ref 30–118)

## 2023-04-07 LAB — MAGNESIUM: MAGNESIUM: 2 mg/dL (ref 1.6–2.6)

## 2023-04-07 MED ADMIN — escitalopram oxalate (LEXAPRO) tablet 10 mg: 10 mg | ORAL | @ 13:00:00

## 2023-04-07 MED ADMIN — norgestimate-ethinyl estradiol 0.25 mg-35 mcg tablet: 1 | ORAL | @ 18:00:00 | Stop: 2023-04-13

## 2023-04-07 MED ADMIN — metroNIDAZOLE (FLAGYL) tablet 500 mg: 500 mg | ORAL | @ 18:00:00 | Stop: 2023-04-14

## 2023-04-07 MED ADMIN — sodium zirconium cyclosilicate (LOKELMA) packet 10 g: 10 g | ORAL | @ 13:00:00 | Stop: 2023-04-08

## 2023-04-07 MED ADMIN — lactated ringers bolus 1,000 mL: 1000 mL | INTRAVENOUS | @ 15:00:00 | Stop: 2023-04-07

## 2023-04-07 MED ADMIN — tacrolimus (ENVARSUS XR) extended release tablet 2 mg: 2 mg | ORAL | @ 13:00:00

## 2023-04-07 MED ADMIN — melatonin tablet 3 mg: 3 mg | ORAL | @ 23:00:00

## 2023-04-07 MED ADMIN — tacrolimus (ENVARSUS XR) extended release tablet 16 mg: 16 mg | ORAL | @ 02:00:00

## 2023-04-07 MED ADMIN — sodium zirconium cyclosilicate (LOKELMA) packet 10 g: 10 g | ORAL | @ 04:00:00 | Stop: 2023-04-08

## 2023-04-07 MED ADMIN — HYDROmorphone (DILAUDID) tablet 1 mg: 1 mg | ORAL | @ 23:00:00 | Stop: 2023-04-07

## 2023-04-07 MED ADMIN — methocarbamol (ROBAXIN) tablet 500 mg: 500 mg | ORAL | @ 21:00:00

## 2023-04-07 MED ADMIN — metoclopramide (REGLAN) injection 10 mg: 10 mg | INTRAVENOUS | @ 18:00:00 | Stop: 2023-04-07

## 2023-04-07 MED ADMIN — norgestimate-ethinyl estradiol 0.25 mg-35 mcg tablet: 1 | ORAL | @ 11:00:00 | Stop: 2023-04-13

## 2023-04-07 MED ADMIN — sodium bicarbonate tablet 1,300 mg: 1300 mg | ORAL | @ 18:00:00

## 2023-04-07 MED ADMIN — diphenhydrAMINE (BENADRYL) capsule/tablet 25 mg: 25 mg | ORAL | @ 03:00:00

## 2023-04-07 MED ADMIN — acetaminophen (TYLENOL) tablet 650 mg: 650 mg | ORAL | @ 14:00:00 | Stop: 2023-04-07

## 2023-04-07 MED ADMIN — diphenhydrAMINE (BENADRYL) injection: 50 mg | INTRAVENOUS | @ 18:00:00 | Stop: 2023-04-07

## 2023-04-07 MED ADMIN — sodium bicarbonate tablet 1,300 mg: 1300 mg | ORAL | @ 11:00:00

## 2023-04-07 MED ADMIN — acetaminophen (TYLENOL) tablet 1,000 mg: 1000 mg | ORAL | @ 18:00:00

## 2023-04-07 MED ADMIN — norgestimate-ethinyl estradiol 0.25 mg-35 mcg tablet: 1 | ORAL | @ 02:00:00 | Stop: 2023-04-13

## 2023-04-07 MED ADMIN — azathioprine (IMURAN) tablet 100 mg: 100 mg | ORAL | @ 02:00:00

## 2023-04-07 MED ADMIN — melatonin tablet 3 mg: 3 mg | ORAL | @ 02:00:00

## 2023-04-07 MED ADMIN — tacrolimus (ENVARSUS XR) extended release tablet 16 mg: 16 mg | ORAL | @ 13:00:00

## 2023-04-07 MED ADMIN — tacrolimus (ENVARSUS XR) extended release tablet 2 mg: 2 mg | ORAL | @ 02:00:00

## 2023-04-07 MED ADMIN — sodium bicarbonate tablet 1,300 mg: 1300 mg | ORAL | @ 02:00:00

## 2023-04-07 MED ADMIN — HYDROmorphone (DILAUDID) tablet 1 mg: 1 mg | ORAL | @ 18:00:00 | Stop: 2023-04-07

## 2023-04-07 MED ADMIN — acetaminophen (TYLENOL) tablet 650 mg: 650 mg | ORAL | @ 03:00:00

## 2023-04-07 MED ADMIN — folic acid (FOLVITE) tablet 1 mg: 1 mg | ORAL | @ 13:00:00

## 2023-04-07 MED ADMIN — escitalopram oxalate (LEXAPRO) tablet 10 mg: 10 mg | ORAL | @ 02:00:00

## 2023-04-07 MED ADMIN — medroxyPROGESTERone (PROVERA) tablet 20 mg: 20 mg | ORAL | @ 23:00:00 | Stop: 2023-04-14

## 2023-04-07 NOTE — Unmapped (Signed)
 Nephrology ([facility]) Progress Note    Assessment & Plan:   Elaine Koch is a 38 y.o. female whose presentation is complicated by T1DM and ESRD s/p deceased donor dual pancreas and kidney transplant (2021, KDPI 34%), sickle cell trait, migraines, anemia, and depression/anxiety that presented to [facility] with vaginal bleeding and diarrhea.    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)    History of diabetes mellitus, type I    Sickle cell trait (CMS-HCC)    History of simultaneous kidney and pancreas transplant (CMS-HCC)    History of migraine    Anemia    Acute kidney injury superimposed on chronic kidney disease (CMS-HCC)    Recurrent Clostridioides difficile diarrhea    Immunosuppressed status (CMS-HCC)    Pancytopenia (CMS-HCC)    Elevated liver enzymes    Depression    Hyperkalemia    Metabolic acidosis      Active Problems    #Abnormal Vaginal Bleeding  #Macrocytic Anemia 2/2 azathioprine  #Recurrent BV  Hgb 4.8 on admission (baseline 8-9), now s/p 3u pRBCs with appropriate response to 7.8. Intermittent spotting with clots associated with lightheadedness and dizziness for 6 days in addition to 2 weeks of diarrhea. Chronic macrocytic anemia since 2022; iron panel Nov 2024 normal; B12 normal, folate low. Hx 2 prior D&Cs in 2023 and 2024. LMP 03/10/23. History intramural fibroids and hemorrhagic ovarian cyst (seen on ultrasound Dec 2024). Takes intravaginal medication, MetroGel, for recurrent BV three times per week. At [facility] ED, cervical os closed and TVUS unremarkable.  HCG negative. 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 Differential of acute blood loss includes fibroid bleed, CMV endometritis, menorrhagia, vaginal lac. Possibly with underlying coagulopathy, such as acquired vWD, uremic platelet dysfunction, liver dysfunction (AST/ALT elevated), though this is less likely given no history of bleeding before this episode.   - START Provera taper per Gynecology   + Week 1: Provera 20 mg TID x7 days  + Week 2: Provera 20 mg BID x7 days  + Week 3: Provera 20 mg daily until outpatient clinic follow-up  - s/p 3u pRBCs with appropriate response  - Transfuse for Hgb<8 (higher target due to AKI on renal transplant)  - Maintain active type and screen  - HOLD OCP TID taper 2/23, she is not desiring pregnancy  - GYN consulted, appreciate recs  - If significant continued bleeding, tranexemic acid 1.3g TID (up to 5 days)  - Holding Flagyl vaginal gel for now (ordered three times a week); Start PO Flagyl  - Holding Azathioprine due to pancytopenia, as below  - Follow up Cu, Zn, fibrinogen     #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 Carlene Coria. Cr 3.3 (baseline 1.8-2.1). K+ 5.4, bicarb 16.3. UA without evidence of infection. Suspect ATN 2/2 hypoperfusion injury iso anemia and GI losses vs rejection. Hyperkalemic acidosis likely 2/2 tubular injury; diarrhea (below) contributing to metabolic acidosis and hypovolemia. HDS, clinically somewhat hypovolemic.  - No indications for emergent HD  - Spin urine  - Volume resuscitation: 1L LR bolus  - Lokelma BID  - Continue home bicarb 1300mg  TID  - Immunosuppression as below     # Diarrhea, Non-Bloody - Hx Recurrent C.diff  2 weeks of non-bloody diarrhea a/w severe bloating.  No recent antibiotic usage, abdominal pain,  fevers.  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 Outpatient provider January 2025 recommended SIBO testing if persistent.  Also considered IBS. Will perform basic infectious workup and provide supportive care at this time.  - GIPP/C.diff/CMV PCR  - Fluid management as above     #Immunosuppression - Pancytopenia - Neutropenia  WBC 0.7, ANC 0.3. Pancytopenia from azathioprine, currently held. Will discuss with her transplant nephrologist and consider switch to MMF and potential treatment with GCSF.  - START Filgrastrim 300 mcg x1  [ ]  if ANC<0.5 tomorrow, repeat filgastrim tomorrow  - Cont home Tacrolimus 18mg  daily: goal 8-10 given her pancreas transplant  - Holding Azathioprine 100 mg daily  - Not on MMF due to desire for pregnancy, but patient does not desire pregnancy at this time; ongoing discussion, but may be open to switch from AZA to MMF.     #Elevated AST/ALT   75/107 in ER, from normal baseline in Dec 2024. Likely shock liver from hypovolemia and anemia. Will continue to trend, obtain RUQ [place] if persistent despite resuscitation. Trending down with resuscitation.     #Pain management - Headache - History of Migraines:  Chronic leg pain since 2022 and history of migraines. Reports headache and leg pain this morning. Received Tylenol, Benadryl and Reglan. Home pain regimen: none  - Tylenol 1000mg  TID scheduled  - Methocarbamol 500mg  q8 prn muscle pain  - Dilaudid 1mg  q6 prn severe pain not relieved by Tylenol  - Holding home butalbital-acetaminophen-caffeine    Chronic Problems  #Type 1 DM s/p pancreas transplant 2021: Amylase, lipase, C-peptide wnl at outpatient visit; lipase on admission wnl. Immunosuppression as above.     #Depression/anxiety: Stable on Lexapro, she has been working with Dr. Neale Burly with plans to establish with mental health locally      # Hx of Hemorrhoids: Patient has history of external large non-thrombosed hemorrhoids. Used OTC gels at home. At recent OP IM visit, denied issues with constipation.     #Sickle Cell trait: Noncontributory     #Continuous murmur at RUSB and LUSB: Noted on prior admissions. Possibly a referred murmur from RUE AV fistula, with flow murmur in setting of severe anemia. Last TTE in 2021 was unremarkable.         Daily Checklist:  Diet: Regular Diet  DVT PPx: Contraindicated - High Risk for Bleeding/Active Bleeding  Electrolytes: No Repletion Needed  Code Status: Full Code  Dispo:  Floor    Team Contact Information:   Primary Team: Nephrology ([facility])  Primary Resident: Jen Mow, MD  Resident's Pager: [phone] (Nephrology Intern - Tower)    Interval History:   No acute events overnight. Feeling somewhat better after receiving blood. Diarrhea decreased since coming in, only 1 episode overnight. Minimal appetite (ate 2 bites of a sandwich yesterday). Bleeding ongoing, but seems to have slowed - now noticing clotted blood on maxi pad and in urine. Complains of leg pain, which has been a chronic issue since 2022.    Objective:   Temp:  [36.4 ??C (97.5 ??F)-36.9 ??C (98.4 ??F)] 36.6 ??C (97.9 ??F)  Heart Rate:  [71-80] 73  SpO2 Pulse:  [74-76] 74  Resp:  [16-18] 16  BP: (129-168)/(76-89) 139/89  SpO2:  [100 %] 100 %,   Intake/Output Summary (Last 24 hours) at 04/07/2023 1541  Last data filed at 04/07/2023 1130  Gross per 24 hour   Intake 270 ml   Output 400 ml   Net -130 ml   ,  Wt Readings from Last 3 Encounters:   04/06/23 62.5 kg (137 lb 10.8 oz)   03/14/23 66.9 kg (147 lb 6.4 oz)   03/07/23 67.3 kg (148 lb 6.4 oz)       Gen: NAD, converses pleasantly  HENT: atraumatic, normocephalic  Heart: RRR  Lungs: CTAB, no crackles or wheezes  Abdomen: soft, NTND  Extremities: No edema    Alfredo Batty, MS4

## 2023-04-07 NOTE — Unmapped (Signed)
 Hello!    Could you help with scheduling this patient for a new GYN appointment with the next available [facility] provider in the next 3-4 weeks?    Thank you so much!  Dorene Grebe

## 2023-04-07 NOTE — Unmapped (Signed)
 Patient alert and oriented, remains afebrile. c/o headache and leg pain. Prn medication given as needed. C/o nausea and vomiting once. Vaginal bleeding is less then before. Received 1L LR bolus. Unable to collect stool specimens. Will continue to monitor.    Problem: Adult Inpatient Plan of Care  Goal: Plan of Care Review  04/07/2023 1659 by Suan Halter, RN  Outcome: Progressing  04/07/2023 1656 by Suan Halter, RN  Outcome: Progressing  Goal: Patient-Specific Goal (Individualized)  04/07/2023 1659 by Suan Halter, RN  Outcome: Progressing  04/07/2023 1656 by Suan Halter, RN  Outcome: Progressing  Goal: Absence of Hospital-Acquired Illness or Injury  04/07/2023 1659 by Suan Halter, RN  Outcome: Progressing  04/07/2023 1656 by Suan Halter, RN  Outcome: Progressing  Goal: Optimal Comfort and Wellbeing  04/07/2023 1659 by Suan Halter, RN  Outcome: Progressing  04/07/2023 1656 by Suan Halter, RN  Outcome: Progressing  Goal: Readiness for Transition of Care  04/07/2023 1659 by Suan Halter, RN  Outcome: Progressing  04/07/2023 1656 by Suan Halter, RN  Outcome: Progressing  Goal: Rounds/Family Conference  04/07/2023 1659 by Suan Halter, RN  Outcome: Progressing  04/07/2023 1656 by Suan Halter, RN  Outcome: Progressing     Problem: Infection  Goal: Absence of Infection Signs and Symptoms  04/07/2023 1659 by Suan Halter, RN  Outcome: Progressing  04/07/2023 1656 by Suan Halter, RN  Outcome: Progressing     Problem: Fluid Volume Deficit  Goal: Fluid Balance  Outcome: Progressing     Problem: Pain Acute  Goal: Optimal Pain Control and Function  Outcome: Progressing

## 2023-04-07 NOTE — Unmapped (Signed)
 OB/GYN Consult Note    Requesting Service:  Medicine  Requesting Attending: Posey Boyer True, MD  Reason for Consult: vaginal bleeding    ASSESSMENT & RECOMMENDATIONS     Elaine Koch is a 39 y.o. [id number] presenting with vaginal bleeding.     AUB  - Pt with vaginal bleeding since last week   - Hgb 4.8 on admission > s/p 3u pRBCs > hgb 7.8 this AM  - TVUS completed (2/23): small anterior intramural fibroid, measuring up to 1.9 cm in greatest dimension and is essentially unchanged when compared to prior exam   - Patient has long history of AUB since menarche although has never been fully worked up by an OB/GYN. Likely given the patient's medical comorbidities, the etiology of her AUB is multifactorial  - Pt was started on cOCP taper by primary team with minimal improvement in bleeding.   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   - Recommend starting Provera taper as follows: Provera 20 mg TID x7 days > Provera 20 mg BID x7 days > Provera 20 mg daily until seen in clinic.  - Will plan on close outpatient fu, and message will be sent to clinic to arrange this  - Please obtain pad counts while inpatient and continue to trend hgb to stabilization. If patient's bleeding significantly worsens, please repage the GYN consult pager.     Discussed with attending Dr. Maryelizabeth Kaufmann who is in agreement with the assessment and plan.    Thank you for the opportunity to participate in the care of this patient. Please page the OB/GYN consult service at [phone] with any questions or concerns.    HISTORY     Chief Complaint:   Chief Complaint   Patient presents with    Vaginal Bleeding       History of Present Illness:  Elaine Koch is a 38 y.o. [id number] who is admitted to the medicine service for vaginal bleeding and diarrhea.     Pt reports a long history of irregular menses since menarche at around 38 years old. She states that she will often skip 1-2 months at a time and will subsequently have heavy bleeding when she gets her period. She reports that last week, she started having heavy bleeding with passage of clots and subsequently felt lightheaded and dizzy, so she decided to come in for evaluation.     No abdominal pain, fever, chills, or other complaints. Pt previously had IUD in place that controlled her bleeding. 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;  Service: Gastroenterology    PR COLONOSCOPY FLX DX W/COLLJ SPEC WHEN PFRMD N/A 06/19/2020    Procedure: COLONOSCOPY, FLEXIBLE, PROXIMAL TO SPLENIC FLEXURE; DIAGNOSTIC, W/WO COLLECTION SPECIMEN BY BRUSH OR WASH;  Surgeon: Carmon Ginsberg, MD;  Location: [facility];  Service: Gastroenterology    PR FECAL MICROBIOTA PREP INSTIL N/A 02/14/2020    Procedure: PREP W INSTILLATION FECAL MICROBIOTA, ANY METHOD;  Surgeon: Carmon Ginsberg, MD;  Location: [facility];  Service: Gastroenterology    PR INDUCED ABORTN BY DIL/EVAC N/A 03/20/2021    Procedure: DILATION AND EVACUATION - INDUCED;  Surgeon: Gaynelle Cage, MD;  Location: [facility];  Service: Family Planning    PR LAP,DIAGNOSTIC ABDOMEN N/A 11/17/2020    Procedure: DIAGNOSTIC AND OR OPERATIVE LAPAROSCOPY;  Surgeon: Estil Daft, MD;  Location: MAIN OR [facility];  Service: Womens Primary Gynecology    PR PREPARE FECAL MICROBIOTA FOR INSTILLATION  06/19/2020    Procedure: PREP FECAL MICROBIOTA FOR INSTILLATION, INCLUDING ASSESSMENT OF DONOR SPECIMEN;  Surgeon: Carmon Ginsberg, MD;  Location: [facility];  Service: Gastroenterology    PR SURG RX MISSED ABORTN,1ST TRI N/A 11/17/2020    Procedure: VACUUM ASPIRATION;  Surgeon: Estil Daft, MD;  Location: MAIN OR [facility];  Service: [facility] Primary Gynecology    PR SURG RX MISSED ABORTN,1ST TRI N/A 11/19/2022    Procedure: TREATMENT OF MISSED ABORTION, COMPLETED SURGICALLY; FIRST TRIMESTER;  Surgeon: Asencion Partridge, MD;  Location: [facility] OR [facility];  Service: [facility] Primary Gynecology    PR TRANSPLANT ALLOGRAFT PANCREAS N/A 05/03/2019    Procedure: TRANSPLANTATION OF PANCREATIC ALLOGRAFT;  Surgeon: Leona Carry, MD;  Location: MAIN OR [facility];  Service: Transplant    PR TRANSPLANT,PREP CADAVER RENAL GRAFT N/A 05/03/2019    Procedure: [facility] STD PREP CAD DONR RENAL ALLOGFT PRIOR TO TRNSPLNT, INCL DISSEC/REM PERINEPH FAT, DIAPH/RTPER ATTAC;  Surgeon: Leona Carry, MD;  Location: MAIN OR [facility];  Service: Transplant    PR TRANSPLANT,PREP DONOR PANCREAS N/A 05/03/2019    Procedure: BACKBENCH STANDARD PREPARATION OF CADAVER DONOR PANCREAS ALLOGRAFT PRIOR TO TRANSPLANTATION;  Surgeon: Leona Carry, MD;  Location: MAIN OR [facility];  Service: Transplant    PR TRANSPLANTATION OF KIDNEY N/A 05/03/2019    Procedure: RENAL ALLOTRANSPLANTATION, IMPLANTATION OF GRAFT; WITHOUT RECIPIENT NEPHRECTOMY;  Surgeon: Leona Carry, MD;  Location: MAIN OR [facility];  Service: Transplant    TONSILLECTOMY         Obstetric History:  OB History   Gravida Para Term Preterm AB Living   4 1 0 1 2 1    SAB IAB Ectopic Molar Multiple Live Births   0 1 0 0  1      # Outcome Date GA Lbr Len/2nd Weight Sex Type Anes PTL Lv   4 Gravida            3 IAB 2023           2 AB 11/02/20 [redacted]w[redacted]d          1 Preterm 10/19/15 [redacted]w[redacted]d  830 g (1 lb 13.3 oz) M CS-LTranv Spinal  LIV      Birth Comments: preterm      Obstetric Comments  Denies STI   Denies abnormal pap smear s       Social History:  Social History     Socioeconomic History    Marital status: Single    Number of children: 1    Highest education level: Some college, no degree   Occupational History    Occupation: patient registration   Tobacco Use    Smoking status: Never    Smokeless tobacco: Never   Vaping Use    Vaping status: Never Used   Substance and Sexual Activity    Alcohol use: Never    Drug use: Never    Sexual activity: Not Currently     Partners: Male     Social Drivers of Health     Financial Resource Strain: Low Risk  (04/07/2023)    Overall Financial Resource Strain (CARDIA)     Difficulty of Paying Living Expenses: Not hard at all   Food Insecurity: No Food Insecurity (04/07/2023)    Hunger Vital Sign     Worried About Running Out of Food in the Last Year: Never true     Ran Out of Food in the Last Year: Never true   Transportation Needs: No Transportation Needs (04/07/2023)    PRAPARE - Therapist, art (Medical): No     Lack of Transportation (Non-Medical): No   Physical Activity: Insufficiently Active (04/20/2019)    Exercise Vital Sign     Days of Exercise per Week: 2 days     Minutes of Exercise per Session: 30 min   Stress: No Stress Concern Present (04/20/2019)    [employer] of Occupational Health - Occupational Stress Questionnaire     Feeling of Stress : Only a little   Social Connections: Unknown (04/20/2019)    Social Connection and Isolation Panel [NHANES]     Frequency of Communication with Friends and Family: Patient declined     Frequency of Social Gatherings with Friends and Family: Patient declined     Attends Religious Services: Patient declined     Database administrator or Organizations: Patient declined     Attends Engineer, structural: Patient declined     Marital Status: Patient declined       Family History:  Family History   Problem Relation Age of Onset    Diabetes Mother     Diabetes Father     Cancer Maternal Grandmother     Diabetes Maternal Grandfather     Diabetes Paternal Grandmother         Home Medications:  No current facility-administered medications on file prior to encounter.     Current Outpatient Medications on File Prior to Encounter   Medication Sig    acetaminophen (TYLENOL) 325 MG tablet Take 2 tablets (650 mg total) by mouth every four (4) hours as needed for pain.    azathioprine (IMURAN) 50 mg tablet Take 2 tablets (100 mg total) by mouth daily    butalbital-acetaminophen-caffeine (ESGIC) 50-325-40 mg per tablet Take 1 tablet by mouth every four (4) hours as needed for pain.    escitalopram oxalate (LEXAPRO) 5 MG tablet Take 2 tablets (10 mg total) by mouth daily.    metroNIDAZOLE (METROGEL) 0.75 % (37.5mg /5 gram) vaginal gel Insert into the vagina 3 (three) times a week.    sodium bicarbonate 650 mg tablet Take 2 tablets (1,300 mg total) by mouth Three (3) times a day. (Patient taking differently: Take 2 tablets (1,300 mg total) by mouth  two (2) times a day.)    tacrolimus (ENVARSUS XR) 1 mg Tb24 extended release tablet Take 2 tablets (2 mg total) by mouth daily with FOUR 4mg  tablets for total daily dose of 18mg     tacrolimus (ENVARSUS XR) 4 mg Tb24 extended release tablet Take 4 tablets (16 mg total) by mouth daily. Take with two 1 mg tablets for a total of 18 mg    [DISCONTINUED] NIFEdipine (PROCARDIA XL) 30 MG 24 hr tablet Take 1 tablet (30 mg total) by mouth daily. Allergies:   Allergies   Allergen Reactions    Iodinated Contrast Media Swelling, Rash and Other (See Comments)     Burning, warmth throughout body and tingling.  Throat swelling.  Treated with benadryl and symptoms improved.  Has not had contrast since then (2013 or 2014).    Burning    Nickel Rash    Privigen [Immun Glob G(Igg)-Pro-Iga 0-50] Muscle Pain     PRIVIGEN brand: back and leg pain at usual rate of infusion during first dose, then full body pain again when rechallenged at slower rate of 30 mL/hr    Propranolol Other (See Comments) and Swelling    Eye Irrigating Solution [Ophthalmic Irrigation Solution] Other (See Comments)     Contrast dye (name?) used in eyes caused hot feeling in face, reversed with Benadryl.     Iodine      Other reaction(s): Skin Rash    Naltrexone Rash    Uni-Cortrom Rash       Review of Systems: As per HPI, otherwise negative for balance of 10 systems.    PHYSICAL EXAM     BP 139/89  - Pulse 73  - Temp 36.6 ??C (97.9 ??F) (Oral)  - Resp 16  - Ht 165.1 cm (5' 5)  - Wt 62.5 kg (137 lb 10.8 oz)  - LMP 03/10/2023  - SpO2 100%  - BMI 22.91 kg/m??     Constitutional: No apparent distress.  CV: Normal rate.    Pulm: Normal work of breathing.   Abdominal: Soft, not TTP, no guarding, no rigidity, or no rebound.   Genitourinary: Normal external female genitalia. Mild blood in the vaginal vault. Slow ooze from the cervix.   Extremities: No edema or calf tenderness bilaterally.  Neurological: She is alert and conversational.   Skin: Skin is warm and dry. No rash noted.   Psychiatric: Normal mood and affect.     LABS and IMAGING     Recent Results (from the past 24 hours)   Urine, Pregnancy Qualitatve    Collection Time: 04/06/23 11:14 AM   Result Value Ref Range    Pregnancy Test, Urine Negative Negative   Urinalysis with Microscopy with Culture Reflex    Collection Time: 04/06/23 11:14 AM   Result Value Ref Range    Color, UA Light Yellow     Clarity, UA Clear     Specific Gravity, UA 1.011 1.003 - 1.030    pH, UA 5.0 5.0 - 9.0    Leukocyte Esterase, UA Negative Negative    Nitrite, UA Negative Negative    Protein, UA Trace (A) Negative    Glucose, UA Negative Negative    Ketones, UA Negative Negative    Urobilinogen, UA <2.0 mg/dL <1.6 mg/dL    Bilirubin, UA Negative Negative    Blood, UA Trace (A) Negative    RBC, UA 1 <=4 /HPF    WBC, UA 1 0 - 5 /HPF    Squam Epithel, UA 1  0 - 5 /HPF    Bacteria, UA None Seen None Seen /HPF    Mucus, UA Rare (A) None Seen /HPF   ECG 12 Lead    Collection Time: 04/06/23 11:24 AM   Result Value Ref Range    EKG Systolic BP  mmHg    EKG Diastolic BP  mmHg    EKG Ventricular Rate 84 BPM    EKG Atrial Rate 84 BPM    EKG P-R Interval 164 ms    EKG QRS Duration 72 ms    EKG Q-T Interval 378 ms    EKG QTC Calculation 446 ms    EKG Calculated P Axis 69 degrees    EKG Calculated R Axis 57 degrees    EKG Calculated T Axis 66 degrees    QTC Fredericia 422 ms   Vaginitis Molecular Panel    Collection Time: 04/06/23 11:33 AM    Specimen: Clinician-collected Vaginal Swab   Result Value Ref Range    Bacterial Vaginitis Positive (A) Negative    Candida group NAAT Not Detected Not Detected    Candida glabrata/Candida krusei NAAT Not Detected Not Detected    Trichomonas NAAT Not Detected Not Detected   Comprehensive Metabolic Panel    Collection Time: 04/06/23 11:40 AM   Result Value Ref Range    Sodium 144 135 - 145 mmol/L    Potassium 5.4 (H) 3.4 - 4.8 mmol/L    Chloride 115 (H) 98 - 107 mmol/L    CO2 18.1 (L) 20.0 - 31.0 mmol/L    Anion Gap 11 5 - 14 mmol/L    BUN 43 (H) 9 - 23 mg/dL    Creatinine 6.29 (H) 0.55 - 1.02 mg/dL    BUN/Creatinine Ratio 13     eGFR CKD-EPI (2021) Female 18 (L) >=60 mL/min/1.81m2    Glucose 97 70 - 179 mg/dL    Calcium 9.1 8.7 - 52.8 mg/dL    Albumin 3.8 3.4 - 5.0 g/dL    Total Protein 7.0 5.7 - 8.2 g/dL    Total Bilirubin 1.1 0.3 - 1.2 mg/dL    AST 75 (H) <=41 U/L    ALT 107 (H) 10 - 49 U/L    Alkaline Phosphatase 64 46 - 116 U/L   Lipase Collection Time: 04/06/23 11:40 AM   Result Value Ref Range    Lipase 30 12 - 53 U/L   CBC w/ Differential    Collection Time: 04/06/23 11:40 AM   Result Value Ref Range    WBC 1.1 (L) 3.6 - 11.2 10*9/L    RBC 1.28 (L) 3.95 - 5.13 10*12/L    HGB 4.8 (LL) 11.3 - 14.9 g/dL    HCT 32.4 (LL) 40.1 - 44.0 %    MCV 112.0 (H) 77.6 - 95.7 fL    MCH 37.6 (H) 25.9 - 32.4 pg    MCHC 33.6 32.0 - 36.0 g/dL    RDW 02.7 (H) 25.3 - 15.2 %    MPV 9.5 6.8 - 10.7 fL    Platelet 122 (L) 150 - 450 10*9/L    nRBC 0 <=4 /100 WBCs    Neutrophils % 51.8 %    Lymphocytes % 27.7 %    Monocytes % 6.6 %    Eosinophils % 11.5 %    Basophils % 2.4 %    Absolute Neutrophils 0.6 (L) 1.8 - 7.8 10*9/L    Absolute Lymphocytes 0.3 (L) 1.1 - 3.6 10*9/L    Absolute Monocytes 0.1 (L) 0.3 -  0.8 10*9/L    Absolute Eosinophils 0.1 0.0 - 0.5 10*9/L    Absolute Basophils 0.0 0.0 - 0.1 10*9/L    Macrocytosis Slight (A) Not Present    Anisocytosis Marked (A) Not Present   Type and Screen with Confirmation ABORh    Collection Time: 04/06/23 11:40 AM   Result Value Ref Range    Blood Type O POS     Antibody Screen NEG    hCG, Quantitative, Pregnancy    Collection Time: 04/06/23 11:40 AM   Result Value Ref Range    hCG Quantitative <2.6 mIU/mL   Morphology Review    Collection Time: 04/06/23 11:40 AM   Result Value Ref Range    Smear Review Comments See Comment (A) Undefined   Basic metabolic panel    Collection Time: 04/06/23  1:40 PM   Result Value Ref Range    Sodium 145 135 - 145 mmol/L    Potassium 5.5 (H) 3.4 - 4.8 mmol/L    Chloride 115 (H) 98 - 107 mmol/L    CO2 16.3 (L) 20.0 - 31.0 mmol/L    Anion Gap 14 5 - 14 mmol/L    BUN 41 (H) 9 - 23 mg/dL    Creatinine 1.61 (H) 0.55 - 1.02 mg/dL    BUN/Creatinine Ratio 12     eGFR CKD-EPI (2021) Female 18 (L) >=60 mL/min/1.30m2    Glucose 93 70 - 179 mg/dL    Calcium 9.0 8.7 - 09.6 mg/dL   Magnesium Level    Collection Time: 04/06/23  1:40 PM   Result Value Ref Range    Magnesium 2.0 1.6 - 2.6 mg/dL   Prepare RBC Collection Time: 04/06/23  4:06 PM   Result Value Ref Range    PRODUCT CODE E4540J81     Product ID Red Blood Cells     Spec Expiration [id number]     Status Transfused     Unit # [id number]     Unit Blood Type O Pos     ISBT Number 5100     Crossmatch Compatible     PRODUCT CODE [id number]     Product ID Red Blood Cells     Spec Expiration [id number]     Status Transfused     Unit # [id number]     Unit Blood Type O Pos     ISBT Number 5100     Crossmatch Compatible    Type and Screen with Confirmation ABORh    Collection Time: 04/06/23  7:02 PM   Result Value Ref Range    ABO Grouping O POS     Antibody Screen NEG    Vitamin B12 Level    Collection Time: 04/06/23  7:02 PM   Result Value Ref Range    Vitamin B-12 699 211 - 911 pg/ml   Folate Level    Collection Time: 04/06/23  7:02 PM   Result Value Ref Range    Folate 4.0 (L) >=5.4 ng/mL   Fibrinogen    Collection Time: 04/06/23  7:02 PM   Result Value Ref Range    Fibrinogen 210 175 - 500 mg/dL   Renal Function Panel    Collection Time: 04/06/23  7:02 PM   Result Value Ref Range    Sodium 142 135 - 145 mmol/L    Potassium 5.7 (H) 3.5 - 5.1 mmol/L    Chloride 113 (H) 98 - 107 mmol/L    CO2 17.0 (L) 20.0 - 31.0 mmol/L    Anion Gap  12 5 - 14 mmol/L    BUN 45 (H) 9 - 23 mg/dL    Creatinine 0.98 (H) 0.55 - 1.02 mg/dL    BUN/Creatinine Ratio 14     eGFR CKD-EPI (2021) Female 19 (L) >=60 mL/min/1.47m2    Glucose 90 70 - 179 mg/dL    Calcium 9.2 8.7 - 11.9 mg/dL    Phosphorus 4.7 2.4 - 5.1 mg/dL    Albumin 3.8 3.4 - 5.0 g/dL   CBC    Collection Time: 04/06/23  7:33 PM   Result Value Ref Range    WBC 0.9 (L) 3.6 - 11.2 10*9/L    RBC 1.90 (L) 3.95 - 5.13 10*12/L    HGB 6.8 (L) 11.3 - 14.9 g/dL    HCT 14.7 (L) 82.9 - 44.0 %    MCV 104.7 (H) 77.6 - 95.7 fL    MCH 36.1 (H) 25.9 - 32.4 pg    MCHC 34.5 32.0 - 36.0 g/dL    RDW 56.2 (H) 13.0 - 15.2 %    MPV 8.2 6.8 - 10.7 fL    Platelet 97 (L) 150 - 450 10*9/L   Prepare RBC    Collection Time: 04/06/23 10:49 PM Result Value Ref Range    PRODUCT CODE [id number]     Product ID Red Blood Cells     Spec Expiration [id number]     Status Transfused     Unit # [id number]     Unit Blood Type O Pos     ISBT Number 5100     Crossmatch Compatible    Hepatic Function Panel    Collection Time: 04/07/23  6:29 AM   Result Value Ref Range    Albumin 3.8 3.4 - 5.0 g/dL    Total Protein 6.7 5.7 - 8.2 g/dL    Total Bilirubin 2.0 (H) 0.3 - 1.2 mg/dL    Bilirubin, Direct 3.66 (H) 0.00 - 0.30 mg/dL    AST 50 (H) <=44 U/L    ALT 84 (H) 10 - 49 U/L    Alkaline Phosphatase 56 46 - 116 U/L   Magnesium Level    Collection Time: 04/07/23  6:29 AM   Result Value Ref Range    Magnesium 2.0 1.6 - 2.6 mg/dL   Tacrolimus Level, Trough    Collection Time: 04/07/23  6:29 AM   Result Value Ref Range    Tacrolimus, Trough 10.5 5.0 - 15.0 ng/mL   CBC w/ Differential    Collection Time: 04/07/23  6:29 AM   Result Value Ref Range    WBC 0.7 (L) 3.6 - 11.2 10*9/L    RBC 2.20 (L) 3.95 - 5.13 10*12/L    HGB 7.8 (L) 11.3 - 14.9 g/dL    HCT 03.4 (L) 74.2 - 44.0 %    MCV 97.3 (H) 77.6 - 95.7 fL    MCH 35.6 (H) 25.9 - 32.4 pg    MCHC 36.6 (H) 32.0 - 36.0 g/dL    RDW 59.5 (H) 63.8 - 15.2 %    MPV 8.2 6.8 - 10.7 fL    Platelet 87 (L) 150 - 450 10*9/L    Neutrophils % 42.1 %    Lymphocytes % 37.7 %    Monocytes % 6.1 %    Eosinophils % 12.2 %    Basophils % 1.9 %    Absolute Neutrophils 0.3 (LL) 1.8 - 7.8 10*9/L    Absolute Lymphocytes 0.3 (L) 1.1 - 3.6 10*9/L    Absolute Monocytes 0.0 (L) 0.3 -  0.8 10*9/L    Absolute Eosinophils 0.1 0.0 - 0.5 10*9/L    Absolute Basophils 0.0 0.0 - 0.1 10*9/L    Anisocytosis Moderate (A) Not Present   Phosphorus Level    Collection Time: 04/07/23  6:29 AM   Result Value Ref Range    Phosphorus 4.7 2.4 - 5.1 mg/dL   Basic Metabolic Panel    Collection Time: 04/07/23  6:29 AM   Result Value Ref Range    Sodium 141 135 - 145 mmol/L    Potassium 5.4 (H) 3.4 - 4.8 mmol/L    Chloride 111 (H) 98 - 107 mmol/L    CO2 19.0 (L) 20.0 - 31.0 mmol/L    Anion Gap 11 5 - 14 mmol/L    BUN 41 (H) 9 - 23 mg/dL    Creatinine 1.61 (H) 0.55 - 1.02 mg/dL    BUN/Creatinine Ratio 13     eGFR CKD-EPI (2021) Female 19 (L) >=60 mL/min/1.42m2    Glucose 90 70 - 179 mg/dL    Calcium 8.9 8.7 - 09.6 mg/dL   Amylase Level    Collection Time: 04/07/23  6:29 AM   Result Value Ref Range    Amylase 102 30 - 118 U/L   Lipase Level    Collection Time: 04/07/23  6:29 AM   Result Value Ref Range    Lipase 25 12 - 53 U/L   Reticulocytes    Collection Time: 04/07/23  6:29 AM   Result Value Ref Range    Reticulocyte Auto % 0.59 0.50 - 2.17 %    Absolute Auto Reticulocyte 12.9 (L) 23.0 - 100.0 10*9/L   Morphology Review    Collection Time: 04/07/23  6:29 AM   Result Value Ref Range    Smear Review Comments See Comment (A) Undefined    Ovalocytes Moderate (A) Not Present    Burr Cells Present (A) Not Present    Acanthocytes Present (A) Not Present    Hypersegmented Neutrophils Present (A) Not Present    Toxic Vacuolation Present (A) Not Present    Poikilocytosis Moderate (A) Not Present       US Renal Transplant W Doppler  Result Date: 04/06/2023  EXAM: US RENAL Glenna Durand ACCESSION: [id number] UN REPORT DATE: 04/06/2023 4:18 PM CLINICAL INDICATION: 38 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 Views of the urinary bladder were obtained using gray scale and limited color Doppler imaging. FINDINGS: TRANSPLANTED KIDNEY: The renal transplant was located in the left lower quadrant. Normal size and echogenicity.  No solid masses or calculi. Previously-described fluid collection along the lower pole of the left lower quadrant transplant kidney is decreased in size when compared to prior exam, now measuring up to 2.5 2.4 x 1.1 cm (previously 3.7 x 2.3 x 2.0 cm). VESSELS: - Perfusion: Using power Doppler, normal perfusion was seen throughout the renal parenchyma. - Resistive indices in the renal transplant are stable compared with prior examination, all of which remain within normal limits/upper limits of normal - Main renal artery/iliac artery: Patent - Main renal vein/iliac vein: Patent BLADDER: Unremarkable. OTHER: Decreased size of the hypoechoic fluid collection along the inferior aspect of the transplant kidney measuring 2.5 x 2.4 x 1.1 cm, previously 3.7 x 2.3 x 2.0 cm.     1. Stable resistive indices in the renal transplant arteries, all of which remain within normal limits/upper  limits of normal. 2. Decreased perinephric fluid collection measuring up to 2.5 cm, previously 3.7 cm. Please see below for data measurements: Transplant location: LLQ Renal Transplant: Sagittal 10.0 cm; AP 4.1 cm; Transverse 5.6 cm Segmental artery superior resistive index: 0.76 Segmental artery mid resistive index: 0.78 Segmental artery inferior resistive index: 0.77 Previous resistive indices range of segmental arteries: 0.70-0.77 Main renal artery peak systolic velocity at anastomosis: 0.76 m/s Main renal artery hilum resistive index: 0.73 Main renal artery mid resistive index: 0.81 Main renal artery anastomosis resistive index: 0.84 Previous resistive indices range of main renal artery: 0.70-0.87 Main renal vein: patent Iliac artery: Patent Iliac vein: Patent Bladder volume prevoid: 110.2  mL     ECG 12 Lead  Result Date: 04/06/2023  NORMAL SINUS RHYTHM LOW VOLTAGE QRS PEAKED T WAVES , CONSIDER HYPERKALEMIA WHEN COMPARED WITH ECG OF 25-Dec-2022 03:29, NO SIGNIFICANT CHANGE WAS FOUND Confirmed by Schuyler Amor [phone]) on 04/06/2023 4:22:25 PM    [place] Endovaginal (Non-OB)  Result Date: 04/06/2023  EXAM: [place] ENDOVAGINAL (NON-OB) ACCESSION: [id number] UN REPORT DATE: 04/06/2023 1:11 PM CLINICAL INDICATION: 38 years old with leiomyoma, anemia  LMP: 04/01/23 COMPARISON: Endovaginal ultrasound 02/02/2023 TECHNIQUE: As per sonographer, the endovaginal pelvic procedure was explained to the patient and the patient verbally consented to the exam. Ultrasound views of the pelvis were obtained endovaginally using gray scale and color Doppler imaging. Spectral Doppler imaging was also performed. FINDINGS: UTERUS/CERVIX: The uterus was anteverted and measured 10.8 x 4.9 x 6.2 cm. Anterior intramural fibroid measuring 1.9 x 2.1 x 1.9 cm, unchanged.. Small defect noted along the anterior aspect of the lower uterine segment, likely reflecting sequelae of prior cesarean section. The endometrium was normal in thickness, measuring 0.55 cm. OVARIES: The ovaries were seen well transvaginally. Small cystic areas were seen within both ovaries compatible with follicles. Appropriate arterial inflow and venous outflow of the ovaries was documented on color and spectral Doppler imaging. The right ovary measured 5.0 x 1.9 x 3.6 cm and the left ovary measured 3.3 x 2.3 x 3.5 cm. OTHER: Small amount of free fluid within the dependent portion of the pelvis.     1.  No sonographic evidence to suggest ovarian torsion at this time. 2.  Note made of a small anterior intramural fibroid, measuring up to 1.9 cm in greatest dimension and is essentially unchanged when compared to prior exam. 3.  Small volume free fluid noted within the posterior cul-de-sac, which is nonspecific and may be physiologic in nature.

## 2023-04-08 LAB — SLIDE REVIEW

## 2023-04-08 LAB — CBC W/ AUTO DIFF
BASOPHILS ABSOLUTE COUNT: 0 10*9/L (ref 0.0–0.1)
BASOPHILS RELATIVE PERCENT: 1.4 %
EOSINOPHILS ABSOLUTE COUNT: 0.1 10*9/L (ref 0.0–0.5)
EOSINOPHILS RELATIVE PERCENT: 7.6 %
HEMATOCRIT: 22.6 % — ABNORMAL LOW (ref 34.0–44.0)
HEMOGLOBIN: 8 g/dL — ABNORMAL LOW (ref 11.3–14.9)
LYMPHOCYTES ABSOLUTE COUNT: 0.3 10*9/L — ABNORMAL LOW (ref 1.1–3.6)
LYMPHOCYTES RELATIVE PERCENT: 25.8 %
MEAN CORPUSCULAR HEMOGLOBIN CONC: 35.3 g/dL (ref 32.0–36.0)
MEAN CORPUSCULAR HEMOGLOBIN: 35 pg — ABNORMAL HIGH (ref 25.9–32.4)
MEAN CORPUSCULAR VOLUME: 99.3 fL — ABNORMAL HIGH (ref 77.6–95.7)
MEAN PLATELET VOLUME: 8.4 fL (ref 6.8–10.7)
MONOCYTES ABSOLUTE COUNT: 0.1 10*9/L — ABNORMAL LOW (ref 0.3–0.8)
MONOCYTES RELATIVE PERCENT: 7.1 %
NEUTROPHILS ABSOLUTE COUNT: 0.6 10*9/L — ABNORMAL LOW (ref 1.8–7.8)
NEUTROPHILS RELATIVE PERCENT: 58.1 %
PLATELET COUNT: 86 10*9/L — ABNORMAL LOW (ref 150–450)
RED BLOOD CELL COUNT: 2.28 10*12/L — ABNORMAL LOW (ref 3.95–5.13)
RED CELL DISTRIBUTION WIDTH: 21.2 % — ABNORMAL HIGH (ref 12.2–15.2)
WBC ADJUSTED: 1 10*9/L — ABNORMAL LOW (ref 3.6–11.2)

## 2023-04-08 LAB — ZINC: ZINC: 71 ug/dL

## 2023-04-08 LAB — BASIC METABOLIC PANEL
ANION GAP: 12 mmol/L (ref 5–14)
BLOOD UREA NITROGEN: 41 mg/dL — ABNORMAL HIGH (ref 9–23)
BUN / CREAT RATIO: 13
CALCIUM: 8.9 mg/dL (ref 8.7–10.4)
CHLORIDE: 108 mmol/L — ABNORMAL HIGH (ref 98–107)
CO2: 20 mmol/L (ref 20.0–31.0)
CREATININE: 3.25 mg/dL — ABNORMAL HIGH (ref 0.55–1.02)
EGFR CKD-EPI (2021) FEMALE: 18 mL/min/{1.73_m2} — ABNORMAL LOW (ref >=60)
GLUCOSE RANDOM: 84 mg/dL (ref 70–179)
POTASSIUM: 4.6 mmol/L (ref 3.4–4.8)
SODIUM: 140 mmol/L (ref 135–145)

## 2023-04-08 LAB — HEPATIC FUNCTION PANEL
ALBUMIN: 3.8 g/dL (ref 3.4–5.0)
ALKALINE PHOSPHATASE: 53 U/L (ref 46–116)
ALT (SGPT): 63 U/L — ABNORMAL HIGH (ref 10–49)
AST (SGOT): 33 U/L (ref <=34)
BILIRUBIN DIRECT: 0.7 mg/dL — ABNORMAL HIGH (ref 0.00–0.30)
BILIRUBIN TOTAL: 2.1 mg/dL — ABNORMAL HIGH (ref 0.3–1.2)
PROTEIN TOTAL: 6.6 g/dL (ref 5.7–8.2)

## 2023-04-08 LAB — TACROLIMUS LEVEL, TROUGH: TACROLIMUS, TROUGH: 9.3 ng/mL (ref 5.0–15.0)

## 2023-04-08 LAB — MAGNESIUM: MAGNESIUM: 1.9 mg/dL (ref 1.6–2.6)

## 2023-04-08 LAB — PHOSPHORUS: PHOSPHORUS: 4.8 mg/dL (ref 2.4–5.1)

## 2023-04-08 LAB — COPPER, SERUM: COPPER: 91 ug/dL

## 2023-04-08 LAB — CMV DNA, QUANTITATIVE, PCR: CMV VIRAL LD: NOT DETECTED

## 2023-04-08 MED ADMIN — HYDROmorphone (DILAUDID) tablet 2 mg: 2 mg | ORAL | @ 06:00:00 | Stop: 2023-04-08

## 2023-04-08 MED ADMIN — metroNIDAZOLE (FLAGYL) tablet 500 mg: 500 mg | ORAL | @ 01:00:00 | Stop: 2023-04-14

## 2023-04-08 MED ADMIN — escitalopram oxalate (LEXAPRO) tablet 10 mg: 10 mg | ORAL | @ 14:00:00

## 2023-04-08 MED ADMIN — acetaminophen (TYLENOL) tablet 1,000 mg: 1000 mg | ORAL | @ 10:00:00 | Stop: 2023-04-08

## 2023-04-08 MED ADMIN — sodium bicarbonate tablet 1,300 mg: 1300 mg | ORAL | @ 10:00:00

## 2023-04-08 MED ADMIN — tacrolimus (ENVARSUS XR) extended release tablet 16 mg: 16 mg | ORAL | @ 14:00:00

## 2023-04-08 MED ADMIN — medroxyPROGESTERone (PROVERA) tablet 20 mg: 20 mg | ORAL | @ 23:00:00 | Stop: 2023-04-14

## 2023-04-08 MED ADMIN — medroxyPROGESTERone (PROVERA) tablet 20 mg: 20 mg | ORAL | @ 14:00:00 | Stop: 2023-04-14

## 2023-04-08 MED ADMIN — filgrastim-ayow (RELEUKO) injection syringe 300 mcg: 300 ug | SUBCUTANEOUS | @ 04:00:00 | Stop: 2023-04-07

## 2023-04-08 MED ADMIN — acetaminophen (TYLENOL) tablet 1,000 mg: 1000 mg | ORAL | @ 01:00:00

## 2023-04-08 MED ADMIN — ondansetron (ZOFRAN) injection 4 mg: 4 mg | INTRAVENOUS | @ 01:00:00

## 2023-04-08 MED ADMIN — medroxyPROGESTERone (PROVERA) tablet 20 mg: 20 mg | ORAL | @ 19:00:00 | Stop: 2023-04-14

## 2023-04-08 MED ADMIN — folic acid (FOLVITE) tablet 1 mg: 1 mg | ORAL | @ 14:00:00

## 2023-04-08 MED ADMIN — sodium bicarbonate tablet 1,300 mg: 1300 mg | ORAL | @ 19:00:00

## 2023-04-08 MED ADMIN — sodium zirconium cyclosilicate (LOKELMA) packet 10 g: 10 g | ORAL | @ 01:00:00 | Stop: 2023-04-08

## 2023-04-08 MED ADMIN — butalbital-acetaminophen-caffeine (ESGIC) per tablet 1 tablet: 1 | ORAL | @ 14:00:00 | Stop: 2023-04-08

## 2023-04-08 MED ADMIN — metroNIDAZOLE (FLAGYL) tablet 500 mg: 500 mg | ORAL | @ 14:00:00 | Stop: 2023-04-14

## 2023-04-08 MED ADMIN — tacrolimus (ENVARSUS XR) extended release tablet 2 mg: 2 mg | ORAL | @ 14:00:00

## 2023-04-08 NOTE — Unmapped (Signed)
 Pt A&Ox4, VSS, no complaint of pain but scheduled pain med given, calm and cooperative. Pt given 1L oral fluid hydration. Pt ambulated in room. Teley in place. Bed in low and locked position. Enteric precaution maintained.                   Problem: Adult Inpatient Plan of Care  Goal: Plan of Care Review  Outcome: Progressing  Goal: Patient-Specific Goal (Individualized)  Outcome: Progressing  Goal: Absence of Hospital-Acquired Illness or Injury  Outcome: Progressing  Intervention: Identify and Manage Fall Risk  Recent Flowsheet Documentation  Taken 04/08/2023 0800 by [place], [place] A, RN  Safety Interventions:   fall reduction program maintained   low bed   nonskid shoes/slippers when out of bed   isolation precautions  Intervention: Prevent and Manage VTE (Venous Thromboembolism) Risk  Recent Flowsheet Documentation  Taken 04/08/2023 8119 by Maryagnes Amos, [place], RN  VTE Prevention/Management:   ambulation promoted   anticoagulant therapy  Goal: Optimal Comfort and Wellbeing  Outcome: Progressing  Goal: Readiness for Transition of Care  Outcome: Progressing  Goal: Rounds/Family Conference  Outcome: Progressing     Problem: Infection  Goal: Absence of Infection Signs and Symptoms  Outcome: Progressing  Intervention: Prevent or Manage Infection  Recent Flowsheet Documentation  Taken 04/08/2023 0800 by [place], [place] A, RN  Isolation Precautions: enteric precautions maintained     Problem: Wound  Goal: Optimal Coping  Outcome: Progressing  Goal: Optimal Functional Ability  Outcome: Progressing  Goal: Absence of Infection Signs and Symptoms  Outcome: Progressing  Intervention: Prevent or Manage Infection  Recent Flowsheet Documentation  Taken 04/08/2023 0800 by [place], [place] A, RN  Isolation Precautions: enteric precautions maintained  Goal: Improved Oral Intake  Outcome: Progressing  Goal: Optimal Pain Control and Function  Outcome: Progressing  Goal: Skin Health and Integrity  Outcome: Progressing  Intervention: Optimize Skin Protection  Recent Flowsheet Documentation  Taken 04/08/2023 0800 by [place], [place] A, RN  Head of Bed [facility]) Positioning: HOB at 20-30 degrees  Goal: Optimal Wound Healing  Outcome: Progressing     Problem: Fluid Volume Deficit  Goal: Fluid Balance  Outcome: Progressing     Problem: Pain Acute  Goal: Optimal Pain Control and Function  Outcome: Progressing

## 2023-04-08 NOTE — Unmapped (Signed)
 Nephrology ([facility]) Progress Note    Assessment & Plan:   Elaine Koch is a 38 y.o. 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)    History of diabetes mellitus, type I    Sickle cell trait (CMS-HCC)    History of simultaneous kidney and pancreas transplant (CMS-HCC)    History of migraine    Anemia    Acute kidney injury superimposed on chronic kidney disease (CMS-HCC)    Recurrent Clostridioides difficile diarrhea    Immunosuppressed status (CMS-HCC)    Pancytopenia (CMS-HCC)    Elevated liver enzymes    Depression    Hyperkalemia    Metabolic acidosis      Active Problems    #Abnormal Vaginal Bleeding, stable  #Macrocytic Anemia 2/2 azathioprine  #Recurrent BV  Hgb 4.8 on admission (baseline 8-9), now s/p 3u pRBCs with appropriate response to 7.8, stable. Intermittent spotting with clots associated with lightheadedness and dizziness for 6 days in addition to 2 weeks of diarrhea. Chronic macrocytic anemia since 2022; iron panel Nov 2024 normal; B12 normal, folate low. Hx 2 prior D&Cs in 2023 and 2024. LMP 03/10/23. History intramural fibroids and hemorrhagic ovarian cyst (seen on ultrasound Dec 2024). Takes intravaginal medication, MetroGel, for recurrent BV three times per week. At [facility] ED, cervical os closed and TVUS unremarkable.  HCG negative. 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 Differential of acute blood loss includes fibroid bleed, CMV endometritis, menorrhagia, vaginal lac; G/C cervical swab negative. Possibly with underlying coagulopathy, such as acquired vWD, uremic platelet dysfunction, liver dysfunction (AST/ALT elevated), though this is less likely given no history of bleeding before this episode.   - s/p 3u pRBCs with appropriate response  - GYN consulted, appreciate recs  - Continue Provera taper per Gynecology   + Week 1: Provera 20 mg TID x7 days  + Week 2: Provera 20 mg BID x7 days  + Week 3: Provera 20 mg daily until outpatient clinic follow-up  - Transfuse for Hgb<8 (higher target due to AKI on renal transplant)  - Maintain active type and screen  - If significant continued bleeding, tranexemic acid 1.3g TID (up to 5 days)  - Holding Flagyl vaginal gel for now (ordered three times a week); Start PO Flagyl  - Holding Azathioprine due to pancytopenia, as below  - Follow up Cu, Zn; Fibrinogen normal     #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 Carlene Coria. Cr 3.3 (baseline 1.8-2.1). K+ 5.4, bicarb 16.3. UA without evidence of infection. Suspect ATN 2/2 hypoperfusion injury iso anemia and GI losses vs rejection. Hyperkalemic acidosis likely 2/2 tubular injury; diarrhea (below) contributing to metabolic acidosis and hypovolemia. HDS, clinically somewhat hypovolemic. Poor PO in due to low appetite, but no N/V. Patient with likely ATN.  - Oral rehydration protocol  - S/p 1L LR 2/24  - No indications for emergent HD  - Spin urine  - Continue home bicarb 1300mg  TID  - K+ normalized s/p Lokelma  - Immunosuppression as below     # Diarrhea, Non-Bloody - Hx Recurrent C.diff, improving  2 weeks of non-bloody diarrhea a/w severe bloating.  No recent antibiotic usage, abdominal pain, fevers.  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 Outpatient provider January 2025 recommended SIBO testing if persistent.  Also considered IBS. Will perform basic infectious workup and provide supportive care at this time. No diarrhea since admission, making infectious enteritis less likely. CTM.  - GIPP/C.diff/CMV PCR   - Stool sample still not collected 2/25  - Fluid management as above     #Immunosuppression - Pancytopenia - Neutropenia  WBC 0.7, ANC 0.3 on 2/24, improved s/p GCSF. Pancytopenia from azathioprine, currently held. Will discuss with her transplant nephrologist and consider switch to MMF.  - S/p Filgrastrim 300 mcg x1 on 2/24  - Cont home Tacrolimus 18mg  daily: goal 8-10 given her pancreas transplant; repeat trough measurement per pharmacist recommendation  - Holding Azathioprine 100 mg daily  - Not on MMF due to desire for pregnancy, but patient does not desire pregnancy at this time; ongoing discussion, but may be open to switch from AZA to MMF.     #Pain management - Headache - History of Migraines:  Chronic leg pain since 2022 and history of migraines. Previously on triptan; opioid naive.   - Tylenol 650mg  PO q6h PRN  - Esgic (butalbital-acetaminophen-caffeine) 1 tab q4h PRN HA  - Dilaudid 2mg  q6 prn severe pain not relieved by Tylenol or Esgic  - Methocarbamol 500mg  q8 prn muscle pain  - Avoiding triptan due to vasoconstrictive effect    Chronic/Stable Problems  #Elevated AST/ALT, resolving  75/107 in ER, from normal baseline in Dec 2024. Likely shock liver from hypovolemia and anemia. Will continue to trend, obtain RUQ [place] if persistent despite resuscitation. Trending down with resuscitation.    #Type 1 DM s/p pancreas transplant 2021: Amylase, lipase, C-peptide wnl at outpatient visit; lipase on admission wnl. Immunosuppression as above.     #Depression/anxiety: Stable on Lexapro, she has been working with Dr. Neale Burly with plans to establish with mental health locally      # Hx of Hemorrhoids: Patient has history of external large non-thrombosed hemorrhoids. Used OTC gels at home. At recent OP IM visit, denied issues with constipation.     #Sickle Cell trait: Noncontributory     #Continuous murmur at RUSB and LUSB: Noted on prior admissions. Possibly a referred murmur from RUE AV fistula, with flow murmur in setting of severe anemia. Last TTE in 2021 was unremarkable.     Daily Checklist:  Diet: Regular Diet  DVT PPx: Contraindicated - High Risk for Bleeding/Active Bleeding  Electrolytes: No Repletion Needed  Code Status: Full Code  Dispo:  Floor    Team Contact Information:   Primary Team: Nephrology (MEDB)  Primary Resident: Jen Mow, MD  Resident's Pager: [phone] (Nephrology Intern - Tower)    Interval History:   NAEON. Bleeding continues to decrease - not much on pads, but is still peeing out clots, though they are smaller than yesterday.    Had headache and leg pain for which she received a total of 4mg  Dilaudid. Slept well after Dilaudid. Reports history of migraines worse with HD, was previously on a triptan; at home, manages headache with Tylenol and it usually resolves after sleeping. Also has Esgic (butalbital-acetaminophen-caffeine), but does not use it. States current headaches are similar to those in the past, but are not resolved by Tylenol. Leg pain is over her entire legs, described as cramping and restlessness, worse after GCSF treatment yesterday. States  she mainly gets leg pain when she is very sick.    Yesterday, only ate a bowl of oatmeal and a few bites of a sandwich; food tasted good, but was not hungry. Reports baseline intermittent decreased appetite since transplant, but is currently worse than usual.     BM x1 in past 24h. States her diarrhea always disappears when she comes to the hospital.      Objective:   Temp:  [36.6 ??C (97.9 ??F)-36.8 ??C (98.2 ??F)] 36.6 ??C (97.9 ??F)  Heart Rate:  [73-76] 76  Resp:  [16-19] 16  BP: (133-140)/(79-80) 133/79  SpO2:  [100 %] 100 %,   Intake/Output Summary (Last 24 hours) at 04/08/2023 1354  Last data filed at 04/08/2023 1100  Gross per 24 hour   Intake 620 ml   Output --   Net 620 ml   ,   Wt Readings from Last 3 Encounters:   04/06/23 62.5 kg (137 lb 10.8 oz)   03/14/23 66.9 kg (147 lb 6.4 oz)   03/07/23 67.3 kg (148 lb 6.4 oz)       Gen: NAD, converses pleasantly  HENT: atraumatic, normocephalic  Heart: RRR  Lungs: CTAB, no crackles or wheezes  Abdomen: soft, NTND  Extremities: No edema    Alfredo Batty, MS4    I attest that I have reviewed the student note and that the components of the history of the present illness, the physical exam, and the assessment and plan documented were performed by me or were performed in my presence by the student where I verified the documentation and performed (or re-performed) the exam and medical decision making.    This patient was seen and discussed in detail with physician who attested above who agrees with the above assessment and plan for this patient.    Jen Mow, MD  [facility] Neurology, PGY-1

## 2023-04-08 NOTE — Unmapped (Signed)
 Pt is alert and oriented x4.Report of head ache and leg pain.Received 2 mg dilaudid po given as prn.pt sleeping on and off.Had vomited once.prn iv zofran given with adequate relief. Pt had PVL done at the bedside.pt denies any loose stools.pt feeling weak and remain in bed.falls precautions in place. Will continue to monitor the pt.    Problem: Adult Inpatient Plan of Care  Goal: Plan of Care Review  Outcome: Progressing  Goal: Patient-Specific Goal (Individualized)  Outcome: Progressing  Goal: Absence of Hospital-Acquired Illness or Injury  Outcome: Progressing  Goal: Optimal Comfort and Wellbeing  Outcome: Progressing  Goal: Readiness for Transition of Care  Outcome: Progressing  Goal: Rounds/Family Conference  Outcome: Progressing     Problem: Infection  Goal: Absence of Infection Signs and Symptoms  Outcome: Progressing     Problem: Wound  Goal: Optimal Coping  Outcome: Progressing  Goal: Optimal Functional Ability  Outcome: Progressing  Goal: Absence of Infection Signs and Symptoms  Outcome: Progressing  Goal: Improved Oral Intake  Outcome: Progressing  Goal: Optimal Pain Control and Function  Outcome: Progressing  Goal: Skin Health and Integrity  Outcome: Progressing  Goal: Optimal Wound Healing  Outcome: Progressing     Problem: Fluid Volume Deficit  Goal: Fluid Balance  Outcome: Progressing     Problem: Pain Acute  Goal: Optimal Pain Control and Function  Outcome: Progressing

## 2023-04-09 LAB — TACROLIMUS LEVEL, TROUGH: TACROLIMUS, TROUGH: 5.1 ng/mL (ref 5.0–15.0)

## 2023-04-09 LAB — BASIC METABOLIC PANEL
ANION GAP: 13 mmol/L (ref 5–14)
BLOOD UREA NITROGEN: 44 mg/dL — ABNORMAL HIGH (ref 9–23)
BUN / CREAT RATIO: 13
CALCIUM: 8.9 mg/dL (ref 8.7–10.4)
CHLORIDE: 105 mmol/L (ref 98–107)
CO2: 22 mmol/L (ref 20.0–31.0)
CREATININE: 3.43 mg/dL — ABNORMAL HIGH (ref 0.55–1.02)
EGFR CKD-EPI (2021) FEMALE: 17 mL/min/{1.73_m2} — ABNORMAL LOW (ref >=60)
GLUCOSE RANDOM: 90 mg/dL (ref 70–179)
POTASSIUM: 4.9 mmol/L — ABNORMAL HIGH (ref 3.4–4.8)
SODIUM: 140 mmol/L (ref 135–145)

## 2023-04-09 LAB — TSH: THYROID STIMULATING HORMONE: 1.508 u[IU]/mL (ref 0.550–4.780)

## 2023-04-09 LAB — CBC W/ AUTO DIFF
BASOPHILS ABSOLUTE COUNT: 0 10*9/L (ref 0.0–0.1)
BASOPHILS RELATIVE PERCENT: 1.2 %
EOSINOPHILS ABSOLUTE COUNT: 0.1 10*9/L (ref 0.0–0.5)
EOSINOPHILS RELATIVE PERCENT: 7.4 %
HEMATOCRIT: 22.6 % — ABNORMAL LOW (ref 34.0–44.0)
HEMOGLOBIN: 8 g/dL — ABNORMAL LOW (ref 11.3–14.9)
LYMPHOCYTES ABSOLUTE COUNT: 0.3 10*9/L — ABNORMAL LOW (ref 1.1–3.6)
LYMPHOCYTES RELATIVE PERCENT: 29.5 %
MEAN CORPUSCULAR HEMOGLOBIN CONC: 35.5 g/dL (ref 32.0–36.0)
MEAN CORPUSCULAR HEMOGLOBIN: 34.9 pg — ABNORMAL HIGH (ref 25.9–32.4)
MEAN CORPUSCULAR VOLUME: 98.4 fL — ABNORMAL HIGH (ref 77.6–95.7)
MEAN PLATELET VOLUME: 8.1 fL (ref 6.8–10.7)
MONOCYTES ABSOLUTE COUNT: 0.1 10*9/L — ABNORMAL LOW (ref 0.3–0.8)
MONOCYTES RELATIVE PERCENT: 7.5 %
NEUTROPHILS ABSOLUTE COUNT: 0.6 10*9/L — ABNORMAL LOW (ref 1.8–7.8)
NEUTROPHILS RELATIVE PERCENT: 54.4 %
PLATELET COUNT: 78 10*9/L — ABNORMAL LOW (ref 150–450)
RED BLOOD CELL COUNT: 2.29 10*12/L — ABNORMAL LOW (ref 3.95–5.13)
RED CELL DISTRIBUTION WIDTH: 20.5 % — ABNORMAL HIGH (ref 12.2–15.2)
WBC ADJUSTED: 1 10*9/L — ABNORMAL LOW (ref 3.6–11.2)

## 2023-04-09 LAB — HEPATIC FUNCTION PANEL
ALBUMIN: 3.4 g/dL (ref 3.4–5.0)
ALKALINE PHOSPHATASE: 56 U/L (ref 46–116)
ALT (SGPT): 57 U/L — ABNORMAL HIGH (ref 10–49)
AST (SGOT): 34 U/L (ref <=34)
BILIRUBIN DIRECT: 0.8 mg/dL — ABNORMAL HIGH (ref 0.00–0.30)
BILIRUBIN TOTAL: 1.8 mg/dL — ABNORMAL HIGH (ref 0.3–1.2)
PROTEIN TOTAL: 6.3 g/dL (ref 5.7–8.2)

## 2023-04-09 LAB — PHOSPHORUS: PHOSPHORUS: 4.2 mg/dL (ref 2.4–5.1)

## 2023-04-09 LAB — SLIDE REVIEW

## 2023-04-09 LAB — MAGNESIUM: MAGNESIUM: 1.9 mg/dL (ref 1.6–2.6)

## 2023-04-09 MED ADMIN — medroxyPROGESTERone (PROVERA) tablet 20 mg: 20 mg | ORAL | @ 23:00:00 | Stop: 2023-04-14

## 2023-04-09 MED ADMIN — sodium bicarbonate tablet 1,300 mg: 1300 mg | ORAL | @ 10:00:00

## 2023-04-09 MED ADMIN — medroxyPROGESTERone (PROVERA) tablet 20 mg: 20 mg | ORAL | @ 14:00:00 | Stop: 2023-04-14

## 2023-04-09 MED ADMIN — metroNIDAZOLE (FLAGYL) tablet 500 mg: 500 mg | ORAL | @ 14:00:00 | Stop: 2023-04-14

## 2023-04-09 MED ADMIN — tacrolimus (ENVARSUS XR) extended release tablet 16 mg: 16 mg | ORAL | @ 14:00:00 | Stop: 2023-04-09

## 2023-04-09 MED ADMIN — acetaminophen (TYLENOL) tablet 650 mg: 650 mg | ORAL | @ 01:00:00

## 2023-04-09 MED ADMIN — medroxyPROGESTERone (PROVERA) tablet 20 mg: 20 mg | ORAL | @ 20:00:00 | Stop: 2023-04-14

## 2023-04-09 MED ADMIN — sodium bicarbonate tablet 1,300 mg: 1300 mg | ORAL | @ 20:00:00

## 2023-04-09 MED ADMIN — tacrolimus (ENVARSUS XR) extended release tablet 2 mg: 2 mg | ORAL | @ 14:00:00 | Stop: 2023-04-09

## 2023-04-09 MED ADMIN — melatonin tablet 3 mg: 3 mg | ORAL | @ 01:00:00

## 2023-04-09 MED ADMIN — mirtazapine (REMERON) tablet 15 mg: 15 mg | ORAL | @ 01:00:00

## 2023-04-09 MED ADMIN — folic acid (FOLVITE) tablet 1 mg: 1 mg | ORAL | @ 14:00:00

## 2023-04-09 MED ADMIN — escitalopram oxalate (LEXAPRO) tablet 10 mg: 10 mg | ORAL | @ 14:00:00

## 2023-04-09 MED ADMIN — metroNIDAZOLE (FLAGYL) tablet 500 mg: 500 mg | ORAL | @ 01:00:00 | Stop: 2023-04-14

## 2023-04-09 NOTE — Unmapped (Signed)
 Confidential Psychological Therapy Session  [facility]     Patient Name: Elaine Koch  Medical Record Number: [id number]  Date of Service: April 09, 2023  Clinical Psychologist: Artemio Aly, PhD  Intern: None  Time Spent: 20 min of face-to-face counseling  CPT Procedure Code: 81191 (30 min psychotherapy with patient and/or family)  Therapy Type: Behavior Modifying/Cognitive Behavioral Therapy (CBT)  Purpose of Treatment: assess current MH needs, reduce depression symptoms, reduce anxiety symptoms, improve quality of life, improve coping, adjustment to chronic medical illness, safety planning     The limits of confidentiality and the purpose of the evaluation were reviewed. The patient was provided with a verbal description of the nature and purpose of the psychological evaluation. I also reviewed the referral source, specific referral question for this evaluation, foreseeable risks/discomforts, benefits, limits of confidentiality, and mandatory reporting requirements of this provider. The patient was given the opportunity to ask questions and receive answers about the present evaluation. Oral consent was provided by the patient.      This evaluation note may contain sensitive and confidential information regarding the patient???s psychosocial adjustment to living with a chronic medical condition. DO NOT share this information outside [facility] without written consent from the patient explicitly stating that mental health records may be released.      Referral/Relevant History:  Ms.  Elaine Koch is a very pleasant 38 y.o. female who presents for cognitive behavioral therapy to clarify current MH needs and address symptoms of depression/anxiety that were recently reported during post-transplant nephrology follow-up with Dr. Lucienne Minks True on 12/13/22, primarily in context of recent pregnancy loss. [name] has previously been referred for similar concerns in April 2024 and had been working with Clinical research associate until July 2024 (missed an appt in August after starting new job).  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      [name] met with writer initially on 07/12/22, when she endorsed symptoms consistent with PTSD, MDD Recurrent, and panic attacks, as well as frequent passive SI. She was engaged with a PHP through [facility] which had been beneficial, but as this was only a 21-day program, Clinical research associate agreed to continue to follow her for support until she is able to connect with a consistent, long-term provider; this nearly happened at [facility] but she was unable to attend that appt and never established outpatient MH care. She was seen today while inpatient at bedside alone (currently hospitalized for vaginal bleeding).     Review of Symptoms/ROS: Deferred     Subjective:   [name] reported that she feels like she had been doing well until recent hospitalization for vaginal bleeding, which required her to receive two units of blood. She was hopeful to go home today but is being kept another day because her creatinine is trending up; this was discouraging to hear as she feels she is falling behind at work and missing her son, but she understands the reason why she is being asked to stay. She feels fine physically now.    [name] did acknowledge feeling some heightened anxiety while inpatient. She worries that this week was busy at work, though was able to recognize that most weeks are busy and it would never be a convenient time for something like a hospitalization to occur. 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     [name] was able to engage with Clinical research associate and  practice cognitive restructuring skills. She was able to recognize that this is not within her control, and described evidence to challenge anxious thoughts (e.g. 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 She appreciated today's visit and plans to attend outpatient follow-up already scheduled next week.      Objective:   Mental Status Exam:  Appearance: Appears stated age and Clean/Neat.  Motor: No abnormal movements  Speech/Language: Normal rate, volume, tone, fluency  Mood: Doing well, increased anxiety during inpatient stay  Affect: Somewhat flat, but mostly Mood congruent  Thought Process: Logical, linear, clear, coherent, goal directed  Thought Content: H/o Suicidal Ideation, passive, denied plan/intent; denies current SI/HI or A/VH  Perceptual Disturbances: Denies auditory and visual hallucinations, behavior not concerning for response to internal stimuli  Orientation: Oriented to person, place, time, and general circumstances  Attention: Able to fully attend without fluctuations in consciousness  Concentration: Able to fully concentrate and attend  Memory: Immediate, short-term, long-term, and recall grossly intact  Fund of Knowledge: Consistent with level of education and development  Insight: Fair  Judgment: Fair  Impulse Control: Fair        Assessment:  Ms.  Elaine Koch participated well in this CBT session and exhibits good motivation towards treatment goals. Today she reported ongoing improvements in symptoms of depression and anxiety, though a slight increase in anxiety after unexpected hospitalization for vaginal bleeding; symptoms likely remain in the moderate range (PHQ-9 score at PCP office a few weeks ago was 17, moderately severe symptoms of depression). She continues to use some effective coping strategies, in particular using behavioral activation to reduce avoidance and spend less time in bed, which is likely a key contributor to improved mood reported today.      Taken altogether, [name] appears to be experiencing moderate symptoms of depression and anxiety at this time, though with good engagement in using CBT-oriented skills, she seems to be utilizing more effective coping strategies lately and has reported some improvements in mood compared to earlier this year. She would benefit from consistent, active engagement in [facility] care to treat these symptoms, but work schedule and finances are barriers to this. She was amenable to monthly check-ins with Clinical research associate for now, and we will continue to explore other treatment options. She was again encouraged to follow up with the [facility] clinic and she was given information for how to contact them to schedule an appointment.      Focus on current treatment is assessing current MH needs, safety planning, treatment planning.      Focus on future sessions will include ongoing assessment and safety planning, introducing and practicing CBT-oriented coping strategies.     Adherence concerns: None reported today, though previous nephrology notes suggest some issues with obtaining labs consistently and disjointed care (more ED visits than outpatient nephrology follow-up).     Diagnostic Impression: Posttraumatic Stress Disorder; Major Depressive Disorder, Severe, Recurrent; Unspecified Anxiety Disorder (panic attacks); R/O Bipolar Disorder        Risk Assessment:  A suicide and violence risk assessment was performed as part of this evaluation. The patient is deemed to be at chronic elevated risk for self-harm/suicide given the following factors: recent bereavement, recent trauma, current diagnosis of depression, hopelessness, previous acts of self-harm, suicidal ideation or threats without a plan, chronic severe medical condition, chronic mental illness > 5 years, and past diagnosis of depression. The patient is deemed to be at chronic elevated risk for violence given the  following factors: recent loss. These risk factors are mitigated by the following factors: no known access to weapons or firearms, no history of violence, motivation for treatment, supportive family, sense of responsibility to family and social supports, minor children living at home, presence of an available support system, employment or functioning in a structured work/academic setting, religious or spiritual prohibition to suicide/violence, and safe housing. There is no acute risk for suicide or violence at this time. The patient was educated about relevant modifiable risk factors including following recommendations for treatment of psychiatric illness and abstaining from substance abuse.               While future psychiatric events cannot be accurately predicted, the patient does not currently require  acute inpatient psychiatric care and does not currently meet [facility] involuntary commitment criteria.               Psychometric Testing: None administered today. PHQ-9 score at PCP visit on 03/14/23 was 17 (moderately severe symptoms of depression)         Plan:  Writer will attempt to meet with Ms. Boan as a bridge until she can establish consistent MH care, though work and finances have been a barrier to this and she was lost to follow-up with this Clinical research associate previously. Specific skills she was encouraged to practice include engagement in values-based behavior (e.g. spending more time with family, attending church) and journaling to practice challenging negative automatic thoughts. She was encouraged to reach out to [facility] as a potential next step for establishing care.     Ms.  Mccall will return Wednesday 04/16/23 at 12:00pm via video.       Ms.  Cassel was given this writer's contact information with confidential voice mail number and instructed to call 911 for emergencies.

## 2023-04-09 NOTE — Unmapped (Signed)
 Pt report of having head ache.tylenol given with adequate relief.denies any bowel movement during night.able to eat better today.Report of scant vaginal bleeding.vital signs stable.Ambulatory in the room.denies any dizziness.Enteric isolation maintained. Plan of care updated.will continue to monitor the pt.     Problem: Adult Inpatient Plan of Care  Goal: Plan of Care Review  Outcome: Progressing  Goal: Patient-Specific Goal (Individualized)  Outcome: Progressing  Goal: Absence of Hospital-Acquired Illness or Injury  Outcome: Progressing  Goal: Optimal Comfort and Wellbeing  Outcome: Progressing  Goal: Readiness for Transition of Care  Outcome: Progressing  Goal: Rounds/Family Conference  Outcome: Progressing     Problem: Infection  Goal: Absence of Infection Signs and Symptoms  Outcome: Progressing     Problem: Wound  Goal: Optimal Coping  Outcome: Progressing  Goal: Optimal Functional Ability  Outcome: Progressing  Goal: Absence of Infection Signs and Symptoms  Outcome: Progressing  Goal: Improved Oral Intake  Outcome: Progressing  Goal: Optimal Pain Control and Function  Outcome: Progressing  Goal: Skin Health and Integrity  Outcome: Progressing  Goal: Optimal Wound Healing  Outcome: Progressing     Problem: Fluid Volume Deficit  Goal: Fluid Balance  Outcome: Progressing     Problem: Pain Acute  Goal: Optimal Pain Control and Function  Outcome: Progressing

## 2023-04-09 NOTE — Unmapped (Signed)
 Tacrolimus Therapeutic Monitoring Pharmacy Note    Elaine Koch is a 38 y.o. female continuing tacrolimus.     Indication: Pancreas transplant     Date of Transplant:  05/03/19       Prior Dosing Information: Home regimen Envarsus XR 18 mg daily      Source(s) of information used to determine prior to admission dosing: Home Medication List, Clinic Note, or Fill HIstory    Goals:  Therapeutic Drug Levels  Tacrolimus trough goal: 8-10 ng/mL    Additional Clinical Monitoring/Outcomes  Monitor renal function (SCr and urine output) and liver function (LFTs)  Monitor for signs/symptoms of adverse events (e.g., hyperglycemia, hyperkalemia, hypomagnesemia, hypertension, headache, tremor)    Results:   Tacrolimus level:  5.1 ng/mL, drawn appropriately    Pharmacokinetic Considerations and Significant Drug Interactions:  Concurrent CYP3A4 substrates/inhibitors:  norgestimate-ethinyl estradiol    Assessment/Plan:  Recommendedation(s)  Increase to envarsus 19 mg    Follow-up  Daily levels ordered .   A pharmacist will continue to monitor and recommend levels as appropriate    Please page service pharmacist with questions/clarifications.    [place] M Randale Carvalho, PharmD

## 2023-04-09 NOTE — Unmapped (Signed)
 Pt A&Ox4, VSS, no complaint of pain, calm and cooperative. Pt given 1L oral fluid hydration. Pt ambulated in room and showered. Pt had BM and stool sample collected. Complaints of hemorrhoids and scant amount of blood post BM, MD notified. Teley in place. Bed in low and locked position. Enteric precaution maintained.          Problem: Fluid Volume Deficit  Goal: Fluid Balance  Outcome: Ongoing - Unchanged     Problem: Adult Inpatient Plan of Care  Goal: Plan of Care Review  Outcome: Progressing  Goal: Patient-Specific Goal (Individualized)  Outcome: Progressing  Goal: Absence of Hospital-Acquired Illness or Injury  Outcome: Progressing  Intervention: Identify and Manage Fall Risk  Recent Flowsheet Documentation  Taken 04/09/2023 0850 by Freda Jackson, RN  Safety Interventions:   fall reduction program maintained   low bed   nonskid shoes/slippers when out of bed   isolation precautions  Intervention: Prevent and Manage VTE (Venous Thromboembolism) Risk  Recent Flowsheet Documentation  Taken 04/09/2023 0850 by Maryagnes Amos, [place], RN  VTE Prevention/Management:   ambulation promoted   anticoagulant therapy  Goal: Optimal Comfort and Wellbeing  Outcome: Progressing  Goal: Readiness for Transition of Care  Outcome: Progressing  Goal: Rounds/Family Conference  Outcome: Progressing     Problem: Infection  Goal: Absence of Infection Signs and Symptoms  Outcome: Progressing  Intervention: Prevent or Manage Infection  Recent Flowsheet Documentation  Taken 04/09/2023 0850 by [place], [place] A, RN  Isolation Precautions: enteric precautions maintained     Problem: Wound  Goal: Optimal Coping  Outcome: Progressing  Goal: Optimal Functional Ability  Outcome: Progressing  Goal: Absence of Infection Signs and Symptoms  Outcome: Progressing  Intervention: Prevent or Manage Infection  Recent Flowsheet Documentation  Taken 04/09/2023 0850 by [place], [place] A, RN  Isolation Precautions: enteric precautions maintained  Goal: Improved Oral Intake  Outcome: Progressing  Goal: Optimal Pain Control and Function  Outcome: Progressing  Goal: Skin Health and Integrity  Outcome: Progressing  Intervention: Optimize Skin Protection  Recent Flowsheet Documentation  Taken 04/09/2023 0850 by [place], [place] A, RN  Head of Bed [facility]) Positioning: HOB at 20-30 degrees  Goal: Optimal Wound Healing  Outcome: Progressing     Problem: Pain Acute  Goal: Optimal Pain Control and Function  Outcome: Progressing
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
## 2023-04-09 NOTE — Unmapped (Signed)
 Nephrology ([facility]) Progress Note    Assessment & Plan:   Elaine Koch is a 38 y.o. 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 with vaginal bleeding and diarrhea.    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)    History of diabetes mellitus, type I    Sickle cell trait (CMS-HCC)    History of simultaneous kidney and pancreas transplant (CMS-HCC)    History of migraine    Anemia    Acute kidney injury superimposed on chronic kidney disease (CMS-HCC)    Recurrent Clostridioides difficile diarrhea    Immunosuppressed status (CMS-HCC)    Pancytopenia (CMS-HCC)    Elevated liver enzymes    Depression    Hyperkalemia    Metabolic acidosis      Active Problems    #Abnormal Vaginal Bleeding, stable  #Macrocytic Anemia 2/2 azathioprine  #Recurrent BV  Hgb 4.8 on admission (baseline 8-9), now s/p 3u pRBCs with appropriate response to 7.8, stable. Intermittent spotting with clots associated with lightheadedness and dizziness for 6 days in addition to 2 weeks of diarrhea. Chronic macrocytic anemia since 2022; iron panel Nov 2024 normal; B12 normal, folate low. Hx 2 prior D&Cs in 2023 and 2024. LMP 03/10/23. History intramural fibroids and hemorrhagic ovarian cyst (seen on ultrasound Dec 2024). Takes intravaginal medication, MetroGel, for recurrent BV three times per week. At [facility] ED, cervical os closed and TVUS unremarkable.  HCG negative. 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 Differential of acute blood loss includes fibroid bleed, CMV endometritis, menorrhagia, vaginal lac; G/C cervical swab negative. Possibly with underlying coagulopathy, such as acquired vWD, uremic platelet dysfunction, liver dysfunction (AST/ALT elevated), though this is less likely given no history of bleeding before this episode.  Patient's bleeding appears to have improved.  Has only been noticing blood in the toilet, but has not been changing pads as frequently.  -Follow-up blood smear  -Follow-up daily labs including CBC with differential  - GYN consulted, appreciate recs  - Continue Provera taper per Gynecology   + Week 1: Provera 20 mg TID x7 days  + Week 2: Provera 20 mg BID x7 days  + Week 3: Provera 20 mg daily until outpatient clinic follow-up  - Transfuse for Hgb<8 (higher target due to AKI on renal transplant)  - Maintain active type and screen  - If significant continued bleeding, tranexemic acid 1.3g TID (up to 5 days)  - Holding Flagyl vaginal gel for now (ordered three times a week); Start PO Flagyl  - Holding Azathioprine due to pancytopenia, as below  - Follow up Cu, Zn; Fibrinogen normal     #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 Carlene Coria. Cr 3.3 (baseline 1.8-2.1). K+ 5.4, bicarb 16.3. UA without evidence of infection. Suspect ATN 2/2 hypoperfusion injury iso anemia and GI losses vs rejection. Hyperkalemic acidosis likely 2/2 tubular injury; diarrhea (below) contributing to metabolic acidosis and hypovolemia. HDS, clinically somewhat hypovolemic. Poor PO in due to low appetite, but no N/V. Patient with likely ATN.  Patient's creatinine is increased since yesterday.  Attempted oral rehydration protocol yesterday without improvement in labs, although slight (3.12-->3.25-->3.43). Low threshold to begin IV  fluid therapy.  Concern for dilutional effect given patient's anemia.  Will attempt oral rehydration again today, with plans to attempt IV fluid therapy if there is no improvement.   - S/p 1L LR 2/24  - No indications for emergent HD  - Spin urine  - Continue home bicarb 1300mg  TID  - K+ normalized s/p Lokelma  - Immunosuppression as below     # Diarrhea, Non-Bloody - Hx Recurrent C.diff, improving  2 weeks of non-bloody diarrhea a/w severe bloating.  No recent antibiotic usage, abdominal pain, fevers.  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 Outpatient provider January 2025 recommended SIBO testing if persistent.  Also considered IBS. Will perform basic infectious workup and provide supportive care at this time. No diarrhea since admission, making infectious enteritis less likely. CTM.  - GIPP/C.diff/CMV PCR   - Stool sample still not collected 2/25  - Fluid management as above     #Immunosuppression - Pancytopenia - Neutropenia  WBC 0.7, ANC 0.3 on 2/24, improved s/p GCSF. Pancytopenia from azathioprine, currently held. Will discuss with her transplant nephrologist and consider switch to MMF.  - S/p Filgrastrim 300 mcg x1 on 2/24  - Cont home Tacrolimus 18mg  daily: goal 8-10 given her pancreas transplant; repeat trough measurement per pharmacist recommendation  - Holding Azathioprine 100 mg daily  - Not on MMF due to desire for pregnancy, but patient does not desire pregnancy at this time; ongoing discussion, but may be open to switch from AZA to MMF.     #Pain management - Headache - History of Migraines:  Chronic leg pain since 2022 and history of migraines. Previously on triptan; opioid naive.   - Tylenol 650mg  PO q6h PRN  - Esgic (butalbital-acetaminophen-caffeine) 1 tab q4h PRN HA  - Dilaudid 2mg  q6 prn severe pain not relieved by Tylenol or Esgic  - Methocarbamol 500mg  q8 prn muscle pain  - Avoiding triptan due to vasoconstrictive effect    Chronic/Stable Problems  #Elevated AST/ALT, resolving  75/107 in ER, from normal baseline in Dec 2024. Likely shock liver from hypovolemia and anemia. Will continue to trend, obtain RUQ [place] if persistent despite resuscitation. Trending down with resuscitation.    #Type 1 DM s/p pancreas transplant 2021: Amylase, lipase, C-peptide wnl at outpatient visit; lipase on admission wnl. Immunosuppression as above.     #Depression/anxiety: Stable on Lexapro, she has been working with Dr. Neale Burly with plans to establish with mental health locally.  -Psychiatry consulted per transplant medicine   -Recommend continuing Lexapro   -Recommend continuing mirtazapine 15 mg nightly   -Recommend obtaining TSH     # Hx of Hemorrhoids: Patient has history of external large non-thrombosed hemorrhoids. Used OTC gels at home. At recent OP IM visit, denied issues with constipation.     #Sickle Cell trait: Noncontributory     #Continuous murmur at RUSB and LUSB: Noted on prior admissions. Possibly a referred murmur from RUE AV fistula, with flow murmur in setting of severe anemia. Last TTE in 2021 was unremarkable.     Daily Checklist:  Diet: Regular Diet  DVT PPx: Contraindicated - High Risk for Bleeding/Active Bleeding  Electrolytes: No Repletion Needed  Code Status: Full Code  Dispo:  Floor    Team Contact Information:   Primary Team: Nephrology ([facility])  Primary Resident: Jen Mow, MD  Resident's Pager: [phone] (Nephrology Intern - Tower)    Interval History:   No acute events overnight.  Bleeding remains stable with  occasional blood in toilet but significantly less frequency of changing pads.  Psychiatry saw patient today.  Agrees with above-stated plan.  Patient has Esgic available for pain, but has not used as needed in the last 24 hours.    Objective:   Temp:  [36.8 ??C (98.2 ??F)-36.9 ??C (98.4 ??F)] 36.8 ??C (98.2 ??F)  Heart Rate:  [73-85] 76  Resp:  [18-19] 18  BP: (115-133)/(67-80) 133/80  SpO2:  [100 %] 100 %,   Intake/Output Summary (Last 24 hours) at 04/09/2023 1600  Last data filed at 04/09/2023 0915  Gross per 24 hour   Intake 1000 ml   Output 600 ml   Net 400 ml   ,   Wt Readings from Last 3 Encounters:   04/06/23 62.5 kg (137 lb 10.8 oz)   03/14/23 66.9 kg (147 lb 6.4 oz)   03/07/23 67.3 kg (148 lb 6.4 oz)       Gen: NAD, converses pleasantly  HENT: atraumatic, normocephalic  Heart: RRR  Lungs: CTAB, no crackles or wheezes  Abdomen: soft, NTND  Extremities: No edema    This patient was seen and discussed in detail with physician who attested above who agrees with the above assessment and plan for this patient.    Jen Mow, MD  [facility] Neurology, PGY-1

## 2023-04-10 LAB — SLIDE REVIEW

## 2023-04-10 LAB — MAGNESIUM: MAGNESIUM: 1.9 mg/dL (ref 1.6–2.6)

## 2023-04-10 LAB — CBC W/ AUTO DIFF
BASOPHILS ABSOLUTE COUNT: 0 10*9/L (ref 0.0–0.1)
BASOPHILS RELATIVE PERCENT: 1.2 %
EOSINOPHILS ABSOLUTE COUNT: 0.1 10*9/L (ref 0.0–0.5)
EOSINOPHILS RELATIVE PERCENT: 8.9 %
HEMATOCRIT: 22.7 % — ABNORMAL LOW (ref 34.0–44.0)
HEMOGLOBIN: 8 g/dL — ABNORMAL LOW (ref 11.3–14.9)
LYMPHOCYTES ABSOLUTE COUNT: 0.2 10*9/L — ABNORMAL LOW (ref 1.1–3.6)
LYMPHOCYTES RELATIVE PERCENT: 21.9 %
MEAN CORPUSCULAR HEMOGLOBIN CONC: 35.1 g/dL (ref 32.0–36.0)
MEAN CORPUSCULAR HEMOGLOBIN: 35.2 pg — ABNORMAL HIGH (ref 25.9–32.4)
MEAN CORPUSCULAR VOLUME: 100.1 fL — ABNORMAL HIGH (ref 77.6–95.7)
MEAN PLATELET VOLUME: 8.9 fL (ref 6.8–10.7)
MONOCYTES ABSOLUTE COUNT: 0.1 10*9/L — ABNORMAL LOW (ref 0.3–0.8)
MONOCYTES RELATIVE PERCENT: 9.2 %
NEUTROPHILS ABSOLUTE COUNT: 0.6 10*9/L — ABNORMAL LOW (ref 1.8–7.8)
NEUTROPHILS RELATIVE PERCENT: 58.8 %
PLATELET COUNT: 71 10*9/L — ABNORMAL LOW (ref 150–450)
RED BLOOD CELL COUNT: 2.27 10*12/L — ABNORMAL LOW (ref 3.95–5.13)
RED CELL DISTRIBUTION WIDTH: 21.2 % — ABNORMAL HIGH (ref 12.2–15.2)
WBC ADJUSTED: 1 10*9/L — ABNORMAL LOW (ref 3.6–11.2)

## 2023-04-10 LAB — BASIC METABOLIC PANEL
ANION GAP: 12 mmol/L (ref 5–14)
BLOOD UREA NITROGEN: 41 mg/dL — ABNORMAL HIGH (ref 9–23)
BUN / CREAT RATIO: 14
CALCIUM: 8.9 mg/dL (ref 8.7–10.4)
CHLORIDE: 109 mmol/L — ABNORMAL HIGH (ref 98–107)
CO2: 19 mmol/L — ABNORMAL LOW (ref 20.0–31.0)
CREATININE: 2.98 mg/dL — ABNORMAL HIGH (ref 0.55–1.02)
EGFR CKD-EPI (2021) FEMALE: 20 mL/min/{1.73_m2} — ABNORMAL LOW (ref >=60)
GLUCOSE RANDOM: 97 mg/dL (ref 70–179)
POTASSIUM: 4.9 mmol/L — ABNORMAL HIGH (ref 3.4–4.8)
SODIUM: 140 mmol/L (ref 135–145)

## 2023-04-10 LAB — HEPATIC FUNCTION PANEL
ALBUMIN: 3.4 g/dL (ref 3.4–5.0)
ALKALINE PHOSPHATASE: 63 U/L (ref 46–116)
ALT (SGPT): 51 U/L — ABNORMAL HIGH (ref 10–49)
AST (SGOT): 35 U/L — ABNORMAL HIGH (ref <=34)
BILIRUBIN DIRECT: 0.6 mg/dL — ABNORMAL HIGH (ref 0.00–0.30)
BILIRUBIN TOTAL: 1.3 mg/dL — ABNORMAL HIGH (ref 0.3–1.2)
PROTEIN TOTAL: 6.3 g/dL (ref 5.7–8.2)

## 2023-04-10 LAB — PHOSPHORUS: PHOSPHORUS: 3.2 mg/dL (ref 2.4–5.1)

## 2023-04-10 LAB — TACROLIMUS LEVEL, TROUGH: TACROLIMUS, TROUGH: 5.9 ng/mL (ref 5.0–15.0)

## 2023-04-10 MED ORDER — TACROLIMUS XR 1 MG TABLET,EXTENDED RELEASE 24 HR
ORAL_TABLET | Freq: Every day | ORAL | 0 refills | 30.00 days | Status: CP
Start: 2023-04-10 — End: ?

## 2023-04-10 MED ORDER — TACROLIMUS XR 4 MG TABLET,EXTENDED RELEASE 24 HR
ORAL_TABLET | Freq: Every day | ORAL | 11 refills | 30.00 days | Status: CP
Start: 2023-04-10 — End: 2024-04-09
  Filled 2023-04-10: qty 120, 30d supply, fill #0

## 2023-04-10 MED ORDER — SODIUM BICARBONATE 650 MG TABLET
ORAL_TABLET | Freq: Two times a day (BID) | ORAL | 0 refills | 30.00 days | Status: CP
Start: 2023-04-10 — End: 2023-05-10

## 2023-04-10 MED ORDER — METRONIDAZOLE 500 MG TABLET
ORAL_TABLET | Freq: Two times a day (BID) | ORAL | 0 refills | 4.00 days | Status: CP
Start: 2023-04-10 — End: ?
  Filled 2023-04-10: qty 7, 4d supply, fill #0

## 2023-04-10 MED ORDER — MIRTAZAPINE 15 MG TABLET
ORAL_TABLET | Freq: Every evening | ORAL | 2 refills | 30.00 days | Status: CP
Start: 2023-04-10 — End: 2023-07-09
  Filled 2023-04-10: qty 30, 30d supply, fill #0

## 2023-04-10 MED ORDER — MEDROXYPROGESTERONE 10 MG TABLET
ORAL_TABLET | 1 refills | 0.00 days | Status: CP
Start: 2023-04-10 — End: ?
  Filled 2023-04-10: qty 58, 11d supply, fill #0

## 2023-04-10 MED ORDER — METHOCARBAMOL 500 MG TABLET
ORAL_TABLET | Freq: Three times a day (TID) | ORAL | 0 refills | 30.00 days | Status: CP | PRN
Start: 2023-04-10 — End: 2023-05-10
  Filled 2023-04-10: qty 90, 30d supply, fill #0

## 2023-04-10 MED ORDER — HYDROCORTISONE 2.5 % TOPICAL CREAM
Freq: Two times a day (BID) | TOPICAL | 0 refills | 0.00 days | Status: CP
Start: 2023-04-10 — End: 2024-04-09

## 2023-04-10 MED ADMIN — escitalopram oxalate (LEXAPRO) tablet 10 mg: 10 mg | ORAL | @ 14:00:00 | Stop: 2023-04-10

## 2023-04-10 MED ADMIN — ciprofloxacin HCl (CIPRO) tablet 500 mg: 500 mg | ORAL | @ 18:00:00 | Stop: 2023-04-10

## 2023-04-10 MED ADMIN — medroxyPROGESTERone (PROVERA) tablet 20 mg: 20 mg | ORAL | @ 14:00:00 | Stop: 2023-04-10

## 2023-04-10 MED ADMIN — tacrolimus (ENVARSUS XR) extended release tablet 19 mg: 19 mg | ORAL | @ 14:00:00 | Stop: 2023-04-10

## 2023-04-10 MED ADMIN — folic acid (FOLVITE) tablet 1 mg: 1 mg | ORAL | @ 14:00:00 | Stop: 2023-04-10

## 2023-04-10 MED ADMIN — medroxyPROGESTERone (PROVERA) tablet 20 mg: 20 mg | ORAL | @ 18:00:00 | Stop: 2023-04-10

## 2023-04-10 MED ADMIN — mirtazapine (REMERON) tablet 15 mg: 15 mg | ORAL | @ 02:00:00

## 2023-04-10 MED ADMIN — sodium bicarbonate tablet 1,300 mg: 1300 mg | ORAL | @ 11:00:00 | Stop: 2023-04-10

## 2023-04-10 MED ADMIN — metroNIDAZOLE (FLAGYL) tablet 500 mg: 500 mg | ORAL | @ 02:00:00 | Stop: 2023-04-14

## 2023-04-10 MED ADMIN — hydrocortisone 2.5 % cream: TOPICAL | @ 11:00:00 | Stop: 2023-04-10

## 2023-04-10 MED ADMIN — melatonin tablet 3 mg: 3 mg | ORAL | @ 02:00:00

## 2023-04-10 MED ADMIN — metroNIDAZOLE (FLAGYL) tablet 500 mg: 500 mg | ORAL | @ 14:00:00 | Stop: 2023-04-10

## 2023-04-10 MED ADMIN — acetaminophen (TYLENOL) tablet 650 mg: 650 mg | ORAL | @ 02:00:00

## 2023-04-10 MED ADMIN — sodium bicarbonate tablet 1,300 mg: 1300 mg | ORAL | @ 18:00:00 | Stop: 2023-04-10

## 2023-04-10 MED ADMIN — acetaminophen (TYLENOL) tablet 650 mg: 650 mg | ORAL | @ 18:00:00 | Stop: 2023-04-10

## 2023-04-10 MED FILL — ENVARSUS XR 1 MG TABLET,EXTENDED RELEASE: ORAL | 30 days supply | Qty: 90 | Fill #0

## 2023-04-10 NOTE — Unmapped (Signed)
 Physician Discharge Summary [facility]  3 [facility]  [address]  [place] [address]  Dept: [phone]  Loc: [phone]     Identifying Information:   Elaine Koch  05-26-1985  [id number]    Primary Care Physician: Reece Leader, MD     Code Status: Full Code    Admit Date: 04/06/2023    Discharge Date: 04/10/2023     Discharge To: Home    Discharge Service: [facility] - Nephrology Floor Team (MED B - Tower)     Discharge Attending Physician: Posey Boyer True, MD    Discharge Diagnoses:   Principal Problem:    Vaginal bleeding (POA: Unknown)  Active Problems:    Anemia in stage 3b chronic kidney disease (CMS-HCC) (POA: Yes)    History of diabetes mellitus, type I (POA: Yes)    Sickle cell trait (CMS-HCC) (POA: Yes)    History of simultaneous kidney and pancreas transplant (CMS-HCC) (POA: Not Applicable)    History of migraine (POA: Not Applicable)    Anemia (POA: Unknown)    Acute kidney injury superimposed on chronic kidney disease (CMS-HCC) (POA: Unknown)    Recurrent Clostridioides difficile diarrhea (POA: Unknown)    Immunosuppressed status (CMS-HCC) (POA: Unknown)    Pancytopenia (CMS-HCC) (POA: Unknown)    Elevated liver enzymes (POA: Unknown)    Depression (POA: Unknown)    Hyperkalemia (POA: Unknown)    Metabolic acidosis (POA: Unknown)  Resolved Problems:    * No resolved hospital problems. *    Outpatient Follow-up Items  -Please obtain tacrolimus level on Monday or Tuesday (3/3 or 3/4) of next week.  -Please follow-up with transplant medicine, Dr. Neale Burly, 04/16/2023  -Please follow-up with psychiatry, Dr. Gaye Alken, 04/29/2023  -Please follow-up with OB/GYN, Dr. Elyse Jarvis, 05/01/2023  -Please follow-up with nephrology, Dr. Deno Etienne, 07/09/2023  -Please follow-up with Dr. Lucretia Roers, your primary care Internal Medicine doctor, 07/11/2023    Medication changes  -Patient will take ciprofloxacin 500 mg daily for 5 days total.  -Patient will take oral Flagyl 2 times daily.  -Patient will take mirtazapine nightly.  -Patient will take Provera taper as listed:  - Continue Provera taper per Gynecology              + Week 1: Provera 20 mg TID x7 days  + Week 2: Provera 20 mg BID x7 days  + Week 3: Provera 20 mg daily until outpatient clinic follow-up    Hospital Course:   Elaine Koch is a 38 y.o. 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     Active Problems     #Abnormal Vaginal Bleeding, stable  #Macrocytic Anemia 2/2 azathioprine  #Recurrent BV  Hgb 4.8 on admission (baseline 8-9), now s/p 3u pRBCs with appropriate response to 7.8, stable. Intermittent spotting with clots associated with lightheadedness and dizziness for 6 days in addition to 2 weeks of diarrhea. Chronic macrocytic anemia since 2022; iron panel Nov 2024 normal; B12 normal, folate low. Hx 2 prior D&Cs in 2023 and 2024. LMP 03/10/23. History intramural fibroids and hemorrhagic ovarian cyst (seen on ultrasound Dec 2024). At [facility] ED, cervical os closed and TVUS unremarkable.  HCG negative. 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 Differential initially included acute blood loss includes fibroid bleed,  CMV endometritis, menorrhagia, vaginal lac; G/C cervical swab negative. Possibly with underlying coagulopathy, such as acquired vWD, uremic platelet dysfunction, liver dysfunction (AST/ALT elevated), though this is less likely given no history of bleeding before this episode. Given lack of other etiology found structurally nor via anemia workup, most likely secondary to patient's existing fibroids, but gynecology consult mentioned multifactorial.  Gynecology recommended a Provera taper and to stop OCP. Patient's bleeding improved. Had only been noticing blood in the toilet, but has not been changing pads as frequently by time of discharge. Hgb stable 8.    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 Carlene Coria. Cr 3.3 (baseline 1.8-2.1). K+ 5.4, bicarb 16.3. UA without evidence of infection. Suspect ATN 2/2 hypoperfusion injury iso anemia and GI losses vs rejection. Hyperkalemic acidosis likely 2/2 tubular injury; diarrhea (below) contributing to metabolic acidosis and hypovolemia. HDS, clinically somewhat hypovolemic. Poor PO in due to low appetite, but no N/V. Patient with likely ATN.  Patient's creatinine is increased since yesterday.  Attempted oral rehydration protocol yesterday without improvement in labs, although slight (3.12-->3.25-->3.43). Low threshold to begin IV fluid therapy.  Concern for dilutional effect given patient's anemia.  Will attempt oral rehydration again today, with plans to attempt IV fluid therapy if there is no improvement.  We provided gentle fluid resuscitation and oral rehydration.  No indications for emergent HD at this time.  Continued home bicarb and use Lokelma to normalize potassium.  Immunosuppression as below.  Consulted psychiatry as below.     # Diarrhea, Non-Bloody - Hx Recurrent C.diff, improving  2 weeks of non-bloody diarrhea a/w severe bloating.  No recent antibiotic usage, abdominal pain, fevers.  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 Outpatient provider January 2025 recommended SIBO testing if persistent.  Also considered IBS. Will perform basic infectious workup and provide supportive care at this time.  Patient's diarrhea improved throughout admission.  GI PP found to have positive Yersinia.  Patient started on ciprofloxacin daily for 5 days.    #Immunosuppression - Pancytopenia - Neutropenia  WBC 0.7, ANC 0.3 on 2/24, improved s/p GCSF. Pancytopenia from azathioprine, currently held. Will discuss with her transplant nephrologist and consider switch to MMF.  S/p Filgrastrim 300 mcg x1 on 2/24. Continued home Tacrolimus 18mg  daily: goal 8-10 given her pancreas transplant; repeat trough measurement per pharmacist recommendation. Held Azathioprine 100 mg daily.. Patient is not on MMF due to desire for pregnancy, but patient does not desire pregnancy at this time; ongoing discussion, but may be open to switch from AZA to MMF.     #Pain management - Headache - History of Migraines:  Chronic leg pain since 2022 and history of migraines. Previously on triptan; opioid naive.  Patient found relief with home Esgic.    Chronic/Stable Problems  #Elevated AST/ALT, resolving  75/107 in ER, from normal baseline in Dec 2024. Likely shock liver from hypovolemia and anemia. Will continue to trend, obtain RUQ [place] if persistent despite resuscitation. Trending down with resuscitation.     #Type 1 DM s/p pancreas transplant 2021: Amylase, lipase, C-peptide wnl at outpatient visit; lipase on admission wnl. Immunosuppression as above.     #Depression/anxiety: Stable on Lexapro, she has been working with Dr. Neale Burly with plans to establish with mental health locally. Psychiatry consulted per transplant medicine.  Recommended continuing Lexapro, starting mirtazapine, and TSH.  TSH of 1.5, wnl.     # Hx  of Hemorrhoids: Patient has history of external large non-thrombosed hemorrhoids. Used OTC gels at home. At recent OP IM visit, denied issues with constipation.     #Sickle Cell trait: Noncontributory     #Continuous murmur at RUSB and LUSB: Noted on prior admissions. Possibly a referred murmur from RUE AV fistula, with flow murmur in setting of severe anemia. Last TTE in 2021 was unremarkable.     ______________________________________________________________________  Discharge Medications:      Your Medication List        PAUSE taking these medications      azathioprine 50 mg tablet  Wait to take this until your doctor or other care provider tells you to start again.  Commonly known as: IMURAN  Take 2 tablets (100 mg total) by mouth daily            START taking these medications      ciprofloxacin HCl 500 MG tablet  Commonly known as: CIPRO  Take 1 tablet (500 mg total) by mouth daily for 4 days.  Start taking on: April 11, 2023     folic acid 1 MG tablet  Commonly known as: FOLVITE  Take 1 tablet (1 mg total) by mouth daily.  Start taking on: April 11, 2023     hydrocortisone 2.5 % cream  Apply topically two (2) times a day.     medroxyPROGESTERone 10 MG tablet  Commonly known as: PROVERA  Continue Provera taper as below:    + Week 1 (2/24-3/3): Provera 20 mg TID x7 days (you received 1 dose in the hospital, so you will have an extra day here  + Week 2 (3/4-3/10): Provera 20 mg BID x7 days  + Week (3/11- ): Provera 20 mg daily until outpatient clinic follow-up     methocarbamol 500 MG tablet  Commonly known as: ROBAXIN  Take 1 tablet (500 mg total) by mouth every eight (8) hours as needed (muscle pain).     mirtazapine 15 MG tablet  Commonly known as: REMERON  Take 1 tablet (15 mg total) by mouth nightly.            CHANGE how you take these medications      ENVARSUS XR 1 mg Tb24 extended release tablet  Generic drug: tacrolimus  Take 3 tablets (3 mg total) by mouth daily. Take 3 tablets (3 mg total) by mouth daily with FOUR 4mg  tablets for total daily dose of 19mg   What changed:   how much to take  additional instructions     ENVARSUS XR 4 mg Tb24 extended release tablet  Generic drug: tacrolimus  Take 4 tablets (16 mg total) by mouth daily. Take with three 1 mg tablets for a total of 19 mg  What changed: additional instructions     metroNIDAZOLE 0.75 % (37.5mg /5 gram) vaginal gel  Commonly known as: METROGEL  Insert into the vagina 3 (three) times a week.  What changed: Another medication with the same name was added. Make sure you understand how and when to take each.     metroNIDAZOLE 500 MG tablet  Commonly known as: FLAGYL  Take 1 tablet (500 mg total) by mouth two (2) times a day.  What changed: You were already taking a medication with the same name, and this prescription was added. Make sure you understand how and when to take each.            CONTINUE taking these medications      acetaminophen 325 MG  tablet  Commonly known as: Tylenol  Take 2 tablets (650 mg total) by mouth every four (4) hours as needed for pain.     butalbital-acetaminophen-caffeine 50-325-40 mg per tablet  Commonly known as: ESGIC  Take 1 tablet by mouth every four (4) hours as needed for pain.     escitalopram oxalate 5 MG tablet  Commonly known as: LEXAPRO  Take 2 tablets (10 mg total) by mouth daily.     sodium bicarbonate 650 mg tablet  Take 2 tablets (1,300 mg total) by mouth two (2) times a day.              Allergies:  Iodinated contrast media, Nickel, Privigen [immun glob g(igg)-pro-iga 0-50], Propranolol, Eye irrigating solution [ophthalmic irrigation solution], Iodine, Naltrexone, and Uni-cortrom  ______________________________________________________________________  Pending Test Results:  Pending Labs       Order Current Status    CBC w/ Differential Preliminary result    CBC w/ Differential Preliminary result            Most Recent Labs:  All lab results last 24 hours -   Recent Results (from the past 24 hours)   Hepatic Function Panel    Collection Time: 04/10/23 10:11 AM   Result Value Ref Range    Albumin 3.4 3.4 - 5.0 g/dL    Total Protein 6.3 5.7 - 8.2 g/dL    Total Bilirubin 1.3 (H) 0.3 - 1.2 mg/dL    Bilirubin, Direct 1.61 (H) 0.00 - 0.30 mg/dL    AST 35 (H) <=09 U/L    ALT 51 (H) 10 - 49 U/L    Alkaline Phosphatase 63 46 - 116 U/L   Magnesium Level    Collection Time: 04/10/23 10:11 AM   Result Value Ref Range    Magnesium 1.9 1.6 - 2.6 mg/dL   Tacrolimus Level, Trough    Collection Time: 04/10/23 10:11 AM   Result Value Ref Range    Tacrolimus, Trough 5.9 5.0 - 15.0 ng/mL   CBC w/ Differential    Collection Time: 04/10/23 10:11 AM   Result Value Ref Range    WBC 1.0 (L) 3.6 - 11.2 10*9/L    RBC 2.27 (L) 3.95 - 5.13 10*12/L    HGB 8.0 (L) 11.3 - 14.9 g/dL    HCT 60.4 (L) 54.0 - 44.0 %    MCV 100.1 (H) 77.6 - 95.7 fL    MCH 35.2 (H) 25.9 - 32.4 pg    MCHC 35.1 32.0 - 36.0 g/dL    RDW 98.1 (H) 19.1 - 15.2 %    MPV 8.9 6.8 - 10.7 fL    Platelet 71 (L) 150 - 450 10*9/L    Macrocytosis Slight (A) Not Present    Anisocytosis Moderate (A) Not Present   Phosphorus Level    Collection Time: 04/10/23 10:11 AM   Result Value Ref Range    Phosphorus 3.2 2.4 - 5.1 mg/dL   Basic Metabolic Panel    Collection Time: 04/10/23 10:11 AM   Result Value Ref Range    Sodium 140 135 - 145 mmol/L    Potassium 4.9 (H) 3.4 - 4.8 mmol/L    Chloride 109 (H) 98 - 107 mmol/L    CO2 19.0 (L) 20.0 - 31.0 mmol/L    Anion Gap 12 5 - 14 mmol/L    BUN 41 (H) 9 - 23 mg/dL    Creatinine 4.78 (H) 0.55 - 1.02 mg/dL    BUN/Creatinine Ratio 14  eGFR CKD-EPI (2021) Female 20 (L) >=60 mL/min/1.20m2    Glucose 97 70 - 179 mg/dL    Calcium 8.9 8.7 - 16.1 mg/dL     Microbiology -   Microbiology Results (last day)       Procedure Component Value Date/Time Date/Time    C. Difficile Assay [[id number]]  (Normal) Collected: 04/09/23 1522    Lab Status: Final result Specimen: Stool  Updated: 04/10/23 0910     C. Diff Result Negative     Comment: Clostridium difficile NOT detected       Narrative:      The methodology of this test detects C. difficile toxin A and/or toxin B, by EIA.        GI Pathogen Panel [[id number]]  (Abnormal) Collected: 04/09/23 1522    Lab Status: Final result Specimen: Stool  Updated: 04/10/23 0314     Adenovirus F 40/41 PCR Not Detected     Astrovirus PCR Not Detected     Norovirus GI/GII PCR Not Detected     Rotavirus A PCR Not Detected     Campylobacter PCR Not Detected     E. coli O157 Not Performed     Enteropathogenic E. coli (EPEC) PCR Not Detected     Enterotoxigenic E. coli (ETEC) PCR Not Detected     Plesiomonas shigelloides PCR Not Detected     Salmonella PCR Not Detected Shiga-like Toxin-Producing E. coli (STEC) PCR Not Detected     Shigella/ Enteroinvasive E. coli (EIEC) PCR Not Detected     Yersinia enterocolitica PCR Detected     Cryptosporidium PCR Not Detected     Cyclospora cayetanensis PCR Not Detected     Entamoeba histolytica PCR Not Detected     Giardia lamblia PCR Not Detected    Narrative:      Test performed using the QIAStat-Dx Gastrointestinal Panel 2, an FDA-cleared qualitative molecular multiplex test. Positive results for Campylobacter, Cryptosporidium, Cyclospora, shiga toxin producing E. coli (STEC), Salmonella, and Shigella must be reported by both the physician and laboratory to the [place] [facility] of Public [facility]. Full test limitations can be reviewed here: [email or web address]          CBC - Results in Past 30 Days  Result Component Current Result Ref Range Previous Result Ref Range   HCT 22.7 (L) (04/10/2023) 34.0 - 44.0 % 22.6 (L) (04/09/2023) 34.0 - 44.0 %   HGB 8.0 (L) (04/10/2023) 11.3 - 14.9 g/dL 8.0 (L) (08/01/3084) 57.8 - 14.9 g/dL   MCH 46.9 (H) (08/10/5282) 25.9 - 32.4 pg 34.9 (H) (04/09/2023) 25.9 - 32.4 pg   MCHC 35.1 (04/10/2023) 32.0 - 36.0 g/dL 13.2 (4/40/1027) 25.3 - 36.0 g/dL   MCV 664.4 (H) (0/34/7425) 77.6 - 95.7 fL 98.4 (H) (04/09/2023) 77.6 - 95.7 fL   MPV 8.9 (04/10/2023) 6.8 - 10.7 fL 8.1 (04/09/2023) 6.8 - 10.7 fL   Platelet 71 (L) (04/10/2023) 150 - 450 10*9/L 78 (L) (04/09/2023) 150 - 450 10*9/L   RBC 2.27 (L) (04/10/2023) 3.95 - 5.13 10*12/L 2.29 (L) (04/09/2023) 3.95 - 5.13 10*12/L   WBC 1.0 (L) (04/10/2023) 3.6 - 11.2 10*9/L 1.0 (L) (04/09/2023) 3.6 - 11.2 10*9/L     BMP - Results in Past 30 Days  Result Component Current Result Ref Range Previous Result Ref Range   BUN 41 (H) (04/10/2023) 9 - 23 mg/dL 44 (H) (9/56/3875) 9 - 23 mg/dL   Chloride 643 (H) (05/10/5186) 98 - 107 mmol/L 105 (04/09/2023) 98 - 107 mmol/L   CO2 19.0 (L) (  04/10/2023) 20.0 - 31.0 mmol/L 22.0 (04/09/2023) 20.0 - 31.0 mmol/L   Creatinine 2.98 (H) (04/10/2023) 0.55 - 1.02 mg/dL 4.03 (H) (4/74/2595) 6.38 - 1.02 mg/dL   Glucose 97 (7/56/4332) 70 - 179 mg/dL 90 (9/51/8841) 70 - 660 mg/dL   Potassium 4.9 (H) (08/11/1599) 3.4 - 4.8 mmol/L 4.9 (H) (04/09/2023) 3.4 - 4.8 mmol/L   Sodium 140 (04/10/2023) 135 - 145 mmol/L 140 (04/09/2023) 135 - 145 mmol/L     Coagulation -   No results found for requested labs within last 30 days.     Cardiac markers -   No results found for requested labs within last 30 days.     ABGs-   No results found for requested labs within last 30 days.     LFT's - Results in Past 30 Days  Result Component Current Result Ref Range Previous Result Ref Range   Albumin 3.4 (04/10/2023) 3.4 - 5.0 g/dL 3.4 (0/93/2355) 3.4 - 5.0 g/dL   Alkaline Phosphatase 63 (04/10/2023) 46 - 116 U/L 56 (04/09/2023) 46 - 116 U/L   ALT 51 (H) (04/10/2023) 10 - 49 U/L 57 (H) (04/09/2023) 10 - 49 U/L   AST 35 (H) (04/10/2023) <=34 U/L 34 (04/09/2023) <=34 U/L   Bilirubin, Direct 0.60 (H) (04/10/2023) 0.00 - 0.30 mg/dL 7.32 (H) (03/15/5425) 0.62 - 0.30 mg/dL   Total Bilirubin 1.3 (H) (04/10/2023) 0.3 - 1.2 mg/dL 1.8 (H) (3/76/2831) 0.3 - 1.2 mg/dL     Toxicology screen -   No results found for requested labs within last 30 days.   [      Relevant Studies/Radiology:    ______________________________________________________________________  Discharge Instructions:                Follow Up instructions and Outpatient Referrals     Ambulatory Referral to Gynecology      Reason for referral: abnormal uterine bleeding, s/p hospitalization for   acute blood loss anemia followed by gyn consult team    Discharge instructions          Appointments which have been scheduled for you      Apr 16, 2023 12:00 PM  (Arrive by 11:30 AM)  RETURN VIDEO MYCHART with Theda Sers, PhD  [facility] TRANSPLANT SURGERY [facility] REGION) [address]  [place] [address]  [phone]   Please sign into My [place] Chart at least 15 minutes before your appointment to complete the eCheck-In process. You must complete eCheck-In before you can start your video visit. We also recommend testing your audio and video connection to troubleshoot any issues before your visit begins. Click ???Join Video Visit??? to complete these checks. Once you have completed eCheck-In and tested your audio and video, click ???Join Call??? to connect to your visit.     For your video visit, you will need a computer with a working camera, speaker and microphone, a smartphone, or a tablet with internet access.    My [place] Chart enables you to manage your health, send non-urgent messages to your provider, view your test results, schedule and manage appointments, and request prescription refills securely and conveniently from your computer or mobile device.    You can go to [email or web address] to sign in to your My [place] Chart account with your username and password. If you have forgotten your username or password, please choose the ???Forgot Username???? and/or ???Forgot Password???? links to gain access. You also can access your My [place] Chart account with the free MyChart mobile  app for Android or iPhone.    If you need assistance accessing your My [place] Chart account or for assistance in reaching your provider's office to reschedule or cancel your appointment, please call [facility] [phone].         Apr 29, 2023 10:30 AM  (Arrive by 10:20 AM)  NEW PSYCHOSOMATIC with Vickki Hearing, MD  [facility] PSYCHIATRY [facility] AT [facility] REGION) [address]  [address]  [place] [address]  [phone]        May 01, 2023 2:20 PM  (Arrive by 2:05 PM)  NEW  GYN with Servando Snare, CNM  [facility] [address] [address] [place] [facility] [place]) [address]  [address]  [place] [address]  [phone]        Jul 09, 2023 9:30 AM  (Arrive by 9:15 AM)  RETURN NEPHROLOGY POST with Brayton Caves, MD  [facility] KIDNEY TRANSPLANT [place] REGION) [address]  [facility] 1 through 4  [place] [address]  [phone]        Jul 11, 2023 3:00 PM  (Arrive by 2:45 PM)  RETURN CONTINUITY with Reece Leader, MD  [facility] INTERNAL MEDICINE [place] [facility] REGION) [address]  [place] [address] through [address]  [place] [address]  [phone]             ______________________________________________________________________  Discharge Day Services:  BP 117/68  - Pulse 84  - Temp 36.8 ??C (98.2 ??F) (Oral)  - Resp 20  - Ht 165.1 cm (5' 5)  - Wt 62.5 kg (137 lb 10.8 oz)  - LMP 03/10/2023  - SpO2 98%  - BMI 22.91 kg/m??     Pt seen on the day of discharge and determined appropriate for discharge.    Condition at Discharge: fair    Length of Discharge: I spent greater than 30 mins in the discharge of this patient.    This patient was seen and discussed in detail with physician who attested above who agrees with the above assessment and plan for this patient.    Jen Mow, MD  [facility] Neurology, PGY-1

## 2023-04-10 NOTE — Unmapped (Signed)
 Pt report of having some bleeding after having the bowel movements.hydrocortisone cream prescribed.taken tylenol for head ache.had two loose stools .VSS. Taking adequate po fluids. Asleep this time.plan of care updated.will continue to monitor the pt.       Problem: Adult Inpatient Plan of Care  Goal: Plan of Care Review  Outcome: Progressing  Goal: Patient-Specific Goal (Individualized)  Outcome: Progressing  Goal: Absence of Hospital-Acquired Illness or Injury  Outcome: Progressing  Goal: Optimal Comfort and Wellbeing  Outcome: Progressing  Goal: Readiness for Transition of Care  Outcome: Progressing  Goal: Rounds/Family Conference  Outcome: Progressing     Problem: Infection  Goal: Absence of Infection Signs and Symptoms  Outcome: Progressing     Problem: Wound  Goal: Optimal Coping  Outcome: Progressing  Goal: Optimal Functional Ability  Outcome: Progressing  Goal: Absence of Infection Signs and Symptoms  Outcome: Progressing  Goal: Improved Oral Intake  Outcome: Progressing  Goal: Optimal Pain Control and Function  Outcome: Progressing  Goal: Skin Health and Integrity  Outcome: Progressing  Goal: Optimal Wound Healing  Outcome: Progressing     Problem: Fluid Volume Deficit  Goal: Fluid Balance  Outcome: Progressing     Problem: Pain Acute  Goal: Optimal Pain Control and Function  Outcome: Progressing

## 2023-04-11 DIAGNOSIS — Z09 Encounter for follow-up examination after completed treatment for conditions other than malignant neoplasm: Principal | ICD-10-CM

## 2023-04-11 MED ORDER — TACROLIMUS XR 1 MG TABLET,EXTENDED RELEASE 24 HR
Freq: Every day | ORAL | 0 refills | 0.00 days | Status: CN
Start: 2023-04-11 — End: ?

## 2023-04-11 MED ORDER — CIPROFLOXACIN 500 MG TABLET
ORAL_TABLET | Freq: Every day | ORAL | 0 refills | 4.00 days | Status: CP
Start: 2023-04-11 — End: 2023-04-15
  Filled 2023-04-10: qty 4, 4d supply, fill #0

## 2023-04-11 MED ORDER — FOLIC ACID 1 MG TABLET
ORAL_TABLET | Freq: Every day | ORAL | 11 refills | 30.00 days | Status: CP
Start: 2023-04-11 — End: 2024-04-10
  Filled 2023-04-10 – 2023-05-08 (×2): qty 30, 30d supply, fill #0

## 2023-04-14 DIAGNOSIS — K529 Noninfective gastroenteritis and colitis, unspecified: Principal | ICD-10-CM

## 2023-04-14 DIAGNOSIS — Z940 Kidney transplant status: Principal | ICD-10-CM

## 2023-04-16 ENCOUNTER — Ambulatory Visit: Admit: 2023-04-16 | Discharge: 2023-04-16 | Payer: MEDICARE | Attending: Clinical | Primary: Clinical

## 2023-04-16 ENCOUNTER — Ambulatory Visit: Admit: 2023-04-16 | Discharge: 2023-04-16 | Payer: MEDICARE

## 2023-04-16 DIAGNOSIS — K591 Functional diarrhea: Principal | ICD-10-CM

## 2023-04-16 MED ORDER — LOPERAMIDE 2 MG TABLET
ORAL_TABLET | Freq: Four times a day (QID) | ORAL | 2 refills | 15.00 days | Status: CP | PRN
Start: 2023-04-16 — End: 2024-04-15
  Filled 2023-05-14: qty 72, 18d supply, fill #0

## 2023-04-16 NOTE — Unmapped (Signed)
 GI Faculty Visit    A/P: 38 y.o. 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    I do think symptoms are most likely related to recent infection and antibiotics and less likely to be C Diff, especially with how well the patient looks today.  We discussed starting imodium to try to slow down gut motility and repeating infectious studies if symptoms not improving with time.  Would ideally like to minimize antibiotic use especially broad-spectrum like metronidazole but defer to gyn providers how best to do this and treat her bacterial vaginosis.    Elaine Koch, Elaine Koch, Elaine Koch  Gastroenterology and Hepatology, [facility]      HPI:  I have seen Elaine Koch in the past for diarrhea, recurrent C Difficile and GI distress  She presented to the hospital 2/23 with diarrhea and vaginal bleeding.  She had a low hemoglobin at 4.8 (baseline 8-9).  Also 2 weeks of diarrhea.  Has had anemia from chronic kidney disease.  Gyn recommended stopping oral contraceptive pills and taking provera taper.  Patient's vaginal bleeding improved.  She also presented with two weeks of nonbloody diarrhea and severe bloating.  She tested positive for Yersinia 04/09/2023 and was treated with ciprofloxacin for 5 days.    Today, she tells me that since last seeing me, eventually her diarrhea resolved and her new baseline is having 2 formed bowel movements a day, sometimes more frequent for example with [place] food or intermittent diarrhea.  However, about a month prior to the hospitalization she was having abdominal bloating and then a week before her hospitalization for the vaginal bleeding she had diarrhea 5-6 times a day that was loose.  She had eaten some bacon.  She did test positive for Yersinia enterocolitis on stool pathogen testing and was treated for that with ciprofloxacin for 5 days.  She also was treated for bacterial vaginosis with Flagyl.  The bacterial vaginosis is a chronic problem and she is very symptomatic from that.  If she just takes topical medications, often she will get a yeast infection which will also need to be treated.  In any case, she is continue to have watery diarrhea, today she has had 3 watery bowel movements and overnight she had to incontinent liquid bowel movements.  She does not think she has C. difficile infection she has not had any fevers or blood in the stool.  She has used Imodium a bit as needed during this time.      04/09/2023- Yersinia enterocolitica detected on GI pathogen panel; C Diff not detected  04/2022- stool positive ETEC  05/2022- negative for C Diff    Current Outpatient Medications on File Prior to Visit   Medication Sig Dispense Refill    acetaminophen (TYLENOL) 325 MG tablet Take 2 tablets (650 mg total) by mouth every four (4) hours as needed for pain. 100 tablet 2    [Paused] azathioprine (IMURAN) 50 mg tablet Take 2 tablets (100 mg total) by mouth daily 60 tablet 11    butalbital-acetaminophen-caffeine (ESGIC) 50-325-40 mg per tablet Take 1 tablet by mouth every four (4) hours as needed for pain. 30 tablet 2    [EXPIRED] ciprofloxacin HCl (CIPRO) 500 MG tablet Take 1 tablet (500 mg total)  by mouth daily for 4 days. 4 tablet 0    escitalopram oxalate (LEXAPRO) 5 MG tablet Take 2 tablets (10 mg total) by mouth daily. 60 tablet 11    folic acid (FOLVITE) 1 MG tablet Take 1 tablet (1 mg total) by mouth daily. 30 tablet 11    hydrocortisone 2.5 % cream Apply topically two (2) times a day. 30 g 0    medroxyPROGESTERone (PROVERA) 10 MG tablet Continue medroxyprogesterone taper as below:    + Week 1 (2/24-3/3): medroxyprogesterone 20 mg three times daily for 7 days (you received 1 dose in the hospital, so you will have an extra day here  + Week 2 (3/4-3/10): medroxyprogesterone 20 mg twice daily for 7 days  + Week (3/11- ): medroxyprogesterone 20 mg daily until outpatient clinic follow-up 130 tablet 1    methocarbamol (ROBAXIN) 500 MG tablet Take 1 tablet (500 mg total) by mouth every eight (8) hours as needed (muscle pain). 90 tablet 0    metroNIDAZOLE (FLAGYL) 500 MG tablet Take 1 tablet (500 mg total) by mouth two (2) times a day. 7 tablet 0    metroNIDAZOLE (METROGEL) 0.75 % (37.5mg /5 gram) vaginal gel Insert into the vagina 3 (three) times a week. 70 g 6    mirtazapine (REMERON) 15 MG tablet Take 1 tablet (15 mg total) by mouth nightly. 30 tablet 2    sodium bicarbonate 650 mg tablet Take 2 tablets (1,300 mg total) by mouth two (2) times a day. 120 tablet 0    tacrolimus (ENVARSUS XR) 1 mg Tb24 extended release tablet Take 3 tablets (3 mg total) by mouth daily. Take 3 tablets (3 mg total) by mouth daily with FOUR 4mg  tablets for total daily dose of 19mg  90 tablet 0    tacrolimus (ENVARSUS XR) 4 mg Tb24 extended release tablet Take 4 tablets (16 mg total) by mouth daily. Take with three 1 mg tablets for a total of 19 mg 120 tablet 11    [DISCONTINUED] NIFEdipine (PROCARDIA XL) 30 MG 24 hr tablet Take 1 tablet (30 mg total) by mouth daily. 90 tablet 3     No current facility-administered medications on file prior to visit.

## 2023-04-16 NOTE — Unmapped (Signed)
 Transplant Psychology No Show     Pt did not show to scheduled transplant psychology appt today. Writer sent pt video link at appt time, called several minutes later. No answer, left VM.      Theda Sers, Ph.D.  Clinical Psychologist

## 2023-04-18 NOTE — Unmapped (Signed)
 Called and left message.    Patient has recurrent BV.    New NEJM research- treating female partner for BV decreases recurrence rates dramatically.    Recommend her partner gets treated with:  Metronidazole 500 mg twice daily  Clindamycin 2% ointment to penis twice daily  Both for 7 days    Will also send [employer].    Aleatha Borer, MD MSc, Professor  Gastroenterology and Hepatology, [facility]

## 2023-04-21 DIAGNOSIS — Z79899 Other long term (current) drug therapy: Principal | ICD-10-CM

## 2023-04-21 DIAGNOSIS — Z940 Kidney transplant status: Principal | ICD-10-CM

## 2023-04-23 NOTE — Unmapped (Signed)
 Elaine Koch has been contacted in regards to their refill of azathioprine 50 mg tablet (IMURAN). At this time, they have declined refill due to patient waiting on therapy change,possibly stopping . Refill assessment call date has been updated per the patient's request.

## 2023-04-24 ENCOUNTER — Encounter: Admit: 2023-04-24 | Discharge: 2023-04-25 | Payer: MEDICARE

## 2023-04-24 DIAGNOSIS — N760 Acute vaginitis: Principal | ICD-10-CM

## 2023-04-24 DIAGNOSIS — Z940 Kidney transplant status: Principal | ICD-10-CM

## 2023-04-24 DIAGNOSIS — N939 Abnormal uterine and vaginal bleeding, unspecified: Principal | ICD-10-CM

## 2023-04-24 DIAGNOSIS — Z09 Encounter for follow-up examination after completed treatment for conditions other than malignant neoplasm: Principal | ICD-10-CM

## 2023-04-24 DIAGNOSIS — D61818 Other pancytopenia: Principal | ICD-10-CM

## 2023-04-24 DIAGNOSIS — B9689 Other specified bacterial agents as the cause of diseases classified elsewhere: Principal | ICD-10-CM

## 2023-04-24 DIAGNOSIS — F332 Major depressive disorder, recurrent severe without psychotic features: Principal | ICD-10-CM

## 2023-04-24 DIAGNOSIS — R197 Diarrhea, unspecified: Principal | ICD-10-CM

## 2023-04-24 NOTE — Unmapped (Signed)
 [facility] Internal Medicine at [facility]     Are you located in [place]? yes    Reason for visit: Follow up    Questions / Concerns that need to be addressed: no      HCDM reviewed and updated in Epic:    We are working to make sure all of our patients??? wishes are updated in Epic and part of that is documenting a Environmental health practitioner for each patient  A Health Care Decision Maker is someone you choose who can make health care decisions for you if you are not able - who would you most want to do this for you????  is already up to date.    HCDM (patient stated preference): Hicks,Dorothy - Mother - [phone]    HCDM, back-up (If primary HCDM is unavailable): Levonne Lapping - Other [phone]    BPAs completed:  PHQ2, PHQ9, GAD7, AUDIT - Alcohol Screen, and SDOH Screening    __________________________________________________________________________________________    SCREENINGS COMPLETED IN FLOWSHEETS      AUDIT       PHQ2       PHQ9          GAD7       COPD Assessment       Falls Risk

## 2023-04-24 NOTE — Unmapped (Signed)
 Internal Medicine Video Visit    This visit is conducted via video conferencing.    Contact Information  Person Contacted: Patient  Contact Phone number: There are no phone numbers on file.  Is there someone else in the room? No.   Patient agreed to a video visit    Elaine Koch is a 38 y.o. female  participating in a video visit.    Reason for visit:  Hospital Follow Up    Subjective:    She notes that since discharge her vaginal bleeding is fully resolved.  She is continued on Provera as prescribed and is now taking it once a day with plans to follow-up with OB/GYN next week.    She notes her diarrhea has improved a lot. She is having formed stools that are much better.  She had seen her gastroenterologist Dr. Fara Boros last week who had encouraged her to continue to monitor symptoms but not felt that recurrent C. difficile was likely given the improvement in her symptoms.  She is still having some intermittent bloating that is better and has not been present at all today.      She notes her mood is doing awesome. She had been started on mirtazapine which has boosted her appetite while she was inpatient. She had increased Lexapro to 10 mg daily after last visit.     Azathioprine stopped in the hospital but MMF not started.  She is unsure if she should restart azathioprine or is being transitioned to MMF.  She has not heard from her transplant team.    She is coming off a yeast infection. She has not had BV symptoms in a bit. She has not been doing the flagyl gel and was going to resume. Sh ehas not had any symptoms at this time with odor discharge.  She has not been sexually active since September and does not believe that she has a female partner in need of treatment      ______________________________    I have reviewed the problem list, medications, and allergies and have updated/reconciled them if needed.    Objective:  General: Well-appearing woman in no apparent distress  Respirations: Breathing comfortably on room air  Skin: No rashes unclothed exam       PHQ-9 Score:  PHQ-9 Total Score: 7  GAD-7 Score:  GAD-7 Total Score: 5      Assessment & Plan:  1. Hospital discharge follow-up    2. Vaginal bleeding    3. Diarrhea, unspecified type    4. Major depressive disorder, 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)    5. Kidney replaced by transplant    6. Pancytopenia (CMS-HCC)    7. BV (bacterial vaginosis)             Vaginal bleeding  Elaine Koch was recently hospitalized after heavy vaginal bleeding.  The consult note while inpatient with OB/GYN and noted that is likely multifactorial in a transvaginal ultrasound performed and showed a small fibroid.  She was started on Provera taper which she has continued with now resolution of her vaginal bleeding.  She has scheduled follow-up next week with OB/GYN.  Of note she did report that in the past she had had an IUD that fell out and is a bit hesitant to have 1 of these again.  -Continue Provera 20 mg daily per OB/GYN pending clinic appointment    2.  Diarrhea  Elaine Koch has a history of recurrent C. difficile requiring fecal transplant in the  past.  While in the hospital she been diagnosed with [facility] and prescribed a course of ciprofloxacin.  She had had her ongoing diarrhea that slowly improved after discharge.  She followed up with her gastroenterologist postdischarge who encouraged her to continue to monitor symptoms but did not feel that recurrent C. difficile was likely given her clinical improvement.  She notes that her diarrhea has now resolved and she is having well-formed stools.  Will continue to monitor.    3.  Depression and anxiety  At her last visit Elaine Koch had significantly elevated PHQ-9 and reported low mood.  I had increased her Lexapro from 5 mg to 10 mg.  While she was in the hospital mirtazapine was also added as an adjunct of medication in the setting of poor p.o. intake and low mood.  Since discharge she notes that her mood is the best it has been in a long time and her PHQ-9 is significantly improved.  Will continue current regimen.  - Continue Lexapro 10 mg daily  - Continue mirtazapine 15 mg nightly  - Continue following with transplant psychotherapy    PHQ-9 PHQ-9 Total Score   04/24/2023   9:30 AM 7   03/14/2023   7:00 PM 17     4. History of Pancreas/Kidney Transplant (2021) - History of AMR - History of T1DM - History of Pancyctopenia thought likely 2/2 to Azathioprine  Follows with Dr. Carlene Coria of transplant nephrology.  While she was in the hospital she been found to have pancytopenia on her previous regimen of azathioprine and tacrolimus by transition to tacrolimus monotherapy.  The discharge summary noted plans to consider transition to MMF but appears that this was not done and she has not had any contact with her transplant nephrology team to determine if she should resume her azathioprine or plan to transition to MMF.  She said she would reach out to them but also was amenable to me reaching out to Dr. Carlene Coria for further advice.  - Continue tacrolimus per transplant nephrology  - I have messaged Dr. Colonel Bald to follow-up on suggestions for transition MMF versus alternative therapy  --Will encourage consideration of repeat CBC with next Tac level per transplant nephrology    5.  Chronic bacterial vaginosis  Elaine Koch's longstanding BV symptoms had recently improved after completing a course of antifungals.  She notes that she has not had any recent symptoms and in the setting of her abnormal uterine bleeding had stopped using her vaginal Flagyl.  She has some on hand if it does recur.  We discussed recent findings from the [place] Journal of Medicine that it suggested treatment of female partners may help reduce the incidence of recurrence (1) though she notes no sexual partners since September.  - Continue metronidazole    (1) Marcille Blanco, Rosine Beat. Sobel, Bacterial Vaginosis -- Time to Treat Female Partners, [place] Journal of Medicine, 392, [place], [phone]), (2025)./doi/full/[id number]/[id number]     Return in about 3 months (around 07/25/2023).       Staffed with Dr. Ross Marcus seen and discussed      The patient reports they are physically located in [place] and is currently: not at home. I conducted a audio/video visit. I spent  80m 07s on the video call with the patient. I spent an additional 15 minutes on pre- and post-visit activities on the date of service .

## 2023-04-24 NOTE — Unmapped (Signed)
 Addended by: Dannielle Karvonen on: 04/24/2023 02:11 PM     Modules accepted: Level of Service

## 2023-04-27 NOTE — Unmapped (Unsigned)
 [facility]  Psychiatry   Comprehensive New Patient Clinical Assessment - Outpatient      Assessment / Case Formulation:     Elaine Koch presents for evaluation of ***.     Risk Assessment:  A suicide and violence risk assessment was performed as part of this evaluation. The patient is deemed to be at chronic elevated risk for self-harm/suicide given the following factors: {PSY Suicide Risk Factors:22910}. The patient is deemed to be at chronic elevated risk for violence given the following factors: {PSY Violence Risk Factors:22914}. These risk factors are mitigated by the following factors:{PSY Mitigating Factors:22917}. There is no acute risk for suicide or violence at this time. The patient was educated about relevant modifiable risk factors including following recommendations for treatment of psychiatric illness and abstaining from substance abuse.   While future psychiatric events cannot be accurately predicted, the patient does not currently require  acute inpatient psychiatric care and does not currently meet [facility] involuntary commitment criteria.         Plan (including recommendations for additional assessments, services, or support):    ***    Problem: MDD  Status of problem:  {Status of problem:68359}  Interventions:   -- agree with mirtazapine 15 mg PO nightly -- until GFR is >30, would not go above this dose. Started 04/08/2023  -- agree with Lexapro 10 mg daily (increased from 5 mg daily on ~ 04/04/2203)      Patient appears to have the ability and capacity to respond to treatment, including patient or guardian understanding the treatment plan.  Patient has been given information on how to contact this clinician for concerns. They have been instructed to call 911 for emergencies.    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    -recent admission 2/23-2/27 for vaginal bleeding + diarrhea, AKI on allograft and metabolic acidosis; while hospitalized, lexapro increased to 10 mg ~04/04/23 and mirtazapine 15 mg nightly  -reccurrent pregnancy losses x3, as recent as Oct-Nov 2024 but does not feel this affects mood  -assessment: symptoms consistent with a diagnosis of MDD-- she has had blah mood, and prominent anhedonia for 5 years, low appetite, and some hypersomnia, as well as some prominent negative thoughts about herself, and some chronic passive death with (without plan or intent, and with strong protective factor in wanting to be alive to care for her son).     Current psychotropic medications:  -- mirtazapine 15 mg nightly  -- Lexapro 10 mg daily    -has been following with transplant psychologist, Bradd Canary, had been re-referred 12/13/22    Mood Symptoms:    Mood:{MOOD:23791:a} {RRPASTMOODSYMPTOMS:23792:a}  Sleep:{ABHSLEEP:47363:a}  interests:   hopelessness/helplessness:   activity level/energy: {RRENERGY:24027:a}  concentration: {RR CONCENTRATION:24029:a}  appetite: {RRAPPETITE:24044:a}  psychomotor: {RRPSYCHOMOTOR:24047:a}  motivation: {RRMOTIVATION:24051:a}  suicidality: {RRENDORSES/DENIES:24054:a}{RRSUICIDALITY:24055:a}  homicidality: {RRENDORSES/DENIES:24054:a}  irritability: DENIES    Anxiety Symptoms:  Baseline anxiety: {RRBASELINEANXIETY:24061:a}  Rumination, excessive worry: {RRRUMINATION/EXCESSIVE WORRY:24064:a}  Obsessions, compulsions: {RRobsessions/compulsions:24188}  Panic attacks: {RRPANICATTACKS:24190}  Nightmares, flashbacks, avoidance: {RRPTSDSYMPTOMS:24193}  Psychotic Symptoms: {RRPSYCHOTICSYMPTOMS:24201}    Substance Abuse:   ETOH: {RRALCOHOLUSE:24195}Used to drink socially but no longer drinks   Illicit: {RRILLICTDRUGUSE:24199}  Licit: {RRLICITDRUGUSE:24200}    Cognitive Symptoms:   ADLs:  {RRADLS:24203}  IADLs: {rr:24205}  Memory/recall: {RRMEMORY/RECALL:24206}  Concentration/task completion: {RRCONCENTRATION/TASKCOMPLETION:24208}    Psychiatric History:    ***  Prior psychiatric diagnoses:Pt reports depression, anxiety and PTSD.   Psychiatric hospitalizations: Reports being hospitalized in May-June 2024 for SI at a 21-day PHP program w/ [facility], --pt told her mom about her SI and voluntary admitted to an intensive group therapy program, where she was able to sort out her stress with work and a pregnancy loss. Pt recalls psych ED visit and was under observation for 2-3 days during college as she could not stop crying from stress with marching band practice & failing her classes.   Suicide attempts / Non-suicidal self-injury: Prior suicide attempts, at least twice by overdosing on insulin when she was in HS and freshman at college by   Current psychotropic medications:  - mirtazapine 15 mg nightly  -- Lexapro 10 mg daily  Medication trials: none noted  Current psychiatrist: None   Current therapist: Follows with transplant psychologist Dr. Artemio Aly since 06/2022  Other treatments: none noted    Medical/Surgical History:  {GSC Past Medical History:30421616}    {GSC Past Surgical History:30421619}    Social History:  Social History     Socioeconomic History    Marital status: Single    Number of children: 1    Highest education level: Some college, no degree   Occupational History    Occupation: patient registration   Tobacco Use    Smoking status: Never    Smokeless tobacco: Never   Vaping Use    Vaping status: Never Used   Substance and Sexual Activity    Alcohol use: Never    Drug use: Never    Sexual activity: Not Currently     Partners: Male     Social Drivers of Health Financial Resource Strain: Low Risk  (04/07/2023)    Overall Financial Resource Strain (CARDIA)     Difficulty of Paying Living Expenses: Not hard at all   Food Insecurity: No Food Insecurity (04/07/2023)    Hunger Vital Sign     Worried About Running Out of Food in the Last Year: Never true     Ran Out of Food in the Last Year: Never true   Transportation Needs: No Transportation Needs (04/07/2023)    PRAPARE - Therapist, art (Medical): No     Lack of Transportation (Non-Medical): No   Physical Activity: Insufficiently Active (04/20/2019)    Exercise Vital Sign     Days of Exercise per Week: 2 days     Minutes of Exercise per Session: 30 min   Stress: No Stress Concern Present (04/20/2019)    [employer] of Occupational Health - Occupational Stress Questionnaire     Feeling of Stress : Only a little   Social Connections: Unknown (04/20/2019)    Social Connection and Isolation Panel [NHANES]     Frequency of Communication with Friends and Family: Patient declined     Frequency of Social Gatherings with Friends and Family: Patient declined     Attends Religious Services: Patient declined     Database administrator or Organizations: Patient declined     Attends Banker Meetings: Patient declined     Marital Status: Patient declined   Housing:  Low Risk  (04/07/2023)    Housing     Within the past 12 months, have you ever stayed: outside, in a car, in a tent, in an overnight shelter, or temporarily in someone else's home (i.e. couch-surfing)?: No     Are you worried about losing your housing?: No       Family History:  {GSC Family History:30421602}    Pt suspects a dx of anxiety on her mother's side, but no official dx for any family members with exception of her cousin who is an NP and goes to therapy frequently. Denies family member who has died by suicide or has psychiatric hospitalization. Pt's father is an alcoholic, but father is not active in her life.     Developmental / environmental History:    ***    Patient lives with her son a 2-bedroom apartment in [place]. Pt's mother live 5-10 minutes nearby.   Highest level of education: Completed 3 years at [employer], unable to graduate as was frequently missing class. Currently finishing her degree at [employer] (one semester out) -- online classes.   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   Legal history: Denies any legal hx.     Identification of strengths, needs and risks in the areas gathered in the history above:    ***    Objective:     Vitals:   There were no vitals filed for this visit.    Mental Status Exam:  Appearance:    {PSY Appearance:23008}   Motor:   {PSY Motor:23010}   Speech/Language:    {PSY Speech/Lang:23011}   Mood:   {PSY Mood:23012}   Affect:   {PSY Affect:23013}   Thought process:   {PSY Thought Process:23015}   Thought content:     {PSY Thought Content:23016}   Perceptual disturbances:     {PSY Perceptual Disturbances:23017}     Orientation:   {PSY Orientation:23018}   Attention:   {PSY Attention:23019}   Concentration:   {PSY Concentration:23020}   Memory:   {PSY Memory:23021}    Fund of knowledge:    {PSY Fund of Knowledge:23022}   Insight:     {PSY Insight:23023}   Judgment:    {PSY Judgment:23024}   Impulse Control:   {PSY Impulse Control:23555}       I personally spent *** minutes face-to-face and non-face-to-face in the care of this patient, which includes all pre, intra, and post visit time on the date of service.  All documented time was specific to the E/M visit and does not include any procedures that may have been performed.      Vickki Hearing, MD

## 2023-04-28 DIAGNOSIS — Z940 Kidney transplant status: Principal | ICD-10-CM

## 2023-04-29 ENCOUNTER — Emergency Department: Admit: 2023-04-29 | Discharge: 2023-04-30 | Disposition: A | Discharge status: Home / Self Care | Payer: MEDICARE

## 2023-04-29 DIAGNOSIS — N1832 Anemia in stage 3b chronic kidney disease (CMS-HCC): Principal | ICD-10-CM

## 2023-04-29 DIAGNOSIS — Z940 Kidney transplant status: Principal | ICD-10-CM

## 2023-04-29 DIAGNOSIS — Z1159 Encounter for screening for other viral diseases: Principal | ICD-10-CM

## 2023-04-29 DIAGNOSIS — Z79899 Other long term (current) drug therapy: Principal | ICD-10-CM

## 2023-04-29 DIAGNOSIS — D708 Other neutropenia: Principal | ICD-10-CM

## 2023-04-29 DIAGNOSIS — D631 Anemia in chronic kidney disease: Principal | ICD-10-CM

## 2023-04-29 LAB — SLIDE REVIEW

## 2023-04-29 LAB — CBC W/ AUTO DIFF
BASOPHILS ABSOLUTE COUNT: 0 10*9/L (ref 0.0–0.1)
BASOPHILS RELATIVE PERCENT: 0.8 %
EOSINOPHILS ABSOLUTE COUNT: 0.2 10*9/L (ref 0.0–0.5)
EOSINOPHILS RELATIVE PERCENT: 4.5 %
HEMATOCRIT: 22.3 % — ABNORMAL LOW (ref 34.0–44.0)
HEMATOCRIT: 22.8 % — ABNORMAL LOW (ref 34.0–44.0)
HEMOGLOBIN: 7.5 g/dL — ABNORMAL LOW (ref 11.3–14.9)
HEMOGLOBIN: 7.6 g/dL — ABNORMAL LOW (ref 11.3–14.9)
LYMPHOCYTES ABSOLUTE COUNT: 0.4 10*9/L — ABNORMAL LOW (ref 1.1–3.6)
LYMPHOCYTES RELATIVE PERCENT: 12.2 %
MEAN CORPUSCULAR HEMOGLOBIN CONC: 33.5 g/dL (ref 32.0–36.0)
MEAN CORPUSCULAR HEMOGLOBIN CONC: 33.4 g/dL (ref 32.0–36.0)
MEAN CORPUSCULAR HEMOGLOBIN: 35 pg — ABNORMAL HIGH (ref 25.9–32.4)
MEAN CORPUSCULAR HEMOGLOBIN: 34.8 pg — ABNORMAL HIGH (ref 25.9–32.4)
MEAN CORPUSCULAR VOLUME: 104.4 fL — ABNORMAL HIGH (ref 77.6–95.7)
MEAN CORPUSCULAR VOLUME: 104.2 fL — ABNORMAL HIGH (ref 77.6–95.7)
MEAN PLATELET VOLUME: 8.5 fL (ref 6.8–10.7)
MONOCYTES ABSOLUTE COUNT: 0.7 10*9/L (ref 0.3–0.8)
MONOCYTES RELATIVE PERCENT: 18.8 %
NEUTROPHILS ABSOLUTE COUNT: 2.3 10*9/L (ref 1.8–7.8)
NEUTROPHILS RELATIVE PERCENT: 63.7 %
PLATELET COUNT: 209 10*9/L (ref 150–450)
RED BLOOD CELL COUNT: 2.13 10*12/L — ABNORMAL LOW (ref 3.95–5.13)
RED BLOOD CELL COUNT: 2.18 10*12/L — ABNORMAL LOW (ref 3.95–5.13)
RED CELL DISTRIBUTION WIDTH: 23.4 % — ABNORMAL HIGH (ref 12.2–15.2)
RED CELL DISTRIBUTION WIDTH: 23.4 % — ABNORMAL HIGH (ref 12.2–15.2)
WBC ADJUSTED: 3.7 10*9/L (ref 3.6–11.2)
WBC ADJUSTED: 4.9 10*9/L (ref 3.6–11.2)

## 2023-04-29 LAB — BASIC METABOLIC PANEL
ANION GAP: 10 mmol/L (ref 5–14)
BLOOD UREA NITROGEN: 32 mg/dL — ABNORMAL HIGH (ref 9–23)
BUN / CREAT RATIO: 13
CALCIUM: 9.3 mg/dL (ref 8.7–10.4)
CHLORIDE: 117 mmol/L — ABNORMAL HIGH (ref 98–107)
CO2: 16.6 mmol/L — ABNORMAL LOW (ref 20.0–31.0)
CREATININE: 2.48 mg/dL — ABNORMAL HIGH (ref 0.55–1.02)
EGFR CKD-EPI (2021) FEMALE: 25 mL/min/{1.73_m2} — ABNORMAL LOW (ref >=60)
GLUCOSE RANDOM: 86 mg/dL (ref 70–99)
POTASSIUM: 4.8 mmol/L (ref 3.4–4.8)
SODIUM: 144 mmol/L (ref 135–145)

## 2023-04-29 MED ORDER — SODIUM BICARBONATE 650 MG TABLET
ORAL_TABLET | Freq: Two times a day (BID) | ORAL | 3 refills | 90.00 days | Status: CP
Start: 2023-04-29 — End: 2024-04-28
  Filled 2023-06-06: qty 200, 50d supply, fill #0

## 2023-04-29 MED ORDER — MYCOPHENOLATE SODIUM 180 MG TABLET,DELAYED RELEASE
ORAL_TABLET | Freq: Two times a day (BID) | ORAL | 3 refills | 90.00 days | Status: CP
Start: 2023-04-29 — End: 2024-04-28
  Filled 2023-05-08: qty 120, 30d supply, fill #0

## 2023-04-29 NOTE — Unmapped (Signed)
 Vaginal bleeding that started on Monday.

## 2023-04-29 NOTE — Unmapped (Signed)
 Reviewed recent labs with Dr. Carlene Coria, pt advised to start myfortic 360 mg bid.  Will send script to [facility] and they will call pt to arrange delivery.  Pt reports she is currently on birth control since leaving the hospital and plans to stay on it.      Pt also reports worsening vaginal bleeding and she is soaking through 2 pads the last hour.  She denies any dizziness and is currently at work.  Pt advised to check in with GYN but that she should go to the ED for evaluation.  Hgb on labs today was 7.5.    Pt reports she is taking the sodium bicarbonate 1300 mg bid but that her diarrhea has also returned.  She is taking imodium PRN    Pt agreeable to plan of ED for vaginal bleeding.

## 2023-04-29 NOTE — Unmapped (Addendum)
 Pt reports she was here a few weeks ago for vaginal bleeding, unknown cause. Pt had to receive blood. Reports bleeding returned and she is having clots. Reports bloating and diarrhea. Denies N/V/pain. Reports HgB today is 7.5, last time this happened pts Hgb dropped to 4.

## 2023-04-30 LAB — TACROLIMUS LEVEL: TACROLIMUS BLOOD: 4.5 ng/mL

## 2023-04-30 LAB — BASIC METABOLIC PANEL
ANION GAP: 12 mmol/L (ref 5–14)
BLOOD UREA NITROGEN: 33 mg/dL — ABNORMAL HIGH (ref 9–23)
BUN / CREAT RATIO: 13
CALCIUM: 9.2 mg/dL (ref 8.7–10.4)
CHLORIDE: 116 mmol/L — ABNORMAL HIGH (ref 98–107)
CO2: 17 mmol/L — ABNORMAL LOW (ref 20.0–31.0)
CREATININE: 2.51 mg/dL — ABNORMAL HIGH (ref 0.55–1.02)
EGFR CKD-EPI (2021) FEMALE: 25 mL/min/{1.73_m2} — ABNORMAL LOW (ref >=60)
GLUCOSE RANDOM: 92 mg/dL (ref 70–179)
POTASSIUM: 5 mmol/L — ABNORMAL HIGH (ref 3.4–4.8)
SODIUM: 145 mmol/L (ref 135–145)

## 2023-04-30 LAB — CBC W/ AUTO DIFF
BASOPHILS ABSOLUTE COUNT: 0.1 10*9/L (ref 0.0–0.1)
BASOPHILS RELATIVE PERCENT: 2.2 %
EOSINOPHILS ABSOLUTE COUNT: 0.3 10*9/L (ref 0.0–0.5)
EOSINOPHILS RELATIVE PERCENT: 5.3 %
HEMATOCRIT: 22.8 % — ABNORMAL LOW (ref 34.0–44.0)
HEMOGLOBIN: 7.6 g/dL — ABNORMAL LOW (ref 11.3–14.9)
LYMPHOCYTES ABSOLUTE COUNT: 0.7 10*9/L — ABNORMAL LOW (ref 1.1–3.6)
LYMPHOCYTES RELATIVE PERCENT: 14.3 %
MEAN CORPUSCULAR HEMOGLOBIN CONC: 33.4 g/dL (ref 32.0–36.0)
MEAN CORPUSCULAR HEMOGLOBIN: 34.8 pg — ABNORMAL HIGH (ref 25.9–32.4)
MEAN CORPUSCULAR VOLUME: 104.2 fL — ABNORMAL HIGH (ref 77.6–95.7)
MONOCYTES ABSOLUTE COUNT: 0.8 10*9/L (ref 0.3–0.8)
MONOCYTES RELATIVE PERCENT: 16.5 %
NEUTROPHILS ABSOLUTE COUNT: 3 10*9/L (ref 1.8–7.8)
NEUTROPHILS RELATIVE PERCENT: 61.7 %
PLATELET COUNT: >206 10*9/L (ref 150–450)
RED BLOOD CELL COUNT: 2.18 10*12/L — ABNORMAL LOW (ref 3.95–5.13)
RED CELL DISTRIBUTION WIDTH: 23.4 % — ABNORMAL HIGH (ref 12.2–15.2)
WBC ADJUSTED: 4.9 10*9/L (ref 3.6–11.2)

## 2023-04-30 LAB — SLIDE REVIEW

## 2023-04-30 LAB — APTT
APTT: 34.2 s (ref 24.8–38.4)
HEPARIN CORRELATION: 0.2

## 2023-04-30 LAB — PROTIME-INR
INR: 1.19
PROTIME: 13.6 s — ABNORMAL HIGH (ref 9.9–12.6)

## 2023-04-30 LAB — HCG QUANTITATIVE, BLOOD: GONADOTROPIN, CHORIONIC (HCG) QUANT: <2.6 m[IU]/mL

## 2023-04-30 MED ORDER — METRONIDAZOLE 0.75 % (37.5 MG/5 GRAM) VAGINAL GEL
Freq: Two times a day (BID) | VAGINAL | 0 refills | 7.00 days | Status: CP
Start: 2023-04-30 — End: 2023-05-07

## 2023-04-30 NOTE — Unmapped (Signed)
 [facility] Specialty Medication Onboarding    Specialty Medication: Mycophenolate 180mg  EC tablet  Prior Authorization: Not Required   Financial Assistance: No - copay  <$25  Final Copay/Day Supply: $0 / 30 days    Insurance Restrictions: Yes - max 1 month supply     Notes to Pharmacist: LF 10/19/2020 for 30ds at [facility]  Credit Card on File: no  Start Date on Rx:  N/A    The triage team has completed the benefits investigation and has determined that the patient is able to fill this medication at [facility]. Please contact the patient to complete the onboarding or follow up with the prescribing physician as needed.

## 2023-04-30 NOTE — Unmapped (Signed)
 Vaginitis Molecular Panel  Order: [id number]   Status: Final result    Test Result Released: Yes (not seen)    Specimen Information: Clinician-collected Vaginal Swab     Component  Ref Range & Units (hover)   Bacterial Vaginitis Positive Abnormal   Candida group NAAT Not Detected  Candida glabrata/Candida krusei NAAT Not Detected  Trichomonas NAAT Not Detected      Final result reviewed and sent to [facility], Dr. Matilde Haymaker for further action, if indicated, by resource nurse team.

## 2023-04-30 NOTE — Unmapped (Signed)
 EMAP test result follow-up note    April 30, 2023 4:04 PM     I was contacted by the ED resource nurse with regard to Elaine Koch. She was seen in the [facility] Emergency Department on 04/30/23 at which time a vaginitis panel was done which has returned positive for:    Bacterial Vaginitis Positive   Candida group NAAT Not Detected   Candida glabrata/Candida krusei NAAT Not Detected   TRICHOMONAS NAAT Not Detected         I have reviewed the patient's chart from this ED visit. Briefly, this is a 38 yo F h/o DM1, ESRD, pancreatic and kidney transplant in 2021, presenting with vaginal bleeding. Swab has come back showing this + BV.    I called the patient to review these results with her. We discussed that BV does not need to be treated if asymptomatic unless patient is pregnant and/or undergoing gynecologic surge.  She is not currently having any vaginal irritation or discharge, but is still having vaginal bleeding. She would prefer to wait until the bleeding subsides to see if she notices any symptoms from the BV. I electronically prescribed metronidazole gel which patient can take as needed. Patient comfortable with plan.    Sherrie Sport, MD

## 2023-04-30 NOTE — Unmapped (Signed)
 3/26: patient is aware of mycophenolate handling precautions:  This drug is considered hazardous and should be handled as little as possible.  Wash hands before and after touching pills. If someone else helps with medication administration, they should wear gloves.    The following medication is onboarded in this note:  Mycophenolate $0/30ds part b    Psychologist, prison and probation services and Home Delivery Pharmacy    Patient Onboarding/Medication Counseling    Elaine Koch is a 38 y.o. female with kidney pancreas transplant who I am counseling today on  continuation   of therapy.  I am speaking to the patient.    Was a Nurse, learning disability used for this call? No    Verified patient's date of birth / HIPAA.    Specialty medication(s) to be sent: n/a      Non-specialty medications/supplies to be sent: n/a      Medications not needed at this time: n/a         The patient declined counseling on medication administration, missed dose instructions, goals of therapy, side effects and monitoring parameters, warnings and precautions, drug/food interactions, and storage, handling precautions, and disposal because they have taken the medication previously. The information in the declined sections below are for informational purposes only and was not discussed with patient.   Myfortic (mycophenolic acid)    Medication & Administration     Dosage:   Take 2 tablets (360mg  total) by mouth twice daily    Administration:   Take with or without food, although taking with food helps minimize GI side effects.  Swallow the pills whole, do not chew or crush    Adherence/Missed dose instructions:  Take a missed dose as soon as you think about it.  If it is less than 2 hours until your next dose, skip the missed dose and go back to your normal time.  Do not take 2 doses at the same time or extra doses.    Goals of Therapy     To prevent organ rejection    Side Effects & Monitoring Parameters     Common side effects  Back or joint pain  Constipation  Headache/dizziness  Not hungry  Stomach pain, diarrhea, constipation, gas, upset stomach, vomiting, nausea  Feeling tired or weak  Shakiness  Trouble sleeping  Increased risk of infection    The following side effects should be reported to the provider:  Allergic reaction  High blood sugar (confusion, feeling sleepy, more thirst, more hungry, passing urine more often, flushing, fast breathing, or breath that smells like fruit)  Electrolyte issues (mood changes, confusion, muscle pain or weakness, a heartbeat that does not feel normal, seizures, not hungry, or very bad upset stomach or throwing up)  High or low blood pressure (bad headache or dizziness, passing out, or change in eyesight)  Kidney issues (unable to pass urine, change in how much urine is passed, blood in the urine, or a big weight gain)  Skin (oozing, heat, swelling, redness, or pain), UTI and other infections   Chest pain or pressure  Abnormal heartbeat  Unexplained bleeding or bruising  Abnormal burning, numbness, or tingling  Muscle cramps,  Yellowing of skin or eyes    Monitoring parameters  Pregnancy test initially prior to treatment and 8-10 days later then as needed)  CBC weekly for first month then twice monthly for next 2 months, then monthly)  Monitor Renal and liver functions  Signs of organ rejection    Contraindications, Warnings, & Precautions     *  This is a REMS drug and an FDA-approved patient medication guide will be printed with each dispensation  Black Box Warning: Infections   Black Box Warning: Lymphoproliferative disorders - risk of development of lymphoma and skin malignancy is increased  Black Box Warning: Use during pregnancy is associated with increased risks of first trimester pregnancy loss and congenital malformations.   Black Box Warning: Females of reproductive potential should use contraception during treatment and for 6 weeks after therapy is discontinued  Is patient using an effective method of contraception? Yes  If yes, method of contraception:  per patient on our 3/26 phone call, she has an IUD  CNS depression  New or reactivated viral infections  Neutropenia  Female patients and/or their female partners should use effective contraception during treatment of the female patient and for at least 3 months after last dose.  Breastfeeding is not recommended during therapy and for 6 weeks after last dose    Drug/Food Interactions     Medication list reviewed in Epic. The patient was instructed to inform the care team before taking any new medications or supplements.  No interactions noted that clinic is not already monitoring .   Do not take Echinacea while on this medication  Check with your doctor before getting any vaccinations (live or inactivated)    Storage, Handling Precautions, & Disposal     Store at room temperature  Keep away from children and pets  This drug is considered hazardous and should be handled as little as possible.  Wash hands before and after touching pills. If someone else helps with medication administration, they should wear gloves.      Current Medications (including OTC/herbals), Comorbidities and Allergies     Current Outpatient Medications   Medication Sig Dispense Refill    acetaminophen (TYLENOL) 325 MG tablet Take 2 tablets (650 mg total) by mouth every four (4) hours as needed for pain. 100 tablet 2    butalbital-acetaminophen-caffeine (ESGIC) 50-325-40 mg per tablet Take 1 tablet by mouth every four (4) hours as needed for pain. 30 tablet 2    escitalopram oxalate (LEXAPRO) 5 MG tablet Take 2 tablets (10 mg total) by mouth daily. 60 tablet 11    folic acid (FOLVITE) 1 MG tablet Take 1 tablet (1 mg total) by mouth daily. 30 tablet 11    hydrocortisone 2.5 % cream Apply topically two (2) times a day. 30 g 0    loperamide (IMODIUM A-D) 2 mg tablet Take 1 tablet (2 mg total) by mouth four (4) times a day as needed for diarrhea. 60 tablet 2    medroxyPROGESTERone (PROVERA) 10 MG tablet Continue medroxyprogesterone taper as below:    + Week 1 (2/24-3/3): medroxyprogesterone 20 mg three times daily for 7 days (you received 1 dose in the hospital, so you will have an extra day here  + Week 2 (3/4-3/10): medroxyprogesterone 20 mg twice daily for 7 days  + Week (3/11- ): medroxyprogesterone 20 mg daily until outpatient clinic follow-up 130 tablet 1    methocarbamol (ROBAXIN) 500 MG tablet Take 1 tablet (500 mg total) by mouth every eight (8) hours as needed (muscle pain). 90 tablet 0    metroNIDAZOLE (FLAGYL) 500 MG tablet Take 1 tablet (500 mg total) by mouth two (2) times a day. 7 tablet 0    metroNIDAZOLE (METROGEL) 0.75 % (37.5mg /5 gram) vaginal gel Insert into the vagina 3 (three) times a week. 70 g 6    mirtazapine (REMERON) 15 MG tablet  Take 1 tablet (15 mg total) by mouth nightly. 30 tablet 2    mycophenolate (MYFORTIC) 180 MG EC tablet Take 2 tablets (360 mg total) by mouth two (2) times a day. 360 tablet 3    sodium bicarbonate 650 mg tablet Take 2 tablets (1,300 mg total) by mouth two (2) times a day. 400 tablet 3    tacrolimus (ENVARSUS XR) 1 mg Tb24 extended release tablet Take 3 tablets (3 mg total) by mouth daily. Take 3 tablets (3 mg total) by mouth daily with FOUR 4mg  tablets for total daily dose of 19mg  90 tablet 0    tacrolimus (ENVARSUS XR) 4 mg Tb24 extended release tablet Take 4 tablets (16 mg total) by mouth daily. Take with three 1 mg tablets for a total of 19 mg 120 tablet 11     No current facility-administered medications for this visit.       Allergies   Allergen Reactions    Iodinated Contrast Media Swelling, Rash and Other (See Comments)     Burning, warmth throughout body and tingling.  Throat swelling.  Treated with benadryl and symptoms improved.  Has not had contrast since then (2013 or 2014).    Burning    Nickel Rash    Privigen [Immun Glob G(Igg)-Pro-Iga 0-50] Muscle Pain     PRIVIGEN brand: back and leg pain at usual rate of infusion during first dose, then full body pain again when rechallenged at slower rate of 30 mL/hr    Propranolol Other (See Comments) and Swelling    Eye Irrigating Solution [Ophthalmic Irrigation Solution] Other (See Comments)     Contrast dye (name?) used in eyes caused hot feeling in face, reversed with Benadryl.     Iodine      Other reaction(s): Skin Rash    Naltrexone Rash    Uni-Cortrom Rash       Patient Active Problem List   Diagnosis    Anemia in stage 3b chronic kidney disease (CMS-HCC)    History of diabetes mellitus, type I    Sickle cell trait (CMS-HCC)    Other secondary hypertension    History of blood transfusion    Cardiomyopathy (CMS-HCC)    Kidney replaced by transplant    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    AKI (acute kidney injury) (CMS-HCC)    Previous cesarean delivery affecting pregnancy    Other neutropenia (CMS-HCC)    History of simultaneous kidney and pancreas transplant (CMS-HCC)    BV (bacterial vaginosis)    Chronic diarrhea of unknown origin    Worsening renal function    Anxiety and depression    Chest pain    History of migraine    UTI (urinary tract infection)    Major depressive disorder, 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)    Anemia    Vaginal bleeding    Acute kidney injury superimposed on chronic kidney disease (CMS-HCC)    Recurrent Clostridioides difficile diarrhea    Immunosuppressed status (CMS-HCC)    Pancytopenia (CMS-HCC)    Elevated liver enzymes    Depression    Hyperkalemia    Metabolic acidosis       Medication list has been reviewed and updated in Epic: Yes    Allergies have been reviewed and updated in Epic: Yes    Appropriateness of Therapy     Acute infections noted within Epic:  No active infections  Patient reported infection: None    Is the medication and dose appropriate based  on diagnosis, medication list, comorbidities, allergies, medical history, patient???s ability to self-administer the medication, and therapeutic goals? Yes    Prescription has been clinically reviewed: Yes      Baseline Quality of Life Assessment      How many days over the past month did your transplant  keep you from your normal activities? For example, brushing your teeth or getting up in the morning. 0    Financial Information     Medication Assistance provided: None Required    Anticipated copay of $0/30ds part b reviewed with patient. Verified delivery address.    Delivery Information     Scheduled delivery date: n/a- see delivery info on clinical note    Expected start date: patient last filled at outside pharmacy, currently taking      Medication will be delivered via n/a to the  n/a  address in [place].  This shipment will require a signature.      Explained the services we provide at [facility] and that each month we would call to set up refills.  Stressed importance of returning phone calls so that we could ensure they receive their medications in time each month.  Informed patient that we should be setting up refills 7-10 days prior to when they will run out of medication.  A pharmacist will reach out to perform a clinical assessment periodically.  Informed patient that a welcome packet, containing information about our pharmacy and other support services, a Notice of Privacy Practices, and a drug information handout will be sent.      The patient or caregiver noted above participated in the development of this care plan and knows that they can request review of or adjustments to the care plan at any time.      Patient or caregiver verbalized understanding of the above information as well as how to contact the pharmacy at [phone] option 4 with any questions/concerns.  The pharmacy is open Monday through Friday 8:30am-4:30pm.  A pharmacist is available 24/7 via pager to answer any clinical questions they may have.    Patient Specific Needs     Does the patient have any physical, cognitive, or cultural barriers? No    Does the patient have adequate living arrangements? (i.e. the ability to store and take their medication appropriately) Yes    Did you identify any home environmental safety or security hazards? No    Patient prefers to have medications discussed with  Patient     Is the patient or caregiver able to read and understand education materials at a high school level or above? Yes    Patient's primary language is  English     Is the patient high risk? Yes, patient is taking a REMS drug. Medication is dispensed in compliance with REMS program    Does the patient have an additional or emergency contact listed in their chart? Yes    SOCIAL DETERMINANTS OF HEALTH     At the [facility], we have learned that life circumstances - like trouble affording food, housing, utilities, or transportation can affect the health of many of our patients.   That is why we wanted to ask: are you currently experiencing any life circumstances that are negatively impacting your health and/or quality of life? Patient declined to answer    Social Drivers of Health     Food Insecurity: No Food Insecurity (04/07/2023)    Hunger Vital Sign     Worried  About Running Out of Food in the Last Year: Never true     Ran Out of Food in the Last Year: Never true   Tobacco Use: Low Risk  (04/29/2023)    Patient History     Smoking Tobacco Use: Never     Smokeless Tobacco Use: Never     Passive Exposure: Not on file   Transportation Needs: No Transportation Needs (04/07/2023)    PRAPARE - Transportation     Lack of Transportation (Medical): No     Lack of Transportation (Non-Medical): No   Alcohol Use: Not At Risk (04/24/2023)    Alcohol Use     How often do you have a drink containing alcohol?: Never     How many drinks containing alcohol do you have on a typical day when you are drinking?: Not on file     How often do you have 5 or more drinks on one occasion?: Never   Housing: Low Risk  (04/07/2023)    Housing     Within the past 12 months, have you ever stayed: outside, in a car, in a tent, in an overnight shelter, or temporarily in someone else's home (i.e. couch-surfing)?: No     Are you worried about losing your housing?: No   Physical Activity: Insufficiently Active (04/20/2019)    Exercise Vital Sign     Days of Exercise per Week: 2 days     Minutes of Exercise per Session: 30 min   Utilities: Low Risk  (04/07/2023)    Utilities     Within the past 12 months, have you been unable to get utilities (heat, electricity) when it was really needed?: No   Stress: No Stress Concern Present (04/20/2019)    [employer] of Occupational Health - Occupational Stress Questionnaire     Feeling of Stress : Only a little   Interpersonal Safety: Not At Risk (04/24/2023)    Interpersonal Safety     Unsafe Where You Currently Live: No     Physically Hurt by Anyone: No     Abused by Anyone: No   Substance Use: Not on file (12/16/2022)   Intimate Partner Violence: Not At Risk (04/24/2023)    Humiliation, Afraid, Rape, and Kick questionnaire     Fear of Current or Ex-Partner: No     Emotionally Abused: No     Physically Abused: No     Sexually Abused: No   Social Connections: Unknown (04/20/2019)    Social Connection and Isolation Panel [NHANES]     Frequency of Communication with Friends and Family: Patient declined     Frequency of Social Gatherings with Friends and Family: Patient declined     Attends Religious Services: Patient declined     Database administrator or Organizations: Patient declined     Attends Banker Meetings: Patient declined     Marital Status: Patient declined   Physicist, medical Strain: Low Risk  (04/07/2023)    Overall Financial Resource Strain (CARDIA)     Difficulty of Paying Living Expenses: Not hard at all   Depression: At risk (04/24/2023)    PHQ-2     PHQ-2 Score: 3   Internet Connectivity: Not on file   Health Literacy: Low Risk  (04/24/2023)    Health Literacy     : Never       Would you be willing to receive help with any of the needs that you have identified today? Not applicable  Thad Ranger, PharmD  [facility] Specialty Pharmacist

## 2023-04-30 NOTE — Unmapped (Signed)
 Called for advice from ED.     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 During this admission, she was seen by the gynecology team. 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 She was started on a Provera taper during that time, with good effect (Provera 20 mg TID x7 days > Provera 20 mg BID x7 days > Provera 20 mg daily until seen in clinic). Patient decreased from 20 BID to 20mg  daily on Monday, 3/17.     Patient coming in today with increase in vaginal bleeding in the setting of the decrease from 20mg  BID > 20mg  daily. She reports passing clots as well for the last several days.    She is hemodynamically stable, with most recent BP 146/80 and HR 76.   Over the course of 12 hours, her Hgb was 7.5 > 7.6 (8.0 on discharge on 2/27).     She has follow up scheduled for ObGyn management on 05/01/23 with our office to discuss long-term management of her AUB.     Discussed with ED provider that given clinical and laboratory stability as well as previously scheduled close follow up, comfortable with discharge and plan for outpatient management. Patient may continue to take her BID dose tomorrow until her appointment on 3/20 for further discussion of management. ED provider voiced agreement with plan, recommend review return precautions as well for lightheadedness, dizziness, saturating through >2 pads per hour.     Will forward this note to [facility], CNM, who will be seeing this patient in clinic.     I did not see this patient or perform a formal consult.     Velora Mediate, MD   Resident Physician, PGY-2  [facility]   Department of Obstetrics and Gynecology

## 2023-04-30 NOTE — Unmapped (Signed)
 [facility]  Emergency Department Provider Note     ED Clinical Impression     Final diagnoses:   Vaginal bleeding (Primary)      Impression, Medical Decision Making, ED Course     Impression: 38 y.o. female with PMH most significant for type 1 diabetes, ESRD, pancreatic and kidney transplant in 2021, migraines, anemia, depression, and anxiety who presents with recurrent abnormal uterine bleeding as described below.    DDx/MDM: Abnormal uterine bleeding, possibly secondary to fibroids    Based upon the timing of symptom onset in the setting of Provera, suspect uterine bleeding secondary to decreased dose.  Discussed the case with OB/GYN as a curbside consult and received recommendations to transition patient back to Provera twice daily.  The patient is being seen by OB/GYN on March 20, and has been strongly advised to make sure to come to the appointment.    Diagnostic workup as below. Will treat patient with transition from Provera daily to Provera twice a day per OB/GYN recommendations.    Orders Placed This Encounter   Procedures    Vaginitis Molecular Panel    Chlamydia/Gonorrhoeae NAA    CBC w/ Differential    Basic Metabolic Panel    PTT    PT-INR    hCG QUANTitative, Blood    Type and Screen with Confirmation ABORh            MDM Elements  Discussion of Management with other Physicians, QHP or Appropriate Source: Consultant - OB/GYN  I have reviewed recent and relevant previous record, including: Inpatient notes - documentation from prior hospitalization for anemia in the setting of abnormal uterine bleeding  ____________________________________________    The case was discussed with the attending physician, who is in agreement with the above assessment and plan.      History     Chief Complaint  Chief Complaint   Patient presents with    Vaginal Bleeding       HPI   Elaine Koch is a 38 y.o. female with past medical history as below who presents with recurrent vaginal bleeding since Monday.  She reports having bleeding along with passing of clots, similar to the bleeding which led to her prior hospitalization for a low hemoglobin.  She reports on Tuesday she bled through 2 pads during the day.  She denies any dizziness or lightheadedness.  She denies any abdominal pain or cramping with passing of blood and clots.    Of note, she was tapered from Provera twice a day to Provera once a day on Monday.  This Provera taper was initiated as part of her management for her uterine bleeding during her hospitalization.  She has an OB/GYN appointment scheduled for March 20.    Outside Historian(s): I have obtained additional history/collateral from none.    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       Past Surgical History:   Procedure Laterality Date    AV FISTULA PLACEMENT  2018    CESAREAN SECTION      NEPHRECTOMY TRANSPLANTED ORGAN  05/02/2020    PR COLONOSCOPY FLX DX W/COLLJ SPEC WHEN PFRMD N/A 02/14/2020  Procedure: COLONOSCOPY, FLEXIBLE, PROXIMAL TO SPLENIC FLEXURE; DIAGNOSTIC, W/WO COLLECTION SPECIMEN BY BRUSH OR WASH;  Surgeon: Carmon Ginsberg, MD;  Location: [facility]URES [facility];  Service: Gastroenterology    PR COLONOSCOPY FLX DX W/COLLJ SPEC WHEN PFRMD N/A 06/19/2020    Procedure: COLONOSCOPY, FLEXIBLE, PROXIMAL TO SPLENIC FLEXURE; DIAGNOSTIC, W/WO COLLECTION SPECIMEN BY BRUSH OR WASH;  Surgeon: Carmon Ginsberg, MD;  Location: [facility]URES [facility];  Service: Gastroenterology    PR FECAL MICROBIOTA PREP INSTIL N/A 02/14/2020    Procedure: PREP W INSTILLATION FECAL MICROBIOTA, ANY METHOD;  Surgeon: Carmon Ginsberg, MD;  Location: [facility]URES [facility]; Service: Gastroenterology    PR INDUCED ABORTN BY DIL/EVAC N/A 03/20/2021    Procedure: DILATION AND EVACUATION - INDUCED;  Surgeon: Gaynelle Cage, MD;  Location: [facility] OR [facility];  Service: Family Planning    PR LAP,DIAGNOSTIC ABDOMEN N/A 11/17/2020    Procedure: DIAGNOSTIC AND OR OPERATIVE LAPAROSCOPY;  Surgeon: Estil Daft, MD;  Location: MAIN OR [facility];  Service: Womens Primary Gynecology    PR PREPARE FECAL MICROBIOTA FOR INSTILLATION  06/19/2020    Procedure: PREP FECAL MICROBIOTA FOR INSTILLATION, INCLUDING ASSESSMENT OF DONOR SPECIMEN;  Surgeon: Carmon Ginsberg, MD;  Location: [facility];  Service: Gastroenterology    PR SURG RX MISSED ABORTN,1ST TRI N/A 11/17/2020    Procedure: VACUUM ASPIRATION;  Surgeon: Estil Daft, MD;  Location: MAIN OR [facility];  Service: [facility] Primary Gynecology    PR SURG RX MISSED ABORTN,1ST TRI N/A 11/19/2022    Procedure: TREATMENT OF MISSED ABORTION, COMPLETED SURGICALLY; FIRST TRIMESTER;  Surgeon: Asencion Partridge, MD;  Location: [facility] OR [facility];  Service: [facility] Primary Gynecology    PR TRANSPLANT ALLOGRAFT PANCREAS N/A 05/03/2019    Procedure: TRANSPLANTATION OF PANCREATIC ALLOGRAFT;  Surgeon: Leona Carry, MD;  Location: MA[facility] OR [facility];  Service: Transplant    PR TRANSPLANT,PREP CADAVER RENAL GRAFT N/A 05/03/2019    Procedure: [facility] STD PREP CAD DONR RENAL ALLOGFT PRIOR TO TRNSPLNT, INCL DISSEC/REM PERINEPH FAT, DIAPH/RTPER ATTAC;  Surgeon: Leona Carry, MD;  Location: MAIN OR [facility];  Service: Transplant    PR TRANSPLANT,PREP DONOR PANCREAS N/A 05/03/2019    Procedure: BACKBENCH STANDARD PREPARATION OF CADAVER DONOR PANCREAS ALLOGRAFT PRIOR TO TRANSPLANTATION;  Surgeon: Leona Carry, MD;  Location: MAIN OR [facility];  Service: Transplant    PR TRANSPLANTATION OF KIDNEY N/A 05/03/2019    Procedure: RENAL ALLOTRANSPLANTATION, IMPLANTATION OF GRAFT; WITHOUT RECIPIENT NEPHRECTOMY;  Surgeon: Leona Carry, MD;  Location: MAIN OR [facility];  Service: Transplant    TONSILLECTOMY         No current facility-administered medications for this encounter.    Current Outpatient Medications:     acetaminophen (TYLENOL) 325 MG tablet, Take 2 tablets (650 mg total) by mouth every four (4) hours as needed for pain., Disp: 100 tablet, Rfl: 2    butalbital-acetaminophen-caffeine (ESGIC) 50-325-40 mg per tablet, Take 1 tablet by mouth every four (4) hours as needed for pain., Disp: 30 tablet, Rfl: 2    escitalopram oxalate (LEXAPRO) 5 MG tablet, Take 2 tablets (10 mg total) by mouth daily., Disp: 60 tablet, Rfl: 11    folic acid (FOLVITE) 1 MG tablet, Take 1 tablet (1 mg total) by mouth daily., Disp: 30 tablet, Rfl: 11    hydrocortisone 2.5 % cream, Apply topically two (2) times a day., Disp: 30 g, Rfl: 0    loperamide (IMODIUM A-D) 2 mg tablet, Take 1 tablet (2  mg total) by mouth four (4) times a day as needed for diarrhea., Disp: 60 tablet, Rfl: 2    medroxyPROGESTERone (PROVERA) 10 MG tablet, Continue medroxyprogesterone taper as below:  + Week 1 (2/24-3/3): medroxyprogesterone 20 mg three times daily for 7 days (you received 1 dose in the hospital, so you will have an extra day here + Week 2 (3/4-3/10): medroxyprogesterone 20 mg twice daily for 7 days + Week (3/11- ): medroxyprogesterone 20 mg daily until outpatient clinic follow-up, Disp: 130 tablet, Rfl: 1    methocarbamol (ROBAXIN) 500 MG tablet, Take 1 tablet (500 mg total) by mouth every eight (8) hours as needed (muscle pain)., Disp: 90 tablet, Rfl: 0    metroNIDAZOLE (FLAGYL) 500 MG tablet, Take 1 tablet (500 mg total) by mouth two (2) times a day., Disp: 7 tablet, Rfl: 0    metroNIDAZOLE (METROGEL) 0.75 % (37.5mg /5 gram) vaginal gel, Insert into the vagina 3 (three) times a week., Disp: 70 g, Rfl: 6    mirtazapine (REMERON) 15 MG tablet, Take 1 tablet (15 mg total) by mouth nightly., Disp: 30 tablet, Rfl: 2    mycophenolate (MYFORTIC) 180 MG EC tablet, Take 2 tablets (360 mg total) by mouth two (2) times a day., Disp: 360 tablet, Rfl: 3    sodium bicarbonate 650 mg tablet, Take 2 tablets (1,300 mg total) by mouth two (2) times a day., Disp: 360 tablet, Rfl: 3    tacrolimus (ENVARSUS XR) 1 mg Tb24 extended release tablet, Take 3 tablets (3 mg total) by mouth daily. Take 3 tablets (3 mg total) by mouth daily with FOUR 4mg  tablets for total daily dose of 19mg , Disp: 90 tablet, Rfl: 0    tacrolimus (ENVARSUS XR) 4 mg Tb24 extended release tablet, Take 4 tablets (16 mg total) by mouth daily. Take with three 1 mg tablets for a total of 19 mg, Disp: 120 tablet, Rfl: 11    Allergies  Iodinated contrast media, Nickel, Privigen [immun glob g(igg)-pro-iga 0-50], Propranolol, Eye irrigating solution [ophthalmic irrigation solution], Iodine, Naltrexone, and Uni-cortrom    Family History  Family History   Problem Relation Age of Onset    Diabetes Mother     Diabetes Father     Cancer Maternal Grandmother     Diabetes Maternal Grandfather     Diabetes Paternal Grandmother        Social History  Social History     Tobacco Use    Smoking status: Never    Smokeless tobacco: Never   Vaping Use    Vaping status: Never Used   Substance Use Topics    Alcohol use: Never    Drug use: Never        Physical Exam     VITAL SIGNS:      Vitals:    04/29/23 1939 04/29/23 1945 04/30/23 0028 04/30/23 0359   BP:  143/78 146/80 145/70   Pulse: 88 76 76 75   Resp:  18 16 18    Temp:  36.8 ??C (98.2 ??F)  36.3 ??C (97.3 ??F)   TempSrc:  Oral  Oral   SpO2: 100% 100% 100% 100%   Weight:  68.7 kg (151 lb 7.3 oz)         Constitutional: Alert and oriented. No acute distress.  Eyes: Conjunctivae are normal.  HEENT: Normocephalic and atraumatic. Conjunctivae clear. No congestion. Moist mucous membranes.   Cardiovascular: Rate as above, regular rhythm.  Systolic murmur appreciated with auscultation.  Normal and symmetric distal pulses. Normal  skin turgor.  Respiratory: Normal respiratory effort. Breath sounds are normal. There are no wheezing or crackles heard.  Gastrointestinal: Soft, non-distended, non-tender.  Genitourinary: Speculum exam with chaperone demonstrates small pooling of blood with clearance after 3 swabs.  Musculoskeletal: Non-tender with normal range of motion in all extremities.  Neurologic: Normal speech and language. No gross focal neurologic deficits are appreciated. Patient is moving all extremities equally, face is symmetric at rest and with speech.  Skin: Skin is warm, dry and intact. No rash noted.  Psychiatric: Mood and affect are normal. Speech and behavior are normal.     Radiology     No orders to display       Pertinent labs & imaging results that were available during my care of the patient were independently interpreted by me and considered in my medical decision making (see chart for details).    Portions of this record have been created using Scientist, clinical (histocompatibility and immunogenetics). Dictation errors have been sought, but may not have been identified and corrected.    Jilda Panda, M.D.  PGY-1  Department of Neurology  Pager: [phone]       Jilda Panda, MD  Resident  04/30/23 0400

## 2023-05-01 ENCOUNTER — Ambulatory Visit: Admit: 2023-05-01 | Discharge: 2023-05-02 | Payer: MEDICARE

## 2023-05-01 DIAGNOSIS — N939 Abnormal uterine and vaginal bleeding, unspecified: Principal | ICD-10-CM

## 2023-05-01 LAB — PROLACTIN: PROLACTIN: 8.1 ng/mL

## 2023-05-01 LAB — CBC
HEMATOCRIT: 22.7 % — ABNORMAL LOW (ref 34.0–44.0)
HEMOGLOBIN: 7.4 g/dL — ABNORMAL LOW (ref 11.3–14.9)
MEAN CORPUSCULAR HEMOGLOBIN CONC: 32.7 g/dL (ref 32.0–36.0)
MEAN CORPUSCULAR HEMOGLOBIN: 34 pg — ABNORMAL HIGH (ref 25.9–32.4)
MEAN CORPUSCULAR VOLUME: 104 fL — ABNORMAL HIGH (ref 77.6–95.7)
MEAN PLATELET VOLUME: 8.2 fL (ref 6.8–10.7)
PLATELET COUNT: 182 10*9/L (ref 150–450)
RED BLOOD CELL COUNT: 2.18 10*12/L — ABNORMAL LOW (ref 3.95–5.13)
RED CELL DISTRIBUTION WIDTH: 23.1 % — ABNORMAL HIGH (ref 12.2–15.2)
WBC ADJUSTED: 5.1 10*9/L (ref 3.6–11.2)

## 2023-05-01 LAB — FOLLICLE STIMULATING HORMONE: FOLLICLE STIMULATING HORMONE: 2.6 m[IU]/mL

## 2023-05-01 NOTE — Unmapped (Unsigned)
 Outpatient Gynecology Note - New Patient    ASSESSMENT AND PLAN     Problem List Items Addressed This Visit    None  Visit Diagnoses         Fibroid    -  Primary      Abnormal uterine bleeding        Relevant Orders    Follicle Stimulating Hormone Level [facility]) (Completed)    CBC (Completed)    Prolactin (Completed)          - Will schedule with MD to discussed surgical management and possible EMB. ]    Return in about 1 week (around 05/08/2023) for GYN f/u with MD.    Servando Snare, CNM    SUBJECTIVE     This 38 y.o. [id number] is a new [facility] patient who presents for a problem visit.    HPI:  Reports bleeding with large clots to where she was going though super plus tampons. Reports she was Bleeding through clothes. Has known fibroid.    Was hospitalized in February for the abnormal bleeding and chronic anemia. Was prescribed a  provera taper. Noticed random spotting when she tapered down to 1 dose daily so she went back up to 2 pills.   Desires answer to bleeding and desires to talk about surgical options.     GYN History:   Last Pap:  01/2021  NILM  Contraceptive method: none     OB History   Gravida Para Term Preterm AB Living   4 1 0 1 2 1    SAB IAB Ectopic Molar Multiple Live Births   0 1 0 0  1      # Outcome Date GA Lbr Len/2nd Weight Sex Type Anes PTL Lv   4 Gravida            3 IAB 2023           2 AB 11/02/20 [redacted]w[redacted]d          1 Preterm 10/19/15 [redacted]w[redacted]d  830 g (1 lb 13.3 oz) M CS-LTranv Spinal  LIV      Birth Comments: preterm      Obstetric Comments   Denies STI   Denies abnormal pap smear s       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       Past Surgical History:   Procedure Laterality Date    AV FISTULA PLACEMENT  2018    CESAREAN SECTION      NEPHRECTOMY TRANSPLANTED ORGAN  05/02/2020    PR COLONOSCOPY FLX DX W/COLLJ SPEC WHEN PFRMD N/A 02/14/2020    Procedure: COLONOSCOPY, FLEXIBLE, PROXIMAL TO SPLENIC FLEXURE; DIAGNOSTIC, W/WO COLLECTION SPECIMEN BY BRUSH OR WASH;  Surgeon: Carmon Ginsberg, MD;  [facility]: [facility];  Service: Gastroenterology    PR COLONOSCOPY FLX DX W/COLLJ SPEC WHEN PFRMD N/A 06/19/2020    Procedure: COLONOSCOPY, FLEXIBLE, PROXIMAL TO SPLENIC FLEXURE; DIAGNOSTIC, W/WO COLLECTION SPECIMEN BY BRUSH OR WASH;  Surgeon: Carmon Ginsberg, MD;  Location: [facility]URES [facility];  Service: Gastroenterology    PR FECAL MICROBIOTA PREP INSTIL N/A 02/14/2020    Procedure: PREP Lacretia Nicks  INSTILLATION FECAL MICROBIOTA, ANY METHOD;  Surgeon: Carmon Ginsberg, MD;  Location: GI [facility]URES [facility];  Service: Gastroenterology    PR INDUCED ABORTN BY DIL/EVAC N/A 03/20/2021    Procedure: DILATION AND EVACUATION - INDUCED;  Surgeon: Gaynelle Cage, MD;  Location: [facility];  Service: Family Planning    PR LAP,DIAGNOSTIC ABDOMEN N/A 11/17/2020    Procedure: DIAGNOSTIC AND OR OPERATIVE LAPAROSCOPY;  Surgeon: Estil Daft, MD;  Location: MAIN OR [facility];  Service: Womens Primary Gynecology    PR PREPARE FECAL MICROBIOTA FOR INSTILLATION  06/19/2020    Procedure: PREP FECAL MICROBIOTA FOR INSTILLATION, INCLUDING ASSESSMENT OF DONOR SPECIMEN;  Surgeon: Carmon Ginsberg, MD;  Location: [facility]URES [facility];  Service: Gastroenterology    PR SURG RX MISSED ABORTN,1ST TRI N/A 11/17/2020    Procedure: VACUUM ASPIRATION;  Surgeon: Estil Daft, MD;  Location: MAIN OR [facility];  Service: [facility] Primary Gynecology    PR SURG RX MISSED ABORTN,1ST TRI N/A 11/19/2022    Procedure: TREATMENT OF MISSED ABORTION, COMPLETED SURGICALLY; FIRST TRIMESTER;  Surgeon: Asencion Partridge, MD;  Location: [facility] OR [facility];  Service: [facility] Primary Gynecology    PR TRANSPLANT ALLOGRAFT PANCREAS N/A 05/03/2019    Procedure: TRANSPLANTATION OF PANCREATIC ALLOGRAFT;  Surgeon: Leona Carry, MD;  Location: MAIN OR [facility];  Service: Transplant    PR TRANSPLANT,PREP CADAVER RENAL GRAFT N/A 05/03/2019    Procedure: [facility] STD PREP CAD DONR RENAL ALLOGFT PRIOR TO TRNSPLNT, INCL DISSEC/REM PERINEPH FAT, DIAPH/RTPER ATTAC;  Surgeon: Leona Carry, MD;  Location: MAIN OR [facility];  Service: Transplant    PR TRANSPLANT,PREP DONOR PANCREAS N/A 05/03/2019    Procedure: BACKBENCH STANDARD PREPARATION OF CADAVER DONOR PANCREAS ALLOGRAFT PRIOR TO TRANSPLANTATION;  Surgeon: Leona Carry, MD;  Location: MAIN OR [facility];  Service: Transplant    PR TRANSPLANTATION OF KIDNEY N/A 05/03/2019    Procedure: RENAL ALLOTRANSPLANTATION, IMPLANTATION OF GRAFT; WITHOUT RECIPIENT NEPHRECTOMY;  Surgeon: Leona Carry, MD;  Location: MAIN OR [facility];  Service: Transplant    TONSILLECTOMY         Social History     Tobacco Use    Smoking status: Never    Smokeless tobacco: Never   Vaping Use    Vaping status: Never Used   Substance Use Topics    Alcohol use: Never    Drug use: Never       Family History   Problem Relation Age of Onset    Diabetes Mother     Diabetes Father     Cancer Maternal Grandmother     Diabetes Maternal Grandfather     Diabetes Paternal Grandmother        Current Outpatient Medications   Medication Sig Dispense Refill    escitalopram oxalate (LEXAPRO) 5 MG tablet Take 2 tablets (10 mg total) by mouth daily. 60 tablet 11    folic acid (FOLVITE) 1 MG tablet Take 1 tablet (1 mg total) by mouth daily. 30 tablet 11    loperamide (IMODIUM A-D) 2 mg tablet Take 1 tablet (2 mg total) by mouth four (4) times a day as needed for diarrhea. 60 tablet 2    medroxyPROGESTERone (PROVERA) 10 MG tablet Continue medroxyprogesterone taper as below:    + Week 1 (2/24-3/3): medroxyprogesterone 20 mg three times daily for 7 days (you received 1 dose in the hospital, so you will have an extra day here  + Week 2 (3/4-3/10): medroxyprogesterone 20 mg twice daily for 7 days  +  Week (3/11- ): medroxyprogesterone 20 mg daily until outpatient clinic follow-up 130 tablet 1    methocarbamol (ROBAXIN) 500 MG tablet Take 1 tablet (500 mg total) by mouth every eight (8) hours as needed (muscle pain). 90 tablet 0    mirtazapine (REMERON) 15 MG tablet Take 1 tablet (15 mg total) by mouth nightly. 30 tablet 2    sodium bicarbonate 650 mg tablet Take 2 tablets (1,300 mg total) by mouth two (2) times a day. 400 tablet 3    tacrolimus (ENVARSUS XR) 1 mg Tb24 extended release tablet Take 3 tablets (3 mg total) by mouth daily. Take 3 tablets (3 mg total) by mouth daily with FOUR 4mg  tablets for total daily dose of 19mg  90 tablet 0    tacrolimus (ENVARSUS XR) 4 mg Tb24 extended release tablet Take 4 tablets (16 mg total) by mouth daily. Take with three 1 mg tablets for a total of 19 mg 120 tablet 11    acetaminophen (TYLENOL) 325 MG tablet Take 2 tablets (650 mg total) by mouth every four (4) hours as needed for pain. 100 tablet 2    butalbital-acetaminophen-caffeine (ESGIC) 50-325-40 mg per tablet Take 1 tablet by mouth every four (4) hours as needed for pain. 30 tablet 2    hydrocortisone 2.5 % cream Apply topically two (2) times a day. (Patient not taking: Reported on 05/01/2023) 30 g 0    metroNIDAZOLE (FLAGYL) 500 MG tablet Take 1 tablet (500 mg total) by mouth two (2) times a day. (Patient not taking: Reported on 05/01/2023) 7 tablet 0    metroNIDAZOLE (METROGEL) 0.75 % (37.5mg /5 gram) vaginal gel Insert into the vagina two (2) times a day for 7 days. 70 g 0    mycophenolate (MYFORTIC) 180 MG EC tablet Take 2 tablets (360 mg total) by mouth two (2) times a day. 360 tablet 3     No current facility-administered medications for this visit.       Allergies   Allergen Reactions    Iodinated Contrast Media Swelling, Rash and Other (See Comments)     Burning, warmth throughout body and tingling.  Throat swelling.  Treated with benadryl and symptoms improved.  Has not had contrast since then (2013 or 2014).    Burning    Nickel Rash    Privigen [Immun Glob G(Igg)-Pro-Iga 0-50] Muscle Pain     PRIVIGEN brand: back and leg pain at usual rate of infusion during first dose, then full body pain again when rechallenged at slower rate of 30 mL/hr    Propranolol Other (See Comments) and Swelling    Eye Irrigating Solution [Ophthalmic Irrigation Solution] Other (See Comments)     Contrast dye (name?) used in eyes caused hot feeling in face, reversed with Benadryl.     Iodine      Other reaction(s): Skin Rash    Naltrexone Rash    Uni-Cortrom Rash       REVIEW OF SYSTEMS: See HPI; remaining balance of 10 systems are negative/non-contributory.    OBJECTIVE     BP 125/73  - Pulse 95  - Wt 69.1 kg (152 lb 6.4 oz)  - LMP 03/10/2023 (Approximate)  - BMI 25.36 kg/m??   General: well-appearing and in NAD  Pelvic: Declined today.   Psych: pleasant and interactive, answers questions appropriately    A chaperone was present for any sensitive portions of the exam (if performed) including but not limited to, breast and pelvic examination.  If performed, consent was obtained from  patient prior to the sensitive examination.  If medical students were involved, explicit additional consent was obtained. Ref Range    Chlamydia trachomatis, NAA Negative Negative    Gonorrhoeae NAA Negative Negative    CT/GC Specimen Type Swab     CT/GC Specimen Source Cervix    Basic Metabolic Panel   Result Value Ref Range    Sodium 145 135 - 145 mmol/L    Potassium 5.0 (H) 3.4 - 4.8 mmol/L    Chloride 116 (H) 98 - 107 mmol/L    CO2 17.0 (L) 20.0 - 31.0 mmol/L    Anion Gap 12 5 - 14 mmol/L    BUN 33 (H) 9 - 23 mg/dL    Creatinine 1.61 (H) 0.55 - 1.02 mg/dL    BUN/Creatinine Ratio 13     eGFR CKD-EPI (2021) Female 25 (L) >=60 mL/min/1.15m2    Glucose 92 70 - 179 mg/dL    Calcium 9.2 8.7 - 09.6 mg/dL   PTT   Result Value Ref Range    APTT 34.2 24.8 - 38.4 sec    Heparin Correlation 0.2    PT-INR   Result Value Ref Range    PT 13.6 (H) 9.9 - 12.6 sec    INR 1.19    hCG QUANTitative, Blood   Result Value Ref Range    hCG Quantitative <2.6 mIU/mL   Type and Screen with Confirmation ABORh   Result Value Ref Range    ABO Grouping O POS     Antibody Screen NEG    CBC w/ Differential   Result Value Ref Range    Results Verified by Slide Scan Slide Reviewed     WBC 4.9 3.6 - 11.2 10*9/L    RBC 2.18 (L) 3.95 - 5.13 10*12/L    HGB 7.6 (L) 11.3 - 14.9 g/dL    HCT 04.5 (L) 40.9 - 44.0 %    MCV 104.2 (H) 77.6 - 95.7 fL    MCH 34.8 (H) 25.9 - 32.4 pg    MCHC 33.4 32.0 - 36.0 g/dL    RDW 81.1 (H) 91.4 - 15.2 %    MPV      Platelet >206 150 - 450 10*9/L    Neutrophils % 61.7 %    Lymphocytes % 14.3 %    Monocytes % 16.5 %    Eosinophils % 5.3 %    Basophils % 2.2 %    Absolute Neutrophils 3.0 1.8 - 7.8 10*9/L    Absolute Lymphocytes 0.7 (L) 1.1 - 3.6 10*9/L    Absolute Monocytes 0.8 0.3 - 0.8 10*9/L    Absolute Eosinophils 0.3 0.0 - 0.5 10*9/L    Absolute Basophils 0.1 0.0 - 0.1 10*9/L    Anisocytosis Marked (A) Not Present   Morphology Review   Result Value Ref Range    Smear Review Comments See Comment (A) Undefined    Burr Cells Present (A) Not Present    Acanthocytes Present (A) Not Present    Poikilocytosis Moderate (A) Not Present     *Note: Due to a large number of results and/or encounters for the requested time period, some results have not been displayed. A complete set of results can be found in Results Review.

## 2023-05-01 NOTE — Unmapped (Signed)
 Vaginitis Molecular Panel  Order: [id number]   Status: Final result    Test Result Released: Yes (not seen)    Specimen Information:Clinician-collected Vaginal Swab    Component  Ref Range & Units (hover)   Bacterial Vaginitis    Positive Abnormal   Candida group NAATNot Detected  Candida glabrata/Candida krusei NAATNot Detected  Trichomonas NAATNot Detected      Per Dr. Matilde Haymaker, no further action indicated by resource nurse team.

## 2023-05-05 DIAGNOSIS — Z940 Kidney transplant status: Principal | ICD-10-CM

## 2023-05-05 MED ORDER — TACROLIMUS XR 4 MG TABLET,EXTENDED RELEASE 24 HR
ORAL_TABLET | Freq: Every day | ORAL | 3 refills | 90 days | Status: CP
Start: 2023-05-05 — End: 2024-05-04
  Filled 2023-05-08: qty 150, 30d supply, fill #0

## 2023-05-05 NOTE — Unmapped (Signed)
 Reviewed recent tac level 4.5 with Dr. Trinda Pascal advised to increase envarsus to 20 mg daily.  She will increase today and repeat labs Wednesday after her GYN appt.

## 2023-05-06 NOTE — Unmapped (Signed)
 UNOS form

## 2023-05-07 ENCOUNTER — Ambulatory Visit: Admit: 2023-05-07 | Discharge: 2023-05-07 | Payer: MEDICARE | Attending: Clinical | Primary: Clinical

## 2023-05-07 ENCOUNTER — Encounter: Admit: 2023-05-07 | Discharge: 2023-05-07 | Payer: MEDICARE

## 2023-05-07 ENCOUNTER — Ambulatory Visit: Admit: 2023-05-07 | Discharge: 2023-05-07 | Payer: MEDICARE

## 2023-05-07 DIAGNOSIS — Z30430 Encounter for insertion of intrauterine contraceptive device: Principal | ICD-10-CM

## 2023-05-07 DIAGNOSIS — N939 Abnormal uterine and vaginal bleeding, unspecified: Principal | ICD-10-CM

## 2023-05-07 MED ADMIN — levonorgestrel (Liletta) 20.4 mcg/24 hrs (8 yrs) 52 mg intrauterine device: 1 | INTRAUTERINE | @ 15:00:00 | Stop: 2023-05-07

## 2023-05-07 NOTE — Unmapped (Addendum)
 [facility] Pharmacist has reviewed a new prescription for envarsus that indicates a dose increase.  Patient was counseled on this dosage change by coordinator C C- see epic note from 3/24.  Next refill call date adjusted if necessary.        Clinical Assessment Needed For: Dose Change  Medication: Envarsus XR 4mg  tablet  Last Fill Date/Day Supply: 04/10/2023 / 30 days  Copay $0  Was previous dose already scheduled to fill: No    Notes to Pharmacist: N/A

## 2023-05-07 NOTE — Unmapped (Signed)
 Outpatient Gynecology Note: Return Visit    ASSESSMENT AND PLAN     Abbigale Gerrie Castiglia is a 38 y.o. [phone] who presents for the follow up of AUB, possibly ovulatory dysfunction vs coagulapathy    No problem-specific Assessment & Plan notes found for this encounter.    Assessment & Plan  Heavy Menstrual Bleeding  Chronic irregular and heavy menstrual bleeding with recent severe episode. Hemoglobin improved to mid 7s s/p transfusion.  History of prior IUD expulsion noted. Small fibroid unlikely cause given intramural location. Differential includes von Willebrand disease. Recent normal INR & aPTT.  Discussed treatment options; recommended to avoid estrogen-containing contraceptives given her other Medical comorbidities.  We discussed progestin only options.  She feels her mood has been negatively affected by being on the Provera.  Discussed this could also be a consideration with any other systemic progestin such as Depo-Provera.  She agreed to try IUD again understanding the potential increased risk for device expulsion given her history.  I recommended EMB today.  Informed consent obtained for biopsy and IUD insertion.  - Endometrial biopsy performed and Liletta IUD inserted today.  - Order blood panel for von Willebrand disease screening.  - Continue Provera twice daily for one week, then reduce to once daily for another week before stopping.  - Schedule follow-up appointment to review biopsy results and assess IUD effectiveness.  - Reviewed surgical options per her request.  We reviewed that endometrial ablation is irreversible and has a higher likelihood of failure when performed in younger patients.  Also reviewed hysterectomy.  Advised to exercise caution with pursuing surgical treatment before exhausting conservative management, particularly given the patient's other active medical conditions.  The patient is in agreement with this logic.    I personally spent 37 minutes face-to-face and non-face-to-face in the care of this patient, which includes all pre, intra, and post visit time on the date of service, separate from procedural time.    The patient granted permission at the beginning of this encounter for incorporation of Abridge technology to assist with composing this note.  I have fully reviewed and made manual edits to the AI generated note myself.      Return in about 3 weeks (around 05/28/2023) for Recheck.     Theresia Majors, MD       HISTORY     CC: follow up heavy periods with anemia      History of Present Illness  Electa Aerilynn Goin is a 38 year old female with end stage renal disease, s/p pancreas and kidney transplant, and chronic heavy and irregular periods who presents for evaluation and management of heavy periods resulting in a recent severe anemia episode.    She presents for follow-up after a recent hospitalization from 2/23 to 2/27 due to heavy menstrual bleeding contributing to severe anemia with a hemoglobin level of 4.8. During her hospital stay, she had transfusion of three units of packed red blood cells. She was started on a Provera taper, which has somewhat improved her vaginal bleeding.    She has a history of irregular menstrual cycles since her IUD fell out after her son was born in 2017.  The IUD controls her menstrual bleeding well.  Since then, her periods have been irregular, sometimes occurring every 2-3 months, and sometimes more than once a month. She describes her recent bleeding episode leading to her hospitalization as unusually heavy, using 'five super plus tampons in an hour' and clotting, which she had not experienced before.  She is currently experiencing just spotting.    A recent pelvic ultrasound during her initial hospitalization workup showed a small intramural fibroid in the anterior the uterus, stable from 2 months ago. She has not had any workup for bleeding disorders like von Willebrand disease in the past. Her hemoglobin has been around the mid-7s since her hospitalization.    She is currently taking Provera twice a day and has noted mood changes, which she attributes to the medication. She is also on antidepressants.  Denies sexual activity or having a partner currently.      Review of Systems  A 12 system review of systems was negative except as noted in HPI    The following portions of the patient's history were reviewed and updated as appropriate: allergies, current medications, past obstetric history, family history, past medical history, past social history, past surgical history and problem list.    PHYSICAL EXAM   BP 159/85 (BP Site: L Arm, BP Position: Sitting, BP Cuff Size: Medium) Comment: Pt. reported not feeling any chest pain or headache right now. - Pulse 83  - Wt 70.5 kg (155 lb 6.4 oz)  - LMP  (LMP Unknown)  - Breastfeeding No  - BMI 25.86 kg/m??   Body mass index is 25.86 kg/m??.     Constitutional: No distress  Cardiovascular: RRR    Pulmonary/Chest: CTAB  Breasts: right and left breast normal without mass, skin or nipple changes or axillary nodes.  Abdominal: soft, ND, NT  Adnexa/Parametria: No masses; no parametrial nodularity; no tenderness  Genitourinary:          External Genitalia: Normal female genitalia     Urethral Meatus: Normal caliber and position     Urethra: Midline, no masses     Bladder: Well-suspended, NT     Vagina: rugated no lesions.     Cervix: No lesions, normal size and consistency; no cervical motion     tenderness      Uterus: Normal size and contour; smooth, mobile, nontender  Extremities: No edema   Neurological: A&O x 3   Skin: Warm, dry, no rash  Neuro: Alert and conversational  Psychiatric: Normal mood and affect    IUD INSERTION & ENDOMETRIAL BIOPSY  I counseled the patient on the Liletta IUD including in the initial period of time after insertion to expect cramping and/or abnormal bleeding, especially in the first 3-6 months use.  After this time period the period can get very light or some women become amenorrheic. Insertion risks include infection, uterine perforation and device migration into the abdomen which would require surgical removal, or the device can become dislodged causing pain or fall out of which would preclude effective contraception (she would be at higher risk due to her prior history of expulsion). Discussed low risk of contraception failure, increased risk for ectopic pregnancy conception occurs, and increased risk for ovarian cysts. After discussion today, the patient decides that she would like to try the [place].  Declined paracervical block.  We discussed need to use backup contraception in the first 7 days after insertion. Approved for 8 years of contraception. She agreed to proceed with insertion.    PROCEDURE NOTE  The cervix was cleansed with a Betadine swab x3. The anterior lip of the cervix was grasped with an Allis clamp. The endometrial Pipelle sampler was easily introduced into the endometrial cavity and sound length measurement was taken (8 cm). The plunger was withdrawn causing suction in the Pipelle and the device was moved  about the cavity for about 10 sec. The Pipelle was removed then the sample was emptied in a specimen cup, maintaining sterile technique. The Pipelle was reintroduced into the endometrial cavity to collect more sample. The moderate amount of specimen contents were collected and sent to pathology for analysis.   The Liletta was the inserted.  Maintaining sterility, the IUD was pulled into the insertion tube and the flange on the IUD insertion tube was set to the appropriate length.  The insertion tube was inserted into the cervix and endometrial cavity without difficulty until gentle fundal resistance was met. The device was withdrawn slightly as arms of the IUD were deployed and the [place] was inserted without difficulty as it was gently advanced to the fundus. The strings were trimmed to a length of 3 cm. The patient tolerated the procedure well without any complication.    Chaperone present for exam and procedure.

## 2023-05-07 NOTE — Unmapped (Signed)
 Please continue to take the Provera twice per day for 1 week after IUD insertion, then decrease to once per day for another 1 week, then stop the medication

## 2023-05-07 NOTE — Unmapped (Signed)
 Transplant Psychology No Show     Pt did not show to scheduled transplant psychology appt today. Writer sent pt video link at appt time, called several minutes later. No answer, left VM requesting return call.      Theda Sers, Ph.D.  Clinical Psychologist

## 2023-05-07 NOTE — Unmapped (Signed)
 [facility] Clinical Assessment & Refill Coordination Note    Elaine Koch, DOB: 1985-07-23  Phone: There are no phone numbers on file.    All above HIPAA information was verified with patient.     Was a Nurse, learning disability used for this call? No    Specialty Medication(s):   Transplant: Envarsus 4mg  and  mycophenolic acid 180mg      Current Outpatient Medications   Medication Sig Dispense Refill    acetaminophen (TYLENOL) 325 MG tablet Take 2 tablets (650 mg total) by mouth every four (4) hours as needed for pain. 100 tablet 2    butalbital-acetaminophen-caffeine (ESGIC) 50-325-40 mg per tablet Take 1 tablet by mouth every four (4) hours as needed for pain. 30 tablet 2    escitalopram oxalate (LEXAPRO) 5 MG tablet Take 2 tablets (10 mg total) by mouth daily. 60 tablet 11    folic acid (FOLVITE) 1 MG tablet Take 1 tablet (1 mg total) by mouth daily. 30 tablet 11    hydrocortisone 2.5 % cream Apply topically two (2) times a day. (Patient not taking: Reported on 05/07/2023) 30 g 0    loperamide (IMODIUM A-D) 2 mg tablet Take 1 tablet (2 mg total) by mouth four (4) times a day as needed for diarrhea. 60 tablet 2    medroxyPROGESTERone (PROVERA) 10 MG tablet Continue medroxyprogesterone taper as below:    + Week 1 (2/24-3/3): medroxyprogesterone 20 mg three times daily for 7 days (you received 1 dose in the hospital, so you will have an extra day here  + Week 2 (3/4-3/10): medroxyprogesterone 20 mg twice daily for 7 days  + Week (3/11- ): medroxyprogesterone 20 mg daily until outpatient clinic follow-up 130 tablet 1    methocarbamol (ROBAXIN) 500 MG tablet Take 1 tablet (500 mg total) by mouth every eight (8) hours as needed (muscle pain). 90 tablet 0    metroNIDAZOLE (FLAGYL) 500 MG tablet Take 1 tablet (500 mg total) by mouth two (2) times a day. (Patient not taking: Reported on 05/07/2023) 7 tablet 0    metroNIDAZOLE (METROGEL) 0.75 % (37.5mg /5 gram) vaginal gel Insert into the vagina two (2) times a day for 7 days. (Patient not taking: Reported on 05/07/2023) 70 g 0    mirtazapine (REMERON) 15 MG tablet Take 1 tablet (15 mg total) by mouth nightly. 30 tablet 2    mycophenolate (MYFORTIC) 180 MG EC tablet Take 2 tablets (360 mg total) by mouth two (2) times a day. (Patient not taking: Reported on 05/07/2023) 360 tablet 3    sodium bicarbonate 650 mg tablet Take 2 tablets (1,300 mg total) by mouth two (2) times a day. 400 tablet 3    tacrolimus (ENVARSUS XR) 4 mg Tb24 extended release tablet Take 5 tablets (20 mg total) by mouth in the morning. 450 tablet 3     No current facility-administered medications for this visit.        Changes to medications: Elaine Koch reports no changes at this time.    Medication list has been reviewed and updated in Epic: Yes    Allergies   Allergen Reactions    Iodinated Contrast Media Swelling, Rash and Other (See Comments)     Burning, warmth throughout body and tingling.  Throat swelling.  Treated with benadryl and symptoms improved.  Has not had contrast since then (2013 or 2014).    Burning    Nickel Rash    Privigen [Immun Glob G(Igg)-Pro-Iga 0-50] Muscle Pain  PRIVIGEN brand: back and leg pain at usual rate of infusion during first dose, then full body pain again when rechallenged at slower rate of 30 mL/hr    Propranolol Other (See Comments) and Swelling    Eye Irrigating Solution [Ophthalmic Irrigation Solution] Other (See Comments)     Contrast dye (name?) used in eyes caused hot feeling in face, reversed with Benadryl.     Iodine      Other reaction(s): Skin Rash    Naltrexone Rash    Uni-Cortrom Rash       Changes to allergies: No    Allergies have been reviewed and updated in Epic: Yes    SPECIALTY MEDICATION ADHERENCE     Envarsus 4mg   : 5 days of medicine on hand   Mycophenolate 180mg   : 5 days of medicine on hand from previous fill at outside pharmacy, patient verified tabs unexpired    Medication Adherence    Patient reported X missed doses in the last month: 0  Specialty Medication: envarsus 4mg   Patient is on additional specialty medications: Yes  Additional Specialty Medications: Mycophenolate 180mg   Patient Reported Additional Medication X Missed Doses in the Last Month: 0  Patient is on more than two specialty medications: No          Specialty medication(s) dose(s) confirmed: Patient reports changes to the regimen as follows: restarted mycophenolate, envarsus now 20mg  daily      Are there any concerns with adherence? No    Adherence counseling provided? Not needed    CLINICAL MANAGEMENT AND INTERVENTION      Clinical Benefit Assessment:    Do you feel the medicine is effective or helping your condition? Yes    Clinical Benefit counseling provided? Not needed    Adverse Effects Assessment:    Are you experiencing any side effects? No    Are you experiencing difficulty administering your medicine? No    Quality of Life Assessment:    Quality of Life    Rheumatology  Oncology  Dermatology  Cystic Fibrosis          How many days over the past month did your transplant  keep you from your normal activities? For example, brushing your teeth or getting up in the morning. 0    Have you discussed this with your provider? Not needed    Acute Infection Status:    Acute infections noted within Epic:  No active infections    Patient reported infection: None    Therapy Appropriateness:    Is therapy appropriate based on current medication list, adverse reactions, adherence, clinical benefit and progress toward achieving therapeutic goals? Yes, therapy is appropriate and should be continued     Clinical Intervention:    Was an intervention completed as part of this clinical assessment? No    DISEASE/MEDICATION-SPECIFIC INFORMATION      N/A    Solid Organ Transplant: Not Applicable    PATIENT SPECIFIC NEEDS     Does the patient have any physical, cognitive, or cultural barriers? No    Is the patient high risk? Yes, patient is taking a REMS drug. Medication is dispensed in compliance with REMS program    Does the patient require physician intervention or other additional services (i.e., nutrition, smoking cessation, social work)? No    Does the patient have an additional or emergency contact listed in their chart? Yes    SOCIAL DETERMINANTS OF HEALTH     At the [facility], we have learned  that life circumstances - like trouble affording food, housing, utilities, or transportation can affect the health of many of our patients.   That is why we wanted to ask: are you currently experiencing any life circumstances that are negatively impacting your health and/or quality of life? Patient declined to answer    Social Drivers of Health     Food Insecurity: No Food Insecurity (04/07/2023)    Hunger Vital Sign     Worried About Running Out of Food in the Last Year: Never true     Ran Out of Food in the Last Year: Never true   Tobacco Use: Low Risk  (05/01/2023)    Patient History     Smoking Tobacco Use: Never     Smokeless Tobacco Use: Never     Passive Exposure: Not on file   Transportation Needs: No Transportation Needs (04/07/2023)    PRAPARE - Transportation     Lack of Transportation (Medical): No     Lack of Transportation (Non-Medical): No   Alcohol Use: Not At Risk (04/24/2023)    Alcohol Use     How often do you have a drink containing alcohol?: Never     How many drinks containing alcohol do you have on a typical day when you are drinking?: Not on file     How often do you have 5 or more drinks on one occasion?: Never   Housing: Low Risk  (04/07/2023)    Housing     Within the past 12 months, have you ever stayed: outside, in a car, in a tent, in an overnight shelter, or temporarily in someone else's home (i.e. couch-surfing)?: No     Are you worried about losing your housing?: No   Physical Activity: Insufficiently Active (04/20/2019)    Exercise Vital Sign     Days of Exercise per Week: 2 days     Minutes of Exercise per Session: 30 min   Utilities: Low Risk  (04/07/2023) Utilities     Within the past 12 months, have you been unable to get utilities (heat, electricity) when it was really needed?: No   Stress: No Stress Concern Present (04/20/2019)    [employer] of Occupational Health - Occupational Stress Questionnaire     Feeling of Stress : Only a little   Interpersonal Safety: Not At Risk (04/24/2023)    Interpersonal Safety     Unsafe Where You Currently Live: No     Physically Hurt by Anyone: No     Abused by Anyone: No   Substance Use: Not on file (12/16/2022)   Intimate Partner Violence: Not At Risk (04/24/2023)    Humiliation, Afraid, Rape, and Kick questionnaire     Fear of Current or Ex-Partner: No     Emotionally Abused: No     Physically Abused: No     Sexually Abused: No   Social Connections: Unknown (04/20/2019)    Social Connection and Isolation Panel [NHANES]     Frequency of Communication with Friends and Family: Patient declined     Frequency of Social Gatherings with Friends and Family: Patient declined     Attends Religious Services: Patient declined     Database administrator or Organizations: Patient declined     Attends Banker Meetings: Patient declined     Marital Status: Patient declined   Programmer, applications: Low Risk  (04/07/2023)    Overall Financial Resource Strain (CARDIA)     Difficulty of Paying Living Expenses:  Not hard at all   Depression: At risk (04/24/2023)    PHQ-2     PHQ-2 Score: 3   Internet Connectivity: Not on file   Health Literacy: Low Risk  (04/24/2023)    Health Literacy     : Never       Would you be willing to receive help with any of the needs that you have identified today? Not applicable       SHIPPING     Specialty Medication(s) to be Shipped:   Transplant: Envarsus 4mg  and  mycophenolic acid 180mg     Other medication(s) to be shipped:  folic acid, mirtazapine  Patient declined ALL other refills at time     Changes to insurance: No    Cost and Payment: Patient has a copay of $0 for envarsus/mycophenolate, $1.60 for mirtazapine, $5.40 for folic acid. They are aware and have authorized the pharmacy to charge the credit card on file.    Delivery Scheduled: Yes, Expected medication delivery date: 05/09/2023.     Medication will be delivered via UPS to the confirmed prescription address in [facility].    The patient will receive a drug information handout for each medication shipped and additional FDA Medication Guides as required.  Verified that patient has previously received a Conservation officer, historic buildings and a Surveyor, mining.    The patient or caregiver noted above participated in the development of this care plan and knows that they can request review of or adjustments to the care plan at any time.      All of the patient's questions and concerns have been addressed.    Thad Ranger, PharmD   [facility] Specialty Pharmacist

## 2023-05-08 MED FILL — MIRTAZAPINE 15 MG TABLET: ORAL | 30 days supply | Qty: 30 | Fill #0

## 2023-05-14 MED FILL — MEDROXYPROGESTERONE 10 MG TABLET: 11 days supply | Qty: 58 | Fill #0

## 2023-05-15 DIAGNOSIS — F332 Major depressive disorder, recurrent severe without psychotic features: Principal | ICD-10-CM

## 2023-05-15 LAB — CBC W/ DIFFERENTIAL
BANDED NEUTROPHILS ABSOLUTE COUNT: 0 10*3/uL (ref 0.0–0.1)
BASOPHILS ABSOLUTE COUNT: 0 10*3/uL (ref 0.0–0.2)
BASOPHILS RELATIVE PERCENT: 1 %
EOSINOPHILS ABSOLUTE COUNT: 0.7 10*3/uL — ABNORMAL HIGH (ref 0.0–0.4)
EOSINOPHILS RELATIVE PERCENT: 18 %
HEMATOCRIT: 24.9 % — ABNORMAL LOW (ref 34.0–46.6)
HEMOGLOBIN: 8.2 g/dL — ABNORMAL LOW (ref 11.1–15.9)
IMMATURE GRANULOCYTES: 0 %
LYMPHOCYTES ABSOLUTE COUNT: 0.5 10*3/uL — ABNORMAL LOW (ref 0.7–3.1)
LYMPHOCYTES RELATIVE PERCENT: 14 %
MEAN CORPUSCULAR HEMOGLOBIN CONC: 32.9 g/dL (ref 31.5–35.7)
MEAN CORPUSCULAR HEMOGLOBIN: 34.3 pg — ABNORMAL HIGH (ref 26.6–33.0)
MEAN CORPUSCULAR VOLUME: 104 fL — ABNORMAL HIGH (ref 79–97)
MONOCYTES ABSOLUTE COUNT: 0.3 10*3/uL (ref 0.1–0.9)
MONOCYTES RELATIVE PERCENT: 9 %
NEUTROPHILS ABSOLUTE COUNT: 2.2 10*3/uL (ref 1.4–7.0)
NEUTROPHILS RELATIVE PERCENT: 58 %
PLATELET COUNT: 135 10*3/uL — ABNORMAL LOW (ref 150–450)
RED BLOOD CELL COUNT: 2.39 x10E6/uL — CL (ref 3.77–5.28)
RED CELL DISTRIBUTION WIDTH: 17.3 % — ABNORMAL HIGH (ref 11.7–15.4)
WHITE BLOOD CELL COUNT: 3.8 10*3/uL (ref 3.4–10.8)

## 2023-05-15 LAB — RENAL FUNCTION PANEL
ALBUMIN: 4.3 g/dL (ref 3.9–4.9)
BLOOD UREA NITROGEN: 31 mg/dL — ABNORMAL HIGH (ref 6–20)
BUN / CREAT RATIO: 14 (ref 9–23)
CALCIUM: 9.1 mg/dL (ref 8.7–10.2)
CHLORIDE: 112 mmol/L — ABNORMAL HIGH (ref 96–106)
CO2: 18 mmol/L — ABNORMAL LOW (ref 20–29)
CREATININE: 2.27 mg/dL — ABNORMAL HIGH (ref 0.57–1.00)
EGFR: 28 mL/min/{1.73_m2} — ABNORMAL LOW
GLUCOSE: 88 mg/dL (ref 70–99)
PHOSPHORUS, SERUM: 4.1 mg/dL (ref 3.0–4.3)
POTASSIUM: 5.6 mmol/L — ABNORMAL HIGH (ref 3.5–5.2)
SODIUM: 142 mmol/L (ref 134–144)

## 2023-05-15 LAB — MAGNESIUM: MAGNESIUM: 1.7 mg/dL (ref 1.6–2.3)

## 2023-05-15 MED ORDER — ESCITALOPRAM 5 MG TABLET
ORAL_TABLET | Freq: Every day | ORAL | 11 refills | 30 days | Status: CP
Start: 2023-05-15 — End: 2024-05-09
  Filled 2023-05-16: qty 180, 90d supply, fill #0

## 2023-05-16 DIAGNOSIS — Z940 Kidney transplant status: Principal | ICD-10-CM

## 2023-05-16 LAB — TACROLIMUS LEVEL: TACROLIMUS BLOOD: 11.3 ng/mL (ref 5.0–20.0)

## 2023-05-16 MED ORDER — TACROLIMUS XR 1 MG TABLET,EXTENDED RELEASE 24 HR
ORAL_TABLET | Freq: Every day | ORAL | 3 refills | 90 days | Status: CP
Start: 2023-05-16 — End: ?
  Filled 2023-06-06: qty 360, 90d supply, fill #0

## 2023-05-16 MED ORDER — TACROLIMUS XR 4 MG TABLET,EXTENDED RELEASE 24 HR
ORAL_TABLET | Freq: Every day | ORAL | 3 refills | 90 days | Status: CP
Start: 2023-05-16 — End: 2024-05-15
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
## 2023-05-16 NOTE — Unmapped (Signed)
 Pt reports tac level as trough, advised to decrease envarsus to 19 mg and repeat labs on 1-2 weeks.

## 2023-05-20 ENCOUNTER — Ambulatory Visit: Admit: 2023-05-20 | Discharge: 2023-05-21

## 2023-05-20 MED ORDER — MIRTAZAPINE 15 MG TABLET
ORAL_TABLET | Freq: Every evening | ORAL | 2 refills | 30.00 days | Status: CP
Start: 2023-05-20 — End: 2023-08-18
  Filled 2023-06-06: qty 30, 30d supply, fill #0

## 2023-05-20 MED ORDER — ESCITALOPRAM 10 MG TABLET
ORAL_TABLET | Freq: Every day | ORAL | 2 refills | 30.00 days | Status: CP
Start: 2023-05-20 — End: 2023-08-18

## 2023-05-20 NOTE — Unmapped (Signed)
 Follow-up instructions:  - Please continue taking your medications as prescribed for your mental health.   - Do not make changes to your medications, including taking more or less than prescribed, unless under the supervision of your physician. Be aware that some medications may make you feel worse if abruptly stopped  - Please refrain from using illicit substances, as these can affect your mood and could cause anxiety or other concerning symptoms.   - Seek further medical care for any increase in symptoms or new symptoms such as thoughts of wanting to hurt yourself or hurt others.     Contact info:  Life-threatening emergencies: Call 911, the 988 suicide and crisis lifeline, or go to the nearest ER for medical or psychiatric attention.           Issues that need urgent attention but are not life threatening: Call the clinic outpatient front desk for assistance:    - [phone] for [facility] adult psychiatry clinics located at [address] [facility]  - [phone] for [facility] child and adolescent psychiatry clinics located at [address]  - [phone] for [facility] and adult psychiatry clinics located at [address]    Non-urgent routine concerns and questions: Send a message through [facility] or call our clinic front desk.    Refill requests: Check with your pharmacy to initiate refill requests.    Regarding appointments:  - If you need to cancel your appointment, we ask that you call your clinic at least 24 hours before your scheduled appointment at the numbers listed above.  - If for any reason you arrive 15 minutes later than your scheduled appointment time, you may not be seen and your visit may be rescheduled.  - Please remember that we will not automatically reschedule missed appointments.  - If you miss two (2) appointments without letting us know in advance, you will likely be referred to a provider in your community.  - We will do our best to be on time. Sometimes an emergency will arise that might cause your clinician to be late. We will try to inform you of this when you check in for your appointment. If you wait more than 15 minutes past your appointment time without such notice, please speak with the front desk staff.    In the event of bad weather, the clinic staff will attempt to contact you, should your appointment need to be rescheduled. Additionally, you can call the Patient Weather Line [phone] for system-wide clinic status    For more information and reminders regarding clinic policies (these were provided when you were admitted to the clinic), please ask the front desk.

## 2023-05-21 NOTE — Unmapped (Signed)
I saw and evaluated the patient, participating in the key portions of the service.  I reviewed the resident???s note.  I agree with the resident???s findings and plan.     Cloria Spring, MD

## 2023-05-26 DIAGNOSIS — Z940 Kidney transplant status: Principal | ICD-10-CM

## 2023-05-27 NOTE — Unmapped (Signed)
 Called pt regarding MyChart message about bleeding with IUD. Pt has had IUD in for about 1 month. Pt states that yesterday she went through 3 super plus tampons and is having clots. She has had this amount of bleeding since she stopped taking the provera  Monday. She missed a dose of provera  on Sunday and had spotting, then started bleeding more Monday. States her bleeding is the same today as yesterday.     Reviewed message from Dr. Jacqulyne Maxim:  Is patient referring to still taking the Provera ? She should be able to discontinue it at this point. The IUD will take a few months to regulate the bleeding.   It is normal to have a period with the IUD, only about 30% of women won't get a period anymore with the IUD in place, but it should improve the amount of menses, although this effect may not be noted until after it has been in place for 3-6 months at  least.     Advised pt to come to follow up appt scheduled for 4/18 with Dr. Jacqulyne Maxim to discuss future plan. Advised that we will message him to see if he recommends anything in the meantime. Reviewed bleeding precautions, stressing that if she fully saturates a maxi pad in 1 hour for 2 consecutive hours, she needs to go to the ED. Pt verbalizes understanding. All questions answered.

## 2023-05-28 NOTE — Unmapped (Signed)
 I would recommend that she restart the Provera  10 mg twice per day for the meantime. ER precautions as discussed.  Will have to discuss more with her at the next visit.

## 2023-05-28 NOTE — Unmapped (Signed)
Pt notified per message below and VU

## 2023-05-30 ENCOUNTER — Ambulatory Visit: Admit: 2023-05-30 | Discharge: 2023-05-31 | Payer: MEDICARE

## 2023-05-30 DIAGNOSIS — Z30431 Encounter for routine checking of intrauterine contraceptive device: Principal | ICD-10-CM

## 2023-05-30 NOTE — Unmapped (Signed)
 Outpatient Gynecology Note - IUD check    ASSESSMENT AND PLAN     Problem List Items Addressed This Visit    None  Visit Diagnoses         Intrauterine device surveillance    -  Primary            - Liletta IUD in place with visible strings.  - Patient informed about expectations for the IUD, including irregular bleeding and mild cramping for approx 3-4 months, but was reassured that most women will have lighter menses or no menses after 6 months of use or heavier bleeding pattern. She was also informed that the strings will continue to soften over time.  - IUD is due for removal in 2033.  - Monitor bleeding pattern over next few months. Reassess options if bleeding does not improve.  - Consider hysterectomy if bleeding remains uncontrolled.  - Pt in agreement to continue to try the IUD for now to give it more time to hopefully improve bleeding symptoms over the next few months.    Return if symptoms worsen or fail to improve.    Mallory Seaman, MD    SUBJECTIVE     This 38 y.o. [phone] is an established [facility] patient who presents for an IUD check. She is s/p Liletta IUD placement on 05/07/23.     History of Present Illness  Liletta IUD was inserted for the management of abnormal uterine bleeding. An endometrial biopsy performed prior to the IUD insertion showed benign tissue. Since the IUD insertion, she has experienced light bleeding with irregular periods. She had a significant episode of bleeding where she used three super tampons in one day and passed blood clots while tapering off Provera, which she had been taking prior to the IUD insertion. She did call in and restarting Provera was recommended but she ended up not resuming Provera after this episode, and the bleeding subsequently became lighter and just spotty.    No pain associated with the IUD and she does not feel the string. The current bleeding is described as 'spotty' and has improved without the use of Provera. She was concerned about the initial clotting, but it stopped after the first day.     Past medical and surgical history was reviewed and updated in EHR.    Current Outpatient Medications   Medication Sig Dispense Refill    acetaminophen (TYLENOL) 325 MG tablet Take 2 tablets (650 mg total) by mouth every four (4) hours as needed for pain. 100 tablet 2    butalbital-acetaminophen-caffeine (ESGIC) 50-325-40 mg per tablet Take 1 tablet by mouth every four (4) hours as needed for pain. 30 tablet 2    escitalopram oxalate (LEXAPRO) 10 MG tablet Take 1 tablet (10 mg total) by mouth daily. 30 tablet 2    folic acid (FOLVITE) 1 MG tablet Take 1 tablet (1 mg total) by mouth daily. 30 tablet 11    loperamide (IMODIUM A-D) 2 mg tablet Take 1 tablet (2 mg total) by mouth four (4) times a day as needed for diarrhea. 60 tablet 2    medroxyPROGESTERone (PROVERA) 10 MG tablet Continue medroxyprogesterone taper as below:    + Week 1 (2/24-3/3): medroxyprogesterone 20 mg three times daily for 7 days (you received 1 dose in the hospital, so you will have an extra day here  + Week 2 (3/4-3/10): medroxyprogesterone 20 mg twice daily for 7 days  + Week (3/11- ): medroxyprogesterone 20 mg daily until outpatient clinic follow-up 130  tablet 1    methocarbamol  (ROBAXIN ) 500 MG tablet Take 1 tablet (500 mg total) by mouth every eight (8) hours as needed (muscle pain). 90 tablet 0    mirtazapine  (REMERON ) 15 MG tablet Take 1 tablet (15 mg total) by mouth nightly. 30 tablet 2    mycophenolate  (MYFORTIC ) 180 MG EC tablet Take 2 tablets (360 mg total) by mouth two (2) times a day. 360 tablet 3    sodium bicarbonate  650 mg tablet Take 2 tablets (1,300 mg total) by mouth two (2) times a day. 400 tablet 3    tacrolimus  (ENVARSUS  XR) 4 mg Tb24 extended release tablet Take 4 tablets (16 mg total) by mouth in the morning. Take in addition to 1 mg tablets for a total daily dose of 19 mg. 360 tablet 3    hydrocortisone  2.5 % cream Apply topically two (2) times a day. (Patient not taking: Reported on 05/01/2023) 30 g 0    metroNIDAZOLE  (FLAGYL ) 500 MG tablet Take 1 tablet (500 mg total) by mouth two (2) times a day. (Patient not taking: Reported on 05/01/2023) 7 tablet 0    tacrolimus  (ENVARSUS  XR) 1 mg Tb24 extended release tablet Take 3 tablets (3 mg total) by mouth in the morning. Take in addition to 4 mg tablets for total daily dose of 19 mg.. (Patient not taking: Reported on 05/30/2023) 270 tablet 3     No current facility-administered medications for this visit.       Allergies   Allergen Reactions    Iodinated Contrast Media Swelling, Rash and Other (See Comments)     Burning, warmth throughout body and tingling.  Throat swelling.  Treated with benadryl  and symptoms improved.  Has not had contrast since then (2013 or 2014).    Burning    Nickel Rash    Privigen  [Immun Glob G(Igg)-Pro-Iga 0-50] Muscle Pain     PRIVIGEN  brand: back and leg pain at usual rate of infusion during first dose, then full body pain again when rechallenged at slower rate of 30 mL/hr    Propranolol Other (See Comments) and Swelling    Eye Irrigating Solution [Ophthalmic Irrigation Solution] Other (See Comments)     Contrast dye (name?) used in eyes caused hot feeling in face, reversed with Benadryl .     Iodine      Other reaction(s): Skin Rash    Naltrexone Rash    Uni-Cortrom Rash       REVIEW OF SYSTEMS: See HPI; remaining balance of 10 systems are negative/non-contributory.    OBJECTIVE     BP 138/69 (BP Site: L Arm, BP Position: Sitting, BP Cuff Size: Medium)  - Pulse 89  - Wt 70.2 kg (154 lb 12.8 oz)  - LMP 05/26/2023 (Exact Date)  - BMI 25.76 kg/m??   General: well-appearing and in NAD  Lymph:  no inguinal lymphadenopathy  Abdomen:  soft, NT  Pelvic:   IUD strings visible, uterus is non-tender, no CMT, no adnexal tenderness  Psych: pleasant and interactive, answers questions appropriately     A chaperone was present for any sensitive portions of the exam (if performed) including but not limited to, breast and pelvic examination.  If performed, consent was obtained from patient prior to the sensitive examination.      LABS     Results for orders placed or performed in visit on 05/07/23   Surgical pathology exam   Result Value Ref Range    Diagnosis       Endometrium,  biopsy:  -Inactive endometrium with focal stromal breakdown, negative for hyperplasia or carcinoma.    This electronic signature is attestation that the pathologist personally reviewed the submitted material(s) and the final diagnosis reflects that evaluation.      Clinical History       N93.9 Abnormal uterine bleeding      Gross Description       Received in formalin.  Label: Endometrium tissue; endometrial biopsy.  Size 2.5 x 1.0 x 0.3 cm aggregate.  Appearance: Clotted and mucoid material.  All 1.           Microscopic Description       Microscopic examination substantiates the above diagnosis.      EMBEDDED IMAGES       *Note: Due to a large number of results and/or encounters for the requested time period, some results have not been displayed. A complete set of results can be found in Results Review.

## 2023-06-05 NOTE — Unmapped (Signed)
 [facility] Pharm[facility] Refill Coordination Note    Elaine Koch, [place]: [birth year]  Phone: There are no phone numbers on file.      All above HIPAA information was verified with patient.         06/04/2023     1:54 PM   Specialty Rx Medication Refill Questionnaire   Which Medications would you like refilled and shipped? Folic acid , Remron, Envarus 4mg , Sodium bicarbonate  , Myfortic    Please list all current allergies: No changes   Have you missed any doses in the last 30 days? No   Have you had any changes to your medication(s) since your last refill? No   How many days remaining of each medication do you have at home? 3-4   Have you experienced any side effects in the last 30 days? No   Please enter the full address (street address, city, state, zip code) where you would like your medication(s) to be delivered to. [address], [place] [address]   Please specify on which day you would like your medication(s) to arrive. Note: if you need your medication(s) within 3 days, please call the pharmacy to schedule your order at [phone]  06/06/2023   Has your insurance changed since your last refill? No   Would you like a pharmacist to call you to discuss your medication(s)? No   Do you require a signature for your package? (Note: if we are billing Medicare Part B or your order contains a controlled substance, we will require a signature) Yes   I have been provided my out of pocket cost for my medication and approve the pharmacy to charge the amount to my credit card on file. Yes         Completed refill call assessment today to schedule patient's medication shipment from the [facility] [phone]).  All relevant notes have been reviewed.       Confirmed patient received a Conservation officer, historic buildings and a Surveyor, mining with first shipment. The patient will receive a drug information handout for each medication shipped and additional FDA Medication Guides as required.         REFERRAL TO PHARMACIST     Referral to the pharmacist: Not needed      [facility], [place]     Shipping address confirmed in Epic.     Delivery Scheduled: Yes, Expected medication delivery date: 06/07/2023.     Medication will be delivered via UPS to the prescription address in Epic WAM.    [facility] Specialty Technician

## 2023-06-06 MED FILL — MYCOPHENOLATE SODIUM 180 MG TABLET,DELAYED RELEASE: ORAL | 30 days supply | Qty: 120 | Fill #1

## 2023-06-06 MED FILL — FOLIC ACID 1 MG TABLET: ORAL | 30 days supply | Qty: 30 | Fill #1

## 2023-06-23 DIAGNOSIS — Z940 Kidney transplant status: Principal | ICD-10-CM

## 2023-06-23 NOTE — Unmapped (Signed)
 Clinical research associate called to complete [facility] questionnaire for provider [name] for appt on 05/13 at 8:15 am a avoicemail was left and patient was also informed that this questionnaire is available on mychart if patient calls back please coomplete questionniare.
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
## 2023-06-24 ENCOUNTER — Encounter: Admit: 2023-06-24 | Discharge: 2023-06-25 | Payer: MEDICARE

## 2023-06-24 DIAGNOSIS — F3341 Major depressive disorder, recurrent, in partial remission: Principal | ICD-10-CM

## 2023-06-24 DIAGNOSIS — F439 Reaction to severe stress, unspecified: Principal | ICD-10-CM

## 2023-06-24 MED ORDER — ESCITALOPRAM 10 MG TABLET
ORAL_TABLET | Freq: Every day | ORAL | 2 refills | 30.00000 days | Status: CP
Start: 2023-06-24 — End: 2023-09-22
  Filled 2023-08-18: qty 30, 30d supply, fill #0

## 2023-06-24 MED ORDER — MIRTAZAPINE 15 MG TABLET
ORAL_TABLET | Freq: Every evening | ORAL | 2 refills | 30.00000 days | Status: CP
Start: 2023-06-24 — End: 2023-09-22

## 2023-06-24 NOTE — Unmapped (Signed)
 Follow-up instructions:  - Please continue taking your medications as prescribed for your mental health.   - Do not make changes to your medications, including taking more or less than prescribed, unless under the supervision of your physician. Be aware that some medications may make you feel worse if abruptly stopped  - Please refrain from using illicit substances, as these can affect your mood and could cause anxiety or other concerning symptoms.   - Seek further medical care for any increase in symptoms or new symptoms such as thoughts of wanting to hurt yourself or hurt others.     Contact info:  Life-threatening emergencies: Call 911, the 988 suicide and crisis lifeline, or go to the nearest ER for medical or psychiatric attention.           Issues that need urgent attention but are not life threatening: Call the clinic outpatient front desk for assistance:    - [phone] for [facility] adult psychiatry clinics located at [address] [facility]  - [phone] for [facility] child and adolescent psychiatry clinics located at [address]  - [phone] for [facility] and adult psychiatry clinics located at [address]    Non-urgent routine concerns and questions: Send a message through [facility] or call our clinic front desk.    Refill requests: Check with your pharmacy to initiate refill requests.    Regarding appointments:  - If you need to cancel your appointment, we ask that you call your clinic at least 24 hours before your scheduled appointment at the numbers listed above.  - If for any reason you arrive 15 minutes later than your scheduled appointment time, you may not be seen and your visit may be rescheduled.  - Please remember that we will not automatically reschedule missed appointments.  - If you miss two (2) appointments without letting us know in advance, you will likely be referred to a provider in your community.  - We will do our best to be on time. Sometimes an emergency will arise that might cause your clinician to be late. We will try to inform you of this when you check in for your appointment. If you wait more than 15 minutes past your appointment time without such notice, please speak with the front desk staff.    In the event of bad weather, the clinic staff will attempt to contact you, should your appointment need to be rescheduled. Additionally, you can call the Patient Weather Line [phone] for system-wide clinic status    For more information and reminders regarding clinic policies (these were provided when you were admitted to the clinic), please ask the front desk.

## 2023-06-25 NOTE — Unmapped (Signed)
 This was a telehealth service and I was in person with the resident.  As the attending physician, I spent 20 minutes in medical discussion with the patient via real-time audio and video, participating in the key portions of the service.  I spent an additional 20 minutes on pre- and post-visit activities which were specific to the patient and included reviewing the patient???s medical records, lab results, imaging results, and other pertinent records. I reviewed the resident's note and I agree with the resident's findings and plan.      Maralyn Sago, MD

## 2023-07-10 MED FILL — FOLIC ACID 1 MG TABLET: ORAL | 30 days supply | Qty: 30 | Fill #2

## 2023-07-15 NOTE — Unmapped (Signed)
 The [facility] has made a second and final attempt to reach this patient to refill the following medication:mycophenolate 180 MG EC tablet (MYFORTIC).      We have left voicemails on the following phone numbers: [phone], have sent a MyChart message, have sent a text message to the following phone numbers: [phone], and have sent a Mychart questionnaire..    Dates contacted: 07/11/2023 - 07/15/2023  Last scheduled delivery: 06/06/2023    The patient may be at risk of non-compliance with this medication. The patient should call the [facility] at [phone]  Option 4, then Option 4: Infectious Disease, Transplant to refill medication.    Arlester Bence   [facility]

## 2023-07-21 DIAGNOSIS — Z940 Kidney transplant status: Principal | ICD-10-CM

## 2023-08-14 DIAGNOSIS — N939 Abnormal uterine and vaginal bleeding, unspecified: Principal | ICD-10-CM

## 2023-08-14 DIAGNOSIS — F419 Anxiety disorder, unspecified: Principal | ICD-10-CM

## 2023-08-14 DIAGNOSIS — L309 Dermatitis, unspecified: Principal | ICD-10-CM

## 2023-08-14 DIAGNOSIS — F32A Anxiety and depression: Principal | ICD-10-CM

## 2023-08-14 DIAGNOSIS — J302 Other seasonal allergic rhinitis: Principal | ICD-10-CM

## 2023-08-14 DIAGNOSIS — R0981 Nasal congestion: Principal | ICD-10-CM

## 2023-08-14 MED ORDER — CETIRIZINE 10 MG TABLET
ORAL_TABLET | Freq: Every day | ORAL | 2 refills | 30.00000 days | Status: CP
Start: 2023-08-14 — End: 2024-08-13
  Filled 2023-08-18: qty 30, 30d supply, fill #0

## 2023-08-14 MED ORDER — FLUTICASONE PROPIONATE 50 MCG/ACTUATION NASAL SPRAY,SUSPENSION
Freq: Every day | NASAL | 0 refills | 120.00000 days | Status: CP
Start: 2023-08-14 — End: 2023-09-13
  Filled 2023-08-18: qty 32, 60d supply, fill #0

## 2023-08-14 NOTE — Unmapped (Signed)
 Patient stated that she has bene having headaches and  pain on the right side of face. She is having pain in her right ear and  at the back of her teeth. She has been have some congestion as well

## 2023-08-14 NOTE — Unmapped (Signed)
 It was great to see you today    For your sinus congestion please try Cetrizine and flonase nasal spray. If you are still having significant congestion or ear pain after this, please come in for an in person visit to evaluate for an infection.    For your eczema please use moisturizing lotion like your eucerin cream a few times a day. If your eczema persists, please try using the hydrocortisone  daily for 2 weeks. If it is still bothering you at this point please let me know.    For your bleeding, please continue to follow up with OBGYN.

## 2023-08-14 NOTE — Unmapped (Signed)
 Internal Medicine Video Visit    This visit is conducted via video conferencing.    Contact Information  Person Contacted: Patient  Contact Phone number: There are no phone numbers on file.  Is there someone else in the room? No.   Patient agreed to a video visit    Elaine Koch is a 38 y.o. female  participating in a video visit.    Reason for visit:    Follow up    Subjective:  She notes she has been waking up with congestion with some intermittent ear pain, facial pain, and tooth pain. She notes it has been going on for about two weeks. She has been taking medicine to try to help. Benadryl  was very helpful but it made her drowsy. She feels like it originates in her sinuses. She denies fever. She notes that her sinus drainage is clear and thin. She does not feel like she has a sinus infection but is unsure. She used to take flonase and notes it was helpful but she is not taking it at this time.     She notes some swelling in her ankles for the last week. She is due for her labs to be checked for her kidney and is planning to go in next week.     She got a new job working in [place]    She notes her mental health is going better now as she has followed with psychiatry. She had been taking mirtazapine  but noted she was eating much more than she felt like was needed and was getting stomachaches. She is just taking the escitalopram  now and feels great. She has also benefitted from getting a new job that allows her to work from home rather than commuting from [place] to [place].      She notes her mesntrual bleeding has been doing ok. She has had some intermittent break through bleeding but has not regular periods or heavy menses.     She has noticed some dry skin on her elbow like eczema She has used hydrocortisone  2-3 times a week and intermittent ecuerin cream.     Objective:  General: Well-appearing woman in no acute distress  Pulm: Breathing comfortably on room air  Skin: Dark patchy discoloration over her right elbow    PHQ-9 Score:  PHQ-9 Total Score: 6  GAD-7 Score:         Assessment & Plan:  1. Seasonal allergic rhinitis, unspecified trigger    2. Sinus congestion    3. Vaginal bleeding    4. Eczema, unspecified type    5. Anxiety and depression           Sinus Congestion  Elaine Koch notes 2 weeks of increased sinus congestion with intermittent right ear pain and some jaw pain.  She notes she had benefited from taking Benadryl  but it made her more fatigued during the week.  She has had no fevers or increased thickness of her sinus drainage.  We discussed today trialing treatment for allergic rhinitis as she notes she has had this in the past and benefited from [facility].  If refractory noted that we would likely need an in person appointment for further evaluation of possible bacterial sinusitis.  - Start Zyrtec 10 mg daily  - Start fluticasone 2 sprays to each nare daily    2.  Depression - Anxiety  Mood much improved after establishing with psychiatry with PHQ-9 down to 6 today. She has continue lexapro  10 mg since our last  visit and self-discontinued mirtazapine  as she found minimal mood benefit and disliked the increased eating she was doing while taking it. Will continue current regimen.   - Continue Lexapro  10 mg daily  - Stopped mirtazapine  15 mg nightly  - Continue following with psychiatry    3. Vaginal Bleeding   Elaine Koch was recently hospitalized after heavy vaginal bleeding.  The consult note while inpatient with OB/GYN and noted that is likely multifactorial in a transvaginal ultrasound performed and showed a small fibroid.  She was started on Provera  taper which she had continue until and IUD was placed. She notes now only some intermittent spotting since IUD placement.  -Follow up with OBGYN as scheduled    4. Eczema  Elaine Koch notes several weeks of itching and darkened skin over her right elbow similar to past eczema. Encouraged use of emollients multiple times a day with consideration of a 2 week course of hydrocortisone  she has on hand if persistent. If refractory encouraged myChart follow up for higher potency steroid  -Applie emollient 3-4 x a day to affected area  -If refractory encouraged applying hydrocortisone  cream daily for two weeks       Return in about 3 months (around 11/14/2023).       Staffed with Dr. Cloria, seen and discussed      The patient reports they are physically located in [place]  and is currently: at home. I conducted a audio/video visit. I spent  43m 07s on the video call with the patient. I spent an additional 10 minutes on pre- and post-visit activities on the date of service .

## 2023-08-17 NOTE — Unmapped (Signed)
 I saw and evaluated the patient, participating in the key portions of the service.  I reviewed the resident???s note.  I agree with the resident???s findings and plan. Ambrose Finland, MD

## 2023-08-18 DIAGNOSIS — Z940 Kidney transplant status: Principal | ICD-10-CM

## 2023-08-18 MED FILL — SODIUM BICARBONATE 650 MG TABLET: ORAL | 50 days supply | Qty: 200 | Fill #1

## 2023-08-19 LAB — CBC W/ DIFFERENTIAL
BANDED NEUTROPHILS ABSOLUTE COUNT: 0 x10E3/uL (ref 0.0–0.1)
BASOPHILS ABSOLUTE COUNT: 0 x10E3/uL (ref 0.0–0.2)
BASOPHILS RELATIVE PERCENT: 1 %
EOSINOPHILS ABSOLUTE COUNT: 0.3 x10E3/uL (ref 0.0–0.4)
EOSINOPHILS RELATIVE PERCENT: 7 %
HEMATOCRIT: 35.8 % (ref 34.0–46.6)
HEMOGLOBIN: 11.8 g/dL (ref 11.1–15.9)
IMMATURE GRANULOCYTES: 0 %
LYMPHOCYTES ABSOLUTE COUNT: 1 x10E3/uL (ref 0.7–3.1)
LYMPHOCYTES RELATIVE PERCENT: 24 %
MEAN CORPUSCULAR HEMOGLOBIN CONC: 33 g/dL (ref 31.5–35.7)
MEAN CORPUSCULAR HEMOGLOBIN: 32.9 pg (ref 26.6–33.0)
MEAN CORPUSCULAR VOLUME: 100 fL — ABNORMAL HIGH (ref 79–97)
MONOCYTES ABSOLUTE COUNT: 0.3 x10E3/uL (ref 0.1–0.9)
MONOCYTES RELATIVE PERCENT: 6 %
NEUTROPHILS ABSOLUTE COUNT: 2.7 x10E3/uL (ref 1.4–7.0)
NEUTROPHILS RELATIVE PERCENT: 62 %
PLATELET COUNT: 178 x10E3/uL (ref 150–450)
RED BLOOD CELL COUNT: 3.59 x10E6/uL — ABNORMAL LOW (ref 3.77–5.28)
RED CELL DISTRIBUTION WIDTH: 12 % (ref 11.7–15.4)
WHITE BLOOD CELL COUNT: 4.3 x10E3/uL (ref 3.4–10.8)

## 2023-08-20 LAB — RENAL FUNCTION PANEL
ALBUMIN: 4.2 g/dL (ref 3.9–4.9)
BLOOD UREA NITROGEN: 38 mg/dL — ABNORMAL HIGH (ref 6–20)
BUN / CREAT RATIO: 17 (ref 9–23)
CALCIUM: 9.5 mg/dL (ref 8.7–10.2)
CHLORIDE: 108 mmol/L — ABNORMAL HIGH (ref 96–106)
CO2: 18 mmol/L — ABNORMAL LOW (ref 20–29)
CREATININE: 2.24 mg/dL — ABNORMAL HIGH (ref 0.57–1.00)
EGFR: 29 mL/min/1.73 — ABNORMAL LOW
GLUCOSE: 107 mg/dL — ABNORMAL HIGH (ref 70–99)
PHOSPHORUS, SERUM: 3.4 mg/dL (ref 3.0–4.3)
POTASSIUM: 4.8 mmol/L (ref 3.5–5.2)
SODIUM: 139 mmol/L (ref 134–144)

## 2023-08-20 LAB — MAGNESIUM: MAGNESIUM: 1.7 mg/dL (ref 1.6–2.3)

## 2023-08-21 LAB — TACROLIMUS LEVEL: TACROLIMUS BLOOD: 8.4 ng/mL (ref 5.0–20.0)

## 2023-09-11 NOTE — Unmapped (Signed)
 [facility] Refill Coordination Note    Specialty Medication(s) to be Shipped:   Transplant: Envarsus  4mg  and mycophenolate  mofetil 180mg     Other medication(s) to be shipped: escitalopram  oxalate 10 MG tablet (LEXAPRO )     Elaine Koch, DOB: [birth year]  Phone: There are no phone numbers on file.      All above HIPAA information was verified with patient.     Was a Nurse, learning disability used for this call? No    Completed refill call assessment today to schedule patient's medication shipment from the [facility]  [phone]).  All relevant notes have been reviewed.     Specialty medication(s) and dose(s) confirmed: Regimen is correct and unchanged.   Changes to medications: Ellison reports no changes at this time.  Changes to insurance: No  New side effects reported not previously addressed with a pharmacist or physician: None reported  Questions for the pharmacist: No    Confirmed patient received a Conservation officer, historic buildings and a Surveyor, mining with first shipment. The patient will receive a drug information handout for each medication shipped and additional FDA Medication Guides as required.       DISEASE/MEDICATION-SPECIFIC INFORMATION        N/A    SPECIALTY MEDICATION ADHERENCE     Medication Adherence    Patient reported X missed doses in the last month: 0  Specialty Medication: ENVARSUS  XR 4 mg Tb24 extended release tablet (tacrolimus )  Patient is on additional specialty medications: Yes  Additional Specialty Medications: mycophenolate  180 MG EC tablet (MYFORTIC )  Patient Reported Additional Medication X Missed Doses in the Last Month: 0              Were doses missed due to medication being on hold? No      ENVARSUS  XR 4 mg Tb24 extended release tablet (tacrolimus ): 3 days of medicine on hand     mycophenolate  180 MG EC tablet (MYFORTIC ): 1 days of medicine on hand       REFERRAL TO PHARMACIST     Referral to the pharmacist: Not needed      [facility] Shipping address confirmed in Epic.     Cost and Payment: Patient has a copay of $1.55. They are aware and have authorized the pharmacy to charge the credit card on file.    Delivery Scheduled: Yes, Expected medication delivery date: 09/15/2023.     Medication will be delivered via UPS to the prescription address in Epic WAM.    [facility]  Specialty Technician

## 2023-09-12 DIAGNOSIS — Z940 Kidney transplant status: Principal | ICD-10-CM

## 2023-09-12 MED ORDER — MYCOPHENOLATE SODIUM 180 MG TABLET,DELAYED RELEASE
ORAL_TABLET | Freq: Two times a day (BID) | ORAL | 0 refills | 4.00000 days | Status: CP
Start: 2023-09-12 — End: 2023-09-16
  Filled 2023-09-12: qty 120, 30d supply, fill #2

## 2023-09-12 MED FILL — ENVARSUS XR 4 MG TABLET,EXTENDED RELEASE: ORAL | 90 days supply | Qty: 360 | Fill #1

## 2023-09-12 MED FILL — ESCITALOPRAM 10 MG TABLET: ORAL | 30 days supply | Qty: 30 | Fill #1

## 2023-09-12 NOTE — Unmapped (Signed)
 Received message that patient was out of her myfortic .  She took her last dose this morning.  Short supply sent to local pharmacy and pt instructed she needs to call to ensue med is in stock and ask for good Rx for coverage

## 2023-09-15 DIAGNOSIS — Z940 Kidney transplant status: Principal | ICD-10-CM

## 2023-09-16 DIAGNOSIS — D708 Other neutropenia: Principal | ICD-10-CM

## 2023-09-29 DIAGNOSIS — Z79899 Other long term (current) drug therapy: Principal | ICD-10-CM

## 2023-09-29 DIAGNOSIS — Z940 Kidney transplant status: Principal | ICD-10-CM

## 2023-09-29 DIAGNOSIS — E119 Type 2 diabetes mellitus without complications: Principal | ICD-10-CM

## 2023-09-29 NOTE — Unmapped (Signed)
 Elaine Koch has been contacted in regards to their refill of mycophenolate  180 MG EC tablet (MYFORTIC ). At this time, they have declined refill due to patient has to much. Refill assessment call date has been updated per the patient's request.

## 2023-09-30 DIAGNOSIS — N760 Acute vaginitis: Principal | ICD-10-CM

## 2023-09-30 DIAGNOSIS — B9689 Other specified bacterial agents as the cause of diseases classified elsewhere: Principal | ICD-10-CM

## 2023-09-30 MED ORDER — METRONIDAZOLE 0.75 % (37.5 MG/5 GRAM) VAGINAL GEL
Freq: Every day | VAGINAL | 0 refills | 5.00000 days | Status: CP
Start: 2023-09-30 — End: 2023-10-05
  Filled 2023-10-02: qty 70, 5d supply, fill #0

## 2023-10-06 ENCOUNTER — Ambulatory Visit (HOSPITAL_COMMUNITY): Payer: Self-pay

## 2023-10-06 ENCOUNTER — Ambulatory Visit (INDEPENDENT_AMBULATORY_CARE_PROVIDER_SITE_OTHER)

## 2023-10-06 ENCOUNTER — Encounter (HOSPITAL_COMMUNITY): Payer: Self-pay

## 2023-10-06 ENCOUNTER — Encounter (HOSPITAL_COMMUNITY): Payer: Self-pay | Admitting: Emergency Medicine

## 2023-10-06 ENCOUNTER — Ambulatory Visit (HOSPITAL_COMMUNITY)
Admission: EM | Admit: 2023-10-06 | Discharge: 2023-10-06 | Disposition: A | Discharge status: Home / Self Care | Attending: Emergency Medicine | Admitting: Emergency Medicine

## 2023-10-06 DIAGNOSIS — U071 COVID-19: Secondary | ICD-10-CM

## 2023-10-06 DIAGNOSIS — R051 Acute cough: Secondary | ICD-10-CM

## 2023-10-06 LAB — POC SARS CORONAVIRUS 2 AG -  ED: SARS Coronavirus 2 Ag: POSITIVE — AB

## 2023-10-06 MED ORDER — ONDANSETRON 4 MG PO TBDP: 4.0000 mg | ORAL_TABLET | Freq: Three times a day (TID) | ORAL | 0 refills | Status: AC | PRN

## 2023-10-06 MED ORDER — ACETAMINOPHEN 325 MG PO TABS
ORAL_TABLET | ORAL | Status: AC
  Filled 2023-10-06: qty 3

## 2023-10-06 MED ORDER — ACETAMINOPHEN 325 MG PO TABS
975.0000 mg | ORAL_TABLET | Freq: Once | ORAL | Status: AC
  Administered 2023-10-06: 975 mg via ORAL

## 2023-10-06 MED ORDER — BENZONATATE 100 MG PO CAPS: 100.0000 mg | ORAL_CAPSULE | Freq: Three times a day (TID) | ORAL | 0 refills | Status: AC

## 2023-10-06 MED ORDER — MOLNUPIRAVIR 200 MG PO CAPS: 4.0000 | ORAL_CAPSULE | Freq: Two times a day (BID) | ORAL | 0 refills | Status: AC

## 2023-10-06 NOTE — ED Provider Notes (Signed)
 [facility]    CSN: [id number] Arrival date & time: 10/06/23  9160      History   Chief Complaint Chief Complaint  Patient presents with   Headache   Cough    HPI Christine Reed is a 38 y.o. female.   Patient presents with cough, congestion, sore throat, headache, body aches,  and chills that began on 8/22.  Patient also endorses some intermittent central chest pain that radiates to her back.  Patient states that this does not worsen with cough or deep breathing.  Denies shortness of breath.  Patient states that she has also had some nausea and vomiting occasionally.  Denies diarrhea, abdominal pain, and blood in vomit or stool.  Patient denies any known sick exposures.  Patient reports she has been taking Tylenol , Benadryl , and TheraFlu with minimal relief. Patient does have a history of type 1 diabetes, end-stage renal disease, hypertension, and GERD.  The history is provided by the patient and medical records.  Headache Associated symptoms: cough   Cough Associated symptoms: headaches     Past Medical History:  Diagnosis Date   Anemia    C. difficile diarrhea    Chronic kidney disease    Pt is on dialysis   Depression    Diabetes mellitus    diagnosed at age 38; Type 1   Diabetic retinopathy (HCC)    GERD (gastroesophageal reflux disease)    Headache    Hypertension    Sickle cell trait (HCC)     Patient Active Problem List   Diagnosis Date Noted   ESRD (end stage renal disease) (HCC) 01/06/2019   Abdominal pain 01/06/2019   HTN (hypertension) 01/06/2019   Chronic migraine 05/08/2017   Acute intractable headache 05/08/2017   Sepsis (HCC) 02/22/2017   ESRD on hemodialysis (HCC) 02/22/2017   Uremia 02/22/2017   DKA (diabetic ketoacidosis) (HCC) 02/22/2017   Hyperkalemia    DKA, type 1 (HCC) 03/01/2016   Nausea & vomiting 03/01/2016   Flu-like symptoms 03/01/2016   Nephrotic syndrome 10/17/2015   Ascites 10/17/2015   Symptomatic anemia  09/11/2015   Anemia affecting pregnancy in second trimester 08/12/2015   Diabetic nephropathy (HCC) 08/07/2015   CKD (chronic kidney disease) stage 3, GFR 30-59 ml/min (HCC) 08/07/2015   Diabetic retinopathy (HCC) 07/24/2015   Chronic hypertension during pregnancy, antepartum 07/22/2015   Anemia, chronic disease 07/17/2015   T1DM - Type F 06/16/2015   Supervision of high risk pregnancy, antepartum 06/16/2015   Type 1 diabetes mellitus with ketoacidosis without coma (HCC) 02/19/2012    Past Surgical History:  Procedure Laterality Date   AV FISTULA PLACEMENT Right 10/10/2016   Procedure: CREATION OF RIGHT ARM RADIOCEPHALIC ARTERIOVENOUS (AV) FISTULA;  Surgeon: Serene Gaile ORN, MD;  Location: [facility] OR;  Service: Vascular;  Laterality: Right;   CESAREAN SECTION N/A 10/19/2015   Procedure: CESAREAN SECTION;  Surgeon: Ozell LITTIE Cowman, MD;  Location: [facility] SU[facility];  Service: Obstetrics;  Laterality: N/A;   KIDNEY TRANSPLANT     lipo suction  2015   TONSILLECTOMY     TONSILLECTOMY AND ADENOIDECTOMY      OB History     Gravida  2   Para  1   Term      Preterm  1   AB      Living  1      SAB      IAB      Ectopic      Multiple  0  Live Births               Home Medications    Prior to Admission medications   Medication Sig Start Date End Date Taking? Authorizing Provider  benzonatate  (TESSALON ) 100 MG capsule Take 1 capsule (100 mg total) by mouth every 8 (eight) hours. 10/06/23  Yes Johnie Flaming A, NP  molnupiravir  EUA (LAGEVRIO ) 200 MG CAPS capsule Take 4 capsules (800 mg total) by mouth 2 (two) times daily for 5 days. 10/06/23 10/11/23 Yes Johnie Flaming A, NP  ondansetron  (ZOFRAN -ODT) 4 MG disintegrating tablet Take 1 tablet (4 mg total) by mouth every 8 (eight) hours as needed for nausea or vomiting. 10/06/23  Yes Johnie Flaming A, NP  amLODipine  (NORVASC ) 10 MG tablet Take 1 tablet (10 mg total) by mouth daily. 01/07/19   Cheryle Page, MD  aspirin   EC 81 MG tablet Take 81 mg by mouth daily. Swallow whole.    [provider]  azaTHIOprine (IMURAN) 50 MG tablet Take 50 mg by mouth in the morning and at bedtime.    [provider]  Biotin 1000 MCG CHEW Chew by mouth.    [provider]  escitalopram  (LEXAPRO ) 5 MG tablet Take 1 tablet (5 mg total) by mouth daily. 08/29/22   McQuilla, Jai B, MD  glucagon  (GLUCAGON  EMERGENCY) 1 MG injection Inject 1 mg into the skin once as needed for up to 1 dose. Inject 1mg  into skin once as needed for low blood sugar. 06/03/17   Von Pacific, MD  insulin  aspart (NOVOLOG ) 100 UNIT/ML injection Inject 0-20 Units into the skin 3 (three) times daily before meals. Sliding scale 09/10/18   Von [place], MD  Insulin  Detemir (LEVEMIR  FLEXTOUCH) 100 UNIT/ML Pen INJECT 10 UNITS INTO THE SKIN IN THE MORNING AND INJECT 11 UNITS INTO THE SKIN IN THE EVENING. Patient taking differently: Inject 10-11 Units into the skin See admin instructions. Inject 10 units in the morning and 11 units in the evening. 09/10/18   Von [place], MD  loperamide  (IMODIUM ) 2 MG capsule Take 1 capsule (2 mg total) by mouth 4 (four) times daily as needed for diarrhea or loose stools. 05/03/22   Adolph Tinnie BRAVO, PA-C  losartan  (COZAAR ) 50 MG tablet Take 50 mg by mouth at bedtime. 12/11/18   [provider]  metoprolol  succinate (TOPROL -XL) 50 MG 24 hr tablet Take 50 mg by mouth daily. 07/22/18   [provider]  metroNIDAZOLE  (FLAGYL ) 500 MG tablet Take 500 mg by mouth 2 (two) times daily. 02/16/19   [provider]  ondansetron  (ZOFRAN ) 4 MG tablet Take 1 tablet (4 mg total) by mouth every 6 (six) hours. 04/06/22   Kingsley, Victoria K, DO  promethazine -dextromethorphan  (PROMETHAZINE -DM) 6.25-15 MG/5ML syrup Take 5 mLs by mouth 4 (four) times daily as needed for cough. 02/23/19   Pisciotta, Nat, PA-C  sevelamer (RENAGEL) 800 MG tablet Take 2,400 mg by mouth See admin instructions. Take 2400mg  three times daily  with each meal and snacks 04/10/17   [provider]  sodium bicarbonate  650 MG tablet Take 1,300 mg by mouth 3 (three) times daily.    [provider]  Tacrolimus  ER (ENVARSUS  XR) 4 MG TB24 Take 16 mg by mouth.    [provider]  buPROPion  (WELLBUTRIN  XL) 150 MG 24 hr tablet Take 150 mg daily by mouth.  11/10/18  [provider]    Family History Family History  Problem Relation Age of Onset   Diabetes Mother  Hypertension Mother    Alcohol abuse Father    Diabetes Father    Hypertension Father    Sickle cell trait Father    Alcohol abuse Paternal Aunt    Alcohol abuse Paternal Uncle    Diabetes Maternal Grandfather    Hypertension Maternal Grandfather    Cancer Maternal Grandmother        colon and breast   Hypertension Maternal Grandmother    Breast cancer Maternal Grandmother    Alcohol abuse Paternal Grandfather    Diabetes Paternal Grandfather    Hypertension Paternal Grandfather    Diabetes Paternal Grandmother    Hypertension Paternal Grandmother     Social History Social History   Tobacco Use   Smoking status: Never   Smokeless tobacco: Never  Vaping Use   Vaping status: Never Used  Substance Use Topics   Alcohol use: Yes    Comment: very rarely   Drug use: No     Allergies   Ivp dye [iodinated contrast media], Propranolol  hcl er, Iodine, and Nickel   Review of Systems Review of Systems  Respiratory:  Positive for cough.   Neurological:  Positive for headaches.   Per HPI  Physical Exam Triage Vital Signs ED Triage Vitals  Encounter Vitals Group     BP 10/06/23 0935 138/79     Girls Systolic BP Percentile --      Girls Diastolic BP Percentile --      Boys Systolic BP Percentile --      Boys Diastolic BP Percentile --      Pulse Rate 10/06/23 0935 86     Resp 10/06/23 0935 17     Temp 10/06/23 0935 100 F (37.8 C)     Temp Source 10/06/23 0935 Oral     SpO2 10/06/23 0935 97 %     Weight --      Height  --      Head Circumference --      Peak Flow --      Pain Score 10/06/23 0934 8     Pain Loc --      Pain Education --      Exclude from Growth Chart --    No data found.  Updated Vital Signs BP 138/79 (BP Location: Left Arm)   Pulse 86   Temp 100 F (37.8 C) (Oral)   Resp 17   LMP 10/03/2023 (Exact Date)   SpO2 97%   Visual Acuity Right Eye Distance:   Left Eye Distance:   Bilateral Distance:    Right Eye Near:   Left Eye Near:    Bilateral Near:     Physical Exam Vitals and nursing note reviewed.  Constitutional:      General: She is awake. She is not in acute distress.    Appearance: Normal appearance. She is well-developed and well-groomed. She is ill-appearing. She is not toxic-appearing or diaphoretic.  HENT:     Right Ear: Tympanic membrane, ear canal and external ear normal.     Left Ear: Tympanic membrane, ear canal and external ear normal.     Nose: Congestion and rhinorrhea present.     Mouth/Throat:     Mouth: Mucous membranes are moist.     Pharynx: Posterior oropharyngeal erythema present. No oropharyngeal exudate.  Cardiovascular:     Rate and Rhythm: Normal rate and regular rhythm.  Pulmonary:     Effort: Pulmonary effort is normal.     Breath sounds: Normal breath sounds.  Chest:  Chest wall: No tenderness.  Abdominal:     General: Abdomen is flat. Bowel sounds are normal. There is no distension.     Palpations: Abdomen is soft.     Tenderness: There is no abdominal tenderness. There is no right CVA tenderness, left CVA tenderness, guarding or rebound.  Skin:    General: Skin is warm and dry.  Neurological:     General: No focal deficit present.     Mental Status: She is alert and oriented to person, place, and time. Mental status is at baseline.  Psychiatric:        Behavior: Behavior is cooperative.      UC Treatments / Results  Labs (all labs ordered are listed, but only abnormal results are displayed) Labs Reviewed  POC SARS  CORONAVIRUS 2 AG -  ED - Abnormal; Notable for the following components:      Result Value   SARS Coronavirus 2 Ag Positive (*)    All other components within normal limits    EKG   Radiology DG Chest 2 View Result Date: 10/06/2023 CLINICAL DATA:  Cough with chest pain. EXAM: CHEST - 2 VIEW COMPARISON:  12/12/2022. FINDINGS: Trachea is midline. Heart is at the upper limits of normal in size to mildly enlarged. Very mild basilar interstitial prominence. Trace bilateral pleural effusions. IMPRESSION: Suspect mild pulmonary edema with trace bilateral pleural effusions. Electronically Signed   By: Newell Eke M.D.   On: 10/06/2023 11:28    Procedures Procedures (including critical care time)  Medications Ordered in UC Medications  acetaminophen  (TYLENOL ) tablet 975 mg (975 mg Oral Given 10/06/23 0955)    Initial Impression / Assessment and Plan / UC Course  I have reviewed the triage vital signs and the nursing notes.  Pertinent labs & imaging results that were available during my care of the patient were reviewed by me and considered in my medical decision making (see chart for details).     Patient is ill-appearing.  Vitals are stable.  Temperature is elevated at 100.  Congestion and rhinorrhea present, erythema noted to posterior oropharynx.  Lungs clear bilaterally to auscultation.  COVID testing positive.  Ordered chest x-ray to rule out underlying pneumonia related to chest pain.  Based on my interpretation there is no active cardiopulmonary disease at this time.  Radiology report reports suspicion for mild pulmonary edema with trace bilateral pleural effusions.  Spoke with patient and informed her of this result.  Patient still denies any shortness of breath at this time.  Discussed with patient the importance of going to the emergency department if she were to develop any shortness of breath or difficulty breathing.  Recommended following up with primary care provider regarding  this result.  Given Tylenol  in clinic for fever and acute pain.  Most recent GFR in March 2025 was 25.  Deferred Paxlovid due to decreased renal function.  Prescribed molnupiravir .  Prescribed Tessalon  as needed for cough.  Prescribe Zofran  as needed for nausea and vomiting.  Discussed over-the-counter medications as needed for symptoms.  Discussed follow-up and return precautions. Final Clinical Impressions(s) / UC Diagnoses   Final diagnoses:  Acute cough  COVID-19     Discharge Instructions      You tested positive for COVID today. Your x-ray did not reveal any pneumonia today. Start taking molnupiravir  4 capsules twice daily for 5 days. You can take Tessalon  every 8 hours as needed for cough. Otherwise recommend taking over-the-counter Coricidin as needed for cough and congestion.  You can take Zofran  every 8 hours as needed for any nausea or vomiting. Make sure you are staying hydrated getting plenty of rest. You can take 500 to 1000 mg of Tylenol  every 6-8 hours as needed for headache, body aches, and fever.  Do not exceed 4000 mg in 1 day.  Follow-up with your primary care provider or return here as needed.      ED Prescriptions     Medication Sig Dispense Auth. Provider   molnupiravir  EUA (LAGEVRIO ) 200 MG CAPS capsule Take 4 capsules (800 mg total) by mouth 2 (two) times daily for 5 days. 40 capsule Johnie, Williom Cedar A, NP   benzonatate  (TESSALON ) 100 MG capsule Take 1 capsule (100 mg total) by mouth every 8 (eight) hours. 21 capsule Johnie, Craig Ionescu A, NP   ondansetron  (ZOFRAN -ODT) 4 MG disintegrating tablet Take 1 tablet (4 mg total) by mouth every 8 (eight) hours as needed for nausea or vomiting. 10 tablet Johnie Flaming A, NP      PDMP not reviewed this encounter.   Johnie Flaming A, NP 10/06/23 1143

## 2023-10-06 NOTE — Discharge Instructions (Addendum)
 You tested positive for COVID today. Your x-ray did not reveal any pneumonia today. Start taking molnupiravir  4 capsules twice daily for 5 days. You can take Tessalon  every 8 hours as needed for cough. Otherwise recommend taking over-the-counter Coricidin as needed for cough and congestion.  You can take Zofran  every 8 hours as needed for any nausea or vomiting. Make sure you are staying hydrated getting plenty of rest. You can take 500 to 1000 mg of Tylenol  every 6-8 hours as needed for headache, body aches, and fever.  Do not exceed 4000 mg in 1 day.  Follow-up with your primary care provider or return here as needed.

## 2023-10-06 NOTE — ED Triage Notes (Signed)
 Pt reports Friday started off having sore throat and headache. Now having body aches, cough. Pt took Benadryl , Tylenol , Theraflu that hasn't helped.

## 2023-10-13 DIAGNOSIS — D708 Other neutropenia: Principal | ICD-10-CM

## 2023-10-13 DIAGNOSIS — Z940 Kidney transplant status: Principal | ICD-10-CM

## 2023-10-16 DIAGNOSIS — J302 Other seasonal allergic rhinitis: Principal | ICD-10-CM

## 2023-10-16 MED ORDER — FLUTICASONE PROPIONATE 50 MCG/ACTUATION NASAL SPRAY,SUSPENSION
Freq: Every day | NASAL | 0 refills | 120.00000 days
Start: 2023-10-16 — End: 2023-11-15

## 2023-10-21 MED FILL — CETIRIZINE 10 MG TABLET: ORAL | 30 days supply | Qty: 30 | Fill #1

## 2023-10-21 MED FILL — SODIUM BICARBONATE 650 MG TABLET: ORAL | 50 days supply | Qty: 200 | Fill #2

## 2023-10-21 MED FILL — ESCITALOPRAM 10 MG TABLET: ORAL | 30 days supply | Qty: 30 | Fill #2

## 2023-10-23 MED ORDER — FLUTICASONE PROPIONATE 50 MCG/ACTUATION NASAL SPRAY,SUSPENSION
Freq: Every day | NASAL | 0 refills | 120.00000 days | Status: CP
Start: 2023-10-23 — End: 2023-12-22
  Filled 2023-10-23: qty 32, 60d supply, fill #0

## 2023-10-24 NOTE — Unmapped (Signed)
 Elaine Koch has been contacted in regards to their refill of mycophenolate  180 MG EC tablet (MYFORTIC ). At this time, they have declined refill due to just receiving the medication . Refill assessment call date has been updated per the patient's request.
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
## 2023-10-28 DIAGNOSIS — F339 Major depressive disorder, recurrent, unspecified: Principal | ICD-10-CM

## 2023-10-28 DIAGNOSIS — F439 Reaction to severe stress, unspecified: Principal | ICD-10-CM

## 2023-10-28 DIAGNOSIS — F419 Anxiety disorder, unspecified: Principal | ICD-10-CM

## 2023-10-28 MED ORDER — ESCITALOPRAM 10 MG TABLET
ORAL_TABLET | Freq: Every day | ORAL | 2 refills | 30.00000 days | Status: CP
Start: 2023-10-28 — End: 2024-01-26

## 2023-10-28 MED ORDER — MIRTAZAPINE 15 MG TABLET
ORAL_TABLET | ORAL | 0 refills | 30.00000 days | Status: CP
Start: 2023-10-28 — End: 2023-11-27

## 2023-10-28 MED ORDER — HYDROXYZINE HCL 25 MG TABLET
ORAL_TABLET | Freq: Two times a day (BID) | ORAL | 0 refills | 30.00000 days | Status: CP | PRN
Start: 2023-10-28 — End: 2023-11-27

## 2023-10-28 NOTE — Unmapped (Signed)
 This was a telehealth service and I was in person with the resident.  As the attending physician, I spent 7 minutes in medical discussion with the patient via real-time audio and video, participating in the key portions of the service.  I spent an additional 10 minutes on pre- and post-visit activities which were specific to the patient and included reviewing the patient???s medical records, lab results, imaging results, and other pertinent records. I reviewed the resident's note and I agree with the resident's findings and plan.    Pleasant SHAUNNA Hover, MD

## 2023-10-28 NOTE — Unmapped (Signed)
-   It was great to see you today.  - Please START mirtazapine  7.5 mg nightly for two weeks, then INCREASE to 15 mg nightly if you are tolerating it well.  - I will also order a hydroxyzine  25 mg twice daily as needed medication for you in case of severe anxiety.  - Let me know if you have questions or concerns.    Follow-up instructions:  - Please continue taking your medications as prescribed for your mental health.   - Do not make changes to your medications, including taking more or less than prescribed, unless under the supervision of your physician. Be aware that some medications may make you feel worse if abruptly stopped  - Please refrain from using illicit substances, as these can affect your mood and could cause anxiety or other concerning symptoms.   - Seek further medical care for any increase in symptoms or new symptoms such as thoughts of wanting to hurt yourself or hurt others.     Contact info:  Life-threatening emergencies: Call 911, the 988 suicide and crisis lifeline, or go to the nearest ER for medical or psychiatric attention.           Issues that need urgent attention but are not life threatening: Call the clinic outpatient front desk for assistance:    - [phone] for [facility] adult psychiatry clinics located at [address] [facility]  - [phone] for [facility] child and adolescent psychiatry clinics located at [address]  - [phone] for [facility] and adult psychiatry clinics located at [address] [facility]    Non-urgent routine concerns and questions: Send a message through [facility] or call our clinic front desk.    Refill requests: Check with your pharmacy to initiate refill requests.    Regarding appointments:  - If you need to cancel your appointment, we ask that you call your clinic at least 24 hours before your scheduled appointment at the numbers listed above.  - If for any reason you arrive 15 minutes later than your scheduled appointment time, you may not be seen and your visit may be rescheduled.  - Please remember that we will not automatically reschedule missed appointments.  - If you miss two (2) appointments without letting us  know in advance, you will likely be referred to a provider in your community.  - We will do our best to be on time. Sometimes an emergency will arise that might cause your clinician to be late. We will try to inform you of this when you check in for your appointment. If you wait more than 15 minutes past your appointment time without such notice, please speak with the front desk staff.    In the event of bad weather, the clinic staff will attempt to contact you, should your appointment need to be rescheduled. Additionally, you can call the Patient Weather Line [phone] for system-wide clinic status    For more information and reminders regarding clinic policies (these were provided when you were admitted to the clinic), please ask the front desk.

## 2023-10-28 NOTE — Unmapped (Signed)
 Addended by: AGAPITO DOZIER RAMAN on: 10/28/2023 02:23 PM     Modules accepted: Orders

## 2023-11-10 DIAGNOSIS — Z940 Kidney transplant status: Principal | ICD-10-CM

## 2023-11-12 DIAGNOSIS — D708 Other neutropenia: Principal | ICD-10-CM

## 2023-11-17 NOTE — Unmapped (Signed)
 [facility] Refill Coordination Note    Specialty Medication(s) to be Shipped:   Transplant: Envarsus  XR 4 mg and mycophenolate  mofetil 180 mg    Other medication(s) to be shipped: No additional medications requested for fill at this time    Specialty Medications not needed at this time: N/A     Elaine Koch, DOB: 02/06/1986  Phone: There are no phone numbers on file.      All above HIPAA information was verified with patient.     Was a Nurse, learning disability used for this call? No    Completed refill call assessment today to schedule patient's medication shipment from the [facility]  [phone]).  All relevant notes have been reviewed.     Specialty medication(s) and dose(s) confirmed: Regimen is correct and unchanged.   Changes to medications: Elaine Koch reports no changes at this time.  Changes to insurance: No  New side effects reported not previously addressed with a pharmacist or physician: None reported  Questions for the pharmacist: No    Confirmed patient received a Conservation officer, historic buildings and a Surveyor, mining with first shipment. The patient will receive a drug information handout for each medication shipped and additional FDA Medication Guides as required.       DISEASE/MEDICATION-SPECIFIC INFORMATION        N/A    SPECIALTY MEDICATION ADHERENCE     Medication Adherence    Patient reported X missed doses in the last month: 0  Specialty Medication: mycophenolate  180 MG EC tablet (MYFORTIC )  Patient is on additional specialty medications: Yes  Additional Specialty Medications: ENVARSUS  XR 4 mg Tb24 extended release tablet (tacrolimus )  Patient Reported Additional Medication X Missed Doses in the Last Month: 0  Patient is on more than two specialty medications: No              Were doses missed due to medication being on hold? No    mycophenolate  180 MG EC tablet (MYFORTIC ) 4 days of medicine on hand   ENVARSUS  XR 4 mg Tb24 extended release tablet (tacrolimus )  7 days of medicine on hand       REFERRAL TO PHARMACIST     Referral to the pharmacist: Not needed      [facility]     Shipping address confirmed in Epic.     Cost and Payment: Patient has a $0 copay, payment information is not required.    Delivery Scheduled: Yes, Expected medication delivery date: 11/19/2023.     Medication will be delivered via UPS to the prescription address in Epic WAM.    [facility]  Specialty Technician

## 2023-11-18 MED FILL — ENVARSUS XR 4 MG TABLET,EXTENDED RELEASE: ORAL | 90 days supply | Qty: 360 | Fill #2

## 2023-11-18 MED FILL — MYCOPHENOLATE SODIUM 180 MG TABLET,DELAYED RELEASE: ORAL | 30 days supply | Qty: 120 | Fill #3

## 2023-11-24 MED ORDER — ESCITALOPRAM 10 MG TABLET
ORAL_TABLET | Freq: Every day | ORAL | 2 refills | 30.00000 days
Start: 2023-11-24 — End: 2024-02-22

## 2023-11-24 MED FILL — CETIRIZINE 10 MG TABLET: ORAL | 30 days supply | Qty: 30 | Fill #2

## 2023-11-27 MED ORDER — MIRTAZAPINE 15 MG TABLET
ORAL_TABLET | Freq: Every evening | ORAL | 1 refills | 30.00000 days | Status: CP
Start: 2023-11-27 — End: 2024-01-26

## 2023-12-02 ENCOUNTER — Encounter (HOSPITAL_COMMUNITY): Payer: Self-pay

## 2023-12-08 DIAGNOSIS — Z940 Kidney transplant status: Principal | ICD-10-CM

## 2023-12-18 NOTE — Progress Notes (Signed)
 The [facility] has made a second and final attempt to reach this patient to refill the following medication:mycophenolate  180 MG EC tablet (MYFORTIC ).      We have left voicemails on the following phone numbers: [phone], have sent a MyChart message, and have sent a text message to the following phone numbers: [phone].    Dates contacted: 12/10/2023- 12/18/2023  Last scheduled delivery: 11/18/2023    The patient may be at risk of non-compliance with this medication. The patient should call the [facility] at [phone]  Option 4, then Option 4: Infectious Disease, Transplant to refill medication.    Nelida Guan   [facility] and [facility]

## 2023-12-22 DIAGNOSIS — J302 Other seasonal allergic rhinitis: Principal | ICD-10-CM

## 2023-12-22 MED ORDER — FLUTICASONE PROPIONATE 50 MCG/ACTUATION NASAL SPRAY,SUSPENSION
Freq: Every day | NASAL | 11 refills | 120.00000 days | Status: CP
Start: 2023-12-22 — End: 2024-01-21
  Filled 2023-12-23: qty 32, 60d supply, fill #0

## 2023-12-22 MED ORDER — MIRTAZAPINE 15 MG TABLET
ORAL_TABLET | ORAL | 0 refills | 30.00000 days
Start: 2023-12-22 — End: 2024-01-21

## 2023-12-22 NOTE — Addendum Note (Signed)
 Addended by: DEBARAH ROGUE on: 12/22/2023 09:51 PM     Modules accepted: Orders

## 2023-12-23 MED ORDER — ESCITALOPRAM 10 MG TABLET
ORAL_TABLET | Freq: Every day | ORAL | 0 refills | 90.00000 days | Status: CP
Start: 2023-12-23 — End: 2024-03-22

## 2023-12-23 MED FILL — SODIUM BICARBONATE 650 MG TABLET: ORAL | 50 days supply | Qty: 200 | Fill #3

## 2023-12-23 NOTE — Patient Instructions (Addendum)
-   Please restart lexapro  5 mg daily for 1 week, then INCREASE to 10 mg daily.  - STOP mirtazapine  7.5 mg given issues with obtaining medication and concerns for side effects.  - Let me know if you have other questions or concerns.    Follow-up instructions:  - Please continue taking your medications as prescribed for your mental health.   - Do not make changes to your medications, including taking more or less than prescribed, unless under the supervision of your physician. Be aware that some medications may make you feel worse if abruptly stopped  - Please refrain from using illicit substances, as these can affect your mood and could cause anxiety or other concerning symptoms.   - Seek further medical care for any increase in symptoms or new symptoms such as thoughts of wanting to hurt yourself or hurt others.     Contact info:  Life-threatening emergencies: Call 911, the 988 suicide and crisis lifeline, or go to the nearest ER for medical or psychiatric attention.           Issues that need urgent attention but are not life threatening: Call the clinic outpatient front desk for assistance:    - [phone] for [facility] adult psychiatry clinics located at [address] [facility]  - [phone] for [facility] child and adolescent psychiatry clinics located at [address]  - [phone] for [facility] and adult psychiatry clinics located at [address] [facility]    Non-urgent routine concerns and questions: Send a message through [facility] or call our clinic front desk.    Refill requests: Check with your pharmacy to initiate refill requests.    Regarding appointments:  - If you need to cancel your appointment, we ask that you call your clinic at least 24 hours before your scheduled appointment at the numbers listed above.  - If for any reason you arrive 15 minutes later than your scheduled appointment time, you may not be seen and your visit may be rescheduled.  - Please remember that we will not automatically reschedule missed appointments.  - If you miss two (2) appointments without letting us  know in advance, you will likely be referred to a provider in your community.  - We will do our best to be on time. Sometimes an emergency will arise that might cause your clinician to be late. We will try to inform you of this when you check in for your appointment. If you wait more than 15 minutes past your appointment time without such notice, please speak with the front desk staff.    In the event of bad weather, the clinic staff will attempt to contact you, should your appointment need to be rescheduled. Additionally, you can call the Patient Weather Line [phone] for system-wide clinic status    For more information and reminders regarding clinic policies (these were provided when you were admitted to the clinic), please ask the front desk.
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
## 2023-12-24 NOTE — Progress Notes (Signed)
 This was a telehealth service and I was in person with the resident.  As the attending physician, I spent 20 minutes in medical discussion with the patient via real-time audio and video, participating in the key portions of the service.  I spent an additional 25 minutes on pre- and post-visit activities which were specific to the patient and included reviewing the patient???s medical records, lab results, imaging results, and other pertinent records. I reviewed the resident's note and I agree with the resident's findings and plan.      Connee Lucky, MD

## 2024-01-12 DIAGNOSIS — D708 Other neutropenia: Principal | ICD-10-CM
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
## 2024-01-19 NOTE — Telephone Encounter (Signed)
 writer called to complete MSPQ for appt with provider Dozier Lance at 12/09 @ 8am if patient calls back please complete questionnaire

## 2024-01-20 DIAGNOSIS — F439 Reaction to severe stress, unspecified: Principal | ICD-10-CM

## 2024-01-20 DIAGNOSIS — F339 Major depressive disorder, recurrent, unspecified: Principal | ICD-10-CM

## 2024-01-20 MED ORDER — ESCITALOPRAM 10 MG TABLET
ORAL_TABLET | Freq: Every day | ORAL | 0 refills | 90.00000 days | Status: CP
Start: 2024-01-20 — End: 2024-04-19
  Filled 2024-02-17: qty 90, 90d supply, fill #0

## 2024-01-20 NOTE — Patient Instructions (Signed)
-   As we discussed, continue mirtazapine  7.5 mg nightly until you receive a supply of lexapro . Once the lexapro  is received, take lexapro  5 mg daily for 1 week and then INCREASE to 10 mg daily. Stop mirtazapine  once lexapro  is started.  - Let me know if you have any issues obtaining your medications.      Follow-up instructions:  - Please continue taking your medications as prescribed for your mental health.   - Do not make changes to your medications, including taking more or less than prescribed, unless under the supervision of your physician. Be aware that some medications may make you feel worse if abruptly stopped  - Please refrain from using illicit substances, as these can affect your mood and could cause anxiety or other concerning symptoms.   - Seek further medical care for any increase in symptoms or new symptoms such as thoughts of wanting to hurt yourself or hurt others.     Contact info:  Life-threatening emergencies: Call 911, the 988 suicide and crisis lifeline, or go to the nearest ER for medical or psychiatric attention.           Issues that need urgent attention but are not life threatening: Call the clinic outpatient front desk for assistance:    - [phone] for [facility] clinics located at [address] [facility]  - [phone] for [facility] child and adolescent psychiatry clinics located at [address]  - [phone] for [facility] and adult psychiatry clinics located at [address] [address] [facility]    Non-urgent routine concerns and questions: Send a message through [facility] or call our clinic front desk.    Refill requests: Check with your pharmacy to initiate refill requests.    Regarding appointments:  - If you need to cancel your appointment, we ask that you call your clinic at least 24 hours before your scheduled appointment at the numbers listed above.  - If for any reason you arrive 15 minutes later than your scheduled appointment time, you may not be seen and your visit may be rescheduled.  - Please remember that we will not automatically reschedule missed appointments.  - If you miss two (2) appointments without letting us  know in advance, you will likely be referred to a provider in your community.  - We will do our best to be on time. Sometimes an emergency will arise that might cause your clinician to be late. We will try to inform you of this when you check in for your appointment. If you wait more than 15 minutes past your appointment time without such notice, please speak with the front desk staff.    In the event of bad weather, the clinic staff will attempt to contact you, should your appointment need to be rescheduled. Additionally, you can call the Patient Weather Line [phone] for system-wide clinic status    For more information and reminders regarding clinic policies (these were provided when you were admitted to the clinic), please ask the front desk.

## 2024-01-20 NOTE — Progress Notes (Signed)
 This was a telehealth service and I was in person with the resident.  As the attending physician, I spent 5 minutes in medical discussion with the patient via real-time audio and video, participating in the key portions of the service.  I spent an additional 10 minutes on pre- and post-visit activities which were specific to the patient and included reviewing the patient???s medical records, lab results, imaging results, and other pertinent records. I reviewed the resident's note and I agree with the resident's findings and plan.    Cloria Spring, MD

## 2024-01-22 MED ORDER — METRONIDAZOLE 0.75 % (37.5 MG/5 GRAM) VAGINAL GEL
Freq: Every evening | VAGINAL | 0 refills | 5.00000 days | Status: CP
Start: 2024-01-22 — End: 2024-01-27

## 2024-01-22 NOTE — Progress Notes (Signed)
 [facility] Clinical Assessment & Refill Coordination Note    Elaine Koch, DOB: [birth year]  Phone: There are no phone numbers on file.    All above HIPAA information was verified with patient.     Was a nurse, learning disability used for this call? 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    Medication list has been reviewed and updated in Epic: Yes  2 high priority alerts noted on med rec:  Interaction envarsus  and prn esgic  (not as [facility]) - clinic notified  Duplication lexapro  - pt verified this comes from local pharmacy not [facility], messaged clinic team to dc duplicate    Allergies[2]    Changes to allergies: No    Allergies have been reviewed and updated in Epic: Yes    SPECIALTY MEDICATION ADHERENCE     Envarsus  4mg   : 45 days of medicine on hand   Mycophenolate  180mg   : 7 days of medicine on hand     Medication Adherence    Patient reported X missed doses in the last month: 0  Specialty Medication: envarsus  4mg   Patient is on additional specialty medications: Yes  Additional Specialty Medications: Mycophenolate  180mg   Patient Reported Additional Medication X Missed Doses in the Last Month: 0  Patient is on more than two specialty medications: No          Specialty medication(s) dose(s) confirmed: Regimen is correct and unchanged.     Are there any concerns with adherence? No    Adherence counseling provided? Not needed    CLINICAL MANAGEMENT AND INTERVENTION      Clinical Benefit Assessment:    Do you feel the medicine is effective or helping your condition? Yes    Clinical Benefit counseling provided? Not needed    Adverse Effects Assessment:    Are you experiencing any side effects? No    Are you experiencing difficulty administering your medicine? No    Quality of Life Assessment:    Quality of Life    Rheumatology  Oncology  Dermatology  Cystic Fibrosis          How many days over the past month did your transplant  keep you from your normal activities? For example, brushing your teeth or getting up in the morning. 0    Have you discussed this with your provider? Not needed    Acute Infection Status:    Acute infections noted within Epic:  No active infections    Patient reported infection: None    Therapy Appropriateness:    Is the medication and dose appropriate considering the patient???s diagnosis, treatment, and disease journey, comorbidities, medical history, current medications, allergies, therapeutic goals, self-administration ability, and access barriers? Yes, therapy is appropriate and should be continued     Clinical Intervention:    Was an intervention completed as part of this clinical assessment? No    DISEASE/MEDICATION-SPECIFIC INFORMATION      N/A    Solid Organ Transplant: Not Applicable    PATIENT SPECIFIC NEEDS     Does the patient have any physical, cognitive, or cultural barriers? No    Is the patient high risk? Yes, patient is taking a REMS drug. Medication is dispensed in compliance with REMS program    Does the patient require physician intervention or other additional services (i.e., nutrition, smoking cessation,  social work)? No    Does the patient have an additional or emergency contact listed in their chart? Yes    SOCIAL DETERMINANTS OF HEALTH     At the [facility], we have learned that life circumstances - like trouble affording food, housing, utilities, or transportation can affect the health of many of our patients.   That is why we wanted to ask: are you currently experiencing any life circumstances that are negatively impacting your health and/or quality of life? Patient declined to answer    Social Drivers of Health     Food Insecurity: No Food Insecurity (04/07/2023)    Hunger Vital Sign     Worried About Running Out of Food in the Last Year: Never true     Ran Out of Food in the Last Year: Never true   Tobacco Use: Low Risk  (10/06/2023)    Received from [facility]    Patient History     Smoking Tobacco Use: Never     Smokeless Tobacco Use: Never     Passive Exposure: Not on file   Transportation Needs: No Transportation Needs (04/07/2023)    PRAPARE - Transportation     Lack of Transportation (Medical): No     Lack of Transportation (Non-Medical): No   Alcohol Use: Not At Risk (04/24/2023)    Alcohol Use     How often do you have a drink containing alcohol?: Never     How many drinks containing alcohol do you have on a typical day when you are drinking?: Not on file     How often do you have 5 or more drinks on one occasion?: Never   Housing: Low Risk (04/07/2023)    Housing     Within the past 12 months, have you ever stayed: outside, in a car, in a tent, in an overnight shelter, or temporarily in someone else's home (i.e. couch-surfing)?: No     Are you worried about losing your housing?: No   Physical Activity: Not on file   Utilities: Low Risk (04/07/2023)    Utilities     Within the past 12 months, have you been unable to get utilities (heat, electricity) when it was really needed?: No   Stress: Not on file   Interpersonal Safety: Not At Risk (04/24/2023)    Interpersonal Safety     Unsafe Where You Currently Live: No     Physically Hurt by Anyone: No     Abused by Anyone: No   Substance Use: Not on file (12/16/2022)   Intimate Partner Violence: Not At Risk (04/24/2023)    Humiliation, Afraid, Rape, and Kick questionnaire     Fear of Current or Ex-Partner: No     Emotionally Abused: No     Physically Abused: No     Sexually Abused: No   Social Connections: Not on file   Financial Resource Strain: Low Risk (04/07/2023)    Overall Financial Resource Strain (CARDIA)     Difficulty of Paying Living Expenses: Not hard at all   Health Literacy: Low Risk (04/24/2023)    Health Literacy     : Never   Internet Connectivity: Not on file       Would you be willing to receive help with any of the needs that you have identified today? Not applicable       SHIPPING     Specialty Medication(s) to be Shipped:   Transplant: mycophenolic acid 180mg     Other medication(s) to  be shipped: No additional medications requested for fill at this time    Specialty Medications not needed at this time: Transplant: Envarsus  4mg   Patient aware next [facility] outbound call is set up for 1 month out but to contact [facility] with any needs sooner     Changes to insurance: No    Cost and Payment: Patient has a $0 copay, payment information is not required.    Delivery Scheduled: Yes, Expected medication delivery date: 01/26/2024.     Medication will be delivered via UPS to the confirmed prescription address in [facility].    The patient will receive a drug information handout for each medication shipped and additional FDA Medication Guides as required.  Verified that patient has previously received a Conservation Officer, Historic Buildings and a Surveyor, Mining.    The patient or caregiver noted above participated in the development of this care plan and knows that they can request review of or adjustments to the care plan at any time.      All of the patient's questions and concerns have been addressed.    Ronal FORBES Reusing, PharmD   [facility] Specialty Pharmacist       [1]   Current Outpatient Medications   Medication Sig Dispense Refill    butalbital -acetaminophen -caffeine  (ESGIC ) 50-325-40 mg per tablet Take 1 tablet by mouth every four (4) hours as needed for pain. (Patient not taking: Reported on 08/14/2023) 30 tablet 2    cetirizine  (ZYRTEC ) 10 MG tablet Take 1 tablet (10 mg total) by mouth daily. 30 tablet 2    escitalopram  oxalate (LEXAPRO ) 10 MG tablet Take 1 tablet (10 mg total) by mouth daily. 90 tablet 0    escitalopram  oxalate (LEXAPRO ) 10 MG tablet Take 1 tablet (10 mg total) by mouth daily. 90 tablet 0    fluticasone  propionate (FLONASE ) 50 mcg/actuation nasal spray 2 sprays into each nostril daily. 32 g 11    folic acid  (FOLVITE ) 1 MG tablet Take 1 tablet (1 mg total) by mouth daily. 30 tablet 11    hydrocortisone  2.5 % cream Apply topically two (2) times a day. (Patient not taking: Reported on 08/14/2023) 30 g 0    loperamide  (IMODIUM  A-D) 2 mg tablet Take 1 tablet (2 mg total) by mouth four (4) times a day as needed for diarrhea. 60 tablet 2    metroNIDAZOLE  (METROGEL ) 0.75 % (37.5mg /5 gram) vaginal gel Insert into the vagina nightly for 5 days. (1 applicatorful each dose) 70 g 0    mycophenolate  (MYFORTIC ) 180 MG EC tablet Take 2 tablets (360 mg total) by mouth two (2) times a day. 360 tablet 3    sodium bicarbonate  650 mg tablet Take 2 tablets (1,300 mg total) by mouth two (2) times a day. 400 tablet 3    tacrolimus  (ENVARSUS  XR) 4 mg Tb24 extended release tablet Take 4 tablets (16 mg total) by mouth in the morning. Take in addition to 1 mg tablets for a total daily dose of 19 mg. 360 tablet 3     No current facility-administered medications for this visit.   [2]   Allergies  Allergen Reactions    Iodinated Contrast Media Swelling, Rash and Other (See Comments)     Burning, warmth throughout body and tingling.  Throat swelling.  Treated with benadryl  and symptoms improved.  Has not had contrast since then (2013 or 2014).    Burning    Nickel Rash    Privigen  [Immun Glob G(Igg)-Pro-Iga  0-50] Muscle Pain     PRIVIGEN  brand: back and leg pain at usual rate of infusion during first dose, then full body pain again when rechallenged at slower rate of 30 mL/hr    Propranolol Other (See Comments) and Swelling    Eye Irrigating Solution [Ophthalmic Irrigation Solution] Other (See Comments)     Contrast dye (name?) used in eyes caused hot feeling in face, reversed with Benadryl .     Iodine      Other reaction(s): Skin Rash    Naltrexone Rash    Uni-Cortrom Rash

## 2024-01-23 MED FILL — MYCOPHENOLATE SODIUM 180 MG TABLET,DELAYED RELEASE: ORAL | 90 days supply | Qty: 360 | Fill #4

## 2024-02-02 NOTE — Progress Notes (Signed)
 [facility] received a request from Dr. Debarah to remove this patient from the Diabetes registry.     Chart review was performed. Patient was included in Diabetes registry due to encounter diagnoses: 05/30/2022, 02/20/2023, 03/14/2023 for Diabetes in the past 18 months.     This patient was removed from the registry until 09/10/2024, when the patient will be reevaluated for inclusion into Diabetes registry.     Original Request:    Department Requesting Removal: [facility]   User Requesting Removal: Redell GORMAN Debarah, MD   Registry to be Removed From (only registries listed can be assessed for removal): Diabetes   Reason the patient should be removed from the above registry: no longer relevant     If you have further questions, please contact Olam Seeds, RN in Insurance Account Manager.

## 2024-02-12 DIAGNOSIS — D708 Other neutropenia: Principal | ICD-10-CM

## 2024-02-13 ENCOUNTER — Ambulatory Visit: Admit: 2024-02-13 | Discharge: 2024-02-14 | Payer: MEDICARE

## 2024-02-13 ENCOUNTER — Ambulatory Visit: Admit: 2024-02-13 | Discharge: 2024-02-14 | Payer: MEDICARE | Attending: Nephrology | Primary: Nephrology

## 2024-02-13 DIAGNOSIS — Z940 Kidney transplant status: Principal | ICD-10-CM

## 2024-02-13 DIAGNOSIS — R7689 Donor specific antibody (DSA) positive: Principal | ICD-10-CM

## 2024-02-13 DIAGNOSIS — N939 Abnormal uterine and vaginal bleeding, unspecified: Principal | ICD-10-CM

## 2024-02-13 DIAGNOSIS — D84821 Immunocompromised state due to drug therapy (HHS-HCC): Principal | ICD-10-CM

## 2024-02-13 DIAGNOSIS — F32A Depression, unspecified depression type: Principal | ICD-10-CM

## 2024-02-13 DIAGNOSIS — R197 Diarrhea, unspecified: Principal | ICD-10-CM

## 2024-02-13 DIAGNOSIS — N1832 Anemia in stage 3b chronic kidney disease (CMS-HCC): Principal | ICD-10-CM

## 2024-02-13 DIAGNOSIS — D631 Anemia in chronic kidney disease: Principal | ICD-10-CM

## 2024-02-13 DIAGNOSIS — Z79899 Other long term (current) drug therapy: Principal | ICD-10-CM

## 2024-02-13 DIAGNOSIS — Z9483 Pancreas transplant status: Principal | ICD-10-CM

## 2024-02-13 DIAGNOSIS — E119 Type 2 diabetes mellitus without complications: Principal | ICD-10-CM

## 2024-02-13 LAB — CBC W/ AUTO DIFF
BASOPHILS ABSOLUTE COUNT: 0 10*9/L (ref 0.0–0.1)
BASOPHILS RELATIVE PERCENT: 0.9 %
EOSINOPHILS ABSOLUTE COUNT: 0.3 10*9/L (ref 0.0–0.5)
EOSINOPHILS RELATIVE PERCENT: 5.9 %
HEMATOCRIT: 34.2 % (ref 34.0–44.0)
HEMOGLOBIN: 11.6 g/dL (ref 11.3–14.9)
LYMPHOCYTES ABSOLUTE COUNT: 1.1 10*9/L (ref 1.1–3.6)
LYMPHOCYTES RELATIVE PERCENT: 20.7 %
MEAN CORPUSCULAR HEMOGLOBIN CONC: 34 g/dL (ref 32.0–36.0)
MEAN CORPUSCULAR HEMOGLOBIN: 32.2 pg (ref 25.9–32.4)
MEAN CORPUSCULAR VOLUME: 94.8 fL (ref 77.6–95.7)
MEAN PLATELET VOLUME: 8.3 fL (ref 6.8–10.7)
MONOCYTES ABSOLUTE COUNT: 0.3 10*9/L (ref 0.3–0.8)
MONOCYTES RELATIVE PERCENT: 5.8 %
NEUTROPHILS ABSOLUTE COUNT: 3.5 10*9/L (ref 1.8–7.8)
NEUTROPHILS RELATIVE PERCENT: 66.7 %
PLATELET COUNT: 181 10*9/L (ref 150–450)
RED BLOOD CELL COUNT: 3.61 10*12/L — ABNORMAL LOW (ref 3.95–5.13)
RED CELL DISTRIBUTION WIDTH: 13.3 % (ref 12.2–15.2)
WBC ADJUSTED: 5.2 10*9/L (ref 3.6–11.2)

## 2024-02-13 LAB — CMV DNA, QUANTITATIVE, PCR: CMV VIRAL LD: NOT DETECTED

## 2024-02-13 LAB — BK VIRUS QUANTITATIVE PCR, BLOOD: BK BLOOD RESULT: NOT DETECTED

## 2024-02-13 LAB — URINALYSIS WITH MICROSCOPY
BACTERIA: NONE SEEN /HPF
BILIRUBIN UA: NEGATIVE
BLOOD UA: NEGATIVE
GLUCOSE UA: NEGATIVE
KETONES UA: NEGATIVE
LEUKOCYTE ESTERASE UA: NEGATIVE
NITRITE UA: NEGATIVE
PH UA: 7 (ref 5.0–9.0)
PROTEIN UA: NEGATIVE
RBC UA: 1 /HPF (ref <=4)
SPECIFIC GRAVITY UA: 1.017 (ref 1.003–1.030)
SQUAMOUS EPITHELIAL: <1 /HPF (ref 0–5)
UROBILINOGEN UA: <2
WBC UA: <1 /HPF (ref 0–5)

## 2024-02-13 LAB — IRON & TIBC
IRON SATURATION: 30 % (ref 20–55)
IRON: 75 ug/dL (ref 50–170)
TOTAL IRON BINDING CAPACITY: 254 ug/dL (ref 250–425)

## 2024-02-13 LAB — RENAL FUNCTION PANEL
ALBUMIN: 3.8 g/dL (ref 3.4–5.0)
ANION GAP: 12 mmol/L (ref 5–14)
BLOOD UREA NITROGEN: 30 mg/dL — ABNORMAL HIGH (ref 9–23)
BUN / CREAT RATIO: 14
CALCIUM: 9.5 mg/dL (ref 8.7–10.4)
CHLORIDE: 107 mmol/L (ref 98–107)
CO2: 24.1 mmol/L (ref 20.0–31.0)
CREATININE: 2.2 mg/dL — ABNORMAL HIGH (ref 0.55–1.02)
EGFR CKD-EPI (2021) FEMALE: 29 mL/min/1.73m2 — ABNORMAL LOW (ref >=60)
GLUCOSE RANDOM: 91 mg/dL (ref 70–99)
PHOSPHORUS: 3.6 mg/dL (ref 2.4–5.1)
POTASSIUM: 5 mmol/L — ABNORMAL HIGH (ref 3.4–4.8)
SODIUM: 143 mmol/L (ref 135–145)

## 2024-02-13 LAB — FERRITIN: FERRITIN: 274.3 ng/mL — ABNORMAL HIGH (ref 30.0–270.7)

## 2024-02-13 LAB — MAGNESIUM: MAGNESIUM: 1.8 mg/dL (ref 1.6–2.6)

## 2024-02-13 LAB — FACTOR VIII (VWF PANEL): FACTOR VIII ACTIVITY: 130 % (ref 56.0–186.0)

## 2024-02-13 LAB — ALBUMIN / CREATININE URINE RATIO
ALBUMIN QUANT URINE: 2.9 mg/dL
ALBUMIN/CREATININE RATIO: 34.5 ug/mg — ABNORMAL HIGH (ref 0.0–30.0)
CREATININE, URINE: 84.1 mg/dL

## 2024-02-13 LAB — HEMOGLOBIN A1C
ESTIMATED AVERAGE GLUCOSE: 100 mg/dL
HEMOGLOBIN A1C: 5.1 % (ref 4.8–5.6)

## 2024-02-13 LAB — PROTEIN / CREATININE RATIO, URINE
CREATININE, URINE: 84.1 mg/dL
PROTEIN URINE: 26.4 mg/dL
PROTEIN/CREAT RATIO, URINE: 0.314

## 2024-02-13 MED ORDER — AMLODIPINE 5 MG TABLET
ORAL_TABLET | Freq: Every day | ORAL | 3 refills | 100.00000 days | Status: CP
Start: 2024-02-13 — End: 2025-03-19
  Filled 2024-02-17: qty 90, 90d supply, fill #0

## 2024-02-13 NOTE — Progress Notes (Addendum)
 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 Creatinine stable today. Her DSAs today are negative. Will continue to follow for now. Reinforced importance of regular labs.    With regards to her pancreas transplant, her amylase had been chronically elevated, but was normal 12/13/22. Not sent today, will get with next labs. A1c is reassuring.    # Immunosuppression  Lab Results   Component Value Date    TACROLIMUS  5.4 02/13/2024     Tacrolimus : Her tacrolimus  level today was not an accurate trough, probably closer to 29-30 hour level. Given her pancreas transplant, her goal is closer to 8-10. Will continue current dose and follow up next level.  Azathioprine  100 mg daily. Not on MMF due to previous desire for pregnancy, though could consider going back while she has the IUD in place.   Prednisone  free.    # Intermittent watery stools  Have given her supplies to collect a stool sample. Has had Yersinia in her stool previously.    # Bacterial vaginosis  # Dysfunctional uterine bleeding  Vaginal bleeding has resolved with IUD placement, BV manageable with prn metronidazole  gel.    # Depression/anxiety  Stable on lexapro , mood improved with new job and not having to commute so far. Continue to encourage establishing with local mental health.    # Metabolic acidosis  Lab Results   Component Value Date    CO2 24.1 02/13/2024     Stable on sodium bicarbonate  1300 mg twice daily.    # BP management  BP: (!) 181/93 in clinic today. She has not required antihypertensives previously, will start amlodipine  5 mg daily.    # Infectious disease  CMV PCR:   Lab Results   Component Value Date    CMVLR Not Detected 02/13/2024     BK PCR:   Lab Results   Component Value Date    BBKQR Not Detected 02/13/2024     EBV PCR: Not Detected 05/30/22     # Anemia screening  Lab Results   Component Value Date    HGB 11.6 02/13/2024     Lab Results   Component Value Date    IRON  75 02/13/2024    TIBC 254 02/13/2024    LABIRON 30 02/13/2024     Hgb much improved from prior, likely from resolution of vaginal bleeding.    # Cardiovascular  The ASCVD Risk score (Arnett DK, et al., 2019) failed to calculate..  Lab Results   Component Value Date    CHOL 87 12/13/2022    HDL 50 12/13/2022    LDL 30 (L) 12/13/2022     Lab Results   Component Value Date    TRIG 36 12/13/2022  Lipid panel at goal, continue to monitor.    # Bone and mineral metabolism  Lab Results   Component Value Date    CALCIUM  9.5 02/13/2024      Lab Results   Component Value Date    PHOS 3.6 02/13/2024     Lab Results   Component Value Date    PTH 211.9 (H) 12/13/2022     Lab Results   Component Value Date    VITDTOTAL 25.2 12/13/2022     Stable, no changes.    # Electrolytes  Lab Results   Component Value Date    MG 1.8 02/13/2024     Lab Results   Component Value Date    K 5.0 (H) 02/13/2024     Stable, no changes.    # Immunizations  Last Covid Booster: Completed - Date: 03/23/20  Flu Shot: Completed - Date: 12/02/23  Pneumococcal: Prevnar-20 on 12/13/22  Shingrix: Needed age 30    # Cancer screening  PAP smear: completed - date: 02/06/21  Mammogram: N/A  Colonoscopy: N/A  Skin: recommend yearly dermatology evaluation  Renal US : completed - date: 04/24/22  CXR: completed - date: 12/12/22    # Childbearing age  IUD in place.    Most pre-menopausal women experience return of fertility post-transplant, and a safe pregnancy is possible with careful planning. Recommend using abstinence or a highly effective contraception to avoid pregnancy until planning pregnancy, and if planning pregnancy please notify nephrology so that we can adjust medications to avoid teratogens.      There are no Patient Instructions on file for this visit.      Return in about 4 months (around 06/12/2024).    ___________________________________________________________________      Kidney Transplant History:   Date of Transplant: 05/03/2019 (Kidney / Pancreas)  Type of Transplant: Deceased  KDPI: 34%  Native Kidney Disease: Type 1 Diabetes  Prior Transplants: None    Biopsies:   Zero-Hour Biopsy: Slim biopsy core without characteristic and diagnostic abnormalities.  03/02/2021: Minor morphologic changes.  08/16/2022: Limited cortex available for evaluation; Mild active antibody mediated rejection; Mild glomerulitis; Mild peritubular capillaritis; 1+ C4d peritubular capillary staining (<5%); Banff Score (2919): i0, t0, v0, g1, ptc1, C4d1, ci0, ct0, cv3, cg0, ptcml0, ti0, i-IFTA0, t-IFTA0, pvl0.    Donor Specific Antibodies:   To C17, first on 11/26/19 at 1226, peak 3268 on 10/11/21, 1376 on 08/01/22, 1124 on 12/13/22.  To DR51, first/peak on 06/07/20 at 1330, 1041 on 08/01/22 not present 12/13/22.    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 Had some low pressures as an outpatient, blood pressure meds held and was on midodrine  for a time. Transitioned to Envarsus  given high tacrolimus  requirement.  - Admitted 6/1-07/16/19 with HA/N/V/D. Had AKI with tacrolimus  level of 27.9. Had IVF, found to be C.diff positive and started on vancomycin .   - On 07/20/19 noted to have wbc < 0.6, Myfortic  and Valcyte  held, given Granix . Up to 3.9 on 07/26/19, Myfortic  restarted at 180 mg BID (was 720 mg prior to holding).  - Diarrhea returned on 07/28/19, treated again with vanc for 14 days.  - Continued to have diarrhea, referred for FMT. Had been treated with  fidaxomicin  and bezlotoxumab .  - Admitted 8/20-8/22/21 for Enterococcal UTI.  - Had fecal transplant 02/14/20. Called 02/17/20 feeling poorly, admitted 1/6-1/11/22 with AKI and elevated tac level. Had proteus in urine and blood, was Covid positive as well. Treated with cefipime then then cipro , also get remdesivir . Myfortic  and Valcyte  held on discharge for leukopenia. Myfortic  restarted 03/03/20.  - She unfortunately had recurrence of her diarrhea which did not respond to further oral therapy, had repeat FMT on 06/19/20.  - At her visit 09/01/20 she was interested in pursuing pregnancy but knew that she could not be on Myfortic . Planned to have her see MFM for a consult prior to transitioning from Myfortic  to azathioprine . She called 10/30/20 to report a positive pregnancy test. We stopped Myfortic  and started azathioprine . She was seen in the ED 11/02/20 for vaginal bleeding, was seen by OB and felt to most likely be experiencing a miscarriage. Beta HCG was 14,771. On 11/08/20 was having ongoing bleeding and clots. She presented again to [facility] 11/17/20 with lower abdominal pain and bleeding. US  showed no clear IUP and bHCG was down to 7672. Underwent D&C and subsequent diagnostic laparoscopy to rule out ectopic pregnancy, fallopian tubes were normal. Hgb dropped from 11 to 9. Creatinine remained at baseline. She was to get follow up bHCG following D&C but they were unable to reach her to get that done.  [place] Sous to ED at [facility] 01/29/21 where she was found to have a bHCG of 190,000. US  showed pregnancy at [redacted]w[redacted]d. She was able to tolerate po so was discharged home, creatinine was 1.53. She was seen by MFM 02/06/21. She did not have any labs between 01/29/21 and 02/23/21, creatinine from 1.53 to 1.73. Repeated labs on 02/28/21 and creatinine remained mildly elevated from baseline at 1.65. Since it was still early in pregnancy, we decided to biopsy her to rule out rejection before she was too progressed in her pregnancy. She underwent biopsy 03/02/21 that showed only minor morphological changes, no rejection.  -  She saw MFM and Dr. Germaine 03/06/21 with plans to start epogen for low Hgb. Wanted to continue the pregnancy at that time, however called 03/12/21 asking to proceed with termination, due to concerns for affects of pregnancy on her kidney as well as having delivered previous pregnancy at 27 weeks. Underwent uterine vacuum aspiration on 03/20/21.   - At her visit 10/11/21 she had not been getting regular labs, was working as a leisure centre manager. Was not trying to prevent pregnancy, was open to conceiving.  - Seen locally 11/22/21 for URI symptoms, Covid and strep negative. Not tested for RSV or flu that I can see. Recommended symptomatic treatment. Seen again 01/07/22 and 01/10/22 for ongoing cough, felt from GERD so protonix  started.  - Noted to have wbc 1.9 on 01/09/22, planned for Granix  at her appointment 01/11/22, however she cancelled that appointment stating that she was too sick to come to clinic. She ultimately had a dose on 01/24/22.  - Was seen in the ED 02/01/22 with 3 days of N/V. Given IVF and treated for possible UTI with cefdinir . Urine culture grew 50-100K CNS, GC and chlamydia negative, pregnancy negative.  - She did not connect for video visits 03/21/22 or 04/03/22.  - Presented to [facility] ED 04/06/22 with N/V. Treated for presumed pneumonia with amoxicillin, given Zofran . Symptoms improved but she experienced diarrhea and presented to ED 04/24/22 for concerns of C.diff. Renal US  and CT abdomen unremarkable. She did not have a BM while in the  ED for ~ 12 hours so discharged with a stool kit to collect a specimen. Seen at [facility] ED 05/03/22 for ongoing diarrhea, had not submitted stool sample. There a rapid C.diff was negative, given IVF and discharged on Bentyl  and Imodium .   - Called on call coordinator 05/04/22 complaining of ongoing abdominal cramping and diarrhea, somewhat frustrated that no one could explain her symptoms. Placed e-consult for GI, when they reached out for a history, they were unable to reach her.  - Her coordinator reached out 05/06/22 and patient stated diarrhea was better and she was not needing Imodium . Was offered an appointment with me, patient declined and preferred to see GI locally. Called 05/14/22 stating she was having extreme stomach cramps. Again declined appointment with me, asked to be admitted. Was advised to come to ED.  - Admitted from 4/2-05/16/22. Noted that on 05/03/22 at [facility], her stool sample was positive for enteropathogenic E.coli. Was started on IV Cipro  empirically, but repeat GIPP returned negative. She did not have further diarrhea while in house. GI referral made. Was also treated with Flagyl  for BV. Noted to have new eosinophilia, ddx includes eosinophilic colitis from azathioprine .  - At her visit 05/30/22 she continued to have GI symptoms. She felt her symptoms were good for a while after her second fecal transplant, but around the end of February/early March she was put on antibiotics for pneumonia at [facility] and had GI symptoms ever since. Noted a lot of abdominal bloating. Would have 8-10 loose bowel movements at night about 1-2 times a week. Had pretty extreme abdominal cramping and burning, last 1-1.5 hours, did not change with eating or having a bowel movement. She reported that prior to kidney transplant, she had some irritable bowel symptoms, diarrhea, and sensitivity to certain foods.  - Was seen in the ED locally on 07/02/2022 to try to get established with outpatient mental health services. She reported over the previous 2 to 3 weeks an increase in her depression and anxiety. She was finding it difficult to go to work and was restless, fatigued with decreased appetite and decreased motivation. She denied suicidal ideation or plans. She was discharged with plans to follow-up with outpatient services. Our transplant psychologist contacted her 07/03/2022 and assisted her with some resources on finding mental health providers in her area as well as setting up an appointment with her on 07/12/2022. At that visit she reported that she had been engaging in group therapy through [facility] and that was really helpful for her.  She had also started Prozac. She met with our psychologist again this morning.  - At her visit 08/01/22 her GI symptoms were improved. She had switched from Prozac to lexapro  with improvement in her mood, but more daytime fatigue. Had stopped working to focus on her health.  - Her creatinine had been running 1.5-1.7, early in 2024 started to rise into the range of 1.8-2.1. On 08/01/22 was up to 2.4, so planned for renal biopsy. Underwent biopsy in US  08/08/22 complicated by bleeding, were unable to get an adequate specimen.she was admitted and the biopsy was subsequently done in vascular radiology. That showed mild active antibody mediated rejection, C4d positive, with peritubular capillaritis and glomerulitis. Plan was for Solu-Medrol  500 mg IV for 3 days, IVIG 1 g/kg for 2 days and 1 g of rituximab . Unfortunately, she did not tolerate IVIG due to diffuse myalgias, even with premedication. She did fine with rituximab . She was discharged with Bactrim  and Valcyte . Her creatinine was 2.31  at the time of discharge.  - She did meet with Waddell Meissner, transplant psychologist on 08/07/22, was a no-show for her visit with him on 08/23/22.  Did connect on 08/26/22, was a no-show on 10/09/22.  - Got dose of Aranesp  as an outpatient on 08/30/22.  - Had a telemedicine visit with gastroenterology on 10/23/22, reported intermittent abdominal symptoms with watery diarrhea, abdominal pain and bloating. It would occur for a couple of days and then stop completely. Happened every couple of weeks. 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 difficile recurrence, microscopic colitis or medication induced colitis, celiac or alpha gal. Recommended stool testing and blood work. Also recommended L-glutamine supplement 5 g 3 times daily and Imodium  as needed.  - Called her coordinator 11/03/22 reporting positive home pregnancy test. She was also having some vaginal spotting and diarrhea. She presented to the ED for evaluation. She had an ultrasound that demonstrated intrauterine pregnancy with findings concerning for early pregnancy loss. OB was consulted by phone who did not have additional recommendations and that she should follow-up for repeat ultrasound in 10 days. She saw them on 11/18/22 where ultrasound showed nonviable first trimester pregnancy. There was no fetal heart rate and no yolk sac. They discussed treatment options, and patient expressed desire for D&C. Underwent the procedure on 11/19/22. She called the on-call coordinator on 11/24/22 noted abdominal cramping and bleeding. The coordinator paged the on-call for GYN to contact the patient. On 12/02/22 she was noted to have a hemoglobin of 6.6, down from 8.1 on 11/19/2022. We contacted the patient and she reported fatigue and a terrible headache. She was referred to the emergency department. She was no longer having vaginal bleeding at that time, hemoglobin 7.1 there. She received 1 unit of packed red blood cells and was discharged.  - She was seen at the ED at [facility] on 12/12/22 with low back pain and bilateral leg pain for 2 days. Also had a headache. She had a white blood cell count of 1.8, hemoglobin 7.9, creatinine 2.02. COVID/flu/RSV were negative. Was noted to have a pregnancy test that was still positive, with quantitative hCG of 482.  - She canceled appointments with me on 7/26 and 8/16, was a no-show on 10/25/22.  - At visit 12/13/22 was only having vaginal spotting. Noted frontal headaches daily, between 8 am and 1 pm. Was using Benadryl , Flonase  and Tylenol  to manage. Continued to have chronic abdominal symptoms of mostly diarrhea and bloating. Had not done labs or stool studies GI had requested, or started L-glutamine. Was not on Lexapro , had not been able to get a refill so I sent it in.  - [facility] ED 12/24/22 with 2 weeks of intermittent vaginal bleeding, worsening over the previous weekend. Transvaginal US  showed Complex heterogeneous focus in the uterine fundus contiguous with the endometrium, new since September may represent hemorrhage or retained products of conception. Got ceftriaxone , given azithromycin  and Flagyl  with plans for close follow up with GYN. Had video visit with them 12/25/22, denied bleeding at that time, planned follow up HCG the next day. Finally got on 12/30/22, was 27.1 from 52.3, wanted repeat in one week. Seen 01/06/23 and was down to 15.7. She reported ongoing vaginal discharge in spite of treatment for BV. Collected swab and metrogel  called in 01/15/23.   - Transplant psychology 11/19 and 01/22/23, 02/19/23 with plans to treat her until she was able to transition to local mental health care. At visit 02/19/23 had  not yet reached out to [facility] psychiatry.  - ED 01/31/23 with 2 days of F/C, cough, body aches, lower abdominal pain, dysuria. Temp there to 101.7. Admitted 12/20 - 02/03/23 and treated for possible PID with dose ceftriaxone , flagyl  for BV and Keflex  for UTI (grew 50-100K group b Strep). Creatinine up to 2.31, was 2.1 on discharge.    History of Present Illness:     Since the last visit 03/07/2023:  - At visit 03/07/23 she had not had any further vaginal bleeding. Was struggling with ongoing BV symptoms. Some non-productive cough, controlled with tussin. No fever/ST/SOB.  - PCP 03/14/23, increased flagyl  gel to three times per week for BV. Patient wanted to follow up with therapist prior to increasing antidepressants. Did ultimately increase lexapro  from 5 to 10 mg daily.  - Met with transplant psychology (Dr. Oneita) to bridge until able to establish with consistent MH care. Was no show for appointment 3/5 and 05/07/23.  - Admitted 2/23 - 04/10/23 after presenting to the ED with vaginal bleeding. Hgb 4.8, received 3u PRBCs. Given taper of Provera  with improvement. Had diarrhea with GIPP positive for Yersinia, given cipro . Creatinine peaked at 3.43, was 2.98 on discharge and 2.48 on 04/29/23.  - GI 04/16/23, recommended Imodium  and avoiding abx as much as possible.  - PCP 04/24/23, vaginal bleeding had resolved and diarrhea improved, mood was better. Was off azathioprine  (stopped in the hospital due to pancytopenia) but was not provided with any MMF, told to discuss with her provider.  - Started on Myfortic  360 mg BID on 04/29/23. Reported that she was on birth control. Also reported worsening vaginal bleeding, soaking through 2 pads in one hour, diarrhea had returned. Referred to ED. Was seen in ED and Provera  dose was increased. Hgb 7.6.  - GYN 05/01/23 to discuss surgical options for bleeding. IUD inserted at visit 05/07/23. Had endometrial biopsy (benign) and screening for von Willebrand's. Was having ongoing bleeding so Provera  restarted 05/28/23. At visit 05/30/23 bleeding had improved and she never started the Provera .  - ED 10/06/23 for HA and cough, Covid positive. Given Tessalon  and Zofran .  [facility] Psychiatry (Dr. Agapito) 05/20/23 to establish. Has continued to follow with him, last seen 01/20/24. At that visit was having some financial difficulty with the lexapro  and was on mirtazapine  monotherapy. Concerns with weight gain, so plan was to titrate up lexapro  dose and stop mirtazapine  once lexapro  arrived.    In clinic today:  - Cancelled appointments with me 5/28, 8/29, and 11/20/23.  - No labs in our system or in Care Everywhere since 08/19/23.  - Unfortunately she lost her Medicaid and has been unable to get labs locally. She has appealed and thinks she can get it back soon.  - She is working from home now and that has helped her stress greatly.  - Notes that she has missed some evening doses of Myfortic , but has not missed any Envarsus .  - Vaginal bleeding much improved with IUD, now only has occasional spotting. BV has also been much better, she has metronidazole  gel to take as needed.  - Main complaint today is of feeling dehydrated. She is having watery stools, almost immediately after she eats. It is worse the later she eats in the day, especially after 6 or 7 pm. Once the loose stools start they are ongoing throughout the day and she has to take Imodium  to stop them. It doesn't happen every day, but will happen about 4 days out of the week.  No fever, abdominal pain, nausea or vomiting.    Last dose of tacrolimus : 9 am    Concerns about nonadherence: In the past with meds, better recently, does cancel and reschedule appointments frequently.    Social: She lives in [place]. Has a son born in 2015, he is 8. 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     Review of Systems:   All other systems negative, except as documented in the HPI.    Physical Exam:    BP (!) 181/93 (BP Site: L Arm, BP Position: Sitting, BP Cuff Size: Large)  - Pulse 65  - Temp 36.1 ??C (97 ??F) (Temporal)  - Wt 83.6 kg (184 lb 6.4 oz)  - BMI 30.69 kg/m??   General: Patient is a pleasant female in no apparent distress.  Eyes: Sclera anicteric.  ENT: Oropharynx without lesions.   Neck: Supple without LAD/JVD/bruits.  Lungs: Clear to auscultation bilaterally, no wheezes/rales/rhonchi.  Cardiovascular: Regular rate and rhythm without murmurs, rubs or gallops.  Abdomen: Soft, notender/nondistended. Positive bowel sounds. No tenderness over the graft.  Extremities: Without edema, joints without evidence of synovitis.  Skin: Without rash.  Neurological: Grossly nonfocal.  Psychiatric: Somewhat flat affect.      Allergies:   Allergies   Allergen Reactions    Iodinated Contrast Media Swelling, Rash and Other (See Comments)     Burning, warmth throughout body and tingling.  Throat swelling.  Treated with benadryl  and symptoms improved.  Has not had contrast since then (2013 or 2014).    Burning    Nickel Rash    Privigen  [Immun Glob G(Igg)-Pro-Iga 0-50] Muscle Pain     PRIVIGEN  brand: back and leg pain at usual rate of infusion during first dose, then full body pain again when rechallenged at slower rate of 30 mL/hr    Propranolol Other (See Comments) and Swelling    Eye Irrigating Solution [Ophthalmic Irrigation Solution] Other (See Comments)     Contrast dye (name?) used in eyes caused hot feeling in face, reversed with Benadryl .     Iodine      Other reaction(s): Skin Rash    Naltrexone Rash    Uni-Cortrom Rash        Current Medications:   Current Outpatient Medications   Medication Sig Dispense Refill    amlodipine  (NORVASC ) 5 MG tablet Take 1 tablet (5 mg total) by mouth daily. 100 tablet 3    butalbital -acetaminophen -caffeine  (ESGIC ) 50-325-40 mg per tablet Take 1 tablet by mouth every four (4) hours as needed for pain. (Patient not taking: Reported on 08/14/2023) 30 tablet 2    cetirizine  (ZYRTEC ) 10 MG tablet Take 1 tablet (10 mg total) by mouth daily. 30 tablet 2    escitalopram  oxalate (LEXAPRO ) 10 MG tablet Take 1 tablet (10 mg total) by mouth daily. 90 tablet 0    escitalopram  oxalate (LEXAPRO ) 10 MG tablet Take 1 tablet (10 mg total) by mouth daily. 90 tablet 0    fluticasone  propionate (FLONASE ) 50 mcg/actuation nasal spray 2 sprays into each nostril daily. 32 g 11    folic acid  (FOLVITE ) 1 MG tablet Take 1 tablet (1 mg total) by mouth daily. 30 tablet 11    hydrocortisone  2.5 % cream Apply topically two (2) times a day. (Patient not taking: Reported on 08/14/2023) 30 g 0    loperamide  (IMODIUM  A-D) 2 mg tablet Take 1 tablet (2 mg total) by mouth four (4) times a day as needed  for diarrhea. 60 tablet 2    mycophenolate  (MYFORTIC ) 180 MG EC tablet Take 2 tablets (360 mg total) by mouth two (2) times a day. 360 tablet 3    sodium bicarbonate  650 mg tablet Take 2 tablets (1,300 mg total) by mouth two (2) times a day. 400 tablet 3    tacrolimus  (ENVARSUS  XR) 4 mg Tb24 extended release tablet Take 4 tablets (16 mg total) by mouth in the morning. Take in addition to 1 mg tablets for a total daily dose of 19 mg. 360 tablet 3     No current facility-administered medications for this visit.         Laboratory Studies:  Lab Results   Component Value Date    WBC 5.2 02/13/2024    RBC 3.61 (L) 02/13/2024    HGB 11.6 02/13/2024    HCT 34.2 02/13/2024    MCV 94.8 02/13/2024    MCH 32.2 02/13/2024    MCHC 34.0 02/13/2024    RDW 13.3 02/13/2024    PLT 181 02/13/2024    MPV 8.3 02/13/2024     Lab Results   Component Value Date    NA 143 02/13/2024    K 5.0 (H) 02/13/2024    CL 107 02/13/2024    CO2 24.1 02/13/2024    BUN 30 (H) 02/13/2024    CREATININE 2.20 (H) 02/13/2024    GLU 91 02/13/2024    CALCIUM  9.5 02/13/2024    ALBUMIN 3.8 02/13/2024    PHOS 3.6 02/13/2024     Lab Results   Component Value Date    A1C 5.1 02/13/2024           Electronically signed by:   Wonda DELENA BREEN, MD  [facility]        I personally spent 52 minutes face-to-face and non-face-to-face in the care of this patient, which includes all pre, intra and post visit time on the date of service.

## 2024-02-13 NOTE — Progress Notes (Signed)
 Transplant Coordinator, Clinic Visit   Pt seen today by transplant nephrology for follow up, reviewed medications and symptoms.          02/13/24 1338   BP: (!) 181/93   Pulse: 65   Temp: 36.1 ??C (97 ??F)   Weight: 83.6 kg (184 lb 6.4 oz)   PainSc: 0-No pain       Assessment     Not checking BP at home, starting Amlodipine .    Checks blood glucose 100-110    Diarrhea after meals x 1 month.    Good appetite; reports adequate hydration.     Intake: drinking 1 liter of water per day    Immunosuppressant last taken: took Envarsus  yesterday at 1000    Functional Score: 100   Normal no complaints; no evidence of  disease.     I spent a total of 20 minutes with Elaine Koch reviewing medications and symptoms.

## 2024-02-14 LAB — TACROLIMUS LEVEL, TROUGH: TACROLIMUS, TROUGH: 5.4 ng/mL (ref 5.0–15.0)

## 2024-02-16 LAB — VON WILLEBRAND FACTOR PANEL
VON WILLEBRAND AG: 132 %{normal} (ref 50–200)
VON WILLEBRAND FACTOR ACTIVITY: 103 %{normal} (ref 50–200)

## 2024-02-17 MED FILL — ANTI-DIARRHEAL (LOPERAMIDE) 2 MG TABLET: ORAL | 18 days supply | Qty: 72 | Fill #1

## 2024-02-17 MED FILL — SODIUM BICARBONATE 650 MG TABLET: ORAL | 50 days supply | Qty: 200 | Fill #4

## 2024-02-18 NOTE — Progress Notes (Signed)
 [facility] Refill Coordination Note    Elaine Koch, [place]: 01-08-1986  Phone: There are no phone numbers on file.      All above HIPAA information was verified with patient.         02/18/2024    11:37 AM   Specialty Rx Medication Refill Questionnaire   Which Medications would you like refilled and shipped? Envarus   Please list all current allergies: None   Have you missed any doses in the last 30 days? No   Have you had any changes to your medication(s) since your last refill? No   How much of each medication do you have remaining at home? (eg. number of tablets, injections, etc.) About12   Have you experienced any side effects in the last 30 days? No   Please enter the full address (street address, city, state, zip code) where you would like your medication(s) to be delivered to. [address] [place], [place] [address]   Please specify on which day you would like your medication(s) to arrive. Note: if you need your medication(s) within 3 days, please call the pharmacy to schedule your order at [phone]  02/20/2024   Has your insurance changed since your last refill? No   Would you like a pharmacist to call you to discuss your medication(s)? No   Do you require a signature for your package? (Note: if we are billing Medicare Part B or your order contains a controlled substance, we will require a signature) No   I have been provided my out of pocket cost for my medication and approve the pharmacy to charge the amount to my credit card on file. Yes         Completed refill call assessment today to schedule patient's medication shipment from the [facility] [phone]).  All relevant notes have been reviewed.       Confirmed patient received a Conservation Officer, Historic Buildings and a Surveyor, Mining with first shipment. The patient will receive a drug information handout for each medication shipped and additional FDA Medication Guides as required. REFERRAL TO PHARMACIST     Referral to the pharmacist: Not needed      [facility]     Shipping address confirmed in Epic.     Delivery Scheduled: Yes, Expected medication delivery date: 02/20/2024.     Medication will be delivered via UPS to the prescription address in Epic WAM.    Elaine Koch [facility] Specialty Technician

## 2024-02-19 LAB — HLA DS POST TRANSPLANT
ANTI-DONOR DRW #2 MFI: 120 MFI
ANTI-DONOR HLA-A #1 MFI: 21 MFI
ANTI-DONOR HLA-A #2 MFI: 0 MFI
ANTI-DONOR HLA-B #1 MFI: 0 MFI
ANTI-DONOR HLA-B #2 MFI: 0 MFI
ANTI-DONOR HLA-C #2 MFI: 915 MFI
ANTI-DONOR HLA-DQB #1 MFI: 0 MFI
ANTI-DONOR HLA-DQB #2 MFI: 72 MFI
ANTI-DONOR HLA-DR #1 MFI: 133 MFI
ANTI-DONOR HLA-DR #2 MFI: 63 MFI

## 2024-02-19 LAB — FSAB CLASS 1 ANTIBODY SPECIFICITY: HLA CLASS 1 ANTIBODY RESULT: NEGATIVE

## 2024-02-19 LAB — FSAB CLASS 2 ANTIBODY SPECIFICITY: HLA CL2 AB RESULT: NEGATIVE

## 2024-02-19 MED FILL — ENVARSUS XR 4 MG TABLET,EXTENDED RELEASE: ORAL | 90 days supply | Qty: 360 | Fill #3

## 2024-02-25 DIAGNOSIS — Z940 Kidney transplant status: Principal | ICD-10-CM
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
## 2024-03-16 DIAGNOSIS — F339 Major depressive disorder, recurrent, unspecified: Secondary | ICD-10-CM

## 2024-03-16 DIAGNOSIS — F419 Anxiety disorder, unspecified: Secondary | ICD-10-CM

## 2024-03-16 DIAGNOSIS — F439 Reaction to severe stress, unspecified: Principal | ICD-10-CM

## 2024-03-16 MED ORDER — HYDROXYZINE HCL 25 MG TABLET
ORAL_TABLET | Freq: Two times a day (BID) | ORAL | 2 refills | 30.00000 days | Status: CP | PRN
Start: 2024-03-16 — End: 2024-06-14

## 2024-03-16 MED ORDER — ESCITALOPRAM 10 MG TABLET
ORAL_TABLET | Freq: Every day | ORAL | 0 refills | 90.00000 days | Status: CP
Start: 2024-03-16 — End: 2024-06-14

## 2024-03-16 NOTE — Patient Instructions (Addendum)
-   It was great to see you today!  - Please INCREASE lexapro  to 15 mg daily at this time.   - Let me know if you have any questions or concerns.    Follow-up instructions:  - Please continue taking your medications as prescribed for your mental health.   - Do not make changes to your medications, including taking more or less than prescribed, unless under the supervision of your physician. Be aware that some medications may make you feel worse if abruptly stopped  - Please refrain from using illicit substances, as these can affect your mood and could cause anxiety or other concerning symptoms.   - Seek further medical care for any increase in symptoms or new symptoms such as thoughts of wanting to hurt yourself or hurt others.     Contact info:  Life-threatening emergencies: Call 911, the 988 suicide and crisis lifeline, or go to the nearest ER for medical or psychiatric attention.           Issues that need urgent attention but are not life threatening: Call the clinic outpatient front desk for assistance:    - [phone] for [facility] adult psychiatry clinics located at [address] [facility]  - [phone] for [facility] child and adolescent psychiatry clinics located at [address]  - [phone] for [facility] and adult psychiatry clinics located at [address] [facility]    Non-urgent routine concerns and questions: Send a message through [facility] or call our clinic front desk.    Refill requests: Check with your pharmacy to initiate refill requests.    Regarding appointments:  - If you need to cancel your appointment, we ask that you call your clinic at least 24 hours before your scheduled appointment at the numbers listed above.  - If for any reason you arrive 15 minutes later than your scheduled appointment time, you may not be seen and your visit may be rescheduled.  - Please remember that we will not automatically reschedule missed appointments.  - If you miss two (2) appointments without letting us  know in advance, you will likely be referred to a provider in your community.  - We will do our best to be on time. Sometimes an emergency will arise that might cause your clinician to be late. We will try to inform you of this when you check in for your appointment. If you wait more than 15 minutes past your appointment time without such notice, please speak with the front desk staff.    In the event of bad weather, the clinic staff will attempt to contact you, should your appointment need to be rescheduled. Additionally, you can call the Patient Weather Line [phone] for system-wide clinic status    For more information and reminders regarding clinic policies (these were provided when you were admitted to the clinic), please ask the front desk.

## 2024-03-17 NOTE — Progress Notes (Signed)
 I interviewed and examined the patient.  I discussed the case with the resident.  I reviewed the note and agree with the assessment and plan as outlined there.
